# Patient Record
Sex: Male | Born: 1935 | Race: White | Hispanic: No | Smoking: Former smoker
Health system: Southern US, Community
[De-identification: ages and names within clinical notes are randomized; demographics above are authoritative.]

## PROBLEM LIST (undated history)

## (undated) DIAGNOSIS — I998 Other disorder of circulatory system: Secondary | ICD-10-CM

## (undated) DIAGNOSIS — I255 Ischemic cardiomyopathy: Secondary | ICD-10-CM

## (undated) DIAGNOSIS — C3490 Malignant neoplasm of unspecified part of unspecified bronchus or lung: Secondary | ICD-10-CM

## (undated) DIAGNOSIS — C61 Malignant neoplasm of prostate: Secondary | ICD-10-CM

## (undated) DIAGNOSIS — E7800 Pure hypercholesterolemia, unspecified: Secondary | ICD-10-CM

## (undated) DIAGNOSIS — J60 Coalworker's pneumoconiosis: Secondary | ICD-10-CM

## (undated) DIAGNOSIS — I482 Chronic atrial fibrillation, unspecified: Secondary | ICD-10-CM

## (undated) DIAGNOSIS — M1990 Unspecified osteoarthritis, unspecified site: Secondary | ICD-10-CM

## (undated) DIAGNOSIS — I350 Nonrheumatic aortic (valve) stenosis: Secondary | ICD-10-CM

## (undated) DIAGNOSIS — I639 Cerebral infarction, unspecified: Secondary | ICD-10-CM

## (undated) DIAGNOSIS — K922 Gastrointestinal hemorrhage, unspecified: Secondary | ICD-10-CM

## (undated) DIAGNOSIS — I10 Essential (primary) hypertension: Secondary | ICD-10-CM

## (undated) DIAGNOSIS — I6529 Occlusion and stenosis of unspecified carotid artery: Secondary | ICD-10-CM

## (undated) DIAGNOSIS — Z9981 Dependence on supplemental oxygen: Secondary | ICD-10-CM

## (undated) DIAGNOSIS — I2510 Atherosclerotic heart disease of native coronary artery without angina pectoris: Secondary | ICD-10-CM

## (undated) DIAGNOSIS — M109 Gout, unspecified: Secondary | ICD-10-CM

## (undated) DIAGNOSIS — K219 Gastro-esophageal reflux disease without esophagitis: Secondary | ICD-10-CM

## (undated) DIAGNOSIS — R001 Bradycardia, unspecified: Secondary | ICD-10-CM

## (undated) DIAGNOSIS — I5022 Chronic systolic (congestive) heart failure: Secondary | ICD-10-CM

## (undated) DIAGNOSIS — N200 Calculus of kidney: Secondary | ICD-10-CM

## (undated) DIAGNOSIS — J9610 Chronic respiratory failure, unspecified whether with hypoxia or hypercapnia: Secondary | ICD-10-CM

## (undated) DIAGNOSIS — J449 Chronic obstructive pulmonary disease, unspecified: Secondary | ICD-10-CM

## (undated) HISTORY — PX: COLONOSCOPY: SHX174

## (undated) HISTORY — DX: Malignant neoplasm of prostate: C61

## (undated) HISTORY — DX: Occlusion and stenosis of unspecified carotid artery: I65.29

## (undated) HISTORY — PX: INSERT / REPLACE / REMOVE PACEMAKER: SUR710

## (undated) HISTORY — DX: Coalworker's pneumoconiosis: J60

## (undated) HISTORY — DX: Calculus of kidney: N20.0

## (undated) HISTORY — PX: CORONARY ANGIOPLASTY WITH STENT PLACEMENT: SHX49

## (undated) HISTORY — DX: Pure hypercholesterolemia, unspecified: E78.00

## (undated) HISTORY — PX: ESOPHAGOGASTRODUODENOSCOPY: SHX1529

## (undated) HISTORY — PX: CATARACT EXTRACTION W/ INTRAOCULAR LENS  IMPLANT, BILATERAL: SHX1307

---

## 1997-07-19 ENCOUNTER — Observation Stay (HOSPITAL_COMMUNITY): Admission: AD | Admit: 1997-07-19 | Discharge: 1997-07-20 | Payer: Self-pay | Admitting: Cardiology

## 1997-08-26 ENCOUNTER — Other Ambulatory Visit: Admission: RE | Admit: 1997-08-26 | Discharge: 1997-08-26 | Payer: Self-pay | Admitting: Family Medicine

## 1997-09-06 ENCOUNTER — Inpatient Hospital Stay (HOSPITAL_COMMUNITY): Admission: EM | Admit: 1997-09-06 | Discharge: 1997-09-08 | Payer: Self-pay | Admitting: Emergency Medicine

## 1997-09-27 ENCOUNTER — Other Ambulatory Visit: Admission: RE | Admit: 1997-09-27 | Discharge: 1997-09-27 | Payer: Self-pay | Admitting: Family Medicine

## 1998-04-08 ENCOUNTER — Inpatient Hospital Stay (HOSPITAL_COMMUNITY): Admission: EM | Admit: 1998-04-08 | Discharge: 1998-04-12 | Payer: Self-pay | Admitting: *Deleted

## 1998-04-08 ENCOUNTER — Encounter: Payer: Self-pay | Admitting: *Deleted

## 1998-05-02 ENCOUNTER — Ambulatory Visit (HOSPITAL_COMMUNITY): Admission: RE | Admit: 1998-05-02 | Discharge: 1998-05-02 | Payer: Self-pay | Admitting: Cardiology

## 1998-08-16 ENCOUNTER — Ambulatory Visit (HOSPITAL_COMMUNITY): Admission: RE | Admit: 1998-08-16 | Discharge: 1998-08-16 | Payer: Self-pay | Admitting: Internal Medicine

## 1998-08-16 ENCOUNTER — Encounter: Payer: Self-pay | Admitting: Internal Medicine

## 1998-09-05 ENCOUNTER — Ambulatory Visit (HOSPITAL_COMMUNITY): Admission: RE | Admit: 1998-09-05 | Discharge: 1998-09-05 | Payer: Self-pay | Admitting: Family Medicine

## 1998-09-05 ENCOUNTER — Encounter: Payer: Self-pay | Admitting: Family Medicine

## 1998-09-15 ENCOUNTER — Ambulatory Visit (HOSPITAL_COMMUNITY): Admission: RE | Admit: 1998-09-15 | Discharge: 1998-09-15 | Payer: Self-pay | Admitting: Family Medicine

## 1998-10-03 ENCOUNTER — Ambulatory Visit (HOSPITAL_COMMUNITY): Admission: RE | Admit: 1998-10-03 | Discharge: 1998-10-03 | Payer: Self-pay | Admitting: Family Medicine

## 1998-10-03 ENCOUNTER — Encounter: Payer: Self-pay | Admitting: Family Medicine

## 1998-12-04 ENCOUNTER — Emergency Department (HOSPITAL_COMMUNITY): Admission: EM | Admit: 1998-12-04 | Discharge: 1998-12-04 | Payer: Self-pay | Admitting: *Deleted

## 1998-12-04 ENCOUNTER — Encounter: Payer: Self-pay | Admitting: *Deleted

## 1998-12-05 ENCOUNTER — Encounter: Payer: Self-pay | Admitting: *Deleted

## 1999-02-05 ENCOUNTER — Inpatient Hospital Stay (HOSPITAL_COMMUNITY): Admission: EM | Admit: 1999-02-05 | Discharge: 1999-02-07 | Payer: Self-pay | Admitting: Emergency Medicine

## 1999-02-05 ENCOUNTER — Encounter: Payer: Self-pay | Admitting: *Deleted

## 1999-04-12 ENCOUNTER — Encounter: Payer: Self-pay | Admitting: Emergency Medicine

## 1999-04-13 ENCOUNTER — Observation Stay (HOSPITAL_COMMUNITY): Admission: EM | Admit: 1999-04-13 | Discharge: 1999-04-13 | Payer: Self-pay | Admitting: Emergency Medicine

## 1999-04-18 ENCOUNTER — Encounter: Admission: RE | Admit: 1999-04-18 | Discharge: 1999-04-18 | Payer: Self-pay | Admitting: Family Medicine

## 1999-05-16 ENCOUNTER — Encounter: Payer: Self-pay | Admitting: Internal Medicine

## 1999-05-16 ENCOUNTER — Ambulatory Visit (HOSPITAL_COMMUNITY): Admission: RE | Admit: 1999-05-16 | Discharge: 1999-05-16 | Payer: Self-pay | Admitting: Internal Medicine

## 1999-09-29 ENCOUNTER — Encounter: Payer: Self-pay | Admitting: Emergency Medicine

## 1999-09-29 ENCOUNTER — Emergency Department (HOSPITAL_COMMUNITY): Admission: EM | Admit: 1999-09-29 | Discharge: 1999-09-29 | Payer: Self-pay | Admitting: Emergency Medicine

## 1999-10-24 ENCOUNTER — Ambulatory Visit (HOSPITAL_COMMUNITY): Admission: RE | Admit: 1999-10-24 | Discharge: 1999-10-25 | Payer: Self-pay | Admitting: Cardiology

## 1999-12-15 ENCOUNTER — Ambulatory Visit (HOSPITAL_COMMUNITY): Admission: RE | Admit: 1999-12-15 | Discharge: 1999-12-15 | Payer: Self-pay | Admitting: *Deleted

## 2000-01-08 ENCOUNTER — Ambulatory Visit (HOSPITAL_COMMUNITY): Admission: RE | Admit: 2000-01-08 | Discharge: 2000-01-08 | Payer: Self-pay | Admitting: Gastroenterology

## 2000-01-08 ENCOUNTER — Encounter (INDEPENDENT_AMBULATORY_CARE_PROVIDER_SITE_OTHER): Payer: Self-pay

## 2000-01-20 ENCOUNTER — Encounter: Payer: Self-pay | Admitting: Emergency Medicine

## 2000-01-20 ENCOUNTER — Inpatient Hospital Stay (HOSPITAL_COMMUNITY): Admission: EM | Admit: 2000-01-20 | Discharge: 2000-01-24 | Payer: Self-pay | Admitting: Emergency Medicine

## 2000-05-29 ENCOUNTER — Encounter: Payer: Self-pay | Admitting: Cardiology

## 2000-05-29 ENCOUNTER — Inpatient Hospital Stay (HOSPITAL_COMMUNITY): Admission: EM | Admit: 2000-05-29 | Discharge: 2000-05-31 | Payer: Self-pay | Admitting: Emergency Medicine

## 2000-09-09 DIAGNOSIS — N200 Calculus of kidney: Secondary | ICD-10-CM

## 2000-09-09 HISTORY — DX: Calculus of kidney: N20.0

## 2000-09-17 ENCOUNTER — Encounter: Payer: Self-pay | Admitting: Emergency Medicine

## 2000-09-17 ENCOUNTER — Emergency Department (HOSPITAL_COMMUNITY): Admission: EM | Admit: 2000-09-17 | Discharge: 2000-09-17 | Payer: Self-pay | Admitting: Emergency Medicine

## 2001-02-08 ENCOUNTER — Inpatient Hospital Stay (HOSPITAL_COMMUNITY): Admission: EM | Admit: 2001-02-08 | Discharge: 2001-02-09 | Payer: Self-pay

## 2001-03-03 ENCOUNTER — Encounter: Payer: Self-pay | Admitting: Emergency Medicine

## 2001-03-03 ENCOUNTER — Emergency Department (HOSPITAL_COMMUNITY): Admission: EM | Admit: 2001-03-03 | Discharge: 2001-03-03 | Payer: Self-pay | Admitting: Emergency Medicine

## 2001-03-15 ENCOUNTER — Emergency Department (HOSPITAL_COMMUNITY): Admission: EM | Admit: 2001-03-15 | Discharge: 2001-03-15 | Payer: Self-pay | Admitting: Emergency Medicine

## 2001-03-15 ENCOUNTER — Encounter: Payer: Self-pay | Admitting: *Deleted

## 2001-04-29 ENCOUNTER — Emergency Department (HOSPITAL_COMMUNITY): Admission: EM | Admit: 2001-04-29 | Discharge: 2001-04-29 | Payer: Self-pay

## 2001-09-30 ENCOUNTER — Ambulatory Visit (HOSPITAL_COMMUNITY): Admission: RE | Admit: 2001-09-30 | Discharge: 2001-09-30 | Payer: Self-pay

## 2002-04-07 ENCOUNTER — Encounter: Payer: Self-pay | Admitting: Emergency Medicine

## 2002-04-07 ENCOUNTER — Inpatient Hospital Stay (HOSPITAL_COMMUNITY): Admission: EM | Admit: 2002-04-07 | Discharge: 2002-04-09 | Payer: Self-pay | Admitting: Emergency Medicine

## 2002-04-08 ENCOUNTER — Encounter (INDEPENDENT_AMBULATORY_CARE_PROVIDER_SITE_OTHER): Payer: Self-pay | Admitting: Cardiology

## 2002-07-11 HISTORY — PX: CORONARY ARTERY BYPASS GRAFT: SHX141

## 2002-08-02 ENCOUNTER — Inpatient Hospital Stay (HOSPITAL_COMMUNITY): Admission: EM | Admit: 2002-08-02 | Discharge: 2002-08-11 | Payer: Self-pay

## 2002-08-04 ENCOUNTER — Encounter: Payer: Self-pay | Admitting: Cardiology

## 2002-08-05 ENCOUNTER — Encounter: Payer: Self-pay | Admitting: Cardiothoracic Surgery

## 2002-08-06 ENCOUNTER — Encounter: Payer: Self-pay | Admitting: Cardiothoracic Surgery

## 2002-08-07 ENCOUNTER — Encounter: Payer: Self-pay | Admitting: Cardiothoracic Surgery

## 2002-08-08 ENCOUNTER — Encounter: Payer: Self-pay | Admitting: Cardiothoracic Surgery

## 2002-08-11 ENCOUNTER — Encounter: Payer: Self-pay | Admitting: Cardiothoracic Surgery

## 2002-08-11 HISTORY — PX: INCISION AND DRAINAGE OF WOUND: SHX1803

## 2002-08-13 ENCOUNTER — Emergency Department (HOSPITAL_COMMUNITY): Admission: EM | Admit: 2002-08-13 | Discharge: 2002-08-13 | Payer: Self-pay | Admitting: Emergency Medicine

## 2002-08-27 ENCOUNTER — Encounter: Payer: Self-pay | Admitting: Emergency Medicine

## 2002-08-27 ENCOUNTER — Inpatient Hospital Stay (HOSPITAL_COMMUNITY): Admission: EM | Admit: 2002-08-27 | Discharge: 2002-09-08 | Payer: Self-pay | Admitting: Emergency Medicine

## 2002-08-27 ENCOUNTER — Encounter: Payer: Self-pay | Admitting: Cardiothoracic Surgery

## 2002-08-28 ENCOUNTER — Encounter: Payer: Self-pay | Admitting: Cardiothoracic Surgery

## 2002-08-29 ENCOUNTER — Encounter: Payer: Self-pay | Admitting: Cardiothoracic Surgery

## 2002-08-30 ENCOUNTER — Encounter: Payer: Self-pay | Admitting: Cardiothoracic Surgery

## 2002-10-12 ENCOUNTER — Encounter (HOSPITAL_COMMUNITY): Admission: RE | Admit: 2002-10-12 | Discharge: 2002-12-10 | Payer: Self-pay | Admitting: Emergency Medicine

## 2002-10-22 ENCOUNTER — Emergency Department (HOSPITAL_COMMUNITY): Admission: EM | Admit: 2002-10-22 | Discharge: 2002-10-23 | Payer: Self-pay

## 2002-12-24 ENCOUNTER — Observation Stay (HOSPITAL_COMMUNITY): Admission: EM | Admit: 2002-12-24 | Discharge: 2002-12-25 | Payer: Self-pay | Admitting: Emergency Medicine

## 2002-12-24 ENCOUNTER — Encounter: Payer: Self-pay | Admitting: Emergency Medicine

## 2003-02-02 ENCOUNTER — Emergency Department (HOSPITAL_COMMUNITY): Admission: EM | Admit: 2003-02-02 | Discharge: 2003-02-03 | Payer: Self-pay | Admitting: Emergency Medicine

## 2003-02-13 ENCOUNTER — Emergency Department (HOSPITAL_COMMUNITY): Admission: EM | Admit: 2003-02-13 | Discharge: 2003-02-14 | Payer: Self-pay | Admitting: Emergency Medicine

## 2003-03-26 ENCOUNTER — Encounter (INDEPENDENT_AMBULATORY_CARE_PROVIDER_SITE_OTHER): Payer: Self-pay | Admitting: *Deleted

## 2003-03-26 ENCOUNTER — Ambulatory Visit (HOSPITAL_COMMUNITY): Admission: RE | Admit: 2003-03-26 | Discharge: 2003-03-26 | Payer: Self-pay | Admitting: *Deleted

## 2003-03-29 ENCOUNTER — Emergency Department (HOSPITAL_COMMUNITY): Admission: EM | Admit: 2003-03-29 | Discharge: 2003-03-29 | Payer: Self-pay | Admitting: Emergency Medicine

## 2003-05-21 ENCOUNTER — Emergency Department (HOSPITAL_COMMUNITY): Admission: EM | Admit: 2003-05-21 | Discharge: 2003-05-22 | Payer: Self-pay | Admitting: Emergency Medicine

## 2003-05-25 ENCOUNTER — Emergency Department (HOSPITAL_COMMUNITY): Admission: EM | Admit: 2003-05-25 | Discharge: 2003-05-26 | Payer: Self-pay | Admitting: Emergency Medicine

## 2003-08-18 ENCOUNTER — Inpatient Hospital Stay (HOSPITAL_COMMUNITY): Admission: EM | Admit: 2003-08-18 | Discharge: 2003-08-19 | Payer: Self-pay

## 2003-09-13 ENCOUNTER — Inpatient Hospital Stay (HOSPITAL_COMMUNITY): Admission: EM | Admit: 2003-09-13 | Discharge: 2003-09-16 | Payer: Self-pay | Admitting: Emergency Medicine

## 2003-10-25 ENCOUNTER — Emergency Department (HOSPITAL_COMMUNITY): Admission: EM | Admit: 2003-10-25 | Discharge: 2003-10-26 | Payer: Self-pay | Admitting: *Deleted

## 2003-12-04 ENCOUNTER — Emergency Department (HOSPITAL_COMMUNITY): Admission: EM | Admit: 2003-12-04 | Discharge: 2003-12-05 | Payer: Self-pay | Admitting: Emergency Medicine

## 2004-02-05 ENCOUNTER — Emergency Department (HOSPITAL_COMMUNITY): Admission: EM | Admit: 2004-02-05 | Discharge: 2004-02-06 | Payer: Self-pay | Admitting: Emergency Medicine

## 2004-04-14 ENCOUNTER — Ambulatory Visit: Payer: Self-pay | Admitting: Family Medicine

## 2004-05-04 ENCOUNTER — Ambulatory Visit: Payer: Self-pay

## 2004-05-14 ENCOUNTER — Inpatient Hospital Stay (HOSPITAL_COMMUNITY): Admission: EM | Admit: 2004-05-14 | Discharge: 2004-05-17 | Payer: Self-pay | Admitting: Emergency Medicine

## 2004-07-26 ENCOUNTER — Emergency Department (HOSPITAL_COMMUNITY): Admission: EM | Admit: 2004-07-26 | Discharge: 2004-07-26 | Payer: Self-pay | Admitting: Emergency Medicine

## 2004-08-22 ENCOUNTER — Ambulatory Visit: Payer: Self-pay | Admitting: Internal Medicine

## 2004-09-02 IMAGING — CT CT PELVIS W/O CM
1 series · 15 of 32 positions shown, 19 images · non-contrast
Comparison: none

CLINICAL DATA: Abdominal pain.
CT ABDOMEN WITHOUT CONTRAST
No prior studies.
TECHNIQUE: Contiguous axial CT images were obtained from the adrenal glands through the iliac crests.

[Series 2: renal stone · axial · 0.85mm/px · z∈[-455,-90]mm · 15 of 82 slices shown, 19 images]
[im 6/82  soft-tissue]
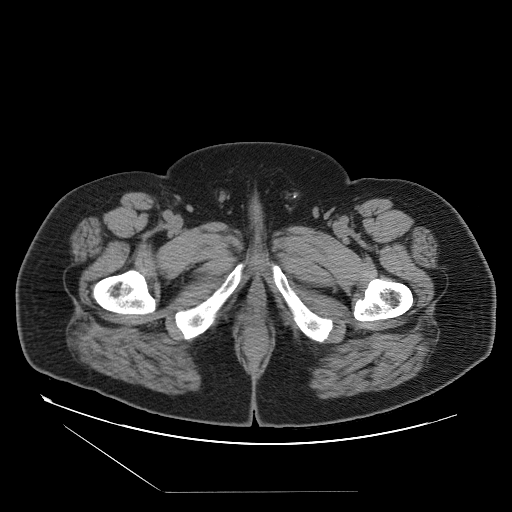
[im 6/82  bone]
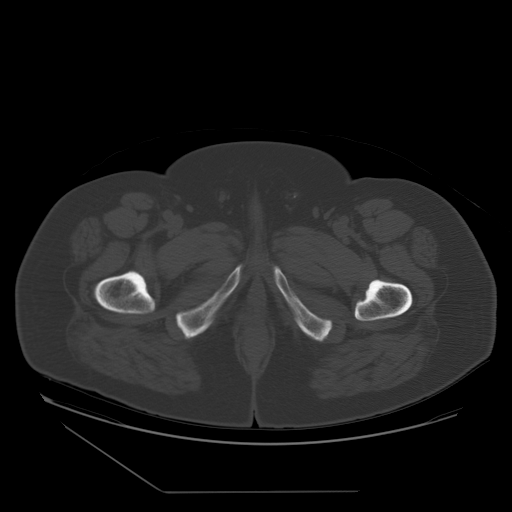
[im 11/82  soft-tissue]
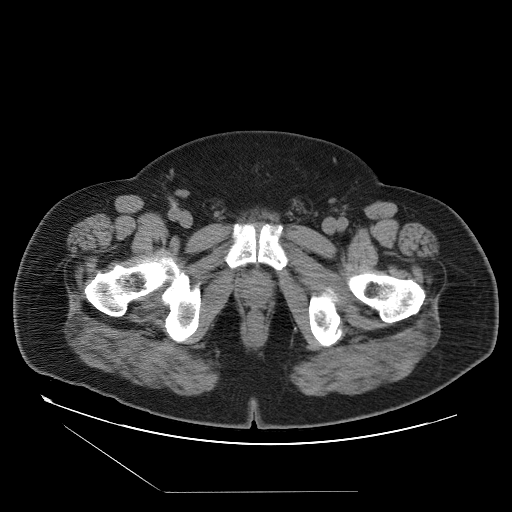
[im 16/82  soft-tissue]
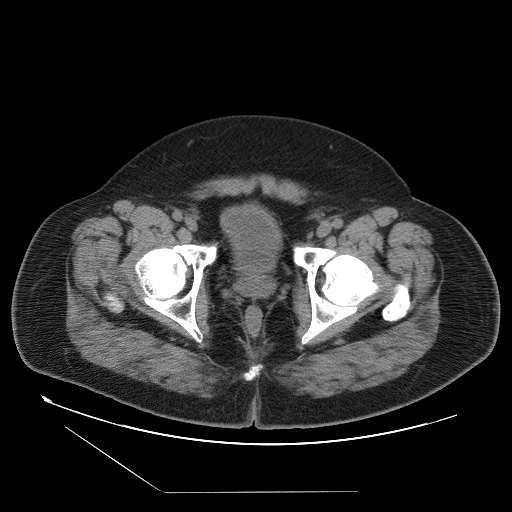
[im 24/82  soft-tissue]
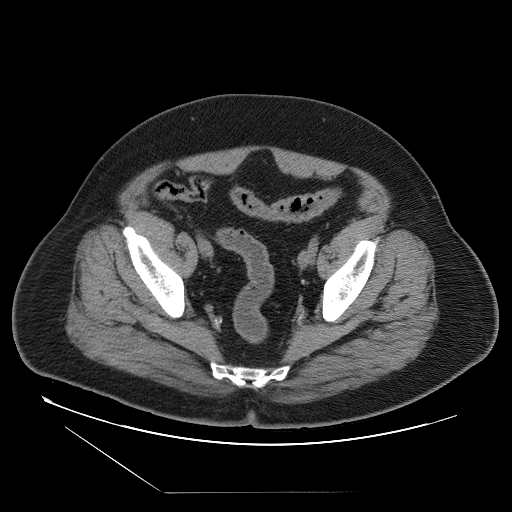
[im 29/82  soft-tissue]
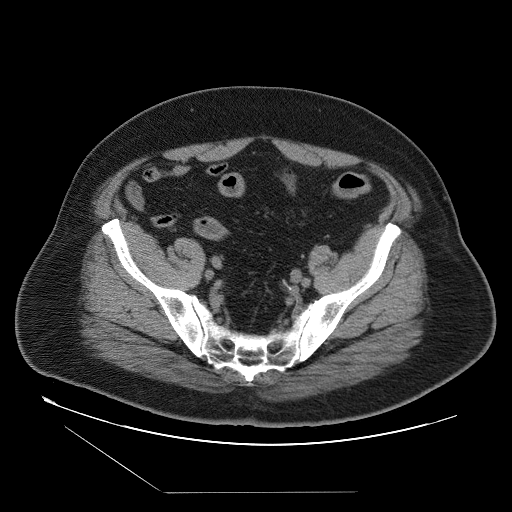
[im 34/82  soft-tissue]
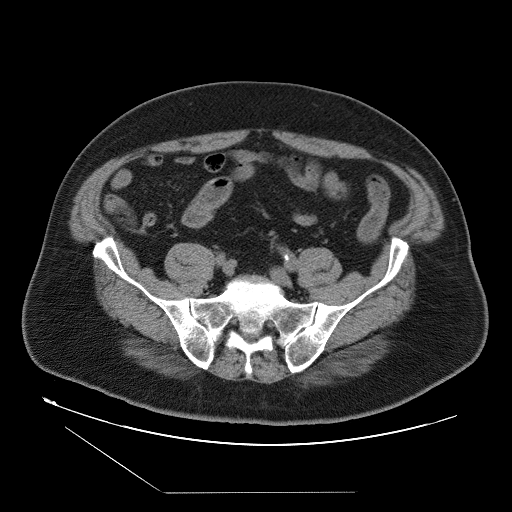
[im 42/82  soft-tissue]
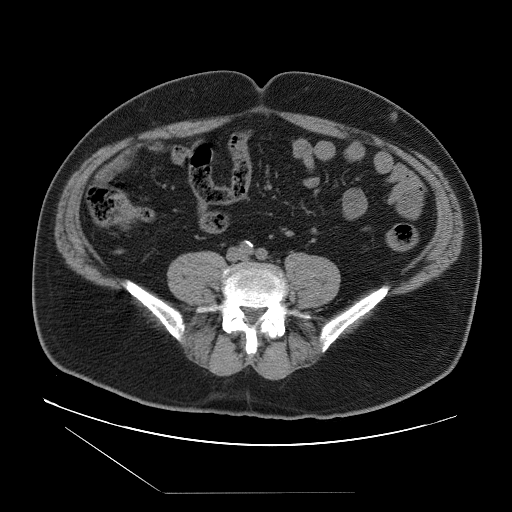
[im 48/82  soft-tissue]
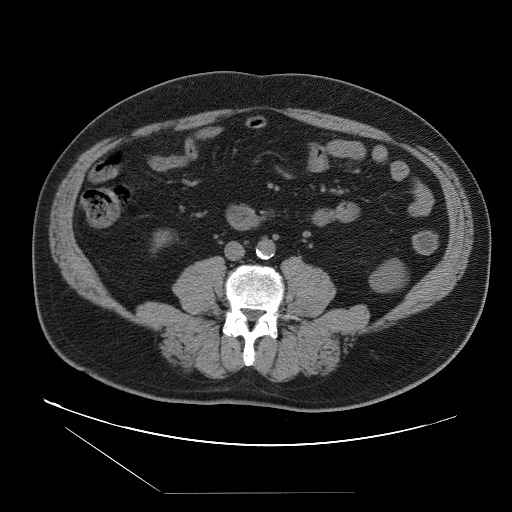
[im 53/82  soft-tissue]
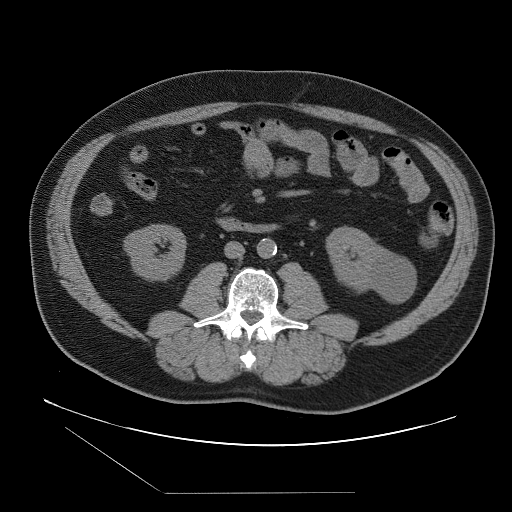
[im 53/82  bone]
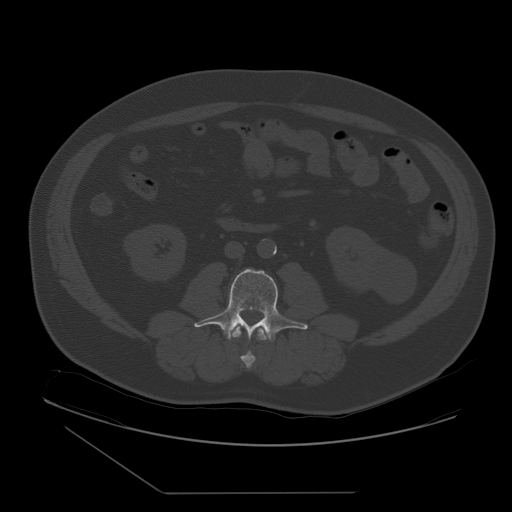
[im 58/82  soft-tissue]
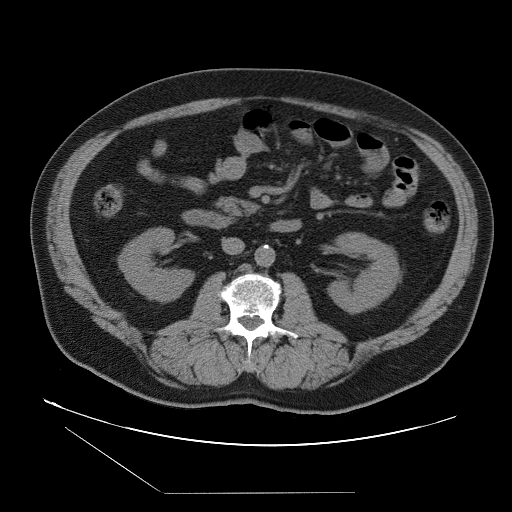
[im 66/82  soft-tissue]
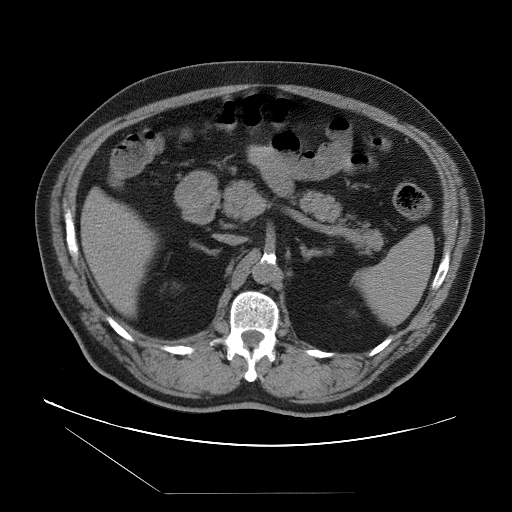
[im 71/82  soft-tissue]
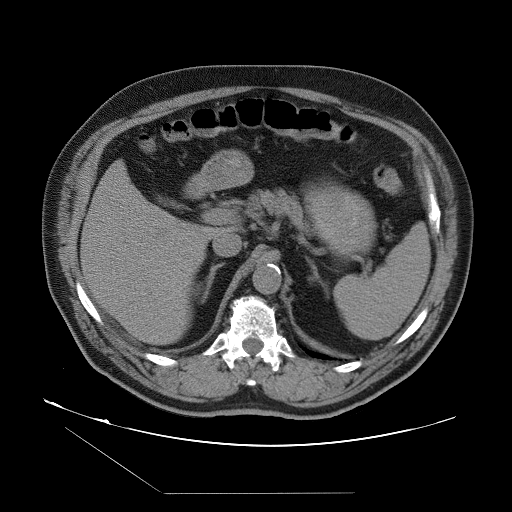
[im 71/82  lung]
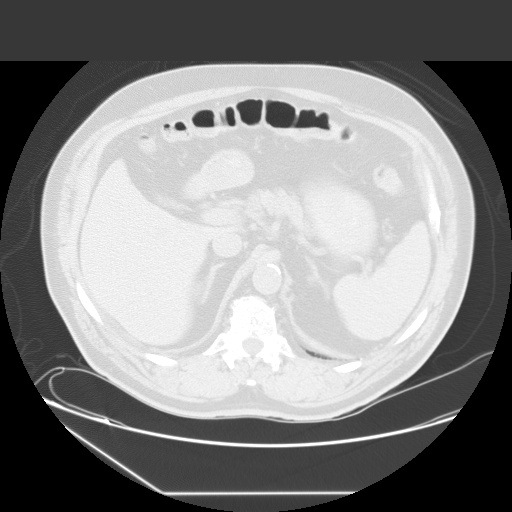
[im 74/82  lung]
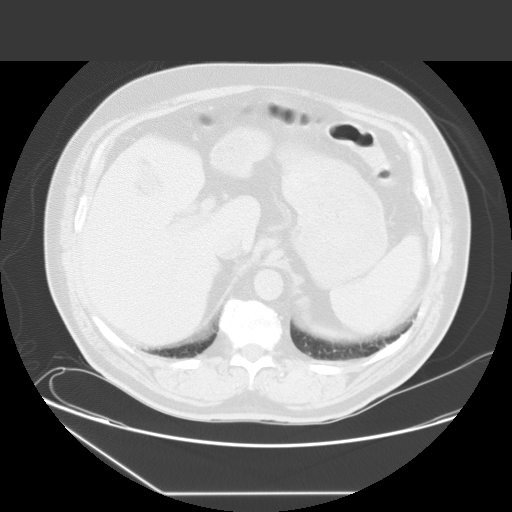
[im 76/82  soft-tissue]
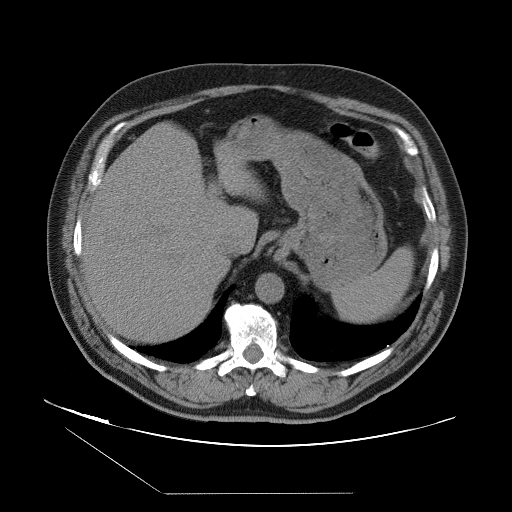
[im 76/82  lung]
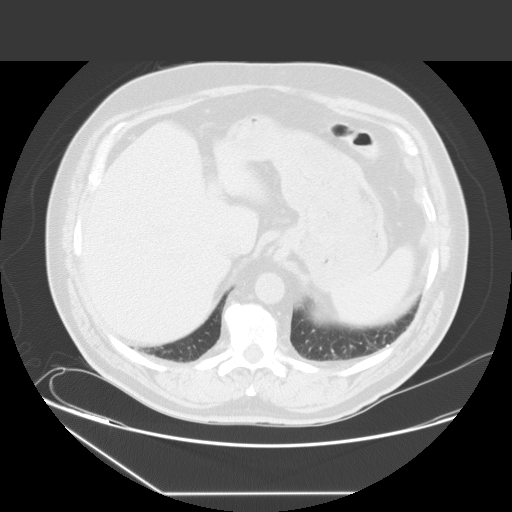
[im 79/82  lung]
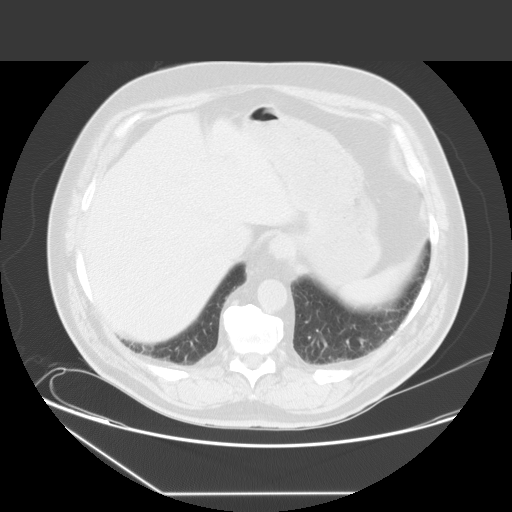

[15 of 32 positions shown; findings below may reference images not displayed]

FINDINGS: There is evidence of old granulomatous disease in the lungs.  The gallbladder appears contracted, likely from recent meal.  Adrenal glands appear unremarkable.  Noncontrast CT appearance of the pancreas is normal. 
There is a 4 mm right midkidney nonobstructive renal calculus.  Right ureter appears normal.  No hydronephrosis.
An exophytic lesion from the left kidney lower pole measures 23 Hounsfield units in density and 4.1 cm in diameter.  Thus, the lesion extending from the left kidney lower pole is considered likely to represent a complex cyst.  We have not characterized its enhancement pattern.  This may merit followup uncontrasted CT or ultrasound. 
IMPRESSION
1.  Exophytic lesion from the left kidney lower pole is likely a cyst but has not been fully characterized. 
2.  Old granulomatous disease.
3.  Right nephrolithiasis nonobstructive.  No evidence of hydronephrosis or hydroureter.
CT PELVIS WITHOUT CONTRAST
Contiguous axial CT images were obtained from the iliac crests to the proximal femora.
FINDINGS: The appendix and terminal ileum appear normal as does the cecum.  No ureteral calculus or hydroureter.  The visualized bowel appears unremarkable.
IMPRESSION
1.  Unremarkable CT appearance of the pelvis.

## 2004-10-02 ENCOUNTER — Emergency Department (HOSPITAL_COMMUNITY): Admission: EM | Admit: 2004-10-02 | Discharge: 2004-10-03 | Payer: Self-pay | Admitting: Emergency Medicine

## 2004-10-18 ENCOUNTER — Emergency Department (HOSPITAL_COMMUNITY): Admission: EM | Admit: 2004-10-18 | Discharge: 2004-10-18 | Payer: Self-pay | Admitting: Emergency Medicine

## 2004-12-05 ENCOUNTER — Ambulatory Visit (HOSPITAL_COMMUNITY): Admission: RE | Admit: 2004-12-05 | Discharge: 2004-12-05 | Payer: Self-pay | Admitting: *Deleted

## 2004-12-05 ENCOUNTER — Encounter (INDEPENDENT_AMBULATORY_CARE_PROVIDER_SITE_OTHER): Payer: Self-pay | Admitting: *Deleted

## 2004-12-10 HISTORY — PX: INSERT / REPLACE / REMOVE PACEMAKER: SUR710

## 2004-12-18 ENCOUNTER — Ambulatory Visit: Payer: Self-pay | Admitting: Internal Medicine

## 2004-12-24 IMAGING — US US ABDOMEN COMPLETE
1 series · 14 of 25 positions shown · non-contrast
Comparison: none

CLINICAL DATA: 70-year-old with abdominal pain.
 ULTRASOUND ABDOMEN COMPLETE 
 The liver demonstrates normal echogenicity without focal lesions or intrahepatic ductal dilatation.  The common bile duct measures 4.2 mm which is within normal limits.  The gallbladder is contracted.  No definite gallstones.  The pancreas is not well-visualized due to overlying bowel gas.  The spleen is normal in size measuring 11 cm.  The right kidney measures 11.3 cm.  The left kidney measures 11.2 cm.  There is a 4.6 x 3.9 x 4.2 cm simple-appearing cyst associated with the lower pole region of the left kidney.  The abdominal aorta was not completely visualized, but no definite aneurysm is seen.  
 IMPRESSION
 1.  Contracted gallbladder, but no definite gallstones.
 2.  Simple-appearing cyst associated with the left kidney. 
 3.  Limited visualization of the pancreas. 
 4.  Normal caliber common bile duct.

[Series 1: unknown · 0.26mm/px · 14 of 50 slices shown]
[im 1/50]
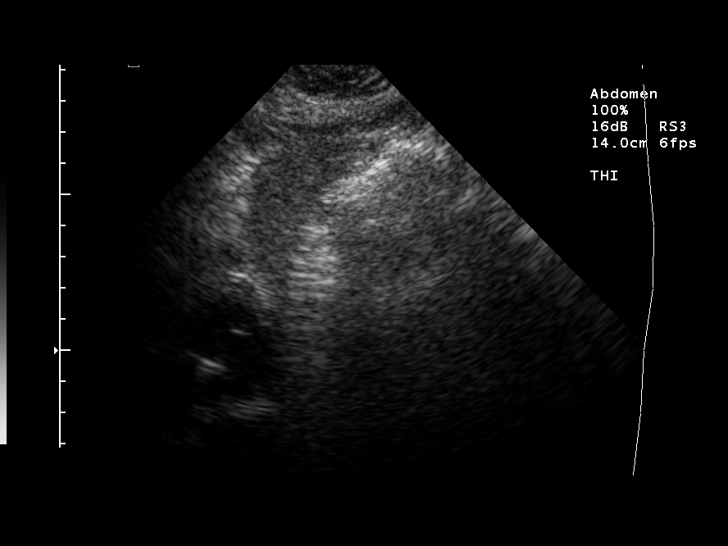
[im 5/50]
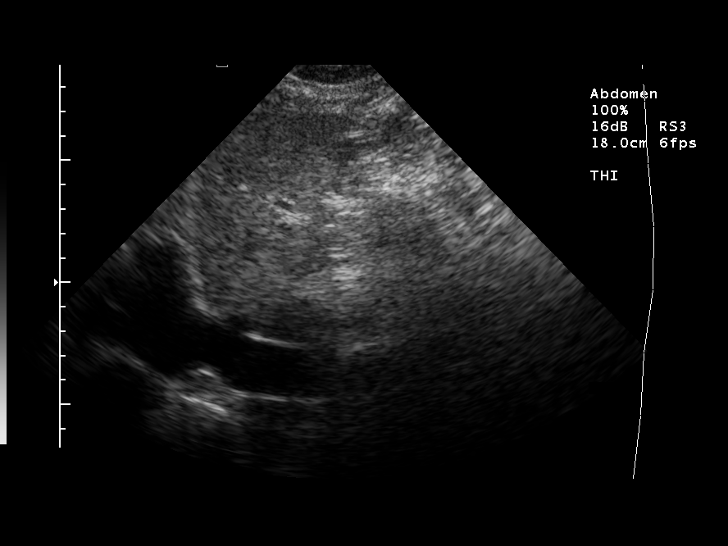
[im 9/50]
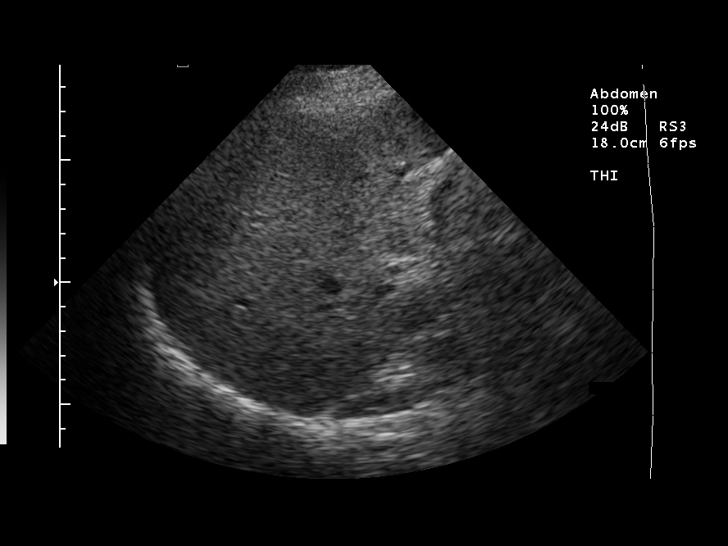
[im 13/50]
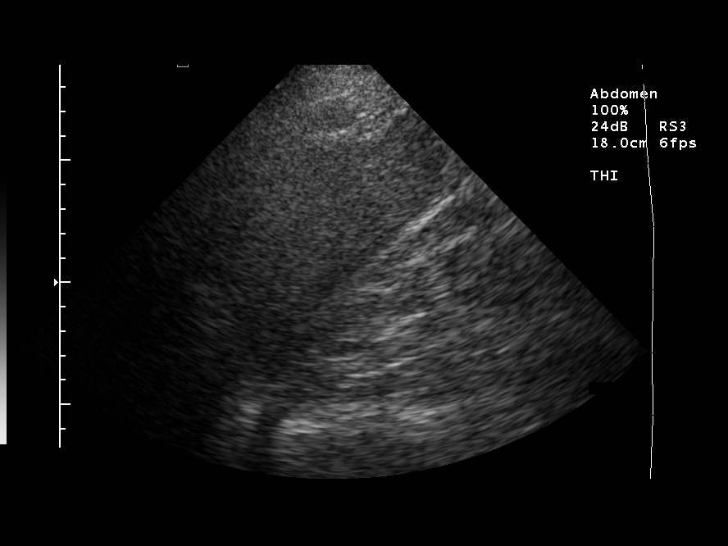
[im 17/50]
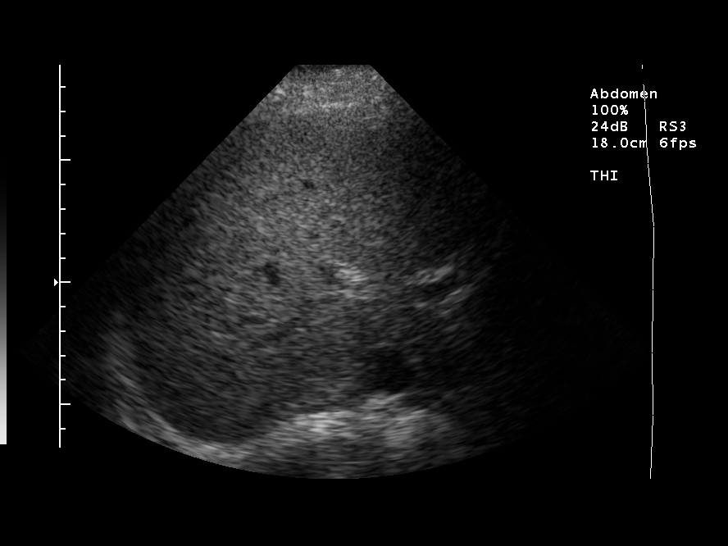
[im 19/50]
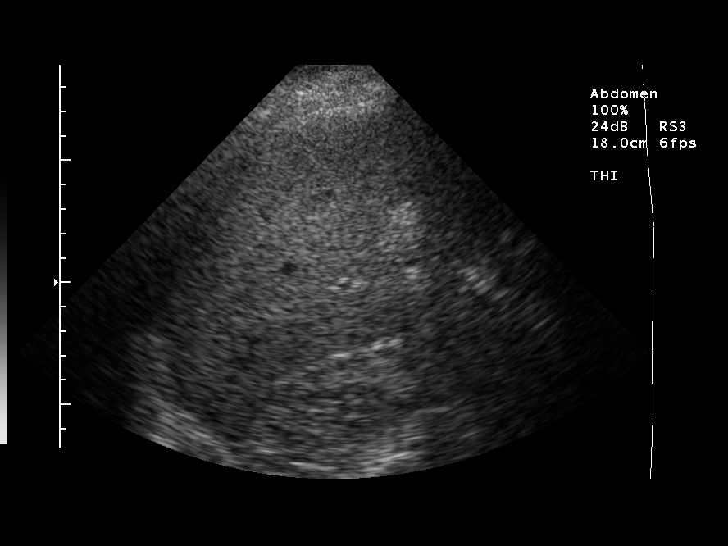
[im 23/50]
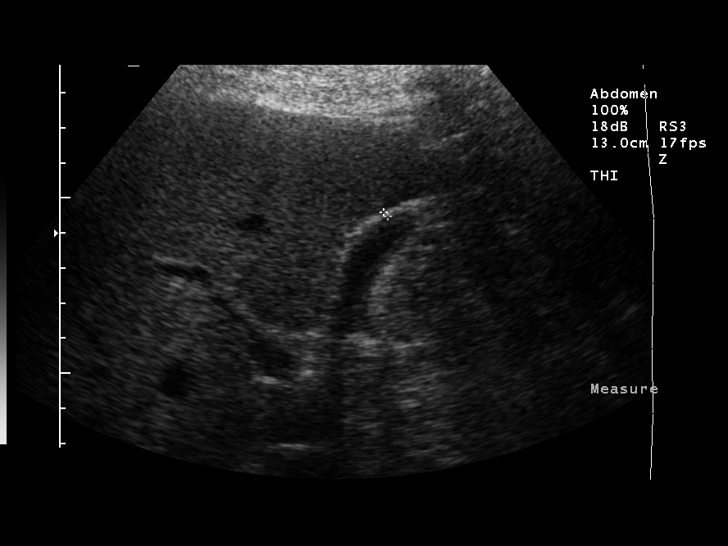
[im 27/50]
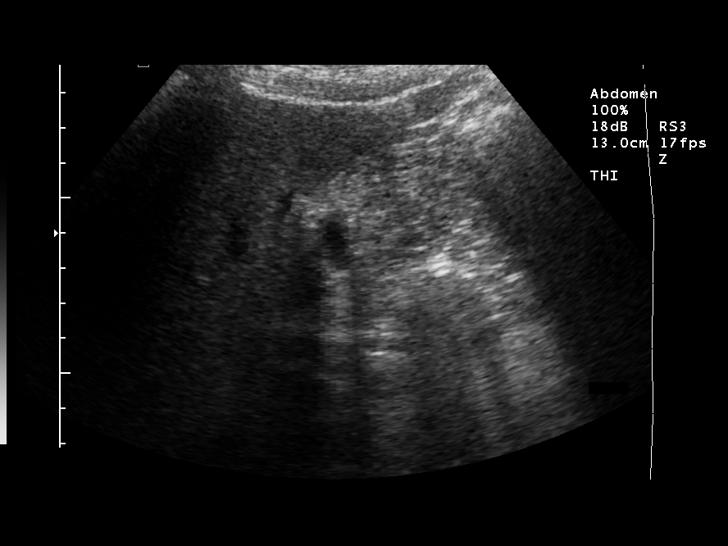
[im 31/50]
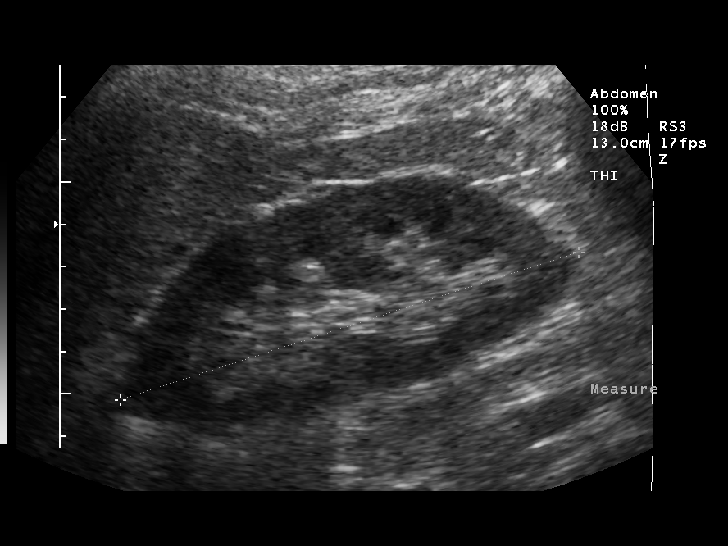
[im 33/50]
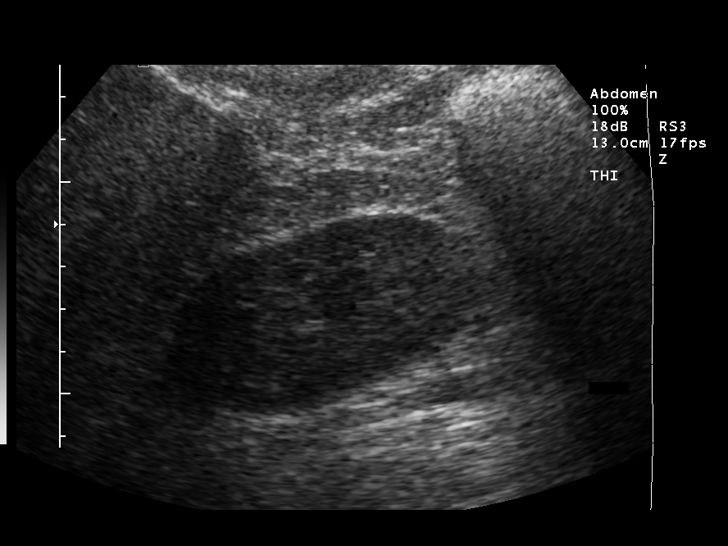
[im 37/50]
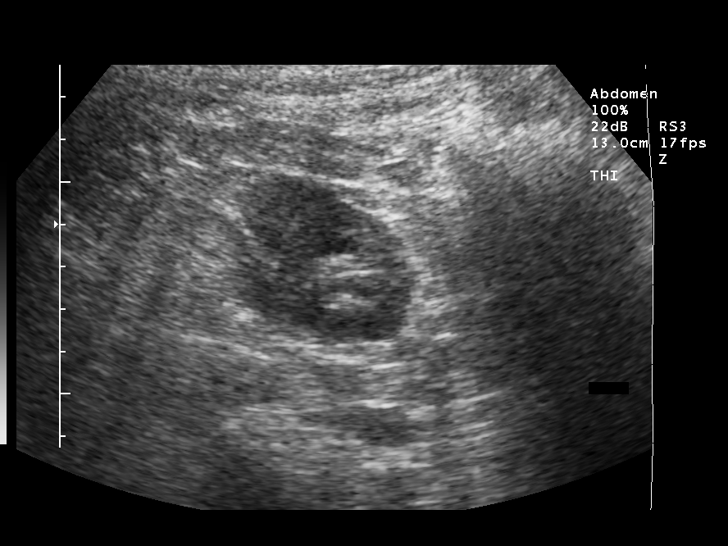
[im 41/50]
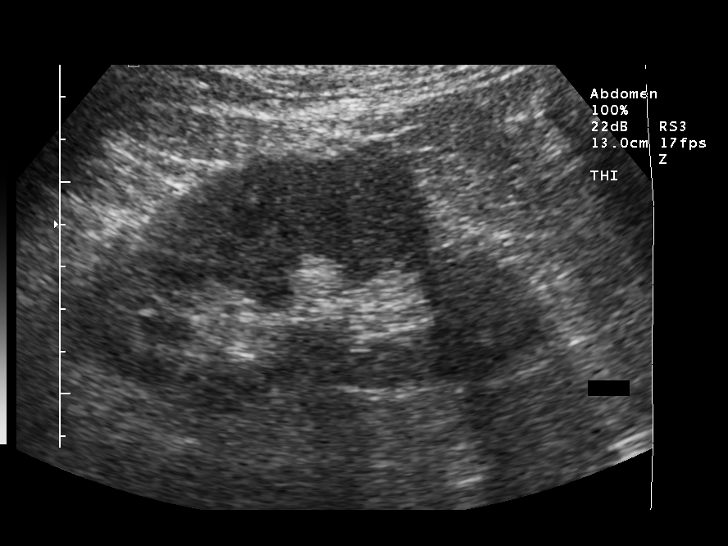
[im 45/50]
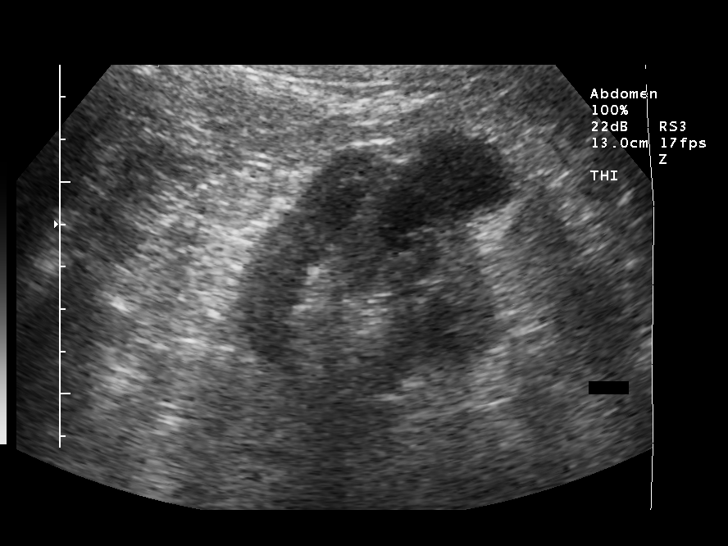
[im 50/50]
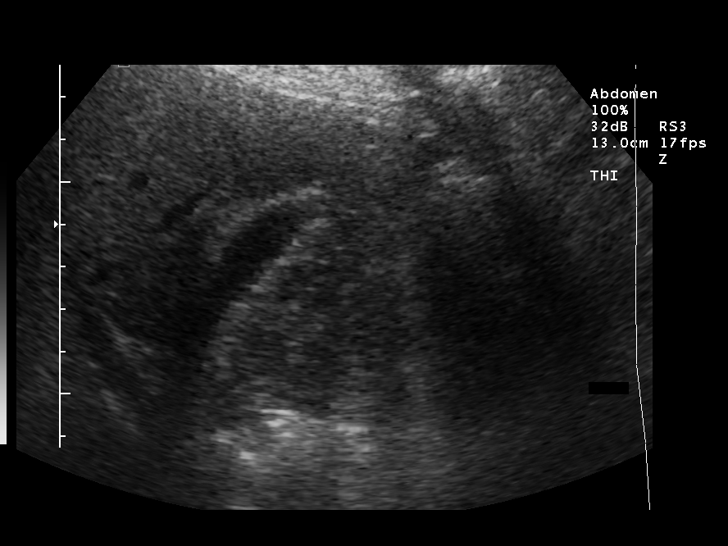

[14 of 25 positions shown; findings below may reference images not displayed]

## 2004-12-28 ENCOUNTER — Ambulatory Visit (HOSPITAL_COMMUNITY): Admission: RE | Admit: 2004-12-28 | Discharge: 2004-12-28 | Payer: Self-pay | Admitting: *Deleted

## 2005-01-22 ENCOUNTER — Ambulatory Visit: Payer: Self-pay | Admitting: Internal Medicine

## 2005-02-17 ENCOUNTER — Inpatient Hospital Stay (HOSPITAL_COMMUNITY): Admission: EM | Admit: 2005-02-17 | Discharge: 2005-02-21 | Payer: Self-pay | Admitting: Emergency Medicine

## 2005-03-18 IMAGING — CR DG CHEST 1V PORT
1 series · 1 of 1 positions shown · non-contrast
Comparison: none

CLINICAL DATA: Chest pain, dyspnea, and dizziness.
 PORTABLE CHEST - 08/18/03 
 Exam at 7179 hours.
 No comparison studies are available.
 There is a right subclavian transvenous pacemaker with its lead overlying the right ventricular apex.  The heart is mildly enlarged status post median sternotomy and CABG.  No edema, confluent air space opacity, or pleural effusion is present.  There is mild vascular congestion.  Osseous structures appear unremarkable for age.
 IMPRESSION
 Cardiomegaly and vascular congestion.  Pacemaker lead appears satisfactorily positioned.

[view not recorded]
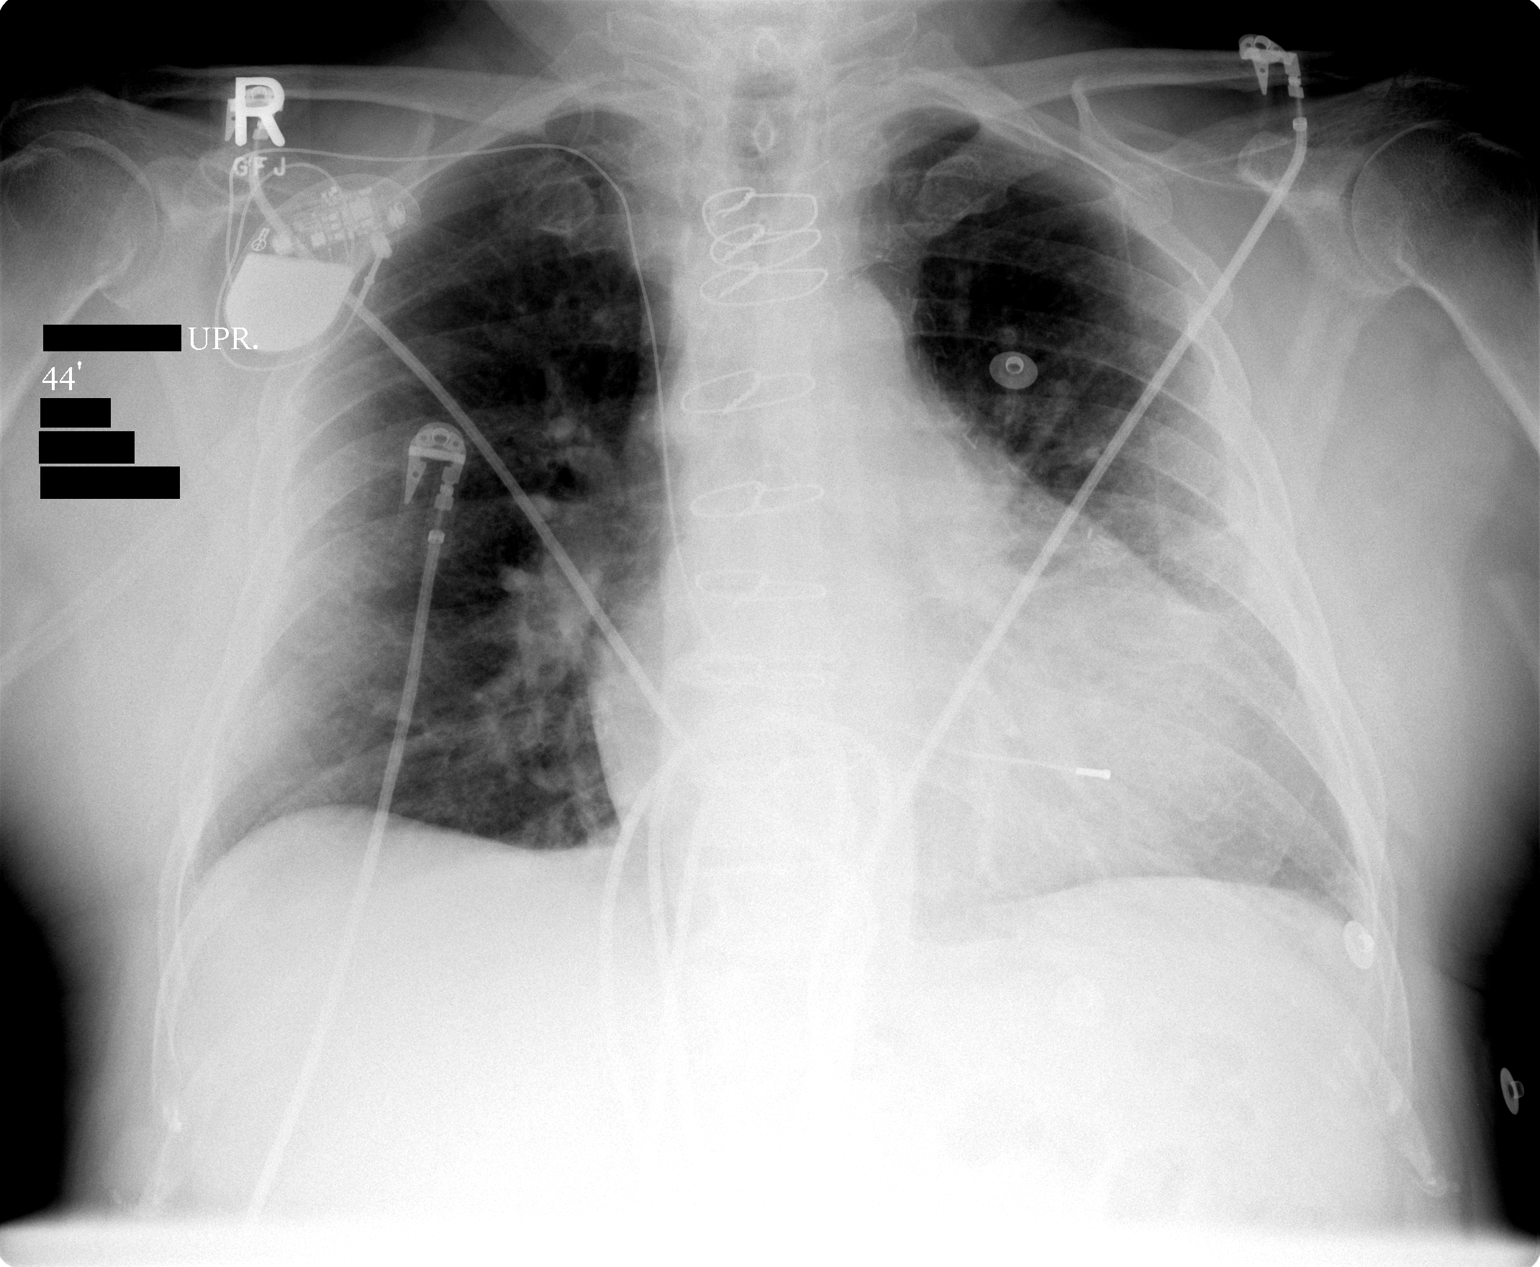

[1 of 1 positions shown; findings below may reference images not displayed]

## 2005-04-13 IMAGING — CR DG CHEST 1V PORT
1 series · 1 of 1 positions shown · non-contrast
Comparison: 08/18/03.

CLINICAL DATA: Syncope.  Dyspnea.  
 PORTABLE CHEST ([DATE] HOURS)

[view not recorded]
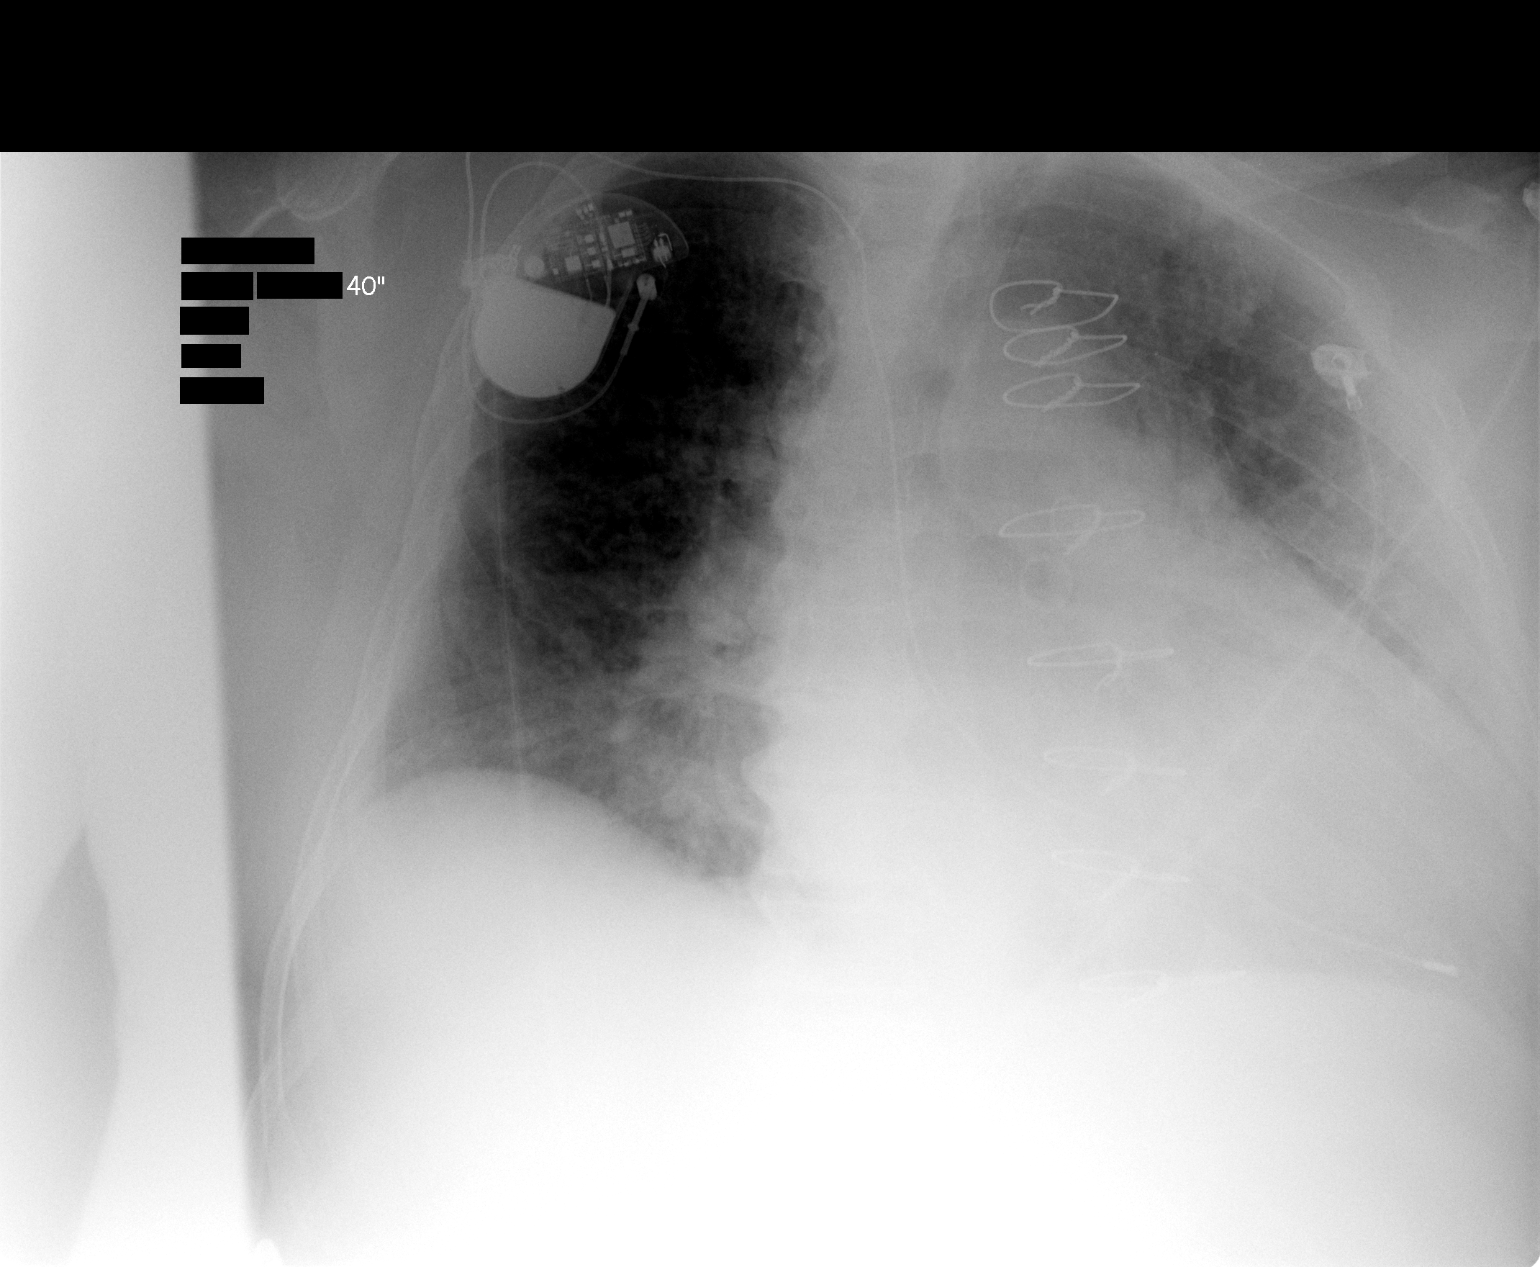

[1 of 1 positions shown; findings below may reference images not displayed]

There is patient rotation to the left.  Allowing for this, the cardiomediastinal contours appear stable with mild cardiac enlargement status-post CABG.  Right subclavian transvenous pacemaker lead is in stable position.  There is new mild pulmonary edema, but no confluent air space opacity or significant pleural effusion.
 IMPRESSION
 Mild congestive heart failure.

## 2005-04-16 IMAGING — CR DG CHEST 2V
2 series · 2 of 2 positions shown · non-contrast
Comparison: none

CLINICAL DATA: Syncope; AICD placement
 CHEST
 Two views of the chest are compared to a portable film from 09/13/03 and prior film of 08/18/03.  A permanent pacemaker overlying the right upper chest is unchanged with a single lead.  A new pacer now overlies the left upper chest with AICD lead present.  No pneumothorax is seen.  There is cardiomegaly and mild pulmonary vascular congestion present.  
 IMPRESSION
 1.  New pacer with AICD lead.  Prior pacer over right chest remains.
 2.  Stable cardiomegaly with mild congestion.

[view not recorded (1 of 2)]
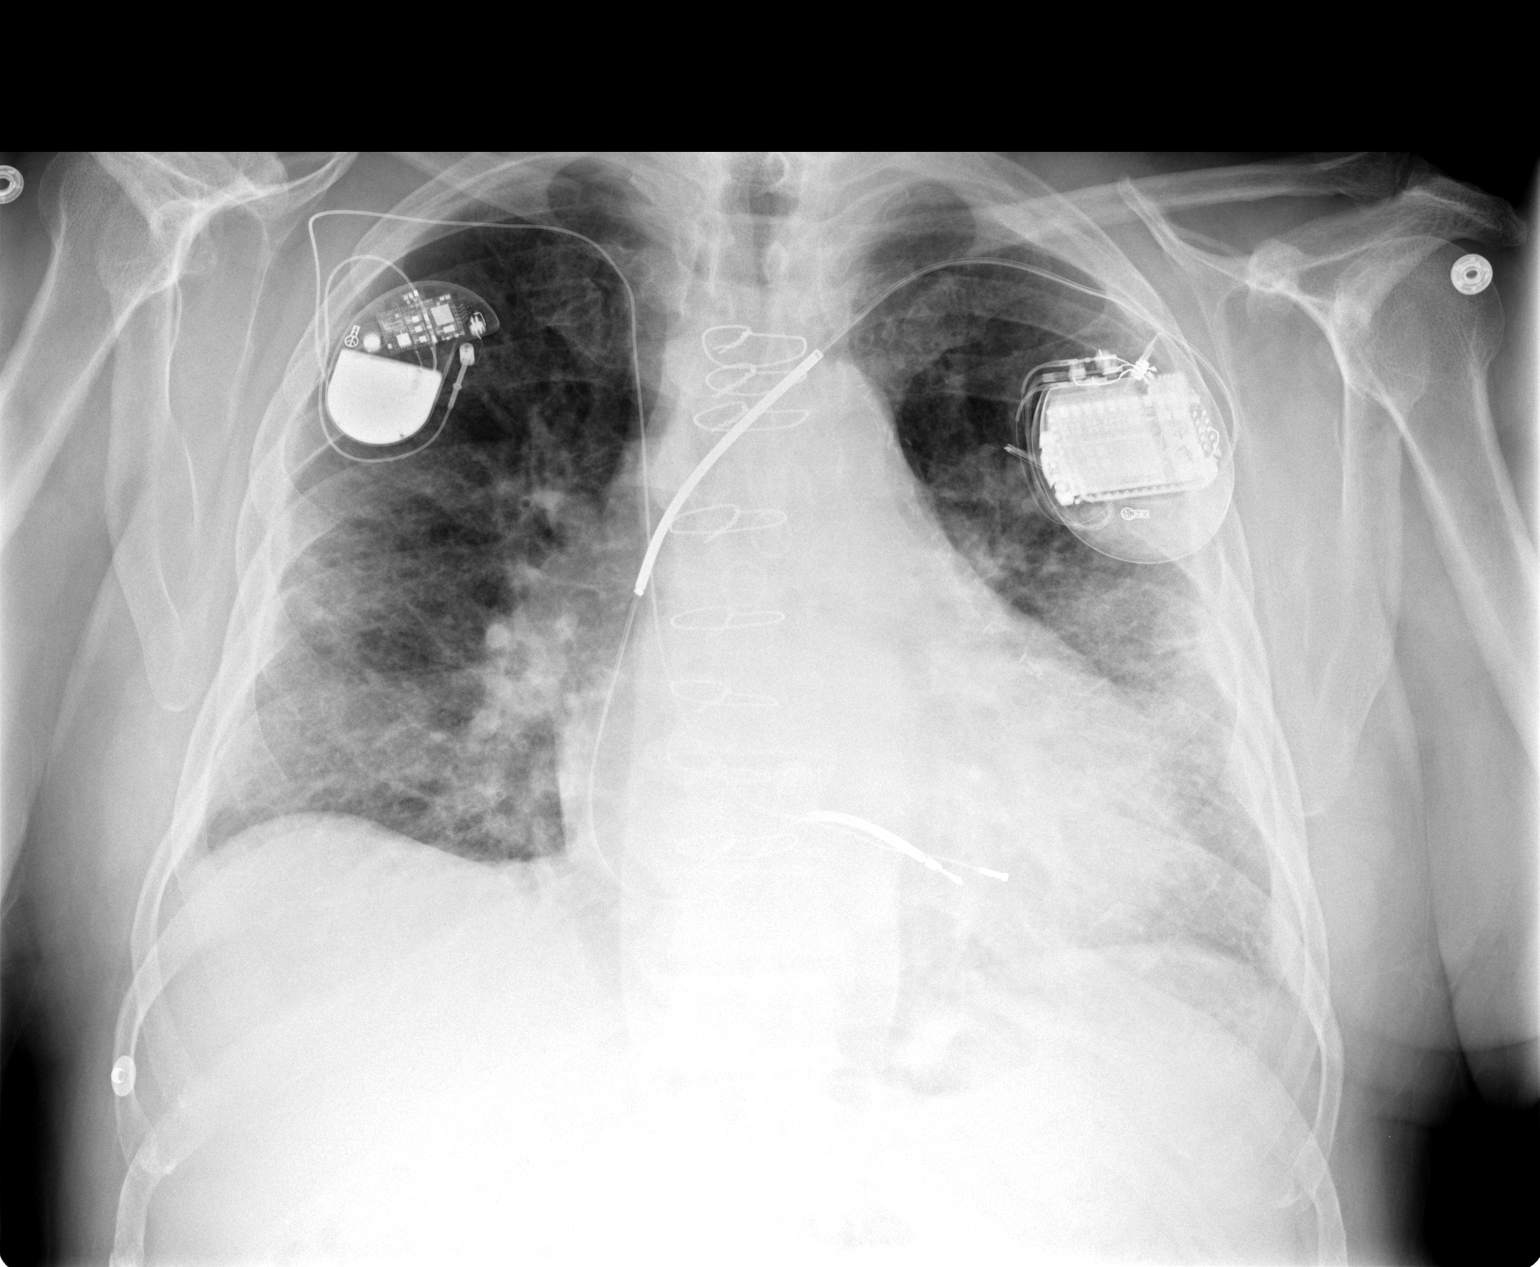

[view not recorded (2 of 2)]
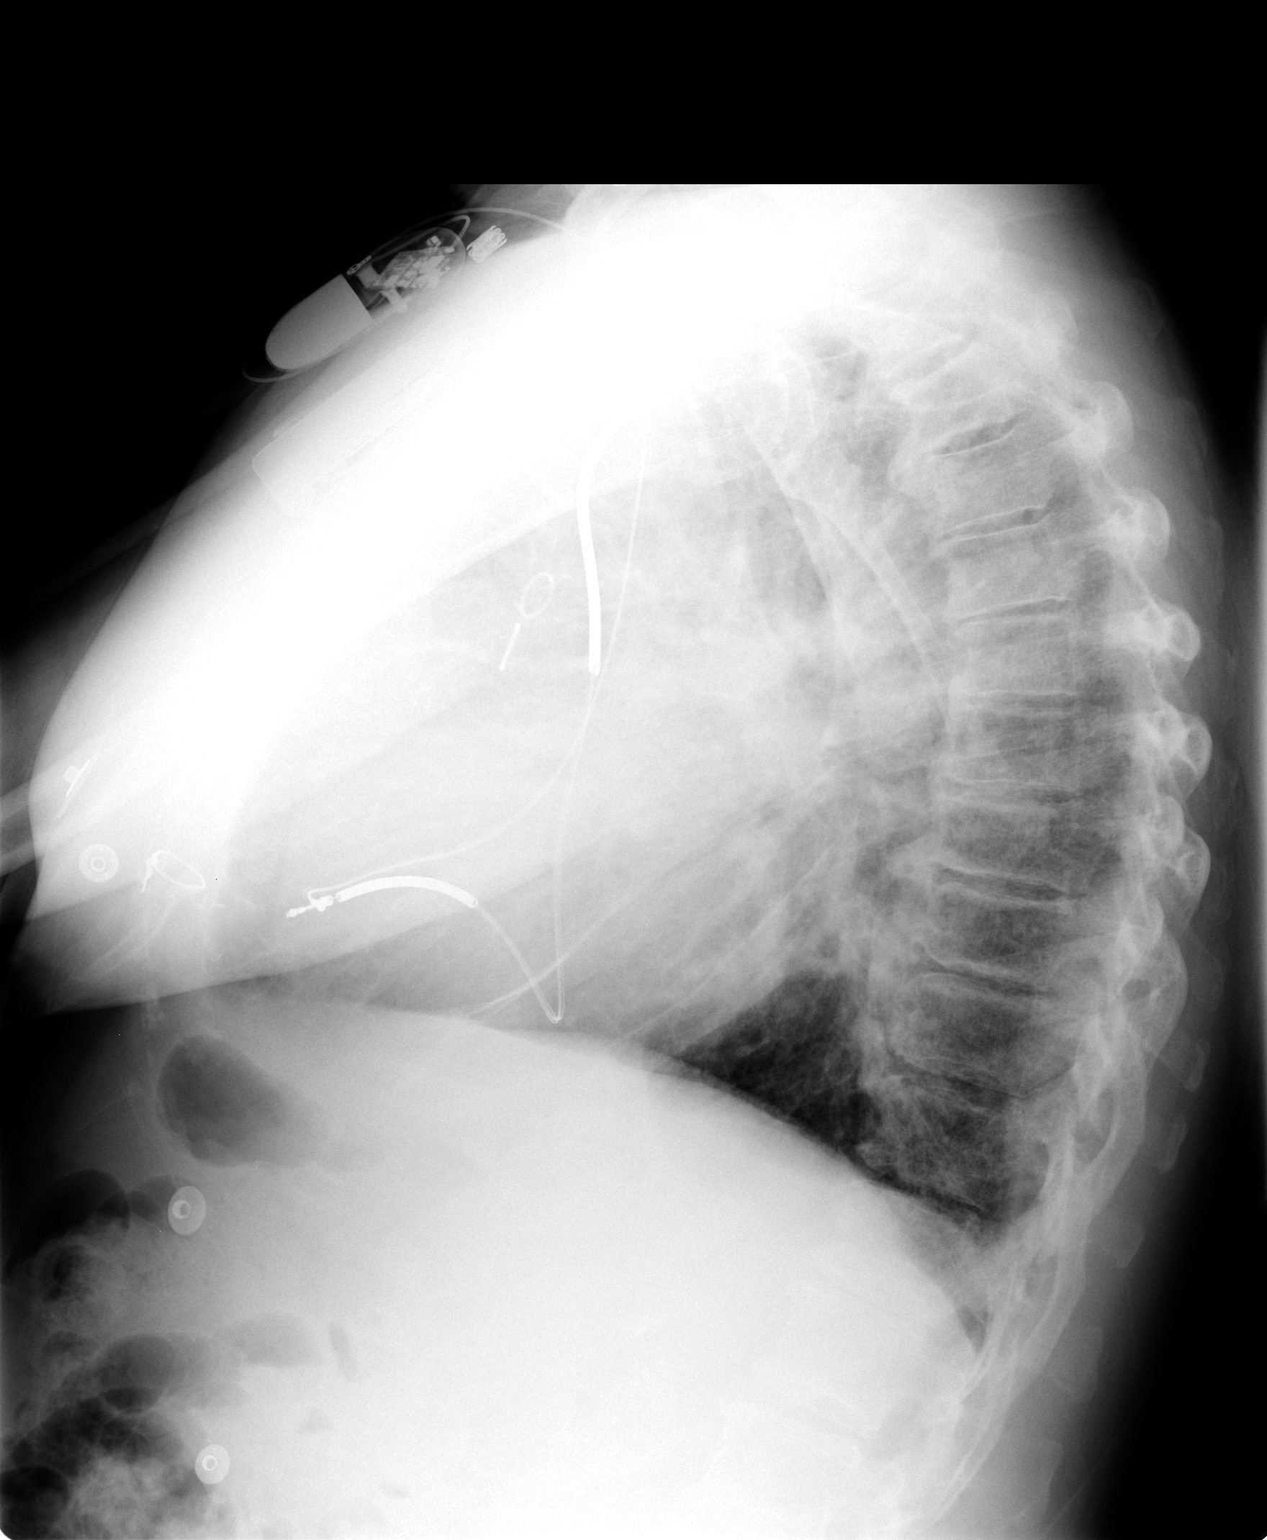

[2 of 2 positions shown; findings below may reference images not displayed]

## 2005-05-17 ENCOUNTER — Ambulatory Visit: Payer: Self-pay

## 2005-05-19 ENCOUNTER — Inpatient Hospital Stay (HOSPITAL_COMMUNITY): Admission: EM | Admit: 2005-05-19 | Discharge: 2005-05-23 | Payer: Self-pay | Admitting: Emergency Medicine

## 2005-05-22 ENCOUNTER — Encounter (INDEPENDENT_AMBULATORY_CARE_PROVIDER_SITE_OTHER): Payer: Self-pay | Admitting: Cardiology

## 2005-05-25 IMAGING — CR DG CHEST 1V PORT
1 series · 1 of 1 positions shown · non-contrast
Comparison: none

CLINICAL DATA: 71 year old with chest pain and shortness of breath. 
 PORTABLE CHEST
 A single portable view of the chest is compared to a previous study from 09/16/03. The pacer wires and AICD are stable.  The heart is enlarged.   There is stable interstitial changes which is just probably mild edema and atelectasis.  
 IMPRESSION 
 No effusions or focal infiltrates.

[view not recorded]
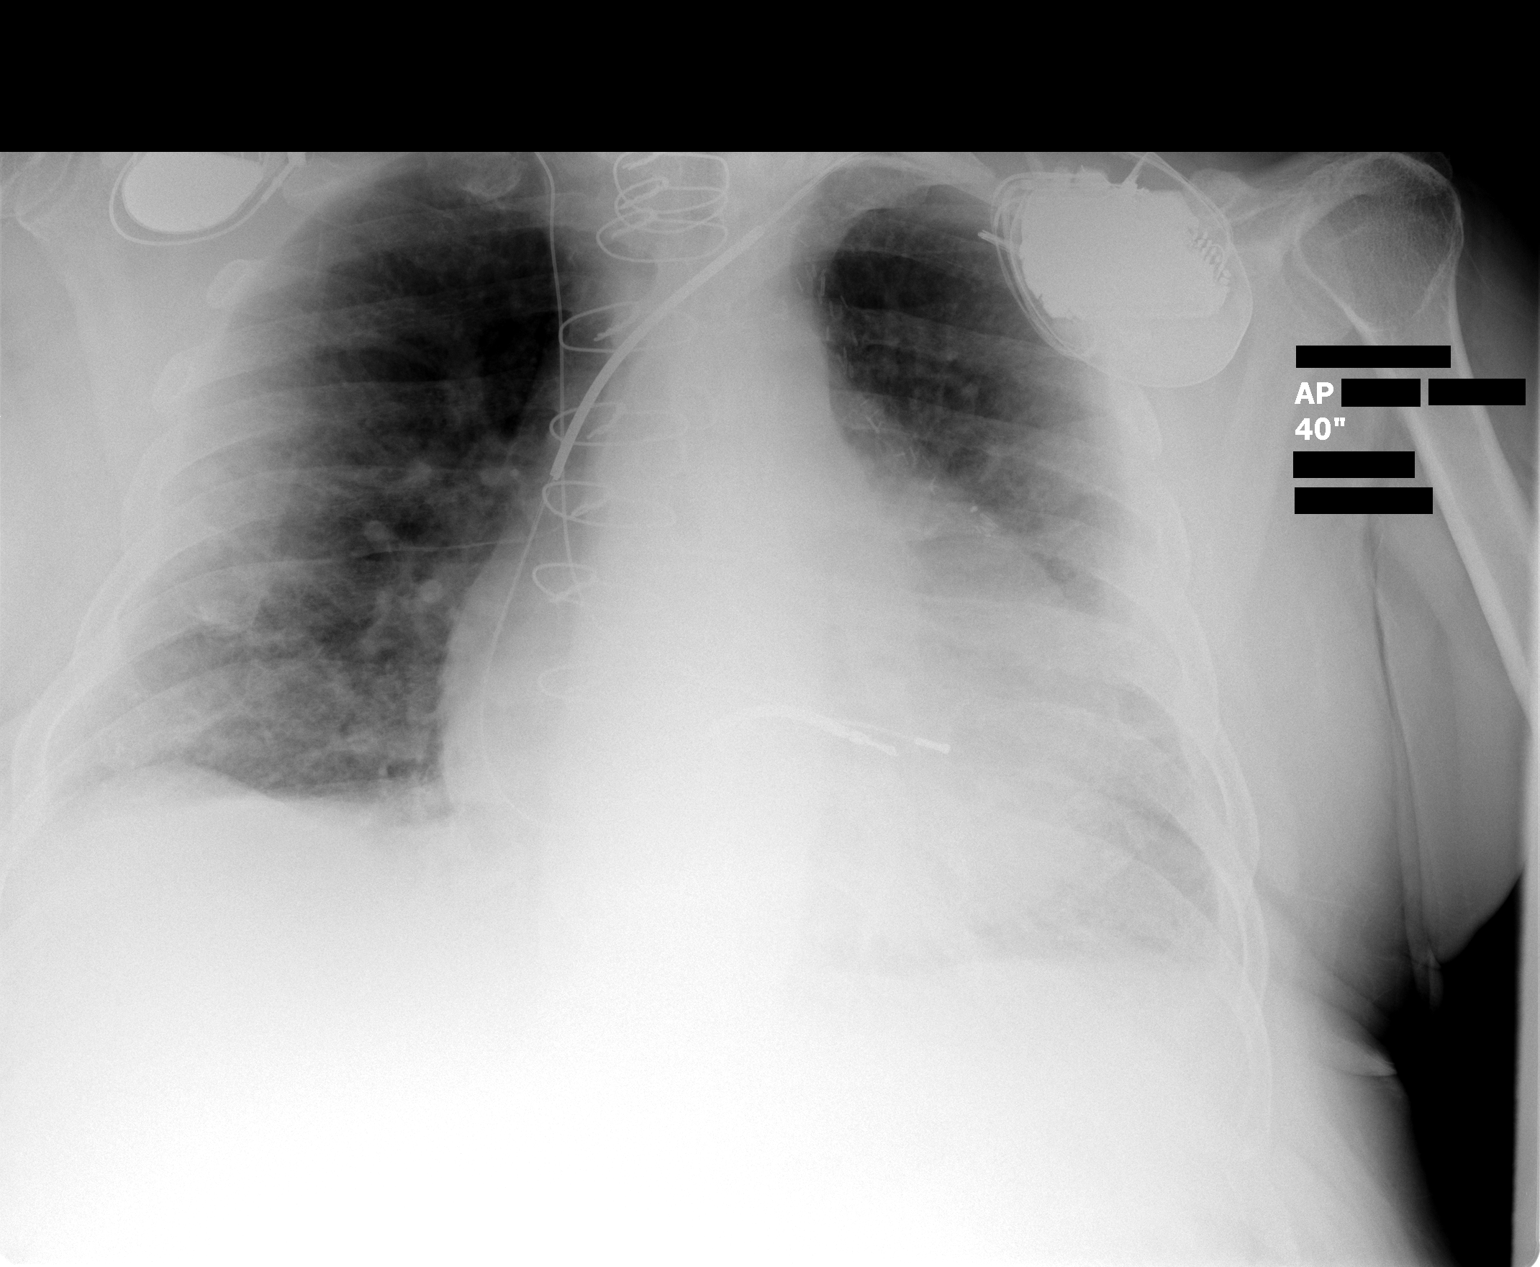

[1 of 1 positions shown; findings below may reference images not displayed]

## 2005-05-26 IMAGING — CT CT ANGIO CHEST
1 of 4 series · 17 of 29 positions shown · IV contrast (120 ML OMNI 300)
Comparison: none

CLINICAL DATA: Chest pain.  Shortness of breath. 
CHEST CT ANGIO WITH CONTRAST 10/26/03 AT 6666 HOURS

Multidetector CT imaging of the chest was performed according to the protocol for detection of pulmonary embolism during IV bolus injection of 120 cc Omnipaque 300.  Coronal and sagittal plane reformatted images were also generated.

[Series 3: pe · axial · 0.70mm/px · z∈[-329,-49]mm · 17 of 254 slices shown]
[im 15/254  lung]
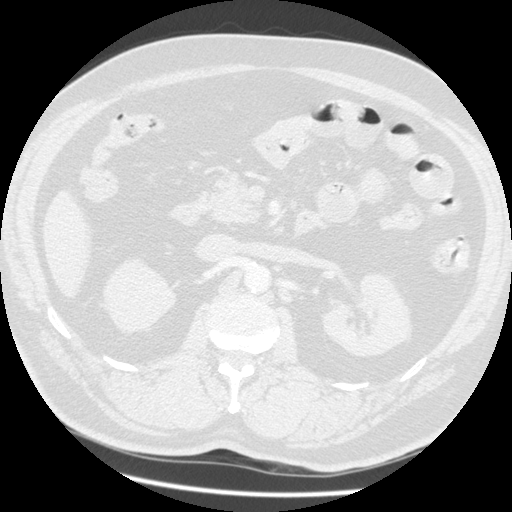
[im 30/254  mediastinal]
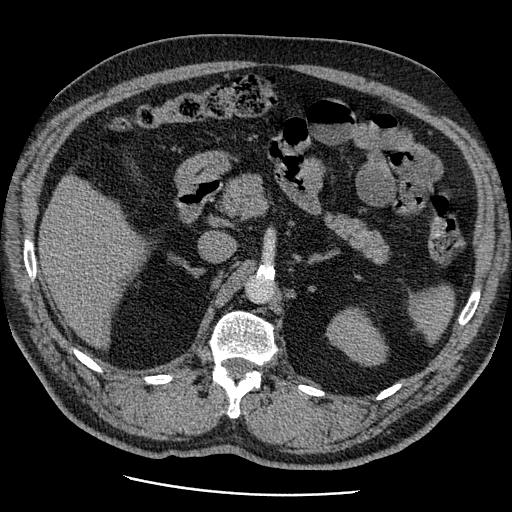
[im 45/254  lung]
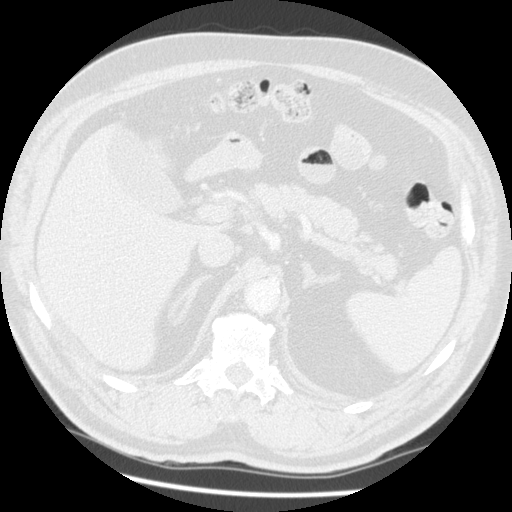
[im 60/254  mediastinal]
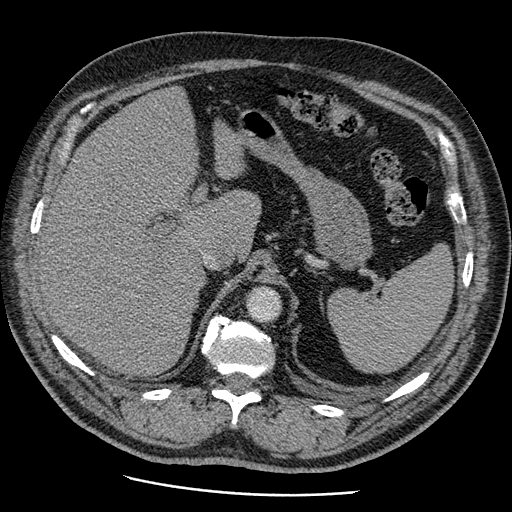
[im 75/254  lung]
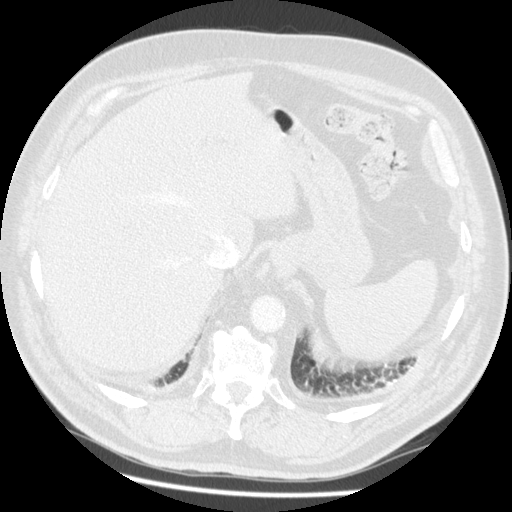
[im 90/254  mediastinal]
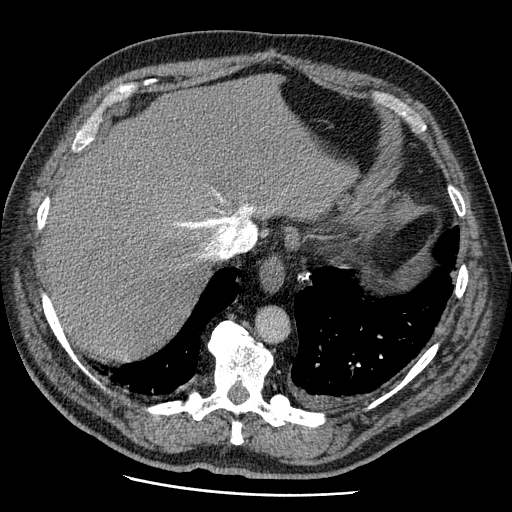
[im 105/254  lung]
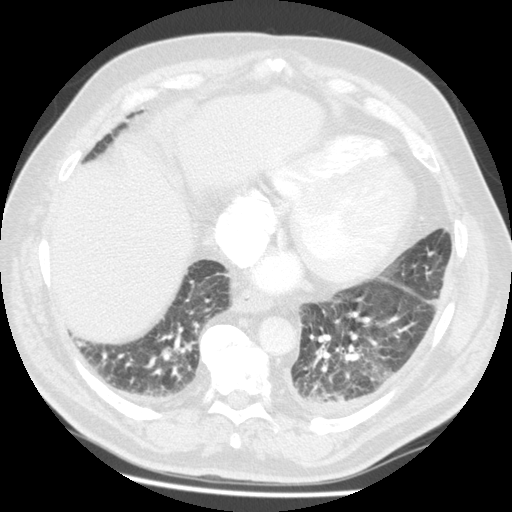
[im 120/254  mediastinal]
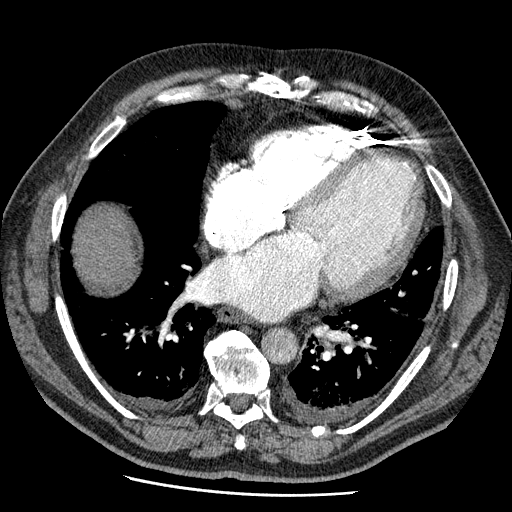
[im 127/254  lung]
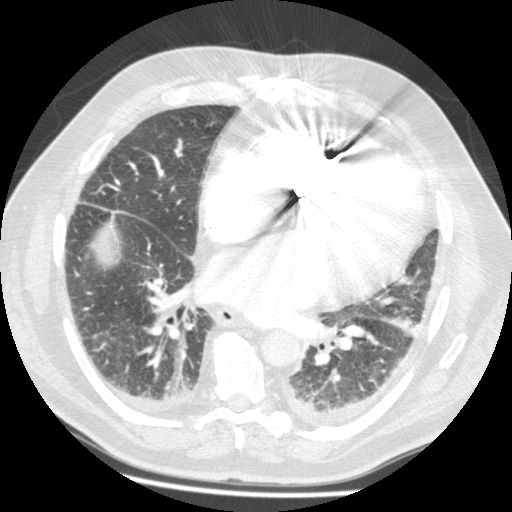
[im 134/254  mediastinal]
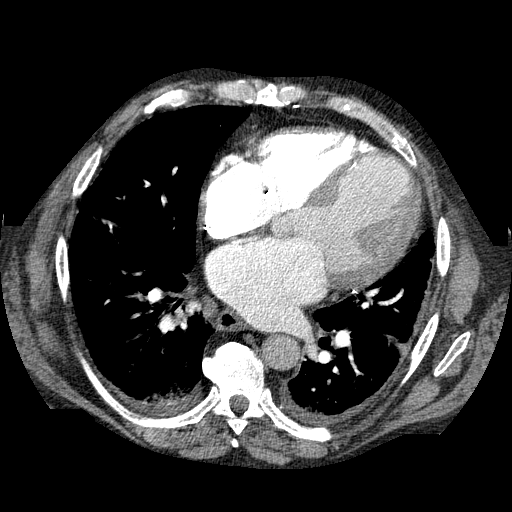
[im 149/254  lung]
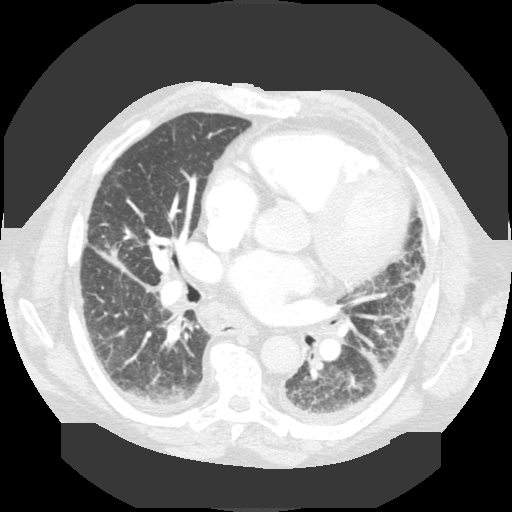
[im 164/254  mediastinal]
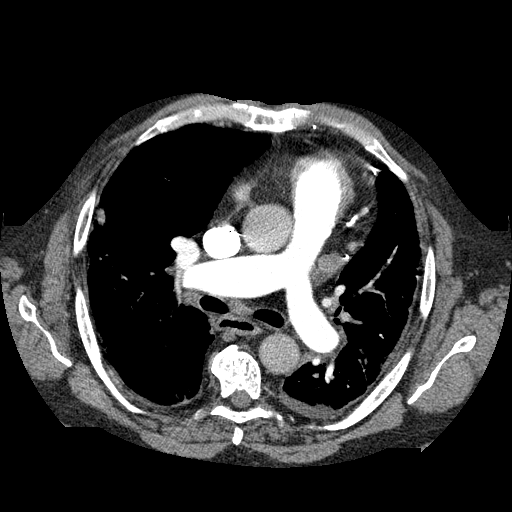
[im 179/254  lung]
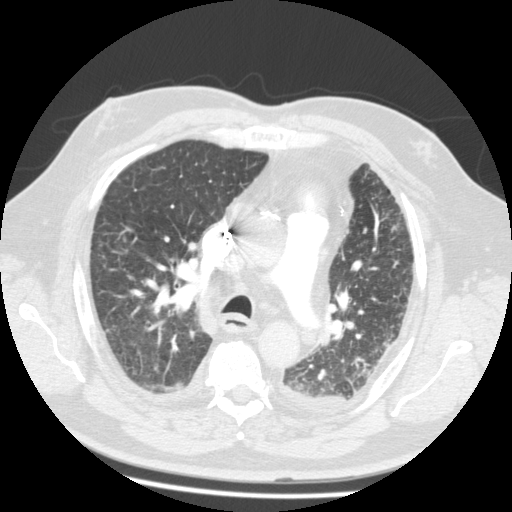
[im 194/254  mediastinal]
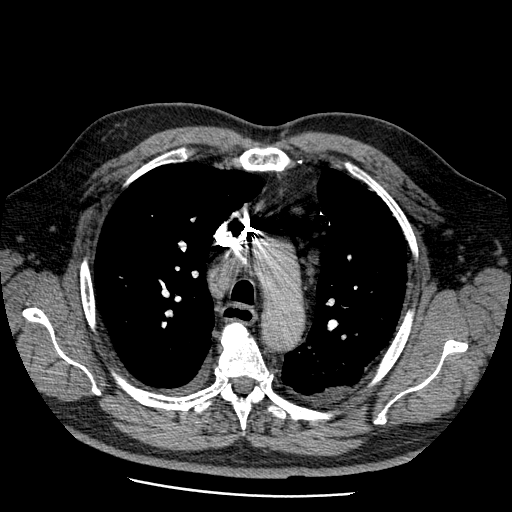
[im 209/254  lung]
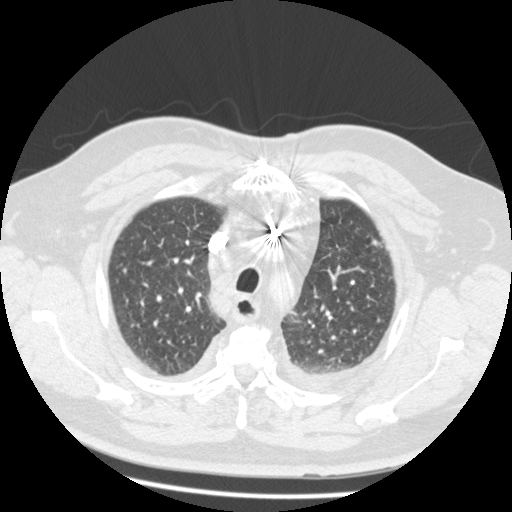
[im 224/254  mediastinal]
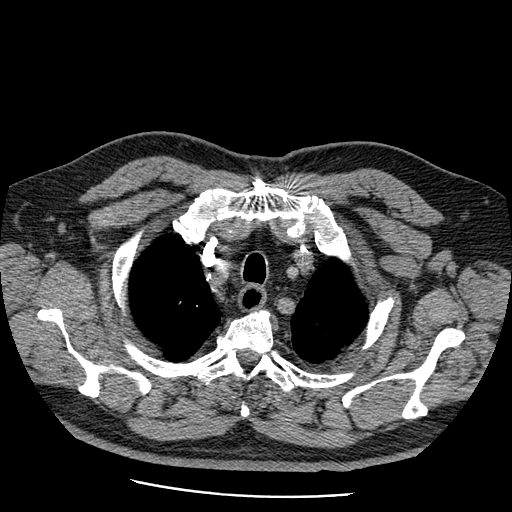
[im 239/254  lung]
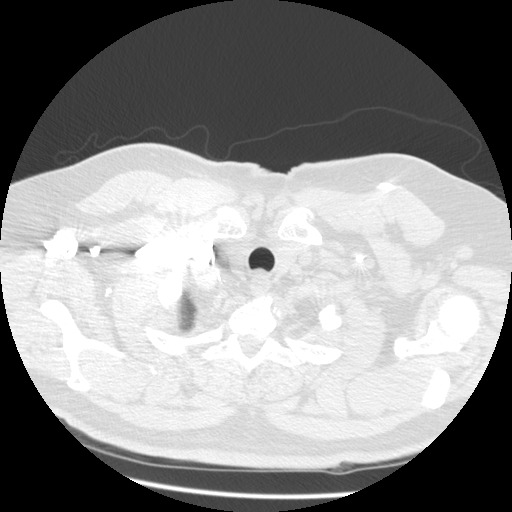

[17 of 29 positions shown; findings below may reference images not displayed]

FINDINGS: No filling defects are seen in the pulmonary arterial tree to suggest pulmonary thromboembolism.  A right subclavian pacemaker and left subclavian AICD device are present.  Paratracheal adenopathy is present.  Borderline enlarged pre-vascular lymph nodes are seen.  There is a 1.1 cm short axis diameter pre-vascular lymph node.  There is a 3.7 X 2.2 cm right paratracheal lymph node.  Other smaller mediastinal nodes are present.  
Small bilateral effusions are seen.  No pneumothoraces are seen. 
Patchy densities are seen in the right middle lobe, the largest is 2.0 X 1.3 cm.  Mild interstitial prominence is present in the lingula and both lung bases likely mild interstitial edema.  Degenerative changes are noted in the spine.  Postsurgical changes are present.  The heart is moderately enlarged.  There is a 3 mm nodule in the right middle lobe.  

IMPRESSION
1.  No evidence of pulmonary thromboembolism. 
2.  Mediastinal adenopathy is present, most prominent in the right paratracheal region.  Follow-up is recommended. 
3.  Patchy densities in the right middle lobe.  There is a 2.0 X 1.3 cm patchy density in the posterior right middle lobe which is favored to represent focal atelectatic change however, spiculated lung mass is not entirely excluded.  Follow-up is warranted in three to six months.  
4.  3 mm nodule in the right middle lobe.  
5.  Cardiomegaly and mild interstitial edema.

## 2005-06-21 ENCOUNTER — Emergency Department (HOSPITAL_COMMUNITY): Admission: EM | Admit: 2005-06-21 | Discharge: 2005-06-21 | Payer: Self-pay | Admitting: Emergency Medicine

## 2005-07-04 IMAGING — CR DG CHEST 2V
2 series · 2 of 2 positions shown · non-contrast
Comparison: none

CLINICAL DATA: Difficulty breathing; shortness of breath 
 CHEST TWO VIEWS 
 Comparison 10/25/03.
 Left sided defibrillator and right sided single lead pacer remain in place, unchanged.  There is cardiomegaly.  Stable vascular congestion is noted.  I doubt there is any pulmonary edema.  There is mild pleural thickening noted on the left.  Areas of atelectasis in both bases. 
 IMPRESSION
 1.  Cardiomegaly with vascular congestion.  
 2.  Mild left pleural thickening. 
 3.  Bibasilar atelectasis.

[view not recorded (1 of 2)]
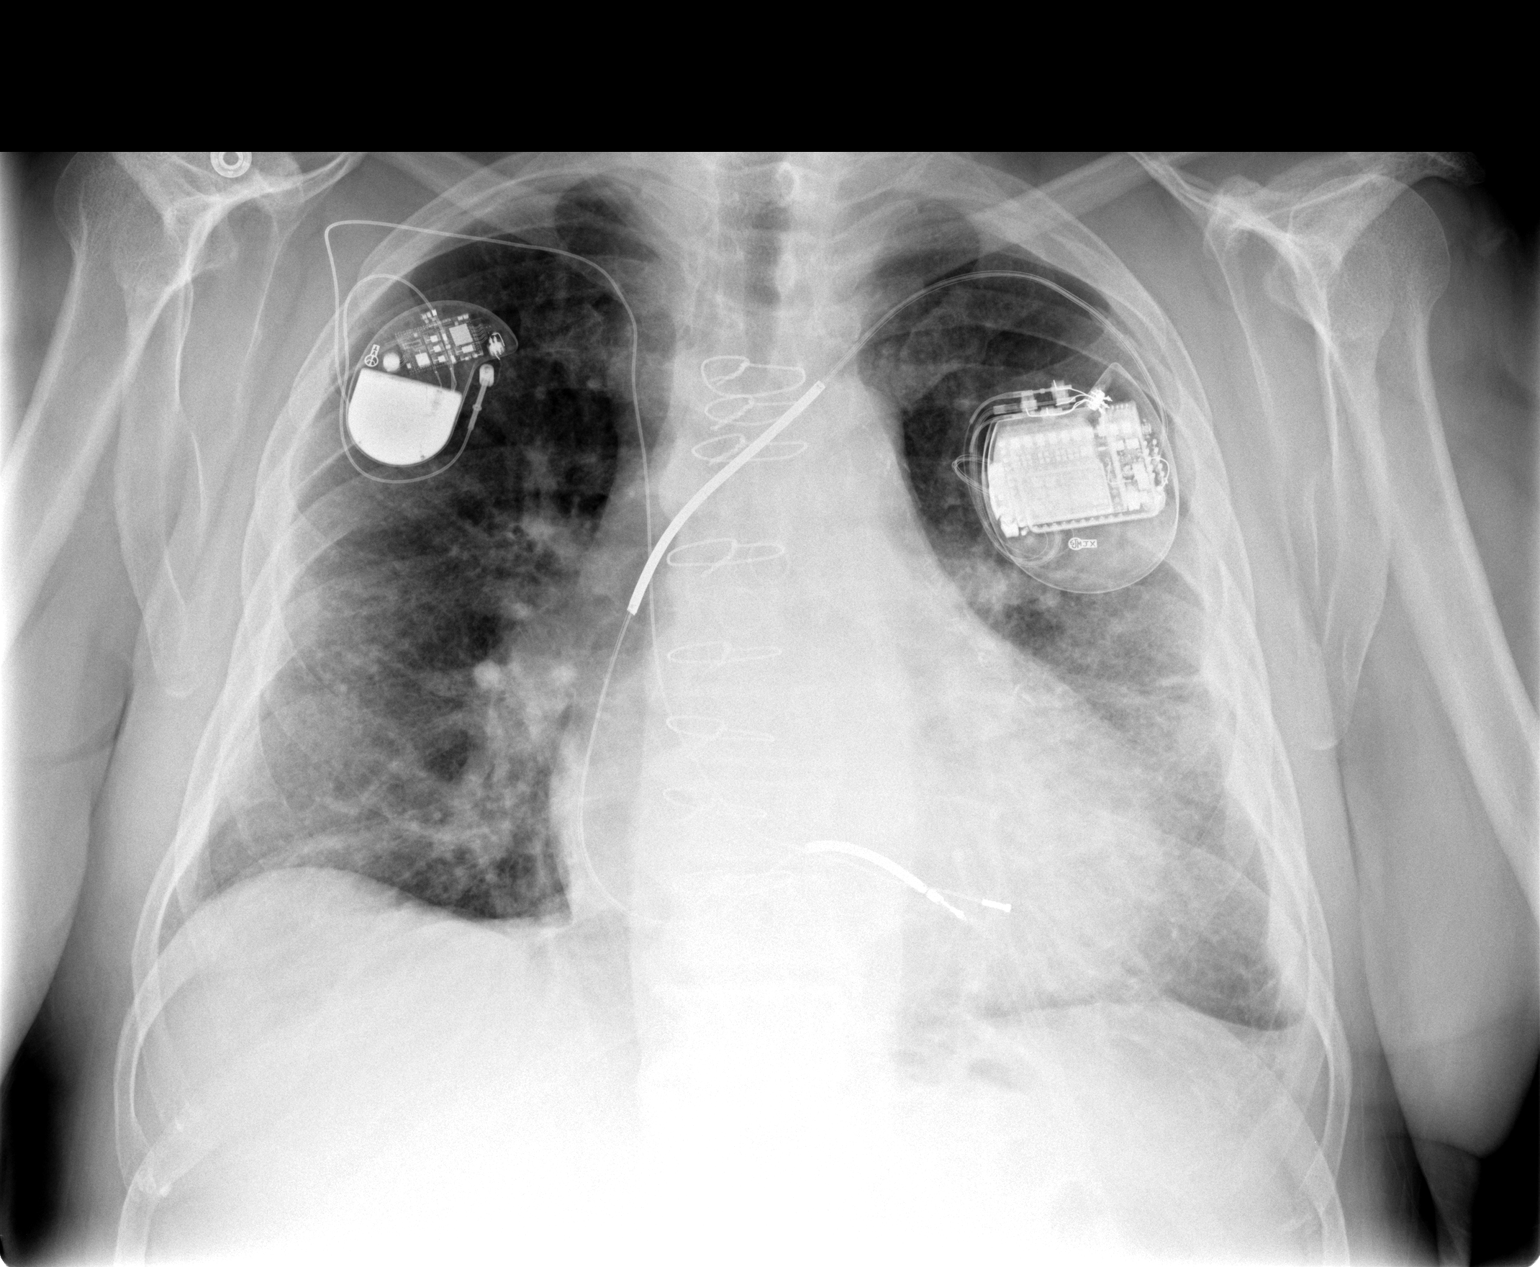

[view not recorded (2 of 2)]
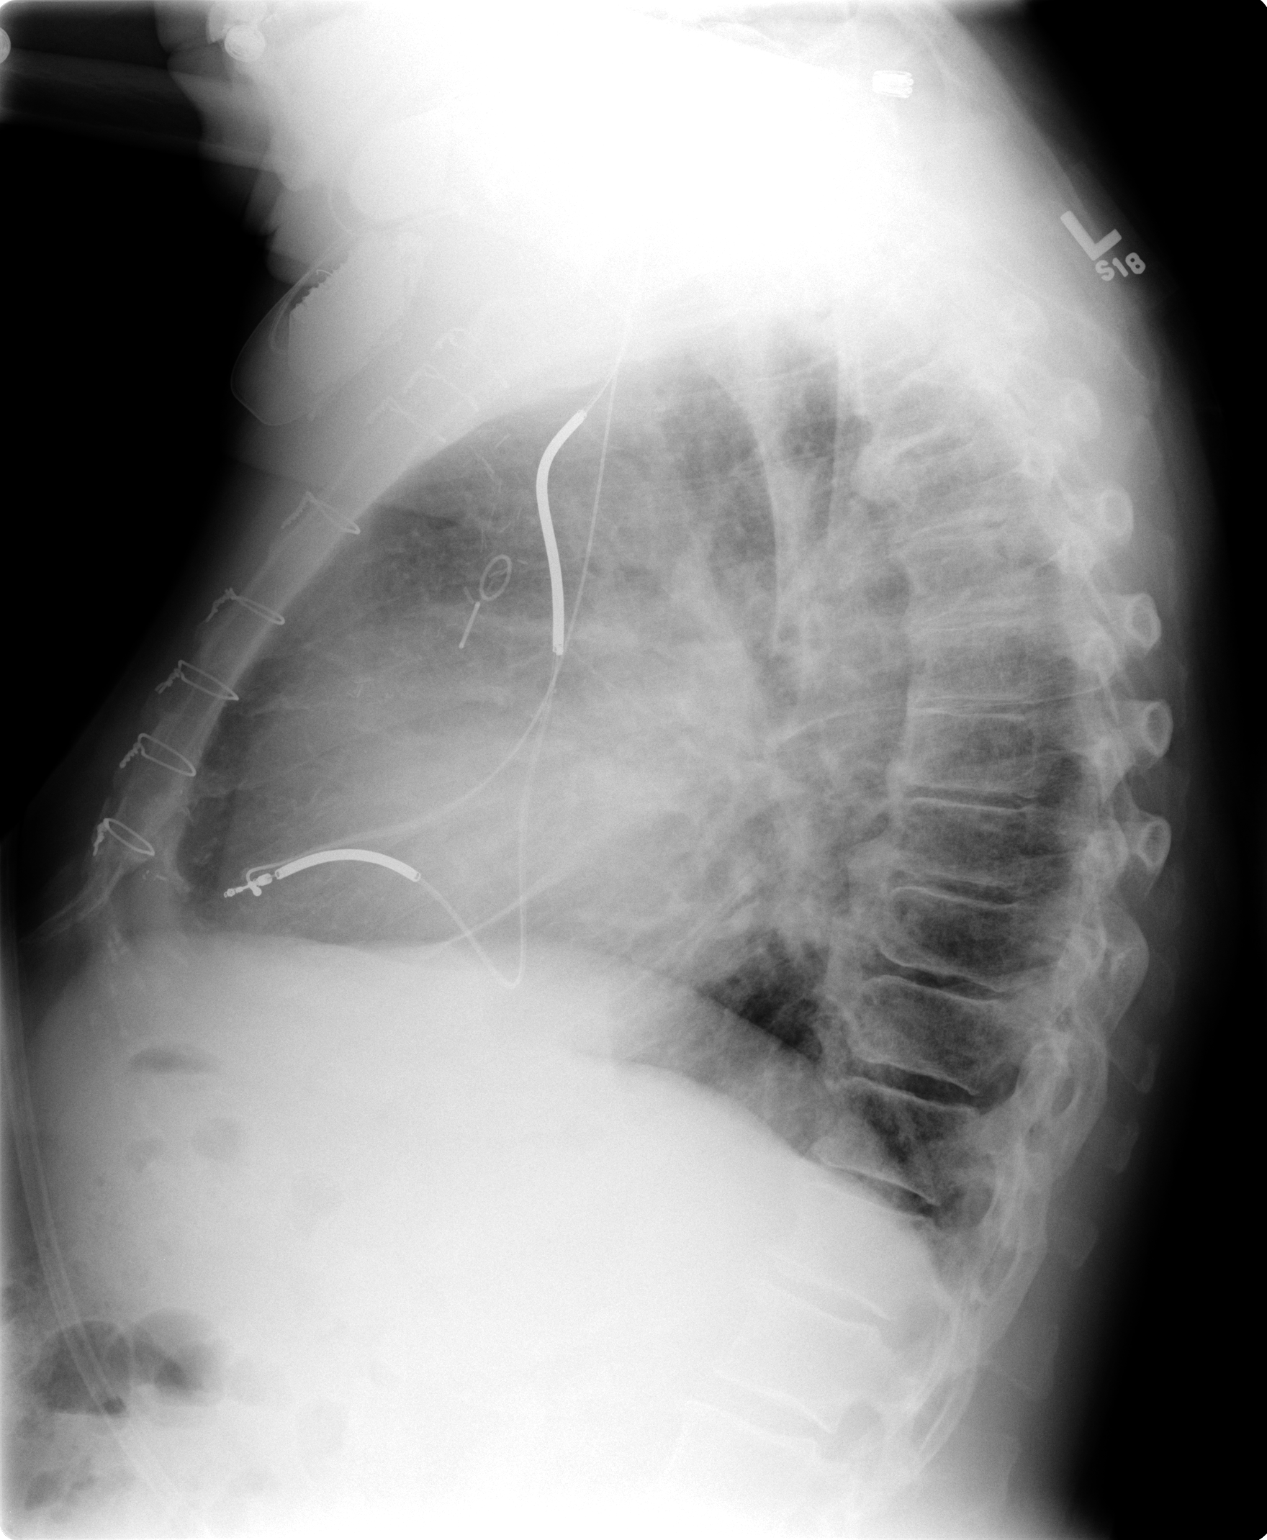

[2 of 2 positions shown; findings below may reference images not displayed]

## 2005-07-24 ENCOUNTER — Emergency Department (HOSPITAL_COMMUNITY): Admission: EM | Admit: 2005-07-24 | Discharge: 2005-07-25 | Payer: Self-pay | Admitting: Emergency Medicine

## 2005-07-27 ENCOUNTER — Emergency Department (HOSPITAL_COMMUNITY): Admission: EM | Admit: 2005-07-27 | Discharge: 2005-07-27 | Payer: Self-pay | Admitting: Emergency Medicine

## 2005-07-30 ENCOUNTER — Ambulatory Visit (HOSPITAL_COMMUNITY): Admission: RE | Admit: 2005-07-30 | Discharge: 2005-07-30 | Payer: Self-pay | Admitting: Emergency Medicine

## 2005-08-28 ENCOUNTER — Ambulatory Visit: Payer: Self-pay | Admitting: Internal Medicine

## 2005-09-05 IMAGING — CR DG CHEST 1V PORT
1 series · 1 of 1 positions shown · non-contrast
Comparison: 12/04/03.

CLINICAL DATA: Chest pain, cough.
 PORTABLE CHEST:

[view not recorded]
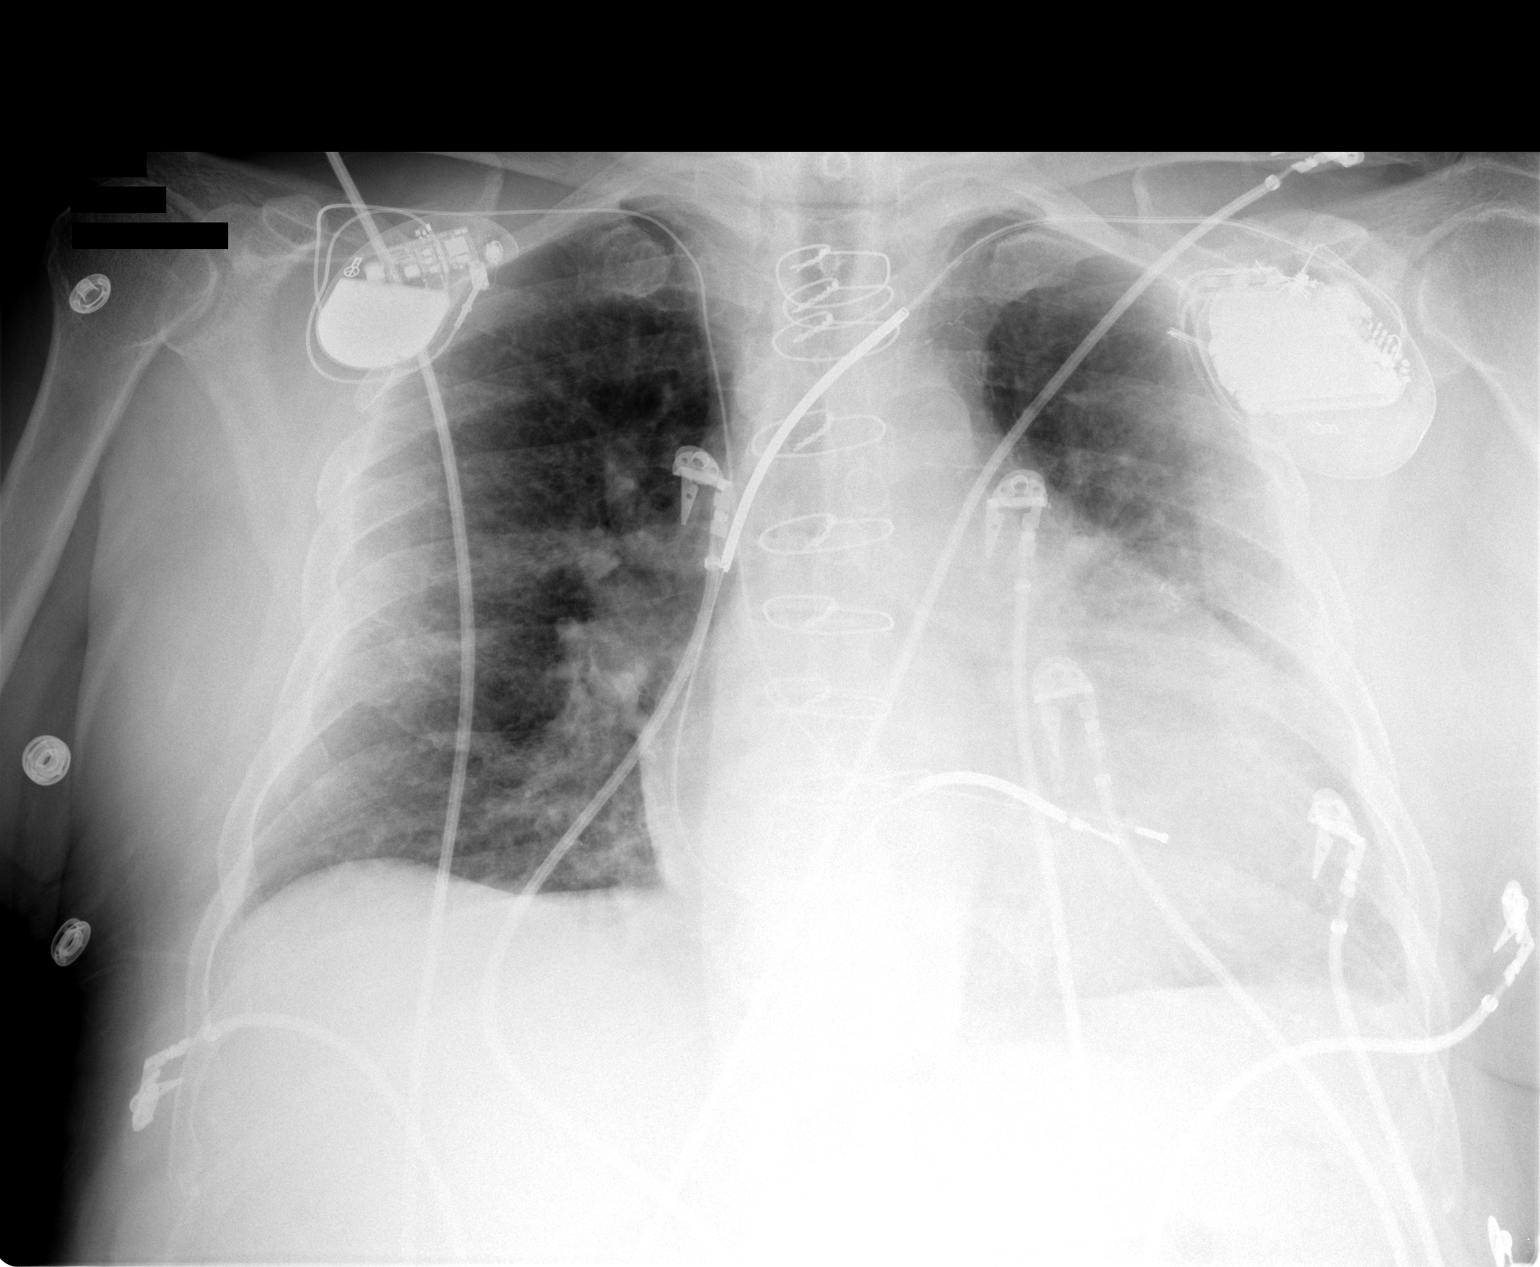

[1 of 1 positions shown; findings below may reference images not displayed]

Cardiomegaly, AICD pacemakers, cardiac surgery, and pulmonary vascular congestion noted.  No definite pulmonary edema is identified.  Left pleural thickening is unchanged.
IMPRESSION: Cardiomegaly and pulmonary vascular congestion.

## 2005-09-29 ENCOUNTER — Emergency Department (HOSPITAL_COMMUNITY): Admission: EM | Admit: 2005-09-29 | Discharge: 2005-09-29 | Payer: Self-pay | Admitting: Emergency Medicine

## 2005-11-20 ENCOUNTER — Ambulatory Visit: Payer: Self-pay | Admitting: Internal Medicine

## 2005-12-13 IMAGING — CR DG CHEST 2V
2 series · 2 of 2 positions shown · non-contrast
Comparison: 02/05/04 and 12/04/03.

CLINICAL DATA: 71-year-old male with shortness of breath, difficulty breathing.  
 CHEST - 2 VIEWS:

[view not recorded (1 of 2)]
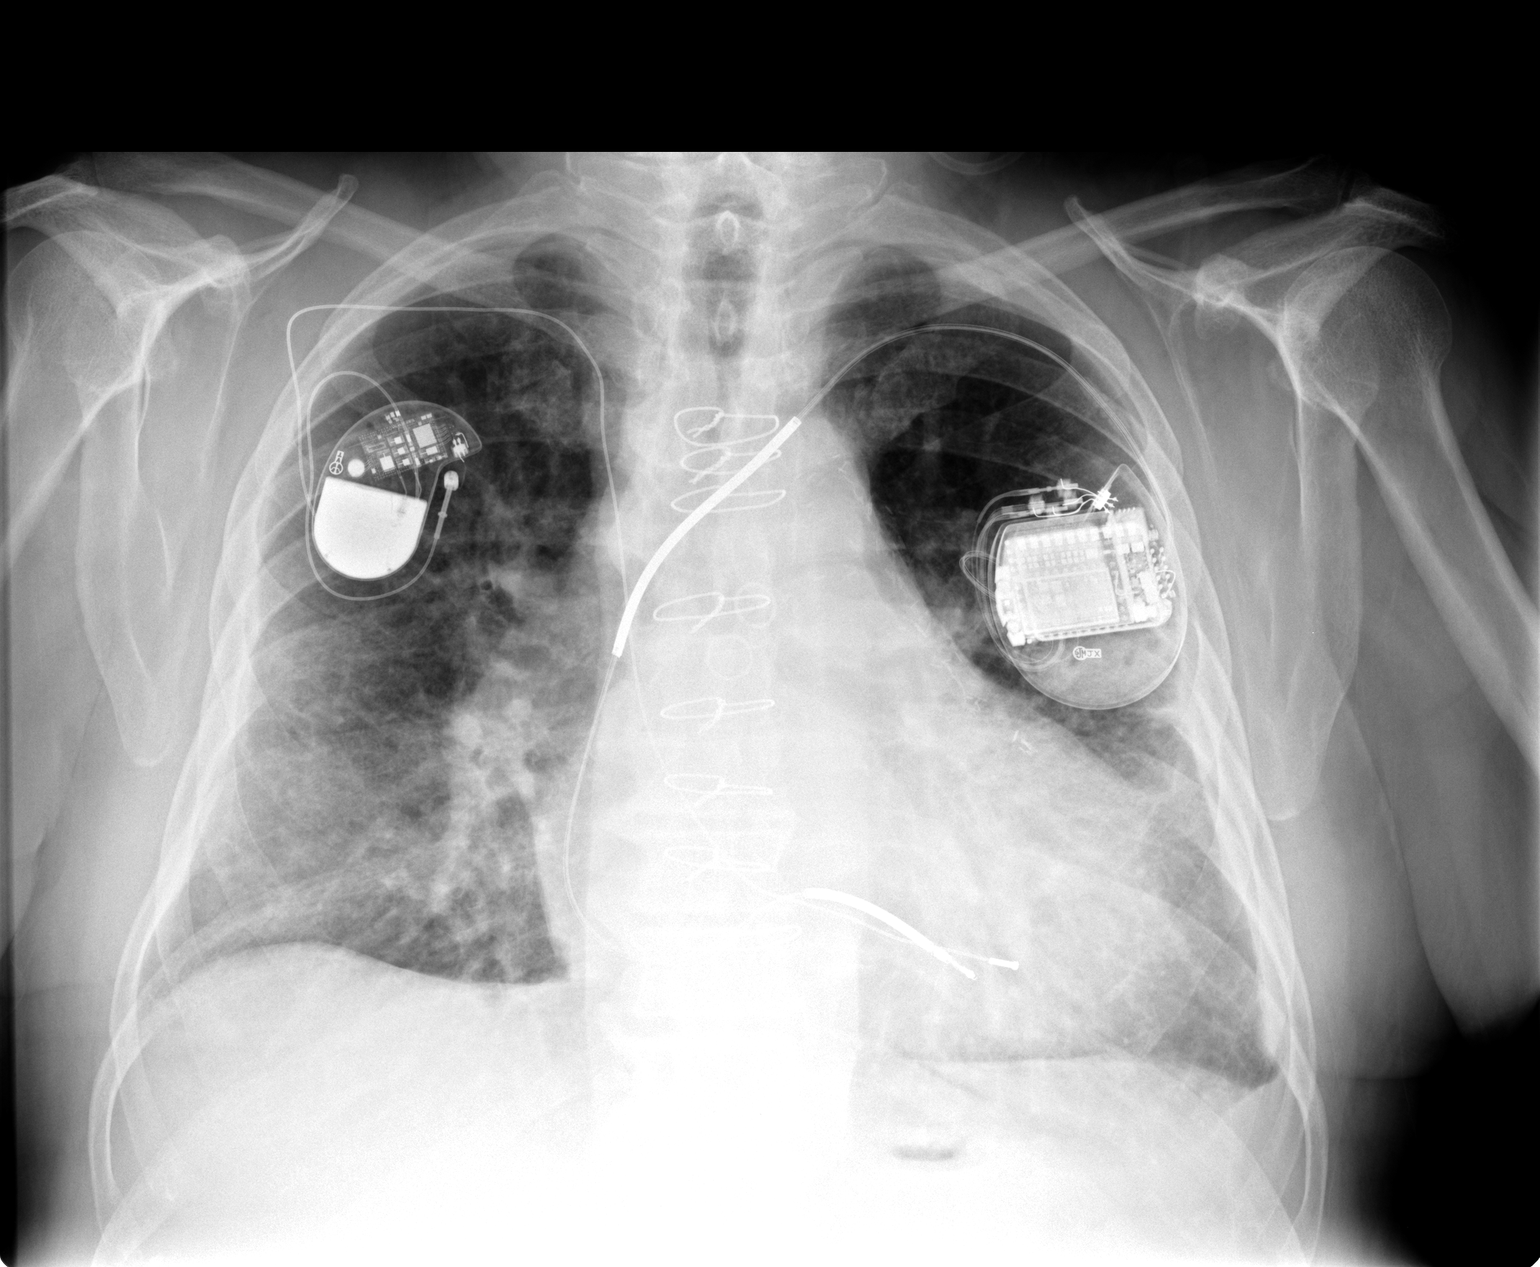

[view not recorded (2 of 2)]
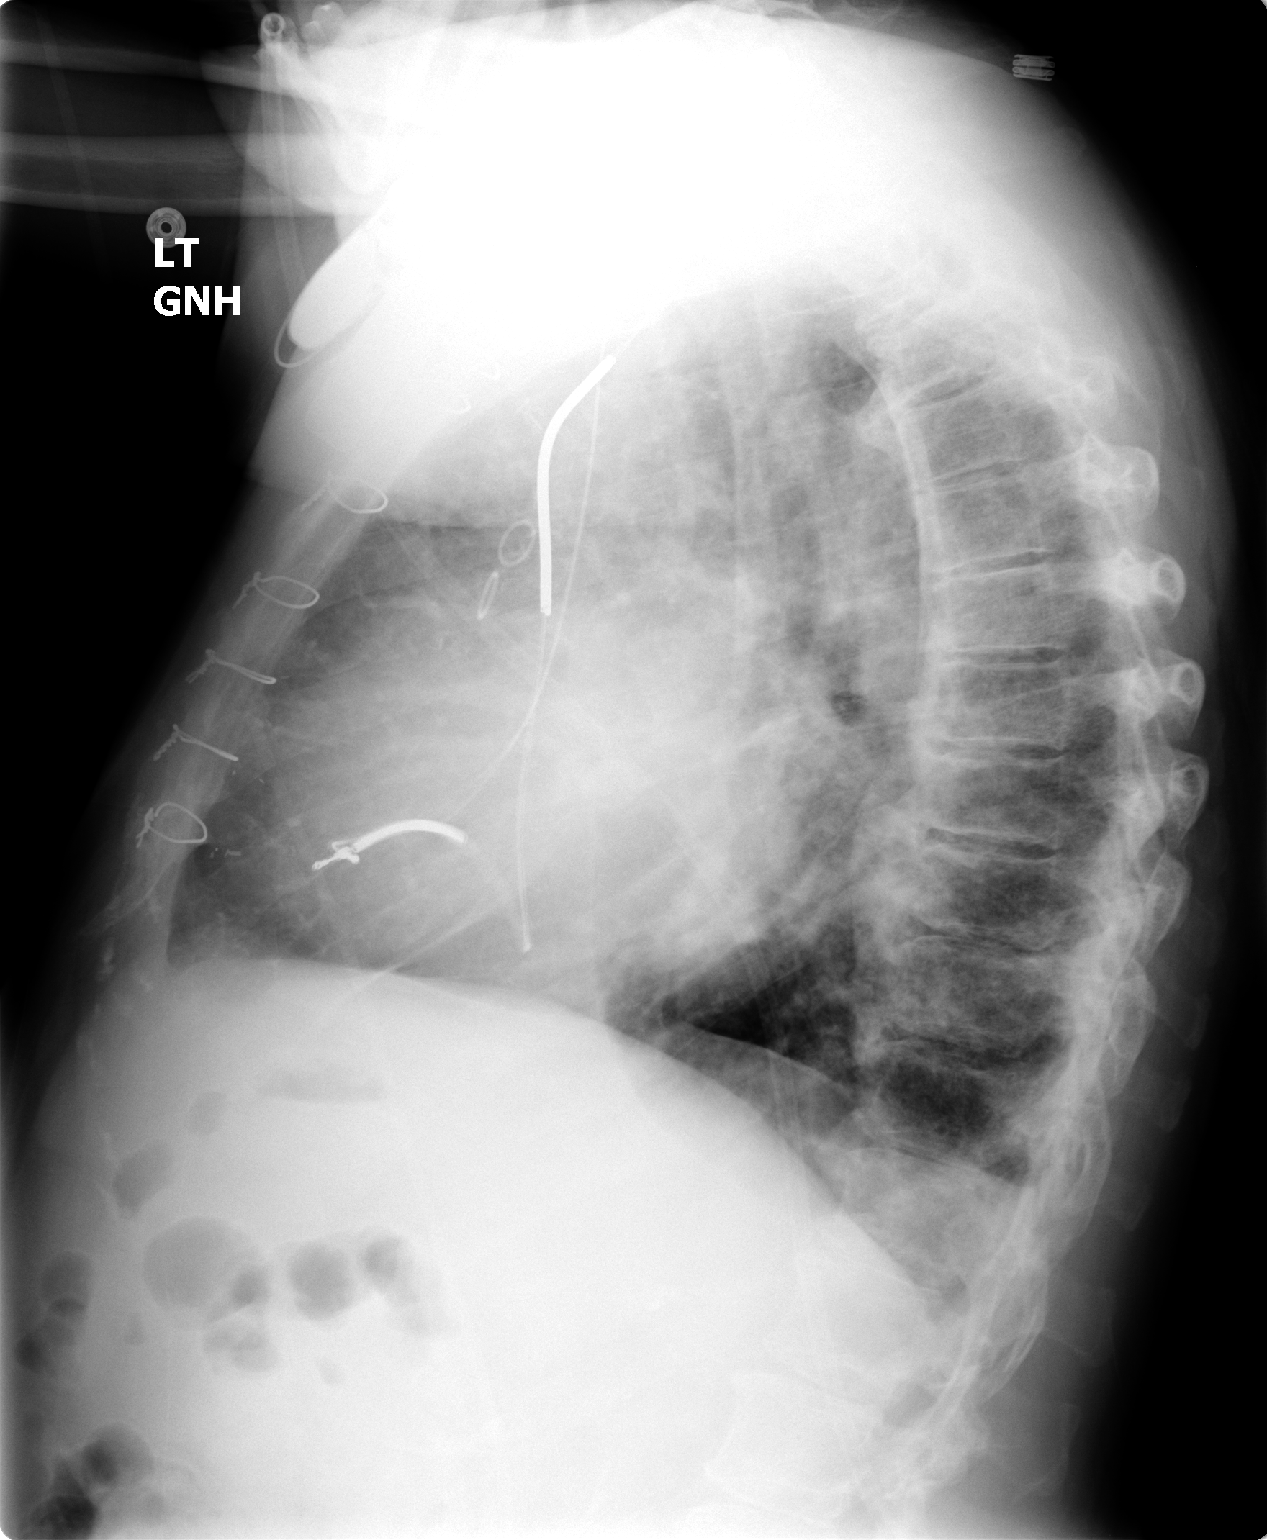

[2 of 2 positions shown; findings below may reference images not displayed]

FINDINGS: There is a right subclavian single lead pacemaker and a left subclavian single lead defibrillator.  Mild cardiomegaly with vascular congestion and diffuse interstitial prominence concerning for early interstitial edema.  Small left effusion.  Patient has a median sternotomy for CABG.
IMPRESSION: 1.  Mild interstitial edema.
 2.  Small left effusion.

## 2005-12-14 IMAGING — CR DG CHEST 2V
2 series · 2 of 2 positions shown · non-contrast
Comparison: none

CLINICAL DATA: Chest pain, shortness of breath.
 2-VIEW CHEST:
 PA and lateral views of the chest dated 05/15/04 are compared to a study of the previous day.  Postoperative changes are again noted with a pacemaker and AICD device in place.  No significant interval change is noted since the prior study.

[view not recorded (1 of 2)]
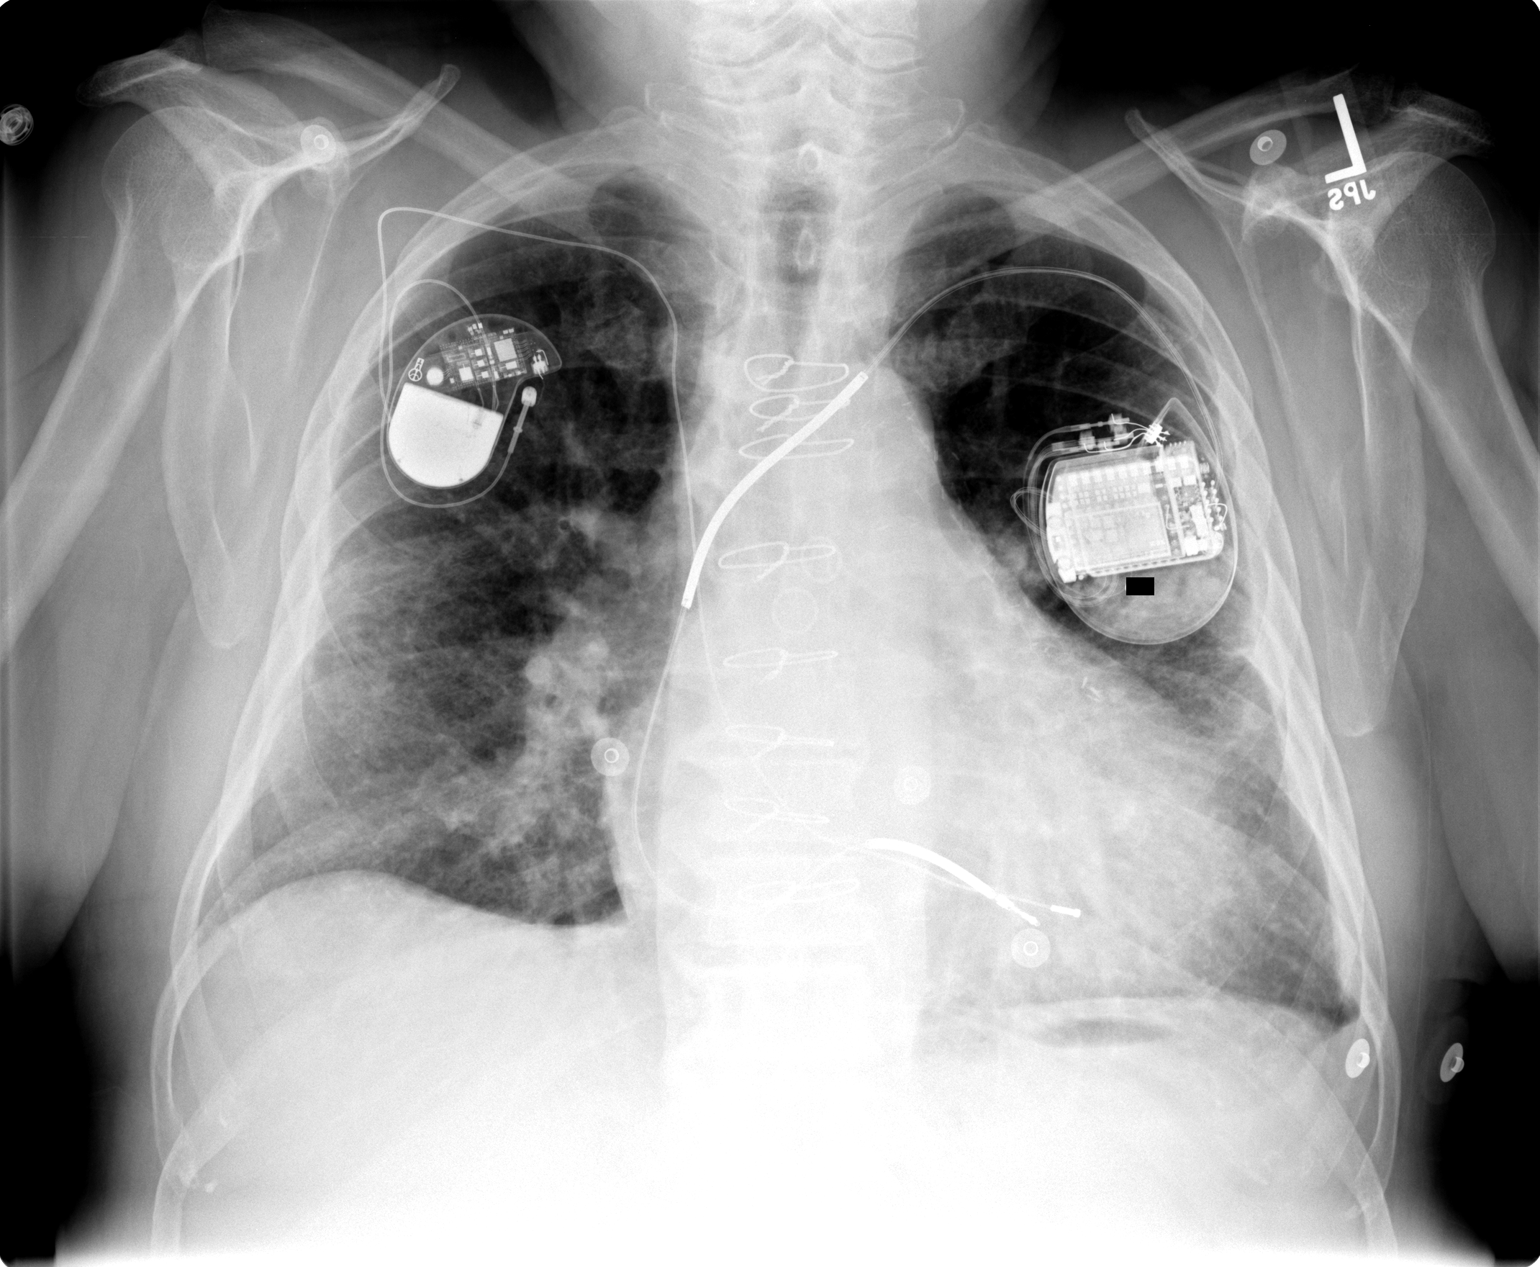

[view not recorded (2 of 2)]
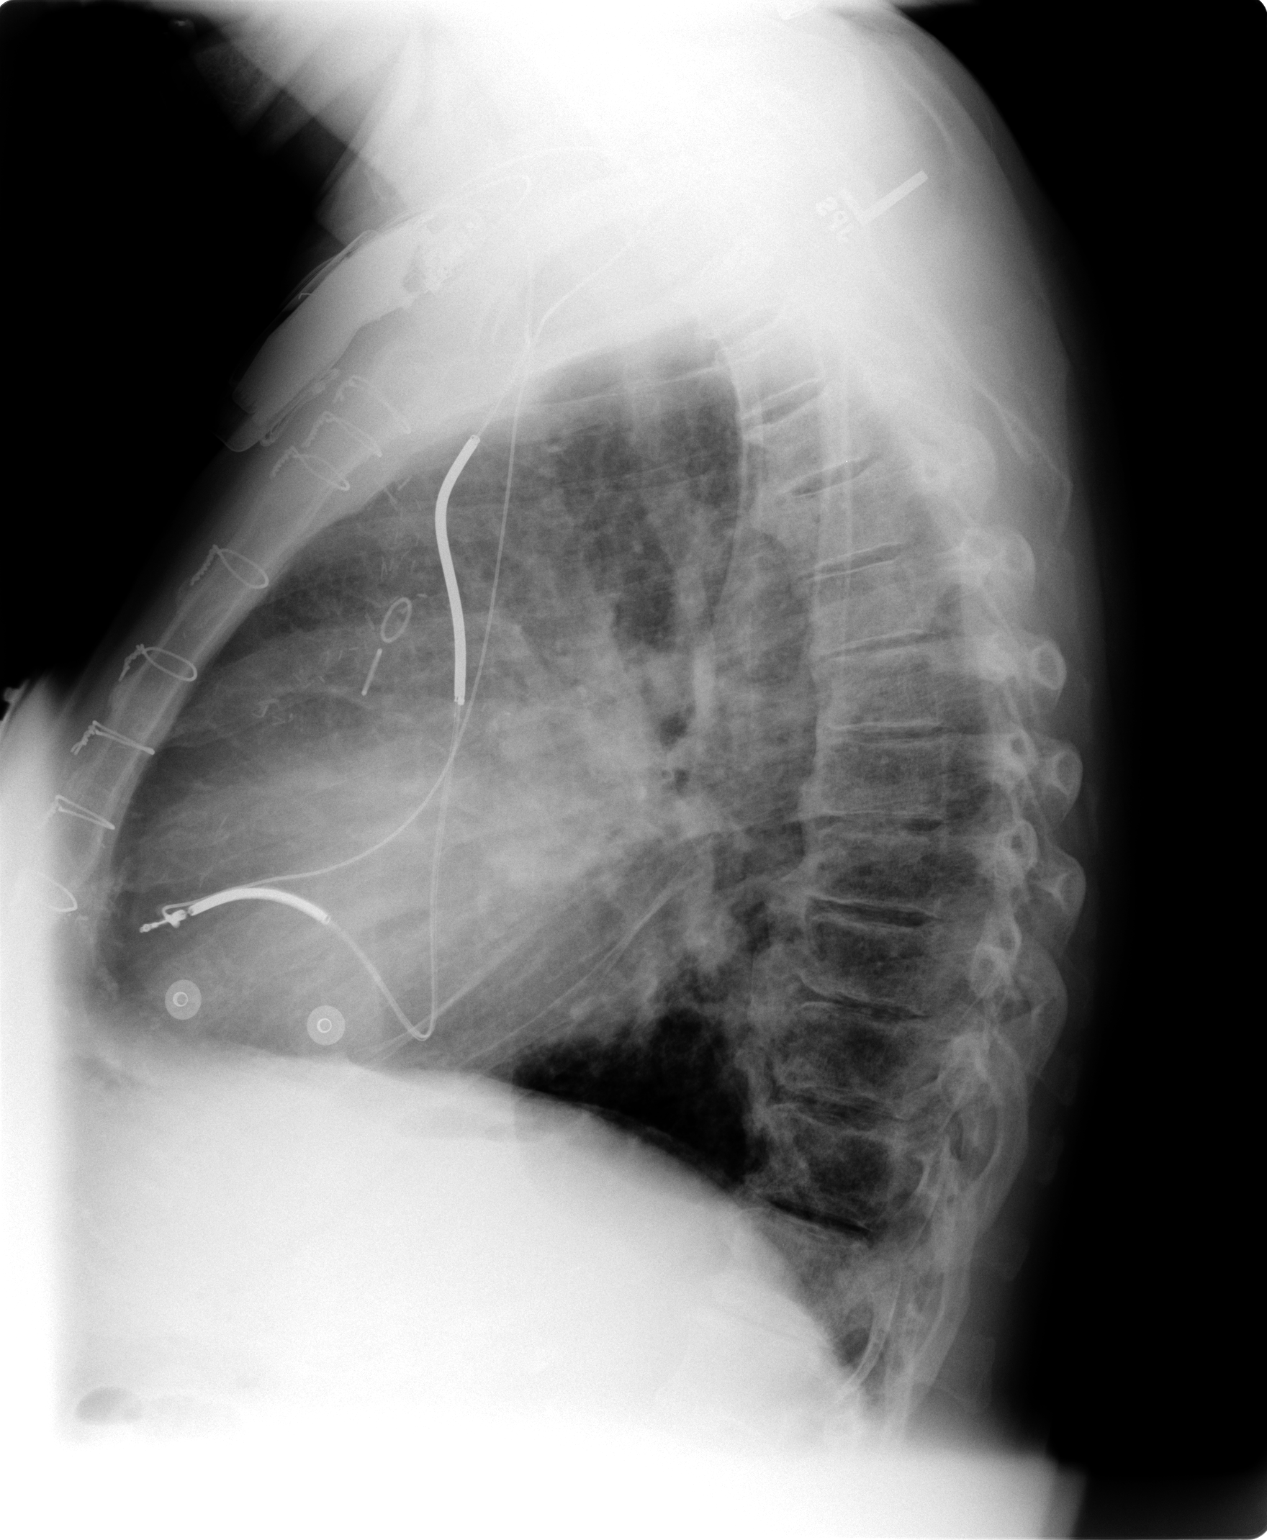

[2 of 2 positions shown; findings below may reference images not displayed]

IMPRESSION: Negative chest for interval change with chronic changes noted as before.

## 2006-02-21 ENCOUNTER — Ambulatory Visit: Payer: Self-pay | Admitting: Internal Medicine

## 2006-02-24 IMAGING — CT CT ANGIO CHEST
1 of 6 series · 12 of 31 positions shown · IV contrast (120 ML OMNI 300)
Comparison: 10/26/2003.

CLINICAL DATA: Chest pain and dyspnea.    
 CT ANGIOGRAPHY OF CHEST:
TECHNIQUE: Multidetector CT imaging of the chest was performed during bolus injection of intravenous contrast.  Multiplanar CT angiographic image reconstructions were generated to evaluate the vascular anatomy.
 Contrast:  100 cc Omnipaque 300

[Series 3: pe · axial · 0.71mm/px · z∈[-297,-27]mm · 12 of 521 slices shown]
[im 44/521  lung]
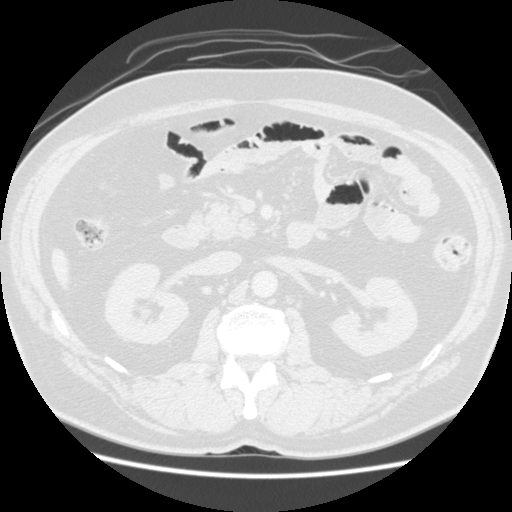
[im 87/521  mediastinal]
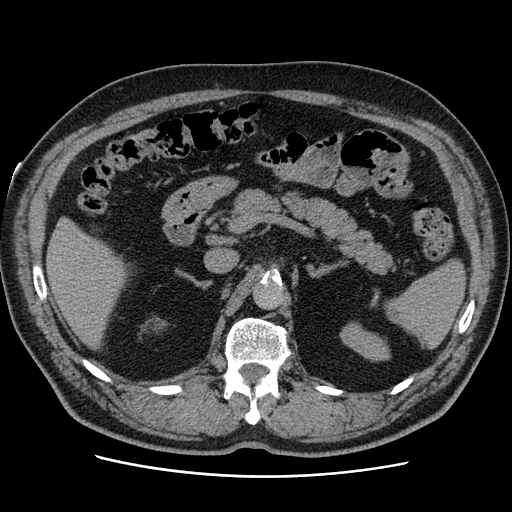
[im 131/521  lung]
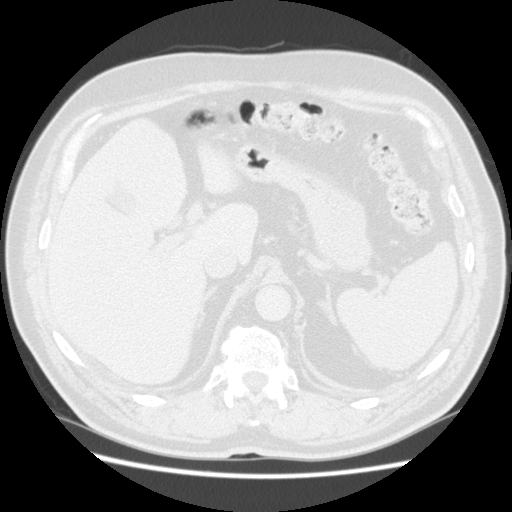
[im 174/521  mediastinal]
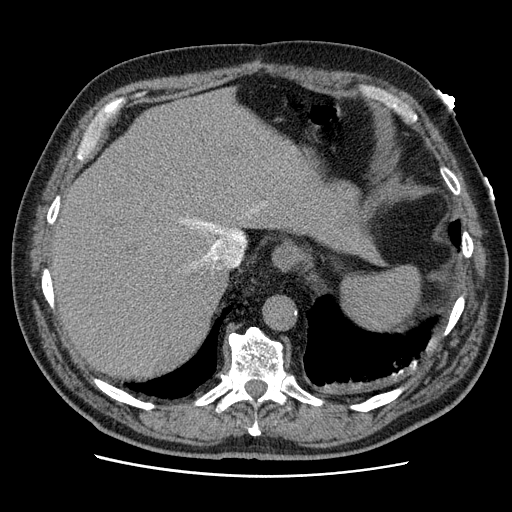
[im 217/521  lung]
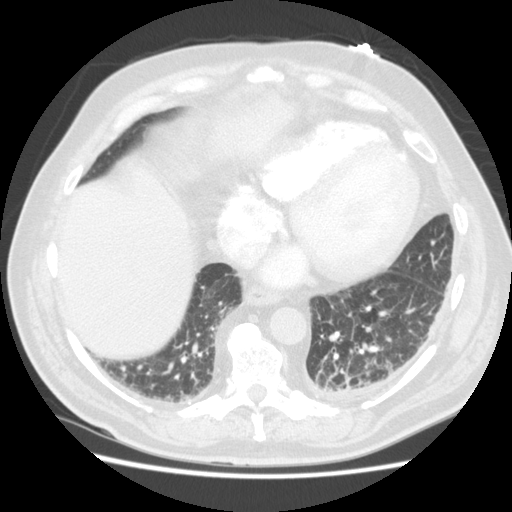
[im 245/521  mediastinal]
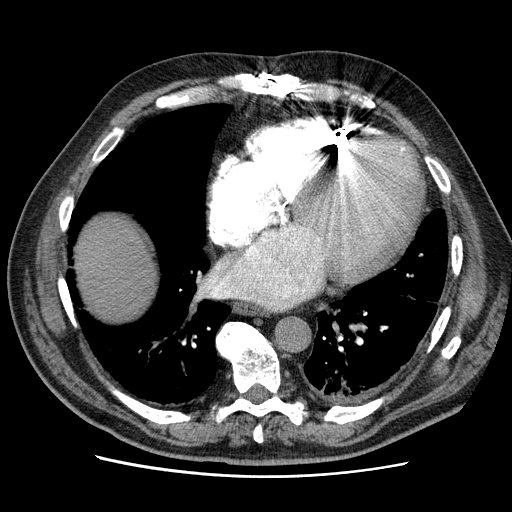
[im 261/521  lung]
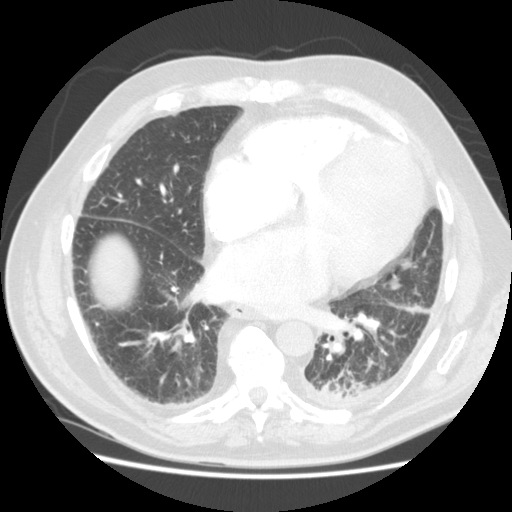
[im 304/521  mediastinal]
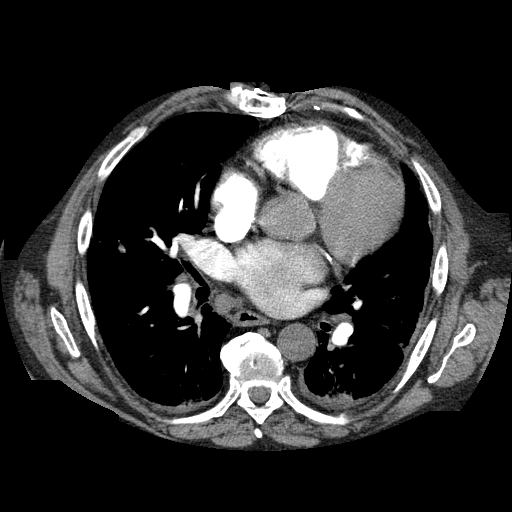
[im 347/521  lung]
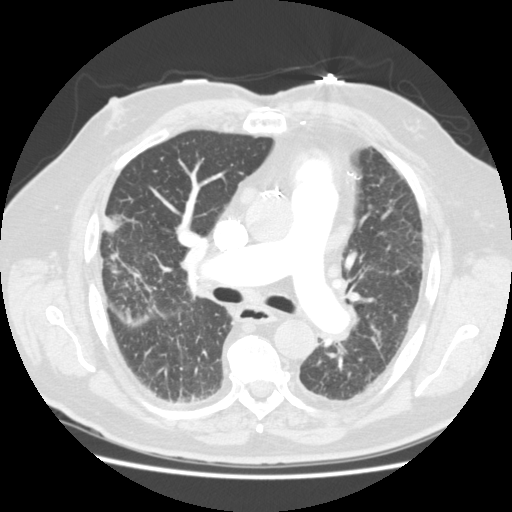
[im 391/521  mediastinal]
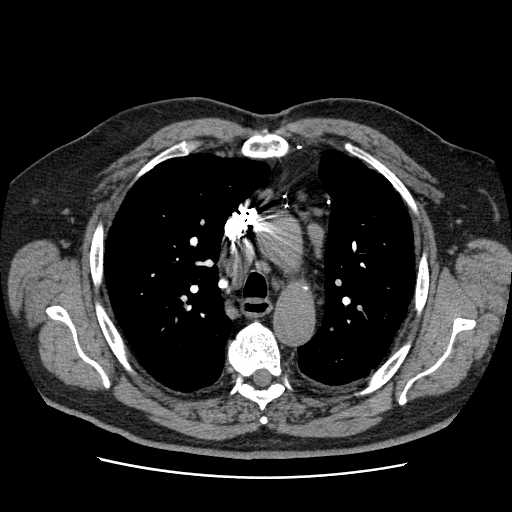
[im 434/521  lung]
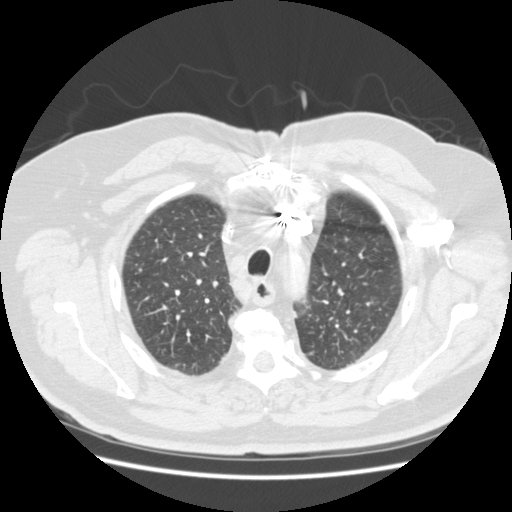
[im 477/521  mediastinal]
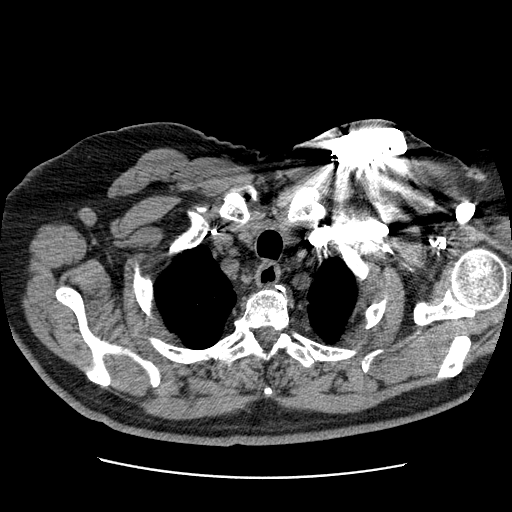

[12 of 31 positions shown; findings below may reference images not displayed]

There are no filling defects in the pulmonary arterial system to suggest pulmonary emboli.  Cardiomegaly, CABG, median sternotomy are stable.  There is no evidence of aortic aneurysm but aortic dissection is not excluded as this study was performed for evaluation of pulmonary emboli and the aorta is not opacified with contrast.  Enlarged mediastinal and hilar lymph nodes are stable.  Tiny bilateral pleural effusions are noted.  Chronic interstitial opacities and peribronchial thickening and scattered areas of scarring are stable from the prior study.  Tiny left pleural effusion is noted.  Left renal cyst, atherosclerotic calcification at the origin of the celiac artery, renal arteries and SMA are noted.  Remainder of the visualized upper abdomen is unremarkable.  Pacemaker/AICD again noted.
IMPRESSION: 1.  No acute abnormality.  No evidence of pulmonary emboli. 
 2.  Stable cardiomegaly, enlarged mediastinal lymph nodes, and chronic interstitial opacity/peribronchial thickening.

## 2006-02-24 IMAGING — CR DG CHEST 1V PORT
1 series · 1 of 1 positions shown · non-contrast
Comparison: 05/15/04.

CLINICAL DATA: Midchest pain with shortness of breath. 
 PORTABLE CHEST:

[view not recorded]
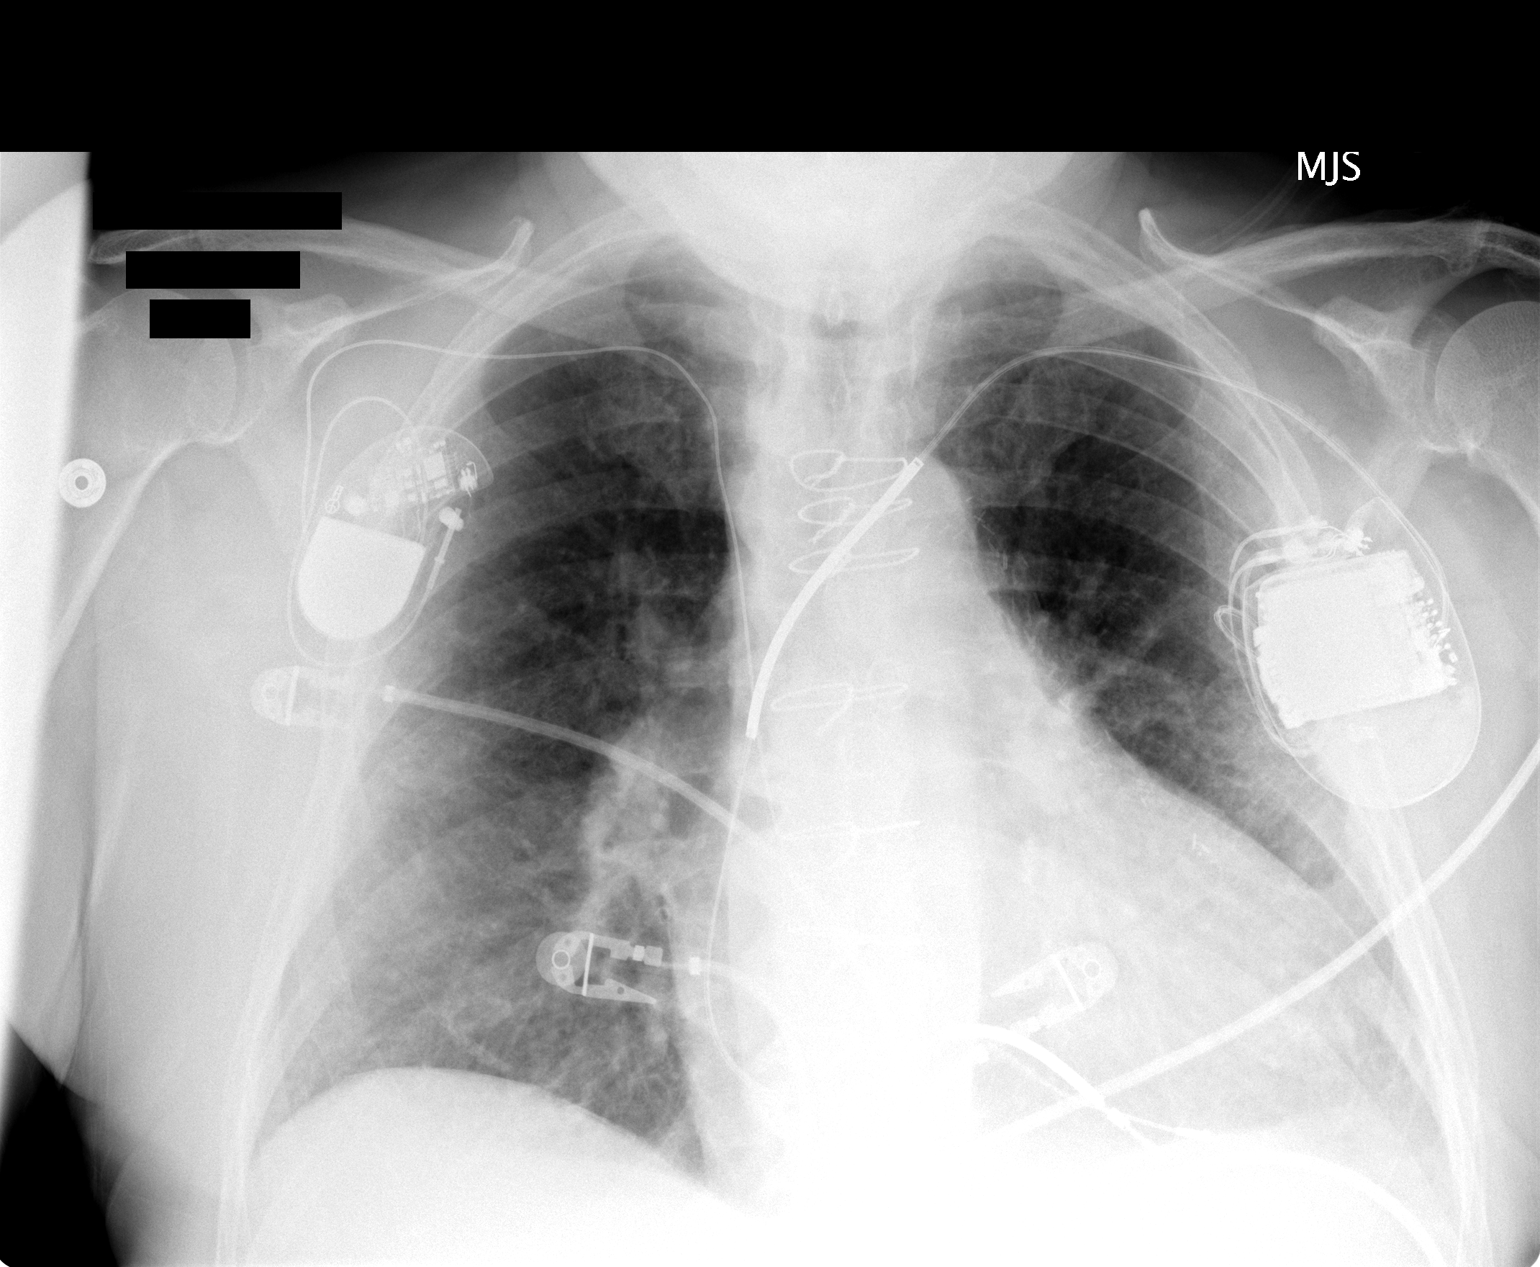

[1 of 1 positions shown; findings below may reference images not displayed]

Cardiac enlargement with left ventricular hypertrophic change without change.  Postsurgical changes secondary to median sternotomy and CABG procedure noted.  Cardiac pacers are stable in position.  Diffuse interstitial changes appear chronic.  There is no definite edema.
IMPRESSION: Cardiac enlargement without change.  Chronic interstitial changes.

## 2006-04-29 ENCOUNTER — Emergency Department (HOSPITAL_COMMUNITY): Admission: EM | Admit: 2006-04-29 | Discharge: 2006-04-30 | Payer: Self-pay | Admitting: Emergency Medicine

## 2006-05-04 IMAGING — CT CT PELVIS W/O CM
1 series · 15 of 32 positions shown, 19 images · non-contrast
Comparison: 02/02/2003

CLINICAL DATA: Abdomen and flank pain.
TECHNIQUE: Multidetector CT imaging of the abdomen and pelvis was performed
following the standard protocol without IV contrast.

[Series 2: abdomen 5.0 b30f · axial · 0.69mm/px · z∈[-330,+20]mm · 15 of 79 slices shown, 19 images]
[im 6/79  soft-tissue]
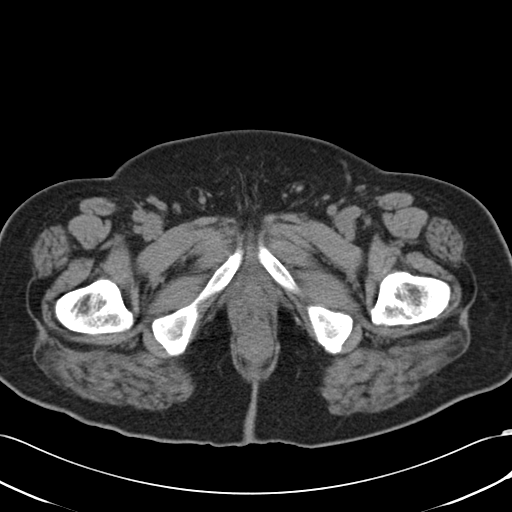
[im 6/79  bone]
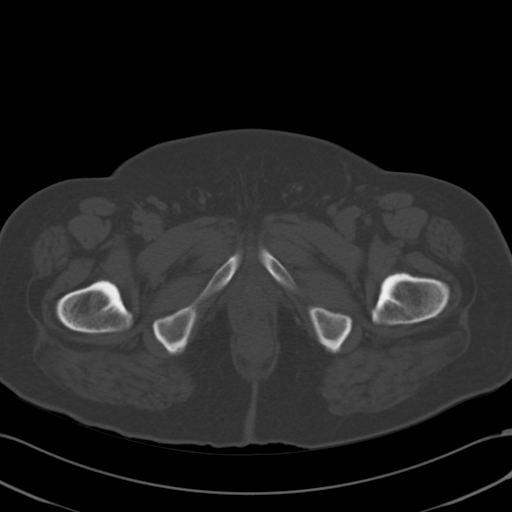
[im 11/79  soft-tissue]
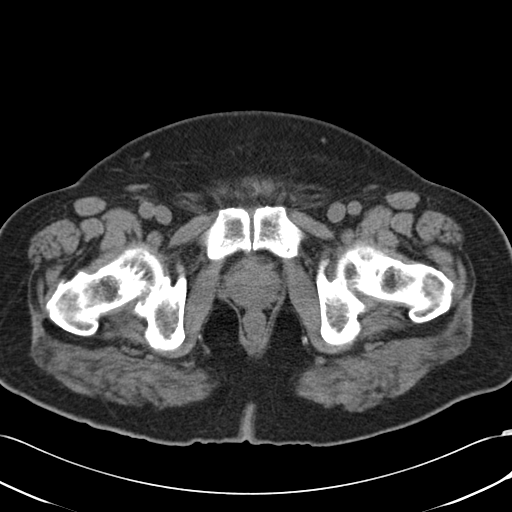
[im 16/79  soft-tissue]
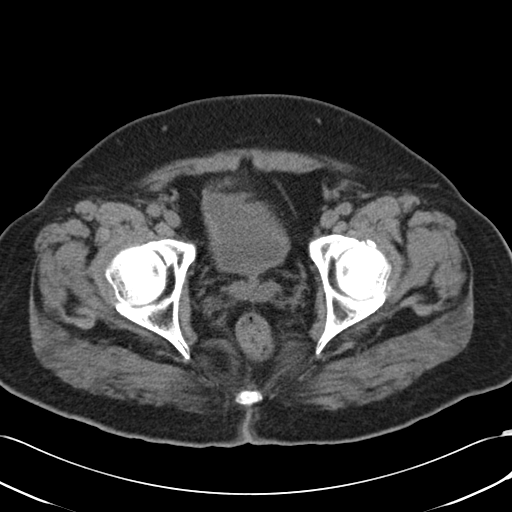
[im 23/79  soft-tissue]
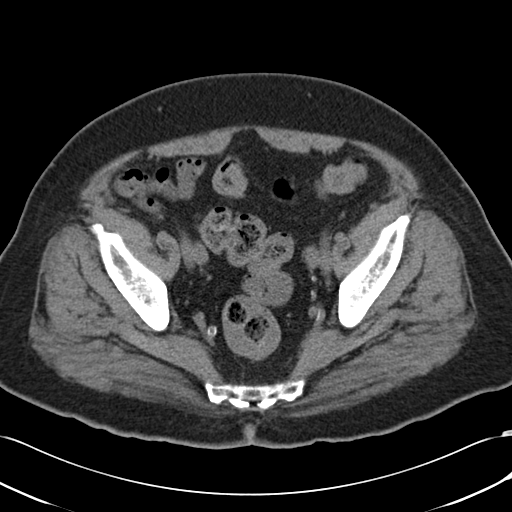
[im 28/79  soft-tissue]
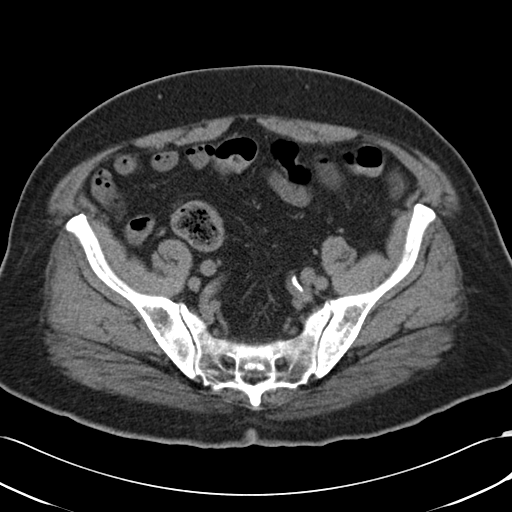
[im 33/79  soft-tissue]
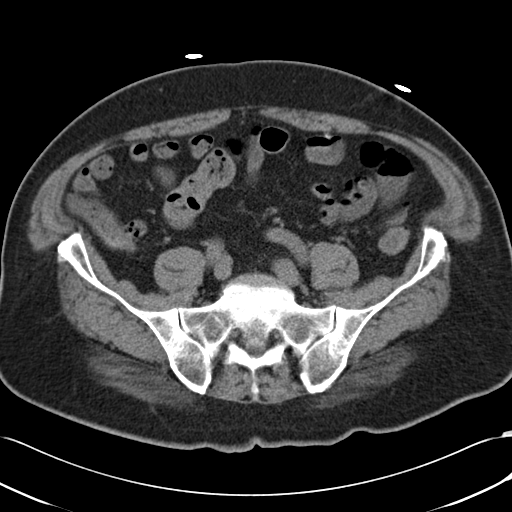
[im 41/79  soft-tissue]
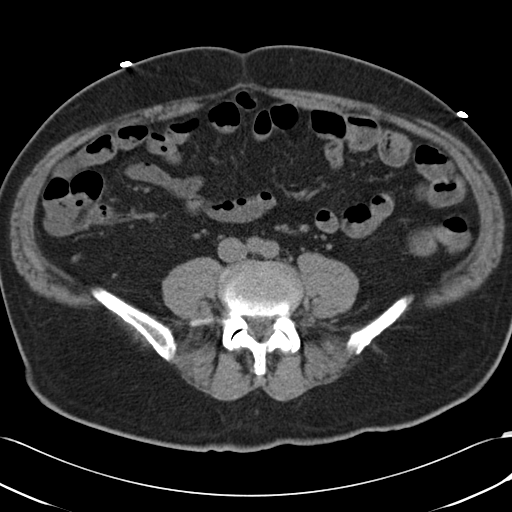
[im 46/79  soft-tissue]
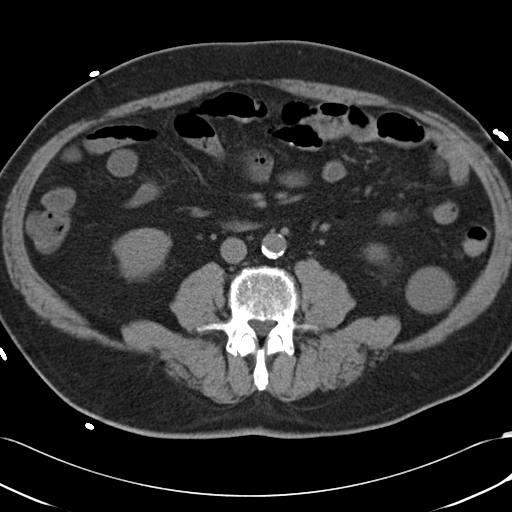
[im 51/79  soft-tissue]
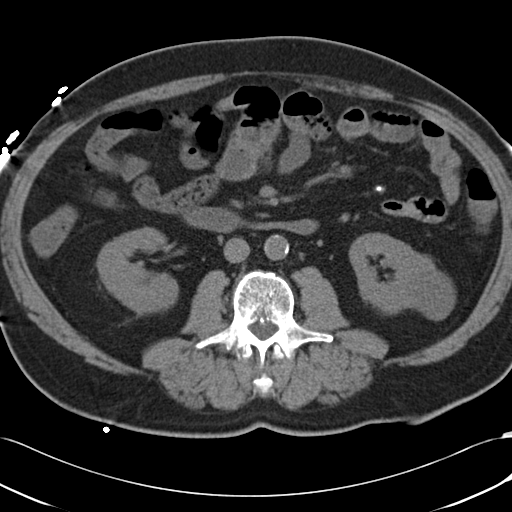
[im 51/79  bone]
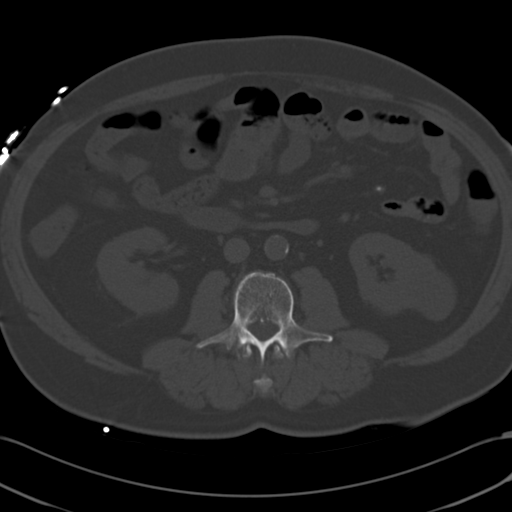
[im 56/79  soft-tissue]
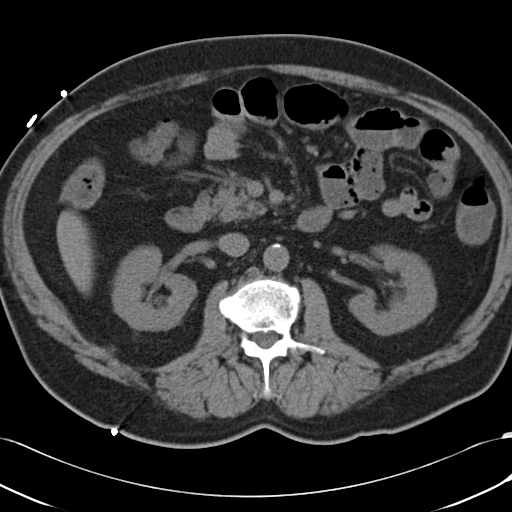
[im 63/79  soft-tissue]
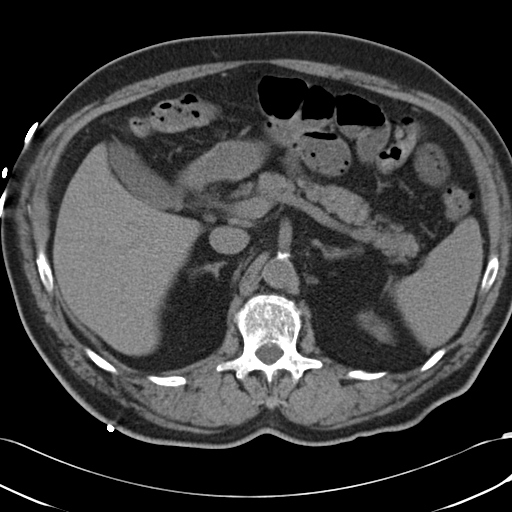
[im 68/79  soft-tissue]
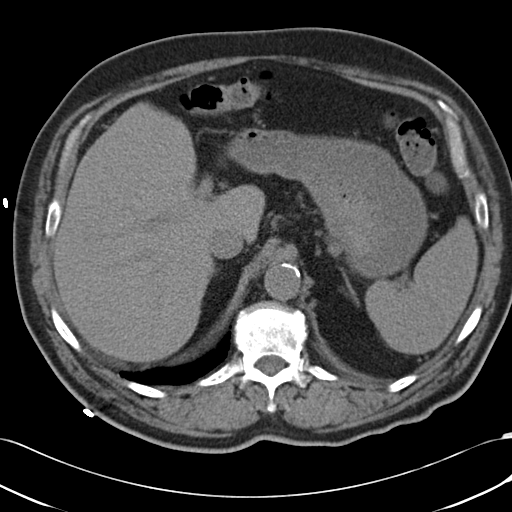
[im 68/79  lung]
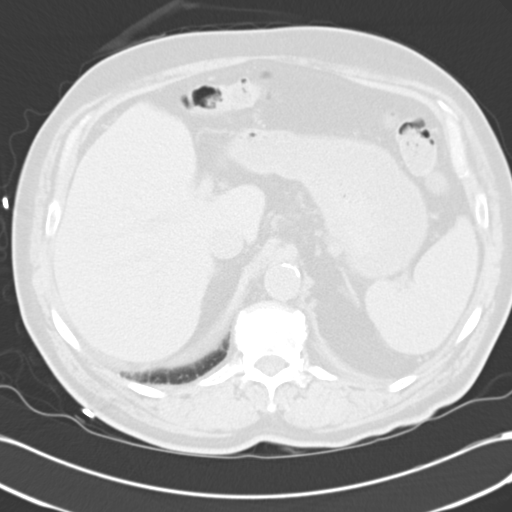
[im 71/79  lung]
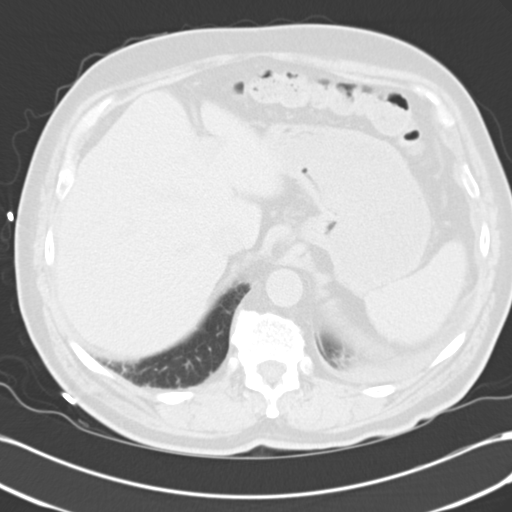
[im 73/79  soft-tissue]
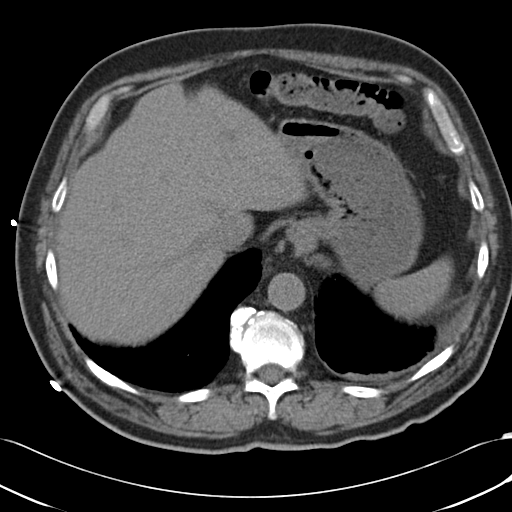
[im 73/79  lung]
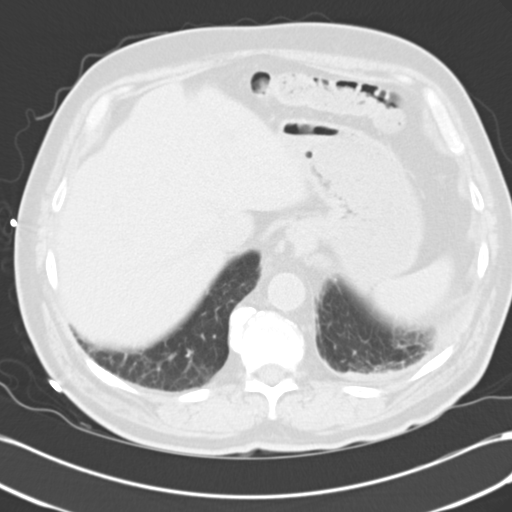
[im 76/79  lung]
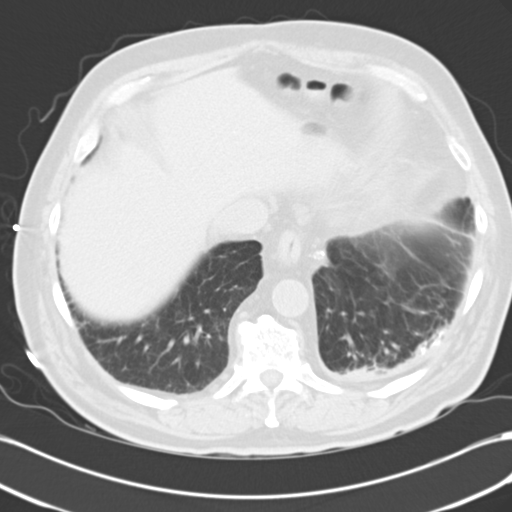

[15 of 32 positions shown; findings below may reference images not displayed]

ABDOMEN CT WITHOUT CONTRAST:

Calcified pleural plaque in the posterior left hemithorax is unchanged.
Calcified granuloma seen in the right lower lobe.

Imaged portions of the liver and spleen have normal uninfused features. Stomach,
duodenum, pancreas, gallbladder, and adrenal glands are unremarkable. 4.0 cm
exophytic lesion from the lower pole of the left kidney is unchanged in the
nearly 2 year interval between these exams. No left renal stones or left
hydronephrosis. 4 mm nonobstructing stone in the lower pole the right kidney is
new in the interval. No perinephric edema on the right.

No free fluid or lymphadenopathy. No abdominal aortic aneurysm.
IMPRESSION: 4 mm right renal stone is nonobstructing. There are no secondary changes in
either kidney to suggest recent stone passage.

4 cm exophytic cystic lesion from the left kidney is unchanged in a nearly 2
year interval between the 2 studies. This is probably a cyst complicated by
proteinaceous debris or hemorrhage.

PELVIS CT WITHOUT CONTRAST:

No free fluid or lymphadenopathy. No evidence for diverticulitis. Terminal ileum
and appendix abnormal features.
IMPRESSION: No acute findings in the anatomic pelvis.

## 2006-05-05 ENCOUNTER — Emergency Department (HOSPITAL_COMMUNITY): Admission: EM | Admit: 2006-05-05 | Discharge: 2006-05-05 | Payer: Self-pay | Admitting: Emergency Medicine

## 2006-05-19 IMAGING — CR DG CHEST 2V
2 series · 2 of 2 positions shown · non-contrast
Comparison: Portable chest 07/26/04 and 2-view chest 05/15/04.

CLINICAL DATA: Short of breath/blood in stool/lung cancer.
 [id number] VIEW:

[view not recorded (1 of 2)]
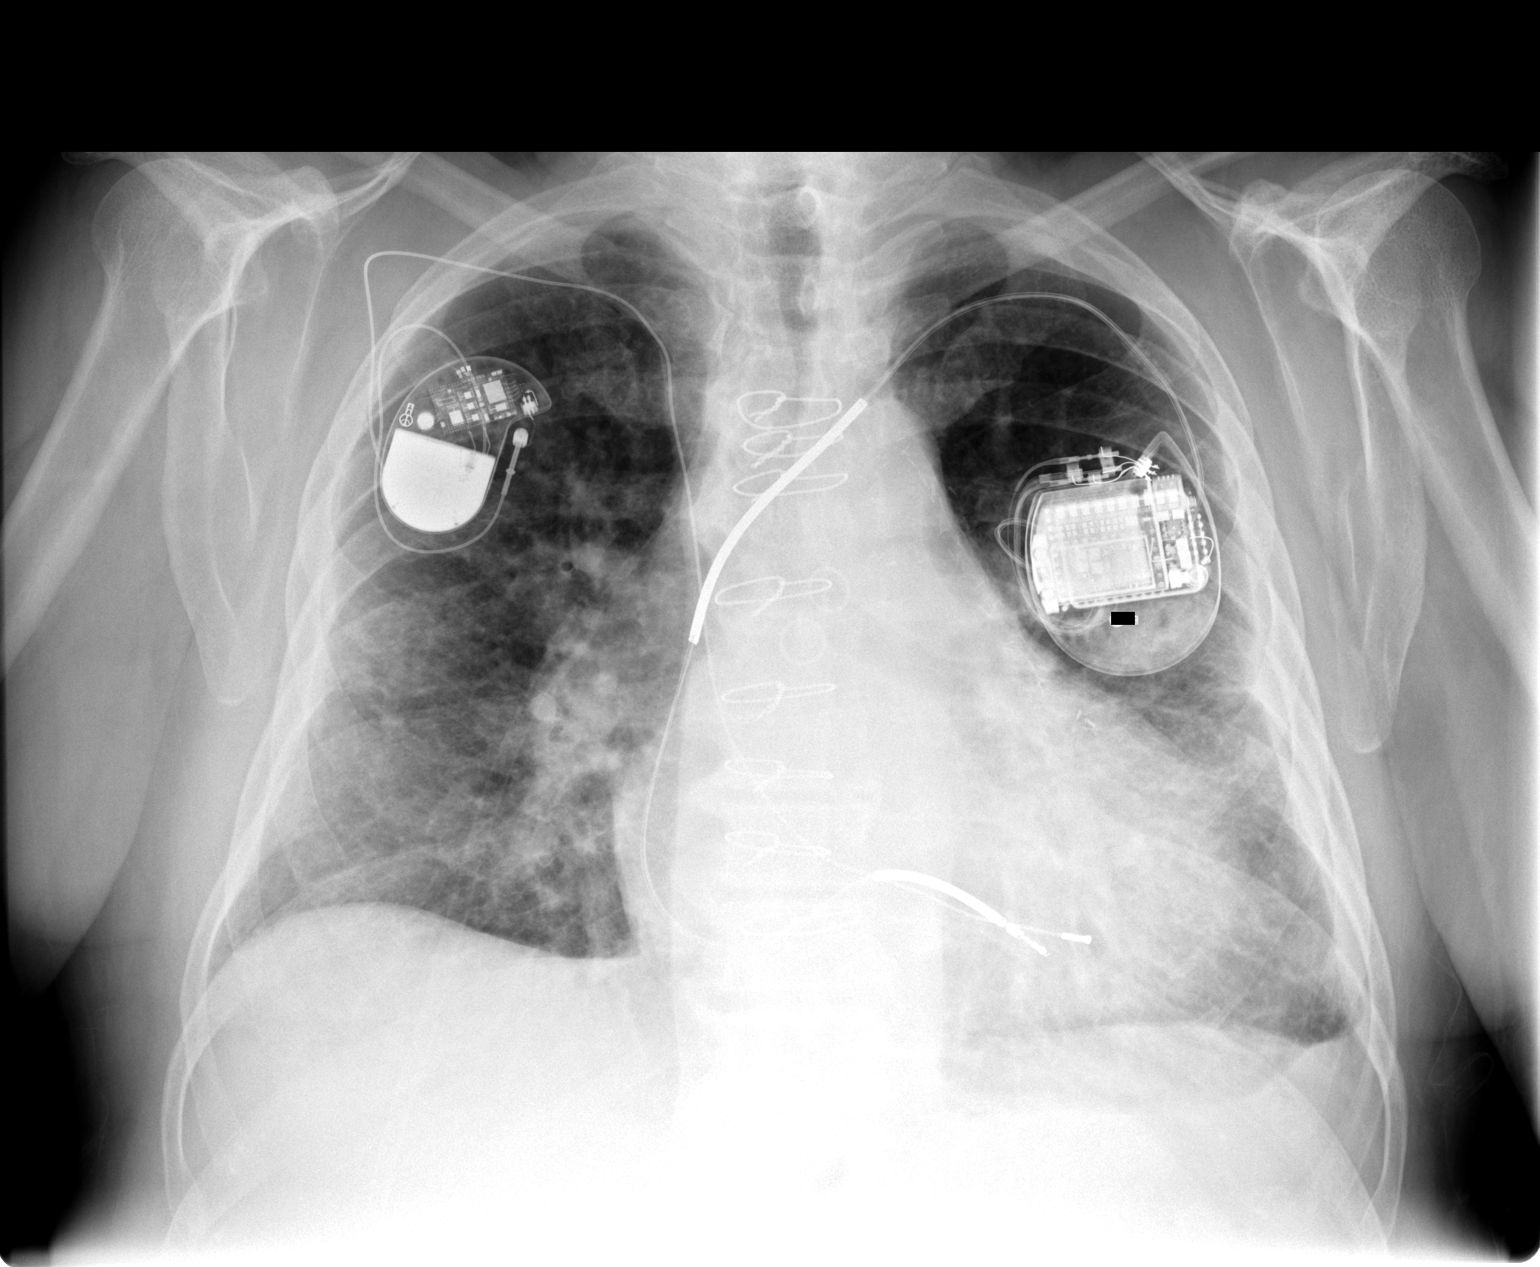

[view not recorded (2 of 2)]
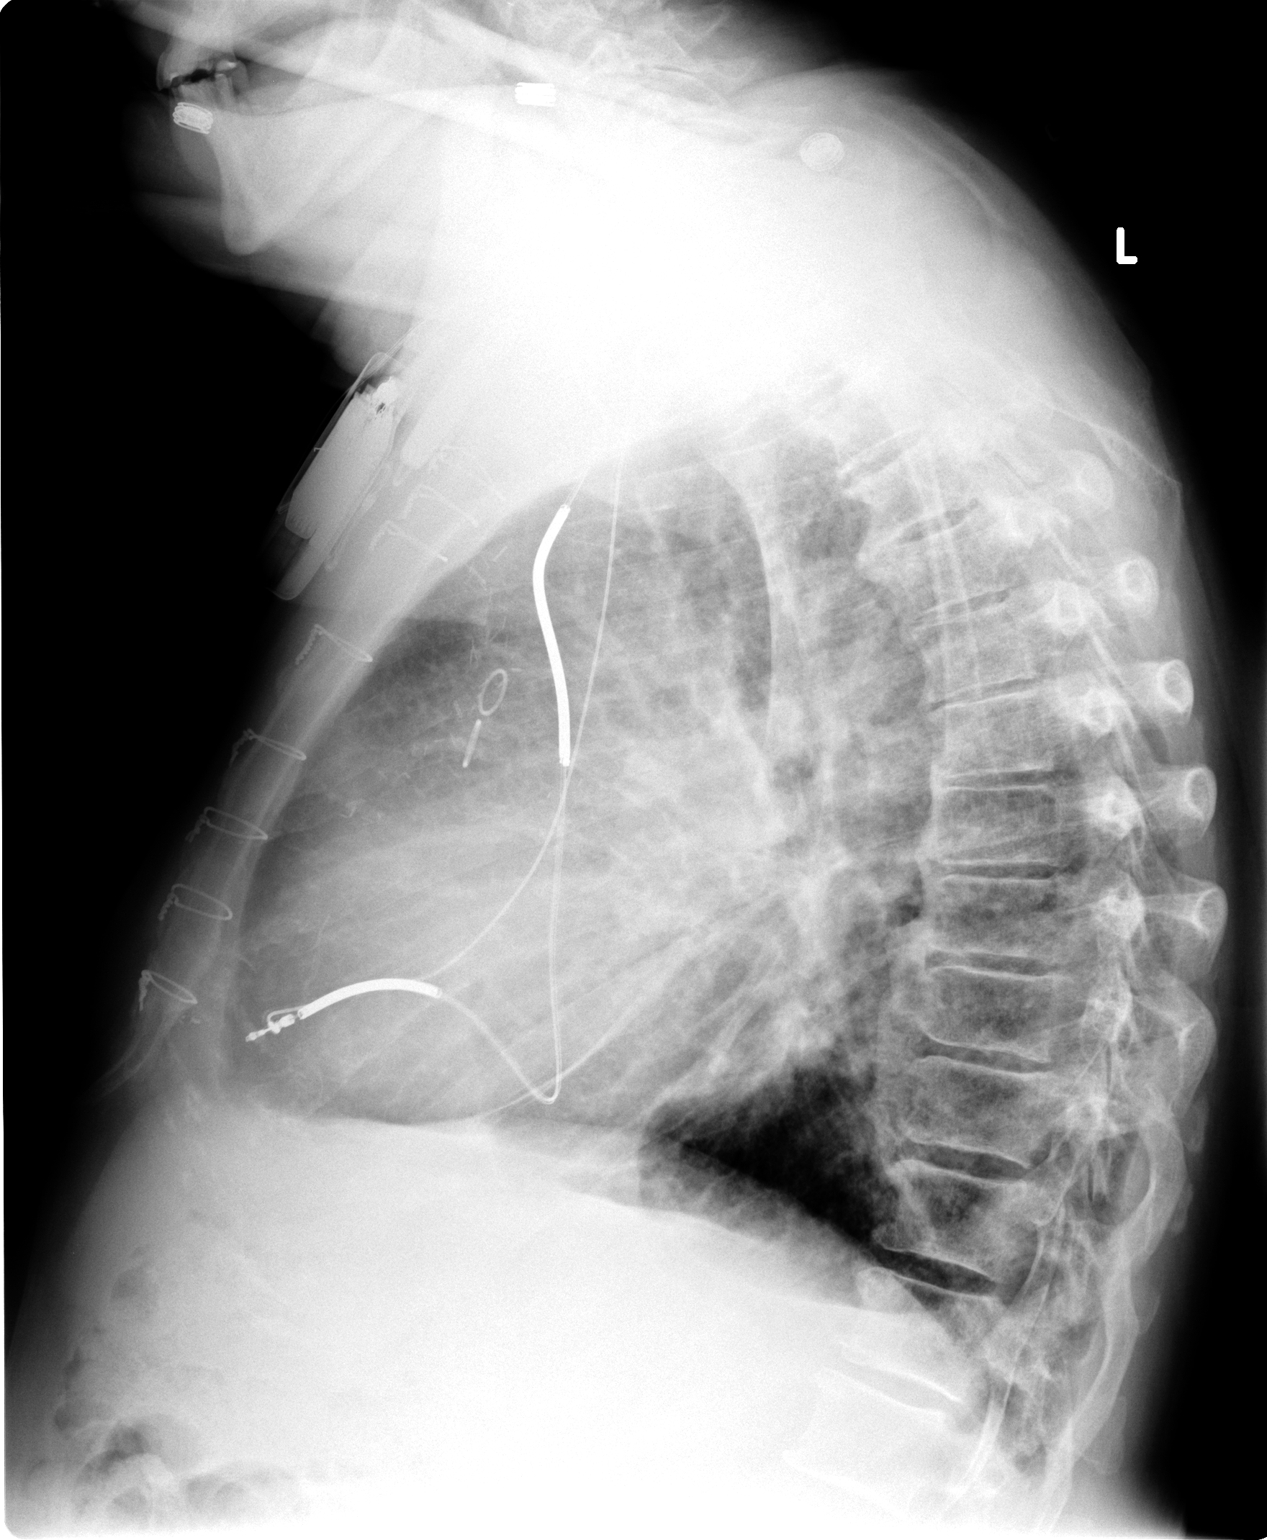

[2 of 2 positions shown; findings below may reference images not displayed]

Heart moderately enlarged.  Abnormal interstitial markings which are chronic and unchanged.  There is a right subclavian permanent cardiac pacer and a left subclavian AICD.  No change in the position of the leads.  
 Prior CABG.
 Lungs hyperaerated but unchanged.
IMPRESSION: 1.  Cardiomegaly with prior CABG and both permanent cardiac pacer and AICD.
 2.  Chronic interstitial lung disease--no definite acute process or interval change.

## 2006-05-31 ENCOUNTER — Inpatient Hospital Stay (HOSPITAL_COMMUNITY): Admission: EM | Admit: 2006-05-31 | Discharge: 2006-06-04 | Payer: Self-pay | Admitting: Emergency Medicine

## 2006-06-26 ENCOUNTER — Encounter (INDEPENDENT_AMBULATORY_CARE_PROVIDER_SITE_OTHER): Payer: Self-pay | Admitting: Cardiology

## 2006-06-26 ENCOUNTER — Inpatient Hospital Stay (HOSPITAL_COMMUNITY): Admission: EM | Admit: 2006-06-26 | Discharge: 2006-07-04 | Payer: Self-pay | Admitting: Emergency Medicine

## 2006-06-26 ENCOUNTER — Ambulatory Visit: Payer: Self-pay | Admitting: Cardiology

## 2006-07-17 ENCOUNTER — Ambulatory Visit: Payer: Self-pay

## 2006-09-18 IMAGING — CR DG CHEST 1V PORT
1 series · 1 of 1 positions shown · non-contrast
Comparison: 10/18/04.

CLINICAL DATA: Chest pain/short of breath.
 CHEST PORTABLE - 1 VIEW:

[view not recorded]
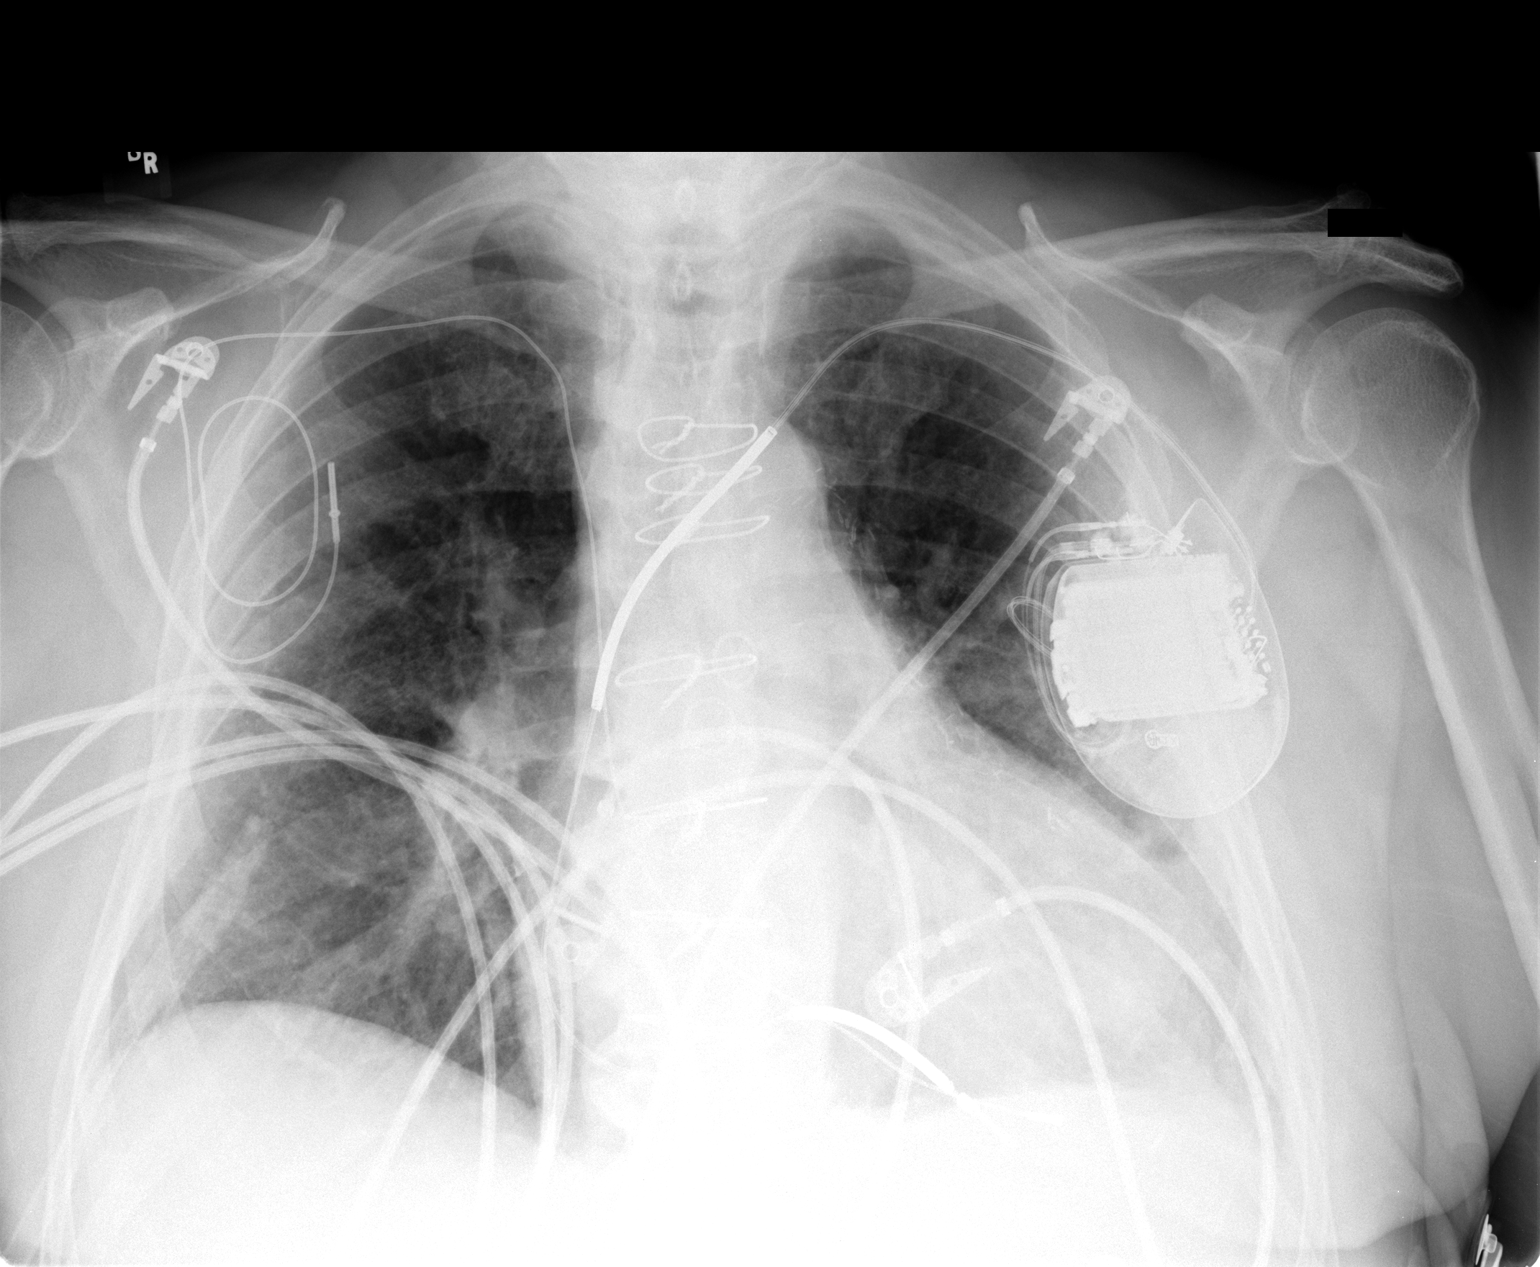

[1 of 1 positions shown; findings below may reference images not displayed]

FINDINGS: AICD and right subclavian lead without generator.  The generator has been removed since the prior study.  
 The heart is enlarged.  There is probable venous hypertension but no frank congestive heart failure or active disease.
IMPRESSION: 1.  AICD.
 2.  Inactive right-sided lead.
 3.  Cardiomegaly with probable venous hypertension--no frank congestive heart failure or active disease.

## 2006-10-15 ENCOUNTER — Ambulatory Visit: Payer: Self-pay | Admitting: Internal Medicine

## 2006-12-04 ENCOUNTER — Inpatient Hospital Stay (HOSPITAL_COMMUNITY): Admission: EM | Admit: 2006-12-04 | Discharge: 2006-12-06 | Payer: Self-pay | Admitting: Emergency Medicine

## 2006-12-18 IMAGING — CR DG CHEST 2V
2 series · 2 of 2 positions shown · non-contrast
Comparison: 10/18/04 and 07/26/04.

CLINICAL DATA: 72 year old with shortness of breath.
 CHEST - 2 VIEW:

[w chest pa]
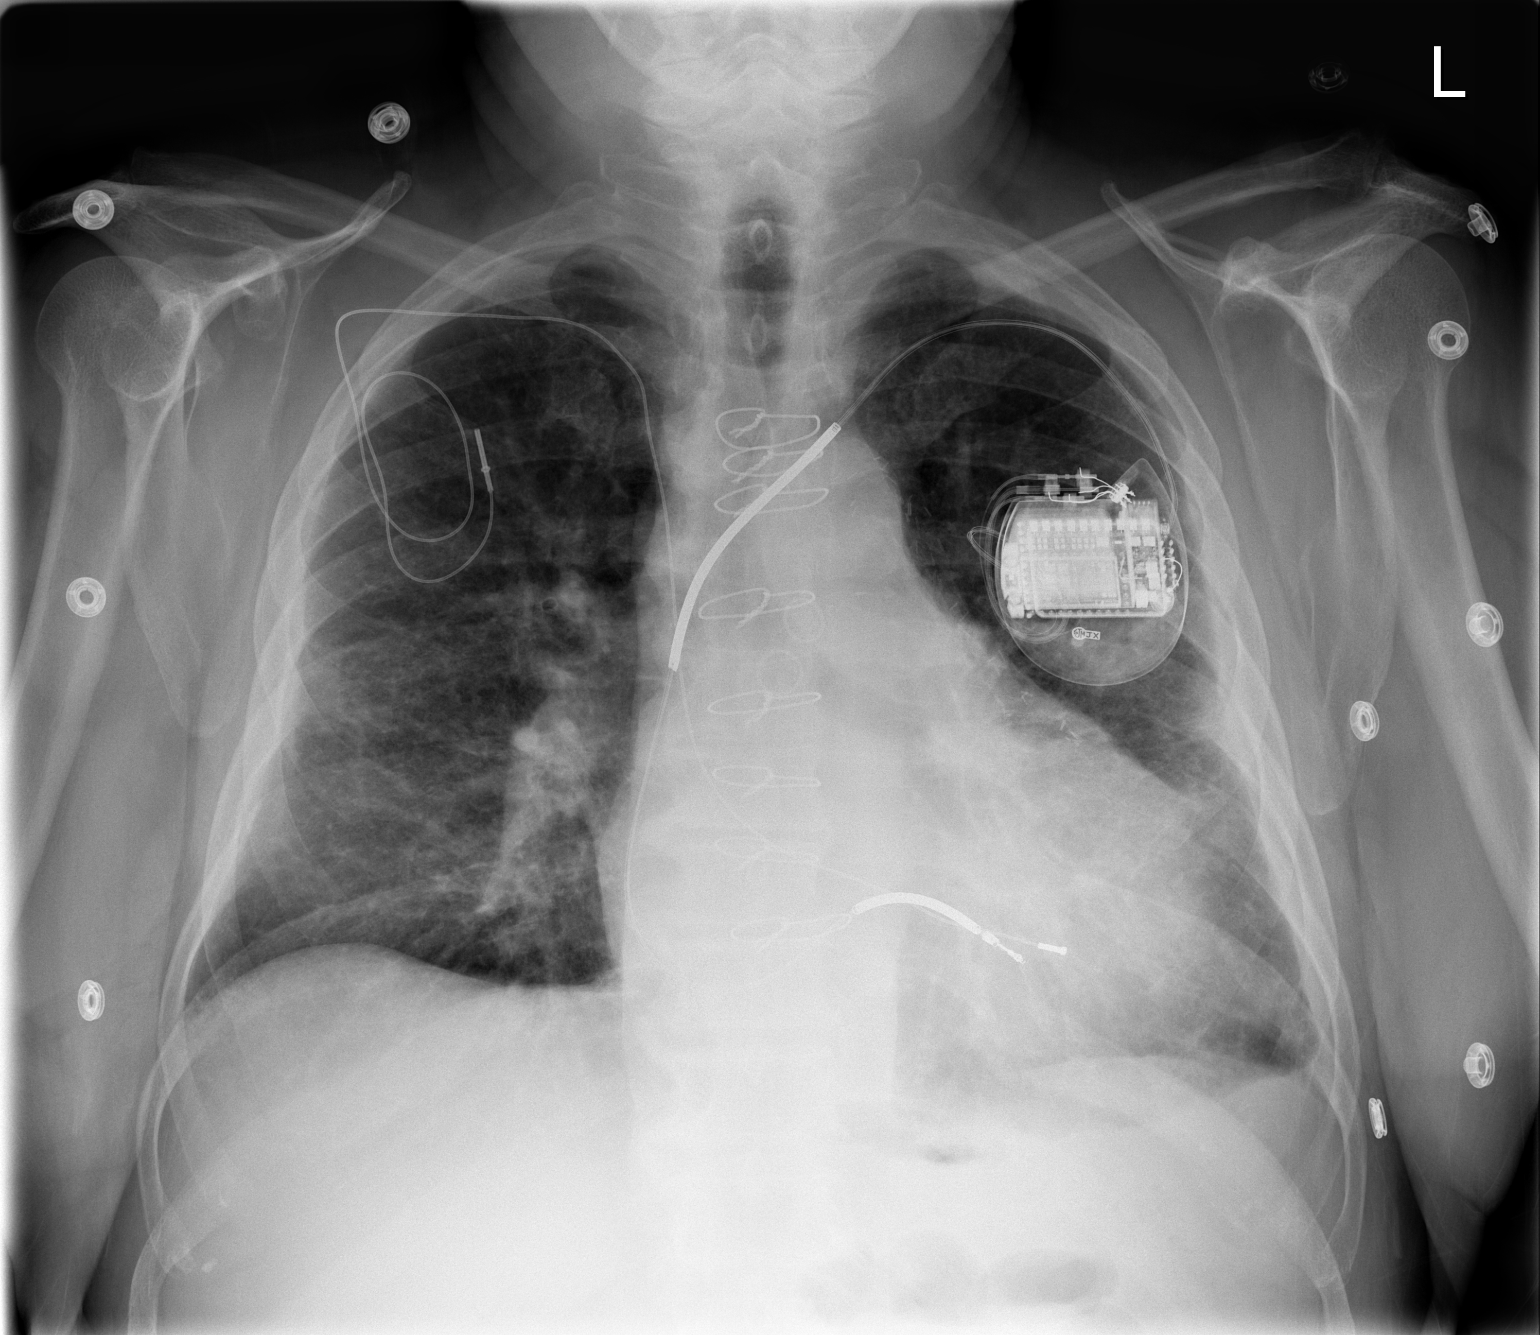

[w chest lat]
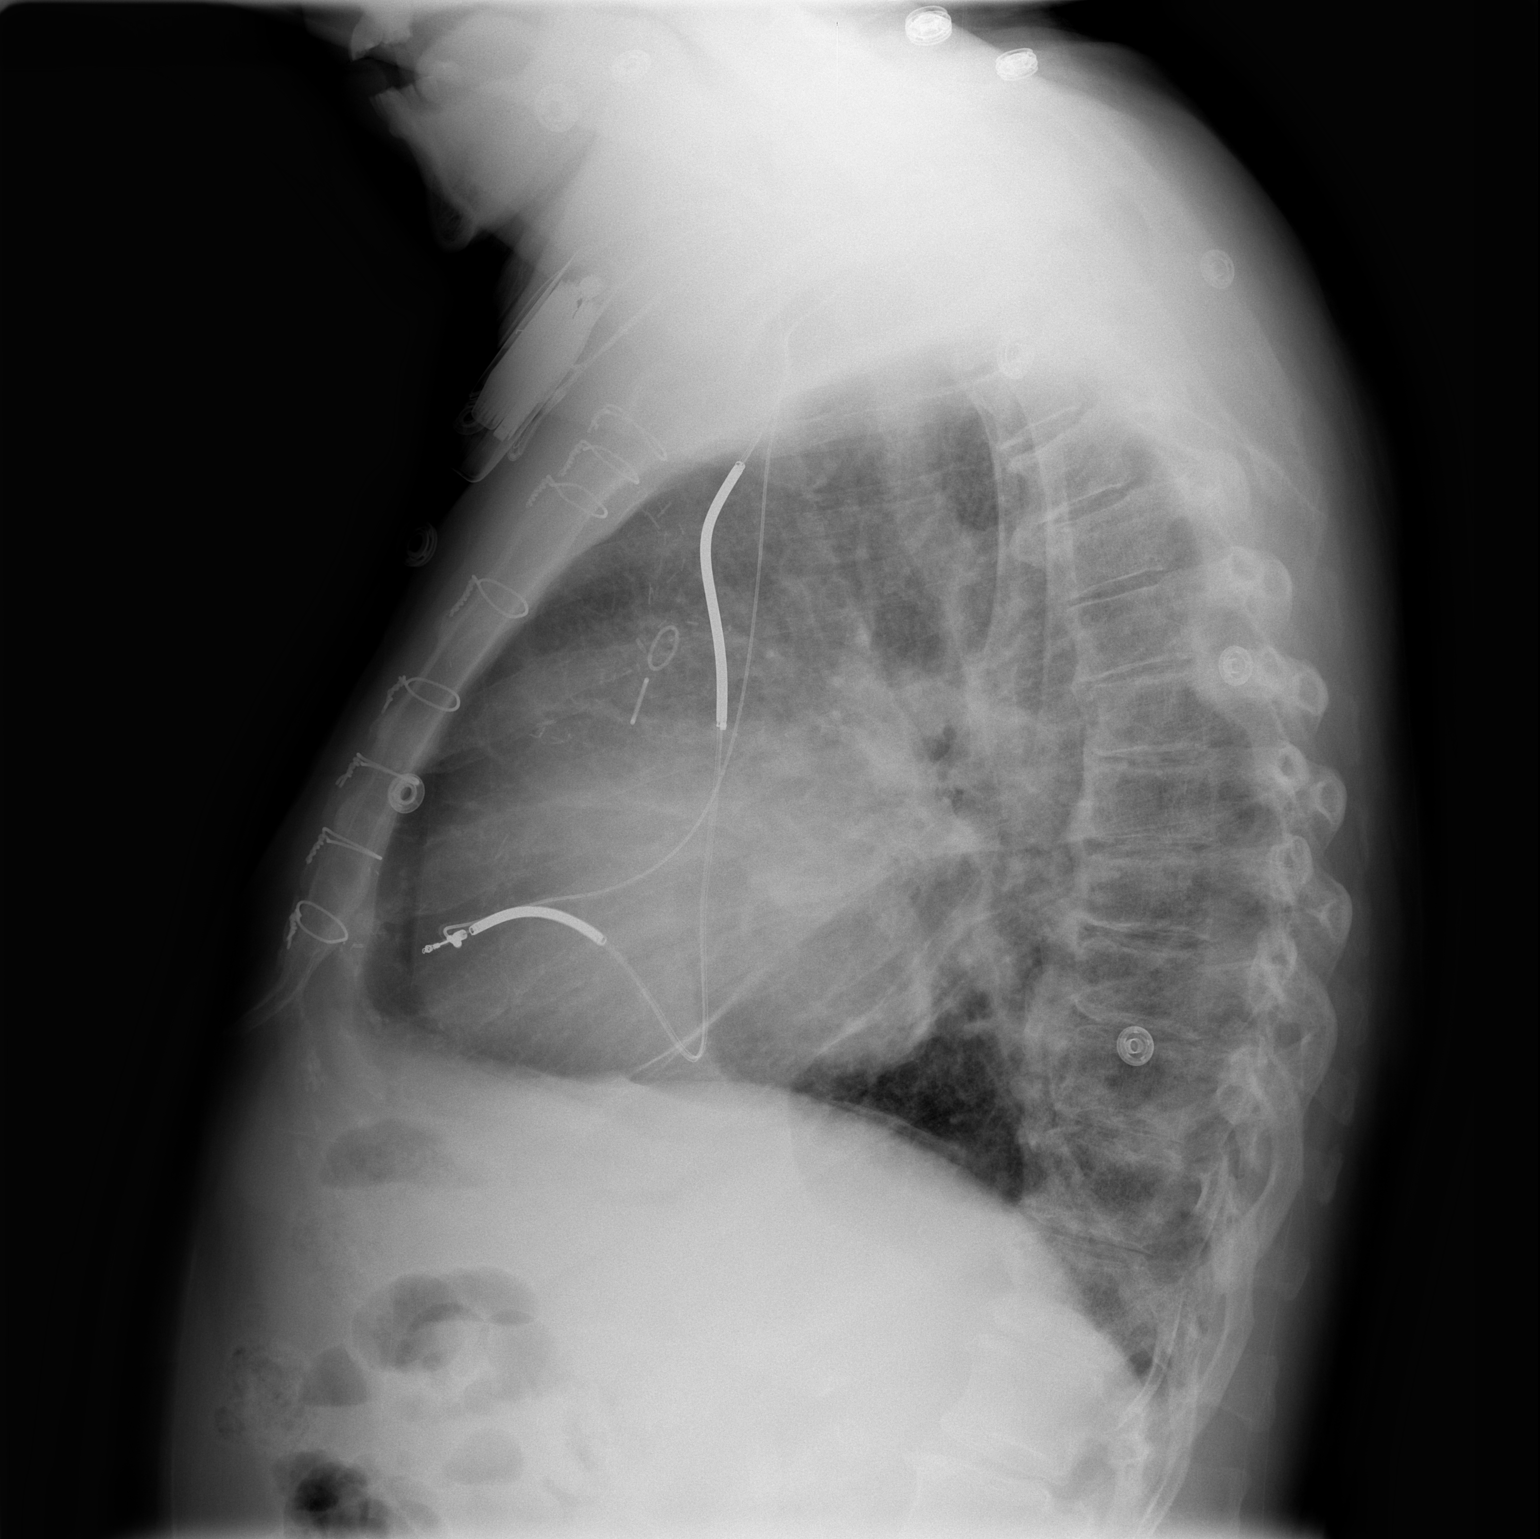

[2 of 2 positions shown; findings below may reference images not displayed]

FINDINGS: The heart is mildly enlarged but stable.  There are stable pacer wires and AICD.  The permanent right-sided pacemaker has been removed.  There is chronic pleural thickening on the left side.  No pleural effusion is seen.  
 Chronic interstitial disease without definite acute overlying pulmonary process.  Stable scarring changes in both lower lung zones.  Stable surgical changes related to double bypass surgery.
IMPRESSION: 1.  Chronic lung changes. No definite acute overlying pulmonary process.
 2.  Cardiac enlargement, stable.

## 2006-12-21 IMAGING — CT CT ANGIO CHEST
3 of 4 series · 19 of 36 positions shown · IV contrast (omnipaque)
Comparison: none

CLINICAL DATA: Chest pain.  Heart failure.
CT ANGIOGRAPHY OF CHEST:
TECHNIQUE: Multidetector CT imaging of the chest was performed during bolus injection of intravenous contrast.  Multiplanar CT angiographic image reconstructions were generated to evaluate the vascular anatomy.
Contrast:  80cc Omnipaque 300.

[Series 5: pulm embolism 2.0 b31f st · axial · 0.71mm/px · z∈[-304,-32]mm · 13 of 160 slices shown]
[im 12/160  lung]
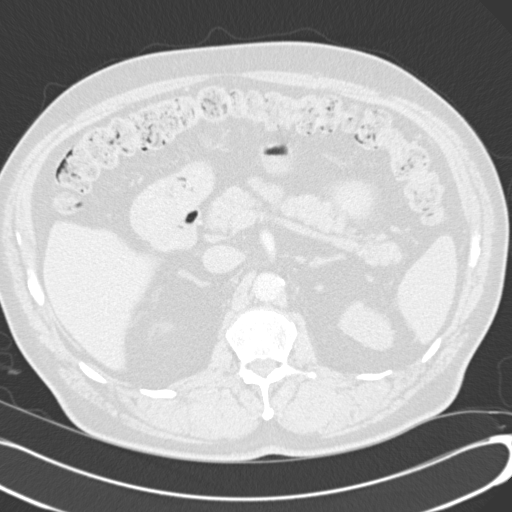
[im 23/160  mediastinal]
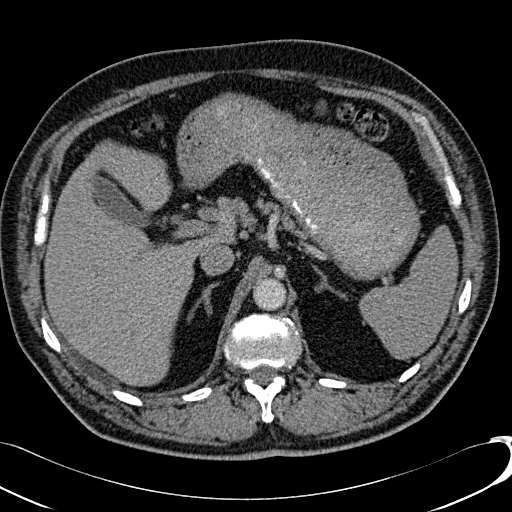
[im 35/160  lung]
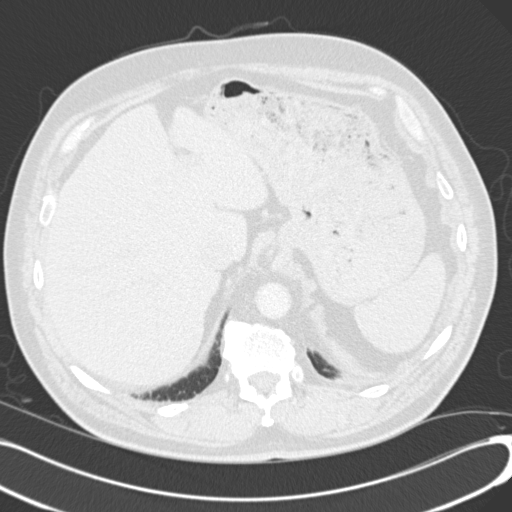
[im 46/160  mediastinal]
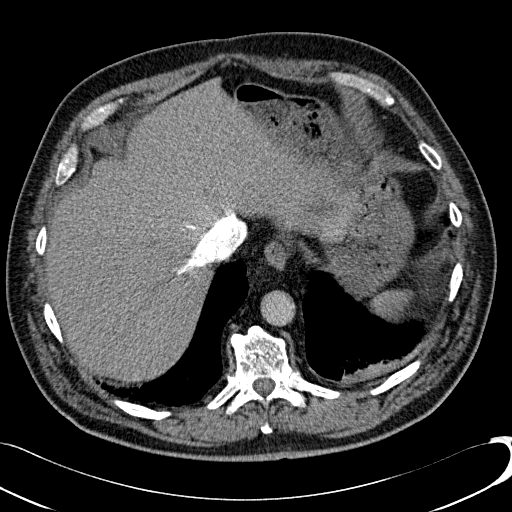
[im 57/160  lung]
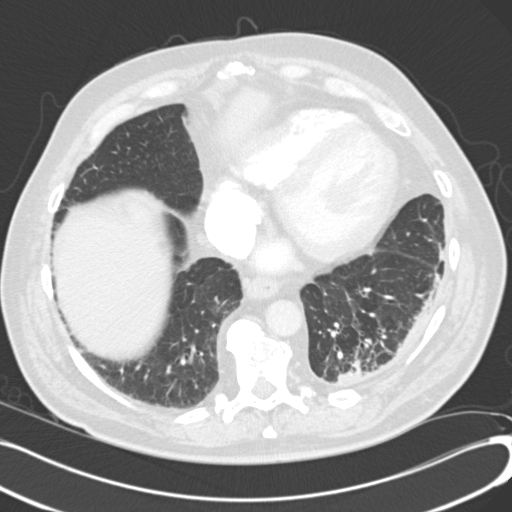
[im 69/160  mediastinal]
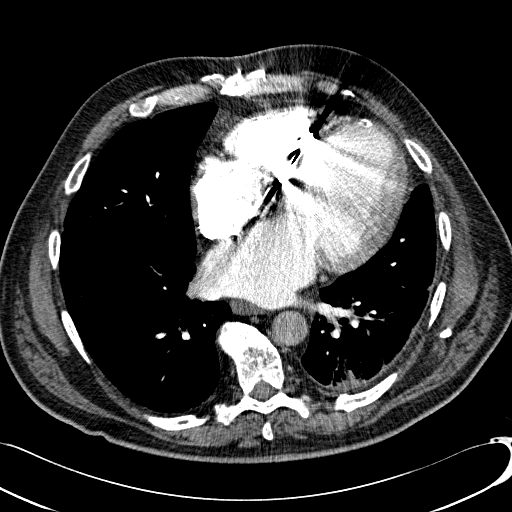
[im 80/160  lung]
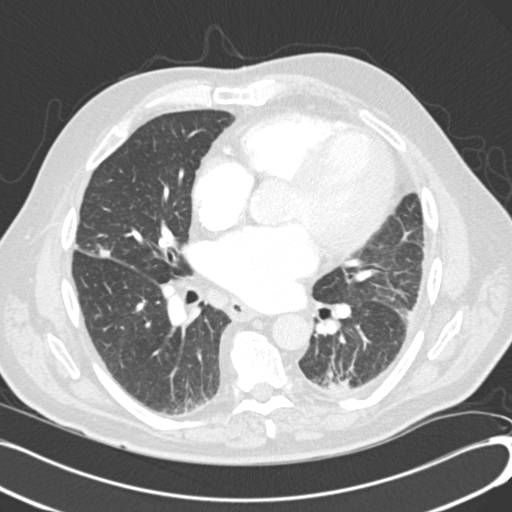
[im 91/160  mediastinal]
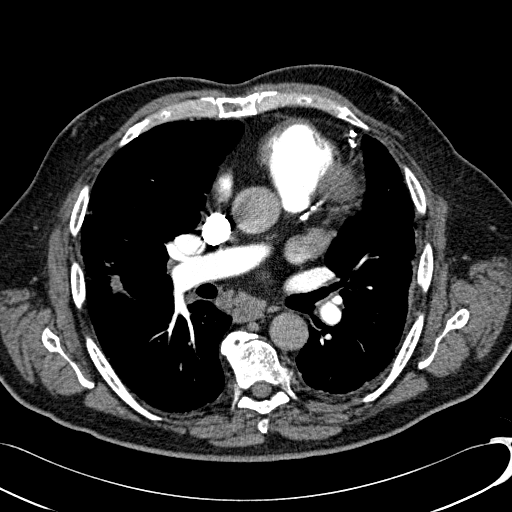
[im 103/160  lung]
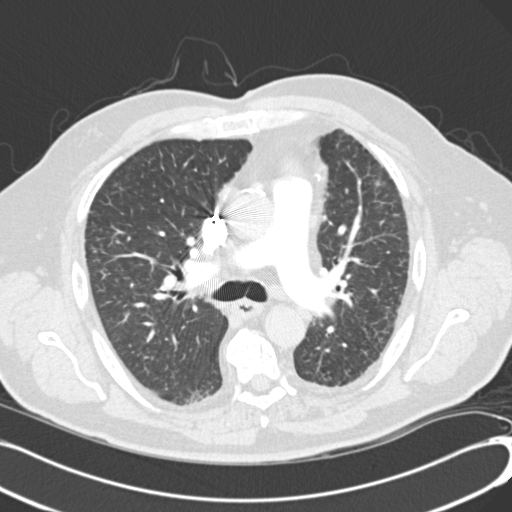
[im 114/160  mediastinal]
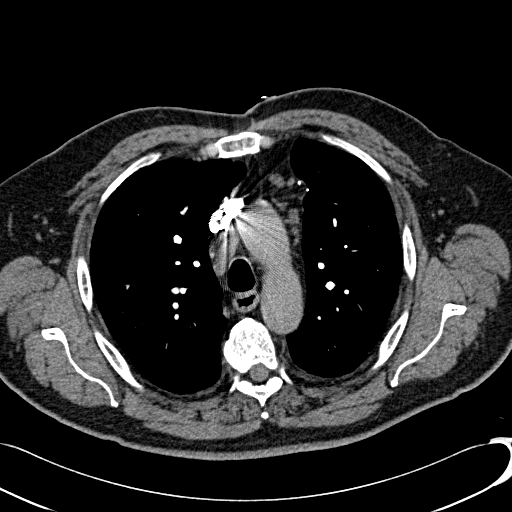
[im 125/160  lung]
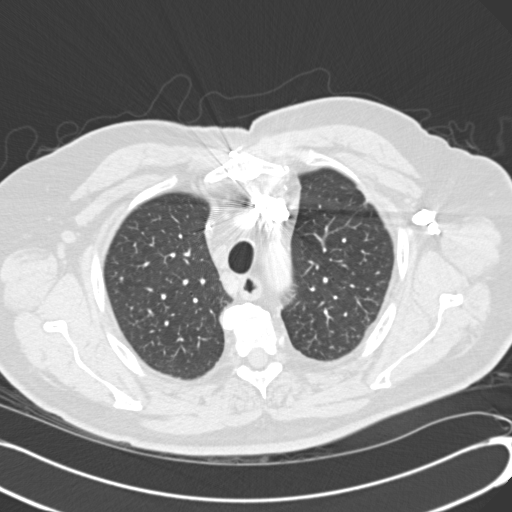
[im 137/160  mediastinal]
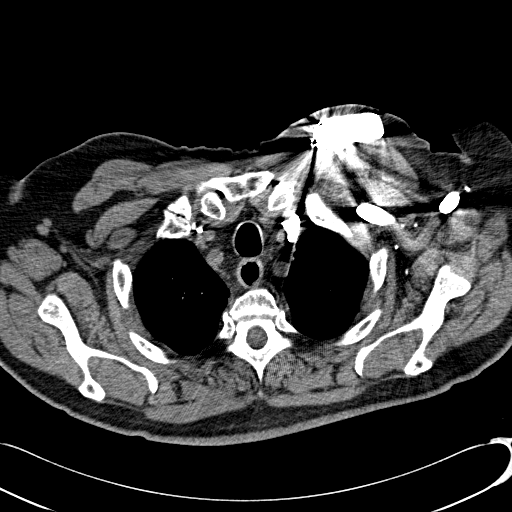
[im 148/160  lung]
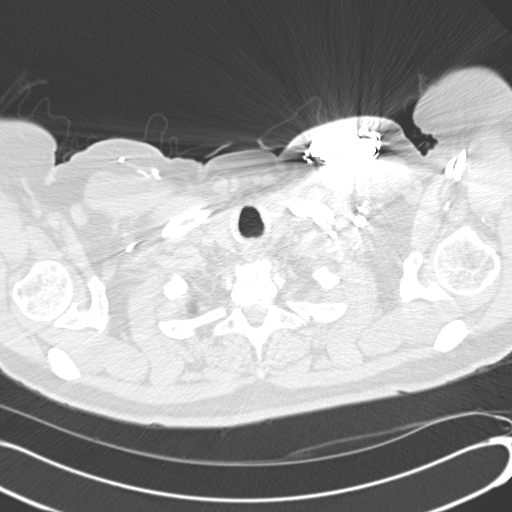

[Series 6: pulm embolism 2.0 b60f lung · axial · 0.71mm/px · z∈[-268,-196]mm · 3 of 142 slices shown]
[im 12/142  mediastinal]
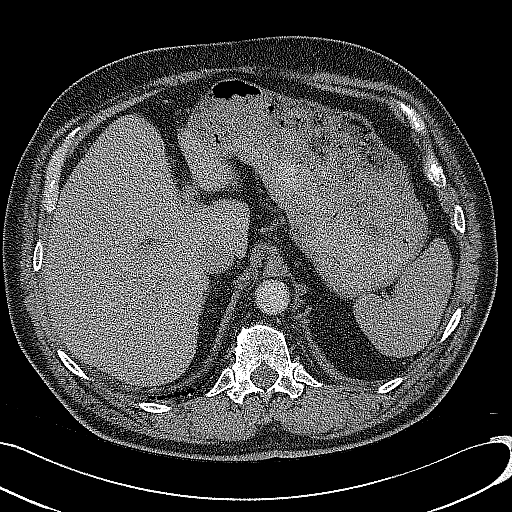
[im 36/142  mediastinal]
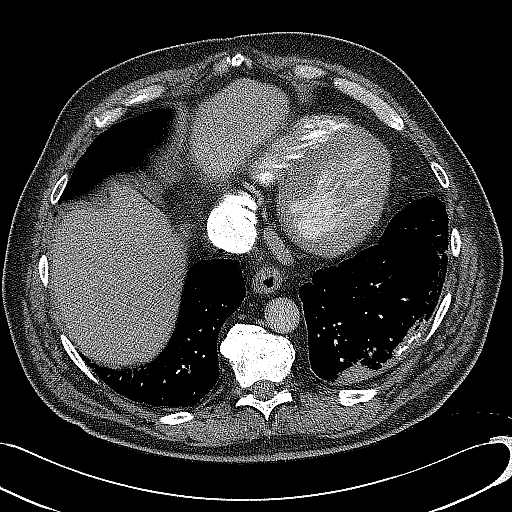
[im 48/142  mediastinal]
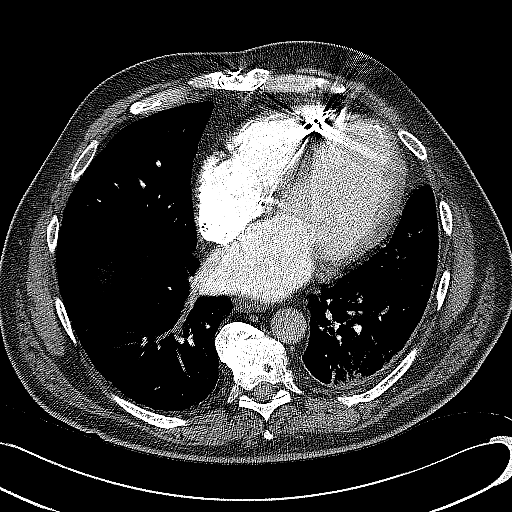

[Series 602: coronal · coronal · 0.71mm/px · 3 of 126 slices shown]
[im 26/126  mediastinal]
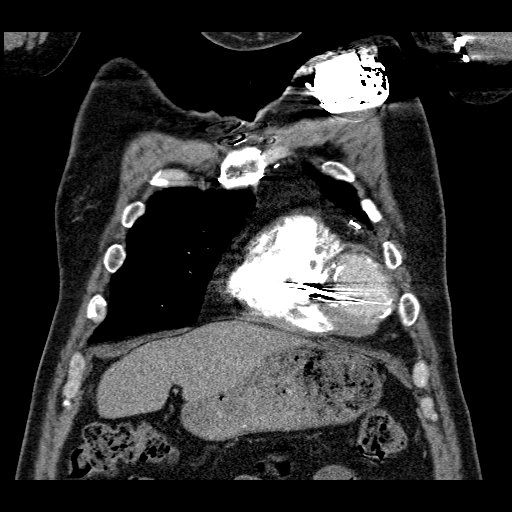
[im 51/126  mediastinal]
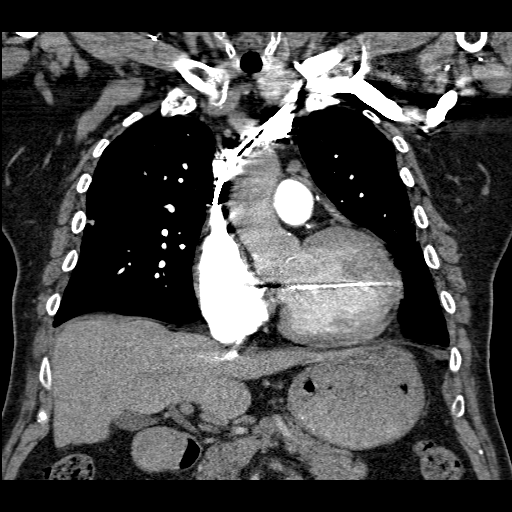
[im 76/126  mediastinal]
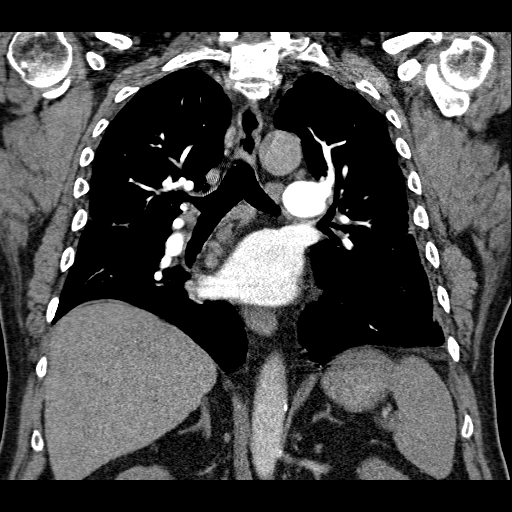

[19 of 36 positions shown; findings below may reference images not displayed]

FINDINGS: There is no evidence of pulmonary embolus.  The patient does have mediastinal adenopathy which is essentially unchanged since 07/26/2004.  The patient has chronic interstitial disease in the periphery of both lungs particularly at the bases with some scarring along the major fissure in the right mid-zone, which is stable.  Extensive degenerative spurs are present throughout the thoracic spine and at the costovertebral junctions, also stable.  There is chronic cardiomegaly with a transvenous defibrillator in place.  Visualized portion of the upper abdomen demonstrates no significant abnormality.
IMPRESSION: No acute abnormality.  Stable chronic changes.

## 2007-01-16 ENCOUNTER — Ambulatory Visit: Payer: Self-pay

## 2007-01-18 ENCOUNTER — Emergency Department (HOSPITAL_COMMUNITY): Admission: EM | Admit: 2007-01-18 | Discharge: 2007-01-18 | Payer: Self-pay | Admitting: Emergency Medicine

## 2007-01-20 IMAGING — CT CT ANGIO CHEST
2 of 5 series · 19 of 36 positions shown · IV contrast (omnipaque)
Comparison: none

CLINICAL DATA: Chest pain, dizziness, and shortness of breath since yesterday.  Reason for Exam:  Rule out PE.
CT ANGIOGRAPHY OF CHEST:
TECHNIQUE: Multidetector CT imaging of the chest was performed during bolus injection of intravenous contrast.  Multiplanar CT angiographic image reconstructions were generated to evaluate the vascular anatomy. 
Contrast:  120 cc Omnipaque 300 IV.

[Series 3: pe · axial · 0.74mm/px · z∈[-291,-28]mm · 16 of 238 slices shown]
[im 14/238  lung]
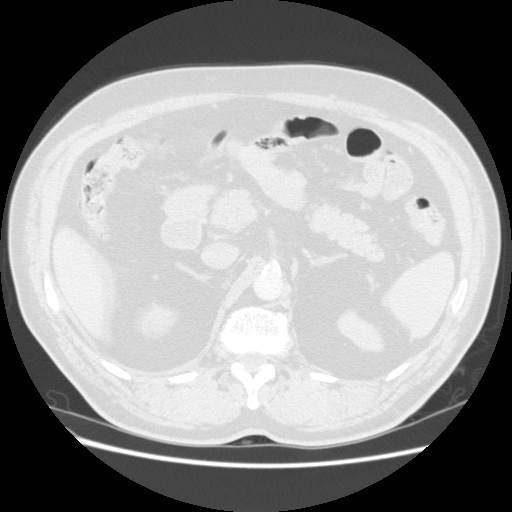
[im 28/238  mediastinal]
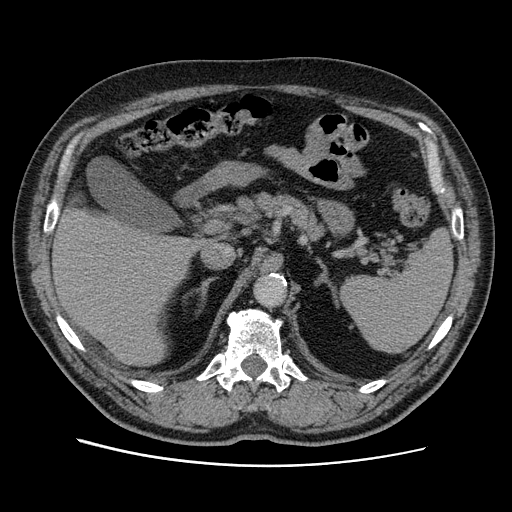
[im 42/238  lung]
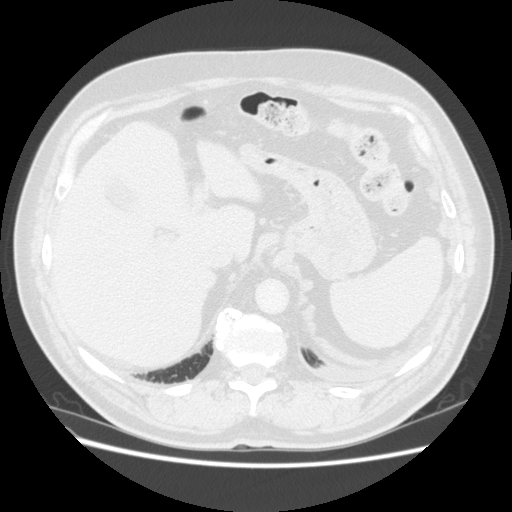
[im 56/238  mediastinal]
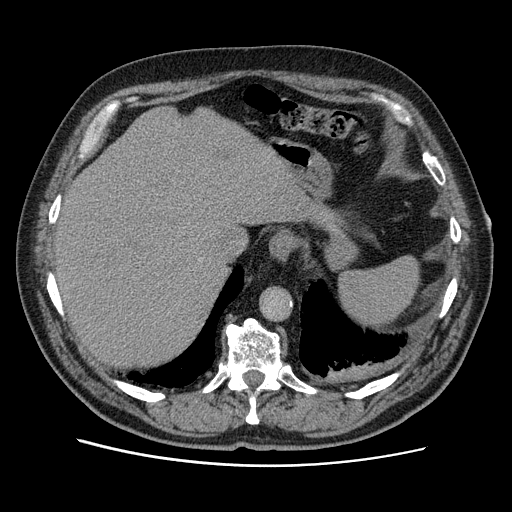
[im 70/238  lung]
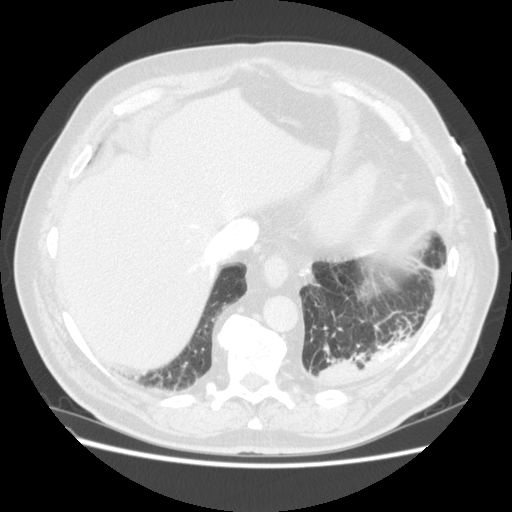
[im 84/238  mediastinal]
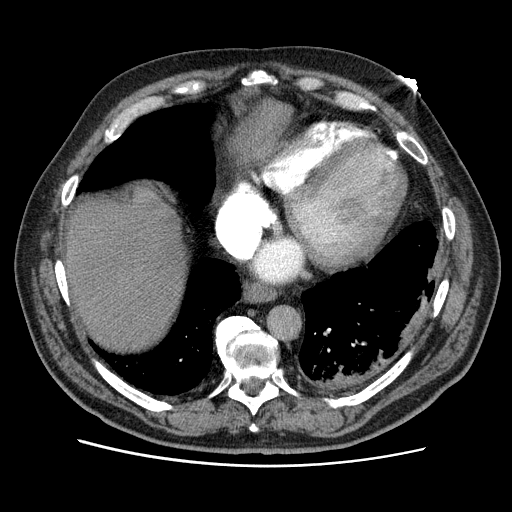
[im 98/238  lung]
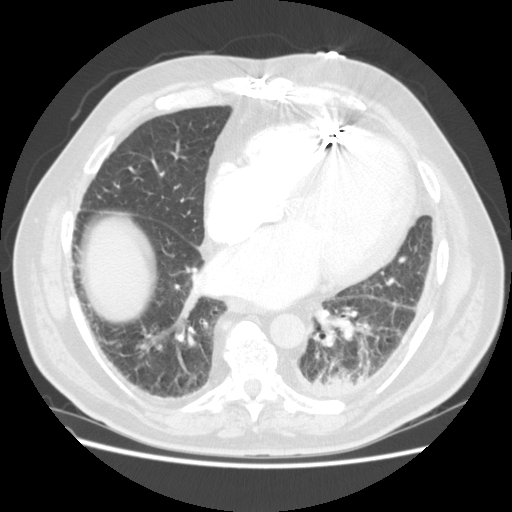
[im 112/238  mediastinal]
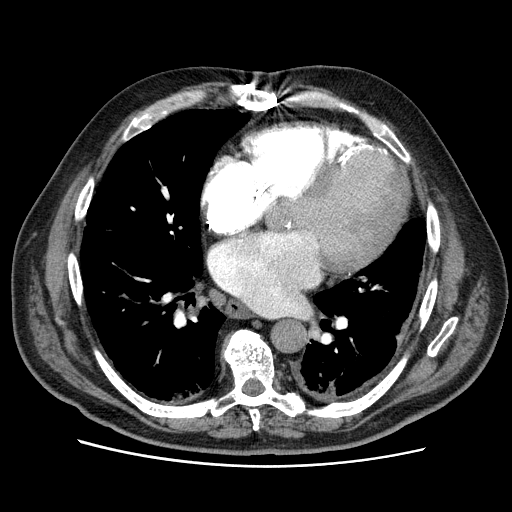
[im 126/238  lung]
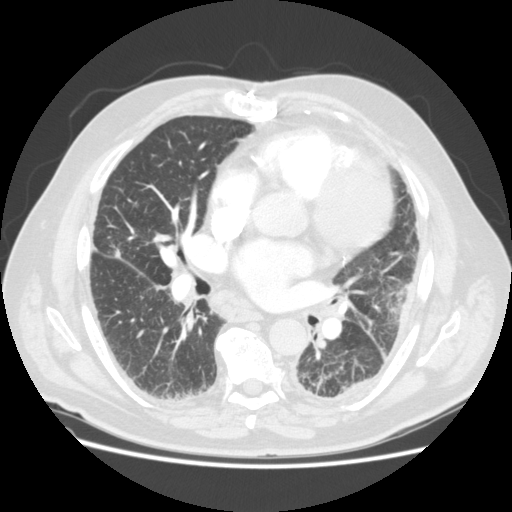
[im 140/238  mediastinal]
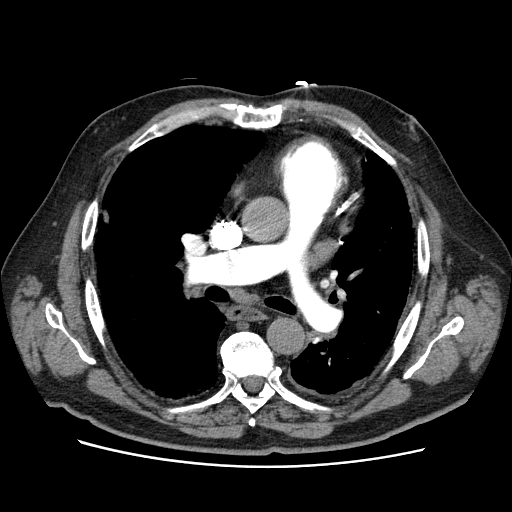
[im 154/238  lung]
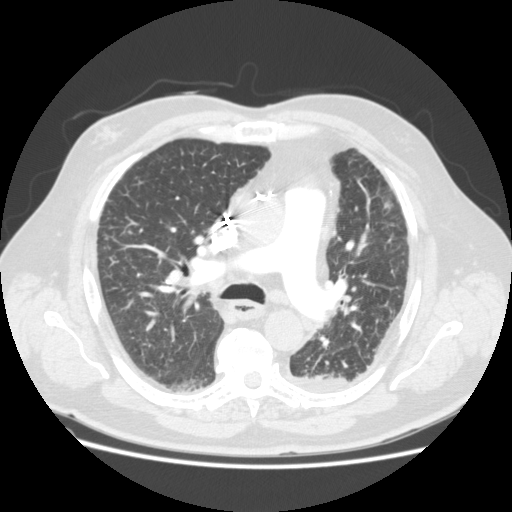
[im 168/238  mediastinal]
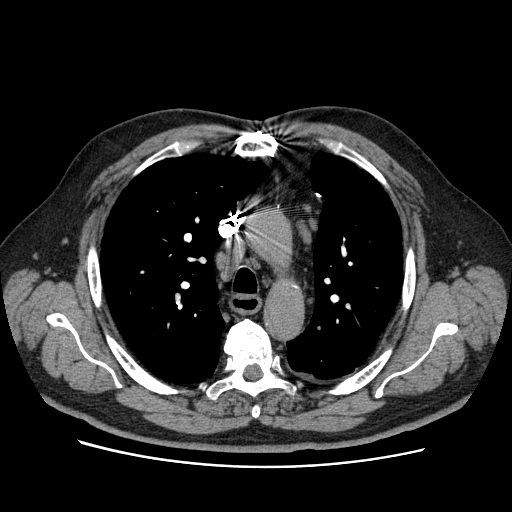
[im 182/238  lung]
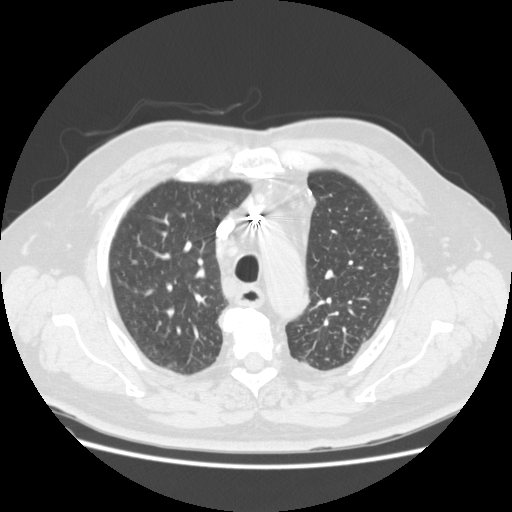
[im 196/238  mediastinal]
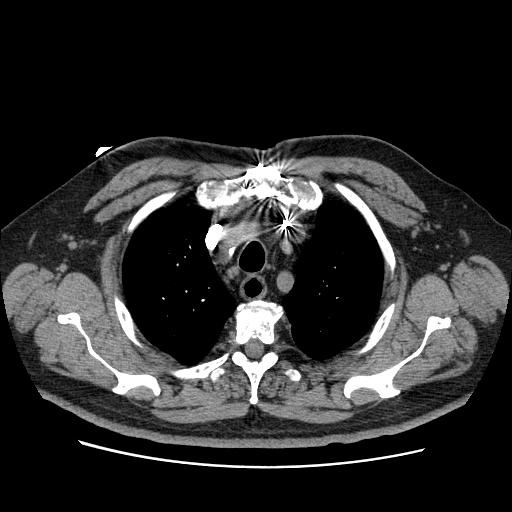
[im 210/238  lung]
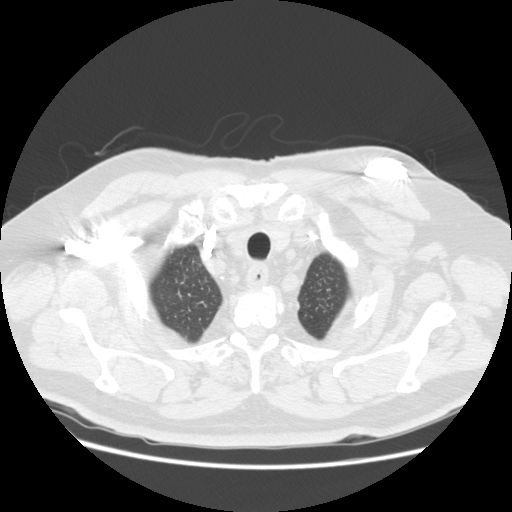
[im 224/238  mediastinal]
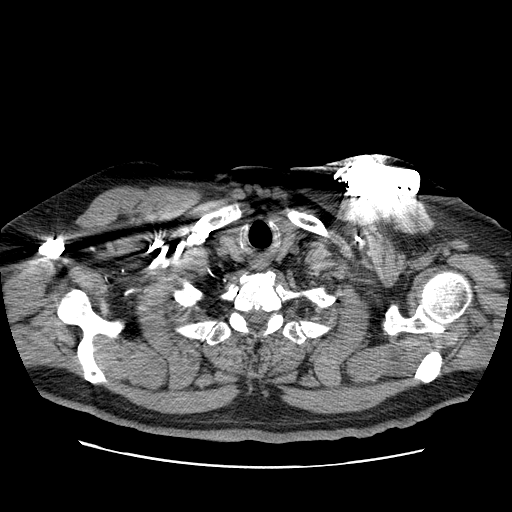

[Series 301: reformatted · coronal · 0.74mm/px · 3 of 143 slices shown]
[im 29/143  mediastinal]
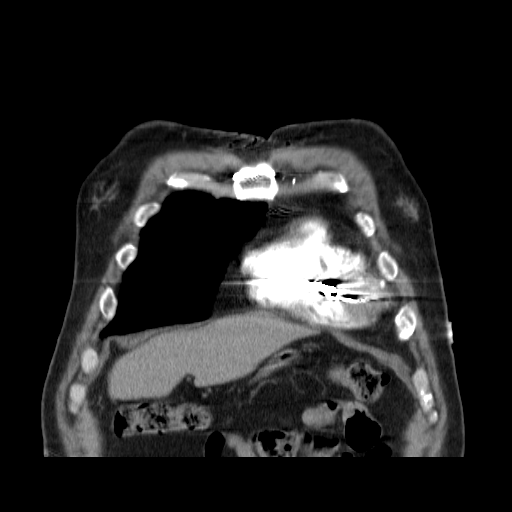
[im 57/143  mediastinal]
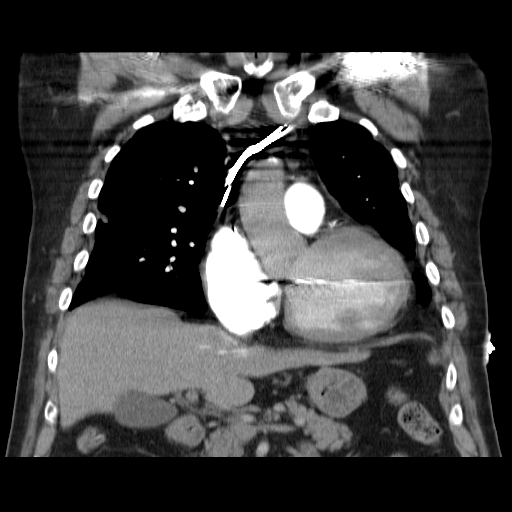
[im 86/143  mediastinal]
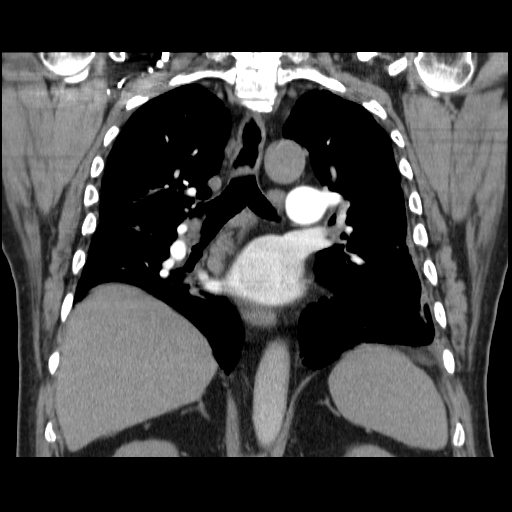

[19 of 36 positions shown; findings below may reference images not displayed]

FINDINGS: Sternal wire sutures and calcified native coronary arteries.  urvilinear calcification in the myocardium of the left cardiac apex and anteroseptal segment compatible with dystrophic calcification within a remote infarction.  No acute aortic abnormality.  CABG.  Mediastinal adenopathy as was also appreciated on 05/22/05.  Prominent sized lymph nodes are noted in the prevascular, right paratracheal, pretracheal, and subcarinal mediastinal compartments.  Minimal prominent bihilar nodes.  There is irregular pleural thickening particularly in the left mid to lower chest with pleural calcifications.  The major differential diagnostic considerations are dystrophic calcifications secondary to prior hemothorax, empyema, or previous asbestos exposure.  Chronic interstitial/peribronchial thickening changes.  indings compatible with parenchymal scarring in the lateral aspect of the right midchest, appearing unchanged since 05/22/05.
IMPRESSION: Negative for PE.
Remote LV apical MI.  
Coronary artery disease.
Chronic pleuroparenchymal changes.
Mediastinal and mild bihilar adenopathy, etiology indeterminate.

## 2007-01-20 IMAGING — CR DG CHEST 1V PORT
1 series · 1 of 1 positions shown · non-contrast
Comparison: Chest of 05/19/05.

CLINICAL DATA: Chest pain.  Dizziness.  
 PORTABLE CHEST - 1 VIEW 06/21/05 AT 0026 HOURS:

[view not recorded]
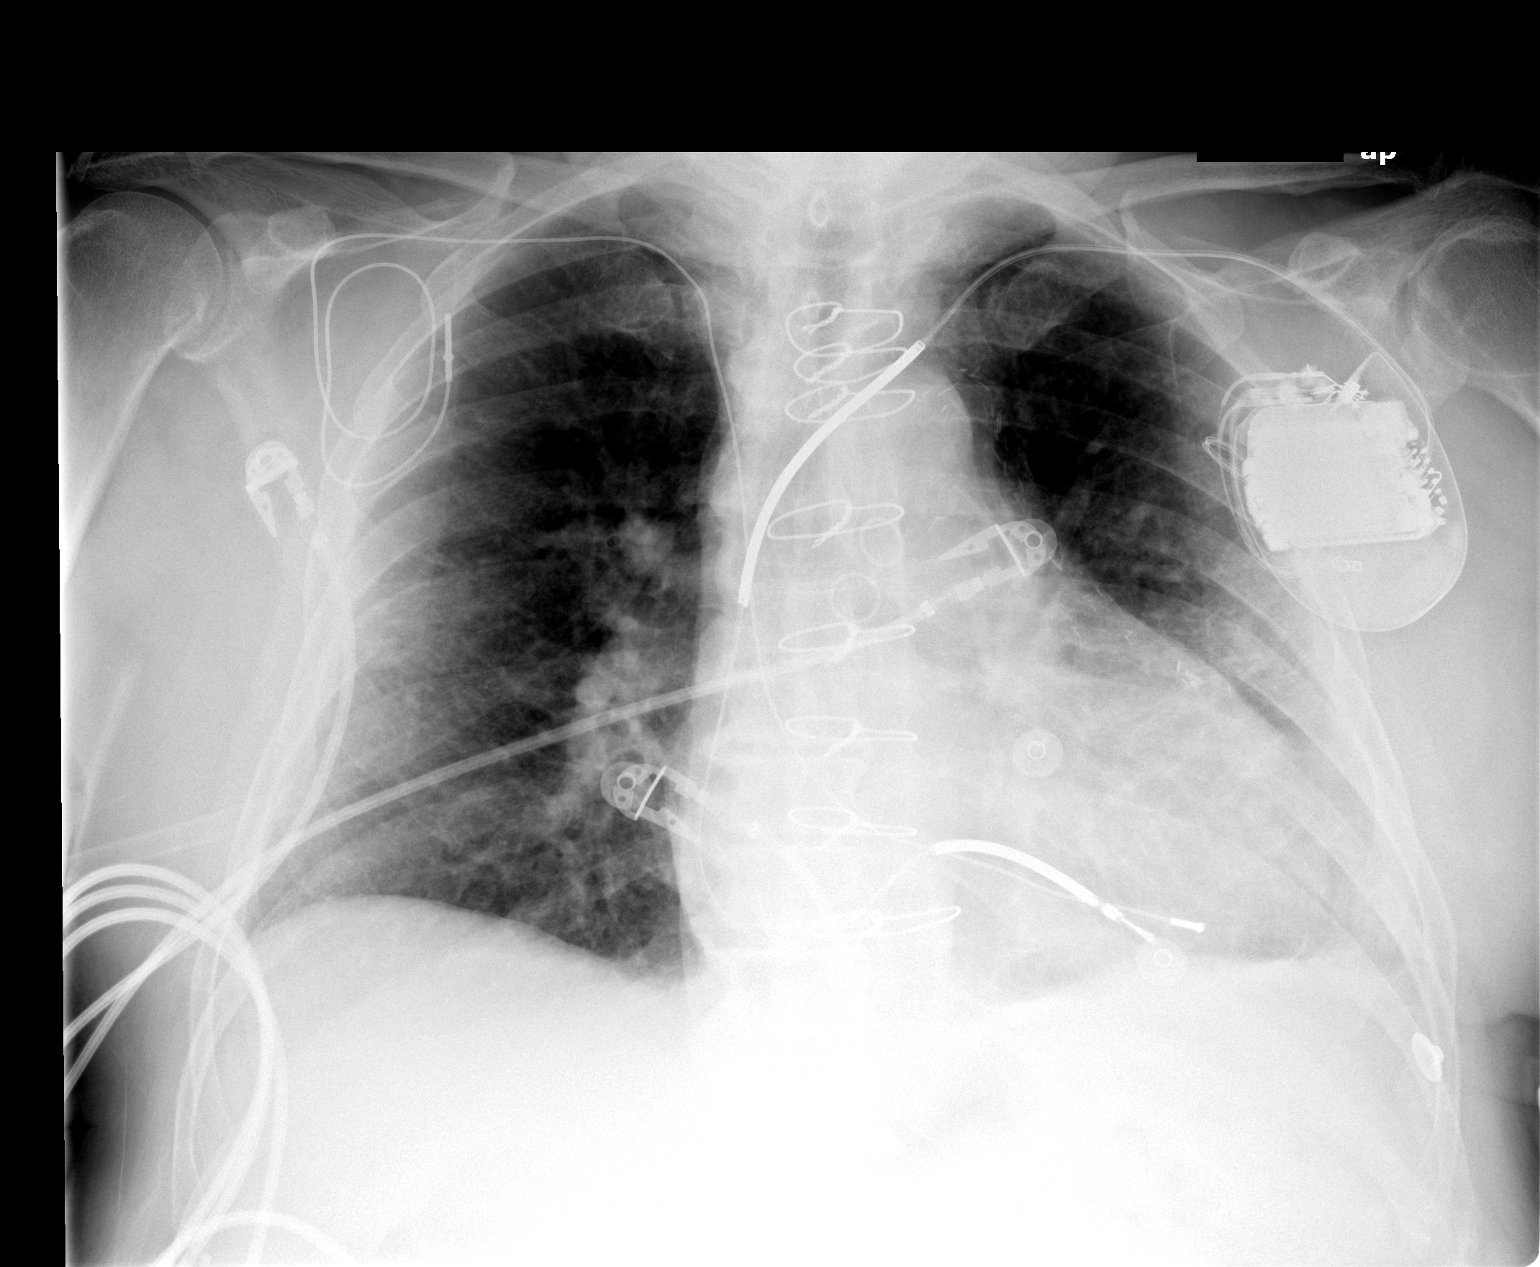

[1 of 1 positions shown; findings below may reference images not displayed]

FINDINGS: Cardiomegaly is stable.  Pacer with AICD lead remains.  Disconnected lead from the right is unchanged.  No active infiltrate or effusion is seen.
IMPRESSION: Stable chest x-ray with cardiomegaly.  Pacer and chronic change.  No active lung disease.

## 2007-01-27 ENCOUNTER — Inpatient Hospital Stay (HOSPITAL_COMMUNITY): Admission: EM | Admit: 2007-01-27 | Discharge: 2007-02-01 | Payer: Self-pay | Admitting: Emergency Medicine

## 2007-02-22 IMAGING — CR DG CHEST 2V
2 series · 2 of 2 positions shown · non-contrast
Comparison: Chest x-ray 06/21/05.

CLINICAL DATA: Chest pain, shortness of breath. 
 CHEST ? 2 VIEW:

[w chest pa]
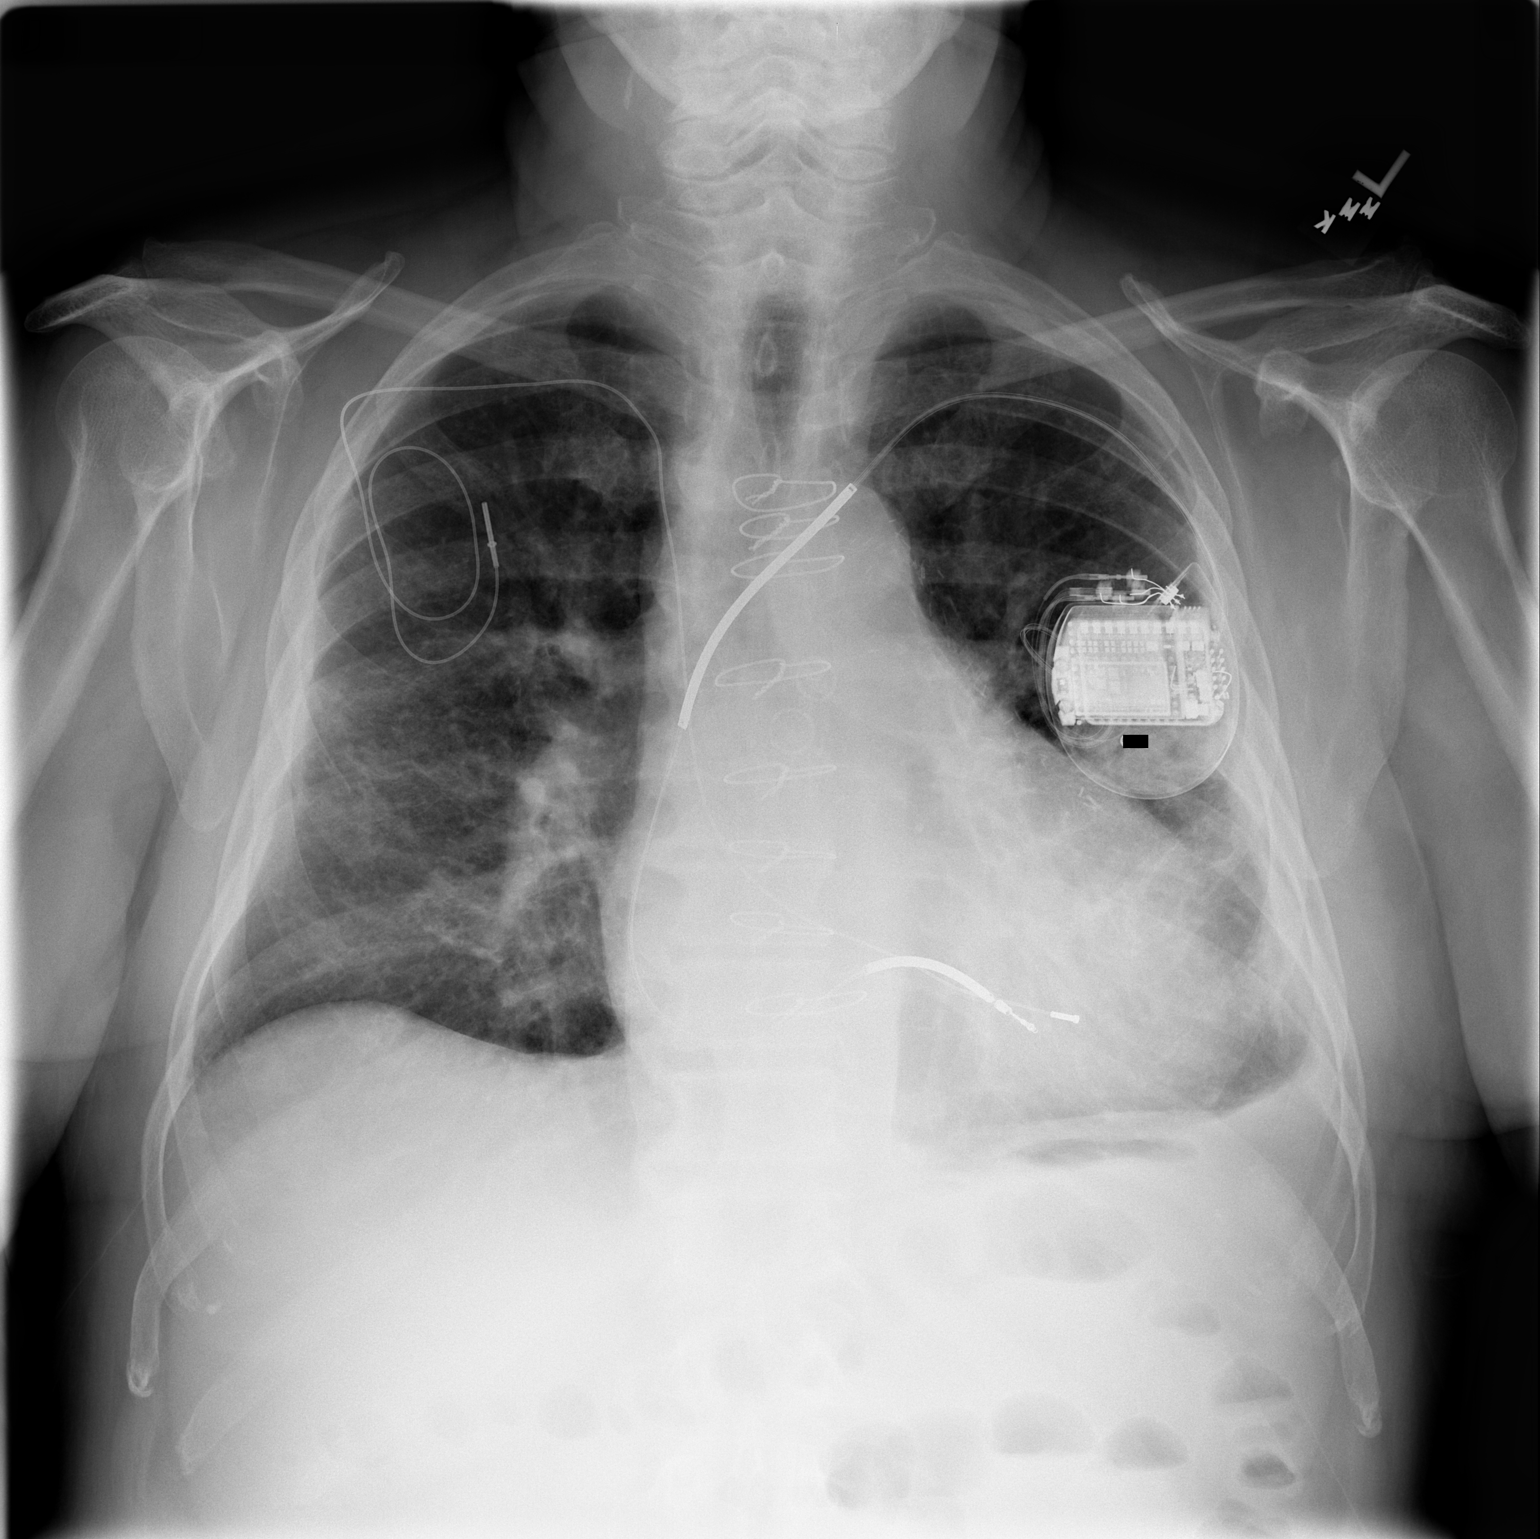

[w chest lat]
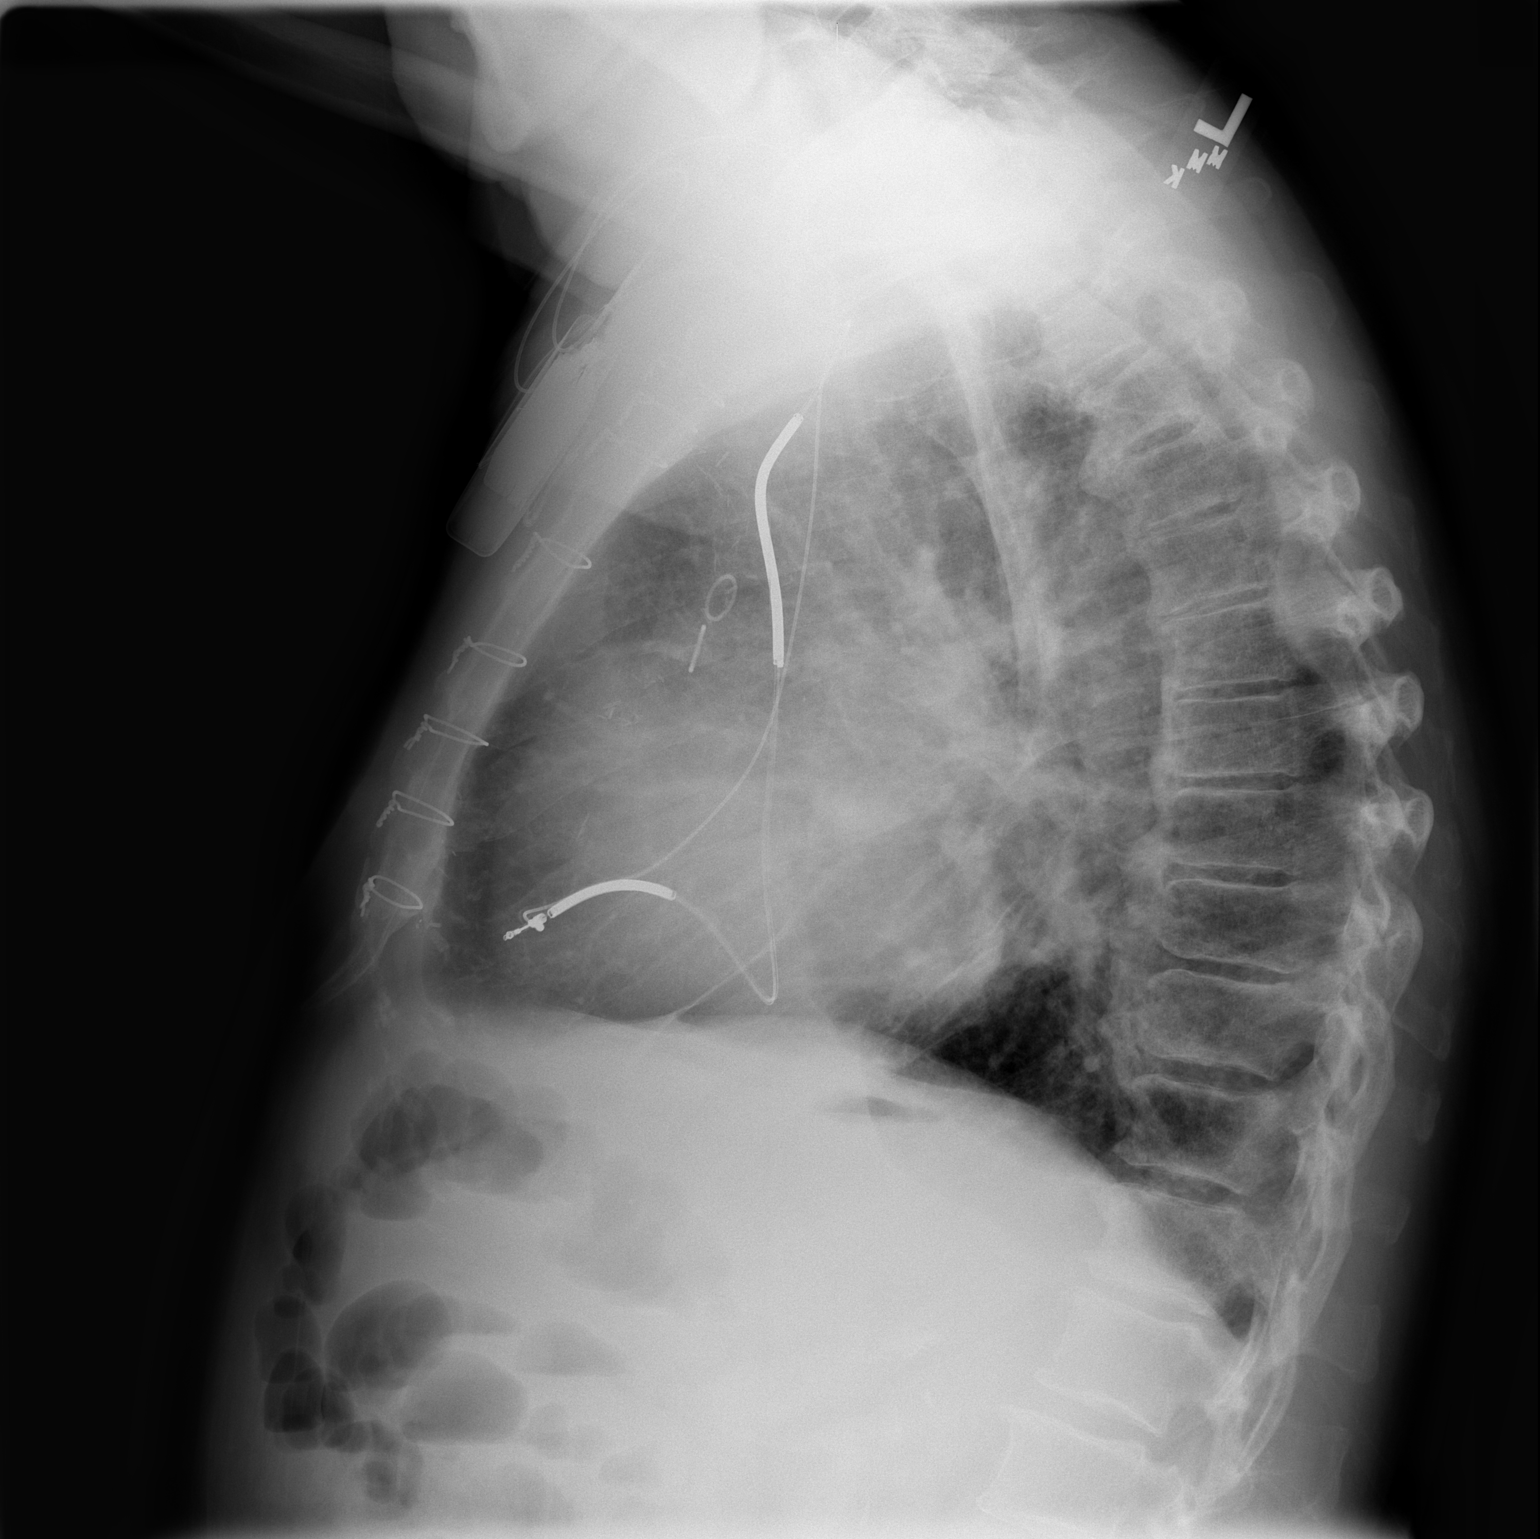

[2 of 2 positions shown; findings below may reference images not displayed]

FINDINGS: Old pacemaker lead enters from the right with AICD entering from the left with leads having a similar position to that of the prior exam.  Cardiomegaly and pulmonary vascular prominence superimposed upon chronic lung changes.  Left base pleural thickening is stable.  Please see chest CT report of 06/21/05.
IMPRESSION: Cardiomegaly, slight pulmonary vascular prominence superimposed upon chronic lung changes similar to that of prior exam.

## 2007-02-22 IMAGING — CT CT ANGIO CHEST
2 of 3 series · 19 of 46 positions shown · IV contrast (APPLIED)
Comparison: 06/21/05.

CLINICAL DATA: 72 year-old-male with shortness of breath. Chest pain, history of lung cancer.  Heart disease. 
CT ANGIOGRAPHY OF CHEST ? 07/24/05:
TECHNIQUE: Multidetector CT imaging of the chest was performed during bolus injection of intravenous contrast.  Multiplanar CT angiographic image reconstructions were generated to evaluate the vascular anatomy.
Contrast:  80 ml Omnipaque 300.

[Series 4: pulm embolism 2.0 st · axial · 0.66mm/px · z∈[-361,-51]mm · 16 of 167 slices shown]
[im 6/167  lung]
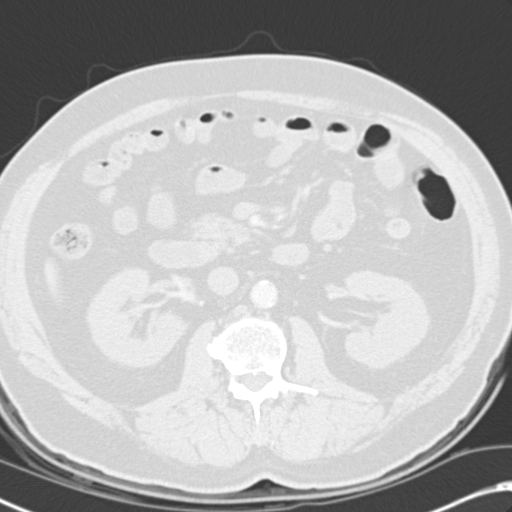
[im 17/167  soft-tissue]
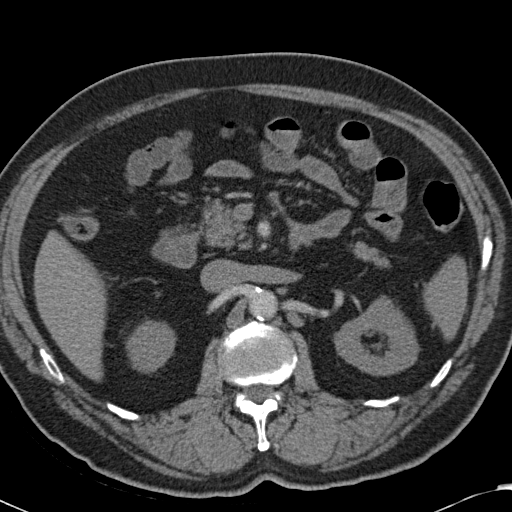
[im 27/167  lung]
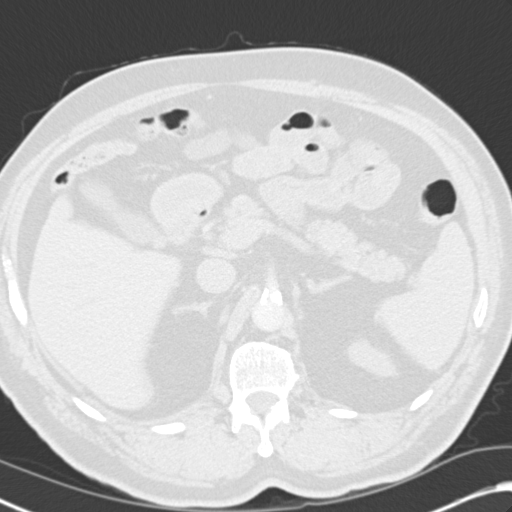
[im 38/167  soft-tissue]
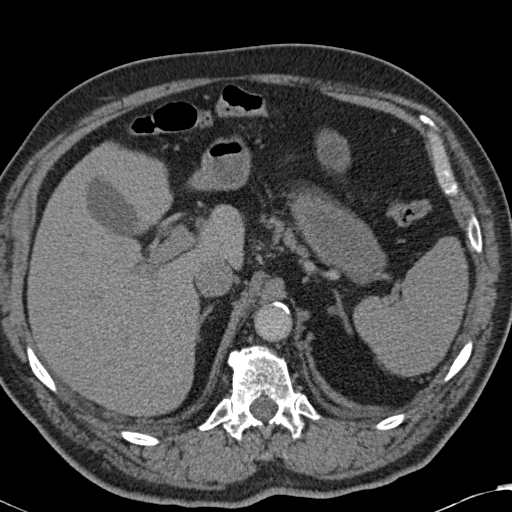
[im 49/167  lung]
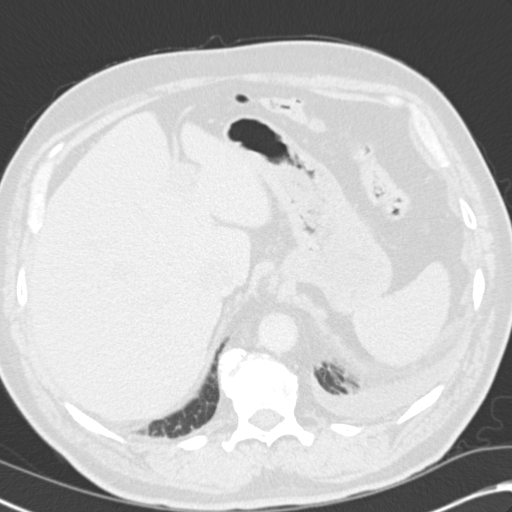
[im 59/167  soft-tissue]
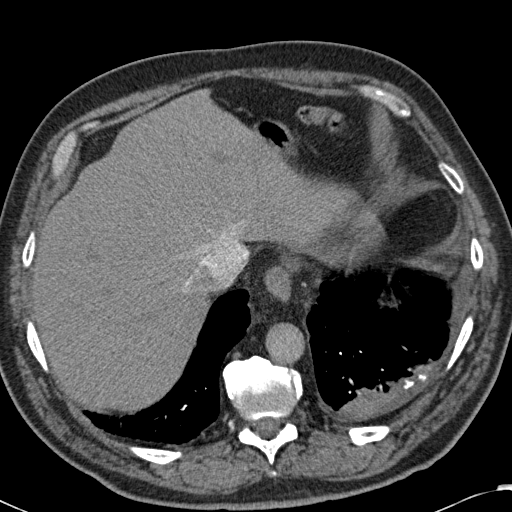
[im 70/167  lung]
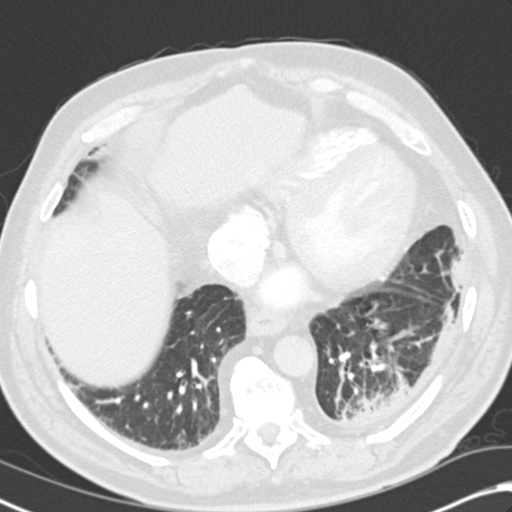
[im 81/167  soft-tissue]
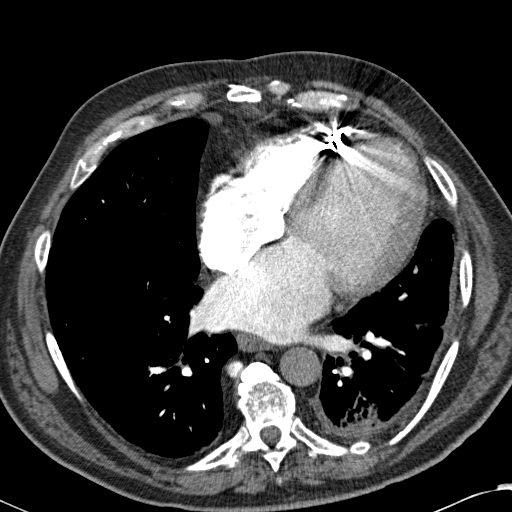
[im 86/167  lung]
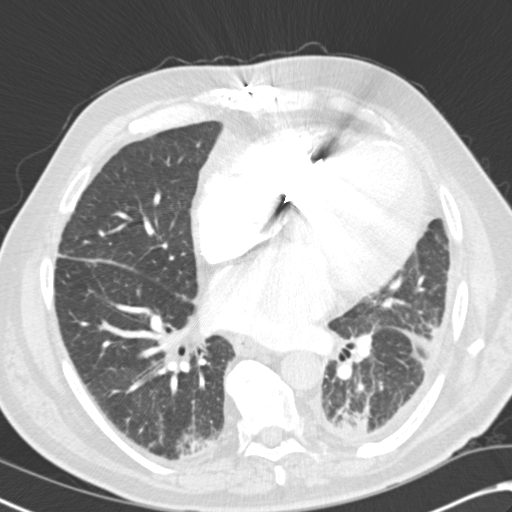
[im 97/167  soft-tissue]
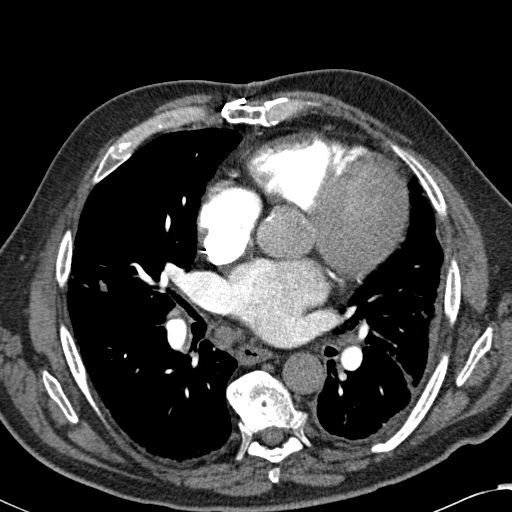
[im 108/167  lung]
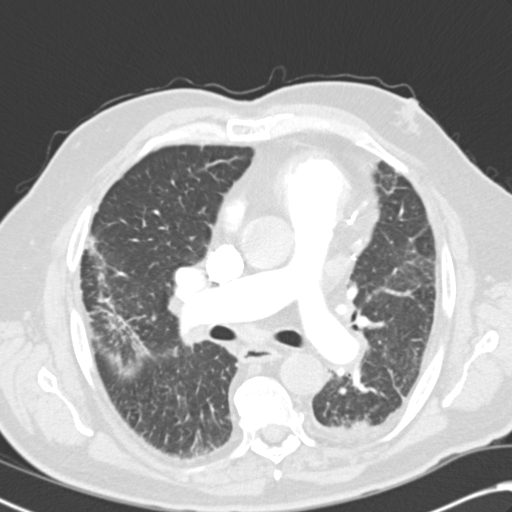
[im 118/167  soft-tissue]
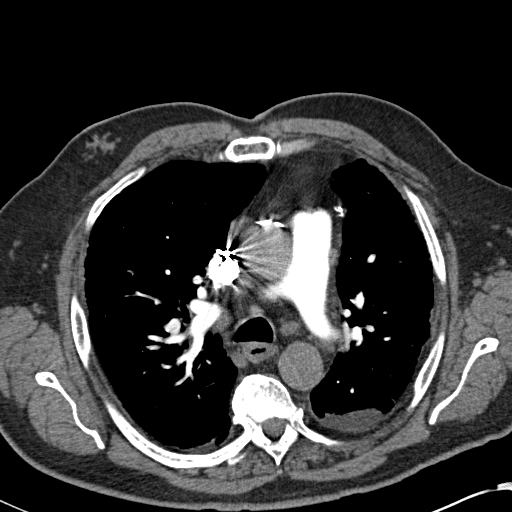
[im 129/167  lung]
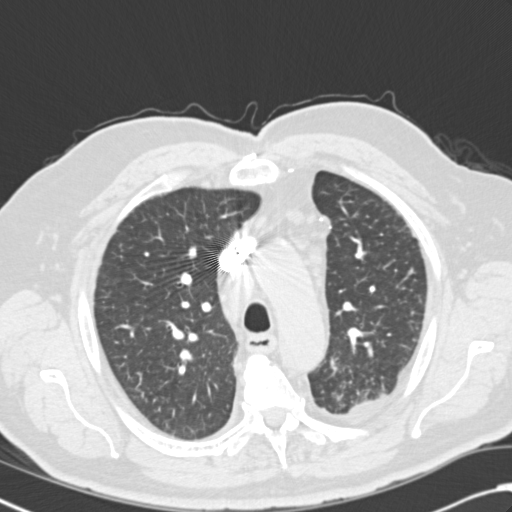
[im 140/167  soft-tissue]
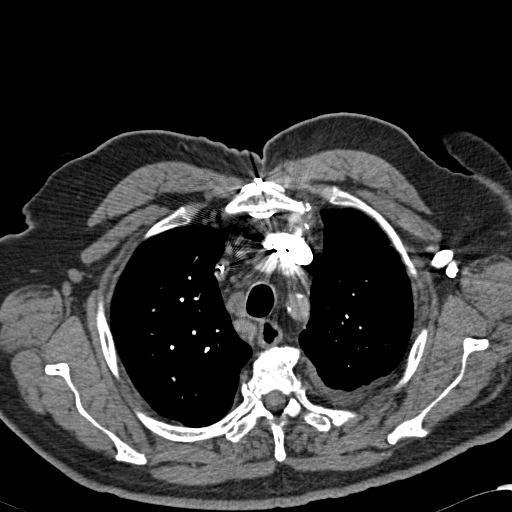
[im 150/167  lung]
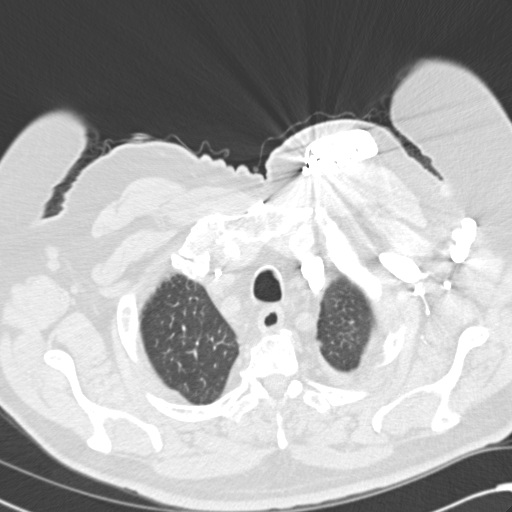
[im 161/167  soft-tissue]
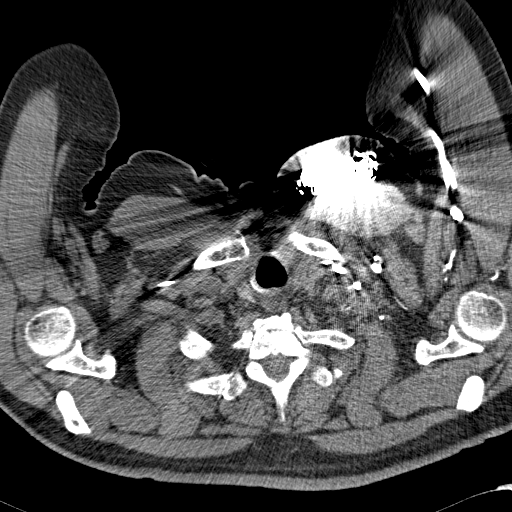

[Series 7: coronals · coronal · 0.65mm/px · 3 of 154 slices shown]
[im 52/154  soft-tissue]
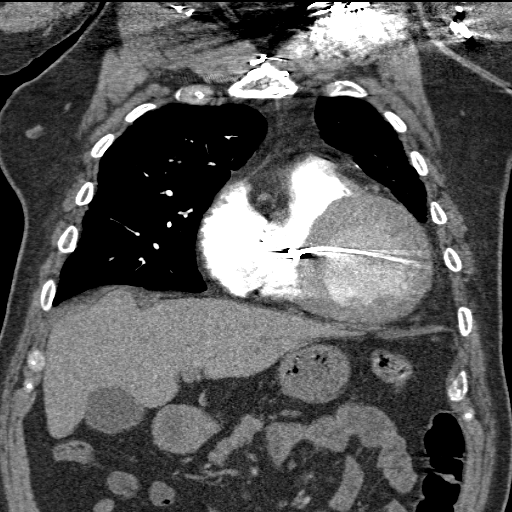
[im 69/154  soft-tissue]
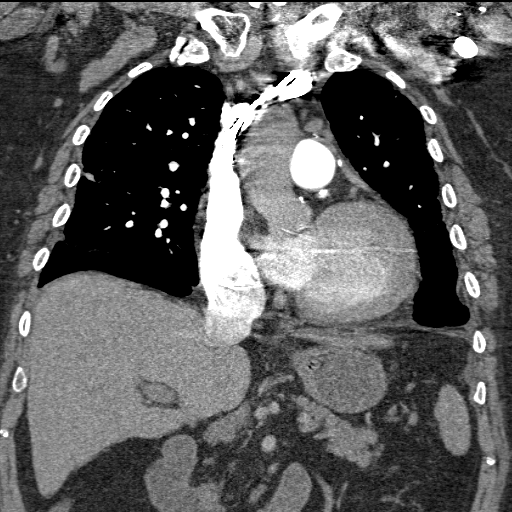
[im 86/154  soft-tissue]
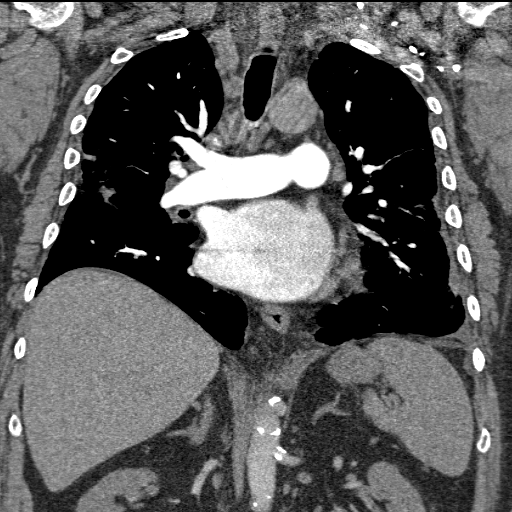

[19 of 46 positions shown; findings below may reference images not displayed]

FINDINGS: There are no focal filling defects to suggest pulmonary embolus.  Cardiomegaly is re-demonstrated with calcification of prior apical infarct.      Atherosclerotic calcifications are noted within the coronary arteries.  There is some interval growth of right paratracheal lymph nodes.  A tracheal esophageal node measures 12 mm.  Right peritracheal nodes are up to 12 mm as well.  The largest prevascular node is 11 mm.  Additional azygoesophageal nodes are present.   Incidental imaging in the upper abdomen is unremarkable.  Specifically, there is no evidence for mass lesion within the adrenal glands.  Atherosclerotic changes of the aorta.   Note is again made of bilateral gynecomastia.  A pacer defibrillator device is in place. 
Lung windows show no significant change in irregular pleural thickening on the left with associated calcifications.  Scarring in the right middle lobe is re-demonstrated.    There is associated air space disease, left greater than right lower lobe. 
Bone windows:  The patient is status-post median sternotomy for CABG.  Degenerative changes are noted without focal, lytic or blastic lesion.  Remote posterior left rib fracture is redemonstrated.
IMPRESSION: 1.  No PE. 
2.  Stable cardiomegaly without failure.
3.  Remote infarct left ventricular apex.  
4.  Stable diffuse pleural thickening on the left with calcifications.  As previously stated, this may be sequelae of prior infection or hemothorax.  Alternatively, asbestos exposure. 
5.   Slight increase in multiple mediastinal lymph nodes.  This raises concern for metastatic disease.

## 2007-02-25 IMAGING — CT CT ABDOMEN W/ CM
2 of 5 series · 16 of 46 positions shown, 18 images · IV contrast (APPLIED)
Comparison: CT chest 07/24/05 and 06/21/05, CT of the abdomen and pelvis 10/03/04 also reviewed. 
Contrast:  100 cc Omnipaque 300.

CLINICAL DATA: Left upper quadrant pain.  History of lung cancer.  Recent CABG.  History of diabetes. 
ABDOMEN CT WITH CONTRAST:
TECHNIQUE: Multidetector CT imaging of the abdomen was performed following the standard protocol during bolus administration of intravenous contrast.
TECHNIQUE: Multidetector CT imaging of the pelvis was performed following the standard protocol during bolus administration of intravenous contrast.

[Series 2: abd/pelv with 5.0 b31f st · axial · 0.83mm/px · z∈[-464,-79]mm · 13 of 87 slices shown, 15 images]
[im 5/87  soft-tissue]
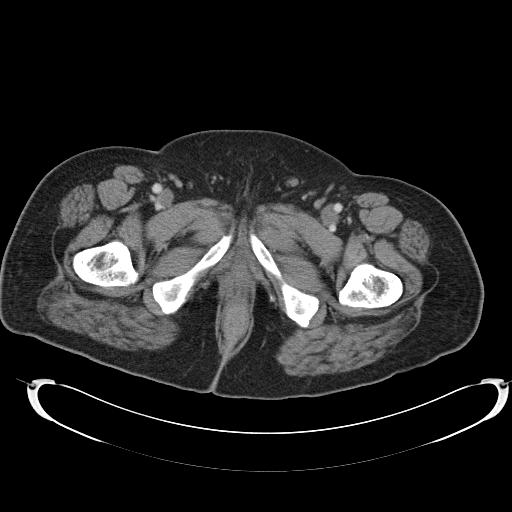
[im 5/87  bone]
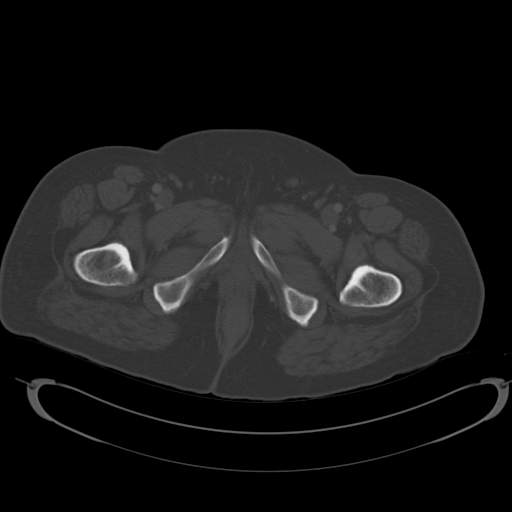
[im 13/87  soft-tissue]
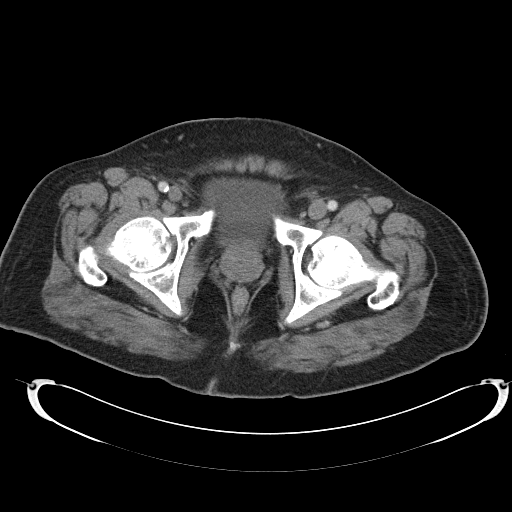
[im 18/87  soft-tissue]
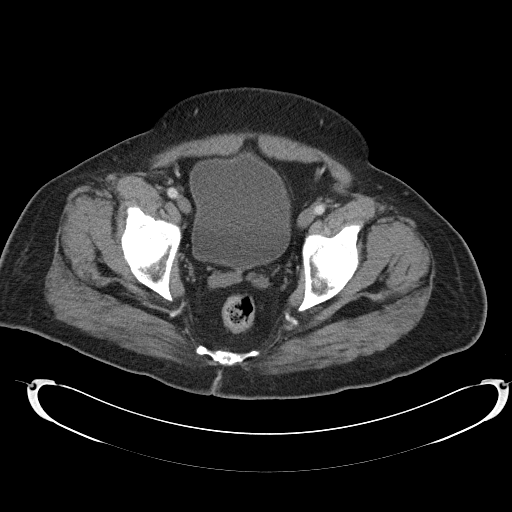
[im 26/87  soft-tissue]
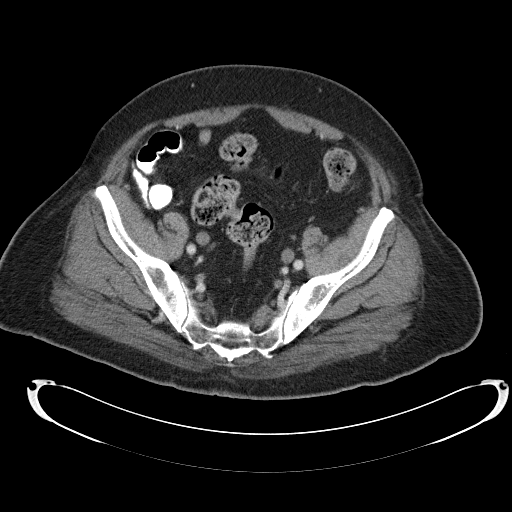
[im 31/87  soft-tissue]
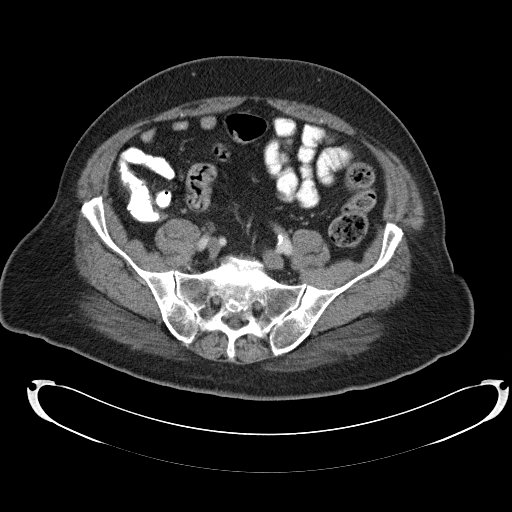
[im 39/87  soft-tissue]
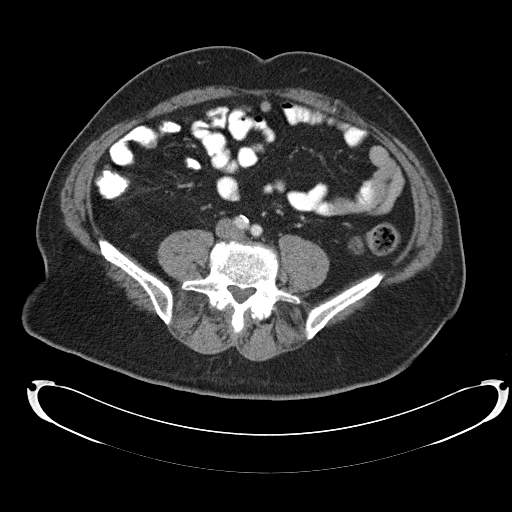
[im 44/87  soft-tissue]
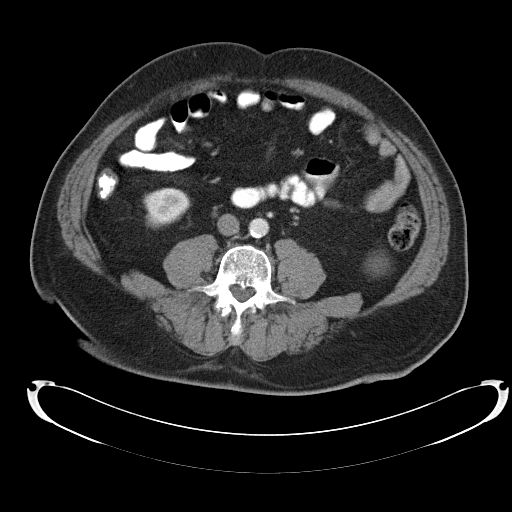
[im 48/87  soft-tissue]
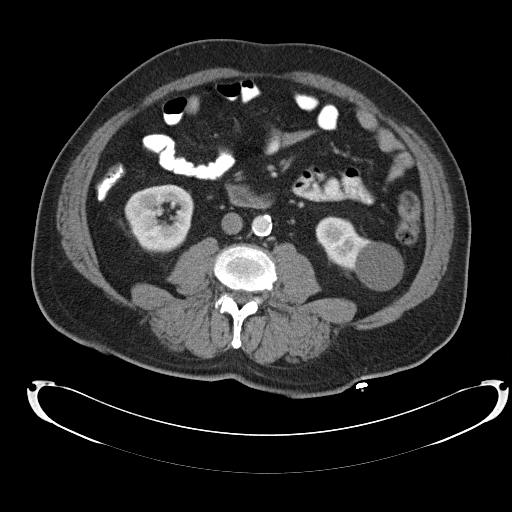
[im 56/87  soft-tissue]
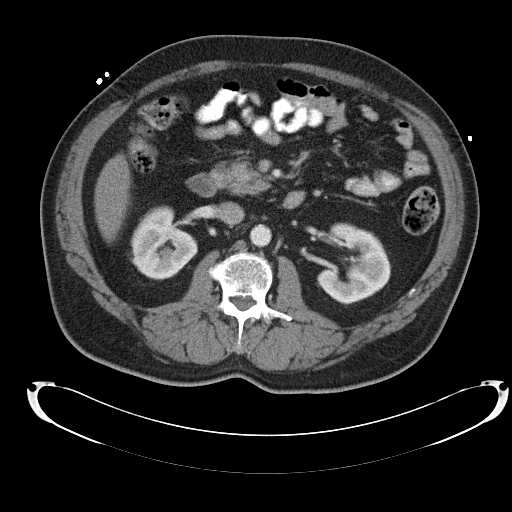
[im 56/87  bone]
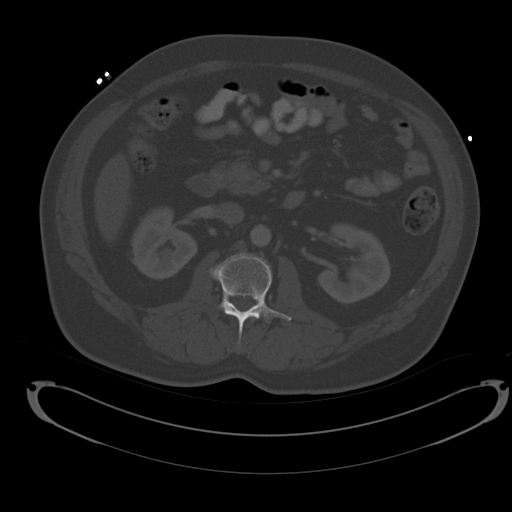
[im 61/87  soft-tissue]
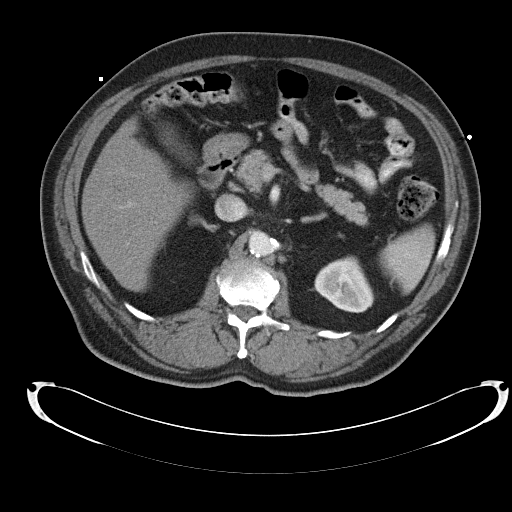
[im 69/87  soft-tissue]
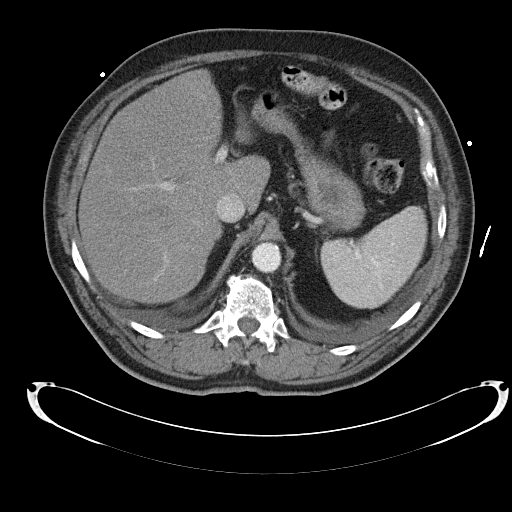
[im 74/87  soft-tissue]
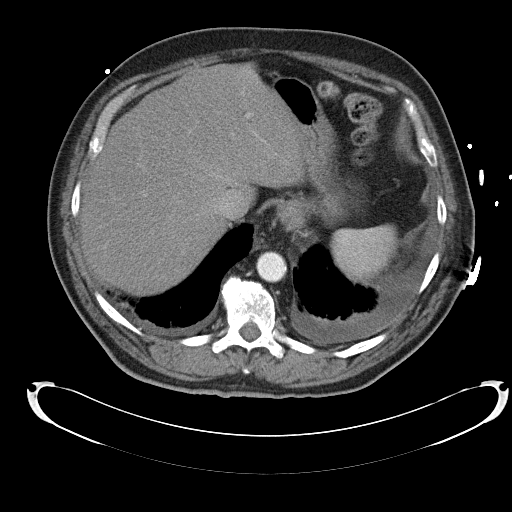
[im 82/87  soft-tissue]
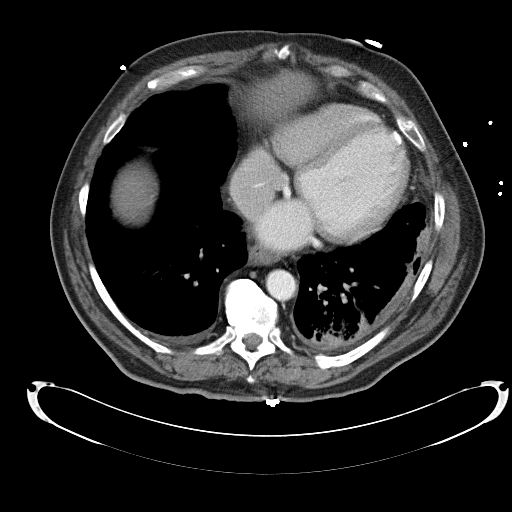

[Series 604: coronal abd · coronal · 0.85mm/px · 3 of 136 slices shown]
[im 46/136  soft-tissue]
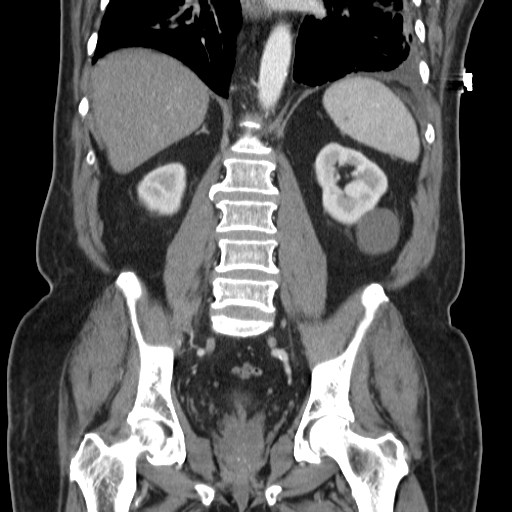
[im 61/136  soft-tissue]
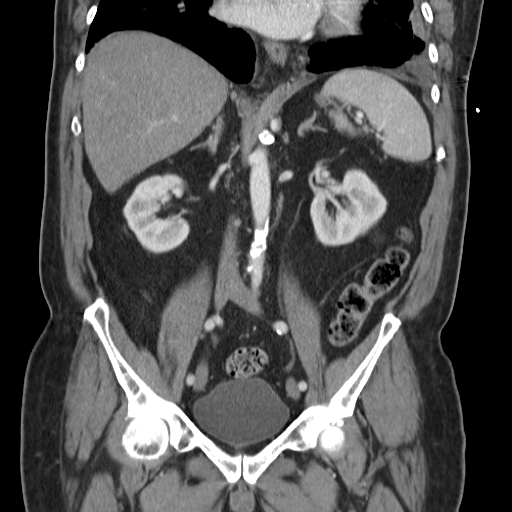
[im 76/136  soft-tissue]
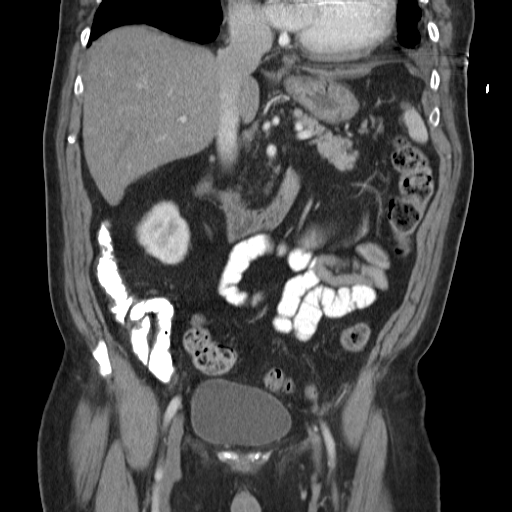

[16 of 46 positions shown; findings below may reference images not displayed]

FINDINGS: Mild respiratory motion involves the lung bases.  Mild left base airspace disease slightly improved since the prior exam and likely relates to atelectasis.  There is a pacer with AICD device in the right ventricle. The heart is moderately enlarged with remote ventricular apex infarct.  There is no pericardial effusion.  Small left-sided pleural effusion is similar to 07/24/05.   There is trace right-sided pleural fluid, slightly increased. There is increased density in the left pleural space which could relate to prior talc pleurodesis or prior pleural based infection. 
The liver is normal. There are small low density lesions in the spleen which were likely represent previously and are too small to characterize.  There is low paraesophageal adenopathy with the node measuring 1.1 cm on image 11.  The stomach and pancreas are normal.   There is mild respiratory motion in the region of the pancreatic head. Possible gallstone or sludge but no acute cholecystitis.  Bilateral adrenal gland normal.  Right lower pole renal calculus is not obstructive. There is a left lower pole renal cyst. 
There are prominent retroperitoneal lymph nodes including an 8 mm stable left paraaortic node on image 28.  No lymphadenopathy.  
There is apparent narrowing of the transverse colon on image 23 which is likely due to peristalsis or underdistention.  The appendix is normal.  The small bowel is normal and there is no ascites.
IMPRESSION: 1.  No explanation for left upper quadrant pain. 
2.  Low paraesophageal adenopathy suspicious for metastatic disease.  
3.  Similar left-sided and slight increase in trace right-sided pleural fluid with adjacent left basilar atelectasis. 
4.  Cardiomegaly and remote left apical infarct.  
5.  Right lower pole nonobstructive renal calculus. 
6.  Possible cholelithiasis without cholecystitis. 
PELVIS CT WITH CONTRAST:
FINDINGS: An area of apparent narrowing of the sigmoid colon is also likely due to peristalsis or under distention on image 63.  Small bowel normal.    No pelvic adenopathy or ascites. The urinary bladder normal.    The prostate normal.  
Bone windows demonstrate left femoral head lucent lesion, likely degenerative change.  Postsurgical defects about inferior posterior left-sided ribs.
IMPRESSION: 1.  No acute findings in the pelvis. 
2.  Areas of apparent colonic thickening/narrowing felt to be due to peristalsis or underdistention.  Correlate with any colonic symptoms and consider a more complete evaluation with colonoscopy as indicated.

## 2007-02-28 IMAGING — US US ABDOMEN COMPLETE
1 series · 13 of 25 positions shown · non-contrast
Comparison: CT abdomen 07/27/05 and ultrasound abdomen 05/26/03.

CLINICAL DATA: Left upper quadrant pain.
ABDOMEN ULTRASOUND ? 07/30/05:
TECHNIQUE: Complete abdominal ultrasound examination was performed including evaluation of the liver, gallbladder, bile ducts, pancreas, kidneys, spleen, IVC, and abdominal aorta.

[Series 1: unknown · 0.34mm/px · 13 of 89 slices shown]
[im 1/89]
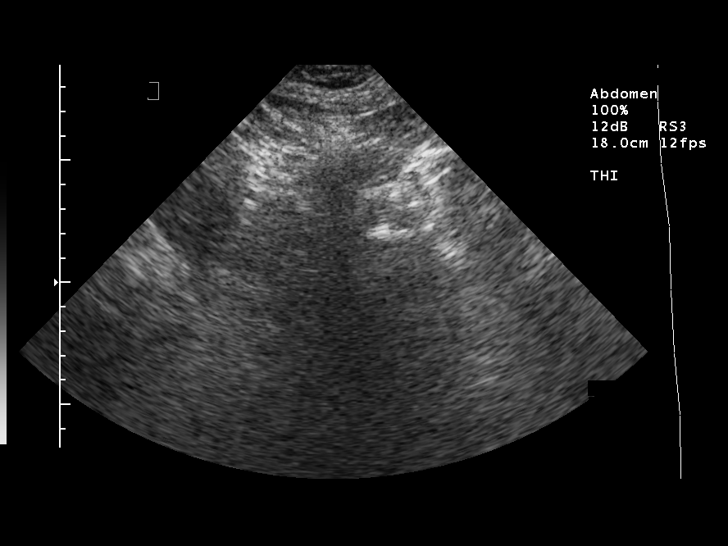
[im 8/89]
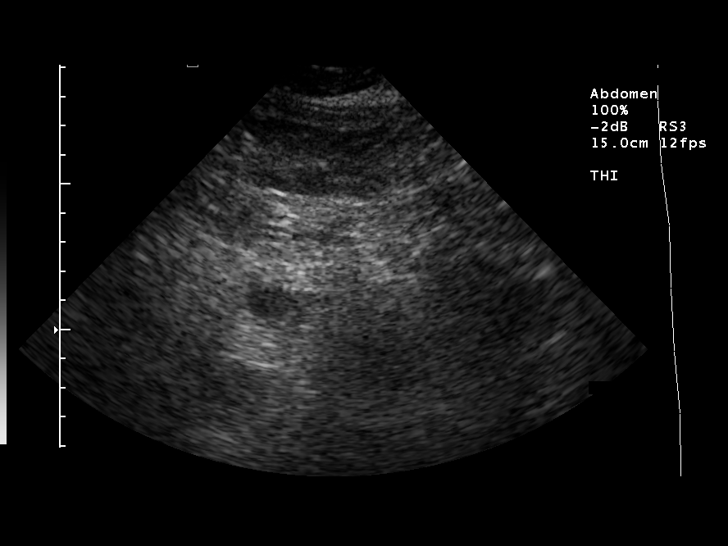
[im 15/89]
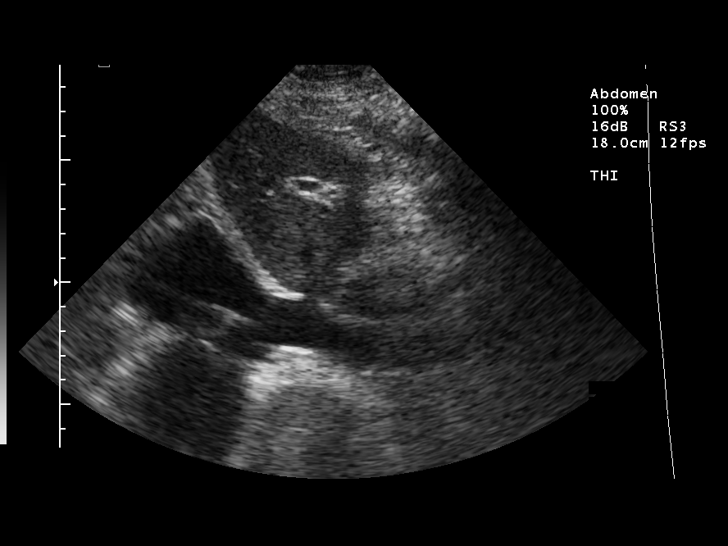
[im 23/89]
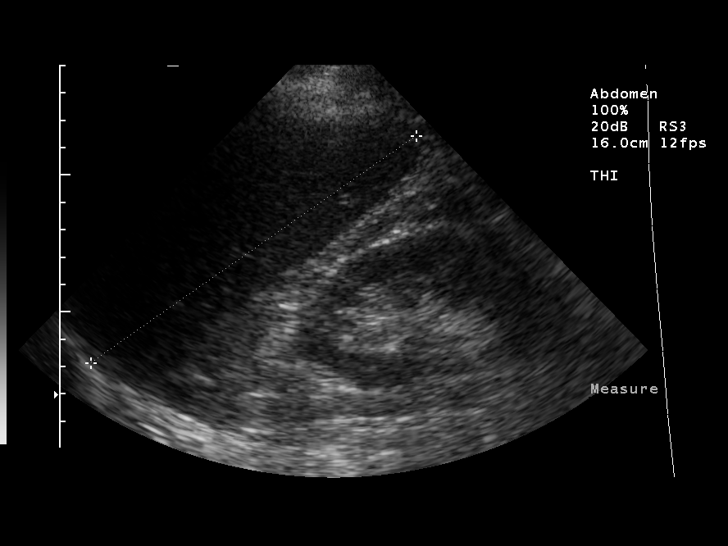
[im 30/89]
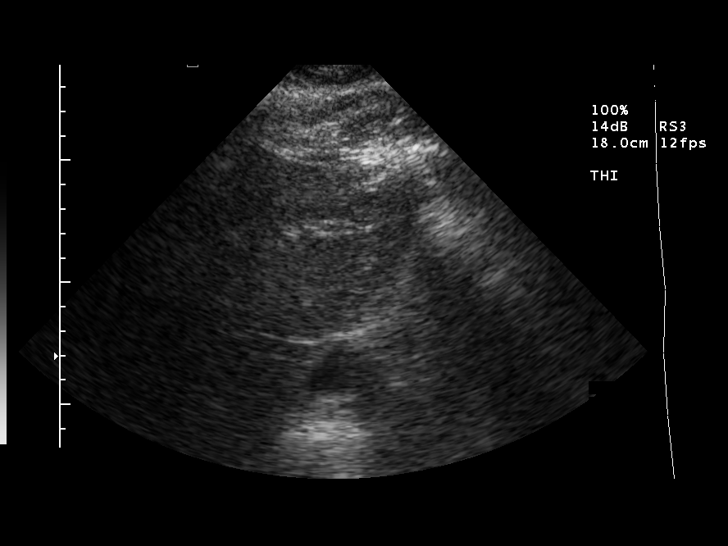
[im 37/89]
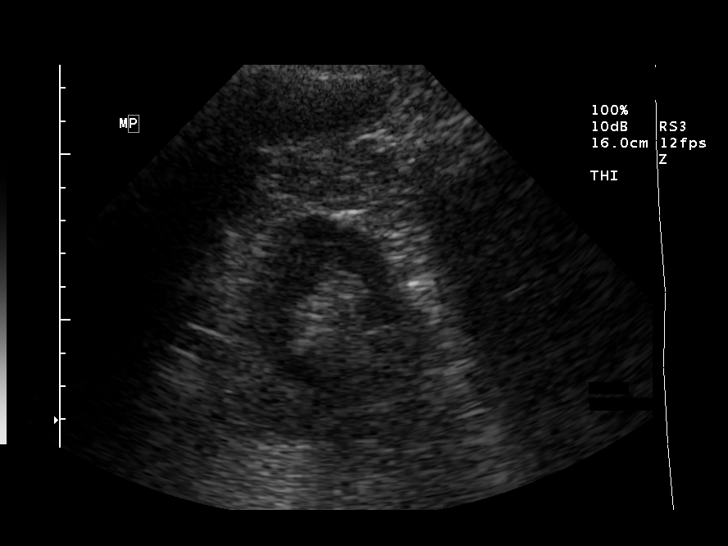
[im 45/89]
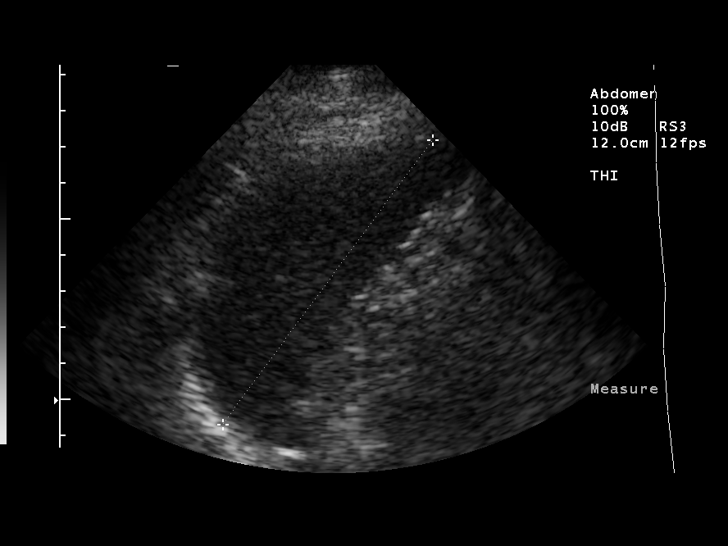
[im 52/89]
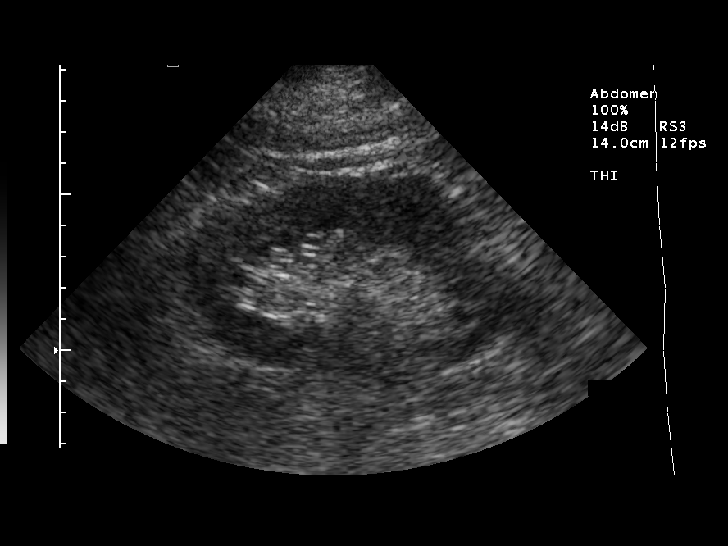
[im 59/89]
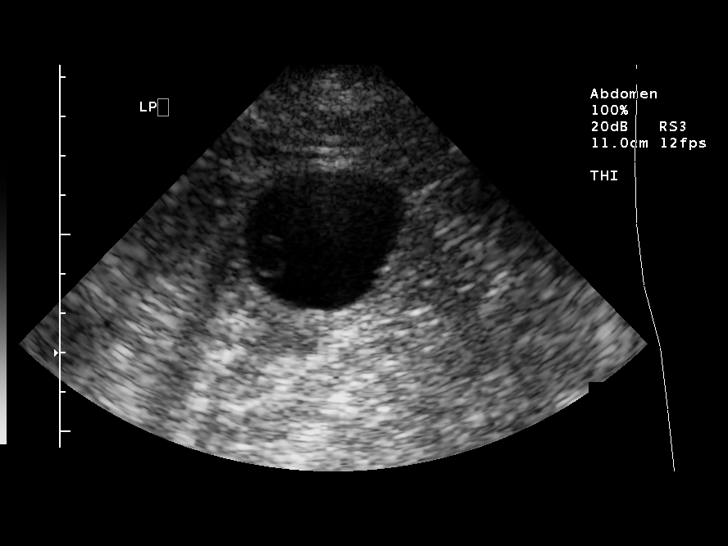
[im 67/89]
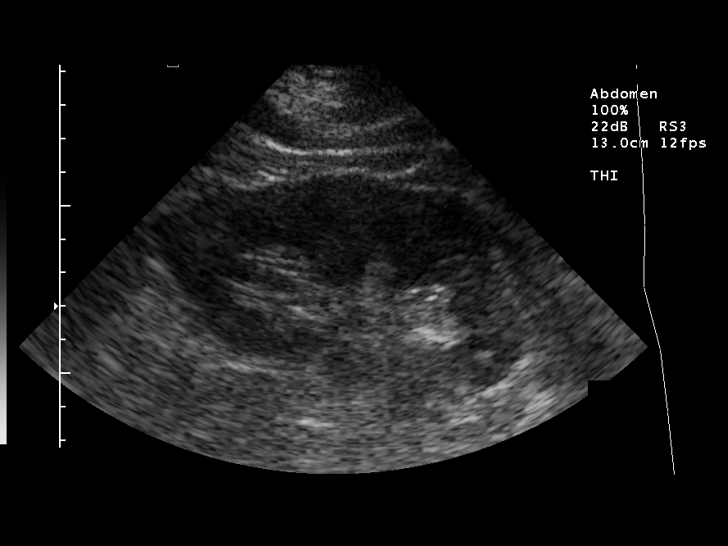
[im 74/89]
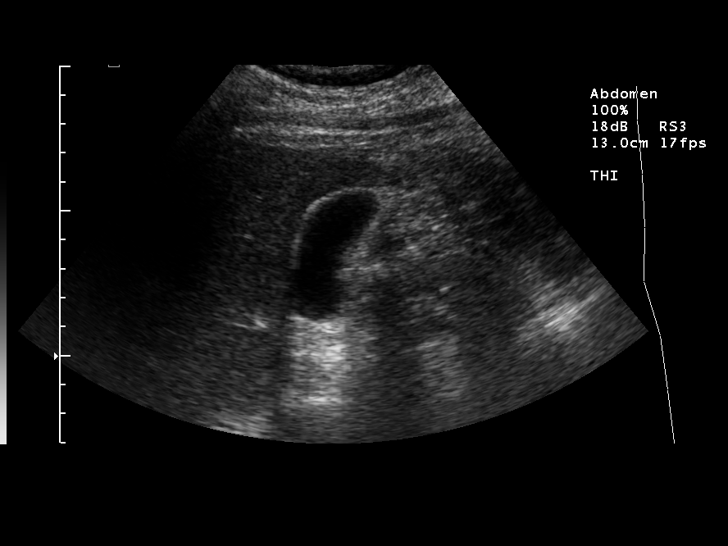
[im 81/89]
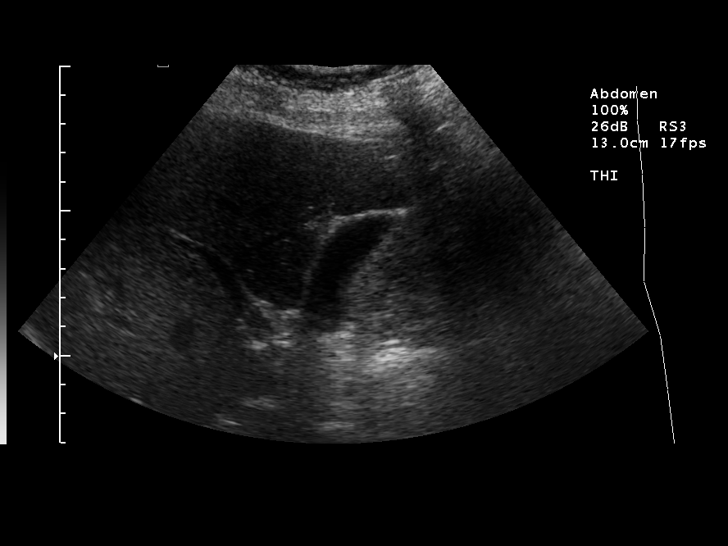
[im 89/89]
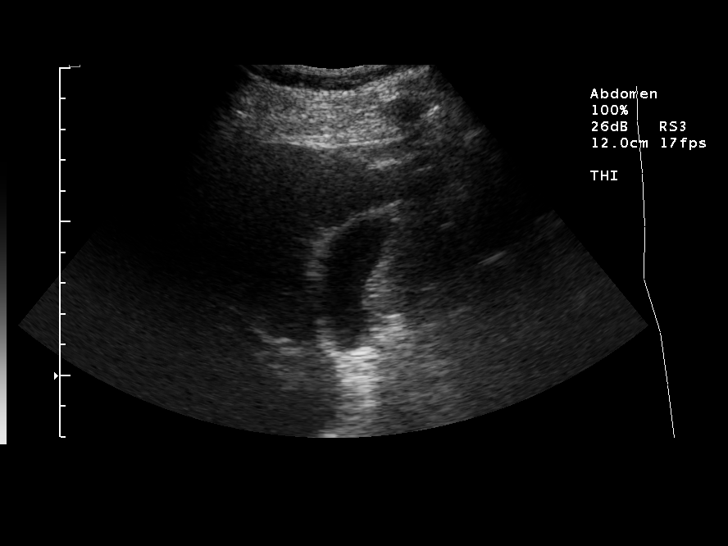

[13 of 25 positions shown; findings below may reference images not displayed]

FINDINGS: The proximal aorta is difficult to see.  The mid and distal aorta are normal in caliber.  Visualization of the pancreas is limited.  Nonshadowing echogenic material is seen within the gallbladder.  No gallbladder wall thickening, pericholecystic fluid. or sonographic Murphy's sign.  The extrahepatic bile duct measures 3 mm.  Flow within the portal vein is hepatopetal.  The IVC is visualized.  The liver is increased in echogenicity.  The length is 14.5 cm.  The spleen is unremarkable.
The kidneys are normal in length.  There is a 6 cm shadowing echogenic lesion in the lower pole of the right kidney.  On the left, there is a 4.4 x 3.7 x 4.2 cm anechoic mass with increased through transmission.  A thin-walled lesion is seen within, giving a "cyst within a cyst" appearance.   There is a left pleural effusion.
IMPRESSION: 1.  Gallbladder sludge without evidence of acute cholecystitis.
2.  Fatty liver.
3.  Nonobstructing right renal calculus.
4.  Cyst within a cyst" appearance of a left renal cyst.  Given the stability and size from CT abdomen dated 02/02/03, this is likely a minimally complex cyst.

## 2007-04-12 ENCOUNTER — Emergency Department (HOSPITAL_COMMUNITY): Admission: EM | Admit: 2007-04-12 | Discharge: 2007-04-12 | Payer: Self-pay | Admitting: Emergency Medicine

## 2007-06-03 ENCOUNTER — Ambulatory Visit: Payer: Self-pay | Admitting: Internal Medicine

## 2007-07-26 ENCOUNTER — Emergency Department (HOSPITAL_COMMUNITY): Admission: EM | Admit: 2007-07-26 | Discharge: 2007-07-27 | Payer: Self-pay | Admitting: Emergency Medicine

## 2007-11-08 ENCOUNTER — Emergency Department (HOSPITAL_COMMUNITY): Admission: EM | Admit: 2007-11-08 | Discharge: 2007-11-08 | Payer: Self-pay | Admitting: Emergency Medicine

## 2007-11-28 IMAGING — CR DG CHEST 2V
2 series · 2 of 2 positions shown · non-contrast
Comparison: 07/24/05 and CT chest on the same day.

CLINICAL DATA: 73-year-old with chest pain, shortness of breath.  Abdominal pain.  Prior bypass pacemaker.  MI.  CVA.  COPD.  Patient reports that he has lung cancer and takes medicine for hypertension.  
CHEST - 2 VIEW:

[w chest pa]
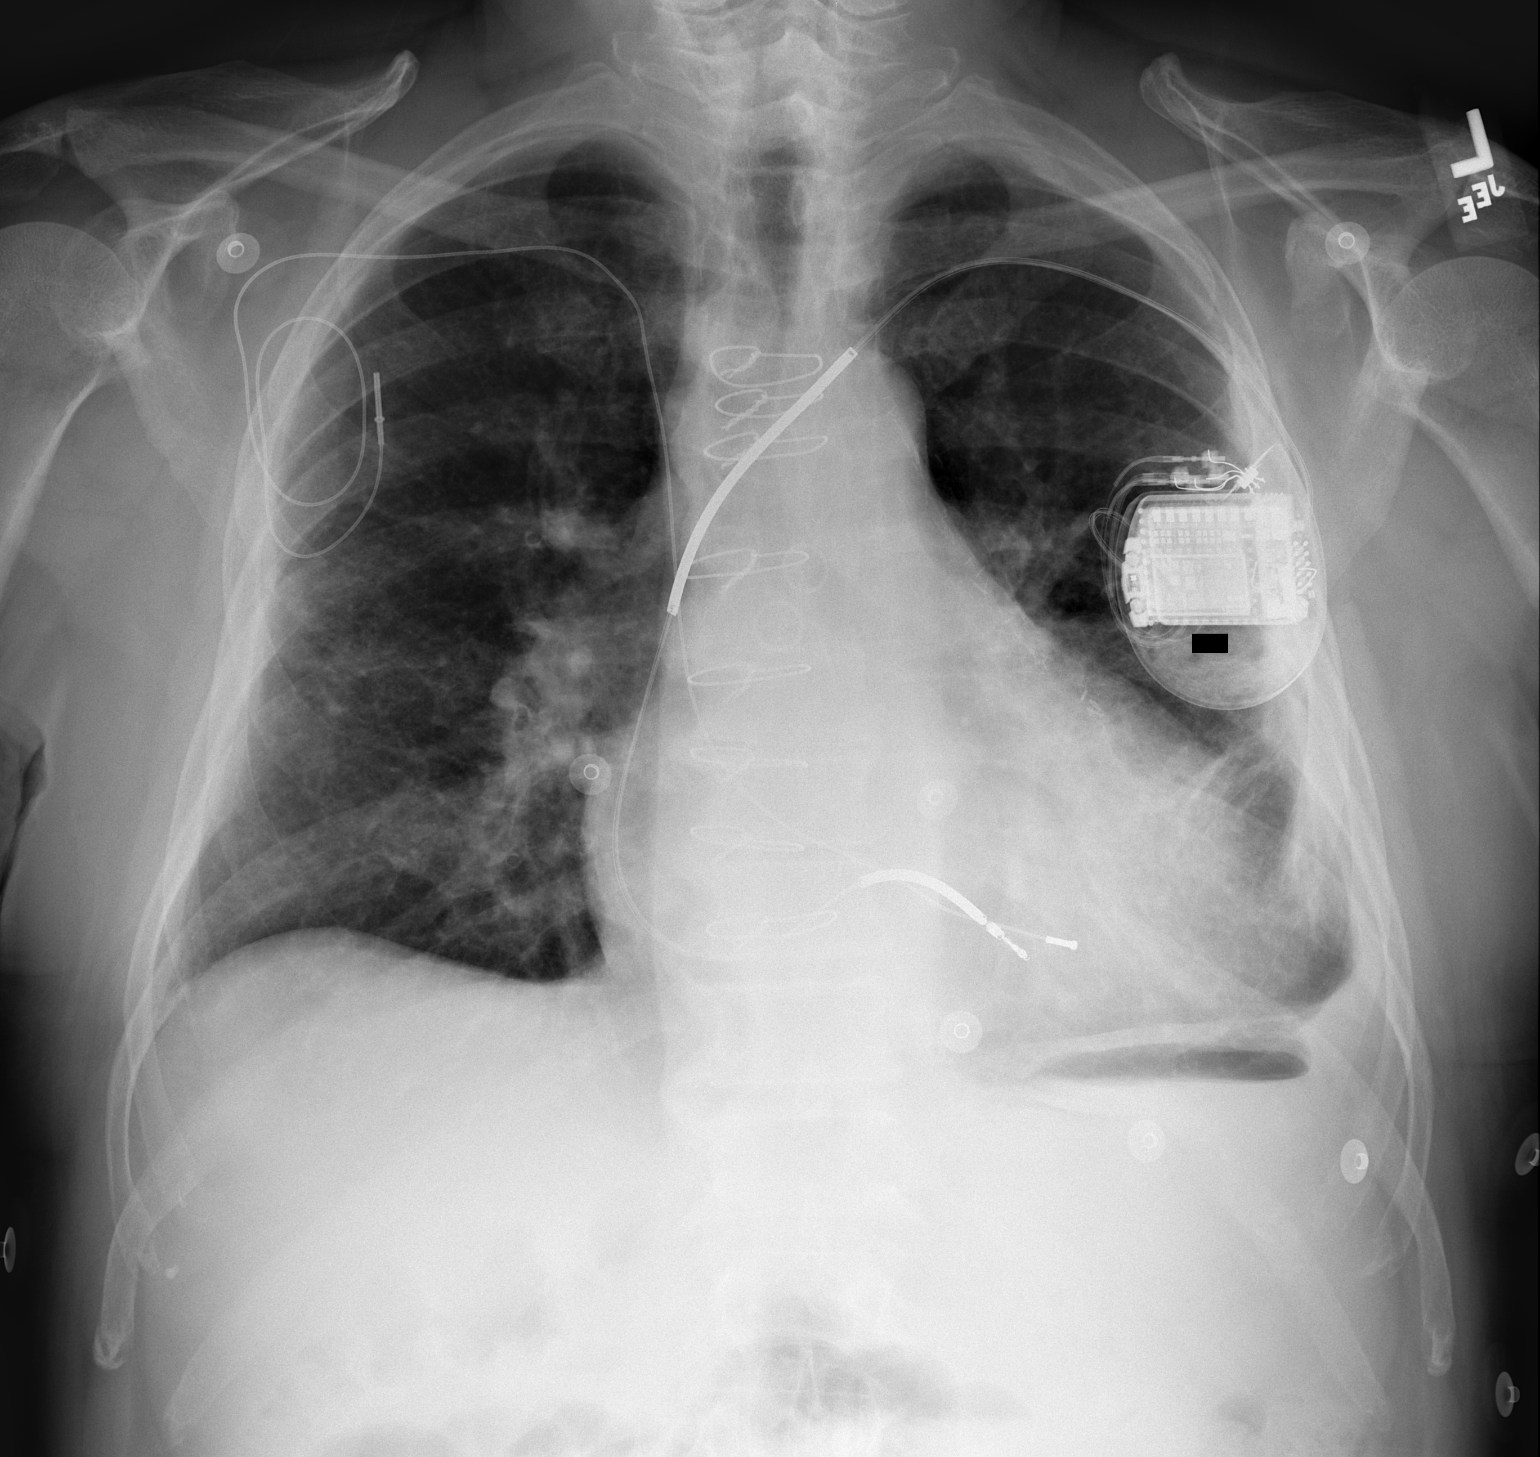

[w chest lat]
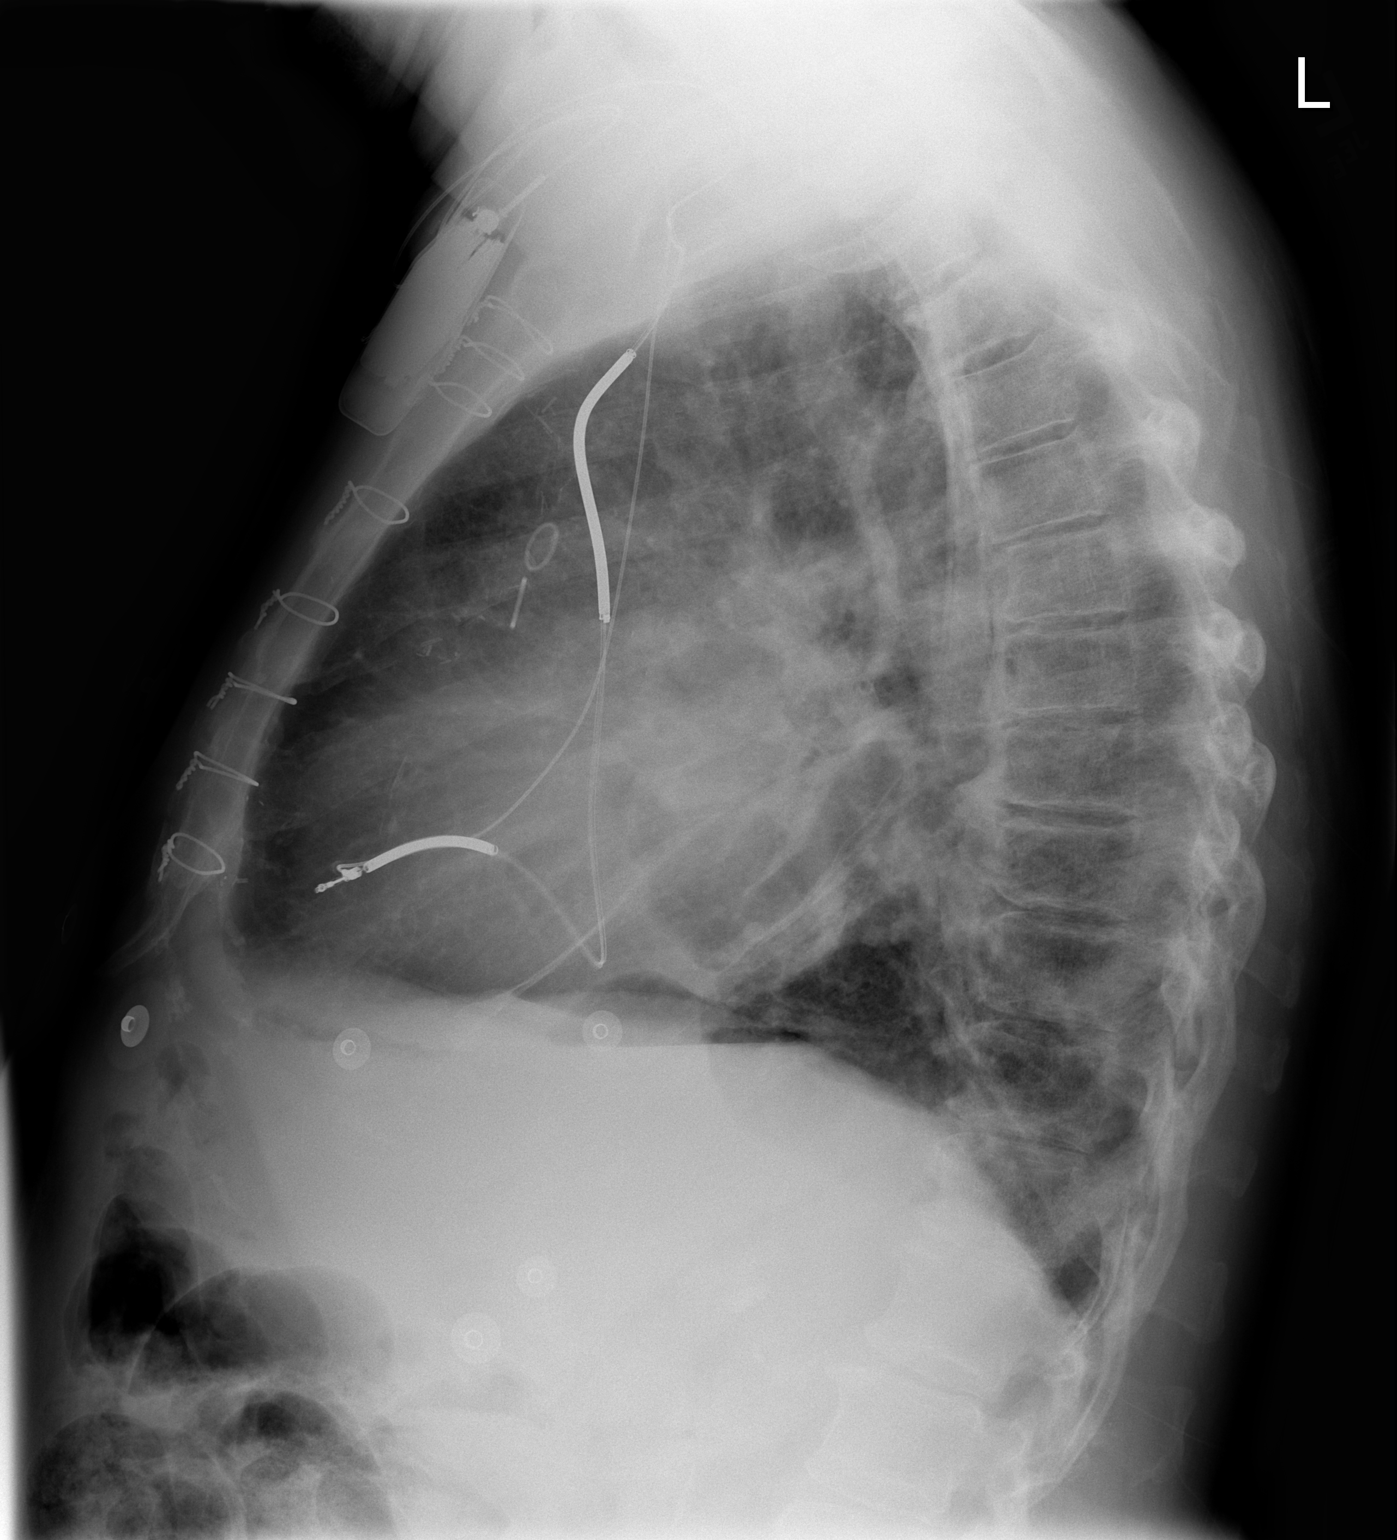

[2 of 2 positions shown; findings below may reference images not displayed]

FINDINGS: Patient has left sided transvenous pacemaker with leads overlying the right ventricle.  An old right pacer lead has its tip overlying the right ventricle.  The heart is enlarged.   There are perihilar bronchitic changes.  Small left pleural effusion is identified.  Patchy density seen in the lateral portion of the left lung is felt to be stable compared to multiple prior studies including [DATE].  No focal consolidations are identified.
IMPRESSION: 1.  Cardiomegaly and bronchitic changes. 
2.  Left pleural effusion and left base change appear stable.

## 2007-11-29 IMAGING — CT CT PELVIS W/ CM
3 of 5 series · 14 of 32 positions shown, 19 images · IV contrast ([ID]/WATER & 50 ML OMNI 300)
Comparison: 07/27/2005

ABDOMEN CT WITH CONTRAST

CLINICAL DATA: Upper abdominal pain, nausea, vomiting
TECHNIQUE: Multidetector CT imaging of the abdomen and pelvis was performed
following the standard protocol during bolus administration of intravenous
contrast.

Contrast:  50 cc Omnipaque 300

[Series 2: routine abdomen · axial · 0.76mm/px · z∈[-429,-159]mm · 4 of 90 slices shown, 9 images]
[im 18/90  soft-tissue]
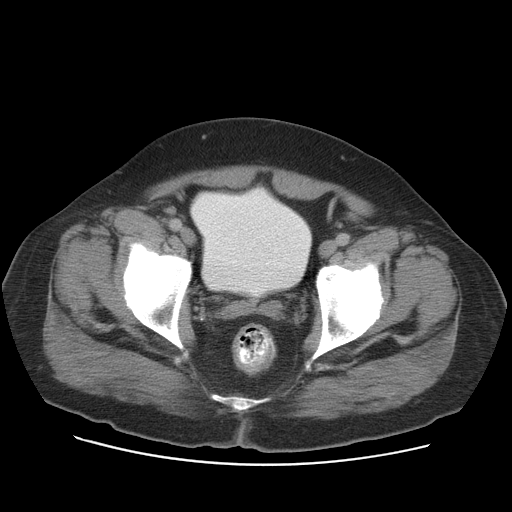
[im 18/90  lung]
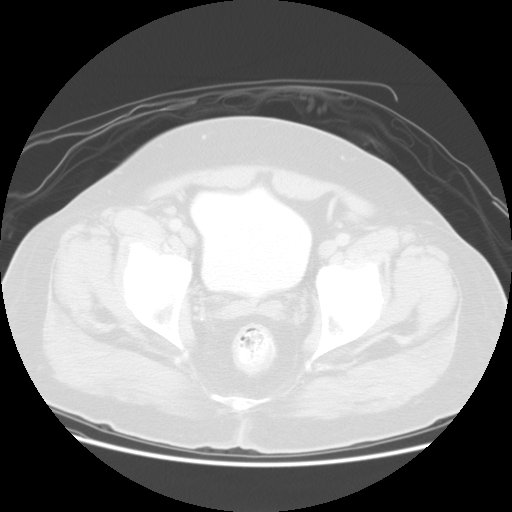
[im 18/90  bone]
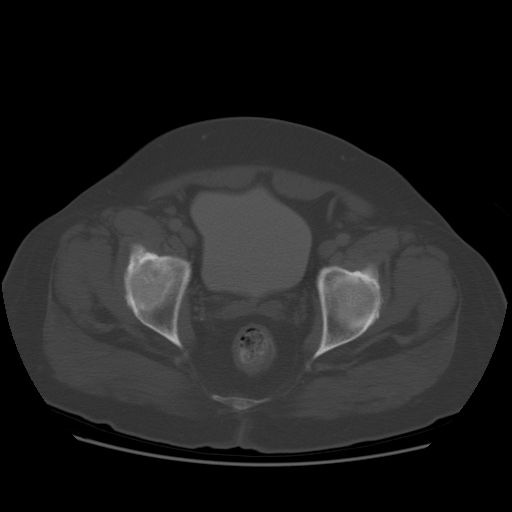
[im 36/90  soft-tissue]
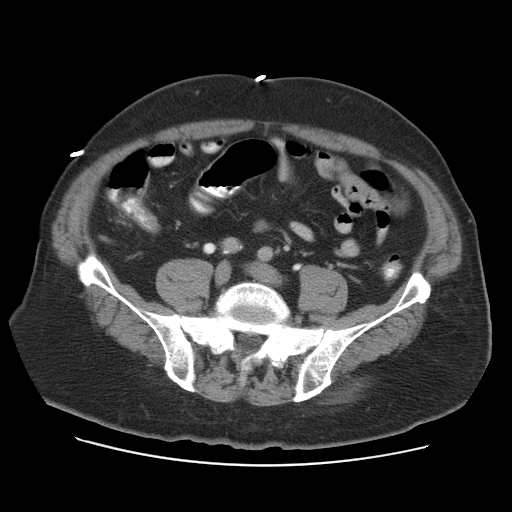
[im 36/90  lung]
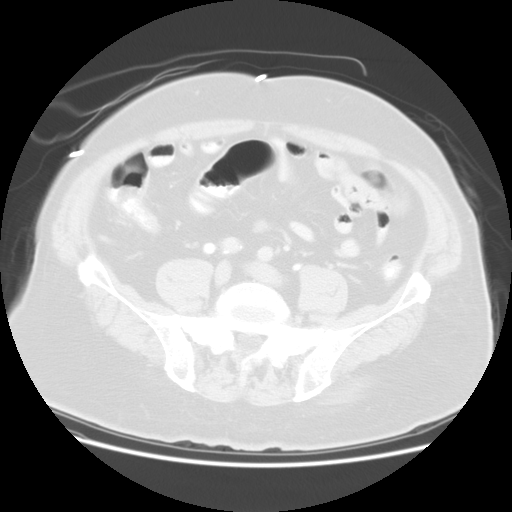
[im 54/90  soft-tissue]
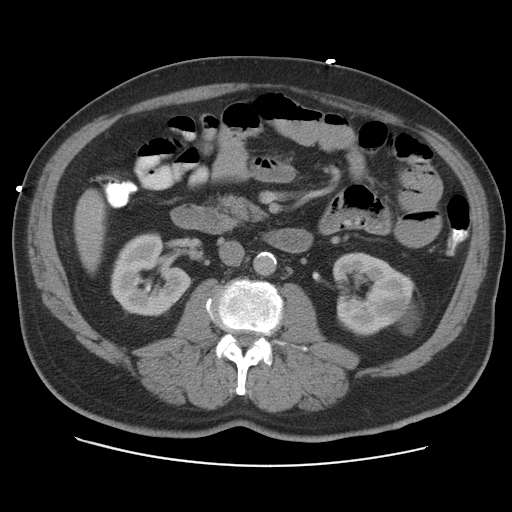
[im 54/90  lung]
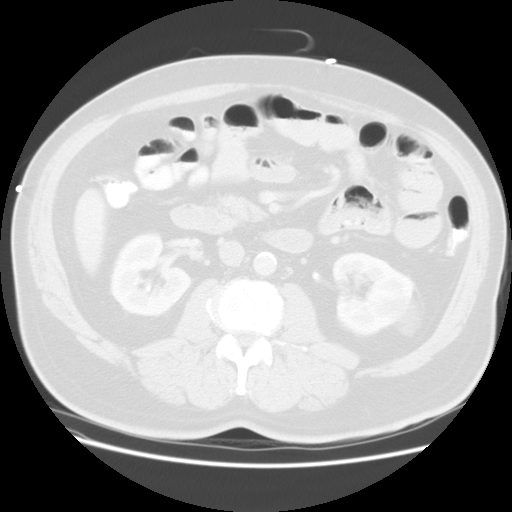
[im 72/90  soft-tissue]
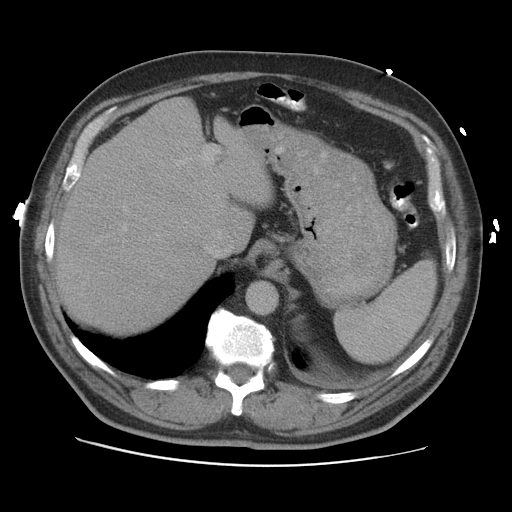
[im 72/90  lung]
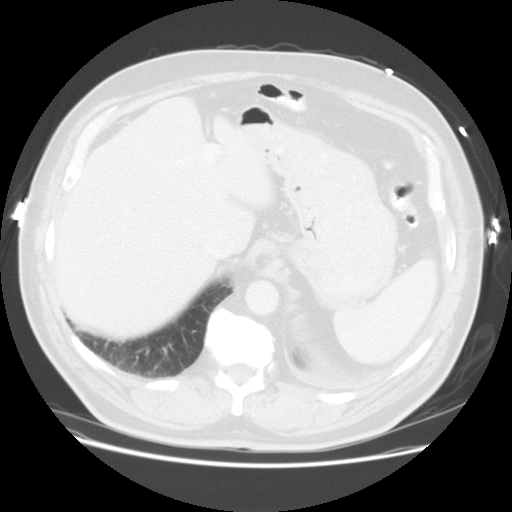

[Series 400: reformatted · sagittal · 0.89mm/px · 8 of 180 slices shown (1 of 2)]
[im 17/180  soft-tissue]
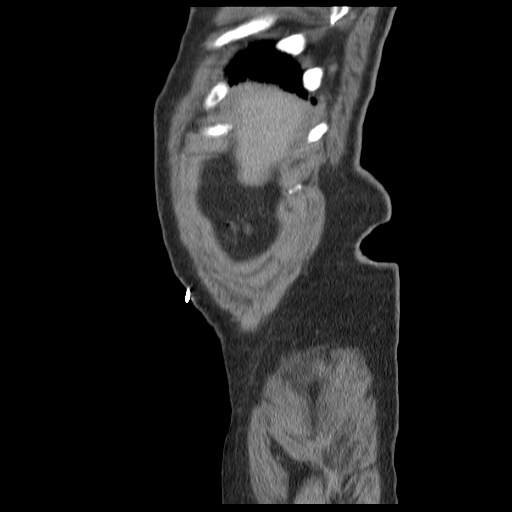
[im 33/180  soft-tissue]
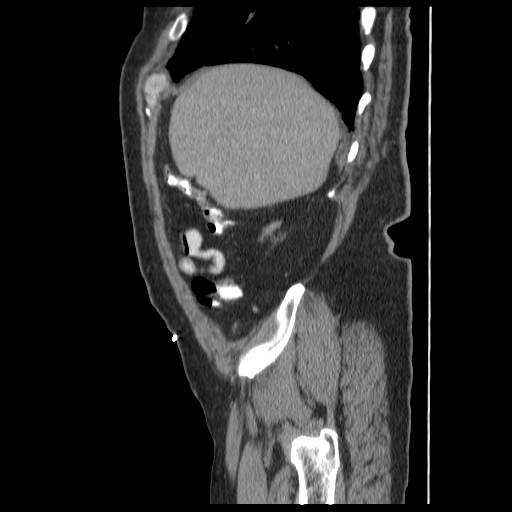
[im 66/180  soft-tissue]
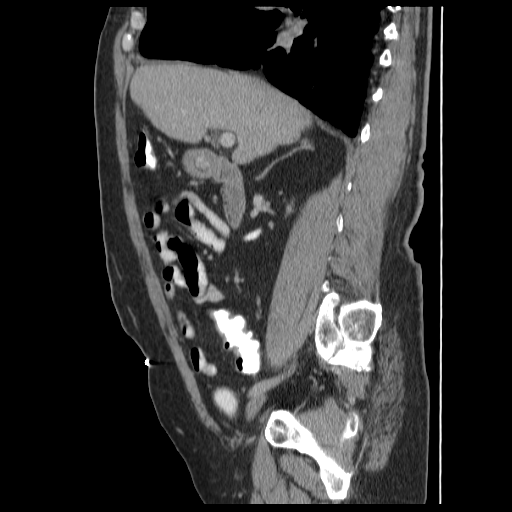
[im 82/180  soft-tissue]
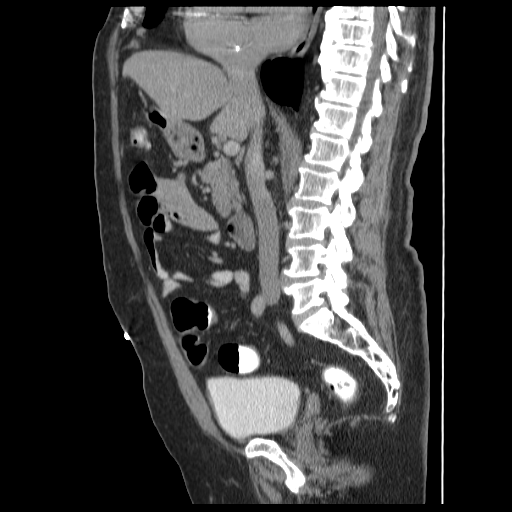
[im 98/180  soft-tissue]
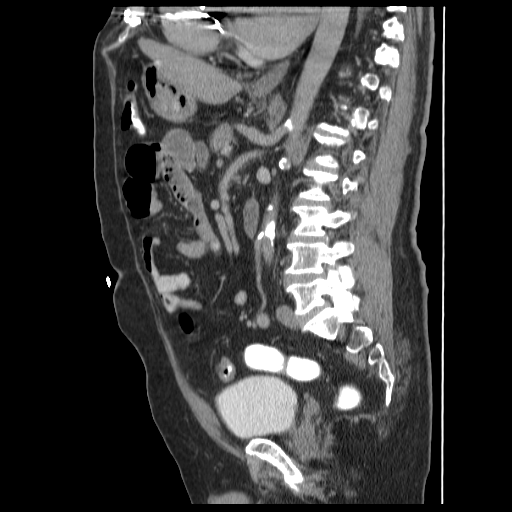
[im 114/180  soft-tissue]
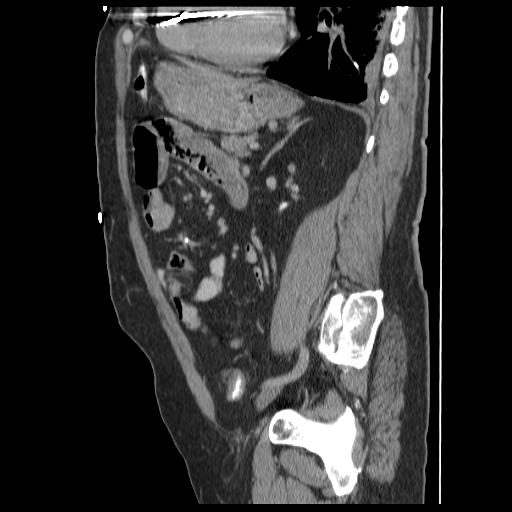
[im 147/180  soft-tissue]
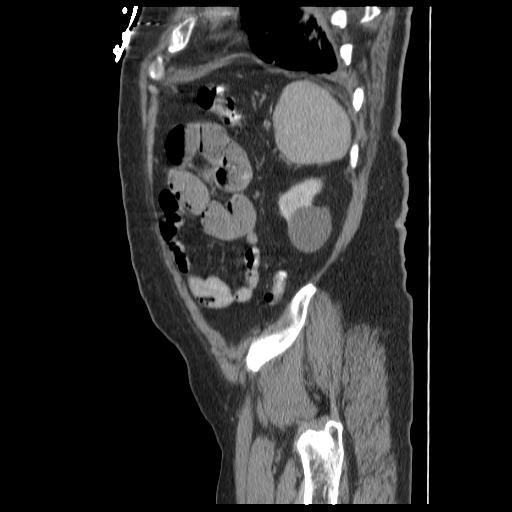
[im 163/180  soft-tissue]
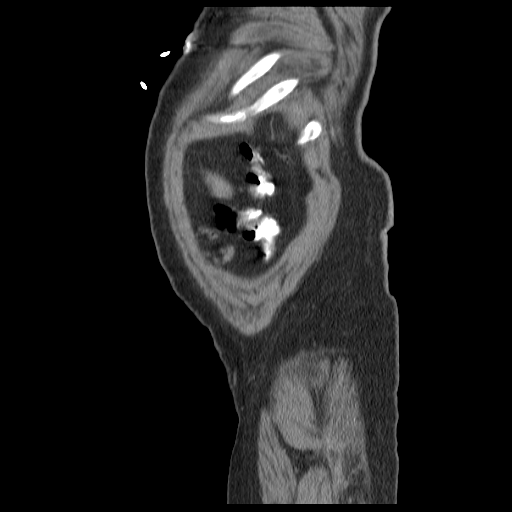

[Series 401: reformatted · coronal · 0.89mm/px · 2 of 142 slices shown (2 of 2)]
[im 16/142  soft-tissue]
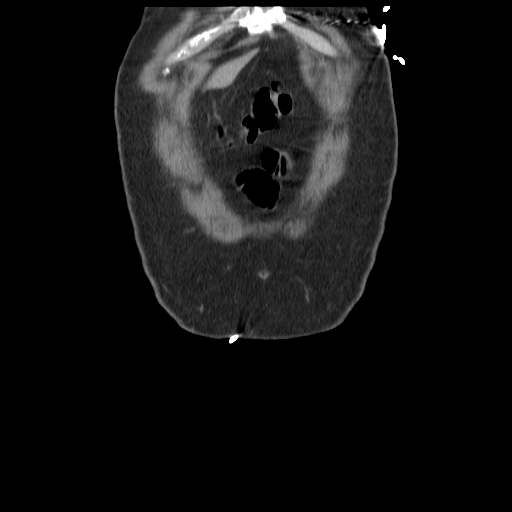
[im 32/142  soft-tissue]
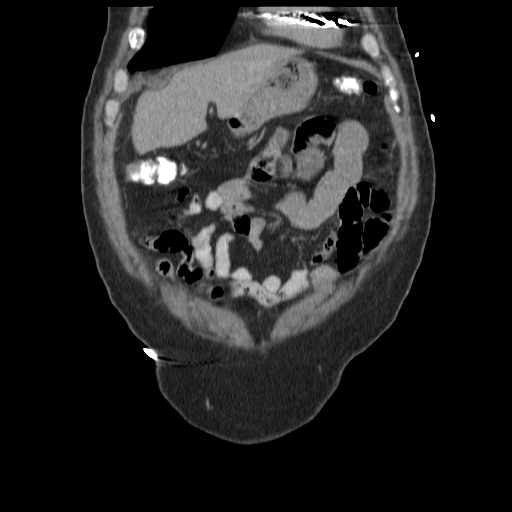

[14 of 32 positions shown; findings below may reference images not displayed]

FINDINGS: Liver, spleen, pancreas, adrenals, gallbladder are unremarkable.
There are bilateral nonobstructing small renal calculi, and a benign appearing
left renal cyst. All grossly unremarkable.

Chronic changes again noted at the left lung base. Calcification noted at the
cardiac apex compatible with old infarct.

IMPRESSION

No acute findings in the abdomen.

Small bilateral nonobstructing renal calculi.

Old cardiac apical infarct which contains calcifications, similar to prior
study.

PELVIS CT WITH CONTRAST
FINDINGS: Appendix is visualized and is normal. Bowel grossly unremarkable.
Pelvic structures unremarkable. No free fluid, free air, or adenopathy.

Degenerative changes noted in the lumbar spine.

IMPRESSION

No acute findings in the pelvis.

## 2007-12-04 IMAGING — CR DG CHEST 2V
2 series · 2 of 2 positions shown · non-contrast
Comparison: 04/29/06.

CLINICAL DATA: Shortness of breath.  Difficulty sleeping.  
 CHEST - 2 VIEW:

[w chest pa]
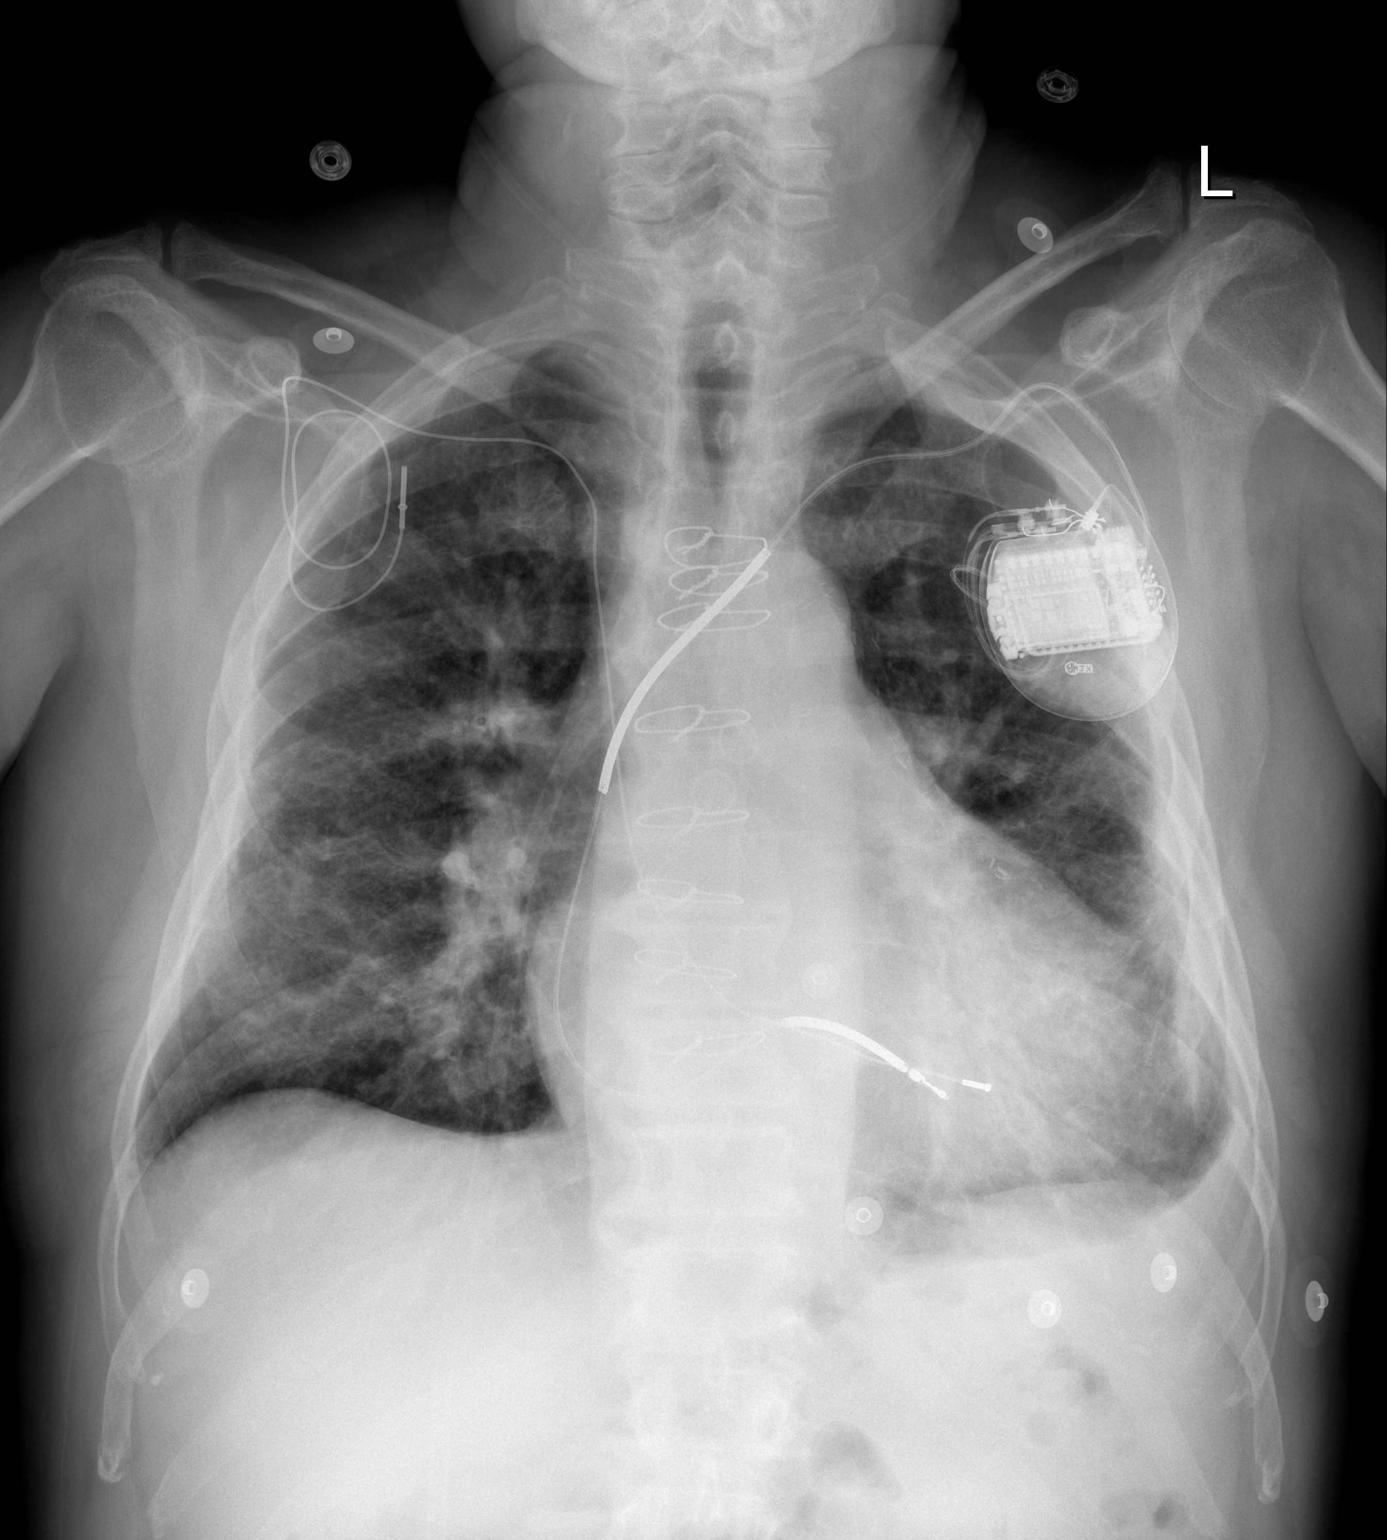

[w chest lat]
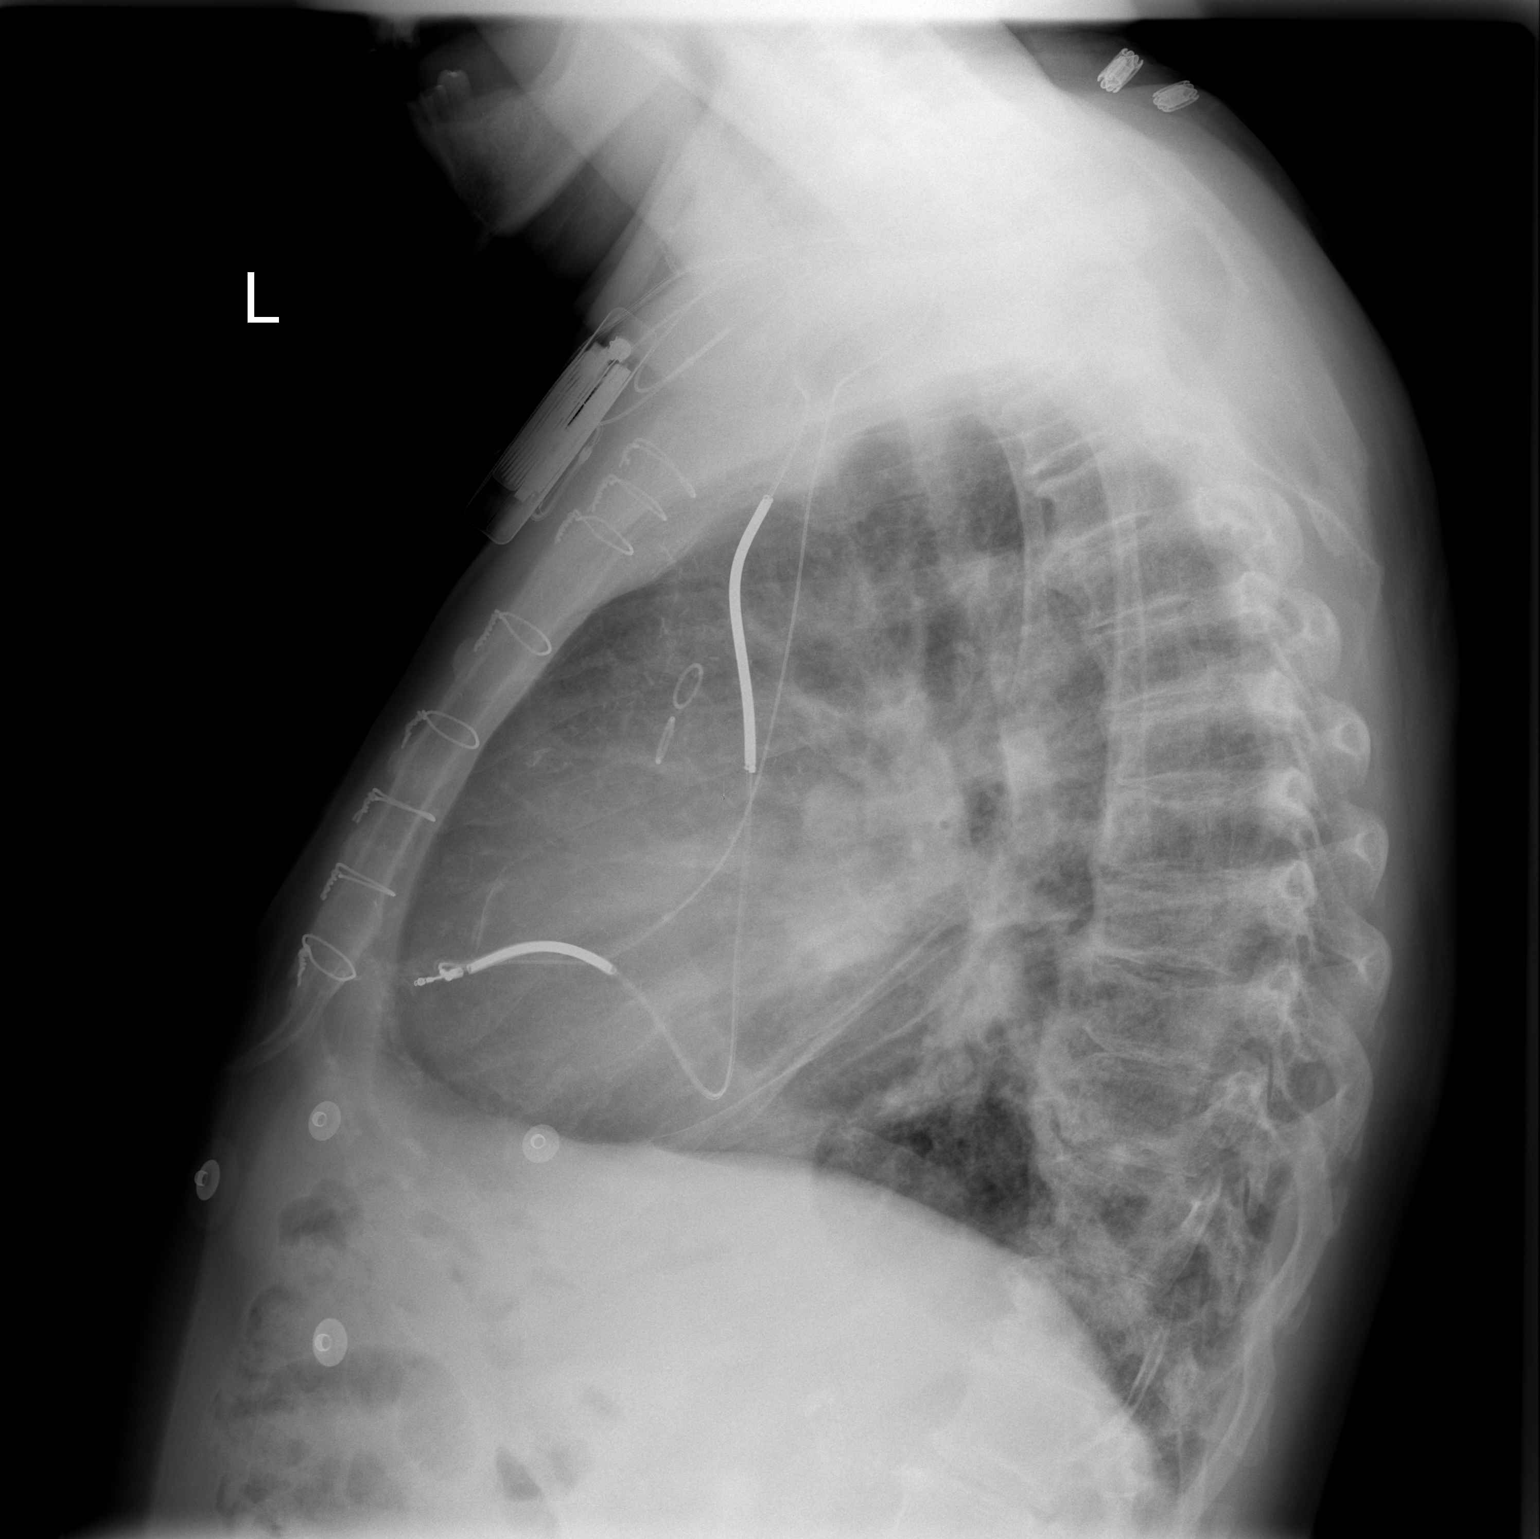

[2 of 2 positions shown; findings below may reference images not displayed]

FINDINGS: There is a left chest wall pacer with lead in the right ventricle.  The heart size is enlarged.  Interval increase in left effusion.  There is increased interstitial markings likely representing interstitial edema.
IMPRESSION: Cardiac enlargement with mild CHF.

## 2007-12-30 IMAGING — CR DG CHEST 2V
2 series · 2 of 2 positions shown · non-contrast
Comparison: 05/05/06.

CLINICAL DATA: Short of breath. Cough.  Congestion.
 CHEST - 2 VIEWS:

[view not recorded (1 of 2)]
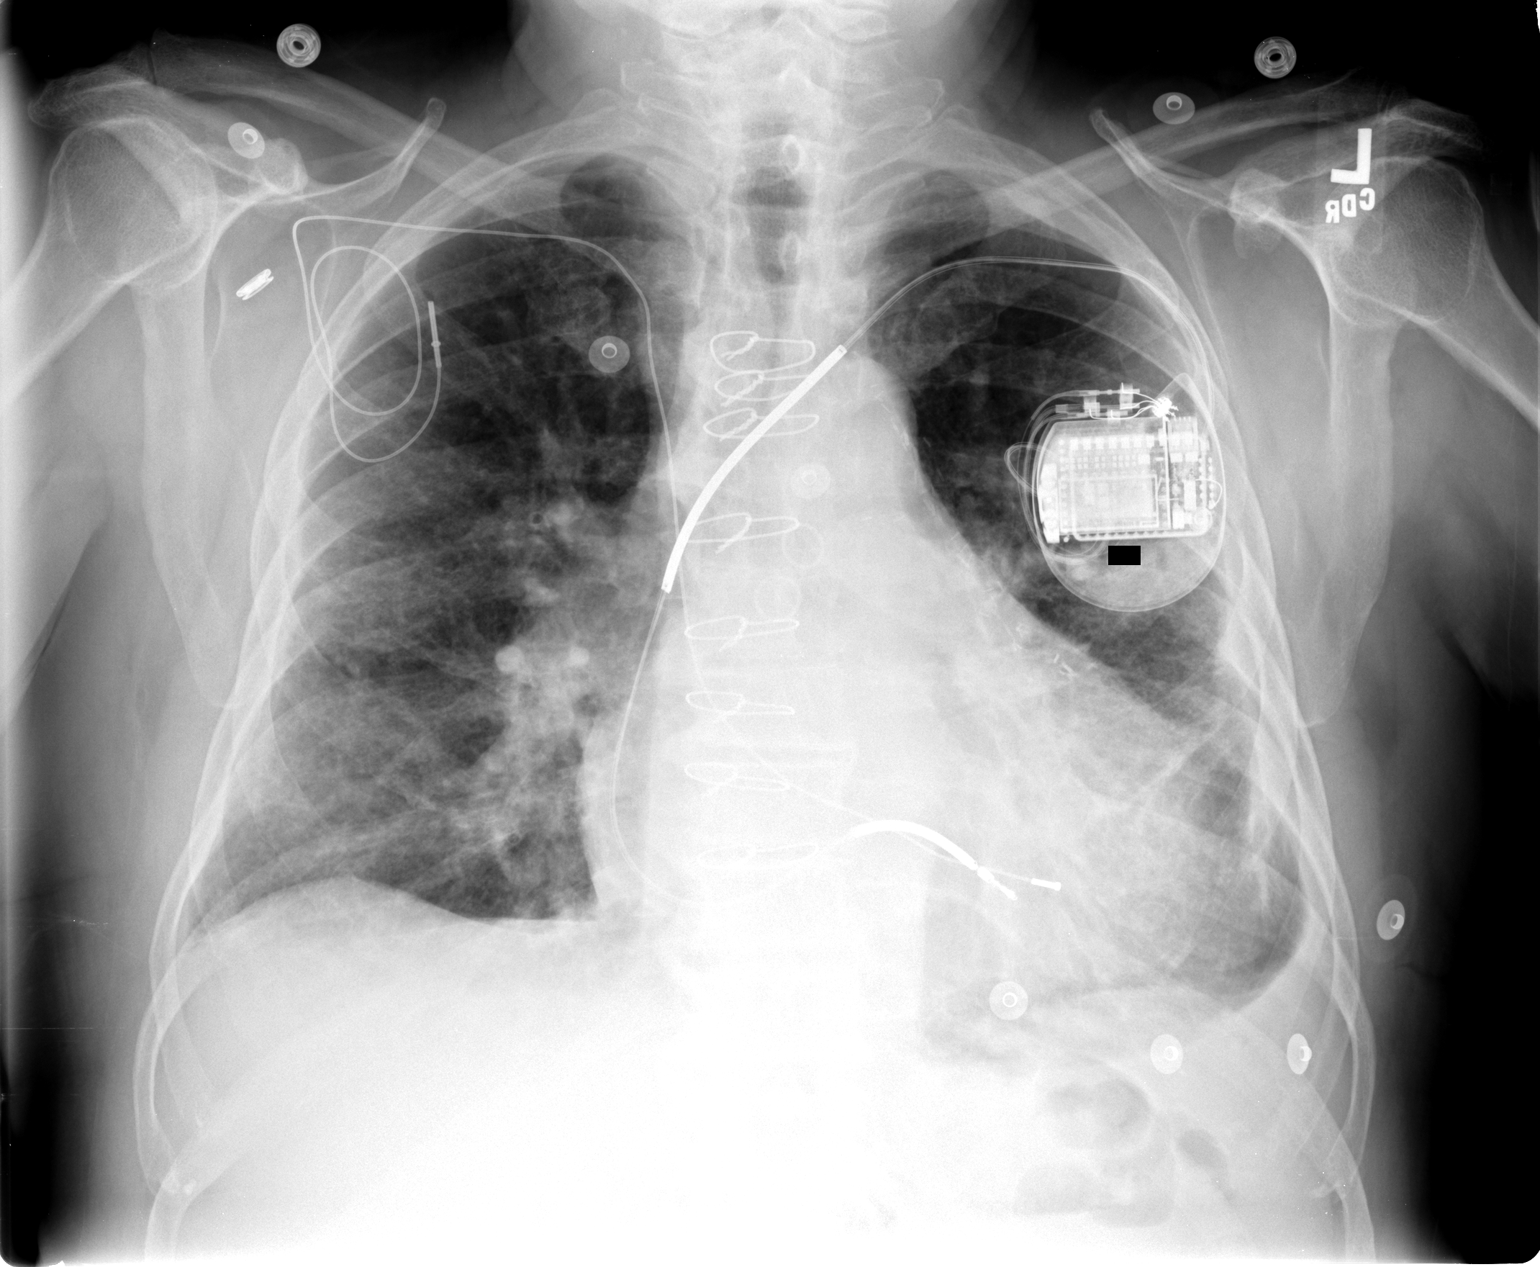

[view not recorded (2 of 2)]
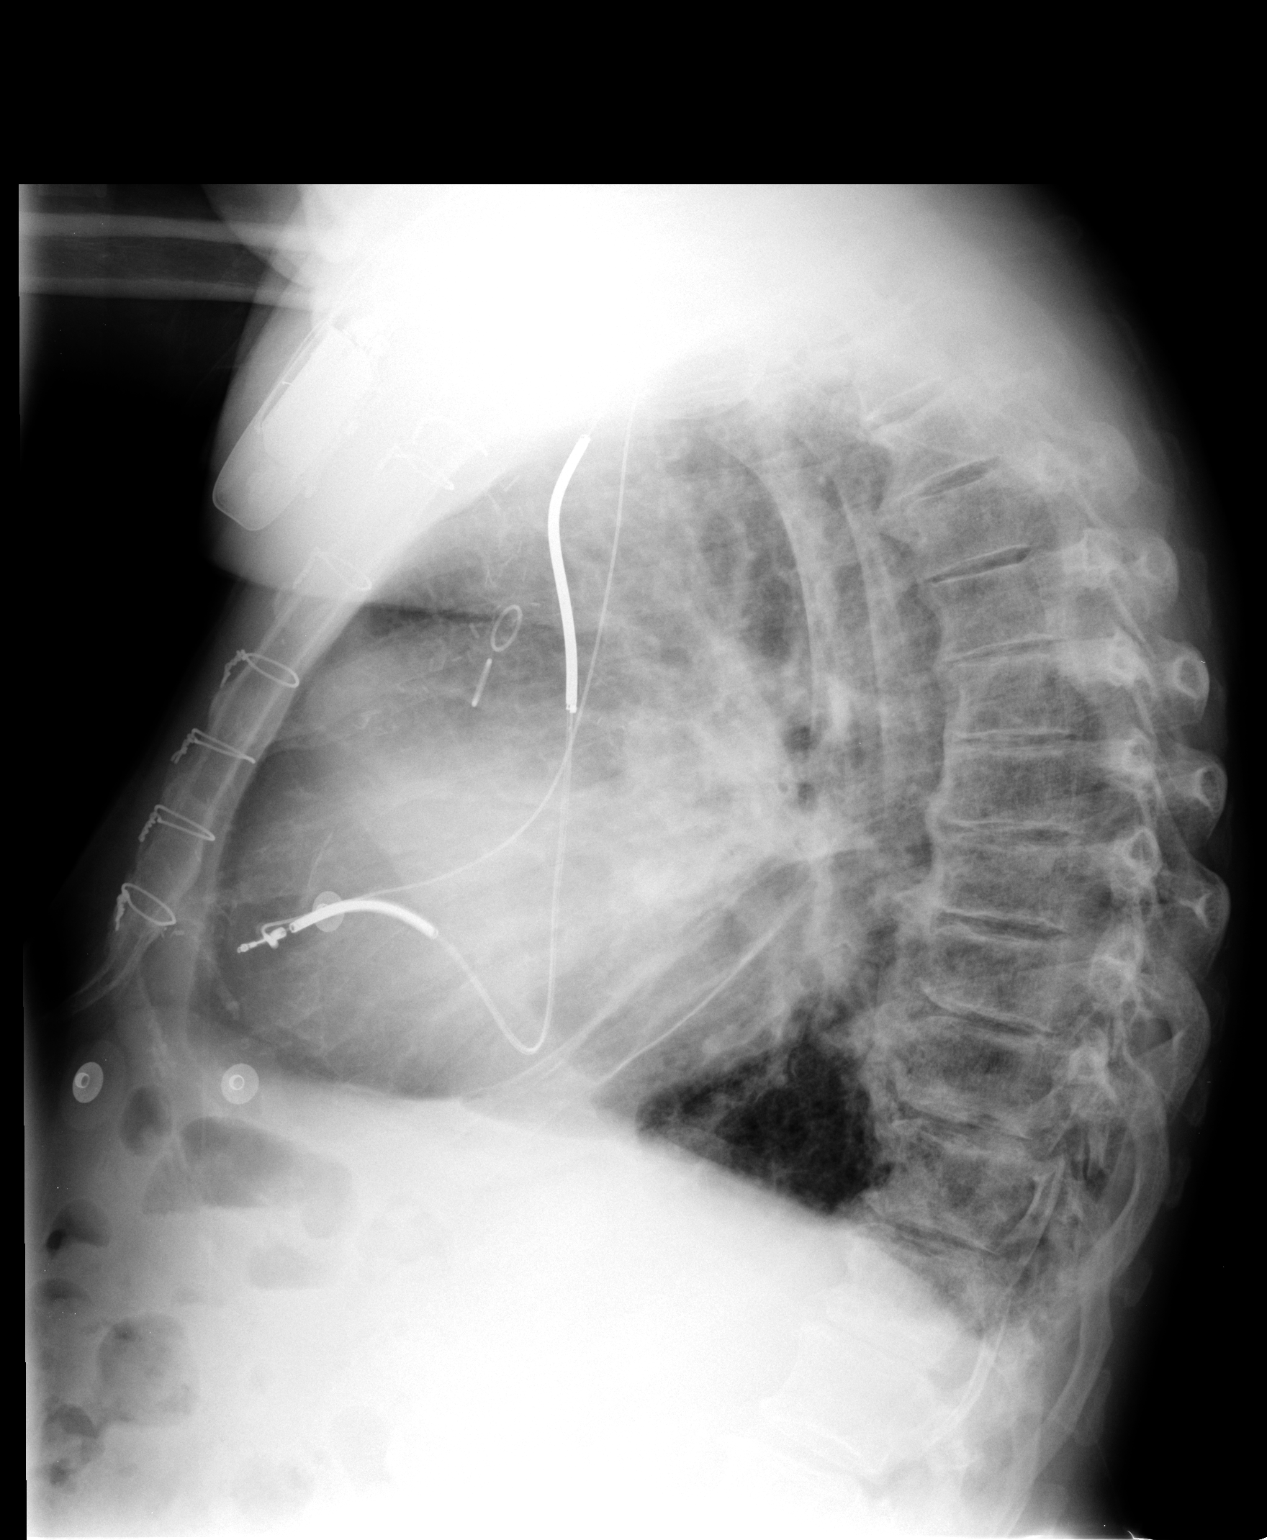

[2 of 2 positions shown; findings below may reference images not displayed]

FINDINGS: Two views of the chest show no significant change in moderate cardiomegaly and pulmonary vascular congestion.   Chronic changes are stable. Pacer with AICD lead remains.
IMPRESSION: No change in moderate cardiomegaly with pulmonary vascular congestion.

## 2008-01-26 IMAGING — CR DG CHEST 2V
2 series · 2 of 2 positions shown · non-contrast
Comparison: 06/26/06.

CLINICAL DATA: 73 year old male; congestive heart failure, left chest pain.
2-VIEW CHEST RADIOGRAPH ? 06/27/06:

[w chest pa]
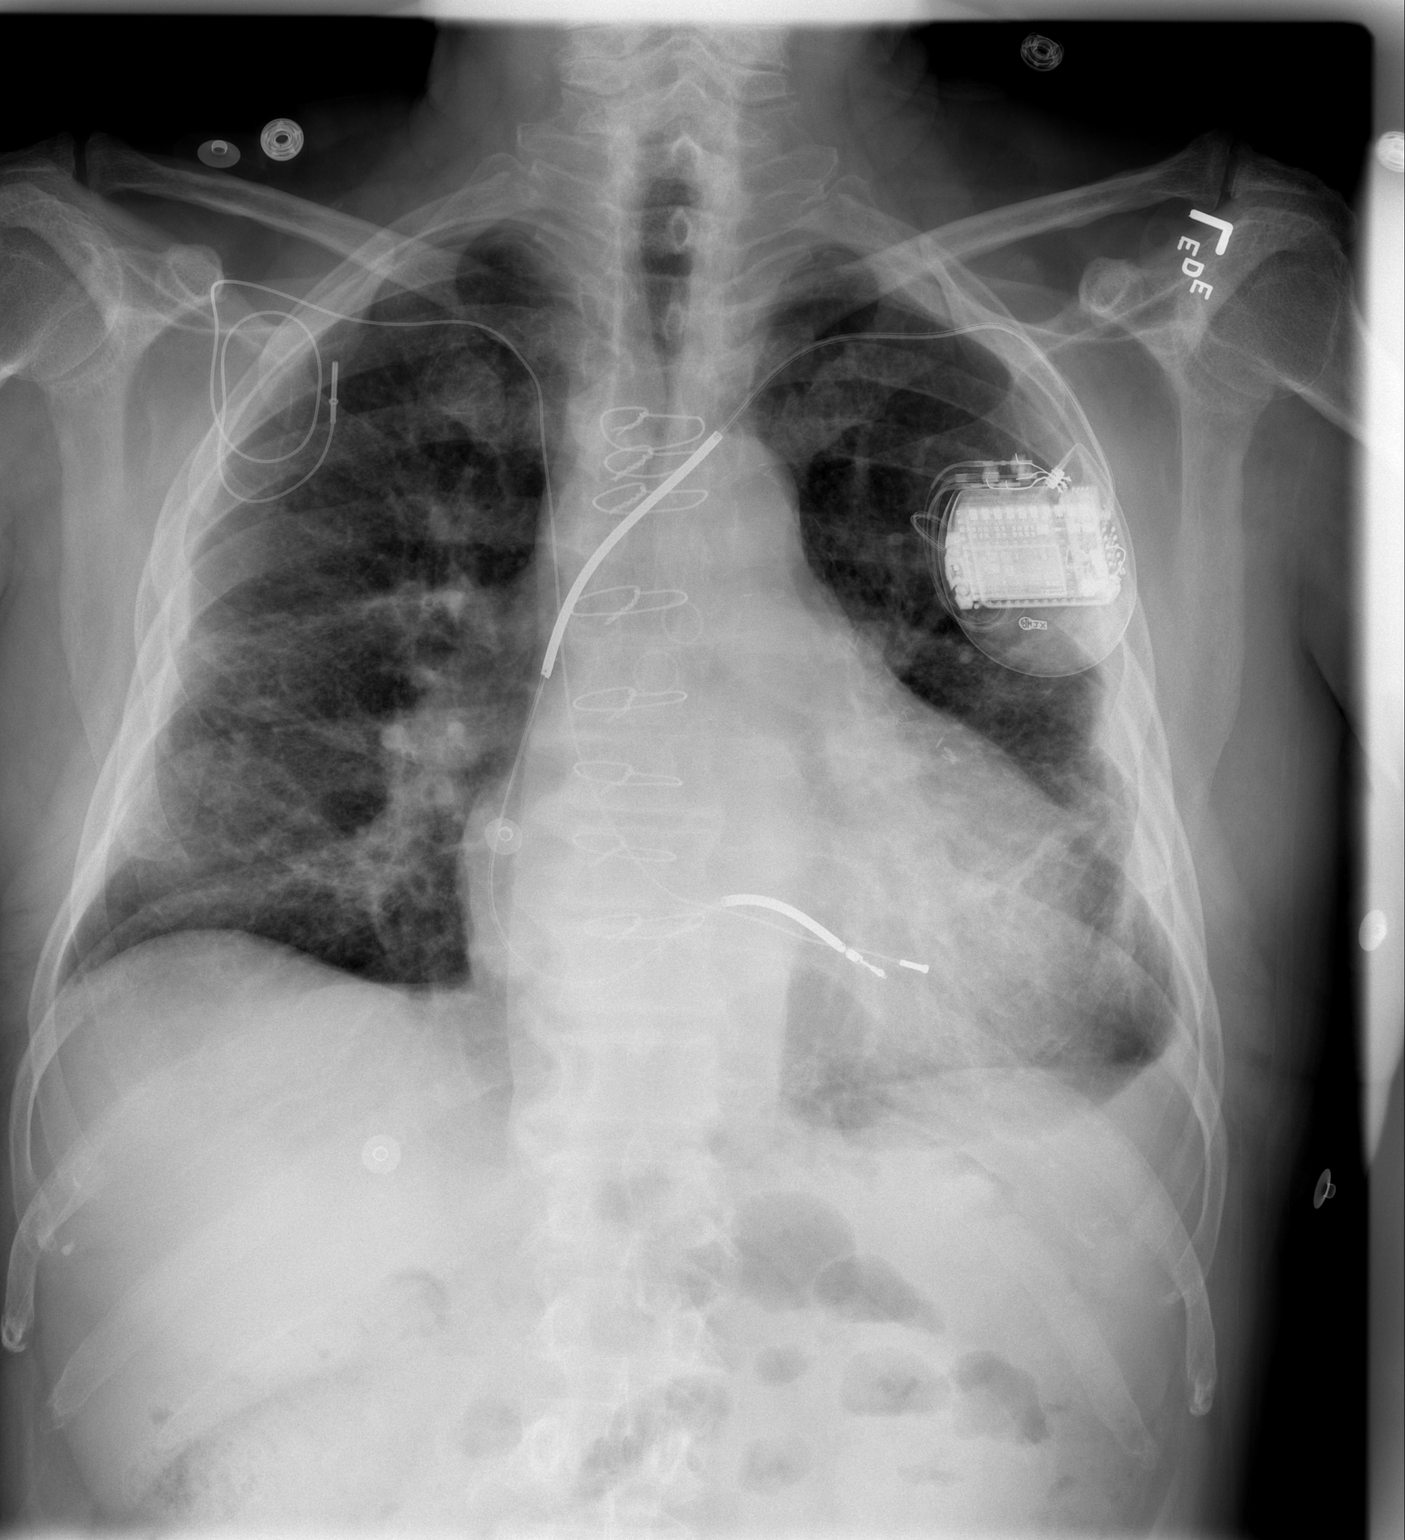

[w chest lat]
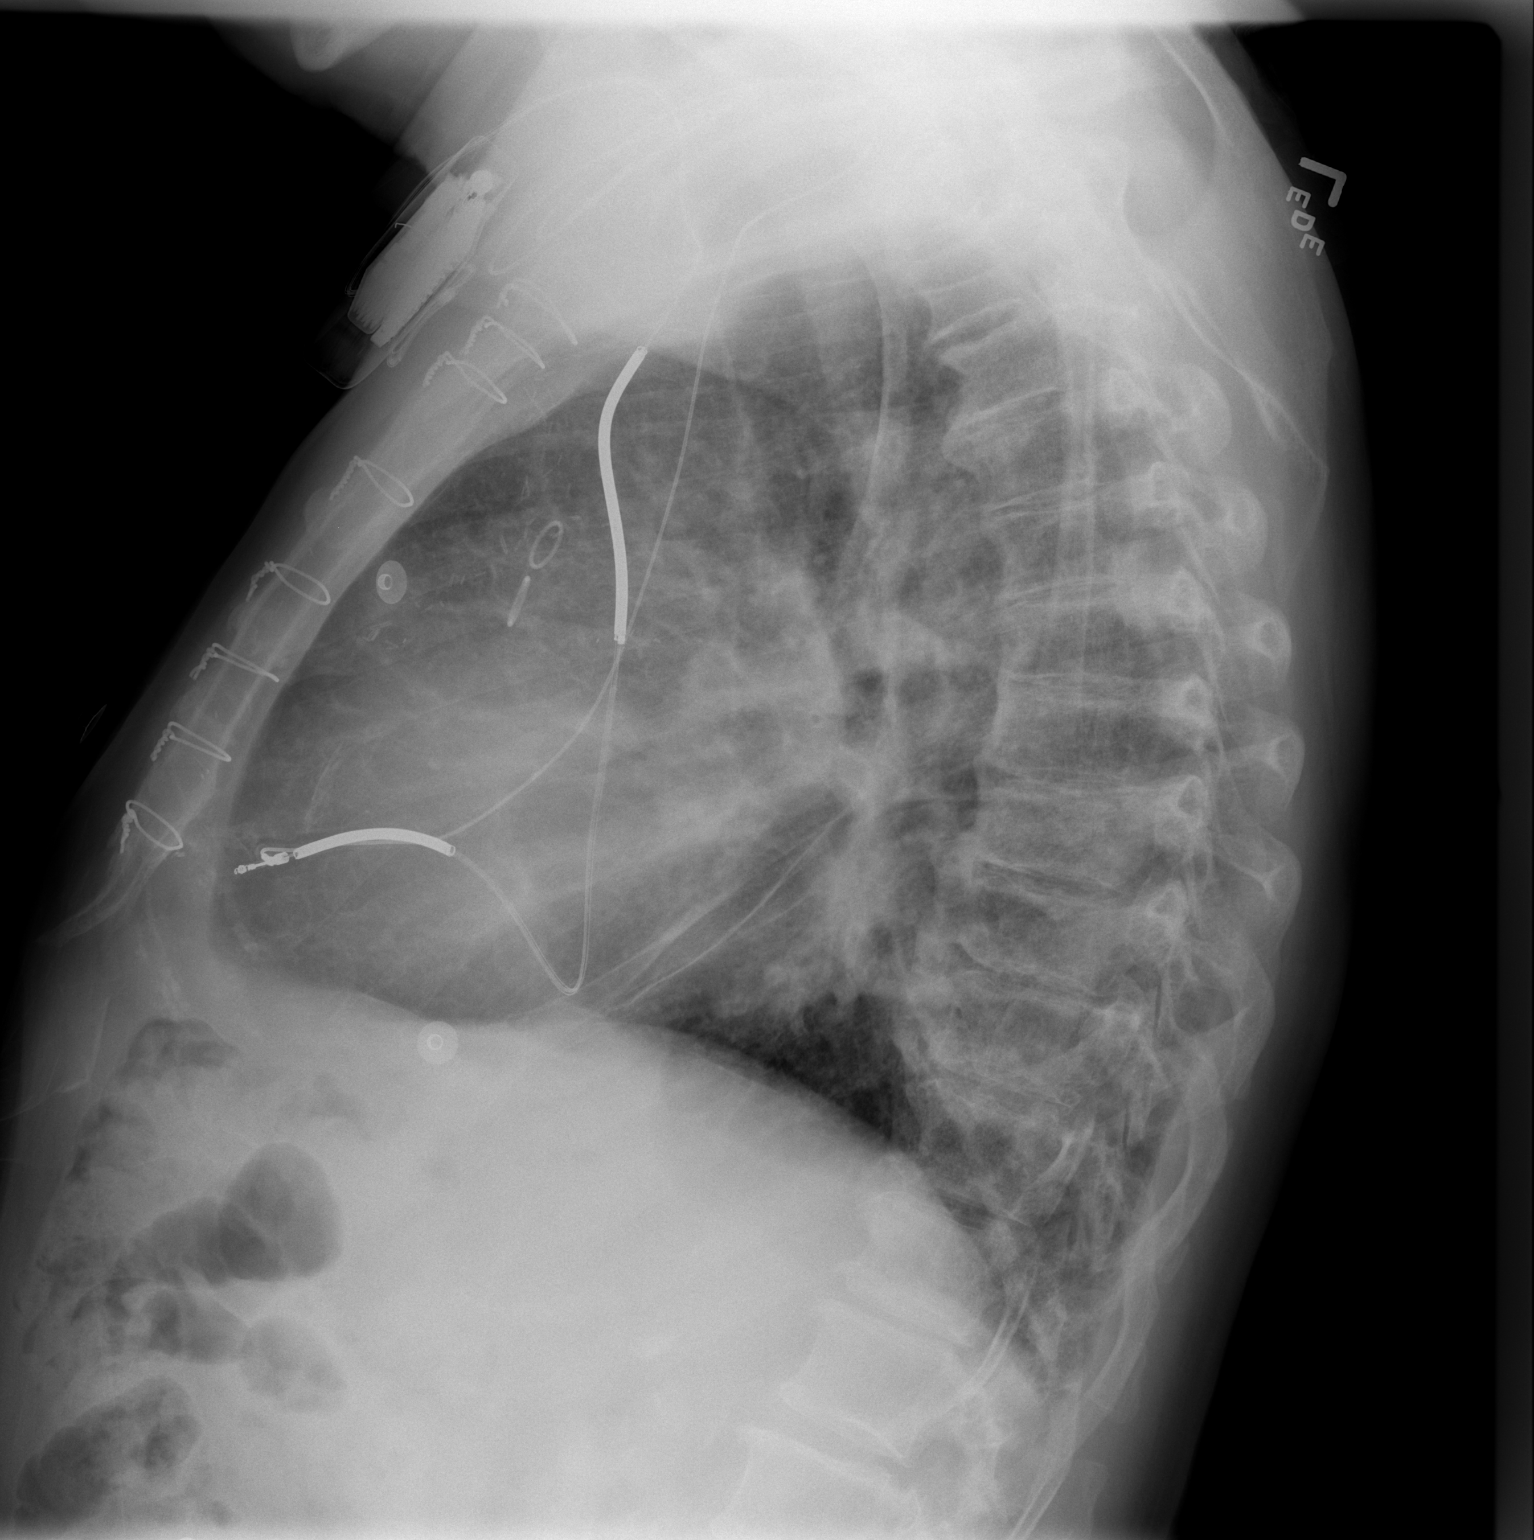

[2 of 2 positions shown; findings below may reference images not displayed]

FINDINGS: A left subclavian AICD is noted.  Remote right subclavian pacer wires remain. The heart is enlarged with mild interstitial edema, bronchial thickening, and lower lobe atelectasis versus scarring.  A small effusion is noted on the left with pleural thickening.
IMPRESSION: 1.  Stable mild edema pattern, bronchial thickening, and bibasilar scarring.
2.  Small left effusion versus pleural thickening.  
3.  No new finding.

## 2008-03-07 ENCOUNTER — Inpatient Hospital Stay (HOSPITAL_COMMUNITY): Admission: EM | Admit: 2008-03-07 | Discharge: 2008-03-09 | Payer: Self-pay | Admitting: Emergency Medicine

## 2008-05-05 ENCOUNTER — Encounter (INDEPENDENT_AMBULATORY_CARE_PROVIDER_SITE_OTHER): Payer: Self-pay | Admitting: *Deleted

## 2008-05-05 ENCOUNTER — Ambulatory Visit (HOSPITAL_COMMUNITY): Admission: RE | Admit: 2008-05-05 | Discharge: 2008-05-05 | Payer: Self-pay | Admitting: *Deleted

## 2008-05-10 HISTORY — PX: SHOULDER ARTHROSCOPY W/ ROTATOR CUFF REPAIR: SHX2400

## 2008-05-21 ENCOUNTER — Ambulatory Visit (HOSPITAL_COMMUNITY): Admission: RE | Admit: 2008-05-21 | Discharge: 2008-05-22 | Payer: Self-pay | Admitting: Orthopedic Surgery

## 2008-05-26 ENCOUNTER — Encounter: Payer: Self-pay | Admitting: Internal Medicine

## 2008-06-01 DIAGNOSIS — I2589 Other forms of chronic ischemic heart disease: Secondary | ICD-10-CM

## 2008-06-01 DIAGNOSIS — I482 Chronic atrial fibrillation, unspecified: Secondary | ICD-10-CM | POA: Insufficient documentation

## 2008-06-01 DIAGNOSIS — I459 Conduction disorder, unspecified: Secondary | ICD-10-CM

## 2008-06-02 ENCOUNTER — Encounter: Payer: Self-pay | Admitting: Internal Medicine

## 2008-06-02 ENCOUNTER — Ambulatory Visit: Payer: Self-pay | Admitting: Internal Medicine

## 2008-06-14 ENCOUNTER — Ambulatory Visit (HOSPITAL_COMMUNITY): Admission: RE | Admit: 2008-06-14 | Discharge: 2008-06-14 | Payer: Self-pay | Admitting: Urology

## 2008-06-21 ENCOUNTER — Encounter: Admission: RE | Admit: 2008-06-21 | Discharge: 2008-07-06 | Payer: Self-pay | Admitting: Orthopedic Surgery

## 2008-07-03 IMAGING — CR DG CHEST 1V PORT
1 series · 1 of 1 positions shown · non-contrast
Comparison: 07/04/06 and chest CT 04/29/06.

CLINICAL DATA: Shortness of breath, lung cancer.  
 PORTABLE CHEST - 1 VIEW ? 12/03/06:

[view not recorded]
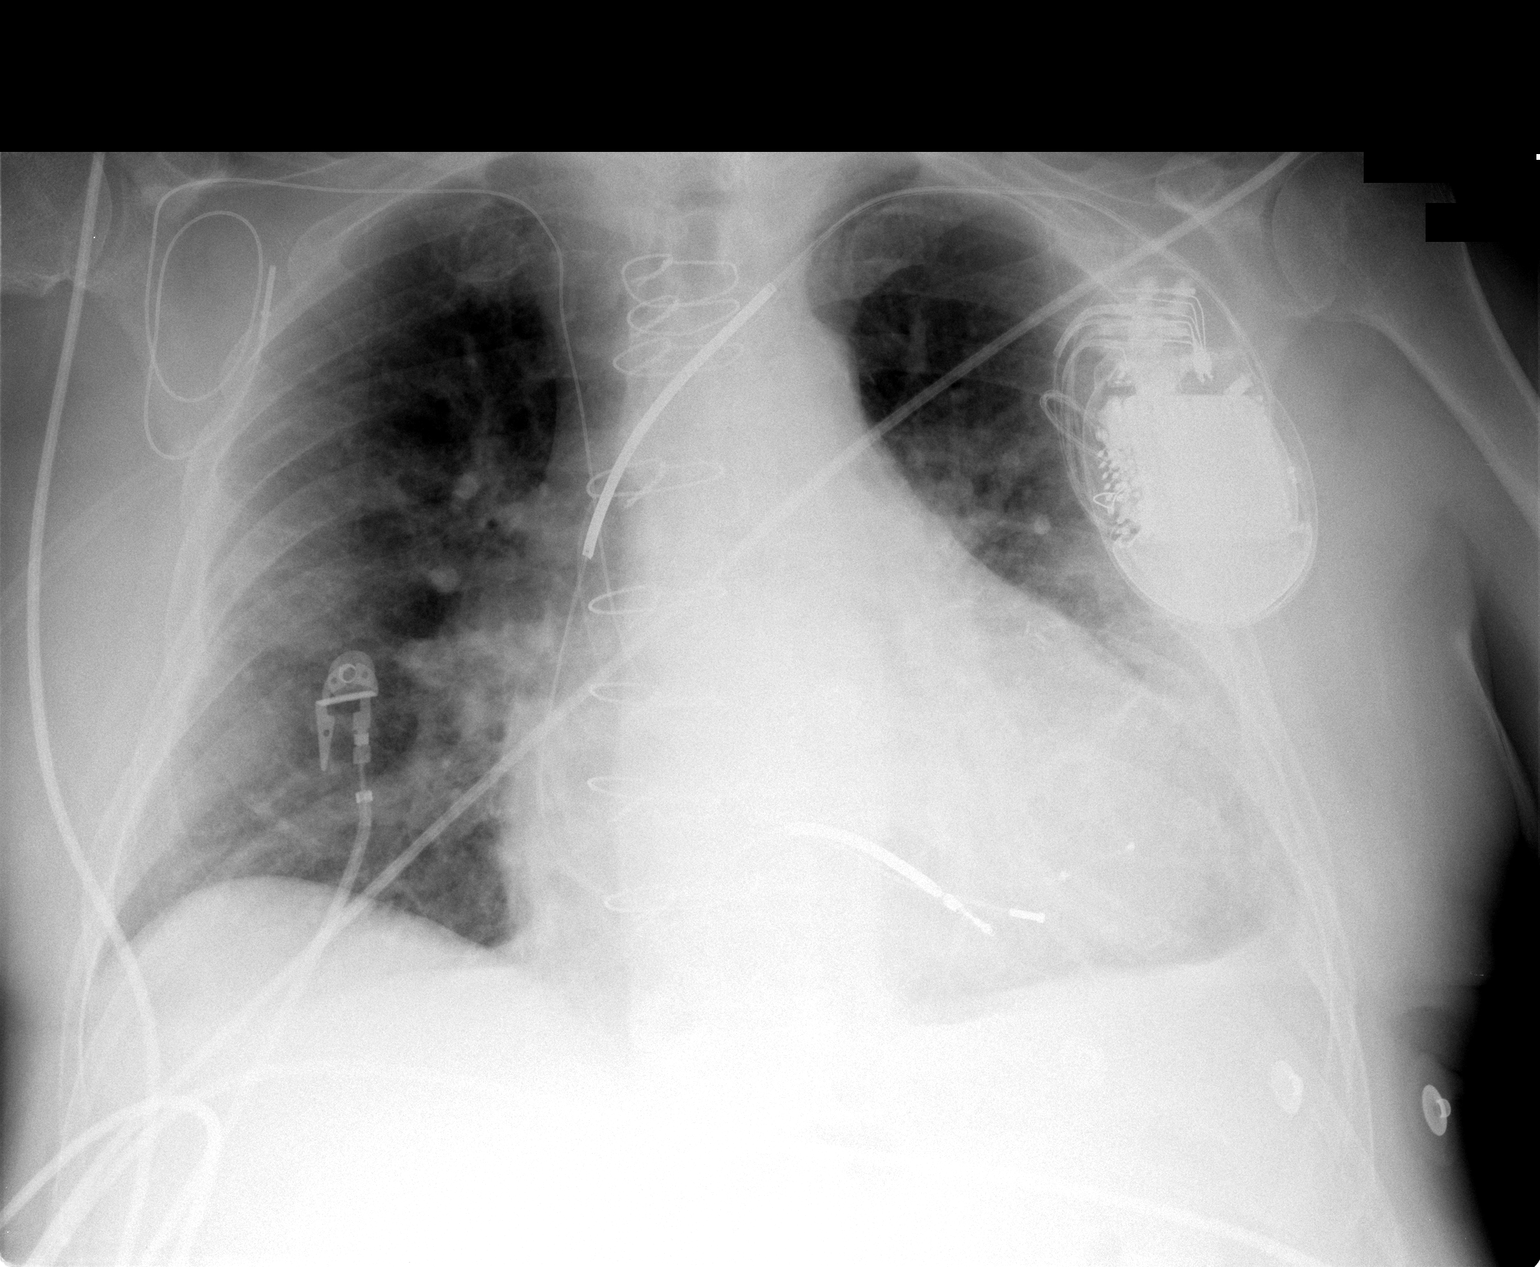

[1 of 1 positions shown; findings below may reference images not displayed]

FINDINGS: There is cardiomegaly and interstitial pulmonary edema.  Chronic blunting of the left costophrenic angle with pleural thickening is again seen.  AICD remains in place.  The patient is status-post CABG.
IMPRESSION: 1.  Cardiomegaly and interstitial pulmonary edema.  
 2.  Chronic scarring, left lung base.

## 2008-07-04 IMAGING — CT CT ANGIO CHEST
2 of 5 series · 18 of 36 positions shown · IV contrast (APPLIED)
Comparison: 04/29/2006

CLINICAL DATA: Shortness of breath.

CT ANGIOGRAPHY OF CHEST
TECHNIQUE: Multidetector CT imaging of the chest was performed during bolus
injection of intravenous contrast.  Multiplanar CT angiographic image
reconstructions were generated to evaluate the vascular anatomy.
Contrast:  80 cc Omnipaque 300

[Series 7: pulm embolism 1.0 b25f thins · axial · 0.67mm/px · z∈[-268,-40]mm · 15 of 260 slices shown]
[im 16/260  lung]
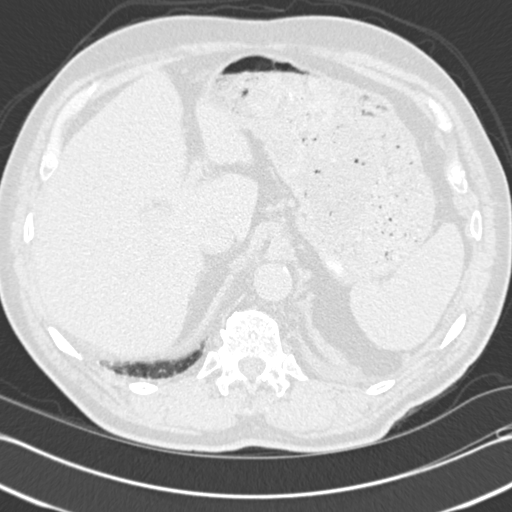
[im 31/260  mediastinal]
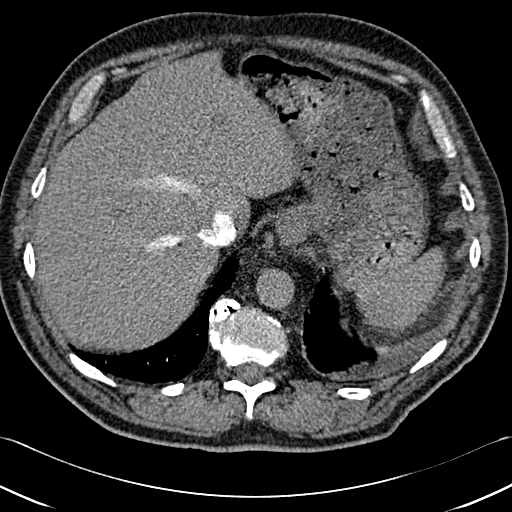
[im 46/260  lung]
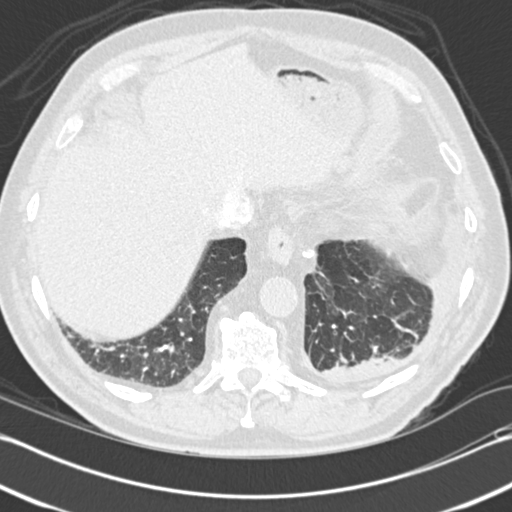
[im 61/260  mediastinal]
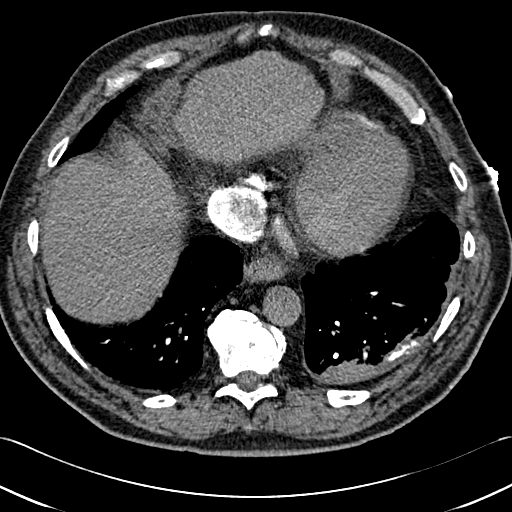
[im 77/260  lung]
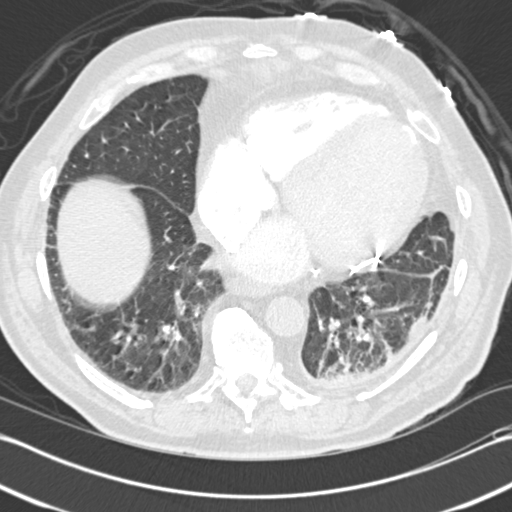
[im 92/260  mediastinal]
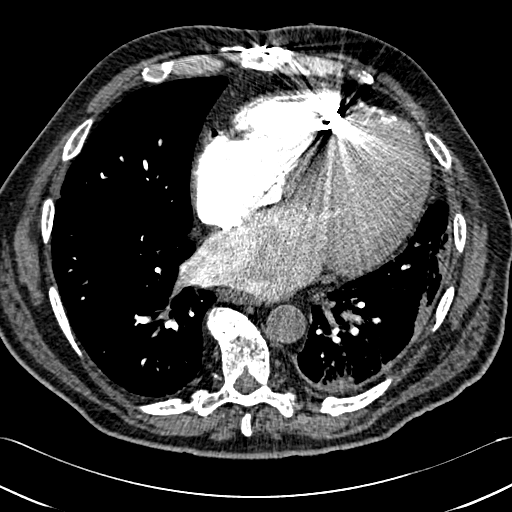
[im 107/260  lung]
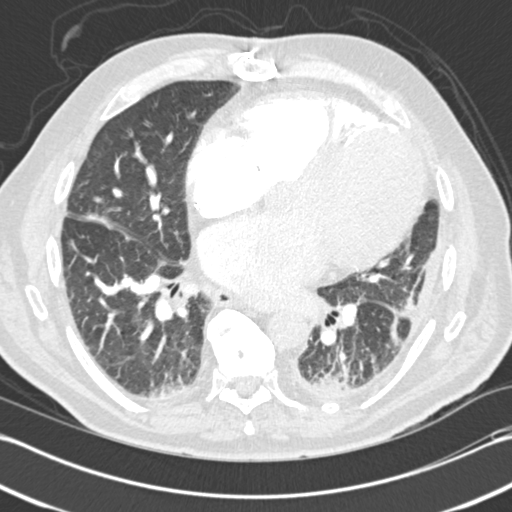
[im 138/260  mediastinal]
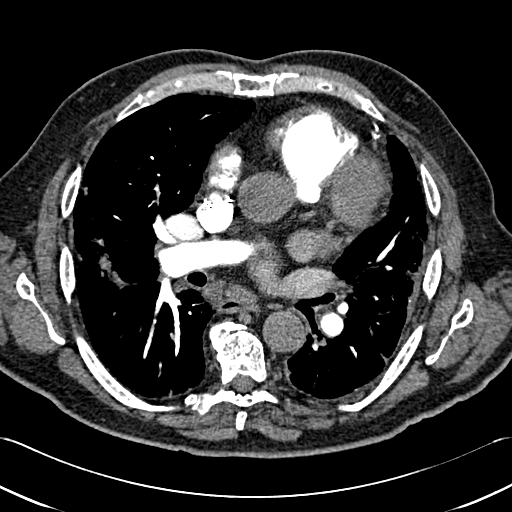
[im 153/260  lung]
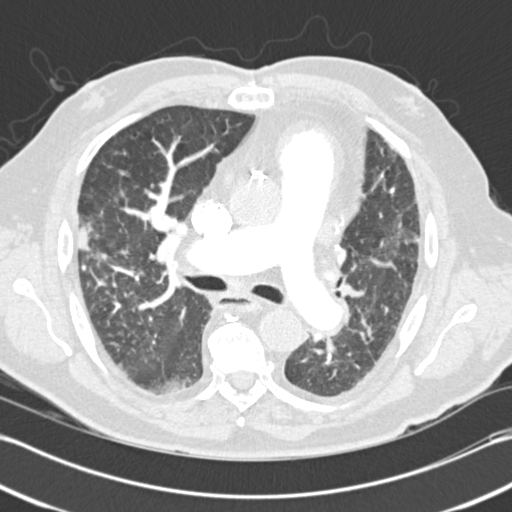
[im 168/260  mediastinal]
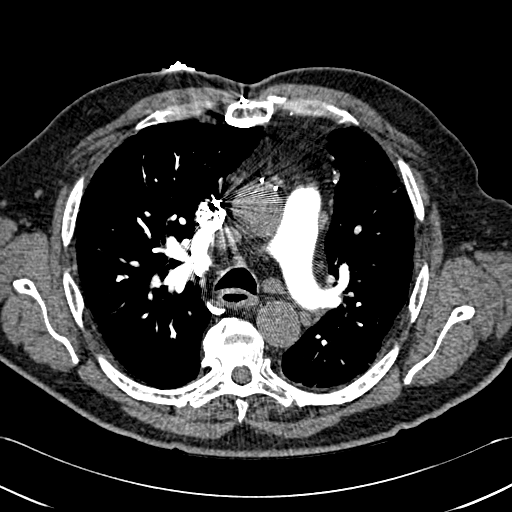
[im 183/260  lung]
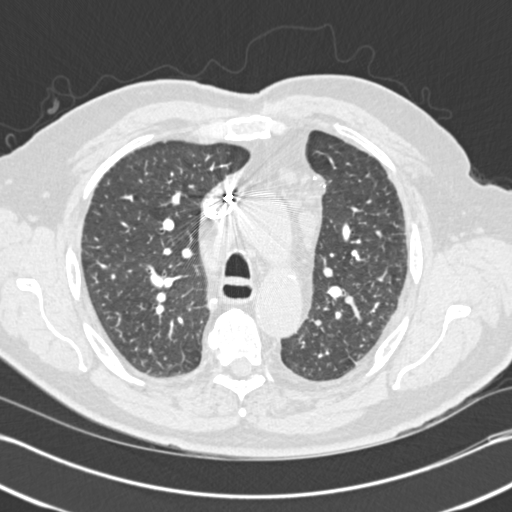
[im 199/260  mediastinal]
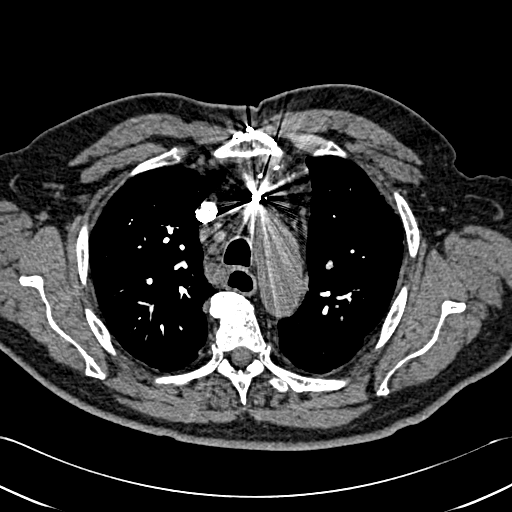
[im 214/260  lung]
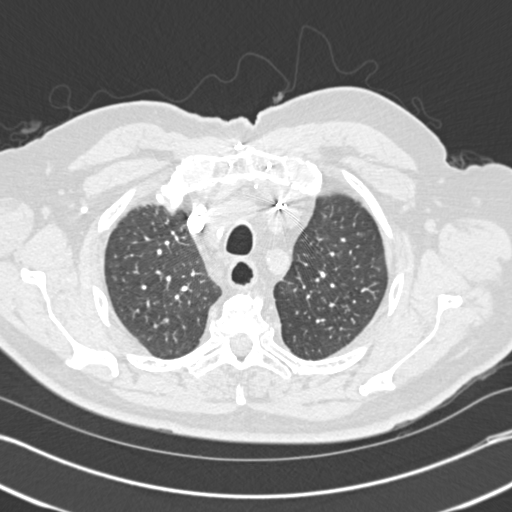
[im 229/260  mediastinal]
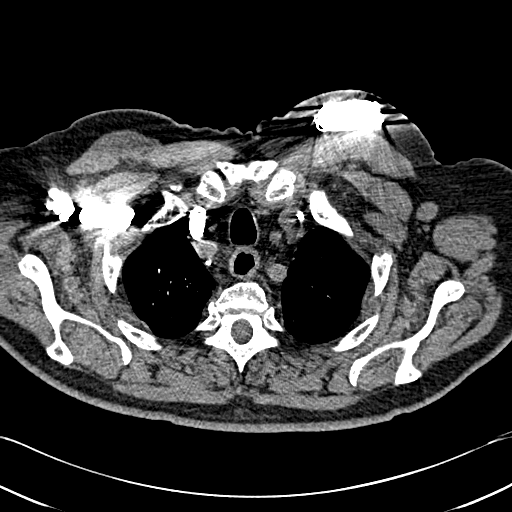
[im 244/260  lung]
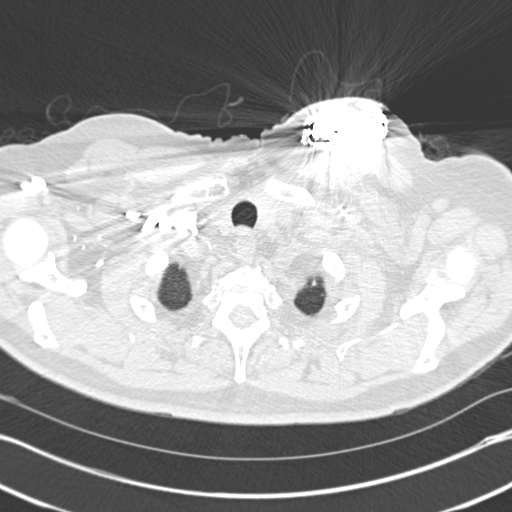

[Series 8: pulm embolism 2.0 spo cor thins · coronal · 0.67mm/px · 3 of 124 slices shown]
[im 25/124  mediastinal]
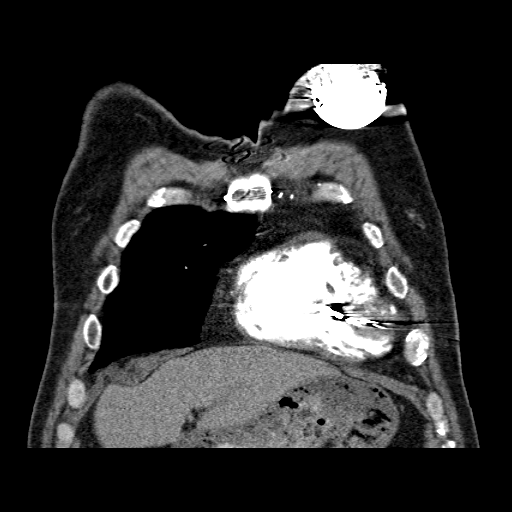
[im 50/124  mediastinal]
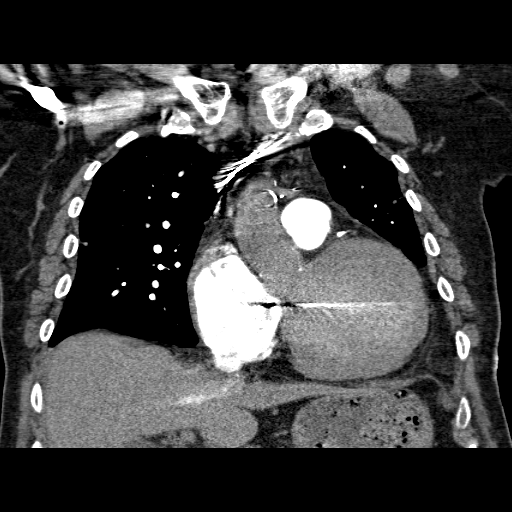
[im 74/124  mediastinal]
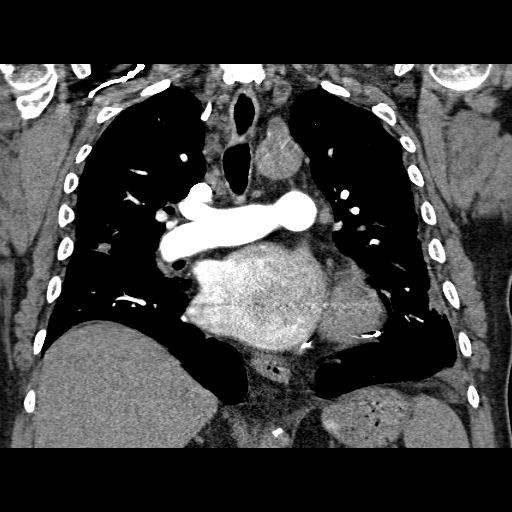

[18 of 36 positions shown; findings below may reference images not displayed]

FINDINGS: There is no abnormal filling defects within the main pulmonary artery
or its branches to suggest acute pulmonary embolus.

Negative for pericardial or pleural fluid.

There is marked multichamber cardiac enlargement.

No enlarged axillary, or supraclavicular adenopathy.

Prominent hilar and mediastinal lymph nodes are again noted. 
 For example, precarinal lymph node measures 11.3 x 16.5 mm this is similar to
previous exam.
 AP window lymph node measures 16.3 x 7.4 mm, image 43 this is also unchanged
compared with prior exam. 
 Larger right paratracheal lymph node measures 14.9 x 11.4 mm also similar to
prior exam.

There is interlobular septal thickening at the lung bases consistent with
pulmonary edema.

Diffuse reticular and nodular densities throughout both lungs consistent with
chronic interstitial lung disease. 

At the left lung base there is a partially calcified pleural placed plaque with
adjacent scarring. This is unchanged from prior exam.

Subpleural density with any right middle lobe, images 52 through 73 is unchanged
from prior exam.

Review of the bone window shows multilevel thoracic spondylosis. 

Postoperative changes from a median sternotomy are noted.

Chronic left posterior rib fractures are again noted.

IMPRESSION

1. No evidence for acute pulmonary embolus.
2. Stable mediastinal and hilar lymphadenopathy. The differential diagnosis
includes granulomatous inflammation or infection, reactive adenopathy or
malignancy.
3. Marked multichamber cardiac enlargement and mild pulmonary edema. 
4. Stable chronic interstitial lung disease and pleural thickening

## 2008-07-22 ENCOUNTER — Observation Stay (HOSPITAL_COMMUNITY): Admission: EM | Admit: 2008-07-22 | Discharge: 2008-07-24 | Payer: Self-pay | Admitting: Emergency Medicine

## 2008-08-18 IMAGING — CR DG CHEST 2V
2 series · 2 of 2 positions shown · non-contrast
Comparison: none

CLINICAL DATA: Shortness of breath, cough

[w chest pa]
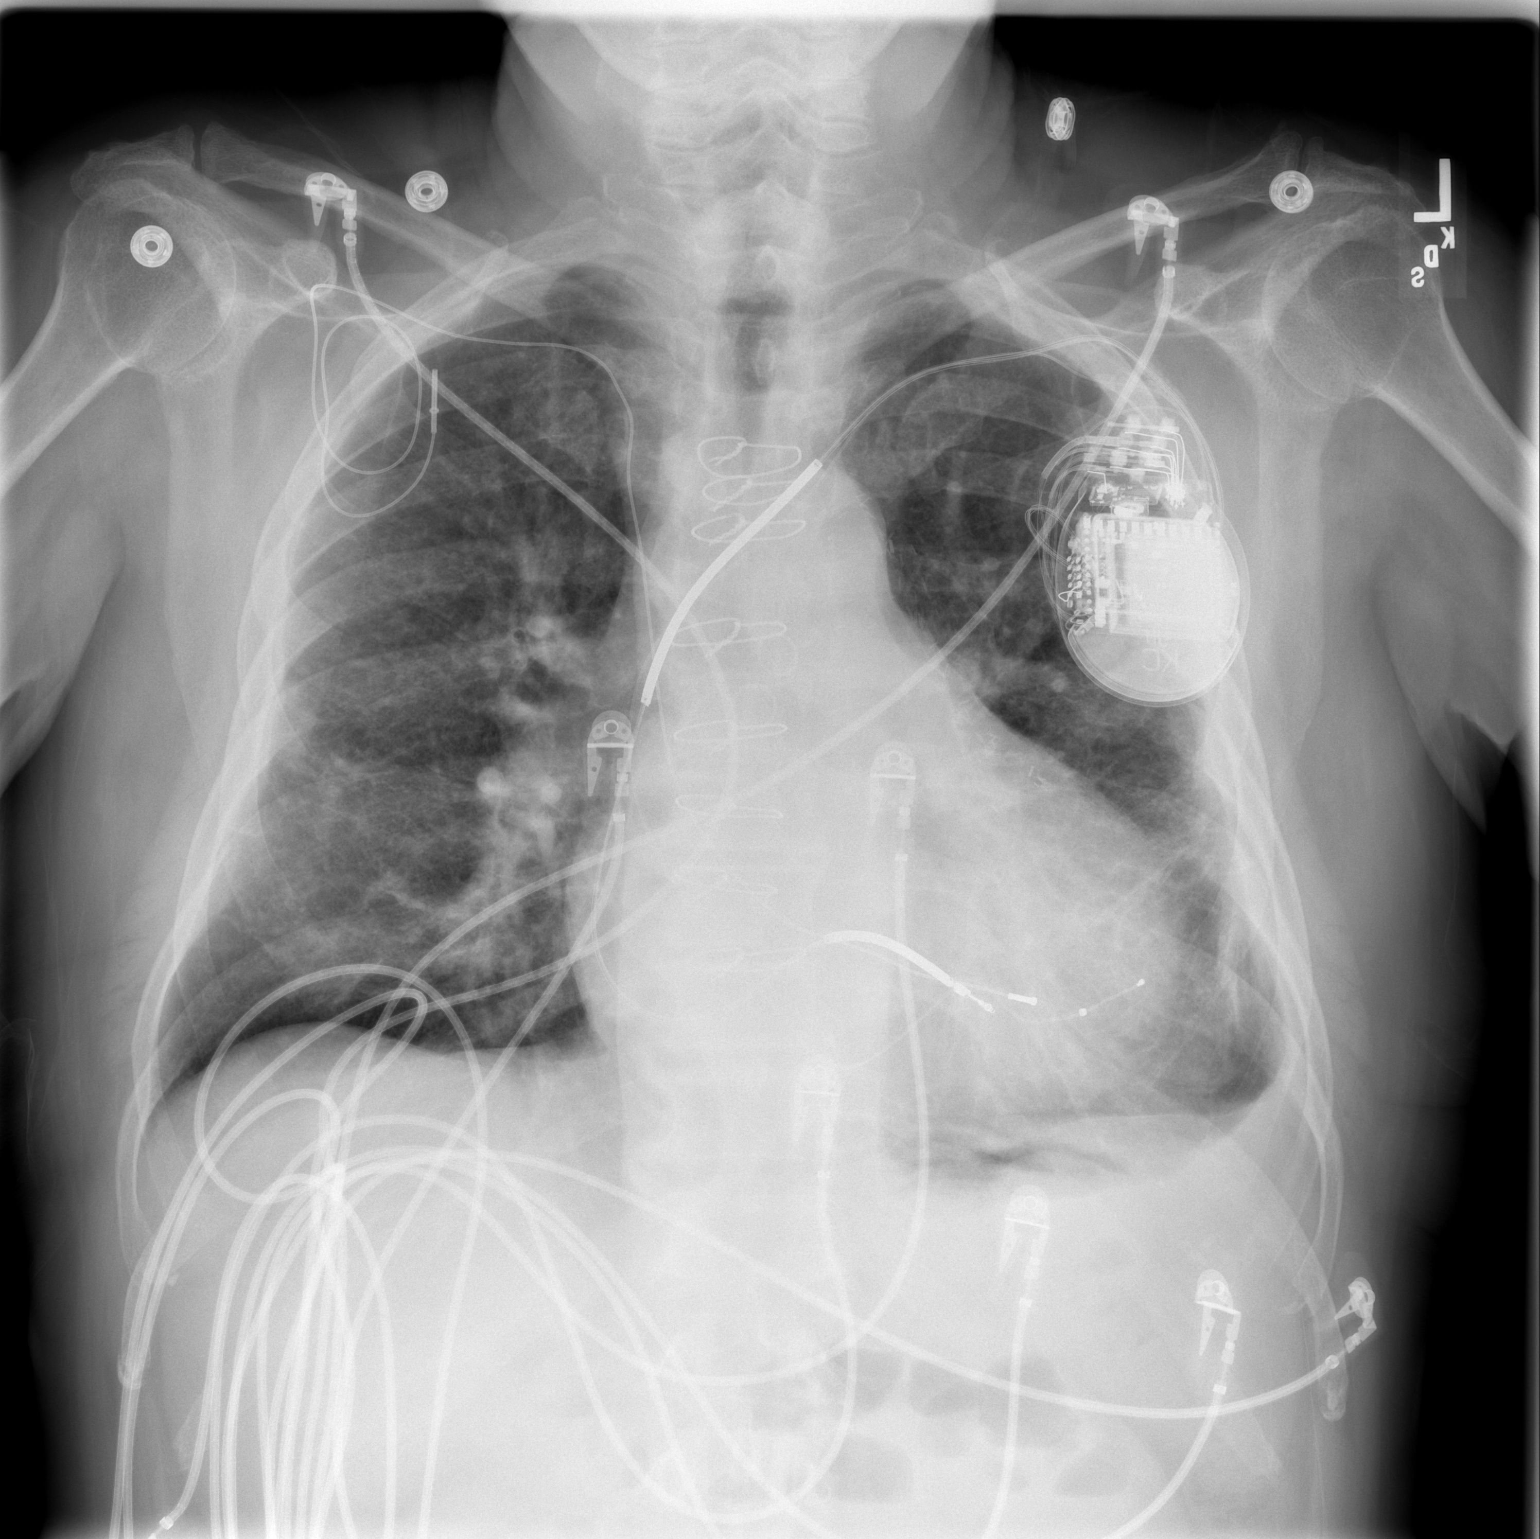

[w chest lat]
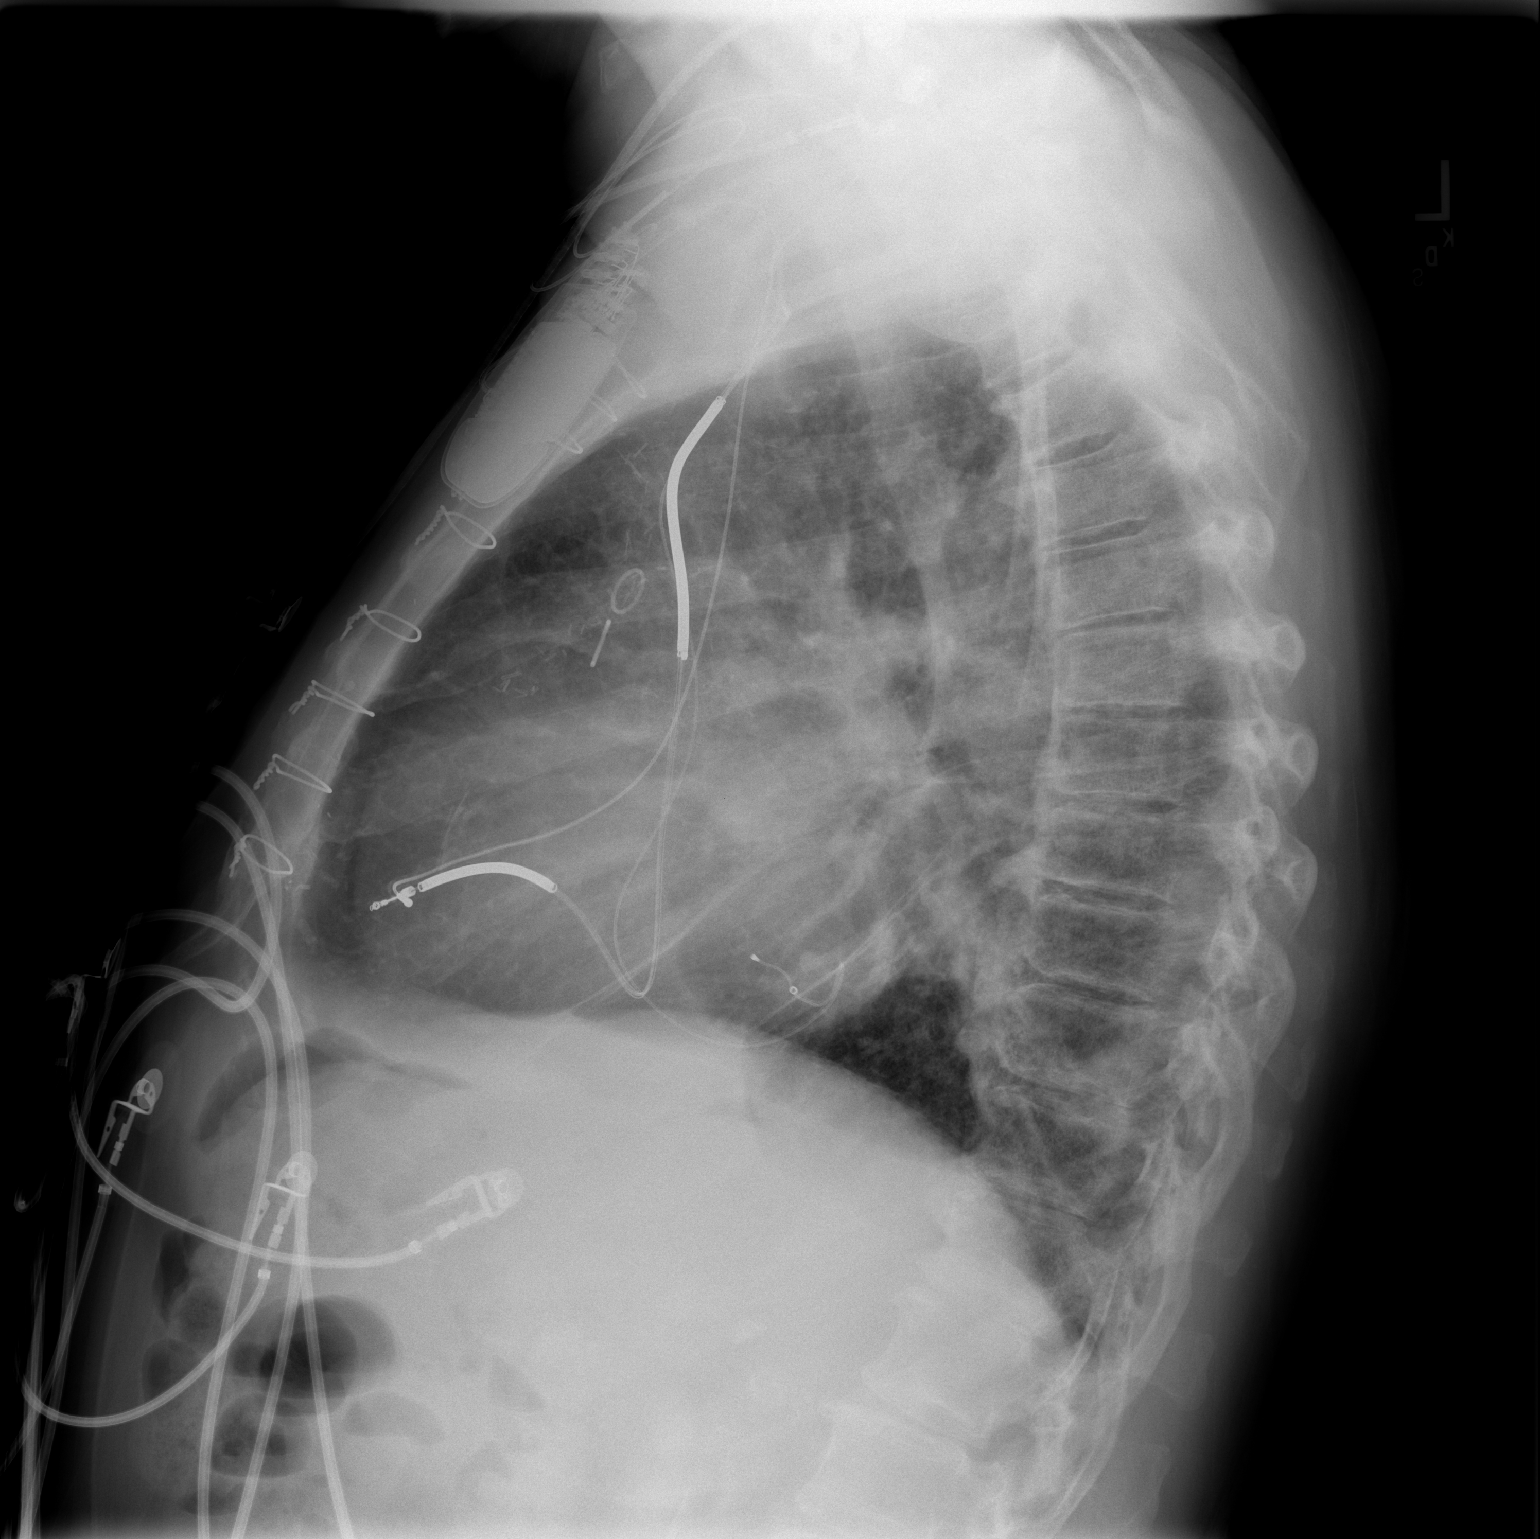

[2 of 2 positions shown; findings below may reference images not displayed]

Chest 2 view:

Comparison 12/03/2006. Left subclavian AICD and disconnected right subclavian
pacing wire stable. Prior CABG. Mild cardiomegaly. Left lateral costophrenic
angle blunting and pleural thickening. Coarse interstitial opacities
peripherally in both lungs largely stable compared to films dating back to
04/29/2006. No overt interstitial edema.
IMPRESSION: 1. Stable chronic and postoperative changes.
2. Stable mild cardiomegaly

## 2008-08-26 IMAGING — CR DG CHEST 1V PORT
1 series · 1 of 1 positions shown · non-contrast
Comparison: none

HISTORY: Chest pain

PORTABLE CHEST ONE VIEW:
Portable exam 5559 hours compared to eliminate [id number]
Cardiac enlargement status post CABG.
Pacemaker and AICD leads stable.
Mild pulmonary vascular congestion.
Mild chronic accentuation of markings without infiltrate or failure.
No pneumothorax.

[AP]
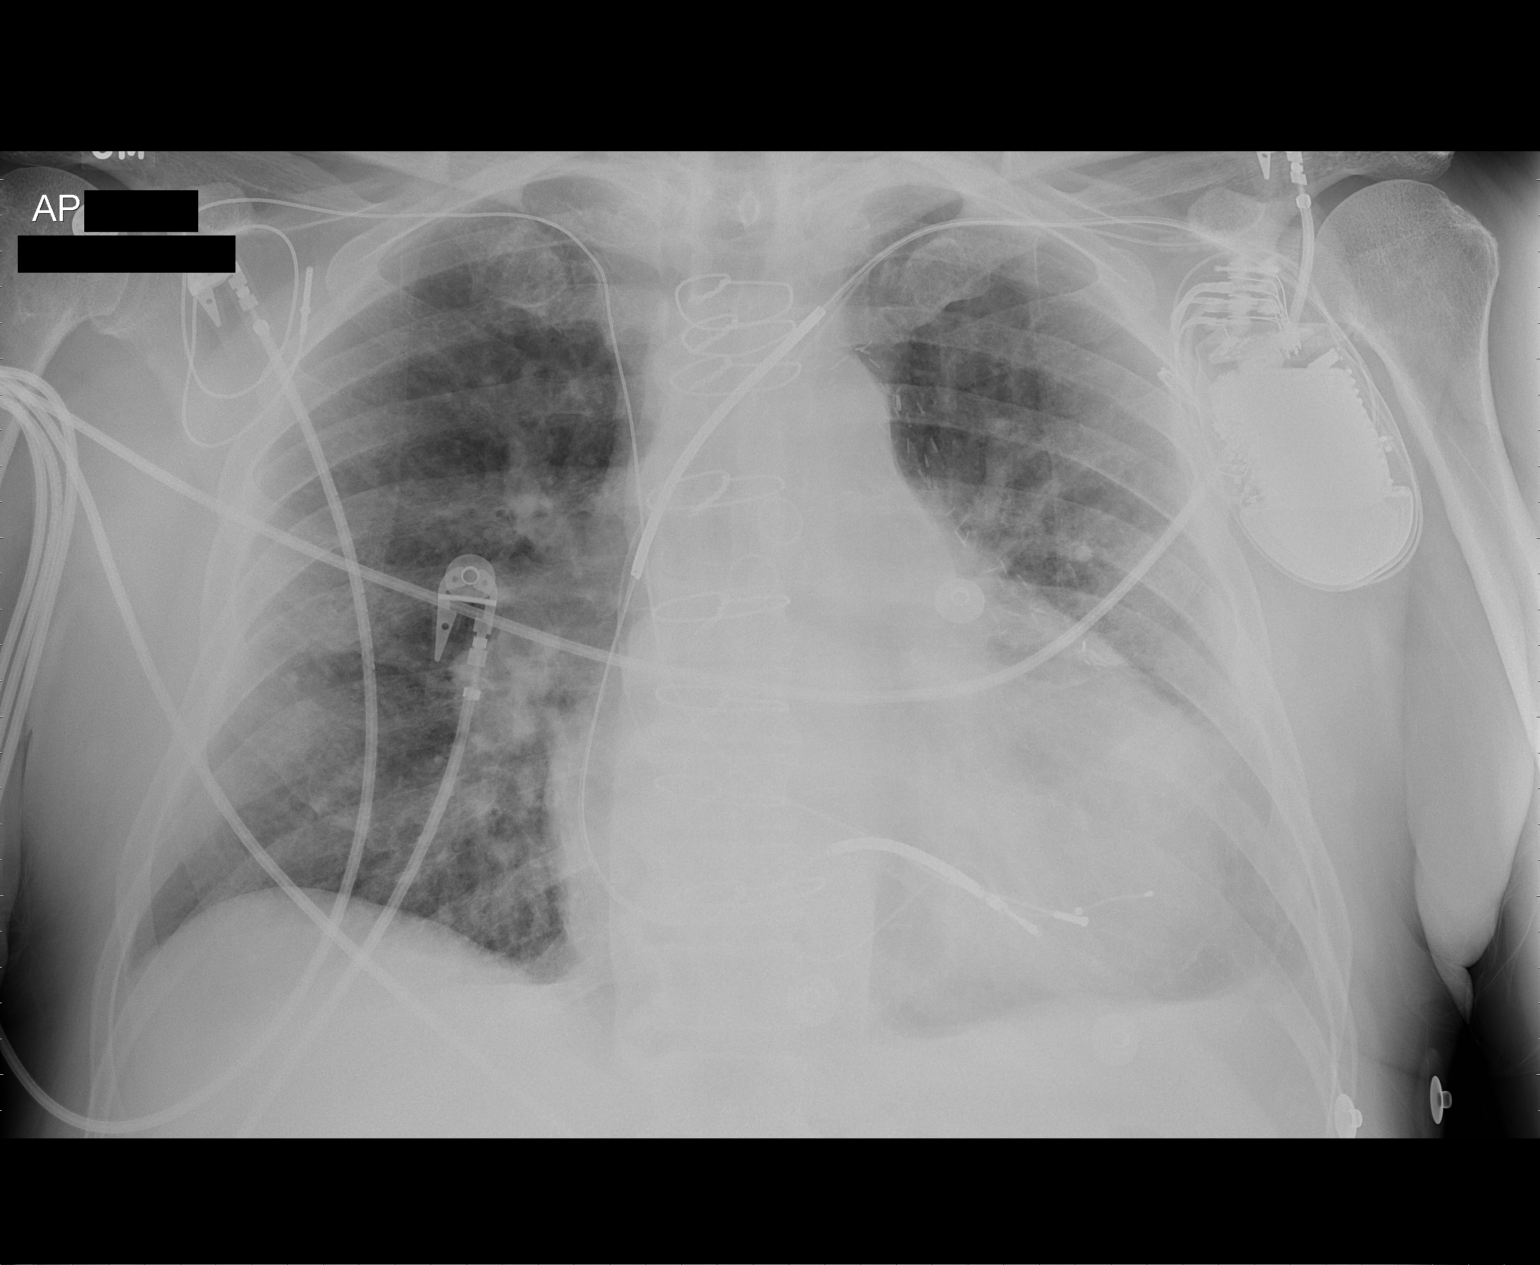

[1 of 1 positions shown; findings below may reference images not displayed]

IMPRESSION: Cardiomegaly with pulmonary vascular congestion status post CABG, pacemaker and
AICD.
No acute abnormalities.

## 2008-09-13 ENCOUNTER — Encounter: Payer: Self-pay | Admitting: Internal Medicine

## 2008-10-04 ENCOUNTER — Encounter: Payer: Self-pay | Admitting: Internal Medicine

## 2008-10-08 ENCOUNTER — Telehealth (INDEPENDENT_AMBULATORY_CARE_PROVIDER_SITE_OTHER): Payer: Self-pay | Admitting: *Deleted

## 2008-10-09 ENCOUNTER — Encounter: Payer: Self-pay | Admitting: Internal Medicine

## 2008-10-11 ENCOUNTER — Ambulatory Visit: Payer: Self-pay | Admitting: Internal Medicine

## 2008-10-15 ENCOUNTER — Encounter: Payer: Self-pay | Admitting: Internal Medicine

## 2008-10-29 ENCOUNTER — Observation Stay (HOSPITAL_COMMUNITY): Admission: EM | Admit: 2008-10-29 | Discharge: 2008-10-30 | Payer: Self-pay | Admitting: Emergency Medicine

## 2009-01-10 ENCOUNTER — Ambulatory Visit: Payer: Self-pay | Admitting: Internal Medicine

## 2009-01-19 ENCOUNTER — Encounter: Payer: Self-pay | Admitting: Internal Medicine

## 2009-02-23 IMAGING — CR DG SHOULDER 2+V*R*
3 series · 3 of 3 positions shown · non-contrast
Comparison: None

CLINICAL DATA: Right shoulder pain

RIGHT SHOULDER - 2+ VIEW

[w shoulder ap internal righ]
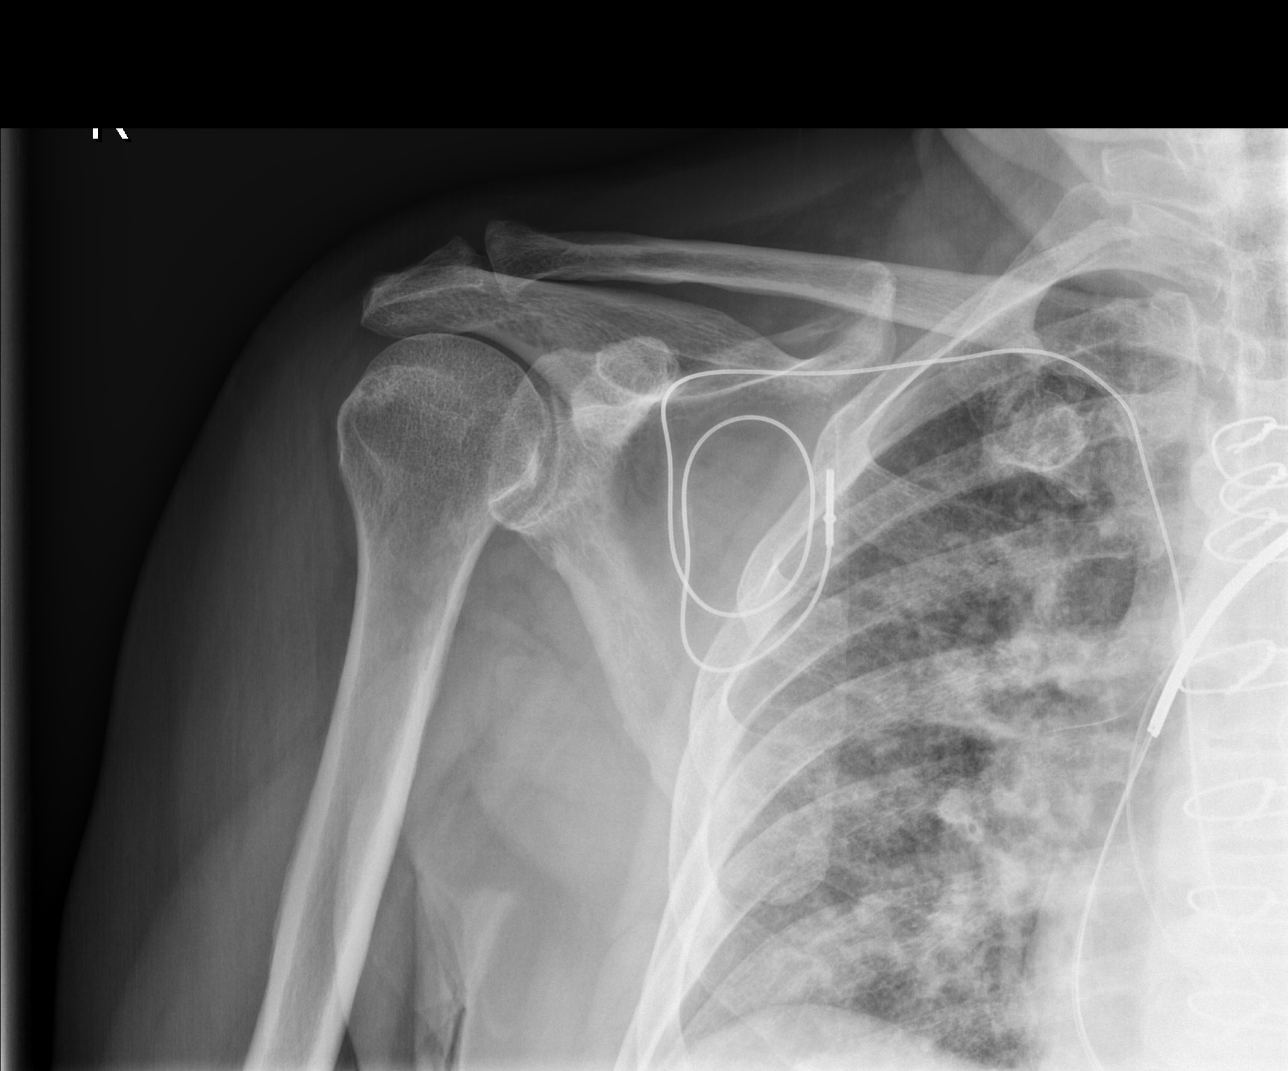

[w shoulder ap external righ]
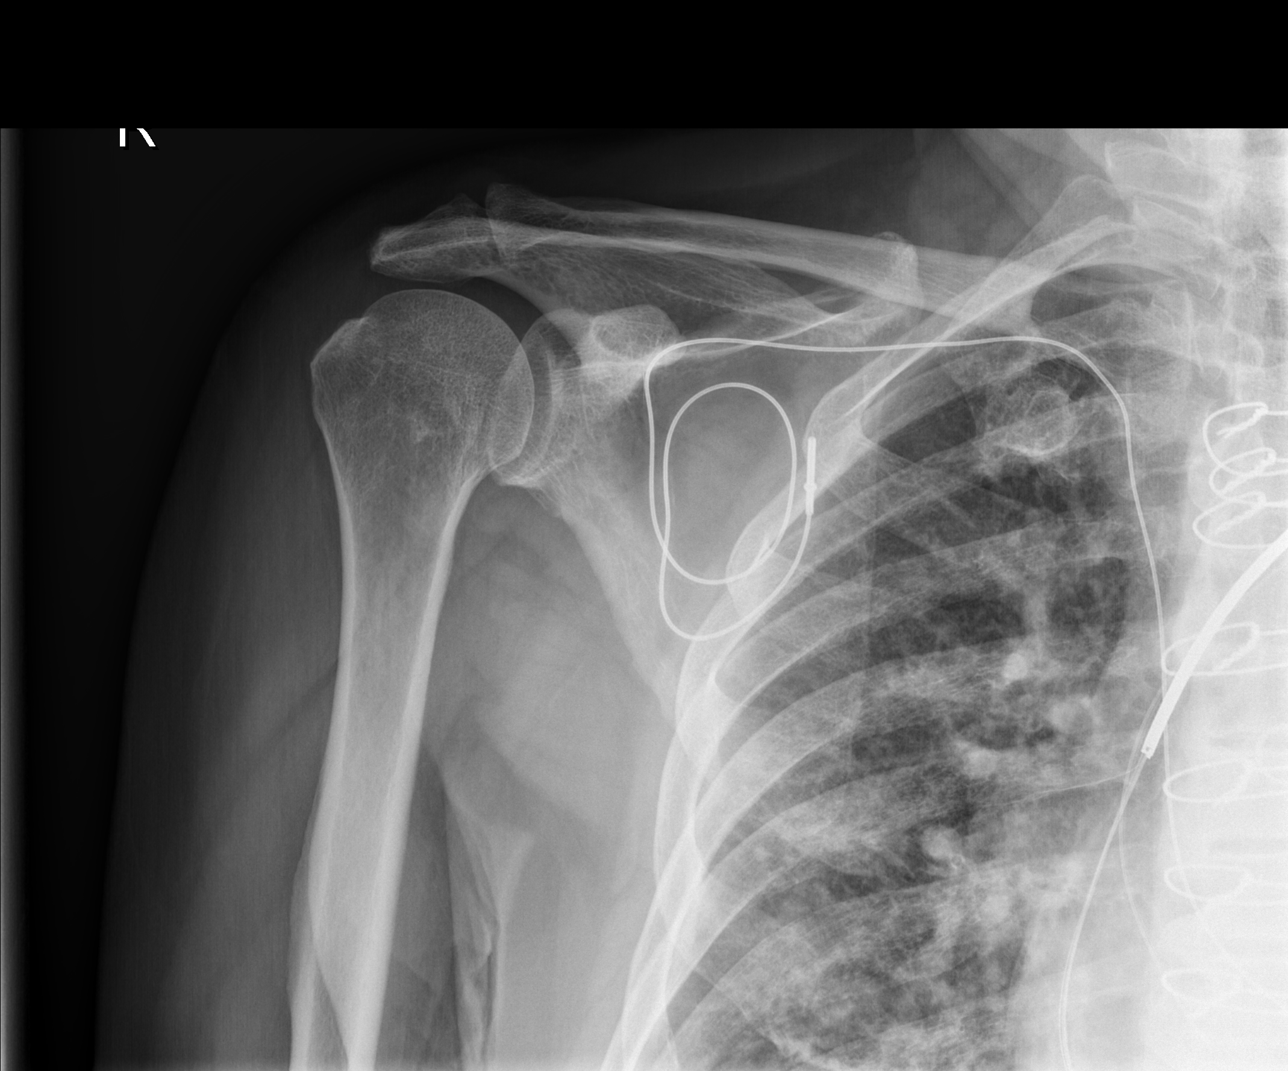

[x shoulder axillary right]
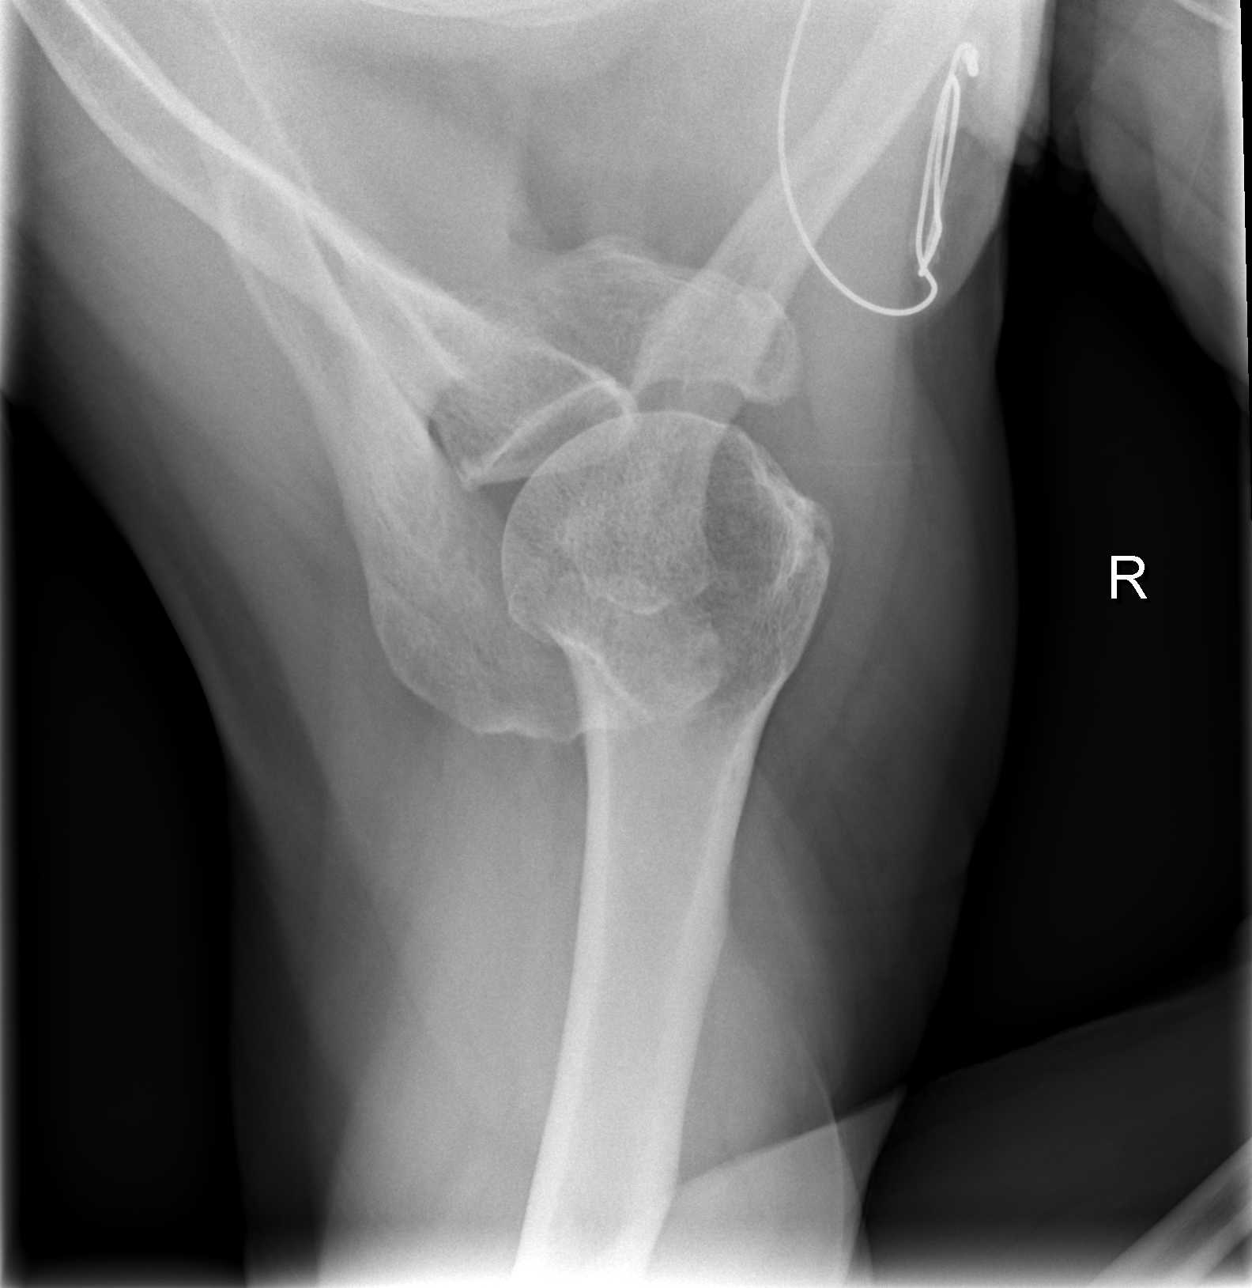

[3 of 3 positions shown; findings below may reference images not displayed]

FINDINGS: Mild degenerative changes present at the glenohumeral
joint and acromioclavicular joint.  No acute fracture or
dislocation.  No bony lesions are identified.
IMPRESSION: No acute findings.  Degenerative changes as above.

## 2009-02-27 ENCOUNTER — Emergency Department (HOSPITAL_COMMUNITY): Admission: EM | Admit: 2009-02-27 | Discharge: 2009-02-27 | Payer: Self-pay | Admitting: Emergency Medicine

## 2009-04-27 ENCOUNTER — Emergency Department (HOSPITAL_COMMUNITY): Admission: EM | Admit: 2009-04-27 | Discharge: 2009-04-28 | Payer: Self-pay | Admitting: Emergency Medicine

## 2009-05-24 ENCOUNTER — Ambulatory Visit: Payer: Self-pay | Admitting: Internal Medicine

## 2009-05-24 DIAGNOSIS — Z95810 Presence of automatic (implantable) cardiac defibrillator: Secondary | ICD-10-CM

## 2009-08-26 ENCOUNTER — Encounter: Payer: Self-pay | Admitting: Internal Medicine

## 2009-09-02 ENCOUNTER — Ambulatory Visit: Payer: Self-pay | Admitting: Internal Medicine

## 2009-09-21 ENCOUNTER — Encounter: Payer: Self-pay | Admitting: Internal Medicine

## 2009-10-05 IMAGING — CR DG ABDOMEN ACUTE W/ 1V CHEST
3 series · 3 of 3 positions shown · non-contrast
Comparison: Chest x-ray 04/12/2007.

CLINICAL DATA: Abdominal pain and vomiting.  Lung cancer.  Short of
breath.

ACUTE ABDOMEN SERIES (ABDOMEN 2 VIEW & CHEST 1 VIEW)

[w chest pa]
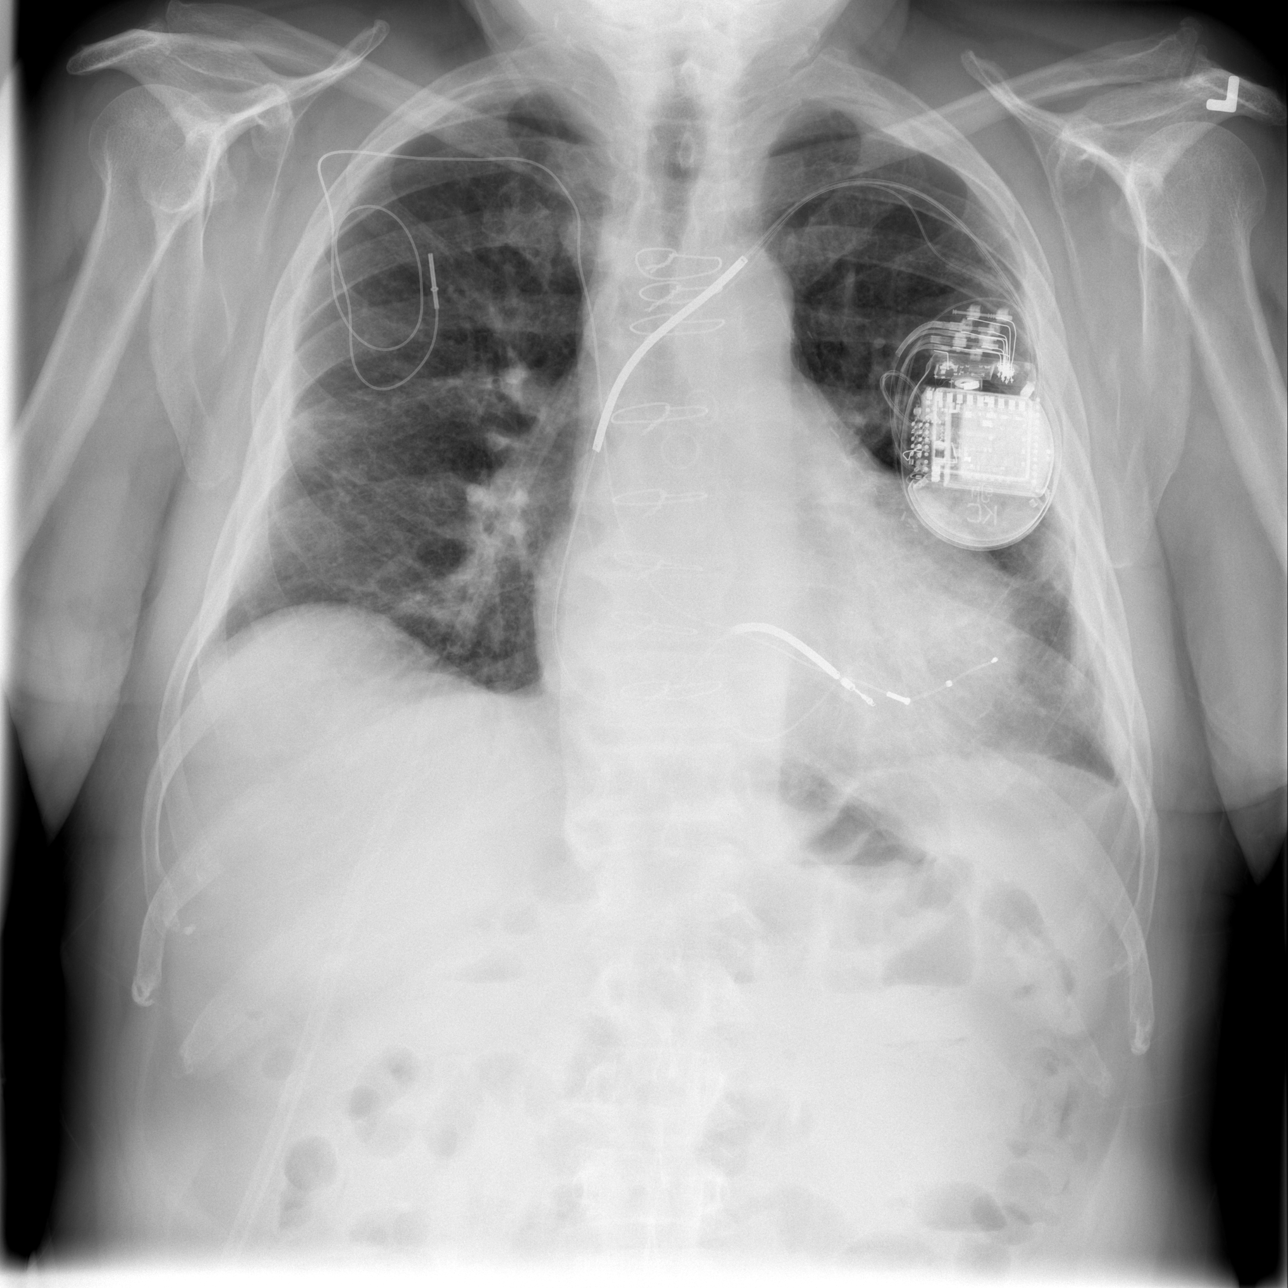

[w abdomen upright]
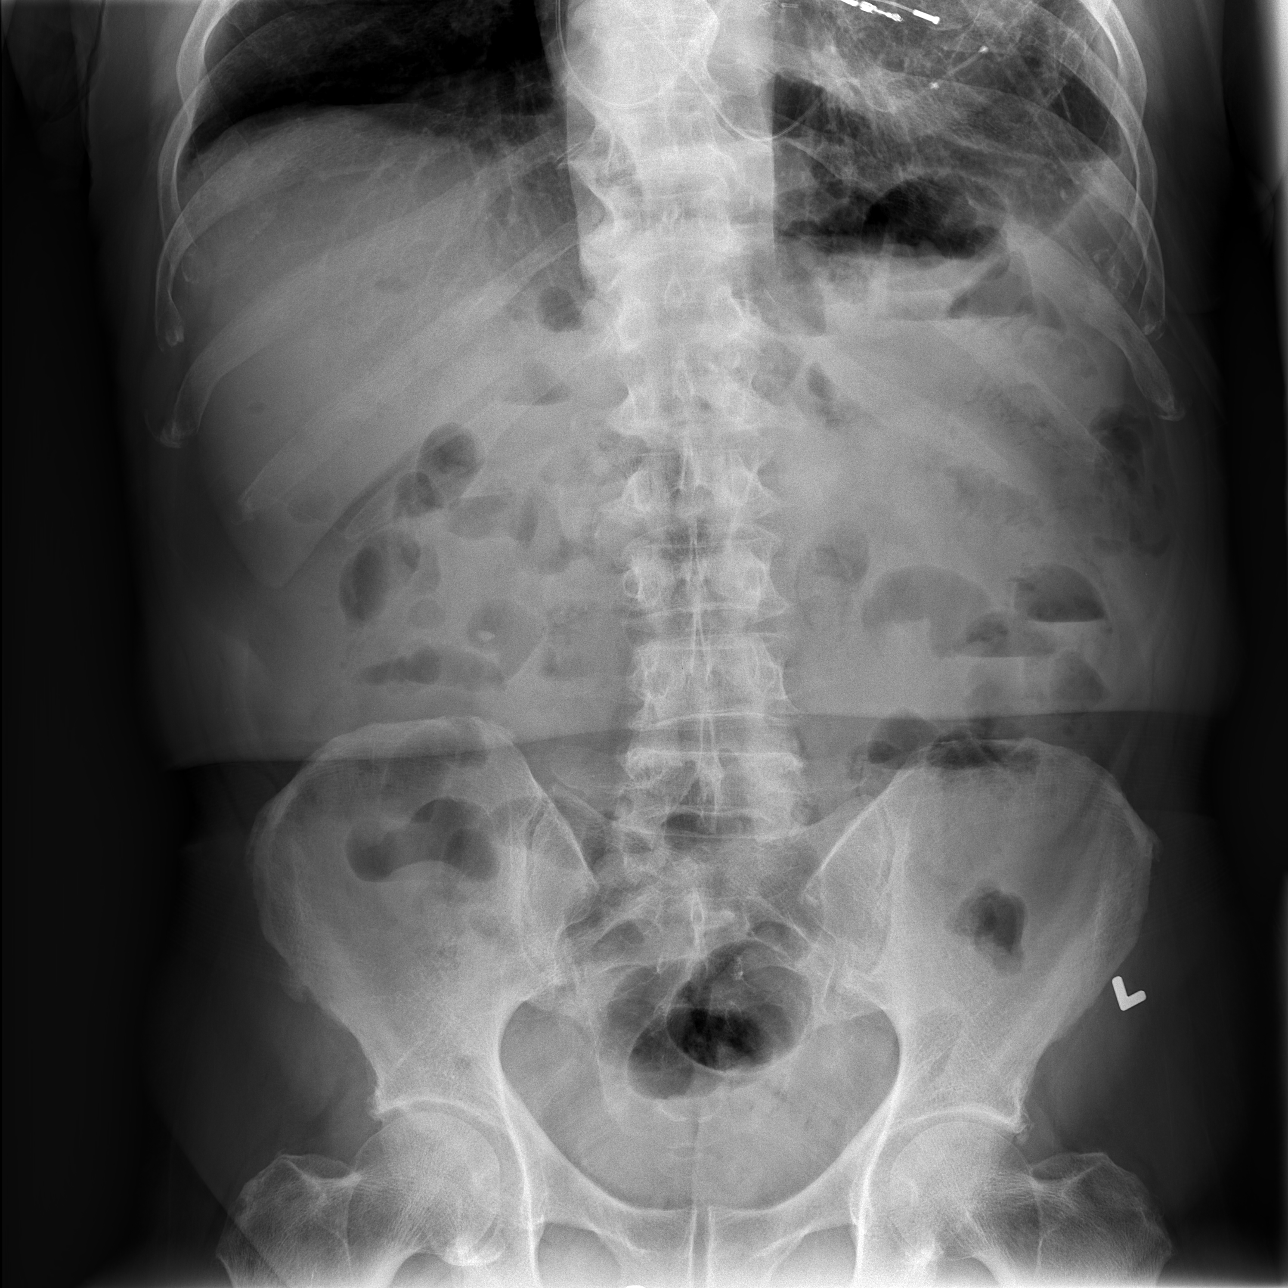

[t abdomen supine]
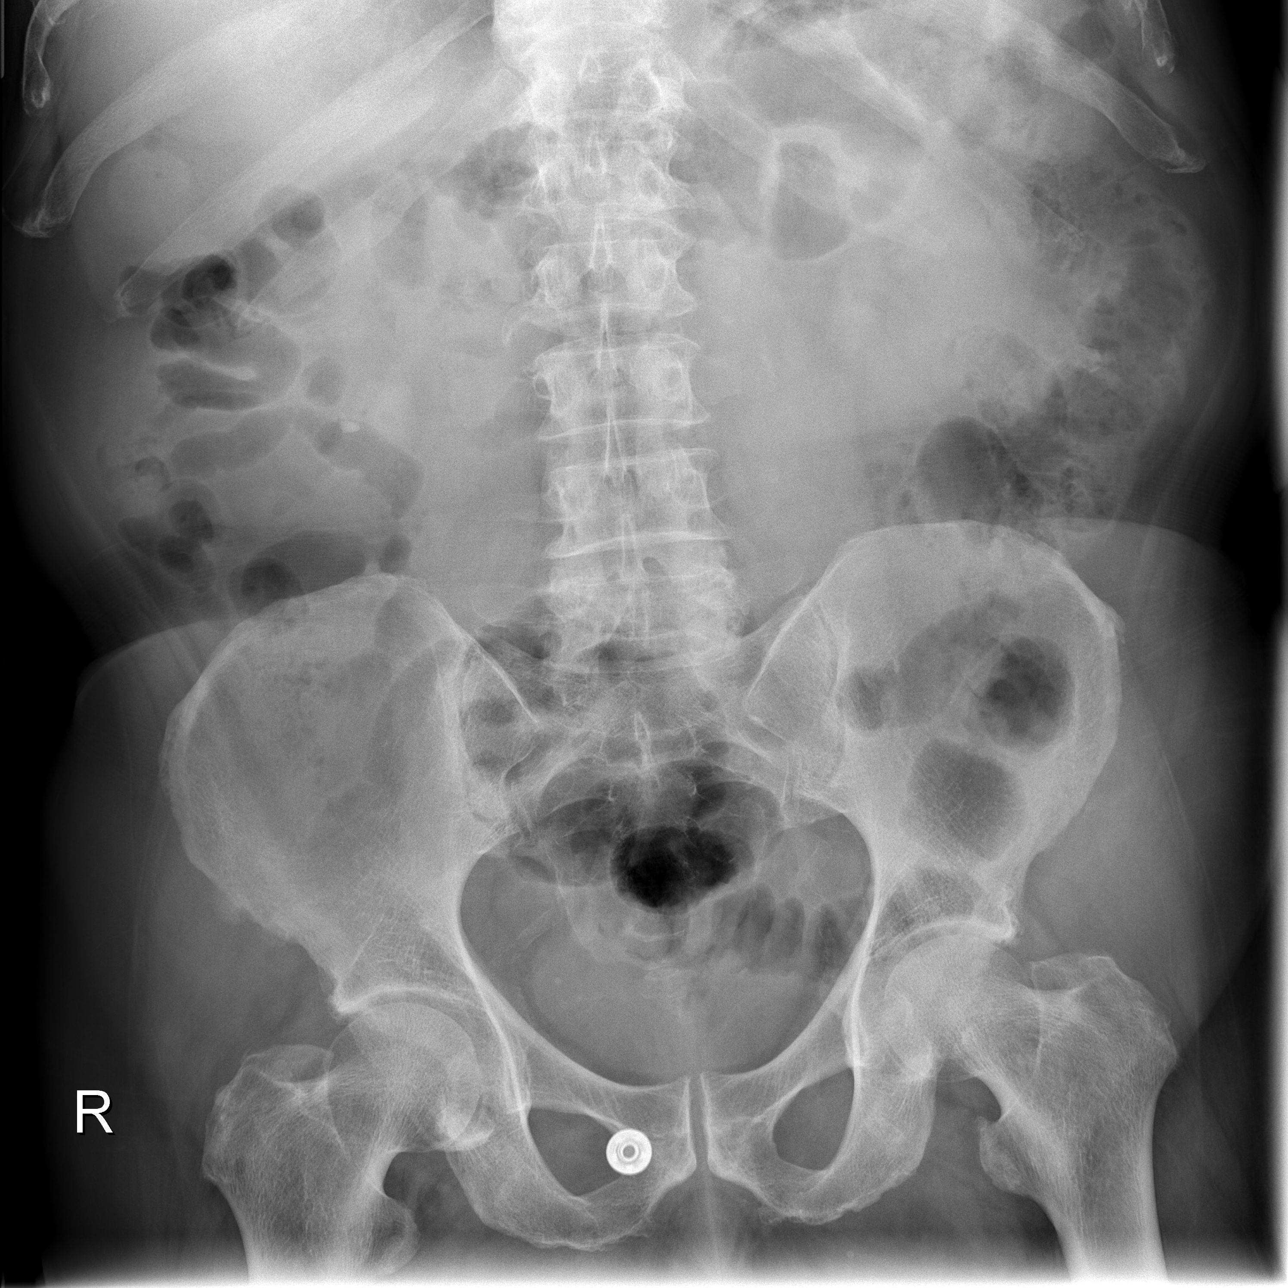

[3 of 3 positions shown; findings below may reference images not displayed]

FINDINGS: AICD is unchanged.  The heart is enlarged and there is no
heart failure.  There is COPD.  There is scarring in the pleural
space on the left which is unchanged.  There is no mass or
infiltrate.

Nondilated small bowel loops with air-fluid levels are noted.
There is gas in the rectum and colon which is nondilated.  There is
no free air.  5 mm right renal calculus is noted.
IMPRESSION: COPD and no active disease in the chest

Probable adynamic ileus in the abdomen.

Right renal calculus, unchanged from prior CT scan.

## 2009-10-05 IMAGING — US US ABDOMEN COMPLETE
1 series · 14 of 25 positions shown · non-contrast
Comparison: 07/30/2005

CLINICAL DATA: Abdominal pain.  Vomiting.  Constipation.

ABDOMEN ULTRASOUND
TECHNIQUE: Complete abdominal ultrasound examination was performed
including evaluation of the liver, gallbladder, bile ducts,
pancreas, kidneys, spleen, IVC, and abdominal aorta.

[Series 1: unknown · 0.28mm/px · 14 of 57 slices shown]
[im 1/57]
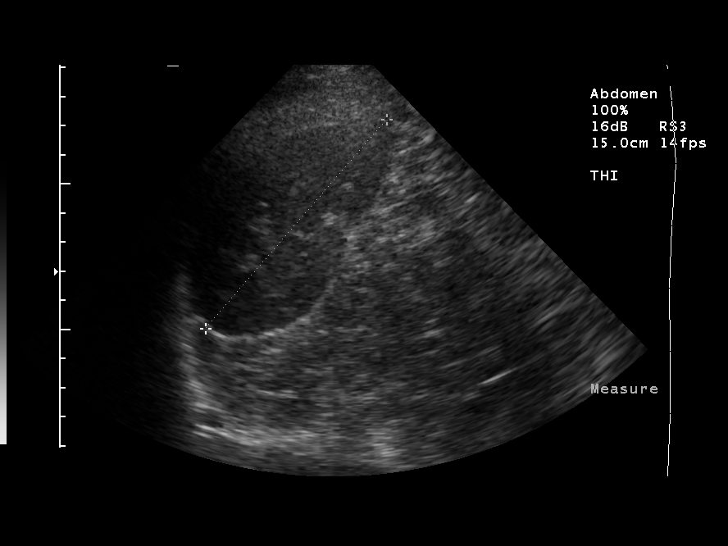
[im 5/57]
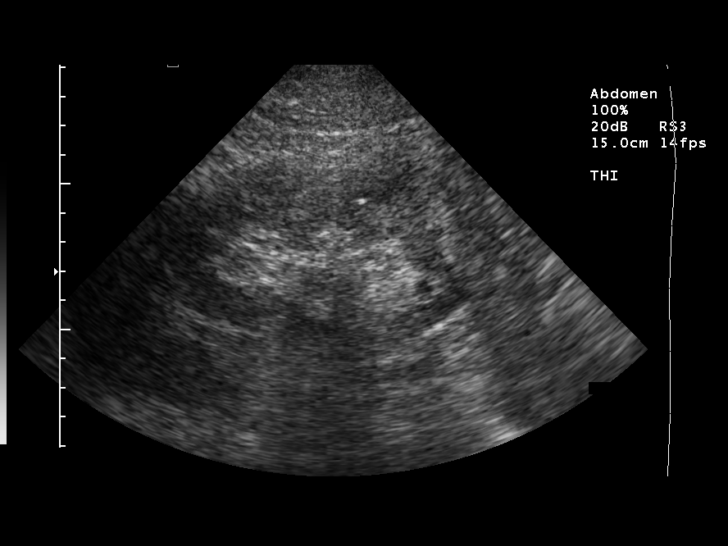
[im 10/57]
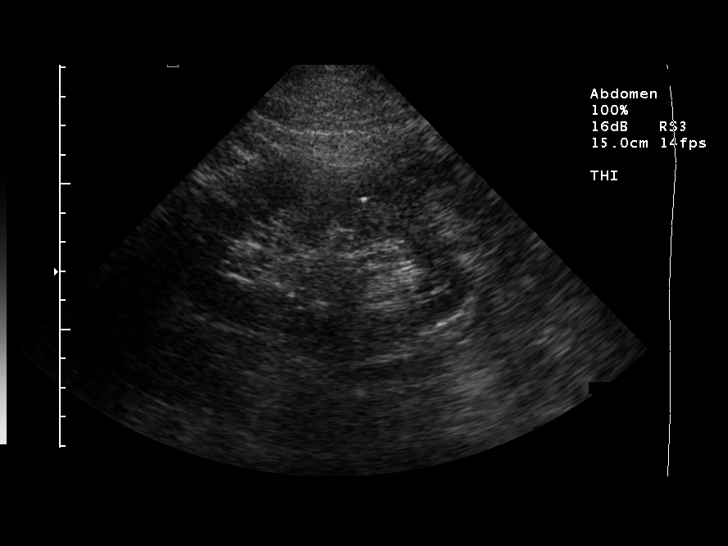
[im 15/57]
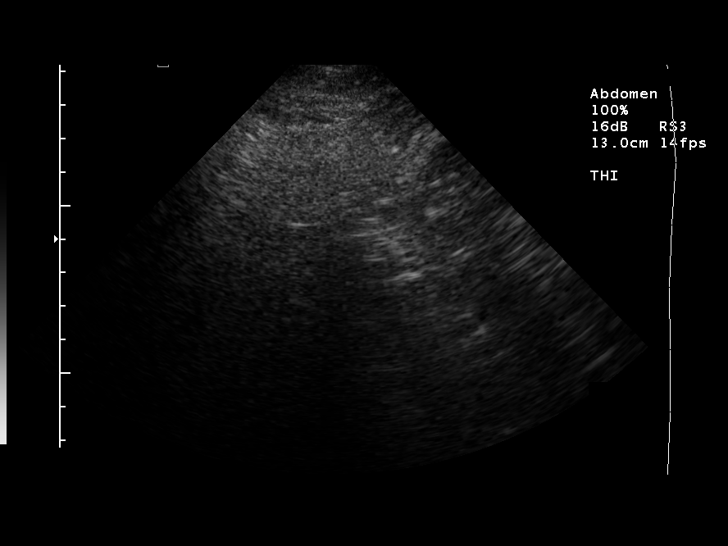
[im 19/57]
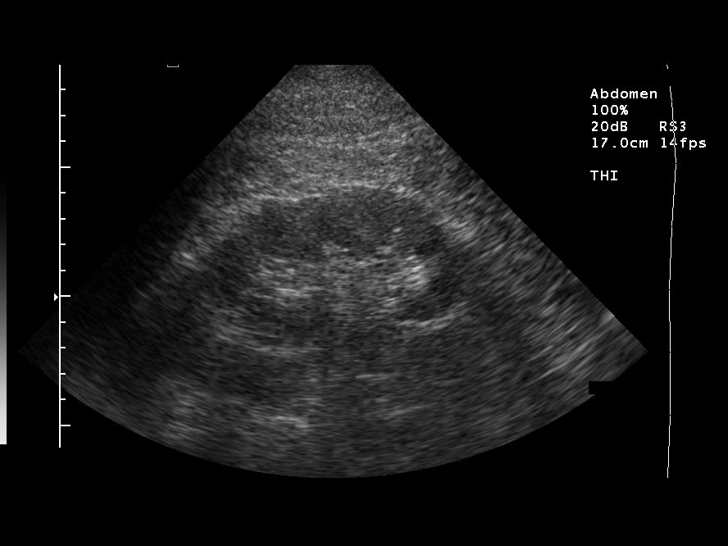
[im 22/57]
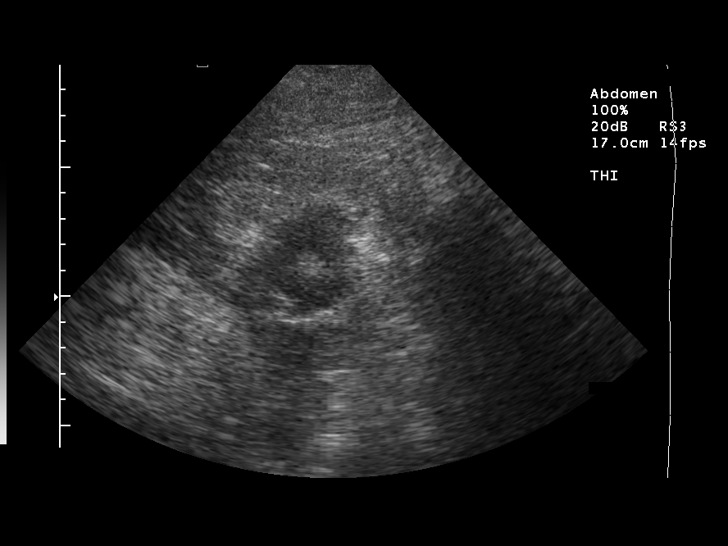
[im 26/57]
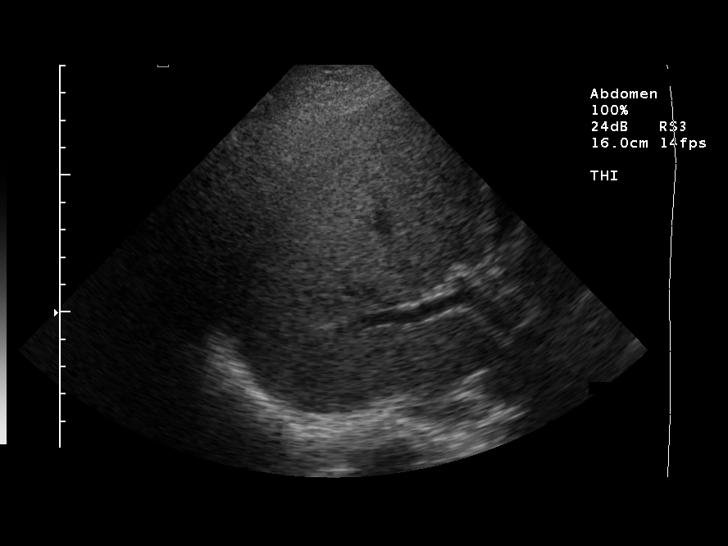
[im 31/57]
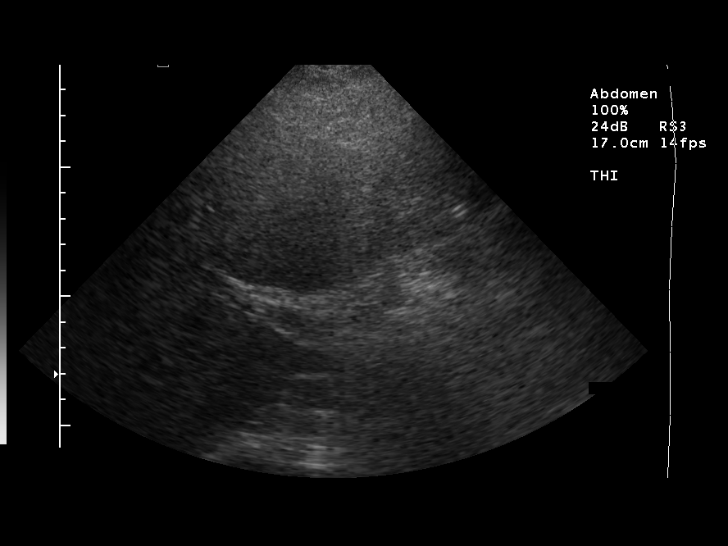
[im 36/57]
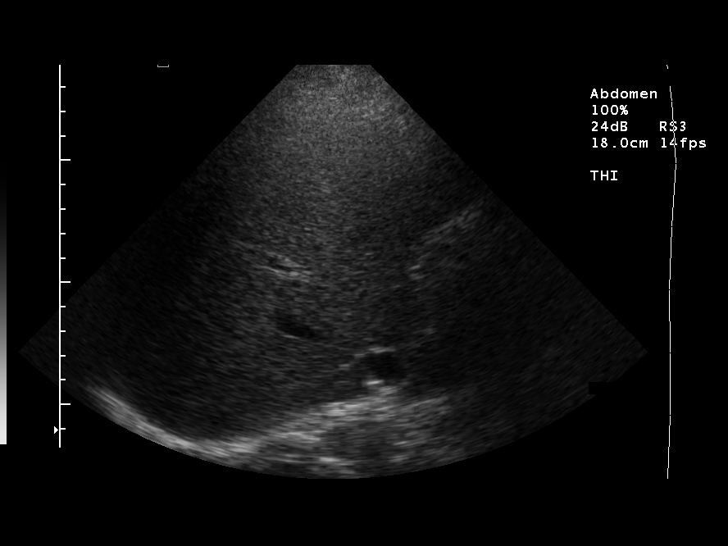
[im 38/57]
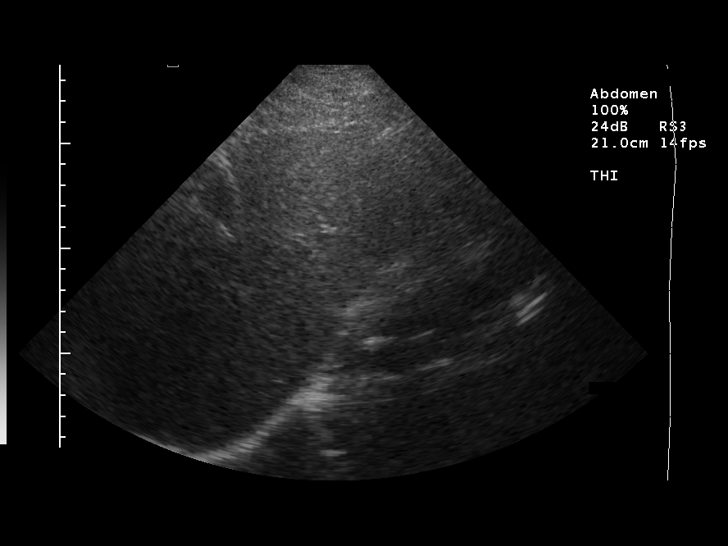
[im 43/57]
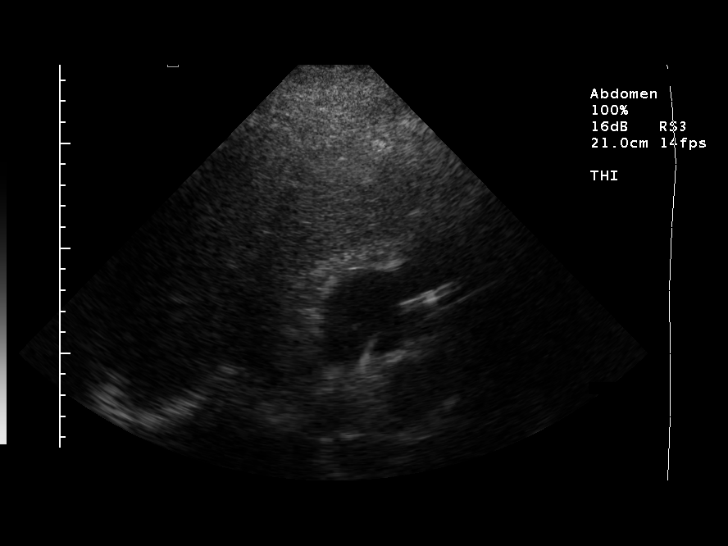
[im 47/57]
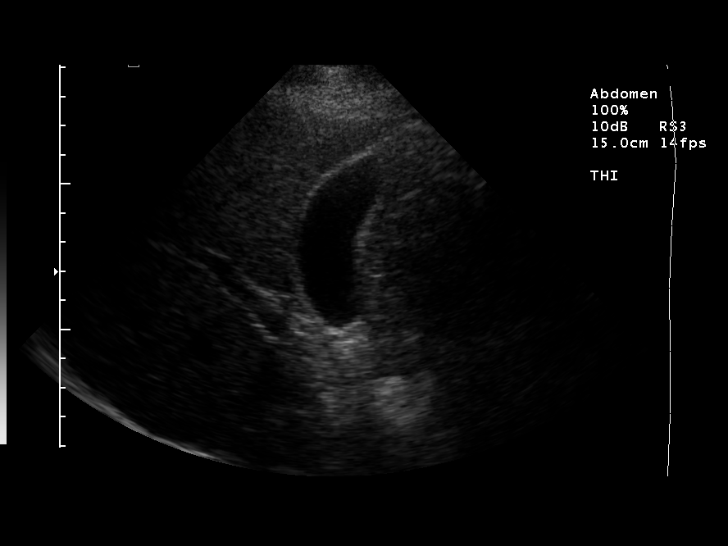
[im 52/57]
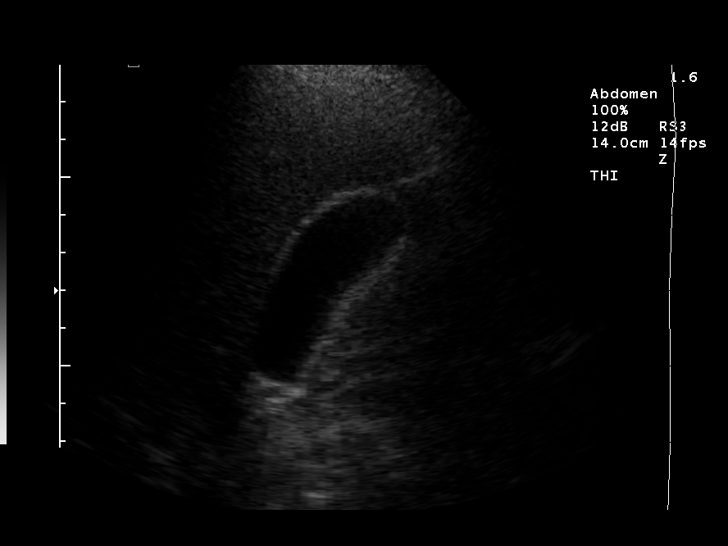
[im 57/57]
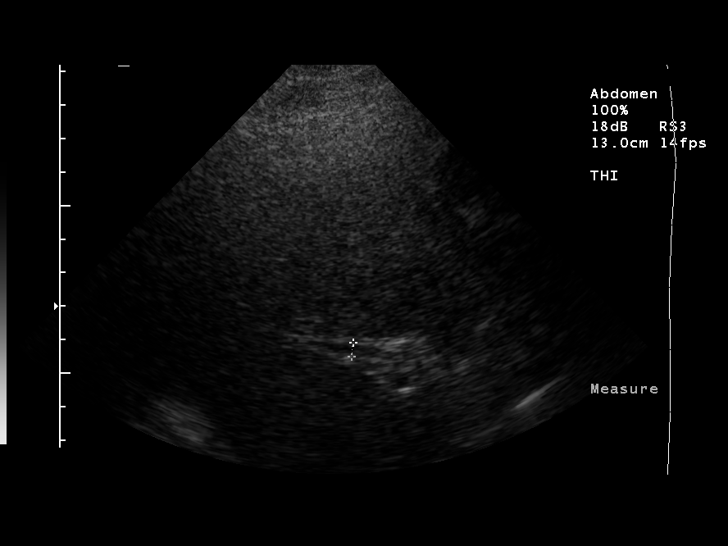

[14 of 25 positions shown; findings below may reference images not displayed]

FINDINGS: The gallbladder is well visualized and appears normal
except for some slight sludge, unchanged.  There is a negative
sonographic Murphy's sign.

There are no dilated bile ducts.  Common bile duct has a maximal
diameter of 3 mm.  The liver parenchyma, inferior vena cava,  and
spleen are within normal limits. The pancreas is not well seen.

The right kidney is 10.9 cm in length and appears normal except for
a 6 mm stone in the lower pole, unchanged since prior CT scan.

The left kidney is 11.7 cm in length and has a complex stable
cm cyst on the lower pole.

There is a left effusion.  The proximal abdominal aorta is not
visible with the distal aorta is 1.7 cm maximal diameter.
IMPRESSION: No acute abnormalities. The pancreas is not well seen.

## 2009-10-05 IMAGING — CT CT ABDOMEN W/ CM
2 of 5 series · 17 of 46 positions shown, 19 images · IV contrast (agent unspecified)
Comparison: 11/08/2007

CT ABDOMEN

CLINICAL DATA: Abdominal pain.  Hypertension.  Diabetes.
Vomiting.

CT ABDOMEN AND PELVIS WITH CONTRAST
TECHNIQUE: Multidetector CT imaging of the abdomen and pelvis was
performed using the standard protocol following bolus
administration of intravenous contrast.
Contrast: 80 ml 0mnipaque-CVV

[Series 2: routine abdomen · axial · 0.77mm/px · z∈[-464,-29]mm · 14 of 97 slices shown, 16 images]
[im 5/97  soft-tissue]
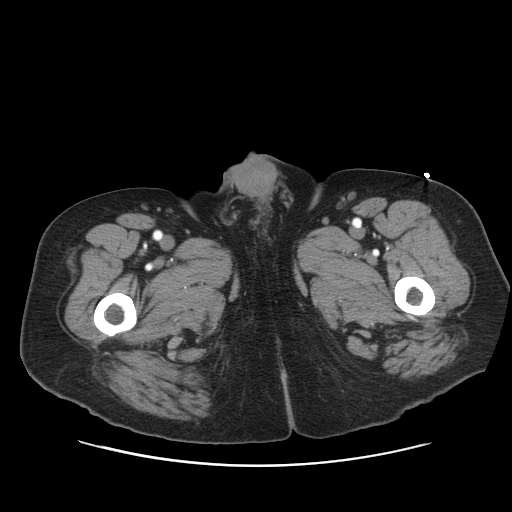
[im 5/97  bone]
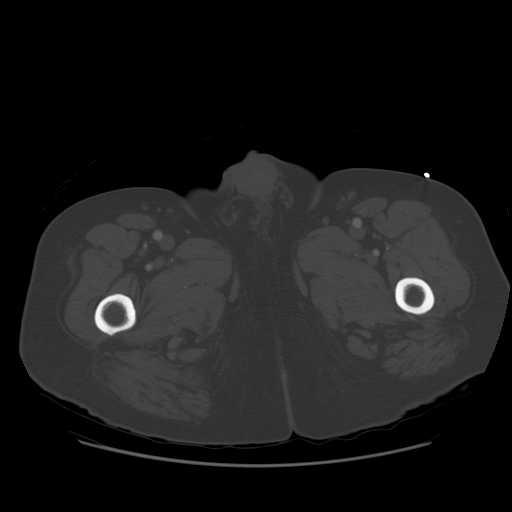
[im 14/97  soft-tissue]
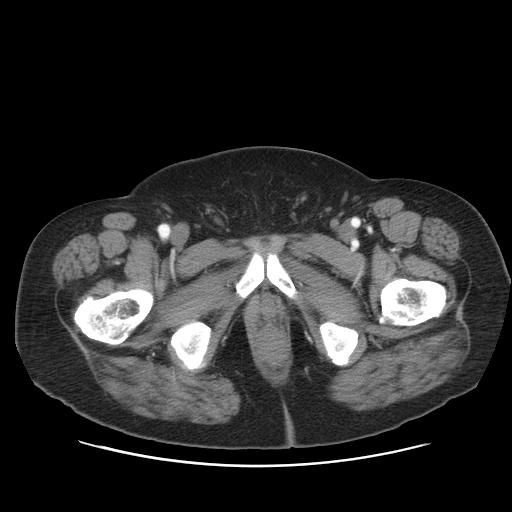
[im 19/97  soft-tissue]
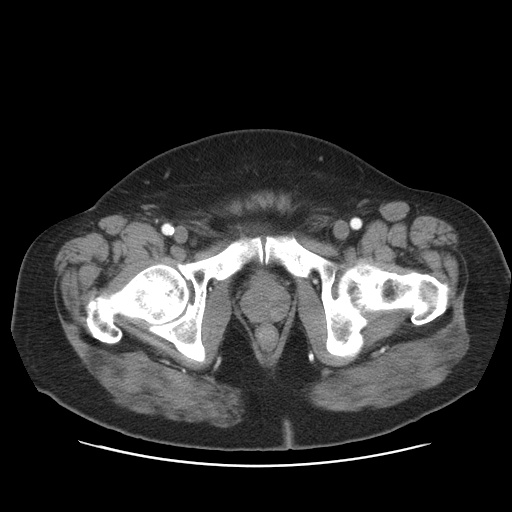
[im 28/97  soft-tissue]
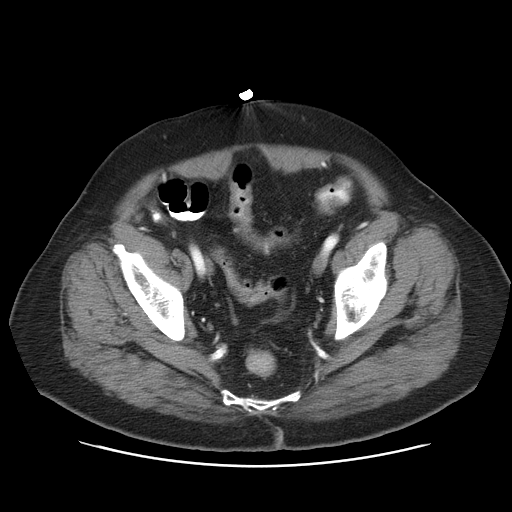
[im 33/97  soft-tissue]
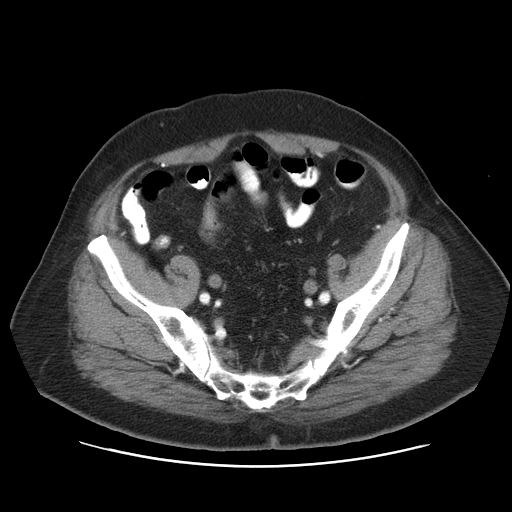
[im 37/97  soft-tissue]
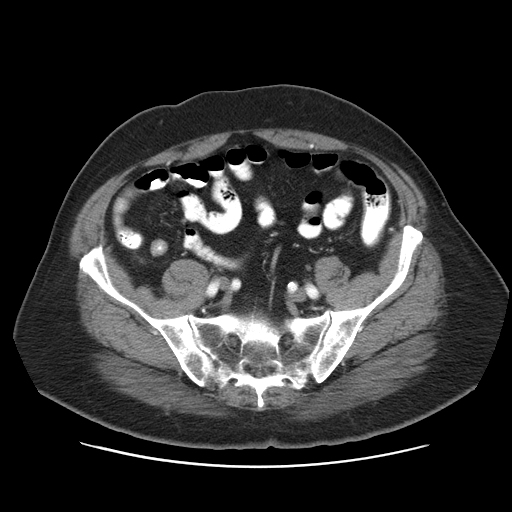
[im 46/97  soft-tissue]
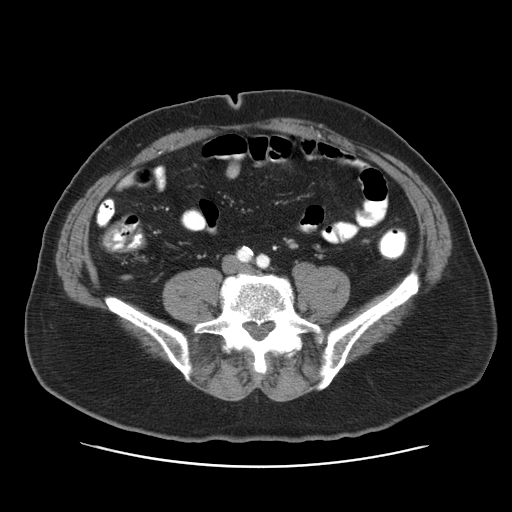
[im 51/97  soft-tissue]
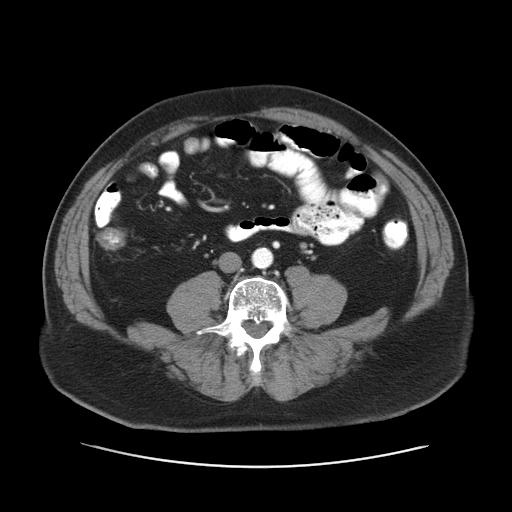
[im 60/97  soft-tissue]
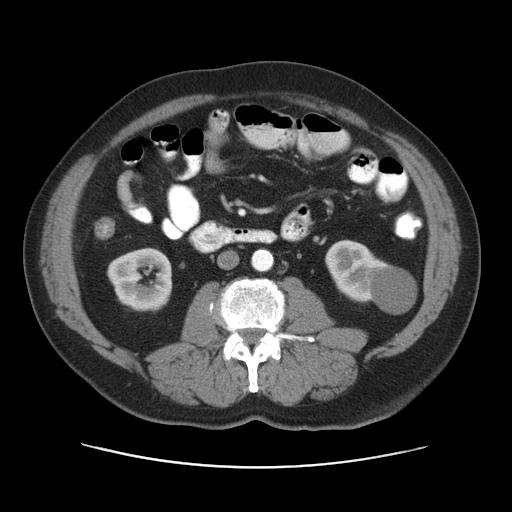
[im 60/97  bone]
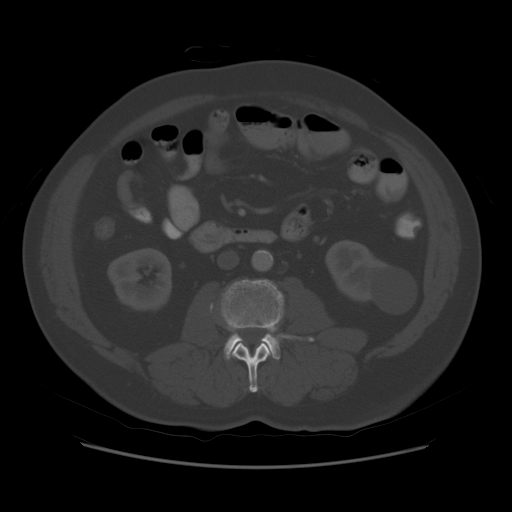
[im 65/97  soft-tissue]
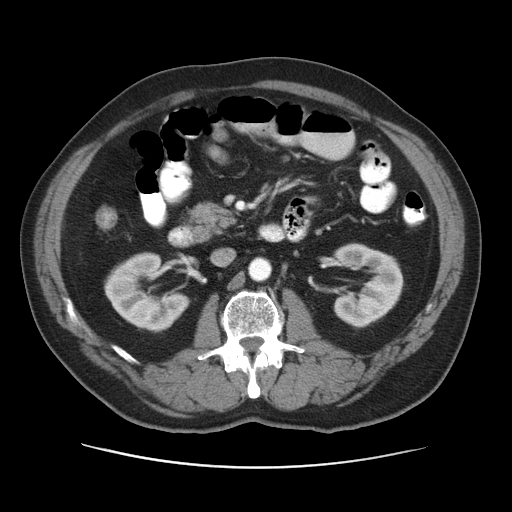
[im 74/97  soft-tissue]
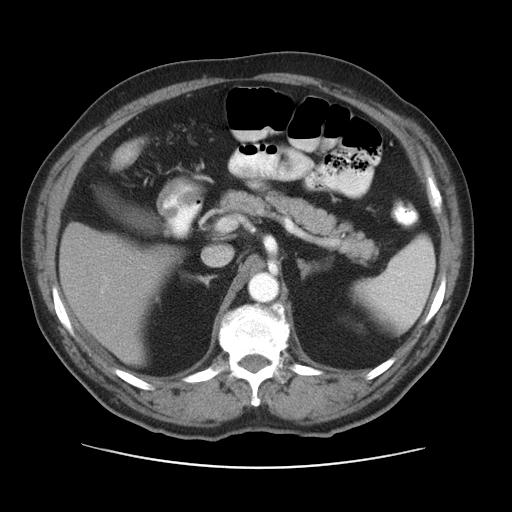
[im 78/97  soft-tissue]
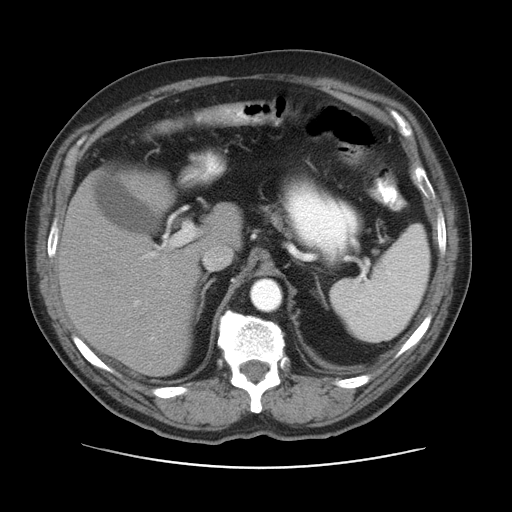
[im 83/97  soft-tissue]
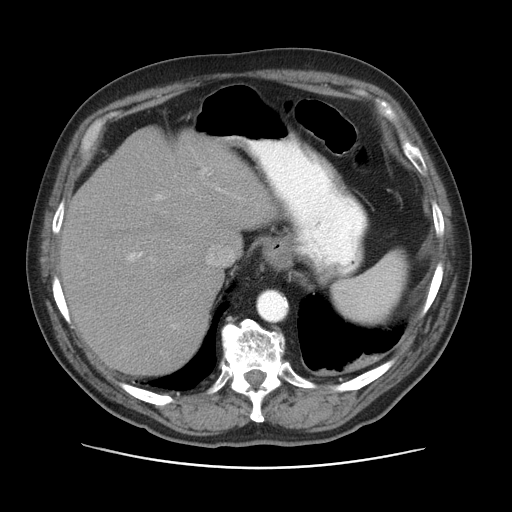
[im 92/97  soft-tissue]
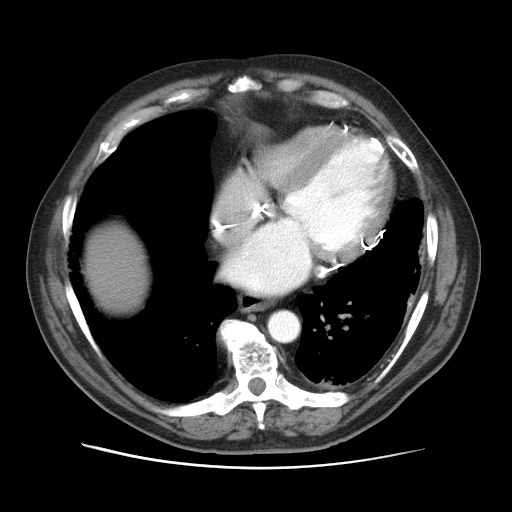

[Series 401: cor · coronal · 0.90mm/px · 3 of 163 slices shown]
[im 55/163  soft-tissue]
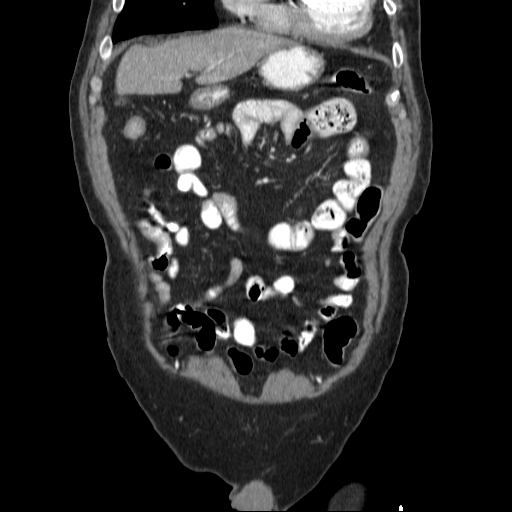
[im 73/163  soft-tissue]
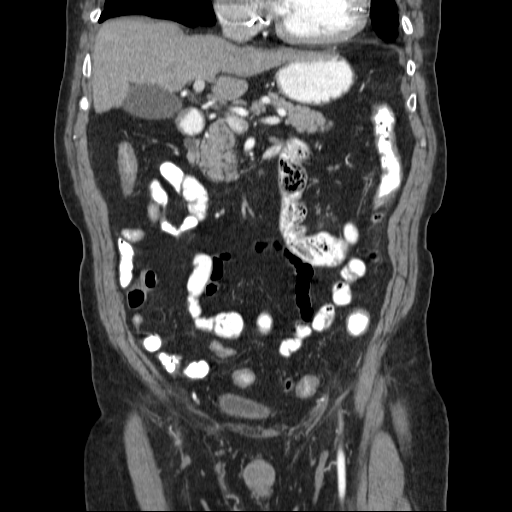
[im 91/163  soft-tissue]
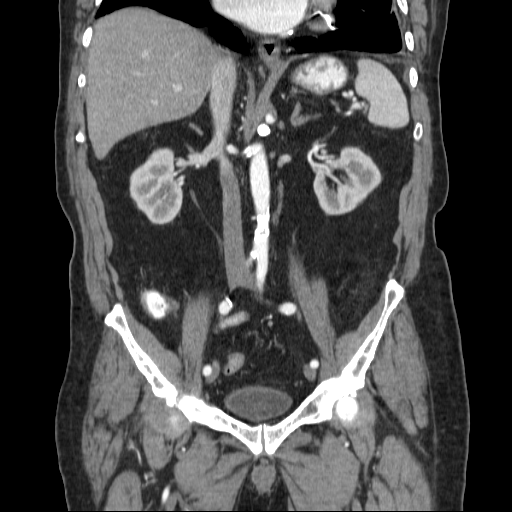

[17 of 46 positions shown; findings below may reference images not displayed]

FINDINGS: There is chronic pleural and parenchymal scarring at the
lung bases left worse than right.  This appears the same as it did
in [REDACTED].  No pleural or pericardial fluid.

The liver parenchyma is normal.  No calcified gallstones are seen.
No biliary ductal dilatation.  Spleen is normal.  Pancreas is
normal.  The adrenal glands are normal.  The right kidney is normal
except for a 5 mm stone in the lower pole.  The left kidney is
normal except for a benign appearing 3.8 cm cyst at the lower pole.
There is atherosclerosis of the aorta but no aneurysm.  The IVC is
unremarkable.  No retroperitoneal mass or adenopathy.  No free
intraperitoneal fluid or air.

There is mild dilatation of the proximal small bowel with some
stool like material.  This suggest partial small bowel obstruction.
Clearly, this is not a high-grade obstruction as contrast has
passed all the way into the rectosigmoid region.  I do not define a
distinct obstructing lesion.
IMPRESSION: Suspicion of partial small bowel obstruction in the jejunal region.
There are dilated loops that contain stool like material.  Contrast
does pass through this region however.

Chronic pleural parenchymal scarring at the lung bases left worse
than right.

Nonobstructing calculus in the lower pole of the right kidney
appears the same.

Benign-appearing renal cyst on the left appears the same.

CT PELVIS
FINDINGS: No free fluid in the pelvis.  No bowel pathology seen.
No mass or adenopathy.  No significant bony finding.
IMPRESSION: Negative CT scan of the pelvis

## 2009-10-07 IMAGING — CR DG ABDOMEN 1V
1 series · 1 of 1 positions shown · non-contrast
Comparison: 03/06/2008

CLINICAL DATA: Abdominal pain

ABDOMEN - 1 VIEW

[t abdomen supine]
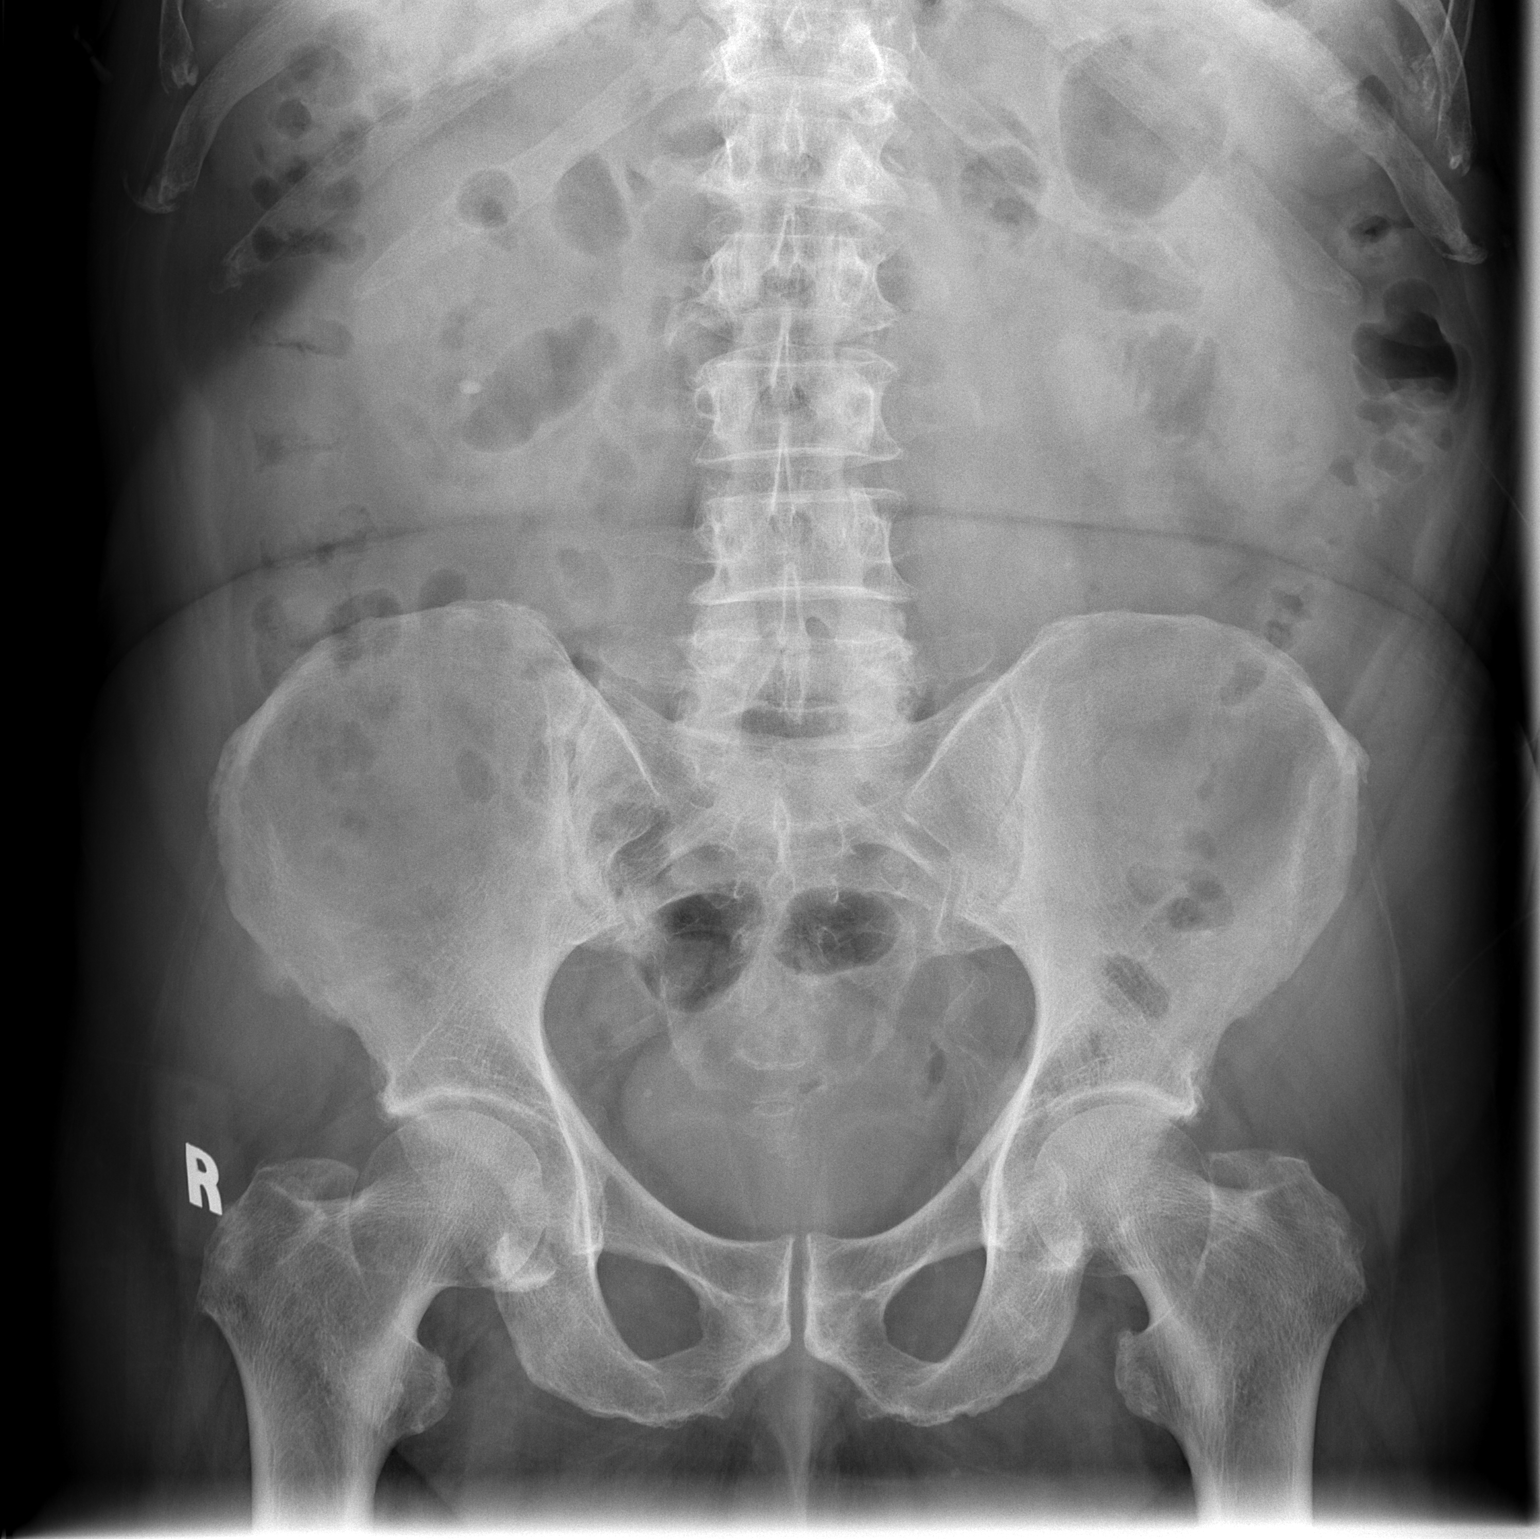

[1 of 1 positions shown; findings below may reference images not displayed]

FINDINGS: A small calculus in the lower pole of the right kidney is
stable.  Vascular calcifications are present in the pelvis.  No
disproportionate dilatation of bowel.  Moderate degenerative change
of the right hip joint and spine.  No obvious free intraperitoneal
gas.
IMPRESSION: Nonobstructive bowel gas pattern.

Right nephrolithiasis.

## 2009-10-20 ENCOUNTER — Ambulatory Visit (HOSPITAL_COMMUNITY): Admission: RE | Admit: 2009-10-20 | Discharge: 2009-10-20 | Payer: Self-pay | Admitting: Urology

## 2009-12-13 ENCOUNTER — Ambulatory Visit: Payer: Self-pay | Admitting: Internal Medicine

## 2009-12-20 IMAGING — CR DG CHEST 2V
2 series · 2 of 2 positions shown · non-contrast
Comparison: 04/12/2007 study

CLINICAL DATA: History given of asthma and hypertension.
Preoperative cardiopulmonary evaluation.

CHEST - 2 VIEW

[view not recorded (1 of 2)]
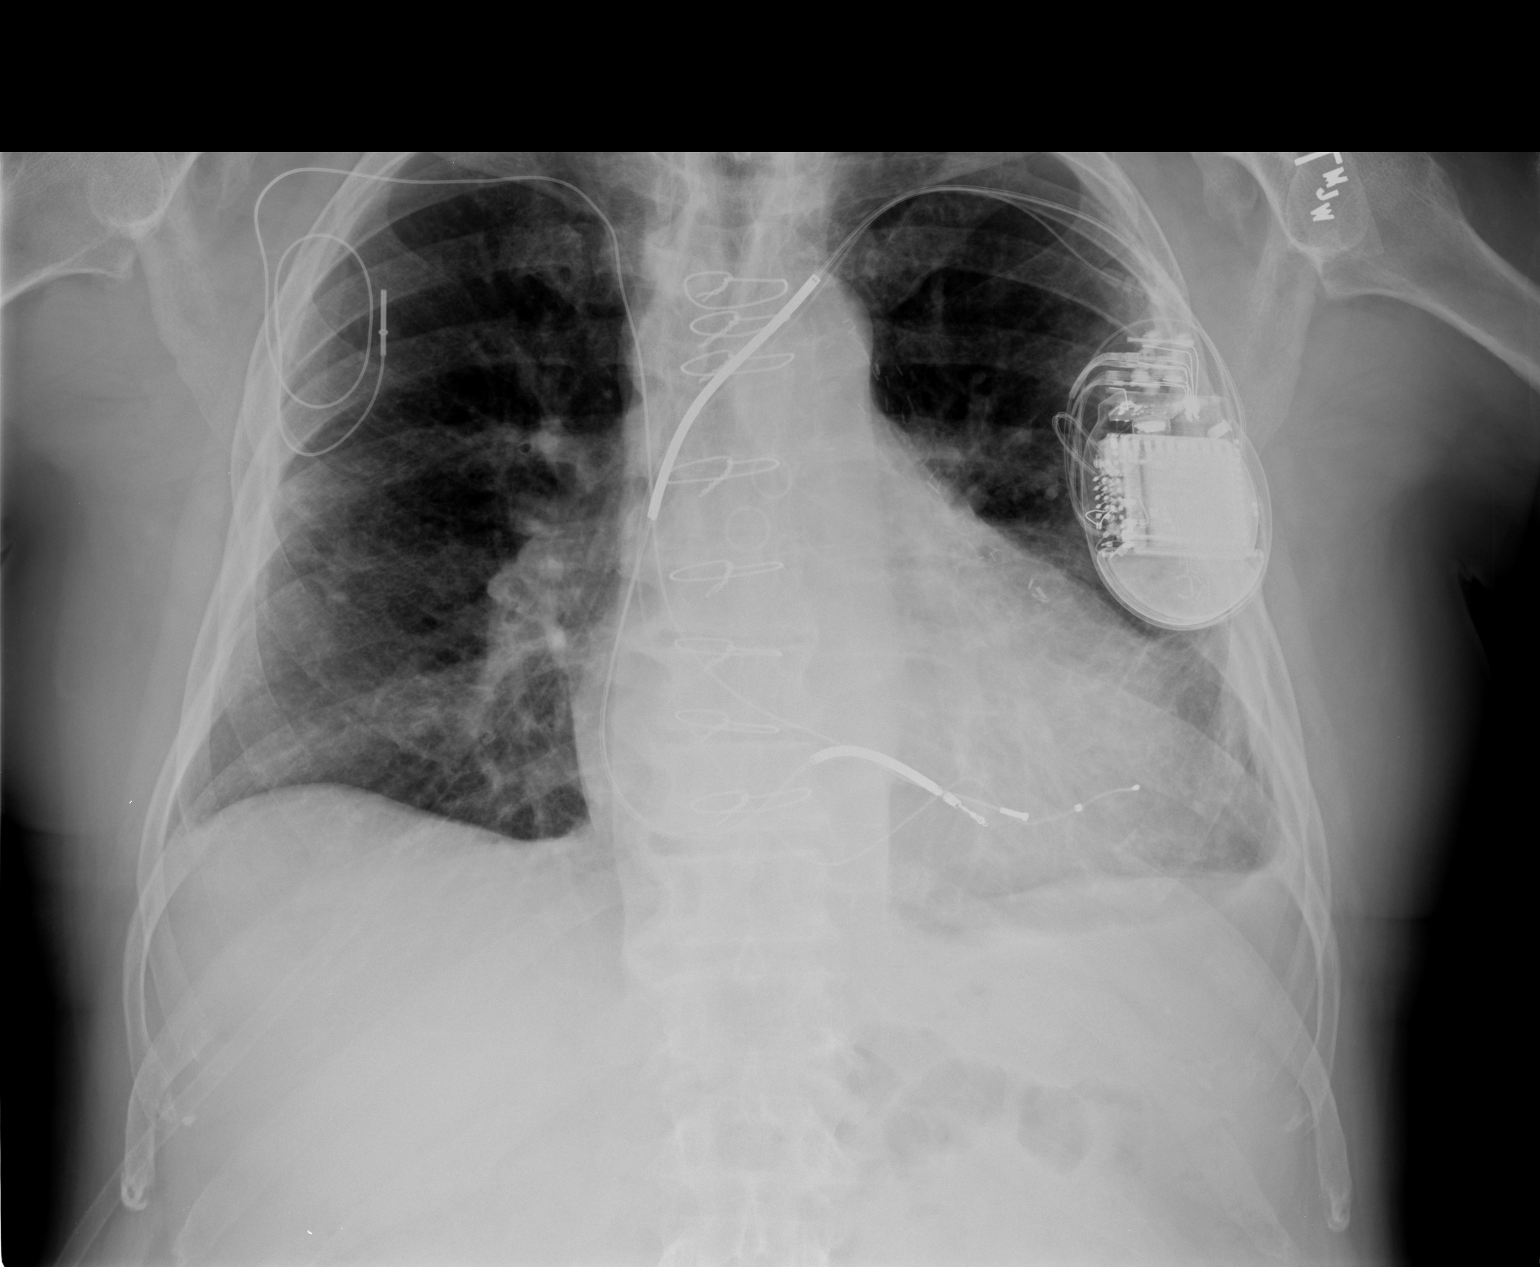

[view not recorded (2 of 2)]
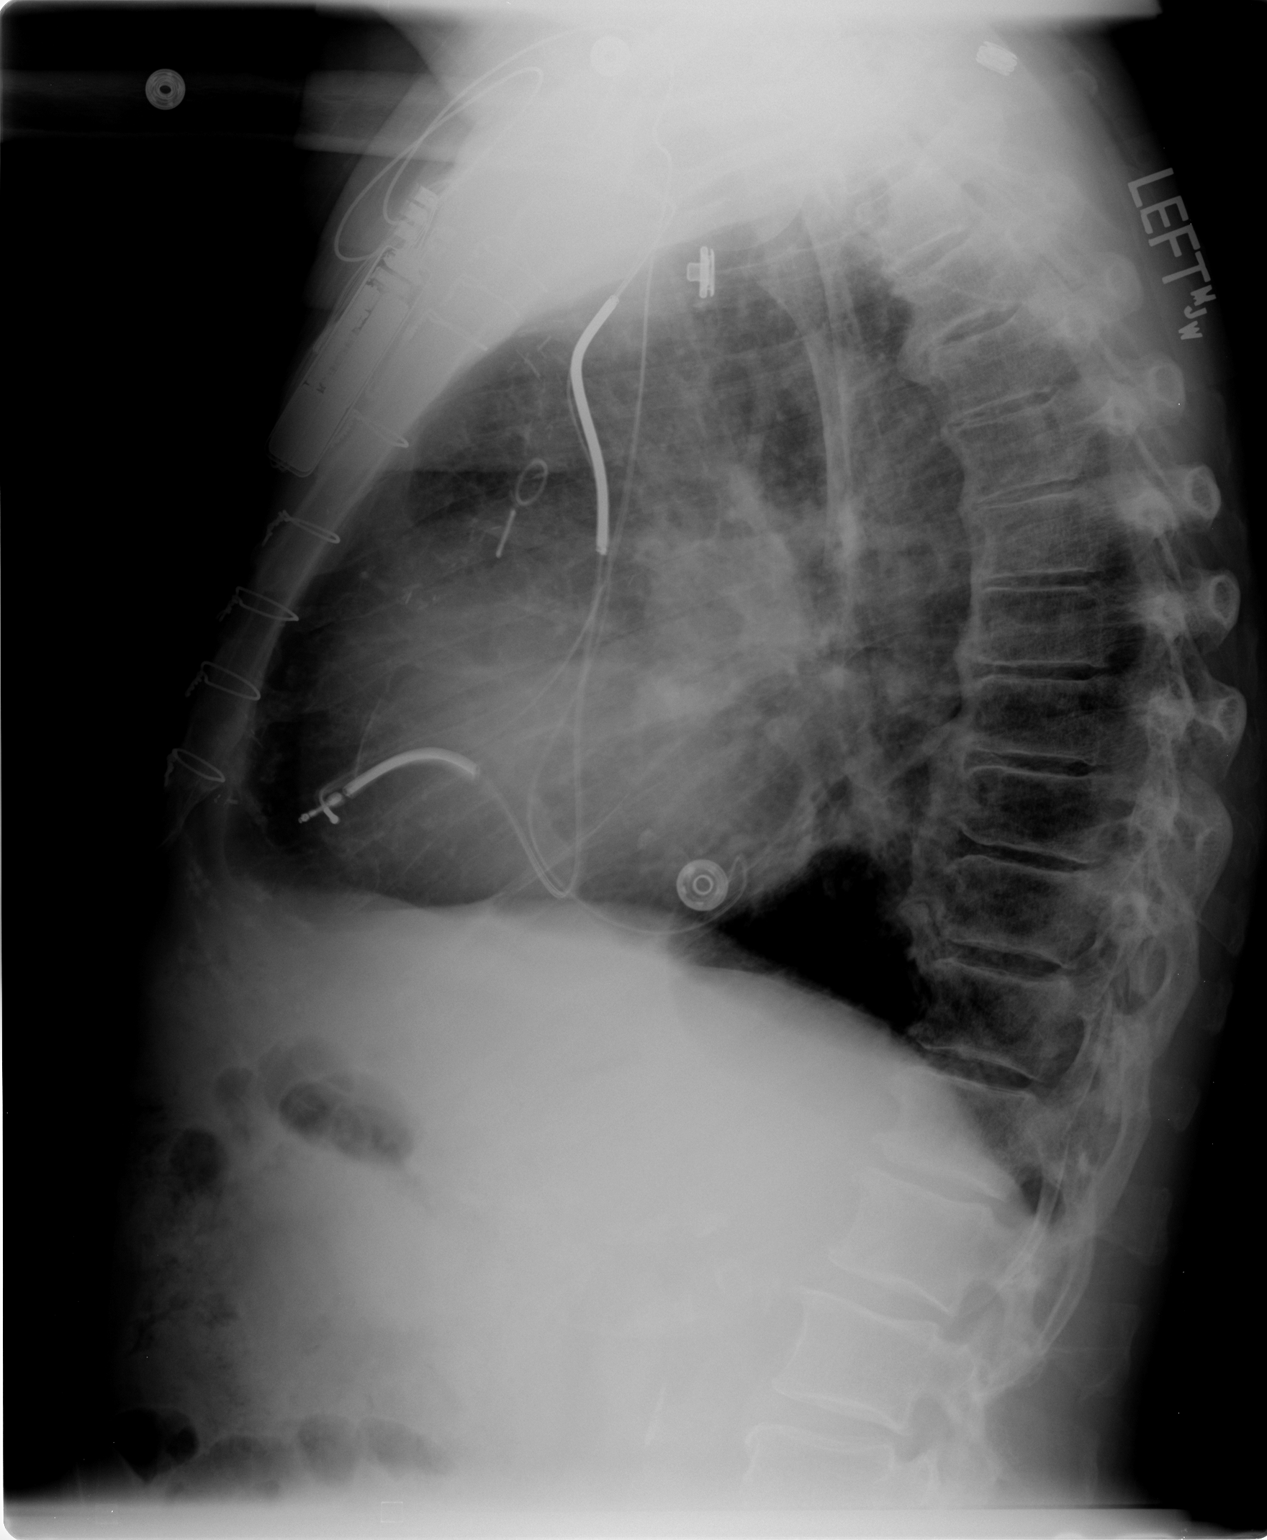

[2 of 2 positions shown; findings below may reference images not displayed]

FINDINGS: AICD pacemaker device is in place with controller device
on the left.  Previous median sternotomy and coronary bypass
grafting have been performed.  There is stable moderate enlargement
of the cardiac silhouette.  There is a transvenous lead on the
right which is not connected to the control device.  Chronic
pleural thickening and blunting of the left costophrenic angle and
lateral left hemithorax is seen without pleural calcification.
Reticular markings are seen is seen consistent with minimal
fibrosis.  No pulmonary edema or pleural effusion is evident.
Osteophytes are seen in the spine.  There is also some
syndesmophyte formation.
IMPRESSION: There is stable moderate of the enlargement cardiac silhouette with
no evidence of pulmonary edema.  The pleural thickening and
fibrosis appear stable.

## 2009-12-30 ENCOUNTER — Emergency Department (HOSPITAL_COMMUNITY): Admission: EM | Admit: 2009-12-30 | Discharge: 2009-12-30 | Payer: Self-pay | Admitting: Emergency Medicine

## 2010-01-19 ENCOUNTER — Observation Stay (HOSPITAL_COMMUNITY)
Admission: EM | Admit: 2010-01-19 | Discharge: 2010-01-20 | Payer: Self-pay | Source: Home / Self Care | Admitting: Emergency Medicine

## 2010-02-19 IMAGING — CR DG CHEST 1V PORT
1 series · 1 of 1 positions shown · non-contrast
Comparison: 05/21/2008

CLINICAL DATA: Short of breath.  Cough.  Lung and prostate cancer.

PORTABLE CHEST - 1 VIEW

[view not recorded]
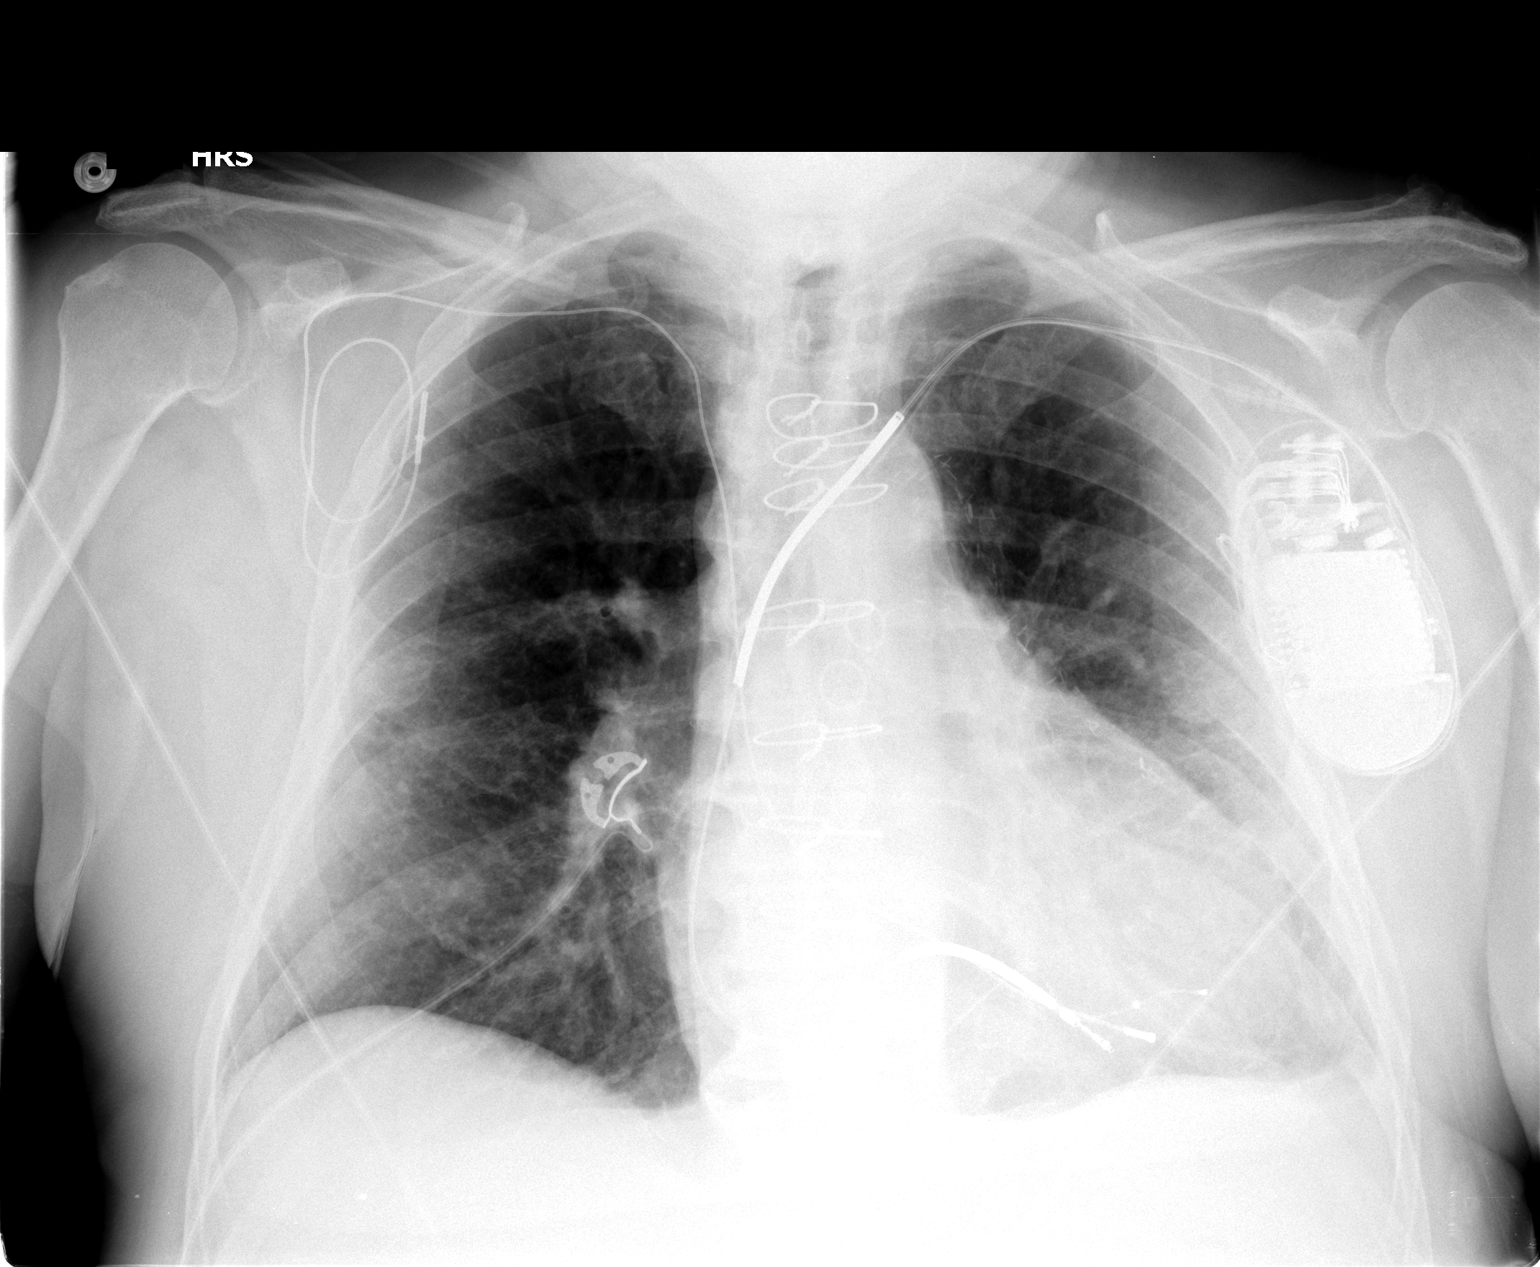

[1 of 1 positions shown; findings below may reference images not displayed]

FINDINGS: Pacemaker/AICD remains in place.  There has been previous
median sternotomy and CABG.  The heart is mildly enlarged.  There
is chronic pulmonary fibrosis and chronic pleural scarring on the
left.  No sign of acute infiltrate, mass or collapse.  No
significant bony finding.
IMPRESSION: Prior CABG.  Chronic pleural and parenchymal scarring.  No active
process evident.

## 2010-02-21 IMAGING — US US ABDOMEN COMPLETE
1 series · 14 of 25 positions shown · non-contrast
Comparison: CT abdomen pelvis of 03/05/2008

CLINICAL DATA: Abdominal pain

COMPLETE ABDOMINAL ULTRASOUND

[Series 1: us abdomen complete · 0.31mm/px · 14 of 66 slices shown]
[im 1/66]
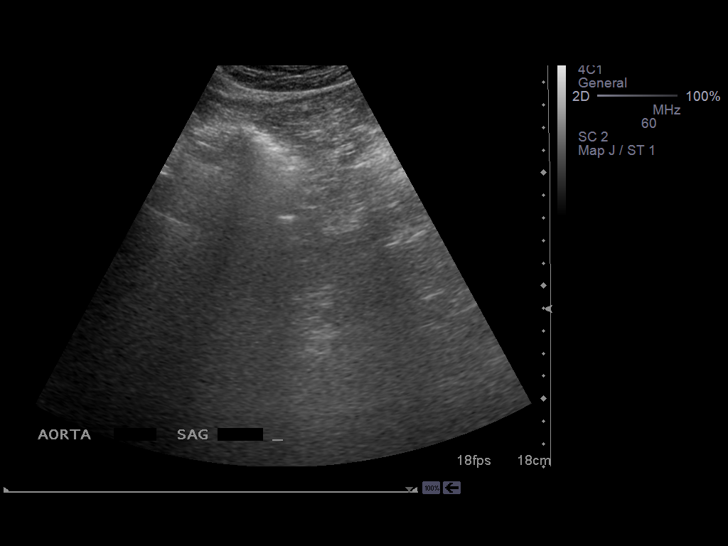
[im 6/66]
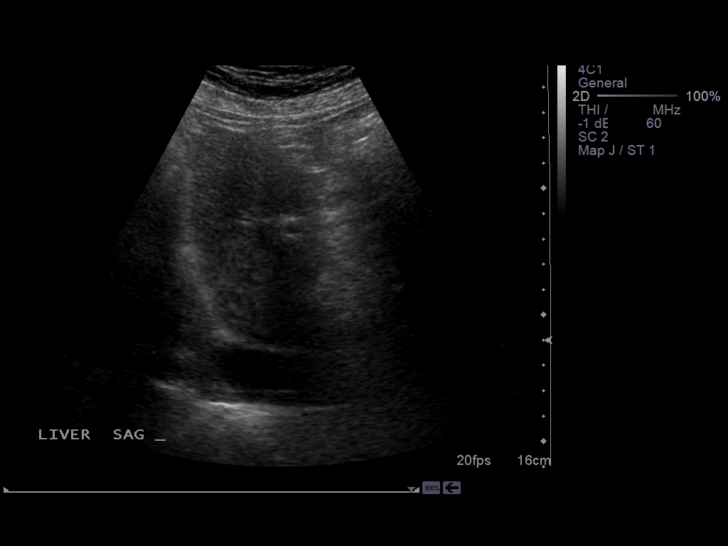
[im 11/66]
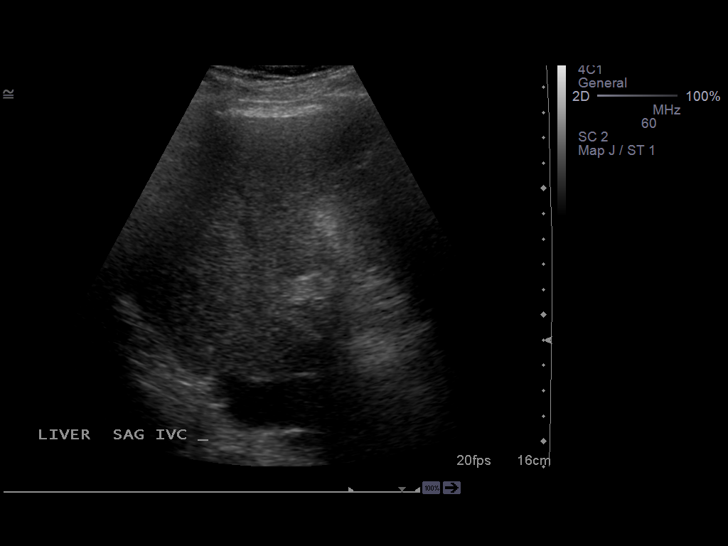
[im 17/66]
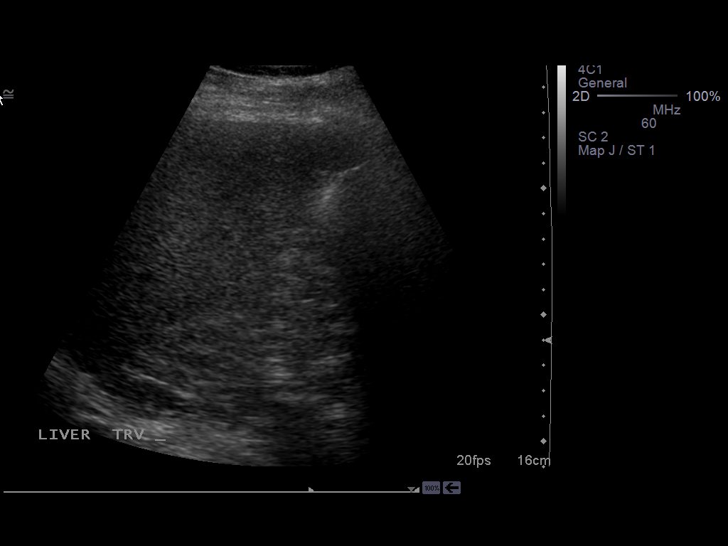
[im 22/66]
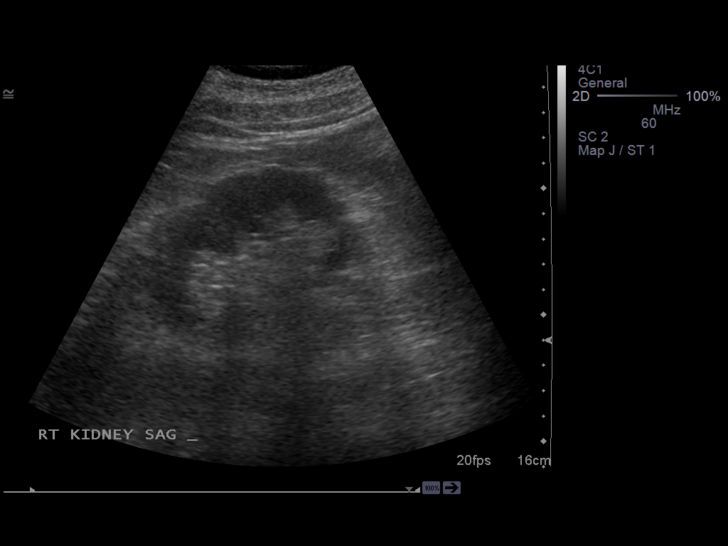
[im 25/66]
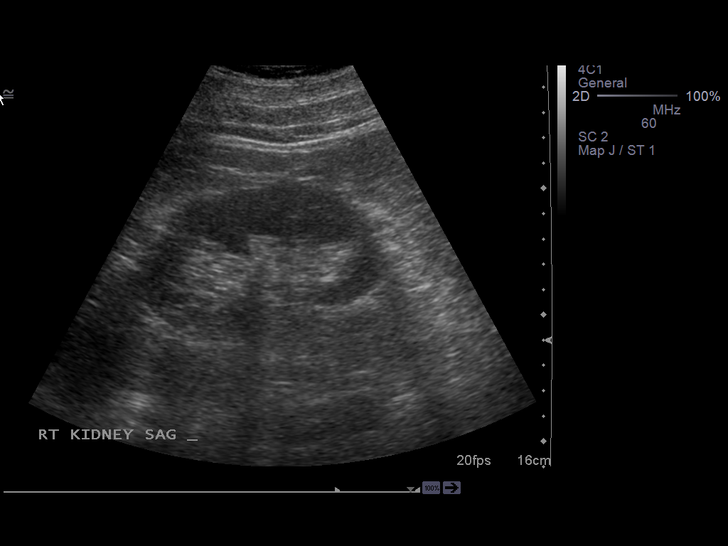
[im 30/66]
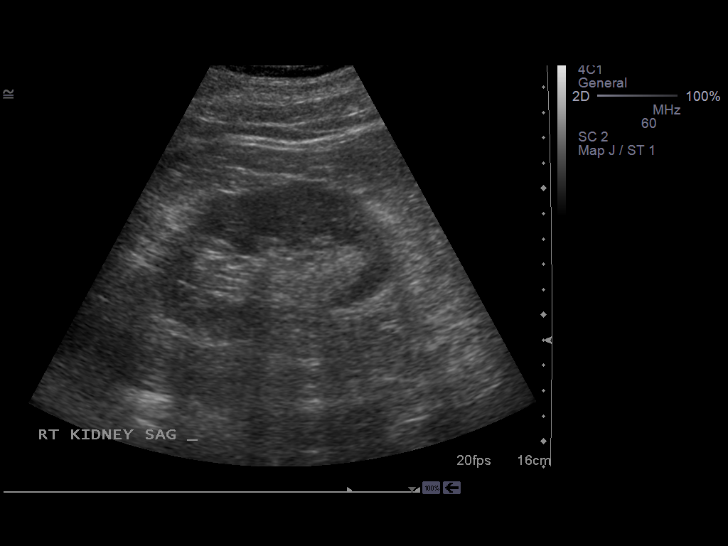
[im 36/66]
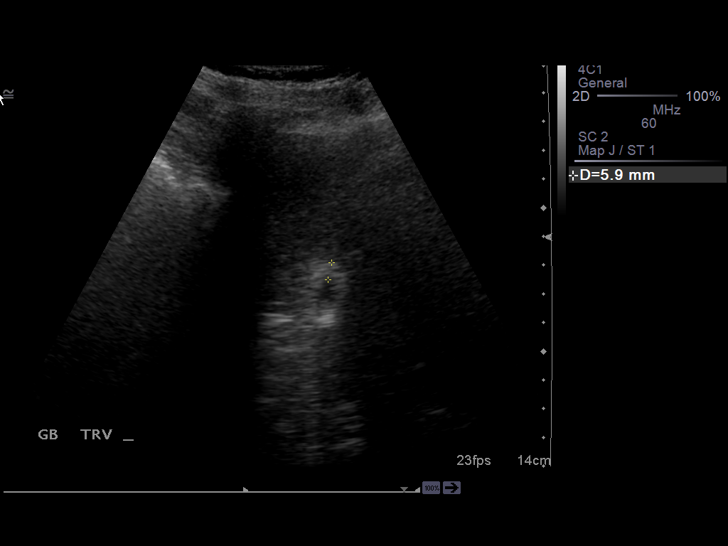
[im 41/66]
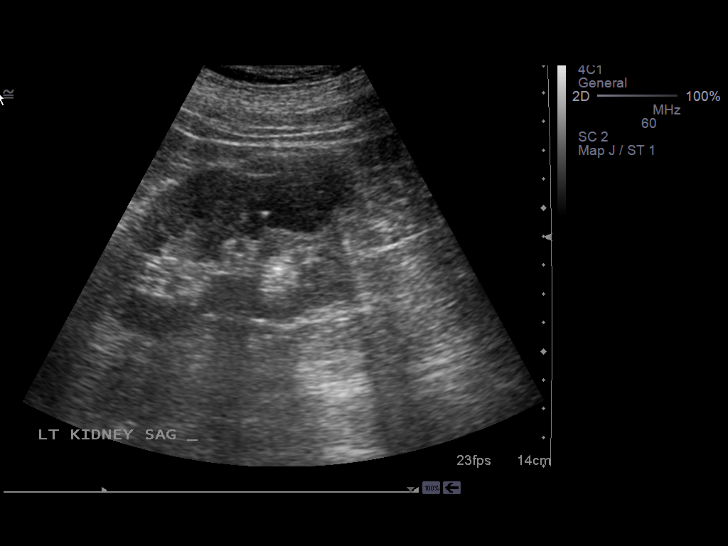
[im 44/66]
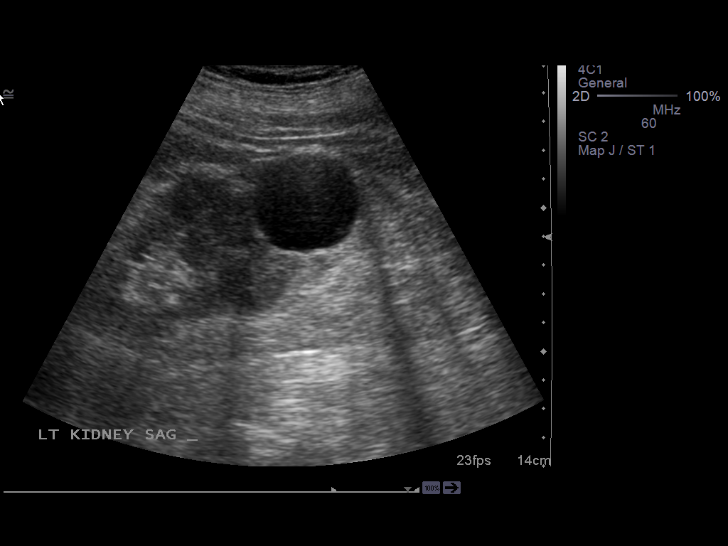
[im 49/66]
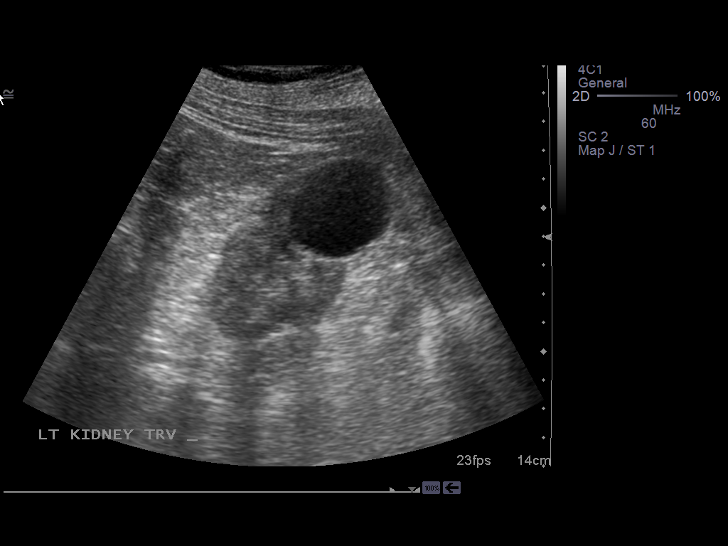
[im 55/66]
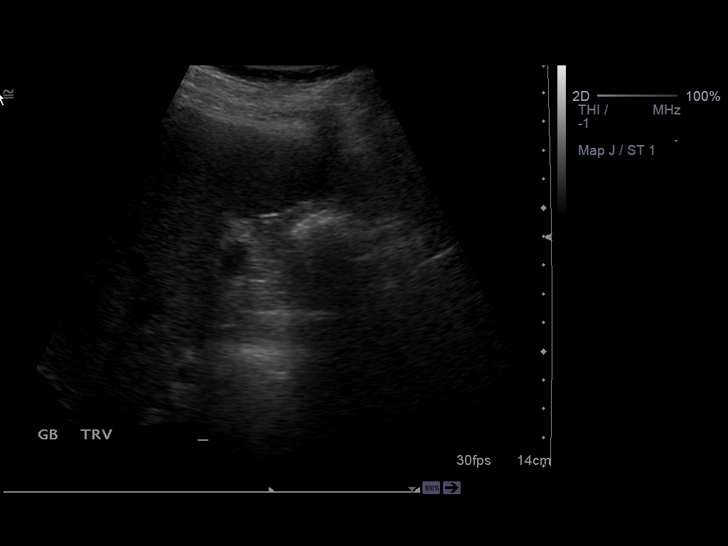
[im 60/66]
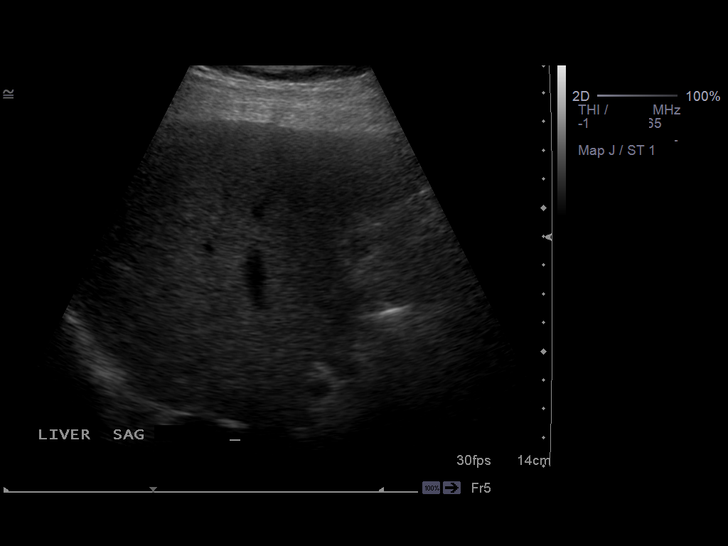
[im 66/66]
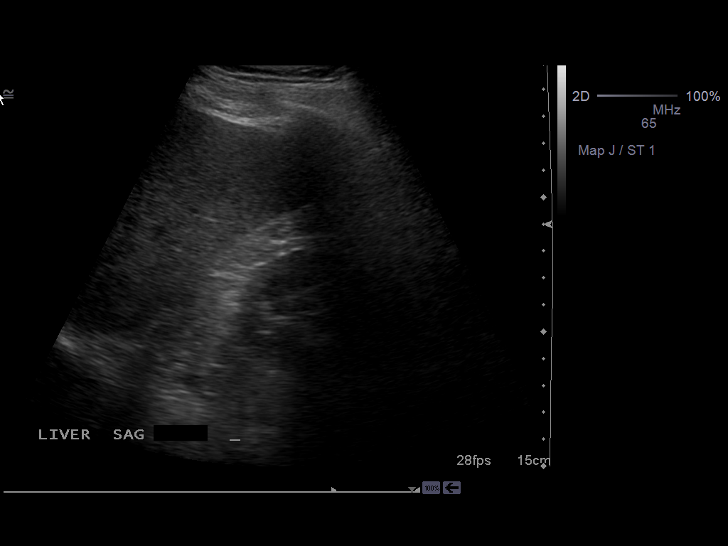

[14 of 25 positions shown; findings below may reference images not displayed]

FINDINGS: Gallbladder:  The gallbladder is contracted.  No definite
gallstones are seen and there is no pain over the gallbladder upon
compression.

Common bile duct:  The common bile duct is normal measuring 3.0 mm
in diameter.

Liver:  The liver has a normal echogenic pattern.  No ductal
dilatation is seen.

IVC:  Visualized.

Pancreas:  The pancreas is largely obscured by bowel gas.

Spleen:  The spleen is normal in size.

Right Kidney:  No hydronephrosis is seen.  The right kidney
measures 10.9 cm sagittally.

Left Kidney:  No hydronephrosis.  The left kidney measures 10.6 cm.
There is a cyst in the left kidney of 3.8 x 3.5 x 3.7 cm in the
lower pole laterally.

Abdominal aorta:  The abdominal aorta is obscured by bowel gas.

Other Findings:  None.
IMPRESSION: 1.  Contracted gallbladder.  No definite gallstones.  No ductal
dilatation.
2.  No hydronephrosis.
3.  The pancreas is obscured by bowel gas.

## 2010-03-07 ENCOUNTER — Emergency Department (HOSPITAL_COMMUNITY)
Admission: EM | Admit: 2010-03-07 | Discharge: 2010-03-07 | Payer: Self-pay | Source: Home / Self Care | Admitting: Emergency Medicine

## 2010-03-16 ENCOUNTER — Encounter: Payer: Self-pay | Admitting: Internal Medicine

## 2010-03-16 ENCOUNTER — Ambulatory Visit
Admission: RE | Admit: 2010-03-16 | Discharge: 2010-03-16 | Payer: Self-pay | Source: Home / Self Care | Attending: Internal Medicine | Admitting: Internal Medicine

## 2010-03-22 ENCOUNTER — Encounter (INDEPENDENT_AMBULATORY_CARE_PROVIDER_SITE_OTHER): Payer: Self-pay | Admitting: *Deleted

## 2010-04-11 NOTE — Letter (Signed)
Summary: Device-Delinquent Phone Journalist, newspaper, Main Office  [address]   [place], [place] [address]   Phone: [phone]  Fax: [phone]     August 26, 2009 MRN: [id number]   [facility] [place] [address] [place], [place]  [address]   Dear [name],  According to our records, you were scheduled for a device phone transmission on 08-22-2009.     We did not receive any results from this check.  If you transmitted on your scheduled day, please call us to help troubleshoot your system.  If you forgot to send your transmission, please send one upon receipt of this letter.  Thank you,   Architectural technologist [facility]

## 2010-04-11 NOTE — Assessment & Plan Note (Signed)
Summary: [facility]   Visit Type:  Follow-up Primary Provider:  Pearson Grippe, MD   History of Present Illness: [name] returns today for followup.  He is a pleasant 77 yo man with a h/o Atrial fibrillation, CHF, ICM, and EF 25%.  He is s/p ICD.  He denies any intercurrent ICD therapies.  No c/p or sob.  His daughter states that he remains active.  Current Medications (verified): 1)  Allopurinol 300 Mg Tabs (Allopurinol) .[name].. 1 By Mouth Qd 2)  Lipitor 20 Mg Tabs (Atorvastatin Calcium) .... Take One Tablet By Mouth Daily. 3)  Toprol Xl 25 Mg Xr24h-Tab (Metoprolol Succinate) .[name].. 1 By Mouth Once Daily 4)  Glipizide 5 Mg Tabs (Glipizide) .[name].. 1 By Mouth Once Daily 5)  Lanoxin 0.125 Mg Tabs (Digoxin) .[name].. 1 By Mouth Once Daily 6)  Lisinopril 20 Mg Tabs (Lisinopril) .... Take One Tablet By Mouth Daily 7)  Coumadin 2.5 Mg Tabs (Warfarin Sodium) .... Uad 8)  Isosorbide Mononitrate Cr 30 Mg Xr24h-Tab (Isosorbide Mononitrate) .[name].. 1 By Mouth Once Daily 9)  Furosemide 40 Mg Tabs (Furosemide) .... Take One Tablet By Mouth Two Times A Day 10)  Omeprazole 20 Mg Cpdr (Omeprazole) .... Take One Tablet By Mouth Once Daily.  Allergies: 1)  ! Jonne Ply  Past History:  Past Medical History: Last updated: 06/01/2008 Current Problems:  ATRIAL FIBRILLATION (ICD-427.31) HEART BLOCK (ICD-426.9) CHF (ICD-428.0) CARDIOMYOPATHY, ISCHEMIC (ICD-414.8) Previous cerebrovascular accident.  Hypertension Partial small bowel obstruction Diabetes mellitus  Nephrolithiasis.  gout.  Past Surgical History: Last updated: 06/01/2008 status post BiV ICD insertion  Colonoscopy Upper endoscopy  Review of Systems  The patient denies chest pain, syncope, dyspnea on exertion, and peripheral edema.    Vital Signs:  Patient profile:   77 year old male Height:      66 inches Weight:      170 pounds BMI:     27.54 Pulse rate:   60 / minute BP sitting:   154 / 78  (left arm)  Vitals Entered By: Laurance Flatten CMA  (December 13, 2009 9:33 AM)  Physical Exam  General:  elderly, well developed, well nourished, in no acute distress.  HEENT: normal Neck: supple. 7 cm  JVD. Carotids 2+ bilaterally no bruits Cor:I RRR no rubs, gallops or murmur Lungs: CTA. Well healed ICD incision. Ab: soft, nontender. nondistended. No HSM. Good bowel sounds Ext: warm. no cyanosis, clubbing. Trace edema. Neuro: alert and oriented. Grossly nonfocal. affect pleasant     ICD Specifications Following MD:  Lewayne Bunting, MD     Referring MD:  [facility] ICD Vendor:  St Jude     ICD Model Number:  [phone]     ICD Serial Number:  [id number] ICD DOI:  07/03/2006     ICD Implanting MD:  Lewayne Bunting, MD  Lead 1:    Location: RV     DOI: 09/17/2003     Model #: 1584     Serial #: [id number]     Status: active Lead 2:    Location: LV     DOI: 07/03/2006     Model #: 1158T     Serial #: [id number]     Status: active  Indications::  CHF; CHB  Explantation Comments: Merlin Pacemaker dependent  ICD Follow Up Remote Check?  No Battery Voltage:  2.90 V     Charge Time:  11.1 seconds     Battery Est. Longevity:  3.6 years Underlying rhythm:  dependent ICD Dependent:  Yes       ICD Device Measurements Right Ventricle:  Amplitude: 8.4 mV, Impedance: 510 ohms, Threshold: 0.5 V at 0.5 msec Left Ventricle:  Impedance: 390 ohms, Threshold: 0.75 V at 0.5 msec Configuration: LV TIP-RV RING Shock Impedance: 46 ohms   Episodes Coumadin:  Yes Shock:  0     ATP:  0     Nonsustained:  0     Ventricular Pacing:  98%  Brady Parameters Mode VVIR     Lower Rate Limit:  60     Upper Rate Limit 130 Rate Response Parameters:  IV pace delay RV->LV  Tachy Zones VF:  160     VT:  200     VT1:  240     Next Remote Date:  03/16/2010     Next Cardiology Appt Due:  12/11/2010 Tech Comments:  Quick opt done and reprogrammed as above.  Rate response blunted but adequate for the patient's level of activity.  Merlin transmissions every 3 months.  ROV 1  year with Dr. [name]. Altha Harm, LPN  December 13, 2009 9:38 AM  MD Comments:  Agree with above.  Impression & Recommendations:  Problem # 1:  AUTOMATIC IMPLANTABLE CARDIAC DEFIBRILLATOR SITU (ICD-V45.02) His device is working normally.  Will recheck in several months.  Problem # 2:  ATRIAL FIBRILLATION (ICD-427.31) His ventricular rates appear to be well controlled.  Will follow. His updated medication list for this problem includes:    Toprol Xl 25 Mg Xr24h-tab (Metoprolol succinate) .[name]... 1 by mouth once daily    Lanoxin 0.125 Mg Tabs (Digoxin) .[name]... 1 by mouth once daily    Coumadin 2.5 Mg Tabs (Warfarin sodium) ..... Uad  Problem # 3:  CHF (ICD-428.0) His symptoms appear to be class 2.  Will continue meds as below.  I have recommended a low sodium diet. His updated medication list for this problem includes:    Toprol Xl 25 Mg Xr24h-tab (Metoprolol succinate) .[name]... 1 by mouth once daily    Lanoxin 0.125 Mg Tabs (Digoxin) .[name]... 1 by mouth once daily    Lisinopril 20 Mg Tabs (Lisinopril) .[name]... Take one tablet by mouth daily    Coumadin 2.5 Mg Tabs (Warfarin sodium) ..... Uad    Isosorbide Mononitrate Cr 30 Mg Xr24h-tab (Isosorbide mononitrate) .[name]... 1 by mouth once daily    Furosemide 40 Mg Tabs (Furosemide) .[name]... Take one tablet by mouth two times a day  Patient Instructions: 1)  Your physician wants you to follow-up in: 12 months with Dr Court Joy will receive a reminder letter in the mail two months in advance. If you don't receive a letter, please call our office to schedule the follow-up appointment. 2)  Merlin transmission on 03/16/2010

## 2010-04-11 NOTE — Letter (Signed)
Summary: Remote Device Check  [employer], Main Office  [address]   [place], [place] [address]   Phone: [phone]  Fax: [phone]     September 21, 2009 MRN: [id number]   [facility] [place] [address] [place], [place]  [address]   Dear Mr. Pankow,   Your remote transmission was recieved and reviewed by your physician.  All diagnostics were within normal limits for you.   __X____Your next office visit is scheduled for: September w/Dr [name]. Please call our office to schedule an appointment.    Sincerely,  Vella Kohler

## 2010-04-11 NOTE — Cardiovascular Report (Signed)
Summary: Office Visit   Office Visit   Imported By: Roderic Ovens 05/30/2009 15:14:38  _____________________________________________________________________  External Attachment:    Type:   Image     Comment:   External Document

## 2010-04-11 NOTE — Cardiovascular Report (Signed)
Summary: Office Visit   Office Visit   Imported By: Roderic Ovens 12/19/2009 11:36:34  _____________________________________________________________________  External Attachment:    Type:   Image     Comment:   External Document

## 2010-04-11 NOTE — Cardiovascular Report (Signed)
Summary: Office Visit Remote   Office Visit Remote   Imported By: Roderic Ovens 09/22/2009 16:39:32  _____________________________________________________________________  External Attachment:    Type:   Image     Comment:   External Document

## 2010-04-11 NOTE — Assessment & Plan Note (Signed)
Summary: icd [email or web address] [email or web address]   Visit Type:  Follow-up   History of Present Illness: [name] returns today for followup.  He is a pleasant 77 yo man with a h/o Atrial fibrillation, CHF, ICM, and EF 25%.  He is s/p ICD.  He denies any intercurrent ICD therapies.  No c/p or sob.  His daughter states that he remains active.  Current Medications (verified): 1)  Allopurinol 300 Mg Tabs (Allopurinol) .[name].. 1 By Mouth Qd 2)  Lipitor 20 Mg Tabs (Atorvastatin Calcium) .... Take One Tablet By Mouth Daily. 3)  Toprol Xl 25 Mg Xr24h-Tab (Metoprolol Succinate) .[name].. 1 By Mouth Once Daily 4)  Glipizide 5 Mg Tabs (Glipizide) .[name].. 1 By Mouth Once Daily 5)  Lanoxin 0.125 Mg Tabs (Digoxin) .[name].. 1 By Mouth Once Daily 6)  Lisinopril 20 Mg Tabs (Lisinopril) .... Take One Tablet By Mouth Daily 7)  Coumadin 2.5 Mg Tabs (Warfarin Sodium) .... Uad 8)  Isosorbide Mononitrate Cr 30 Mg Xr24h-Tab (Isosorbide Mononitrate) .[name].. 1 By Mouth Once Daily 9)  Furosemide 40 Mg Tabs (Furosemide) .... Take One Tablet By Mouth Two Times A Day 10)  Omeprazole 20 Mg Cpdr (Omeprazole) .... Take One Tablet By Mouth Once Daily.  Allergies (verified): 1)  ! Jonne Ply  Past History:  Past Medical History: Last updated: 06/01/2008 Current Problems:  ATRIAL FIBRILLATION (ICD-427.31) HEART BLOCK (ICD-426.9) CHF (ICD-428.0) CARDIOMYOPATHY, ISCHEMIC (ICD-414.8) Previous cerebrovascular accident.  Hypertension Partial small bowel obstruction Diabetes mellitus  Nephrolithiasis.  gout.  Past Surgical History: Last updated: 06/01/2008 status post BiV ICD insertion  Colonoscopy Upper endoscopy  Review of Systems  The patient denies chest pain, syncope, dyspnea on exertion, and peripheral edema.    Vital Signs:  Patient profile:   77 year old male Height:      66 inches Weight:      170 pounds BMI:     27.54 O2 Sat:      99 % Pulse rate:   77 / minute BP sitting:   122 / 70  (left arm)  Vitals Entered By: Laurance Flatten  CMA (May 24, 2009 9:33 AM)  Physical Exam  General:  elderly, well developed, well nourished, in no acute distress.  HEENT: normal Neck: supple. 7 cm  JVD. Carotids 2+ bilaterally no bruits Cor:I RRR no rubs, gallops or murmur Lungs: CTA. Well healed ICD incision. Ab: soft, nontender. nondistended. No HSM. Good bowel sounds Ext: warm. no cyanosis, clubbing. Trace edema. Neuro: alert and oriented. Grossly nonfocal. affect pleasant     ICD Specifications Following MD:  Lewayne Bunting, MD     Referring MD:  [facility] ICD Vendor:  St Jude     ICD Model Number:  [id number]     ICD Serial Number:  [id number] ICD DOI:  07/03/2006     ICD Implanting MD:  Lewayne Bunting, MD  Lead 1:    Location: RV     DOI: 09/17/2003     Model #: 1584     Serial #: [id number]     Status: active Lead 2:    Location: LV     DOI: 07/03/2006     Model #: 1158T     Serial #: [id number]     Status: active  Indications::  CHF; CHB  Explantation Comments: Merlin Pacemaker dependent  ICD Follow Up Remote Check?  No Battery Voltage:  3.01 V     Charge Time:  10.5 seconds     Battery Est. Longevity:  4 years Underlying rhythm:  dependent ICD Dependent:  Yes       ICD Device Measurements Right Ventricle:  Amplitude: 10.3 mV, Impedance: 440 ohms, Threshold: 0.5 V at 0.5 msec Left Ventricle:  Impedance: 360 ohms, Threshold: 0.75 V at 0.5 msec Configuration: LV TIP-RV RING  Episodes Coumadin:  Yes Shock:  0     ATP:  0     Nonsustained:  0     Ventricular Pacing:  100%  Brady Parameters Mode VVIR     Lower Rate Limit:  60     Upper Rate Limit 130  Tachy Zones VF:  160     VT:  200     VT1:  240     Next Remote Date:  08/22/2009     Next Cardiology Appt Due:  05/11/2010 Tech Comments:  No parameter changes.  Device function normal.  Rate response blunted but adequate for the patient's level of activity.  Merlin transmissions every 3 months. ROV 1 year with Dr. [name]. Altha Harm, LPN  May 24, 2009 9:46 AM  MD  Comments:  Agree with above.  Impression & Recommendations:  Problem # 1:  AUTOMATIC IMPLANTABLE CARDIAC DEFIBRILLATOR SITU (ICD-V45.02) His device is working normally.  Will recheck in several months.  Problem # 2:  CARDIOMYOPATHY, ISCHEMIC (ICD-414.8) He denies anginal symptoms.  Continue current meds. His updated medication list for this problem includes:    Toprol Xl 25 Mg Xr24h-tab (Metoprolol succinate) .[name]... 1 by mouth once daily    Lanoxin 0.125 Mg Tabs (Digoxin) .[name]... 1 by mouth once daily    Lisinopril 20 Mg Tabs (Lisinopril) .[name]... Take one tablet by mouth daily    Coumadin 2.5 Mg Tabs (Warfarin sodium) ..... Uad    Isosorbide Mononitrate Cr 30 Mg Xr24h-tab (Isosorbide mononitrate) .[name]... 1 by mouth once daily    Furosemide 40 Mg Tabs (Furosemide) .[name]... Take one tablet by mouth two times a day  Problem # 3:  ATRIAL FIBRILLATION (ICD-427.31) His ventricular rate appears to be well controlled.  Continue meds as below. He will continue coumadin checks with Dr. Selena Batten. His updated medication list for this problem includes:    Toprol Xl 25 Mg Xr24h-tab (Metoprolol succinate) .[name]... 1 by mouth once daily    Lanoxin 0.125 Mg Tabs (Digoxin) .[name]... 1 by mouth once daily    Coumadin 2.5 Mg Tabs (Warfarin sodium) ..... Uad

## 2010-04-13 NOTE — Cardiovascular Report (Signed)
Summary: Office Visit Remote   Office Visit Remote   Imported By: Roderic Ovens 03/31/2010 11:02:02  _____________________________________________________________________  External Attachment:    Type:   Image     Comment:   External Document

## 2010-04-13 NOTE — Letter (Signed)
Summary: Remote Device Check  [employer], Main Office  [address]   [place], [place] [address]   Phone: [phone]  Fax: [phone]     March 22, 2010 MRN: [id number]   [facility] [place] [address] [place], [place]  [address]   Dear Mr. Bechtel,   Your remote transmission was recieved and reviewed by your physician.  All diagnostics were within normal limits for you.  __X___Your next transmission is scheduled for:   06-15-2010.  Please transmit at any time this day.  If you have a wireless device your transmission will be sent automatically.  Sincerely,  Vella Kohler

## 2010-05-22 LAB — URINE CULTURE
Colony Count: NO GROWTH
Culture  Setup Time: 201112270407
Culture: NO GROWTH

## 2010-05-22 LAB — COMPREHENSIVE METABOLIC PANEL
AST: 36 U/L (ref 0–37)
Albumin: 4 g/dL (ref 3.5–5.2)
Alkaline Phosphatase: 93 U/L (ref 39–117)
BUN: 12 mg/dL (ref 6–23)
Creatinine, Ser: 1.49 mg/dL (ref 0.4–1.5)
GFR calc Af Amer: 56 mL/min — ABNORMAL LOW (ref >60)
Potassium: 3.2 mEq/L — ABNORMAL LOW (ref 3.5–5.1)
Total Protein: 7.7 g/dL (ref 6.0–8.3)

## 2010-05-22 LAB — DIFFERENTIAL
Eosinophils Relative: 1 % (ref 0–5)
Lymphocytes Relative: 11 % — ABNORMAL LOW (ref 12–46)
Monocytes Absolute: 0.5 10*3/uL (ref 0.1–1.0)
Monocytes Relative: 6 % (ref 3–12)
Neutro Abs: 7.3 10*3/uL (ref 1.7–7.7)

## 2010-05-22 LAB — URINALYSIS, ROUTINE W REFLEX MICROSCOPIC
Hgb urine dipstick: NEGATIVE
Nitrite: NEGATIVE
Protein, ur: NEGATIVE mg/dL
Specific Gravity, Urine: 1.018 (ref 1.005–1.030)
Urobilinogen, UA: 0.2 mg/dL (ref 0.0–1.0)

## 2010-05-22 LAB — URINE MICROSCOPIC-ADD ON

## 2010-05-22 LAB — CBC
MCV: 93.6 fL (ref 78.0–100.0)
Platelets: 151 10*3/uL (ref 150–400)
RBC: 3.89 MIL/uL — ABNORMAL LOW (ref 4.22–5.81)
RDW: 15.4 % (ref 11.5–15.5)
WBC: 8.9 10*3/uL (ref 4.0–10.5)

## 2010-05-24 LAB — COMPREHENSIVE METABOLIC PANEL
AST: 39 U/L — ABNORMAL HIGH (ref 0–37)
BUN: 17 mg/dL (ref 6–23)
BUN: 29 mg/dL — ABNORMAL HIGH (ref 6–23)
CO2: 28 mEq/L (ref 19–32)
CO2: 28 mEq/L (ref 19–32)
Calcium: 9 mg/dL (ref 8.4–10.5)
Chloride: 101 mEq/L (ref 96–112)
Chloride: 98 mEq/L (ref 96–112)
Creatinine, Ser: 1.41 mg/dL (ref 0.4–1.5)
Creatinine, Ser: 1.65 mg/dL — ABNORMAL HIGH (ref 0.4–1.5)
GFR calc non Af Amer: 41 mL/min — ABNORMAL LOW (ref >60)
GFR calc non Af Amer: 49 mL/min — ABNORMAL LOW (ref >60)
Glucose, Bld: 104 mg/dL — ABNORMAL HIGH (ref 70–99)
Glucose, Bld: 113 mg/dL — ABNORMAL HIGH (ref 70–99)
Total Bilirubin: 1 mg/dL (ref 0.3–1.2)
Total Bilirubin: 1.3 mg/dL — ABNORMAL HIGH (ref 0.3–1.2)

## 2010-05-24 LAB — PROTIME-INR
Prothrombin Time: 21.2 seconds — ABNORMAL HIGH (ref 11.6–15.2)
Prothrombin Time: 21.8 seconds — ABNORMAL HIGH (ref 11.6–15.2)

## 2010-05-24 LAB — DIFFERENTIAL
Basophils Absolute: 0 10*3/uL (ref 0.0–0.1)
Basophils Absolute: 0 10*3/uL (ref 0.0–0.1)
Eosinophils Relative: 1 % (ref 0–5)
Eosinophils Relative: 1 % (ref 0–5)
Lymphocytes Relative: 12 % (ref 12–46)
Lymphocytes Relative: 18 % (ref 12–46)
Neutro Abs: 4 10*3/uL (ref 1.7–7.7)
Neutro Abs: 5.6 10*3/uL (ref 1.7–7.7)
Neutrophils Relative %: 73 % (ref 43–77)
Neutrophils Relative %: 79 % — ABNORMAL HIGH (ref 43–77)

## 2010-05-24 LAB — CBC
Hemoglobin: 11.4 g/dL — ABNORMAL LOW (ref 13.0–17.0)
Hemoglobin: 11.9 g/dL — ABNORMAL LOW (ref 13.0–17.0)
Hemoglobin: 12.3 g/dL — ABNORMAL LOW (ref 13.0–17.0)
MCH: 31.7 pg (ref 26.0–34.0)
MCH: 31.8 pg (ref 26.0–34.0)
MCH: 32.2 pg (ref 26.0–34.0)
MCHC: 33.3 g/dL (ref 30.0–36.0)
MCHC: 33.9 g/dL (ref 30.0–36.0)
MCV: 94.8 fL (ref 78.0–100.0)
MCV: 95 fL (ref 78.0–100.0)
MCV: 95.1 fL (ref 78.0–100.0)
RBC: 3.6 MIL/uL — ABNORMAL LOW (ref 4.22–5.81)
RBC: 3.69 MIL/uL — ABNORMAL LOW (ref 4.22–5.81)
RBC: 3.87 MIL/uL — ABNORMAL LOW (ref 4.22–5.81)

## 2010-05-24 LAB — URINALYSIS, ROUTINE W REFLEX MICROSCOPIC
Glucose, UA: NEGATIVE mg/dL
Ketones, ur: NEGATIVE mg/dL
Nitrite: NEGATIVE
Protein, ur: NEGATIVE mg/dL
Specific Gravity, Urine: 1.014 (ref 1.005–1.030)
Urobilinogen, UA: 0.2 mg/dL (ref 0.0–1.0)
pH: 5 (ref 5.0–8.0)

## 2010-05-24 LAB — BASIC METABOLIC PANEL
CO2: 31 mEq/L (ref 19–32)
Calcium: 9.3 mg/dL (ref 8.4–10.5)
Creatinine, Ser: 1.46 mg/dL (ref 0.4–1.5)
GFR calc Af Amer: 57 mL/min — ABNORMAL LOW (ref >60)
GFR calc non Af Amer: 47 mL/min — ABNORMAL LOW (ref >60)
Glucose, Bld: 95 mg/dL (ref 70–99)
Sodium: 139 mEq/L (ref 135–145)

## 2010-05-24 LAB — CARDIAC PANEL(CRET KIN+CKTOT+MB+TROPI)
CK, MB: 1.9 ng/mL (ref 0.3–4.0)
Relative Index: INVALID (ref 0.0–2.5)
Total CK: 70 U/L (ref 7–232)
Total CK: 79 U/L (ref 7–232)

## 2010-05-24 LAB — GLUCOSE, CAPILLARY: Glucose-Capillary: 75 mg/dL (ref 70–99)

## 2010-05-24 LAB — CK TOTAL AND CKMB (NOT AT ARMC)
CK, MB: 1.8 ng/mL (ref 0.3–4.0)
Relative Index: INVALID (ref 0.0–2.5)
Total CK: 71 U/L (ref 7–232)

## 2010-05-24 LAB — URINE MICROSCOPIC-ADD ON

## 2010-05-24 LAB — POCT CARDIAC MARKERS
Myoglobin, poc: 112 ng/mL (ref 12–200)
Troponin i, poc: <0.05 ng/mL (ref 0.00–0.09)

## 2010-05-24 LAB — DIGOXIN LEVEL: Digoxin Level: 0.5 ng/mL — ABNORMAL LOW (ref 0.8–2.0)

## 2010-05-24 LAB — TROPONIN I: Troponin I: 0.02 ng/mL (ref 0.00–0.06)
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
## 2010-06-01 ENCOUNTER — Other Ambulatory Visit (HOSPITAL_COMMUNITY): Payer: Self-pay

## 2010-06-02 LAB — COMPREHENSIVE METABOLIC PANEL
AST: 32 U/L (ref 0–37)
Albumin: 4.3 g/dL (ref 3.5–5.2)
Alkaline Phosphatase: 65 U/L (ref 39–117)
Chloride: 95 mEq/L — ABNORMAL LOW (ref 96–112)
GFR calc Af Amer: 46 mL/min — ABNORMAL LOW (ref >60)
Potassium: 4.1 mEq/L (ref 3.5–5.1)
Total Bilirubin: 1.5 mg/dL — ABNORMAL HIGH (ref 0.3–1.2)

## 2010-06-02 LAB — URINALYSIS, ROUTINE W REFLEX MICROSCOPIC
Bilirubin Urine: NEGATIVE
Specific Gravity, Urine: 1.016 (ref 1.005–1.030)
Urobilinogen, UA: 0.2 mg/dL (ref 0.0–1.0)

## 2010-06-02 LAB — URINE MICROSCOPIC-ADD ON

## 2010-06-02 LAB — CBC
Platelets: 165 10*3/uL (ref 150–400)
WBC: 8.6 10*3/uL (ref 4.0–10.5)

## 2010-06-02 LAB — DIFFERENTIAL
Basophils Absolute: 0 10*3/uL (ref 0.0–0.1)
Basophils Relative: 0 % (ref 0–1)
Eosinophils Relative: 1 % (ref 0–5)
Monocytes Absolute: 0.4 10*3/uL (ref 0.1–1.0)

## 2010-06-06 ENCOUNTER — Other Ambulatory Visit (HOSPITAL_COMMUNITY): Payer: Self-pay | Admitting: Radiology

## 2010-06-06 ENCOUNTER — Ambulatory Visit (HOSPITAL_COMMUNITY): Payer: PRIVATE HEALTH INSURANCE | Attending: Internal Medicine | Admitting: Radiology

## 2010-06-06 DIAGNOSIS — I059 Rheumatic mitral valve disease, unspecified: Secondary | ICD-10-CM

## 2010-06-06 DIAGNOSIS — I340 Nonrheumatic mitral (valve) insufficiency: Secondary | ICD-10-CM

## 2010-06-06 DIAGNOSIS — I509 Heart failure, unspecified: Secondary | ICD-10-CM | POA: Insufficient documentation

## 2010-06-06 DIAGNOSIS — I4891 Unspecified atrial fibrillation: Secondary | ICD-10-CM | POA: Insufficient documentation

## 2010-06-06 DIAGNOSIS — I2589 Other forms of chronic ischemic heart disease: Secondary | ICD-10-CM | POA: Insufficient documentation

## 2010-06-06 DIAGNOSIS — I519 Heart disease, unspecified: Secondary | ICD-10-CM | POA: Insufficient documentation

## 2010-06-06 MED ORDER — PERFLUTREN PROTEIN A MICROSPH IV SUSP
1.3000 mL | Freq: Once | INTRAVENOUS | Status: AC
  Administered 2010-06-06: 1.3 mL via INTRAVENOUS

## 2010-06-12 LAB — POCT CARDIAC MARKERS
CKMB, poc: 1.1 ng/mL (ref 1.0–8.0)
CKMB, poc: 1.3 ng/mL (ref 1.0–8.0)
Myoglobin, poc: 127 ng/mL (ref 12–200)
Troponin i, poc: <0.05 ng/mL (ref 0.00–0.09)

## 2010-06-12 LAB — URINE CULTURE: Colony Count: 9000

## 2010-06-12 LAB — CBC
MCHC: 34.2 g/dL (ref 30.0–36.0)
MCV: 96.8 fL (ref 78.0–100.0)
Platelets: 167 10*3/uL (ref 150–400)
RDW: 15.7 % — ABNORMAL HIGH (ref 11.5–15.5)

## 2010-06-12 LAB — COMPREHENSIVE METABOLIC PANEL
ALT: 48 U/L (ref 0–53)
Albumin: 4.4 g/dL (ref 3.5–5.2)
Alkaline Phosphatase: 68 U/L (ref 39–117)
BUN: 14 mg/dL (ref 6–23)
Calcium: 9.1 mg/dL (ref 8.4–10.5)
Potassium: 4.3 mEq/L (ref 3.5–5.1)
Sodium: 134 mEq/L — ABNORMAL LOW (ref 135–145)
Total Protein: 7.6 g/dL (ref 6.0–8.3)

## 2010-06-12 LAB — DIFFERENTIAL
Basophils Relative: 0 % (ref 0–1)
Lymphs Abs: 0.8 10*3/uL (ref 0.7–4.0)
Monocytes Absolute: 0.4 10*3/uL (ref 0.1–1.0)
Monocytes Relative: 5 % (ref 3–12)
Neutro Abs: 8.3 10*3/uL — ABNORMAL HIGH (ref 1.7–7.7)
Neutrophils Relative %: 87 % — ABNORMAL HIGH (ref 43–77)

## 2010-06-12 LAB — URINALYSIS, ROUTINE W REFLEX MICROSCOPIC
Glucose, UA: NEGATIVE mg/dL
Ketones, ur: NEGATIVE mg/dL
Protein, ur: NEGATIVE mg/dL
Urobilinogen, UA: 0.2 mg/dL (ref 0.0–1.0)

## 2010-06-12 LAB — PROTIME-INR
INR: 1.42 (ref 0.00–1.49)
Prothrombin Time: 17.2 seconds — ABNORMAL HIGH (ref 11.6–15.2)

## 2010-06-12 LAB — URINE MICROSCOPIC-ADD ON

## 2010-06-15 ENCOUNTER — Ambulatory Visit (INDEPENDENT_AMBULATORY_CARE_PROVIDER_SITE_OTHER): Payer: PRIVATE HEALTH INSURANCE | Admitting: *Deleted

## 2010-06-15 DIAGNOSIS — I509 Heart failure, unspecified: Secondary | ICD-10-CM

## 2010-06-15 DIAGNOSIS — R0989 Other specified symptoms and signs involving the circulatory and respiratory systems: Secondary | ICD-10-CM

## 2010-06-15 DIAGNOSIS — Z95810 Presence of automatic (implantable) cardiac defibrillator: Secondary | ICD-10-CM

## 2010-06-15 DIAGNOSIS — I428 Other cardiomyopathies: Secondary | ICD-10-CM

## 2010-06-16 ENCOUNTER — Other Ambulatory Visit: Payer: Self-pay

## 2010-06-17 LAB — CBC
HCT: 30.4 % — ABNORMAL LOW (ref 39.0–52.0)
HCT: 32.1 % — ABNORMAL LOW (ref 39.0–52.0)
Hemoglobin: 11.1 g/dL — ABNORMAL LOW (ref 13.0–17.0)
MCHC: 34.5 g/dL (ref 30.0–36.0)
MCHC: 34.6 g/dL (ref 30.0–36.0)
MCV: 97.2 fL (ref 78.0–100.0)
MCV: 98.2 fL (ref 78.0–100.0)
Platelets: 117 10*3/uL — ABNORMAL LOW (ref 150–400)
RBC: 3.3 MIL/uL — ABNORMAL LOW (ref 4.22–5.81)
RDW: 18 % — ABNORMAL HIGH (ref 11.5–15.5)
WBC: 8.5 10*3/uL (ref 4.0–10.5)

## 2010-06-17 LAB — BASIC METABOLIC PANEL
BUN: 15 mg/dL (ref 6–23)
CO2: 29 mEq/L (ref 19–32)
Chloride: 105 mEq/L (ref 96–112)
Creatinine, Ser: 1.22 mg/dL (ref 0.4–1.5)
Potassium: 4 mEq/L (ref 3.5–5.1)

## 2010-06-17 LAB — GLUCOSE, CAPILLARY: Glucose-Capillary: 148 mg/dL — ABNORMAL HIGH (ref 70–99)

## 2010-06-17 LAB — POCT I-STAT, CHEM 8
BUN: 17 mg/dL (ref 6–23)
Calcium, Ion: 1.12 mmol/L (ref 1.12–1.32)
Chloride: 103 mEq/L (ref 96–112)
Creatinine, Ser: 1.1 mg/dL (ref 0.4–1.5)
Glucose, Bld: 150 mg/dL — ABNORMAL HIGH (ref 70–99)
TCO2: 26 mmol/L (ref 0–100)

## 2010-06-17 LAB — PROTIME-INR
INR: 2.6 — ABNORMAL HIGH (ref 0.00–1.49)
Prothrombin Time: 25.8 seconds — ABNORMAL HIGH (ref 11.6–15.2)

## 2010-06-20 LAB — COMPREHENSIVE METABOLIC PANEL
ALT: 31 U/L (ref 0–53)
Alkaline Phosphatase: 60 U/L (ref 39–117)
BUN: 10 mg/dL (ref 6–23)
BUN: 19 mg/dL (ref 6–23)
CO2: 30 mEq/L (ref 19–32)
Calcium: 9.3 mg/dL (ref 8.4–10.5)
Chloride: 100 mEq/L (ref 96–112)
Creatinine, Ser: 1.04 mg/dL (ref 0.4–1.5)
Creatinine, Ser: 1.19 mg/dL (ref 0.4–1.5)
GFR calc non Af Amer: >60 mL/min (ref >60)
Glucose, Bld: 128 mg/dL — ABNORMAL HIGH (ref 70–99)
Glucose, Bld: 97 mg/dL (ref 70–99)
Potassium: 3.4 mEq/L — ABNORMAL LOW (ref 3.5–5.1)
Sodium: 140 mEq/L (ref 135–145)
Total Bilirubin: 1.3 mg/dL — ABNORMAL HIGH (ref 0.3–1.2)
Total Protein: 7.3 g/dL (ref 6.0–8.3)

## 2010-06-20 LAB — CBC
HCT: 33 % — ABNORMAL LOW (ref 39.0–52.0)
HCT: 36 % — ABNORMAL LOW (ref 39.0–52.0)
Hemoglobin: 12 g/dL — ABNORMAL LOW (ref 13.0–17.0)
Hemoglobin: 12.2 g/dL — ABNORMAL LOW (ref 13.0–17.0)
MCHC: 34.2 g/dL (ref 30.0–36.0)
MCHC: 35 g/dL (ref 30.0–36.0)
MCV: 94.7 fL (ref 78.0–100.0)
MCV: 96.2 fL (ref 78.0–100.0)
MCV: 96.4 fL (ref 78.0–100.0)
Platelets: 140 10*3/uL — ABNORMAL LOW (ref 150–400)
RBC: 3.48 MIL/uL — ABNORMAL LOW (ref 4.22–5.81)
RBC: 3.63 MIL/uL — ABNORMAL LOW (ref 4.22–5.81)
RDW: 16.4 % — ABNORMAL HIGH (ref 11.5–15.5)
RDW: 16.8 % — ABNORMAL HIGH (ref 11.5–15.5)
WBC: 6.5 10*3/uL (ref 4.0–10.5)

## 2010-06-20 LAB — POCT CARDIAC MARKERS
CKMB, poc: <1 ng/mL — ABNORMAL LOW (ref 1.0–8.0)
Troponin i, poc: <0.05 ng/mL (ref 0.00–0.09)

## 2010-06-20 LAB — APTT: aPTT: 36 seconds (ref 24–37)

## 2010-06-20 LAB — GLUCOSE, CAPILLARY
Glucose-Capillary: 102 mg/dL — ABNORMAL HIGH (ref 70–99)
Glucose-Capillary: 139 mg/dL — ABNORMAL HIGH (ref 70–99)
Glucose-Capillary: 147 mg/dL — ABNORMAL HIGH (ref 70–99)
Glucose-Capillary: 163 mg/dL — ABNORMAL HIGH (ref 70–99)
Glucose-Capillary: 232 mg/dL — ABNORMAL HIGH (ref 70–99)
Glucose-Capillary: 253 mg/dL — ABNORMAL HIGH (ref 70–99)
Glucose-Capillary: 96 mg/dL (ref 70–99)

## 2010-06-20 LAB — CK TOTAL AND CKMB (NOT AT ARMC)
CK, MB: 1.7 ng/mL (ref 0.3–4.0)
Relative Index: INVALID (ref 0.0–2.5)
Total CK: 66 U/L (ref 7–232)

## 2010-06-20 LAB — CULTURE, BLOOD (ROUTINE X 2)
Culture: NO GROWTH
Culture: NO GROWTH

## 2010-06-20 LAB — POCT I-STAT, CHEM 8
Calcium, Ion: 1.03 mmol/L — ABNORMAL LOW (ref 1.12–1.32)
Chloride: 101 mEq/L (ref 96–112)
Creatinine, Ser: 1.2 mg/dL (ref 0.4–1.5)
Glucose, Bld: 144 mg/dL — ABNORMAL HIGH (ref 70–99)
Potassium: 3.2 mEq/L — ABNORMAL LOW (ref 3.5–5.1)

## 2010-06-20 LAB — PROTIME-INR
INR: 2 — ABNORMAL HIGH (ref 0.00–1.49)
INR: 2 — ABNORMAL HIGH (ref 0.00–1.49)
Prothrombin Time: 23.4 seconds — ABNORMAL HIGH (ref 11.6–15.2)
Prothrombin Time: 23.4 seconds — ABNORMAL HIGH (ref 11.6–15.2)

## 2010-06-20 LAB — CARDIAC PANEL(CRET KIN+CKTOT+MB+TROPI)
CK, MB: 2.2 ng/mL (ref 0.3–4.0)
CK, MB: 3 ng/mL (ref 0.3–4.0)
Troponin I: 0.03 ng/mL (ref 0.00–0.06)
Troponin I: 0.03 ng/mL (ref 0.00–0.06)

## 2010-06-20 LAB — BASIC METABOLIC PANEL
BUN: 9 mg/dL (ref 6–23)
CO2: 28 mEq/L (ref 19–32)
Calcium: 9.1 mg/dL (ref 8.4–10.5)
Creatinine, Ser: 0.96 mg/dL (ref 0.4–1.5)
Glucose, Bld: 108 mg/dL — ABNORMAL HIGH (ref 70–99)

## 2010-06-20 LAB — DIFFERENTIAL
Basophils Relative: 0 % (ref 0–1)
Eosinophils Absolute: 0.1 10*3/uL (ref 0.0–0.7)
Eosinophils Relative: 2 % (ref 0–5)
Lymphs Abs: 1.3 10*3/uL (ref 0.7–4.0)
Monocytes Relative: 9 % (ref 3–12)

## 2010-06-20 LAB — HEMOGLOBIN A1C
Hgb A1c MFr Bld: 5.4 % (ref 4.6–6.1)
Mean Plasma Glucose: 108 mg/dL

## 2010-06-20 NOTE — Progress Notes (Signed)
icd remote check  

## 2010-06-22 LAB — APTT: aPTT: 34 seconds (ref 24–37)

## 2010-06-22 LAB — GLUCOSE, CAPILLARY
Glucose-Capillary: 110 mg/dL — ABNORMAL HIGH (ref 70–99)
Glucose-Capillary: 93 mg/dL (ref 70–99)

## 2010-06-22 LAB — BASIC METABOLIC PANEL
CO2: 29 mEq/L (ref 19–32)
Chloride: 99 mEq/L (ref 96–112)
GFR calc Af Amer: >60 mL/min (ref >60)
Potassium: 3.5 mEq/L (ref 3.5–5.1)
Sodium: 140 mEq/L (ref 135–145)

## 2010-06-22 LAB — CBC
HCT: 34.8 % — ABNORMAL LOW (ref 39.0–52.0)
Hemoglobin: 12 g/dL — ABNORMAL LOW (ref 13.0–17.0)
MCHC: 34.6 g/dL (ref 30.0–36.0)
MCV: 96.8 fL (ref 78.0–100.0)
RBC: 3.59 MIL/uL — ABNORMAL LOW (ref 4.22–5.81)

## 2010-06-22 LAB — DIFFERENTIAL
Basophils Relative: 1 % (ref 0–1)
Eosinophils Absolute: 0.1 10*3/uL (ref 0.0–0.7)
Eosinophils Relative: 2 % (ref 0–5)
Monocytes Absolute: 0.4 10*3/uL (ref 0.1–1.0)
Monocytes Relative: 7 % (ref 3–12)
Neutro Abs: 3.6 10*3/uL (ref 1.7–7.7)

## 2010-06-27 ENCOUNTER — Encounter (HOSPITAL_COMMUNITY): Payer: Self-pay | Admitting: Internal Medicine

## 2010-07-02 ENCOUNTER — Encounter: Payer: Self-pay | Admitting: *Deleted

## 2010-07-25 NOTE — Assessment & Plan Note (Signed)
[facility]                         ELECTROPHYSIOLOGY OFFICE NOTE   NAME:CAPPSAcen, Craun                         MRN:          [id number]  DATE:01/16/2007                            DOB:          05-Feb-1933    SUBJECTIVE:  Mr. Pitner was seen today in the Device Clinic for followup  of his St. Jude Promote implantable cardioverter defibrillator model  [phone].  His device was implanted on July 03, 2006, for congestive heart  failure.   Interrogation of his device demonstrates R-waves of 11.6 mV with an RV  impedance of 400 ohms and a threshold of 0.5 volts at 0.5 msec.  His  shock impedance was 42 ohms, battery voltage was greater than 3.2 volts  with a charge time of 10.5 seconds.  He was in underlying bradycardia  today in the 30's.  He is not pacemaker-dependent.  His LV impedance was  390 ohms with a threshold of 0.75 volts at 0.5 msec.  He had no episodes  of any arrhythmias since the last interrogation.  He is V-pacing greater  than 99% of the time.   Today an appointment was made for him to return to the clinic in March  2009, with Dr. [name]. [name] for his one-year anniversary, at which  point he will be set up for a remote transmission.      Gypsy Balsam, RN,BSN  Electronically Signed      [name]. [name], MD  Electronically Signed   AS/MedQ  DD: 01/20/2007  DT: 01/20/2007  Job #: [id number]

## 2010-07-25 NOTE — Discharge Summary (Signed)
[name], [name]                ACCOUNT NO.:  [id number]   MEDICAL RECORD NO.:  [id number]          PATIENT TYPE:  INP   LOCATION:  4728                         FACILITY:  [facility]   PHYSICIAN:  Elliot Cousin, M.D.    DATE OF BIRTH:  07/16/1932   DATE OF ADMISSION:  12/03/2006  DATE OF DISCHARGE:  12/06/2006                               DISCHARGE SUMMARY   DISCHARGE DIAGNOSES:  1. Acute on chronic dyspnea, etiology unclear.  However, the      differential diagnoses included congestive heart failure      exacerbation and exacerbation of chronic obstructive pulmonary      disease/interstitial lung disease.  2. History of ischemic cardiomyopathy with an ejection fraction      ranging between 10-15% by 2-D echocardiogram on June 26, 2006.  3. History of coronary artery disease, status post three-vessel      coronary artery bypass graft in May 2004.  4. History of automatic implantable cardioverter/defibrillator and      pacemaker placement secondary to syncope associated with ischemic      heart disease.  The patient also has paroxysmal atrial fibrillation      on chronic Coumadin therapy.  5. Oxygen-dependent chronic obstructive pulmonary disease and chronic      interstitial lung disease.  6. Type 2 diabetes mellitus.   DISCHARGE MEDICATIONS:  1. Prednisone 10 mg b.i.d. x1 day, then 10 mg 1 tablet once daily x1      day, then stop.  2. Coumadin 2.5 mg take 2 pills tonight and then back to your normal      schedule which is 2.5 mg daily, except 3.75 mg every Wednesday.  3. Digitek 0.125 mg daily.  4. Glipizide 5 mg daily.  5. Lasix 40 mg daily.  6. Lipitor 20 mg daily.  7. Lisinopril 10 mg daily.  8. Pepcid 20 mg daily.  9. Toprol XL 25 mg daily.  10.Allopurinol 300 mg daily.  11.Oxygen 2 liters per minute.  12.Albuterol/Atrovent nebulzers p.r.n.   DISPOSITION:  The patient was discharged to home in improved and stable  condition.  He was advised to follow up with his  primary care physician,  Dr. Aleen Campi, in 5 days.  The patient will need to have his INR and  basic metabolic panel rechecked.   PROCEDURE PERFORMED:  1. CT scan of the chest with contrast on December 04, 2006.  The      results revealed no evidence for acute pulmonary embolism, stable      mediastinal and hilar lymphadenopathy, marked multi-chamber cardiac      enlargement and mild pulmonary edema, stable chronic interstitial      lung disease and pleural thickening.  2. Chest x-ray on December 03, 2006.  The results revealed      cardiomegaly, interstitial pulmonary edema, chronic scarring of the      left base.   HISTORY OF PRESENT ILLNESS:  The patient is a 77 year old man with a  past medical history significant for coronary artery disease, ischemic  cardiomyopathy, status post AICD and pacemaker placement, paroxysmal  atrial fibrillation  and interstitial lung disease.  He presented to the  emergency department on December 04, 2006, with a chief complaint of  progressive shortness of breath.  When the patient was evaluated in the  emergency department, his chest x-ray revealed mild interstitial  pulmonary edema.  The patient was subsequently treated with 80 mg of  Lasix by the emergency department physician.  However, his BNP was  actually within normal limits at 86.  The patient was admitted for  further evaluation and management.   For additional details, please see the dictated history and physical.   HOSPITAL COURSE:  As stated above, the patient's BNP was 86, however,  the radiologist read the chest x-ray as mild interstitial edema.  The  patient was treated with 80 mg of Lasix by the emergency department  physician.  He was maintained on his usual dose of 40 mg of Lasix daily.  The patient was started on albuterol and Atrovent nebulizers for his  history of COPD and interstitial lung disease.  He was also started on a  short tapering dose of prednisone.  Over the  course of the  hospitalization, the patient's shortness of breath subsided, but it did  not completely resolve.  A D-dimer was ordered for further evaluation  and it was elevated at 0.68.  Given this finding, a CT scan of the chest  with IV contrast was ordered to rule out PE.  The CT scan of the chest  was negative for PE.  The patient was given prophylactic IV fluids with  half-normal saline with bicarbonate added at 50 mL an hour for a little  over 600 mL over a 24-hour period.  His followup BNP increased to 463.  Prior to hospital discharge, he was given one intravenous dose of 40 mg  of Lasix.  The patient's lungs were clear at the time of hospital  discharge.  He was ambulating in the hallway multiple times this  afternoon without any acute episodes of shortness of breath.  He was  advised to maintain his cardiac medications and oxygen therapy.  The  patient was also advised to use his albuterol and Atrovent nebulizers at  home as previously prescribed.   Cardiac enzymes were ordered during the hospital course and were all  within normal limits.  His TSH was also within normal limits at 3.12.  His digoxin level was slightly low at 0.5.  The patient was maintained  on all of his cardiac medications during the hospitalization.  Prior to  hospital discharge, his INR was 1.7.  The pharmacist recommended that  the patient take 5 mg of Coumadin tonight and then resume his usual  Coumadin dosing.  The patient was advised to follow up with Dr. Aleen Campi  and/or the pharmacist at [facility] in approximately  4-5 days.  The patient will need to have his PT/INR and basic metabolic  panel reassessed.  The patient's renal function is currently within  normal limits, however, given that he received intravenous contrast  during the hospital course, his renal function should be monitored  closely.      Elliot Cousin, M.D.  Electronically Signed     DF/MEDQ  D:   12/06/2006  T:  12/07/2006  Job:  [id number]   cc:   [name], MD  [name]. [name], MD

## 2010-07-25 NOTE — Discharge Summary (Signed)
[name], [name]                ACCOUNT NO.:  [id number]   MEDICAL RECORD NO.:  [id number]          PATIENT TYPE:  INP   LOCATION:  4709                         FACILITY:  [facility]   PHYSICIAN:  Pedro Earls, MD     DATE OF BIRTH:  [birth year]   DATE OF ADMISSION:  07/21/2008  DATE OF DISCHARGE:  07/24/2008                               DISCHARGE SUMMARY   DISCHARGE DIAGNOSES:  1. Chronic obstructive pulmonary disease exacerbation.  2. Diabetes.  3. Hypertension.  4. Obesity.  5. Abdominal pain.   PRIMARY CARE PHYSICIAN:  Massie Maroon, MD   CARDIOLOGY DOCTOR:  Aram Candela. Tysinger, MD   HOSPITAL COURSE:  This is a 77 year old male patient with a history of  ischemic cardiomyopathy with an EF of 10-15%, was admitted with a chief  complaint of shortness of breath.  The patient was found to be in COPD  exacerbation, was admitted to MedSurg where he was started on Avelox,  DuoNeb, and albuterol nebs.  The patient was also continued on his  Coumadin.  The patient on May 14 was complaining of some abdominal pain  for which the ultrasound was obtained, which was unremarkable.   On the day of discharge, the patient's vitals were stable and lab work  was stable as well.  The patient has mild hypokalemia for which he will  be given potassium.   DISCHARGE MEDICATIONS:  1. Lipitor 20 mg at bedtime.  2. Toprol-XL 25 mg daily.  3. Allopurinol 300 mg daily.  4. Glipizide 5 mg daily.  5. Lanoxin 0.125 mg daily.  6. Famotidine 20 mg daily.  7. Lisinopril 10 mg daily.  8. Isosorbide mononitrate 30 mg daily.  9. Coumadin 2.5 mg daily.  10.Avelox 400 mg p.o. daily for 7 days.  11.Combivent MDI 1 puff q.i.d.  12.Albuterol MDI 2 puffs q.2 h. p.r.n.  13.Lasix 40 mg once daily.  14.Potassium 20 mEq p.o. daily.   The patient will need basic metabolic panel in 1 week to assess the  patient's creatinine and the electrolytes.      Pedro Earls, MD  Electronically Signed     NS/MEDQ  D:   07/24/2008  T:  07/25/2008  Job:  [id number]   cc:   Massie Maroon, MD
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
## 2010-07-25 NOTE — H&P (Signed)
[name], [name]                ACCOUNT NO.:  [id number]   MEDICAL RECORD NO.:  [id number]          PATIENT TYPE:  INP   LOCATION:  4709                         FACILITY:  [facility]   PHYSICIAN:  Darryl D. Prime, MD    DATE OF BIRTH:  1933-01-24   DATE OF ADMISSION:  07/21/2008  DATE OF DISCHARGE:                              HISTORY & PHYSICAL   The patient is full code.   CARDIOLOGIST:  Dr. Aleen Campi.   PRIMARY CARE PHYSICIAN:  Dr. Selena Batten.   CHIEF COMPLAINT:  Shortness of breath.   HISTORY OF PRESENT ILLNESS:  [name] is a 77 year old male with a  history of ischemic cardiomyopathy.  The patient had on last  echocardiogram dated April 2008, moderately dilated left ventricle with  an ejection fraction in the range of 10-15% with diffuse left  ventricular hypokinesis.  The patient had moderate mitral regurgitation  and tricuspid regurgitation.  The patient has a history of complete  heart block, status post pacemaker.  He now has a biventricular device,  Bi-V ICD, history of diabetes, history of coronary artery disease,  status post CABG, history of COPD, and he now presents with shortness of  breath.  The patient notes at approximately 1:00 p.m. the sudden onset  of profound shortness of breath and orthopnea with associated mild  wheezing.  He denies any sick contacts or fever.  He notes a dry cough,  however, no sweats.  He notes no lower extremity edema.  No weight gain.  The patient denies any recent alcohol use.  In the emergency room, he  was given oxygen.  The patient apparently is on 2 liters of oxygen at  home, and he notes he has been taking this.  The patient at home also  took nebulizers and notes he felt some better.   PAST MEDICAL/PAST SURGICAL HISTORY:  1. As above.  2. He has a history also of previous stroke.  3. History of nephrolithiasis.  4. History of gout.  5. History of pulmonary asbestosis.   ALLERGIES:  HE IS ALLERGIC TO ASPIRIN WHICH CAUSES HIVES.   MEDICATIONS:  He is unsure of the exact doses but per last list;  1. Allopurinol 300 mg daily.  2. Digitek 0.125 mg daily.  3. Glipizide 5 mg daily.  4. Lasix 40 mg daily.  5. Lipitor 20 mg daily.  6. Lisinopril 10 mg daily.  7. Pepcid 20 mg daily.  8. Toprol XL 25 mg daily.  9. Coumadin as prescribed by his primary care physician.   SOCIAL HISTORY:  He lives by his daughter.  History of alcohol abuse  that was significant and now his daughters only let him drink one time a  month.  The patient has a history of tobacco abuse in the past.  He is a  nonsmoker currently.   FAMILY HISTORY:  His father died at the age of 32 with an MI, and he  also had colon cancer.  Three brothers also have diabetes and one sister  died with complications of diabetes.   REVIEW OF SYSTEMS:  A 14-point review  of systems negative unless stated  above.   PHYSICAL EXAMINATION:  VITAL SIGNS:  The patient's temperature is 98.5  with a blood pressure of 133/70, pulse of 61, respiratory rate 22.  Saturations are 99% on 2 liters nasal cannula.  HEENT:  Normocephalic, atraumatic.  Pupils equal, round and reactive to  light with extraocular movements being intact.  The oropharynx is dry.  NECK:  Supple with no lymphadenopathy or thyromegaly.  Jugulovenous  distention is to 8 cm of water.  LUNGS:  Clear to auscultation bilaterally, except for occasional  inspiratory wheezing.  ABDOMEN:  Obese, soft, nontender, nondistended with no  hepatosplenomegaly.  EXTREMITIES:  Showed no clubbing, cyanosis or edema.  NEUROLOGIC:  He is alert and oriented x3 with cranial nerves II-XII  grossly intact.  Strength and sensation are grossly intact.   LABORATORY DATA:  He had BNP that was mildly elevated at 176.  White  count was 6.5 with a hemoglobin of 11.5, hematocrit 33, platelets 140,  segs of 70, INR was 2, PT 24.3.  Sodium was 140 with a potassium of 3.2,  chloride 101 on an I-stat.  His BUN is 12 with creatinine of  1.2,  glucose 144.  He had a subsequent comprehensive metabolic panel showing  a sodium of 140, potassium 3.6, chloride 101, CO2 of 30, glucose 128,  BUN 10, creatinine 1.04.  The liver function tests were unremarkable  except for an AST of 40.  Chest x-ray showed chronic pleural and  parenchymal scarring, no major change compared to prior chest x-ray.  EKG showed AV pacing at 61 beats per minute.   ASSESSMENT AND PLAN:  This is a patient with a history of significant  ischemic cardiomyopathy, history of chronic obstructive pulmonary  disease, also has history of pulmonary fibrosis, who now presents with  shortness of breath.  His B-type natriuretic peptide is usually in the  1000s when he has congestive heart failure, so it seems that unlikely  that he has congestive heart failure now.  The patient will be treated  primarily for an acute pulmonary process, possibly chronic obstructive  pulmonary disease.  He will be given steroid therapy and we will place  him on nebulizers, antibiotics.  We will get blood cultures, sputum  cultures, oxygen will be continued.  We will continue his Coumadin for  now.  We will follow his potassium and see how his breathing does.  Deep  venous thrombosis and gastrointestinal prophylaxis will be ordered.      Darryl D. Prime, MD  Electronically Signed     DDP/MEDQ  D:  07/22/2008  T:  07/22/2008  Job:  [id number]

## 2010-07-25 NOTE — H&P (Signed)
[name], [name]                ACCOUNT NO.:  [id number]   MEDICAL RECORD NO.:  [id number]          PATIENT TYPE:  INP   LOCATION:  4728                         FACILITY:  [facility]   PHYSICIAN:  Hillery Aldo, M.D.   DATE OF BIRTH:  03-16-1932   DATE OF ADMISSION:  12/04/2006  DATE OF DISCHARGE:                              HISTORY & PHYSICAL   PRIMARY CARE PHYSICIAN:  Dr. Aleen Campi.   CHIEF COMPLAINT:  Dyspnea.   HISTORY OF PRESENT ILLNESS:  The patient is a 77 year old male who  presented to the hospital for evaluation of progressive shortness of  breath over the past 24 hours.  The patient states that his shortness of  breath increases with activity.  He does have a history of congestive  heart failure and ischemic cardiomyopathy and experienced some relief  with using albuterol prior to presenting to the emergency department.  Nevertheless, he continues to be short of breath.  He denies any  associated cough but does have some chest pain. He denies any fever or  chills.  The patient is extremely difficult to understand at this time  and it is unclear if there are any other associated symptoms.   PAST MEDICAL HISTORY:  1. Ischemic cardiomyopathy, multiple episodes of decompensated      congestive heart failure.  The last ejection fraction was 10-15% by      2-D echocardiogram on June 26, 2006.  2. Coronary artery disease status post three-vessel CABG in May 2004.  3. Status post AICD and pacemaker placement secondary to syncope      associated with ischemic heart disease.  4. Paroxysmal atrial fibrillation on chronic Coumadin therapy.  5. Oxygen-dependent COPD/chronic interstitial lung disease.  6. Dyslipidemia.  7. Type 2 diabetes mellitus.  8. Hypertension.  9. Gout.  10.Benign prostatic hypertrophy.  11.Gastroesophageal reflux disease per EGD in September 2006.  12.Internal hemorrhoids by colonoscopy September 2006.  13.History of nephrolithiasis.  14.Status post  right AKA.   FAMILY HISTORY:  The patient's father died in his 57s from complications  of Alzheimer's disease.  He also had peripheral vascular disease and  either prostate or colon cancer.  The patient's mother died at 19 from  metastatic cancer.  The patient has four siblings, one sister who is  deceased at age 48 from complication of diabetes.  Three brothers also  with diabetes.   SOCIAL HISTORY:  The patient is widowed and lives with one of his  daughters.  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  He has a  history of chewing tobacco and heavy alcohol use in the past.  He has  three daughters.   ALLERGIES:  ASPIRIN.   CURRENT MEDICATIONS:  1. Allopurinol 300 mg daily.  2. Digitek 0.125 mg daily.  3. Glipizide 5 mg daily.  4. Lasix 40 mg daily.  5. Lipitor 20 mg daily.  6. Lisinopril 10 mg daily.  7. Pepcid 20 mg daily.  8. Toprol XL 25 mg daily.  9. Coumadin 2.5 mg daily.   REVIEW OF SYSTEMS:  As noted in the elements of  the HPI above.  Otherwise negative.   PHYSICAL EXAM:  Temperature 97.7, pulse 65, respirations 20, blood  pressure 137/80, O2 saturation 97% on 2 liters.  GENERAL:  This is a frail elderly man who is difficult to understand.  HEENT:  Normocephalic, atraumatic.  PERRL.  EOMI.  Oropharynx reveals  dry mucous membranes.  NECK:  Supple, no thyromegaly, no lymphadenopathy, no jugular venous  distention.  CHEST:  Diminished breath sounds at the bases.  HEART:  Regular rate and rhythm.  No murmurs, rubs or gallops.  ABDOMEN:  Soft, nontender, nondistended with normoactive bowel sounds.  EXTREMITIES:  The patient has a right AKA.  There is 2+ edema to the  left lower extremity.  SKIN:  The patient has decubitus ulcers about the left foot and ankle.  These appeared appear to be full-thickness wounds.  NEUROLOGIC:  The patient has slurred speech and is difficult to  understand.  He is somewhat lethargic.  Unable to assess his level of  orientation.   Appears to move all extremities with equal strength.   DATA REVIEW:  Chest x-ray shows cardiomegaly and pulmonary interstitial  edema.  There is chronic scarring to the left lung base.   LABORATORY DATA:  White blood cell count is 5.4, hemoglobin 12,  hematocrit 36.1, platelets 140. Sodium is 141, potassium 3.5, chloride  105, bicarb 25, BUN 17, creatinine 0.9, glucose 116.  BNP is 86.  Point  of care cardiac markers are negative x1.   ASSESSMENT/PLAN:  1. Dyspnea:  The patient has multiple potential etiologies for his      dyspnea.  Although his chest radiography does show some evidence of      congestive heart failure, his normal BNP argues against this.  He      does not have any JVD or crackles on lung exam. We will therefore      continue his usual medications for his chronic heart failure and      look for an alternative source of his dyspnea.  I will check a D-      dimer and if this is elevated follow-up with a CT angiogram to rule      out pulmonary embolism.  The patient could have an aspiration      pneumonitis given his relative lethargy with the possibility of      aspiration being the triggering event.  Given his normal white      blood cell count, I would not treat this with antibiotic therapy      until this is confirmed or he develops signs of pneumonia. Will      cycle cardiac enzymes q.8 h x3 as his dyspnea may be an anginal      equivalent.  Will also check a thyroid stimulating hormone level.      Will add a swallowing evaluation.  2. Hypertension:  The patient's blood pressure is currently controlled      on his usual home medications.  3. Diabetes:  Will check the patient's hemoglobin A1c. Continue his      glipizide.  We will use sliding scale insulin on an as-needed      basis.  4. Dyslipidemia:  Will check the patient's fasting lipid panel in      morning and continue his statin therapy.  5. Gastroesophageal reflux disease:  Continue proton pump inhibitor       therapy.  6. History of COPD/chronic interstitial lung disease:  Will continue  chronic oxygen therapy.  7. Prophylaxis:  Will continue Coumadin and check the patient's PT to      ensure that he is therapeutic. This should prevent DVT.  Will also      continue proton pump inhibitor therapy for GI prophylaxis.      Hillery Aldo, M.D.  Electronically Signed     CR/MEDQ  D:  12/04/2006  T:  12/04/2006  Job:  [id number]   cc:   [name], MD

## 2010-07-25 NOTE — H&P (Signed)
NAMEANTION, [name]                ACCOUNT NO.:  [id number]   MEDICAL RECORD NO.:  [id number]          PATIENT TYPE:  INP   LOCATION:  3027                         FACILITY:  [facility]   PHYSICIAN:  Della Goo, M.D. DATE OF BIRTH:  23-Nov-1932   DATE OF ADMISSION:  03/06/2008  DATE OF DISCHARGE:                              HISTORY & PHYSICAL   PRIMARY CARE PHYSICIAN:  John R. Tysinger, M.D.   CHIEF COMPLAINT:  Abdominal pain.   HISTORY OF PRESENT ILLNESS:  This is a 77 year old male who presents to  the emergency department with complaints of diffuse abdominal pain which  he reports started 1 day ago.  The patient describes the pain as being a  burning discomfort and reports having constipation.  He rates the pain  intensity as being a 6/10 and states the pain has been coming and going.  He denies having any nausea or vomiting.  He reports taking 2 Dulcolax  tablets at home which worsened the symptoms initially, but the patient  reports while being in the emergency department the pain is beginning to  improve.  However, this is also with medication.  The patient denies  having any fevers, chills, chest pain, shortness of breath.  He does  report having some abdominal distention.   The patient was evaluated in the emergency department and did undergo a  CT scan of the abdomen and pelvis, results of which returned revealing  suspicion of a partial small bowel obstruction.  However, contrast  material was passing through the region but dilated loops of small bowel  were seen.  The patient was referred for admission secondary to his  abdominal pain and possible partial small bowel obstruction versus  ileus.   PAST MEDICAL HISTORY:  1. Significant for hypertension.  2. Coronary artery disease.  3. History of congestive heart failure syndrome.  4. Type 2 diabetes mellitus.  5. Previous cerebrovascular accident.  6. Nephrolithiasis.  7. History of a pacemaker.  8. Coronary artery  bypass grafting.  9. The patient also has a history of gout.   MEDICATIONS INCLUDE:  Allopurinol, Digitek, glipizide, Lasix, Lipitor,  lisinopril, Pepcid, Coumadin.   ALLERGIES:  ASPIRIN which cause hives.   SOCIAL HISTORY:  The patient is a nonsmoker and he reports rarely  drinking alcohol.   FAMILY HISTORY:  Noncontributory.   REVIEW OF SYSTEMS:  Pertinents are mentioned above.  All other organ  systems negative.   PHYSICAL EXAMINATION FINDINGS:  This is a 77 year old obese male in no  visible discomfort or acute distress.  VITAL SIGNS:  Are temperature 96.8, blood pressure 143/80, heart rate  63, respirations 18, O2 saturation is 97% on 2 liters nasal cannula  oxygen.  HEENT EXAMINATION:  Normocephalic, atraumatic.  Pupils equally round and  reactive to light.  Extraocular movements are intact, funduscopic  benign.  Oropharynx is clear.  NECK:  Supple, full range of motion.  No thyromegaly, adenopathy,  jugular venous distention.  CARDIOVASCULAR:  Regular rate and rhythm.  No murmurs, gallops or rubs.  LUNGS:  Clear to auscultation bilaterally.  ABDOMEN:  Obese, positive  bowel sounds, soft, nontender, nondistended.  No hepatosplenomegaly.  EXTREMITIES:  Without cyanosis, clubbing or edema.  NEUROLOGIC EXAMINATION:  Nonfocal.   LABORATORY STUDIES:  CBC with a white blood cell count 11.3, hemoglobin  13.4, hematocrit 40.9, platelets 212, neutrophils 81%, lymphocytes 11%.  Sodium 137, potassium 3.2, chloride 97, CO2 27, BUN 135, creatinine  1.45, glucose 104, total bilirubin 2.4, alkaline phosphatase 53, AST 63,  ALT 63, albumin 3.9, calcium 9.7, lipase 64.  An ultrasound on the  abdomen was performed which revealed a negative ultrasonic Murphy sign,  gallbladder reveals slight sludge, no dilated bile ducts.  Liver,  inferior vena cava and spleen are within normal limits.  The pancreas  was not visualized well.  A 6-mm stone in the lower pole of the right  kidney which  appears unchanged from previous studies.  Left kidney has a  4.4-cm cyst on the lower pole.  Left lower lobe lung effusion present.  CT scan of the abdomen and pelvis also reveal the findings mentioned  above in the history of present illness and confirmed the findings also  in the ultrasound study.  The pancreas was visualized on the CT scan and  was found to be normal.   ASSESSMENT:  A 77 year old male being admitted with:  1. Abdominal pain.  2. Ileus versus partial small bowel obstruction.  3. Constipation.  4. Type 2 diabetes mellitus.  5. Hypertension.   PLAN:  The patient will be admitted and placed on clear liquids and IV  fluids have been ordered for fluid resuscitation and maintenance.  The  patient will be placed on pain control therapy and antiemetics as  needed.  Laxative therapy and Reglan therapy will also be ordered.  The  patient will be monitored for further changes and further workup and  evaluation will ensue pending the patient's clinical course.  The  patient will be placed on IV Protonix and DVT prophylaxis as well.  His  regular medications will be further verified.      Della Goo, M.D.  Electronically Signed     HJ/MEDQ  D:  03/08/2008  T:  03/08/2008  Job:  [id number]   cc:   [name], MD

## 2010-07-25 NOTE — Discharge Summary (Signed)
[name], [name]                ACCOUNT NO.:  [id number]   MEDICAL RECORD NO.:  [id number]          PATIENT TYPE:  INP   LOCATION:  2019                         FACILITY:  [facility]   PHYSICIAN:  Darlin Priestly, MD  DATE OF BIRTH:  Jan 11, 1933   DATE OF ADMISSION:  01/26/2007  DATE OF DISCHARGE:  02/01/2007                               DISCHARGE SUMMARY   DISCHARGE DIAGNOSIS:  1. Congestive heart failure, improved at discharge.  2. Ischemic cardiomyopathy with an ejection fraction of 10-15% by      echocardiogram April 2008.  3. Chronic obstructive pulmonary disease..  4. Non-insulin-dependent diabetes.  5. Chronic atrial fibrillation, status post BiV implantable      cardioverter-defibrillator April 2008.  6. Coronary disease, coronary bypass grafting in 2004 with      catheterization April 2008. Plan is for medical therapy.  7. Lovenox-to-Coumadin crossover at discharge.  8. Treated dyslipidemia.  9. Treated hypertension.   HOSPITAL COURSE:  [name] is a 77 year old male with a history of  coronary artery bypass grafting in 2004 x3.  He was catheterized in  April 2008.  Plan was for medical therapy.  His EF at that time was 30%.  He was admitted January 26, 2007, with heart failure symptoms.  He was  initially admitted by our service and then taken over by Dr. Aleen Campi,  who is his primary cardiologist, on January 27, 2007.  The patient's  BNP on admission was 1856 with a baseline BNP of about 600-800.  The  patient's Coumadin was held for possible catheterization.  He was put on  IV heparin.  The patient denied any chest pain.  His troponins were  negative.  We considered aqua pheresis, but the patient responded to IV  diuretics.  The patient was transferred to Dr. Truett Perna service on  January 29, 2007, as Dr. Aleen Campi had to go out of town.  We continued  his diuretics.  His Coumadin was resumed.  His heparin was stopped, and  he was put on Lovenox.   We did  interrogate his device.  It appears that he is in chronic atrial  fibrillation.  His atrial lead is capped.  Plan is to send the patient  home on February 01, 2007, on Lovenox-to-Coumadin crossover.  We have  talked to Dr. Adelene Idler office regarding followup pro time.   DISCHARGE MEDICATIONS:  1. Lovenox 80 mg subcutaneously q.12 h.  2. Coumadin 2.5 mg a day.  3. Imdur 30 mg a day.  4. Allopurinol 300 mg a day.  5. Digitek 0.125 mg a day.  6. Glipizide 5 mg a day.  7. Lisinopril 10 mg a day.  8. Metoprolol 25 mg b.i.d.  9. Lasix 40 mg p.o. daily.  10.Pepcid 20 mg a day.   LABORATORY DATA:  White count 8.1, hemoglobin 15.3, hematocrit 45.9,  platelets 210.  Sodium 137, potassium 4.4, BUN 28, creatinine 1.3.  LFTs  were slightly elevated with an AST of 50 and an ALT of 105.  CK and  troponins were negative.  Cholesterol was 115 with an LDL of 72,  HDL of  22.  BNP on November 21 is 249. TSH is 2.42. Hemoglobin A1c is 6.1.  digoxin level was 0.5 on admission.   Chest x-ray shows cardiomegaly.   EKG is paced.   DISPOSITION:  The patient is discharged in stable condition.  He will  follow up with Dr. Aleen Campi. He will have a pro time next week.  Because  his LFTs were elevated, we will hold his Lipitor for now until he sees  Dr. Aleen Campi in followup.      Abelino Derrick, P.A.      Darlin Priestly, MD  Electronically Signed    LKK/MEDQ  D:  01/31/2007  T:  01/31/2007  Job:  [id number]   cc:   [name], MD

## 2010-07-25 NOTE — Op Note (Signed)
[name], [name]                ACCOUNT NO.:  [id number]   MEDICAL RECORD NO.:  [id number]          PATIENT TYPE:  AMB   LOCATION:  ENDO                         [facility]ACILITY:  [facility]   PHYSICIAN:  Georgiana Spinner, M.D.    DATE OF BIRTH:  [birth year]   DATE OF PROCEDURE:  05/05/2008  DATE OF DISCHARGE:                               OPERATIVE REPORT   PROCEDURE:  Upper endoscopy.   INDICATIONS:  Hemoccult positivity.  Gastroesophageal reflux disease.   ANESTHESIA:  Fentanyl 50 mcg, Versed 5 mg.   PROCEDURE:  With the patient mildly sedated in the left lateral  decubitus position, the Pentax videoscopic endoscope was inserted in the  mouth, passed under direct vision through the esophagus, which appeared  normal, into the stomach, fundus, body, antrum.  Duodenal bulb, second  portion of duodenum were visualized.  From this point the endoscope was  slowly withdrawn, taking circumferential views of duodenal mucosa until  the endoscope had been pulled back into the stomach, placed in  retroflexion to view the stomach from below.  The endoscope was  straightened and withdrawn, taking circumferential views of the  remaining gastric and esophageal mucosa, stopping to biopsy the distal  esophagus at the stomach because it would not open fully and I wanted to  rule out the possibility of Barrett's esophagus.  The endoscope was  withdrawn.  The patient's vital signs, pulse oximeter remained stable.  The patient tolerated procedure well, without apparent complication.   FINDINGS:  Question of Barrett's esophagus, probably normal variant,  otherwise unremarkable exam.   PLAN:  Await biopsy report.  The patient will call me for results and  follow up with me as an outpatient.  Proceed to colonoscopy as planned.           ______________________________  Georgiana Spinner, M.D.     GMO/MEDQ  D:  05/05/2008  T:  05/05/2008  Job:  [id number]

## 2010-07-25 NOTE — Discharge Summary (Signed)
NAMEAPRIL, [name]                ACCOUNT NO.:  [id number]   MEDICAL RECORD NO.:  [id number]          PATIENT TYPE:  INP   LOCATION:  3027                         FACILITY:  [facility]   PHYSICIAN:  Michelene Gardener, MD    DATE OF BIRTH:  08-Aug-1932   DATE OF ADMISSION:  03/06/2008  DATE OF DISCHARGE:  03/09/2008                               DISCHARGE SUMMARY   DISCHARGE DIAGNOSES:  1. Partial small bowel obstruction.  2. Constipation - resolved.  3. Diabetes mellitus.  4. Hypertension.  5. Coronary artery disease.  6. History of congestive heart failure.  7. Previous cerebrovascular accident.  8. History of pacemaker.  9. Status post coronary artery bypass grafting.   DISCHARGE MEDICATIONS:  1. Digoxin 0.125 mg p.o. once daily.  2. Glipizide 5 mg p.o. once daily.  3. Lasix 4 mg twice daily.  4. Lipitor 20 mg once a day.  5. Lisinopril 10 mg once a day.  6. Pepcid 20 mg once a day.  7. Toprol-XL 25 mg once a day.  8. Coumadin 2.5 mg once a day alternating with 1.5 mg.  9. Allopurinol 300 mg once a day.  10.Imdur 30 mg once a day.   CONSULTATION:  Surgical consult.   PROCEDURES:  None.   RADIOLOGY STUDIES:  1. Abdominal x-ray on March 06, 2008, showed adynamic ileus in the      abdomen with right renal calculus that had changed from prior CT.  2. Abdominal ultrasound on March 06, 2008, showed no acute      abnormalities.  3. CT scan of the abdomen on March 06, 2008, showed suspicious      findings of partial small bowel obstruction in the jejunal region      with dilated loop that contained stool-like material.  4. CT scan of the pelvis without contrast on March 06, 2008, showed      no acute findings.  5. Repeat abdominal x-ray on March 08, 2008, showed nonobstructive      gas bowel.   COURSE OF HOSPITALIZATION:  This is a 77 year old Caucasian male with  significant past medical history of multiple problems presented to the  hospital with abdominal  pain, nausea, and vomiting.  His initial  abdominal x-ray showed a partial small bowel obstruction.  The patient  was admitted to the hospital for further evaluation.  He was kept  n.p.o., was given IV fluids.  CT scan of the abdomen and pelvis was done  and showed findings consistent with partial small bowel obstruction.  Next day, the patient was feeling better.  He was started on clear  liquid diet, which was then advanced to soft diet.  The patient  tolerated the soft diet very well.  His repeat abdominal x-ray was  negative for obstruction.  This most likely secondary to constipation,  which was resolved with laxative and the patient had 2 large bowel  movements.   Otherwise, his other medical conditions remained stable during the  hospital.   ASSESSMENT TIME:  40 minutes.      Michelene Gardener, MD  Electronically Signed  NAE/MEDQ  D:  03/09/2008  T:  03/10/2008  Job:  [id number]

## 2010-07-25 NOTE — Op Note (Signed)
NAMEXADRIAN, CRAIGHEAD                ACCOUNT NO.:  [id number]   MEDICAL RECORD NO.:  [id number]          PATIENT TYPE:  OIB   LOCATION:  5018                         FACILITY:  [facility]   PHYSICIAN:  Feliberto Gottron. Turner Daniels, M.D.   DATE OF BIRTH:  Apr 12, 1932   DATE OF PROCEDURE:  05/21/2008  DATE OF DISCHARGE:                               OPERATIVE REPORT   PREOPERATIVE DIAGNOSES:  Left shoulder rotator cuff tear with  impingement syndrome and acromioclavicular joint arthritis.   POSTOPERATIVE DIAGNOSES:  Left shoulder rotator cuff tear with  impingement syndrome and acromioclavicular joint arthritis.   PROCEDURE:  Left shoulder arthroscopic anterior-inferior acromioplasty,  distal clavicle close lining and debridement of massive rotator cuff  tear, greater tuboplasty.   SURGEON:  Feliberto Gottron. Turner Daniels, MD   FIRST ASSISTANT:  Shirl Harris, PA-C   ANESTHETIC:  Left interscalene block plus endotracheal.   ESTIMATED BLOOD LOSS:  Minimal.   FLUID REPLACEMENT:  100 mL of crystalloid.   DRAINS PLACED:  None.   TOURNIQUET TIME:  None.   INDICATIONS FOR PROCEDURE:  A 76 year old man with multiple medical  problems including severe COPD, coronary artery disease, ejection  fraction of 15%, implanted defibrillator/pacemaker and diabetes.  He has  impingement syndrome, left shoulder; failed conservative treatment with  cortisone injection he cannot take an inflammatory medicine because of  his multiple medical issues.  He has a type 3 subacromial spur, probably  has a rotator cuff tear, but at age 74 with multiple medical problems.  We will not entertain repairing that.  In any event, he has failed  conservative treatment desires elective arthroscopic decompression of  his left shoulder except the fact, he always had some weakness with the  cardiac condition that is to be expected as well.   DESCRIPTION OF PROCEDURE:  The patient was identified by armband and  underwent left interscalene block  anesthetic at [facility] where  the surgery was done because of the history of multiple medical  problems.  He also received preoperative IV antibiotics and was taken to  operating room 10 where the appropriate anesthetic monitors were  reattached, general endotracheal anesthesia induced.  He was then placed  in the beach chair position with the left upper extremity prepped and  draped in usual sterile fashion from the wrist to the hemithorax.  Using  a #11 blade, we made standard portals of 1.5 cm anterior to the [facility]  joint, lateral to the junction, middle and posterior towards the  acromion and posterior to the posterolateral corner of the acromion  process.  The inflow was placed anteriorly.  The arthroscope laterally  and a 4.2 great white sucker shaver posteriorly allowing subacromial  bursectomy and debridement of what was a massive rotator cuff tear of  the supraspinatus with large amounts of tendon hanging off the greater  tuberosity as well.  After we had debrided the bursa and the massive  cuff tear, we outlined the subacromial and subclavicular spurs and set  about removing them with a 4.5 hooded vortex bur.  Hemostasis was  maintained with the ArthroCare wand,  which was a bipolar device and did  not interfere with the cardiac pacemaker.  We also performed a greater  tuboplasty with a 4.5 hooded vortex bur and then looked inside of the  shoulder where we documented a complete tear of the biceps tendon, it  was no longer in the shoulder and some degenerative labral tearing that  was removed.  Interestingly, the articular cartilage only had grade 1 to  grade 2 chondromalacia.  This was likely debrided with a 3.5 gator  sucker shaver.  Shoulder was then irrigated out with normal saline  solution.  The arthroscopic instrument was removed and a dressing of  Xeroform 4 x 4 dressing sponges.  Paper tape and a sling applied, the  patient is laid supine, awakened and taken to the  recovery room without  difficulty and he will be kept for overnight observation.      Feliberto Gottron. Turner Daniels, M.D.  Electronically Signed     FJR/MEDQ  D:  05/21/2008  T:  05/22/2008  Job:  [id number]

## 2010-07-25 NOTE — Assessment & Plan Note (Signed)
 HEALTHCARE                         ELECTROPHYSIOLOGY OFFICE NOTE   NAME:[name], [name]                         MRN:          [id number]  DATE:06/03/2007                            DOB:          Jan 02, 1933     [name] returned today for follow-up.  He is a very pleasant 77-year-  old male with an ischemic cardiomyopathy, congestive heart failure,  complete heart block who is status post BiV ICD insertion.  The patient  returns today for follow-up.  He complains of some discomfort at his ICD  insertion site but otherwise has no specific complaints.  He presently  denies chest pain otherwise.  He denies shortness of breath, though he  does note that he gets fatigued when he does feel strenuous activity.  He has received no ICD therapies.  The patient has chronic oxygen  therapy.  He has very severe LV dysfunction with an EF of 10-15%.  He  has chronic Coumadin therapy as well.   On physical exam, he is a pleasant, elderly-appearing man, somewhat  unkempt appearing, but in no acute distress.  His blood pressure was  143/84.  The pulse was 70 and regular.  The respirations were 18.  Weight was 184 pounds.  The neck revealed no jugular venous distention.  LUNGS:  Clear bilaterally to auscultation.  No wheezes, rales, or  rhonchi were present.  There was no increased work of breathing.  HEART:  Sounds were decreased somewhat throughout, however.  His cardiovascular exam revealed a regular rate and rhythm with a normal  S1-S2.  There were no obvious murmurs, rubs, or gallops.  PMI was  enlarged and laterally displaced.  ABDOMINAL EXAM:  Soft, nontender, nondistended.  No organomegaly.  EXTREMITIES:  Demonstrated trace peripheral edema bilaterally.   MEDICATIONS:  Include:  1. Allopurinol 300 a day.  2. Lipitor 20 a day.  3. Toprol 25 a day.  4. Glipizide 5 a day.  5. Digoxin 0.125 daily.  6. Lisinopril 10 a day.  7. Coumadin as directed.  8.  Isosorbide 30 mg daily.   Interrogation of his defibrillator demonstrates a [employer] Promote model  [phone] device with R-waves of 11.  The impedance was 430 in the RV, 410  in the LV, with a threshold of 0.5 at 0.5 in the RV and 0.5 at 0.5 in  the LV.  The battery voltage was 3.2 volts.  There were no intercurrent  IC therapies.  He is 98% V paced.   IMPRESSION:  1. Ischemic cardiomyopathy.  2. Congestive heart failure.  3. Complete heart block.  4. Chronic atrial fibrillation.  5. Status post BiV ICD insertion.   DISCUSSION:  Overall, [name] is stable, and his defibrillator is  working normally.  We will plan to see him back in the office in one  year for ICD follow-up.     [name]. [name], MD  Electronically Signed    GWT/MedQ  DD: 06/03/2007  DT: 06/03/2007  Job #: [id number]   cc:   [name], MD

## 2010-07-25 NOTE — Assessment & Plan Note (Signed)
[facility]                         ELECTROPHYSIOLOGY OFFICE NOTE   NAME:[name], [name]                         MRN:          [id number]  DATE:10/15/2006                            DOB:          January 04, 1933    [name] returns today for followup.  He is a very pleasant 77 year old  man with an ischemic cardiomyopathy, congestive heart failure and  complete heart block who underwent BiV upgrade back in April.  He  returns today for followup.  He denies chest pain.  He states that since  his device was placed his dyspnea has improved and overall he feels  better.  He has very minimal peripheral edema.   PHYSICAL EXAM:  He is a pleasant elderly man in no distress.  Blood  pressure 146/88, the pulse 62 and regular, respirations were 18, the  weight was 184 pounds.  NECK:  No jugular venous distention.  LUNGS:  Clear bilaterally to auscultation.  No wheezes, rales or  rhonchi.  CARDIOVASCULAR EXAM:  A regular rate and rhythm with a normal S1 S2.  EXTREMITIES:  No peripheral edema.   Interrogation of his device demonstrates a St. Jude Promote, [employer],  defibrillator with R waves of 11, the impedance 400 in the right  ventricle, 360 in the left ventricle, a threshold of 0.5 at 0.5 in the  right ventricle, 1 at 0.5 in the left ventricle.  Battery voltage was  3.2 volts.  There are no intercurrent ICD therapies.  She was 99% BiV  paced.  Today we turned on his outputs to 2.5 at 0.5 in the left  ventricle.   IMPRESSION:  1. Ischemic cardiomyopathy.  2. Congestive heart failure.  3. Atrial fibrillation.  4. Complete heart block.  5. Status post biventricular implantable cardioverter-defibrillator      insertion.   DISCUSSION:  Overall, [name] is stable and his defibrillator is  working normally.  He has had clinical improvement with upgrade to a BiV  device.  Will see him back in April of 2009 and then yearly thereafter.     [name]. [name],  MD  Electronically Signed    GWT/MedQ  DD: 10/15/2006  DT: 10/15/2006  Job #: [id number]   cc:   [name], MD

## 2010-07-25 NOTE — Op Note (Signed)
NAMEJAYCEE, [name]                ACCOUNT NO.:  [id number]   MEDICAL RECORD NO.:  [id number]          PATIENT TYPE:  AMB   LOCATION:  ENDO                         FACILITY:  [facility]   PHYSICIAN:  Georgiana Spinner, M.D.    DATE OF BIRTH:  [birth year]   DATE OF PROCEDURE:  DATE OF DISCHARGE:                               OPERATIVE REPORT   PROCEDURE:  Colonoscopy.   INDICATIONS:  Colon polyps.   ANESTHESIA:  Fentanyl 50 mcg, Versed 5 mg.   DESCRIPTION OF PROCEDURE:  With the patient mildly sedated in the left  lateral decubitus position, the Pentax videoscopic pediatric colonoscope  was inserted in the rectum and passed under direct vision with pressure  applied to reach the cecum, identified by the base of cecum and  ileocecal valve; both of which were photographed.  From this point, the  colonoscope was slowly withdrawn, taking circumferential views of the  colonic mucosa, stopping in the rectum, which appeared normal on direct  and showed hemorrhoids on retroflexed view.  The endoscope was  straightened and withdrawn.  The patient's vital signs, pulse oximeter  remained stable.  The patient tolerated the procedure well without  apparent complications.   FINDINGS:  Internal hemorrhoids, otherwise unremarkable exam.   PLAN:  See endoscopy note for further followup, and the patient will  repeat examination in 5 years.           ______________________________  Georgiana Spinner, M.D.     GMO/MEDQ  D:  05/05/2008  T:  05/05/2008  Job:  [id number]

## 2010-07-25 NOTE — Assessment & Plan Note (Signed)
[facility]                         ELECTROPHYSIOLOGY OFFICE NOTE   NAME:CAPPSArnav, Cregg                         MRN:          [id number]  DATE:06/02/2008                            DOB:          06/17/1932    Mr. Vallie returns today for followup of his biventricular pacemaker and  congestive heart failure.  The patient is a very pleasant elderly male  with a history of longstanding lung disease and COPD, on home oxygen  therapy.  He has a history of congestive heart failure and complete  heart block.  He is status post BiV pacemaker implanted back in April  2008 with a St. Jude Promote device placed at that time.  He returns  today for followup.  He denies any recent hospitalizations for  congestive heart failure.  He does have chronic dyspnea, he is on home  oxygen therapy, but otherwise has been quite stable.   CURRENT MEDICATIONS:  1. Allopurinol 300 a day.  2. Lipitor 20 a day.  3. Toprol-XL 25 daily.  4. Glipizide 5 a day.  5. Lanoxin 0.125 a day.  6. Famotidine 20 mg daily.  7. Lisinopril 10 mg daily.  8. Isosorbide mononitrate CR 30 mg daily.  9. Coumadin as directed.   PHYSICAL EXAMINATION:  GENERAL:  He is a pleasant, but chronically  diskempt-appearing elderly man in no distress.  VITAL SIGNS:  Blood pressure was 122/70, the pulse was 80 and regular,  respirations were 18, the weight was 180 pounds.  NECK:  No jugular venous distention.  LUNGS:  Clear bilaterally to auscultation.  No wheezes, rales, or  rhonchi are present.  There is no increased work of breathing.  CARDIOVASCULAR:  Regular rate and rhythm.  Normal S1 and S2.  ABDOMINAL:  Soft and nontender.  EXTREMITIES:  No edema.   Interrogation of his pacemaker demonstrates a St. Jude Promote model  3207 BiV pacemaker.  The R-waves were 11.  The impedance 490 in the RV  and 390 in the LV.  The threshold 0.5 at 0.4 in the right ventricle and  0.75 at 0.5 in the left ventricle.   Battery voltage was 3.15 volts.  Estimated longevity was approximately 6 years.  He was 99% V paced.   IMPRESSION:  1. Complete heart block.  2. Congestive heart failure.  3. Status post biventricular pacemaker insertion.  4. Chronic chronic obstructive pulmonary disease, on home oxygen.   DISCUSSION:  Overall, Mr. Saez is stable.  His pacemaker is working  normally.  His heart failure is well compensated.  His COPD is well  compensated on home oxygen.  Today, I have made no changes in his  medical therapy and not change any of his programming parameters from  his pacemaker.  I will plan to see the patient back for pacemaker  followup in 1 year.     [name]. [name], MD  Electronically Signed    GWT/MedQ  DD: 06/02/2008  DT: 06/03/2008  Job #: [id number]   cc:   [name], MD

## 2010-07-28 NOTE — Discharge Summary (Signed)
[name], [name]                      ACCOUNT NO.:  [id number]   MEDICAL RECORD NO.:  [id number]                   PATIENT TYPE:  EMS   LOCATION:  [facility]                                 FACILITY:  [facility]   PHYSICIAN:  Ara D. Tammi Klippel, M.D.                DATE OF BIRTH:  Feb 21, 1933   DATE OF ADMISSION:  03/29/2003  DATE OF DISCHARGE:  03/29/2003                                 DISCHARGE SUMMARY   PRIMARY CARE PHYSICIAN:  Dr. Aleen [name].   FINAL DIAGNOSES:  1. Atypical chest pain.  2. Coronary artery disease status post myocardial infarction.  3. Congestive heart failure secondary to ischemic cardiomyopathy.  4. Status post 3-vessel coronary artery bypass grafting, Aug 04, 2002.     a. LIMA to LAD, saphenous vein graft to diagonal 1, saphenous vein graft        to OM 1.  5. Incision and drainage of right saphenous vein graft harvest site, June,     2004.  6  Hypertension.  1. Chronic pulmonary obstructive disease on 2 liters nasal cannula.  2. Gout.  3. Obesity.  4. Gastroesophageal reflux disease.  5. Benign prostatic hypertrophy.  6. Continued alcohol abuse.  7. Diabetes mellitus type 2.   FINAL PROCEDURES:  Chest x-ray performed March 29, 2003 showing  cardiomegaly with postoperative changes noted but without acute abnormality  of the chest.   PERTINENT LABS AND OTHER TEST RESULTS:  Serial cardiac enzymes negative x5.  White blood cell count 6400, hemoglobin 12.0, hematocrit 36 with a platelet  count of 180,000.  PT 13.8, INR 1.1, PTT 33.  Sodium 137, potassium 3.7,  chloride 101, CO2 29, BUN 14, glucose 106, calcium 8.8, magnesium 1.9.  Hemoglobin A1C 5.6.  Serial cardiac enzymes negative x5.  TSH 2.441.  PSA  mildly elevated at 4.08.  Digoxin level 0.2.  Fecal occult blood was  negative.   SUMMARY OF HOSPITAL COURSE:  The patient is a very pleasant 77 year old  white male with past medical history as above who had several hours worth of  atypical chest pain when  he presented to his primary care physician's  office.  Given his cardiac history, the patient was sent to the emergency  room for further evaluation.  The patient was observed in the emergency room  on telemetry, there were no arrhythmias, he had no further episodes of chest  pain.  Serial cardiac enzymes were negative as noted above, and the patient  was discharged home in good condition in the care of his daughter.   DISCHARGE MEDICATIONS:  1. Glipizide 5 mg p.o. daily.  2. Metoprolol XL 25 mg p.o. daily.  3. Kay-Ciel 20 mEq p.o. daily.  4. Lisinopril 10 mg p.o. daily.  5. Atorvastatin 10 mg p.o. daily.  6. Furosemide 40 mg p.o. daily.  7. Allopurinol 300 mg p.o. daily.  8. Famotidine 20 mg p.o. b.i.d.  9. Digoxin 0.125 mg p.o. daily.  10.      Doxazosin 2 mg p.o. q.h.s.  11.      Nitroglycerin 0.4 mg p.r.n. chest pain.   DISCHARGE INSTRUCTIONS:  1. The patient is to take his medications as prescribed.  2. He is to have a low salt, low fat diet.  3. He is engage in low impact aerobic exercise on a daily basis.  4. He is to follow up with Dr. Aleen [name] at his next visit.  5. He is to return to the emergency room if he feels worse in any way.                                                Ara D. Tammi Klippel, M.D.    ADM/MEDQ  D:  03/30/2003  T:  03/31/2003  Job:  [id number]   cc:   [name], M.D.  [address]  [place]  [place] [address]  Fax: [phone]

## 2010-07-28 NOTE — Cardiovascular Report (Signed)
NAMEETHERIDGE, GEIL                ACCOUNT NO.:  [id number]   MEDICAL RECORD NO.:  [id number]          PATIENT TYPE:  INP   LOCATION:  4714                         FACILITY:  [facility]   PHYSICIAN:  [name], MD    DATE OF BIRTH:  11-03-1932   DATE OF PROCEDURE:  07/01/2006  DATE OF DISCHARGE:                            CARDIAC CATHETERIZATION   PROCEDURES:  1. Left heart catheterization.  2. Coronary cineangiography.  3. Vein graft cineangiographies.  4. Left internal mammary artery graft cineangiographies.  5. Left ventricular angiography.  6. Angio-Seal of the right femoral artery.   INDICATIONS FOR PROCEDURE:  This 77 year old male was admitted with  shortness of breath and chest pain and was felt to have an exacerbation  of his chronic recurrent congestive heart failure.  He stabilized  readily after admission and the question was raised to the possible need  of upgrading his ICD pacemaker to a biventricular ICD pacemaker to  improve his LV function.  Electrophysiology service saw the patient in  consult and recommended cardiac cath prior to upgrading his ICD  pacemaker to a biventricular system.   PROCEDURE IN DETAIL:  After signing an informed consent, the patient was  premedicated with 5 mg of Valium by mouth and brought to the cardiac  catheterization lab at [facility].  His right groin is prepped  and draped in a sterile fashion and anesthetized locally with 1%  lidocaine.  A 6-French introducer sheath was inserted percutaneously  into the right femoral artery.  The 6-French #4 Judkins coronary  catheters were used to make injections into the native coronary  arteries.  The right coronary catheter was used to make injections into  both vein grafts and also into the left subclavian artery visualizing  the left internal mammary arterial graft.  A 6-French pigtail catheter  was used to measure pressures in the left ventricle and aorta and to  make a mid  stream injection into the left ventricle.  The patient  tolerated the procedure well and no complications were noted.  At the  end of the procedure, the catheter and sheath were removed from the  right femoral artery and hemostasis was easily obtained with an Angio-  Seal closure system.   MEDICATIONS GIVEN:  None.   HEMODYNAMIC DATA:  There was no gradient across the aortic valve.  The  left ventricular ejection fraction was estimated at 30%.   CINEANGIOGRAPHY FINDINGS:  1. Coronary cineangiography left main coronary artery:  The ostium has      a 50-60% stenosis and the proximal segment of the left main      coronary artery has a 50-60% stenosis.  The distal segment is      normal.  2. Left anterior descending:  The proximal LAD appears normal.  The      middle segment has a moderate stenosis, which is just distal to the      takeoff of the second large diagonal branch.  This branch has a      stent, which begins in the mid LAD and extends into the  proximal      diagonal.  The distal LAD is essentially jailed by the diagonal      stent but does have antegrade flow, which is fairly normal, and      does have flow through the left internal mammary arterial graft.      The end result is fairly normal flow in the mid and distal LAD.      The first large diagonal branch has a severe proximal stenosis and      fills primarily through the vein graft.  The second large diagonal      branch has the stent in the proximal segment and appears normal and      has normal antegrade flow.  3. Circumflex coronary artery:  The circumflex in the AV groove      appears normal.  The second obtuse marginal branch has severe      proximal stenosis that has antegrade flow and distally it fills      both antegrade and through the vein graft.  4. Right coronary anatomy:  Right coronary anatomy appears normal.      The right coronary anatomy is a large, dominant vessel supplying      the posterior  descending and posterolateral circulation to the left      ventricle.   VEIN GRAFT CINEANGIOGRAPHY:  1. Diagonal vein graft:  This vein graft is normal in appearance and      has normal flow into the diagonal branch.  2. Obtuse marginal vein graft:  This vein graft also appears normal      and has normal flow through the vein graft into the obtuse marginal      branch.  3. Left internal mammary graft to the LAD:  Left internal mammary      artery is small and atretic, but does have antegrade flow into the      mid LAD.   LEFT VENTRICULAR ANGIOGRAPHY:  A mid stream injection into the left  ventricle shows an enlarged, dilated left ventricle with normal basilar  contraction.  There is severe anteroapical and inferoapical akinesia.  Ejection fraction was estimated at 30%.  There was very mild mitral  insufficiency.   FINAL DIAGNOSES:  1. Two vessel coronary artery disease with left main disease, proximal      stenosis of the obtuse marginal branch, proximal stenosis of the      diagonal branch and mid left anterior descending stenosis.  2. Normal right coronary artery.  3. Normal obtuse marginal vein graft.  4. Normal diagonal vein graft.  5. Small atretic left internal mammary arterial graft to the left      anterior descending with small antegrade flow.  6. Dilated cardiomyopathy with severe anteroapical and inferoapical      akinesia with basilar contraction intact.  7. Successful Angio-Seal to the right femoral artery.   DISPOSITION:  When compared to the prior study on Aug 03, 2002, there  has been no significant change in appearance of the coronary arteries  and grafts.  He continues to have good antegrade flow in all coronary  distributions.  There may be a slight decrease in left ventricular  function.  With these unchanged findings, his clinical condition  cardiovascularwise is very stable and he may very well be a candidate for upgrading his ICD pacer to a biventricular  ICD pacemaker device.  We  will refer back to the EP service.      [name], MD  Electronically Signed     JRT/MEDQ  D:  07/01/2006  T:  07/01/2006  Job:  [id number]   cc:   Duke Salvia, MD, [facility]  Cath Lab at [facility]

## 2010-07-28 NOTE — Discharge Summary (Signed)
NAMEBRENSON, [name] NO.:  [id number]   MEDICAL RECORD NO.:  [id number]          PATIENT TYPE:  INP   LOCATION:  2009                         FACILITY:  [facility]   PHYSICIAN:  [name], MD    DATE OF BIRTH:  July 10, 1932   DATE OF ADMISSION:  02/17/2005  DATE OF DISCHARGE:  02/21/2005                                 DISCHARGE SUMMARY   FINAL DIAGNOSES:  1.  Congestive heart failure.  2.  Chest pain, noncardiac.  3.  Coronary artery disease status post coronary artery bypass graft      surgery.  4.  Implantable cardioverter-defibrillator with VVI pacing.   REASON FOR ADMISSION:  This 77 year old male has a long history of coronary  artery disease and cardiac conduction disorder and left ventricular  dysfunction. He was admitted on February 17, 2005 from the emergency room  where he presented with chest pain and increased shortness of breath. He was  found to have signs and symptoms of congestive heart failure and his chest  pain was atypical for angina and was consistent with chest wall discomfort.  He was admitted for stabilization and evaluation. He responded rapidly in  the emergency room to diuresis.   HOSPITAL COURSE:  The patient's cardiac enzymes were negative and his  pulmonary exam improved rapidly and he became very comfortable early on in  the hospitalization. He continued to have chest wall tenderness and his  Coumadin was held because of the possibility of having a repeat cardiac cath  and also consideration was made for possible pacemaker procedure with  insertion of a bi-V pacing lead to change to bi-V pacing with the addition  of a coronary sinus lead. With atypical of his chest pain and negative  enzymes, we elected to do a radionuclear stress test which was done on  February 20, 2005 and this showed left ventricular dysfunction and dilation,  however, it did not show reversible ischemia. It did show old infarct in the  anterior and  anteroapical area. We discussed his situation with Dr. Berton Mount who recommended that we do consider him for bi-V pacing if he  continues to be difficult to manage and his congestive heart failure is  recurrent. He also recommended that we can discharge him home today and give  him another medical trial before attempting biventricular pacing. The  patient has remained stable with his telemetry being negative with a paced  rhythm and he is now stable and in improved condition. We discussed his  continued monitoring of his shortness of breath with taking extra Lasix and  also instructed him to take a double dose of Coumadin for the next 2 days  and have a follow-up on Friday in our office with Dr. Judie Grieve for his Coumadin  control.   DISPOSITION ON DISCHARGE:  Medications:  1.  Coumadin 2.5 mg q.d. with 5 mg today and tomorrow.  2.  Allopurinol 300 mg q.d.  3.  Digitek 0.125 mg q.d.  4.  Pepcid 20 mg q.d.  5.  Furosemide 40 mg q.d.  6.  Glipizide 5 mg q.d.  7.  Lisinopril 10 mg q.d.  8.  Toprol 25 mg q.d.  9.  Simvastatin 40 mg q.d.   Diet: Heart healthy diabetic diet. Activity level: As tolerated. Follow-up  appointment with Dr. Judie Grieve in our office for Coumadin control on Friday,  with Dr. Aleen Campi in the office next week for possibly starting Coreg at  that time. Further disposition: The patient has marked financial difficulty  and will not take medications unless generic or supplied by office. We will  arrange for financial backing for taking Coreg next week in the office.   CONDITION ON DISCHARGE:  Improved and stable.      [name], MD  Electronically Signed     JRT/MEDQ  D:  02/21/2005  T:  02/22/2005  Job:  [id number]

## 2010-07-28 NOTE — Op Note (Signed)
[name], CINA NO.:  [id number]   MEDICAL RECORD NO.:  [id number]          PATIENT TYPE:  OIB   LOCATION:  2899                         FACILITY:  [facility]   PHYSICIAN:  [name], MD    DATE OF BIRTH:  09/01/1932   DATE OF PROCEDURE:  12/28/2004  DATE OF DISCHARGE:                                 OPERATIVE REPORT   PACEMAKER PROCEDURE NOTE   PROCEDURE PERFORMED BY:  Dr. Aleen Campi.   DATE OF PROCEDURE:  December 28, 2004.   PROCEDURE:  Removal of pacemaker pulse generator.   INDICATIONS FOR PROCEDURE:  This 77 year old male, with a permanent  transvenous pacemaker for approximately 20 years, had a recent implantation  of a defibrillator by Dr. Sherryl Manges.  Dr. Graciela Husbands determined that there was  significant potential for interaction between the implanted devices, and,  therefore, the pacemaker needed to be explanted.  He was then scheduled for  explantation of the VVI pacemaker today.  The pacemaker had been in place  for eight years.   PROCEDURE:  After signing an informed consent, the patient was premedicated  with 5 mg of Valium by mouth and brought to the cardiac catheterization lab.  His right anterior chest and base of the neck were prepped and draped in a  sterile fashion and anesthetized locally overlying the pulse generator.  An  incision was made in this anesthetized plane with the incision being  deepened into the fibrous layer overlying the pulse generator.  The fibrous  layer was then incised, exposing the pulse generator, which was removed from  the pocket.  The pulse generator was then removed from the pacing electrode  in the usual fashion.  The wound was lavaged profusely with a kanamycin  solution.  The ventricular lead was then capped and placed within the  existing pocket.  The pocket was then closed in layers using 2-0 Vicryl.  Final skin closure was obtained with a cutaneous layer of Steri-Strips.  The  patient tolerated the  procedure well, and no complications were noted.  At  the end of the procedure, a sterile bulky dressing was applied to the wound,  and he was returned to the short-stay unit for further monitoring and 1  further dose of Ancef prior to discharge.  He was given wound care  instructions and also an appointment for followup in the office in one week  to check his wound.  He was also instructed to restart his Coumadin.      [name], MD  Electronically Signed     JRT/MEDQ  D:  12/28/2004  T:  12/28/2004  Job:  [id number]

## 2010-07-28 NOTE — Discharge Summary (Signed)
[name], [name]                      ACCOUNT NO.:  [id number]   MEDICAL RECORD NO.:  [id number]                   PATIENT TYPE:  INP   LOCATION:  2024                                 FACILITY:  [facility]   PHYSICIAN:  Kerin Perna, M.D.               DATE OF BIRTH:  April 14, 1932   DATE OF ADMISSION:  08/27/2002  DATE OF DISCHARGE:  09/08/2002                                 DISCHARGE SUMMARY   HISTORY OF PRESENT ILLNESS:  This is a 77 year old white male new onset  diabetic who underwent three vessel coronary artery disease bypass grafting  on Aug 05, 2002 for severe two vessel disease with unstable angina and  reduced left ventricular ejection fraction of 30%.  He had a saphenous vein  graft harvested from the right leg using the endoscopic technique as well as  a left mammary artery harvest for grafting to the LAD.  Vein grafts were  placed at diagonal and obtuse marginal.  The patient had some transient  atrial fibrillation postoperatively which converted to normal sinus rhythm  on digoxin.  He also had some fluid retention which responded to diuresis.  He was subsequently discharged to home.  The patient was seen in the office  twice since discharge and he developed some mild redness and cellulitis  around the incision and was placed on oral Keflex.  In the office visit  prior to this admission his sinus tract was opened up and packed and home  health nurses were performing daily wet to dry dressing changes with Nugauze  packing to the right leg.  He presented to the emergency room on the date of  admission having seen by the rehabilitation service due to a fever of 100.7,  redness, and tenderness in the right thigh more proximally.  He denied any  chest pain.  He stated his blood sugars have been adequately controlled at  home.  Denied any difficulty with his sternal examination or shortness of  breath.  He had not resumed smoking since discharge from the hospital.  While  in the emergency department blood cultures were taken and he was  placed on IV antibiotics.  A chest x-ray showed changes of COPD.  White  blood cell count was 14,000.  His creatinine was 2.6 with a BUN of 54 and  his hematocrit was 26%.  He was felt to require admission for further  evaluation and treatment including surgical incision drainage and  debridement.   PAST MEDICAL HISTORY:  1. Severe coronary artery disease as described above.  2. COPD on home oxygen.  3. Allergy to aspirin with severe rash.  4. Gout.  5. Obesity.  6. Hypertension.   MEDICATIONS:  1. Digoxin 0.125 mg daily.  2. Lasix 40 mg b.i.d.  3. Lipitor 20 mg daily.  4. Plavix 75 mg daily.  5. Aldactone 25 mg daily.  6. Allopurinol 300 mg daily.  7. Lisinopril 10  mg daily.  8. Pepcid 20 mg daily.  9. Lopressor 50 mg daily.  10.      Cardura 10 mg daily.   ALLERGIES:  ASPIRIN.   SOCIAL HISTORY:  Please see history and physical done at time of admission.   FAMILY HISTORY:  Please see history and physical done at time of admission.   REVIEW OF SYSTEMS:  Please see history and physical done at time of  admission.   PHYSICAL EXAMINATION:  Please see history and physical done at time of  admission.   HOSPITAL COURSE:  The patient was admitted to the emergency room as  described above.  He was taken to the operating room on date of admission,  underwent the following procedure:  Incision, drainage, and debridement of  the right thigh wound; application of a wound VAC system; placement of  subclavian triple lumen catheter.  This procedure was performed by Kerin Perna, M.D.  The patient tolerated procedure well.  Was taken to the  postanesthesia care unit in stable condition.  Postoperative hospital  course:  The patient has done well.  Blood cultures were negative.  He did  have an improvement in his BUN and creatinine, most recent dated September 01, 2002 of 16 and 1.1.  His wound grew out a moderate  methicillin resistant  Staph aureus and he had 10 days of intravenous vancomycin.  The patient has  been monitored aggressively by the wound management team and the [facility] has  proven to provide significant improvement in the overall appearance with a  95% level of wound granulation appreciated and minimal sloughing.  Physical  therapy has also done some debridement during this hospitalization.  The  patient has been arranged to have the VAC continued at home, but is not felt  to require the intravenous antibiotics any further.  Tentatively, it is felt  that he is stable for discharge on the morning of September 08, 2002 pending  morning round reevaluation.   DISCHARGE MEDICATIONS:  1. Digoxin 0.125 mg daily.  2. Pepcid 20 mg daily.  3. Multivitamin one daily.  4. Toprol XL 25 mg daily.  5. Allopurinol 300 mg daily.  6. Glipizide 5 mg daily.  7. Lasix 40 mg daily.  8. Combivent two puffs q.6h.  9. Lipitor 20 mg daily.  10.      Lisinopril 10 mg daily.  11.      Tylox one or two q.4-6h. as needed for pain.  12.      Lantus insulin 20 units q.h.s.  13.      Plavix 75 mg daily.   DISCHARGE INSTRUCTIONS:  The patient will receive written instructions  regarding medications, activity, diet, wound care, and follow-up.  As noted,  a home health nurse has been arranged to monitor and do the vac system  evaluation and treatment.  Follow-up will be arranged for the patient see  Evelene Croon, M.D. on July 6 at 2 p.m. at the CVTS office for Kerin Perna, M.D.   FINAL DIAGNOSES:  1. Right thigh wound infection status post endoscopic saphenous vein harvest     and incision and debridement.  2. Severe coronary artery disease.  3. Chronic obstructive pulmonary disease on home oxygen.  4. Allergies to aspirin.  5. History of gout.  6.     History of obesity.  7. History of hypertension.   CONDITION ON DISCHARGE:  Stable and improving.    [name], [name].  Kerin Perna, M.D.    Sherryll Burger  D:  09/07/2002  T:  09/07/2002  Job:  [id number]   cc:   CVTS office    cc:   CVTS office

## 2010-07-28 NOTE — Cardiovascular Report (Signed)
[place]. [facility]  Patient:    [name], [name]                         MRN: [id number] Proc. Date: 10/24/99 Adm. Date:  [id number] Attending:  Silvestre Mesi CC:         [facility] Cardiac Cath Lab   Cardiac Catheterization  PROCEDURES 1. Left heart catheterization. 2. Coronary cineangiography. 3. Left internal mammary artery cineangiography. 4. Left ventricular cineangiogram. 5. Percutaneous transluminal coronary angioplasty of the mid left anterior    descending within the prior angioplasty stented site. 6. Perclose of the right femoral artery.  INDICATION FOR PROCEDURES:  This 77 year old male has a history of two-vessel coronary artery disease and is status post angioplasty of his large dominant circumflex in June of 1999 and stenting of his mid LAD secondary to acute total closure in January of 2000.  Since then, he has done well until recently when he had an increase in angina, progressing to resting angina.  He was then rescheduled for cardiac catheterization and possible angioplasty.  DESCRIPTION OF PROCEDURE:  After signing an informed consent, the patient was premedicated with 50 mg of Benadryl intravenously and brought to the cardiac catheterization lab.  His right groin was prepped and draped in a sterile fashion and anesthetized locally with 1% lidocaine.  A 6-French introducer sheath was inserted percutaneously into the right femoral artery.  The 6-French #4 Judkins coronary catheters were used to make injections into the coronary arteries.  The right coronary catheter was used to make an injection into the left subclavian artery, visualizing the left internal mammary artery. A 6-French pigtail catheter was used to measure pressures in the left ventricle and aorta and to make a midstream injection into the left ventricle. After noting almost total occlusion of the mid LAD within the stent, with very slow antegrade flow and  99% narrowing throughout the stented area, we discussed this finding with the patient and elected to proceed with an angioplasty procedure.  The angioplasty site in the circumflex appears normal. We then exchanged the 6-French right femoral artery sheath for a 7-French sheath and after selecting a 7-French JL4 guide catheter, it was advanced over a Seldinger wire to the root of the aorta.  We then inserted a long Hi-Torque floppy guidewire and after engaging the tip of the guide catheter in the left coronary artery, the guidewire was advanced into the LAD.  After mild-to-moderate difficulty, it was advanced through the 99% stenosis within the stent and advanced into the distal LAD.  We then selected a 3.0 x 15.0-mm cutting balloon catheter which was advanced over the guidewire and after mild difficulty, it was advanced within the stent and two inflations were made, the first at 8 atmospheres for 60 seconds and the second at 10 atmospheres for 90 seconds.  After this second inflation, the cutting balloon was removed and injections again into the left circumflex artery showed an excellent angiographic result with 0% residual lesion and reestablishment of normal TIMI-3 antegrade flow.  The patient tolerated the procedure well and no complications were noted.  At the end of the procedure, the catheter and sheath were removed from the right femoral artery and hemostasis was easily obtained with a Perclose closure system.  MEDICATIONS GIVEN:  Heparin 6000 units IV, Integrilin IV by pharmacy protocol.  HEMODYNAMIC DATA:  Left ventricular pressure 101/0-15, aortic pressure 103/74 with a mean of 86.  Left ventricular  ejection fraction was estimated at 30-40%.  CINE FINDINGS Coronary cineangiography: 1. Left coronary artery:  The ostium has a 20-30% focal plaque which appears    to be unchanged from his prior study.  Remainder of the left main appears    normal. 2. Left anterior descending:   The proximal segment appears normal.  The middle    segment is the site of the prior stent in January of 2000 and now had a    critical 99% stenosis throughout the stent. 3. The distal right coronary artery had very poor TIMI-1 antegrade flow.    There was fairly good retrograde flow during injections in the right    coronary artery.  The anterolateral branches appeared normal. 4. Circumflex coronary artery:  The circumflex coronary artery now appears    normal and the prior angioplasty site appears normal. 5. Right coronary artery appears normal.  Left ventricular cineangiogram:  The left ventricular chamber size is mildly enlarged.  The overall left ventricular contractility is moderately decreased, with an ejection fraction estimated at 30-40%.  There is marked anterior and apical hypokinesia.  The mitral valve shows mild mitral insufficiency and the aortic valve appears normal.  Left internal mammary artery cines:  The left internal mammary artery appears normal.  Angioplasty cines:  Cines taken during the angioplasty procedure show proper positioning of the guidewire across the LAD lesion and a good balloon form obtained with the cutting balloon.  Final injections into the LAD post angioplasty show an excellent angiographic result, with 0% residual lesion and normal TIMI-3 antegrade flow.  FINAL DIAGNOSES 1. Critical 99% restenosis within the mid left anterior descending stent from    January 2000. 2. Normal appearance of the circumflex angioplasty site from June 1999. 3. Normal left internal mammary artery. 4. Moderate left ventricular dysfunction with anteroapical hypokinesia. 5. Successful angioplasty with cutting balloon within the mid left anterior    descending stent. 6. Successful Perclose of the right femoral artery.  DISPOSITION:  The patient was admitted to the [facility] for monitoring overnight and will anticipate discharge in the a.m. DD:  10/24/99 TD:  10/24/99 Job:  [id number]

## 2010-07-28 NOTE — Op Note (Signed)
NAMEOMARION, MINNEHAN                      ACCOUNT NO.:  [id number]   MEDICAL RECORD NO.:  [id number]                   PATIENT TYPE:  AMB   LOCATION:  ENDO                                 FACILITY:  [facility]   PHYSICIAN:  Georgiana Spinner, M.D.                 DATE OF BIRTH:  1932/10/27   DATE OF PROCEDURE:  03/26/2003  DATE OF DISCHARGE:                                 OPERATIVE REPORT   PROCEDURE:  Upper endoscopy.   INDICATIONS:  Hemoccult positivity.   ANESTHESIA:  1. Demerol 50 mg.  2. Versed 4 mg.   DESCRIPTION OF PROCEDURE:  With patient mildly sedated in the left lateral  decubitus position, the Olympus videoscopic endoscope was inserted in the  mouth, passed under direct vision through the esophagus, which appeared  normal, until the distal esophagus was approached, and there was a question  of Barrett's.  In the gastroesophageal mucosal junction appeared somewhat  irregular and was photographed and biopsied.  We entered into the stomach,  and there was a small fleck of blood seen in the fundus but no other lesions  noted.  Fundus, body, antrum, duodenal bulb, and second portion of duodenum  appeared normal.  From this point, the endoscope was slowly withdrawn,  taking circumferential views of the duodenal mucosa until the endoscope then  pulled back in the stomach, placed in retroflexion to view the stomach from  below.  The endoscope was straightened and withdrawn, taking circumferential  views of the remaining gastric and esophageal mucosa.  The patient's vital  signs and pulse oximeter remained stable.  The patient tolerated the  procedure well without apparent complications.   FINDINGS:  Raelyn Number of blood seen in the stomach.  There was slight  irregularity of the Z-line at the gastroesophageal mucosa border which was  biopsied.   PLAN:  1. Await biopsy report.  2. The patient will call me for results and follow up with me as an     outpatient.  3. Proceed to  colonoscopy as planned.                                               Georgiana Spinner, M.D.    GMO/MEDQ  D:  03/26/2003  T:  03/26/2003  Job:  [id number]

## 2010-07-28 NOTE — Assessment & Plan Note (Signed)
[facility]                         ELECTROPHYSIOLOGY OFFICE NOTE   NAME:[name], [name]                         MRN:          [id number]  DATE:07/17/2006                            DOB:          Feb 19, 1933    DEVICE CLINIC FOLLOWUP:  [name] was seen today in the device clinic  for a wound check of his newly-implanted [facility] Promote RF  defibrillator, implanted on July 03, 2006, for congestive heart  failure.   Interrogation of his device demonstrates a capped atrial port.  R-waves  of 11.6 millivolts.  His RV impedance was 410 ohms.  His RV threshold  was 0.5 volts at 0.5 milliseconds with a shock impedance of 46 ohms.  His LV impedance was 360 ohms with a threshold of 1.75 volts at 0.5  milliseconds.  His battery voltage was greater than 3.20 volts with a  charge time of 9.4 seconds.  He was in complete heart block today and is  pacemaker-dependent.  His device shows no episodes of any arrhythmia.  He is programmed with tachy zones of a VT1 zone of 160 beats per minute,  a VT zone of 200 beats per minute and a VF zone of 240 beats per minute.  He is programmed VVIR with a low rate of 60 and an upper sensor rate of  120.  He is V pacing greater than 99% of the time.  His Steri-Strips  were removed today and his wound was without redness or swelling.   The patient will return in August of 2008 to see Dr. [name] in the  device clinic.      Gypsy Balsam, RN,BSN  Electronically Signed      [name]. [name], MD  Electronically Signed   AS/MedQ  DD: 07/17/2006  DT: 07/17/2006  Job #: [id number]

## 2010-07-28 NOTE — Discharge Summary (Signed)
[name], [name]                      ACCOUNT NO.:  [id number]   MEDICAL RECORD NO.:  [id number]                   PATIENT TYPE:  INP   LOCATION:  2035                                 FACILITY:   PHYSICIAN:  Kerin Perna, M.D.               DATE OF BIRTH:  [birth year]   DATE OF ADMISSION:  08/02/2002  DATE OF DISCHARGE:  08/11/2002                                 DISCHARGE SUMMARY   CARDIOLOGIST:  Jonny Ruiz R. Aleen Campi, M.D.   FINAL DIAGNOSES:  1. Severe two-vessel coronary artery disease.  2. Class IV unstable angina.  3. History of congestive heart failure with ejection fraction of 30%.  4. Dysrhythmia with history of permanent pacemaker.  5. Hypertension.  6. Chronic obstructive pulmonary disease.  7. Gout.  8. Obesity.  9. Dyslipidemia.  10.      Volume excess postoperatively.  11.      Gastroesophageal reflux disease.  12.      Insulin-dependent diabetes.  13.      Benign prostatic hypertrophy.  14.      Chronic mild renal insufficiency.  15.      Mild postoperative anemia.   PROCEDURES:  1. Cardiac catheterization on Aug 03, 2002.  2. Preoperatively ABIs and carotid Dopplers on Aug 03, 2002.  3. CABG x3 on Aug 05, 2002, with the following grafts:  LIMA to LAD,     saphenous vein graft to obtuse marginal, saphenous vein graft to     diagonal.   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  He was readmitted with unstable angina on Aug 02, 2002.  Cardiac catheterization was done which showed severe disease, EF  30%.  CVTS was consulted. Dr. Donata Clay recommended CABG.  The patient  underwent the procedure on Aug 05, 2002 which was after routine preoperative  studies including ABIs and carotid Dopplers. ABIs showed 0.93 on the right,  0.96 on the left.   There was no significant ICA stenosis.  The patient  tolerated the CABG well.  There were no complications.  Postoperative day #1, he was weaned off dopamine.  He had some elevated  CBGs.  Insulin was started.  He also was noted to have chronic renal  insufficiency.  His creatinine was 1.8 on admission.  He received a  transfusion for anemia postoperatively.  He was weaned off the ventilator on  postoperative day #1.  He was suitable for transfer to unit 2000 on  postoperative day #3.  He was fairly volume overloaded and Lasix was  continued.  He otherwise has made good progress on unit 2000.  He is  ambulating with cardiac rehab with no difficulty on room air with good O2  saturations.  On postoperative day #5, he feels well.  He is hemodynamically  stable.  BP is 138/94.  His heart rate is 80 pacing, afebrile.  He is 11  pounds over his preoperative weight. CBG has been well controlled from 99 to  138 on Glucotrol 5 mg a day and Lantus insulin 140 units every evening.  His  creatinine has come down to 1.2.  Hemoglobin is 9.3, hematocrit 26.3.  Physical examination was satisfactory.  Wounds were healing well.  He does  have pitting edema and basilar crackles.  He has started back on his ACE  inhibitor, lisinopril, and Lipitor 20 mg a day.  He should be suitable for  discharge pending satisfactory morning rounds on August 11, 2002.   DISCHARGE MEDICATIONS:  1. Lasix 40 mg daily.  2. KCl 20 mEq daily.  3. Toprol-XL 25 mg daily.  4. Lipitor 20 mg daily.  5. Allopurinol 300 mg daily.  6. Pepcid 20 mg daily.  7. Glucotrol-XL 5 mg daily.  8. Lantus insulin 40 units subcu q.h.s.  9. Digoxin 0.125 mg daily.  10.      Doxazosin 2 mg p.o. daily.  11.      Lisinopril 10 mg p.o. daily.  12.      Percocet 5/325 once q.4-6h  p.r.n. for pain.   ALLERGIES:  ASPIRIN causes rash.   CONDITION:  Stable.   DISPOSITION:  Home.   SPECIAL INSTRUCTIONS:  He is told to avoid driving, working, heavy  lifting,  or vigorous activity.  He is told that he can shower and to use his  incentive spirometer daily.  He is to walk daily.  He is to clean his wound  gently daily with soap and water.  He is to get a chest x-ray at [facility] 1 hour  before seeing Dr. Donata Clay and to bring it with him to see Dr. Donata Clay.   FOLLOWUP:  1. Dr. Aleen Campi 2 weeks after discharge.  2. Dr. Donata Clay 3 weeks after discharge.  Office will call.     [name], P.A.                          Kerin Perna, M.D.    [name]  D:  08/10/2002  T:  08/10/2002  Job:  [id number]   cc:   Aram Candela. Aleen Campi, M.D.  [address]  [place]  [place] [address]  Fax: [phone]

## 2010-07-28 NOTE — Cardiovascular Report (Signed)
[name], [name]                      ACCOUNT NO.:  [id number]   MEDICAL RECORD NO.:  [id number]                   PATIENT TYPE:  INP   LOCATION:  3702                                 FACILITY:  [facility]   PHYSICIAN:  Aram Candela. Tysinger, M.D.              DATE OF BIRTH:  1933-01-10   DATE OF PROCEDURE:  08/03/2002  DATE OF DISCHARGE:                              CARDIAC CATHETERIZATION   PROCEDURES:  1. Left heart catheterization.  2. Coronary cineangiography.  3. Left internal mammary artery cineangiography.  4. Left ventricular cineangiography.  5. Abdominal aortogram.  6. Perclose of the right femoral artery.   INDICATION FOR PROCEDURES:  This 77 year old male has a long history of  cardiac conduction disorder with a pacemaker over 20 years and coronary  artery disease status post angioplasty in 1999 of his circumflex and  angiography with stent placement in his LAD diagonal in 2001.  His distal  LAD had a severe stenosis which was jailed by the LAD diagonal stent and we  were unable to cannulate this lesion and this has been a stable condition  since 2001.  He now returns with increasing angina at rest requiring  increased amounts of nitroglycerin and hospitalization. With this past  history, he was scheduled for repeat cardiac catheterization.  He was noted  to have a mildly increased creatinine and BUN which is new from his prior  hospitalizations and was given increased fluids and Mucomyst prior to the  cath.   PROCEDURE:  After signing an informed consent, the patient was premedicated  with 5 mg of Valium by mouth and brought to the cardiac catheterization lab.  His right groin was prepped and draped in sterile fashion and anesthetized  locally with 1% lidocaine.  A 6 French introducer sheath is inserted  percutaneously into the right femoral artery.  6 [place] #4 Judkins coronary  catheters were used to make injections into the native coronary arteries.  The right  coronary catheter was used to make a midstream injection into the  left subclavian artery visualizing the left internal mammary.  A 6 French  pigtail catheter was used to measures pressure in the left ventricle and  aorta and to make midstream injections into the left ventricle and abdominal  aorta.  The patient tolerated the procedure well and no complications were  noted.  At the end of the procedure the catheter and sheath were removed  from the right femoral artery and hemostasis was easily obtained with a  Perclose closure system.   MEDICATIONS GIVEN:  None.   HEMODYNAMIC DATA:  1. Left ventricular pressure 106/5-11.  2. Aortic pressure 106/67 with a mean of 84.  3. Left ventricular ejection fraction was estimated at 30-40%.   CINE FINDINGS:  1. Coronary cineangiography left coronary artery:  The ostium of the left     main coronary artery is abnormal with a 50-60% stenosis prior to  nitroglycerin and an 80%  stenosis of the ostium following intracoronary     nitroglycerin.  The remainder of the left main and bifurcation appears     normal.  2. Left anterior descending:  The proximal and middle segment appears     normal.  The skated area in the middle segment which extends into the     third diagonal branch is normal without evidence of re-stenosis.  There     is normal flow into this third diagonal branch.  The first and second     diagonal branches are normal.  The mid LAD extending from the stent is     jailed by the stent and has a 50-60% stenosis and has slow TIMI-2     antegrade flow.  This is unchanged from his prior study in 2002.  3. Circumflex coronary artery:  The circumflex is normal appearing and the     angioplasty site in the middle segment also is normal without evidence of     restenosis.  There is normal antegrade flow.  4. Right coronary artery.  The right coronary is a large vessel and appears     normal.  This is unchanged from his prior studies.  5.  Left internal mammary artery appears normal.   LEFT VENTRICULAR CINEANGIOGRAM:  The left ventricular chamber size is  moderately enlarged.  There is severe anteroapical hypokinesia.  The  anterobasilar segment has normal contractility.  The inferior basilar  segment also is normal.  The ejection fraction was estimated at 30-40%.   ABDOMINAL AORTOGRAM:  The abdominal aorta and renal arteries appear normal.   FINAL DIAGNOSES:  1. Left main coronary artery disease with ostial stenosis of 60-80%.  2. Unchanged appearance of the left anterior descending and circumflex.  3. Normal right coronary artery.  4. Norma left internal mammary artery.  5. Left ventricular dysfunction.  Ejection fraction 30-40% with severe     anteroapical hypokinesia.  6. Normal abdominal aorta and renal arteries.  7. Successful Perclose of the right femoral artery.   DISPOSITION:  Will continue hydration with IV fluids and give two further  doses of Mucomyst.  We will recheck his renal function in the a.m. We will  also ask CVTS to see for possible coronary artery bypass graft surgery.                                                 John R. Aleen Campi, M.D.    JRT/MEDQ  D:  08/03/2002  T:  08/03/2002  Job:  [id number]

## 2010-07-28 NOTE — H&P (Signed)
[name], [name]                      ACCOUNT NO.:  [id number]   MEDICAL RECORD NO.:  [id number]                   PATIENT TYPE:  INP   LOCATION:  1825                                 FACILITY:  [facility]   PHYSICIAN:  Soyla Murphy. Renne Crigler, M.D.               DATE OF BIRTH:  11-Apr-1932   DATE OF ADMISSION:  04/07/2002  DATE OF DISCHARGE:                                HISTORY & PHYSICAL   CHIEF COMPLAINT:  [name] is a 77 year old married white resident of  [place] who comes in with a chief complaint of chest pain.   HISTORY OF PRESENT ILLNESS:  He states he has had this about 12 hours off  and on. Actually, he says the last pain was about 2 a.m. Some occurs with  exercise, some occurs at rest. It is in the left lower chest area,  occasionally it is in the back. It tends to be more sharp than dull, lasts  five minutes or less. He has also been more short of breath than usual. He  denies any swelling in his legs. Shortness of breath has been going for the  last couple of days. He does have a history of MI and CHF. He is not  familiar with his medications whatsoever.   ALLERGIES:  ASPIRIN does ?   MEDICATIONS:  Unknown.   PERSONAL HISTORY:  Native of [place]. He is retired. He is not very  active. His wife, he states, is terminally ill, expected to live two to six  months. He drinks one pint of bourbon every two to three days. There is rare  cigar use.   FAMILY HISTORY:  Mother is alive. Father died age 55 of prostate cancer. One  sister has had diabetes.   REVIEW OF SYMPTOMS:  There is no numbness, tingling, weakness, seizures,  syncope, or headache. He is dizzy if he stands up suddenly. There is no  change in hearing or vision. No runny nose, sore throat, sinus pain, or ear  pain. He is a little hard of hearing, actually this is variable. He has no  trouble swallowing, nausea, vomiting, diarrhea, constipation, melena, or  hematochezia. He also has a bit of a cough,  and he has had some recent  wheezing as well. Chest pain has been described above. No palpitations. He  does have a pacemaker. No abdominal pain. No low back pain. No dysuria,  hematuria, urgency, frequency, or dysuria. He has occasional left shoulder  ache. Feels better if he lies by his left side rather than on his right side  for some reason. No other muscle or joint aches. No rashes, fevers, or  chills are noted. He is in good spirits.   PHYSICAL EXAMINATION:  GENERAL:  He is overweight in no acute distress. His  temperature is 97.0, pulse 80, respirations 24, BP 149/82 and 109/72. He is  on 1.5 liters nasal cannula as he at home chronically.  SKIN:  Few nevi. There is no cervical, supraclavicular, axillary, or  inguinal adenopathy.  HEENT:  Normocephalic, atraumatic. PERRL. Normal lids and conjunctivae. TMs  are ear canals appear normal. His tongue and posterior pharynx appear  normal.  NECK:  Supple. There is no thyromegaly or any neck masses.  CARDIOVASCULAR:  No carotid bruits, 2+ equal carotids and radial pulses.  Trace dorsalis pedis pulses. Regular rate and rhythm. No murmurs, rubs, or  gallops.  LUNGS:  Clear to auscultation and percussion.  ABDOMEN:  Protuberant. Nontender bowel sounds present. No organomegaly. No  masses.  GENITALIA:  Normal uncircumcised male genitalia with no hernias.  RECTAL:  Tone is normal. Stool is brown and heme negative. No rectal masses.  No tenderness.  EXTREMITIES:  There is no edema on examination. No cyanosis. No clubbing. He  is no particular distress at this time.   ASSESSMENT:  1. Congestive heart failure exacerbation. Unclear what is going on, if he     has unstable angina as cause, medication noncompliance is possible, or     simply worsening of his cardiac function. Will check echocardiogram.     __________ his situation in the morning.  2. Atypical chest pain, rule out myocardial infarction.  3. Coronary artery disease with  multiple interventions in the past and     ischemic cardiomyopathy, last ejection fraction 32%.  4. Pacemaker for bradycardia for gallop.  5. History of gastroesophageal reflux disease.  6. Chronic obstructive pulmonary disease, question industrial induced.  7. Nephrolithiasis.  8. History of elevated liver enzymes.  9. Aspirin allergy.  10.      Lightheadedness with standing. Will check this also.  11.      Minimal anemia, hemoglobin 12.7, with a normal MCV. Will check iron     and TIBC levels.  12.      Hypokalemia, potassium 3.2. Will treat this.                                               Soyla Murphy. Renne Crigler, M.D.    WDP/MEDQ  D:  04/07/2002  T:  04/07/2002  Job:  [id number]

## 2010-07-28 NOTE — H&P (Signed)
[name], [name]                      ACCOUNT NO.:  [id number]   MEDICAL RECORD NO.:  [id number]                   PATIENT TYPE:  INP   LOCATION:  2024                                 FACILITY:  [facility]   PHYSICIAN:  Mikey Bussing, M.D.           DATE OF BIRTH:  [birth year]   DATE OF ADMISSION:  08/27/2002  DATE OF DISCHARGE:                                HISTORY & PHYSICAL   REASON FOR ADMISSION:  Right thigh cellulitis and abscess with fever.   CHIEF COMPLAINT:  Right thigh pain.   HISTORY OF PRESENT ILLNESS:  [name] is a 77 year old white male, new  onset diabetic, who underwent three vessel coronary artery bypass grafting  on Aug 05, 2002, for severe two vessel disease with unstable angina and  reduced LVEF of 30%.  He had saphenous vein harvested from the right leg  using the endoscopic technique as well as left mammary artery harvest for  grafting to the LAD.  Vein grafts were placed to the diagonal and obtuse  marginal.  The patient had some transient atrial fibrillation  postoperatively which converted to sinus rhythm on digoxin.  He had some  fluid retention which responded to diuresis.  He was subsequently discharged  to home.  I have seen the patient in the office twice since his discharge  and he developed some mild redness and cellulitis around the knee incision  and was placed on oral Keflex.  In the last office visit, his sinus tract  was opened up and packed and home health nurses were performing daily wet-to-  dry dressing changes with NuGauze packing to the right leg.  He presents to  the emergency department today after having been sent by the rehab service  due to a fever of 100.7, redness and tenderness in the right thigh more  proximally.  He denies any chest pain.  He states his blood sugars have been  adequately controlled at home.  He denies any difficulty with his sternal  examination or shortness of breath.  He has not resumed smoking  since  discharge from the hospital.   While in the emergency department blood cultures were taken and he was  placed on IV antibiotics.  A chest x-ray showed changes of COPD.  His white  count was 14,000.  His creatinine was 2.6 with a BUN of 54 and his  hematocrit was 26%.   PAST MEDICAL HISTORY:  1. Severe coronary artery disease status post CABG x3 May 2004.  2. COPD on home oxygen.  3. Allergy to aspirin with severe rash.  4. Gout.  5. Obesity.  6. Hypertension.   HOME MEDICATIONS:  1. Digoxin 0.125 mg a day.  2. Lasix 40 mg b.i.d.  3. Lipitor 20 mg daily.  4. Plavix 75 mg a day.  5. Aldactone 25 mg a day.  6. Allopurinol 300 mg a day.  7. Lisinopril 10 mg daily.  8.  Pepcid 20 mg a day.  9. Lopressor 50 mg daily.  10.      Cardura 10 mg daily.   ALLERGIES:  ASPIRIN.   SOCIAL HISTORY:  The patient lives alone and is being cared for by his  daughters, [name] and Lajas Lions.  He is recently widowed.   HABITS:  He is not currently smoking or using alcohol.  He use to drink  heavily.   FAMILY HISTORY:  Negative.   REVIEW OF SYSTEMS:  Since his release from the hospital, his appetite has  been poor.  He does not know if he has lost weight.  He states he had a  fever of over 101 last night.  He remains on his home oxygen and has mild  dyspnea on exertion.  He denies any problems with constipation or change in  bowel habits.  He denies any difficulty passing urine or burning with  urination.  He has had some mild swelling of his right lower extremity  following saphenous vein harvest.  He denies any neurologic symptoms.  Review of systems is otherwise negative.   PHYSICAL EXAMINATION:  GENERAL APPEARANCE:  An elderly poorly kempt white  male who is obviously feeling poorly and is slightly tremulous.  He states  his blood sugar earlier this morning was in the 40 to 50 range and he was  given some orange juice.  VITAL SIGNS:  Temperature 100.7, blood pressure 125/60, pulse 84  and  regular, respiratory rate 18, room air saturation 95% on two liters.  HEENT:  Normocephalic. EOM full.  NECK:  No masses or JVD.  CHEST:  Sternal incision is stable and appears to be healing well.  LUNGS:  Scattered rhonchi.  CARDIOVASCULAR:  Regular rhythm without S3 gallop or murmur.  ABDOMEN:  Soft and nontender without mass.  EXTREMITIES:  There is 1+ edema.  No clubbing or cyanosis.  The right thigh  is acutely inflamed, red, warm, tender and with fluctuance in the mid thigh.  The sinus tract that was opened and packed in the office is draining some  clear watery fluid.  Peripheral pulses are not palpable.  RECTAL:  Examination is deferred.  NEUROLOGIC:  The patient is alert and oriented x3 and he moves all  extremities to command.   LABORATORY DATA:  His chest x-ray shows no active infiltrate or edema.   His white count is 14,000, hematocrit 26%.  Creatinine is now elevated at  2.6.  His potassium was 5.8.   IMPRESSION:  Cellulitis with probable endoscopic tunnel infection and  abscess.   PLAN:  The patient will be covered with antibiotics, holstered and taken to  the operating room for exploration of his right thigh incision, incision and  drainage and possible application of the wound VAC system.  I have discussed  this procedure with the patient as well as his two daughters who were  present during the examination in the emergency  department.  They understand the indication for this procedure due to his  significant and poorly controlled infection as well as the risks of  bleeding, blood transfusion and further problems with infection.  They  understand and he agrees to proceed with the operation under informed  consent.  Mikey Bussing, M.D.    PV/MEDQ  D:  08/27/2002  T:  08/28/2002  Job:  [id number]

## 2010-07-28 NOTE — Op Note (Signed)
NAMEJOBIN, MONTELONGO                      ACCOUNT NO.:  [id number]   MEDICAL RECORD NO.:  [id number]                   PATIENT TYPE:  INP   LOCATION:  3742                                 FACILITY:  [facility]   PHYSICIAN:  Duke Salvia, M.D.               DATE OF BIRTH:  [birth year]   DATE OF PROCEDURE:  09/15/2003  DATE OF DISCHARGE:                                 OPERATIVE REPORT   PREOPERATIVE DIAGNOSIS:  Recurrent syncope in the setting of ischemic heart  disease, prior bypass surgery, depressed left ventricular function,  previously-implanted pacemaker, and permanent atrial fibrillation.   POSTOPERATIVE DIAGNOSIS:  Recurrent syncope in the setting of ischemic heart  disease, prior bypass surgery, depressed left ventricular function,  previously-implanted pacemaker, and permanent atrial fibrillation.   PROCEDURE:  Single-chamber defibrillator implantation with intraoperative  defibrillation threshold testing and pacemaker reprogramming.   Following the obtaining of informed consent, the patient was brought to the  electrophysiology laboratory and placed on the fluoroscopic table in the  supine position.  After routine prep and drape of the left upper chest,  lidocaine was infiltrated in the prepectoral subclavicular region.  An  incision was made and carried down to the layer of the prepectoral fascia  using electrocautery and sharp dissection.  A pocket was formed similarly.  Hemostasis was obtained.   Thereafter attention was turned to gaining access to the extrathoracic left  subclavian vein, which was accomplished without difficulty without the  aspiration of air or puncture of the artery.  A guidewire was placed and  retained and a 0 silk suture was placed in a figure-of-eight fashion and  allowed to hang loosely.  Subsequently a tear-away introducer sheath was  placed, through which was then placed a [employer] Riata 1581 65-cm active-  fixation dual-coil defibrillator  lead, serial number [id number].  Under  fluoroscopic guidance it was manipulated to the right ventricular apex,  where the bipolar R-wave was 18.7 mV with a pacing impedance of 639 Ohms, a  pacing threshold of 0.6 V at 0.5 msec with a current at threshold of 0.7 mA.  There was no diaphragmatic pacing at 10 volts.   In the LAO care was made to make sure that the bipole was separated from the  previously-implanted unipolar lead.  With these acceptable parameters  recorded, the lead was then attached to a [employer] [phone] ICD, serial  number [id number].  Through the device the bipolar R-wave was 12 mV with a  pacing impedance of 520 Ohms, a pacing threshold of 0.5 V at 0.5 msec.   With these acceptable parameters recorded, defibrillation threshold testing  was undertaken.   Ventricular fibrillation was induced via the T-wave shock.  After a duration  of seven seconds, a 20-joule shock was delivered through a measured  resistance of 40 Ohms, terminating ventricular fibrillation and restoring a  paced rhythm.   After a wait of five to  six minutes,ventricular fibrillation was reinduced  via the T-wave shock.  After a total duration of 5.5 seconds, a 15-joule  shock was delivered through a measured resistance of 40 Ohms, terminating  ventricular fibrillation and restoring a ventricularly-paced rhythm.  There  was no evidence of conversion to sinus rhythm.   With these acceptable parameters recorded, the system was implanted.  The  pocket was copiously irrigated with antibiotic-containing saline solution,  hemostasis was assured, and the lead and the pulse generator were placed in  the pocket and secured to the prepectoral fascia.  The wound was washed,  dried, and a Benzoin and Steri-Strip dressing was applied.  Needle counts,  sponge counts, and instrument counts were correct at the end of the  procedure according to the staff.   The patient tolerated the procedure without apparent  complication.   The patient's previously-implanted pacemaker was programmed in the OVO mode  with outputs minimized.   The device was not explanted.   The patient's device is programmed at 160/200/240.                                               Duke Salvia, M.D.    SCK/MEDQ  D:  09/15/2003  T:  09/16/2003  Job:  [id number]   cc:   Aram Candela. Aleen Campi, M.D.  [address]  [place]  [place] [address]  Fax: [phone]   [facility]   Electrophysiology Laboratory

## 2010-07-28 NOTE — H&P (Signed)
NAMEEILEEN, [name]                ACCOUNT NO.:  [id number]   MEDICAL RECORD NO.:  [id number]          PATIENT TYPE:  INP   LOCATION:  4737                         FACILITY:  [facility]   PHYSICIAN:  [name], [name]    DATE OF BIRTH:  10-Apr-1932   DATE OF ADMISSION:  05/31/2006  DATE OF DISCHARGE:                              HISTORY & PHYSICAL   PRIMARY CARE PHYSICIAN:  Dr. Charolette Child.   CHIEF COMPLAINT:  Shortness of breath.   HISTORY OF PRESENTING ILLNESS:  [name] is a very pleasant 77 year old  male well-known to our service with known history of ischemic  cardiomyopathy, status post three-vessel CABG on Aug 05, 2002 for severe  two-vessel disease and unstable angina with poor LV function, who  presented within the past 24 hours complaining of orthopnea and  shortness of breath.  He is oxygen dependent for underlying COPD felt to  be related to occupational exposure.  He stated that he could not sleep  all night and was up, could not lie flat and was discovered in the  emergency department have an elevated BNP of 1226 and radiographic  evidence suggestive of pulmonary vascular congestion.  His last  discharge weight was 182 pounds back in March 2007; today, his weight is  185.1.  He does have a known poor ejection fraction of 20%.  He is  status post AICD placement and pacer.  He is being admitted for further  treatment of decompensated congestive heart failure.   PAST MEDICAL HISTORY:  As noted above, significant for coronary artery  disease, status post CABG by Dr. Donata Clay in May 2004, history of  chronic lung disease, on 24-hour oxygen per nasal cannula, felt to be  secondary cotton dust exposure, history of gout, obesity, aspirin  allergy, status post pacemaker placement secondary to bradycardia and  also history of AICD placed in the setting of syncope and coronary  disease.   MEDICATIONS AT HOME:  Unfortunately, [name], though very compliant  with his  medications, is unclear on the dosages and specifics regarding  his medications.  He continues to have his medications filled at  [facility] and they fill his pill boxes for him.  He follows  with Dr. Virgina Evener as well at [facility] and Dr.  Aleen Campi.  [name] is reachable at [phone] in the morning  during hours of operation.   MEDICATIONS OBTAINED FROM THE EMERGENCY ROOM ASSESSMENT:  1. Allopurinol 300 mg nightly.  2. Digitek 0.125 mg daily.  3. Glipizide 5 mg daily.  4. Lasix 40 mg daily.  In addition, [name] adjusts this medication      on his own.  He does state that he takes this about 3 days of the      week, does not typically take this every day.  5. Lipitor 20 mg daily.  6. Lisinopril 10 mg daily.  7. Pepcid 20 mg daily.  8. Toprol-XL 25 mg daily.  9. Coumadin as adjusted by Dr. Virgina Evener at [facility].  ALLERGIES:  ASPIRIN.   SOCIAL HISTORY:  [name] at the prompting of his primary care  cardiologist has now moved in with his daughter; his daughter can be  reached at [phone] or [phone].  He denies current tobacco abuse or  alcohol abuse.   FAMILY HISTORY:  Noncontributory.   REVIEW OF SYSTEMS:  He has been doing well in his usual state of health  with the exception of the past 24 hours, where he has had shortness of  breath.  He has had no complaint of chest pain otherwise, simply  shortness of breath and inability to sleep due to shortness of breath.  Further review is unremarkable.   PHYSICAL EXAMINATION:  VITAL SIGNS:  In the emergency department, his  blood pressure is 137/88, temperature 97.0, heart rate 72, respirations  22, O2 saturation 97%.  GENERAL:  [name] is very pleasant, awake and alert, in no acute  distress.  HEENT:  Head is normocephalic, atraumatic.  Extraocular muscles are  intact.  NECK:  Supple and nontender.  No palpable thyromegaly or mass.  CARDIOVASCULAR:  Exam  reveals normal S1 and S2.  LUNGS:  Clear breath sounds, just did diminished at bases.  There is no  dullness to percussion.  ABDOMEN:  Soft and nontender without rebound or guarding.  LOWER EXTREMITIES:  Reveal very minimal ankle edema and no calf  tenderness.  NEUROLOGIC:  Exam revealed him to be alert and oriented, in no acute  distress.   LABORATORY AND ACCESSORY CLINICAL DATA:  His BNP as noted above was  1226.  WBC 7, hemoglobin 11.2, platelet count 160,000, MCV of 92.  Sodium 139, potassium 3.6, BUN 50, creatinine 1.1, glucose 81.  Point-of-  care marker at 2120 was negative.   His chest x-ray revealed cardiac enlargement and pulmonary vascular  congestion and a pacer/AICD in place.   ASSESSMENT:  1. Congestive heart failure decompensation (current weight 151.1,      previous weight 182 pounds 1 year ago).  2. History of ischemic cardiomyopathy with poor ejection fraction,      status post automatic implantable cardioverter-defibrillator, with      no reports of deployment.  3. Diabetes mellitus.  4. Hypertension.  5. Gout.  6. Hypokalemia.   PLAN:  At this time, [name] will be admitted for management of his  decompensation of heart failure.  I suspect that he should respond  appropriately to a couple of doses of IV diuresis.  I will follow his  clinical course and continue his medications as he was on at home.      [name], [name]  Electronically Signed     ESS/MEDQ  D:  05/31/2006  T:  06/01/2006  Job:  [id number]   cc:   [name], MD

## 2010-07-28 NOTE — H&P (Signed)
[name], [name]                      ACCOUNT NO.:  [id number]   MEDICAL RECORD NO.:  [id number]                   PATIENT TYPE:  OBV   LOCATION:  1824                                 FACILITY:  [facility]   PHYSICIAN:  [name], [name]                 DATE OF BIRTH:  23-Sep-1932   DATE OF ADMISSION:  12/24/2002  DATE OF DISCHARGE:                                HISTORY & PHYSICAL   PRIMARY CARE PHYSICIAN:  Dr. Charolette Child   Admission is for [facility].   CHIEF COMPLAINT:  Acute shortness of breath.   HISTORY OF PRESENT ILLNESS:  This is a 77 year old Caucasian male who  recently underwent three-vessel CABG on Aug 05, 2002 for severe two-vessel  disease and unstable angina with poor LV function, who presents to the  emergency department approximately 10 a.m. with a complaint of acute onset  shortness of breath and orthopnea.  This episode woke him up from sleep and  continues to be present in the emergency department.  He is oxygen dependent  at home with two liters via nasal cannula 24 hours a day for underlying COPD  secondary to what is suspected to be occupational exposure at [facility].  He does not smoke.  Drinks only occasionally.  He denied any productive  cough, however, he does have a chronic persistent dry cough.  He denied any  chest pain.  He tried some nebulizer treatments at home that helped but the  duration was very short.  He states that with frequent nebulizer treatments  at home he develops severe shakes.  For this reason he refrains from using  these frequently.  In any event in the emergency department he was  evaluated.  His oxygenation was found to be adequate with an O2 saturation  of 98% on his baseline two liters. There is no clinical evidence of  congestive heart failure based on chest x-ray and oxygenation.  On physical  exam however his BNP is elevated at 191.   PAST MEDICAL HISTORY:  1. Significant for coronary artery disease  status post coronary artery     bypass by Dr. Kathlee Nations Trigt, III for left main stenosis.  Complications     of the above including vein graft site infection.  2. Hypertension.  3. Chronic lung disease on 24 hours a day home oxygen at two liters/minute,     felt to be secondary to cotton dust exposure.  4. Gout.  5. Obesity.  6. ASPIRIN ALLERGY.  7. Status post pacemaker placement secondary to bradycardia.  8. Gastroesophageal reflux disease.  9. Prostatomegaly.   PAST SURGICAL HISTORY:  Include those as noted above as well as colonoscopy  January 08, 2000 which revealed sigmoid diverticulosis.   MEDICATIONS:  1. Furosemide 40 mg p.o. b.i.d.  2. Allopurinol 300 mg p.o. daily.  3. Doxazocin 2 mg p.o. daily.  4. Lisinopril 10 mg p.o. daily.  5. Pepcid 20 mg p.o. daily.  6. Plavix 75 mg p.o. daily.  7. Spironolactone 25 mg p.o. daily.  8. Metoprolol XL 50 mg daily.  9. Digitek 0.125 mg p.o. daily.  (These medications were obtained from [facility], phone [phone].  Patient states that they fill his pill boxes for him, he does not  know his medications, he just takes these as he is directed by them.   ALLERGIES:  ASPIRIN.   SOCIAL HISTORY:  The patient currently lives alone, he is widowed.  Was  formerly a heavy drinker, however, he only drinks occasionally.  Denies  tobacco use.  Former Education officer, environmental here.   FAMILY HISTORY:  Significant for father who died at age 52 with prostate  cancer, and as well, diabetes noted in the family.   REVIEW OF SYSTEMS:  The patient states his general health has been about  baseline up until around 1 a.m. this morning.  Denies fever, chills or  sweats.  Denies chest pain.  Denies nausea, vomiting or diarrhea.  Denies  abdominal pain, denies hematochezia or melena.  Denies dysuria.  He does  report worsening lower extremity edema.   PHYSICAL EXAMINATION:  VITAL SIGNS:  Blood pressure is 120/60, pulse rate is  80, respirations 20  to 24, oxygenation 98% on two liters.  GENERAL:  He was awake, alert, in no acute distress.  Seated forward  complaining of shortness of breath upon supine positioning.  Otherwise he  was enjoying his meal without discomfort.  CARDIAC:  Revealed normal S1, S2.  LUNGS:  Revealed some diminished breath sounds at the bases.  ABDOMEN:  Obese, nontender.  EXTREMITIES:  Did reveal bipedal edema and were cool.  Pulses were palpable  and symmetrical bilaterally.   LABORATORY DATA:  WBC is 6600, hemoglobin 11.8, hematocrit 24, MCV of 90,  platelet count 174,000. Point of cares - myoglobin of 227, CK-MB 3.9,  troponin I of 0.05.  Digoxin level was 0.6.  Sodium of 140, potassium 3.6,  chloride 102, CO2 of 26, BUN 9, creatinine 1.0, glucose 163, calcium 9.3,  albumin 3.6, AST 24, ALT 23, alkaline phosphatase 69, total bilirubin 0.6.  Chest x-ray unable to be located at this time however was reported as no  acute disease.  EKG revealed a paced rhythm.   IMPRESSION AND PLAN:  1. Acute dyspnea with underlying coronary artery disease.  2. Some evidence of congestive heart failure based on the BNP, clinically     not profound.  3. Coronary artery disease.  4. Hypertension.  5. Anemia.  6. Chronic pulmonary obstructive disease.   PLAN:  We are going to admit [name] to a telemetry floor secondary to  acute dyspnea.  We will rule out ischemia as an etiology, follow his cardiac  markers, administer gentle diuresis for him with IV Lasix and follow him  clinically.  Replete his electrolytes as needed and continue his home  medications as before.  Employ deep venous thrombosis prophylaxis with low  molecular weight heparin.  Will follow his I&O's, obtain his admission and  discharge weights.  Administer bronchodilators as needed and discharge him  if he is stable over the next 24 hours.                                                [name],  [name]   ESS/MEDQ  D:  12/24/2002  T:  12/24/2002   Job:  [id number]   cc:   Aram Candela. Aleen Campi, M.D.  [address]  [place]  [place] [address]  Fax: [phone]

## 2010-07-28 NOTE — Discharge Summary (Signed)
NAMECHANTRY, HEADEN                ACCOUNT NO.:  [id number]   MEDICAL RECORD NO.:  [id number]          PATIENT TYPE:  INP   LOCATION:  4714                         FACILITY:  [facility]   PHYSICIAN:  Michaelyn Barter, M.D. DATE OF BIRTH:  [birth year]   DATE OF ADMISSION:  06/26/2006  DATE OF DISCHARGE:  07/04/2006                               DISCHARGE SUMMARY   This is a final discharge summary that will only outline those events  that occurred during the time frame of April23,2008 up until  April24,2008. For events that occurred prior to April23, please see the  discharge summary that was dictated by Dr. Hillery Aldo on  [phone].   Problem 1.  Ischemic cardiomyopathy with recurrent congestive heart  failure on April23,2008.  [facility] performed the removal of a  previously implanted ICD and inserted a new biventricular ICD.  The  procedure was performed by Dr. [name].  [name].  During the last 2 days  of the patient's hospitalization he denied having any shortness of  breath or chest pain. He appeared to diurese well with Lasix.   Problem 2.  Chronic atrial fibrillation.  The patient's heart rate  remained well-controlled during the last 2 days of his hospitalization.   Problem 3.  Diabetes mellitus.  The patient's sugars were slightly  elevated.  It was decided however that the patient can follow up with  his primary care physician regarding this.   Problem 4.  Dyslipidemia. Zocor was provided to the patient during this  hospitalization. By the day of discharge the patient indicated that he  had no new complaints and he requested to be discharged from the  hospital. On the date of discharge his temperature was 96.9, heart rate  65, respirations 18, blood pressure 125/74 and O2 sat was 98% on room  air.  His white blood cell count was 6.5, hemoglobin 12.0, hematocrit  36.8, platelets 142.  Sodium 133, potassium 4.5, chloride 100, CO2 25,  BUN 31, creatinine 1.19,  glucose 113, calcium 8.8. The decision was made  to discharge the patient from the hospital.   DISCHARGE MEDICATIONS:  1. The patient was discharged home on Keflex 500 mg. The patient was      told to take 1 tablet 1/2 hour before breakfast, before lunch, and      before dinner.  2. Allopurinol 300 mg p.o. daily.  3. Digoxin 0.125 mg p.o. daily.  4. Lasix 80 mg p.o. daily.  5. Lisinopril 10 mg p.o. daily.  6. Toprol XL 25 mg p.o. daily.  7. K-Dur 20 mEq b.i.d.  8. Lipitor 20 mg once a day.   He was told to follow-up with [facility] on Wednesday May7 at  10:40 a.m.      Michaelyn Barter, M.D.  Electronically Signed     OR/MEDQ  D:  07/31/2006  T:  07/31/2006  Job:  [id number]

## 2010-07-28 NOTE — Discharge Summary (Signed)
[facility]  Patient:    [name], [name]                   MRN: [id number] Adm. Date:  [id number] Disc. Date: [id number] Attending:  Silvestre Mesi                           Discharge Summary  ADMISSION DIAGNOSES: 1. Unstable angina. 2. Coronary artery disease.  FINAL DIAGNOSES: 1. Unstable angina 2. Coronary artery disease. 3. Severe stenosis left anterior descending.  INDICATIONS FOR ADMISSION:  This 77 year old male with a history of coronary artery disease was admitted on January 20, 2000, after having severe midanterior and left chest pain.  He had history of restenosis of a mid-LAD stent and total occlusion of his distal LAD on prior admissions, and he was felt to have an unstable state because of his propensity to have restenosis within his prior angioplasty sites.  His physical exam on admission was abnormal with moderate obesity and pacemaker placed in his right upper chest. His electrocardiogram showed a paced rhythm, and his initial enzymes were negative.  HOSPITAL COURSE:  After admission the patient continued to have several short episodes of chest pain, and his telemetry showed a stable rhythm that was ventricularly paced.  Chest x-ray showed mild cardiomegaly and a ventricular pacemaker which was unchanged from prior x-ray.  With continued chest pain at bed rest in the hospital, it was felt that he needed a repeat cardiac cath to assess his coronary status.  He then underwent cardiac cath on January 22, 2000, finding his distal left anterior descending was now open; however, it was essentially cut off by a stent that was placed in the mid-LAD and extending into a large diagonal branch.  The continuation of the LAD and interventricular groove was essentially trapped by the stent.  This extension was totally closed on his prior cardiac cath and was now the probable source of his unstable angina.  His left ventricle showed  moderate dysfunction with an ejection fraction of 40%.  After discussing our finding with the patient, we elected to proceed with an attempt to open up the distal portion of his LAD by attempting to advance a wire through the side of the stent and into the distal LAD.  Multiple attempts with multiple wires were unsuccessful at being able to pass a wire safely into the distal LAD through the side of the LAD diagonal stent.  At the end of the procedure the LAD was still patent, but with a critical 95% stenosis and slow antegrade flow.  With the inability to instrument this stenosis by passing a wire through the side of this stent into the vessel, we will be treating this medically with appropriate changes in his antianginal agents.  He continued to have short episodes of chest pain after the cath and was maintained in the hospital for an additional two days until he then was pain free for 24 hours and discharged on January 24, 2000, in stable improved condition.  The finding and plans for medical treatment were fully explained, and we made arrangements to follow up in the office in two weeks.  DISCHARGE MEDICATIONS: 1. Lasix 20 mg q.d. 2. Spironolactone 25 mg q.d. 3. Combivent inhaler q.i.d. 4. Allopurinol 300 mg q.d. 5. Lipitor 20 mg q.d. 6. Zestril 10 mg q.d. 7. Pepcid AC b.i.d. 8. Plavix 75 mg q.d.  DISCHARGE INSTRUCTIONS:  Activity:  As tolerated.  Diet:  Low cholesterol low fat.  DISCHARGE FOLLOWUP:  Follow-up appointment in one to two weeks with Dr. Aleen Campi in the office.  CONDITION ON DISCHARGE:  Improved and stable. DD:  02/01/00 TD:  02/03/00 Job: [id number]

## 2010-07-28 NOTE — H&P (Signed)
[name], [name]                ACCOUNT NO.:  [id number]   MEDICAL RECORD NO.:  [id number]          PATIENT TYPE:  INP   LOCATION:                               FACILITY:  [facility]   PHYSICIAN:  Elliot Cousin, M.D.    DATE OF BIRTH:  05-07-1932   DATE OF ADMISSION:  06/26/2006  DATE OF DISCHARGE:                              HISTORY & PHYSICAL   PRIMARY CARE PHYSICIAN:  Dr. Charolette Child.   CHIEF COMPLAINT:  Shortness of breath.   HISTORY OF PRESENT ILLNESS:  The patient is a 77 year old man with a  past medical history significant for ischemic cardiomyopathy with an  ejection fraction of 20%, coronary artery disease, status post three-  vessel CABG, paroxysmal atrial fibrillation, and type 2 diabetes  mellitus, who presents to the emergency department with a chief  complaint of shortness of breath.  The patient says that he is  chronically short of breath; however, his shortness of breath became  significantly worse at approximately 5 p.m. yesterday.  He treated  himself with an extra Lasix and a nebulizer treatment.  However, the  shortness of breath did not resolve.  He also has associated chest  stinging.  The stinging radiates from right to left over his chest.  He has not had any increase in swelling of his legs or his abdomen.  He  has not had worsening shortness of breath when he lies flat.  He has not  had any recent fever, chills, upper respiratory infection symptoms,  cough, nausea, vomiting or diarrhea.  He says that he has not missed any  doses of Lasix.  His daughter states that she does not cook with salt.   During the evaluation in the emergency department, the patient is noted  to be mildly hypertensive with a blood pressure of 151/90.  His  respiratory rate on arrival was 32 beats per minute.  His oxygen  saturation was 98% on 2 L of nasal cannula oxygen.  His chest x-ray  reveals cardiomegaly with pulmonary edema.  His BNP is 1113.  Prior to  hospital discharge  on June 04, 2006, his BNP was 142.  The patient will  be admitted for further evaluation and management.   PAST MEDICAL HISTORY:  1. Ischemic cardiomyopathy with multiple episodes of decompensated      congestive heart failure (systolic dysfunction).  His ejection      fraction is 20% per 2-D echocardiogram in March 2007.  He was      recently hospitalized March 21 through June 03, 2006 for      decompensated congestive heart failure.  2. Coronary artery disease, status post three-vessel CABG in May 2004.  3. Status post AICD and pacemaker placement secondary to syncope      associated with ischemic heart disease.  4. Paroxysmal atrial fibrillation, on chronic Coumadin therapy.  5. Oxygen-dependent COPD/chronic interstitial lung disease.  6. Dyslipidemia.  7. Type 2 diabetes mellitus.  8. Hypertension.  9. Gout.  10.Obesity.  11.BPH.  12.Gastroesophageal reflux disease per EGD in September 2006 (by Dr.  Virginia Rochester).  13.Internal hemorrhoids per colonoscopy in September 2006 (by Dr.      Virginia Rochester).  14.Aspirin allergy.   MEDICATIONS:  1. Allopurinol 300 mg daily.  2. Digoxin 125 mcg daily.  3. Glipizide 5 mg daily (the patient does not take daily).  4. Lasix 40 mg b.i.d.  5. Lipitor 20 mg daily.  6. Lisinopril 10 mg daily.  7. Pepcid 20 mg daily.  8. Toprol-XL 25 mg daily.  9. K-Dur 20 mEq daily.  10.Imdur 30 mg daily.  11.Coumadin 2.5 mg daily, except 3 mg every Wednesday.  12.Oxygen  2 liters per minute.   ALLERGIES:  The patient has an allergy to ASPIRIN.   SOCIAL HISTORY:  The patient is widowed.  He lives with his daughter  Lajoyce Corners in [place], [place].  Ms. Debroah Loop is the patient's  healthcare power of attorney. He has 2 other children. The patient  denies tobacco use.  He drinks alcohol occasionally.  Approximately 3-4  days ago, he had several drinks over the course of the weekend.  He is  retired from [employer] work.   FAMILY HISTORY:  His father died  in his 56s of complications of  Alzheimer's dementia and colon cancer.  His mother died at 67 years of  age secondary to metastatic cancer.   REVIEW OF SYSTEMS:  As above in the history of present illness.   PHYSICAL EXAMINATION:  VITAL SIGNS:  Temperature 98.3, blood pressure  151/90, pulse 67, respiratory rate 32, repeated at 20, oxygen saturation  98% on 2 L of nasal cannula oxygen.  GENERAL:  The patient is a pleasant 77 year old overweight Caucasian  male who is currently sitting up in bed in improved condition.  HEENT:  Head is normocephalic and nontraumatic.  Pupils are equal, round  and reactive to light.  Extraocular movements are intact.  Conjunctivae  are clear.  Sclerae are white.  Nasal mucosa is mildly dry.  No sinus  tenderness.  Oropharynx reveals fair dentition.  Mucous membranes are  moist.  No posterior exudates or erythema.  NECK:  Supple.  No adenopathy, no thyromegaly and no bruit.  Mild JVD  bilaterally.  HEART:  S1 and S2 with a soft systolic murmur.  LUNGS:  Decreased breath sounds in the bases and a few bilateral upper  lobe fine crackles.  Breathing is currently nonlabored.  CHEST WALL:  Well-healed sternotomy scar, nontender. No rash.  ABDOMEN:  Obese.  Positive bowel sounds.  Soft, nontender and non-  distended.  No hepatosplenomegaly.  No masses palpated.  EXTREMITIES:  Pedal pulses barely palpable.  No pretibial edema and no  pedal edema.  NEUROLOGIC:  The patient is alert and oriented x3.  Cranial nerves II-  XII are intact.  Strength is 5/5 throughout.  Sensation is intact.   ADMISSION LABORATORY AND ACCESSORY CLINICAL DATA:  Chest x-ray results  are above.   EKG reveals electronic ventricular pacemaker with a heart rate of 67  beats per minute.   BNP 1113.  PT 39.5, INR 3.5.  Digoxin level 0.4.  Creatinine 1.2, BUN  19.  Sodium 141, potassium 3.6, chloride 107, glucose 118, bicarbonate 23.  CK-MB 1.3, troponin I less than 0.05, myoglobin  68.8.   ASSESSMENT:  1. Decompensated congestive heart failure (exacerbation of systolic      dysfunction).  The patient was just recently discharged on June 04, 2006 for the same.  He has not had another 2-D echocardiogram  in greater than 1 year.  He has known severe systolic dysfunction      with an ejection fraction of 20% per the 2-D echocardiogram in      March 2007.  The patient may simply require a higher dose of      outpatient Lasix.  His BNP is currently 1113; prior to hospital      discharge in March, his BNP was 142.  2. Ischemic cardiomyopathy/coronary artery disease.  The patient does      have some chest discomfort without the typical associated cardiac      symptoms except dyspnea.  He describes a stinging type of      discomfort that radiates across his chest.  Myocardial infarction      will need to be ruled out.  3. History of paroxysmal atrial fibrillation, on chronic Coumadin      therapy.  The patient's INR is slightly supratherapeutic.  4. Type 2 diabetes mellitus.  The patient's venous glucose is well      within normal limits.  5. Oxygen-dependent chronic obstructive pulmonary disease/chronic      interstitial lung disease.  6. Hypertension.   PLAN:  1. The patient was given 80 mg of Lasix IV by the emergency department      physician.  He has diuresed approximately 1500 mL of urine.  2. We will continue diuretic therapy with Lasix 80 mg q.12 h.  3. We will add nitroglycerin drip and hold the Imdur for now.  4. We will continue cardiac medications including Coumadin.  5. For further evaluation, we will check cardiac enzymes q.8 h. x3 and      we will order another 2-D echocardiogram to assess for interval      changes compared with the 2-D echocardiogram in March 2007.      Elliot Cousin, M.D.  Electronically Signed     DF/MEDQ  D:  06/26/2006  T:  06/26/2006  Job:  [id number]   cc:   [name], MD

## 2010-07-28 NOTE — Discharge Summary (Signed)
[facility]  Patient:    [name]                          MRN: [id number] Adm. Date:  [id number] Disc. Date: [id number] Attending:  Doneta Public Dictator:   Kinnie Scales Reed Breech, M.D.                           Discharge Summary  PRIMARY CARE PHYSICIAN:  Dr. Marny Lowenstein at [facility].  DISCHARGE DIAGNOSES:  1. Congestive heart failure exacerbation.  2. Dyspnea.  PAST MEDICAL HISTORY:  1. CHF, LVEF 25-30%.  2. Hypokalemia.  3. Status post pacer with intermittent left-sided chest pain since insertion.  4. Hypercholesterolemia.  5. COPD, on home O2.  6. Alcohol abuse, no history of withdrawal symptoms.  7. CAD, status post PTCA May 1999, circumflex mid, PTCA one stent LAD mid June     1999, repeat PTCA LAD January 2000.  8. BPH.  9. History of GI bleed secondary to aspirin intake. 10. History of ARF secondary to dehydration. 11. Obesity. 12. History of GER gastritis.  DISCHARGE MEDICATIONS:  1. Allopurinol 300 mg 1 p.o. q.d.  2. Pepcid 30 mg 1 p.o. b.i.d.  3. Lasix 40 mg 1 p.o. q.a.m.  4. Lipitor 20 mg 1 p.o. q.a.m.  5. Plavix 75 mg 1 p.o. q.a.m.  6. Albuterol MDI inhaler p.r.n.  7. Flovent 44 mcg inhaler take 2 puffs b.i.d.  8. Combivent inhaler 2 puffs q.i.d.  9. Zestril 10 mg 1 p.o. q.a.m. 10. Spironolactone 25 mg 1 p.o. q.d.  FOLLOW-UP:  The patient will see his primary care physician at [facility] on April 17, 1999.  HISTORY OF PRESENT ILLNESS:  This 77 year old white male with significant history of CAD, CHF, COPD, ventricular pacer, and alcohol abuse complains of shortness f breath for one week.  It was progressive over the past several days prior to admission, and has primary complaint of orthopnea.  He reports he feels fluid building up, has been feeling sweaty, and having edema in the lower extremities. He also complains of chest pain, sharp and intermittent with no radiation, no nausea, diaphoresis, in a band-like  distribution across the anterior chest. The patient will be admitted and evaluated.  HOSPITAL COURSE: #1 - The patient was given IV Lasix x 1, and continued on home medicine. Overnight, the patient reports easier breathing, and he was ambulating this afternoon on room air, and his oxygen saturations were 98-99%.  He reports symptoms are better.  A call to his physicians office revealed he has not taken his inhalers properly, and should be on albuterol p.r.n., Combivent q.i.d., and Flovent b.i.d.  #2 - CARDIOVASCULAR:  The patient has had this history of intermittent chest pain. A troponin I was performed in the emergency room, and the results were less than 0.03.  No further evaluation was done at admission.  Prior to discharge, nurse reports an episode where the patient had an irregular heartbeat and the pacer did not kick in.  The patient was asymptomatic with this, not short of breath, no chest pain, and laughed when this happened.  Rhythm strip at that time demonstrated a  period of nonpacing.  #3 - HYPOKALEMIA:  The patient had a potassium level of 3.1 on admission.  He was given K-Dur p.o. to replace this.  #4 - CHRONIC OBSTRUCTIVE PULMONARY DISEASE:  The patient is on an unusual outpatient  regimen, with clarification from primary M.D.  Appropriate changes were made with albuterol, Flovent, Combivent.  He currently does not take p.o. steroids.  #5 - INCREASED FLUID INTAKE:  The patient reportedly drinks one-half pint to one pint of whiskey a week.  He also reportedly drinks unquantifiable amount of water during the day.  He was not observed doing this during hospitalization.  #6 - OBESITY:  The patient is grossly overweight, and would benefit from diet therapy and counseling.  ADMISSION LABORATORY DATA:  WBC 5.6, hemoglobin 13.2, hematocrit 36.8, platelets 154.  BMP normal except for potassium 3.1.  ABG showed pH of 7.48, pCO2 of 3.84, and pO2 of 59.  This was  done on room air.  ABG was not repeated on 2 L of oxygen.  EKG showed ventricular pacing which was regular.  No acute changes.  Chest x-ray showed increased fluffiness on the left.  Bilateral blunting of diaphragms.  PLAN:  We are planning discharge today, with follow-up with primary care physician. Prior to leaving, I will check an EKG and BMP to ensure he is doing well cardiac-wise.  I restarted his spironolactone, and it is unclear why this was discontinued as this has been shown to be beneficial in people with as significant CHF as he apparently has.  Beta blocker with his lung disease and requiring oxygen not likely to be of benefit and, likely, contraindicated.  The patient could stand to undergo to a cardiac stress test. DD:  04/13/99 TD:  04/14/99 Job: [id number]

## 2010-07-28 NOTE — Op Note (Signed)
[name], [name]                      ACCOUNT NO.:  [id number]   MEDICAL RECORD NO.:  [id number]                   PATIENT TYPE:  INP   LOCATION:  2306                                 FACILITY:  [facility]   PHYSICIAN:  Kerin Perna III, M.D.           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   POSTOPERATIVE DIAGNOSIS:  Class IV unstable angina with significant left  main stenosis, ejection fraction 30%.   OPERATION:  Coronary artery bypass grafting x 3 (left internal mammary  artery to left anterior descending, saphenous vein graft to diagonal,  saphenous vein graft to obtuse marginal).   SURGEON:  Kerin Perna, M.D.   ASSISTANT:  Loura Pardon, P.A.-C.   ANESTHESIA:  General.   ANESTHESIOLOGIST:  Belva Agee, M.D.   INDICATIONS:  The patient is a 77 year old hypertensive male with a history  of chronic lung disease on home oxygen therapy with previous coronary stents  placed at the LAD and diagonal and circumflex.  He was readmitted with  symptoms of unstable angina and cardiac catheterization demonstrated a 75-  80% left main stenosis with distal LAD disease.  He had anterior hypokinesia  with ejection fraction of 30%.  He was felt to be a candidate for surgical  coronary revascularization based on his symptoms and his bad coronary  anatomy including left main stenosis.  Prior to surgery, I reviewed the  results of the cardiac catheterization with the patient as well as his two  daughters.  I discussed the alternatives to surgical therapy as well as the  benefits and indications for surgical revascularization.  I reviewed with  the patient the major aspects of the proposed procedure including the  location of the surgical incisions, the choice of conduit for  grafting, the  use of general anesthesia, the cardiopulmonary bypass and the expected  postoperative hospital recovery.  I discussed with the patient the risks to  him of coronary bypass surgery for treatment of his severe coronary disease  including risks of MI, CVA, bleeding, blood transfusion requirement, wound  infection, and death.  He understood his long use of Plavix preoperatively  would increase his risks of blood transfusion requirements, platelet  transfusion and bleeding.  He understood the indications for the surgery and  agreed to proceed with the operation as planned under what I felt was an  informed consent.   OPERATIVE FINDINGS:  The patient's short stature, obesity and cardiomegaly  from hypertension made exposure of the coronary vessels extremely difficult.  The saphenous vein was harvested from the right thigh using endoscopic vein  harvest and was of average quality.  The mammary artery was small but with  good flow.  There was anterior scarring of the left ventricle.  The patient  has significant chronic lung disease.  The patient would not be a candidate  for redo surgical revascularization.   DESCRIPTION OF PROCEDURE:  The patient was brought to the operating room and  placed supine on the operating table where general anesthesia was induced  under invasive hemodynamic monitoring.  The chest, abdomen and legs were  prepped with Betadine and draped as a sterile field.  A sternal incision was  made, and the saphenous vein was harvested endoscopically from the right  thigh.  The internal mammary artery was harvested as a pedicle graft from  its origin at the subclavian vessels.  Heparin was administered, and the ACT  was documented as being therapeutic.  The sternal retractor was placed, and  the pericardium was opened.  Pursestrings were placed in the ascending aorta  and right atrium.  The patient was cannulated and placed on bypass, and  cooled to 32 degrees.   The coronaries were identified for grafting.  The  first diagonal was an adequate target.  The LAD was small distally but an  adequate target and the OM circumflex was small but an adequate target.  The  distal diagonal previously treated with a stent was too small to graft.   The patient was cooled to 30 degrees and cardioplegia catheters were placed  with an antegrade and retrograde delivery of cold blood cardioplegia into  the aortic root, and the coronary sinus.  As the aortic crossclamp was  applied, a total of 750 mL of cold blood cardioplegia was delivered to the  aortic root and coronary sinus with good cardioplegic arrest and septal  temperature dropping less than 14 degrees.  Topical iced saline slush was  used to augment myocardial preservation and a pericardial insulating pad was  used to protect the left phrenic nerve.   The distal coronary anastomoses were then performed.   The first distal anastomosis was to the posterior descending.  This was a  1.5 mm vessel with proximal 80% stenosis.  A reverse saphenous vein was sewn  end-to-side with a running 7-0 Prolene with good flow through the graft.  The second distal anastomosis was the obtuse marginal.  This was a smaller  1.4 mm vessel with proximal 80% stenosis.  A reverse saphenous vein was sewn  end-to-side with a running 7-0 Prolene with good flow through the graft.  The third distal anastomosis was to the distal third of the LAD.  This was a  1.5 mm vessel with proximal 90% stenosis.  The left internal mammary artery  pedicle was brought through an opening created in the left lateral  pericardium.  It was brought down onto the LAD and sewn end-to-side with a  running 8-0 Prolene.  There was good flow through the anastomosis with  immediate rise in septal temperature after release of the pedicle clamp on the mammary artery.  The mammary pedicle was secured to the epicardium and  aortic cross clamp was removed.   The  heart was cardioverted back to a regular rhythm.  Using a partial  occluding clamp placed carefully on the ascending aorta, two proximal vein  anastomoses were performed using a running 6-0 Prolene.  The partial clamp  was removed,and the vein grafts were perfused.  Each had excellent flow, and  hemostasis was documented in the proximal and distal anastomoses.  The  patient was rewarmed to 37 degrees and temporary pacing wires were applied.   When the patient rewarmed and the ventilator was  resumed, the patient was  weaned from bypass without difficulty.  Cardiac output and blood pressure  were stable and renal dose dopamine which was started for his preoperative  creatinine of 1.8.  Protamine was administered top reverse the heparin.  There was no adverse reaction to the Protamine.  The cannulas were removed.  The mediastinum was irrigated with warm antibiotic irrigation.  The leg  incision was irrigated and closed in a standard fashion.  The superior  mediastinal fat was closed over the aorta.  Two mediastinal and a left  pleural chest tube were placed and brought out through separate incisions.  The sternum was  reapproximated with interrupted steel wire.  The pectoralis fascia was  closed with interrupted #1 Vicryl.  The subcutaneous and skin were closed  with a running Vicryl.  Sterile dressings were applied.  Total bypass time  was 110 minutes with aortic cross lamp time of 55 minutes.                                               Mikey Bussing, M.D.    PV/MEDQ  D:  08/06/2002  T:  08/06/2002  Job:  [id number]   cc:   [facility] office   Aram Candela. Aleen Campi, M.D.  [address]  [place]  [place] [address]  Fax: [phone]

## 2010-07-28 NOTE — H&P (Signed)
[place]. [facility]  Patient:    [name], [name]                   MRN: [id number] Adm. Date:  [id number] Attending:  Silvestre Mesi Dictator:   Lillia Carmel Barefoot, P.A.                         History and Physical  DATE OF BIRTH: [birth year]  CHIEF COMPLAINT: Chest pain.  HISTORY OF PRESENT ILLNESS: This patient is a 77 year old 77 year old white male, a patient of Dr. Aleen Campi with a history of coronary artery disease, unstable angina, and with history of restenosis of the left anterior descending coronary artery, having undergone recent catheterization in November 2001, pacemaker placement secondary to bradycardia, congestive heart failure with left ventricular hypertrophy and ejection fraction of 32%, obesity, gout, acid reflux, ischemic cardiomyopathy, chronic alcohol use, and palpitations, admitted for chest pain, unstable angina, rule out myocardial infarction.  He describes onset of chest pain approximately two hours prior to presentation to our office.  This occurred while he was pumping gas at the gas station.  The pain is located in the lower left chest and he describes this as feeling similar to the pain he experienced with previous chest pain and subsequent admission.  The pain does not radiate to the left neck or left arm. He describes the pain as a pressure.  He has had no nausea or vomiting and no diaphoresis.  He does have chronic shortness of breath and is on continuous oxygen at home.  Although he states he has not had any change in his breathing his daughter states she has noted a dramatic change and worsened shortness of breath.  He is on Plavix but has an allergy to ASPIRIN.  He took one nitroglycerin prior to arrival at our office and states the pain was slightly diminished.  He has had two additional nitroglycerin in our office and while the pain is somewhat improved he still reports substantial left-sided chest pain.   On two liters of oxygen his oxygen saturation is 93%.  The pain has been worsened with exertion and improved somewhat with rest.  He denies any fever, chills, cough, purulent sputum, abdominal pain, nausea, vomiting, or peripheral edema.  His nitroglycerin is new and he just picked that up today. He has been admitted from the office for chest pain, unstable angina, rule out myocardial infarction.  Dr. Aleen Campi was in the office and was consulted and plans on following [name] in the hospital.  CURRENT MEDICATIONS:  1. Lasix 20 mg q.d.  2. Spironolactone 25 mg q.d.  3. Combivent inhaler q.i.d.  4. Allopurinol 300 mg q.d.  5. Lipitor 20 mg q.d.  6. Zestril 10 mg q.d.  7. Pepcid AC b.i.d.  8. Plavix 75 mg q.d.  ALLERGIES: ASPIRIN.  SOCIAL HISTORY: He is disabled and lives with his wife.  He has a daughter who is very supportive of him.  He is a nonsmoker but does report frequent alcohol use.  FAMILY HISTORY: His father had vascular disease and prostate cancer.  His sister had diabetes mellitus.  PAST MEDICAL HISTORY:  1. Congestive heart failure, with ejection fraction of 32%.  2. Left ventricular hypertrophy.  3. Pacemaker placement secondary to bradycardia.  4. Obesity.  5. Gout.  6. Benign prostatic hypertrophy.  7. Gastroesophageal reflux.  8. Ischemic cardiomyopathy.  9. Chronic alcohol use. 10. Hypertension. 11. Palpitations. 12.  Coronary artery disease, status post heart catheterization in November     2001 by Dr. Aleen Campi.  At that time the patient had two-vessel coronary     artery disease with severe stenosis in the distal LAD, which was opened.     There was an unsuccessful attempt to cross the distal LAD lesion by     approaching it through the LAD and diagonal stent. 13. Bronchial asthma, on chronic oxygen.  REVIEW OF SYSTEMS: The patient denies headache, earache, sore throat, cold symptoms, cough, chest congestion, fever, chills, abdominal pain,  nausea, vomiting, diarrhea, constipation, or peripheral edema.  He does have chronic shortness of breath and is on chronic oxygen, and has current chest pain.  He does have occasional heartburn but has had no symptoms recently.  PHYSICAL EXAMINATION:  VITAL SIGNS: Temperature 97.7 degrees, pulse 80 and paced, respirations 20, blood pressure 146/80.  Oxygen saturation on two liters oxygen 93%.  GENERAL: The patient is a pleasant, obese white male.  He is on oxygen via nasal cannula currently.  He appears somewhat fatigued, with reddish skin, and is resting on the examination table.  He is currently not dyspneic on oxygen.  HEENT: Head normocephalic, atraumatic.  TMs and oropharynx clear.  Mucosa appears moist without lesions.  NECK: Supple, without lymphadenopathy, bruits, or JVD.  LUNGS: Clear to auscultation bilaterally, with decreased breath sounds throughout, though there is no audible wheezing.  BREAST: Normal.  CARDIOVASCULAR: Normal sinus rhythm, pacemaker in place.  No murmurs, rubs, or gallops.  ABDOMEN: Obese, bowel sounds present throughout.  Nontender, nondistended.  No masses, rebound, or organomegaly.  GU: Examination deferred, noncontributory.  EXTREMITIES: Without clubbing, cyanosis, or edema.  Again, skin is warm and dry and there is no evidence of rash.  LABORATORY DATA: X-rays will be completed at the hospital.  EKG does reveal sinus rhythm with rate of 83, first degree AV block, and diffuse T wave abnormalities with possible ischemia.  ASSESSMENT:  1. Chest pain, probable unstable angina, rule out myocardial infarction.  2. Severe bronchial asthma, on chronic oxygen.  3. Coronary artery disease with previous percutaneous transluminal coronary     angioplasty and history of unstable angina.  4. Congestive heart failure with ejection fraction of 32%.  5. Pacemaker placement secondary to bradycardia.  6. Obesity.  7. Gout.  8. Benign prostatic  hypertrophy.  9. Gastroesophageal reflux disease. 10. Ischemic cardiomyopathy. 11. Chronic alcohol use. 12. Hypertension. 13. Palpitations.  14. ASPIRIN allergy, causing rash.  PLAN: The patient will be admitted to the service of Dr. Aleen Campi.  EMTs have been notified and are transporting the patient to [facility].  He will be admitted to rule out myocardial infarction.  We will place the patient on cardiac monitoring and nasal cannula oxygen at two liters per minute.  We will obtain routine vital signs and continue a 4 g no-fat modified diet, bed rest, and ask for the old chart to be brought to the Floor. We will check a CMP, CBC, CK-MB now and q.8h x 2, as well as a troponin I now and in eight hours.  We will also check a pro time and EKG now and daily x 2. The pharmacy has been asked to manage heparin.  Plavix will be continued but we will hold off on aspirin secondary to his allergy/rash.  We have asked that nitroglycerin drip be used at a rate to relieve pain.  Again, Dr. Aleen Campi will follow Mr. Nill in the hospital.  DD:  05/29/00 TD:  05/30/00 Job: [id number]

## 2010-07-28 NOTE — Op Note (Signed)
[name], [name]                ACCOUNT NO.:  [id number]   MEDICAL RECORD NO.:  [id number]          PATIENT TYPE:  INP   LOCATION:  4714                         FACILITY:  [facility]   PHYSICIAN:  [name]. [name] Ridgel, MD    DATE OF BIRTH:  May 01, 1932   DATE OF PROCEDURE:  07/03/2006  DATE OF DISCHARGE:                               OPERATIVE REPORT   PROCEDURE PERFORMED:  Removal of a previous implanted ICD, insertion of  a new BiV ICD, insertion of a new left ventricular pacing lead with  defibrillation threshold testing and ICD pocket revision.   INTRODUCTION:  The patient is a 77 year old man with recurrent episodes  of congestive heart failure secondary to severe LV dysfunction with a  QRS duration of 210 milliseconds.  He is underlying atrial fibrillation,  and is ventricular paced with his backup rate in the 30 beat per minute  range.  He is now referred for BiV ICD upgrade.   PROCEDURE:  After informed consent was obtained, the patient was taken  to the diagnostic EP lab in the fasting state.  After the usual  preparation and draping, intravenous fentanyl and midazolam was given  for sedation.  Lidocaine 30 mL was infiltrated over the left  infraclavicular region.  Prior to this, 10 mL of contrast was injected  into the left upper extremity venous system demonstrating the left  subclavian vein was patent.  A 7 cm incision was carried out over the  old ICD insertion site and electrocautery utilized to dissect down to  the fascial plane.  Care was taken not to enter the prior ICD pocket.  The left subclavian vein was subsequently punctured and a Medtronic  guiding catheter along with a 6-French Hexapolar EP catheter was  utilized to cannulate the coronary sinus.  It should be noted that  multiple attempts had been made previously utilizing the Guidant guiding  catheters which were unsuccessful secondary to very difficult coronary  sinus os position.  With the guiding catheter in the  coronary sinus,  venography of the coronary sinus was carried out.  This demonstrated an  acceptable posterolateral vein which was utilized for LV lead placement.  The St. Jude QuickFlex XL model 1158T passive fixation LV pacing lead,  serial number [id number], was advanced into the coronary sinus through the  guiding catheter and out onto the posterolateral wall of the left  ventricle.  In this location, approximately halfway from base to apex.  The diaphragm was not stimulated. The LV was 29 mV and the pacing  impedance 600 ohms.  Threshold was approximately 1 volt at  0.5  milliseconds.  With these satisfactory parameters, the lead was secured  to subpectoralis fascia with a figure-of-eight silk suture after the  guiding catheter was removed utilizing the slitter in the usual manner.  The lead was secured to the subpectoralis fascia with a figure-of-eight  silk suture and the sewing sleeve was also secured with silk suture.  Electrocautery was then utilized to enter the previous ICD pocket and  the old St. Jude ICD was removed without difficulty.  The  new St. Jude  Promote RF, model [id number] BiV ICD, serial number [id number], was connected  to the RV and the LV leads. The RA port was capped.  The pocket was  irrigated with kanamycin and the device was placed back in the pocket  after electrocautery was utilized to revise the pocket to accept the  larger ICD can.  At this point, additional kanamycin was utilized to  irrigate the pocket and defibrillation threshold testing carried out.   After the patient was more deeply sedated with fentanyl and Versed, VF  was induced with a T-wave shock and a 15 joule shock was delivered which  terminated VF and restored sinus rhythm.  No additional defibrillation  threshold testing was carried out and the incision closed with a layer  of 2-0 Vicryl, followed by a layer of 3-0 Vicryl, followed by a layer of  4-0 Vicryl.  Benzoin was pained on the skin.   Steri-Strips were applied  and pressure dressing was placed.  The patient was returned to his room  in satisfactory condition.   COMPLICATIONS:  There were no immediate procedure complications.   RESULTS:  This demonstrates successful upgrade to a BiV ICD in a patient  with symptomatic bradycardia, complete heart block, chronic atrial  fibrillation, congestive heart failure with QRS duration of 210  milliseconds.      [name]. [name] Ridgel, MD  Electronically Signed     GWT/MEDQ  D:  07/03/2006  T:  07/03/2006  Job:  [id number]   cc:   [name], MD  Jaclyn Prime. Lucas Mallow, M.D.

## 2010-07-28 NOTE — Discharge Summary (Signed)
[place]. [facility]  Patient:    [name], [name]                   MRN: [id number] Adm. Date:  [id number] Disc. Date: [id number] Attending:  Silvestre Mesi                           Discharge Summary  DISCHARGE DIAGNOSES: 1. Chest pain, myocardial infarction ruled out. 2. History of coronary artery disease. 3. History of permanent transvenous pacemaker. 4. History of congestive heart failure, compensated.  HISTORY OF PRESENT ILLNESS:  This 77 year old male was admitted on May 29, 2000, complaining of left chest pressure which was slightly improved with three nitroglycerin and was felt to be similar to this prior episodes of unstable angina.  His electrocardiogram showed 100% paced rhythm and his initial enzymes were normal.  HOSPITAL COURSE:  The patient was admitted for observation and evaluation of possible myocardial injury.  His telemetry remained stable with a paced rhythm and his serial enzymes were totally normal, ruling out any myocardial injury. After two days, he then described his pain as being a sharp, stabbing pain under his left breast and shooting toward his epigastrium.  He then related that it was not relieved with the nitroglycerin and had gradually improved over his first two days of hospitalization.  We felt that this was noncardiac at that time and with normal enzymes, he was discharged home in stable and improved condition without known etiology of his chest pain, but with cardiac injury ruled out.  DISCHARGE MEDICATIONS: 1. Lasix 20 mg q.d. 2. Spironolactone 25 mg q.d. 3. Combivent inhaler two puffs q.6h. p.r.n. 4. Allopurinol 300 mg q.d. 5. Lipitor 20 mg q.d. 6. Zestril 10 mg q.d. 7. Pepcid AC b.i.d. 8. Plavix 75 mg q.d.  ACTIVITY:  No restrictions.  DIET:  Low fat, low cholesterol diet.  FOLLOWUP:  Follow-up appointment in two to three weeks for office visit with Dr. Aleen Campi.  CONDITION ON DISCHARGE:  Stable  and improved. DD:  06/21/00 TD:  06/21/00 Job: [id number]

## 2010-07-28 NOTE — H&P (Signed)
[place]. [facility]  Patient:    [name], [name]                         MRN: [id number] Adm. Date:  10/24/99 Attending:  Aram Candela. Aleen Campi, M.D. Dictator:   Donzetta Matters, P.A.                         History and Physical  DATE OF BIRTH:  Dec 20, 1932.  CHIEF COMPLAINT:  Chest pain.  HISTORY OF PRESENT ILLNESS:  This is a 76 year old male that was recently in the hospital for dehydration, who left the hospital with O2 at 2 L nasal cannula.  He states he has had some stinging pain that was in his left chest, that radiated down to the left leg and then back up to his left arm.  He felt like this before he had his angioplasty in the past.  He still hurts some in his chest.  Ribs are also sore, though it does not hurt to move or take a deep breath or cough.  He does get some dizziness with standing, shortness of breath with any exertion.  States he cannot describe the severity.  Nothing makes the chest pains better or worse.  He also has stopped one of his fluid pills but has restarted it when he became fluid-overloaded.  FAMILY HISTORY:  A sister with diabetes.  A family member has vascular disease as well as prostate cancer.  SOCIAL HISTORY:  He is disabled.  The patient is with [employer].  PAST MEDICAL HISTORY:  Does have a history of LVH, ejection fraction of 32%, congestive heart failure, pacemaker due to bradycardia, obesity, gout, BPH, gastroesophageal reflux disease, chronic alcohol use, ischemic cardiomyopathy, hypertension, palpitations, coronary artery disease, and status post PTCA.  MEDICATIONS:  Albuterol two puffs p.r.n., Pepcid AC b.i.d., furosemide 20 mg daily, Plavix 75 mg daily, allopurinol 300 mg q.d., Lipitor 20 mg daily, spironolactone 25 mg daily, Combivent inhaler q.i.d., Flovent b.i.d., and Zestril 10 mg daily.  ALLERGIES:  Allergies by hospital record show ASPIRIN.  REVIEW OF SYSTEMS:  No fevers, chills.  He does have some  sweating.  States his weight is varied.  No edema is described at this time.  Does sleep well at night.  Has chest pain as noted in the HPI.  Does have some cough, has some wheezing.  Continues to be on oxygen.  Is a nonsmoker.  Denies any difficulty with nausea or vomiting.  No heartburn.  States he does have a long history of reflux controlled with the Pepcid AC.  Nocturia is four to five times per night.  Gait is steady.  Denies any myalgias.  Does have some dizziness but denies any fainting.  PHYSICAL EXAMINATION:  VITAL SIGNS:  Weight is 218 pounds.  Blood pressure is 117/78, pulse 72, respirations 18.  Height 5 feet 6 inches.  Temperature is afebrile.  GENERAL:  He is a well-developed, well-nourished male, obese.  Appears to be in good _____.  HEENT:  Pupils are equal, extraocular movements intact.  Mouth and pharynx were benign.  NECK:  Supple with no JVD, no bruits, no adenopathy.  CHEST:  With distant breath sounds, no wheezing noted.  HEART:  Paced, regular rate and rhythm.  ABDOMEN:  Soft, flat, active bowel sounds.  No palpable hepatosplenomegaly.  EXTREMITIES:  No edema.  NEUROLOGIC:  Oriented to person, place, time, and situation.  LABORATORY DATA:  Chest x-ray from hospitalization showed cardiomegaly with pulmonary vascular congestion, probably early interstitial edema.  This was dated on September 30, 1999.  Labs show CBC with white count of 7300, hemoglobin 13.9, hematocrit 39.9, platelet count 171.  PTT is 36.1, protime is 13.0 INR of 1.13.  Chemistries show sodium at 141, potassium 4.5, chloride 103, CO2 26, BUN 15, creatinine 0.9, glucose 111.  Normal liver function tests.  IMPRESSION: 1. Chest pain. 2. Pacemaker. 3. Coronary artery disease. 4. Left ventricular dysfunction.  PLAN:  Schedule heart catheterization on his date for evaluation. DD:  10/24/99 TD:  10/24/99 Job: [id number]

## 2010-07-28 NOTE — H&P (Signed)
[name], [name]                      ACCOUNT NO.:  [id number]   MEDICAL RECORD NO.:  [id number]                   PATIENT TYPE:  OBV   LOCATION:  1826                                 FACILITY:  [facility]   PHYSICIAN:  Vania Rea, M.D.              DATE OF BIRTH:  [birth year]   DATE OF ADMISSION:  09/10/2003  DATE OF DISCHARGE:                                HISTORY & PHYSICAL   PRIMARY CARE PHYSICIAN:  [name], M.D.   CHIEF COMPLAINT:  two episodes of syncope this evening.   HISTORY OF PRESENT ILLNESS:  This is a 77 year old Caucasian man with a  history of coronary artery, hypertension, COPD and diabetes who is status  post pacemaker about 25 years ago.  He was in his baseline  state of health,  was usually able to walk bout a block before becoming short of breath.  He  had episode of syncope after getting up and walking about 12 feet this  afternoon.  The patient states he had a prodromal syndrome of his vision  turning blue,I because dizzy and then he fell.  Relatives, because of the  panic, are not sure about how long he was unconscious; they say maybe one or  two minutes.  The patient got up and seemed okay, and was drove home, but  when he got out the car had a second episode of syncope; and, was eventually  brought to the emergency room.   They attempted to treat him with meclizine, but that did not seem to help.   The patient was recently discharged after a overnight admission on June 09th  at this hospital where he was seen for vertigo and chest pain; and,  myocardial infarction was ruled out.  At the time of discharge his weight  was 204 pounds.  He has been taking Lasix consistently and he has been  active in the Summer heat; and, his weight today, three weeks later, is 204  pounds.  His daughter states he drinks a lot of liquids.  He states he  passes a lot of urine both day and night.   The patient denies fever, cough or cold.  He denies nausea,  vomiting and  diarrhea.  He denies hematuria or any blood loss.   PAST MEDICAL HISTORY:  1. Chronic episodic dizziness.  2. Normocytic anemia.  3. Cardiomyopathy with an ejection fraction of 20-30% in January 2002.  4. CAD status post MI.  5. _______ CABG in May 2004.  6. Hypertension  7. COPD on home O2.  8. Gout.  9. Obesity.  10.      GERD.  11.      BPH.  12.      Continued intermittent alcohol abuse.  13.      Diabetes.  14.      Status post pacemaker placement many years ago.   MEDICATIONS:  1. Lasix 40 mg daily.  2.  Potassium 20 mEq daily.  3. Lipitor 20 mg daily.  4. Digoxin 0.125 mg daily.  5. Toprol XL 25 mg daily.  6. Pepcid 20 mg daily.  7. Glipizide 5 mg daily,  8. Allopurinol 300 mg daily.  9. Lisinopril 20 mg daily,  10.      Cardura 2 mg daily.  11.      Meclizine 12.5 mg three times daily when necessary.   ALLERGIES:  ASPIRIN causes hives.   SOCIAL HISTORY:  Lives in [place] with his daughter, [name].  His wife died  a year ago after 36 years of marriage.  His daughter ________ is the health  care Power of Attorney and she is present with him at this hospital visit.  He drinks a fifth of whiskey about every three months according to himself  and his daughter; and, his alcohol use has cut down significantly.  He chews  tobacco throughout the day, but does not smoke.  He has no illicit drug use.  He is a retired [employer].   FAMILY HISTORY:  Family history is significant for myocardial infarction.  A  strong family history of diabetes, but all three daughters are healthy.   REVIEW OF SYSTEMS:  Denies headache.  Does seem to have decreased hearing in  the right ear when is head is turned to the left, but apparently his ears  have been investigated and no abnormalities have been found.  He has a  chronic rash around his head and neck, but it does not itch.  Has chronic  shortness of breath, but is usually able to walk about one block before  he  as to rest.  He is on chronic home O2 since the syncopal episodes today.  He  has apparently been dyspneic at rest.  He is having no chest pains or  palpitations.  Denies any symptoms related to the GI tract or the  genitourinary tract.  He has polyuria and nocturia, which he attributes to  Lasix,but he also drinks a lot.  He has lost 20 pounds in the past three  months and 4 pounds in the past three weeks.  He has a history of gout, but  is not currently having any joint pains or swelling.  No history of strokes  or focal weakness.   PHYSICAL EXAMINATION:  GENERAL APPEARANCE:  On examination this is an  elderly Caucasian man lying in bed.  He looks fairly healthy in no distress.  Somewhat obese.  VITAL SIGNS:  Temperature is 98.1, blood pressure is 88/46, pulse is 78,  respirations 18, and he is saturating at 96% on 2 liters.  HEENT:  Pupil are round, equal and react to light.  Mucous membranes are  dry,  CHEST:  Chest is clear to auscultation bilaterally.  CARDIOVASCULAR SYSTEM:  Patient has a regular rhythm.  ABDOMEN:  Abdomen is obese, soft and nontender.  EXTREMITIES:  Patient has trace edema bilaterally.  CENTRAL NERVOUS SYSTEM:  Cranial nerves are grossly intact and he has no  focal neurologic deficit.  He does not appear depressed or anxious.   LABORATORY DATA:  Hematocrit is 34.  His sodium is 139, potassium 3.7,  chloride 105, BUN 19, and creatinine 2.7.  this is elevated from his  creatinine of 1.6 three week ago.  Platelets are 173,000 and he has a normal  differential on his white count.  His first set of cardiac enzymes were  negative.  Occult blood is negative.  Digoxin  level is 0.7.  His liver  function tests are normal and his serum glucose is 138.   ASSESSMENT:  1. Acute renal failure.  2. Syncope most likely caused by dehydration related to Lasix use and     exposure to the Summer heat.  3. Diabetes mellitus, fair control. 4. History of hypertension and now  hypotensive.  5. Coronary artery disease.  6. History of chronic obstructive pulmonary disease  and chronic dyspnea     with chronic oxygen use.   PLAN:  The history and biochemical findings are fairly typical of an elderly  person on diuretics in the Summertime.  He can probably discontinue  diuretics on discharge until the weather cools down as long as he is  checking is weight regular   For now:  1. We will admit him.  2. We will hydrate him cautiously.  3. We will monitor his creatinine.  4. We will do serial enzymes to be sure he has not had a myocardial     infarction.  5. We will hold his antihypertensives, particularly his lisinopril.  6. We will hold his Lasix and we will also hold his Cardura.  Cardura itself     is liable to cause orthostasis, but if it is necessary for his BPH his     primary care physician my consider whether he wants to continue it or     not.  7. His digoxin level is normal and we will continue his digoxin.  8. With hydration his potassium will probably drop and we will continue him     on his potassium.   We expect this to be an observation admit and he will be reassessed in the  morning prior to discharge.                                                Vania Rea, M.D.    LC/MEDQ  D:  09/11/2003  T:  09/11/2003  Job:  [id number]

## 2010-07-28 NOTE — Discharge Summary (Signed)
[name], HANDLEY                ACCOUNT NO.:  [id number]   MEDICAL RECORD NO.:  [id number]          PATIENT TYPE:  INP   LOCATION:  3710                         FACILITY:  [facility]   PHYSICIAN:  [name], MD    DATE OF BIRTH:  1932-12-22   DATE OF ADMISSION:  05/14/2004  DATE OF DISCHARGE:  05/17/2004                                 DISCHARGE SUMMARY   FINAL DIAGNOSES:  1.  Congestive heart failure, acute on chronic.  2.  History of coronary artery disease.  3.  History of hypertension.  4.  History of diabetes mellitus.   REASON FOR ADMISSION:  This 77 year old male was admitted after increasing  symptoms of congestive failure with increased shortness of breath and  paroxysmal nocturnal dyspnea.  His activity level was markedly decreased and  he was brought to the emergency room for further evaluation and treatment.  In the ER he was noted to be dyspneic at rest and his BNP was elevated to  634.  He was then admitted for stabilization and evaluation.   HOSPITAL COURSE:  The patient responded readily to intravenous diuretic with  a very good urine output and negative fluid balance.  His symptoms improved  gradually and he was able to sleep overnight by the third hospital day  without difficulty and without PND.  His cardiac enzymes were normal and his  BNP decreased significantly on the second hospital day to 471.  He was  started on an ARB which decreased his blood pressure mildly, however he  remained asymptomatic and his symptoms improved dramatically.  By the third  hospital day he was walking in the hall without difficulty, without  shortness of breath, and had slept well overnight.  His fluid balance had  stabilized at that time.  He has now reached maximum hospital benefits and  will be discharged to home in stable improved condition.   DISPOSITION ON DISCHARGE:   MEDICATIONS:  1.  Lipitor 20 mg daily.  2.  Toprol 25 mg tablet, 1/2 tablet b.i.d.  3.  Lanoxin  0.125 mg daily.  4.  IMDUR 30 mg daily.  5.  Glipizide 5 mg daily.  6.  Coumadin as directed.  7.  Allopurinol 300 mg daily.  8.  We will change his furosemide to 40 mg b.i.d.  9.  New will be K-Dur 10 mEq daily.  10. Avapro 150 mg daily.  11. Pain medication not applicable.   ACTIVITY:  No restrictions.   DIET:  No added salt diet.  Diabetic diet.   WOUND CARE:  Not applicable.   FOLLOWUP INSTRUCTIONS:  He has an office visit scheduled for Friday at our  office.  We will arrange further followup following Fridays visit.   CONDITION ON DISCHARGE:  Improved and stable.      JRT/MEDQ  D:  05/17/2004  T:  05/17/2004  Job:  [id number]

## 2010-07-28 NOTE — Discharge Summary (Signed)
NAMECORLEY, MAFFEO NO.:  [id number]   MEDICAL RECORD NO.:  [id number]          PATIENT TYPE:  INP   LOCATION:  3701                         FACILITY:  [facility]   PHYSICIAN:  Elliot Cousin, M.D.    DATE OF BIRTH:  1932-06-02   DATE OF ADMISSION:  05/19/2005  DATE OF DISCHARGE:  05/23/2005                                 DISCHARGE SUMMARY   DISCHARGE DIAGNOSES:  1.  Dyspnea secondary to acute congestive heart failure and chronic      obstructive pulmonary disease.  2.  Ischemic cardiomyopathy/history of coronary artery disease and status      post coronary artery bypass graft in the past; status post implantable      cardioverter defibrillator and VVI pacer in the past. Echocardiogram      during this hospitalization with stable ejection fraction of 20%.  3.  Oxygen dependent chronic obstructive pulmonary disease with radiographic      evidence of chronic interstitial lung disease.  4.  Chronic Coumadin therapy.  5.  Type 2 diabetes mellitus.  6.  Hypertension.     For secondary discharge diagnoses and past medical history, please see the  dictated history and physical.   DISCHARGE MEDICATIONS:  1.  Coumadin changed to 2.5 milligrams 1 pill alternating with 2 pills daily      (2.5 milligrams 1 a day followed by 5 milligrams the next day,      alternating each day).  2.  Lasix 40 milligrams b.i.d. x5 more days and then back to 40 milligrams 1      pill daily.  3.  Potassium chloride 20 mEq daily.  4.  Lipitor 20 milligrams q.h.s.  5.  Toprol XL 25 milligrams daily.  6.  Lisinopril 10 milligrams daily.  7.  Digoxin 0.125 milligrams daily.  8.  Pepcid 20 milligrams daily.  9.  Glipizide 5 milligrams daily.  10. Allopurinol 300 milligrams daily.  11. Albuterol MDI 2 puffs every 4-6 hours as needed.  12. Oxygen therapy 2 liters per minute.   DISCHARGE DISPOSITION:  The patient was discharged to home in improved and  stable condition on March14,2007. The  patient has a hospital follow-up  appointment with the pharmacist at [facility] on  [phone] and with Dr. Marica Otter. [place] on [place].   PROCEDURE PERFORMED:  1.  2-D echocardiogram performed on March13,2007: The results revealed that      the left ventricle was mildly to moderately dilated, overall left      ventricular systolic function was severely reduced with an ejection      fraction estimated to be 20%. Diffuse left ventricular hypokinesis with      regional variations. Aortic valve thickness was mildly to moderately      increased. Findings consistent with mild aortic valve stenosis with a      mean transaortic valve gradient of 9 mmHg. Left atrium was moderately      dilated. Mild mitral valve regurgitation.  2.  CT scan of the chest with contrast. The results revealed no acute  abnormality. Stable mediastinal adenopathy and stable chronic      interstitial lung disease. No evidence of pulmonary embolism.   HISTORY OF PRESENT ILLNESS:  The patient is a 77 year old man with a past  medical history significant for severe ischemic cardiomyopathy, diabetes  mellitus, and oxygen dependent COPD, who presented to the emergency  department on March10,2007 with a chief complaint of shortness of breath and  a recent weight gain. When the patient was evaluated in the emergency  department, his chest x-ray revealed chronic lung changes but no evidence of  acute findings. His BNP was elevated at 582. The patient was admitted for  further evaluation and management.   HOSPITAL COURSE:  Problem 1: CONGESTIVE HEART FAILURE: The patient was  started on intravenous Lasix therapy 60 mg IV q.12h. The patient's usual  home dose is 40 milligrams p.o. daily. Strict I's and O's as well as daily  weights were ordered. Cardiac enzymes were ordered and revealed negative  results. Symptomatic treatment was started with albuterol and Atrovent  nebulizers. Over the course  of the hospitalization, the patient improved  clinically. However, he did have one episode of acute dyspnea which prompted  further evaluation with a CT scan of the chest to rule out PE. The CT scan  of the chest was negative for PE; however, it did reveal chronic  interstitial lung changes. Following diuresis and symptomatic treatment with  nebulizers, the patient's dyspnea resolved. At the time of hospital  discharge, he was negative a total of 3 liters per the results of the I's  and O's. His weight at the time of hospital discharge was 182. The patient  was advised to increase his usual dose of Lasix to 40 milligrams b.i.d. for  5 additional days and then decrease it back to 40 milligrams daily or per  the recommendations and discretion of his primary cardiologist, Dr.  Aleen Campi. Of note, at the time of hospital discharge, the patients BNP  improved to 113.   Problem 2: ISCHEMIC CARDIOMYOPATHY/STATUS POST CABG IN THE PAST/STATUS POST  ICD PLACEMENT IN THE PAST/STATUS POST PACEMAKER PLACEMENT IN THE PAST: The  patient was treated medically as above. A 2-D echocardiogram was ordered and  revealed a stable ejection fraction of 20% as read by Dr. Lucas Mallow. The patient  was continued on Coumadin therapy during hospital course. His INR was  monitored daily. The pharmacist adjusted the dosing of Coumadin accordingly.  At the time of hospital admission, his INR was 2.2. However, throughout the  hospital course, the INR did decrease. At the time of hospital discharge,  his INR was 1.6. The patient was given an increased dose of Coumadin at 7.5  milligrams on the night prior to hospital discharge. He was advised to  increase his Coumadin therapy to 2.5 milligrams daily alternating with 5  milligrams daily. The patient will follow up with the pharmacist at  [facility] in 5 days as scheduled. The patient was also maintained on digoxin therapy, ACE inhibitor therapy, and beta  blocker  therapy during the hospital course. At the time of hospital admission, the  patient's digoxin level was 0.3. No adjustments were made. Further  management per Dr. Aleen Campi.   Problem 3: OXYGEN-DEPENDENT COPD/CHRONIC INTERSTITIAL LUNG DISEASE: The  patient was maintained on oxygen therapy. Albuterol and Atrovent nebulizers  were administered as well. At the time of hospital discharge, the patient's  lungs were clear on exam.  He was advised to continue his chronic inhaler therapy  and to use albuterol  MDI 2 puffs every 4 hours as needed.   Problem 4: TYPE 2 DIABETES MELLITUS: The patient's capillary blood sugars  were well controlled during hospital course on glipizide.   Problem 5: HYPERTENSION: The patient's blood pressures were well controlled  during the hospital course on Toprol XL and Lisinopril.   DISCHARGE LABORATORY DATA:  BNP 113, sodium 137, potassium 4.5, chloride 97,  CO2 30, glucose 115, BUN 14, creatinine 1.2, calcium 9.7. WBC 6.9,  hemoglobin 13.2, hematocrit 38.5, platelets 160,000.   At the time of the patient's follow-up appointment with the pharmacist  and/or Dr. Aleen Campi, a PT/INR and BMET should be reassessed.      Elliot Cousin, M.D.  Electronically Signed     DF/MEDQ  D:  05/23/2005  T:  05/24/2005  Job:  [id number]   cc:   [name], MD  Fax: [phone]

## 2010-07-28 NOTE — Discharge Summary (Signed)
[name], [name]                      ACCOUNT NO.:  [id number]   MEDICAL RECORD NO.:  [id number]                   PATIENT TYPE:  INP   LOCATION:  2031                                 FACILITY:  [facility]   PHYSICIAN:  Elliot Cousin, M.D.                 DATE OF BIRTH:  11/08/1932   DATE OF ADMISSION:  08/18/2003  DATE OF DISCHARGE:  08/19/2003                                 DISCHARGE SUMMARY   DISCHARGE DIAGNOSES:  1. Chest pain, myocardial infarction ruled out.  2. Vertigo.  3. Hypokalemia.  4. Normocytic anemia.   SECONDARY DISCHARGE DIAGNOSES:  1. Admission, in January 2005, for chest pain, myocardial infarction ruled     out by negative enzymes.  2. Cardiomyopathy.  A 2D echocardiogram, in January 2004, revealed an     ejection fraction of 20-30%, diffuse left ventricular hypokinesis.  The     left ventricle was mildly to moderately dilated.  Dyskinesis of the mid     distal periapical wall.  Mild to moderate valvular regurgitation.  The     left atrium was moderately dilated.  3. Coronary artery disease, status post myocardial infarction.     a. Status post three-vessel coronary artery bypass graft surgery, on Aug 04, 2002.  LIMA to LAD, saphenous vein graft to diagonal I, saphenous        vein graft to OM-1.  4. Status post excision and drainage of right saphenous vein graft harvest     site in June 2004.  MRSA positive culture, treated.  5. Hypertension.  6. Chronic obstructive pulmonary disease on 2 liters of nasal cannula oxygen     chronically.  7. Gout.  8. Obesity.  9. Gastroesophageal reflux disease.  10.      Benign prostatic hypertrophy.  11.      Continued alcohol abuse.  12.      History of glucose intolerance.  13.      Status post pacemaker approximately 12 years ago.   DISCHARGE MEDICATIONS:  1. Potassium chloride 20 mEq b.i.d. x 1 week, then back to every day.  2. Antivert 12.5 mg t.i.d. for three days and then t.i.d. p.r.n. for  dizziness.  3. Centrum Silver multivitamin with iron one daily.  4. Lipitor 20 mg q.h.s.  5. Lisinopril 10 mg every day.  6. Digitek 0.125 mg daily.  7. Allopurinol 300 mg daily.  8. Potassium chloride 20 mEq daily.  9. Pepcid 20 mg daily.  10.      Furosemide 40 mg daily.  11.      Cardura 2 mg q.h.s.  12.      Toprol XL 25 mg daily.   DISCHARGE DISPOSITION:  1. The patient was discharged to home in improved and stable condition on     August 19, 2003.  2. He was advised to follow up with Dr. Aleen Campi in one week  for hospital     followup.   CONSULTATIONS:  John R. Aleen Campi, M.D.   PROCEDURES PERFORMED:  Carotid Dopplers on August 19, 2003.  The results  revealed no ICA stenosis.  Vertebral artery flow antegrade.   HISTORY OF PRESENT ILLNESS:  The patient is a 77 year old man with a past  medical history significant for coronary artery disease, status post three-  vessel CABG in May 2004, status post pacemaker approximately 12 years ago,  and cardiomyopathy with an ejection fraction of 20-30%, who presented to the  emergency department with an episode of chest pain and dizziness on the  morning of August 18, 2003.  The patient stated that he rolled over in bed and  became dizzy.  He subsequently developed chest pain in the central chest  area.  After a few minutes, the chest pain and the dizziness subsided.  The  dizziness and chest pain recurred, after the patient got up to use the  bathroom.  It again subsided; however, the patient's decided to call his  daughter, who subsequently brought him to the emergency department.  The  patient had no associated headache, visual changes, blindness, double  vision, numbness, dysphagia, facial droop, or difficulty speaking.  He has  chronic shortness of breath.  He had no radiation of the pain.  He did have  some mild diaphoresis but no pleurisy, no nausea, and no syncope.   HOSPITAL COURSE:  1. CHEST PAIN.  The initial evaluation started in  the emergency department.     The patient's initial EKG revealed a paced ventricular rhythm with a rate     of 81.  The initial emergency department cardiac markers were negative x     3.  The patient's chest x-ray revealed cardiomegaly and vascular     congestion.  The pacemaker lead was in a satisfactory position.  The     patient's initial blood work was unremarkable with the exception of the     hemoglobin being low at 11.6 and the hematocrit being low at 33.8.  The     MCV was 94.  The patient was given a sublingual nitroglycerin in the     emergency department which helped his pain just only slightly.  He was,     therefore, started on a nitroglycerin drip and Lovenox 1 mg subcu q.12h.     The patient is allergic to aspirin and was, therefore, anticoagulated     with Lovenox until cardiac enzymes ruled out for an acute myocardial     infarction.  Cardiac enzymes every eight hours were ordered.  They were     completely within normal limits x 3 sets during the 24 hour hospital     stay.  The patient was treated with morphine as well.  He actually did     not need morphine for pain.  His digoxin level was assessed and found to     be actually low at 0.3.  His fasting lipid panel was also ordered, and     the patient was found to have a total cholesterol of 124, triglycerides     of 231, HDL cholesterol of 20, LDL cholesterol of 58.  He was maintained     on Lipitor 20 mg q.h.s. Dr. Aleen Campi was consulted for further evaluation     and management.  Dr. Aleen Campi saw the patient the following day.  Per Dr.     Adelene Idler assessment, the patient had noncardiac chest pain.  It  was     most likely secondary to chest wall pain.  On Dr. Adelene Idler exam, the     patient appeared to be somewhat tender over the left chest wall.  The     patient, as stated above, was treated with as needed morphine and Tylenol    for pain.  He was continued on his antihypertensive medications, Toprol     and  Lisinopril.  He was maintained on Lanoxin and Lasix as well.  The     nitroglycerin drip and the Lovenox were discontinued by Dr. Aleen Campi,     after the patient ruled out for a myocardial infarction.  The patient was     advised to use sublingual nitroglycerin as needed if chest pain were to     occur.  He was also advised to use Tylenol as needed as well.  Further     adjustments in the patient's Lipitor dosing will be deferred to the     patient's cardiologist and primary care physician, Dr. Aleen Campi.  The     patient was chest pain free and in no acute distress at the time of     hospital discharge.  2. VERTIGO.  The patient's symptoms actually started with vertigo at home as     the patient turned over in bed on the morning that he was eventually     admitted to the hospital.  His symptoms were consistent with benign     positional vertigo.  When he arrived to the hospital, he had no     complaints of dizziness on exam.  Evaluating the patient with a MRI of     the brain was entertained; however, the patient has a pacemaker;     therefore, the MRI was contraindicated.  The patient was evaluated with a     carotid Doppler study.  The carotid Doppler study was negative for     evidence of ICA stenosis bilaterally.  The patient had no focal     neurological deficits on exam.  He was treated with Antivert 12.5 mg     t.i.d.  At the time of hospital discharge, the patient had no complaints     of vertigo.  3. HYPOKALEMIA.  The patient's potassium, on admission, was 3.8.  The     following day, the potassium fell to 3.3.  The patient is on chronic     Lasix therapy; therefore, it was felt that the hypokalemia was secondary     to Lasix.  He is chronically treated with potassium chloride in the     outpatient setting at 20 mEq daily.  However, the patient will be     discharged to home on potassium chloride 20 mEq b.i.d. x 1 week and then     back to 20 mEq one tablet every day.  A followup  assessment, per Dr.     Aleen Campi.  4. NORMOCYTIC ANEMIA.  The patient's hemoglobin, on admission, was 11.6 with     a hematocrit of 33.8 and MCV of 94.  For further evaluation, iron studies     and a TSH were ordered.  The ferritin was 650, total iron was low at 41,     TIBC within normal limits at 266, percent saturation low at 15, vitamin B-     12 305, folate within normal limits at greater than 20.  The patient was     started on a multivitamin with iron one daily.  If  the patient's has not     had a colonoscopy, it is certainly recommended electively.  This decision     will be deferred to the patient's primary care physician, Dr. Aleen Campi.                                                Elliot Cousin, M.D.    DF/MEDQ  D:  08/19/2003  T:  08/21/2003  Job:  [id number]   cc:   Aram Candela. Aleen Campi, M.D.  [address]  [place]  [place] [address]  Fax: [phone]

## 2010-07-28 NOTE — H&P (Signed)
[name], WINGARD NO.:  [id number]   MEDICAL RECORD NO.:  [id number]          PATIENT TYPE:  INP   LOCATION:  1829                         FACILITY:  [facility]   PHYSICIAN:  Lonia Blood, M.D.DATE OF BIRTH:  23-Sep-1932   DATE OF ADMISSION:  05/19/2005  DATE OF DISCHARGE:                                HISTORY & PHYSICAL   PRIMARY CARE PHYSICIAN:  Dr. Aleen Campi.   CHIEF COMPLAINT:  Shortness of breath x3 days.   HISTORY OF PRESENT ILLNESS:  [name] is a 77 year old gentleman with  a known history of severe ischemic cardiomyopathy and an EF measured at 60-  30% in January of 2004 via an echocardiogram. He is prone to intermittent  acute exacerbation of his known CHF. He reports that his baseline weight is  175 pounds and that he weighed in at 188 pounds at the time of his  presentation to the emergency room. He states that approximately three days  ago he began to develop shortness of breath at lot worse than usual. He  reports that he is short of breath on exertion at his baseline. He wears  oxygen at two liters per nasal cannula at all times for unclear reasons but  possibly simply secondary to his CHF. He states that he tinkering with his  Lasix at home. He does report that he is taking at least 40 milligrams a  day, however, every day and sometimes more. His shortness of breath did not  improve. He normally sleeps at an approximate 40 degree angle in a hospital  bed at home because of his decompensated heart failure. He reports that he  has had to sit at almost 90 degree angle for the last two nights because of  severe shortness of breath. He has not noted lower extremity edema but  states that he does not always get this when he is having trouble with his  heart failure. He denies chest pain. He does report generalized body aches.  There has been no nausea or vomiting. There has been no diarrhea. There has  been no wheezing but the patient does  hear some rattling around in there.   REVIEW OF SYSTEMS:  Comprehensive review of systems is unremarkable with the  exception of positive elements noted in the history of present illness noted  above.   PAST MEDICAL HISTORY:  1.  Congestive heart failure/ischemic cardiomyopathy with EF 20-30% in      January 2004 on echocardiogram.  2.  Coronary artery disease, status post coronary artery bypass graft-year      unknown.  3.  History of syncope with implantable cardiac defibrillator and VVI pacer      placed multiple years ago per Dr. Duke Salvia.  4.  Hypertension.  5.  Diabetes mellitus.  6.  COPD on home O2 at two liters per minute nasal cannula-no recent tobacco      abuse.  7.  Longstanding history of gout.  8.  Gastroesophageal reflux disease.  9.  Benign prostatic hypertrophy.  10. Prior history of alcohol abuse with  recent occasional alcohol use..   OUTPATIENT MEDICATIONS:  1.  Allopurinol 300 milligrams p.o. daily.  2.  Lipitor 20 milligrams daily.  3.  Toprol XL 25 milligrams daily.  4.  Glipizide 5 milligrams daily.  5.  Lasix 40 milligrams daily.  6.  Lisinopril 10 milligrams daily.  7.  Digoxin 0.125 milligrams p.o. daily.  8.  Pepcid 20 milligrams p.o. daily.  9.  Coumadin dose unclear, daily.   ALLERGIES:  ASPIRIN leads to hives.   FAMILY HISTORY:  Noncontributory secondary to age.   SOCIAL HISTORY:  The patient lives alone. He has family in town to include a  daughter who helps look out for him. He denies current tobacco abuse.   LABORATORY DATA:  Hemoglobin is mildly decreased at 12.4 with a MCV of 93.  White count is normal at 7.5. Platelet count is 157,000. MCV of 93.  Potassium is low at 3.2. Sodium, chloride, bicarbonate, BUN and creatinine  are all normal. Serum glucose is 91. INR is 2.2. A 12-lead EKG reveals  evidence of pacer spikes. Point of care markers are negative x1. D-dimer is  elevated at 0.77. Chest x-ray reveals chronic lung changes  but no evidence  of acute findings. BNP is elevated at 582.   PHYSICAL EXAMINATION:  VITAL SIGNS: Temperature 97.0, blood pressure 133/84,  heart rate 67, respiratory rate 20. O2 saturation 98% on two liters per  minute nasal cannula.  GENERAL: Obese male in mild respiratory distress using accessory muscles who  is alert.  HEENT: Normocephalic and atraumatic. Pupils are equal, round, and reactive  to light and accommodation. Extraocular movements intact bilaterally. OC/OP  clear.  NECK: JVD approximately half way up the neck at 30 degrees. No  lymphadenopathy.  LUNGS:  Bibasilar crackles throughout. No active wheeze. Good air movement  throughout all other fields bilaterally.  CARDIOVASCULAR: Distant regular rate and rhythm. No appreciable gallop or  rub.  ABDOMEN: Obese, soft. Bowel sounds present. No hepatosplenomegaly, no  rebound and no ascites.  EXTREMITIES: Trace bilateral lower extremity edema to the knees without  cyanosis or clubbing.  NEUROLOGICAL: Alert and oriented. Cranial nerves II through XII intact.  Strength 5/5 bilateral upper and lower extremities. No Babinski.   IMPRESSION/PLAN:  1.  Acute on chronic congestive heart failure secondary to ischemic      cardiomyopathy: The patient will be admitted to the acute unit. Digoxin      level will be obtained. Digoxin dosing will be adjusted as appropriate.      I will increase the patient's Lasix dose to 60 milligrams and give it at      the increased frequency of q.12h. via the IV route. We will follow ins      and outs very closely. We will hold any beta blocker due to the      patient's acute decompensation. We will continue his ACE inhibitor and      his digoxin and we will add aldactone to attempt to bring about a more      rapid diuresis. We will restrict his diet to 2 gm sodium total and 1500      cc of fluid restriction. 2.  Known coronary artery disease: The patient is not having symptoms      consistent with  acute coronary syndrome at the time. Pacemaker would      make evidence of acute ischemia on EKG extremely hard to appreciate. I      will empirically rule the patient  out with serial cardiac enzymes. We      will recheck a 12-lead in the a.m.  3.  Diabetes mellitus: We will follow the complete blood glucoses on the      q.a.c. and q.h.s. basis. We will continue glipizide. We will provide      extra coverage using sliding scale insulin and adjust baseline therapy      as appropriate.  4.  Hypertension: The patient's blood pressure is reasonably well controlled      at the present time. It is my hope that additional aldactone will      compensate for discontinuation of beta blocker for now. We will follow      his blood pressure very closely and further titrate medications as      necessary. We will considering adding hydralazine if the patient's blood      pressure becomes difficult to control.  5.  Gout: The patient's gout appears to be well controlled. We will continue      allopurinol for now.  6.  Hypokalemia: The patient does have a hypokalemia. This is felt to be      secondary to Lasix and probable increased Lasix recently as reported by      the patient. We will check a magnesium level. We will supplement      potassium at 40 mEq p.o. q.8h.      Lonia Blood, M.D.  Electronically Signed     JTM/MEDQ  D:  05/19/2005  T:  05/19/2005  Job:  [id number]   cc:   [name], MD  Fax: [phone]

## 2010-07-28 NOTE — H&P (Signed)
[name], [name]                      ACCOUNT NO.:  [id number]   MEDICAL RECORD NO.:  [id number]                   PATIENT TYPE:  INP   LOCATION:  1827                                 FACILITY:  [facility]   PHYSICIAN:  Elliot Cousin, M.D.                 DATE OF BIRTH:  Sep 27, 1932   DATE OF ADMISSION:  08/18/2003  DATE OF DISCHARGE:                                HISTORY & PHYSICAL   PRIMARY CARE PHYSICIAN:  John R. Tysinger, M.D.   CHIEF COMPLAINT:  Chest pain and dizziness.   HISTORY OF PRESENT ILLNESS:  The patient is a 77 year old man with a past  medical history significant for coronary artery disease, status post three-  vessel coronary artery bypass graft surgery in May 2004, status post a  pacemaker approximately 12 years ago, cardiomyopathy with an ejection  fraction estimated at 20%-30%, who presented to the emergency department  with an episode of chest pain and dizziness this morning.  The patient  states that when he rolled over in bed this morning, he became dizzy.  He  also subsequently developed some chest pain in the central chest that  radiated to the right and to the left.  The dizziness was described as the  room spinning around.  It stopped after a minute or two.  He then proceeded  to get up to use the bathroom.  The dizziness restarted, along with the  chest pain.  This occurred yet another time.  The dizziness and the chest  pain subsided after approximately five to six minutes.  The patient did have  some diaphoresis, but no associated radiation of the pain.  No associated  pleurisy.  No associated nausea.  No associated syncope.  When the patient arrived to the emergency department, he was given  sublingual nitroglycerin which took his pain from a 6/10 to a 4/10.  He was  subsequently placed on a nitroglycerin drip.  His pain was now resolved.  The patient had no prior history of vertigo.  He denied any associated  headache, visual changes, blindness,  double vision, numbness, dysphagia,  facial droop, or difficulty speaking.  He does have chronic shortness of  breath, but it is no worse than usual.  When the patient arrived to the emergency department his temperature was  97.8 degrees, blood pressure 140/79, pulse 81, respirations 26, oxygen  saturation 98% on 2 L nasal cannula oxygen.  His electrocardiogram revealed  a paced rhythm with left axis deviation and a right bundle branch block.  The initial cardiac markers were negative.  Given the patient's chronic  medical history, he will be admitted for further evaluation and management.   PAST MEDICAL HISTORY:  1. Admission in January  2005, for chest pain.  A myocardial infarction     ruled out by negative enzymes.  2. Cardiomyopathy.  A 2-D echocardiogram in January  2004, revealed an     ejection  fraction of 20%-30%.  Diffuse left ventricular hypokinesis.  The     left ventricle was mildly to moderately dilated.  Dyskinesis of the mid-     distal periapical wall.  Mild to moderate valvular regurgitation.  Left     atrium was moderately dilated.  3. Coronary artery disease, status post myocardial infarction     a. Status post three-vessel coronary artery bypass graft surgery on Aug 04, 2002, a LIMA to LAD, saphenous vein graft to the diagonal-I,        saphenous vein graft to OM-1.  4. Status post incision and drainage of right saphenous vein graft harvest     site in June 2004.  5. Hypertension.  6. Chronic obstructive pulmonary disease, on 2 L of nasal cannula oxygen.  7. Gout.  8. Obesity.  9. Gastroesophageal reflux disease.  10.      Benign prostatic hypertrophy.  11.      Continued alcohol abuse.  12.      History of glucose intolerance.  The patient says he does not have     diabetes mellitus, although it is listed in the history.  13.      Status post pacemaker approximately 12 years ago.   MEDICATIONS:  1. Lipitor 20 mg q.h.s.  2. Lisinopril 10 mg daily.  3.  Digitek 0.125 mg daily.  4. Allopurinol 300 mg daily.  5. Potassium chloride 20 mEq daily.  6. Pepcid 20 mg daily.  7. Furosemide 40 mg daily.  8. __________  2 mg q.h.s.  9. Toprol XL 25 mg daily.   ALLERGIES:  ASPIRIN.   FAMILY HISTORY:  The patient's mother is alive at age 49, health unknown.  Father deceased at age 13, of a myocardial infarction and colon cancer.  The  patient has three brothers, all have diabetes, and one sister who is  deceased from complications of diabetes at age 83.  The patient has three  daughters, all of whom he states are healthy.   SOCIAL HISTORY:  [place]  He has  one daughter who lives with him.  Her name is Daun.  He has three daughters  in all.  [name] is the patient's health care power of attorney.  He drinks 1/5  of whiskey every four to five days.  He denies tobacco and elicit drug use.  He is a retired [employer] manager.   REVIEW OF SYSTEMS:  The patient 's review of systems is positive for chronic  shortness of breath.  Otherwise his review of systems is negative.   PHYSICAL EXAMINATION:  VITAL SIGNS:  Temperature 98.7 degrees, blood  pressure 140/79, pulse 81, respirations 26, oxygen saturation 98% on 2 L.  GENERAL:  The patient is a pleasant obese 77 year old Caucasian man who is  currently lying in bed, in no acute distress.  HEENT:  Head is normocephalic, atraumatic.  Pupils equal, round, reactive to  light.  Extraocular movements are intact.  Conjunctivae clear.  Sclerae  white.  No proptosis or ptosis is noted.  Tympanic membranes are clear  bilaterally.  Nasal mucosa is moist, no drainage.  Oropharynx reveals a fair  dentition.  Multiple missing teeth.  No posterior exudates or erythema.  Mucous membranes are moist.  NECK:  Supple, no thyromegaly, no jugular venous distention, no bruits. LUNGS:  Clear to auscultation bilaterally, except he does have decreased  breath sounds in the bases.  HEART:  Distant S1, S2.  CHEST WALL:  The patient has a well-healed sternotomy scar.  He also has a  right upper chest wall pacemaker palpable.  No pain, no inflammation.  ABDOMEN:  Positive bowel sounds, obese, nontender, nondistended.  No  hepatosplenomegaly.  RECTAL:  Deferred.  GENITOURINARY:  Deferred.  EXTREMITIES:  The patient has a well-healed right inner thigh scar.  Pedal  pulses are barely palpable bilaterally.  No pretibial edema, no pedal edema.  NEUROLOGIC:  The patient is alert and oriented x3.  Cranial nerves II-XII  are intact.  Sensation is intact.  Strength is intact 5/5 throughout.  Cerebellar is intact.  Finger-to-nose bilaterally.  No pronator drift.  Plantar reflexes are downgoing.   ADMISSION LABORATORY DATA:  Electrocardiogram shows a paced rhythm, left  axis deviation, right bundle branch block.  Chest x-ray:  No acute disease, cardiomegaly.  The emergency department cardiac markers x3 were negative.  WBC 6.3,  hemoglobin 11.6, hematocrit 33.8, MCV 94, platelets 156.  PT 13.6, INR 1.1,  PTT 27.  Sodium 143, potassium 3.8, chloride 107, CO2 of 24, glucose 104,  BUN 14, creatinine 1.6.  Calcium 9.3, total protein 6.9, albumin 3.3, AST  27, ALT 30, alkaline phosphatase 64, total bilirubin 1.0, magnesium 1.8.   ASSESSMENT/PLAN:  1. Chest pain:  The patient does have coronary artery disease and     cardiomyopathy.  The patient will need to be admitted to rule out a     myocardial infarction.  He is currently comfortable on a nitroglycerin     drip.  2. Dizziness, most consistent with vertigo:  Will need to rule out intra-     cranial and extra-cranial abnormalities.  3. Anemia:  The patient's anemia is probably chronic.   PLAN:  1. The patient will be admitted to a telemetry bed for monitoring.  2. Dr. Aram Candela. Tysinger, the patient's cardiologist, has been consulted.  3. Collect cardiac enzymes q.8h. x3.  4. Continue nitroglycerin drip.  Will start the patient  on Lovenox at 90 mg     subcutaneously q.12h.  If he rules out for a myocardial infarction, will     discontinue the Lovenox.  5. Will continue the patient's chronic medications.  6. Will add morphine p.r.n. for extra pain relief.  7. Will check a digoxin level.  8. Will check vitamin B12, folate, ferritin, total iron and TIBC.  Will also     guaiac stools x3.  9. Will check orthostatic heart rate and blood pressures.  10.      Will check an MRI/MRA of the brain, as well as carotid Dopplers, to     evaluate the vertigo.  11.      Will add Antivert p.r.n. dizziness.  12.      Will check a hemoglobin A1c to evaluate for glucose intolerance.                                                Elliot Cousin, M.D.    DF/MEDQ  D:  08/18/2003  T:  08/18/2003  Job:  [id number]   cc:   Aram Candela. Aleen Campi, M.D.  [address]  [place]  [place] [address]  Fax: [phone]

## 2010-07-28 NOTE — Cardiovascular Report (Signed)
[facility]  Patient:    [name], [name]                   MRN: [id number] Proc. Date: 01/22/00 Adm. Date:  [id number] Attending:  Silvestre Mesi CC:         Cath Lab   Cardiac Catheterization  PROCEDURE: 1. Left heart catheterization. 2. Coronary cineangiography. 3. Left ventricular cineangiography. 4. Attempted angioplasty of the distal LAD through the LAD diagonal stent. 5. Perclose of the right femoral artery.  INDICATIONS:  This 77 year old male with a history of two vessel coronary artery disease was readmitted with severe anterior chest pain typical of his prior chest pain when he was admitted for total occlusion of his mid-LAD.  He states that the chest pain is similar to all his prior coronary events.  DESCRIPTION OF PROCEDURE:  After signing an informed consent, the patient was premedicated with 50 mg of Benadryl intravenously and brought to the cardiac catheterization lab.  His groin was prepped and draped in the usual sterile fashion and anesthetized locally with 1% lidocaine.  A #6 French introducer sheath was inserted percutaneously into the right femoral artery.  A 6 French #4 Judkins coronary catheters were used to make injections into the coronary arteries.  A 6 French pigtail catheter was used to measure pressures in the left ventricle and aorta and to make a midstream injection into the left ventricle.  After noting a severe stenosis in his distal LAD which was now open and was previously closed on his prior study, we elected to attempt to open the distal LAD approaching it through the LAD diagonal stent.  On his prior study in August of 2001, he had totally occluded his distal LAD and we were unable to reopen the distal LAD at that time.  A stent was placed in the mid-LAD and extending into his diagonal branch.  The continuation of the LAD was in the distal portion of this stent.  After discussing alternatives  with the patient, we elected to proceed with an attempt to pass a wire through the side of the stent and into the distal LAD.  We exchanged the 6 French sheath for a 7 French sheath in the right femoral artery.  Next, we selected a 7 [place] guide catheter which was advanced to the root of the aorta and engaged into the left coronary artery.  We then selected a Hi-Torque floppy guide wire which was advanced into the LAD and multiple attempts at passing this guide wire into the distal LAD were unsuccessful.  We then exchanged this guide wire for a traverse guide wire and again multiple attempts at passing the tip through the site of the stent and into the distal LAD were unsuccessful.  A final guide wire was used which was a 0.010 Approach guide wire and multiple attempts at crossing this smaller guide wire through the LAD diagonal stent and into the distal LAD were again unsuccessful.  Follow-up injections into the left coronary artery showed no evidence of dissection and retention of the same antegrade flow in the distal LAD.  No complications were noted, but no improvement was noted either.  At the end of the procedure, the catheter and sheath were removed from the right femoral artery and hemostasis was obtained with a Perclose closure system.  MEDICATIONS GIVEN:  Heparin 4000 units IV.  HEMODYNAMIC DATA:  Left ventricular pressure 101/0-13, aortic pressure 98/64 with mean 79, left ventricular ejection  fraction was measured at 40%.  CINE FINDINGS:  Coronary cineangiography.  Left coronary artery.  The ostium has a mild 20% stenosis.  The left main appears smooth and without significant stenosis.  Left anterior descending.  The LAD appears normal in its proximal segment. The middle segment where the stent was placed previously in the mid-LAD and extending into the diagonal branch now appears normal with very good flow and good distal runoff.  The mid and distal LAD now is open  and has Timi 2 flow. This distal LAD was totally occluded on his prior study and now appears with its takeoff in the side of the LAD diagonal stent.  There appears to be antegrade flow with a 95% stenosis.  The remainder of the LAD appears normal.   Circumflex coronary artery.  The circumflex now appears normal has a very good longterm appearance in the prior angioplasty site.  It now appears normal with normal flow.  Right coronary artery appears normal.  The right coronary artery is a large, dominant vessel supplying the posterior descending and posterolateral circulation.  There is a very minor plaque in its proximal segment and otherwise appears normal.  Left ventricular cineangiogram.  The left ventricular chamber size is mild to moderately enlarged.  The overall left ventricular contractility is mild to moderately decreased and the ejection fraction was measured at 40%.  There is normal contractility at the anterior base and inferior base.  The inferior segment appears normal.  The anteroapical segment has severe hypokinesia.  ANGIOPLASTY CINES:  Cines taken during the angioplasty procedure shows failure to advance the guide wire into the LAD through the LAD/diagonal stent.  The guide wire does go through the side of the stent in this area, but it appears to be in the vascular intema.  It does not appear to be within the true lumen. Final injections into the left coronary artery shows it to be unchanged with no improvement and with the same flow pattern as prior to the attempt.  FINAL DIAGNOSES: 1. Two vessel coronary artery disease. 2. Severe stenosis in the distal LAD which was previously closed and now open. 3. Very good appearance of the LAD/diagonal stent, status post cutting balloon    angioplasty on October 24, 1999. 4. Very good appearance of the circumflex angioplasty site. 5. Normal right coronary artery. 6. Moderate left ventricular dysfunction with ejection  fraction 40% and severe    anteroapical hypokinesia. 7. Successful Perclose of the right femoral artery.  8. Unsuccessful attempt to cross the distal LAD lesion by approaching it    through the LAD/diagonal stent. DD:  01/22/00 TD:  01/22/00 Job: [id number]

## 2010-07-28 NOTE — H&P (Signed)
NAMENACHMAN, SUNDT                      ACCOUNT NO.:  [id number]   MEDICAL RECORD NO.:  [id number]                   PATIENT TYPE:  INP   LOCATION:  1830                                 FACILITY:  [facility]   PHYSICIAN:  Ara D. Tammi Klippel, M.D.                DATE OF BIRTH:  1932/03/30   DATE OF ADMISSION:  03/29/2003  DATE OF DISCHARGE:                                HISTORY & PHYSICAL   PRIMARY CARE PHYSICIAN:  Dr. Aleen Campi   CHIEF COMPLAINT:  Chest pain.   HISTORY OF PRESENT ILLNESS:  The patient is a 77 year old white male who was  in his usual state of health when he was feeding his dogs this morning.  When he bent over, he felt a sudden episode of sharp left chest pain which  is somewhat reminiscent of his prior anginal equivalence.  The pain had no  provocative factors, was made better only when he came to the emergency room  a few hours later and received nitroglycerin and morphine for it.  He  describes it as a sharp, grabbing pain that was centered in his left chest  and radiated down his left upper extremity.  He states he has had severe  6/10 in pain, now currently a 0/10.  It was not associated with any  diaphoresis, shortness of breath or nausea.   ALLERGIES:  ASPIRIN.   MEDICATIONS:  1. Glipizide 5 mg p.o. daily.  2. Metoprolol XL 25 mg p.o. daily.  3. Potassium 20 mEq p.o. daily.  4. Lisinopril 10 mg p.o. daily.  5. Atorvastatin 10 mg p.o. daily.  6. Furosemide 40 mg p.o. daily.  7. Allopurinol 300 mg p.o. daily.  8. Famotidine 20 mg p.o. b.i.d.  9. Digoxin 0.125 mg p.o. daily.  10.      Doxazosin 2 mg p.o. q.h.s.  11.      Nitroglycerin 0.4 mg p.r.n.   PAST MEDICAL HISTORY:  1. Coronary artery disease status post myocardial infarction.  2. Congestive heart failure secondary to ischemic cardiomyopathy.  3. Hyperlipidemia.  4. Chronic obstructive pulmonary disease felt to be secondary to     occupational exposures.  5. Alcohol abuse.  6. Benign prostatic  hypertrophy.  7. History of upper gastrointestinal bleeding.  8. Gastroesophageal reflux disease.  9. Hypertension.  10.      Gout.  11.      Diabetes mellitus type 2.  12.      Chronic renal insufficiency.  13.      Sigmoid diverticula.   PAST SURGICAL HISTORY:  1. Status post pacer implantation.  2. Status post three vessel CABG in May 2004.  3. Status post right saphenous vein harvest site __________ June 2004.   SOCIAL HISTORY:  The patient denies tobacco.  He does admit to alcohol use,  drinking as much as a fifth of whiskey every 2-3 days.  He denies any  illicit or IV drug use.  He lives in [place] by himself.  He has one dog,  and he has city water.  The patient is a retired [employer] manager.   FAMILY HISTORY:  Mother alive at age 51 of unknown health.  Father deceased  at age 80 for an MI and colon cancer.  The patient has three brothers, all  whom have diabetes, and one sister who is deceased from complications of  diabetes at age 75.  The patient has three daughters, all of whom he states  are healthy.   REVIEW OF SYSTEMS:  The patient admits to orthopnea where he has to be  reclined at 45 degrees; however, this has been unchanged.  Chest pain as  dictated above.  Dyspnea on exertion which he says is unchanged as well as  shortness of breath, just at rest, which he states has forearm and  intermittent lower extremity swelling.   PHYSICAL EXAMINATION:  VITAL SIGNS:  Temperature 97.8, pulse 80,  respirations 20, blood pressure 124/74, pulse oximetry 99% on 2 liters nasal  cannula.  GENERAL APPEARANCE:  Well-developed, well-nourished 77 year old white male  speaking in full sentences in no apparent distress.  HEENT:  Head normocephalic, atraumatic without alopecia.  Eyes:  Pupils  equal, round and reactive to light.  Extraocular muscles intact.  Anicteric.  No injection.  No discharge.  Normal appearing conjunctivae.  Ears:  TM's  clear bilaterally.  Nose:  No  dry blood in the nares.  Mouth:  Mucous  membranes moist.  Uvula midline.  Oropharynx without erythema or exudate.  There are no oral lesions or ulcers.  The patient has upper and lower  dentures.  NECK:  Supple without lymphadenopathy, thyromegaly, bruit or JVD.  There are  no meningeal signs appreciated.  LUNGS:  Clear to percussion and auscultation bilaterally without rhonchi,  rales or wheezes.  HEART:  Very distant heart sounds but regular rate and rhythm, S1, S2.  The  patient has a well-healed pacer implantation site and median sternotomy site  is well healed.  ABDOMEN:  Obese, nondistended, bowel sounds present, nontender, no guarding,  no rebound, no hepatosplenomegaly.  EXTREMITIES:  No cyanosis, clubbing or edema.  Well healed saphenous vein  graft sites appreciated.  RECTAL:  Normal tone enlarged and quite hard feeling prostate, stool is heme  negative.  NEUROLOGICAL:  Cranial nerves II-XII grossly intact.  There is no focal or  gross motor or sensory deficits appreciated.   LABORATORY DATA:  He had a pH of 7.469, PCO2 35.8, CO2 26, hemoglobin 12.9,  hematocrit 38, PT 13.8, INR 1.1, PTT 33, sodium 142, potassium 3.8, chloride  105, BUN 15, creatinine 0.7, glucose 94.  Serial MB, troponins and  myoglobins are all negative.  Hemoccult blood is negative.  EKG showing a  rate of 80, QRS 213, QTC prolonged at 528 with extreme right axis deviation  at 270.  It is a ventricular paced rhythm without any discrete atrial  activity appreciated.   ASSESSMENT/PLAN:  A 77 year old white male with chest pain:  1. NEUROLOGICAL/PSYCHOLOGICAL:  No signs and symptoms of meningitis,     encephalitis or cerebrovascular accident.  The patient appears euthymic     currently.  The patient has a long-standing alcohol history.  Will     monitor for signs and symptoms of withdrawal.   1. PULMONARY:  Continue the patient on 2 liters of oxygen as his baseline.    If need be, will increase his  oxygen for comfort.  Currently  no active     issues.   1. CARDIOVASCULAR:  Will observe the patient overnight, place him on     telemetry, cycle his enzymes q.6h., check his fasting lipids and continue     on his current blood pressure medication.  At this time, with the     patient's listed allergy, we will refrain from starting him on aspirin.     If there appears to be no immediate ischemic event, we will refrain from     restarting the patient Clopidogrel.  Will check an a.m. EKG.   1. RENAL:  The patient has mild chronic renal insufficiency with a     calculated creatine clearance of approximately 63 ml per minute.  Will     renally dose medications when appropriate and avoid nephrotoxins.   1. GASTROINTESTINAL:  No active issues.   1. FLUIDS, ELECTROLYTES, NUTRITION.  Med-lock his IV fluids.  Keep his     potassium and magnesium about 4 and 2, respectively, and start the     patient on AHA-1 diet.   1. ID:  No active issues.   1. HEMATOLOGY/ONCOLOGY:  Will check a PSA.   1. ENDOCRINE:  Will Accucheck the patient q.a.c. and q.h.s.  Continue him on     his outpatient sulfonylurea and write him for a sliding scale insulin.     If necessary, will increase his glipizide.   1. PROPHYLAXIS:  The patient will be full p.o. for GI prophylaxis and he     will be ambulatory for DVT prophylaxis.   DISPOSITION:  The patient is full code.                                                Ara D. Tammi Klippel, M.D.    ADM/MEDQ  D:  03/29/2003  T:  03/30/2003  Job:  [id number]   cc:   Aram Candela. Aleen Campi, M.D.  [address]  [place]  [place] [address]  Fax: [phone]

## 2010-07-28 NOTE — Op Note (Signed)
NAMEMILBURN, FREENEY                      ACCOUNT NO.:  [id number]   MEDICAL RECORD NO.:  [id number]                   PATIENT TYPE:  AMB   LOCATION:  ENDO                                 FACILITY[facility]  [facility]   PHYSICIAN:  Georgiana Spinner, M.D.                 DATE OF BIRTH:  06-06-1932   DATE OF PROCEDURE:  03/26/2003  DATE OF DISCHARGE:                                 OPERATIVE REPORT   PROCEDURE:  Colonoscopy.   INDICATIONS:  Rectal bleeding.   ANESTHESIA:  Versed 5 mg.   DESCRIPTION OF PROCEDURE:  With the patient mildly sedated in the left  lateral decubitus position, the Olympus videoscopic colonoscope was inserted  into the rectum  and passed under direct vision to the cecum -- identified  by the ileocecal valve and appendiceal orifice, both of which are  photographed.  From this point the colonoscope was slowly withdrawn, taking  circumferential views of the colonic mucosa and stopping only as we pulled  back to the rectum at 60 cm from the anal verge.  At that point a polyp was  seen, photographed and removed using snare cautery technique (setting of  20/20 blended current).  The tissue was retrieved for pathology.  The scope  was then, as noted, withdrawn to the rectum -- which appeared normal in  direct and showed hemorrhoids on retroflexed view.  The scope was  straightened and withdrawn.  The patient's vital signs and pulse oximetry  remained stable.  The patient tolerated the procedure well and all without  apparent complications.   FINDINGS:  1. Internal hemorrhoids.  2. Diverticulosis of the sigmoid colon.  3. Polyp at 60 cm removed.   PLAN:  Await biopsy report.  The patient will call me for results and follow  up with me as an outpatient.                                               Georgiana Spinner, M.D.    GMO/MEDQ  D:  03/26/2003  T:  03/26/2003  Job:  [id number]

## 2010-07-28 NOTE — Discharge Summary (Signed)
[name], [name]                      ACCOUNT NO.:  [id number]   MEDICAL RECORD NO.:  [id number]                   PATIENT TYPE:  INP   LOCATION:  3742                                 FACILITY:  [facility]   PHYSICIAN:  [name], [name]                 DATE OF BIRTH:  Aug 05, 1932   DATE OF ADMISSION:  09/10/2003  DATE OF DISCHARGE:  09/16/2003                                 DISCHARGE SUMMARY   ADMISSION DIAGNOSES:  1. Syncopal episode x2.  2. Ischemic cardiomyopathy.  3. Diabetes mellitus.   DISCHARGE DIAGNOSES:  1. Status post syncopal episodes with depressed ejection fraction.  2. Ischemic cardiomyopathy, status post evaluation by Dr. Graciela Husbands of EP and     subsequent AICD placement.  No recurrent syncopal episodes while in the     hospital.  Noted to have significant orthostasis on admission, and was     hydrated with IV normal saline.  3. Ischemic cardiomyopathy, status post revascularization in May 2004.  4. History of post vein graft harvest, complicated with MRSA-positive     infection in June 2004.  5. History of hypertension.  6. History of chronic obstructive pulmonary disease, on 2 L nasal cannula     chronically.  7. History of gout.  8. OBC.  9. Gastroesophageal reflux disease.  10.      Benign prostatic hypertrophy.  11.      Alcohol abuse.  12.      Implantable pacer placed 12 years ago.   DISCHARGE MEDICATIONS:  The patient was instructed to stop Cardura.  He is  instructed to continue:  1. Toprol XL 25 mg q.d.  2. Lasix 40 mg q.d.  3. Potassium 20 mEq q.d.  4. Digitek 0.125 mg q.d.  5. Lipitor 20 mg q.d.  6. Antivert as before.  7. Pepcid as before.  8. Allopurinol as before.   DISCHARGE INSTRUCTIONS:  He was instructed on orthostatic precautions and  instructed to maintain a __________ carbohydrate diet.  In regards to his  status post AICD wound, he is instructed to follow up with his primary  physician (Dr. Charolette [name]) on  September 20, 2003 at  4:30 p.m.  The patient is instructed to follow up with Dr.  Sharrell [name] in three months.  He is instructed to call [phone] for follow-  up appointment.   LABORATORY DATA:  At the time of this dictation:  Hemoglobin 11.0, platelet  count 165, WBC 9.  Sodium 135, BUN 13, creatinine 1.1, glucose 86.   HISTORY OF PRESENT ILLNESS:  The patient is a 77 year old Caucasian male,  with history of ischemic heart disease with depressed EF and O2-dependent  COPD.  He had developed episode of syncope after walking about 12 feet.  He  had some prodromal vision disturbance with dizziness and fell.  His  relatives stated that because of the panic they were not sure he was  unconscious,  approximate maybe at two minutes.  He got up and seemed okay,  and was brought home.  However, when he got out of the car a second episode  occurred and he was eventually brought to the emergency room.  He had  recently been evaluated in the hospital and discharged for vertigo and chest  pain; an acute MI was ruled out at the time.  Discharge on August 19, 2003.   HOSPITAL COURSE:  [name] was evaluated, with again no evidence of acute  ischemic event.  He underwent monitoring on telemetry and without  arrhythmia.  Cardiology followed throughout the course, and no arrhythmias  were noted.  He was hydrated for orthostasis and had episode of mild  congestive heart failure.  He was diuresed back to __________ status.   He was evaluated by EP for syncopal episodes in the setting of ischemic  heart disease and depressed EF.  Subsequently he had AICD placed.   DISPOSITION:  His hospitalization was without complication.  He is being  discharged in stable condition.  He has been ambulating in the halls  comfortably without evidence of orthostasis or syncopal events.                                                [name], [name]    ESS/MEDQ  D:  09/16/2003  T:  09/16/2003  Job:  [id number]   cc:   [name],  M.D.  [address]  [place]  [place] [address]  Fax: [phone]

## 2010-07-28 NOTE — Discharge Summary (Signed)
[name], [name]                ACCOUNT NO.:  [id number]   MEDICAL RECORD NO.:  [id number]          PATIENT TYPE:  INP   LOCATION:  4737                         FACILITY:  [facility]   PHYSICIAN:  Isidor Holts, M.D.  DATE OF BIRTH:  [birth year]   DATE OF ADMISSION:  05/31/2006  DATE OF DISCHARGE:  06/04/2006                               DISCHARGE SUMMARY   PAST MEDICAL HISTORY:  [name] Char, MD   DISCHARGE DIAGNOSES:  1. Congestive heart failure decompensation.  2. Coronary artery disease/ischemic cardiomyopathy, ejection fraction      20%.  3. Oxygen dependent chronic obstructive pulmonary disease.  4. Dyslipidemia.  5. Type 2 diabetes mellitus.  6. Chronic anticoagulation.  7. Hypertension.   DISCHARGE MEDICATIONS:  1. Allopurinol 300 mg p.o. daily.  2. Digoxin 125 mcg p.o. daily.  3. Glipizide 5 mg p.o. daily.  4. Lasix 40 mg p.o. b.i.d. (was on 40 mg p.o. daily).  5. Lipitor 20 mg p.o. daily.  6. Lisinopril 10 mg p.o. daily.  7. Pepcid 20 mg p.o. daily.  8. Toprol XL 25 mg p.o. daily.  9. K-Dur 20 mEq p.o. daily.  10.Imdur 30 mg p.o. daily.  11.Coumadin per INR. Currently on 4 mg p.o. q.6:00pm. daily.   PROCEDURE:  Chest x-ray dated May 31, 2006.  This showed no change in  moderate cardiomegaly with pulmonary vascular congestion.   CONSULTATIONS:  None.   ADMISSION HISTORY:  See H&P notes of May 31, 2006 by [name],  [name] However, in brief, this is a 77 year-old male, with known history  of coronary artery disease status post 3-vessel CABG in May 2004,  ischemic cardiomyopathy with EF of 20% status post AICD/Pacer,  dyslipidemia, gout, O2 dependent chronic lung disease, who presents with  increasing shortness of breath/orthopnea of approximately 24 hours  duration.  On initial evaluation, the BNP was noted to be 1226 and chest  x-ray showed pulmonary vascular congestion.  The patient appeared to  have gained approximately 3 pounds since his last  hospitalization in  March 2007.  He was admitted for further evaluation, investigation, and  management.   CLINICAL COURSE BY PROBLEMS:  Problem #1:  DECOMPENSATED CONGESTIVE  HEART FAILURE.  For the details of presentation refer to admission  history above.  The patient has a known history of ischemic  cardiomyopathy, against a background of CAD, with an EF of 20%.  He was  managed with intravenous Lasix initially, as well beta blockers and ACE  inhibitor, with satisfactory clinical response.  His dyspnea  ameliorated, orthopnea resolved; and as of June 03, 2006 his BNP had  dropped to 146.   Problem #2:  CHRONIC LUNG DISEASE, O2 DEPENDENT.  The patient continued  on oxygen supplementation during this hospitalization and was stable  with adequate saturations, on supplemental oxygen concentration of 2 L  per minute.   Problem #3:  CORONARY ARTERY DISEASE.  This is likely contributory to #1  above, although the patient had no evidence of chest pain during the  course of his hospitalizatio, and cardiac enzymes remained unelevated.  Nitrates were added to his treatment.  Of note, the patient is allergic  to ASPIRIN.   Problem #4:  HYPERTENSION.  This was adequately controlled with a  combination of beta-blockers, diuretics, and ACE  inhibitor treatment.   Problem #5:  GOUT.  This remained asymptomatic throughout the course of  the patient's hospitalization.  He continued on prophylaxis with  Allopurinol.   Problem #6:  ANTICOAGULATION.  This was supervised by clinical  pharmacologist during the course of the patient's hospitalization.  INR  was initially supratherapeutic at 3.8 on May 30, 2006; and Coumadin  was therefore, held.  However, by June 03, 2006 INR had dropped to 2.2,  i.e. therapeutic; and the patient was restarted on Coumadin, at an  initial dose of 4 mg p.o. q.6:oo pm.  We anticipate the patient will  have INR we checked on June 06, 2006, and have his Coumadin  dosage  adjusted as appropriate.  We understand that he usually follows up with  Dr. [name] at [facility], for adjustment of his  Coumadin dosage.  We anticipate that this will continue.   Problem #7:  Type 2 diabetes mellitus.  Patient has remained euglycemic  on pre-admission dose of Glipizide, as well as carbohydrate modified  diet, throughout the course of hospitalization.   Problem #8:  DYSLIPIDEMIA.  The patient continues on Statin treatment.   DISPOSITION:  The patient has done very well in terms of response to  heart failure treatment, during the course of this hospitalization, and  he has been reinstated on oral diuretic medication.  Providing no acute  problems arise overnight, it is anticipated that he will be discharged  on June 04, 2006, as he is now significantly recovered and stable.   DIET:  Heart healthy/carbohydrate modified diet.   ACTIVITY:  As tolerated Recommended to increase activity slowly.   FOLLOWUP INSTRUCTIONS:  the patient is recommended to followup with his  primary MD, Dr. Aleen Campi, within one week at discharge. He is to call  for an appointment.  He is also to follow-up with Dr. [name] for INR  check and adjustment of Coumadin dosage.  It is recommended that his INR  be checked on June 06 2006.   SPECIAL INSTRUCTIONS:  Patient is to continue on his home oxygen  supplementation  at 2 L per minute, titrated upwards if needed.  He  assures [name] that he has bronchodilator nebulizers for p.r.n. use.      Isidor Holts, M.D.  Electronically Signed     CO/MEDQ  D:  06/03/2006  T:  06/03/2006  Job:  [id number]   cc:   [name] Char, MD  [name], MD

## 2010-07-28 NOTE — Discharge Summary (Signed)
[name], [name]                ACCOUNT NO.:  [id number]   MEDICAL RECORD NO.:  [id number]          PATIENT TYPE:  INP   LOCATION:  4714                         FACILITY:  [facility]   PHYSICIAN:  Hillery Aldo, M.D.   DATE OF BIRTH:  Aug 21, 1932   DATE OF ADMISSION:  06/26/2006  DATE OF DISCHARGE:                               DISCHARGE SUMMARY   PRIMARY CARE PHYSICIAN:  Dr. Charolette Child.   DISCHARGE DIAGNOSES:  1. Ischemic cardiomyopathy with recurrent congestive heart failure,      plans to implant a biventricular pacemaker.  2. Two-vessel coronary artery disease.  3. Chronic atrial fibrillation.  4. Diabetes.  5. Gastroesophageal reflux disease.  6. Dyslipidemia.  7. Hypertension.  8. Mild obstructive airway disease status post pulmonary function      testing.  9. History of gout.  10.Obesity.  11  Benign prostatic hypertrophy.  1. ASPIRIN ALLERGY.   DISCHARGE MEDICATIONS:  To be dictated at the time of actual discharge.   CONSULTATIONS:  1. Dr. [name] of cardiology  2. Dr. Chales Abrahams there also of cardiology.   PROCEDURES AND DIAGNOSTIC STUDIES:  1. Chest x-ray on June 26, 2006 showed cardiomegaly and interstitial      edema.  2. Chest x-ray on June 27, 2006 showed stable mild edema pattern,      bronchial thickening and bibasilar scarring.  There was a small      left effusion versus pleural thickening and no new findings noted.  3. Pulmonary function testing done on June 27, 2006 showed minimal      obstructive airways disease of the peripheral airways.  There was      no significant response to bronchodilator therapy.  FVC was 1.97,      FEV-1 1.53, FEV-1 to FVC ratio 77.  4. Cardiac catheterization on July 01, 2006 showed two-vessel      coronary artery disease with the left main being 50-60% blocked,      the obtuse marginal branch unchanged, proximal stenosis of diagonal      branch - unchanged, and mid LAD stenosis - unchanged.  The RCA was      normal.   There was normal obtuse marginal vein graft and normal      diagonal vein graft.  There is dilated cardiomyopathy with severe      apical akinesia.   DISCHARGE LABORATORY VALUES:  To be dictated at the time of actual  discharge.   HOSPITAL COURSE BY PROBLEM:  1. Congestive heart failure secondary to ischemic cardiomyopathy:  The      patient was admitted and diuresed.  Cardiac enzymes were cycled.      There was no elevation.  Given that there was no clear cause for      the patient's CHF exacerbation, Dr. Aleen Campi was consulted for      consideration of biventricular pacemaker.  The patient underwent      further diagnostic testing with the findings as noted above and      plans to place a biventricular pacemaker later on today.  2. Chronic atrial fibrillation:  The  patient has a history of chronic      atrial fibrillation for which an original pacemaker was placed many      years ago.  This controlled his rate well.  He is anticoagulated.  3. Diabetes:  The patient's diabetes has been well controlled through      the course of his hospitalization.  4. Gastroesophageal reflux disease:  Patient has been asymptomatic on      proton pump inhibitor therapy.  .  5. Dyslipidemia:  The patient has been maintained on statin therapy.  6. Hypertension:  The patient's hypertension has been well controlled.  7. Two-vessel coronary artery disease:  The patient is currently      medically managed.  Repeat catheterization was done with findings      as noted above.  He has been followed closely by the cardiologist      and has not had any evidence of an acute MI.  8. Obstructive airway disease:  The patient did have pulmonary      function testing done with findings as noted above.  He does not      respond to bronchodilator therapy.  His obstructive airways disease      is minimal.   DISPOSITION:  The patient is planning to have a biventricular pacemaker  placed later today.  His disposition  will be set once this is  accomplished, and he has adequately been observed afterward.      Hillery Aldo, M.D.  Electronically Signed     CR/MEDQ  D:  07/02/2006  T:  07/02/2006  Job:  [id number]   cc:   [name], MD

## 2010-07-28 NOTE — Consult Note (Signed)
NAMEALDRIC, [name]                      ACCOUNT NO.:  [id number]   MEDICAL RECORD NO.:  [id number]                   PATIENT TYPE:  INP   LOCATION:  3702                                 FACILITY:  [facility]   PHYSICIAN:  Mikey Bussing, M.D.           DATE OF BIRTH:  1932/04/05   DATE OF CONSULTATION:  08/03/2002  DATE OF DISCHARGE:                                   CONSULTATION   REQUESTING PHYSICIAN:  John R. Aleen Campi, M.D.   PRIMARY CARE PHYSICIAN:  [facility].   REASON FOR CONSULTATION:  Unstable angina, left main and severe two-vessel  disease.   CHIEF COMPLAINT:  Chest pain.   HISTORY OF PRESENT ILLNESS:  I was asked to evaluate this 76 year old white  male for potential surgical coronary revascularization for recently  diagnosed significant left main and two-vessel coronary artery disease.  The  patient has a long history of coronary disease and status post MI in 1999  when he had a stent placed in the circumflex by Dr. Aleen Campi.  He  subsequently had a percutaneous stent placed in the LAD diagonal in 2001.  He had been doing well until recently when he developed progressive  substernal chest pain on exertion, progressing to resting nocturnal angina  associated with shortness of breath and shoulder pain.  The patient was  admitted to the hospital 48 hours ago for chest pain to rule out for MI.  He  underwent cardiac catheterization by Dr. Aleen Campi, which demonstrated a  normal right coronary.  The left main ostium had a 60-80% stenosis.  The mid  LAD had a 90% stenosis at the diagonal.  The circumflex was patent.  Ejection fraction was 35% with an LVEDP of 12 mmHg. Based on his bad  coronary anatomy including left main stenosis and the symptoms of unstable  angina he was felt to be a candidate for surgical coronary  revascularization.   PAST MEDICAL HISTORY:  1. Cardiac rhythm disturbance status post transvenous ventricular pacemaker,     revised times two.  2. Chronic lung disease on home oxygen from cotton dust exposure, smoking     and chronic bronchitis.  3. Allergy to ASPIRIN with severe rash.  4. Gout.  5. CHF, treated medically.  6. Obesity.  7. Hypertension.   ALLERGIES:  ASPIRIN.   CURRENT HOME MEDICATIONS:  1. Digoxin 0.25 mg daily.  2. Lasix 40 mg p.o. b.i.d.  3. Lipitor 20 mg p.o. daily.  4. Plavix 75 mg daily.  5. Aldactone 25 mg daily.  6. Allopurinol 300 mg daily.  7. Lisinopril 10 mg p.o. daily.  8. Pepcid 20 mg daily.  9. Lopressor 50 mg p.o. daily.  10.      Cardura 10 mg p.o. daily.   SOCIAL HISTORY:  The patient is widowed times four to five months. He lives  alone.  His daughter, [name], lives close by as well as his older daughter,  Houston Lions.  He does not drive.  He is on home oxygen.  He has not had alcohol in  four years, but used to drink heavily.  He states he never smoked  significantly.   FAMILY HISTORY:  Negative for coronary disease, diabetes, clots or  hypertension.   REVIEW OF SYSTEMS:  CONSTITUTIONAL:  The patient denies any constitutional  symptoms of recent weight loss or fever.  HEENT:  Review is negative for  recent change in vision and headache.  He does have frequent dizzy spells,  especially on changing positions.  He denies any active acute dental  problems.  He denies dysphagia.  PULMONARY:  Review is positive for his home  oxygen therapy for chronic lung disease.  His chest x-ray on admission was  consistent with bronchitis.  CARDIAC:  Review is positive for transvenous  ventricular pacemaker and his previous coronary interventions with reduced  LV function and left main stenosis.  GASTROINTESTINAL:  Review is positive  for some GERD, negative for recent change in bowel habits and blood per  rectum.  UROLOGIC:  Review is negative for BPH, hematuria or kidney stones.  HEMATOLOGIC:  Review is negative for bleeding disorder or blood transfusion.  ENDOCRINE:  Review is negative for diabetes or  thyroid disease.  VASCULAR:  Review is negative for DVT, TIA or claudication.  The patient's pre-CABG  Doppler exam demonstrates no significant carotid disease.  ABIs are 0.9  bilaterally and his brachial artery pressures are equal bilaterally.  PSYCHIATRIC:  Review is negative for depression, insomnia or diminished  appetite.   PHYSICAL EXAMINATION:  VITAL SIGNS:  The patient is 5 feet 4 inches and  weighs 215 pounds.  He is afebrile.  Blood pressure is 123/62 and heart rate  is 80 and regular, ventricularly paced.  GENERAL APPEARANCE:  Is that of a middle-aged white male in his hospital  room in no distress.  HEENT:  Exam is normocephalic.  He has some mild skin changes of psoriasis  over his face and neck.  He has several missing teeth, but the remaining  teeth do not appear to be loose.  NECK:  Without JVD, mass or carotid bruit.  LUNGS:  With scattered rhonchi.  No thoracic deformity noted.  CARDIAC:  Exam is regular rhythm with S3, gallop or murmur.  ABDOMEN EXAMINATION:  Obese without palpable mass or tenderness.  EXTREMITIES:  Reveal no clubbing, cyanosis or edema.  Peripheral pulses are  2+ in the radial, 1-2+ femoral and trace in the pedal exam.  There is no  venous insufficiency noted in his lower extremities.  RECTAL:  Exam is deferred.  SKIN:  Without rash other than the psoriasis-like rash on his face and neck.  NEUROLOGIC EXAMINATION:  The patient is alert and oriented times three and  has full motor function.   LABORATORY DATA:  The patient's pre-CABG Doppler showed no significant  carotid disease with ABI of 0.9 in both lower extremities.  Palmar arch  study indicates some abnormalities.  He is right hand dominant.   RECOMMENDATIONS:  The patient would benefit from surgical revascularization  due to his severe left main disease.  We will plan on placing left IMA graft to his LAD and vein grafts to the diagonal-1, possibly the diagonal-3 and to  the circumflex  marginal.   I have discussed the rationale and benefits of coronary bypass surgery for  treatment of his coronary disease as well as the alternatives therapies.  I  discussed with the patient the  major aspects of the proposed procedure  including the choice of conduit for grafting, the location of the surgical  incisions, the use of general anesthesia and cardiopulmonary bypass, and the  expected postoperative hospital recovery period.  I have reviewed with the  patient the risks to him of this operation including the risks of MI, CVA,  bleeding, wound infection, pneumonia, and death.  He understands with his  longstanding use of Plavix he would be at increased risk for perioperative  bleeding and blood transfusion requirement.  He understands the implications  for the surgery and agrees to proceed with the operation as planned under  what I feel is an informed consent.  The surgery will be scheduled for  Wednesday, May 26th.   Thank you very much for this consultation.                                                 Mikey Bussing, M.D.    PV/MEDQ  D:  08/04/2002  T:  08/04/2002  Job:  [id number]   cc:   CVTS Office   Charolette Child, M.D.  [facility]   [facility]

## 2010-07-28 NOTE — Assessment & Plan Note (Signed)
[facility]                           ELECTROPHYSIOLOGY OFFICE NOTE   NAME:[name]                         MRN:          [id number]  DATE:11/20/2005                            DOB:          1933-02-21    HISTORY OF PRESENT ILLNESS:  [name] is seen.  He had syncope in the  setting of ischemic heart disease and depressed LV function.  He has had no  recurrent syncope and no intercurrent discharges.  Medications are reviewed  and are unchanged.   PHYSICAL EXAMINATION:  VITAL SIGNS:  Blood pressure 119/81, pulse 67.  He  was wearing his oxygen.  LUNGS:  Clear.  HEART:  Sounds were regular.  EXTREMITIES:  Without edema.   STUDIES:  Interrogation of his [employer] ICD demonstrates an R wave of 12  and a threshold of 0.5 and 0.5.  The lead impedence was about 450, high  voltage impedence at implant was 40.   IMPRESSION:  1. Syncope.  2. Ischemic heart disease with depressed LV function.  3. Status post ICD for the above.  4. Permanent atrial fibrillation.   PLAN:  [name] is stable currently, we will plan on seeing him again in  one years time, and he is being monitored by house call remotely in the  interim.                                   Duke Salvia, MD, [facility]   SCK/MedQ  DD:  11/20/2005  DT:  11/21/2005  Job #:  [id number]   cc:   [name], MD

## 2010-07-28 NOTE — Op Note (Signed)
[name], [name]                      ACCOUNT NO.:  [id number]   MEDICAL RECORD NO.:  [id number]                   PATIENT TYPE:  INP   LOCATION:  2024                                 FACILITY:  [facility]   PHYSICIAN:  Kerin Perna III, M.D.           DATE OF BIRTH:  1932-04-03   DATE OF PROCEDURE:  08/27/2002  DATE OF DISCHARGE:                                 OPERATIVE REPORT   PREOPERATIVE DIAGNOSIS:  Infected right thigh wound from previous endoscopic  vein harvest.   POSTOPERATIVE DIAGNOSIS:  Infected right thigh wound from previous  endoscopic vein harvest.   OPERATIONS:  1. Incision, drainage, and debridement of right thigh wound.  2. Application of Wound-V.A.C. system.  3. Placement of subclavian triple-lumen catheter.   SURGEON:  Kerin Perna, M.D.   ANESTHESIA:  General by Sheldon Silvan, M.D.   INDICATIONS:  The patient is a 77 year old type 2 diabetic who underwent  coronary artery bypass grafting x3 on May 24 with endoscopic vein harvest  from the right thigh.  He returned to the emergency room this afternoon with  a temperature of 100.7 and a red, tender, warm, fluctuant right thigh.  He  was brought to the operating room for incision, drainage, debridement, and  placement of a Wound-V.A.C. system.  Prior to surgery I discussed the  procedure with the patient as well as his two daughters in the emergency  department, and they understood the rationale for the surgery, the major  aspects of what we planned to do, and the expected postoperative recovery as  well as the associated complications of bleeding, continued infection, and  possible loss of limb.   PROCEDURE:  The patient was brought to the operating room and placed supine  on the operating table, where general anesthesia was induced using special  precautions to avoid aspiration.  The right leg was prepped and draped as a  sterile field.  An incision was mad directly over the point of maximum  fluctuance and redness, and immediately pus drained out under pressure.  This was cultured both aerobic and anaerobic cultures.  The thigh was then  opened to expose the entire length of the endoscopic tunnel.  There was a  deep area of purulent material with some associated indurated tissue.  The  necrotic tissue was sharply debrided.  The entire wound was irrigated with  copious amounts of warm saline and vancomycin irrigation.  Hemostasis was  obtained with electrocautery.  A Wound-V.A.C. was then applied and suction  was adjusted to -125 mmHg.   Next the left shoulder was prepped and draped as a sterile field.  Using a  percutaneous-placed guidewire, the left subclavian vein was cannulated and  the triple-lumen catheter placed via guidewire into the superior vena cava.  The  chest x-ray is pending in the recovery room.  The catheter was secured to  the skin with two silk sutures.  A sterile dressing was  applied.   The patient then returned to the recovery room in stable condition after  being extubated.                                               Mikey Bussing, M.D.    PV/MEDQ  D:  08/27/2002  T:  08/31/2002  Job:  [id number]

## 2010-07-28 NOTE — Discharge Summary (Signed)
[name], SCHROEPFER NO.:  [id number]   MEDICAL RECORD NO.:  [id number]          PATIENT TYPE:  INP   LOCATION:  2009                         FACILITY:  [facility]   PHYSICIAN:  [name], MD    DATE OF BIRTH:  December 06, 1932   DATE OF ADMISSION:  02/17/2005  DATE OF DISCHARGE:  02/21/2005                                 DISCHARGE SUMMARY   FINAL DIAGNOSIS:  1.  Congestive heart failure, now compensated.  2.  Chest pain, non-cardiac.  3.  History of coronary artery disease status post coronary artery bypass      graft surgery.  4.  ICD with VVI pacing.   REASON FOR ADMISSION:  This 77 year old male was admitted on February 17, 2005, complaining of chest pain and increasing shortness of breath of sudden  onset.  He went to the emergency room  where he was evaluated and found to  be in congestive heart failure.  He has a past history of coronary artery  disease, dilated cardiomyopathy with an ICD, he has had atrial fibrillation  and brady arrhythmia for many years with a VVI pacer prior to his dilated  cardiomyopathy and need for the ICD.   HOSPITAL COURSE:  On admission, the patient was found to have an elevated  BNP and his cardiac enzymes were negative.  He was given diuresis and  continued on his digitalis and beta blocker.  He responded quickly with  improved breathing and no further chest pain.  His chest pain changed to a  soreness and tenderness in his chest wall and he tolerated increased  activity without further shortness of breath.  With the chest pain and  change in his congestive failure status, we obtained a Persantine Myoview  study on February 20, 2005.  The Persantine stimulation part was negative  and Myoview study was negative for reversible ischemia.  The radionuclear  study did show an old anterior septal infarction and an ejection fraction of  28%, this was essentially unchanged from his prior study.  He had no further  chest pain but still  complained of the soreness in his upper left chest with  local tenderness.  We discussed the possible need of BiV pacer with Dr.  Graciela Husbands and he recommended that this be done if his congestive heart failure  continued to be a clinical problem.  He also recommended that the patient  could be discharged home at that time and given another medical trial before  changing his ICD to a BiV ICD pacing unit.  With no further shortness of  breath and good compensation of his congestive heart failure with a negative  Persantine Myoview test for reversible ischemia, we felt that he was stable  for discharge for another medical trial of his congestive failure.  He was  then discharged in improved, stable condition.   DISPOSITION ON DISCHARGE:  The plan is for a medical trial management of his  congestive failure.  If further decompensation occurs, then he should be  strongly considered a candidate for a BiV pacing unit in  addition to his  ICD.   FOLLOW UP:  In our office in one week.   DISCHARGE INSTRUCTIONS:  Activity as tolerated.  Diet heart healthy diet.   DISCHARGE MEDICATIONS:  Allopurinol 300 mg daily, digoxin 0.125 mg daily,  Pepcid 20 mg daily, Furosemide 40 mg daily, Glipizide 5 mg daily, Lisinopril  10 mg daily, Toprol 25 mg daily, Simvastatin 40 mg daily, and Coumadin as  directed.   CONDITION ON DISCHARGE:  Improved and stable.      [name], MD  Electronically Signed     JRT/MEDQ  D:  04/04/2005  T:  04/04/2005  Job:  [id number]

## 2010-07-28 NOTE — Op Note (Signed)
[name], [name] NO.:  [id number]   MEDICAL RECORD NO.:  [id number]          PATIENT TYPE:  AMB   LOCATION:  ENDO                         FACILITY:  [facility]   PHYSICIAN:  Georgiana Spinner, M.D.    DATE OF BIRTH:  August 22, 1932   DATE OF PROCEDURE:  12/05/2004  DATE OF DISCHARGE:                                 OPERATIVE REPORT   PROCEDURE:  Upper endoscopy.   INDICATIONS:  Hemoccult positivity.   ANESTHESIA:  Demerol 20, Versed 4 milligrams.   PROCEDURE:  With the patient mildly sedated in the left lateral decubitus  position, the Olympus videoscopic endoscope was inserted and passed under  direct vision through the esophagus which appeared normal until we reached  the distal esophagus and there was a question of one area of Barrett's  photographed and biopsied.  We entered into the stomach. The fundus, body,  antrum, and duodenal bulb, second portion duodenum were visualized. From  this point, the endoscope was slowly withdrawn taking circumferential views  of duodenal mucosa until the endoscope had been pulled back into the stomach  and placed in retroflexion in the stomach from below and a hiatal hernia was  noted. The endoscope was straightened and withdrawn taking circumferential  views of the remaining gastric and esophageal mucosa. The patient's vital  signs and pulse oximetry remained stable. The patient tolerated the  procedure well without apparent complications.   FINDINGS:  Question of Barrett's esophagus above a hiatal hernia. Await  biopsy report. The patient will call me for results and follow up with me as  an outpatient. Proceed to colonoscopy as planned.           ______________________________  Georgiana Spinner, M.D.     GMO/MEDQ  D:  12/05/2004  T:  12/05/2004  Job:  [id number]

## 2010-07-28 NOTE — Procedures (Signed)
[facility]  Patient:    [name], [name]                         MRN: [id number] Proc. Date: 01/08/00 Adm. Date:  [id number] Disc. Date: [id number] Attending:  Silvestre Mesi CC:         Dr. Barton Fanny, [facility]   Procedure Report  PROCEDURE:  Colonoscopy with polypectomy and biopsy.  INDICATION FOR PROCEDURE:  Heme-positive stools and occasional rectal bleeding.  DESCRIPTION OF PROCEDURE:  The patient was placed in the left lateral decubitus position and placed on the pulse monitor with continuous low-flow oxygen delivered by nasal cannula.  He was sedated with 40 mg IV Demerol and 2 mg IV Versed.  The Olympus video colonoscope was inserted into the rectum and advanced to the cecum, confirmed by transillumination at McBurneys point and visualization of the ileocecal valve and appendiceal orifice.  The prep was fairly good with some areas requiring significant lavage to allow adequate visualization.  I could not rule out small lesions less than 0.5 cm in all areas despite this.  The cecum itself appeared normal.  The ileocecal valve had a somewhat granular appearance to it.  There were some nodular areas that I could not tell whether represented adherent stool that could not be washed off or some sort of exudate associated with a possible superficial neoplastic lesion such as a diffuse spreading adenoma.  Biopsies were taken, and the texture of the valve appeared normal.  The ascending colon appeared normal with no masses, polyps, diverticula, or other mucosal abnormalities otherwise. In the transverse, descending, sigmoid colon, there were numerous polyps, approximately six, all less than or equal to 1 cm in diameter, and all sessile, and these were fulgurated by hot biopsy.  There were a few scattered sigmoid diverticula.  There were two or three minute polyps in the rectum that I did not fulgurate.  These were all less than 4 mm  in diameter.  The remainder of the rectum appeared normal.  Retroflexed view of the anus revealed no obvious internal hemorrhoids.  The colonoscope was then withdrawn, and the patient returned to the recovery room in stable condition.  He tolerated the procedure well, and there were no immediate complications.  IMPRESSION: 1. Transverse, descending, and sigmoid colon polyps. 2. Sigmoid diverticulosis.  PLAN:  Await histology for determination of method and interval for future colon screening. DD:  01/08/00 TD:  01/08/00 Job: [id number]

## 2010-07-28 NOTE — Op Note (Signed)
NAMEMANN, SKAGGS NO.:  [id number]   MEDICAL RECORD NO.:  [id number]          PATIENT TYPE:  AMB   LOCATION:  ENDO                         FACILITY:  [facility]   PHYSICIAN:  Georgiana Spinner, M.D.    DATE OF BIRTH:  14-Nov-1932   DATE OF PROCEDURE:  12/05/2004  DATE OF DISCHARGE:                                 OPERATIVE REPORT   PROCEDURE:  Colonoscopy.   INDICATIONS:  Hemoccult positivity.   ANESTHESIA:  Demerol 40 milligrams, Versed 2 milligrams.   PROCEDURE IN DETAIL:  With the patient mildly sedated in the left lateral  decubitus position, a rectal examination was performed which was  unremarkable other than external hemorrhoids. The prostate felt normal.  Subsequently, the Olympus videoscopic colonoscope was inserted in the rectum  and passed under direct vision through a rather poor prep. We reached the  cecum identified by ileocecal valve and base of cecum. The appendiceal  orifice could never be well seen because of solid stool in the cecum that  would not wash and suction. So, from this point, the colonoscope was then  slowly withdrawn taking circumferential views of the colonic mucosa stopping  to photograph examples of the poor prep along the way until we reached the  rectum which appeared normal and directly showed hemorrhoids on retroflexed  view. The endoscope was straightened and withdrawn. The patient's vital  signs and pulse oximetry remained stable. The patient tolerated the  procedure well without apparent complications.   FINDINGS:  Internal hemorrhoids, otherwise, an unremarkable examination but  limited by the prep with solid stool in numerous places that limited our  view.   PLAN:  See endoscopy note.  Will have the patient follow up with me as an  outpatient and discuss this further at that time.           ______________________________  Georgiana Spinner, M.D.     GMO/MEDQ  D:  12/05/2004  T:  12/05/2004  Job:  [id number]

## 2010-07-28 NOTE — H&P (Signed)
[name]. [facility]  Patient:    [name], [name] [facility] Visit Number: [id number] MRN: [id number]          Service Type: MED Location: [phone] Attending Physician:  Pamella Pert Dictated by:   Abelino Derrick, P.A.C. [facility] Admit Date:  02/08/2001                           History and Physical  CHIEF COMPLAINT:  Lower extremity edema and mild shortness of breath.  HISTORY OF PRESENT ILLNESS:  The patient is a 77 year old male followed by Dr. Aleen Campi with a history of coronary disease.  He had previous intervention in May of 1999, LAD stenting in June of 1999, LAD intervention again in January of 2000, and his last cath was in November of 2001 with an intervention to the LAD.  He has ischemic cardiomyopathy with 32% ejection fraction.  He has a history of a pacemaker.  He has had intermittent admissions for volume overload.  He was last admitted for this in March of 2002.  In July of 2002, he had a kidney stone and was seen in the emergency room.  He usually is sedentary and does not leave his house.  Yesterday he decided to go for a walk.  He developed some weakness and fatigue, and has noted that his legs were swelling lately, and thought he should come to the emergency room to be evaluated.  He was seen by Dr. Jola Babinski in the emergency room and it was felt to best to admit him for diuresis.  His chest x-ray did show mild congestive failure.  The patient was admitted to 4700 telemetry.  He did get Lasix 80 mg IV.  Seen the morning of December 1, he is doing well.  He denies chest pain or shortness of breath.  PAST MEDICAL HISTORY: 1. Coronary disease. 2. Pacemaker. 3. Ischemic cardiomyopathy as noted above. 4. History of gout. 5. History of reflux. 6. He has chronic obstructive pulmonary disease and is on home oxygen.  He is    not a smoker but apparently worked in a [employer]. 7. He did have a kidney stone in July of 2002.  CURRENT  MEDICATIONS: 1. Lipitor 20 mg a day. 2. Plavix 75 mg a day. 3. Pepcid 20 mg a day. 4. Allopurinol 300 mg a day. 5. Aldactone 25 mg every other day. 6. Digitek 0.25 q.d. 7. Lasix 20 mg a day. 8. Lisinopril 10 mg twice a day. 9. Doxazosin 2 mg a day.  ALLERGIES:  ASPIRIN.  SOCIAL HISTORY:  He is married and lives with his wife.  He is essentially disabled and not very active.  He used to drink fairly heavily but says he has not had alcohol in two months.  FAMILY HISTORY:  Father died at age 22, he had a history of peripheral vascular disease and prostate cancer.  A sister has a history of diabetes. His mother is alive at age 52.  REVIEW OF SYSTEMS:  Essentially unremarkable except for noted above.  He denies any fever or chills or significant sounding chest pain, or orthopnea. He does have chronic shortness of breath at home.  Also of note, he did have elevated LFTs when he was in the emergency room in July of 2002 of 77 and 97 respectively.  PHYSICAL EXAMINATION:  VITAL SIGNS:  Blood pressure 136/74, temperature 97.4, pulse 68, weight is 208 pounds after 80 mg of  Lasix given last night in the emergency room.  GENERAL:  He is a disheveled male in no acute distress.  HEENT:  Normocephalic.  Extraocular movements are intact.  Sclerae are nonicteric.  Lids and conjunctivae are within normal limits.  NECK:  Without JVD and without bruit.  CHEST:  Clear to auscultation and percussion.  CARDIAC:  Regular rate and rhythm without murmur, rub or gallop.  Normal S1 and S2.  ABDOMEN:  Obese and nontender.  No hepatosplenomegaly.  EXTREMITIES:  Trace edema bilaterally.  Distal pulses are faint.  There is no femoral artery bruits noted.  NEUROLOGIC:  Grossly intact.  He is awake, alert, oriented and cooperative. He moves all extremities without obvious deficit.  His chest x-ray from admission showed mild congestive failure.  IMPRESSION: 1. Volume overload and mild congestive  failure. 2. Ischemic cardiomyopathy with an ejection fraction of 32% in the past. 3. Coronary disease with multiple interventions in the past, last intervention    was in November of 2001. 4. PT/VDP in the past for bradycardia. 5. Gout. 6. Reflux. 7. Chronic obstructive pulmonary disease, apparently industrial induced, on    home oxygen. 8. Nephrolithiasis. 9. Elevated liver function tests in July of 2002 with a history of alcohol. [name]  PLAN: 1. Will check follow-up labs this morning including LFTs. 2. It is possible he could be discharged later today and follow-up with Dr.    Aleen Campi. Dictated by:   Abelino Derrick, P.A.C. [facility] Attending Physician:  Pamella Pert DD:  02/09/01 TD:  02/09/01 Job: [id number]

## 2010-08-29 ENCOUNTER — Emergency Department (HOSPITAL_COMMUNITY): Payer: PRIVATE HEALTH INSURANCE

## 2010-08-29 ENCOUNTER — Emergency Department (HOSPITAL_COMMUNITY)
Admission: EM | Admit: 2010-08-29 | Discharge: 2010-08-29 | Disposition: A | Discharge status: Home / Self Care | Payer: PRIVATE HEALTH INSURANCE | Attending: Emergency Medicine | Admitting: Emergency Medicine

## 2010-08-29 DIAGNOSIS — E119 Type 2 diabetes mellitus without complications: Secondary | ICD-10-CM | POA: Insufficient documentation

## 2010-08-29 DIAGNOSIS — I509 Heart failure, unspecified: Secondary | ICD-10-CM | POA: Insufficient documentation

## 2010-08-29 DIAGNOSIS — I2510 Atherosclerotic heart disease of native coronary artery without angina pectoris: Secondary | ICD-10-CM | POA: Insufficient documentation

## 2010-08-29 DIAGNOSIS — Z95810 Presence of automatic (implantable) cardiac defibrillator: Secondary | ICD-10-CM | POA: Insufficient documentation

## 2010-08-29 DIAGNOSIS — Z8679 Personal history of other diseases of the circulatory system: Secondary | ICD-10-CM | POA: Insufficient documentation

## 2010-08-29 DIAGNOSIS — R0789 Other chest pain: Secondary | ICD-10-CM | POA: Insufficient documentation

## 2010-08-29 DIAGNOSIS — E876 Hypokalemia: Secondary | ICD-10-CM | POA: Insufficient documentation

## 2010-08-29 DIAGNOSIS — I10 Essential (primary) hypertension: Secondary | ICD-10-CM | POA: Insufficient documentation

## 2010-08-29 DIAGNOSIS — R079 Chest pain, unspecified: Secondary | ICD-10-CM

## 2010-08-29 DIAGNOSIS — I517 Cardiomegaly: Secondary | ICD-10-CM | POA: Insufficient documentation

## 2010-08-29 DIAGNOSIS — D649 Anemia, unspecified: Secondary | ICD-10-CM | POA: Insufficient documentation

## 2010-08-29 DIAGNOSIS — I252 Old myocardial infarction: Secondary | ICD-10-CM | POA: Insufficient documentation

## 2010-08-29 LAB — CK TOTAL AND CKMB (NOT AT ARMC)
CK, MB: 1.6 ng/mL (ref 0.3–4.0)
CK, MB: 1.5 ng/mL (ref 0.3–4.0)
Relative Index: INVALID (ref 0.0–2.5)
Total CK: 46 U/L (ref 7–232)

## 2010-08-29 LAB — COMPREHENSIVE METABOLIC PANEL
Albumin: 3.3 g/dL — ABNORMAL LOW (ref 3.5–5.2)
BUN: 11 mg/dL (ref 6–23)
Creatinine, Ser: 1 mg/dL (ref 0.50–1.35)
Potassium: 2.9 mEq/L — ABNORMAL LOW (ref 3.5–5.1)
Total Protein: 6.6 g/dL (ref 6.0–8.3)

## 2010-08-29 LAB — PROTIME-INR
INR: 1.77 — ABNORMAL HIGH (ref 0.00–1.49)
Prothrombin Time: 20.9 seconds — ABNORMAL HIGH (ref 11.6–15.2)

## 2010-08-29 LAB — CBC
Hemoglobin: 10.4 g/dL — ABNORMAL LOW (ref 13.0–17.0)
MCH: 31.6 pg (ref 26.0–34.0)
RBC: 3.29 MIL/uL — ABNORMAL LOW (ref 4.22–5.81)

## 2010-08-29 MED ORDER — IOHEXOL 300 MG/ML  SOLN
75.0000 mL | Freq: Once | INTRAMUSCULAR | Status: AC | PRN
  Administered 2010-08-29: 75 mL via INTRAVENOUS

## 2010-09-14 ENCOUNTER — Encounter: Payer: PRIVATE HEALTH INSURANCE | Admitting: *Deleted

## 2010-09-18 ENCOUNTER — Encounter: Payer: Self-pay | Admitting: *Deleted

## 2010-09-20 ENCOUNTER — Other Ambulatory Visit: Payer: Self-pay | Admitting: Internal Medicine

## 2010-09-20 ENCOUNTER — Ambulatory Visit (INDEPENDENT_AMBULATORY_CARE_PROVIDER_SITE_OTHER): Payer: PRIVATE HEALTH INSURANCE | Admitting: *Deleted

## 2010-09-20 DIAGNOSIS — I4891 Unspecified atrial fibrillation: Secondary | ICD-10-CM

## 2010-09-20 DIAGNOSIS — Z95810 Presence of automatic (implantable) cardiac defibrillator: Secondary | ICD-10-CM

## 2010-09-20 DIAGNOSIS — I428 Other cardiomyopathies: Secondary | ICD-10-CM

## 2010-09-28 IMAGING — CT CT ABDOMEN W/O CM
2 of 4 series · 17 of 46 positions shown, 19 images · non-contrast
Comparison: 03/06/2008

CT ABDOMEN

CLINICAL DATA: Right flank and anterior abdominal pain today.
History of kidney stones.

CT OF THE ABDOMEN AND PELVIS WITHOUT CONTRAST (CT UROGRAM)
TECHNIQUE: Multidetector CT imaging was performed through the
abdomen and pelvis to include the urinary tract.

[Series 2: stone stud 5.0 b31f st · axial · 0.73mm/px · z∈[-436,-12]mm · 14 of 93 slices shown, 16 images]
[im 4/93  soft-tissue]
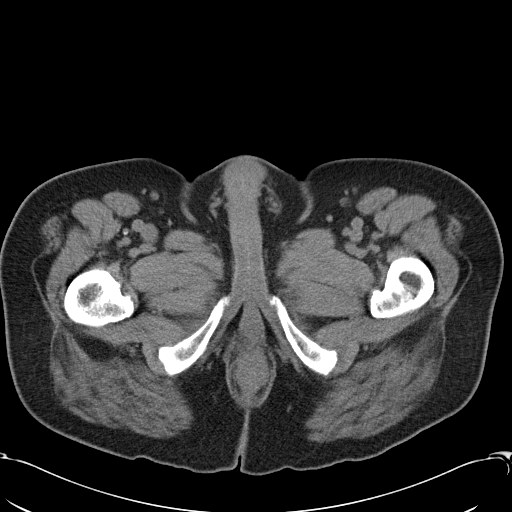
[im 4/93  bone]
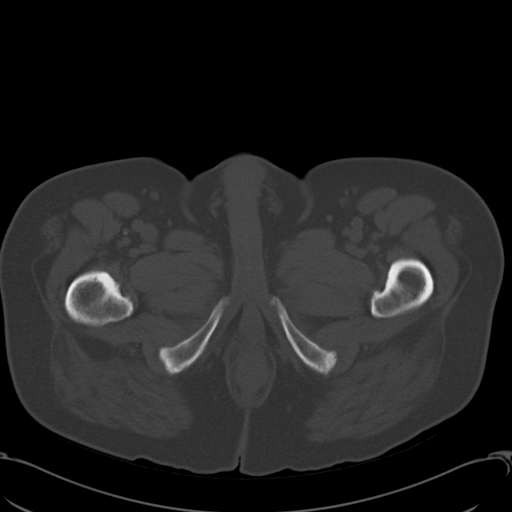
[im 12/93  soft-tissue]
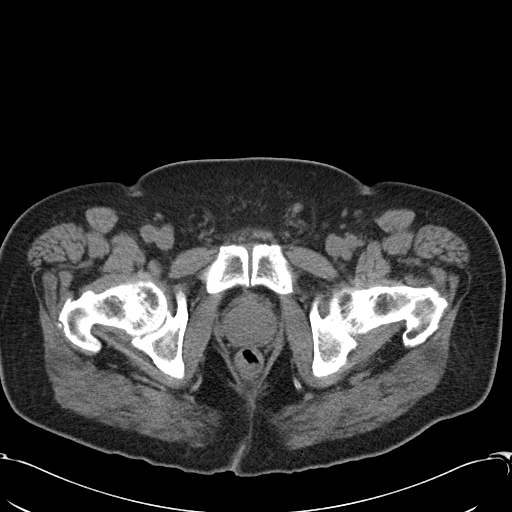
[im 20/93  soft-tissue]
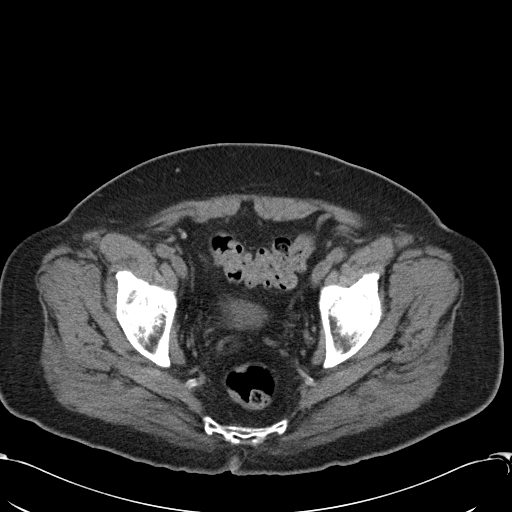
[im 24/93  soft-tissue]
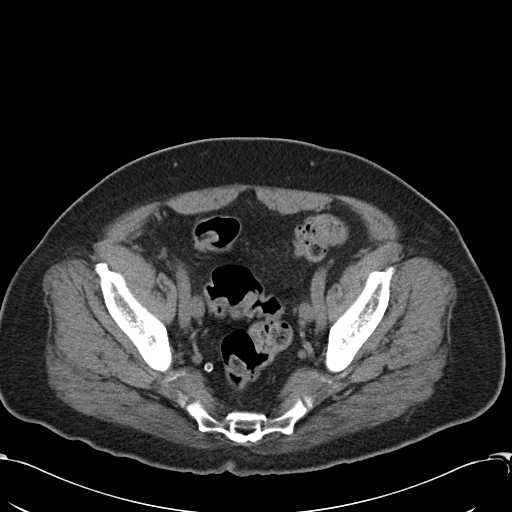
[im 31/93  soft-tissue]
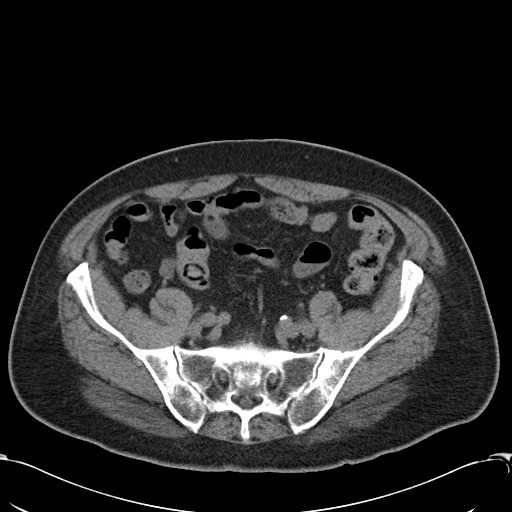
[im 39/93  soft-tissue]
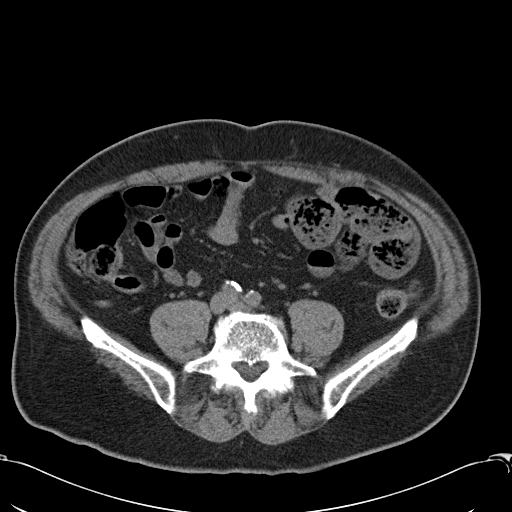
[im 43/93  soft-tissue]
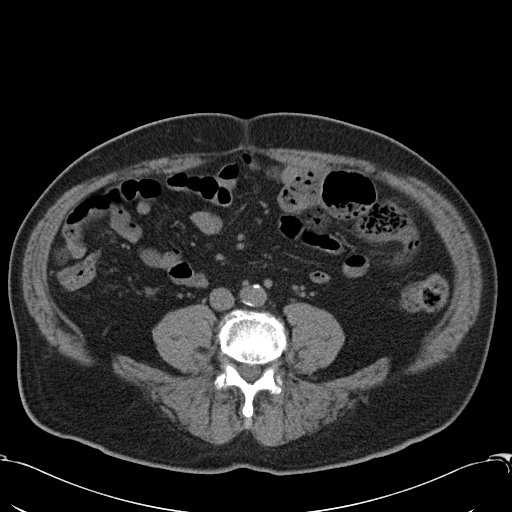
[im 50/93  soft-tissue]
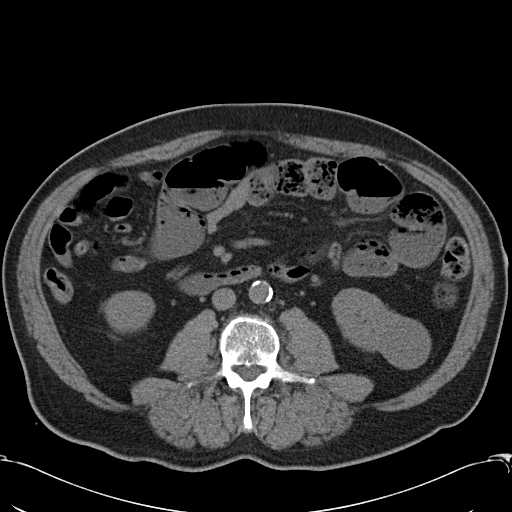
[im 54/93  soft-tissue]
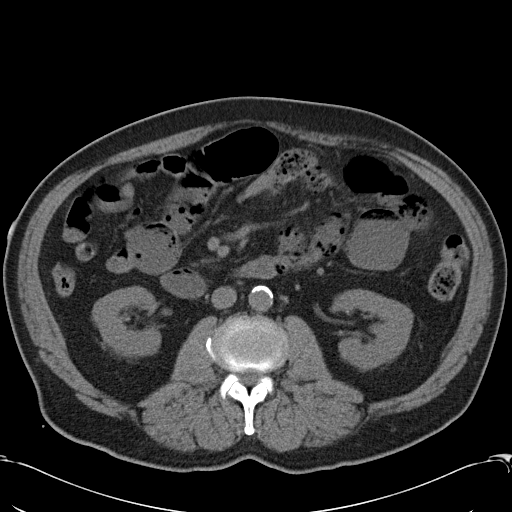
[im 54/93  bone]
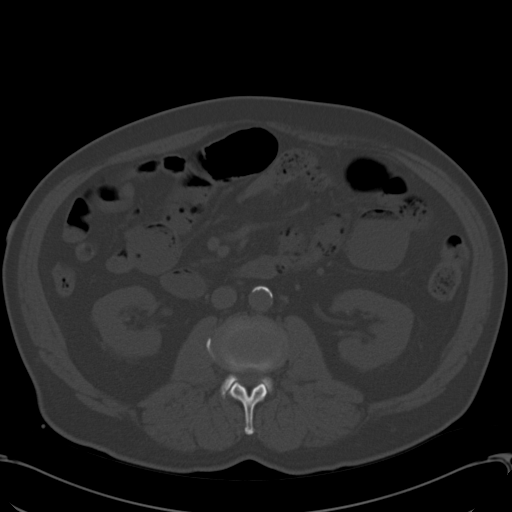
[im 62/93  soft-tissue]
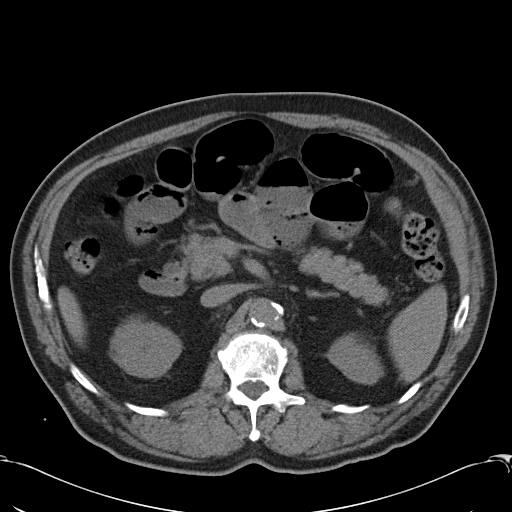
[im 70/93  soft-tissue]
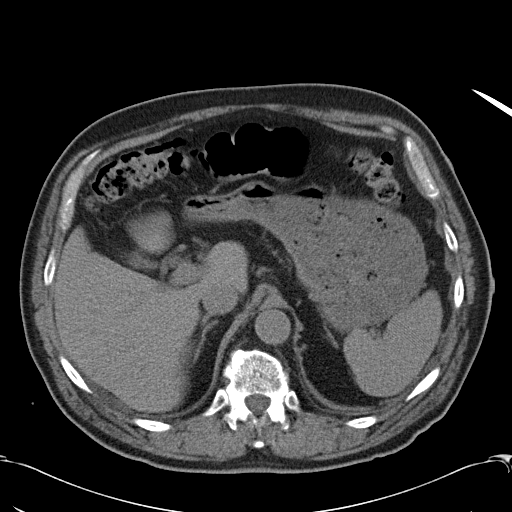
[im 73/93  soft-tissue]
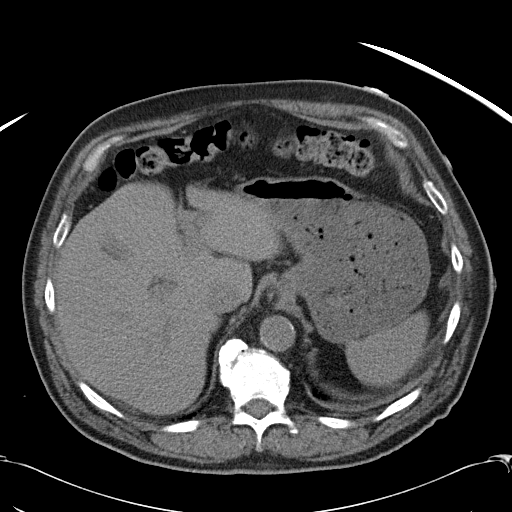
[im 81/93  soft-tissue]
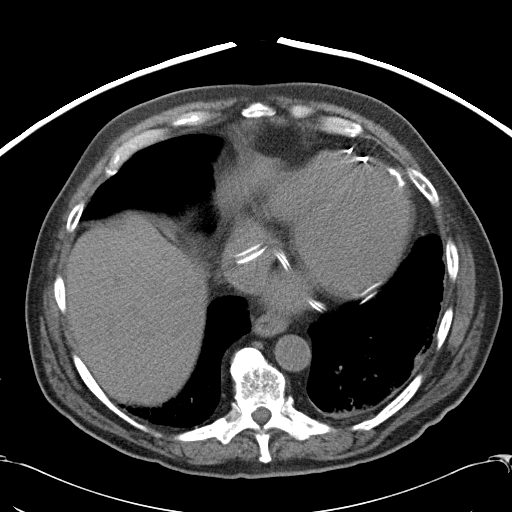
[im 89/93  soft-tissue]
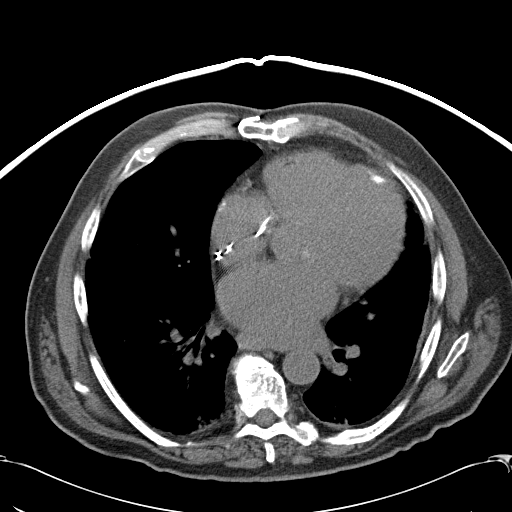

[Series 602: cor a/p · coronal · 0.91mm/px · 3 of 89 slices shown]
[im 30/89  soft-tissue]
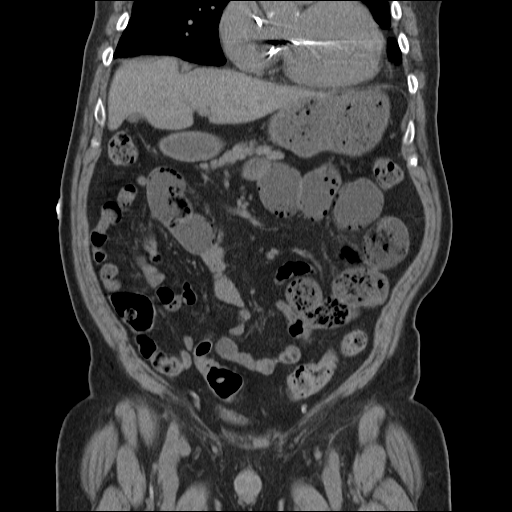
[im 40/89  soft-tissue]
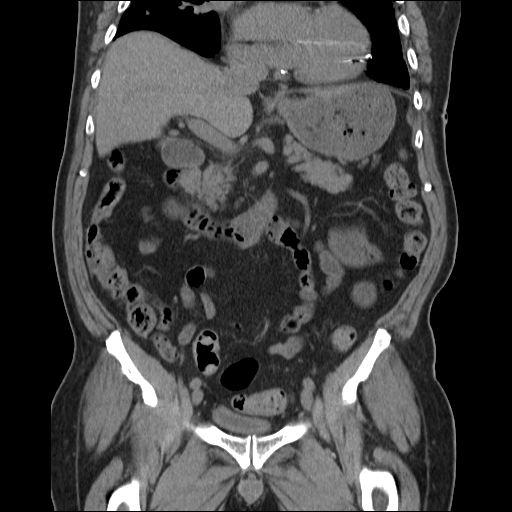
[im 49/89  soft-tissue]
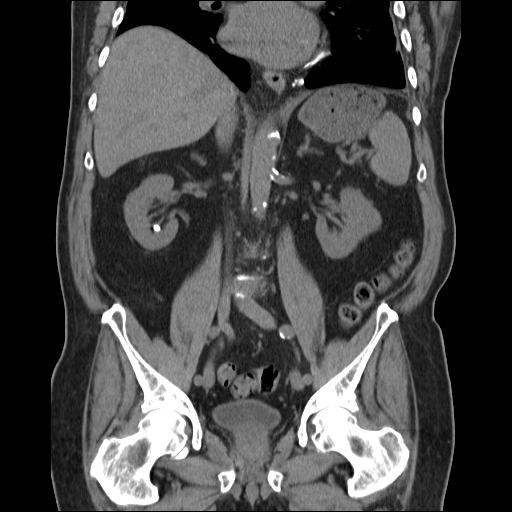

[17 of 46 positions shown; findings below may reference images not displayed]

FINDINGS: The patient has developed a prominent Schmorl's node in
the superior plate of L4 consistent with a superior endplate
fracture.  This does not compress the anterior margin of the
vertebra.

The unenhanced liver, spleen, pancreas, and adrenal glands are
normal.  There is a stable 7 mm stone in the lower pole of the
right kidney and there is a stable 3.8 cm cyst in the lower pole of
the left kidney.  There is no hydronephrosis.

There is no free air or free fluid.  There are some minimally
distended loops of small bowel in the mid abdomen, essentially
unchanged since the prior CT scan.
IMPRESSION: No acute abnormalities.  Schmorl's node in the superior endplate of
L4, new since the prior exam.

CT PELVIS
FINDINGS: The distal ureters are normal.  The prostate gland is
not enlarged.  Terminal ileum and appendix are normal.  No
significant bony abnormality.  No free fluid or other significant
abnormalities.
IMPRESSION: Benign-appearing pelvis.

## 2010-09-28 NOTE — Consult Note (Signed)
[name], [name]  MEDICAL RECORD NO.:  [id number]  LOCATION:  [facility]                         FACILITY:  [facility]  PHYSICIAN:  Marca Ancona, MD      DATE OF BIRTH:  10/28/1932  DATE OF CONSULTATION:  08/29/2010 DATE OF DISCHARGE:  08/29/2010                                CONSULTATION   PRIMARY CARDIOLOGIST:  [name]. [name], MD  PRIMARY CARE PROVIDER:  Massie Maroon, MD  REASON FOR CONSULTATION:  Chest pain.  HISTORY OF PRESENT ILLNESS:  This is a 77 year old Caucasian gentleman with history of coronary artery disease status post coronary artery bypass grafting in 2004 with most recent catheterization in 2008 showing anatomy and graft unchanged from 2004 as well as status post AICD placement for complete heart block and ischemic cardiomyopathy with an ejection fraction of 25%, hypertension, and diabetes mellitus who states he was in his usual state of health until waking this morning to left- sided chest/shoulder pain.  Pain is not pressure or stabbing but more just painful per the patient.  The pain is above his pacer site around his left shoulder and to the base of the left side of his neck.  The pain is worse when he turns his head to the right as well as worse with extending his left arm across the front of his chest.  The patient denies having this pain previously.  He typically walks approximately 30 feet or so up the hill to feed dogs every day and he felt he was more short of breath this morning than normal.  He denies any fevers, chills, nausea, or vomiting or diaphoresis.  He denies weakness or syncope as well as no palpitations.  When he was walking to feed his dogs, the pain did not increase.  His daughter spoke with Dr. Selena Batten, his primary care provider, who thought he should come to the emergency department for further evaluation.  In the emergency department, the patient remains with pain.  The patient has been given morphine  and nitroglycerin.  His cardiac enzymes are negative x2.  The patient underwent a CT angio of the chest that showed no pulmonary embolus.  Of note, the patient's potassium is low at 2.9 and he has been given 40 mEq to replete.  Cardiology was asked to evaluate the patient further.  PAST MEDICAL HISTORY: 1. Ischemic cardiomyopathy, ejection fraction 25% per echo. 2. Systolic congestive heart failure, ejection fraction 25%. 3. Complete heart block status post [employer] biventricular Promote     device replaced in 2008.  Initial pacemaker placed in 1976 per the     patient. 4. Coronary artery disease status post coronary artery bypass grafting     in 2004.     a.     LIMA to LAD.  SVG to diagonal and SVG to obtuse marginal.     b.     Status post cardiac catheterization in 2008.  Unchanged from      prior study in 2004.  Dilated cardiomyopathy with severe      anteroapical and inferoapical akinesis with basilar contraction      intact. 5. COPD. 6. Chronic  atrial fibrillation, on chronic Coumadin therapy. 7. History of CVA. 8. Hypertension. 9. Calcified thrombus in the left ventricle per echo in March 1012. 10.Non-insulin-dependent diabetes mellitus. 11.History of partial small bowel obstruction. 12.History of pulmonary asbestos. 13.Gout. 14.Nephrolithiasis. 15.Status post left rotator cuff surgery.  SOCIAL HISTORY:  The patient lives at home with his daughter.  He denies tobacco abuse.  He occasionally drinks alcohol that includes liquor and beer.  FAMILY HISTORY:  Noncontributory for premature coronary artery disease. His father passed away at the age of 75 from a myocardial infarction and had colon cancer.  He has siblings with diabetes mellitus.  ALLERGIES:  ASPIRIN causing hives.  HOME MEDICATIONS: 1. Coumadin 2.5 mg, use as directed. 2. Omeprazole 20 mg daily. 3. Nitroglycerin sublingual 0.4 mg one tablet under the tongue at the     onset of chest pain, may repeat every  5 minutes up to three doses     as needed. 4. Toprol-XL 25 mg daily. 5. Lisinopril 20 mg daily. 6. Lipitor 20 mg daily. 7. Digoxin 0.125 mg daily. 8. Isosorbide mononitrate XR 30 mg daily. 9. Furosemide 40 mg twice daily. 10.Allopurinol 300 mg daily.  REVIEW OF SYSTEMS:  Pertinent positives and negatives as stated in the HPI.  Other systems have been reviewed and are negative.  PHYSICAL EXAMINATION:  VITAL SIGNS:  Temperature 97.7, pulse 63, respirations 16, blood pressure 122/65, O2 saturation 100% on 2 liters. GENERAL:  This is a well-developed, well-nourished elderly gentleman. He is in no acute distress. HEENT:  Normal. NECK:  Supple without bruit or JVD. HEART:  Regular rate and rhythm with S1 and S2.  No murmur or gallop noted.  Pulses 2+ and equal bilaterally.  Left shoulder area and distal portion of left side of the neck tender to palpation. LUNGS:  Clear to auscultation bilaterally without wheezes, rales, or rhonchi. ABDOMEN:  Soft, nontender, positive bowel sounds x4. EXTREMITIES:  No clubbing, cyanosis, or edema. MUSCULOSKELETAL:  No joint deformities or effusions. NEUROLOGIC:  Alert and oriented x3, cranial nerves II through XII grossly intact.1. CT angiogram showing no evidence of acute pulmonary embolus.  There     is cardiomegaly with calcified left ventricular apex consistent     with prior LV infarct.     a.     Pleural and parenchymal opacities in the right mid lung      peripherally, may represent scarring and changes of asbestos with      some pleural plaques which are calcified.  Recommend followup CT      in 6 months.     b.     Slightly mediastinal prominence, mainly prevascular lymph      nodes, recommend six-month followup. 2. Chest x-ray showing stable moderate cardiac silhouette enlargement.     Post CABG and AICD.  Elevation of the right hemidiaphragm with     minimal right basilar atelectasis.  Slight thickening of lateral     aspect of the minor  fissure.  Minimal atelectasis in the left base.     Slight blunting of left costophrenic angle. 3. EKG showing ventricularly paced rhythm at 62 beats per minute.  LABORATORY DATA:  WBC 5.7, hemoglobin 10.4, hematocrit 30.7, platelets 171.  Sodium 139, potassium 2.9, chloride 98, bicarb 30, BUN 11, creatinine 1, glucose 125.  INR 1.77.  Cardiac enzymes negative x2.  ASSESSMENT AND PLAN:  This is a 77 year old Caucasian gentleman with an extensive cardiac history who presents with left-sided chest and shoulder pain.  The  patient's pain is atypical for cardiac etiology and I suspect it is musculoskeletal in a patient with history of left rotator cuff surgery.  The pain in the left shoulder, is tender to palpation at the joint as well as turning the neck.  At this time, I doubt this is cardiac related.  The patient's cardiac enzymes have been negative as well as a CT angio of the chest without pulmonary embolus. At this time, the patient is stable to be discharged from a cardiac standpoint.  He is going to follow up with his orthopedic surgeon.  With the patient's hypokalemia, he has been given a total of 80 mEq of potassium in the emergency department.  He has been given a prescription for potassium chloride 20 mEq twice daily.  He will have a lab draw next week at Dr. Elmyra Ricks office.     Leonette Monarch, PA-C   ______________________________ Marca Ancona, MD    NB/MEDQ  D:  08/29/2010  T:  08/30/2010  Job:  [id number]  cc:   Massie Maroon, MD [name]. [name], MD  Electronically Signed by Alen Blew P.A. on 09/03/2010 05:03:07 PM Electronically Signed by Marca Ancona MD on 09/28/2010 01:29:07 PM

## 2010-09-29 LAB — REMOTE ICD DEVICE
ATRIAL PACING ICD: 1 pct
BATTERY VOLTAGE: 2.617 V
DEVICE MODEL ICD: 442538
TOT-0008: 0
TOT-0009: 1
TZAT-0001SLOWVT: 1
TZAT-0004FASTVT: 8
TZAT-0004SLOWVT: 8
TZAT-0012FASTVT: 200 ms
TZAT-0012SLOWVT: 200 ms
TZAT-0013FASTVT: 1
TZAT-0019FASTVT: 7.5 V
TZAT-0020FASTVT: 1 ms
TZON-0004FASTVT: 12
TZON-0004SLOWVT: 12
TZON-0005FASTVT: 6
TZON-0010SLOWVT: 80 ms
TZST-0001FASTVT: 3
TZST-0001FASTVT: 5
TZST-0001SLOWVT: 3
TZST-0003FASTVT: 25 J
TZST-0003FASTVT: 36 J
TZST-0003FASTVT: 36 J
TZST-0003SLOWVT: 15 J
TZST-0003SLOWVT: 36 J
VENTRICULAR PACING ICD: 99 pct

## 2010-10-04 NOTE — Progress Notes (Signed)
icd remote check  

## 2010-10-17 ENCOUNTER — Encounter: Payer: Self-pay | Admitting: *Deleted

## 2010-11-01 DIAGNOSIS — C61 Malignant neoplasm of prostate: Secondary | ICD-10-CM

## 2010-11-01 HISTORY — DX: Malignant neoplasm of prostate: C61

## 2010-11-06 ENCOUNTER — Other Ambulatory Visit (HOSPITAL_COMMUNITY): Payer: Self-pay | Admitting: Urology

## 2010-11-06 DIAGNOSIS — C61 Malignant neoplasm of prostate: Secondary | ICD-10-CM

## 2010-11-16 ENCOUNTER — Ambulatory Visit (HOSPITAL_COMMUNITY)
Admission: RE | Admit: 2010-11-16 | Discharge: 2010-11-16 | Disposition: A | Discharge status: Home / Self Care | Payer: PRIVATE HEALTH INSURANCE | Source: Ambulatory Visit | Attending: Urology | Admitting: Urology

## 2010-11-16 ENCOUNTER — Encounter (HOSPITAL_COMMUNITY)
Admission: RE | Admit: 2010-11-16 | Discharge: 2010-11-16 | Disposition: A | Discharge status: Home / Self Care | Payer: PRIVATE HEALTH INSURANCE | Source: Ambulatory Visit | Attending: Urology | Admitting: Urology

## 2010-11-16 DIAGNOSIS — M79609 Pain in unspecified limb: Secondary | ICD-10-CM | POA: Insufficient documentation

## 2010-11-16 DIAGNOSIS — C61 Malignant neoplasm of prostate: Secondary | ICD-10-CM | POA: Insufficient documentation

## 2010-11-16 MED ORDER — TECHNETIUM TC 99M MEDRONATE IV KIT
25.0000 | PACK | Freq: Once | INTRAVENOUS | Status: AC | PRN
  Administered 2010-11-16: 25 via INTRAVENOUS

## 2010-11-24 ENCOUNTER — Telehealth: Payer: Self-pay | Admitting: *Deleted

## 2010-11-24 DIAGNOSIS — T82198A Other mechanical complication of other cardiac electronic device, initial encounter: Secondary | ICD-10-CM

## 2010-11-24 NOTE — Telephone Encounter (Signed)
Checking lead 

## 2010-11-27 IMAGING — CR DG ABDOMEN ACUTE W/ 1V CHEST
3 series · 3 of 3 positions shown · non-contrast
Comparison: CT abdomen pelvis without contrast 02/27/2009.  One-
view abdomen x-ray 03/08/2008. Portable chest x-ray 07/21/2008.

CLINICAL DATA: Epigastric abdominal pain.  Nausea and vomiting.

ACUTE ABDOMEN SERIES (ABDOMEN 2 VIEW & CHEST 1 VIEW) 04/28/2009:

[w chest pa]
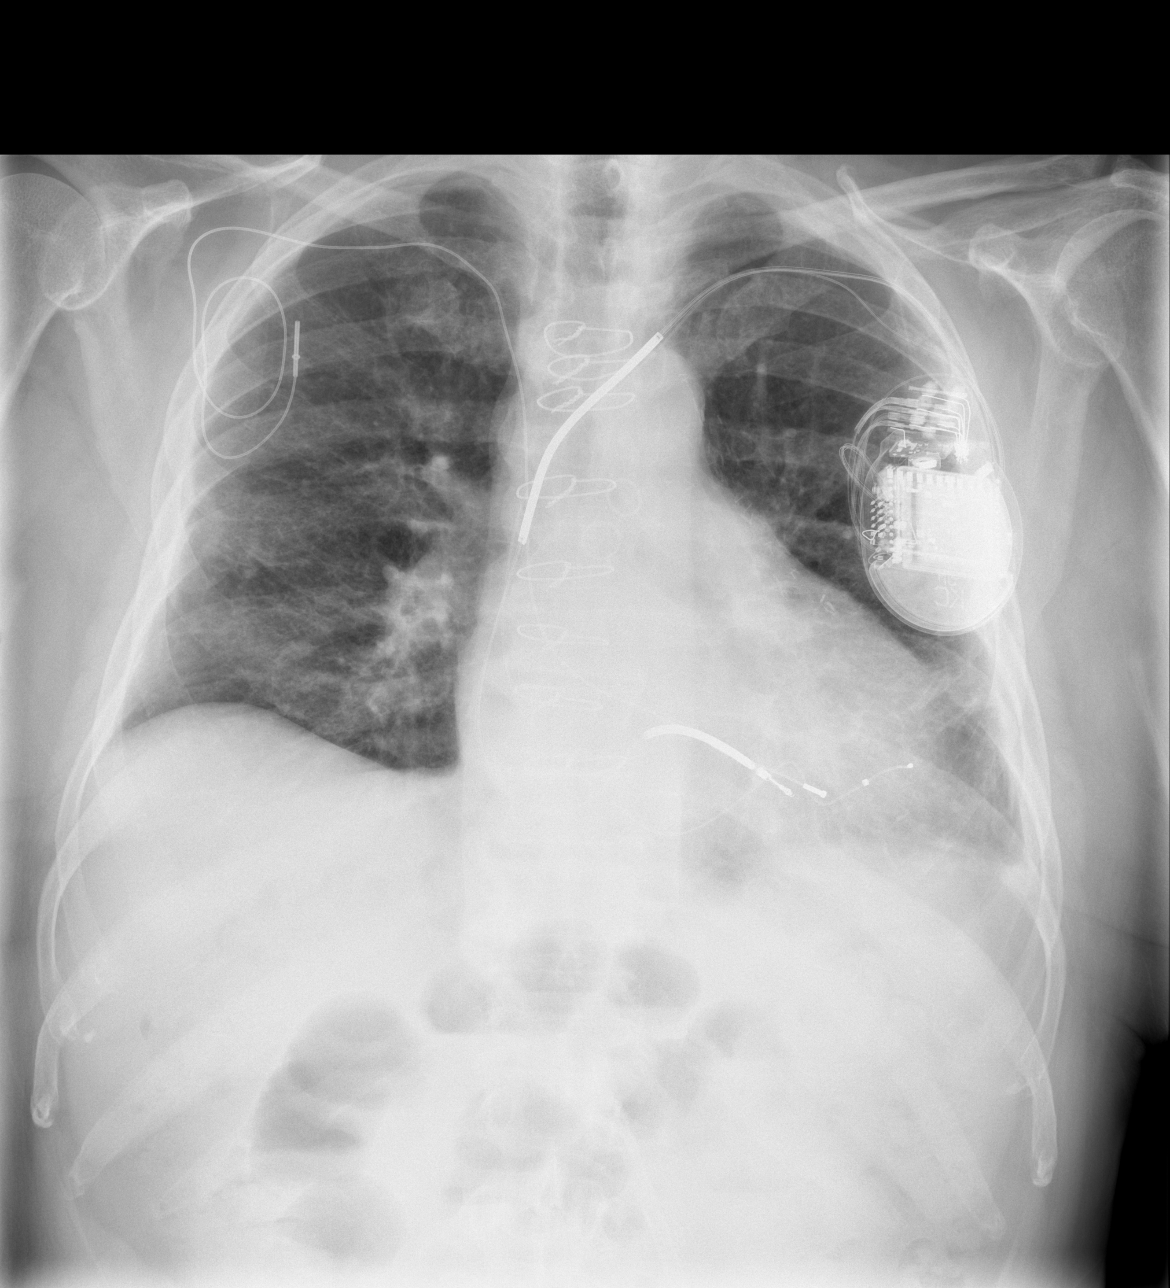

[w abdomen upright]
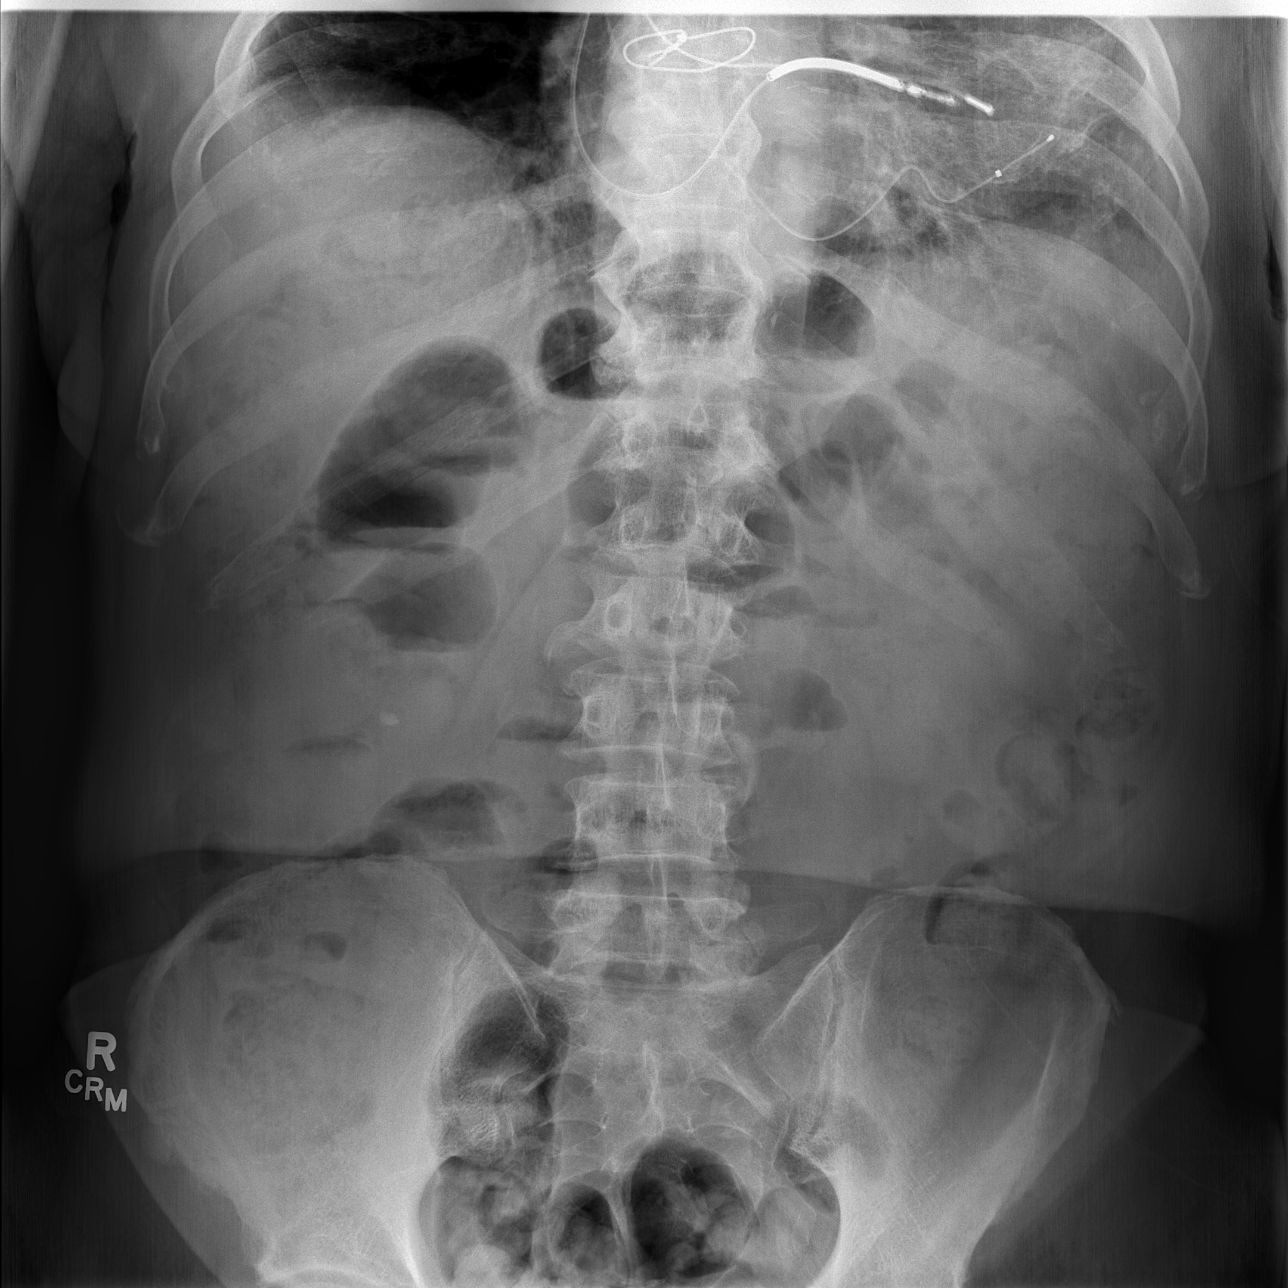

[t abdomen supine]
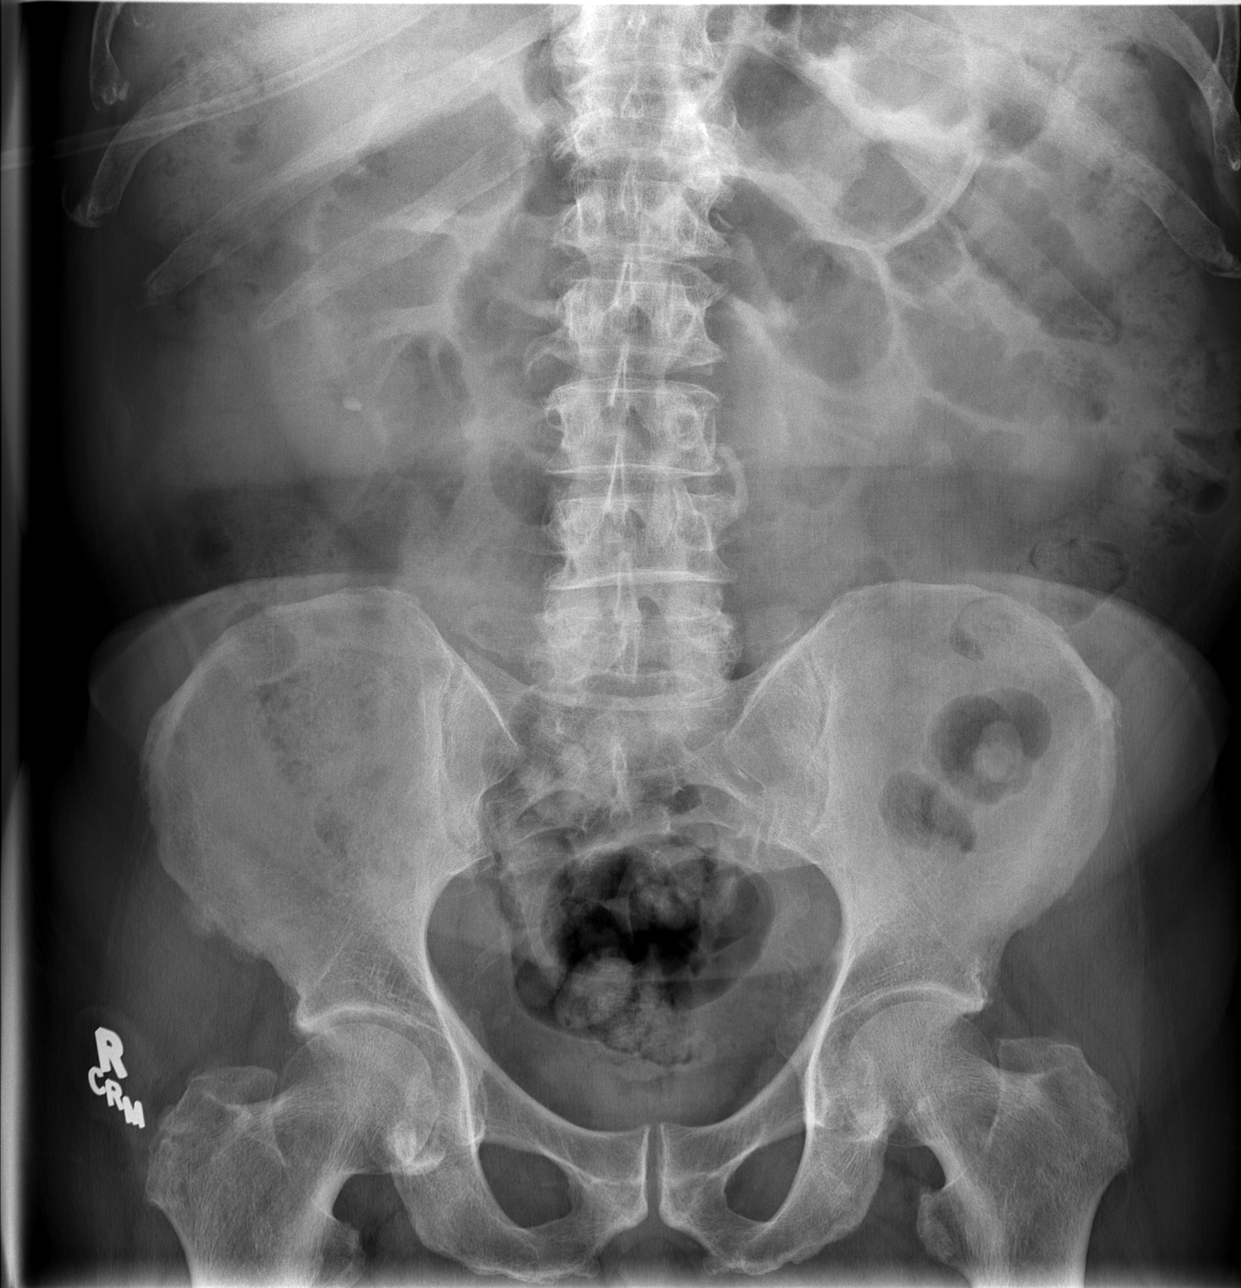

[3 of 3 positions shown; findings below may reference images not displayed]

FINDINGS: Gaseous distention of several loops of small bowel in the
upper abdomen, demonstrating air fluid levels on the erect image.
Distal small bowel and colon decompressed.  Moderate stool
throughout the colon from cecum to rectum.  Scattered air-fluid
levels in the colon consistent with liquid stool.  Calcification
projected over the lower pole of the right kidney consistent with
the calculus identified on the prior CT.  No opaque urinary tract
calculi elsewhere.  Degenerative changes throughout the lumbar
spine.

Prior sternotomy for CABG.  Heart moderately enlarged.  Left
subclavian AICD / biventricular pacemaker.  Old right subclavian
pacemaker lead wire.  Scarring in the mid and lower lungs
bilaterally, unchanged.  No new pulmonary parenchymal
abnormalities.
IMPRESSION: 1.  Partial small bowel obstruction.  No free intraperitoneal air.
2.  Stable right lower pole renal calculus since the prior CT from
February 2009.  Stable cardiomegaly and scarring throughout both lungs.  No
acute cardiopulmonary disease.

## 2010-11-27 IMAGING — CT CT ABD-PELV W/O CM
2 of 4 series · 17 of 46 positions shown, 19 images · non-contrast
Comparison: Acute abdominal series 04/28/2009, CT abdomen
02/27/2009.

CLINICAL DATA: Stomach pain.  Back pain.

CT ABDOMEN AND PELVIS WITHOUT CONTRAST
TECHNIQUE: Multidetector CT imaging of the abdomen and pelvis was
performed following the standard protocol without intravenous
contrast.

[Series 2: routine abdomen · axial · 0.92mm/px · z∈[-470,-56]mm · 14 of 91 slices shown, 16 images]
[im 4/91  soft-tissue]
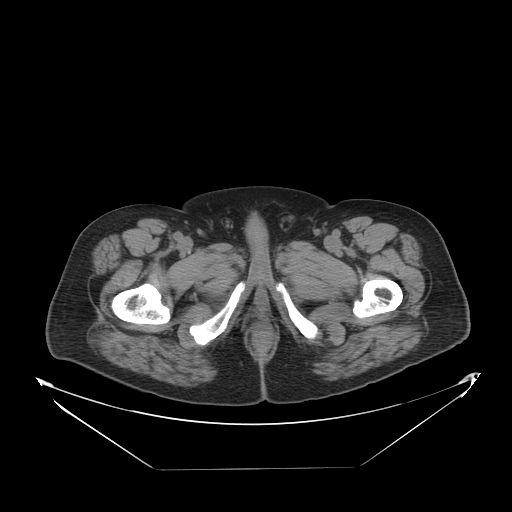
[im 4/91  bone]
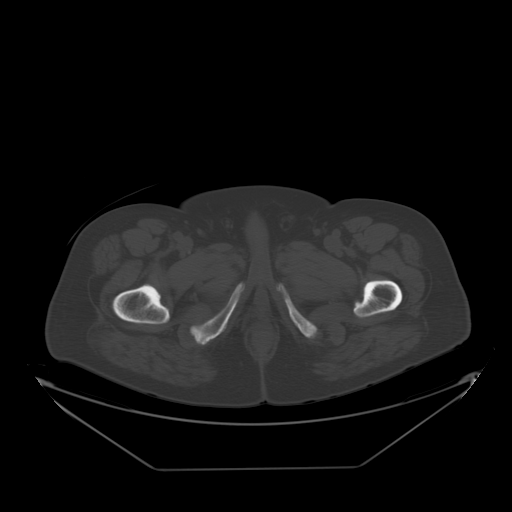
[im 12/91  soft-tissue]
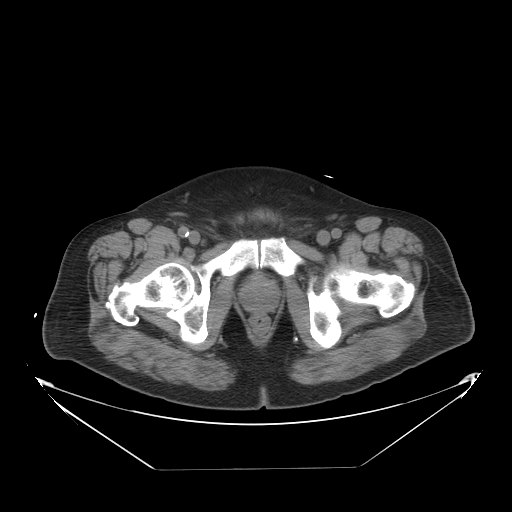
[im 19/91  soft-tissue]
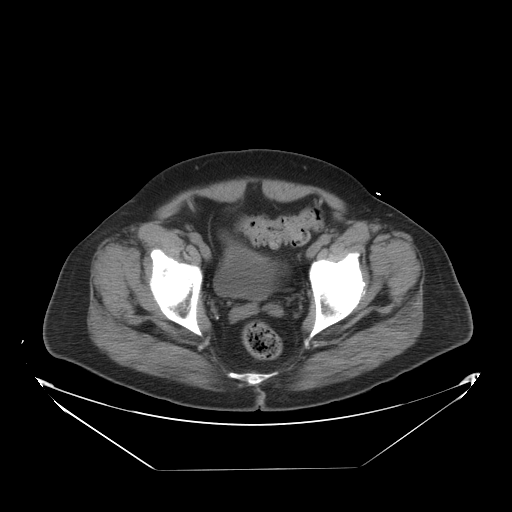
[im 23/91  soft-tissue]
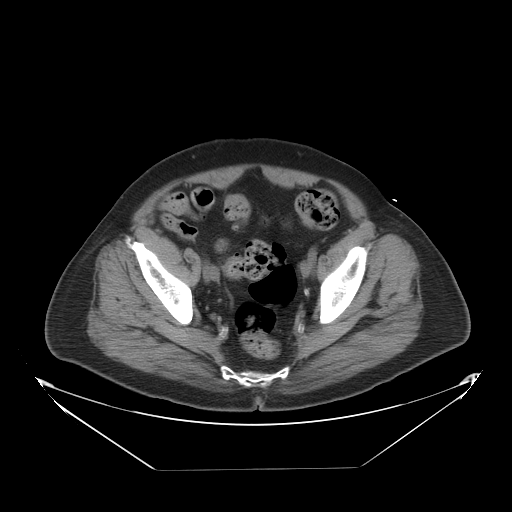
[im 31/91  soft-tissue]
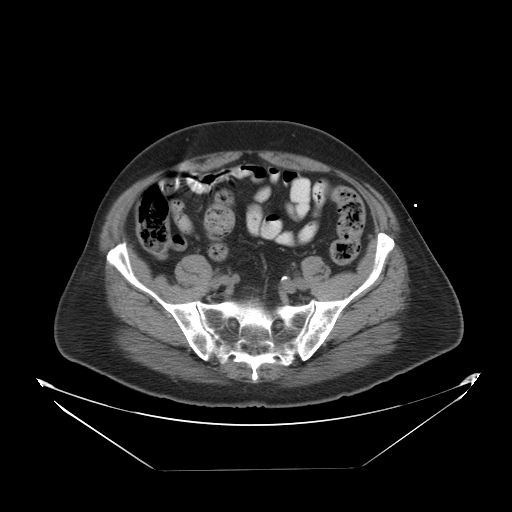
[im 38/91  soft-tissue]
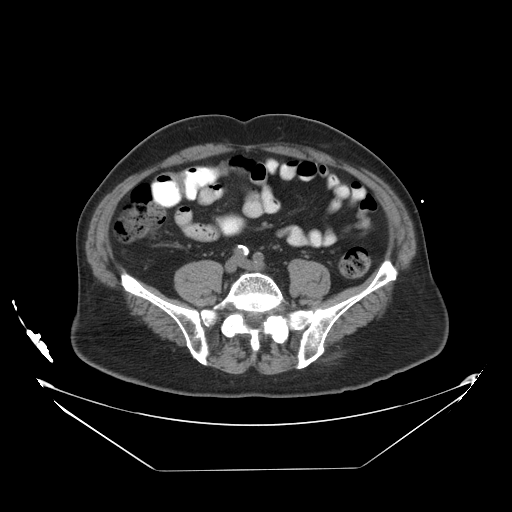
[im 42/91  soft-tissue]
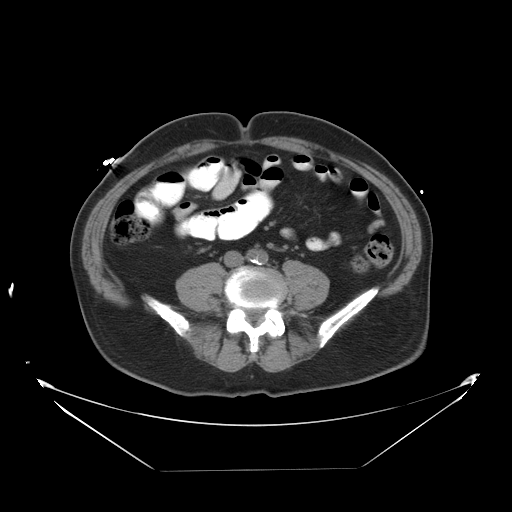
[im 49/91  soft-tissue]
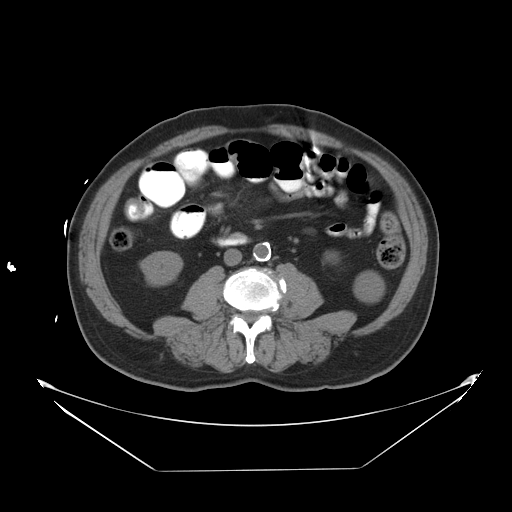
[im 53/91  soft-tissue]
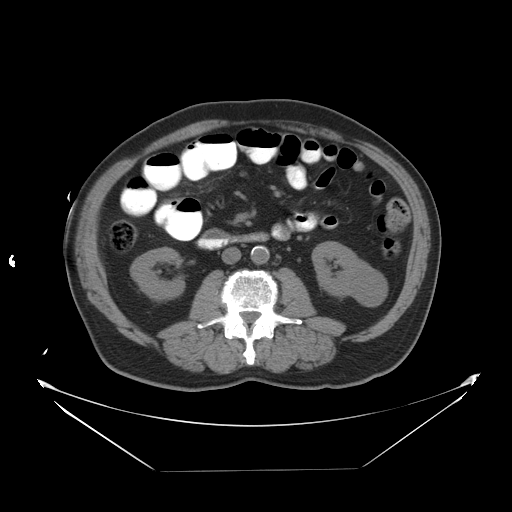
[im 53/91  bone]
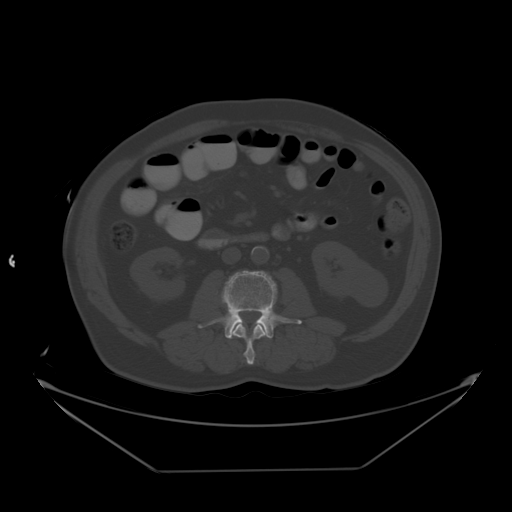
[im 61/91  soft-tissue]
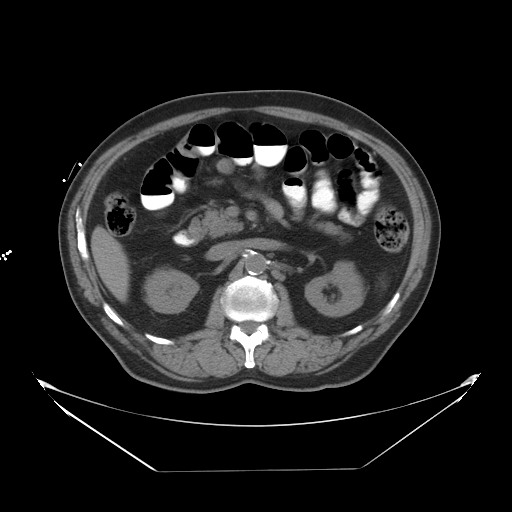
[im 68/91  soft-tissue]
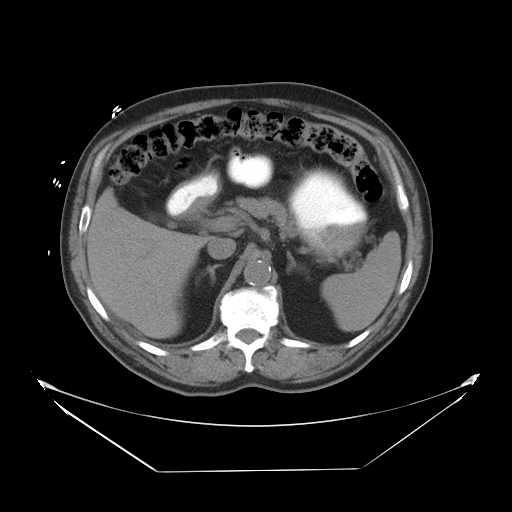
[im 72/91  soft-tissue]
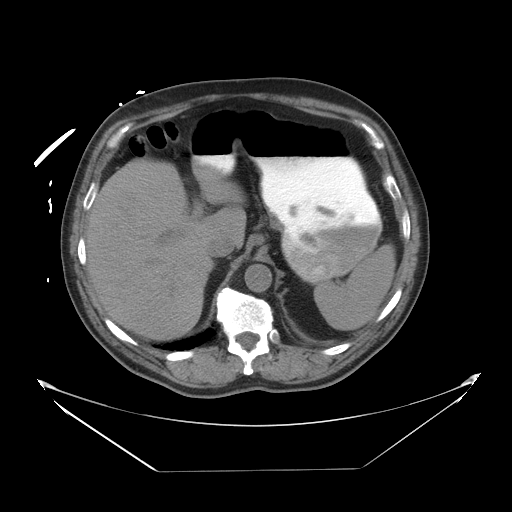
[im 79/91  soft-tissue]
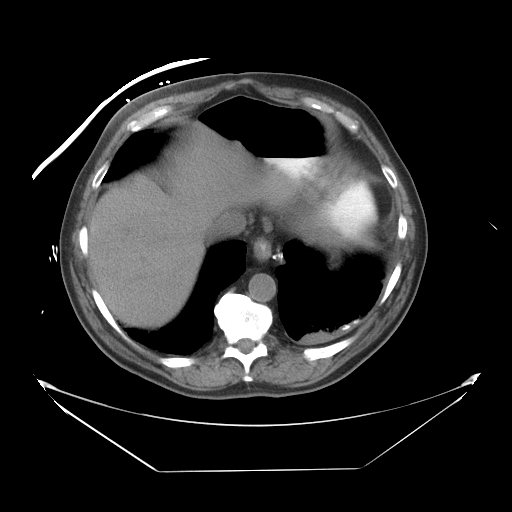
[im 87/91  soft-tissue]
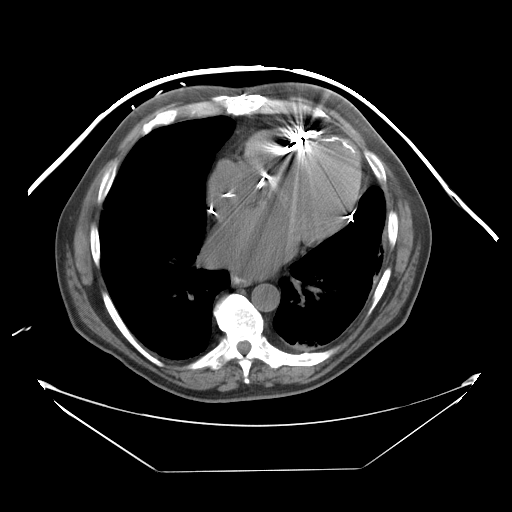

[Series 401: cor · coronal · 0.90mm/px · 3 of 102 slices shown]
[im 34/102  soft-tissue]
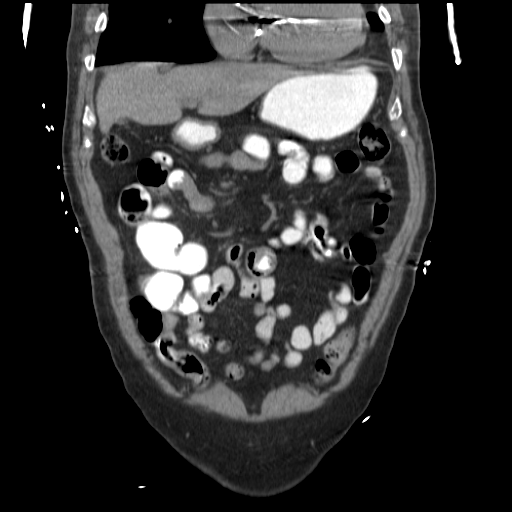
[im 45/102  soft-tissue]
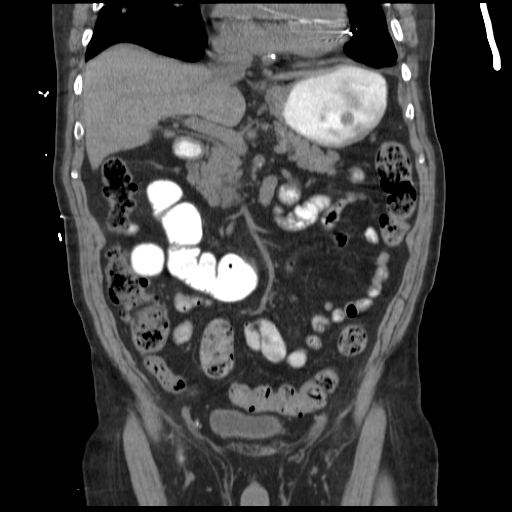
[im 57/102  soft-tissue]
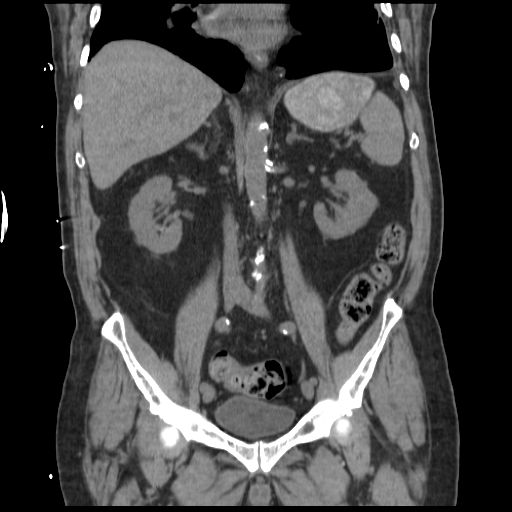

[17 of 46 positions shown; findings below may reference images not displayed]

FINDINGS: Dependent atelectasis at the lung bases.  Cardiomegaly
with pacemaker apparatus.  The left atrial enlargement.  Calcified
pleural plaque along the left lower lobe.  Mild distal esophageal
thickening, commonly associated with gastroesophageal reflux
disease. Unenhanced CT was performed per clinician order.  Lack of
IV contrast limits sensitivity and specificity, especially for
evaluation of abdominal/pelvic solid viscera. .  Liver and
gallbladder appear normal.  Gallbladder collapse.  Pancreas and
duodenum appear normal.

6 mm nonobstructing right inferior pole renal calculus.  Left
kidney demonstrates 3.9 cm inferior pole cyst, previously
characterized by ultrasound as simple.  Abdominal aortic and
visceral artery origin atherosclerosis.  No lymphadenopathy.  Small
bowel appears normal.  Normal appendix.  Colon unremarkable.
Urinary bladder normal. Unchanged L4 large Schmorl's node.
IMPRESSION: 1.  No acute abnormality.
2.  Cardiomegaly.
3.  Thickening of the distal esophagus; question gastroesophageal
reflux disease.
4.  Unchanged 6 mm right inferior pole renal calculus.  No ureteral
calculi.
5.  Atherosclerosis.
6.  Left inferior pole renal cystic lesion, previously
characterized by ultrasound as simple cyst.

## 2010-12-01 LAB — COMPREHENSIVE METABOLIC PANEL
Albumin: 3.8
BUN: 16
CO2: 28
Calcium: 9.1
Chloride: 104
Creatinine, Ser: 0.96
GFR calc non Af Amer: >60
Total Bilirubin: 1.6 — ABNORMAL HIGH

## 2010-12-01 LAB — CBC
HCT: 40.2
MCHC: 33.1
MCV: 95.9
Platelets: 146 — ABNORMAL LOW
WBC: 7.3

## 2010-12-01 LAB — PROTIME-INR: Prothrombin Time: 29.1 — ABNORMAL HIGH

## 2010-12-01 LAB — DIFFERENTIAL
Basophils Absolute: 0
Lymphocytes Relative: 18
Lymphs Abs: 1.3
Neutro Abs: 5.3

## 2010-12-01 LAB — POCT CARDIAC MARKERS
CKMB, poc: 2.9
Myoglobin, poc: 101
Operator id: 151321

## 2010-12-05 ENCOUNTER — Telehealth: Payer: Self-pay | Admitting: Internal Medicine

## 2010-12-05 NOTE — Telephone Encounter (Signed)
Krystal nurse with [facility] reporting pt 5.2lb wt loss over night. From 9/24-9/25. Please return call to discuss further.

## 2010-12-05 NOTE — Telephone Encounter (Signed)
Called and left message for Grover Canavan that he has an up coming appointment

## 2010-12-05 NOTE — Telephone Encounter (Signed)
Called patient at home and spoke with family member  He was walking out the door to gi to the Neurologist  She says there is no way he has gained 5 lbs and he is feeling fine  He will keep his follow up appointment on 12/21/2010

## 2010-12-05 NOTE — Telephone Encounter (Signed)
If he needs to be seen prior to 12/21/10  She or the patient may call and schedule an appointment with The  [facility] for end of this week or next

## 2010-12-06 LAB — POCT CARDIAC MARKERS
Operator id: 151321
Troponin i, poc: <0.05

## 2010-12-06 LAB — COMPREHENSIVE METABOLIC PANEL
AST: 37
Albumin: 3.9
Calcium: 9.3
Creatinine, Ser: 1.17
GFR calc Af Amer: >60
GFR calc non Af Amer: >60
Total Protein: 7.2

## 2010-12-06 LAB — URINALYSIS, ROUTINE W REFLEX MICROSCOPIC
Ketones, ur: 15 — AB
Nitrite: NEGATIVE
Protein, ur: NEGATIVE
pH: 5

## 2010-12-06 LAB — DIFFERENTIAL
Eosinophils Relative: 2
Lymphocytes Relative: 29
Lymphs Abs: 1.6
Monocytes Absolute: 0.7
Monocytes Relative: 13 — ABNORMAL HIGH

## 2010-12-06 LAB — URINE MICROSCOPIC-ADD ON

## 2010-12-06 LAB — CBC
MCHC: 34
MCV: 96.2
Platelets: 163
RDW: 15.3

## 2010-12-13 ENCOUNTER — Encounter (HOSPITAL_COMMUNITY): Payer: Self-pay | Admitting: Radiology

## 2010-12-13 ENCOUNTER — Inpatient Hospital Stay (HOSPITAL_COMMUNITY)
Admission: EM | Admit: 2010-12-13 | Discharge: 2010-12-15 | DRG: 312 | Disposition: A | Discharge status: Home / Self Care | Payer: PRIVATE HEALTH INSURANCE | Attending: Internal Medicine | Admitting: Internal Medicine

## 2010-12-13 ENCOUNTER — Emergency Department (HOSPITAL_COMMUNITY): Payer: PRIVATE HEALTH INSURANCE

## 2010-12-13 DIAGNOSIS — Z95810 Presence of automatic (implantable) cardiac defibrillator: Secondary | ICD-10-CM

## 2010-12-13 DIAGNOSIS — I6529 Occlusion and stenosis of unspecified carotid artery: Secondary | ICD-10-CM | POA: Diagnosis present

## 2010-12-13 DIAGNOSIS — Z9981 Dependence on supplemental oxygen: Secondary | ICD-10-CM

## 2010-12-13 DIAGNOSIS — R55 Syncope and collapse: Principal | ICD-10-CM | POA: Diagnosis present

## 2010-12-13 DIAGNOSIS — N189 Chronic kidney disease, unspecified: Secondary | ICD-10-CM | POA: Diagnosis present

## 2010-12-13 DIAGNOSIS — Z7901 Long term (current) use of anticoagulants: Secondary | ICD-10-CM

## 2010-12-13 DIAGNOSIS — J449 Chronic obstructive pulmonary disease, unspecified: Secondary | ICD-10-CM | POA: Diagnosis present

## 2010-12-13 DIAGNOSIS — J8410 Pulmonary fibrosis, unspecified: Secondary | ICD-10-CM | POA: Diagnosis present

## 2010-12-13 DIAGNOSIS — N200 Calculus of kidney: Secondary | ICD-10-CM | POA: Diagnosis present

## 2010-12-13 DIAGNOSIS — J4489 Other specified chronic obstructive pulmonary disease: Secondary | ICD-10-CM | POA: Diagnosis present

## 2010-12-13 DIAGNOSIS — E119 Type 2 diabetes mellitus without complications: Secondary | ICD-10-CM | POA: Diagnosis present

## 2010-12-13 DIAGNOSIS — I129 Hypertensive chronic kidney disease with stage 1 through stage 4 chronic kidney disease, or unspecified chronic kidney disease: Secondary | ICD-10-CM | POA: Diagnosis present

## 2010-12-13 DIAGNOSIS — I2589 Other forms of chronic ischemic heart disease: Secondary | ICD-10-CM | POA: Diagnosis present

## 2010-12-13 DIAGNOSIS — N179 Acute kidney failure, unspecified: Secondary | ICD-10-CM | POA: Diagnosis present

## 2010-12-13 DIAGNOSIS — Z8546 Personal history of malignant neoplasm of prostate: Secondary | ICD-10-CM

## 2010-12-13 HISTORY — DX: Cerebral infarction, unspecified: I63.9

## 2010-12-13 HISTORY — DX: Malignant neoplasm of unspecified part of unspecified bronchus or lung: C34.90

## 2010-12-13 HISTORY — DX: Essential (primary) hypertension: I10

## 2010-12-13 LAB — CBC
HCT: 30.2 % — ABNORMAL LOW (ref 39.0–52.0)
Hemoglobin: 10.5 g/dL — ABNORMAL LOW (ref 13.0–17.0)
MCV: 91.8 fL (ref 78.0–100.0)
RBC: 3.29 MIL/uL — ABNORMAL LOW (ref 4.22–5.81)
WBC: 5.8 10*3/uL (ref 4.0–10.5)

## 2010-12-13 LAB — COMPREHENSIVE METABOLIC PANEL
AST: 26 U/L (ref 0–37)
Albumin: 3.9 g/dL (ref 3.5–5.2)
Alkaline Phosphatase: 88 U/L (ref 39–117)
Chloride: 94 mEq/L — ABNORMAL LOW (ref 96–112)
Potassium: 4.3 mEq/L (ref 3.5–5.1)
Total Bilirubin: 0.6 mg/dL (ref 0.3–1.2)

## 2010-12-13 LAB — DIFFERENTIAL
Lymphocytes Relative: 30 % (ref 12–46)
Lymphs Abs: 1.7 10*3/uL (ref 0.7–4.0)
Neutrophils Relative %: 59 % (ref 43–77)

## 2010-12-13 LAB — PROTIME-INR
INR: 2.09 — ABNORMAL HIGH (ref 0.00–1.49)
Prothrombin Time: 23.8 seconds — ABNORMAL HIGH (ref 11.6–15.2)

## 2010-12-14 ENCOUNTER — Ambulatory Visit: Payer: PRIVATE HEALTH INSURANCE | Admitting: Radiation Oncology

## 2010-12-14 ENCOUNTER — Observation Stay (HOSPITAL_COMMUNITY): Payer: PRIVATE HEALTH INSURANCE

## 2010-12-14 DIAGNOSIS — I6529 Occlusion and stenosis of unspecified carotid artery: Secondary | ICD-10-CM

## 2010-12-14 DIAGNOSIS — R55 Syncope and collapse: Secondary | ICD-10-CM

## 2010-12-14 LAB — CBC
Hemoglobin: 10.5 g/dL — ABNORMAL LOW (ref 13.0–17.0)
MCH: 31.9 pg (ref 26.0–34.0)
MCHC: 35.2 g/dL (ref 30.0–36.0)
Platelets: 122 10*3/uL — ABNORMAL LOW (ref 150–400)

## 2010-12-14 LAB — CARDIAC PANEL(CRET KIN+CKTOT+MB+TROPI)
CK, MB: 2.5 ng/mL (ref 0.3–4.0)
CK, MB: 2.5 ng/mL (ref 0.3–4.0)
Relative Index: INVALID (ref 0.0–2.5)
Total CK: 82 U/L (ref 7–232)
Total CK: 85 U/L (ref 7–232)
Troponin I: <0.3 ng/mL (ref <0.30)

## 2010-12-14 LAB — BASIC METABOLIC PANEL
BUN: 31 mg/dL — ABNORMAL HIGH (ref 6–23)
CO2: 29 mEq/L (ref 19–32)
Calcium: 9.9 mg/dL (ref 8.4–10.5)
Creatinine, Ser: 1.86 mg/dL — ABNORMAL HIGH (ref 0.50–1.35)
Glucose, Bld: 90 mg/dL (ref 70–99)

## 2010-12-14 LAB — LIPID PANEL: LDL Cholesterol: 41 mg/dL (ref 0–99)

## 2010-12-14 LAB — URINE MICROSCOPIC-ADD ON

## 2010-12-14 LAB — MAGNESIUM: Magnesium: 1.7 mg/dL (ref 1.5–2.5)

## 2010-12-14 LAB — COMPREHENSIVE METABOLIC PANEL
Albumin: 3.8 g/dL (ref 3.5–5.2)
BUN: 32 mg/dL — ABNORMAL HIGH (ref 6–23)
Chloride: 99 mEq/L (ref 96–112)
Creatinine, Ser: 1.89 mg/dL — ABNORMAL HIGH (ref 0.50–1.35)
GFR calc Af Amer: 38 mL/min — ABNORMAL LOW (ref >90)
Total Bilirubin: 0.8 mg/dL (ref 0.3–1.2)

## 2010-12-14 LAB — TSH: TSH: 2.707 u[IU]/mL (ref 0.350–4.500)

## 2010-12-14 LAB — URINALYSIS, ROUTINE W REFLEX MICROSCOPIC
Nitrite: NEGATIVE
Specific Gravity, Urine: 1.007 (ref 1.005–1.030)
pH: 5.5 (ref 5.0–8.0)

## 2010-12-14 LAB — DIGOXIN LEVEL: Digoxin Level: 0.8 ng/mL (ref 0.8–2.0)

## 2010-12-14 LAB — PROTIME-INR: Prothrombin Time: 23.7 seconds — ABNORMAL HIGH (ref 11.6–15.2)

## 2010-12-14 LAB — CK TOTAL AND CKMB (NOT AT ARMC)
CK, MB: 2.4 ng/mL (ref 0.3–4.0)
Relative Index: INVALID (ref 0.0–2.5)

## 2010-12-14 LAB — PHOSPHORUS: Phosphorus: 3.4 mg/dL (ref 2.3–4.6)

## 2010-12-15 LAB — COMPREHENSIVE METABOLIC PANEL
ALT: 63 U/L — ABNORMAL HIGH (ref 0–53)
AST: 63 U/L — ABNORMAL HIGH (ref 0–37)
Albumin: 3.3 g/dL — ABNORMAL LOW (ref 3.5–5.2)
BUN: 24 mg/dL — ABNORMAL HIGH (ref 6–23)
CO2: 27 mEq/L (ref 19–32)
Calcium: 9.7 mg/dL (ref 8.4–10.5)
Chloride: 103 mEq/L (ref 96–112)
Creatinine, Ser: 1.1 mg/dL (ref 0.4–1.5)
GFR calc Af Amer: 58 mL/min — ABNORMAL LOW (ref >60)
GFR calc non Af Amer: 48 mL/min — ABNORMAL LOW (ref >60)
Glucose, Bld: 79 mg/dL (ref 70–99)
Sodium: 137 mEq/L (ref 135–145)
Total Bilirubin: 2.9 mg/dL — ABNORMAL HIGH (ref 0.3–1.2)
Total Protein: 6.1 g/dL (ref 6.0–8.3)

## 2010-12-15 LAB — BASIC METABOLIC PANEL
BUN: 22 mg/dL (ref 6–23)
BUN: 14 mg/dL (ref 6–23)
CO2: 27 mEq/L (ref 19–32)
Calcium: 9.5 mg/dL (ref 8.4–10.5)
Calcium: 8.9 mg/dL (ref 8.4–10.5)
Chloride: 109 mEq/L (ref 96–112)
Creatinine, Ser: 1.29 mg/dL (ref 0.50–1.35)
Creatinine, Ser: 0.87 mg/dL (ref 0.4–1.5)
GFR calc Af Amer: 60 mL/min — ABNORMAL LOW (ref >90)
GFR calc Af Amer: >60 mL/min (ref >60)
GFR calc non Af Amer: 52 mL/min — ABNORMAL LOW (ref >90)

## 2010-12-15 LAB — CBC
HCT: 29.2 % — ABNORMAL LOW (ref 39.0–52.0)
HCT: 37.8 % — ABNORMAL LOW (ref 39.0–52.0)
Hemoglobin: 10.1 g/dL — ABNORMAL LOW (ref 13.0–17.0)
Hemoglobin: 12.6 g/dL — ABNORMAL LOW (ref 13.0–17.0)
MCHC: 34.6 g/dL (ref 30.0–36.0)
MCHC: 32.8 g/dL (ref 30.0–36.0)
MCHC: 34.3 g/dL (ref 30.0–36.0)
MCV: 91.5 fL (ref 78.0–100.0)
MCV: 96.7 fL (ref 78.0–100.0)
MCV: 97.4 fL (ref 78.0–100.0)
Platelets: 168 10*3/uL (ref 150–400)
Platelets: ADEQUATE 10*3/uL (ref 150–400)
RBC: 3.72 MIL/uL — ABNORMAL LOW (ref 4.22–5.81)
RBC: 4.18 MIL/uL — ABNORMAL LOW (ref 4.22–5.81)
RDW: 15.2 % (ref 11.5–15.5)
RDW: 15.6 % — ABNORMAL HIGH (ref 11.5–15.5)
RDW: 15.6 % — ABNORMAL HIGH (ref 11.5–15.5)
WBC: 11.3 10*3/uL — ABNORMAL HIGH (ref 4.0–10.5)

## 2010-12-15 LAB — URINE MICROSCOPIC-ADD ON

## 2010-12-15 LAB — URINALYSIS, ROUTINE W REFLEX MICROSCOPIC
Glucose, UA: NEGATIVE mg/dL
Hgb urine dipstick: NEGATIVE
Specific Gravity, Urine: 1.018 (ref 1.005–1.030)
pH: 5.5 (ref 5.0–8.0)

## 2010-12-15 LAB — URINE CULTURE: Colony Count: 4000

## 2010-12-15 LAB — DIFFERENTIAL
Eosinophils Absolute: 0 10*3/uL (ref 0.0–0.7)
Eosinophils Relative: 0 % (ref 0–5)
Lymphs Abs: 1.2 10*3/uL (ref 0.7–4.0)
Monocytes Absolute: 0.9 10*3/uL (ref 0.1–1.0)
Monocytes Relative: 8 % (ref 3–12)

## 2010-12-15 LAB — GLUCOSE, CAPILLARY
Glucose-Capillary: 91 mg/dL (ref 70–99)
Glucose-Capillary: 80 mg/dL (ref 70–99)
Glucose-Capillary: 82 mg/dL (ref 70–99)

## 2010-12-15 LAB — TSH: TSH: 1.212 u[IU]/mL (ref 0.350–4.500)

## 2010-12-15 LAB — PROTIME-INR
INR: 2 — ABNORMAL HIGH (ref 0.00–1.49)
Prothrombin Time: 23.4 seconds — ABNORMAL HIGH (ref 11.6–15.2)

## 2010-12-15 LAB — LIPASE, BLOOD: Lipase: 64 U/L — ABNORMAL HIGH (ref 11–59)

## 2010-12-17 ENCOUNTER — Telehealth: Payer: Self-pay | Admitting: Nurse Practitioner

## 2010-12-17 NOTE — Telephone Encounter (Signed)
Received page from answering service related to call from Donna Bernard from [place] who called answering service to report that Mr. Basara wt is up 5.1 lbs over past few days.  The page requested that I call pt at home.  I called Mr Colvin and spoke to him.  He initially said he was only up 1 lb...since yesterday but upon further questioning he did note that his wt was  roughly 5 lbs less the other day.  While hosp recently, his lasix dose was decreased to 40mg  daily.  He currently denies any sob, c/p, orthopnea, pnd, or edema.  I rec that he take an additional 40mg  of lasix today and to monitor his wt. In the am.  He is to call back for wt gain of 2 lbs/day or 5lbs over the course of a week.  He verbalized understanding.

## 2010-12-18 NOTE — Consult Note (Signed)
NAMERILYN, UPSHAW NO.:  [id number]  MEDICAL RECORD NO.:  [id number]  LOCATION:                                 FACILITY:  PHYSICIAN:  Fransisco Hertz, MD       DATE OF BIRTH:  1932-04-14  DATE OF CONSULTATION: DATE OF DISCHARGE:                                CONSULTATION   REQUESTING PHYSICIAN:  Hospitalist Service, Lonia Blood, MD.  REASON FOR CONSULTATION:  Syncope and left internal carotid artery occlusion.  HISTORY OF PRESENT ILLNESS:  This is a 77 year old patient with multiple cardiac comorbidities that presents with a syncopal event.  He was found by the family to be slumped over and got admitted to the hospital subsequently for syncope.  Apparently, he previously has had episodes of such and required a AICD placement.  He denies any recent history of stroke or TIA symptoms.  He claims that previously he has had a stroke twice, but he had no residual deficits.  He is not exactly aware of the symptoms he had at that point.  He denies any episodes of amaurosis fugax or monocular blindness.  He denies any facial drooping or hemiplegia.  Also, he denies any expressive or receptive aphasia.  His atherosclerotic risk factors include hypertension and diabetes.  PAST MEDICAL HISTORY: 1. Coronary artery disease. 2. Previous syncopal event. 3. Ischemic cardiomyopathy. 4. Diabetes. 5. Hypertension. 6. Nephrolithiasis. 7. Pulmonary asbestosis. 8. Prostate cancer. 9. COPD. 10.Lumbar history of alcohol abuse. 11.Chronic kidney disease. 12.Gastroesophageal reflux disease. 13.History of chronic atrial fibrillation. 14.Previous stroke. 15.History of partial small bowel obstruction.  PAST SURGICAL HISTORY: 1. CABG. 2. AICD placement. 3. This gentleman has also had a left rotator cuff procedure in the     past.  SOCIAL HISTORY:  Denies any tobacco or illicit drug use.  Continues to occasionally drink alcohol.  Denies any recent heavy alcohol  use.  FAMILY HISTORY:  Father died at 64 from MI.  Father also had colon cancer.  He is not aware of his mother's medical history.  He also had three brothers and a sister with diabetes.  MEDICATIONS:  Allopurinol, atorvastatin, Coumadin, Lasix, isosorbide mononitrate, Lanoxin, lisinopril, metoprolol, Prilosec, and potassium chloride.  ALLERGIES:  ASPIRIN, which gave the patient hives.  REVIEW OF SYSTEMS:  As listed above, otherwise noted to be negative.  PHYSICAL EXAMINATION:  VITAL SIGNS:  Temperature 97.5, blood pressure 115/72, heart rate of 62, respirations 18, and saturating 100% on room air. GENERAL:  He appears his stated age, well developed, well nourished, no apparent distress. HEAD:  Normocephalic and atraumatic. ENT:  Oropharynx without any teeth, without any obvious erythema or exudate.  Nares without drainage.  Hearing is grossly intact. NECK:  Trachea is midline.  There is no nuchal rigidity.  There is no obvious JVD on examination. EYE:  Pupils were equal, round, and reactive to light.  Extraocular movements were intact. PULMONARY:  Symmetric expansion.  Good air movement.  No rales, rhonchi, or wheezing. CARDIAC:  Irregularly irregular rate and rhythm.  No murmurs, rubs, thrills, or gallops.  However, I could not auscultate lower in the chest, abdominal sounds. VASCULAR:  He had easily  palpable radial and brachial pulses on the right side.  I could feel a carotid pulse without any bruit.  On the left side, there is no obvious pulse.  I was not able to feel a large midline abdominal pulse or is consistent with the aorta.  Bilateral femorals are easily palpable.  He has no palpable popliteal, but easily palpable pedals bilaterally. ABDOMEN:  Soft abdomen, nontender, nondistended.  No guarding, no rebound, no splenomegaly, no obvious masses. MUSCULOSKELETAL:  Motor was 5/5 throughout.  He has no signs of ischemic changes in any extremity.  Some ecchymoses in  extremities consistent with his Coumadin use. NEURO:  Cranial nerves II-XII were intact.  The motor exam as listed above.  Sensation is grossly intact in all extremities. PSYCH"  judgment is intact.  Mood and affect were appropriate for his clinical situation. SKIN:  There are ecchymoses in extremities as noted, otherwise I did not note any rashes elsewhere. LYMPHATIC:  No cervical, axillary, or inguinal lymphadenopathy was noted.  LABORATORY STUDIES:  He had a cardiac panel with a CK of 85, CK-MB 2.5, troponins were less than 0.3.  Digoxin level was 0.8.  Chemistry: Sodium 134, potassium 4.0, chloride 98, bicarb 28, glucose 90, BUN is 31, creatinine is 1.86, calcium is 9.9.  TSH was 2.707.  CBC:  This gentleman with a white count of 5.3, H and H of 10.5 and 29.8, and platelet count 122.  PT was 23.7, INR 2.07.  He had a urinalysis which demonstrated only a small amount of leukocyte esterase.  On the microscopy, there was rare squamous, white cells were 3-6, and bacteria was rare.  NONINVASIVE STUDIES:  The patient had a bilateral carotid duplex completed and we will be finalizing the official reads on this, the left internal carotid artery is occluded.  The right has no hemodynamically significant stenosis.  Bilateral vertebral arteries are antegrade and patent.  He also had a transcranial Doppler that was pending.  Those results are not available yet.  MEDICAL DECISION MAKING:  This is a 77 year old gentleman with multiple coronary comorbidities that presents with syncope.  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  The  syncope related to carotid disease requires global hypoperfusion which the carotid duplexes are not consistent with such.  It is possible to have some degree of vertebrobasilar symptomatology, but based on the waveforms that are evident, there is nothing to suggest a distal vertebral artery occlusion as would be seen with a  basilar artery occlusion.  Subsequently, I suspect that his syncope has nothing to do with this left internal carotid artery occlusion.  The patient should undergo a routine surveillance either with cardiologist or with [name] as an outpatient, but immediately I do not think anything needs to be done as from a vascular surgery viewpoint and the literature does not support revascularization of internal carotid artery occlusions as there is a high percentage of conversion to hemorrhagic stroke.  Subsequently, once again, from a carotid viewpoint my only recommendation is to continue with maximal medical management including statin use, antiplatelet, and management of multiple comorbidities.     Fransisco Hertz, MD     BLC/MEDQ  D:  12/14/2010  T:  12/15/2010  Job:  [id number]  Electronically Signed by Leonides Sake MD on 12/18/2010 09:19:26 AM

## 2010-12-19 LAB — COMPREHENSIVE METABOLIC PANEL
ALT: 105 — ABNORMAL HIGH
AST: 50 — ABNORMAL HIGH
AST: 51 — ABNORMAL HIGH
Albumin: 4.1
Alkaline Phosphatase: 107
BUN: 13
CO2: 31
Chloride: 102
Chloride: 99
Creatinine, Ser: 1.1
GFR calc Af Amer: >60
GFR calc non Af Amer: >60
Glucose, Bld: 96
Potassium: 4.4
Sodium: 137
Total Bilirubin: 1.9 — ABNORMAL HIGH
Total Bilirubin: 2.2 — ABNORMAL HIGH
Total Protein: 7.7

## 2010-12-19 LAB — CBC
HCT: 39.1
HCT: 45.9
Hemoglobin: 14.3
MCHC: 32.6
MCHC: 33.1
MCV: 95.3
Platelets: 184
Platelets: 193
Platelets: 210
RBC: 4.28
RBC: 4.58
RDW: 17.8 — ABNORMAL HIGH
WBC: 7.2
WBC: 7.7
WBC: 8.1

## 2010-12-19 LAB — DIFFERENTIAL
Basophils Absolute: 0
Eosinophils Relative: 1
Lymphocytes Relative: 19
Neutrophils Relative %: 71

## 2010-12-19 LAB — MAGNESIUM: Magnesium: 2.2

## 2010-12-19 LAB — CK TOTAL AND CKMB (NOT AT ARMC)
Relative Index: INVALID
Total CK: 42
Total CK: 47

## 2010-12-19 LAB — BASIC METABOLIC PANEL
BUN: 28 — ABNORMAL HIGH
CO2: 30
Calcium: 8.7
Calcium: 9.3
Chloride: 100
Chloride: 102
GFR calc Af Amer: >60
GFR calc Af Amer: >60
GFR calc non Af Amer: 60 — ABNORMAL LOW
Glucose, Bld: 111 — ABNORMAL HIGH
Potassium: 4
Sodium: 137
Sodium: 137
Sodium: 138

## 2010-12-19 LAB — HEPARIN LEVEL (UNFRACTIONATED)
Heparin Unfractionated: 0.27 — ABNORMAL LOW
Heparin Unfractionated: 0.53
Heparin Unfractionated: 0.75 — ABNORMAL HIGH
Heparin Unfractionated: 0.79 — ABNORMAL HIGH
Heparin Unfractionated: 0.82 — ABNORMAL HIGH
Heparin Unfractionated: 1.65 — ABNORMAL HIGH

## 2010-12-19 LAB — DIGOXIN LEVEL: Digoxin Level: 0.5 — ABNORMAL LOW

## 2010-12-19 LAB — LIPID PANEL
Cholesterol: 115
HDL: 22 — ABNORMAL LOW
LDL Cholesterol: 72
Total CHOL/HDL Ratio: 5.2

## 2010-12-19 LAB — POCT I-STAT CREATININE
Creatinine, Ser: 1
Operator id: 272551

## 2010-12-19 LAB — B-NATRIURETIC PEPTIDE (CONVERTED LAB)
Pro B Natriuretic peptide (BNP): 249 — ABNORMAL HIGH
Pro B Natriuretic peptide (BNP): 564 — ABNORMAL HIGH
Pro B Natriuretic peptide (BNP): 621 — ABNORMAL HIGH

## 2010-12-19 LAB — I-STAT 8, (EC8 V) (CONVERTED LAB)
Acid-Base Excess: 3 — ABNORMAL HIGH
Chloride: 105
HCT: 41
Hemoglobin: 13.9
Operator id: 272551
Potassium: 3.8
Sodium: 140
TCO2: 27
pH, Ven: 7.494 — ABNORMAL HIGH

## 2010-12-19 LAB — APTT
aPTT: 101 — ABNORMAL HIGH
aPTT: 32

## 2010-12-19 LAB — HEMOGLOBIN A1C
Hgb A1c MFr Bld: 6.1
Mean Plasma Glucose: 140

## 2010-12-19 LAB — PROTIME-INR: Prothrombin Time: 19.2 — ABNORMAL HIGH

## 2010-12-21 ENCOUNTER — Ambulatory Visit (INDEPENDENT_AMBULATORY_CARE_PROVIDER_SITE_OTHER): Payer: PRIVATE HEALTH INSURANCE | Admitting: Internal Medicine

## 2010-12-21 ENCOUNTER — Encounter: Payer: Self-pay | Admitting: Internal Medicine

## 2010-12-21 DIAGNOSIS — Z95810 Presence of automatic (implantable) cardiac defibrillator: Secondary | ICD-10-CM

## 2010-12-21 DIAGNOSIS — I2589 Other forms of chronic ischemic heart disease: Secondary | ICD-10-CM

## 2010-12-21 DIAGNOSIS — I509 Heart failure, unspecified: Secondary | ICD-10-CM

## 2010-12-21 DIAGNOSIS — I4891 Unspecified atrial fibrillation: Secondary | ICD-10-CM

## 2010-12-21 LAB — URINALYSIS, ROUTINE W REFLEX MICROSCOPIC
Bilirubin Urine: NEGATIVE
Nitrite: NEGATIVE
Specific Gravity, Urine: 1.013
Urobilinogen, UA: 1
pH: 5.5

## 2010-12-21 LAB — ICD DEVICE OBSERVATION
ATRIAL PACING ICD: 0 pct
BATTERY VOLTAGE: 2.6019 V
DEVICE MODEL ICD: 442538
FVT: 0
HV IMPEDENCE: 40 Ohm
LV LEAD THRESHOLD: 0.75 V
MODE SWITCH EPISODES: 0
PACEART VT: 0
RV LEAD AMPLITUDE: 11.6 mv
TOT-0007: 2
TOT-0010: 27
TZAT-0001SLOWVT: 1
TZAT-0012FASTVT: 200 ms
TZAT-0012SLOWVT: 200 ms
TZAT-0013FASTVT: 1
TZAT-0013SLOWVT: 3
TZAT-0018FASTVT: NEGATIVE
TZAT-0020SLOWVT: 1 ms
TZON-0003FASTVT: 300 ms
TZON-0005SLOWVT: 6
TZST-0001FASTVT: 3
TZST-0001FASTVT: 5
TZST-0001SLOWVT: 4
TZST-0001SLOWVT: 5
TZST-0003FASTVT: 36 J
TZST-0003FASTVT: 36 J
TZST-0003FASTVT: 36 J
TZST-0003SLOWVT: 15 J

## 2010-12-21 LAB — PROTIME-INR
INR: 1.8 — ABNORMAL HIGH
INR: 2.1 — ABNORMAL HIGH
Prothrombin Time: 20.7 — ABNORMAL HIGH
Prothrombin Time: 20.7 — ABNORMAL HIGH
Prothrombin Time: 21.6 — ABNORMAL HIGH
Prothrombin Time: 23.9 — ABNORMAL HIGH

## 2010-12-21 LAB — COMPREHENSIVE METABOLIC PANEL
ALT: 34
AST: 30
Albumin: 4.5
Calcium: 9.5
Chloride: 100
Creatinine, Ser: 0.97
GFR calc Af Amer: >60
GFR calc non Af Amer: >60
Sodium: 138

## 2010-12-21 LAB — TSH: TSH: 3.12

## 2010-12-21 LAB — DIFFERENTIAL
Basophils Relative: 1
Lymphocytes Relative: 27
Lymphs Abs: 1.4
Monocytes Absolute: 0.6
Monocytes Relative: 11
Neutro Abs: 3.3
Neutrophils Relative %: 61

## 2010-12-21 LAB — BASIC METABOLIC PANEL
BUN: 16
BUN: 18
Calcium: 9.5
Creatinine, Ser: 1.05
Creatinine, Ser: 1.23
GFR calc Af Amer: >60
GFR calc non Af Amer: 58 — ABNORMAL LOW
Glucose, Bld: 149 — ABNORMAL HIGH

## 2010-12-21 LAB — I-STAT 8, (EC8 V) (CONVERTED LAB)
Acid-Base Excess: 1
Glucose, Bld: 116 — ABNORMAL HIGH
Potassium: 3.5
TCO2: 26
pCO2, Ven: 36.2 — ABNORMAL LOW
pH, Ven: 7.452 — ABNORMAL HIGH

## 2010-12-21 LAB — POCT I-STAT CREATININE
Creatinine, Ser: 0.9
Operator id: 270111

## 2010-12-21 LAB — CBC
Hemoglobin: 12 — ABNORMAL LOW
RBC: 3.9 — ABNORMAL LOW
WBC: 5.4

## 2010-12-21 LAB — POCT CARDIAC MARKERS
CKMB, poc: 1.9
Myoglobin, poc: 128
Operator id: 270111
Troponin i, poc: <0.05

## 2010-12-21 LAB — DIGOXIN LEVEL: Digoxin Level: 0.5 — ABNORMAL LOW

## 2010-12-21 LAB — D-DIMER, QUANTITATIVE: D-Dimer, Quant: 0.68 — ABNORMAL HIGH

## 2010-12-21 LAB — CK TOTAL AND CKMB (NOT AT ARMC): Relative Index: 2.4

## 2010-12-21 NOTE — Patient Instructions (Signed)
Your physician wants you to follow-up in: 12 months with Dr Taylor You will receive a reminder letter in the mail two months in advance. If you don't receive a letter, please call our office to schedule the follow-up appointment.  Remote monitoring is used to monitor your Pacemaker of ICD from home. This monitoring reduces the number of office visits required to check your device to one time per year. It allows us to keep an eye on the functioning of your device to ensure it is working properly. You are scheduled for a device check from home on 03/22/2011 You may send your transmission at any time that day. If you have a wireless device, the transmission will be sent automatically. After your physician reviews your transmission, you will receive a postcard with your next transmission date.    

## 2010-12-22 ENCOUNTER — Ambulatory Visit: Payer: PRIVATE HEALTH INSURANCE | Attending: Internal Medicine | Admitting: Physical Therapy

## 2010-12-22 DIAGNOSIS — IMO0001 Reserved for inherently not codable concepts without codable children: Secondary | ICD-10-CM | POA: Insufficient documentation

## 2010-12-22 DIAGNOSIS — R55 Syncope and collapse: Secondary | ICD-10-CM | POA: Insufficient documentation

## 2010-12-23 ENCOUNTER — Encounter: Payer: Self-pay | Admitting: Internal Medicine

## 2010-12-23 NOTE — Assessment & Plan Note (Signed)
His device is working normally. Will recheck in several months. 

## 2010-12-23 NOTE — Assessment & Plan Note (Signed)
His symptoms remain class 2. He will continue his current meds. He has previously been intolerant of beta blockers.

## 2010-12-23 NOTE — Assessment & Plan Note (Signed)
He denies anginal symptoms and his CHF remains class 2. He will continue his current meds.

## 2010-12-23 NOTE — Progress Notes (Signed)
HPI [name] returns today for followup. He is a pleasant 77 yo man with a h/o CAD, s/p CABG, ICM, chronic systolic CHF, HTN, CHB, s/p BiV ICD. He denies c/p, sob or syncope. He does have peripheral edema. He admits to sodium indiscretion. No other complaints today. He remains active.  Allergies  Allergen Reactions  . Aspirin      Current Outpatient Prescriptions  Medication Sig Dispense Refill  . allopurinol (ZYLOPRIM) 300 MG tablet Take 300 mg by mouth daily.        [name] atorvastatin (LIPITOR) 20 MG tablet Take 20 mg by mouth daily.        [name] CALCIUM-VITAMIN D PO Take 1 tablet by mouth daily.        . Cyanocobalamin (VITAMIN B-12 PO) Take 1 tablet by mouth daily.        . fish oil-omega-3 fatty acids 1000 MG capsule Take 2 g by mouth daily.        . furosemide (LASIX) 40 MG tablet Take 40 mg by mouth daily.        [name] glipiZIDE (GLUCOTROL) 5 MG tablet Take 5 mg by mouth. Take 1/2 tablet daily       . lisinopril (PRINIVIL,ZESTRIL) 20 MG tablet Take 20 mg by mouth daily.        [name] omeprazole (PRILOSEC) 20 MG capsule Take 20 mg by mouth daily.        [name] warfarin (COUMADIN) 2.5 MG tablet Take 2.5 mg by mouth. As directed          Past Medical History  Diagnosis Date  . Diabetes mellitus   . Hypertension   . Lung cancer   . CHF (congestive heart failure)   . CVA (cerebral vascular accident)   . MI (myocardial infarction)   . Pacemaker   . S/P CABG (coronary artery bypass graft)   . Gout   . Nephrolithiasis     ROS:   All systems reviewed and negative except as noted in the HPI.   Past Surgical History  Procedure Date  . Colonoscopy   . Upper endoscpopy   . Icd insertion     status post BIV     Family History  Problem Relation Age of Onset  . Alzheimer's disease Father 38  . Cancer Mother 54    metastatic cancer  . Diabetes Sister 14  . Diabetes Brother   . Diabetes Brother   . Diabetes Brother      History   Social History  . Marital Status: Widowed    Spouse  Name: N/A    Number of Children: N/A  . Years of Education: N/A   Occupational History  . retired     Medical laboratory scientific officer   Social History Main Topics  . Smoking status: Never Smoker   . Smokeless tobacco: Not on file  . Alcohol Use: Yes  . Drug Use: Not on file  . Sexually Active: Not on file   Other Topics Concern  . Not on file   Social History Narrative  . No narrative on file     BP 133/68  Pulse 59  Wt 150 lb 12.8 oz (68.402 kg)  Physical Exam:  Well appearing elderly man, NAD HEENT: Unremarkable Neck:  No JVD, no thyromegally Lymphatics:  No adenopathy Back:  No CVA tenderness Lungs:  Clear except for basilar rales. No wheezes. Well healed PPM incision. HEART:  Regular rate rhythm, no murmurs, no rubs, no clicks Abd:  soft, positive  bowel sounds, no organomegally, no rebound, no guarding Ext:  2 plus pulses, no edema, no cyanosis, no clubbing Skin:  No rashes no nodules Neuro:  CN II through XII intact, motor grossly intact  DEVICE  Normal device function.  See PaceArt for details.   Assess/Plan:

## 2010-12-23 NOTE — H&P (Signed)
NAMETOBBY, FAWCETT                ACCOUNT NO.:  [id number]  MEDICAL RECORD NO.:  [id number]  LOCATION:                                 FACILITY:  PHYSICIAN:  Lonia Blood, M.D.      DATE OF BIRTH:  November 08, 1932  DATE OF ADMISSION:  12/13/2010 DATE OF DISCHARGE:                             HISTORY & PHYSICAL   PRIMARY CARE PHYSICIAN:  Massie Maroon, MD  PRESENTING COMPLAINT:  Presyncope.  HISTORY OF PRESENT ILLNESS:  The patient is a 77 year old gentleman with known history of CHF and coronary artery disease and multiple episodes of syncopes in the past.  He has an AICD in place due to low EF of 25% and has had multiple hospitalizations again with syncope, chest pain, etc.  He came in today secondary to family reporting a presyncopal episode at home.  He was apparently sitting with the family and slumped over.  There is complete to report as to whether he passed out or almost passed out, but he appeared pale and was having episode of lightheadedness.  The patient himself said he was "just not feeling good."  He denied any chest pain.  No shortness of breath.  He denied any focal weakness, nausea, vomiting or diarrhea.  He has had some palpitations in the past, but not today.  No speech changes.  No facial droop.  PAST MEDICAL HISTORY:  Significant for: 1. Coronary artery disease.  He is status post CABG. 2. History of previous syncopal episodes, status post implantable     cardioverter defibrillator secondary to ischemic cardiomyopathy     with EF of 25%. 3. History of diabetes. 4. Hypertension. 5  Nephrolithiasis. 1. History of pulmonary asbestosis. 2. History of prostate cancer. 3. History of COPD, on oxygen at home at 2 liters. 4. Prior use of alcohol abuse. 5. Chronic kidney disease. 6. GERD.  ALLERGIES:  ASPIRIN that causes hives.  MEDICATIONS:  Include allopurinol, atorvastatin, Coumadin, furosemide, isosorbide mononitrate, Lanoxin and lisinopril, metoprolol,  omeprazole and potassium chloride.  SOCIAL HISTORY:  The patient lives in [place] with family.  Denied tobacco use or IV drug use.  Occasional alcohol use.  He drinks beer about fifth of every month.  Daughter lives with him.  FAMILY HISTORY:  His father died at the age of 47 from MI.  Father also had colon cancer.  The patient has 3 brothers who have diabetes.  He has 1 sister that died also from diabetes.  REVIEW OF SYSTEMS:  All systems reviewed are negative, except per the HPI.  PHYSICAL EXAMINATION:  VITAL SIGNS:  Temperature 98.0, blood pressure 105/59, pulse 61, respiratory rate of 19.  His sats 100% on room air. GENERAL:  He is awake, alert, and oriented; looks tired, weak but in no acute distress. HEENT:  PERRL.  EOMI.  No significant pallor.  No jaundice.  No rhinorrhea. NECK:  Supple.  No visible JVD.  No lymphadenopathy. RESPIRATORY:  He has good air entry bilaterally.  No wheezes.  No rales. No crackles. CARDIOVASCULAR SYSTEM:  He has paced rhythm.  No murmurs. EXTREMITIES:  Trace edema. SKIN:  No rashes.  No ulcers.  LABORATORY DATA:  White count is 5.8, hemoglobin 10.5 with an MCV of 92, platelet count of 129.  Initial cardiac enzymes are negative.  PT 23.8, INR 2.09.  Sodium 132, potassium 4.3, chloride 94, CO2 of 29, his glucose is 103, BUN 31, creatinine 2.39, calcium 9.8.  Total protein 7.2, albumin 3.9, AST 26.  BNP is 1412.  Chest x-ray showed emphysematous changes with probable chronic fibrosis and left pleural thickening.  There is cardiac enlargement, but no focal airspace disease.  His head CT without contrast showed advanced atrophy and microvascular ischemic disease without definite superimposed acute intracranial process.  His EKG showed paced rhythm.  No significant ST changes.  ASSESSMENT:  The patient is a 77 year old gentleman presenting with presyncopal episode and possibly syncopal episode.  The patient has automatic implantable  cardioverter defibrillator in place.  Seems to have to some elements of dehydration, hyponatremia, bicytopenia among other things.  Also, the patient has acute renal insufficiency.  His previous creatinine in June of this year was normal at 1.0.  PLAN: 1. Presyncope, more than likely secondary to dehydration or cardiac     causes.  The patient has an AICD in place.  We will recommend to be     interrogated to see if this is arrhythmia.  Otherwise, based on his     acute renal failure and hyponatremia, I will suspect dehydration.     He is not orthostatic at this point.  We will admit him to the     hospital.  Due to his very low EF, we will avoid aggressive fluids,     but we will hold diuretics and watch him closely.  We will put him     on observation and consider reconsulting Cardiology.  His     electrophysiologist is Dr. Lewayne Bunting. 2. Coronary artery disease.  I will cycle his enzymes and put him on     tele. 3. Ischemic cardiomyopathy.  Again, he seems compensated at this point     and he is now here for his CHF. 4. History of prostate cancer.  He seems stable at this point. 5. Diabetes.  I will put him on sliding scale insulin. 6. Hypertension.  Blood pressure is borderline.  I will continue home     medicine except for diuretics. 7. GERD.  Continuous PPIs.     Lonia Blood, M.D.     Verlin Grills  D:  12/14/2010  T:  12/14/2010  Job:  [id number]  Electronically Signed by Lonia Blood M.D. on 12/23/2010 03:22:58 PM

## 2010-12-25 NOTE — Discharge Summary (Signed)
NAMEBELMONT, VALLI NO.:  [id number]  MEDICAL RECORD NO.:  [id number]  LOCATION:  2014                         FACILITY:  [facility]  PHYSICIAN:  Thad Ranger, MD       DATE OF BIRTH:  10/01/1932  DATE OF ADMISSION:  12/13/2010 DATE OF DISCHARGE:                        DISCHARGE SUMMARY - REFERRING   PRIMARY CARE PHYSICIAN:  Massie Maroon, MD.  CARDIOLOGIST:  [name], MD.  FINAL DISCHARGE DIAGNOSES: 1. Near syncope likely secondary to dehydration or orthostasis or     vasovagal. 2. Left internal carotid artery occlusion. 3. Status post implantable cardioverter-defibrillator secondary to     ischemic cardiomyopathy. 4. Hypertension. 5. History of diabetes. 6. Nephrolithiasis. 7. History of prostate carcinoma. 8. History of pulmonary acidosis. 9. History of chronic obstructive pulmonary disease, on home oxygen 2     L. 10.Acute kidney injury likely secondary to medications and prerenal,     improved.  DISCHARGE MEDICATIONS: 1. Albuterol nebs one neb inhaled b.i.d. 2. Lasix 40 mg p.o. daily. 3. Lisinopril 10 mg p.o. daily. 4. Albuterol inhaler 2 puffs inhaled every 4 hours as needed for     shortness of breath. 5. Allopurinol 300 mg p.o. q.a.m. 6. Coumadin 2.5 mg half tablet on Friday, one tablet on Tuesday. 7. Fish oil 3 capsules p.o. q.a.m. 8. Isosorbide mononitrate XR 30 mg p.o. q.a.m. 9. Digoxin 0.125 mg 1 tablet p.o. q.a.m. 10.Lipitor 20 mg p.o. q.a.m. 11.Metoprolol XL 25 mg p.o. q.a.m. 12.Nitroglycerin sublingual 0.4 mg 1 tablet every 5 minutes as needed     for chest pain. 13.Omeprazole 20 mg p.o. q.a.m. 14.Vitamin B12 one tablet p.o. q.a.m.  BRIEF HISTORY OF PRESENT ILLNESS:  At the time of admission, Mr. Soy is a 77 year old male with known history of CHF, coronary artery disease, and multiple episodes of syncopes in the past.  He has an ICD in place due to low EF of 25% on multiple admissions with chest pain and syncope, etc.   The patient presented to the ER secondary to family reporting a presyncopal episode at home.  He was apparently sitting with the family and slumped over.  The patient appeared to be pale, was having episode of lightheadedness, otherwise, no chest pain, shortness of breath, any focal weakness, nausea, vomiting, or diarrhea.  RADIOLOGICAL DATA:  December 13, 2010, chest x-ray 2-view, emphysematous changes with probable chronic fibrosis and left pleural thickening and cardiac enlargement.  CT of the head without contrast advanced atrophy and microvascular ischemic disease without superimposed acute intracranial process.  No evidence for obstructive uropathy, echogenic kidneys suggesting medical renal disease.  Carotid Dopplers showed mild calcified plaque origin ICA in the right, no ICA stenosis.  Left ICA appears occluded bilateral particular tree flow in antegrade. Transcranial duplex study on December 14, 2010, showed low right middle cerebral mean flow velocity suggest a system of globally elevated pulsatility base that has diffuse intracranial atherosclerosis.  BRIEF HOSPITALIZATION COURSE:  Mr. Cubit is a 77 year old male who was admitted with presyncopal episode.  Near syncope, probably due to medication effect and orthostatic by history.  The patient was admitted to the tele monitor floor and was ruled out for acute  ACS.  No arrhythmias were noted on the ICD recheck report.  On the carotid Doppler, the patient was noted to have left ICA occluded. Vascular Surgery was consulted along with Cardiology consult. The patient underwent transcranial Dopplers which showed low right middle cerebral mean flow velocity suggested to stenosis.  Per the Vascular Surgery, no data to support new revascularization of occlusion and likely not the cause of the syncope.  I recommended to follow up with the Cardiology or Vascular Surgery for LICA occlusion.  The patient will be discharged to home today.   He was followed by physical therapy evaluation and recommended outpatient rehab/PT or the neuro rehab.  He probably was sent with the case manager assistance.  PHYSICAL EXAMINATION:  VITAL SIGNS:  At the time of the discharge, temperature 98.1, pulse 74, respirations 18, blood pressure 116/72, O2 sat 99% on room air. GENERAL:  The patient is alert, awake, and oriented x3, not in any acute distress. HEENT:  Anicteric sclerae.  Pink conjunctivae.  Pupils are reactive to light and accommodation.  EOMI. NECK:  Supple.  No lymphadenopathy.  No JVD. CVS:  S1 and S2 clear. CHEST:  Clear to auscultation bilaterally. ABDOMEN:  Soft, nontender, nondistended.  Normal bowel sounds. EXTREMITIES:  No cyanosis, clubbing, or edema in upper or lower extremities bilaterally.  DISCHARGE FOLLOWUP:  Follow up with Dr. Aleen Campi in 2 weeks for hospital followup, Dr. Leonides Sake in vascular surgery in about 2 weeks.  DISCHARGE TIME:  35 minutes.     Thad Ranger, MD     RR/MEDQ  D:  12/15/2010  T:  12/15/2010  Job:  [id number]  cc:   [name], MD Massie Maroon, MD Fransisco Hertz, MD  Electronically Signed by Andres Labrum RAI  on 12/25/2010 02:17:54 PM

## 2010-12-31 NOTE — Consult Note (Signed)
[name], [name]                ACCOUNT NO.:  [id number]  MEDICAL RECORD NO.:  [id number]  LOCATION:  2014                         FACILITY:  [facility]  PHYSICIAN:  Jesse Sans. Aalijah Lanphere, MD, FACCDATE OF BIRTH:  Feb 23, 1933  DATE OF CONSULTATION:  12/14/2010 DATE OF DISCHARGE:                                CONSULTATION   PRIMARY CARDIOLOGIST:  [name]. [name], MD  PRIMARY CARE PHYSICIAN:  Massie Maroon, MD  REASON FOR CONSULTATION:  Syncope.  HISTORY OF PRESENT ILLNESS:  [name] is a delightful 77 year old white male who has a history of an ischemic cardiomyopathy and a biventricular pacer and defibrillator.  He is followed by Dr. Lewayne Bunting at [facility].  His most recent catheterization in 2008, showed stable anatomy.  He is being treated with medical therapy.  He has severe COPD and wears home O2.  On the day of admission, he had gone to pick up some dog food at the feed meal.  He got out of his jeep and walked around the front up the steps, told the man what he wanted and then does not remember anything. He had no antecedent symptoms of angina or chest pain.  He denied any palpitations.  He did not recall his defibrillator going off.  He had no neurological symptoms as well including headache, visual changes, diplopia, difficulty swallowing, slurred speech.  There was no pleuritic chest pain.  He woke up and he was clear.  There was no seizure activity noted.  Defibrillator was interrogated since admission and shows no ventricular arrhythmias and no discharges.  He had carotid Dopplers done which showed a total left internal carotid artery stenosis.  He has no stenosis in the right internal carotid artery.  There is bilateral vertebral antegrade flow.  Cranial Dopplers are pending.  His last ejection fraction was in the range of 20-25%.  Last echocardiogram was June 06, 2010, of this year which indeed showed EF of 25% with multiple Delvis Kau motion abnormalities.   There was a flap calcified thrombus at the apex.  There was sclerotic aortic valve, but no stenosis.  There was no significant mitral regurgitation.  Pulmonary pressures appeared to be difficult to assess.  PAST MEDICAL HISTORY: 1. Remarkable for the above, but also includes a history of chronic     systolic heart failure, complete heart block, status post     biventricular pacer and defibrillator.  His initial pacemaker was     placed in 1976 per the patient. 2. Previous coronary artery bypass grafting with a LIMA to the LAD,     vein graft to a diagonal, vein graft to obtuse marginal. 3. History of ischemic cardiomyopathy with multiple Yuvan Medinger motion     abnormalities. 4. Chronic AFib on chronic Coumadin. 5. History of stroke. 6. Hypertension. 7. 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 8. Non-insulin dependent diabetes. 9. History of hypertension. 10.History of partial small-bowel obstruction. 11.History of pulmonary asbestosis. 12.Gout. 13.Nephrolithiasis. 14.Status post left rotator cuff surgery.  SOCIAL HISTORY:  He lives at home with his daughter.  He does not use tobacco.  He occasionally drinks alcohol that includes liquor and beer.  FAMILY HISTORY:  Noncontributory for  premature coronary artery disease.  ALLERGIES:  ASPIRIN causes hives.  MEDICATIONS AT HOME:  Were reviewed and are in medicine reconciliation. I will not repeat them at this time.  CARDIAC MEDICATIONS: 1. Sublingual nitroglycerin p.r.n. 2. Metoprolol XL succinate 25 mg p.o. q.a.m. 3. Lisinopril 20 mg p.o. every morning. 4. Isosorbide mononitrate 30 mg p.o. q.a.m. 5. Lanoxin 0.125 mg p.o. daily. 6. Lipitor 20 mg p.o. q.a.m. 7. Furosemide 40 mg twice a day. 8. Coumadin.  REVIEW OF SYSTEMS:  He denies any nausea or vomiting, decreased p.o. intake, fever, chills, sweats, diarrhea, melena, hematochezia, blood loss of any kind.  Other review of systems are negative.  PHYSICAL EXAMINATION:  GENERAL:  He is an  elderly gentleman in no acute distress.  He is very energetic and quite humorous. VITAL SIGNS:  His blood pressure is 115/72, pulse 62 and he is paced, temperature is 97.5, respirations 18 and 100% O2 on nasal cannula. HEENT:  Somewhat disheveled.  Dentition in poor shape.  PERRLA and extraocular movements intact.  Facial symmetry is normal. NECK:  Supple.  There is no carotid bruit heard.  Thyroid is not enlarged.  Trachea is midline.  No obvious adenopathy. LUNGS:  Were clear with decreased breath sounds throughout with no wheezes. HEART:  Regular rate and rhythm with no gallop.  There is soft systolic murmur along the left sternal border.  S2 splits. ABDOMEN:  Soft, good bowel sounds.  No obvious organomegaly. EXTREMITIES:  No cyanosis, clubbing or edema.  Pulses were reduced, but present. NEURO:  Grossly intact.  He is alert and oriented x3. SKIN:  Thin with some ecchymoses.  LABORATORY DATA:  Chest x-ray and EKG all reviewed.  Pertinent information is cardiac enzymes are negative x3.  Digoxin level was therapeutic at 0.8.  TSH is normal.  He was prerenal on admission with a BUN of 31, creatinine of 2.39.  His last BUN and creatinine prior to that showed a creatinine of 1.0.  His Lasix as well as his lisinopril has been held and his last BNP was today which was a creatinine of 1.86, BUN of 31.  ASSESSMENT AND PLAN:  Syncope which was most likely orthostatic by history in the fact that he was prerenal and dehydrated on admission.  I do not feel this is related to an arrhythmia with the CT report being benign nor coronary ischemia.  The left total internal carotid artery is probably old and is not symptomatic at this time.  I would not recommend any surgery for this.  RECOMMENDATIONS:  I would check orthostatic blood pressures in the morning prior to discharge.  I would review orthostatic precautions with him.  He needs congestive heart failure.  The patient information  book from our Heart Failure Service.  I would decrease his lisinopril to 10 mg q.a.m. and his Lasix down to 40 mg q.a.m. with daily weights.  The patient also tells me that [facility]  I certainly would not do any cardiac evaluation at this time until this is further defined.  He is clearly a medical therapy candidate only in my opinion.  Thank you for the consultation.     Kaytelynn Scripter C. Daleen Squibb, MD, [facility]     TCW/MEDQ  D:  12/14/2010  T:  12/15/2010  Job:  [id number]  Electronically Signed by Valera Castle MD [facility] on 12/31/2010 01:50:28 PM

## 2011-01-09 ENCOUNTER — Ambulatory Visit
Admission: RE | Admit: 2011-01-09 | Discharge: 2011-01-09 | Disposition: A | Discharge status: Home / Self Care | Payer: PRIVATE HEALTH INSURANCE | Source: Ambulatory Visit | Attending: Radiation Oncology | Admitting: Radiation Oncology

## 2011-01-09 DIAGNOSIS — I10 Essential (primary) hypertension: Secondary | ICD-10-CM | POA: Insufficient documentation

## 2011-01-09 DIAGNOSIS — I252 Old myocardial infarction: Secondary | ICD-10-CM | POA: Insufficient documentation

## 2011-01-09 DIAGNOSIS — Z79899 Other long term (current) drug therapy: Secondary | ICD-10-CM | POA: Insufficient documentation

## 2011-01-09 DIAGNOSIS — Z8042 Family history of malignant neoplasm of prostate: Secondary | ICD-10-CM | POA: Insufficient documentation

## 2011-01-09 DIAGNOSIS — M109 Gout, unspecified: Secondary | ICD-10-CM | POA: Insufficient documentation

## 2011-01-09 DIAGNOSIS — J60 Coalworker's pneumoconiosis: Secondary | ICD-10-CM | POA: Insufficient documentation

## 2011-01-09 DIAGNOSIS — Z950 Presence of cardiac pacemaker: Secondary | ICD-10-CM | POA: Insufficient documentation

## 2011-01-09 DIAGNOSIS — Z8673 Personal history of transient ischemic attack (TIA), and cerebral infarction without residual deficits: Secondary | ICD-10-CM | POA: Insufficient documentation

## 2011-01-09 DIAGNOSIS — Z7901 Long term (current) use of anticoagulants: Secondary | ICD-10-CM | POA: Insufficient documentation

## 2011-01-09 DIAGNOSIS — E119 Type 2 diabetes mellitus without complications: Secondary | ICD-10-CM | POA: Insufficient documentation

## 2011-01-09 DIAGNOSIS — C61 Malignant neoplasm of prostate: Secondary | ICD-10-CM | POA: Insufficient documentation

## 2011-01-09 DIAGNOSIS — I519 Heart disease, unspecified: Secondary | ICD-10-CM | POA: Insufficient documentation

## 2011-01-09 DIAGNOSIS — E7800 Pure hypercholesterolemia, unspecified: Secondary | ICD-10-CM | POA: Insufficient documentation

## 2011-02-09 ENCOUNTER — Inpatient Hospital Stay (HOSPITAL_COMMUNITY)
Admission: EM | Admit: 2011-02-09 | Discharge: 2011-02-11 | DRG: 312 | Disposition: A | Discharge status: Home / Self Care | Payer: PRIVATE HEALTH INSURANCE | Attending: Family Medicine | Admitting: Family Medicine

## 2011-02-09 ENCOUNTER — Encounter (HOSPITAL_COMMUNITY): Payer: Self-pay | Admitting: Emergency Medicine

## 2011-02-09 ENCOUNTER — Emergency Department (HOSPITAL_COMMUNITY): Payer: PRIVATE HEALTH INSURANCE

## 2011-02-09 DIAGNOSIS — I951 Orthostatic hypotension: Principal | ICD-10-CM | POA: Diagnosis present

## 2011-02-09 DIAGNOSIS — I252 Old myocardial infarction: Secondary | ICD-10-CM

## 2011-02-09 DIAGNOSIS — Z79899 Other long term (current) drug therapy: Secondary | ICD-10-CM

## 2011-02-09 DIAGNOSIS — E860 Dehydration: Secondary | ICD-10-CM | POA: Diagnosis present

## 2011-02-09 DIAGNOSIS — R4182 Altered mental status, unspecified: Secondary | ICD-10-CM

## 2011-02-09 DIAGNOSIS — J4489 Other specified chronic obstructive pulmonary disease: Secondary | ICD-10-CM | POA: Diagnosis present

## 2011-02-09 DIAGNOSIS — I10 Essential (primary) hypertension: Secondary | ICD-10-CM | POA: Diagnosis present

## 2011-02-09 DIAGNOSIS — E119 Type 2 diabetes mellitus without complications: Secondary | ICD-10-CM | POA: Diagnosis present

## 2011-02-09 DIAGNOSIS — Z833 Family history of diabetes mellitus: Secondary | ICD-10-CM

## 2011-02-09 DIAGNOSIS — G459 Transient cerebral ischemic attack, unspecified: Secondary | ICD-10-CM

## 2011-02-09 DIAGNOSIS — Z8673 Personal history of transient ischemic attack (TIA), and cerebral infarction without residual deficits: Secondary | ICD-10-CM

## 2011-02-09 DIAGNOSIS — Z951 Presence of aortocoronary bypass graft: Secondary | ICD-10-CM

## 2011-02-09 DIAGNOSIS — Z8546 Personal history of malignant neoplasm of prostate: Secondary | ICD-10-CM

## 2011-02-09 DIAGNOSIS — I2510 Atherosclerotic heart disease of native coronary artery without angina pectoris: Secondary | ICD-10-CM | POA: Diagnosis present

## 2011-02-09 DIAGNOSIS — J449 Chronic obstructive pulmonary disease, unspecified: Secondary | ICD-10-CM | POA: Diagnosis present

## 2011-02-09 DIAGNOSIS — I2589 Other forms of chronic ischemic heart disease: Secondary | ICD-10-CM | POA: Diagnosis present

## 2011-02-09 DIAGNOSIS — D649 Anemia, unspecified: Secondary | ICD-10-CM | POA: Diagnosis present

## 2011-02-09 DIAGNOSIS — N179 Acute kidney failure, unspecified: Secondary | ICD-10-CM

## 2011-02-09 DIAGNOSIS — Z7901 Long term (current) use of anticoagulants: Secondary | ICD-10-CM

## 2011-02-09 DIAGNOSIS — R55 Syncope and collapse: Secondary | ICD-10-CM | POA: Diagnosis present

## 2011-02-09 DIAGNOSIS — I6529 Occlusion and stenosis of unspecified carotid artery: Secondary | ICD-10-CM | POA: Diagnosis present

## 2011-02-09 DIAGNOSIS — Z9981 Dependence on supplemental oxygen: Secondary | ICD-10-CM

## 2011-02-09 DIAGNOSIS — I4891 Unspecified atrial fibrillation: Secondary | ICD-10-CM | POA: Diagnosis present

## 2011-02-09 DIAGNOSIS — T502X5A Adverse effect of carbonic-anhydrase inhibitors, benzothiadiazides and other diuretics, initial encounter: Secondary | ICD-10-CM | POA: Diagnosis present

## 2011-02-09 DIAGNOSIS — Z95810 Presence of automatic (implantable) cardiac defibrillator: Secondary | ICD-10-CM

## 2011-02-09 DIAGNOSIS — I509 Heart failure, unspecified: Secondary | ICD-10-CM | POA: Diagnosis present

## 2011-02-09 DIAGNOSIS — I5042 Chronic combined systolic (congestive) and diastolic (congestive) heart failure: Secondary | ICD-10-CM | POA: Diagnosis present

## 2011-02-09 LAB — URINALYSIS, ROUTINE W REFLEX MICROSCOPIC
Glucose, UA: NEGATIVE mg/dL
Hgb urine dipstick: NEGATIVE
Ketones, ur: NEGATIVE mg/dL
Protein, ur: NEGATIVE mg/dL

## 2011-02-09 LAB — COMPREHENSIVE METABOLIC PANEL
BUN: 28 mg/dL — ABNORMAL HIGH (ref 6–23)
CO2: 28 mEq/L (ref 19–32)
Calcium: 9.8 mg/dL (ref 8.4–10.5)
Creatinine, Ser: 1.62 mg/dL — ABNORMAL HIGH (ref 0.50–1.35)
GFR calc Af Amer: 46 mL/min — ABNORMAL LOW (ref >90)
GFR calc non Af Amer: 40 mL/min — ABNORMAL LOW (ref >90)
Glucose, Bld: 133 mg/dL — ABNORMAL HIGH (ref 70–99)

## 2011-02-09 LAB — CARDIAC PANEL(CRET KIN+CKTOT+MB+TROPI)
CK, MB: 2.1 ng/mL (ref 0.3–4.0)
Troponin I: <0.3 ng/mL (ref <0.30)

## 2011-02-09 LAB — DIFFERENTIAL
Eosinophils Absolute: 0.1 10*3/uL (ref 0.0–0.7)
Eosinophils Relative: 1 % (ref 0–5)
Lymphs Abs: 0.8 10*3/uL (ref 0.7–4.0)
Monocytes Relative: 9 % (ref 3–12)

## 2011-02-09 LAB — CBC
MCH: 32.2 pg (ref 26.0–34.0)
MCV: 94.6 fL (ref 78.0–100.0)
Platelets: 168 10*3/uL (ref 150–400)
RBC: 3.32 MIL/uL — ABNORMAL LOW (ref 4.22–5.81)

## 2011-02-09 MED ORDER — IOHEXOL 300 MG/ML  SOLN
75.0000 mL | Freq: Once | INTRAMUSCULAR | Status: AC | PRN
  Administered 2011-02-09: 75 mL via INTRAVENOUS

## 2011-02-09 NOTE — ED Notes (Signed)
Daughter of pt at bedside, states that pt at around 1500 told her that he didn't feel good and she noticed that his gait was not very steady. She then said she walked him to the house and he started having a slight cough and wasn't able to get himself into his bed, she then states that he laid on the bed and became unresponsive for appox , he was breathing on his own but would not respond to family member. Pt at this time does remember walking into the house but after that has no memory of the event. Pt at this time denies any pain. Does states that his lt sided chin in numb.

## 2011-02-09 NOTE — ED Notes (Signed)
Patient denies pain and is resting comfortably.  

## 2011-02-09 NOTE — ED Provider Notes (Addendum)
History     CSN: [id number] Arrival date & time: 02/09/2011  4:03 PM   First MD Initiated Contact with Patient 02/09/11 1709      Chief Complaint  Patient presents with  . Chest Pain  . Altered Mental Status    (Consider location/radiation/quality/duration/timing/severity/associated sxs/prior treatment) Patient is a 77 y.o. male presenting with chest pain and altered mental status. The history is provided by the patient and a relative.  Chest Pain Primary symptoms include nausea, vomiting, dizziness and altered mental status. Pertinent negatives for primary symptoms include no fever, no shortness of breath, no cough, no palpitations and no abdominal pain.  Dizziness also occurs with nausea and vomiting. Dizziness does not occur with diaphoresis.  Pertinent negatives for associated symptoms include no diaphoresis.    Altered Mental Status Pertinent negatives include no chest pain, no abdominal pain, no headaches and no shortness of breath.   the patient is a 77 year old male, with history of myocardial infarction, and stroke as well as prostate cancer, who presents to the emergency department after he had an episode of weakness and confusion.  He was with his daughter returning to the house from the car.  When she noted that his gait was shuffling, which is unusual.  He walked to the house with her assistance and then she and her sister assisted.  The patient into bed.  He laid down and his eyes back into the back of his head and he was briefly confused.  He denies pain at the time.  He did have one episode of nausea and vomiting.  He denies nausea, now.  He denies a headache, vision changes, or weakness in his arms or legs.  He does not recall any of these events.  Past Medical History  Diagnosis Date  . Diabetes mellitus   . Hypertension   . Lung cancer   . CHF (congestive heart failure)   . CVA (cerebral vascular accident)   . MI (myocardial infarction)   . Pacemaker   . S/P  CABG (coronary artery bypass graft)   . Gout   . Nephrolithiasis     Past Surgical History  Procedure Date  . Colonoscopy   . Upper endoscpopy   . Icd insertion     status post BIV    Family History  Problem Relation Age of Onset  . Alzheimer's disease Father 16  . Cancer Mother 7    metastatic cancer  . Diabetes Sister 39  . Diabetes Brother   . Diabetes Brother   . Diabetes Brother     History  Substance Use Topics  . Smoking status: Never Smoker   . Smokeless tobacco: Not on file  . Alcohol Use: Yes      Review of Systems  Constitutional: Negative for fever and diaphoresis.  HENT: Negative for neck pain.   Eyes: Negative for redness and visual disturbance.  Respiratory: Negative for cough, chest tightness and shortness of breath.   Cardiovascular: Negative for chest pain and palpitations.  Gastrointestinal: Positive for nausea and vomiting. Negative for abdominal pain and diarrhea.  Genitourinary: Negative for dysuria.  Musculoskeletal: Negative for back pain.  Neurological: Positive for dizziness and light-headedness. Negative for headaches.  Psychiatric/Behavioral: Positive for confusion and altered mental status.       Confusion has resolved    Allergies  Aspirin  Home Medications   Current Outpatient Rx  Name Route Sig Dispense Refill  . ALLOPURINOL 300 MG PO TABS Oral Take 300 mg by mouth  daily.      . ATORVASTATIN CALCIUM 20 MG PO TABS Oral Take 20 mg by mouth daily.      [name] CALCIUM-VITAMIN D PO Oral Take 1 tablet by mouth daily.      [name] VITAMIN B-12 PO Oral Take 1 tablet by mouth daily.      [name] DIGOXIN 0.125 MG PO TABS Oral Take 125 mcg by mouth daily.      [name] ENOXAPARIN SODIUM 40 MG/0.4ML [place] SOLN Subcutaneous Inject 40 mg into the skin daily.      . OMEGA-3 FATTY ACIDS 1000 MG PO CAPS Oral Take 2 g by mouth daily.      . FUROSEMIDE 40 MG PO TABS Oral Take 40 mg by mouth daily.      . ISOSORBIDE MONONITRATE ER 30 MG PO TB24 Oral Take 30 mg by mouth  daily.      [name] LISINOPRIL 10 MG PO TABS Oral Take 10 mg by mouth daily.      [name] METOPROLOL SUCCINATE ER 50 MG PO TB24 Oral Take 50 mg by mouth daily.      [name] OMEPRAZOLE 20 MG PO CPDR Oral Take 20 mg by mouth daily.      [name] POTASSIUM CHLORIDE CRYS CR 20 MEQ PO TBCR Oral Take 20 mEq by mouth 2 (two) times daily.      . WARFARIN SODIUM 2.5 MG PO TABS Oral Take 2.5 mg by mouth daily. Take 2.5 mg everyday except on Wednesday. Take 3.75 mg on Wednesday.      BP 116/60  Pulse 59  Temp(Src) 97.5 F (36.4 C) (Oral)  Resp 17  SpO2 100%  Physical Exam  Vitals reviewed. Constitutional: He is oriented to person, place, and time. He appears well-developed and well-nourished. No distress.  HENT:  Head: Normocephalic and atraumatic.  Eyes: EOM are normal. Pupils are equal, round, and reactive to light.  Neck: Normal range of motion. Neck supple.       No carotid bruit  Cardiovascular: Normal rate and regular rhythm.   Murmur heard. Pulmonary/Chest: Effort normal and breath sounds normal. No respiratory distress. He has no wheezes. He has no rales.  Abdominal: Soft. Bowel sounds are normal. He exhibits no distension and no mass. There is no tenderness. There is no rebound and no guarding.  Musculoskeletal: Normal range of motion. He exhibits no edema and no tenderness.  Neurological: He is alert and oriented to person, place, and time. No cranial nerve deficit.       Motor strength 5 over 5 bilaterally in both his upper extremities and lower extremities  Skin: Skin is warm and dry. He is not diaphoretic.  Psychiatric: He has a normal mood and affect. His behavior is normal. Thought content normal.    ED Course  Procedures (including critical care time)   77 year old male, with history of myocardial infarction, stroke, as well as prostate cancer, presents with a brief episode of weakness shuffling gait and confusion.  We will perform laboratory testing, and a CAT scan of his head both with and  without contrast to look for signs of a stroke or metastases to his brain.   Labs Reviewed  CBC - Abnormal; Notable for the following:    RBC 3.32 (*)    Hemoglobin 10.7 (*)    HCT 31.4 (*)    All other components within normal limits  DIFFERENTIAL - Abnormal; Notable for the following:    Neutrophils Relative 78 (*)    All other components  within normal limits  COMPREHENSIVE METABOLIC PANEL - Abnormal; Notable for the following:    Glucose, Bld 133 (*)    BUN 28 (*)    Creatinine, Ser 1.62 (*)    GFR calc non Af Amer 40 (*)    GFR calc Af Amer 46 (*)    All other components within normal limits  URINALYSIS, ROUTINE W REFLEX MICROSCOPIC   No results found.   asx now. PCP is dr. Selena Batten.  I spoke with the [facility] hospitalist.  She will admit the patient to the hospital.   MDM  TIA AMS Currently, the patient has no signs of altered mental status or stroke.  He is returned to normal.  However, given his history of myocardial infarction, and strokes in the past, as well as his symptoms and lack of memory of the events.  Today.  I feel that he should be admitted to the hospital.        Nicholes Stairs, MD 02/09/11 2246  Dr. Adela Glimpse asked me to have the pacemaker interrogated.  Therefore, I requested the secretary to page the [place] rep to come interrogate the pacer.. Review of records from June of 2010 demonstrates that he does have a [employer] pacemaker  Nicholes Stairs, MD 02/09/11 2130  Nicholes Stairs, MD 02/09/11 2343  I spoke with the [facility]. [employer] rep. She is coming.  Nicholes Stairs, MD 02/09/11 [phone]

## 2011-02-09 NOTE — ED Notes (Signed)
Pt from home with family c/o episode of lightheadedness and left sided facial numbness with nausea that has now resolved; pt c/o left sided CP at present; pt on home O2 but denies SOB

## 2011-02-09 NOTE — ED Notes (Signed)
Family at bedside. 

## 2011-02-10 DIAGNOSIS — R55 Syncope and collapse: Secondary | ICD-10-CM | POA: Diagnosis present

## 2011-02-10 DIAGNOSIS — E860 Dehydration: Secondary | ICD-10-CM | POA: Diagnosis present

## 2011-02-10 LAB — MAGNESIUM: Magnesium: 1.7 mg/dL (ref 1.5–2.5)

## 2011-02-10 LAB — DIGOXIN LEVEL: Digoxin Level: 0.6 ng/mL — ABNORMAL LOW (ref 0.8–2.0)

## 2011-02-10 LAB — CARDIAC PANEL(CRET KIN+CKTOT+MB+TROPI)
Relative Index: INVALID (ref 0.0–2.5)
Troponin I: <0.3 ng/mL (ref <0.30)

## 2011-02-10 LAB — PROTIME-INR: INR: 1.21 (ref 0.00–1.49)

## 2011-02-10 LAB — GLUCOSE, CAPILLARY
Glucose-Capillary: 127 mg/dL — ABNORMAL HIGH (ref 70–99)
Glucose-Capillary: 135 mg/dL — ABNORMAL HIGH (ref 70–99)

## 2011-02-10 LAB — APTT: aPTT: 37 seconds (ref 24–37)

## 2011-02-10 MED ORDER — SODIUM CHLORIDE 0.9 % IV SOLN: INTRAVENOUS | Status: AC

## 2011-02-10 MED ORDER — SODIUM CHLORIDE 0.9 % IJ SOLN
3.0000 mL | Freq: Two times a day (BID) | INTRAMUSCULAR | Status: DC
  Administered 2011-02-10 – 2011-02-11 (×3): 3 mL via INTRAVENOUS

## 2011-02-10 MED ORDER — POTASSIUM CHLORIDE CRYS ER 20 MEQ PO TBCR
20.0000 meq | EXTENDED_RELEASE_TABLET | Freq: Two times a day (BID) | ORAL | Status: DC
  Administered 2011-02-10 – 2011-02-11 (×3): 20 meq via ORAL
  Filled 2011-02-10 (×4): qty 1

## 2011-02-10 MED ORDER — METOPROLOL SUCCINATE ER 50 MG PO TB24
50.0000 mg | ORAL_TABLET | Freq: Every day | ORAL | Status: DC
  Administered 2011-02-10 – 2011-02-11 (×2): 50 mg via ORAL
  Filled 2011-02-10 (×2): qty 1

## 2011-02-10 MED ORDER — ALLOPURINOL 300 MG PO TABS
300.0000 mg | ORAL_TABLET | Freq: Every day | ORAL | Status: DC
  Administered 2011-02-10 – 2011-02-11 (×2): 300 mg via ORAL
  Filled 2011-02-10 (×2): qty 1

## 2011-02-10 MED ORDER — SODIUM CHLORIDE 0.9 % IJ SOLN
3.0000 mL | INTRAMUSCULAR | Status: DC | PRN
  Administered 2011-02-10: 3 mL via INTRAVENOUS

## 2011-02-10 MED ORDER — ONDANSETRON HCL 4 MG/2ML IJ SOLN: 4.0000 mg | Freq: Four times a day (QID) | INTRAMUSCULAR | Status: DC | PRN

## 2011-02-10 MED ORDER — INSULIN ASPART 100 UNIT/ML ~~LOC~~ SOLN
0.0000 [IU] | Freq: Every day | SUBCUTANEOUS | Status: DC
  Filled 2011-02-10: qty 3

## 2011-02-10 MED ORDER — SIMVASTATIN 40 MG PO TABS
40.0000 mg | ORAL_TABLET | Freq: Every day | ORAL | Status: DC
  Administered 2011-02-10: 40 mg via ORAL
  Filled 2011-02-10 (×2): qty 1

## 2011-02-10 MED ORDER — WARFARIN SODIUM 2.5 MG PO TABS: 2.5000 mg | ORAL_TABLET | Freq: Every day | ORAL | Status: DC

## 2011-02-10 MED ORDER — PANTOPRAZOLE SODIUM 40 MG PO TBEC
40.0000 mg | DELAYED_RELEASE_TABLET | Freq: Every day | ORAL | Status: DC
  Administered 2011-02-10 – 2011-02-11 (×2): 40 mg via ORAL
  Filled 2011-02-10 (×2): qty 1

## 2011-02-10 MED ORDER — WARFARIN SODIUM 4 MG PO TABS
4.0000 mg | ORAL_TABLET | Freq: Once | ORAL | Status: AC
  Administered 2011-02-10: 4 mg via ORAL
  Filled 2011-02-10: qty 1

## 2011-02-10 MED ORDER — DIGOXIN 125 MCG PO TABS
125.0000 ug | ORAL_TABLET | Freq: Every day | ORAL | Status: DC
  Administered 2011-02-10 – 2011-02-11 (×2): 125 ug via ORAL
  Filled 2011-02-10 (×2): qty 1

## 2011-02-10 MED ORDER — ISOSORBIDE MONONITRATE ER 30 MG PO TB24
30.0000 mg | ORAL_TABLET | Freq: Every day | ORAL | Status: DC
  Administered 2011-02-10 – 2011-02-11 (×2): 30 mg via ORAL
  Filled 2011-02-10 (×2): qty 1

## 2011-02-10 MED ORDER — ACETAMINOPHEN 325 MG PO TABS: 650.0000 mg | ORAL_TABLET | ORAL | Status: DC | PRN

## 2011-02-10 MED ORDER — SODIUM CHLORIDE 0.9 % IV SOLN: 250.0000 mL | INTRAVENOUS | Status: DC | PRN

## 2011-02-10 MED ORDER — INSULIN ASPART 100 UNIT/ML ~~LOC~~ SOLN
0.0000 [IU] | Freq: Three times a day (TID) | SUBCUTANEOUS | Status: DC
  Administered 2011-02-10 (×2): 2 [IU] via SUBCUTANEOUS
  Filled 2011-02-10 (×2): qty 3

## 2011-02-10 MED ORDER — NITROGLYCERIN 0.4 MG SL SUBL: 0.4000 mg | SUBLINGUAL_TABLET | SUBLINGUAL | Status: DC | PRN

## 2011-02-10 NOTE — Progress Notes (Signed)
ANTICOAGULATION CONSULT NOTE - Initial Consult  Pharmacy Consult for Coumadin Indication: afib/stroke  Allergies  Allergen Reactions  . Aspirin     Patient Measurements:    Vital Signs: Temp: 98 F (36.7 C) (12/01 0934) Temp src: Oral (12/01 0934) BP: 106/68 mmHg (12/01 0934) Pulse Rate: 63  (12/01 0934)  Labs:  Basename 02/10/11 1100 02/09/11 2255 02/09/11 1813  HGB -- -- 10.7*  HCT -- -- 31.4*  PLT -- -- 168  APTT 37 -- --  LABPROT 15.6* -- --  INR 1.21 -- --  HEPARINUNFRC -- -- --  CREATININE -- -- 1.62*  CKTOTAL 54 69 --  CKMB 2.3 2.1 --  TROPONINI <0.30 <0.30 --   The CrCl is unknown because both a height and weight (above a minimum accepted value) are required for this calculation.  Medical History: Past Medical History  Diagnosis Date  . Diabetes mellitus   . Hypertension   . CHF (congestive heart failure)   . CVA (cerebral vascular accident)   . MI (myocardial infarction)   . Pacemaker   . S/P CABG (coronary artery bypass graft)   . Gout   . Nephrolithiasis   . Lung cancer   . Colon cancer     Medications:  Prescriptions prior to admission  Medication Sig Dispense Refill  . allopurinol (ZYLOPRIM) 300 MG tablet Take 300 mg by mouth daily.        [name] atorvastatin (LIPITOR) 20 MG tablet Take 20 mg by mouth daily.        [name] CALCIUM-VITAMIN D PO Take 1 tablet by mouth daily.        . Cyanocobalamin (VITAMIN B-12 PO) Take 1 tablet by mouth daily.        . digoxin (LANOXIN) 0.125 MG tablet Take 125 mcg by mouth daily.        [name] enoxaparin (LOVENOX) 40 MG/0.4ML SOLN Inject 40 mg into the skin daily.        . fish oil-omega-3 fatty acids 1000 MG capsule Take 2 g by mouth daily.        . furosemide (LASIX) 40 MG tablet Take 40 mg by mouth daily.        . isosorbide mononitrate (IMDUR) 30 MG 24 hr tablet Take 30 mg by mouth daily.        [name] lisinopril (PRINIVIL,ZESTRIL) 10 MG tablet Take 10 mg by mouth daily.        . metoprolol (TOPROL-XL) 50 MG 24 hr tablet  Take 50 mg by mouth daily.        [name] omeprazole (PRILOSEC) 20 MG capsule Take 20 mg by mouth daily.        . potassium chloride SA (K-DUR,KLOR-CON) 20 MEQ tablet Take 20 mEq by mouth 2 (two) times daily.        [name] warfarin (COUMADIN) 2.5 MG tablet Take 2.5 mg by mouth daily. Take 2.5 mg everyday except on Wednesday. Take 3.75 mg on Wednesday.        Assessment: 77 y/o male patient admitted with syncope, on chronic coumadin for h/o afib and stroke. INR subtherapeutic, will give 1.5x home dose to increase INR.  Goal of Therapy:  INR 2-3   Plan:  Coumadin 4mg  po today, daily protime.  Verlene Mayer, PharmD, BCPS Pager [phone]  02/10/2011,1:51 PM

## 2011-02-10 NOTE — Progress Notes (Signed)
PROGRESS NOTE  [name] ZDG:[id number] DOB: May 29, 1932 DOA: 02/09/2011 PCP: Pearson Grippe, MD, MD Cardiologist: Lewayne Bunting, M.D.  Brief narrative: 76 year old man presented to the emergency department with a history of nausea, vomiting, altered mental status, lightheadedness and left-sided facial numbness. Also complained of chest pain. The daughter had noted that the patient had had an unsteady gait when the patient was walking from the car to his house. She assisted him into the house and into bed. He became confused and then unresponsive for approximately 10 minutes. Focal neurologic symptoms were denied. Emergency department physician's exam was notable for the patient being alert and oriented to person place and time. Motor strength was normal in the upper lower extremities bilaterally. Mood and affect and behavior appeared normal.  According to the admitting physician's documentation the patient reported chest pain on walking downhill. However in my questioning the patient denies this. He could not recollect the events clearly of yesterday. He denied chest pain and emergency department. No shortness of breath. He was admitted for syncope, chest pain rule out. Pacemaker/defibrillator was interrogated in the emergency department. Of note the patient just completed a colonoscopy the day prior to admission.  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   Admitted 12/13/2010. Final discharge diagnoses: Near syncope likely secondary to dehydration, orthostasis or vasovagal; left internal carotid artery occlusion. At that time and he was seen by cardiology and vascular surgery. Vascular surgery felt that there was no indication for revascularization in the occlusion was not likely the cause for syncope. Review of cardiology note is remarkable for a very similar presentation in comparison with this admission. Impression was syncope most likely orthostatic in nature. In regard to his cardiac issues, it was the opinion of Dr. Daleen Squibb in October of this year that the patient was a candidate for medical therapy only.  06/06/2010: 2-D echocardiogram: Impression: Mildly dilated left ventricle with severe systolic dysfunction, EF 25%. Wall motion abnormalities as described above.Images are suspicious fora small, likely chronic, calcified thrombus at the apex. Moderate diastolic dysfunction. The RV is normal in size with moderate systolic dysfunction.  Consultants:  Cardiology:  Procedures:  Pacemaker/ICD interrogation November 30: No ventricular tachycardia or ventricular fibrillation. No atrial lead. No underlying rhythm at VVI 30 beats per minute. Infrequent PVCs.  Interim History: Interim documentation reviewed. Noted be orthostatic. Subjective: Patient feels great. Ambulating without difficulty. No facial numbness or neurologic symptoms. No dizziness. Absolutely denies any recent chest pain.  Objective: Filed Vitals:   02/10/11 0534  BP: 129/57  Pulse: 60  Temp: 97.9 F (36.6 C)  Resp: 21   Blood pressure 129/57, pulse 60, temperature 97.9 F (36.6 C), temperature source Oral, resp. rate 21, SpO2 100.00%.  No intake or output data in the 24 hours ending 02/10/11 0806  Exam: General: Well-appearing. Cardiovascular: Regular rate and rhythm. No rub or gallop. No lower extremity  edema. Respiratory: Clear to auscultation bilaterally. No wheezes rales or rhonchi. Normal respiratory effort. Neurologic: Cranial nerves 2-12 are intact. Speech fluent and clear. Musculoskeletal: Tone and strength in  the upper lower extremity his is 5/5 and symmetric.  Data Reviewed: Basic Metabolic Panel:  Lab 02/09/11 4098  NA 140  K 4.1  CL 99  CO2 28  GLUCOSE 133*  BUN 28*  CREATININE 1.62*  CALCIUM 9.8  MG --  PHOS --   Liver Function Tests:  Lab 02/09/11 1813  AST 20  ALT 22  ALKPHOS 100  BILITOT 1.1  PROT 8.0  ALBUMIN 3.9   CBC:  Lab 02/09/11 1813  WBC 6.9  NEUTROABS 5.4  HGB 10.7*  HCT 31.4*  MCV 94.6  PLT 168   Cardiac Enzymes:  Lab 02/09/11 2255  CKTOTAL 69  CKMB 2.1  CKMBINDEX --  TROPONINI <0.30   Studies: Ct Head W Wo Contrast  02/09/2011  *RADIOLOGY REPORT*  Clinical Data: Altered mental status and history prostate cancer. Left side of face when none today.  CT HEAD WITHOUT AND WITH CONTRAST  Technique:  Contiguous axial images were obtained from the base of the skull through the vertex without and with intravenous contrast.  Contrast: 75mL OMNIPAQUE IOHEXOL 300 MG/ML IV SOLN  Comparison: Head CT 12/13/2010 and nuclear medicine bone scan 11/16/2010  Findings: There is diffuse cortical atrophy, stable.  Ventricles are stable and normal in size.  No evidence of acute intracranial abnormality.  Specifically, there is no hemorrhage, mass lesion, or evidence of acute infarction.  There is no midline shift or mass effect.  On postcontrast images, there is no abnormal area of enhancement.  The visualized paranasal sinuses, mastoid air cells, and middle ears are clear.  The skull is intact.  IMPRESSION:   Stable head CT.  Atrophy.  No acute intracranial abnormality, mass lesion, or abnormal enhancement.  Original Report Authenticated By: Britta Mccreedy, M.D.   Dg Chest Port 1 View  02/10/2011  *RADIOLOGY REPORT*  Clinical Data: Pacer lead check.  Irregular  EKG.  PORTABLE CHEST - 1 VIEW  Comparison: Chest radiograph performed 12/13/2010  Findings: A pacemaker/AICD is noted overlying the left chest wall, with leads ending overlying the right ventricle and coronary sinus. The position is unchanged from the prior study.  There is also an orphaned right-sided pacemaker lead, which also ends at the right ventricle.  Vascular congestion is noted; mildly increased interstitial markings are slightly more prominent than on the prior study but are likely chronic in nature.  Blunting of the left costophrenic angle appears stable.  No pneumothorax is seen.  The cardiomediastinal silhouette is borderline normal in size; the patient status post median sternotomy, with evidence of prior CABG. No acute osseous abnormalities are identified.  IMPRESSION:  1.  Pacemaker/AICD noted with leads ending overlying the right ventricle and coronary sinus.  The position of the leads is unchanged from the prior study.  Orphaned right-sided pacemaker lead also ends at the right ventricle. 2.  Vascular congestion noted; slightly more prominent interstitial markings are likely relatively chronic.  No significant edema seen.  Original Report Authenticated By: Tonia Ghent, M.D.    Scheduled Meds:   . allopurinol  300 mg Oral Daily  . digoxin  125 mcg Oral Daily  . insulin aspart  0-15 Units Subcutaneous TID WC  . insulin aspart  0-5 Units Subcutaneous QHS  . isosorbide mononitrate  30 mg Oral Daily  . metoprolol  50 mg Oral Daily  . pantoprazole  40 mg Oral Q1200  . potassium chloride SA  20 mEq Oral BID  . simvastatin  40 mg Oral Daily  . sodium chloride  3 mL Intravenous Q12H  . warfarin  2.5 mg Oral Daily   Continuous Infusions:    Assessment/Plan: 1. Syncope: Most likely vasovagal/orthostatic in nature. Patient is orthostatic this morning and his creatinine is elevated above recent discharge value. One day prior to admission he underwent colonoscopy. Taken together this  appears to be a benign etiology. There is somewhat conflicting history in the admission history and physical in the emergency room documentation. The patient denies chest pain now. Will rule out with cardiac enzymes. He has recently had a carotid ultrasound and has had a 2-D echocardiogram earlier this year. I do not see any reason to repeat these. His neurologic exam is nonfocal. The significance of the transient facial numbness he had is unclear but I doubt neurologic in nature. Digoxin level pending. 2. Altered mental status: Secondary to #1. Resolved. 3. Chest pain: Is not clear whether the patient actually had chest pain. He denies this. Complete rule out serial cardiac enzymes. Consider antiplatelet medication list the patient is on Coumadin would defer this to his cardiologist. 4. Left internal carotid artery occlusion (chronic): Per consultation with vascular surgery October of this year the recommendations were for medical management including statin use, antiplatelet therapy. 5. History of atrial fibrillation on warfarin: No PT/INR checked. Will obtain today. Continue warfarin per pharmacy. 6. History of systolic/diastolic congestive heart failure: This appears to be well compensated at this point. Continue digoxin, metoprolol. Lasix on hold secondary to above. 7. Diabetes mellitus: Appears to be stable at this point. 8. Oxygen-dependent COPD/2 L per minute nasal cannula: Appears stable. 9. Chronic normocytic anemia: Appears stable.  Code Status: Full.  Disposition Plan: Continue IV fluids. Anticipate discharge home tomorrow December 2.   Brendia Sacks, MD  [facility] Pager [phone] 02/10/2011, 8:06 AM    LOS: 1 day

## 2011-02-10 NOTE — ED Notes (Signed)
Pt on admission. No c/o's. Got up and transferred to bed from stretcher w/o sob.

## 2011-02-10 NOTE — H&P (Signed)
PCP:  Pearson Grippe  Chief Complaint:  Syncope  HPI: [name] is a 77 y.o. male   has a past medical history of Diabetes mellitus; Hypertension; CHF (congestive heart failure); CVA (cerebral vascular accident); MI (myocardial infarction); Pacemaker; S/P CABG (coronary artery bypass graft); Gout; Nephrolithiasis; Lung cancer; and Colon cancer.   Presented with  Patient this morning had some chest pain while walking down the hill. He had not been feeling well all afternoon. He went out with his daughter, when they were returning he felt bad and she walked him to the house he cannot recollect what happened next. Emergency physician obtained a history ;  patient's daughter told him that he was staggering and he finally laid down on his bed his eyes rolled back in his head and he became unconscious. the daughter is currently not in the room. The patient himself does not recollect any of this. He is currently chest pain-free. Currently no shortness of breath and feeling back to baseline. He did state she was the thirsty prior to arrival. Otherwise no other complaints.  Of note patient has [employer] AICD in place, this was interrogated and per report no abnormalities were found. Review of Systems:    Pertinent Positives: Chest pain exertional, syncope, fatigue  Pertinent Negatives: No shortness of breath, no nausea no vomiting, no diaphoresis, Otherwise ROS are negative except for above, 10 systems were reviewed  Past Medical History: Past Medical History  Diagnosis Date  . Diabetes mellitus   . Hypertension   . CHF (congestive heart failure)   . CVA (cerebral vascular accident)   . MI (myocardial infarction)   . Pacemaker   . S/P CABG (coronary artery bypass graft)   . Gout   . Nephrolithiasis   . Lung cancer   . Colon cancer    Past Surgical History  Procedure Date  . Colonoscopy   . Upper endoscpopy   . Icd insertion     status post BIV  . Coronary artery bypass graft       Medications: Prior to Admission medications   Medication Sig Start Date End Date Taking? Authorizing Provider  allopurinol (ZYLOPRIM) 300 MG tablet Take 300 mg by mouth daily.     Yes Historical Provider, MD  atorvastatin (LIPITOR) 20 MG tablet Take 20 mg by mouth daily.     Yes Historical Provider, MD  CALCIUM-VITAMIN D PO Take 1 tablet by mouth daily.     Yes Historical Provider, MD  Cyanocobalamin (VITAMIN B-12 PO) Take 1 tablet by mouth daily.     Yes Historical Provider, MD  digoxin (LANOXIN) 0.125 MG tablet Take 125 mcg by mouth daily.     Yes Historical Provider, MD  enoxaparin (LOVENOX) 40 MG/0.4ML SOLN Inject 40 mg into the skin daily.     Yes Historical Provider, MD  fish oil-omega-3 fatty acids 1000 MG capsule Take 2 g by mouth daily.     Yes Historical Provider, MD  furosemide (LASIX) 40 MG tablet Take 40 mg by mouth daily.     Yes Historical Provider, MD  isosorbide mononitrate (IMDUR) 30 MG 24 hr tablet Take 30 mg by mouth daily.     Yes Historical Provider, MD  lisinopril (PRINIVIL,ZESTRIL) 10 MG tablet Take 10 mg by mouth daily.     Yes Historical Provider, MD  metoprolol (TOPROL-XL) 50 MG 24 hr tablet Take 50 mg by mouth daily.     Yes Historical Provider, MD  omeprazole (PRILOSEC) 20 MG capsule  Take 20 mg by mouth daily.     Yes Historical Provider, MD  potassium chloride SA (K-DUR,KLOR-CON) 20 MEQ tablet Take 20 mEq by mouth 2 (two) times daily.     Yes Historical Provider, MD  warfarin (COUMADIN) 2.5 MG tablet Take 2.5 mg by mouth daily. Take 2.5 mg everyday except on Wednesday. Take 3.75 mg on Wednesday.   Yes Historical Provider, MD    Allergies:   Allergies  Allergen Reactions  . Aspirin     Social History:  reports that he has never smoked. He does not have any smokeless tobacco history on file. He reports that he does not drink alcohol.   Family History: family history includes Alzheimer's disease (age of onset:80) in his father; Cancer (age of onset:87)  in his mother; Diabetes in his brothers; and Diabetes (age of onset:66) in his sister.    Physical Exam: Patient Vitals for the past 24 hrs:  BP Temp Temp src Pulse Resp SpO2  02/10/11 0000 108/56 mmHg - - 61  17  100 %  02/09/11 2345 - - - 59  17  100 %  02/09/11 2330 - - - 60  13  100 %  02/09/11 2315 - - - 62  22  100 %  02/09/11 2300 - - - - 17  -  02/09/11 2245 - - - 99  21  84 %  02/09/11 2230 - - - 61  15  99 %  02/09/11 2215 - - - 63  17  100 %  02/09/11 2200 - - - 59  20  100 %  02/09/11 2145 127/61 mmHg 98.5 F (36.9 C) Oral 62  - 100 %  02/09/11 2145 - - - 63  20  100 %  02/09/11 2030 122/59 mmHg - - 62  18  100 %  02/09/11 2015 119/58 mmHg - - 60  20  100 %  02/09/11 2000 114/61 mmHg - - 60  13  100 %  02/09/11 1945 118/52 mmHg - - 61  21  100 %  02/09/11 1727 116/60 mmHg 97.5 F (36.4 C) Oral 59  17  100 %  02/09/11 1601 111/43 mmHg 98 F (36.7 C) Oral 61  18  100 %     Alert and Oriented in No Acute distress Mucous Membranes and Skin: Dry mucous membranes decreased skin to Head Non traumatic neck supple Heart: Slow but regular, possible systolic murmur appreciated Lungs: Distant bilaterally but no crackles no wheezes Abdomen: Soft nontender nondistended Lower extremities: Without clubbing cyanosis or edema Neurologically Grossly intact Skin clean Dry and intact no rash body mass index is unknown because there is no height or weight on file.   Labs on Admission:   [facility] 02/09/11 1813  NA 140  K 4.1  CL 99  CO2 28  GLUCOSE 133*  BUN 28*  CREATININE 1.62*  CALCIUM 9.8  MG --  PHOS --    Basename 02/09/11 1813  AST 20  ALT 22  ALKPHOS 100  BILITOT 1.1  PROT 8.0  ALBUMIN 3.9   No results found for this basename: LIPASE:2,AMYLASE:2 in the last 72 hours  Basename 02/09/11 1813  WBC 6.9  NEUTROABS 5.4  HGB 10.7*  HCT 31.4*  MCV 94.6  PLT 168    Basename 02/09/11 2255  CKTOTAL 69  CKMB 2.1  CKMBINDEX --  TROPONINI <0.30   No  results found for this basename: TSH,T4TOTAL,FREET3,T3FREE,THYROIDAB in the last 72 hours No results found for  this basename: VITAMINB12:2,FOLATE:2,FERRITIN:2,TIBC:2,IRON:2,RETICCTPCT:2 in the last 72 hours Lab Results  Component Value Date   HGBA1C  Value: 5.4 (NOTE) The ADA recommends the following therapeutic goal for glycemic control related to Hgb A1c measurement: Goal of therapy: <6.5 Hgb A1c  Reference: American Diabetes Association: Clinical Practice Recommendations 2010, Diabetes Care, 2010, 33: (Suppl  1). 07/22/2008    The CrCl is unknown because both a height and weight (above a minimum accepted value) are required for this calculation. ABG    Component Value Date/Time   HCO3 25.7* 01/26/2007 2137   TCO2 26 10/29/2008 1437     Lab Results  Component Value Date   DDIMER  Value: 0.45        AT THE INHOUSE ESTABLISHED CUTOFF VALUE OF 0.48 ug/mL FEU, THIS ASSAY HAS BEEN DOCUMENTED IN THE LITERATURE TO HAVE A SENSITIVITY AND NEGATIVE PREDICTIVE VALUE OF AT LEAST 98 TO 99%.  THE TEST RESULT SHOULD BE CORRELATED WITH AN ASSESSMENT OF THE CLINICAL PROBABILITY OF DVT / VTE. 01/19/2010     Other results: ED ECG REPORT  Paced    Blood Culture    Component Value Date/Time   SDES URINE, RANDOM 12/14/2010 0108   SPECREQUEST NONE 12/14/2010 0108   CULT INSIGNIFICANT GROWTH 12/14/2010 0108   REPTSTATUS 12/15/2010 FINAL 12/14/2010 0108       Radiological Exams on Admission: Ct Head W Wo Contrast  02/09/2011  *RADIOLOGY REPORT*  Clinical Data: Altered mental status and history prostate cancer. Left side of face when none today.  CT HEAD WITHOUT AND WITH CONTRAST  Technique:  Contiguous axial images were obtained from the base of the skull through the vertex without and with intravenous contrast.  Contrast: 75mL OMNIPAQUE IOHEXOL 300 MG/ML IV SOLN  Comparison: Head CT 12/13/2010 and nuclear medicine bone scan 11/16/2010  Findings: There is diffuse cortical atrophy, stable.  Ventricles are  stable and normal in size.  No evidence of acute intracranial abnormality.  Specifically, there is no hemorrhage, mass lesion, or evidence of acute infarction.  There is no midline shift or mass effect.  On postcontrast images, there is no abnormal area of enhancement.  The visualized paranasal sinuses, mastoid air cells, and middle ears are clear.  The skull is intact.  IMPRESSION:   Stable head CT.  Atrophy.  No acute intracranial abnormality, mass lesion, or abnormal enhancement.  Original Report Authenticated By: Britta Mccreedy, M.D.   Dg Chest Port 1 View  02/10/2011  *RADIOLOGY REPORT*  Clinical Data: Pacer lead check.  Irregular EKG.  PORTABLE CHEST - 1 VIEW  Comparison: Chest radiograph performed 12/13/2010  Findings: A pacemaker/AICD is noted overlying the left chest wall, with leads ending overlying the right ventricle and coronary sinus. The position is unchanged from the prior study.  There is also an orphaned right-sided pacemaker lead, which also ends at the right ventricle.  Vascular congestion is noted; mildly increased interstitial markings are slightly more prominent than on the prior study but are likely chronic in nature.  Blunting of the left costophrenic angle appears stable.  No pneumothorax is seen.  The cardiomediastinal silhouette is borderline normal in size; the patient status post median sternotomy, with evidence of prior CABG. No acute osseous abnormalities are identified.  IMPRESSION:  1.  Pacemaker/AICD noted with leads ending overlying the right ventricle and coronary sinus.  The position of the leads is unchanged from the prior study.  Orphaned right-sided pacemaker lead also ends at the right ventricle. 2.  Vascular congestion noted; slightly  more prominent interstitial markings are likely relatively chronic.  No significant edema seen.  Original Report Authenticated By: Tonia Ghent, M.D.    Assessment/Plan  Present on Admission:  .Syncope and collapse - etiology not quite  clear at this point, from a cardiac standpoint Will admit on telemetry cycle cardiac enzymes S. EKGs recheck echocardiogram and recommend cardiology consult in the a.m. patient been seen by the bowel in the past. We'll also check carotid Dopplers. Patient is not in a candidate for MRI given pacemaker. He has not had any localized neurological complaints. .Chest pain on exertion - see above we'll cycle cardiac markers consider cardiology consult  .Dehydration - he appears to slightly dehydrated we'll hold off on Lasix for now, this will probably be restarted as soon as possible  .CARDIOMYOPATHY, ISCHEMIC - check BNP repeat echogram consider cardiology consult  .CHF - currently appears to be fluid down  .Atrial fibrillation - currently paced Will continue digoxin check digoxin level continue Coumadin and continue metoprolol currently rate controlled    Prophylaxis: Protonix and  Coumadin  CODE STATUS: Full code as per his wishes   Tamar Miano 02/10/2011, 1:32 AM

## 2011-02-10 NOTE — ED Notes (Signed)
[facility]  HERE TO CHECK PT'S PACEMAKER.

## 2011-02-11 DIAGNOSIS — N179 Acute kidney failure, unspecified: Secondary | ICD-10-CM

## 2011-02-11 LAB — CBC
HCT: 29 % — ABNORMAL LOW (ref 39.0–52.0)
Hemoglobin: 9.8 g/dL — ABNORMAL LOW (ref 13.0–17.0)
MCH: 32 pg (ref 26.0–34.0)
MCHC: 33.8 g/dL (ref 30.0–36.0)
RDW: 15.2 % (ref 11.5–15.5)

## 2011-02-11 LAB — BASIC METABOLIC PANEL
BUN: 19 mg/dL (ref 6–23)
Calcium: 9.5 mg/dL (ref 8.4–10.5)
Creatinine, Ser: 1.21 mg/dL (ref 0.50–1.35)
GFR calc non Af Amer: 56 mL/min — ABNORMAL LOW (ref >90)
Glucose, Bld: 104 mg/dL — ABNORMAL HIGH (ref 70–99)

## 2011-02-11 LAB — GLUCOSE, CAPILLARY: Glucose-Capillary: 111 mg/dL — ABNORMAL HIGH (ref 70–99)

## 2011-02-11 MED ORDER — FUROSEMIDE 20 MG PO TABS: 20.0000 mg | ORAL_TABLET | Freq: Every day | ORAL | Status: DC

## 2011-02-11 MED ORDER — WARFARIN SODIUM 4 MG PO TABS
4.0000 mg | ORAL_TABLET | Freq: Once | ORAL | Status: DC
  Filled 2011-02-11: qty 1

## 2011-02-11 NOTE — Progress Notes (Signed)
ANTICOAGULATION CONSULT NOTE - Follow Up Consult  Pharmacy Consult for Coumadin Indication: atrial fibrillation  Allergies  Allergen Reactions  . Aspirin     Patient Measurements:     Vital Signs: Temp: 97.7 F (36.5 C) (12/02 0500) Temp src: Oral (12/02 0500) BP: 124/57 mmHg (12/02 0500) Pulse Rate: 86  (12/02 0500)  Labs:  Basename 02/11/11 0500 02/10/11 1750 02/10/11 1100 02/09/11 2255 02/09/11 1813  HGB 9.8* -- -- -- 10.7*  HCT 29.0* -- -- -- 31.4*  PLT 120* -- -- -- 168  APTT -- -- 37 -- --  LABPROT 14.5 -- 15.6* -- --  INR 1.11 -- 1.21 -- --  HEPARINUNFRC -- -- -- -- --  CREATININE 1.21 -- -- -- 1.62*  CKTOTAL -- 60 54 69 --  CKMB -- 2.1 2.3 2.1 --  TROPONINI -- <0.30 <0.30 <0.30 --   The CrCl is unknown because both a height and weight (above a minimum accepted value) are required for this calculation.   Medications:  Scheduled:    . allopurinol  300 mg Oral Daily  . digoxin  125 mcg Oral Daily  . insulin aspart  0-15 Units Subcutaneous TID WC  . isosorbide mononitrate  30 mg Oral Daily  . metoprolol  50 mg Oral Daily  . pantoprazole  40 mg Oral Q1200  . potassium chloride SA  20 mEq Oral BID  . simvastatin  40 mg Oral q1800  . sodium chloride  3 mL Intravenous Q12H  . warfarin  4 mg Oral ONCE-1800    Assessment: 77 y/o male patient on chronic coumadin for afib, INR subtherapeutic. No bleeding reported.  Goal of Therapy:  INR 2-3   Plan:  Coumadin 4mg  today and f/u in am.  Verlene Mayer, PharmD, BCPS Pager [phone]  02/11/2011,2:57 PM

## 2011-02-11 NOTE — Discharge Summary (Addendum)
Physician Discharge Summary  [name] JXB:[id number] DOB: 1932-07-05 DOA: 02/09/2011  PCP: [name] Grippe, MD, MD  Admit date: 02/09/2011 Discharge date: 02/11/2011  Discharge Diagnoses:  1. Syncope/orthostatic hypotension 2. Acute renal failure secondary to diuretic therapy 3. History of atrial fibrillation 4. History of systolic/diastolic congestive heart failure, compensated  Discharge Condition: Improved  Disposition: Home or Self Care  History of present illness:  77 year old man presented to the emergency department with a history of nausea, vomiting, altered mental status, lightheadedness and left-sided facial numbness. Also complained of chest pain. The patient in standing talking with a friend when he began to feel nauseous and unwell. His daughter assisted him into the house and into bed. He became confused and then unresponsive for approximately 10 minutes. There is some question perhaps shin numbness. Emergency department physician's exam was notable for the patient being alert and oriented to person place and time. Motor strength was normal in the upper lower extremities bilaterally. Mood and affect and behavior appeared normal.  He was admitted for syncope, chest pain rule out. Pacemaker/defibrillator was interrogated in the emergency department. Of note the patient just completed a colonoscopy the day prior to admission.  Hospital Course:  The patient was admitted for monitoring. Telemetry was unremarkable showing a paced rhythm. Cardiac enzymes were negative. The patient had no further symptoms neurologic or otherwise. He is stable for discharge.  1. Syncope: Most likely vasovagal/orthostatic in nature. Patient was noted to have mild acute renal failure and orthostasis on admission. Of note he had a similar presentation in October for syncope as well and at that time was also in acute renal failure. This appears to be benign in etiology.  Additionally the patient just  completed a colonoscopy one day prior to admission further supports orthostasis and acute renal failure as the etiology of the syncope. He has recently had a carotid ultrasound and has had a 2-D echocardiogram earlier this year. I do not see any reason to repeat these. His neurologic exam is nonfocal. The significance of the transient facial numbness he had is unclear but I doubt neurologic in nature. He is not candidate for an MRI secondary to his history of pacemaker.  2. Acute renal failure: Resolved with withholding of Lasix and lisinopril. 3. Chest pain: Is not clear whether the patient actually had chest pain. He denies this. He ruled out with serial cardiac enzymes. No further evaluation at this time.  4. Left internal carotid artery occlusion (chronic): Per consultation with vascular surgery October of this year the recommendations were for medical management including statin use.  5. History of atrial fibrillation on warfarin: Continue to follow Dr. Elmyra Ricks office for Coumadin dosing.    6. History of systolic/diastolic congestive heart failure: This appears to be well compensated at this point. Continue digoxin, metoprolol. Decrease Lasix dosing on discharge.  7. Diabetes mellitus: Appears to be stable at this point.  8. Oxygen-dependent COPD/2 L per minute nasal cannula: Appears stable.  9. Chronic normocytic anemia: Appears stable.  I had along discussion with his daughter today by phone discussing my clinical impression and recommendations.  Consultants:  None.  Procedures:  Pacemaker/ICD interrogation November 30: No ventricular tachycardia or ventricular fibrillation. No atrial lead. No underlying rhythm at VVI 30 beats per minute. Infrequent PVCs.  Discharge Instructions  Discharge Orders    Future Appointments: Provider: Department: Dept Phone: Center:   03/20/2011 7:30 AM [facility] [phone] None   03/20/2011 8:00 AM Maryln Gottron, MD  [facility] [phone] None   03/22/2011 10:05 AM Amber Caryl Bis, RN [facility] [phone] [facility]     Future Orders Please Complete By Expires   Diet - low sodium heart healthy      Discharge instructions      Comments:   Make positional changes slowly.   Increase activity slowly        Current Discharge Medication List    CONTINUE these medications which have CHANGED   Details  furosemide (LASIX) 20 MG tablet Take 1 tablet (20 mg total) by mouth daily. Qty: 30 tablet, Refills: 0   Associated Diagnoses: Congestive heart failure, unspecified      CONTINUE these medications which have NOT CHANGED   Details  allopurinol (ZYLOPRIM) 300 MG tablet Take 300 mg by mouth daily.      atorvastatin (LIPITOR) 20 MG tablet Take 20 mg by mouth daily.      CALCIUM-VITAMIN D PO Take 1 tablet by mouth daily.      Cyanocobalamin (VITAMIN B-12 PO) Take 1 tablet by mouth daily.      digoxin (LANOXIN) 0.125 MG tablet Take 125 mcg by mouth daily.      fish oil-omega-3 fatty acids 1000 MG capsule Take 2 g by mouth daily.      isosorbide mononitrate (IMDUR) 30 MG 24 hr tablet Take 30 mg by mouth daily.      lisinopril (PRINIVIL,ZESTRIL) 10 MG tablet Take 10 mg by mouth daily.      metoprolol (TOPROL-XL) 50 MG 24 hr tablet Take 50 mg by mouth daily.      omeprazole (PRILOSEC) 20 MG capsule Take 20 mg by mouth daily.      potassium chloride SA (K-DUR,KLOR-CON) 20 MEQ tablet Take 20 mEq by mouth 2 (two) times daily.      warfarin (COUMADIN) 2.5 MG tablet Take 2.5 mg by mouth daily. Take 2.5 mg everyday except on Wednesday. Take 3.75 mg on Wednesday.      STOP taking these medications     enoxaparin (LOVENOX) 40 MG/0.4ML SOLN        Follow-up Information    Follow up with [name] Grippe, MD. (For PT/INR check.)    Contact information:   [address], [address] [facility] [place] [address] [phone]       Follow up with Lewayne Bunting, MD. (As needed)    Contact information:   [address] [place] [address] [phone]           The results of significant diagnostics from this hospitalization (including imaging, microbiology, ancillary and laboratory) are listed below for reference.    Significant Diagnostic Studies: Ct Head W Wo Contrast  02/09/2011  *RADIOLOGY REPORT*  Clinical Data: Altered mental status and history prostate cancer. Left side of face when none today.  CT HEAD WITHOUT AND WITH CONTRAST  Technique:  Contiguous axial images were obtained from the base of the skull through the vertex without and with intravenous contrast.  Contrast: 75mL OMNIPAQUE IOHEXOL 300 MG/ML IV SOLN  Comparison: Head CT 12/13/2010 and nuclear medicine bone scan 11/16/2010  Findings: There is diffuse cortical atrophy, stable.  Ventricles are stable and normal in size.  No evidence of acute intracranial abnormality.  Specifically, there is no hemorrhage, mass lesion, or evidence of acute infarction.  There is no midline shift or mass effect.  On postcontrast images, there is no abnormal area of enhancement.  The visualized  paranasal sinuses, mastoid air cells, and middle ears are clear.  The skull is intact.  IMPRESSION:   Stable head CT.  Atrophy.  No acute intracranial abnormality, mass lesion, or abnormal enhancement.  Original Report Authenticated By: Britta Mccreedy, M.D.   Dg Chest Port 1 View  02/10/2011  *RADIOLOGY REPORT*  Clinical Data: Pacer lead check.  Irregular EKG.  PORTABLE CHEST - 1 VIEW  Comparison: Chest radiograph performed 12/13/2010  Findings: A pacemaker/AICD is noted overlying the left chest wall, with leads ending overlying the right ventricle and coronary sinus. The position is unchanged from the prior study.  There is also an orphaned right-sided pacemaker lead, which also ends at the right ventricle.  Vascular congestion is noted; mildly increased  interstitial markings are slightly more prominent than on the prior study but are likely chronic in nature.  Blunting of the left costophrenic angle appears stable.  No pneumothorax is seen.  The cardiomediastinal silhouette is borderline normal in size; the patient status post median sternotomy, with evidence of prior CABG. No acute osseous abnormalities are identified.  IMPRESSION:  1.  Pacemaker/AICD noted with leads ending overlying the right ventricle and coronary sinus.  The position of the leads is unchanged from the prior study.  Orphaned right-sided pacemaker lead also ends at the right ventricle. 2.  Vascular congestion noted; slightly more prominent interstitial markings are likely relatively chronic.  No significant edema seen.  Original Report Authenticated By: Tonia Ghent, M.D.   Labs: Basic Metabolic Panel:  Lab 02/11/11 9147 02/10/11 1100 02/09/11 1813  NA 134* -- 140  K 3.7 -- 4.1  CL 97 -- 99  CO2 27 -- 28  GLUCOSE 104* -- 133*  BUN 19 -- 28*  CREATININE 1.21 -- 1.62*  CALCIUM 9.5 -- 9.8  MG -- 1.7 --  PHOS -- -- --   Liver Function Tests:  Lab 02/09/11 1813  AST 20  ALT 22  ALKPHOS 100  BILITOT 1.1  PROT 8.0  ALBUMIN 3.9   CBC:  Lab 02/11/11 0500 02/09/11 1813  WBC 4.9 6.9  NEUTROABS -- 5.4  HGB 9.8* 10.7*  HCT 29.0* 31.4*  MCV 94.8 94.6  PLT 120* 168   Cardiac Enzymes:  Lab 02/10/11 1750 02/10/11 1100 02/09/11 2255  CKTOTAL 60 54 69  CKMB 2.1 2.3 2.1  CKMBINDEX -- -- --  TROPONINI <0.30 <0.30 <0.30   CBG:  Lab 02/11/11 1144 02/11/11 0736 02/10/11 2044 02/10/11 1633 02/10/11 1152  GLUCAP 110* 96 111* 135* 127*    Time coordinating discharge: 35 minutes.  Signed:  Brendia Sacks, MD  [facility] 02/11/2011, 4:29 PM

## 2011-02-11 NOTE — Progress Notes (Signed)
PROGRESS NOTE  [name] EAV:[id number] DOB: 1932-11-29 DOA: 02/09/2011 PCP: Pearson Grippe, MD, MD Cardiologist: Lewayne Bunting, M.D.  Brief narrative: 77 year old man presented to the emergency department with a history of nausea, vomiting, altered mental status, lightheadedness and left-sided facial numbness. Also complained of chest pain. The daughter had noted that the patient had had an unsteady gait when the patient was walking from the car to his house. She assisted him into the house and into bed. He became confused and then unresponsive for approximately 10 minutes. Focal neurologic symptoms were denied. Emergency department physician's exam was notable for the patient being alert and oriented to person place and time. Motor strength was normal in the upper lower extremities bilaterally. Mood and affect and behavior appeared normal.  According to the admitting physician's documentation the patient reported chest pain on walking downhill. However in my questioning the patient denies this. He could not recollect the events clearly of yesterday. He denied chest pain in the emergency department. No shortness of breath. He was admitted for syncope, chest pain rule out. Pacemaker/defibrillator was interrogated in the emergency department. Of note the patient just completed a colonoscopy the day prior to admission.  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   Admitted 12/13/2010. Final discharge diagnoses: Near syncope likely secondary to dehydration, orthostasis or vasovagal; left internal carotid artery occlusion. At that time and he was seen by cardiology and vascular surgery. Vascular surgery felt that there was no indication for revascularization in the occlusion was not likely the cause for syncope. Review of cardiology note is remarkable for a very similar presentation in comparison with this admission. Impression was syncope most likely orthostatic in nature. In regard to his cardiac issues, it was the opinion of Dr. Daleen Squibb in October of this year that the patient was a candidate for medical therapy only.  06/06/2010: 2-D echocardiogram: Impression: Mildly dilated left ventricle with severe systolic dysfunction, EF 25%. Wall motion abnormalities as described above.Images are suspicious fora small, likely chronic, calcified thrombus at the apex. Moderate diastolic dysfunction. The RV is normal in size with moderate systolic dysfunction.  Consultants:  Cardiology:  Procedures:  Pacemaker/ICD interrogation November 30: No ventricular tachycardia or ventricular fibrillation. No atrial lead. No underlying rhythm at VVI 30 beats per minute. Infrequent PVCs.  Interim History: Interim documentation reviewed.  Subjective: Patient feels great. He is ambulating in the hall without difficulty. No facial numbness or focal neurologic deficits. Ready to go home.  Objective: Filed Vitals:   02/10/11 0934 02/10/11 1400 02/10/11 2100 02/11/11 0500  BP: 106/68 116/61 126/66 124/57  Pulse: 63 61 62 86  Temp: 98 F (36.7 C) 97.9 F (36.6 C) 97.8 F (36.6 C) 97.7 F (36.5 C)  TempSrc: Oral Oral Oral Oral  Resp: 20 18 18 18   SpO2: 98% 100% 100% 98%   Exam: General: Well-appearing. Cardiovascular: Regular rate and rhythm. No rub or gallop. No lower extremity edema. Respiratory: Clear  to auscultation bilaterally. No wheezes rales or rhonchi. Normal respiratory effort. Neurologic: Cranial nerves 2-12 are intact. Speech  fluent and clear. Musculoskeletal: Tone and strength in the upper lower extremity his is 5/5 and symmetric.  Data Reviewed: Basic Metabolic Panel:  Lab 02/11/11 1610 02/10/11 1100 02/09/11 1813  NA 134* -- 140  K 3.7 -- 4.1  CL 97 -- 99  CO2 27 -- 28  GLUCOSE 104* -- 133*  BUN 19 -- 28*  CREATININE 1.21 -- 1.62*  CALCIUM 9.5 -- 9.8  MG -- 1.7 --  PHOS -- -- --   Liver Function Tests:  Lab 02/09/11 1813  AST 20  ALT 22  ALKPHOS 100  BILITOT 1.1  PROT 8.0  ALBUMIN 3.9   CBC:  Lab 02/11/11 0500 02/09/11 1813  WBC 4.9 6.9  NEUTROABS -- 5.4  HGB 9.8* 10.7*  HCT 29.0* 31.4*  MCV 94.8 94.6  PLT 120* 168   Cardiac Enzymes:  Lab 02/10/11 1750 02/10/11 1100 02/09/11 2255  CKTOTAL 60 54 69  CKMB 2.1 2.3 2.1  CKMBINDEX -- -- --  TROPONINI <0.30 <0.30 <0.30   Studies: Ct Head W Wo Contrast  02/09/2011  *RADIOLOGY REPORT*  Clinical Data: Altered mental status and history prostate cancer. Left side of face when none today.  CT HEAD WITHOUT AND WITH CONTRAST  Technique:  Contiguous axial images were obtained from the base of the skull through the vertex without and with intravenous contrast.  Contrast: 75mL OMNIPAQUE IOHEXOL 300 MG/ML IV SOLN  Comparison: Head CT 12/13/2010 and nuclear medicine bone scan 11/16/2010  Findings: There is diffuse cortical atrophy, stable.  Ventricles are stable and normal in size.  No evidence of acute intracranial abnormality.  Specifically, there is no hemorrhage, mass lesion, or evidence of acute infarction.  There is no midline shift or mass effect.  On postcontrast images, there is no abnormal area of enhancement.  The visualized paranasal sinuses, mastoid air cells, and middle ears are clear.  The skull is intact.  IMPRESSION:   Stable head CT.  Atrophy.  No acute intracranial abnormality, mass lesion, or abnormal  enhancement.  Original Report Authenticated By: Britta Mccreedy, M.D.   Dg Chest Port 1 View  02/10/2011  *RADIOLOGY REPORT*  Clinical Data: Pacer lead check.  Irregular EKG.  PORTABLE CHEST - 1 VIEW  Comparison: Chest radiograph performed 12/13/2010  Findings: A pacemaker/AICD is noted overlying the left chest wall, with leads ending overlying the right ventricle and coronary sinus. The position is unchanged from the prior study.  There is also an orphaned right-sided pacemaker lead, which also ends at the right ventricle.  Vascular congestion is noted; mildly increased interstitial markings are slightly more prominent than on the prior study but are likely chronic in nature.  Blunting of the left costophrenic angle appears stable.  No pneumothorax is seen.  The cardiomediastinal silhouette is borderline normal in size; the patient status post median sternotomy, with evidence of prior CABG. No acute osseous abnormalities are identified.  IMPRESSION:  1.  Pacemaker/AICD noted with leads ending overlying the right ventricle and coronary sinus.  The position of the leads is unchanged from the prior study.  Orphaned right-sided pacemaker lead also ends at the right ventricle. 2.  Vascular congestion noted; slightly more prominent interstitial markings are likely relatively chronic.  No significant edema seen.  Original Report Authenticated By: Tonia Ghent, M.D.   Scheduled Meds:    . allopurinol  300 mg Oral Daily  . digoxin  125 mcg Oral Daily  . insulin aspart  0-15 Units Subcutaneous TID WC  . isosorbide mononitrate  30 mg Oral Daily  . metoprolol  50 mg Oral Daily  . pantoprazole  40 mg Oral Q1200  . potassium chloride SA  20 mEq Oral BID  . simvastatin  40 mg Oral q1800  . sodium chloride  3 mL Intravenous Q12H  . warfarin  4 mg Oral ONCE-1800  . warfarin  4 mg Oral ONCE-1800   Continuous Infusions:    . sodium chloride Stopped (02/10/11 1030)     Assessment/Plan: 1. Syncope: Most likely  vasovagal/orthostatic in nature. Patient was noted to have mild acute renal failure and orthostasis on admission. Of note he had a similar presentation in October for syncope as well and at that time was also in acute renal failure. This appears to be a benign etiology.  He has recently had a carotid ultrasound and has had a 2-D echocardiogram earlier this year. I do not see any reason to repeat these. His neurologic exam is nonfocal. The significance of the transient facial numbness he had is unclear but I doubt neurologic in nature. He is not candidate for an MRI secondary to his history of pacemaker. 2. Altered mental status: Secondary to #1. Resolved. 3. Chest pain: Is not clear whether the patient actually had chest pain. He denies this. He ruled out with serial cardiac enzymes. Consider antiplatelet medication but as the patient is on Coumadin would defer this to his cardiologist. 4. Left internal carotid artery occlusion (chronic): Per consultation with vascular surgery October of this year the recommendations were for medical management including statin use. 5. History of atrial fibrillation on warfarin: Continue to follow Dr. Elmyra Ricks office for Coumadin dosing.  6. History of systolic/diastolic congestive heart failure: This appears to be well compensated at this point. Continue digoxin, metoprolol. Decrease Lasix dosing on discharge. 7. Diabetes mellitus: Appears to be stable at this point. 8. Oxygen-dependent COPD/2 L per minute nasal cannula: Appears stable. 9. Chronic normocytic anemia: Appears stable.  Discussed the above with his daughter by telephone today. All questions answered to her apparent satisfaction.  Code Status: Full.  Disposition Plan: Home today.   Brendia Sacks, MD  [facility] Pager [phone] 02/11/2011, 3:04 PM    LOS: 2 days

## 2011-02-12 NOTE — Progress Notes (Signed)
02/12/2011 Hayslee Casebolt [facility] Case Management Note [phone]       Utilization review completed.  

## 2011-02-13 ENCOUNTER — Inpatient Hospital Stay (HOSPITAL_COMMUNITY)
Admission: EM | Admit: 2011-02-13 | Discharge: 2011-02-22 | DRG: 389 | Disposition: A | Discharge status: Home / Self Care | Payer: PRIVATE HEALTH INSURANCE | Attending: Family Medicine | Admitting: Family Medicine

## 2011-02-13 ENCOUNTER — Encounter (HOSPITAL_COMMUNITY): Payer: Self-pay | Admitting: Emergency Medicine

## 2011-02-13 DIAGNOSIS — D62 Acute posthemorrhagic anemia: Secondary | ICD-10-CM | POA: Diagnosis present

## 2011-02-13 DIAGNOSIS — K859 Acute pancreatitis without necrosis or infection, unspecified: Secondary | ICD-10-CM

## 2011-02-13 DIAGNOSIS — I252 Old myocardial infarction: Secondary | ICD-10-CM

## 2011-02-13 DIAGNOSIS — Z7901 Long term (current) use of anticoagulants: Secondary | ICD-10-CM

## 2011-02-13 DIAGNOSIS — Z85038 Personal history of other malignant neoplasm of large intestine: Secondary | ICD-10-CM

## 2011-02-13 DIAGNOSIS — I2510 Atherosclerotic heart disease of native coronary artery without angina pectoris: Secondary | ICD-10-CM | POA: Diagnosis present

## 2011-02-13 DIAGNOSIS — K921 Melena: Secondary | ICD-10-CM | POA: Diagnosis not present

## 2011-02-13 DIAGNOSIS — D696 Thrombocytopenia, unspecified: Secondary | ICD-10-CM | POA: Diagnosis not present

## 2011-02-13 DIAGNOSIS — N390 Urinary tract infection, site not specified: Secondary | ICD-10-CM | POA: Diagnosis present

## 2011-02-13 DIAGNOSIS — Z8673 Personal history of transient ischemic attack (TIA), and cerebral infarction without residual deficits: Secondary | ICD-10-CM

## 2011-02-13 DIAGNOSIS — N189 Chronic kidney disease, unspecified: Secondary | ICD-10-CM | POA: Diagnosis present

## 2011-02-13 DIAGNOSIS — N179 Acute kidney failure, unspecified: Secondary | ICD-10-CM | POA: Diagnosis present

## 2011-02-13 DIAGNOSIS — Z951 Presence of aortocoronary bypass graft: Secondary | ICD-10-CM

## 2011-02-13 DIAGNOSIS — I4891 Unspecified atrial fibrillation: Secondary | ICD-10-CM

## 2011-02-13 DIAGNOSIS — Z95810 Presence of automatic (implantable) cardiac defibrillator: Secondary | ICD-10-CM | POA: Diagnosis present

## 2011-02-13 DIAGNOSIS — E860 Dehydration: Secondary | ICD-10-CM

## 2011-02-13 DIAGNOSIS — K56609 Unspecified intestinal obstruction, unspecified as to partial versus complete obstruction: Principal | ICD-10-CM | POA: Diagnosis present

## 2011-02-13 DIAGNOSIS — I5022 Chronic systolic (congestive) heart failure: Secondary | ICD-10-CM | POA: Diagnosis present

## 2011-02-13 DIAGNOSIS — I482 Chronic atrial fibrillation, unspecified: Secondary | ICD-10-CM | POA: Diagnosis present

## 2011-02-13 DIAGNOSIS — Z85118 Personal history of other malignant neoplasm of bronchus and lung: Secondary | ICD-10-CM

## 2011-02-13 DIAGNOSIS — Z950 Presence of cardiac pacemaker: Secondary | ICD-10-CM

## 2011-02-13 DIAGNOSIS — K449 Diaphragmatic hernia without obstruction or gangrene: Secondary | ICD-10-CM | POA: Diagnosis present

## 2011-02-13 DIAGNOSIS — K2970 Gastritis, unspecified, without bleeding: Secondary | ICD-10-CM | POA: Diagnosis present

## 2011-02-13 DIAGNOSIS — Z8546 Personal history of malignant neoplasm of prostate: Secondary | ICD-10-CM

## 2011-02-13 DIAGNOSIS — I509 Heart failure, unspecified: Secondary | ICD-10-CM | POA: Diagnosis present

## 2011-02-13 LAB — CBC
HCT: 36.1 % — ABNORMAL LOW (ref 39.0–52.0)
MCHC: 35.2 g/dL (ref 30.0–36.0)
Platelets: 183 10*3/uL (ref 150–400)
RDW: 14.8 % (ref 11.5–15.5)
WBC: 10.2 10*3/uL (ref 4.0–10.5)

## 2011-02-13 LAB — COMPREHENSIVE METABOLIC PANEL
ALT: 19 U/L (ref 0–53)
AST: 20 U/L (ref 0–37)
Albumin: 4.9 g/dL (ref 3.5–5.2)
CO2: 24 mEq/L (ref 19–32)
Chloride: 94 mEq/L — ABNORMAL LOW (ref 96–112)
Creatinine, Ser: 1.45 mg/dL — ABNORMAL HIGH (ref 0.50–1.35)
Sodium: 137 mEq/L (ref 135–145)
Total Bilirubin: 1 mg/dL (ref 0.3–1.2)

## 2011-02-13 LAB — DIFFERENTIAL
Basophils Absolute: 0 10*3/uL (ref 0.0–0.1)
Basophils Relative: 0 % (ref 0–1)
Lymphocytes Relative: 9 % — ABNORMAL LOW (ref 12–46)
Monocytes Absolute: 0.7 10*3/uL (ref 0.1–1.0)
Neutro Abs: 8.6 10*3/uL — ABNORMAL HIGH (ref 1.7–7.7)
Neutrophils Relative %: 84 % — ABNORMAL HIGH (ref 43–77)

## 2011-02-13 NOTE — ED Notes (Signed)
PT. REPORTS PERSISTENT VOMITTING WITH RIGHT ABDOMINAL PIAN ONSET TODAY , DENIES DIARRHEA ,  NO FEVER OR CHILLS.

## 2011-02-14 ENCOUNTER — Emergency Department (HOSPITAL_COMMUNITY): Payer: PRIVATE HEALTH INSURANCE

## 2011-02-14 ENCOUNTER — Encounter (HOSPITAL_COMMUNITY): Payer: Self-pay | Admitting: Internal Medicine

## 2011-02-14 ENCOUNTER — Other Ambulatory Visit: Payer: Self-pay

## 2011-02-14 DIAGNOSIS — R109 Unspecified abdominal pain: Secondary | ICD-10-CM

## 2011-02-14 DIAGNOSIS — K56609 Unspecified intestinal obstruction, unspecified as to partial versus complete obstruction: Secondary | ICD-10-CM

## 2011-02-14 LAB — LIPASE, BLOOD: Lipase: 94 U/L — ABNORMAL HIGH (ref 11–59)

## 2011-02-14 LAB — URINALYSIS, ROUTINE W REFLEX MICROSCOPIC
Glucose, UA: NEGATIVE mg/dL
Hgb urine dipstick: NEGATIVE
Protein, ur: 100 mg/dL — AB
Specific Gravity, Urine: 1.026 (ref 1.005–1.030)

## 2011-02-14 LAB — URINE MICROSCOPIC-ADD ON

## 2011-02-14 LAB — CBC
MCHC: 35 g/dL (ref 30.0–36.0)
RDW: 14.9 % (ref 11.5–15.5)
WBC: 10.7 10*3/uL — ABNORMAL HIGH (ref 4.0–10.5)

## 2011-02-14 LAB — GLUCOSE, CAPILLARY: Glucose-Capillary: 183 mg/dL — ABNORMAL HIGH (ref 70–99)

## 2011-02-14 LAB — LACTIC ACID, PLASMA: Lactic Acid, Venous: 4.4 mmol/L — ABNORMAL HIGH (ref 0.5–2.2)

## 2011-02-14 LAB — MRSA PCR SCREENING: MRSA by PCR: NEGATIVE

## 2011-02-14 LAB — CARDIAC PANEL(CRET KIN+CKTOT+MB+TROPI)
Relative Index: INVALID (ref 0.0–2.5)
Relative Index: INVALID (ref 0.0–2.5)
Relative Index: INVALID (ref 0.0–2.5)
Total CK: 44 U/L (ref 7–232)
Total CK: 43 U/L (ref 7–232)

## 2011-02-14 LAB — APTT: aPTT: 31 seconds (ref 24–37)

## 2011-02-14 MED ORDER — ROSUVASTATIN CALCIUM 40 MG PO TABS
40.0000 mg | ORAL_TABLET | Freq: Every day | ORAL | Status: DC
  Administered 2011-02-14: 40 mg via ORAL
  Filled 2011-02-14: qty 1

## 2011-02-14 MED ORDER — SODIUM CHLORIDE 0.9 % IV BOLUS (SEPSIS)
1000.0000 mL | INTRAVENOUS | Status: AC
  Administered 2011-02-14: 1000 mL via INTRAVENOUS

## 2011-02-14 MED ORDER — WARFARIN SODIUM 5 MG PO TABS
5.0000 mg | ORAL_TABLET | Freq: Once | ORAL | Status: DC
  Filled 2011-02-14: qty 1

## 2011-02-14 MED ORDER — METOPROLOL SUCCINATE ER 50 MG PO TB24
50.0000 mg | ORAL_TABLET | Freq: Every day | ORAL | Status: DC
  Administered 2011-02-14: 50 mg via ORAL
  Filled 2011-02-14: qty 1

## 2011-02-14 MED ORDER — ONDANSETRON HCL 4 MG/2ML IJ SOLN
4.0000 mg | Freq: Once | INTRAMUSCULAR | Status: AC
  Administered 2011-02-14: 4 mg via INTRAVENOUS
  Filled 2011-02-14: qty 2

## 2011-02-14 MED ORDER — DIGOXIN 125 MCG PO TABS
125.0000 ug | ORAL_TABLET | Freq: Every day | ORAL | Status: DC
  Administered 2011-02-14: 125 ug via ORAL
  Filled 2011-02-14: qty 1

## 2011-02-14 MED ORDER — ISOSORBIDE MONONITRATE ER 30 MG PO TB24
30.0000 mg | ORAL_TABLET | Freq: Every day | ORAL | Status: DC
  Administered 2011-02-14: 30 mg via ORAL
  Filled 2011-02-14: qty 1

## 2011-02-14 MED ORDER — CIPROFLOXACIN IN D5W 400 MG/200ML IV SOLN
400.0000 mg | Freq: Two times a day (BID) | INTRAVENOUS | Status: DC
  Administered 2011-02-14 (×2): 400 mg via INTRAVENOUS
  Filled 2011-02-14 (×5): qty 200

## 2011-02-14 MED ORDER — MORPHINE SULFATE 2 MG/ML IJ SOLN
1.0000 mg | INTRAMUSCULAR | Status: DC | PRN
  Administered 2011-02-15: 1 mg via INTRAVENOUS
  Filled 2011-02-14: qty 1

## 2011-02-14 MED ORDER — ONDANSETRON HCL 4 MG PO TABS: 4.0000 mg | ORAL_TABLET | Freq: Four times a day (QID) | ORAL | Status: DC | PRN

## 2011-02-14 MED ORDER — ALLOPURINOL 300 MG PO TABS
300.0000 mg | ORAL_TABLET | Freq: Every day | ORAL | Status: DC
  Administered 2011-02-14: 300 mg via ORAL
  Filled 2011-02-14: qty 1

## 2011-02-14 MED ORDER — SODIUM CHLORIDE 0.9 % IV SOLN
INTRAVENOUS | Status: DC
  Administered 2011-02-14: 10:00:00 via INTRAVENOUS

## 2011-02-14 MED ORDER — ACETAMINOPHEN 325 MG PO TABS
650.0000 mg | ORAL_TABLET | Freq: Four times a day (QID) | ORAL | Status: DC | PRN
  Administered 2011-02-21 (×2): 650 mg via ORAL
  Filled 2011-02-14 (×2): qty 2

## 2011-02-14 MED ORDER — SODIUM CHLORIDE 0.9 % IV SOLN: INTRAVENOUS | Status: AC

## 2011-02-14 MED ORDER — METOPROLOL TARTRATE 1 MG/ML IV SOLN
5.0000 mg | Freq: Four times a day (QID) | INTRAVENOUS | Status: DC
  Administered 2011-02-14 – 2011-02-20 (×19): 5 mg via INTRAVENOUS
  Filled 2011-02-14 (×27): qty 5

## 2011-02-14 MED ORDER — PANTOPRAZOLE SODIUM 40 MG IV SOLR
40.0000 mg | INTRAVENOUS | Status: DC
  Administered 2011-02-14 – 2011-02-19 (×6): 40 mg via INTRAVENOUS
  Filled 2011-02-14 (×7): qty 40

## 2011-02-14 MED ORDER — HEPARIN SODIUM (PORCINE) 5000 UNIT/ML IJ SOLN
5000.0000 [IU] | Freq: Three times a day (TID) | INTRAMUSCULAR | Status: DC
  Administered 2011-02-14 – 2011-02-18 (×10): 5000 [IU] via SUBCUTANEOUS
  Filled 2011-02-14 (×15): qty 1

## 2011-02-14 MED ORDER — METRONIDAZOLE IN NACL 5-0.79 MG/ML-% IV SOLN
500.0000 mg | Freq: Three times a day (TID) | INTRAVENOUS | Status: DC
  Administered 2011-02-14 – 2011-02-15 (×3): 500 mg via INTRAVENOUS
  Filled 2011-02-14 (×5): qty 100

## 2011-02-14 MED ORDER — ONDANSETRON HCL 4 MG/2ML IJ SOLN: 4.0000 mg | Freq: Four times a day (QID) | INTRAMUSCULAR | Status: DC | PRN

## 2011-02-14 MED ORDER — ACETAMINOPHEN 650 MG RE SUPP: 650.0000 mg | Freq: Four times a day (QID) | RECTAL | Status: DC | PRN

## 2011-02-14 NOTE — Consult Note (Signed)
Reason for Consult:SBO Referring Physician: Dr. Barnie Del   HPI: [name] is an 77 y.o. male with history of a.fib on Coumadin, CAD, CHF, DM who was recently admitted for syncope/hypotension. He was sent home in a stable condition, though his INR was noted to be low. He bagan habing rather acute onset of abd distention, pain, and N/V since yesterday. He reports pain  Mostly in the lower right abdomen. He hasn't been to hold down even water. He has been admitted by the hospitalist service and after a CT scan was reported, there is concern for high grade bowel obstruction. He denies a BM in the past few days, thinks he may have passed flatus yesterday. Surgery consult requested for SBO. Pt denies any prior hx of abd surgery. He has recently been diagnosed with prostate cancer and is s/p recent seeding.  Past Medical History:  Past Medical History  Diagnosis Date  . Diabetes mellitus   . Hypertension   . CHF (congestive heart failure)   . CVA (cerebral vascular accident)   . MI (myocardial infarction)   . Pacemaker   . S/P CABG (coronary artery bypass graft)   . Gout   . Nephrolithiasis   . Lung cancer?   . Coronary artery disease     Surgical History:  Past Surgical History  Procedure Date  . Colonoscopy   . Upper endoscpopy   . Icd insertion     status post BIV  . Coronary artery bypass graft     Family History:  Family History  Problem Relation Age of Onset  . Alzheimer's disease Father 63  . Cancer Mother 108    metastatic cancer  . Diabetes Sister 25  . Diabetes Brother   . Diabetes Brother   . Diabetes Brother     Social History:  reports that he has never smoked. He does not have any smokeless tobacco history on file. He reports that he does not drink alcohol. His drug history not on file.  Allergies:  Allergies  Allergen Reactions  . Aspirin     Medications: I have reviewed the patient's current medications.  ROS: See HPI for pertinent findings,  otherwise complete 10 system review negative.  Physical Exam: Blood pressure 134/84, pulse 64, temperature 97 F (36.1 C), temperature source Oral, resp. rate 18, height 5\' 6"  (1.676 m), weight 142 lb 10.2 oz (64.7 kg), SpO2 96.00%.  General Appearance:  Alert, cooperative, no distress, appears stated age. Holding emesis basin containing bilious emesis  Head:  Normocephalic, without obvious abnormality, atraumatic  ENT: Unremarkable  Neck: Supple, symmetrical, trachea midline, no adenopathy, thyroid: not enlarged, symmetric, no tenderness/mass/nodules  Lungs:   Clear to auscultation bilaterally, no w/r/r, respirations unlabored without use of accessory muscles.  Chest Wall:  No tenderness or deformity  Heart:  Regular rate and rhythm, S1, S2 normal, no murmur, rub or gallop. Carotids 2+ without bruit.  Abdomen:   Distended but soft. Mild tender in (R)mid abdomen. No masses or peritoneal signs. No hernias  Genitalia:  Normal. No hernias  Rectal:  Deferred.  Extremities: Extremities normal, atraumatic, no cyanosis or edema  Pulses: 2+ and symmetric  Skin: Skin color, texture, turgor normal, no rashes or lesions  Neurologic: Normal affect, no gross deficits.     Labs: CBC  Basename 02/14/11 0105 02/13/11 2236  WBC 10.7* 10.2  HGB 13.2 12.7*  HCT 37.7* 36.1*  PLT 184 183   MET  Basename 02/13/11 2236  NA 137  K  4.6  CL 94*  CO2 24  GLUCOSE 197*  BUN 25*  CREATININE 1.45*  CALCIUM 12.8*    Basename 02/14/11 0105 02/13/11 2236  PROT -- 9.5*  ALBUMIN -- 4.9  AST -- 20  ALT -- 19  ALKPHOS -- 107  BILITOT -- 1.0  BILIDIR -- --  IBILI -- --  LIPASE 94* --   PT/INR  Basename 02/14/11 0105  LABPROT 14.8  INR 1.14   ABG No results found for this basename: PHART:2,PCO2:2,PO2:2,HCO3:2 in the last 72 hours    Ct Abdomen Pelvis Wo Contrast  02/14/2011  *RADIOLOGY REPORT*  Clinical Data: Vomiting, right lower quadrant abdominal pain, and epigastric pain.  Elevated  lactic acid.  CT ABDOMEN AND PELVIS WITHOUT CONTRAST  Technique:  Multidetector CT imaging of the abdomen and pelvis was performed following the standard protocol without intravenous contrast.  Comparison: CT of the abdomen and pelvis performed 11/28/2010, and renal ultrasound performed 12/14/2010  Findings: Mild scarring is noted at the lung bases, with chronic associated lung changes seen.  Unusual focal scarring is noted along the fissures, without definite evidence of a mass.  Diffuse coronary artery calcifications are seen.  The patient is status post median sternotomy; pacemaker/AICD leads are partially imaged. The distal esophagus is filled with fluid.  The liver and spleen are unremarkable in appearance.  The gallbladder is within normal limits.  The pancreas and adrenal glands are unremarkable.  A 3.8 cm cyst is again noted arising from the lower pole of the left kidney; this is slightly higher than simple fluid attenuation, but was noted to reflect a cyst on the prior ultrasound.  It is also relatively stable from the prior CT.  There is a 0.7 cm stone noted at the lower pole of the right kidney, also stable in appearance.  Mild nonspecific perinephric stranding is noted bilaterally.  There is no evidence of hydronephrosis.  No obstructing ureteral stones are identified.  There is significant distension of the stomach with fluid.  The proximal small bowel is diffusely distended, measuring up to 5.3 cm in diameter, to the level of the proximal ileum at the left mid abdomen.  There is gradual fecalization within the proximal ileum, with distended fecalized loops ending at a transition point at the left mid abdomen.  If the patient has a history of surgery, this most likely reflects an adhesion.  The degree of fecalization suggests underlying small bowel dysmotility and incomplete obstruction, though the more distal small bowel loops are diffusely decompressed, and this may have gradually evolved from a  partial to a high-grade small bowel obstruction.  No free fluid is identified.  No acute vascular abnormalities are seen.  Diffuse calcification is noted along the abdominal aorta and its branches, particularly prominent at the origins of the superior mesenteric artery and both renal arteries.  The appendix is normal in caliber, without evidence for appendicitis.  The colon is largely decompressed, also concerning for high-grade small bowel obstruction.  Diverticulosis is noted along the proximal sigmoid colon, without evidence of diverticulitis.  The bladder is mildly distended and grossly unremarkable in appearance.  Postoperative change is noted adjacent to the prostate.  The prostate remains normal in size.  No inguinal lymphadenopathy is seen.  No acute osseous abnormalities are identified.  A stable apparent compression deformity at L4 may reflect a very large Schmorl's node.  IMPRESSION:  1.  Apparent relatively high-grade small bowel obstruction noted at the proximal ileum, with diffuse distension of proximal small  bowel loops to 5.3 cm in diameter, and a transition point at the left mid abdomen.  If the patient has a history of surgery, this most likely reflects an adhesion.  Fecalization of the proximal small bowel just before the transition point suggests underlying small bowel dysmotility and incomplete obstruction, though the more distal small bowel loops are diffusely decompressed; this may have gradually evolved from a partial to a high-grade small bowel obstruction. 2.  Significant distension of the stomach with fluid; the distal esophagus is also filled with fluid.  The patient's symptoms may be partially relieved with a nasogastric tube, as deemed clinically appropriate. 3.  Left renal cyst and right renal stone, stable in appearance. 4.  No evidence for pancreatitis. 5.  Diverticulosis along the proximal sigmoid colon, without evidence of diverticulitis. 6.  Diffuse calcification along the  abdominal aorta and its branches, particularly prominent at the origins of the superior mesenteric artery and both renal arteries. 7.  Unusual pattern of scarring at the lung bases, without definite evidence of a mass. 8.  Diffuse coronary artery calcifications seen. 9.  Stable apparent compression deformity at L4 may reflect a very large Schmorl's node.  Findings were discussed with Dr. Eber Hong at 05:14 a.m. on 02/14/2011.  Original Report Authenticated By: Tonia Ghent, M.D.    Assessment SBO of uncertain etiology, no prior surgical history. Possible embolic event while pt was hypotensive and INR sub therapeutic, leading to ischemic enteritis. Lactate elevated at 4.4. No pneumatosis or wall thickening seen on CT Multiple co morbidities as listed.  Plan: Agree with NG placement and decompression, bowel rest IVF support Sub Q heparin ok. Will recheck lactic acid level and xrays in am. Discussed with pt possible need for urgent operative intervention if no considerable improvement or clinical worsening in next 24-36hrs.

## 2011-02-14 NOTE — Progress Notes (Signed)
ANTICOAGULATION CONSULT NOTE - Initial Consult  Pharmacy Consult for coumadin and cipro Indication: atrial fibrillation and stomach pain    Allergies  Allergen Reactions  . Aspirin     Patient Measurements: Height: 5\' 6"  (167.6 cm) Weight: 142 lb 10.2 oz (64.7 kg) IBW/kg (Calculated) : 63.8  Adjusted Body Weight:    Vital Signs: Temp: 97 F (36.1 C) (12/05 0554) Temp src: Oral (12/05 0554) BP: 134/84 mmHg (12/05 0554) Pulse Rate: 64  (12/05 0554)  Labs:  Basename 02/14/11 0105 02/13/11 2236  HGB 13.2 12.7*  HCT 37.7* 36.1*  PLT 184 183  APTT 31 --  LABPROT 14.8 --  INR 1.14 --  HEPARINUNFRC -- --  CREATININE -- 1.45*  CKTOTAL -- --  CKMB -- --  TROPONINI -- --   Estimated Creatinine Clearance: 39.1 ml/min (by C-G formula based on Cr of 1.45).  Medical History: Past Medical History  Diagnosis Date  . Diabetes mellitus   . Hypertension   . CHF (congestive heart failure)   . CVA (cerebral vascular accident)   . MI (myocardial infarction)   . Pacemaker   . S/P CABG (coronary artery bypass graft)   . Gout   . Nephrolithiasis   . Lung cancer   . Colon cancer   . Coronary artery disease     Medications:  Prescriptions prior to admission  Medication Sig Dispense Refill  . allopurinol (ZYLOPRIM) 300 MG tablet Take 300 mg by mouth daily.        [name] atorvastatin (LIPITOR) 20 MG tablet Take 20 mg by mouth daily.        [name] CALCIUM-VITAMIN D PO Take 1 tablet by mouth daily.        . Cyanocobalamin (VITAMIN B-12 PO) Take 1 tablet by mouth daily.        . digoxin (LANOXIN) 0.125 MG tablet Take 125 mcg by mouth daily.        . fish oil-omega-3 fatty acids 1000 MG capsule Take 2 g by mouth daily.        . furosemide (LASIX) 20 MG tablet Take 20 mg by mouth daily.        . isosorbide mononitrate (IMDUR) 30 MG 24 hr tablet Take 30 mg by mouth daily.        [name] lisinopril (PRINIVIL,ZESTRIL) 10 MG tablet Take 10 mg by mouth daily.        . metoprolol (TOPROL-XL) 50 MG 24 hr  tablet Take 50 mg by mouth daily.        [name] omeprazole (PRILOSEC) 20 MG capsule Take 20 mg by mouth daily.        . potassium chloride SA (K-DUR,KLOR-CON) 20 MEQ tablet Take 20 mEq by mouth 2 (two) times daily.        [name] warfarin (COUMADIN) 2.5 MG tablet Take 2.5 mg by mouth daily. Take 2.5 mg everyday except on Wednesday. Take 3.75 mg on Wednesday.        Assessment: 77 yo with subtherapeutic inr for afib. Also with epigastric pain, n,v, elevated lactic acid and high lipase. Hx of alcoholism recently quit. Here to r/o pancreastitis.  Goal of Therapy:  INR 2-3   Plan:  Coumadin 5 mg at 1800 and f/u inr for further dosing. cipro 400 bid   Janice Coffin 02/14/2011,7:19 AM

## 2011-02-14 NOTE — Consult Note (Signed)
NGT suction container already full. I changed it. Patient feeling better. No tenderness on my exam. Will follow closely. Patient examined and I agree with the assessment and plan  Edythe Riches E

## 2011-02-14 NOTE — ED Notes (Signed)
EKG PREFORMED BY EMT R Janey Petron. NEW AND OLD EKG GIVEN TO DR Hyacinth Meeker

## 2011-02-14 NOTE — ED Notes (Signed)
Pt denies any pain n/v/d upon transport to CT, will continue to monitor pt.

## 2011-02-14 NOTE — ED Notes (Signed)
Admitting MD at bedside, pt awaiting inpt beds assignment.  

## 2011-02-14 NOTE — Progress Notes (Signed)
Patient was admitted earlier this morning.   Patient was seen and examined by myself. Report of the CT scan was reviewed. Patient has evidence for small bowel obstruction. Patient denies any history of abdominal surgeries. Other, nonspecific findings are noted on the CT scan.  On examination, he does have some bowel movements, but is tender to palpation, especially in the right side of his abdomen. There is no rebound or rigidity. Abdomen is mildly distended. No masses appreciated.  We'll proceed with NG tube placement to intermittent wall suction. We'll consult general surgery to evaluate this patient. He'll be kept strict n.p.o.  Metoprolol intravenously will be given to control his heart rate. At this point because he has SBO we will discontinue his warfarin. Subcutaneous heparin will be utilized for DVT, prophylaxis.

## 2011-02-14 NOTE — H&P (Signed)
[name] is an 77 y.o. male.   PCP is Dr. Pearson Grippe Chief Complaint: Abdominal pain. HPI: 77 year-old male with history of CAD status post CABG, hysterectomy fibrillation on Coumadin, history of heart block and cardiomyopathy on pacemaker and defibrillator, who was recently in the hospital for syncope felt to be secondary to vasovagal episode presented the ER because of persistent abdominal pain since yesterday morning. Along with it he had multiple episodes of nausea and vomiting denies any diarrhea. The pain is located mostly in the epigastric and runs across his upper part of the abdomen. Patient states he was not able to eat anything. In the ER labs reveal a high lactic acid level, high calcium level and high lipase level. A CT abdomen and pelvis has been ordered and is pending. Patient has history of alcoholism which he quit completely 6 months ago. Patient also had recent seeding of his prostate for prostate cancer. At this time patient has been admitted for abdominal pain. Past Medical History  Diagnosis Date  . Diabetes mellitus   . Hypertension   . CHF (congestive heart failure)   . CVA (cerebral vascular accident)   . MI (myocardial infarction)   . Pacemaker   . S/P CABG (coronary artery bypass graft)   . Gout   . Nephrolithiasis   . Lung cancer   . Colon cancer   . Coronary artery disease     Past Surgical History  Procedure Date  . Colonoscopy   . Upper endoscpopy   . Icd insertion     status post BIV  . Coronary artery bypass graft     Family History  Problem Relation Age of Onset  . Alzheimer's disease Father 19  . Cancer Mother 91    metastatic cancer  . Diabetes Sister 62  . Diabetes Brother   . Diabetes Brother   . Diabetes Brother    Social History:  reports that he has never smoked. He does not have any smokeless tobacco history on file. He reports that he does not drink alcohol. His drug history not on file.  Allergies:  Allergies  Allergen  Reactions  . Aspirin     Medications Prior to Admission  Medication Dose Route Frequency Provider Last Rate Last Dose  . ondansetron (ZOFRAN) injection 4 mg  4 mg Intravenous Once Vida Roller, MD   4 mg at 02/14/11 0101  . sodium chloride 0.9 % bolus 1,000 mL  1,000 mL Intravenous STAT Vida Roller, MD   1,000 mL at 02/14/11 0101   Medications Prior to Admission  Medication Sig Dispense Refill  . allopurinol (ZYLOPRIM) 300 MG tablet Take 300 mg by mouth daily.        [name] atorvastatin (LIPITOR) 20 MG tablet Take 20 mg by mouth daily.        [name] CALCIUM-VITAMIN D PO Take 1 tablet by mouth daily.        . Cyanocobalamin (VITAMIN B-12 PO) Take 1 tablet by mouth daily.        . digoxin (LANOXIN) 0.125 MG tablet Take 125 mcg by mouth daily.        . fish oil-omega-3 fatty acids 1000 MG capsule Take 2 g by mouth daily.        . furosemide (LASIX) 20 MG tablet Take 20 mg by mouth daily.        . isosorbide mononitrate (IMDUR) 30 MG 24 hr tablet Take 30 mg by mouth daily.        [name]  lisinopril (PRINIVIL,ZESTRIL) 10 MG tablet Take 10 mg by mouth daily.        . metoprolol (TOPROL-XL) 50 MG 24 hr tablet Take 50 mg by mouth daily.        [name] omeprazole (PRILOSEC) 20 MG capsule Take 20 mg by mouth daily.        . potassium chloride SA (K-DUR,KLOR-CON) 20 MEQ tablet Take 20 mEq by mouth 2 (two) times daily.        [name] warfarin (COUMADIN) 2.5 MG tablet Take 2.5 mg by mouth daily. Take 2.5 mg everyday except on Wednesday. Take 3.75 mg on Wednesday.        Results for orders placed during the hospital encounter of 02/13/11 (from the past 48 hour(s))  CBC     Status: Abnormal   Collection Time   02/13/11 10:36 PM      Component Value Range Comment   WBC 10.2  4.0 - 10.5 (K/uL)    RBC 3.82 (*) 4.22 - 5.81 (MIL/uL)    Hemoglobin 12.7 (*) 13.0 - 17.0 (g/dL)    HCT 40.9 (*) 81.1 - 52.0 (%)    MCV 94.5  78.0 - 100.0 (fL)    MCH 33.2  26.0 - 34.0 (pg)    MCHC 35.2  30.0 - 36.0 (g/dL)    RDW 91.4  78.2 - 95.6  (%)    Platelets 183  150 - 400 (K/uL)   DIFFERENTIAL     Status: Abnormal   Collection Time   02/13/11 10:36 PM      Component Value Range Comment   Neutrophils Relative 84 (*) 43 - 77 (%)    Neutro Abs 8.6 (*) 1.7 - 7.7 (K/uL)    Lymphocytes Relative 9 (*) 12 - 46 (%)    Lymphs Abs 0.9  0.7 - 4.0 (K/uL)    Monocytes Relative 7  3 - 12 (%)    Monocytes Absolute 0.7  0.1 - 1.0 (K/uL)    Eosinophils Relative 0  0 - 5 (%)    Eosinophils Absolute 0.0  0.0 - 0.7 (K/uL)    Basophils Relative 0  0 - 1 (%)    Basophils Absolute 0.0  0.0 - 0.1 (K/uL)   COMPREHENSIVE METABOLIC PANEL     Status: Abnormal   Collection Time   02/13/11 10:36 PM      Component Value Range Comment   Sodium 137  135 - 145 (mEq/L)    Potassium 4.6  3.5 - 5.1 (mEq/L)    Chloride 94 (*) 96 - 112 (mEq/L)    CO2 24  19 - 32 (mEq/L)    Glucose, Bld 197 (*) 70 - 99 (mg/dL)    BUN 25 (*) 6 - 23 (mg/dL)    Creatinine, Ser 2.13 (*) 0.50 - 1.35 (mg/dL)    Calcium 08.6 (*) 8.4 - 10.5 (mg/dL)    Total Protein 9.5 (*) 6.0 - 8.3 (g/dL)    Albumin 4.9  3.5 - 5.2 (g/dL)    AST 20  0 - 37 (U/L)    ALT 19  0 - 53 (U/L)    Alkaline Phosphatase 107  39 - 117 (U/L)    Total Bilirubin 1.0  0.3 - 1.2 (mg/dL)    GFR calc non Af Amer 45 (*) >90 (mL/min)    GFR calc Af Amer 52 (*) >90 (mL/min)   URINALYSIS, ROUTINE W REFLEX MICROSCOPIC     Status: Abnormal   Collection Time   02/14/11 12:00 AM  Component Value Range Comment   Color, Urine AMBER (*) YELLOW  BIOCHEMICALS MAY BE AFFECTED BY COLOR   APPearance CLOUDY (*) CLEAR     Specific Gravity, Urine 1.026  1.005 - 1.030     pH 5.0  5.0 - 8.0     Glucose, UA NEGATIVE  NEGATIVE (mg/dL)    Hgb urine dipstick NEGATIVE  NEGATIVE     Bilirubin Urine SMALL (*) NEGATIVE     Ketones, ur 15 (*) NEGATIVE (mg/dL)    Protein, ur 409 (*) NEGATIVE (mg/dL)    Urobilinogen, UA 0.2  0.0 - 1.0 (mg/dL)    Nitrite NEGATIVE  NEGATIVE     Leukocytes, UA SMALL (*) NEGATIVE    URINE  MICROSCOPIC-ADD ON     Status: Abnormal   Collection Time   02/14/11 12:00 AM      Component Value Range Comment   Squamous Epithelial / LPF FEW (*) RARE     WBC, UA 3-6  <3 (WBC/hpf)    Bacteria, UA FEW (*) RARE     Casts HYALINE CASTS (*) NEGATIVE    OCCULT BLOOD, POC DEVICE     Status: Normal   Collection Time   02/14/11 12:36 AM      Component Value Range Comment   Fecal Occult Bld NEGATIVE     LIPASE, BLOOD     Status: Abnormal   Collection Time   02/14/11  1:05 AM      Component Value Range Comment   Lipase 94 (*) 11 - 59 (U/L)   CBC     Status: Abnormal   Collection Time   02/14/11  1:05 AM      Component Value Range Comment   WBC 10.7 (*) 4.0 - 10.5 (K/uL)    RBC 3.97 (*) 4.22 - 5.81 (MIL/uL)    Hemoglobin 13.2  13.0 - 17.0 (g/dL)    HCT 81.1 (*) 91.4 - 52.0 (%)    MCV 95.0  78.0 - 100.0 (fL)    MCH 33.2  26.0 - 34.0 (pg)    MCHC 35.0  30.0 - 36.0 (g/dL)    RDW 78.2  95.6 - 21.3 (%)    Platelets 184  150 - 400 (K/uL)   APTT     Status: Normal   Collection Time   02/14/11  1:05 AM      Component Value Range Comment   aPTT 31  24 - 37 (seconds)   PROTIME-INR     Status: Normal   Collection Time   02/14/11  1:05 AM      Component Value Range Comment   Prothrombin Time 14.8  11.6 - 15.2 (seconds)    INR 1.14  0.00 - 1.49    DIGOXIN LEVEL     Status: Abnormal   Collection Time   02/14/11  1:05 AM      Component Value Range Comment   Digoxin Level 0.6 (*) 0.8 - 2.0 (ng/mL)   LACTIC ACID, PLASMA     Status: Abnormal   Collection Time   02/14/11  1:06 AM      Component Value Range Comment   Lactic Acid, Venous 4.4 (*) 0.5 - 2.2 (mmol/L)   POCT I-STAT TROPONIN I     Status: Normal   Collection Time   02/14/11  1:17 AM      Component Value Range Comment   Troponin i, poc 0.02  0.00 - 0.08 (ng/mL)    Comment 3  No results found.  Review of Systems  Constitutional: Negative.   HENT: Negative.   Eyes: Negative.   Respiratory: Negative.   Cardiovascular:  Negative.   Gastrointestinal: Positive for nausea, vomiting and abdominal pain.  Genitourinary: Negative.   Musculoskeletal: Negative.   Skin: Negative.   Neurological: Negative.   Endo/Heme/Allergies: Negative.   Psychiatric/Behavioral: Negative.     Blood pressure 156/76, pulse 62, temperature 97.4 F (36.3 C), temperature source Oral, resp. rate 22, SpO2 98.00%. Physical Exam  Constitutional: He is oriented to person, place, and time. He appears well-developed and well-nourished. No distress.  HENT:  Head: Normocephalic and atraumatic.  Right Ear: External ear normal.  Left Ear: External ear normal.  Nose: Nose normal.  Mouth/Throat: Oropharynx is clear and moist. No oropharyngeal exudate.  Eyes: Conjunctivae and EOM are normal. Pupils are equal, round, and reactive to light. Right eye exhibits no discharge. Left eye exhibits no discharge. No scleral icterus.  Neck: Normal range of motion. Neck supple.  Cardiovascular: Normal rate, regular rhythm, normal heart sounds and intact distal pulses.   Respiratory: Effort normal and breath sounds normal. No respiratory distress. He has no wheezes. He has no rales.  GI: Soft. There is tenderness. There is no rebound and no guarding.       Tenderness in the epigastric area and right upper quadrant.  Musculoskeletal: Normal range of motion. He exhibits no edema and no tenderness.  Neurological: He is alert and oriented to person, place, and time. He has normal reflexes. No cranial nerve deficit. Coordination normal.  Skin: Skin is warm and dry. He is not diaphoretic.  Psychiatric: His behavior is normal.     Assessment/Plan #1. Abdominal pain unclear etiology could be pancreatitis versus colitis given his high lactic acid level. #2. Hypercalcemia. #3. Renal insufficiency. #4. History of alcoholism quit completely 6 months ago. #5. History of prostate cancer status post recent seeding. #6. CAD status post CABG. #7. History of atrial  fibrillation on Coumadin. #8. History of cardiomyopathy status post defibrillator and pacemaker placement. #9. Possible UTI.  Plan Admit to telemetry as patient is hemodynamically stable. Patient was given 1 L fluid normal saline. I am going to continue hydration normal saline at 100 cc per hour. Patient has history of cardiomyopathy so we need to be careful not to fluid overload. At this time not sure what could be the cause for his abdominal pain. Given his mildly elevated lipase it could be Pancreatitis. But it could also be colitis given his high lactic acid level. We are awaiting a CT abdomen and pelvis. For now we will keep the patient n.p.o. except medications. Patient does have hypercalcemia. I am going to check serum protein electrophoresis and urine electrophoresis. We'll also get parathormone levels and vitamin D levels. Recheck calcium again in a.m. after hydration. His recent chest x-ray did not show any mass. I will empirically keep patient on Cipro and Flagyl. If there is no evidence of colitis there may discontinue Flagyl. Until urine cultures available continue Cipro. Closely follow his metabolic panel for renal insufficiency. Further recommendations as condition evolves and based on the tests ordered particularly CAT scan.   Zenith Lamphier N. 02/14/2011, 4:01 AM

## 2011-02-14 NOTE — ED Provider Notes (Addendum)
History     CSN: [id number] Arrival date & time: 02/13/2011 10:33 PM   First MD Initiated Contact with Patient 02/14/11 0022      Chief Complaint  Patient presents with  . Emesis    (Consider location/radiation/quality/duration/timing/severity/associated sxs/prior treatment) HPI Comments: Patient reports nausea and vomiting persistent throughout the day, notes too numerous to count episodes, nonbloody but has some dark green color to the last few episodes. Admits to right upper quadrant pain in addition to this nausea and vomiting but denies rectal bleeding, diarrhea, swelling. He denies headache, chest pain, cough or shortness of breath. According to medical records he was recently admitted for altered mental status thought to be related to orthostatic dehydration. Was treated inpatient with IV fluids and discharged in the last several days. He saw his family Dr. Dr. Pearson Grippe earlier today and had what he describes as a chest x-ray and lab work done. He was sent home with some medication which he started  Symptoms are constant Nothing makes better or worse No associated fevers chills coughing or shortness of breath Symptoms are moderate to severe  Patient is a 77 y.o. male presenting with vomiting. The history is provided by the patient and medical records.  Emesis  This is a new problem. Episode onset: last 12 hours. The problem occurs 5 to 10 times per day. The problem has been gradually worsening. The emesis has an appearance of stomach contents and bilious material. There has been no fever. Associated symptoms include abdominal pain ( RUQ pain). Pertinent negatives include no chills, no cough, no diarrhea, no fever, no headaches, no myalgias, no sweats and no URI. Risk factors: recent admission to hospital for AMS.    Past Medical History  Diagnosis Date  . Diabetes mellitus   . Hypertension   . CHF (congestive heart failure)   . CVA (cerebral vascular accident)   . MI  (myocardial infarction)   . Pacemaker   . S/P CABG (coronary artery bypass graft)   . Gout   . Nephrolithiasis   . Lung cancer   . Colon cancer     Past Surgical History  Procedure Date  . Colonoscopy   . Upper endoscpopy   . Icd insertion     status post BIV  . Coronary artery bypass graft     Family History  Problem Relation Age of Onset  . Alzheimer's disease Father 52  . Cancer Mother 39    metastatic cancer  . Diabetes Sister 49  . Diabetes Brother   . Diabetes Brother   . Diabetes Brother     History  Substance Use Topics  . Smoking status: Never Smoker   . Smokeless tobacco: Not on file  . Alcohol Use: No      Review of Systems  Constitutional: Negative for fever and chills.  Respiratory: Negative for cough.   Gastrointestinal: Positive for vomiting and abdominal pain ( RUQ pain). Negative for diarrhea.  Musculoskeletal: Negative for myalgias.  Neurological: Negative for headaches.  All other systems reviewed and are negative.    Allergies  Aspirin  Home Medications   Current Outpatient Rx  Name Route Sig Dispense Refill  . ALLOPURINOL 300 MG PO TABS Oral Take 300 mg by mouth daily.      . ATORVASTATIN CALCIUM 20 MG PO TABS Oral Take 20 mg by mouth daily.      [name] CALCIUM-VITAMIN D PO Oral Take 1 tablet by mouth daily.      [name] VITAMIN  B-12 PO Oral Take 1 tablet by mouth daily.      [name] DIGOXIN 0.125 MG PO TABS Oral Take 125 mcg by mouth daily.      . OMEGA-3 FATTY ACIDS 1000 MG PO CAPS Oral Take 2 g by mouth daily.      . FUROSEMIDE 20 MG PO TABS Oral Take 20 mg by mouth daily.      . ISOSORBIDE MONONITRATE ER 30 MG PO TB24 Oral Take 30 mg by mouth daily.      [name] LISINOPRIL 10 MG PO TABS Oral Take 10 mg by mouth daily.      [name] METOPROLOL SUCCINATE ER 50 MG PO TB24 Oral Take 50 mg by mouth daily.      [name] OMEPRAZOLE 20 MG PO CPDR Oral Take 20 mg by mouth daily.      [name] POTASSIUM CHLORIDE CRYS CR 20 MEQ PO TBCR Oral Take 20 mEq by mouth 2 (two) times  daily.      . WARFARIN SODIUM 2.5 MG PO TABS Oral Take 2.5 mg by mouth daily. Take 2.5 mg everyday except on Wednesday. Take 3.75 mg on Wednesday.      BP 156/76  Pulse 62  Temp(Src) 97.4 F (36.3 C) (Oral)  Resp 22  SpO2 98%  Physical Exam  Nursing note and vitals reviewed. Constitutional: He appears well-developed and well-nourished. No distress.  HENT:  Head: Normocephalic and atraumatic.  Mouth/Throat: No oropharyngeal exudate.       Mucous membranes mildly dehydrated, green emesis stained tongue  Eyes: Conjunctivae and EOM are normal. Pupils are equal, round, and reactive to light. Right eye exhibits no discharge. Left eye exhibits no discharge. No scleral icterus.  Neck: Normal range of motion. Neck supple. No JVD present. No thyromegaly present.  Cardiovascular: Normal rate, regular rhythm and intact distal pulses.  Exam reveals no gallop and no friction rub.   Murmur ( Soft systolic murmur) heard. Pulmonary/Chest: Effort normal and breath sounds normal. No respiratory distress. He has no wheezes. He has no rales.  Abdominal: Soft. Bowel sounds are normal. He exhibits no distension and no mass. There is tenderness ( Right upper and lower quadrant tenderness with mild distention, no guarding, non-peritoneal, no other).  Genitourinary:       Rectal exam with normal tone, greenish yellow mucus stool, some formed stool in the rectal vault, no hemorrhoids masses or fissures  Musculoskeletal: Normal range of motion. He exhibits no edema and no tenderness.  Lymphadenopathy:    He has no cervical adenopathy.  Neurological: He is alert. Coordination normal.  Skin: Skin is warm and dry. No rash noted. No erythema.  Psychiatric: He has a normal mood and affect. His behavior is normal.    ED Course  Procedures (including critical care time)  Labs Reviewed  CBC - Abnormal; Notable for the following:    RBC 3.82 (*)    Hemoglobin 12.7 (*)    HCT 36.1 (*)    All other components  within normal limits  DIFFERENTIAL - Abnormal; Notable for the following:    Neutrophils Relative 84 (*)    Neutro Abs 8.6 (*)    Lymphocytes Relative 9 (*)    All other components within normal limits  COMPREHENSIVE METABOLIC PANEL - Abnormal; Notable for the following:    Chloride 94 (*)    Glucose, Bld 197 (*)    BUN 25 (*)    Creatinine, Ser 1.45 (*)    Calcium 12.8 (*)    Total  Protein 9.5 (*)    GFR calc non Af Amer 45 (*)    GFR calc Af Amer 52 (*)    All other components within normal limits  URINALYSIS, ROUTINE W REFLEX MICROSCOPIC - Abnormal; Notable for the following:    Color, Urine AMBER (*) BIOCHEMICALS MAY BE AFFECTED BY COLOR   APPearance CLOUDY (*)    Bilirubin Urine SMALL (*)    Ketones, ur 15 (*)    Protein, ur 100 (*)    Leukocytes, UA SMALL (*)    All other components within normal limits  URINE MICROSCOPIC-ADD ON - Abnormal; Notable for the following:    Squamous Epithelial / LPF FEW (*)    Bacteria, UA FEW (*)    Casts HYALINE CASTS (*)    All other components within normal limits  LIPASE, BLOOD - Abnormal; Notable for the following:    Lipase 94 (*)    All other components within normal limits  CBC - Abnormal; Notable for the following:    WBC 10.7 (*)    RBC 3.97 (*)    HCT 37.7 (*)    All other components within normal limits  LACTIC ACID, PLASMA - Abnormal; Notable for the following:    Lactic Acid, Venous 4.4 (*)    All other components within normal limits  DIGOXIN LEVEL - Abnormal; Notable for the following:    Digoxin Level 0.6 (*)    All other components within normal limits  APTT  PROTIME-INR  OCCULT BLOOD, POC DEVICE  POCT I-STAT TROPONIN I  OCCULT BLOOD X 1 CARD TO LAB, STOOL  I-STAT TROPONIN I   No results found.   1. Dehydration   2. Pancreatitis   3. Nausea and vomiting   4. Hypercalcemia       MDM  Ongoing nausea vomiting with some abdominal pain, further evaluation including, possible imaging, lab work, fluids.  Check digoxin level, INR   ED ECG REPORT   Date: 02/14/2011   Rate: 60  Rhythm: Electronically paced  QRS Axis: left  Intervals: Widened QRS, consistent with paced rhythm  ST/T Wave abnormalities: nonspecific ST/T changes  Conduction Disutrbances:Paced rhythm  Narrative Interpretation:   Old EKG Reviewed: Since 02/09/2011, no significant changes noted  Laboratory data reviewed and shows old black normal findings including lactic acid of 4.4, lipase of 94, urinalysis with ketones, CBC with no significant elevation, cooperative metabolic panel showing hypercalcemia. IV fluids given, CT scan ordered to further evaluate pancreas. Patient states he has history of heavy alcohol use but no alcohol within 5 months and denies history of pancreatitis. EKG shows no acute ischemia, troponin is negative, admit for rehydration and nausea vomiting.   I have discussed care with the hospitalist who will admit. Temporary orders given     Vida Roller, MD 02/14/11 1610  Vida Roller, MD 02/14/11 [phone]

## 2011-02-15 ENCOUNTER — Encounter (HOSPITAL_COMMUNITY): Payer: Self-pay | Admitting: Nephrology

## 2011-02-15 ENCOUNTER — Inpatient Hospital Stay (HOSPITAL_COMMUNITY): Payer: PRIVATE HEALTH INSURANCE

## 2011-02-15 DIAGNOSIS — N179 Acute kidney failure, unspecified: Secondary | ICD-10-CM | POA: Diagnosis present

## 2011-02-15 DIAGNOSIS — K56609 Unspecified intestinal obstruction, unspecified as to partial versus complete obstruction: Secondary | ICD-10-CM | POA: Diagnosis present

## 2011-02-15 DIAGNOSIS — N390 Urinary tract infection, site not specified: Secondary | ICD-10-CM | POA: Diagnosis present

## 2011-02-15 LAB — BASIC METABOLIC PANEL
Chloride: 100 mEq/L (ref 96–112)
GFR calc Af Amer: 21 mL/min — ABNORMAL LOW (ref >90)
Potassium: 4.6 mEq/L (ref 3.5–5.1)

## 2011-02-15 LAB — CBC
MCH: 32.5 pg (ref 26.0–34.0)
MCHC: 34.4 g/dL (ref 30.0–36.0)
MCV: 94.4 fL (ref 78.0–100.0)
Platelets: 180 10*3/uL (ref 150–400)
RBC: 3.6 MIL/uL — ABNORMAL LOW (ref 4.22–5.81)
RDW: 14.8 % (ref 11.5–15.5)

## 2011-02-15 LAB — COMPREHENSIVE METABOLIC PANEL
ALT: 13 U/L (ref 0–53)
AST: 17 U/L (ref 0–37)
Albumin: 4.1 g/dL (ref 3.5–5.2)
Alkaline Phosphatase: 86 U/L (ref 39–117)
Chloride: 92 mEq/L — ABNORMAL LOW (ref 96–112)
Potassium: 4.4 mEq/L (ref 3.5–5.1)
Total Bilirubin: 0.7 mg/dL (ref 0.3–1.2)

## 2011-02-15 MED ORDER — CIPROFLOXACIN IN D5W 400 MG/200ML IV SOLN
400.0000 mg | INTRAVENOUS | Status: DC
  Administered 2011-02-16 – 2011-02-17 (×2): 400 mg via INTRAVENOUS
  Filled 2011-02-15 (×2): qty 200

## 2011-02-15 MED ORDER — SODIUM CHLORIDE 0.9 % IV BOLUS (SEPSIS)
250.0000 mL | Freq: Once | INTRAVENOUS | Status: AC
  Administered 2011-02-15: 250 mL via INTRAVENOUS

## 2011-02-15 MED ORDER — SODIUM CHLORIDE 0.9 % IV SOLN
INTRAVENOUS | Status: DC
  Administered 2011-02-15 – 2011-02-20 (×10): via INTRAVENOUS

## 2011-02-15 NOTE — Progress Notes (Addendum)
PCP: Pearson Grippe, MD, MD  Brief narrative: 77 year-old male with history of CAD status post CABG, hysterectomy fibrillation on Coumadin, history of heart block and cardiomyopathy on pacemaker and defibrillator, who was recently in the hospital for syncope felt to be secondary to vasovagal episode presented the ER because of persistent abdominal pain since the day before admission. Along with it he had multiple episodes of nausea and vomiting but denied any diarrhea. The pain is located mostly in the epigastric and runs across his upper part of the abdomen. Patient states he was not able to eat anything.  He has since been found to have a small bowel obstruction on CT. Surgery is following. NG tube in place.   Past medical history: Diabetes mellitus, Hypertension, CHF (congestive heart failure), CVA (cerebral vascular accident), MI (myocardial infarction), Pacemaker, S/P CABG (coronary artery bypass graft), Gout, Nephrolithiasis,  Lung cancer, Colon cancer, Coronary artery disease, Prostate cancer.   Consultants: [facility]  Procedures: NG tube placed 12/5   Subjective: Patient feels some better compared to yesterday. But still with abdominal pain. No nausea/vomiting. Passing urine. Passing flatus.  Objective: Vital signs in last 24 hours: Temp:  [97.7 F (36.5 C)-98.3 F (36.8 C)] 97.7 F (36.5 C) (12/06 0545) Pulse Rate:  [57-64] 64  (12/06 0545) Resp:  [18-20] 18  (12/06 0545) BP: (145-158)/(74-89) 154/83 mmHg (12/06 0545) SpO2:  [97 %-99 %] 98 % (12/06 0545) Weight:  [65 kg (143 lb 4.8 oz)] 143 lb 4.8 oz (65 kg) (12/06 0545) Weight change: 0.3 kg (10.6 oz) Last BM Date: 02/13/11  Intake/Output from previous day: 12/05 0701 - 12/06 0700 In: 700 [IV Piggyback:700] Out: 1350 [Urine:200; Emesis/NG output:1150] Intake/Output this shift:    General appearance: alert, cooperative, appears stated age and no distress Head: Normocephalic, without obvious abnormality, atraumatic Eyes:  conjunctivae/corneas clear. PERRL, EOM's intact. Fundi benign. Throat: lips, mucosa, and tongue normal; teeth and gums normal Resp: clear to auscultation bilaterally Cardio: regular rate and rhythm, S1, S2 normal, no murmur, click, rub or gallop GI: abnormal findings:  distended, hypoactive bowel sounds and moderate tenderness in the entire abdomen and no rebound, rigidity. No masses or organomegaly. Extremities: extremities normal, atraumatic, no cyanosis or edema Pulses: 2+ and symmetric Skin: Skin color, texture, turgor normal. No rashes or lesions Lymph nodes: Cervical, supraclavicular, and axillary nodes normal. Neurologic: Alert and oriented X 3, normal strength and tone. Normal symmetric reflexes. Normal coordination and gait  Lab Results:  [facility] 02/15/11 0610 02/14/11 0105  WBC 12.0* 10.7*  HGB 11.7* 13.2  HCT 34.0* 37.7*  PLT 180 184   BMET  Basename 02/13/11 2236  NA 137  K 4.6  CL 94*  CO2 24  GLUCOSE 197*  BUN 25*  CREATININE 1.45*  CALCIUM 12.8*    Studies/Results: Ct Abdomen Pelvis Wo Contrast  02/14/2011  *RADIOLOGY REPORT*  Clinical Data: Vomiting, right lower quadrant abdominal pain, and epigastric pain.  Elevated lactic acid.  CT ABDOMEN AND PELVIS WITHOUT CONTRAST  Technique:  Multidetector CT imaging of the abdomen and pelvis was performed following the standard protocol without intravenous contrast.  Comparison: CT of the abdomen and pelvis performed 11/28/2010, and renal ultrasound performed 12/14/2010  Findings: Mild scarring is noted at the lung bases, with chronic associated lung changes seen.  Unusual focal scarring is noted along the fissures, without definite evidence of a mass.  Diffuse coronary artery calcifications are seen.  The patient is status post median sternotomy; pacemaker/AICD leads are partially imaged. The  distal esophagus is filled with fluid.  The liver and spleen are unremarkable in appearance.  The gallbladder is within normal limits.   The pancreas and adrenal glands are unremarkable.  A 3.8 cm cyst is again noted arising from the lower pole of the left kidney; this is slightly higher than simple fluid attenuation, but was noted to reflect a cyst on the prior ultrasound.  It is also relatively stable from the prior CT.  There is a 0.7 cm stone noted at the lower pole of the right kidney, also stable in appearance.  Mild nonspecific perinephric stranding is noted bilaterally.  There is no evidence of hydronephrosis.  No obstructing ureteral stones are identified.  There is significant distension of the stomach with fluid.  The proximal small bowel is diffusely distended, measuring up to 5.3 cm in diameter, to the level of the proximal ileum at the left mid abdomen.  There is gradual fecalization within the proximal ileum, with distended fecalized loops ending at a transition point at the left mid abdomen.  If the patient has a history of surgery, this most likely reflects an adhesion.  The degree of fecalization suggests underlying small bowel dysmotility and incomplete obstruction, though the more distal small bowel loops are diffusely decompressed, and this may have gradually evolved from a partial to a high-grade small bowel obstruction.  No free fluid is identified.  No acute vascular abnormalities are seen.  Diffuse calcification is noted along the abdominal aorta and its branches, particularly prominent at the origins of the superior mesenteric artery and both renal arteries.  The appendix is normal in caliber, without evidence for appendicitis.  The colon is largely decompressed, also concerning for high-grade small bowel obstruction.  Diverticulosis is noted along the proximal sigmoid colon, without evidence of diverticulitis.  The bladder is mildly distended and grossly unremarkable in appearance.  Postoperative change is noted adjacent to the prostate.  The prostate remains normal in size.  No inguinal lymphadenopathy is seen.  No acute  osseous abnormalities are identified.  A stable apparent compression deformity at L4 may reflect a very large Schmorl's node.  IMPRESSION:  1.  Apparent relatively high-grade small bowel obstruction noted at the proximal ileum, with diffuse distension of proximal small bowel loops to 5.3 cm in diameter, and a transition point at the left mid abdomen.  If the patient has a history of surgery, this most likely reflects an adhesion.  Fecalization of the proximal small bowel just before the transition point suggests underlying small bowel dysmotility and incomplete obstruction, though the more distal small bowel loops are diffusely decompressed; this may have gradually evolved from a partial to a high-grade small bowel obstruction. 2.  Significant distension of the stomach with fluid; the distal esophagus is also filled with fluid.  The patient's symptoms may be partially relieved with a nasogastric tube, as deemed clinically appropriate. 3.  Left renal cyst and right renal stone, stable in appearance. 4.  No evidence for pancreatitis. 5.  Diverticulosis along the proximal sigmoid colon, without evidence of diverticulitis. 6.  Diffuse calcification along the abdominal aorta and its branches, particularly prominent at the origins of the superior mesenteric artery and both renal arteries. 7.  Unusual pattern of scarring at the lung bases, without definite evidence of a mass. 8.  Diffuse coronary artery calcifications seen. 9.  Stable apparent compression deformity at L4 may reflect a very large Schmorl's node.  Findings were discussed with Dr. Eber Hong at 05:14 a.m. on 02/14/2011.  Original Report Authenticated By: Tonia Ghent, M.D.    Medications:  Scheduled:    . sodium chloride   Intravenous STAT  . ciprofloxacin  400 mg Intravenous Q12H  . heparin subcutaneous  5,000 Units Subcutaneous Q8H  . metoprolol  5 mg Intravenous Q6H  . pantoprazole (PROTONIX) IV  40 mg Intravenous Q24H  . DISCONTD:  allopurinol  300 mg Oral Daily  . DISCONTD: digoxin  125 mcg Oral Daily  . DISCONTD: isosorbide mononitrate  30 mg Oral Daily  . DISCONTD: metoprolol  50 mg Oral Daily  . DISCONTD: metronidazole  500 mg Intravenous Q8H  . DISCONTD: rosuvastatin  40 mg Oral Daily  . DISCONTD: warfarin  5 mg Oral ONCE-1800    Principal Problem:  *SBO (small bowel obstruction) Active Problems:  Atrial fibrillation  CHF  AUTOMATIC IMPLANTABLE CARDIAC DEFIBRILLATOR SITU  ARF (acute renal failure)  UTI (lower urinary tract infection)  Hypercalcemia   Assessment/Plan: #1. Small Bowel Obstruction: NG tube draining enteric contents. Surgery following. Repeat films today. Etiology unclear. Can discontinue flagyl.  #2. Hypercalcemia: On IV fluids. Repeat labs pending today.  #3. Mild Acute Renal insufficiency: On IVF. Labs pending.  #4. History of alcoholism: quit completely 6 months ago.   #5. History of prostate cancer status post recent seeding.   #6. CAD status post CABG: Stable.  #7. History of atrial fibrillation on Coumadin at home: Currently off anticoagulation. On IV beta blockers. Rate well controlled.  #8. History of cardiomyopathy (Compensated Chronic Systolic) status post defibrillator and pacemaker placement: EF known to be about 25% based on Dr. Vern Claude note from 12/13/10. Be cautious with IVF.  #9. Possible UTI: Urine cultures pending. Continue Cipro. Discontinue flagyl.  #10. Mild Anemia: Monitor for now. No overt bleeding.  DVt prophylaxis with Heparin.  Full Code.   LOS: 2 days   [facility] Pager [phone] 02/15/2011, 7:56 AM   BMET Reviewed: Significant rise in BUN and Creatinine. Probably prerenal. Patient however is making urine. No hydronephrosis seen on CT. Will increase IVF fluids. Recheck BMET this afternoon. Will involve nephrology due to known history of CHF. Will monitor closely.

## 2011-02-15 NOTE — Progress Notes (Signed)
  Subjective: Pt feeling better. Decreased pain. States he's passed some flatus. No BM.   Objective: Vital signs in last 24 hours: Temp:  [97.7 F (36.5 C)-98.3 F (36.8 C)] 97.7 F (36.5 C) (12/06 0545) Pulse Rate:  [57-64] 64  (12/06 0545) Resp:  [18-20] 18  (12/06 0545) BP: (145-158)/(74-89) 154/83 mmHg (12/06 0545) SpO2:  [97 %-99 %] 98 % (12/06 0545) Weight:  [143 lb 4.8 oz (65 kg)] 143 lb 4.8 oz (65 kg) (12/06 0545) Last BM Date: 02/13/11  Intake/Output this shift:    Physical Exam: BP 154/83  Pulse 64  Temp(Src) 97.7 F (36.5 C) (Oral)  Resp 18  Ht 5\' 6"  (1.676 m)  Wt 143 lb 4.8 oz (65 kg)  BMI 23.13 kg/m2  SpO2 98% Abdomen:softer and less distended. Few BS. Mildly tender (R)mid abd, less than yesterday. NG output still somewhat bilious.  Labs: CBC  Basename 02/15/11 0610 02/14/11 0105  WBC 12.0* 10.7*  HGB 11.7* 13.2  HCT 34.0* 37.7*  PLT 180 184   BMET  Basename 02/15/11 0610 02/13/11 2236  NA 138 137  K 4.4 4.6  CL 92* 94*  CO2 26 24  GLUCOSE 130* 197*  BUN 71* 25*  CREATININE 3.42* 1.45*  CALCIUM 10.4 12.8*   LFT  Basename 02/15/11 0610  PROT 8.5*  ALBUMIN 4.1  AST 17  ALT 13  ALKPHOS 86  BILITOT 0.7  BILIDIR --  IBILI --  LIPASE 137*   PT/INR  Basename 02/15/11 0610 02/14/11 0105  LABPROT 17.6* 14.8  INR 1.42 1.14    Lactate Down to 2.1   Abd 2 Views  02/15/2011  *RADIOLOGY REPORT*  Clinical Data: Lower abdominal pain  ABDOMEN - 2 VIEW  Comparison: CT scan 02/14/2011  Findings: Degenerative changes of the lumbar spine are noted.  Mild distended small bowel loops mid abdomen with multiple air-fluid levels suspicious for bowel obstruction. Again noted calcification lower pole region of the right kidney measures about 7 mm.  IMPRESSION: Distended small bowel loops mid abdomen with multiple air-fluid levels suspicious for bowel obstruction.  Again noted right renal calcification measures 7 mm.  Original Report Authenticated By:  Natasha Mead, M.D.    Assessment: Principal Problem:  *SBO (small bowel obstruction) Active Problems:  Atrial fibrillation  CHF  AUTOMATIC IMPLANTABLE CARDIAC DEFIBRILLATOR SITU  ARF (acute renal failure)  UTI (lower urinary tract infection)  Hypercalcemia  Plan: Lactate down, clinicaslly better. X-rays about same, some gas noted in colon. Encourage OOB/Activity.  LOS: 2 days    Marianna Fuss 02/15/2011

## 2011-02-15 NOTE — Progress Notes (Signed)
Improving Patient examined and I agree with the assessment and plan  Airik Goodlin E

## 2011-02-15 NOTE — Consult Note (Signed)
Reason for Consult:ARF Referring Physician: [name] is an 77 y.o. male.  HPI: 77 year old male with admit for SBO, N & V x 24 h PTA.  Hx Stage 3 CKD with Cr 1.4 - 1.6.  Now 3.42.  No urine recorded most or yest,  And 400cc now in past 24 hours.  Large vol NG.  On Lisinopril and had high Ca, lactic acidosis on admit.  Past U/S with small shrunken kidneys.  ASA allergy , no NSAIDs.  Hx of one stone over 30 yr ago, no UTI's.  No FH of renal disease. No other inherited family defects.   Hx DM> 30 yrs, does not know of HTN but on meds. Hx Lung CA over 30 yrs ago,no tx. 24 h PTA had seed implants for Prostate Ca.  Hx CAD, CHF, Gout, Pacer, CVA (he denies).    Past Medical History  Diagnosis Date  . Diabetes mellitus   . Hypertension   . CHF (congestive heart failure)   . CVA (cerebral vascular accident)   . MI (myocardial infarction)   . Pacemaker   . S/P CABG (coronary artery bypass graft)   . Gout   . Nephrolithiasis   . Lung cancer   . Colon cancer   . Coronary artery disease     Past Surgical History  Procedure Date  . Colonoscopy   . Upper endoscpopy   . Icd insertion     status post BIV  . Coronary artery bypass graft     Family History  Problem Relation Age of Onset  . Alzheimer's disease Father 6  . Cancer Mother 23    metastatic cancer  . Diabetes Sister 64  . Diabetes Brother   . Diabetes Brother   . Diabetes Brother     Social History:  reports that he has never smoked. He does not have any smokeless tobacco history on file. He reports that he does not drink alcohol. His drug history not on file.  Allergies:  Allergies  Allergen Reactions  . Aspirin     Medications: I have reviewed the patient's current medications.   Results for orders placed during the hospital encounter of 02/13/11 (from the past 48 hour(s))  CBC     Status: Abnormal   Collection Time   02/13/11 10:36 PM      Component Value Range Comment   WBC 10.2  4.0 - 10.5  (K/uL)    RBC 3.82 (*) 4.22 - 5.81 (MIL/uL)    Hemoglobin 12.7 (*) 13.0 - 17.0 (g/dL)    HCT 84.1 (*) 32.4 - 52.0 (%)    MCV 94.5  78.0 - 100.0 (fL)    MCH 33.2  26.0 - 34.0 (pg)    MCHC 35.2  30.0 - 36.0 (g/dL)    RDW 40.1  02.7 - 25.3 (%)    Platelets 183  150 - 400 (K/uL)   DIFFERENTIAL     Status: Abnormal   Collection Time   02/13/11 10:36 PM      Component Value Range Comment   Neutrophils Relative 84 (*) 43 - 77 (%)    Neutro Abs 8.6 (*) 1.7 - 7.7 (K/uL)    Lymphocytes Relative 9 (*) 12 - 46 (%)    Lymphs Abs 0.9  0.7 - 4.0 (K/uL)    Monocytes Relative 7  3 - 12 (%)    Monocytes Absolute 0.7  0.1 - 1.0 (K/uL)    Eosinophils Relative 0  0 -  5 (%)    Eosinophils Absolute 0.0  0.0 - 0.7 (K/uL)    Basophils Relative 0  0 - 1 (%)    Basophils Absolute 0.0  0.0 - 0.1 (K/uL)   COMPREHENSIVE METABOLIC PANEL     Status: Abnormal   Collection Time   02/13/11 10:36 PM      Component Value Range Comment   Sodium 137  135 - 145 (mEq/L)    Potassium 4.6  3.5 - 5.1 (mEq/L)    Chloride 94 (*) 96 - 112 (mEq/L)    CO2 24  19 - 32 (mEq/L)    Glucose, Bld 197 (*) 70 - 99 (mg/dL)    BUN 25 (*) 6 - 23 (mg/dL)    Creatinine, Ser 1.61 (*) 0.50 - 1.35 (mg/dL)    Calcium 09.6 (*) 8.4 - 10.5 (mg/dL)    Total Protein 9.5 (*) 6.0 - 8.3 (g/dL)    Albumin 4.9  3.5 - 5.2 (g/dL)    AST 20  0 - 37 (U/L)    ALT 19  0 - 53 (U/L)    Alkaline Phosphatase 107  39 - 117 (U/L)    Total Bilirubin 1.0  0.3 - 1.2 (mg/dL)    GFR calc non Af Amer 45 (*) >90 (mL/min)    GFR calc Af Amer 52 (*) >90 (mL/min)   URINALYSIS, ROUTINE W REFLEX MICROSCOPIC     Status: Abnormal   Collection Time   02/14/11 12:00 AM      Component Value Range Comment   Color, Urine AMBER (*) YELLOW  BIOCHEMICALS MAY BE AFFECTED BY COLOR   APPearance CLOUDY (*) CLEAR     Specific Gravity, Urine 1.026  1.005 - 1.030     pH 5.0  5.0 - 8.0     Glucose, UA NEGATIVE  NEGATIVE (mg/dL)    Hgb urine dipstick NEGATIVE  NEGATIVE     Bilirubin  Urine SMALL (*) NEGATIVE     Ketones, ur 15 (*) NEGATIVE (mg/dL)    Protein, ur 045 (*) NEGATIVE (mg/dL)    Urobilinogen, UA 0.2  0.0 - 1.0 (mg/dL)    Nitrite NEGATIVE  NEGATIVE     Leukocytes, UA SMALL (*) NEGATIVE    URINE MICROSCOPIC-ADD ON     Status: Abnormal   Collection Time   02/14/11 12:00 AM      Component Value Range Comment   Squamous Epithelial / LPF FEW (*) RARE     WBC, UA 3-6  <3 (WBC/hpf)    Bacteria, UA FEW (*) RARE     Casts HYALINE CASTS (*) NEGATIVE    OCCULT BLOOD, POC DEVICE     Status: Normal   Collection Time   02/14/11 12:36 AM      Component Value Range Comment   Fecal Occult Bld NEGATIVE     LIPASE, BLOOD     Status: Abnormal   Collection Time   02/14/11  1:05 AM      Component Value Range Comment   Lipase 94 (*) 11 - 59 (U/L)   CBC     Status: Abnormal   Collection Time   02/14/11  1:05 AM      Component Value Range Comment   WBC 10.7 (*) 4.0 - 10.5 (K/uL)    RBC 3.97 (*) 4.22 - 5.81 (MIL/uL)    Hemoglobin 13.2  13.0 - 17.0 (g/dL)    HCT 40.9 (*) 81.1 - 52.0 (%)    MCV 95.0  78.0 - 100.0 (fL)  MCH 33.2  26.0 - 34.0 (pg)    MCHC 35.0  30.0 - 36.0 (g/dL)    RDW 04.5  40.9 - 81.1 (%)    Platelets 184  150 - 400 (K/uL)   APTT     Status: Normal   Collection Time   02/14/11  1:05 AM      Component Value Range Comment   aPTT 31  24 - 37 (seconds)   PROTIME-INR     Status: Normal   Collection Time   02/14/11  1:05 AM      Component Value Range Comment   Prothrombin Time 14.8  11.6 - 15.2 (seconds)    INR 1.14  0.00 - 1.49    DIGOXIN LEVEL     Status: Abnormal   Collection Time   02/14/11  1:05 AM      Component Value Range Comment   Digoxin Level 0.6 (*) 0.8 - 2.0 (ng/mL)   LACTIC ACID, PLASMA     Status: Abnormal   Collection Time   02/14/11  1:06 AM      Component Value Range Comment   Lactic Acid, Venous 4.4 (*) 0.5 - 2.2 (mmol/L)   POCT I-STAT TROPONIN I     Status: Normal   Collection Time   02/14/11  1:17 AM      Component Value Range  Comment   Troponin i, poc 0.02  0.00 - 0.08 (ng/mL)    Comment 3            CARDIAC PANEL(CRET KIN+CKTOT+MB+TROPI)     Status: Normal   Collection Time   02/14/11  6:28 AM      Component Value Range Comment   Total CK 44  7 - 232 (U/L)    CK, MB 1.6  0.3 - 4.0 (ng/mL)    Troponin I <0.30  <0.30 (ng/mL)    Relative Index RELATIVE INDEX IS INVALID  0.0 - 2.5    PARATHYROID HORMONE, INTACT (NO CA)     Status: Abnormal   Collection Time   02/14/11  6:28 AM      Component Value Range Comment   PTH 9.4 (*) 14.0 - 72.0 (pg/mL) Result repeated and verified.  VITAMIN D 25 HYDROXY     Status: Normal   Collection Time   02/14/11  6:28 AM      Component Value Range Comment   Vit D, 25-Hydroxy 57  30 - 89 (ng/mL)   MRSA PCR SCREENING     Status: Normal   Collection Time   02/14/11  6:36 AM      Component Value Range Comment   MRSA by PCR NEGATIVE  NEGATIVE    GLUCOSE, CAPILLARY     Status: Abnormal   Collection Time   02/14/11  6:38 AM      Component Value Range Comment   Glucose-Capillary 183 (*) 70 - 99 (mg/dL)   CARDIAC PANEL(CRET KIN+CKTOT+MB+TROPI)     Status: Normal   Collection Time   02/14/11  1:02 PM      Component Value Range Comment   Total CK 43  7 - 232 (U/L)    CK, MB 1.8  0.3 - 4.0 (ng/mL)    Troponin I <0.30  <0.30 (ng/mL)    Relative Index RELATIVE INDEX IS INVALID  0.0 - 2.5    CARDIAC PANEL(CRET KIN+CKTOT+MB+TROPI)     Status: Normal   Collection Time   02/14/11  9:22 PM      Component Value Range Comment  Total CK 53  7 - 232 (U/L)    CK, MB 1.8  0.3 - 4.0 (ng/mL)    Troponin I <0.30  <0.30 (ng/mL)    Relative Index RELATIVE INDEX IS INVALID  0.0 - 2.5    COMPREHENSIVE METABOLIC PANEL     Status: Abnormal   Collection Time   02/15/11  6:10 AM      Component Value Range Comment   Sodium 138  135 - 145 (mEq/L)    Potassium 4.4  3.5 - 5.1 (mEq/L)    Chloride 92 (*) 96 - 112 (mEq/L)    CO2 26  19 - 32 (mEq/L)    Glucose, Bld 130 (*) 70 - 99 (mg/dL)    BUN 71 (*) 6  - 23 (mg/dL) DELTA CHECK NOTED   Creatinine, Ser 3.42 (*) 0.50 - 1.35 (mg/dL) DELTA CHECK NOTED   Calcium 10.4  8.4 - 10.5 (mg/dL)    Total Protein 8.5 (*) 6.0 - 8.3 (g/dL)    Albumin 4.1  3.5 - 5.2 (g/dL)    AST 17  0 - 37 (U/L)    ALT 13  0 - 53 (U/L)    Alkaline Phosphatase 86  39 - 117 (U/L)    Total Bilirubin 0.7  0.3 - 1.2 (mg/dL)    GFR calc non Af Amer 16 (*) >90 (mL/min)    GFR calc Af Amer 19 (*) >90 (mL/min)   CBC     Status: Abnormal   Collection Time   02/15/11  6:10 AM      Component Value Range Comment   WBC 12.0 (*) 4.0 - 10.5 (K/uL)    RBC 3.60 (*) 4.22 - 5.81 (MIL/uL)    Hemoglobin 11.7 (*) 13.0 - 17.0 (g/dL)    HCT 40.9 (*) 81.1 - 52.0 (%)    MCV 94.4  78.0 - 100.0 (fL)    MCH 32.5  26.0 - 34.0 (pg)    MCHC 34.4  30.0 - 36.0 (g/dL)    RDW 91.4  78.2 - 95.6 (%)    Platelets 180  150 - 400 (K/uL)   MAGNESIUM     Status: Normal   Collection Time   02/15/11  6:10 AM      Component Value Range Comment   Magnesium 2.0  1.5 - 2.5 (mg/dL)   LIPASE, BLOOD     Status: Abnormal   Collection Time   02/15/11  6:10 AM      Component Value Range Comment   Lipase 137 (*) 11 - 59 (U/L)   PROTIME-INR     Status: Abnormal   Collection Time   02/15/11  6:10 AM      Component Value Range Comment   Prothrombin Time 17.6 (*) 11.6 - 15.2 (seconds)    INR 1.42  0.00 - 1.49    LACTIC ACID, PLASMA     Status: Normal   Collection Time   02/15/11  6:30 AM      Component Value Range Comment   Lactic Acid, Venous 2.1  0.5 - 2.2 (mmol/L)     Ct Abdomen Pelvis Wo Contrast  02/14/2011  *RADIOLOGY REPORT*  Clinical Data: Vomiting, right lower quadrant abdominal pain, and epigastric pain.  Elevated lactic acid.  CT ABDOMEN AND PELVIS WITHOUT CONTRAST  Technique:  Multidetector CT imaging of the abdomen and pelvis was performed following the standard protocol without intravenous contrast.  Comparison: CT of the abdomen and pelvis performed 11/28/2010, and renal ultrasound performed  12/14/2010  Findings:  Mild scarring is noted at the lung bases, with chronic associated lung changes seen.  Unusual focal scarring is noted along the fissures, without definite evidence of a mass.  Diffuse coronary artery calcifications are seen.  The patient is status post median sternotomy; pacemaker/AICD leads are partially imaged. The distal esophagus is filled with fluid.  The liver and spleen are unremarkable in appearance.  The gallbladder is within normal limits.  The pancreas and adrenal glands are unremarkable.  A 3.8 cm cyst is again noted arising from the lower pole of the left kidney; this is slightly higher than simple fluid attenuation, but was noted to reflect a cyst on the prior ultrasound.  It is also relatively stable from the prior CT.  There is a 0.7 cm stone noted at the lower pole of the right kidney, also stable in appearance.  Mild nonspecific perinephric stranding is noted bilaterally.  There is no evidence of hydronephrosis.  No obstructing ureteral stones are identified.  There is significant distension of the stomach with fluid.  The proximal small bowel is diffusely distended, measuring up to 5.3 cm in diameter, to the level of the proximal ileum at the left mid abdomen.  There is gradual fecalization within the proximal ileum, with distended fecalized loops ending at a transition point at the left mid abdomen.  If the patient has a history of surgery, this most likely reflects an adhesion.  The degree of fecalization suggests underlying small bowel dysmotility and incomplete obstruction, though the more distal small bowel loops are diffusely decompressed, and this may have gradually evolved from a partial to a high-grade small bowel obstruction.  No free fluid is identified.  No acute vascular abnormalities are seen.  Diffuse calcification is noted along the abdominal aorta and its branches, particularly prominent at the origins of the superior mesenteric artery and both renal  arteries.  The appendix is normal in caliber, without evidence for appendicitis.  The colon is largely decompressed, also concerning for high-grade small bowel obstruction.  Diverticulosis is noted along the proximal sigmoid colon, without evidence of diverticulitis.  The bladder is mildly distended and grossly unremarkable in appearance.  Postoperative change is noted adjacent to the prostate.  The prostate remains normal in size.  No inguinal lymphadenopathy is seen.  No acute osseous abnormalities are identified.  A stable apparent compression deformity at L4 may reflect a very large Schmorl's node.  IMPRESSION:  1.  Apparent relatively high-grade small bowel obstruction noted at the proximal ileum, with diffuse distension of proximal small bowel loops to 5.3 cm in diameter, and a transition point at the left mid abdomen.  If the patient has a history of surgery, this most likely reflects an adhesion.  Fecalization of the proximal small bowel just before the transition point suggests underlying small bowel dysmotility and incomplete obstruction, though the more distal small bowel loops are diffusely decompressed; this may have gradually evolved from a partial to a high-grade small bowel obstruction. 2.  Significant distension of the stomach with fluid; the distal esophagus is also filled with fluid.  The patient's symptoms may be partially relieved with a nasogastric tube, as deemed clinically appropriate. 3.  Left renal cyst and right renal stone, stable in appearance. 4.  No evidence for pancreatitis. 5.  Diverticulosis along the proximal sigmoid colon, without evidence of diverticulitis. 6.  Diffuse calcification along the abdominal aorta and its branches, particularly prominent at the origins of the superior mesenteric artery and both renal arteries. 7.  Unusual pattern of scarring at the lung bases, without definite evidence of a mass. 8.  Diffuse coronary artery calcifications seen. 9.  Stable apparent  compression deformity at L4 may reflect a very large Schmorl's node.  Findings were discussed with Dr. Eber Hong at 05:14 a.m. on 02/14/2011.  Original Report Authenticated By: Tonia Ghent, M.D.   Dg Abd 2 Views  02/15/2011  *RADIOLOGY REPORT*  Clinical Data: Lower abdominal pain  ABDOMEN - 2 VIEW  Comparison: CT scan 02/14/2011  Findings: Degenerative changes of the lumbar spine are noted.  Mild distended small bowel loops mid abdomen with multiple air-fluid levels suspicious for bowel obstruction. Again noted calcification lower pole region of the right kidney measures about 7 mm.  IMPRESSION: Distended small bowel loops mid abdomen with multiple air-fluid levels suspicious for bowel obstruction.  Again noted right renal calcification measures 7 mm.  Original Report Authenticated By: Natasha Mead, M.D.    @ROS @ Blood pressure 156/82, pulse 65, temperature 97.7 F (36.5 C), temperature source Oral, resp. rate 19, height 5\' 6"  (1.676 m), weight 65 kg (143 lb 4.8 oz), SpO2 95.00%. @PHYSEXAMBYAGE2 @ Physical Examination: General appearance - alert, well appearing, and in no distress, oriented to person, place, and time, chronically ill appearing and pale Mental status - alert, oriented to person, place, and time, poor memory Eyes - pupils equal and reactive, extraocular eye movements intact, funduscopic exam normal, discs flat and sharp Lymphatics - posterior cervical nodes Chest - clear to auscultation, no wheezes, rales or rhonchi, symmetric air entry, decreased air entry noted bilat Heart - normal rate and regular rhythm, systolic murmur 3/6 holosys, diastolic murmur 2/6 at 2nd left intercostal space Abdomen - tenderness noted diffusely hepatomegaly 2 cm down no abdominal bruits Mod distension Extremities - no pedal edema noted, peripheral pulses abnormal decreased DP, no edema, redness or tenderness in the calves or thighs Skin - very pale  Assessment/Plan: 1 AKI with CKD.  Chronic  probalbly nephrosclerosis.  AKI secondary to hemodyanmic injury with vol decrease, evidenced by lactic acidosis, and his ^ Ca.  Acid/base and K ok.  Vol ok.  Would cont with hydration cautiously with his cardiac hx.  Need to eval for AIN from AB and look at Uric acid 2 SBO needs fluids 3 Hypertension: not an issue, avoid ACEI in future 4. Prostate Ca ? contibuted to problem with therapy 5. Arrhythmias ^ risk for complications 6  Dm needs control 7 Hx Gout check UA 8 Hx Lung Ca ?  P as above, U/S, UA, Uric Acid, PTH, IVF, I & O  Adric Wrede L 02/15/2011, 3:44 PM

## 2011-02-15 NOTE — Progress Notes (Signed)
ANTIBIOTIC CONSULT NOTE - FOLLOW UP  Pharmacy Consult for Cipro Indication: SBO  Allergies  Allergen Reactions  . Aspirin     Patient Measurements: Height: 5\' 6"  (167.6 cm) Weight: 143 lb 4.8 oz (65 kg) (bedscale [place]) IBW/kg (Calculated) : 63.8   Vital Signs: Temp: 97.7 F (36.5 C) (12/06 0545) BP: 154/83 mmHg (12/06 0545) Pulse Rate: 64  (12/06 0545) Intake/Output from previous day: 12/05 0701 - 12/06 0700 In: 700 [IV Piggyback:700] Out: 1350 [Urine:200; Emesis/NG output:1150] Intake/Output from this shift:    Labs:  [facility] 02/15/11 0610 02/14/11 0105 02/13/11 2236  WBC 12.0* 10.7* 10.2  HGB 11.7* 13.2 12.7*  PLT 180 184 183  LABCREA -- -- --  CREATININE 3.42* -- 1.45*   Estimated Creatinine Clearance: 16.6 ml/min (by C-G formula based on Cr of 3.42).  Microbiology: Recent Results (from the past 720 hour(s))  MRSA PCR SCREENING     Status: Normal   Collection Time   02/14/11  6:36 AM      Component Value Range Status Comment   MRSA by PCR NEGATIVE  NEGATIVE  Final     Anti-infectives     Start     Dose/Rate Route Frequency Ordered Stop   02/14/11 0800   metroNIDAZOLE (FLAGYL) IVPB 500 mg  Status:  Discontinued        500 mg 100 mL/hr over 60 Minutes Intravenous Every 8 hours 02/14/11 0551 02/15/11 0755   02/14/11 0800   ciprofloxacin (CIPRO) IVPB 400 mg        400 mg 200 mL/hr over 60 Minutes Intravenous Every 12 hours 02/14/11 0724            Assessment: SBO: On Day #2 of Cipro empirically.  Will adjust for developing ARF.  Goal of Therapy:  Appropriate antibiotic regimen for renal function and indication.  Plan:  Decrease Cipro to 400mg  IV q24h. Monitor renal function and urine output.  Estella Husk, Pharm.D., BCPS Clinical Pharmacist  Pager [phone] 02/15/2011, 9:04 AM

## 2011-02-15 NOTE — Clinical Documentation Improvement (Signed)
CHF DOCUMENTATION CLARIFICATION QUERY  THIS DOCUMENT IS NOT A PERMANENT PART OF THE MEDICAL RECORD   Please update your documentation within the medical record to reflect your response to this query.                                                                                    02/15/11  Dr Rito Ehrlich and/or Associates,  In a better effort to capture your patient's severity of illness, reflect appropriate length of stay and utilization of resources, a review of the patient medical record has revealed the following regarding the diagnosis of Heart Failure.    Patient has documented Heart Failure, known EF of 25%, s/p AICD placement.  Based on your clinical judgment, in order to provide greater specificty regarding the patient's known heart failure, please document the ACUITY and TYPE of Heart Failure monitored and treated this admission:   ACUITY: - Chronic  AND  TYPE: - Systolic   In responding to this query please exercise your independent judgment.  The fact that a query is asked, does not imply that any particular answer is desired or expected.   Reviewed: additional documentation in the medical record  Thank You,  Jerral Ralph RN BSN Certified Clinical Documentation Specialist: Cell   [phone]  Health Information Management [place]   TO RESPOND TO THE THIS QUERY, FOLLOW THE INSTRUCTIONS BELOW:  1. If needed, update documentation for the patient's encounter via the notes activity.  2. Access this query again and click edit on the [employer].  3. After updating, or not, click F2 to complete all highlighted (required) fields concerning your review. Select "additional documentation in the medical record" OR "no additional documentation provided".  4. Click Sign note button.  5. The deficiency will fall out of your InBasket *Please let us know if you are not able to compete this workflow by phone or e-mail (listed below).

## 2011-02-16 ENCOUNTER — Inpatient Hospital Stay (HOSPITAL_COMMUNITY): Payer: PRIVATE HEALTH INSURANCE

## 2011-02-16 DIAGNOSIS — I5022 Chronic systolic (congestive) heart failure: Secondary | ICD-10-CM | POA: Diagnosis present

## 2011-02-16 LAB — CBC
MCV: 96.5 fL (ref 78.0–100.0)
Platelets: 138 10*3/uL — ABNORMAL LOW (ref 150–400)
RDW: 15 % (ref 11.5–15.5)
WBC: 8 10*3/uL (ref 4.0–10.5)

## 2011-02-16 LAB — COMPREHENSIVE METABOLIC PANEL
BUN: 66 mg/dL — ABNORMAL HIGH (ref 6–23)
CO2: 22 mEq/L (ref 19–32)
Chloride: 114 mEq/L — ABNORMAL HIGH (ref 96–112)
Creatinine, Ser: 2.14 mg/dL — ABNORMAL HIGH (ref 0.50–1.35)
GFR calc non Af Amer: 28 mL/min — ABNORMAL LOW (ref >90)
Total Bilirubin: 0.6 mg/dL (ref 0.3–1.2)

## 2011-02-16 LAB — PROTEIN ELECTROPHORESIS, SERUM
Albumin ELP: 52 % — ABNORMAL LOW (ref 55.8–66.1)
Beta Globulin: 5.9 % (ref 4.7–7.2)
Total Protein ELP: 8.9 g/dL — ABNORMAL HIGH (ref 6.0–8.3)

## 2011-02-16 LAB — URINE CULTURE: Culture: NO GROWTH

## 2011-02-16 LAB — DIFFERENTIAL
Basophils Absolute: 0 10*3/uL (ref 0.0–0.1)
Eosinophils Absolute: 0 10*3/uL (ref 0.0–0.7)
Eosinophils Relative: 0 % (ref 0–5)
Lymphocytes Relative: 11 % — ABNORMAL LOW (ref 12–46)
Lymphs Abs: 0.9 10*3/uL (ref 0.7–4.0)
Monocytes Absolute: 1 10*3/uL (ref 0.1–1.0)

## 2011-02-16 LAB — IRON AND TIBC
Iron: 55 ug/dL (ref 42–135)
Saturation Ratios: 24 % (ref 20–55)

## 2011-02-16 LAB — PARATHYROID HORMONE, INTACT (NO CA): PTH: 96.3 pg/mL — ABNORMAL HIGH (ref 14.0–72.0)

## 2011-02-16 NOTE — Progress Notes (Signed)
PCP: Pearson Grippe, MD, MD  Brief narrative: 77 year-old male with history of CAD status post CABG, hysterectomy fibrillation on Coumadin, history of heart block and cardiomyopathy on pacemaker and defibrillator, who was recently in the hospital for syncope felt to be secondary to vasovagal episode presented the ER because of persistent abdominal pain since the day before admission. Along with it he had multiple episodes of nausea and vomiting but denied any diarrhea. The pain is located mostly in the epigastric and runs across his upper part of the abdomen. Patient states he was not able to eat anything.  He has since been found to have a small bowel obstruction on CT. Surgery is following. NG tube in place. Patient also had worsening renal failure for which nephrology was called.   Past medical history: Diabetes mellitus, Hypertension, CHF (congestive heart failure), CVA (cerebral vascular accident), MI (myocardial infarction), Pacemaker, S/P CABG (coronary artery bypass graft), Gout, Nephrolithiasis,  Lung cancer, Colon cancer, Coronary artery disease, Prostate cancer.   Consultants: CCS, Nephrology  Procedures: NG tube placed 12/5   Subjective: Patient feels better today. Less abdominal pain. Had a BM apparently. No N/V. Requesting water by mouth.  Objective: Vital signs in last 24 hours: Temp:  [97 F (36.1 C)-98 F (36.7 C)] 97 F (36.1 C) (12/07 0557) Pulse Rate:  [63-65] 63  (12/07 0557) Resp:  [18-19] 18  (12/07 0557) BP: (131-156)/(66-82) 131/66 mmHg (12/07 0557) SpO2:  [95 %-98 %] 97 % (12/07 0557) Weight:  [64.638 kg (142 lb 8 oz)] 142 lb 8 oz (64.638 kg) (12/07 0557) Weight change: -0.362 kg (-12.8 oz) Last BM Date: March 09, 2011  Intake/Output from previous day: 2023/03/09 0701 - 12/07 0700 In: 1383.3 [I.V.:1133.3; IV Piggyback:250] Out: 725 [Urine:725] Intake/Output this shift: Total I/O In: 1533.3 [I.V.:1533.3] Out: -   Not much NG output over last 24 hrs.  General  appearance: alert, cooperative, appears stated age and no distress Head: Normocephalic, without obvious abnormality, atraumatic Eyes: conjunctivae/corneas clear. PERRL, EOM's intact. Fundi benign. Resp: clear to auscultation bilaterally Cardio: regular rate and rhythm, S1, S2 normal, no murmur, click, rub or gallop GI: abnormal findings:  Soft. Not distended today. Non tender. Hypoactive bowel sounds. No masses or organomegaly. Extremities: extremities normal, atraumatic, no cyanosis or edema Pulses: 2+ and symmetric Skin: Skin color, texture, turgor normal. No rashes or lesions Neurologic: Alert and oriented X 3, normal strength and tone. Normal symmetric reflexes. Normal coordination and gait  Lab Results:  [facility] 02/16/11 0620 2011/03/09 0610  WBC 8.0 12.0*  HGB 9.3* 11.7*  HCT 27.5* 34.0*  PLT 138* 180   BMET  Basename 02/16/11 0620 2011-03-09 1615  NA 144 140  K 4.2 4.6  CL 114* 100  CO2 22 27  GLUCOSE 86 122*  BUN 66* 79*  CREATININE 2.14* 3.15*  CALCIUM 8.5 9.7    Studies/Results: Dg Abd 2 Views  2011-03-09  *RADIOLOGY REPORT*  Clinical Data: Lower abdominal pain  ABDOMEN - 2 VIEW  Comparison: CT scan 02/14/2011  Findings: Degenerative changes of the lumbar spine are noted.  Mild distended small bowel loops mid abdomen with multiple air-fluid levels suspicious for bowel obstruction. Again noted calcification lower pole region of the right kidney measures about 7 mm.  IMPRESSION: Distended small bowel loops mid abdomen with multiple air-fluid levels suspicious for bowel obstruction.  Again noted right renal calcification measures 7 mm.  Original Report Authenticated By: Natasha Mead, M.D.    Medications:  Scheduled:    . ciprofloxacin  400 mg Intravenous Q24H  . heparin subcutaneous  5,000 Units Subcutaneous Q8H  . metoprolol  5 mg Intravenous Q6H  . pantoprazole (PROTONIX) IV  40 mg Intravenous Q24H  . sodium chloride  250 mL Intravenous Once  . DISCONTD: ciprofloxacin   400 mg Intravenous Q12H    Principal Problem:  *SBO (small bowel obstruction) Active Problems:  Atrial fibrillation  AUTOMATIC IMPLANTABLE CARDIAC DEFIBRILLATOR SITU  ARF (acute renal failure)  UTI (lower urinary tract infection)  Hypercalcemia  Systolic CHF, chronic   Assessment/Plan: #1. Small Bowel Obstruction: Minimal NG tube draining over last 24 hrs. Patient is doing better. Abdomen not distended today. Await surgery input regarding further course of action. Etiology remains unclear.  #2. Hypercalcemia: Resolved. Was high due to dehydration.  #3. Acute Renal insufficiency: Improving with IVF. Appreciate nephrology input.  #4. History of alcoholism: quit completely 6 months ago.   #5. History of prostate cancer status post recent seeding.   #6. CAD status post CABG: Stable.  #7. History of atrial fibrillation on Coumadin at home: Currently off anticoagulation. On IV beta blockers. Rate well controlled. Will resume Warfarin once able take orally.  #8. History of cardiomyopathy (Compensated Chronic Systolic) status post defibrillator and pacemaker placement: EF known to be about 25% based on Dr. Vern Claude note from 12/13/10. Be cautious with IVF. Stable.  #9. Possible UTI: Urine cultures pending. Continue Cipro for now.  #10. Mild Anemia: Drop in hemoglobin is dilutional. Baseline around 9-10. No overt bleeding.  DVT prophylaxis with Heparin.  Full Code.   LOS: 3 days   [facility] Pager [phone] 02/16/2011, 7:57 AM

## 2011-02-16 NOTE — Progress Notes (Signed)
Subjective: Interval History: none.  Objective: Vital signs in last 24 hours:  Temp:  [97 F (36.1 C)-98 F (36.7 C)] 97 F (36.1 C) (12/07 0557) Pulse Rate:  [63-65] 63  (12/07 0557) Resp:  [18-19] 18  (12/07 0557) BP: (131-156)/(66-82) 131/66 mmHg (12/07 0557) SpO2:  [95 %-98 %] 97 % (12/07 0557) Weight:  [64.638 kg (142 lb 8 oz)] 142 lb 8 oz (64.638 kg) (12/07 0557)  Weight change: -0.362 kg (-12.8 oz)  Intake/Output: I/O last 3 completed shifts: In: 2083.3 [I.V.:1133.3; IV Piggyback:950] Out: 925 [Urine:925]   Intake/Output this shift:  Total I/O In: 1533.3 [I.V.:1533.3] Out: -   Mental status - alert, oriented to person, place, and time, poor memory  Chest - clear to auscultation, no wheezes, rales or rhonchi,  Heart - normal rate and regular rhythm, systolic murmur 3/6  Abdomen - tenderness noted diffusely  Extremities - no pedal edema noted Skin -  pale   Lab Results:  [facility] 02/16/11 0620 14-Mar-2011 0610 02/14/11 0105  WBC 8.0 12.0* 10.7*  HGB 9.3* 11.7* 13.2  HCT 27.5* 34.0* 37.7*  PLT 138* 180 184   BMET  Basename 02/16/11 0620 03-14-11 1615 2011/03/14 0610  NA 144 140 138  K 4.2 4.6 4.4  CL 114* 100 92*  CO2 22 27 26   GLUCOSE 86 122* 130*  BUN 66* 79* 71*  CREATININE 2.14* 3.15* 3.42*  CALCIUM 8.5 9.7 10.4  PHOS 3.8 -- --   LFT  Basename 02/16/11 0620  PROT 6.6  ALBUMIN 3.2*  AST 21  ALT 14  ALKPHOS 62  BILITOT 0.6  BILIDIR --  IBILI --   PT/INR  Basename Mar 14, 2011 0610 02/14/11 0105  LABPROT 17.6* 14.8  INR 1.42 1.14   Hepatitis Panel No results found for this basename: HEPBSAG,HCVAB,HEPAIGM,HEPBIGM in the last 72 hours  Studies/Results: Dg Abd 2 Views  March 14, 2011  *RADIOLOGY REPORT*  Clinical Data: Lower abdominal pain  ABDOMEN - 2 VIEW  Comparison: CT scan 02/14/2011  Findings: Degenerative changes of the lumbar spine are noted.  Mild distended small bowel loops mid abdomen with multiple air-fluid levels suspicious for bowel  obstruction. Again noted calcification lower pole region of the right kidney measures about 7 mm.  IMPRESSION: Distended small bowel loops mid abdomen with multiple air-fluid levels suspicious for bowel obstruction.  Again noted right renal calcification measures 7 mm.  Original Report Authenticated By: Natasha Mead, M.D.    I have reviewed the patient's current medications.  Assessment/Plan: 1 AKI with CKD. Chronic probalbly nephrosclerosis. AKI secondary to hemodyanmic injury with vol decrease and hypercalcemia 2 SBO needs fluids  3 Renal function is improving will sign off   LOS: 3 Jeremaih Klima W @TODAY @8 :56 AM

## 2011-02-16 NOTE — Progress Notes (Signed)
Utilization review completed. [name] 02/16/2011 

## 2011-02-16 NOTE — Progress Notes (Signed)
  Subjective: Multiple BM's, no abd pain  Objective: Vital signs in last 24 hours: Temp:  [97 F (36.1 C)-98 F (36.7 C)] 97 F (36.1 C) (12/07 0557) Pulse Rate:  [63-65] 63  (12/07 0557) Resp:  [18-19] 18  (12/07 0557) BP: (131-156)/(66-82) 131/66 mmHg (12/07 0557) SpO2:  [95 %-98 %] 97 % (12/07 0557) Weight:  [64.638 kg (142 lb 8 oz)] 142 lb 8 oz (64.638 kg) (12/07 0557) Last BM Date: 02/16/11  Intake/Output from previous day: 02-26-23 0701 - 12/07 0700 In: 1383.3 [I.V.:1133.3; IV Piggyback:250] Out: 725 [Urine:725] Intake/Output this shift: Total I/O In: 1533.3 [I.V.:1533.3] Out: 201 [Urine:200; Stool:1]  General appearance: alert and cooperative Resp: clear to auscultation bilaterally GI: Soft, NT, +BS, no masses  Lab Results:   [facility] 02/16/11 0620 2011/02/26 0610  WBC 8.0 12.0*  HGB 9.3* 11.7*  HCT 27.5* 34.0*  PLT 138* 180   BMET  Basename 02/16/11 0620 2011-02-26 1615  NA 144 140  K 4.2 4.6  CL 114* 100  CO2 22 27  GLUCOSE 86 122*  BUN 66* 79*  CREATININE 2.14* 3.15*  CALCIUM 8.5 9.7   PT/INR  Basename 02-26-2011 0610 02/14/11 0105  LABPROT 17.6* 14.8  INR 1.42 1.14   ABG No results found for this basename: PHART:2,PCO2:2,PO2:2,HCO3:2 in the last 72 hours  Studies/Results: Dg Abd 2 Views  February 26, 2011  *RADIOLOGY REPORT*  Clinical Data: Lower abdominal pain  ABDOMEN - 2 VIEW  Comparison: CT scan 02/14/2011  Findings: Degenerative changes of the lumbar spine are noted.  Mild distended small bowel loops mid abdomen with multiple air-fluid levels suspicious for bowel obstruction. Again noted calcification lower pole region of the right kidney measures about 7 mm.  IMPRESSION: Distended small bowel loops mid abdomen with multiple air-fluid levels suspicious for bowel obstruction.  Again noted right renal calcification measures 7 mm.  Original Report Authenticated By: Natasha Mead, M.D.    Anti-infectives: Anti-infectives     Start     Dose/Rate Route  Frequency Ordered Stop   02/16/11 0800   ciprofloxacin (CIPRO) IVPB 400 mg        400 mg 200 mL/hr over 60 Minutes Intravenous Every 24 hours February 26, 2011 0906     02/14/11 0800   metroNIDAZOLE (FLAGYL) IVPB 500 mg  Status:  Discontinued        500 mg 100 mL/hr over 60 Minutes Intravenous Every 8 hours 02/14/11 0551 02/26/11 0755   02/14/11 0800   ciprofloxacin (CIPRO) IVPB 400 mg  Status:  Discontinued        400 mg 200 mL/hr over 60 Minutes Intravenous Every 12 hours 02/14/11 0724 02-26-2011 0906          Assessment/Plan: SBO - resolving - D/C NGT and start clears WBC WNL   LOS: 3 days    Jules Baty E 02/16/2011

## 2011-02-17 LAB — BASIC METABOLIC PANEL
GFR calc Af Amer: 66 mL/min — ABNORMAL LOW (ref >90)
GFR calc non Af Amer: 57 mL/min — ABNORMAL LOW (ref >90)
Potassium: 3.5 mEq/L (ref 3.5–5.1)
Sodium: 138 mEq/L (ref 135–145)

## 2011-02-17 LAB — CBC
Hemoglobin: 9 g/dL — ABNORMAL LOW (ref 13.0–17.0)
MCHC: 33.6 g/dL (ref 30.0–36.0)
RBC: 2.81 MIL/uL — ABNORMAL LOW (ref 4.22–5.81)
WBC: 5.3 10*3/uL (ref 4.0–10.5)

## 2011-02-17 NOTE — Progress Notes (Signed)
PCP: Pearson Grippe, MD, MD  Brief narrative: 77 year-old male with history of CAD status post CABG, hysterectomy fibrillation on Coumadin, history of heart block and cardiomyopathy on pacemaker and defibrillator, who was recently in the hospital for syncope felt to be secondary to vasovagal episode presented the ER because of persistent abdominal pain since the day before admission. Along with it he had multiple episodes of nausea and vomiting but denied any diarrhea. The pain is located mostly in the epigastric and runs across his upper part of the abdomen. Patient states he was not able to eat anything.  He has since been found to have a small bowel obstruction on CT. Surgery is following. NG tube removed yesterday. Patient also had worsening renal failure for which nephrology was called. Renal function is improving as well.   Past medical history: Diabetes mellitus, Hypertension, CHF (congestive heart failure), CVA (cerebral vascular accident), MI (myocardial infarction), Pacemaker, S/P CABG (coronary artery bypass graft), Gout, Nephrolithiasis,  Lung cancer, Colon cancer, Coronary artery disease, Prostate cancer.   Consultants: CCS, Nephrology signed off  Procedures: NG tube placed 12/5. Removed 12/7   Subjective: Patient feels much better today. No abdominal pain. Has had multiple BM's which are loose. 2 this morning. No N/V. Tolerating PO.  Objective: Vital signs in last 24 hours: Temp:  [98 F (36.7 C)-98.7 F (37.1 C)] 98.1 F (36.7 C) (12/08 0407) Pulse Rate:  [64-66] 64  (12/08 0407) Resp:  [18-20] 20  (12/08 0407) BP: (125-141)/(70-77) 126/74 mmHg (12/08 0407) SpO2:  [97 %-100 %] 100 % (12/08 0407) Weight:  [68.2 kg (150 lb 5.7 oz)] 150 lb 5.7 oz (68.2 kg) (12/08 0407) Weight change: 3.562 kg (7 lb 13.7 oz) Last BM Date: 02/16/11  Intake/Output from previous day: 12/07 0701 - 12/08 0700 In: 5211.7 [P.O.:720; I.V.:4491.7] Out: 2401 [Urine:2400; Stool:1] Intake/Output  this shift: Total I/O In: 240 [P.O.:240] Out: 300 [Urine:300]  General appearance: alert, cooperative, appears stated age and no distress Resp: clear to auscultation bilaterally Cardio: regular rate and rhythm, S1, S2 normal, no murmur, click, rub or gallop GI: abnormal findings:  Soft. Non distended. Non tender. Normal bowel sounds. No masses or organomegaly. Extremities: extremities normal, atraumatic, no cyanosis or edema Pulses: 2+ and symmetric Neurologic: Alert and oriented X 3, normal strength and tone. Normal symmetric reflexes. Normal coordination and gait  Lab Results:  [facility] 02/17/11 0645 02/16/11 0620  WBC 5.3 8.0  HGB 9.0* 9.3*  HCT 26.8* 27.5*  PLT 126* 138*   BMET  Basename 02/17/11 0645 02/16/11 0620  NA 138 144  K 3.5 4.2  CL 108 114*  CO2 23 22  GLUCOSE 83 86  BUN 26* 66*  CREATININE 1.20 2.14*  CALCIUM 7.9* 8.5    Studies/Results: US Renal  02/16/2011  *RADIOLOGY REPORT*  Clinical Data: Cystic kidney disease.  Acute renal failure.  High blood pressure.  Diabetic.  RENAL/URINARY TRACT ULTRASOUND COMPLETE  Comparison:  02/14/2011 CT.  12/14/2010 ultrasound.  Findings:  Right Kidney:  10.2 cm.  4.3 mm lower pole nonobstructing stone. No hydronephrosis.  Left Kidney:  10.3 cm. Slightly complex left lower pole 3.9 x 3.2 x 3 cm cyst.  No hydronephrosis.  Bladder:  Decompressed without obvious mass.  IMPRESSION: 4.3 mm nonobstructing right lower pole renal calculus.  Left lower pole slightly complex 3.9 cm cystic structure.  Original Report Authenticated By: Fuller Canada, M.D.    Medications:  Scheduled:    . ciprofloxacin  400 mg Intravenous Q24H  .  heparin subcutaneous  5,000 Units Subcutaneous Q8H  . metoprolol  5 mg Intravenous Q6H  . pantoprazole (PROTONIX) IV  40 mg Intravenous Q24H    Principal Problem:  *SBO (small bowel obstruction) Active Problems:  Atrial fibrillation  AUTOMATIC IMPLANTABLE CARDIAC DEFIBRILLATOR SITU  ARF (acute renal  failure)  UTI (lower urinary tract infection)  Systolic CHF, chronic   Assessment/Plan: #1. Small Bowel Obstruction: Patient is doing better. Etiology remains unclear. Diet being advanced. NG taken out yesterday. Needs outpatient colonoscopy.  #2. Hypercalcemia: Resolved. Was high due to dehydration.  #3. Acute Renal insufficiency: Improved with IVF. Appreciate nephrology input. Decrease IVF rate.  #4. History of alcoholism: quit completely 6 months ago.   #5. History of prostate cancer status post recent seeding.   #6. CAD status post CABG: Stable.  #7. History of atrial fibrillation on Coumadin at home: Currently off anticoagulation. On IV beta blockers. Rate well controlled. Will resume Warfarin in AM if tolerating PO well. Start digoxin in Am as well.  #8. History of cardiomyopathy (Compensated Chronic Systolic) status post defibrillator and pacemaker placement: EF known to be about 25% based on Dr. Vern Claude note from 12/13/10. Be cautious with IVF. Stable. Decreasing IVF.  #9. Possible UTI: Urine cultures showed no growth. Stop Cipro.  #10. Mild Anemia: Drop in hemoglobin is dilutional. Baseline around 9-10. No overt bleeding.  DVT prophylaxis with Heparin.  Full Code.   LOS: 4 days   [facility] Pager [phone] 02/17/2011, 10:29 AM

## 2011-02-17 NOTE — Progress Notes (Signed)
  Subjective: Having bm, passing flatus, tol clears no ab pain  Objective: Vital signs in last 24 hours: Temp:  [98 F (36.7 C)-98.7 F (37.1 C)] 98.1 F (36.7 C) (12/08 0407) Pulse Rate:  [64-66] 64  (12/08 0407) Resp:  [18-20] 20  (12/08 0407) BP: (125-141)/(70-77) 126/74 mmHg (12/08 0407) SpO2:  [97 %-100 %] 100 % (12/08 0407) Weight:  [150 lb 5.7 oz (68.2 kg)] 150 lb 5.7 oz (68.2 kg) (12/08 0407) Last BM Date: 02/16/11  Intake/Output from previous day: 12/07 0701 - 12/08 0700 In: 5211.7 [P.O.:720; I.V.:4491.7] Out: 2401 [Urine:2400; Stool:1] Intake/Output this shift: Total I/O In: 240 [P.O.:240] Out: 300 [Urine:300]  GI: soft, nontender  Lab Results:   [facility] 02/17/11 0645 02/16/11 0620  WBC 5.3 8.0  HGB 9.0* 9.3*  HCT 26.8* 27.5*  PLT 126* 138*   BMET  Basename 02/17/11 0645 02/16/11 0620  NA 138 144  K 3.5 4.2  CL 108 114*  CO2 23 22  GLUCOSE 83 86  BUN 26* 66*  CREATININE 1.20 2.14*  CALCIUM 7.9* 8.5   PT/INR  Basename 02/15/11 0610  LABPROT 17.6*  INR 1.42   ABG No results found for this basename: PHART:2,PCO2:2,PO2:2,HCO3:2 in the last 72 hours  Studies/Results: US Renal  02/16/2011  *RADIOLOGY REPORT*  Clinical Data: Cystic kidney disease.  Acute renal failure.  High blood pressure.  Diabetic.  RENAL/URINARY TRACT ULTRASOUND COMPLETE  Comparison:  02/14/2011 CT.  12/14/2010 ultrasound.  Findings:  Right Kidney:  10.2 cm.  4.3 mm lower pole nonobstructing stone. No hydronephrosis.  Left Kidney:  10.3 cm. Slightly complex left lower pole 3.9 x 3.2 x 3 cm cyst.  No hydronephrosis.  Bladder:  Decompressed without obvious mass.  IMPRESSION: 4.3 mm nonobstructing right lower pole renal calculus.  Left lower pole slightly complex 3.9 cm cystic structure.  Original Report Authenticated By: Fuller Canada, M.D.    Anti-infectives: Anti-infectives     Start     Dose/Rate Route Frequency Ordered Stop   02/16/11 0800   ciprofloxacin (CIPRO) IVPB 400  mg        400 mg 200 mL/hr over 60 Minutes Intravenous Every 24 hours 02/15/11 0906     02/14/11 0800   metroNIDAZOLE (FLAGYL) IVPB 500 mg  Status:  Discontinued        500 mg 100 mL/hr over 60 Minutes Intravenous Every 8 hours 02/14/11 0551 02/15/11 0755   02/14/11 0800   ciprofloxacin (CIPRO) IVPB 400 mg  Status:  Discontinued        400 mg 200 mL/hr over 60 Minutes Intravenous Every 12 hours 02/14/11 0724 02/15/11 0906          Assessment/Plan: SBO, questionable etiology, ? Ischemia  He is nontender, vitals nl, nl wbc Advance diet to fulls, will advance more tomorrow if cont to do well He needs consideration of outpt colo if he resolves    LOS: 4 days    [facility] 02/17/2011

## 2011-02-18 ENCOUNTER — Encounter (HOSPITAL_COMMUNITY): Payer: Self-pay | Admitting: Gastroenterology

## 2011-02-18 DIAGNOSIS — C61 Malignant neoplasm of prostate: Secondary | ICD-10-CM | POA: Insufficient documentation

## 2011-02-18 LAB — BASIC METABOLIC PANEL
CO2: 23 mEq/L (ref 19–32)
Chloride: 111 mEq/L (ref 96–112)
GFR calc non Af Amer: 69 mL/min — ABNORMAL LOW (ref >90)
Glucose, Bld: 90 mg/dL (ref 70–99)
Potassium: 4.1 mEq/L (ref 3.5–5.1)
Sodium: 141 mEq/L (ref 135–145)

## 2011-02-18 LAB — CBC
HCT: 25.1 % — ABNORMAL LOW (ref 39.0–52.0)
Hemoglobin: 8.1 g/dL — ABNORMAL LOW (ref 13.0–17.0)
MCH: 31.8 pg (ref 26.0–34.0)
MCV: 94 fL (ref 78.0–100.0)
RBC: 2.55 MIL/uL — ABNORMAL LOW (ref 4.22–5.81)
RDW: 15 % (ref 11.5–15.5)
WBC: 4.8 10*3/uL (ref 4.0–10.5)

## 2011-02-18 LAB — URINALYSIS, ROUTINE W REFLEX MICROSCOPIC
Glucose, UA: NEGATIVE mg/dL
Ketones, ur: NEGATIVE mg/dL
Leukocytes, UA: NEGATIVE
Nitrite: NEGATIVE
Specific Gravity, Urine: 1.011 (ref 1.005–1.030)
pH: 5 (ref 5.0–8.0)

## 2011-02-18 LAB — CREATININE, URINE, RANDOM: Creatinine, Urine: 55.9 mg/dL

## 2011-02-18 MED ORDER — DIGOXIN 125 MCG PO TABS
125.0000 ug | ORAL_TABLET | Freq: Every day | ORAL | Status: DC
  Administered 2011-02-18 – 2011-02-22 (×5): 125 ug via ORAL
  Filled 2011-02-18 (×5): qty 1

## 2011-02-18 MED ORDER — LISINOPRIL 10 MG PO TABS
10.0000 mg | ORAL_TABLET | Freq: Every day | ORAL | Status: DC
  Administered 2011-02-18 – 2011-02-22 (×5): 10 mg via ORAL
  Filled 2011-02-18 (×5): qty 1

## 2011-02-18 NOTE — Plan of Care (Signed)
Problem: Phase I Progression Outcomes Goal: OOB as tolerated unless otherwise ordered Outcome: Completed/Met Date Met:  02/18/11 Patient walked in the hallway.

## 2011-02-18 NOTE — Progress Notes (Signed)
  Subjective: No complaints. Still having dark bm's  Objective: Vital signs in last 24 hours: Temp:  [97.6 F (36.4 C)-98.4 F (36.9 C)] 98.4 F (36.9 C) (12/09 0342) Pulse Rate:  [63-70] 67  (12/09 0342) Resp:  [19-20] 20  (12/09 0342) BP: (136-140)/(72-78) 140/72 mmHg (12/09 0342) SpO2:  [99 %-100 %] 100 % (12/09 0342) Weight:  [154 lb 5.2 oz (70 kg)] 154 lb 5.2 oz (70 kg) (12/09 0342) Last BM Date: 02/17/11  Intake/Output from previous day: 12/08 0701 - 12/09 0700 In: 2372.5 [P.O.:960; I.V.:1012.5; IV Piggyback:400] Out: 2051 [Urine:2050; Stool:1] Intake/Output this shift: Total I/O In: 600 [P.O.:600] Out: 300 [Urine:300]  GI: soft, non-tender; bowel sounds normal; no masses,  no organomegaly  Lab Results:   Basename 02/18/11 0600 02/17/11 0645  WBC 4.7 5.3  HGB 8.1* 9.0*  HCT 24.1* 26.8*  PLT 115* 126*   BMET  Basename 02/18/11 0600 02/17/11 0645  NA 141 138  K 4.1 3.5  CL 111 108  CO2 23 23  GLUCOSE 90 83  BUN 11 26*  CREATININE 1.02 1.20  CALCIUM 8.2* 7.9*   PT/INR No results found for this basename: LABPROT:2,INR:2 in the last 72 hours ABG No results found for this basename: PHART:2,PCO2:2,PO2:2,HCO3:2 in the last 72 hours  Studies/Results: US Renal  02/16/2011  *RADIOLOGY REPORT*  Clinical Data: Cystic kidney disease.  Acute renal failure.  High blood pressure.  Diabetic.  RENAL/URINARY TRACT ULTRASOUND COMPLETE  Comparison:  02/14/2011 CT.  12/14/2010 ultrasound.  Findings:  Right Kidney:  10.2 cm.  4.3 mm lower pole nonobstructing stone. No hydronephrosis.  Left Kidney:  10.3 cm. Slightly complex left lower pole 3.9 x 3.2 x 3 cm cyst.  No hydronephrosis.  Bladder:  Decompressed without obvious mass.  IMPRESSION: 4.3 mm nonobstructing right lower pole renal calculus.  Left lower pole slightly complex 3.9 cm cystic structure.  Original Report Authenticated By: Fuller Canada, M.D.    Anti-infectives: Anti-infectives     Start     Dose/Rate Route  Frequency Ordered Stop   02/16/11 0800   ciprofloxacin (CIPRO) IVPB 400 mg  Status:  Discontinued        400 mg 200 mL/hr over 60 Minutes Intravenous Every 24 hours 02/15/11 0906 02/17/11 1035   02/14/11 0800   metroNIDAZOLE (FLAGYL) IVPB 500 mg  Status:  Discontinued        500 mg 100 mL/hr over 60 Minutes Intravenous Every 8 hours 02/14/11 0551 02/15/11 0755   02/14/11 0800   ciprofloxacin (CIPRO) IVPB 400 mg  Status:  Discontinued        400 mg 200 mL/hr over 60 Minutes Intravenous Every 12 hours 02/14/11 0724 02/15/11 0906          Assessment/Plan: s/p  hg down slightly today. possible GI consult. will follow  LOS: 5 days    TOTH III,Ronte Parker S 02/18/2011

## 2011-02-18 NOTE — Progress Notes (Addendum)
PCP: Pearson Grippe, MD, MD  Brief narrative: 77 year-old male with history of CAD status post CABG, atrial fibrillation on Coumadin, history of heart block and cardiomyopathy on pacemaker and defibrillator, who was recently in the hospital for syncope felt to be secondary to vasovagal episode presented the ER because of persistent abdominal pain since the day before admission. Along with it he had multiple episodes of nausea and vomiting but denied any diarrhea. The pain was located mostly in the epigastric and runs across his upper part of the abdomen. Patient states he was not able to eat anything.  He has since been found to have a small bowel obstruction on CT. Surgery is following. NG tube removed 12/7. Patient also had worsening renal failure for which nephrology was called. Renal function is improving as well.  Past medical history: Diabetes mellitus, Hypertension, CHF (congestive heart failure), CVA (cerebral vascular accident), MI (myocardial infarction), Pacemaker, S/P CABG (coronary artery bypass graft), Gout, Nephrolithiasis,  Lung cancer, Colon cancer, Coronary artery disease, Prostate cancer.  Consultants: [facility], Nephrology signed off, [facility] Randa Evens)  Procedures: NG tube placed 12/5. Removed 12/7  Subjective: Patient feels better. Complaining of loose stools which are black in color. No blood seen per patient. No abdominal pain. No N/V. Tolerating PO.  Objective: Vital signs in last 24 hours: Temp:  [97.6 F (36.4 C)-98.4 F (36.9 C)] 98.4 F (36.9 C) (12/09 0342) Pulse Rate:  [63-70] 67  (12/09 0342) Resp:  [19-20] 20  (12/09 0342) BP: (136-140)/(72-78) 140/72 mmHg (12/09 0342) SpO2:  [99 %-100 %] 100 % (12/09 0342) Weight:  [70 kg (154 lb 5.2 oz)] 154 lb 5.2 oz (70 kg) (12/09 0342) Weight change: 1.8 kg (3 lb 15.5 oz) Last BM Date: 02/17/11  Intake/Output from previous day: 12/08 0701 - 12/09 0700 In: 2372.5 [P.O.:960; I.V.:1012.5; IV Piggyback:400] Out: 2051  [Urine:2050; Stool:1] Intake/Output this shift: Total I/O In: 600 [P.O.:600] Out: 300 [Urine:300]  General appearance: alert, cooperative, appears stated age and no distress Resp: clear to auscultation bilaterally Cardio: regular rate and rhythm, S1, S2 normal, no murmur, click, rub or gallop GI: abnormal findings:  Soft. Non distended. Non tender. Normal bowel sounds. No masses or organomegaly. Extremities: extremities normal, atraumatic, no cyanosis or edema Pulses: 2+ and symmetric Neurologic: Alert and oriented X 3, normal strength and tone. Normal symmetric reflexes. Normal coordination and gait  Lab Results:  Basename 02/18/11 0600 02/17/11 0645  WBC 4.7 5.3  HGB 8.1* 9.0*  HCT 24.1* 26.8*  PLT 115* 126*   BMET  Basename 02/18/11 0600 02/17/11 0645  NA 141 138  K 4.1 3.5  CL 111 108  CO2 23 23  GLUCOSE 90 83  BUN 11 26*  CREATININE 1.02 1.20  CALCIUM 8.2* 7.9*    Studies/Results: US Renal  02/16/2011  *RADIOLOGY REPORT*  Clinical Data: Cystic kidney disease.  Acute renal failure.  High blood pressure.  Diabetic.  RENAL/URINARY TRACT ULTRASOUND COMPLETE  Comparison:  02/14/2011 CT.  12/14/2010 ultrasound.  Findings:  Right Kidney:  10.2 cm.  4.3 mm lower pole nonobstructing stone. No hydronephrosis.  Left Kidney:  10.3 cm. Slightly complex left lower pole 3.9 x 3.2 x 3 cm cyst.  No hydronephrosis.  Bladder:  Decompressed without obvious mass.  IMPRESSION: 4.3 mm nonobstructing right lower pole renal calculus.  Left lower pole slightly complex 3.9 cm cystic structure.  Original Report Authenticated By: Fuller Canada, M.D.    Medications:  Scheduled:    . metoprolol  5 mg Intravenous Q6H  . pantoprazole (PROTONIX) IV  40 mg Intravenous Q24H  . DISCONTD: ciprofloxacin  400 mg Intravenous Q24H  . DISCONTD: heparin subcutaneous  5,000 Units Subcutaneous Q8H    Principal Problem:  *SBO (small bowel obstruction) Active Problems:  Atrial fibrillation  AUTOMATIC  IMPLANTABLE CARDIAC DEFIBRILLATOR SITU  ARF (acute renal failure)  UTI (lower urinary tract infection)  Systolic CHF, chronic   Assessment/Plan: #1. Small Bowel Obstruction: Patient is doing better. Etiology remains unclear. Diet being advanced. NG taken out yesterday. Did have a colonoscopy in 2010 done by Dr. Virginia Rochester which showed internal hemorrhoids.  #2 Black stools: ?Melena. Check FOBT. Since his hemoglobin did drop and he is supposed to be on warfarin, he may need EGD. Discussed with Dr. Randa Evens with [facility]. He will evaluate. On PPI. Clear liquids for now. Check PT/PTT. Stop low dose heparin.  #3. Acute Renal insufficiency: Resolved. Decreased IVF rate.  #4. History of alcoholism: quit completely 6 months ago.   #5. History of prostate cancer status post recent seeding.   #6. CAD status post CABG: Stable.  #7. History of atrial fibrillation on Coumadin at home: Currently off anticoagulation. On IV beta blockers. Rate well controlled. Will resume Warfarin after EGD. Start digoxin.  #8. Compensated Chronic Systolic Heart Failure status post defibrillator and pacemaker placement: EF known to be about 25% based on Dr. Vern Claude note from 12/13/10. Be cautious with IVF. Stable. Decreasing IVF. Resume ACEI.  #9. Possible UTI: Urine cultures showed no growth. Stop Cipro.  #10. Mild Anemia: Drop in hemoglobin is now concerning along with report of black stool. See above. Recheck HGB later today.  DVT prophylaxis with SCD's.  Full Code.   LOS: 5 days   [facility] Pager [phone] 02/18/2011, 10:21 AM

## 2011-02-18 NOTE — Consult Note (Signed)
Reason for Consult:  GASTROENTEROLOGY CONSULT Referring Physician: [facility]  Primary Care: Dr Pearson Grippe  [name] is an 77 y.o. male.  HPI: 77 year old white male who has previously been followed by Dr. Sabino Gasser. According to the records, the patient has had a history of colon polyps and intermittently positive stools. Over the past 5 years the patient has had 3 EGDs and 3 colonoscopies. Last EGD and colonoscopy 2/10 revealed only hemorrhoids and a question of short segment Barrett's esophagus. The patient was on a 5 year followup [place] because of a previous history of colon polyps. He has had multiple other issues including a history of syncope and has resulted in placement of ICD/pacemaker. He has had a history of ischemic cardiomyopathy and is followed by [facility] cardiology. In addition to this, he was recently diagnosed with prostate cancer. (His medical history indicates colon cancer, but after discussion with the patient this is incorrect and that was really prostate cancer. ) He apparently underwent radiation seed implant recently in anticipation of radiation therapy to his prostate. The patient was apparently out walking and began to have chest and abdominal pain resulting in his admission. He is on chronic Coumadin therapy for atrial failed and has ICD for previous syncope. He was admitted to the hospital with a diagnosis of syncope, chest pain, abdominal pain. He was found to have what appeared to be small bowel obstruction a CT scan with marked dilation of the small bowel with a transition point in the left midabdomen. On the original CT, he had liquid material in the stomach up into the esophagus. He has been followed by surgery. The patient has never had abdominal surgery. This abdominal distention has cleared and he is passing air and having bowel movements. His bowel movements have been dark his hemoglobin has slowly decreased to approximately 8.1. It was felt by surgery that the  etiology of his small bowel obstruction with likely to be ischemic, based on the fact that he had never had previous abdominal surgery. The patient states that he has never had anything quite like this happen before. He adamantly denies the use of any nonsteroidal drugs prior to this admission. He is currently on Cipro and Flagyl as well as Protonix. He is tolerating clear liquids without pain. Were asked to see him regarding an etiology of his SBO as well as his GI bleeding.   Past Medical History  Diagnosis Date  . Diabetes mellitus   . Hypertension   . CHF (congestive heart failure)   . CVA (cerebral vascular accident)   . MI (myocardial infarction)   . Pacemaker   . S/P CABG (coronary artery bypass graft)   . Gout   . Nephrolithiasis   . Lung cancer   . Colon cancer   . Coronary artery disease     Past Surgical History  Procedure Date  . Colonoscopy   . Upper endoscpopy   . Icd insertion     status post BIV  . Coronary artery bypass graft     Family History  Problem Relation Age of Onset  . Alzheimer's disease Father 15  . Cancer Mother 23    metastatic cancer  . Diabetes Sister 67  . Diabetes Brother   . Diabetes Brother   . Diabetes Brother     Social History:  reports that he has never smoked. He does not have any smokeless tobacco history on file. He reports that he does not drink alcohol. His drug history  not on file.  Allergies:  Allergies  Allergen Reactions  . Aspirin     Medications: I have reviewed the patient's current medications.   Results for orders placed during the hospital encounter of 02/13/11 (from the past 48 hour(s))  BASIC METABOLIC PANEL     Status: Abnormal   Collection Time   02/17/11  6:45 AM      Component Value Range Comment   Sodium 138  135 - 145 (mEq/L)    Potassium 3.5  3.5 - 5.1 (mEq/L)    Chloride 108  96 - 112 (mEq/L)    CO2 23  19 - 32 (mEq/L)    Glucose, Bld 83  70 - 99 (mg/dL)    BUN 26 (*) 6 - 23 (mg/dL)     Creatinine, Ser 1.19  0.50 - 1.35 (mg/dL) DELTA CHECK NOTED   Calcium 7.9 (*) 8.4 - 10.5 (mg/dL)    GFR calc non Af Amer 57 (*) >90 (mL/min)    GFR calc Af Amer 66 (*) >90 (mL/min)   CBC     Status: Abnormal   Collection Time   02/17/11  6:45 AM      Component Value Range Comment   WBC 5.3  4.0 - 10.5 (K/uL)    RBC 2.81 (*) 4.22 - 5.81 (MIL/uL)    Hemoglobin 9.0 (*) 13.0 - 17.0 (g/dL)    HCT 14.7 (*) 82.9 - 52.0 (%)    MCV 95.4  78.0 - 100.0 (fL)    MCH 32.0  26.0 - 34.0 (pg)    MCHC 33.6  30.0 - 36.0 (g/dL)    RDW 56.2  13.0 - 86.5 (%)    Platelets 126 (*) 150 - 400 (K/uL)   SODIUM, URINE, RANDOM     Status: Normal   Collection Time   02/18/11  4:39 AM      Component Value Range Comment   Sodium, Ur 104     CREATININE, URINE, RANDOM     Status: Normal   Collection Time   02/18/11  4:39 AM      Component Value Range Comment   Creatinine, Urine 55.90     URINALYSIS, ROUTINE W REFLEX MICROSCOPIC     Status: Normal   Collection Time   02/18/11  4:39 AM      Component Value Range Comment   Color, Urine YELLOW  YELLOW     APPearance CLEAR  CLEAR     Specific Gravity, Urine 1.011  1.005 - 1.030     pH 5.0  5.0 - 8.0     Glucose, UA NEGATIVE  NEGATIVE (mg/dL)    Hgb urine dipstick NEGATIVE  NEGATIVE     Bilirubin Urine NEGATIVE  NEGATIVE     Ketones, ur NEGATIVE  NEGATIVE (mg/dL)    Protein, ur NEGATIVE  NEGATIVE (mg/dL)    Urobilinogen, UA 0.2  0.0 - 1.0 (mg/dL)    Nitrite NEGATIVE  NEGATIVE     Leukocytes, UA NEGATIVE  NEGATIVE  MICROSCOPIC NOT DONE ON URINES WITH NEGATIVE PROTEIN, BLOOD, LEUKOCYTES, NITRITE, OR GLUCOSE <1000 mg/dL.  BASIC METABOLIC PANEL     Status: Abnormal   Collection Time   02/18/11  6:00 AM      Component Value Range Comment   Sodium 141  135 - 145 (mEq/L)    Potassium 4.1  3.5 - 5.1 (mEq/L)    Chloride 111  96 - 112 (mEq/L)    CO2 23  19 - 32 (mEq/L)    Glucose,  Bld 90  70 - 99 (mg/dL)    BUN 11  6 - 23 (mg/dL) DELTA CHECK NOTED   Creatinine, Ser  1.02  0.50 - 1.35 (mg/dL)    Calcium 8.2 (*) 8.4 - 10.5 (mg/dL)    GFR calc non Af Amer 69 (*) >90 (mL/min)    GFR calc Af Amer 80 (*) >90 (mL/min)   CBC     Status: Abnormal   Collection Time   02/18/11  6:00 AM      Component Value Range Comment   WBC 4.7  4.0 - 10.5 (K/uL)    RBC 2.55 (*) 4.22 - 5.81 (MIL/uL)    Hemoglobin 8.1 (*) 13.0 - 17.0 (g/dL)    HCT 21.3 (*) 08.6 - 52.0 (%)    MCV 94.5  78.0 - 100.0 (fL)    MCH 31.8  26.0 - 34.0 (pg)    MCHC 33.6  30.0 - 36.0 (g/dL)    RDW 57.8  46.9 - 62.9 (%)    Platelets 115 (*) 150 - 400 (K/uL) PLATELET COUNT CONFIRMED BY SMEAR    US Renal  02/16/2011  *RADIOLOGY REPORT*  Clinical Data: Cystic kidney disease.  Acute renal failure.  High blood pressure.  Diabetic.  RENAL/URINARY TRACT ULTRASOUND COMPLETE  Comparison:  02/14/2011 CT.  12/14/2010 ultrasound.  Findings:  Right Kidney:  10.2 cm.  4.3 mm lower pole nonobstructing stone. No hydronephrosis.  Left Kidney:  10.3 cm. Slightly complex left lower pole 3.9 x 3.2 x 3 cm cyst.  No hydronephrosis.  Bladder:  Decompressed without obvious mass.  IMPRESSION: 4.3 mm nonobstructing right lower pole renal calculus.  Left lower pole slightly complex 3.9 cm cystic structure.  Original Report Authenticated By: Fuller Canada, M.D.    ROS: Patient notes history of previous EGD and colonoscopy as above. Prior to this admission, no chronic abdominal pain, dyspepsia, melena or hematochezia. His been able to tolerate regular diet without difficulty. Constitutional: Negative, respiratory: Negative, cardiovascular: Atrial fib anticoagulated. Denies the ICV discharging to the best of his knowledge. GU: Recently diagnosed prostate cancer with recent radiation seed implants due to began radiation therapy in the near future   Blood pressure 140/72, pulse 68, temperature 98.4 F (36.9 C), temperature source Oral, resp. rate 20, height 5\' 6"  (1.676 m), weight 70 kg (154 lb 5.2 oz), SpO2 100.00%.  Physical exam:   General: Alert white male in no distress. Quite talkative   eyes: Sclerae are nonicteric Neck: Supple no lymphadenopathy  Heart: Distant heart sounds, no gross murmur Lungs: Clear Abdomen: Completely nondistended today. Completely soft and nontender with normal bowel sounds.  Assessment/Plan:  1. SB O. Because of this is unclear and it appears that he has a fixed structural point small bowel. Clinically this has resolved he is passing air and stool.   2. Anemia and dark stools. He is having some GI bleeding and should probably have an upper endoscopy. 3. History of colon polyps with last colonoscopy in 2010 negative. 4. History of atrial fibrillation-anticoagulated, ischemic cardiomyopathy, cardiac arrhythmia requiring AICD. 5. Recently diagnosed prostate cancer status post implants in anticipation of external beam radiation therapy.  Plan: We'll begin with EGD to rule out upper GI source of bleeding. If negative, I think he should have small bowel enterograhy or capsule enterograhy to look for small bowel lesion. Adhesions are somewhat less likely since he has never had abdominal surgery and we will likely need to consider primary small bowel tumor or possible metastatic disease from  his recently diagnosed prostate cancer.  Debria Broecker JR,Jakorey Mcconathy L 02/18/2011, 11:00 AM

## 2011-02-19 ENCOUNTER — Encounter (HOSPITAL_COMMUNITY)
Admission: EM | Disposition: A | Discharge status: Home / Self Care | Payer: Self-pay | Source: Home / Self Care | Attending: Internal Medicine

## 2011-02-19 ENCOUNTER — Encounter (HOSPITAL_COMMUNITY): Payer: Self-pay | Admitting: *Deleted

## 2011-02-19 ENCOUNTER — Inpatient Hospital Stay (HOSPITAL_COMMUNITY): Payer: PRIVATE HEALTH INSURANCE

## 2011-02-19 DIAGNOSIS — K921 Melena: Secondary | ICD-10-CM | POA: Diagnosis not present

## 2011-02-19 HISTORY — PX: ESOPHAGOGASTRODUODENOSCOPY: SHX5428

## 2011-02-19 LAB — CBC
HCT: 24.1 % — ABNORMAL LOW (ref 39.0–52.0)
Hemoglobin: 8.1 g/dL — ABNORMAL LOW (ref 13.0–17.0)
MCH: 32.1 pg (ref 26.0–34.0)
MCHC: 34.3 g/dL (ref 30.0–36.0)
MCV: 93.8 fL (ref 78.0–100.0)
Platelets: 126 10*3/uL — ABNORMAL LOW (ref 150–400)
Platelets: 145 10*3/uL — ABNORMAL LOW (ref 150–400)
RBC: 2.57 MIL/uL — ABNORMAL LOW (ref 4.22–5.81)
RDW: 15 % (ref 11.5–15.5)
WBC: 5.6 10*3/uL (ref 4.0–10.5)

## 2011-02-19 LAB — UIFE/LIGHT CHAINS/TP QN, 24-HR UR
Alpha 1, Urine: DETECTED — AB
Free Kappa Lt Chains,Ur: 0.95 mg/dL (ref 0.14–2.42)
Free Kappa/Lambda Ratio: 2.64 ratio (ref 2.04–10.37)
Free Lambda Lt Chains,Ur: 0.36 mg/dL (ref 0.02–0.67)
Total Protein, Urine: 6.8 mg/dL

## 2011-02-19 LAB — BASIC METABOLIC PANEL
BUN: 7 mg/dL (ref 6–23)
Calcium: 8.1 mg/dL — ABNORMAL LOW (ref 8.4–10.5)
GFR calc non Af Amer: 78 mL/min — ABNORMAL LOW (ref >90)
Glucose, Bld: 93 mg/dL (ref 70–99)

## 2011-02-19 SURGERY — EGD (ESOPHAGOGASTRODUODENOSCOPY)
Anesthesia: Moderate Sedation

## 2011-02-19 MED ORDER — FENTANYL NICU IV SYRINGE 50 MCG/ML
INJECTION | INTRAMUSCULAR | Status: DC | PRN
  Administered 2011-02-19: 25 ug via INTRAVENOUS

## 2011-02-19 MED ORDER — IOHEXOL 300 MG/ML  SOLN
100.0000 mL | Freq: Once | INTRAMUSCULAR | Status: AC | PRN
  Administered 2011-02-19: 100 mL via INTRAVENOUS

## 2011-02-19 MED ORDER — FENTANYL CITRATE 0.05 MG/ML IJ SOLN
INTRAMUSCULAR | Status: AC
  Filled 2011-02-19: qty 2

## 2011-02-19 MED ORDER — SODIUM CHLORIDE 0.9 % IV SOLN: Freq: Once | INTRAVENOUS | Status: DC

## 2011-02-19 MED ORDER — MIDAZOLAM HCL 10 MG/2ML IJ SOLN
INTRAMUSCULAR | Status: AC
  Filled 2011-02-19: qty 2

## 2011-02-19 MED ORDER — BUTAMBEN-TETRACAINE-BENZOCAINE 2-2-14 % EX AERO
INHALATION_SPRAY | CUTANEOUS | Status: DC | PRN
  Administered 2011-02-19: 2 via TOPICAL

## 2011-02-19 MED ORDER — MIDAZOLAM HCL 10 MG/2ML IJ SOLN
INTRAMUSCULAR | Status: DC | PRN
  Administered 2011-02-19: 2.5 mg via INTRAVENOUS

## 2011-02-19 NOTE — Op Note (Signed)
[name] [address] [place], [place]  [address]  ENDOSCOPY PROCEDURE REPORT  PATIENT:  [name], [name]  MR#:  [id number] BIRTHDATE:  09-17-1932, 78 yrs. old  GENDER:  male  ENDOSCOPIST:  Vida Rigger, MD Referred by:  PROCEDURE DATE:  02/19/2011 PROCEDURE:  Upper endoscopic ultrasound with FNA ASA CLASS:  Class III INDICATIONS:  abnormal CT anemia guaiac positivity  MEDICATIONS:  25 mcg fentanyl 2.5 mg Versed TOPICAL ANESTHETIC: Used  DESCRIPTION OF PROCEDURE:   After the risks benefits and alternatives of the procedure were thoroughly explained, informed consent was obtained.  The Pentax Gastroscope [id number] endoscope was introduced through the mouth and advanced to the third portion of the duodenum.The instrument was slowly withdrawn as the mucosa was fully examined. <<PROCEDUREIMAGES>>  FINDINGS: 1. Small hiatal hernia 2. Minimal gastritis 3. Otherwise within normal limits EGD to the third portion of the duodenum  COMPLICATIONS:  None  ENDOSCOPIC IMPRESSION: Above  RECOMMENDATIONS: Per primary team slowly advance diet consider CT enterography next watch for signs of further bleeding although possibly from questionable recent prostate procedure REPEAT EXAM:  As needed  ______________________________ Vida Rigger, MD  CC:  n. eSIGNEDVida Rigger at 02/19/2011 10:52 AM  Rich Fuchs, [phone]

## 2011-02-19 NOTE — Progress Notes (Signed)
Need order to advance diet to heart healthy for supper.

## 2011-02-19 NOTE — Progress Notes (Signed)
Day of Surgery  Subjective: Still sleeping after EGD, small HH, and mild gastritis. Dr. Ewing Schlein is going to start liquids and advance diet.  Objective: Vital signs in last 24 hours: Temp:  [97.1 F (36.2 C)-98.9 F (37.2 C)] 98.9 F (37.2 C) (12/10 0908) Pulse Rate:  [60-63] 63  (12/10 0423) Resp:  [15-61] 22  (12/10 1101) BP: (120-164)/(62-82) 138/71 mmHg (12/10 1101) SpO2:  [94 %-100 %] 99 % (12/10 1101) Weight:  [70.081 kg (154 lb 8 oz)] 154 lb 8 oz (70.081 kg) (12/10 0423) Last BM Date: 02/18/11  Intake/Output from previous day: 12/09 0701 - 12/10 0700 In: 2698 [P.O.:1560; I.V.:1138] Out: 1900 [Urine:1900] Intake/Output this shift: Total I/O In: -  Out: 100 [Urine:100]  Sedated after EGD, +BS. Lab Results:   [facility] 02/19/11 0540 02/18/11 1535  WBC 4.0 4.8  HGB 8.1* 8.6*  HCT 23.6* 25.1*  PLT 126* 138*    BMET  Basename 02/19/11 0540 02/18/11 0600  NA 139 141  K 3.6 4.1  CL 109 111  CO2 24 23  GLUCOSE 93 90  BUN 7 11  CREATININE 0.97 1.02  CALCIUM 8.1* 8.2*   PT/INR  Basename 02/18/11 1024  LABPROT 15.6*  INR 1.21     Studies/Results: No results found.  Anti-infectives: Anti-infectives     Start     Dose/Rate Route Frequency Ordered Stop   02/16/11 0800   ciprofloxacin (CIPRO) IVPB 400 mg  Status:  Discontinued        400 mg 200 mL/hr over 60 Minutes Intravenous Every 24 hours 02/15/11 0906 02/17/11 1035   02/14/11 0800   metroNIDAZOLE (FLAGYL) IVPB 500 mg  Status:  Discontinued        500 mg 100 mL/hr over 60 Minutes Intravenous Every 8 hours 02/14/11 0551 02/15/11 0755   02/14/11 0800   ciprofloxacin (CIPRO) IVPB 400 mg  Status:  Discontinued        400 mg 200 mL/hr over 60 Minutes Intravenous Every 12 hours 02/14/11 0724 02/15/11 0906         Current Facility-Administered Medications  Medication Dose Route Frequency Provider Last Rate Last Dose  . 0.9 %  sodium chloride infusion   Intravenous Continuous Gokul Krishnan 40 mL/hr at  02/19/11 0112    . 0.9 %  sodium chloride infusion   Intravenous Once Petra Kuba, MD      . acetaminophen (TYLENOL) tablet 650 mg  650 mg Oral Q6H PRN Eduard Clos       Or  . acetaminophen (TYLENOL) suppository 650 mg  650 mg Rectal Q6H PRN Eduard Clos      . digoxin (LANOXIN) tablet 125 mcg  125 mcg Oral Daily Gokul Krishnan   125 mcg at 02/18/11 1055  . lisinopril (PRINIVIL,ZESTRIL) tablet 10 mg  10 mg Oral Daily Gokul Krishnan   10 mg at 02/18/11 1055  . metoprolol (LOPRESSOR) injection 5 mg  5 mg Intravenous Q6H Gokul Krishnan   5 mg at 02/19/11 1610  . morphine 2 MG/ML injection 1 mg  1 mg Intravenous Q4H PRN Eduard Clos   1 mg at 02/15/11 9604  . ondansetron (ZOFRAN) tablet 4 mg  4 mg Oral Q6H PRN Eduard Clos       Or  . ondansetron (ZOFRAN) injection 4 mg  4 mg Intravenous Q6H PRN Eduard Clos      . pantoprazole (PROTONIX) injection 40 mg  40 mg Intravenous Q24H Eduard Clos   40  mg at 02/18/11 2101  . DISCONTD: butamben-tetracaine-benzocaine (CETACAINE) spray    PRN Petra Kuba, MD   2 spray at 02/19/11 1029  . DISCONTD: fentaNYL NICU IV Syringe 50 mcg/mL    PRN Petra Kuba, MD   25 mcg at 02/19/11 1030  . DISCONTD: midazolam (VERSED) 10 MG/2ML injection    PRN Petra Kuba, MD   2.5 mg at 02/19/11 1031    Assessment/Plan RESOLVING SBO,? Etiology.  Guiac + stools with mild gastritis on EGD Patient Active Problem List  Diagnoses  . HEART BLOCK  . Atrial fibrillation  . AUTOMATIC IMPLANTABLE CARDIAC DEFIBRILLATOR SITU  . Syncope and collapse  . Chest pain on exertion  . SBO (small bowel obstruction)  . ARF (acute renal failure)  . UTI (lower urinary tract infection)  . Systolic CHF, chronic  . Prostate cancer, recur risk not determined whether low, med or high    Plan: Agree with restarting PO's. Follow with you.   LOS: 6 days    [name] 02/19/2011

## 2011-02-19 NOTE — Op Note (Signed)
PROCEDURE NOTE  02/13/2011 - 02/19/2011  11:03 AM  PATIENT:  [name]  77 y.o. male  PRE-OPERATIVE DIAGNOSIS:  Abnormal CT anemia guaiac positivity  POST-OPERATIVE DIAGNOSIS: Small hiatal hernia and mild gastritis only  PROCEDURE: EGD  SURGEON:  Surgeon(s): Petra Kuba, MD  ASSESSMENT/FINDINGS:  Above  PLAN OF CARE: Slowly advance diet watch for signs of further bleeding questionable bright red blood from questionable recent urologic procedure and consider CT enterography next if further small bowel workup is needed

## 2011-02-19 NOTE — Progress Notes (Signed)
Improving.  Advance diet.  Wilmon Arms. Corliss Skains, MD, [facility]  02/19/2011 2:09 PM

## 2011-02-19 NOTE — Progress Notes (Signed)
PCP: Pearson Grippe, MD, MD  Brief HPI: 77 year-old male with history of CAD status post CABG, atrial fibrillation on Coumadin, history of heart block and cardiomyopathy on pacemaker and defibrillator, who was recently in the hospital for syncope felt to be secondary to vasovagal episode presented the ER because of persistent abdominal pain since the day before admission. Along with it he had multiple episodes of nausea and vomiting but denied any diarrhea. The pain was located mostly in the epigastric and runs across his upper part of the abdomen. Patient states he was not able to eat anything.  He has since been found to have a small bowel obstruction on CT. Surgery is following. NG tube removed 12/7. Patient also had worsening renal failure for which nephrology was called. Renal function is better and at baseline.  Past medical history: Diabetes mellitus, Hypertension, CHF (congestive heart failure), CVA (cerebral vascular accident), MI (myocardial infarction), Pacemaker, S/P CABG (coronary artery bypass graft), Gout, Nephrolithiasis,  Lung cancer, Colon cancer, Coronary artery disease, Prostate cancer.  Consultants: [facility], Nephrology signed off, [facility]  Procedures: NG tube placed 12/5. Removed 12/7  Subjective: Patient feels same. Now complaining of occasional red blood in stool. Small quantity. No abdominal pain. No N/V. Tolerating PO.  Objective: Vital signs in last 24 hours: Temp:  [98.1 F (36.7 C)-98.9 F (37.2 C)] 98.9 F (37.2 C) (12/10 1453) Pulse Rate:  [62-63] 62  (12/10 1453) Resp:  [15-61] 20  (12/10 1453) BP: (120-158)/(62-82) 130/69 mmHg (12/10 1453) SpO2:  [94 %-100 %] 100 % (12/10 1453) Weight:  [70.081 kg (154 lb 8 oz)] 154 lb 8 oz (70.081 kg) (12/10 0423) Weight change: 0.081 kg (2.9 oz) Last BM Date: 02/18/11  Intake/Output from previous day: 12/09 0701 - 12/10 0700 In: 2698 [P.O.:1560; I.V.:1138] Out: 1900 [Urine:1900] Intake/Output this shift: Total  I/O In: -  Out: 375 [Urine:375]  General appearance: alert, cooperative, appears stated age and no distress Resp: clear to auscultation bilaterally Cardio: regular rate and rhythm, S1, S2 normal, no murmur, click, rub or gallop GI: abnormal findings:  Soft. Non distended. Non tender. Normal bowel sounds. No masses or organomegaly. Extremities: extremities normal, atraumatic, no cyanosis or edema Pulses: 2+ and symmetric Neurologic: Alert and oriented X 3, normal strength and tone. Normal symmetric reflexes. Normal coordination and gait  Lab Results:  Basename 02/19/11 0540 02/18/11 1535  WBC 4.0 4.8  HGB 8.1* 8.6*  HCT 23.6* 25.1*  PLT 126* 138*   BMET  Basename 02/19/11 0540 02/18/11 0600  NA 139 141  K 3.6 4.1  CL 109 111  CO2 24 23  GLUCOSE 93 90  BUN 7 11  CREATININE 0.97 1.02  CALCIUM 8.1* 8.2*    Studies/Results: No results found.  Medications:  Scheduled:    . sodium chloride   Intravenous Once  . digoxin  125 mcg Oral Daily  . lisinopril  10 mg Oral Daily  . metoprolol  5 mg Intravenous Q6H  . pantoprazole (PROTONIX) IV  40 mg Intravenous Q24H    Principal Problem:  *SBO (small bowel obstruction) Active Problems:  Atrial fibrillation  AUTOMATIC IMPLANTABLE CARDIAC DEFIBRILLATOR SITU  ARF (acute renal failure)  UTI (lower urinary tract infection)  Systolic CHF, chronic  Hematochezia  Melena   Assessment/Plan: #1. Small Bowel Obstruction: Patient is doing better. Etiology remains unclear. NG out. Did have a colonoscopy in 2010 done by Dr. Virginia Rochester which showed internal hemorrhoids.  #2 Hematochezia/Black stools: S/P EGD which revealed gastritis. GI (  Dr. Ewing Schlein) recommends CT Enterogram which we will order to rule out lesion in small bowel. If hematochezia persists may need colonoscopy/capsule study as well.   #3 Acute Blood Loss Anemia: Check CBC q6h. May need transfusion. Will like work up completed in hospital as patient is on warfarin at home.  #4.  Acute Renal insufficiency: Resolved. Decreased IVF rate.  #5. History of alcoholism: quit completely 6 months ago.   #6. History of prostate cancer status post recent seeding.   #7. CAD status post CABG: Stable.  #8. History of atrial fibrillation on Coumadin at home: Currently off anticoagulation. On IV beta blockers. Rate well controlled. Start digoxin.   #9. Compensated Chronic Systolic Heart Failure status post defibrillator and pacemaker placement: EF known to be about 25% based on Dr. Vern Claude note from 12/13/10. Be cautious with IVF. Stable. Resume ACEI.  #10. Possible UTI: Urine cultures showed no growth. Stopped Cipro.  DVT prophylaxis with SCD's.  Full Code.  Discussed with daughter [name] at [phone].   LOS: 6 days   [facility] Pager [phone] 02/19/2011, 3:49 PM

## 2011-02-20 ENCOUNTER — Encounter (HOSPITAL_COMMUNITY): Payer: Self-pay | Admitting: Gastroenterology

## 2011-02-20 LAB — CBC
HCT: 22.8 % — ABNORMAL LOW (ref 39.0–52.0)
HCT: 26.6 % — ABNORMAL LOW (ref 39.0–52.0)
HCT: 24.1 % — ABNORMAL LOW (ref 39.0–52.0)
Hemoglobin: 7.7 g/dL — ABNORMAL LOW (ref 13.0–17.0)
Hemoglobin: 9 g/dL — ABNORMAL LOW (ref 13.0–17.0)
MCH: 31.8 pg (ref 26.0–34.0)
MCH: 31.5 pg (ref 26.0–34.0)
MCHC: 33.8 g/dL (ref 30.0–36.0)
MCHC: 33.6 g/dL (ref 30.0–36.0)
MCV: 94 fL (ref 78.0–100.0)
MCV: 93.8 fL (ref 78.0–100.0)
RBC: 2.45 MIL/uL — ABNORMAL LOW (ref 4.22–5.81)
RBC: 2.83 MIL/uL — ABNORMAL LOW (ref 4.22–5.81)
RDW: 15.3 % (ref 11.5–15.5)

## 2011-02-20 LAB — BASIC METABOLIC PANEL
BUN: 5 mg/dL — ABNORMAL LOW (ref 6–23)
CO2: 24 mEq/L (ref 19–32)
Glucose, Bld: 100 mg/dL — ABNORMAL HIGH (ref 70–99)
Potassium: 3.6 mEq/L (ref 3.5–5.1)
Sodium: 137 mEq/L (ref 135–145)

## 2011-02-20 MED ORDER — METOPROLOL SUCCINATE ER 25 MG PO TB24
25.0000 mg | ORAL_TABLET | Freq: Every day | ORAL | Status: DC
  Administered 2011-02-20 – 2011-02-22 (×3): 25 mg via ORAL
  Filled 2011-02-20 (×3): qty 1

## 2011-02-20 MED ORDER — PANTOPRAZOLE SODIUM 40 MG PO TBEC
40.0000 mg | DELAYED_RELEASE_TABLET | Freq: Every day | ORAL | Status: DC
  Administered 2011-02-20 – 2011-02-22 (×3): 40 mg via ORAL
  Filled 2011-02-20 (×3): qty 1

## 2011-02-20 NOTE — Progress Notes (Signed)
[name] 2:05 PM  Subjective: The patient is doing better is not having any more pain wants to eat and has not seen any more blood and had no problems with his endoscopy yesterday  Objective: Vital signs stable afebrile abdominal exam is soft nontender hemoglobin slight drop CT okay  Assessment: Improved  Plan: Slowly advance diet watch for signs of further bleeding hold off on colonoscopy since he's had 3 or 4 in the not-too-distant past  Erdem Naas E

## 2011-02-20 NOTE — Progress Notes (Signed)
PROGRESS NOTE  [name]:[id number] DOB: 1933/02/23 DOA: 02/13/2011 PCP: Pearson Grippe, MD, MD  Brief narrative: 77 year old man recently discharged from the hospital her diagnoses including syncope secondary to orthostatic hypotension, acute renal failure secondary to diuretic therapy who presented to the hospital with abdominal pain and multiple episodes of nausea and vomiting. He was admitted for further evaluation for abdominal pain, hypercalcemia. CT scan on admission demonstrated a small bowel obstruction.  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 Minimal gastritis.  Interim History: Interim documentation reviewed. Subjective: Feels much better. Currently eating dinner. No complaints.  Objective:  Filed Vitals:   02/19/11 2100 02/20/11 0553 02/20/11 0954 02/20/11 1431  BP: 149/71 149/73 145/73 145/68  Pulse: 61 63  61  Temp: 98.8 F (37.1 C) 98.1 F (36.7 C)  98 F (36.7 C)  TempSrc: Oral Oral  Oral  Resp: 20 22  19   Height:      Weight:  70.6 kg (155 lb 10.3 oz)    SpO2: 100% 97%  100%    Intake/Output Summary (Last 24 hours) at 02/20/11 1710 Last data filed at 02/20/11 1300  Gross per 24 hour  Intake   2232 ml  Output      0  ml  Net   2232 ml    Exam: General: Appears calm and comfortable. Eating dinner. Cardiovascular: Regular rate and rhythm. No murmur, rub or gallop. No significant lower extremity edema. Respiratory: Clear to auscultation bilaterally. No wheezes, rales or rhonchi. Normal respiratory effort.  Data Reviewed: Basic Metabolic Panel:  Lab 02/20/11 8119 02/19/11 0540 02/18/11 0600 02/17/11 0645 02/16/11 0620 02/15/11 0610  NA 137 139 141 138 144 --  K 3.6 3.6 -- -- -- --  CL 105 109 111 108 114* --  CO2 24 24 23 23 22  --  GLUCOSE 100* 93 90 83 86 --  BUN 5* 7 11 26* 66* --  CREATININE 0.93 0.97 1.02 1.20 2.14* --  CALCIUM 8.3* 8.1* 8.2* 7.9* 8.5 --  MG -- -- -- -- -- 2.0  PHOS -- -- -- -- 3.8 --   Liver Function Tests:  Lab 02/16/11 0620 02/15/11 0610 02/13/11 2236  AST 21 17 20   ALT 14 13 19   ALKPHOS 62 86 107  BILITOT 0.6 0.7 1.0  PROT 6.6 8.5* 9.5*  ALBUMIN 3.2* 4.1 4.9    Lab 02/15/11 0610 02/14/11 0105  LIPASE 137* 94*  AMYLASE -- --   CBC:  Lab 02/20/11 1135 02/20/11 0816 02/20/11 0237 02/19/11 2036 02/19/11 0540 02/16/11 0620 02/13/11 2236  WBC 5.6 6.1 5.0 5.6 4.0 -- --  NEUTROABS -- -- -- -- -- 6.1 8.6*  HGB 8.1* 9.0* 7.7* 8.3* 8.1* -- --  HCT 24.1* 26.6* 22.8* 24.1* 23.6* -- --  MCV 93.8 94.0 93.1 93.8 93.7 -- --  PLT 133* 154 133* 145* 126* -- --   Cardiac Enzymes:  Lab 02/14/11 2122 02/14/11 1302 02/14/11 1478  CKTOTAL 53 43 44  CKMB 1.8 1.8 1.6  CKMBINDEX -- -- --  TROPONINI <0.30 <0.30 <0.30   CBG:  Lab 02/14/11 0638  GLUCAP 183*    Recent Results (from the past 240 hour(s))  MRSA PCR SCREENING     Status: Normal   Collection Time   02/14/11  6:36 AM      Component Value Range Status Comment   MRSA by PCR NEGATIVE  NEGATIVE  Final   URINE CULTURE     Status: Normal   Collection Time   02/15/11 11:15 AM      Component Value Range Status Comment   Specimen Description URINE, CLEAN CATCH   Final    Special Requests NONE   Final    Setup Time  [id number]   Final    Colony Count NO GROWTH   Final    Culture NO GROWTH   Final    Report Status 02/16/2011 FINAL   Final      Studies: Ct Abdomen Pelvis Wo Contrast  02/14/2011  *RADIOLOGY REPORT*  Clinical Data: Vomiting, right lower quadrant abdominal pain, and epigastric pain.  Elevated lactic acid.  CT ABDOMEN AND PELVIS WITHOUT CONTRAST  Technique:  Multidetector CT imaging of the abdomen and pelvis was performed following the standard protocol without intravenous contrast.  Comparison: CT of the abdomen and pelvis performed 11/28/2010, and renal ultrasound performed 12/14/2010  Findings: Mild scarring is noted at the lung bases, with chronic associated lung changes seen.  Unusual focal scarring is noted along the fissures, without definite evidence of a mass.  Diffuse coronary artery calcifications are seen.  The patient is status post median sternotomy; pacemaker/AICD leads are partially imaged. The distal esophagus is filled with fluid.  The liver and spleen are unremarkable in appearance.  The gallbladder is within normal limits.  The pancreas and adrenal glands are unremarkable.  A 3.8 cm cyst is again noted arising from the lower pole of the left kidney; this is slightly higher than simple fluid attenuation, but was noted to reflect a cyst on the prior ultrasound.  It is also relatively stable from the prior CT.  There is a 0.7 cm stone noted at the lower pole of the right kidney, also stable in appearance.  Mild nonspecific perinephric stranding is noted bilaterally.  There is no evidence of hydronephrosis.  No obstructing ureteral stones are identified.  There is significant distension of the stomach with fluid.  The proximal small bowel is diffusely distended, measuring up to 5.3 cm in diameter, to the level of the proximal ileum at the left mid abdomen.  There is gradual fecalization within the proximal ileum, with distended fecalized loops ending at a transition point at the left mid  abdomen.  If the patient has a history of surgery, this most likely reflects an adhesion.  The degree of fecalization suggests underlying small bowel dysmotility and incomplete obstruction, though the more distal small bowel loops are diffusely decompressed, and this may have gradually evolved from a partial to a high-grade small bowel obstruction.  No free fluid is identified.  No acute vascular abnormalities are seen.  Diffuse calcification is noted along the abdominal aorta and its branches, particularly prominent at the origins of the superior mesenteric artery and both renal arteries.  The appendix is normal in caliber, without evidence for appendicitis.  The colon is largely decompressed, also concerning for high-grade small bowel obstruction.  Diverticulosis is noted along the proximal sigmoid colon, without evidence of diverticulitis.  The bladder is mildly distended and grossly unremarkable in appearance.  Postoperative change is noted adjacent to the prostate.  The prostate remains normal in size.  No inguinal lymphadenopathy is seen.  No acute osseous abnormalities are identified.  A stable apparent compression deformity at L4 may reflect a very large Schmorl's node.  IMPRESSION:  1.  Apparent relatively high-grade small bowel obstruction noted at the proximal ileum, with diffuse distension of proximal small bowel loops to 5.3 cm in diameter, and a transition point at the left mid abdomen.  If the patient has a history of surgery, this most likely reflects an adhesion.  Fecalization of the proximal small bowel just before the transition point suggests underlying small bowel dysmotility and incomplete obstruction, though the more distal small bowel loops are diffusely decompressed; this may have gradually evolved from a partial to a high-grade small bowel obstruction. 2.  Significant distension of the stomach with fluid; the distal esophagus is also filled with fluid.  The patient's symptoms may be  partially relieved with a nasogastric tube, as deemed clinically appropriate. 3.  Left renal cyst and right renal stone, stable in appearance. 4.  No evidence for pancreatitis. 5.  Diverticulosis along the proximal sigmoid colon, without evidence of diverticulitis. 6.  Diffuse calcification along the abdominal aorta and its branches, particularly prominent at the origins of the superior mesenteric artery and both renal arteries. 7.  Unusual pattern of scarring at the lung bases, without definite evidence of a mass. 8.  Diffuse coronary artery calcifications seen. 9.  Stable apparent compression deformity at L4 may reflect a very large Schmorl's node.  Findings were discussed with Dr. Eber Hong at 05:14 a.m. on 02/14/2011.  Original Report Authenticated By: Tonia Ghent, M.D.   US Renal  02/16/2011  *RADIOLOGY REPORT*  Clinical Data: Cystic kidney disease.  Acute renal failure.  High blood pressure.  Diabetic.  RENAL/URINARY TRACT ULTRASOUND COMPLETE  Comparison:  02/14/2011 CT.  12/14/2010 ultrasound.  Findings:  Right Kidney:  10.2 cm.  4.3 mm lower pole nonobstructing stone. No hydronephrosis.  Left Kidney:  10.3 cm. Slightly complex left lower pole 3.9 x 3.2 x 3 cm cyst.  No hydronephrosis.  Bladder:  Decompressed without obvious mass.  IMPRESSION: 4.3 mm nonobstructing right lower pole renal calculus.  Left lower pole slightly complex 3.9 cm cystic structure.  Original Report Authenticated By: Fuller Canada, M.D.   Ct Entero Abd/pelvis W/cm  02/19/2011  *RADIOLOGY REPORT*  Clinical Data:  Small bowel obstruction.  Blood in stool.  Question small bowel source.  CT ABDOMEN AND PELVIS WITH CONTRAST (CT ENTEROGRAPHY)  Technique:  Multidetector CT of the abdomen and pelvis during bolus administration of intravenous contrast. Negative oral contrast VoLumen was given.  Contrast: OMNIPAQUE IOHEXOL 300 MG/ML IV SOLN  Comparison:  02/13/2009  Findings: The stomach is well distended.  No abnormal mucosal  or mural enhancement in the stomach.  The duodenum is normal.  There is no abnormal enhancement and small bowel.  No small bowel obstruction.  No evidence for small bowel stricture.  No small bowel mass lesion is evident.  Terminal ileum is normal. Diverticular changes seen in the left colon without diverticulitis. The colon is otherwise unremarkable.  The appendix is normal.  Liver and spleen have normal imaging features.  The pancreas gallbladder and adrenal glands are unremarkable.  7 ml nonobstructing stone is seen in the lower pole of the right kidney. 3.8 cm cystic lesion in the left kidney measures slightly  higher in attenuation would be expected for a simple cyst, but is unchanged in size comparing back to 02/27/2009.  No abdominal aortic aneurysm.  No evidence for lymphadenopathy in the abdomen or pelvis.  Bone windows reveal no worrisome lytic or sclerotic osseous lesions.  IMPRESSION: No small bowel abnormality to explain the patient's history of rectal bleeding.  The small bowel obstructive pattern seen on the previous CT scan from 5 days ago has resolved in the interval.  There is some mild distention of right-sided small bowel loops on today's study, but this is not outside of the spectrum for c t enterography.  Original Report Authenticated By: ERIC A. MANSELL, M.D.   Dg Abd 2 Views  02/15/2011  *RADIOLOGY REPORT*  Clinical Data: Lower abdominal pain  ABDOMEN - 2 VIEW  Comparison: CT scan 02/14/2011  Findings: Degenerative changes of the lumbar spine are noted.  Mild distended small bowel loops mid abdomen with multiple air-fluid levels suspicious for bowel obstruction. Again noted calcification lower pole region of the right kidney measures about 7 mm.  IMPRESSION: Distended small bowel loops mid abdomen with multiple air-fluid levels suspicious for bowel obstruction.  Again noted right renal calcification measures 7 mm.  Original Report Authenticated By: Natasha Mead, M.D.    Scheduled Meds:    . sodium chloride   Intravenous Once  . digoxin  125 mcg Oral Daily  . lisinopril  10 mg Oral Daily  . metoprolol  5 mg Intravenous Q6H  . pantoprazole (PROTONIX) IV  40 mg Intravenous Q24H   Continuous Infusions:   . sodium chloride 40 mL/hr at 02/20/11 1155     Assessment/Plan: 1. Small bowel obstruction: Resolved with supportive care. Gen. surgery continues to follow. 2. Questionable melena: GI prefers conservative approach at this point. Follow CBC. 3. Questionable Acute blood loss anemia: Appears to be stable at this point. Follow CBC. 4. Thrombocytopenia: Minimal. Will follow. 5. Hypercalcemia: Resolved with IV fluids. 6. Acute renal failure: Nephrosclerosis suspected. Acute injury likely secondary hypotension. Resolved with IV fluids. 7. History of atrial fibrillation on warfarin: Stable. Resume Coumadin soon. Change metoprolol to oral. and digoxin. 8. Chronic systolic heart failure: Ejection fraction 25%.  Code Status: Full Family Communication: Disposition Plan: Home when improved.   Brendia Sacks, MD  [facility] Pager [phone] 02/20/2011, 5:10 PM    LOS: 7 days

## 2011-02-20 NOTE — Progress Notes (Signed)
Patient ID: [name], male   DOB: September 04, 1932, 77 y.o.   MRN: [id number] 1 Day Post-Op  Subjective: Pt feels well without complaints.  Would like more to eat.  No further dark stools.  Objective: Vital signs in last 24 hours: Temp:  [98.1 F (36.7 C)-98.9 F (37.2 C)] 98.1 F (36.7 C) (12/11 0553) Pulse Rate:  [61-63] 63  (12/11 0553) Resp:  [15-61] 22  (12/11 0553) BP: (120-158)/(62-82) 149/73 mmHg (12/11 0553) SpO2:  [94 %-100 %] 97 % (12/11 0553) FiO2 (%):  [100 %] 100 % (12/10 2100) Weight:  [155 lb 10.3 oz (70.6 kg)] 155 lb 10.3 oz (70.6 kg) (12/11 0553) Last BM Date: 02/19/11  Intake/Output from previous day: 12/10 0701 - 12/11 0700 In: 1512 [P.O.:840; I.V.:672] Out: 375 [Urine:375] Intake/Output this shift:    PE: Abd: soft, NT, ND, +BS  Lab Results:   [facility] 02/20/11 0237 02/19/11 2036  WBC 5.0 5.6  HGB 7.7* 8.3*  HCT 22.8* 24.1*  PLT 133* 145*   BMET  Basename 02/19/11 0540 02/18/11 0600  NA 139 141  K 3.6 4.1  CL 109 111  CO2 24 23  GLUCOSE 93 90  BUN 7 11  CREATININE 0.97 1.02  CALCIUM 8.1* 8.2*   PT/INR  Basename 02/18/11 1024  LABPROT 15.6*  INR 1.21     Studies/Results: Ct Entero Abd/pelvis W/cm  02/19/2011  *RADIOLOGY REPORT*  Clinical Data:  Small bowel obstruction.  Blood in stool.  Question small bowel source.  CT ABDOMEN AND PELVIS WITH CONTRAST (CT ENTEROGRAPHY)  Technique:  Multidetector CT of the abdomen and pelvis during bolus administration of intravenous contrast. Negative oral contrast VoLumen was given.  Contrast: OMNIPAQUE IOHEXOL 300 MG/ML IV SOLN  Comparison:  02/13/2009  Findings: The stomach is well distended.  No abnormal mucosal or mural enhancement in the stomach.  The duodenum is normal.  There is no abnormal enhancement and small bowel.  No small bowel obstruction.  No evidence for small bowel stricture.  No small bowel mass lesion is evident.  Terminal ileum is normal. Diverticular changes seen in the left  colon without diverticulitis. The colon is otherwise unremarkable.  The appendix is normal.  Liver and spleen have normal imaging features.  The pancreas gallbladder and adrenal glands are unremarkable.  7 ml nonobstructing stone is seen in the lower pole of the right kidney. 3.8 cm cystic lesion in the left kidney measures slightly higher in attenuation would be expected for a simple cyst, but is unchanged in size comparing back to 02/27/2009.  No abdominal aortic aneurysm.  No evidence for lymphadenopathy in the abdomen or pelvis.  Bone windows reveal no worrisome lytic or sclerotic osseous lesions.  IMPRESSION: No small bowel abnormality to explain the patient's history of rectal bleeding.  The small bowel obstructive pattern seen on the previous CT scan from 5 days ago has resolved in the interval.  There is some mild distention of right-sided small bowel loops on today's study, but this is not outside of the spectrum for c t enterography.  Original Report Authenticated By: ERIC A. MANSELL, M.D.    Anti-infectives: Anti-infectives     Start     Dose/Rate Route Frequency Ordered Stop   02/16/11 0800   ciprofloxacin (CIPRO) IVPB 400 mg  Status:  Discontinued        400 mg 200 mL/hr over 60 Minutes Intravenous Every 24 hours 02/15/11 0906 02/17/11 1035   02/14/11 0800   metroNIDAZOLE (FLAGYL) IVPB  500 mg  Status:  Discontinued        500 mg 100 mL/hr over 60 Minutes Intravenous Every 8 hours 02/14/11 0551 02/15/11 0755   02/14/11 0800   ciprofloxacin (CIPRO) IVPB 400 mg  Status:  Discontinued        400 mg 200 mL/hr over 60 Minutes Intravenous Every 12 hours 02/14/11 0724 02/15/11 0906           Assessment/Plan  1. SBO, resolved 2. GI bleed  Plan: 1. CT entero was negative for SB source for SBO or cause for GI bleed. 2. Await GI's workup to see if anything further from our standpoint needs to be done. 3. Will follow   LOS: 7 days    Joseline Mccampbell E 02/20/2011

## 2011-02-20 NOTE — Progress Notes (Signed)
Will standby for GI's work-up.  Wilmon Arms. Corliss Skains, MD, [facility] Surgery  02/20/2011 10:08 AM

## 2011-02-21 LAB — CBC
Hemoglobin: 8.1 g/dL — ABNORMAL LOW (ref 13.0–17.0)
MCH: 31.9 pg (ref 26.0–34.0)
MCHC: 33.5 g/dL (ref 30.0–36.0)
RDW: 15.4 % (ref 11.5–15.5)

## 2011-02-21 NOTE — Progress Notes (Signed)
[name] 10:56 AM  Subjective: The patient had some left upper quadrant pain last night which resolved with Tylenol otherwise he is eating well and moving his bowels without signs of bleeding and no nausea or vomiting and no other complaints  Objective: Vital signs stable afebrile no acute distress abdomen is soft nontender cannot redo predict pain white count and hemoglobin stable  Assessment: Improve small bowel obstruction  Plan: Probably my partner Dr. Randa Evens or Dr. Madilyn Fireman should see him back as an outpatient please let us know if we can be of any further assistance during this hospital stay  [facility] E

## 2011-02-21 NOTE — Progress Notes (Addendum)
We will see on a PRN basis.  Call us if any surgical issues arise. Further work-up per GI  Wilmon Arms. Corliss Skains, MD, [facility] Surgery  02/21/2011 10:33 AM

## 2011-02-21 NOTE — Progress Notes (Signed)
Patient ID: [name], male   DOB: [birth year], 77 y.o.   MRN: [id number] Patient ID: [name], male   DOB: 11/10/1932, 77 y.o.   MRN: [id number] 2 Days Post-Op  Subjective: Pt feels well without complaints.  Up to a regular diet, no further blood in stool.  Bowels working normally.  Objective: Vital signs in last 24 hours: Temp:  [98 F (36.7 C)-98.9 F (37.2 C)] 98.5 F (36.9 C) (12/12 0514) Pulse Rate:  [60-61] 60  (12/12 0514) Resp:  [19-20] 20  (12/12 0514) BP: (135-147)/(64-78) 147/78 mmHg (12/12 0514) SpO2:  [99 %-100 %] 99 % (12/12 0514) Weight:  [71.1 kg (156 lb 12 oz)] 156 lb 12 oz (71.1 kg) (12/12 0514) Last BM Date: 02/19/11  Intake/Output from previous day: 12/11 0701 - 12/12 0700 In: 1080 [P.O.:1080] Out: 1200 [Urine:1200] Intake/Output this shift: Total I/O In: 540 [P.O.:540] Out: 225 [Urine:225]  PE: Abd: soft, NT, ND, +BS  Lab Results:   [facility] 02/21/11 0538 02/20/11 1135  WBC 6.1 5.6  HGB 8.1* 8.1*  HCT 24.2* 24.1*  PLT 141* 133*   BMET  Basename 02/20/11 0816 02/19/11 0540  NA 137 139  K 3.6 3.6  CL 105 109  CO2 24 24  GLUCOSE 100* 93  BUN 5* 7  CREATININE 0.93 0.97  CALCIUM 8.3* 8.1*   PT/INR  Basename 02/18/11 1024  LABPROT 15.6*  INR 1.21     Studies/Results: Ct Entero Abd/pelvis W/cm  02/19/2011  *RADIOLOGY REPORT*  Clinical Data:  Small bowel obstruction.  Blood in stool.  Question small bowel source.  CT ABDOMEN AND PELVIS WITH CONTRAST (CT ENTEROGRAPHY)  Technique:  Multidetector CT of the abdomen and pelvis during bolus administration of intravenous contrast. Negative oral contrast VoLumen was given.  Contrast: OMNIPAQUE IOHEXOL 300 MG/ML IV SOLN  Comparison:  02/13/2009  Findings: The stomach is well distended.  No abnormal mucosal or mural enhancement in the stomach.  The duodenum is normal.  There is no abnormal enhancement and small bowel.  No small bowel obstruction.  No evidence for small bowel stricture.  No  small bowel mass lesion is evident.  Terminal ileum is normal. Diverticular changes seen in the left colon without diverticulitis. The colon is otherwise unremarkable.  The appendix is normal.  Liver and spleen have normal imaging features.  The pancreas gallbladder and adrenal glands are unremarkable.  7 ml nonobstructing stone is seen in the lower pole of the right kidney. 3.8 cm cystic lesion in the left kidney measures slightly higher in attenuation would be expected for a simple cyst, but is unchanged in size comparing back to 02/27/2009.  No abdominal aortic aneurysm.  No evidence for lymphadenopathy in the abdomen or pelvis.  Bone windows reveal no worrisome lytic or sclerotic osseous lesions.  IMPRESSION: No small bowel abnormality to explain the patient's history of rectal bleeding.  The small bowel obstructive pattern seen on the previous CT scan from 5 days ago has resolved in the interval.  There is some mild distention of right-sided small bowel loops on today's study, but this is not outside of the spectrum for c t enterography.  Original Report Authenticated By: ERIC A. MANSELL, M.D.    Anti-infectives: Anti-infectives     Start     Dose/Rate Route Frequency Ordered Stop   02/16/11 0800   ciprofloxacin (CIPRO) IVPB 400 mg  Status:  Discontinued        400 mg 200 mL/hr over 60 Minutes Intravenous  Every 24 hours 02/15/11 0906 02/17/11 1035   02/14/11 0800   metroNIDAZOLE (FLAGYL) IVPB 500 mg  Status:  Discontinued        500 mg 100 mL/hr over 60 Minutes Intravenous Every 8 hours 02/14/11 0551 02/15/11 0755   02/14/11 0800   ciprofloxacin (CIPRO) IVPB 400 mg  Status:  Discontinued        400 mg 200 mL/hr over 60 Minutes Intravenous Every 12 hours 02/14/11 0724 02/15/11 0906           Assessment/Plan  1. SBO, resolved 2. GI bleed  Plan: 1.No real change since yesterday.  He continues to advace diet without any issues. We will continues to be follow as needed.  SBO resolved.                              LOS: 8 days    [name] 02/21/2011

## 2011-02-21 NOTE — Progress Notes (Signed)
PROGRESS NOTE  [name] ZOX:[id number] DOB: 06-12-1932 DOA: 02/13/2011 PCP: Pearson Grippe, MD, MD  Brief narrative: 77 year old man recently discharged from the hospital her diagnoses including syncope secondary to orthostatic hypotension, acute renal failure secondary to diuretic therapy who presented to the hospital with abdominal pain and multiple episodes of nausea and vomiting. He was admitted for further evaluation for abdominal pain, hypercalcemia. CT scan on admission demonstrated a small bowel obstruction.  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 Minimal gastritis.  Interim History: Interim documentation reviewed. General surgery has signed off. Gastroenterology has signed off. Subjective: Has no complaints today. Feels quite well.  Objective:  Filed Vitals:   02/20/11 2202 02/21/11 0514 02/21/11 1109 02/21/11 1351  BP: 135/64 147/78 127/74 135/78  Pulse: 60 60 63 62  Temp: 98.9 F (37.2 C) 98.5 F (36.9 C)  97.4 F (36.3 C)  TempSrc: Oral Oral  Oral  Resp: 20 20  18   Height:      Weight:  71.1 kg (156 lb 12 oz)    SpO2: 99% 99%  99%    Intake/Output Summary (Last 24 hours) at 02/21/11 1757 Last data filed at 02/21/11 1308  Gross per 24 hour  Intake    780 ml  Output   1425 ml  Net   -645 ml    Exam: General: Appears calm and comfortable.  Cardiovascular: Regular rate and rhythm. No murmur, rub or gallop. No significant lower extremity edema. Respiratory: Clear to auscultation bilaterally. No wheezes, rales or rhonchi. Normal respiratory effort.  Data Reviewed: Basic Metabolic Panel:  Lab 02/20/11 8119 02/19/11 0540 02/18/11 0600 02/17/11 0645 02/16/11 0620 02/15/11 0610  NA 137 139 141 138 144 --  K 3.6 3.6 -- -- -- --  CL 105 109 111 108 114* --  CO2 24 24 23 23 22  --  GLUCOSE 100* 93 90 83 86 --  BUN 5* 7 11 26* 66* --  CREATININE 0.93 0.97 1.02 1.20 2.14* --  CALCIUM 8.3* 8.1* 8.2* 7.9* 8.5 --  MG -- -- -- -- -- 2.0  PHOS -- -- -- -- 3.8 --   CBC:  Lab 02/21/11 0538 02/20/11 1135 02/20/11 0816 02/20/11 0237 02/19/11 2036 02/16/11 0620  WBC 6.1 5.6 6.1 5.0 5.6 --  NEUTROABS -- -- -- -- -- 6.1  HGB 8.1* 8.1* 9.0* 7.7* 8.3* --  HCT 24.2* 24.1* 26.6* 22.8* 24.1* --  MCV 95.3 93.8 94.0 93.1 93.8 --  PLT 141* 133* 154 133* 145* --   Scheduled Meds:    . digoxin  125 mcg Oral Daily  . lisinopril  10 mg Oral Daily  . metoprolol  25 mg Oral Daily  . pantoprazole  40 mg Oral Q1200  . DISCONTD: sodium chloride   Intravenous Once  . DISCONTD: metoprolol  5 mg Intravenous Q6H  . DISCONTD: pantoprazole (PROTONIX) IV  40 mg Intravenous Q24H   Continuous Infusions:    .  DISCONTD: sodium chloride 40 mL/hr at 02/20/11 1155     Assessment/Plan: 1. Small bowel obstruction: Resolved with supportive care.  2. Questionable melena: GI prefers conservative approach at this point. CBC stable. 3. Questionable Acute blood loss anemia: Appears to be stable at this point.  4. Thrombocytopenia: Minimal. Stable. Followup as an outpatient. 5. Hypercalcemia: Resolved with IV fluids. 6. Acute renal failure: Nephrosclerosis suspected. Acute injury likely secondary hypotension. Resolved with IV  fluids. 7. History of atrial fibrillation on warfarin: Stable. Resume Coumadin as an outpatient. Continue metoprolol and digoxin. 8. Chronic systolic heart failure: Ejection fraction 25%.  Code Status: Full  Disposition Plan: Home December 13.   Brendia Sacks, MD  [facility] Pager [phone] 02/21/2011, 5:57 PM    LOS: 8 days

## 2011-02-22 MED ORDER — SODIUM CHLORIDE 0.9 % IJ SOLN: 3.0000 mL | Freq: Two times a day (BID) | INTRAMUSCULAR | Status: DC

## 2011-02-22 NOTE — Progress Notes (Signed)
[name] 10:06 AM  Subjective: The patient has no abdominal pain no signs of bleeding and is eating fine and has no other complaints  Objective: Vital signs stable afebrile abdomen is soft nontender no new labs  Assessment: Improved  Plan: per hospital team let us know if we can help happy to see back when necessary  [facility]

## 2011-02-22 NOTE — Discharge Summary (Signed)
Physician Discharge Summary  [name] ZOX:[id number] DOB: 1933-02-19 DOA: 02/13/2011  PCP: Pearson Grippe, MD, MD  Admit date: 02/13/2011 Discharge date: 02/22/2011  Discharge Diagnoses:  1. Small bowel obstruction, resolved 2. Melena, resolved 3. Acute blood loss anemia, stable 4. Minimal thrombocytopenia, stable 5. Hypercalcemia, resolved 6. Acute renal failure, resolved 7. History of atrial fibrillation. Warfarin currently on secondary to above.  Discharge Condition: Improved  Disposition: Home  History of present illness:  77 year old man recently discharged from the hospital with diagnoses including syncope secondary to orthostatic hypotension, acute renal failure secondary to diuretic therapy who presented to the hospital with abdominal pain and multiple episodes of nausea and vomiting. He was admitted for further evaluation for abdominal pain, hypercalcemia. CT scan on admission demonstrated a small bowel obstruction.  Hospital Course:  Patient was admitted and seen in consultation with Gen. surgery and gastroenterology. Fortunately his small bowel obstruction resolved with supportive care only. Gen. surgery signed off. Etiology of the small bowel obstruction was unclear. The patient had episode of melena and acute blood loss however he did not require blood products. Gastroenterology saw the patient in consultation the favored a conservative approach. Patient's Coumadin is currently on hold because of his primary care physician but probably could be restarted next week. The etiology significance of those to setting is unclear but appears to be mild in nature. Consider repeat CBC next week. 1. Small bowel obstruction: Resolved with supportive care.    2. Melena: GI prefers conservative approach at this point. CBC stable.  3. Acute blood loss anemia: Appears to be stable at this point.    4. Thrombocytopenia: Minimal. Stable. Followup as an outpatient.  5. Hypercalcemia: Resolved  with IV fluids.  6. Acute renal failure: Acute injury likely secondary hypotension. Resolved with IV fluids.  7. History of atrial fibrillation on warfarin: Stable. Resume Coumadin as an outpatient. Continue metoprolol and digoxin.  8. Chronic systolic heart failure: Ejection fraction 25%.  Consultants:  General surgery   Nephrology  Gastroenterology  Procedures:  December 10: EGD: Small hiatal hernia. Minimal gastritis.   Discharge Instructions  Discharge Orders    Future Appointments: Provider: Department: Dept Phone: Center:   03/20/2011 7:30 AM [facility] [phone] None   03/20/2011 8:00 AM Maryln Gottron, MD [facility] [phone] None   03/22/2011 10:05 AM Amber Caryl Bis, RN [facility] [phone] [id number]     Future Orders Please Complete By Expires   Diet - low sodium heart healthy      Increase activity slowly        Current Discharge Medication List    CONTINUE these medications which have NOT CHANGED   Details  allopurinol (ZYLOPRIM) 300 MG tablet Take 300 mg by mouth daily.      atorvastatin (LIPITOR) 20 MG tablet Take 20 mg by mouth daily.      CALCIUM-VITAMIN D PO Take 1 tablet by mouth daily.      Cyanocobalamin (VITAMIN B-12 PO) Take 1 tablet by mouth daily.      digoxin (LANOXIN) 0.125 MG tablet Take 125 mcg by mouth daily.      fish oil-omega-3 fatty acids 1000 MG capsule Take 2 g by mouth daily.      furosemide (LASIX) 20 MG tablet Take 20 mg by mouth daily.      isosorbide mononitrate (IMDUR) 30 MG 24 hr tablet Take 30 mg by mouth daily.      lisinopril (PRINIVIL,ZESTRIL) 10 MG tablet Take 10  mg by mouth daily.      metoprolol (TOPROL-XL) 50 MG 24 hr tablet Take 50 mg by mouth daily.      omeprazole (PRILOSEC) 20 MG capsule Take 20 mg by mouth daily.      potassium chloride SA (K-DUR,KLOR-CON) 20 MEQ tablet Take 20 mEq by mouth 2 (two) times daily.        STOP taking these medications      warfarin (COUMADIN) 2.5 MG tablet        Follow-up Information    Follow up with Pearson Grippe, MD in 1 week.          The results of significant diagnostics from this hospitalization (including imaging, microbiology, ancillary and laboratory) are listed below for reference.    Significant Diagnostic Studies: Ct Abdomen Pelvis Wo Contrast  02/14/2011  *RADIOLOGY REPORT*  Clinical Data: Vomiting, right lower quadrant abdominal pain, and epigastric pain.  Elevated lactic acid.  CT ABDOMEN AND PELVIS WITHOUT CONTRAST  Technique:  Multidetector CT imaging of the abdomen and pelvis was performed following the standard protocol without intravenous contrast.  Comparison: CT of the abdomen and pelvis performed 11/28/2010, and renal ultrasound performed 12/14/2010  Findings: Mild scarring is noted at the lung bases, with chronic associated lung changes seen.  Unusual focal scarring is noted along the fissures, without definite evidence of a mass.  Diffuse coronary artery calcifications are seen.  The patient is status post median sternotomy; pacemaker/AICD leads are partially imaged. The distal esophagus is filled with fluid.  The liver and spleen are unremarkable in appearance.  The gallbladder is within normal limits.  The pancreas and adrenal glands are unremarkable.  A 3.8 cm cyst is again noted arising from the lower pole of the left kidney; this is slightly higher than simple fluid attenuation, but was noted to reflect a cyst on the prior ultrasound.  It is also relatively stable from the prior CT.  There is a 0.7 cm stone noted at the lower pole of the right kidney, also stable in appearance.  Mild nonspecific perinephric stranding is noted bilaterally.  There is no evidence of hydronephrosis.  No obstructing ureteral stones are identified.  There is significant distension of the stomach with fluid.  The proximal small bowel is diffusely distended, measuring up to 5.3 cm in diameter, to the level of the  proximal ileum at the left mid abdomen.  There is gradual fecalization within the proximal ileum, with distended fecalized loops ending at a transition point at the left mid abdomen.  If the patient has a history of surgery, this most likely reflects an adhesion.  The degree of fecalization suggests underlying small bowel dysmotility and incomplete obstruction, though the more distal small bowel loops are diffusely decompressed, and this may have gradually evolved from a partial to a high-grade small bowel obstruction.  No free fluid is identified.  No acute vascular abnormalities are seen.  Diffuse calcification is noted along the abdominal aorta and its branches, particularly prominent at the origins of the superior mesenteric artery and both renal arteries.  The appendix is normal in caliber, without evidence for appendicitis.  The colon is largely decompressed, also concerning for high-grade small bowel obstruction.  Diverticulosis is noted along the proximal sigmoid colon, without evidence of diverticulitis.  The bladder is mildly distended and grossly unremarkable in appearance.  Postoperative change is noted adjacent to the prostate.  The prostate remains normal in size.  No inguinal lymphadenopathy is seen.  No acute osseous  abnormalities are identified.  A stable apparent compression deformity at L4 may reflect a very large Schmorl's node.  IMPRESSION:  1.  Apparent relatively high-grade small bowel obstruction noted at the proximal ileum, with diffuse distension of proximal small bowel loops to 5.3 cm in diameter, and a transition point at the left mid abdomen.  If the patient has a history of surgery, this most likely reflects an adhesion.  Fecalization of the proximal small bowel just before the transition point suggests underlying small bowel dysmotility and incomplete obstruction, though the more distal small bowel loops are diffusely decompressed; this may have gradually evolved from a partial to a  high-grade small bowel obstruction. 2.  Significant distension of the stomach with fluid; the distal esophagus is also filled with fluid.  The patient's symptoms may be partially relieved with a nasogastric tube, as deemed clinically appropriate. 3.  Left renal cyst and right renal stone, stable in appearance. 4.  No evidence for pancreatitis. 5.  Diverticulosis along the proximal sigmoid colon, without evidence of diverticulitis. 6.  Diffuse calcification along the abdominal aorta and its branches, particularly prominent at the origins of the superior mesenteric artery and both renal arteries. 7.  Unusual pattern of scarring at the lung bases, without definite evidence of a mass. 8.  Diffuse coronary artery calcifications seen. 9.  Stable apparent compression deformity at L4 may reflect a very large Schmorl's node.  Findings were discussed with Dr. Eber Hong at 05:14 a.m. on 02/14/2011.  Original Report Authenticated By: Tonia Ghent, M.D.   Ct Head W Wo Contrast  02/09/2011  *RADIOLOGY REPORT*  Clinical Data: Altered mental status and history prostate cancer. Left side of face when none today.  CT HEAD WITHOUT AND WITH CONTRAST  Technique:  Contiguous axial images were obtained from the base of the skull through the vertex without and with intravenous contrast.  Contrast: 75mL OMNIPAQUE IOHEXOL 300 MG/ML IV SOLN  Comparison: Head CT 12/13/2010 and nuclear medicine bone scan 11/16/2010  Findings: There is diffuse cortical atrophy, stable.  Ventricles are stable and normal in size.  No evidence of acute intracranial abnormality.  Specifically, there is no hemorrhage, mass lesion, or evidence of acute infarction.  There is no midline shift or mass effect.  On postcontrast images, there is no abnormal area of enhancement.  The visualized paranasal sinuses, mastoid air cells, and middle ears are clear.  The skull is intact.  IMPRESSION:   Stable head CT.  Atrophy.  No acute intracranial abnormality, mass  lesion, or abnormal enhancement.  Original Report Authenticated By: Britta Mccreedy, M.D.   US Renal  02/16/2011  *RADIOLOGY REPORT*  Clinical Data: Cystic kidney disease.  Acute renal failure.  High blood pressure.  Diabetic.  RENAL/URINARY TRACT ULTRASOUND COMPLETE  Comparison:  02/14/2011 CT.  12/14/2010 ultrasound.  Findings:  Right Kidney:  10.2 cm.  4.3 mm lower pole nonobstructing stone. No hydronephrosis.  Left Kidney:  10.3 cm. Slightly complex left lower pole 3.9 x 3.2 x 3 cm cyst.  No hydronephrosis.  Bladder:  Decompressed without obvious mass.  IMPRESSION: 4.3 mm nonobstructing right lower pole renal calculus.  Left lower pole slightly complex 3.9 cm cystic structure.  Original Report Authenticated By: Fuller Canada, M.D.   Ct Entero Abd/pelvis W/cm  02/19/2011  *RADIOLOGY REPORT*  Clinical Data:  Small bowel obstruction.  Blood in stool.  Question small bowel source.  CT ABDOMEN AND PELVIS WITH CONTRAST (CT ENTEROGRAPHY)  Technique:  Multidetector CT of the abdomen and  pelvis during bolus administration of intravenous contrast. Negative oral contrast VoLumen was given.  Contrast: OMNIPAQUE IOHEXOL 300 MG/ML IV SOLN  Comparison:  02/13/2009  Findings: The stomach is well distended.  No abnormal mucosal or mural enhancement in the stomach.  The duodenum is normal.  There is no abnormal enhancement and small bowel.  No small bowel obstruction.  No evidence for small bowel stricture.  No small bowel mass lesion is evident.  Terminal ileum is normal. Diverticular changes seen in the left colon without diverticulitis. The colon is otherwise unremarkable.  The appendix is normal.  Liver and spleen have normal imaging features.  The pancreas gallbladder and adrenal glands are unremarkable.  7 ml nonobstructing stone is seen in the lower pole of the right kidney. 3.8 cm cystic lesion in the left kidney measures slightly higher in attenuation would be expected for a simple cyst, but is unchanged in  size comparing back to 02/27/2009.  No abdominal aortic aneurysm.  No evidence for lymphadenopathy in the abdomen or pelvis.  Bone windows reveal no worrisome lytic or sclerotic osseous lesions.  IMPRESSION: No small bowel abnormality to explain the patient's history of rectal bleeding.  The small bowel obstructive pattern seen on the previous CT scan from 5 days ago has resolved in the interval.  There is some mild distention of right-sided small bowel loops on today's study, but this is not outside of the spectrum for c t enterography.  Original Report Authenticated By: ERIC A. MANSELL, M.D.   Microbiology: Recent Results (from the past 240 hour(s))  MRSA PCR SCREENING     Status: Normal   Collection Time   02/14/11  6:36 AM      Component Value Range Status Comment   MRSA by PCR NEGATIVE  NEGATIVE  Final   URINE CULTURE     Status: Normal   Collection Time   02/15/11 11:15 AM      Component Value Range Status Comment   Specimen Description URINE, CLEAN CATCH   Final    Special Requests NONE   Final    Setup Time [id number]   Final    Colony Count NO GROWTH   Final    Culture NO GROWTH   Final    Report Status 02/16/2011 FINAL   Final      Labs: Basic Metabolic Panel:  Lab 02/20/11 8119 02/19/11 0540 02/18/11 0600 02/17/11 0645 02/16/11 0620  NA 137 139 141 138 144  K 3.6 3.6 -- -- --  CL 105 109 111 108 114*  CO2 24 24 23 23 22   GLUCOSE 100* 93 90 83 86  BUN 5* 7 11 26* 66*  CREATININE 0.93 0.97 1.02 1.20 2.14*  CALCIUM 8.3* 8.1* 8.2* 7.9* 8.5  MG -- -- -- -- --  PHOS -- -- -- -- 3.8   Liver Function Tests:  Lab 02/16/11 0620  AST 21  ALT 14  ALKPHOS 62  BILITOT 0.6  PROT 6.6  ALBUMIN 3.2*   CBC:  Lab 02/21/11 0538 02/20/11 1135 02/20/11 0816 02/20/11 0237 02/19/11 2036 02/16/11 0620  WBC 6.1 5.6 6.1 5.0 5.6 --  NEUTROABS -- -- -- -- -- 6.1  HGB 8.1* 8.1* 9.0* 7.7* 8.3* --  HCT 24.2* 24.1* 26.6* 22.8* 24.1* --  MCV 95.3 93.8 94.0 93.1 93.8 --  PLT 141* 133*  154 133* 145* --   Time coordinating discharge: 35 minutes.  Signed:  Brendia Sacks, MD  [facility] 02/22/2011, 5:03 PM

## 2011-02-22 NOTE — Progress Notes (Signed)
PROGRESS NOTE  BENEDICT KUE WUJ:[id number] DOB: [birth year] DOA: 02/13/2011 PCP: Pearson Grippe, MD, MD  Brief narrative: 77 year old man recently discharged from the hospital her diagnoses including syncope secondary to orthostatic hypotension, acute renal failure secondary to diuretic therapy who presented to the hospital with abdominal pain and multiple episodes of nausea and vomiting. He was admitted for further evaluation for abdominal pain, hypercalcemia. CT scan on admission demonstrated a small bowel obstruction.  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 Minimal gastritis.  Interim History: Interim documentation reviewed. General surgery has signed off. Gastroenterology has signed off. Subjective: Feels good. Home.  Objective:  Filed Vitals:   02/21/11 2047 02/22/11 0500 02/22/11 1113 02/22/11 1333  BP: 153/74 136/78 148/71 146/70  Pulse: 59 65 65 61  Temp: 97.6 F (36.4 C) 98.2 F (36.8 C)  98.7 F (37.1 C)  TempSrc: Oral Oral  Oral  Resp: 19 18  19   Height:      Weight:  71.532 kg (157 lb 11.2 oz)    SpO2: 100% 96%  96%    Intake/Output Summary (Last 24 hours) at 02/22/11 1603 Last data filed at 02/22/11 1548  Gross per 24 hour  Intake   1440 ml  Output     200 ml  Net   1240 ml    Exam: General: Appears calm and comfortable.  Cardiovascular: Regular rate and rhythm. No murmur, rub or gallop. No significant lower extremity edema. Respiratory: Clear to auscultation bilaterally. No wheezes, rales or rhonchi. Normal respiratory effort.  Exam remains current December 13.  Data Reviewed: Basic Metabolic Panel:  Lab 02/20/11 9562 02/19/11 0540 02/18/11 0600 02/17/11 0645 02/16/11 0620  NA 137 139 141 138 144  K 3.6 3.6 -- -- --  CL 105 109 111 108 114*  CO2 24 24 23 23 22   GLUCOSE 100* 93 90 83 86  BUN 5* 7 11 26* 66*  CREATININE 0.93 0.97 1.02 1.20 2.14*  CALCIUM 8.3* 8.1* 8.2* 7.9* 8.5  MG -- -- -- -- --  PHOS -- -- -- -- 3.8   CBC:  Lab 02/21/11 0538 02/20/11 1135 02/20/11 0816 02/20/11 0237 02/19/11 2036 02/16/11 0620  WBC 6.1 5.6 6.1 5.0 5.6 --  NEUTROABS -- -- -- -- -- 6.1  HGB 8.1* 8.1* 9.0* 7.7* 8.3* --  HCT 24.2* 24.1* 26.6* 22.8* 24.1* --  MCV 95.3 93.8 94.0 93.1 93.8 --  PLT 141* 133* 154 133* 145* --   Scheduled Meds:    . digoxin  125 mcg Oral Daily  . lisinopril  10 mg Oral Daily  . metoprolol  25 mg Oral Daily  . pantoprazole  40 mg Oral Q1200   Continuous Infusions:     Assessment/Plan: 1. Small bowel obstruction: Resolved with supportive care.  2. Questionable melena: GI prefers conservative approach at this point. CBC stable. 3. Questionable Acute blood loss anemia: Appears to be stable at this point.  4. Thrombocytopenia: Minimal. Stable. Followup  as an outpatient. 5. Hypercalcemia: Resolved with IV fluids. 6. Acute renal failure: Nephrosclerosis suspected. Acute injury likely secondary hypotension. Resolved with IV fluids. 7. History of atrial fibrillation on warfarin: Stable. Resume Coumadin as an outpatient. Continue metoprolol and digoxin. 8. Chronic systolic heart failure: Ejection fraction 25%.  Code Status: Full  Disposition Plan: Home December 13.   Brendia Sacks, MD  [facility] Pager [phone] 02/22/2011, 4:03 PM    LOS: 9 days

## 2011-03-19 ENCOUNTER — Encounter: Payer: Self-pay | Admitting: *Deleted

## 2011-03-19 ENCOUNTER — Encounter: Payer: Self-pay | Admitting: Radiation Oncology

## 2011-03-19 DIAGNOSIS — C61 Malignant neoplasm of prostate: Secondary | ICD-10-CM | POA: Insufficient documentation

## 2011-03-19 DIAGNOSIS — Z950 Presence of cardiac pacemaker: Secondary | ICD-10-CM | POA: Insufficient documentation

## 2011-03-19 NOTE — Progress Notes (Unsigned)
01/09/11 I-PSS Results  = [id number] score=7

## 2011-03-19 NOTE — Progress Notes (Signed)
78 year old male. Widower. 3 of 4 children living. 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  Diagnosed in February 2010 with a PSA of 11.6 and Gleason score of 7 but, elected for observation. September 2012 PSA went up to 16.94 ad he underwent a repeat biopsy on 11/01/10. Biopsy proved T2c versus T3 high risk adenocarcinoma of the prostate with Gleason scores ranging from 7-9. Bone scan on 11-16-10 negative and CT of abd./pelvis was without metastatic disease. Patient opted for definitive radiation therapy/IMRT along with androgen deprivation for 2 years. Gold seed markers placed 02/08/11.   Patient discharged from the hospital 02/22/11 reference small bowel obstruction which resolved with supportive care only.   Scheduled with Dr. Dayton Scrape 03/20/2011 for follow up new consultation. Simulation to be scheduled.   AX: Aspirin No hx of radiation therapy in the past.  PACEMAKER (need to obtain card)

## 2011-03-20 ENCOUNTER — Encounter: Payer: Self-pay | Admitting: Radiation Oncology

## 2011-03-20 ENCOUNTER — Ambulatory Visit
Admission: RE | Admit: 2011-03-20 | Discharge: 2011-03-20 | Disposition: A | Discharge status: Home / Self Care | Payer: PRIVATE HEALTH INSURANCE | Source: Ambulatory Visit | Attending: Radiation Oncology | Admitting: Radiation Oncology

## 2011-03-20 ENCOUNTER — Telehealth: Payer: Self-pay | Admitting: Internal Medicine

## 2011-03-20 ENCOUNTER — Ambulatory Visit: Payer: PRIVATE HEALTH INSURANCE | Admitting: Radiation Oncology

## 2011-03-20 VITALS — BP 133/86 | HR 61 | Temp 97.4°F | Resp 20 | Wt 147.1 lb

## 2011-03-20 DIAGNOSIS — C61 Malignant neoplasm of prostate: Secondary | ICD-10-CM

## 2011-03-20 DIAGNOSIS — Z9981 Dependence on supplemental oxygen: Secondary | ICD-10-CM | POA: Insufficient documentation

## 2011-03-20 HISTORY — DX: Dependence on supplemental oxygen: Z99.81

## 2011-03-20 NOTE — Telephone Encounter (Signed)
New problem [facility] heart failure program called He said pt has had 7.8 pound weight drop. please call back about treatment plan

## 2011-03-20 NOTE — Progress Notes (Signed)
Encounter addended by: Ardell Isaacs on: 03/20/2011 12:19 PM<BR>     Documentation filed: Charges VN

## 2011-03-20 NOTE — Telephone Encounter (Signed)
Returned call to [facility] after discussing with Dr [name].  No change  Continue to monitor

## 2011-03-20 NOTE — Progress Notes (Signed)
Please see the Nurse Progress Note in the MD Initial Consult Encounter for this patient. 

## 2011-03-20 NOTE — Progress Notes (Signed)
Followup note: Diagnosis: Clinical stage T2 C. versus T3 high risk adenocarcinoma prostate.  [name] visits today for review and scheduling of his radiation therapy in the management of his high risk adenocarcinoma prostate. I first saw [name] in consultation on 01/09/2011 a. He presented with an elevated PSA of 16.94 and Gleason 9 (4+5) disease. His staging workup was without evidence for metastatic disease. Dr. Brunilda Payor initiated androgen deprivation therapy in early November. He had placement of 3 gold seed markers on November 29. Since placement of his gold seed markers he does report right perineal discomfort which is relieved with Tylenol. He is doing well from a GU and GI standpoint. His I PSS score today is 3. He was hospitalized in early December 4 small bowel obstruction and a history of syncopal episodes. His small bowel obstruction resolved with conservative management. There was no obvious explanation for his syncopal episodes. Of note is that he does have atrial fibrillation and he is on Coumadin.  Physical examination: There is discomfort along the right perineum and adjacent to the scrotum. Rectal: The prostate gland remains nodular and a small. There is less nodularity along the right lobe although there is still some distortion of the right lateral sulcus.  Impression: High-risk adenocarcinoma prostate. I again discussed the potential acute and late toxicities of radiation therapy and he wishes to proceed as outlined. His daughter was in attendance. Consent was previously signed on 01/09/2011. I'll have him return next week for simulation/treatment planning. We will treat his prostate and regional lymph nodes based on his high-risk disease.  30 minutes was spent face-to-face with the patient, primarily counseling the patient and coordinating his care.

## 2011-03-20 NOTE — Progress Notes (Signed)
Gold seed markers placed 02/08/11  Nocturia x 2-3, occasional dysuria, denies weak stream and starting-stopping, feels as if he empties bladder.  Pt states he began having penile pain after gold seeds placed, comes and goes, Tylenol prn w/good relief.

## 2011-03-22 ENCOUNTER — Encounter: Payer: PRIVATE HEALTH INSURANCE | Admitting: *Deleted

## 2011-03-24 ENCOUNTER — Emergency Department (HOSPITAL_COMMUNITY): Payer: PRIVATE HEALTH INSURANCE

## 2011-03-24 ENCOUNTER — Encounter (HOSPITAL_COMMUNITY): Payer: Self-pay

## 2011-03-24 ENCOUNTER — Other Ambulatory Visit: Payer: Self-pay

## 2011-03-24 ENCOUNTER — Emergency Department (HOSPITAL_COMMUNITY)
Admission: EM | Admit: 2011-03-24 | Discharge: 2011-03-24 | Disposition: A | Discharge status: Home / Self Care | Payer: PRIVATE HEALTH INSURANCE | Attending: Emergency Medicine | Admitting: Emergency Medicine

## 2011-03-24 DIAGNOSIS — E7800 Pure hypercholesterolemia, unspecified: Secondary | ICD-10-CM | POA: Insufficient documentation

## 2011-03-24 DIAGNOSIS — Z950 Presence of cardiac pacemaker: Secondary | ICD-10-CM | POA: Insufficient documentation

## 2011-03-24 DIAGNOSIS — M79609 Pain in unspecified limb: Secondary | ICD-10-CM | POA: Insufficient documentation

## 2011-03-24 DIAGNOSIS — Z862 Personal history of diseases of the blood and blood-forming organs and certain disorders involving the immune mechanism: Secondary | ICD-10-CM | POA: Insufficient documentation

## 2011-03-24 DIAGNOSIS — Z8673 Personal history of transient ischemic attack (TIA), and cerebral infarction without residual deficits: Secondary | ICD-10-CM | POA: Insufficient documentation

## 2011-03-24 DIAGNOSIS — R791 Abnormal coagulation profile: Secondary | ICD-10-CM

## 2011-03-24 DIAGNOSIS — M7989 Other specified soft tissue disorders: Secondary | ICD-10-CM | POA: Insufficient documentation

## 2011-03-24 DIAGNOSIS — E119 Type 2 diabetes mellitus without complications: Secondary | ICD-10-CM | POA: Insufficient documentation

## 2011-03-24 DIAGNOSIS — R42 Dizziness and giddiness: Secondary | ICD-10-CM | POA: Insufficient documentation

## 2011-03-24 DIAGNOSIS — R55 Syncope and collapse: Secondary | ICD-10-CM

## 2011-03-24 DIAGNOSIS — M79606 Pain in leg, unspecified: Secondary | ICD-10-CM

## 2011-03-24 DIAGNOSIS — I10 Essential (primary) hypertension: Secondary | ICD-10-CM | POA: Insufficient documentation

## 2011-03-24 DIAGNOSIS — Z8639 Personal history of other endocrine, nutritional and metabolic disease: Secondary | ICD-10-CM | POA: Insufficient documentation

## 2011-03-24 DIAGNOSIS — Z79899 Other long term (current) drug therapy: Secondary | ICD-10-CM | POA: Insufficient documentation

## 2011-03-24 DIAGNOSIS — I252 Old myocardial infarction: Secondary | ICD-10-CM | POA: Insufficient documentation

## 2011-03-24 DIAGNOSIS — Z7901 Long term (current) use of anticoagulants: Secondary | ICD-10-CM | POA: Insufficient documentation

## 2011-03-24 DIAGNOSIS — Z8546 Personal history of malignant neoplasm of prostate: Secondary | ICD-10-CM | POA: Insufficient documentation

## 2011-03-24 DIAGNOSIS — I2510 Atherosclerotic heart disease of native coronary artery without angina pectoris: Secondary | ICD-10-CM | POA: Insufficient documentation

## 2011-03-24 DIAGNOSIS — I509 Heart failure, unspecified: Secondary | ICD-10-CM | POA: Insufficient documentation

## 2011-03-24 LAB — DIFFERENTIAL
Basophils Absolute: 0 10*3/uL (ref 0.0–0.1)
Eosinophils Absolute: 0.1 10*3/uL (ref 0.0–0.7)
Eosinophils Relative: 1 % (ref 0–5)
Lymphocytes Relative: 18 % (ref 12–46)
Neutrophils Relative %: 72 % (ref 43–77)

## 2011-03-24 LAB — PROTIME-INR
INR: 1.81 — ABNORMAL HIGH (ref 0.00–1.49)
Prothrombin Time: 21.3 seconds — ABNORMAL HIGH (ref 11.6–15.2)

## 2011-03-24 LAB — BASIC METABOLIC PANEL
CO2: 26 mEq/L (ref 19–32)
Calcium: 10 mg/dL (ref 8.4–10.5)
GFR calc non Af Amer: 52 mL/min — ABNORMAL LOW (ref >90)
Sodium: 134 mEq/L — ABNORMAL LOW (ref 135–145)

## 2011-03-24 LAB — TROPONIN I: Troponin I: <0.3 ng/mL (ref <0.30)

## 2011-03-24 LAB — CBC
MCV: 90.8 fL (ref 78.0–100.0)
Platelets: 139 10*3/uL — ABNORMAL LOW (ref 150–400)
RDW: 16.7 % — ABNORMAL HIGH (ref 11.5–15.5)
WBC: 5.9 10*3/uL (ref 4.0–10.5)

## 2011-03-24 MED ORDER — HYDROCODONE-ACETAMINOPHEN 5-500 MG PO TABS: 1.0000 | ORAL_TABLET | Freq: Four times a day (QID) | ORAL | Status: AC | PRN

## 2011-03-24 MED ORDER — IOHEXOL 350 MG/ML SOLN
50.0000 mL | Freq: Once | INTRAVENOUS | Status: AC | PRN
  Administered 2011-03-24: 50 mL via INTRAVENOUS

## 2011-03-24 MED ORDER — MORPHINE SULFATE 4 MG/ML IJ SOLN
4.0000 mg | Freq: Once | INTRAMUSCULAR | Status: AC
  Administered 2011-03-24: 4 mg via INTRAVENOUS
  Filled 2011-03-24: qty 1

## 2011-03-24 NOTE — ED Notes (Signed)
Patient transported to X-ray 

## 2011-03-24 NOTE — ED Notes (Signed)
Pt return from ct scan. NAD noted

## 2011-03-24 NOTE — ED Notes (Signed)
Pt placed on cardiac monitor, bp cuff, and pulse ox.

## 2011-03-24 NOTE — ED Notes (Signed)
Pt stated his pacemaker/defib "felt funny" prior to syncopal episodes. Pt has a St.Jude pacer/defib. RN aware

## 2011-03-24 NOTE — ED Notes (Signed)
Pt return from xray. Doppler at bedside. Monitors intact. Pt alertx3 resp easy non-labored. MAE randomly.

## 2011-03-24 NOTE — ED Provider Notes (Signed)
History     CSN: [id number]  Arrival date & time 03/24/11  0719   First MD Initiated Contact with Patient 03/24/11 [phone]      Chief Complaint  Patient presents with  . Leg Pain  . Dizziness    (Consider location/radiation/quality/duration/timing/severity/associated sxs/prior treatment) HPI  71yoM ho prostate cancer, lung cancer, COPD on 2 L baseline oxygen, CHF atrial fibrillation on Coumadin and digoxin presents with multiple complaints. The patient states that he's had multiple syncopal episodes over the past one week. He states that his pacemaker "felt funny" before the episodes. He had a similar episode yesterday where he felt lightheaded after standing up from bed and fell back onto the bed without syncope. He states that this past and has not felt lightheaded or dizzy since that time. He denies chest pain, shortness of breath. He denies palpitations. He denies fevers, chills, cough. Denies abdominal pain, nausea, vomiting. When asked why he came in today patient states that the main reason why he came in is because he's having burning, stinging, and swelling of his right lower extremity. It has been present for approximately one week and getting worse. He states he's been taking tramadol for the pain with minimal relief. He rates his pain as an 8/10 at this time.  ED Notes, ED Provider Notes from 03/24/11 0000 to 03/24/11 07:48:07       Aretha Parrot, EMT 03/24/2011 07:39      Pt stated his pacemaker/defib "felt funny" prior to syncopal episodes. Pt has a St.Jude pacer/defib. RN aware         Volney Presser, RN 03/24/2011 07:38      Pt states near syncope for one week. States had multiple episodes this week. Pt states pacemaker "felt funny" before episodes. Pt alert x3 MAE randomly.         Aretha Parrot, EMT 03/24/2011 07:33      Pt placed on cardiac monitor, bp cuff, and pulse ox     Past Medical History  Diagnosis Date  . Diabetes mellitus   . Hypertension   . CHF  (congestive heart failure)   . CVA (cerebral vascular accident)   . MI (myocardial infarction)   . Pacemaker   . S/P CABG (coronary artery bypass graft)   . Gout   . Nephrolithiasis   . Prostate cancer, recur risk not determined whether low, med or high   . Coronary artery disease   . Hypercholesterolemia   . Black lung disease   . Prostate cancer 11/01/10    gleason 7, 8, 9, gold seeds 02/08/11  . On home oxygen therapy     uses prn, most often in hot weather    Past Surgical History  Procedure Date  . Colonoscopy   . Icd insertion     status post BIV  . Coronary artery bypass graft   . Esophagogastroduodenoscopy 02/19/2011    Procedure: ESOPHAGOGASTRODUODENOSCOPY (EGD);  Surgeon: Petra Kuba, MD;  Location: [facility] ENDOSCOPY;  Service: Endoscopy;  Laterality: N/A;  . Upper endoscpopy     Family History  Problem Relation Age of Onset  . Alzheimer's disease Father 77  . Cancer Father 29    metastatic prostate cancer  . Diabetes Sister 85  . Diabetes Brother   . Diabetes Brother   . Diabetes Brother   . Hypotension Neg Hx   . Malignant hyperthermia Neg Hx   . Pseudochol deficiency Neg Hx     History  Substance Use Topics  .  Smoking status: Never Smoker   . Smokeless tobacco: Current User    Types: Chew  . Alcohol Use: No     drank until 7 mos ago, per pt 03/19/11    Review of Systems  All other systems reviewed and are negative.  except as noted HPI  Allergies  Aspirin  Home Medications   Current Outpatient Rx  Name Route Sig Dispense Refill  . ALLOPURINOL 300 MG PO TABS Oral Take 300 mg by mouth daily.      . ATORVASTATIN CALCIUM 20 MG PO TABS Oral Take 20 mg by mouth daily.      [name] CALCIUM-VITAMIN D PO Oral Take 1 tablet by mouth daily.      [name] VITAMIN B-12 PO Oral Take 1 tablet by mouth daily.      [name] DIGOXIN 0.125 MG PO TABS Oral Take 125 mcg by mouth daily.      . OMEGA-3 FATTY ACIDS 1000 MG PO CAPS Oral Take 2 g by mouth daily.      . FUROSEMIDE 20 MG  PO TABS Oral Take 20 mg by mouth daily.      . ISOSORBIDE MONONITRATE ER 30 MG PO TB24 Oral Take 30 mg by mouth daily.      [name] LEVOFLOXACIN 500 MG PO TABS Oral Take 500 mg by mouth daily. Started on 03/22/11 for 7 days    . LISINOPRIL 10 MG PO TABS Oral Take 10 mg by mouth daily.      [name] METOPROLOL SUCCINATE ER 50 MG PO TB24 Oral Take 50 mg by mouth daily.      [name] OMEPRAZOLE 20 MG PO CPDR Oral Take 20 mg by mouth daily.      [name] POTASSIUM CHLORIDE CRYS ER 20 MEQ PO TBCR Oral Take 20 mEq by mouth 2 (two) times daily.      . TRAMADOL HCL 50 MG PO TABS Oral Take 50 mg by mouth every 6 (six) hours as needed. For pain.    . WARFARIN SODIUM 2.5 MG PO TABS Oral Take 2.5 mg by mouth daily. Take one tablet everyday except Friday. Take one-half on Friday.    [name] HYDROCODONE-ACETAMINOPHEN 5-500 MG PO TABS Oral Take 1 tablet by mouth every 6 (six) hours as needed for pain. 20 tablet 0    BP 139/71  Pulse 63  Temp(Src) 97.6 F (36.4 C) (Oral)  Resp 20  SpO2 100%  Physical Exam  Nursing note and vitals reviewed. Constitutional: He is oriented to person, place, and time. He appears well-developed and well-nourished. No distress.  HENT:  Head: Atraumatic.  Mouth/Throat: Oropharynx is clear and moist.  Eyes: Conjunctivae are normal. Pupils are equal, round, and reactive to light.  Neck: Neck supple.  Cardiovascular: Normal rate, regular rhythm, normal heart sounds and intact distal pulses.  Exam reveals no gallop and no friction rub.   No murmur heard. Pulmonary/Chest: Effort normal. No respiratory distress. He has no wheezes. He has no rales.  Abdominal: Soft. Bowel sounds are normal. There is no tenderness. There is no rebound and no guarding.  Musculoskeletal: Normal range of motion. He exhibits tenderness. He exhibits no edema.       Diffuse ttp RLE No erythema  Neurological: He is alert and oriented to person, place, and time.  Skin: Skin is warm and dry.  Psychiatric: He has a normal mood and  affect.    Date: 03/24/2011  Rate: 62  Rhythm: ventricular pacemaker  Old EKG Reviewed: No significant change  ED Course  Procedures (including critical care time)  Labs Reviewed  CBC - Abnormal; Notable for the following:    RBC 3.46 (*)    Hemoglobin 10.5 (*)    HCT 31.4 (*)    RDW 16.7 (*)    Platelets 139 (*)    All other components within normal limits  BASIC METABOLIC PANEL - Abnormal; Notable for the following:    Sodium 134 (*)    Glucose, Bld 116 (*)    GFR calc non Af Amer 52 (*)    GFR calc Af Amer 60 (*)    All other components within normal limits  PROTIME-INR - Abnormal; Notable for the following:    Prothrombin Time 21.3 (*)    INR 1.81 (*)    All other components within normal limits  DIGOXIN LEVEL - Abnormal; Notable for the following:    Digoxin Level 0.7 (*)    All other components within normal limits  DIFFERENTIAL  TROPONIN I  TROPONIN I   Dg Chest 2 View  03/24/2011  *RADIOLOGY REPORT*  Clinical Data: Dizziness.  Right hip and leg pain.  CHEST - 2 VIEW  Comparison: Plain films of the chest 02/13/2011 and CT chest 08/29/2010.  Findings: AICD is again noted.  Lungs are clear.  Mild cardiomegaly is present.  IMPRESSION: No acute finding.  Original Report Authenticated By: Bernadene Bell. Maricela Curet, M.D.   Ct Angio Chest W/cm &/or Wo Cm  03/24/2011  *RADIOLOGY REPORT*  Clinical Data: Syncope.  History of prostate cancer, lung cancer, COPD.  The patient is on 2 liters of oxygen.  Congestive heart failure.  Atrial fibrillation.  The patient is on Coumadin, digoxin.  Multiple syncopal episodes.  Recent near syncope.  CT ANGIOGRAPHY CHEST  Technique:  Multidetector CT imaging of the chest using the standard protocol during bolus administration of intravenous contrast. Multiplanar reconstructed images including MIPs were obtained and reviewed to evaluate the vascular anatomy.  Contrast: 50mL OMNIPAQUE IOHEXOL 350 MG/ML IV SOLN  Comparison: CT angio chest 08/29/2010   Findings: The pulmonary arteries are well opacified.  There is no evidence for acute pulmonary embolus.  Heart is enlarged. Again noted is calcification along the left ventricular apex, stable in appearance. The patient has had median sternotomy with CABG.  Pacemaker is in place with leads to the coronary sinus and right ventricle.  There are mild patchy parenchymal changes most consistent with mild into interstitial edema.  Parenchymal changes in the right midlung zone appears stable and likely related to asbestosis.  Calcified pleural plaques are identified.  There are slightly prominent anterior mediastinal lymph nodes, stable in appearance.  Images of the upper abdomen show colonic diverticula.  There are calcified diaphragmatic nodes.  IMPRESSION:  1.  Technically adequate exam showing no evidence for acute pulmonary embolus. 2.  Cardiomegaly with postoperative changes and stable left ventricular calcification. 3.  Stable changes of asbestosis. 4.  Stable changes within the right midlung zone. 5.  Probable mild pulmonary edema.  Original Report Authenticated By: Patterson Hammersmith, M.D.    1. Leg pain   2. Syncope   3. Subtherapeutic international normalized ratio (INR)    MDM   Patient is a poor historian. He presents with multiple complaints including recent syncopal episodes and vague feeling of pacemaker, defibrillator dysfunction. Also with right lower extremity pain I doubt that his Coumadin is therapeutic that this will turn out to be a DVT but will obtain right lower extremity ultrasound. Pacemaker interrogation. Syncope work  up. Pain control. Reassess.  RLS duplex wnl. Pt states he feels better but pain not completely resolved. Denies CP/SOB in ED. Orthostatic and h/o orthostatic hypotension. Pacemaker interogated, unremarkable-- see St. Jude full note. CTA chest ordered. If negative for PE, home with pain control.  CT negative for PE. Mild pulm edema. The patient is not fluid overloaded  clinically and not c/o shortness of breath. Will have him f/u with cardiology. To take 5mg  of warfarin tonight instead of base does 2.5mg . To have INR rechecked  Next week as schedule. Patient and family comfortable with plan to discharge home.       Forbes Cellar, MD 03/24/11 1429

## 2011-03-24 NOTE — ED Notes (Signed)
Pt states near syncope for one week. States had multiple episodes this week. Pt states pacemaker "felt funny" before episodes. Pt alert x3 MAE randomly.

## 2011-03-24 NOTE — ED Notes (Signed)
Patient transported to MRI 

## 2011-03-24 NOTE — ED Notes (Signed)
St Judes at bedside for defibrillator interrogation.

## 2011-03-26 ENCOUNTER — Encounter: Payer: Self-pay | Admitting: *Deleted

## 2011-03-27 ENCOUNTER — Ambulatory Visit
Admission: RE | Admit: 2011-03-27 | Discharge: 2011-03-27 | Disposition: A | Discharge status: Home / Self Care | Payer: PRIVATE HEALTH INSURANCE | Source: Ambulatory Visit | Attending: Radiation Oncology | Admitting: Radiation Oncology

## 2011-03-27 DIAGNOSIS — C61 Malignant neoplasm of prostate: Secondary | ICD-10-CM

## 2011-03-27 NOTE — Progress Notes (Signed)
Simulation/treatment planning note:  The patient was taken to the CT simulator. He was placed supine. An alpha cradle was constructed for immobilization. A red rubber catheter was placed within the rectal vault. He was then catheterized and a urethrogram was performed. His pelvis was scanned. I contoured his prostate, seminal vesicles, and pelvic lymph nodes. The bladder, rectum, and femoral head were also contoured. I prescribing 7800 cGy in 40 sessions to his prostate PTV which represents a prostate +0.8 cm except for 0.5 cm along the rectum. I'm prescribing 5600 cGy in 40 sessions to his seminal vesicles and lymph node PTV. He is to be treated with a comfortably bladder. I requesting daily MV CT, setting up to his gold seeds.

## 2011-03-27 NOTE — Progress Notes (Signed)
Met with patient to discuss RO billing. Patient was given epp - will return at next visit.

## 2011-03-30 ENCOUNTER — Telehealth: Payer: Self-pay | Admitting: Internal Medicine

## 2011-03-30 ENCOUNTER — Encounter: Payer: Self-pay | Admitting: Internal Medicine

## 2011-03-30 ENCOUNTER — Ambulatory Visit (INDEPENDENT_AMBULATORY_CARE_PROVIDER_SITE_OTHER): Payer: PRIVATE HEALTH INSURANCE | Admitting: *Deleted

## 2011-03-30 DIAGNOSIS — I428 Other cardiomyopathies: Secondary | ICD-10-CM

## 2011-03-30 DIAGNOSIS — Z95810 Presence of automatic (implantable) cardiac defibrillator: Secondary | ICD-10-CM

## 2011-03-30 LAB — REMOTE ICD DEVICE
BRDY-0004RV: 120 {beats}/min
HV IMPEDENCE: 40 Ohm
LV LEAD IMPEDENCE ICD: 380 Ohm
RV LEAD AMPLITUDE: 11.6 mv
RV LEAD IMPEDENCE ICD: 440 Ohm
TZAT-0004FASTVT: 8
TZAT-0004SLOWVT: 8
TZAT-0012FASTVT: 200 ms
TZAT-0012SLOWVT: 200 ms
TZAT-0013SLOWVT: 3
TZAT-0018FASTVT: NEGATIVE
TZAT-0018SLOWVT: NEGATIVE
TZAT-0019FASTVT: 7.5 V
TZAT-0020FASTVT: 1 ms
TZON-0003FASTVT: 300 ms
TZON-0003SLOWVT: 375 ms
TZON-0004FASTVT: 12
TZON-0005FASTVT: 6
TZON-0010FASTVT: 80 ms
TZST-0001FASTVT: 3
TZST-0001FASTVT: 4
TZST-0001SLOWVT: 3
TZST-0001SLOWVT: 5
TZST-0003FASTVT: 36 J
TZST-0003SLOWVT: 25 J
TZST-0003SLOWVT: 36 J
VENTRICULAR PACING ICD: 100 pct

## 2011-03-30 NOTE — Telephone Encounter (Signed)
New problem:  Status of pacer maker form that was Fax  On 1/8. - radiation treatment

## 2011-04-02 ENCOUNTER — Encounter: Payer: Self-pay | Admitting: Radiation Oncology

## 2011-04-02 NOTE — Progress Notes (Signed)
IMRT simulation/treatment planning:  The patient completed IMRT simulation/treatment planning today in the management of his carcinoma of the prostate. IMRT was chosen to decrease the risk for bladder and rectal toxicity compared to conventional or 3-D conformal radiation therapy. Dose volume histograms were obtained for the target structures including the prostate, seminal vesicles, and lymph nodes. Dose volume histograms were also obtained for the avoidance structures including the bladder, and rectum. We met our target goals all he had a slightly higher dose to the rectum in the intermediate dose range. This was because of the rectal contour. I prescribing 7800 cGy 40 sessions to the prostate PTV which are persistent prostate +0.8 cm except for 0.5 cm along the rectum. I am prescribing 5600 cGy in 40 sessions to the seminal vesicles, and pelvic lymph nodes. He'll undergo daily MV CT for image guidance setting up to his 3 gold seeds. He is to be treated with a comfortably full bladder.

## 2011-04-03 NOTE — Telephone Encounter (Signed)
This form was faxed last week  They are going to re-fax and I will have them re-fax to me so he can sign again

## 2011-04-03 NOTE — Telephone Encounter (Signed)
Re-faxed.

## 2011-04-04 ENCOUNTER — Encounter: Payer: Self-pay | Admitting: *Deleted

## 2011-04-05 ENCOUNTER — Ambulatory Visit
Admission: RE | Admit: 2011-04-05 | Discharge: 2011-04-05 | Disposition: A | Discharge status: Home / Self Care | Payer: PRIVATE HEALTH INSURANCE | Source: Ambulatory Visit | Attending: Radiation Oncology | Admitting: Radiation Oncology

## 2011-04-05 ENCOUNTER — Encounter: Payer: Self-pay | Admitting: Radiation Oncology

## 2011-04-05 DIAGNOSIS — C61 Malignant neoplasm of prostate: Secondary | ICD-10-CM

## 2011-04-05 NOTE — Progress Notes (Signed)
Received patient and his daughter, [name], in the clinic today for post sim education. Patient is alert and oriented to person, place, and time. No distress noted. Steady gait noted. Pleasant affect noted. Patient denies pain at this time. Oriented patient to staff and routine of the clinic. Provided patient with RADIATION THERAPY AND YOU handbook. Reviewed pertinent information within the handbook with the patient and his daughter. Provided patient with this writer's business card and encouraged to call with needs. Both verbalized understanding of all things reviewed.

## 2011-04-05 NOTE — Progress Notes (Signed)
Chart note  The patient underwent Tomotherapy segmentation for his IMRT in the management of his carcinoma of the prostate. He is being treated to 9.3 delivered field widths corresponding to one set of IMRT treatment devices [phone]).

## 2011-04-06 ENCOUNTER — Ambulatory Visit
Admission: RE | Admit: 2011-04-06 | Discharge: 2011-04-06 | Disposition: A | Discharge status: Home / Self Care | Payer: PRIVATE HEALTH INSURANCE | Source: Ambulatory Visit | Attending: Radiation Oncology | Admitting: Radiation Oncology

## 2011-04-09 ENCOUNTER — Ambulatory Visit
Admission: RE | Admit: 2011-04-09 | Discharge: 2011-04-09 | Disposition: A | Discharge status: Home / Self Care | Payer: PRIVATE HEALTH INSURANCE | Source: Ambulatory Visit | Attending: Radiation Oncology | Admitting: Radiation Oncology

## 2011-04-09 ENCOUNTER — Encounter: Payer: Self-pay | Admitting: Radiation Oncology

## 2011-04-09 VITALS — BP 146/78 | HR 60 | Resp 18 | Wt 148.4 lb

## 2011-04-09 DIAGNOSIS — C61 Malignant neoplasm of prostate: Secondary | ICD-10-CM

## 2011-04-09 DIAGNOSIS — N390 Urinary tract infection, site not specified: Secondary | ICD-10-CM | POA: Insufficient documentation

## 2011-04-09 NOTE — Progress Notes (Signed)
Remote icd check  

## 2011-04-09 NOTE — Progress Notes (Signed)
Weekly Management Note:  Site:Prostate Current Dose:  585  cGy Projected Dose: 7800  cGy  Narrative: The patient is seen today for routine under treatment assessment. CBCT/MVCT images/port films were reviewed. The chart was reviewed.   Bladder filling is satisfactory. No GU or GI difficulties.  Physical Examination:  Filed Vitals:   04/09/11 0908  BP: 146/78  Pulse: 60  Resp: 18  .  Weight: 148 lb 6.4 oz (67.314 kg). No change.  Impression: Tolerating radiation therapy well.  Plan: Continue radiation therapy as planned.

## 2011-04-09 NOTE — Progress Notes (Signed)
Patient presented to the clinic today accompanied by his daughter, [name], for an under treat visit with Dr. Dayton Scrape. Patient is alert and oriented to person, place, and time. No distress noted. Steady gait noted. Pleasant affect noted. Patient denies pain. Patient denies nausea, vomiting, diarrhea or constipation. Patient reports only getting up once last night to void. Patient denies hematuria or burning upon urination. Patient has no complaints at this time. Reported all findings to Dr. Dayton Scrape.

## 2011-04-10 ENCOUNTER — Ambulatory Visit
Admission: RE | Admit: 2011-04-10 | Discharge: 2011-04-10 | Disposition: A | Discharge status: Home / Self Care | Payer: PRIVATE HEALTH INSURANCE | Source: Ambulatory Visit | Attending: Radiation Oncology | Admitting: Radiation Oncology

## 2011-04-11 ENCOUNTER — Ambulatory Visit
Admission: RE | Admit: 2011-04-11 | Discharge: 2011-04-11 | Disposition: A | Discharge status: Home / Self Care | Payer: PRIVATE HEALTH INSURANCE | Source: Ambulatory Visit | Attending: Radiation Oncology | Admitting: Radiation Oncology

## 2011-04-11 ENCOUNTER — Encounter: Payer: Self-pay | Admitting: Radiation Oncology

## 2011-04-11 VITALS — BP 118/73 | HR 73 | Resp 18 | Wt 144.9 lb

## 2011-04-11 DIAGNOSIS — C61 Malignant neoplasm of prostate: Secondary | ICD-10-CM

## 2011-04-11 NOTE — Progress Notes (Signed)
Patient presents to the clinic today accompanied by his daughter requesting to be seen by Dr. Dayton Scrape. Patient is alert and oriented to person, place and time. No distress noted. Steady gait noted. Pleasant affect noted. Patient denies pain at this time. Patient reports intense burning upon urination. Patient denies any other complaints. Reported this findings to Dr. Dayton Scrape.

## 2011-04-11 NOTE — Progress Notes (Signed)
On treatment note:  The patient is seen today with a complaint of mild dysuria on initiation of urination. This began yesterday evening. Thus far his only had 975  cGy in 5 fractions.  Impression: I doubt that he has radiation cystitis or urethra I this at this dose. We need to rule out urinary tract infection.  Plan: UA with possible culture and sensitivities.

## 2011-04-12 ENCOUNTER — Telehealth: Payer: Self-pay | Admitting: Radiation Oncology

## 2011-04-12 ENCOUNTER — Ambulatory Visit
Admission: RE | Admit: 2011-04-12 | Discharge: 2011-04-12 | Disposition: A | Discharge status: Home / Self Care | Payer: PRIVATE HEALTH INSURANCE | Source: Ambulatory Visit | Attending: Radiation Oncology | Admitting: Radiation Oncology

## 2011-04-12 LAB — URINE CULTURE

## 2011-04-12 NOTE — Telephone Encounter (Signed)
Phoned Dawn, patient's daughter/emergency contact,informing her that the urine culture revealed NO infection. Reinforced Dr. Rennie Plowman ordered to take Motrin prn for burning. Instructed Dawn that Dr. Dayton Scrape will re-evaluate the situation on Monday and if it was worse refer him back to his urologist. [name] verbalized understanding.

## 2011-04-13 ENCOUNTER — Ambulatory Visit
Admission: RE | Admit: 2011-04-13 | Discharge: 2011-04-13 | Disposition: A | Discharge status: Home / Self Care | Payer: PRIVATE HEALTH INSURANCE | Source: Ambulatory Visit | Attending: Radiation Oncology | Admitting: Radiation Oncology

## 2011-04-16 ENCOUNTER — Encounter: Payer: Self-pay | Admitting: Radiation Oncology

## 2011-04-16 ENCOUNTER — Ambulatory Visit
Admission: RE | Admit: 2011-04-16 | Discharge: 2011-04-16 | Disposition: A | Discharge status: Home / Self Care | Payer: PRIVATE HEALTH INSURANCE | Source: Ambulatory Visit | Attending: Radiation Oncology | Admitting: Radiation Oncology

## 2011-04-16 VITALS — BP 159/86 | HR 66 | Resp 18 | Wt 148.7 lb

## 2011-04-16 DIAGNOSIS — C61 Malignant neoplasm of prostate: Secondary | ICD-10-CM

## 2011-04-16 NOTE — Progress Notes (Signed)
Patient presents to the clinic today accompanied by his daughter, Almena Lions, for under treat visit with Dr.Murray. Patient is alert and oriented to person, place, and time. No distress noted. Steady gait noted. Pleasant affect noted. Patient denies pain at this time. Patient reports pain 2 on a scale of 0-10 when he voids. Patient denies this is a burning pain and reports it subsides when he takes Motrin. Patient has no other complaints. Reported all findings to Dr. Dayton Scrape.

## 2011-04-16 NOTE — Progress Notes (Signed)
Weekly Management Note:  Site:Prostate Current Dose:  1560  cGy Projected Dose: 7800  cGy  Narrative: The patient is seen today for routine under treatment assessment. CBCT/MVCT images/port films were reviewed. The chart was reviewed. Bladder filling today is less than ideal. He has approximately 50% bladder filling. His dysuria is much improved all taking ibuprofen. His urine culture was negative. No other GU or GI difficulties.  Physical Examination:  Filed Vitals:   04/16/11 0913  BP: 159/86  Pulse: 66  Resp: 18  .  Weight: 148 lb 11.2 oz (67.45 kg). No change.  Impression: Tolerating radiation therapy well. However, I encouraged the patient and his daughter to have him improve his bladder filling.  Plan: Continue radiation therapy as planned.

## 2011-04-17 ENCOUNTER — Ambulatory Visit
Admission: RE | Admit: 2011-04-17 | Discharge: 2011-04-17 | Disposition: A | Discharge status: Home / Self Care | Payer: PRIVATE HEALTH INSURANCE | Source: Ambulatory Visit | Attending: Radiation Oncology | Admitting: Radiation Oncology

## 2011-04-18 ENCOUNTER — Ambulatory Visit
Admission: RE | Admit: 2011-04-18 | Discharge: 2011-04-18 | Disposition: A | Discharge status: Home / Self Care | Payer: PRIVATE HEALTH INSURANCE | Source: Ambulatory Visit | Attending: Radiation Oncology | Admitting: Radiation Oncology

## 2011-04-19 ENCOUNTER — Ambulatory Visit
Admission: RE | Admit: 2011-04-19 | Discharge: 2011-04-19 | Disposition: A | Discharge status: Home / Self Care | Payer: PRIVATE HEALTH INSURANCE | Source: Ambulatory Visit | Attending: Radiation Oncology | Admitting: Radiation Oncology

## 2011-04-20 ENCOUNTER — Telehealth: Payer: Self-pay | Admitting: *Deleted

## 2011-04-20 ENCOUNTER — Ambulatory Visit
Admission: RE | Admit: 2011-04-20 | Discharge: 2011-04-20 | Disposition: A | Discharge status: Home / Self Care | Payer: PRIVATE HEALTH INSURANCE | Source: Ambulatory Visit | Attending: Radiation Oncology | Admitting: Radiation Oncology

## 2011-04-20 NOTE — Telephone Encounter (Signed)
Dawn called stating pt now having dysuria and diarrhea, no fever,  checked meds, pt is on pyridium, suggested to continue to take, and to buy some imodium ad over the counter and take as directed, she  knows Dr. Dayton Scrape is off on Fridays, I will e-mail Dr. Dayton Scrape and his nurse Reinaldo Berber RN,  and, will have Samantha check with atient on Monday,Dawn gave verbal understanding and will get some imodium  4:17 PM

## 2011-04-23 ENCOUNTER — Ambulatory Visit
Admission: RE | Admit: 2011-04-23 | Discharge: 2011-04-23 | Disposition: A | Discharge status: Home / Self Care | Payer: PRIVATE HEALTH INSURANCE | Source: Ambulatory Visit | Attending: Radiation Oncology | Admitting: Radiation Oncology

## 2011-04-23 ENCOUNTER — Encounter: Payer: Self-pay | Admitting: Radiation Oncology

## 2011-04-23 VITALS — BP 159/84 | HR 63 | Resp 18 | Wt 148.5 lb

## 2011-04-23 DIAGNOSIS — C61 Malignant neoplasm of prostate: Secondary | ICD-10-CM

## 2011-04-23 NOTE — Progress Notes (Signed)
Weekly Management Note:  Site:Prostate Current Dose:  2535  cGy Projected Dose: 7800  cGy  Narrative: The patient is seen today for routine under treatment assessment. CBCT/MVCT images/port films were reviewed. The chart was reviewed.   He does report continued dysuria for he takes Motrin when necessary. He does see blood on his toilet tissue after wiping. There is no blood mixed with the stool. He does have loosening of his bowels for which he takes Imodium when necessary.  Physical Examination:  Filed Vitals:   04/23/11 0910  BP: 159/84  Pulse: 63  Resp: 18  .  Weight: 148 lb 8 oz (67.359 kg). No change.  Impression: Tolerating radiation therapy well. I wonder if he has internal hemorrhoids. There is no issue of constipation.  Plan: Continue radiation therapy as planned.

## 2011-04-23 NOTE — Progress Notes (Signed)
Patient presents to the clinic today accompanied by his daughter for an under treat visit with Dr. Dayton Scrape. Patient is alert and oriented x3. No distress noted. Steady gait noted. Pleasant affect noted. Patient reports diarrhea relieved by Imodium. Patient reports bright red blood in his stool. Patient reports burning upon urination despite taking Motrin. Reported all findings to Dr. Dayton Scrape.

## 2011-04-24 ENCOUNTER — Ambulatory Visit
Admission: RE | Admit: 2011-04-24 | Discharge: 2011-04-24 | Disposition: A | Discharge status: Home / Self Care | Payer: PRIVATE HEALTH INSURANCE | Source: Ambulatory Visit | Attending: Radiation Oncology | Admitting: Radiation Oncology

## 2011-04-25 ENCOUNTER — Ambulatory Visit
Admission: RE | Admit: 2011-04-25 | Discharge: 2011-04-25 | Disposition: A | Discharge status: Home / Self Care | Payer: PRIVATE HEALTH INSURANCE | Source: Ambulatory Visit | Attending: Radiation Oncology | Admitting: Radiation Oncology

## 2011-04-26 ENCOUNTER — Ambulatory Visit
Admission: RE | Admit: 2011-04-26 | Discharge: 2011-04-26 | Disposition: A | Discharge status: Home / Self Care | Payer: PRIVATE HEALTH INSURANCE | Source: Ambulatory Visit | Attending: Radiation Oncology | Admitting: Radiation Oncology

## 2011-04-27 ENCOUNTER — Ambulatory Visit
Admission: RE | Admit: 2011-04-27 | Discharge: 2011-04-27 | Disposition: A | Discharge status: Home / Self Care | Payer: PRIVATE HEALTH INSURANCE | Source: Ambulatory Visit | Attending: Radiation Oncology | Admitting: Radiation Oncology

## 2011-04-27 ENCOUNTER — Encounter: Payer: Self-pay | Admitting: Radiation Oncology

## 2011-04-27 VITALS — BP 174/84 | HR 62 | Resp 18 | Wt 144.4 lb

## 2011-04-27 DIAGNOSIS — C61 Malignant neoplasm of prostate: Secondary | ICD-10-CM

## 2011-04-27 MED ORDER — PHENAZOPYRIDINE HCL 200 MG PO TABS: 200.0000 mg | ORAL_TABLET | Freq: Three times a day (TID) | ORAL | Status: DC | PRN

## 2011-04-27 MED ORDER — HYDROCORTISONE ACETATE 25 MG RE SUPP: 25.0000 mg | Freq: Two times a day (BID) | RECTAL | Status: AC

## 2011-04-27 NOTE — Progress Notes (Signed)
Patient presents to the clinic today accompanied by his daughter, [name], for an under treat visit with Dr. Mitzi Hansen. Patient requesting to be seen because of unrelieved pain. Patient is alert and oriented to person, place and time. No distress noted. Steady gait noted. Pleasant affect noted. Patient reports constant low rib and hemorrhoid pain 8 on a scale of 0-10. Also, patient reports burning upon urination. Patient reports taking Motrin to relieve pain but reports it no longer helps. Patient reports that he has diarrhea each time he passes his water despite taking Imodium OTC. 5.5 lb weight loss noted since 04/05/2011. Reported all findings to Dr. Mitzi Hansen.

## 2011-04-27 NOTE — Progress Notes (Signed)
[facility] Radiation Oncology Weekly Treatment Note    Name: [name] Date: 04/27/2011 MRN: [id number] DOB: [birth year]  Status: outpatient    Current dose: 3315  Current fraction:17  Planned dose:7800  Planned fraction:40   MEDICATIONS: Current Outpatient Prescriptions  Medication Sig Dispense Refill  . allopurinol (ZYLOPRIM) 300 MG tablet Take 300 mg by mouth daily.        [name] atorvastatin (LIPITOR) 20 MG tablet Take 20 mg by mouth daily.        [name] CALCIUM-VITAMIN D PO Take 1 tablet by mouth daily.        . Cyanocobalamin (VITAMIN B-12 PO) Take 1 tablet by mouth daily.        . digoxin (LANOXIN) 0.125 MG tablet Take 125 mcg by mouth daily.        . fish oil-omega-3 fatty acids 1000 MG capsule Take 2 g by mouth daily.        . furosemide (LASIX) 20 MG tablet Take 20 mg by mouth daily.        [name] ibuprofen (ADVIL,MOTRIN) 200 MG tablet Take 200 mg by mouth every 6 (six) hours as needed.      . isosorbide mononitrate (IMDUR) 30 MG 24 hr tablet Take 30 mg by mouth daily.        [name] levofloxacin (LEVAQUIN) 500 MG tablet Take 500 mg by mouth daily. Started on 03/22/11 for 7 days      . lisinopril (PRINIVIL,ZESTRIL) 10 MG tablet Take 10 mg by mouth daily.        [name] loperamide (IMODIUM) 2 MG capsule Take 2 mg by mouth 4 (four) times daily as needed.      . metoprolol (TOPROL-XL) 50 MG 24 hr tablet Take 50 mg by mouth daily.        [name] omeprazole (PRILOSEC) 20 MG capsule Take 20 mg by mouth daily.        . potassium chloride SA (K-DUR,KLOR-CON) 20 MEQ tablet Take 20 mEq by mouth 2 (two) times daily.        . traMADol (ULTRAM) 50 MG tablet Take 50 mg by mouth every 6 (six) hours as needed. For pain.      [name] warfarin (COUMADIN) 2.5 MG tablet Take 2.5 mg by mouth daily. Take one tablet everyday except Friday. Take one-half on Friday.         ALLERGIES: Aspirin   LABORATORY DATA:  Lab Results  Component Value Date   WBC 5.9 03/24/2011   HGB 10.5* 03/24/2011   HCT 31.4*  03/24/2011   MCV 90.8 03/24/2011   PLT 139* 03/24/2011   Lab Results  Component Value Date   NA 134* 03/24/2011   K 4.2 03/24/2011   CL 97 03/24/2011   CO2 26 03/24/2011   Lab Results  Component Value Date   ALT 14 02/16/2011   AST 21 02/16/2011   ALKPHOS 62 02/16/2011   BILITOT 0.6 02/16/2011      NARRATIVE: Jarad E Schneller was seen today for weekly treatment management. The chart was checked and MVCT images were reviewed. Pt c/o dysuria, wants something for this. Also c/o hemmorrhoid pain/ rectal irritation. Some diarrhea - taking immodium x4 per day.  PHYSICAL EXAMINATION: weight is 144 lb 6.4 oz (65.499 kg). His blood pressure is 174/84 and his pulse is 62. His respiration is 18.      ASSESSMENT: Patient tolerating treatments, having some irritative symptoms.   PLAN: Continue treatment as planned. Will call in anusol  suppository and pyridium. Pt to increase immodium, and let us know if this is not sufficient.

## 2011-04-30 ENCOUNTER — Telehealth: Payer: Self-pay

## 2011-04-30 ENCOUNTER — Other Ambulatory Visit: Payer: Self-pay | Admitting: *Deleted

## 2011-04-30 ENCOUNTER — Ambulatory Visit
Admission: RE | Admit: 2011-04-30 | Discharge: 2011-04-30 | Disposition: A | Discharge status: Home / Self Care | Payer: PRIVATE HEALTH INSURANCE | Source: Ambulatory Visit | Attending: Radiation Oncology | Admitting: Radiation Oncology

## 2011-04-30 ENCOUNTER — Encounter: Payer: Self-pay | Admitting: *Deleted

## 2011-04-30 NOTE — Telephone Encounter (Signed)
Patient states medications did not go thru to [facility] pharmacy Friday 04/27/2011.  Called in pyridium and anusol. Will call back to confirm receipt.Family aware and will  Pick up this afternoon.

## 2011-04-30 NOTE — Telephone Encounter (Signed)
Pt daughter and patient came by stating they had went to [facility] pharmacy on Friday and Saturday for rx's that were to be called in by Dr. Mitzi Hansen, ,and [facility] didn't have on file, will look in to this and call her when rx's are ready, apologized for the mix up, daughter going to school and will pick up rx's this afternoon when she gets out of school 9:39 AM

## 2011-04-30 NOTE — Telephone Encounter (Signed)
Confirmed receipt of prescriptions (anusol and pyridium) at [facility].

## 2011-05-01 ENCOUNTER — Ambulatory Visit
Admission: RE | Admit: 2011-05-01 | Discharge: 2011-05-01 | Disposition: A | Discharge status: Home / Self Care | Payer: PRIVATE HEALTH INSURANCE | Source: Ambulatory Visit | Attending: Radiation Oncology | Admitting: Radiation Oncology

## 2011-05-01 ENCOUNTER — Encounter: Payer: Self-pay | Admitting: Radiation Oncology

## 2011-05-01 VITALS — Wt 144.6 lb

## 2011-05-01 DIAGNOSIS — C61 Malignant neoplasm of prostate: Secondary | ICD-10-CM

## 2011-05-01 NOTE — Progress Notes (Signed)
Pt states pain w/urination much improved, started Pyridium yesterday. Nocturia x 2-3 which he states he has hx of. Pt drinks 4-5 bottles water daily. Denies problems w/bowels.

## 2011-05-01 NOTE — Progress Notes (Signed)
Weekly Management Note:  Site:Prostate/LNs Current Dose:  3705  cGy Projected Dose: 7800  cGy  Narrative: The patient is seen today for routine under treatment assessment. CBCT/MVCT images/port films were reviewed. The chart was reviewed.   He has had good bladder filling. His dysuria is improved after starting Pyridium over the weekend. No GI difficulties.  Physical Examination: There were no vitals filed for this visit..  Weight: 144 lb 9.6 oz (65.59 kg). No change.  Impression: Tolerating radiation therapy well.  Plan: Continue radiation therapy as planned.

## 2011-05-02 ENCOUNTER — Ambulatory Visit
Admission: RE | Admit: 2011-05-02 | Discharge: 2011-05-02 | Disposition: A | Discharge status: Home / Self Care | Payer: PRIVATE HEALTH INSURANCE | Source: Ambulatory Visit | Attending: Radiation Oncology | Admitting: Radiation Oncology

## 2011-05-02 ENCOUNTER — Telehealth: Payer: Self-pay | Admitting: Radiation Oncology

## 2011-05-02 NOTE — Telephone Encounter (Signed)
Received call from [facility], patient's son, concerned that her father continues to burn upon urination and defecation despite taking Pyridium and Motrin. Informed Dr. Dayton Scrape of this conversation. Dr. Dayton Scrape wants to obtain a urine sample for UA and C/S tomorrow following treatment tomorrow.

## 2011-05-03 ENCOUNTER — Ambulatory Visit
Admission: RE | Admit: 2011-05-03 | Discharge: 2011-05-03 | Disposition: A | Discharge status: Home / Self Care | Payer: PRIVATE HEALTH INSURANCE | Source: Ambulatory Visit | Attending: Radiation Oncology | Admitting: Radiation Oncology

## 2011-05-03 ENCOUNTER — Encounter: Payer: Self-pay | Admitting: Radiation Oncology

## 2011-05-03 DIAGNOSIS — N390 Urinary tract infection, site not specified: Secondary | ICD-10-CM | POA: Insufficient documentation

## 2011-05-03 LAB — URINALYSIS, MICROSCOPIC - CHCC: Specific Gravity, Urine: 1.015 (ref 1.003–1.035)

## 2011-05-03 NOTE — Progress Notes (Signed)
Weekly Management Note:  Site:Prostate Current Dose:  4095  cGy Projected Dose: 7800  cGy  Narrative: The patient is seen today for routine under treatment assessment. CBCT/MVCT images/port films were reviewed. The chart was reviewed.   He seen today with worsening dysuria. 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  Physical Examination:  Filed Vitals:   05/03/11 0905  BP: 141/84  Pulse: 65  Resp: 18  .  Weight: 143 lb 3.2 oz (64.955 kg). No change  Impression: Tolerating radiation therapy well, however, I am concerned about his worsening dysuria. I'll go ahead and get a urinalysis and culture today.  Plan: Continue radiation therapy as planned.

## 2011-05-03 NOTE — Progress Notes (Signed)
Patient presents to the clinic today unaccompanied requesting to be seen by Dr. Dayton Scrape. Patient is alert and oriented to person, place, and time. No distress noted. Steady gait noted. Pleasant affect noted. Patient reports burning upon urination and defecation despite pyridium and motrin. Patient reports his urine is orange related to the effects of pyridium. Patient reports that he got up four times during the night to void.

## 2011-05-03 NOTE — Progress Notes (Signed)
Encounter addended by: Maryln Gottron, MD on: 05/03/2011  9:20 AM<BR>     Documentation filed: Notes Section

## 2011-05-04 ENCOUNTER — Ambulatory Visit
Admission: RE | Admit: 2011-05-04 | Discharge: 2011-05-04 | Disposition: A | Discharge status: Home / Self Care | Payer: PRIVATE HEALTH INSURANCE | Source: Ambulatory Visit | Attending: Radiation Oncology | Admitting: Radiation Oncology

## 2011-05-07 ENCOUNTER — Ambulatory Visit
Admission: RE | Admit: 2011-05-07 | Discharge: 2011-05-07 | Disposition: A | Discharge status: Home / Self Care | Payer: PRIVATE HEALTH INSURANCE | Source: Ambulatory Visit | Attending: Radiation Oncology | Admitting: Radiation Oncology

## 2011-05-07 ENCOUNTER — Encounter: Payer: Self-pay | Admitting: Radiation Oncology

## 2011-05-07 ENCOUNTER — Telehealth: Payer: Self-pay | Admitting: Radiation Oncology

## 2011-05-07 VITALS — BP 121/73 | HR 61 | Resp 18 | Wt 143.7 lb

## 2011-05-07 DIAGNOSIS — C61 Malignant neoplasm of prostate: Secondary | ICD-10-CM

## 2011-05-07 NOTE — Progress Notes (Signed)
Weekly Management Note:  Site:Prostate  Current Dose:  4485  cGy Projected Dose: 7800  cGy  Narrative: The patient is seen today for routine under treatment assessment. CBCT/MVCT images/port films were reviewed. The chart was reviewed.   Bladder filling is satisfactory. He continues to take Pyridium when necessary. His urinalysis from last week was without evidence for infection. Culture showed no growth. I believe that he was regarding prescription by Dr. Mitzi Hansen for [facility] suppositories last week by Dr. Mitzi Hansen. The patient was unable to fill his Anusol-HC because of cost. He is doing well today from a GU and GI standpoint. He's not having any significant rectal discomfort.  Physical Examination:  Filed Vitals:   05/07/11 0941  BP: 121/73  Pulse: 61  Resp: 18  .  Weight: 143 lb 11.2 oz (65.182 kg). No change.  Impression: Tolerating radiation therapy well.  Plan: Continue radiation therapy as planned.

## 2011-05-07 NOTE — Progress Notes (Signed)
Patient presents to the clinic today accompanied by his daughter for an under treat visit with Dr. Dayton Scrape. Patient is alert and oriented to person, place, and time. No distress noted. Steady gait noted. Pleasant affect noted. Patient denies pain at this time. Patient reports the burning he feels when he urinate is getting better with Motrin and Pyridium. Patient reports getting up four times per night on average to void. Patient denies blood in his urine. Patient denies diarrhea.  Reported all findings to Dr. Dayton Scrape.

## 2011-05-07 NOTE — Telephone Encounter (Signed)
05/07/2011 1010 Colen Darling, RN phoned in Pyridium and Anusol to [facility] since neither went thru as an Psychologist, [facility] probation services. Informed patient and Dr. Dayton Scrape of this occurrence. Patient reports understanding and that he has yet to run out therefore "there has been no lapse in taking his pills."

## 2011-05-08 ENCOUNTER — Ambulatory Visit
Admission: RE | Admit: 2011-05-08 | Discharge: 2011-05-08 | Disposition: A | Discharge status: Home / Self Care | Payer: PRIVATE HEALTH INSURANCE | Source: Ambulatory Visit | Attending: Radiation Oncology | Admitting: Radiation Oncology

## 2011-05-09 ENCOUNTER — Ambulatory Visit
Admission: RE | Admit: 2011-05-09 | Discharge: 2011-05-09 | Disposition: A | Discharge status: Home / Self Care | Payer: PRIVATE HEALTH INSURANCE | Source: Ambulatory Visit | Attending: Radiation Oncology | Admitting: Radiation Oncology

## 2011-05-10 ENCOUNTER — Ambulatory Visit
Admission: RE | Admit: 2011-05-10 | Discharge: 2011-05-10 | Disposition: A | Discharge status: Home / Self Care | Payer: PRIVATE HEALTH INSURANCE | Source: Ambulatory Visit | Attending: Radiation Oncology | Admitting: Radiation Oncology

## 2011-05-11 ENCOUNTER — Ambulatory Visit
Admission: RE | Admit: 2011-05-11 | Discharge: 2011-05-11 | Disposition: A | Discharge status: Home / Self Care | Payer: PRIVATE HEALTH INSURANCE | Source: Ambulatory Visit | Attending: Radiation Oncology | Admitting: Radiation Oncology

## 2011-05-14 ENCOUNTER — Encounter (HOSPITAL_COMMUNITY): Payer: Self-pay

## 2011-05-14 ENCOUNTER — Emergency Department (HOSPITAL_COMMUNITY): Payer: PRIVATE HEALTH INSURANCE

## 2011-05-14 ENCOUNTER — Other Ambulatory Visit: Payer: Self-pay

## 2011-05-14 ENCOUNTER — Observation Stay (HOSPITAL_COMMUNITY)
Admission: EM | Admit: 2011-05-14 | Discharge: 2011-05-21 | Disposition: A | Discharge status: Home / Self Care | Payer: PRIVATE HEALTH INSURANCE | Attending: Internal Medicine | Admitting: Internal Medicine

## 2011-05-14 ENCOUNTER — Encounter: Payer: Self-pay | Admitting: Radiation Oncology

## 2011-05-14 ENCOUNTER — Ambulatory Visit
Admission: RE | Admit: 2011-05-14 | Discharge: 2011-05-14 | Disposition: A | Discharge status: Home / Self Care | Payer: PRIVATE HEALTH INSURANCE | Source: Ambulatory Visit | Attending: Radiation Oncology | Admitting: Radiation Oncology

## 2011-05-14 VITALS — BP 117/65 | HR 59 | Resp 18 | Wt 142.9 lb

## 2011-05-14 DIAGNOSIS — I459 Conduction disorder, unspecified: Secondary | ICD-10-CM

## 2011-05-14 DIAGNOSIS — M25519 Pain in unspecified shoulder: Secondary | ICD-10-CM | POA: Insufficient documentation

## 2011-05-14 DIAGNOSIS — Z9981 Dependence on supplemental oxygen: Secondary | ICD-10-CM | POA: Insufficient documentation

## 2011-05-14 DIAGNOSIS — E119 Type 2 diabetes mellitus without complications: Secondary | ICD-10-CM | POA: Insufficient documentation

## 2011-05-14 DIAGNOSIS — I2510 Atherosclerotic heart disease of native coronary artery without angina pectoris: Secondary | ICD-10-CM | POA: Insufficient documentation

## 2011-05-14 DIAGNOSIS — N390 Urinary tract infection, site not specified: Secondary | ICD-10-CM

## 2011-05-14 DIAGNOSIS — M6281 Muscle weakness (generalized): Principal | ICD-10-CM | POA: Insufficient documentation

## 2011-05-14 DIAGNOSIS — I509 Heart failure, unspecified: Secondary | ICD-10-CM | POA: Insufficient documentation

## 2011-05-14 DIAGNOSIS — I252 Old myocardial infarction: Secondary | ICD-10-CM | POA: Insufficient documentation

## 2011-05-14 DIAGNOSIS — Z7901 Long term (current) use of anticoagulants: Secondary | ICD-10-CM | POA: Insufficient documentation

## 2011-05-14 DIAGNOSIS — K921 Melena: Secondary | ICD-10-CM

## 2011-05-14 DIAGNOSIS — R531 Weakness: Secondary | ICD-10-CM | POA: Diagnosis present

## 2011-05-14 DIAGNOSIS — D649 Anemia, unspecified: Secondary | ICD-10-CM

## 2011-05-14 DIAGNOSIS — N179 Acute kidney failure, unspecified: Secondary | ICD-10-CM

## 2011-05-14 DIAGNOSIS — Z95810 Presence of automatic (implantable) cardiac defibrillator: Secondary | ICD-10-CM | POA: Insufficient documentation

## 2011-05-14 DIAGNOSIS — I5022 Chronic systolic (congestive) heart failure: Secondary | ICD-10-CM | POA: Insufficient documentation

## 2011-05-14 DIAGNOSIS — C61 Malignant neoplasm of prostate: Secondary | ICD-10-CM | POA: Insufficient documentation

## 2011-05-14 DIAGNOSIS — Z950 Presence of cardiac pacemaker: Secondary | ICD-10-CM | POA: Diagnosis present

## 2011-05-14 DIAGNOSIS — I482 Chronic atrial fibrillation, unspecified: Secondary | ICD-10-CM | POA: Diagnosis present

## 2011-05-14 DIAGNOSIS — K56609 Unspecified intestinal obstruction, unspecified as to partial versus complete obstruction: Secondary | ICD-10-CM

## 2011-05-14 DIAGNOSIS — R079 Chest pain, unspecified: Secondary | ICD-10-CM

## 2011-05-14 DIAGNOSIS — R55 Syncope and collapse: Secondary | ICD-10-CM

## 2011-05-14 DIAGNOSIS — I4891 Unspecified atrial fibrillation: Secondary | ICD-10-CM | POA: Insufficient documentation

## 2011-05-14 DIAGNOSIS — Z8673 Personal history of transient ischemic attack (TIA), and cerebral infarction without residual deficits: Secondary | ICD-10-CM | POA: Insufficient documentation

## 2011-05-14 DIAGNOSIS — Z951 Presence of aortocoronary bypass graft: Secondary | ICD-10-CM

## 2011-05-14 HISTORY — DX: Unspecified osteoarthritis, unspecified site: M19.90

## 2011-05-14 HISTORY — DX: Chronic obstructive pulmonary disease, unspecified: J44.9

## 2011-05-14 HISTORY — DX: Gastro-esophageal reflux disease without esophagitis: K21.9

## 2011-05-14 LAB — BASIC METABOLIC PANEL
BUN: 29 mg/dL — ABNORMAL HIGH (ref 6–23)
CO2: 21 mEq/L (ref 19–32)
Chloride: 104 mEq/L (ref 96–112)
Creatinine, Ser: 1.29 mg/dL (ref 0.50–1.35)

## 2011-05-14 LAB — CBC
HCT: 25.6 % — ABNORMAL LOW (ref 39.0–52.0)
MCV: 90.1 fL (ref 78.0–100.0)
RBC: 2.84 MIL/uL — ABNORMAL LOW (ref 4.22–5.81)
RDW: 18.8 % — ABNORMAL HIGH (ref 11.5–15.5)
WBC: 4.6 10*3/uL (ref 4.0–10.5)

## 2011-05-14 LAB — PROTIME-INR: Prothrombin Time: 24.7 seconds — ABNORMAL HIGH (ref 11.6–15.2)

## 2011-05-14 LAB — MRSA PCR SCREENING: MRSA by PCR: NEGATIVE

## 2011-05-14 LAB — APTT: aPTT: 36 seconds (ref 24–37)

## 2011-05-14 LAB — OCCULT BLOOD, POC DEVICE: Fecal Occult Bld: POSITIVE

## 2011-05-14 LAB — POCT I-STAT TROPONIN I

## 2011-05-14 MED ORDER — ALLOPURINOL 300 MG PO TABS
300.0000 mg | ORAL_TABLET | Freq: Every day | ORAL | Status: DC
  Administered 2011-05-15 – 2011-05-21 (×7): 300 mg via ORAL
  Filled 2011-05-14 (×7): qty 1

## 2011-05-14 MED ORDER — SIMVASTATIN 40 MG PO TABS
40.0000 mg | ORAL_TABLET | Freq: Every day | ORAL | Status: DC
  Administered 2011-05-15 – 2011-05-20 (×6): 40 mg via ORAL
  Filled 2011-05-14 (×7): qty 1

## 2011-05-14 MED ORDER — ACETAMINOPHEN 325 MG PO TABS
650.0000 mg | ORAL_TABLET | Freq: Four times a day (QID) | ORAL | Status: DC | PRN
  Administered 2011-05-19 – 2011-05-20 (×2): 650 mg via ORAL
  Filled 2011-05-14 (×2): qty 2

## 2011-05-14 MED ORDER — ALUM & MAG HYDROXIDE-SIMETH 200-200-20 MG/5ML PO SUSP: 30.0000 mL | Freq: Four times a day (QID) | ORAL | Status: DC | PRN

## 2011-05-14 MED ORDER — INSULIN ASPART 100 UNIT/ML ~~LOC~~ SOLN
0.0000 [IU] | Freq: Three times a day (TID) | SUBCUTANEOUS | Status: DC
  Administered 2011-05-20: 1 [IU] via SUBCUTANEOUS
  Filled 2011-05-14: qty 3

## 2011-05-14 MED ORDER — NITROGLYCERIN 2 % TD OINT: 0.5000 [in_us] | TOPICAL_OINTMENT | Freq: Once | TRANSDERMAL | Status: DC

## 2011-05-14 MED ORDER — SODIUM CHLORIDE 0.9 % IV SOLN
INTRAVENOUS | Status: AC
  Administered 2011-05-14: via INTRAVENOUS

## 2011-05-14 MED ORDER — LISINOPRIL 10 MG PO TABS
10.0000 mg | ORAL_TABLET | Freq: Every day | ORAL | Status: DC
  Administered 2011-05-15 – 2011-05-21 (×7): 10 mg via ORAL
  Filled 2011-05-14 (×7): qty 1

## 2011-05-14 MED ORDER — TRAMADOL HCL 50 MG PO TABS
50.0000 mg | ORAL_TABLET | Freq: Four times a day (QID) | ORAL | Status: DC | PRN
  Administered 2011-05-15 – 2011-05-16 (×2): 50 mg via ORAL
  Filled 2011-05-14 (×2): qty 1

## 2011-05-14 MED ORDER — DIPHENHYDRAMINE HCL 25 MG PO CAPS
25.0000 mg | ORAL_CAPSULE | Freq: Once | ORAL | Status: AC
  Administered 2011-05-14: 25 mg via ORAL
  Filled 2011-05-14: qty 1

## 2011-05-14 MED ORDER — VITAMIN B-12 100 MCG PO TABS
100.0000 ug | ORAL_TABLET | Freq: Two times a day (BID) | ORAL | Status: DC
  Administered 2011-05-14 – 2011-05-21 (×14): 100 ug via ORAL
  Filled 2011-05-14 (×15): qty 1

## 2011-05-14 MED ORDER — ONDANSETRON HCL 4 MG/2ML IJ SOLN
4.0000 mg | Freq: Four times a day (QID) | INTRAMUSCULAR | Status: DC | PRN
  Administered 2011-05-17: 4 mg via INTRAVENOUS
  Filled 2011-05-14: qty 2

## 2011-05-14 MED ORDER — SODIUM CHLORIDE 0.9 % IJ SOLN
3.0000 mL | Freq: Two times a day (BID) | INTRAMUSCULAR | Status: DC
Start: 2011-05-14 — End: 2011-05-21
  Administered 2011-05-14 – 2011-05-21 (×12): 3 mL via INTRAVENOUS

## 2011-05-14 MED ORDER — ASPIRIN 81 MG PO CHEW
324.0000 mg | CHEWABLE_TABLET | Freq: Once | ORAL | Status: AC
  Administered 2011-05-14: 324 mg via ORAL
  Filled 2011-05-14: qty 4

## 2011-05-14 MED ORDER — DIGOXIN 125 MCG PO TABS
125.0000 ug | ORAL_TABLET | Freq: Every day | ORAL | Status: DC
  Administered 2011-05-15 – 2011-05-21 (×7): 125 ug via ORAL
  Filled 2011-05-14 (×7): qty 1

## 2011-05-14 MED ORDER — POTASSIUM CHLORIDE CRYS ER 20 MEQ PO TBCR
20.0000 meq | EXTENDED_RELEASE_TABLET | Freq: Two times a day (BID) | ORAL | Status: DC
  Administered 2011-05-14 – 2011-05-21 (×14): 20 meq via ORAL
  Filled 2011-05-14 (×15): qty 1

## 2011-05-14 MED ORDER — LOPERAMIDE HCL 2 MG PO CAPS
2.0000 mg | ORAL_CAPSULE | Freq: Four times a day (QID) | ORAL | Status: DC | PRN
Start: 2011-05-14 — End: 2011-05-21
  Administered 2011-05-16 – 2011-05-17 (×3): 2 mg via ORAL
  Filled 2011-05-14 (×3): qty 1

## 2011-05-14 MED ORDER — WARFARIN 1.25 MG HALF TABLET
1.2500 mg | ORAL_TABLET | Freq: Once | ORAL | Status: AC
  Administered 2011-05-15: 1.25 mg via ORAL
  Filled 2011-05-14 (×2): qty 1

## 2011-05-14 MED ORDER — WARFARIN - PHARMACIST DOSING INPATIENT
Freq: Every day | Status: DC
  Filled 2011-05-14: qty 1

## 2011-05-14 MED ORDER — ACETAMINOPHEN 650 MG RE SUPP: 650.0000 mg | Freq: Four times a day (QID) | RECTAL | Status: DC | PRN

## 2011-05-14 MED ORDER — OMEGA-3 FATTY ACIDS 1000 MG PO CAPS: 1.0000 g | ORAL_CAPSULE | Freq: Two times a day (BID) | ORAL | Status: DC

## 2011-05-14 MED ORDER — SENNOSIDES-DOCUSATE SODIUM 8.6-50 MG PO TABS
1.0000 | ORAL_TABLET | Freq: Every evening | ORAL | Status: DC | PRN
  Filled 2011-05-14: qty 1

## 2011-05-14 MED ORDER — FUROSEMIDE 20 MG PO TABS
20.0000 mg | ORAL_TABLET | Freq: Every day | ORAL | Status: DC
  Administered 2011-05-15 – 2011-05-17 (×3): 20 mg via ORAL
  Filled 2011-05-14 (×3): qty 1

## 2011-05-14 MED ORDER — ISOSORBIDE MONONITRATE ER 30 MG PO TB24
30.0000 mg | ORAL_TABLET | Freq: Every day | ORAL | Status: DC
  Administered 2011-05-15 – 2011-05-21 (×7): 30 mg via ORAL
  Filled 2011-05-14 (×7): qty 1

## 2011-05-14 MED ORDER — METOPROLOL SUCCINATE ER 25 MG PO TB24
25.0000 mg | ORAL_TABLET | Freq: Every day | ORAL | Status: DC
  Administered 2011-05-15 – 2011-05-21 (×7): 25 mg via ORAL
  Filled 2011-05-14 (×7): qty 1

## 2011-05-14 MED ORDER — PHENAZOPYRIDINE HCL 200 MG PO TABS
200.0000 mg | ORAL_TABLET | Freq: Three times a day (TID) | ORAL | Status: DC
  Administered 2011-05-15 – 2011-05-21 (×20): 200 mg via ORAL
  Filled 2011-05-14 (×22): qty 1

## 2011-05-14 MED ORDER — OMEGA-3-ACID ETHYL ESTERS 1 G PO CAPS
1.0000 g | ORAL_CAPSULE | Freq: Two times a day (BID) | ORAL | Status: DC
  Administered 2011-05-15 – 2011-05-21 (×14): 1 g via ORAL
  Filled 2011-05-14 (×15): qty 1

## 2011-05-14 MED ORDER — MORPHINE SULFATE 2 MG/ML IJ SOLN: 2.0000 mg | INTRAMUSCULAR | Status: DC | PRN

## 2011-05-14 MED ORDER — ONDANSETRON HCL 4 MG PO TABS: 4.0000 mg | ORAL_TABLET | Freq: Four times a day (QID) | ORAL | Status: DC | PRN

## 2011-05-14 MED ORDER — WARFARIN 1.25 MG HALF TABLET: 1.2500 mg | ORAL_TABLET | Freq: Every evening | ORAL | Status: DC

## 2011-05-14 MED ORDER — OXYCODONE HCL 5 MG PO TABS: 5.0000 mg | ORAL_TABLET | ORAL | Status: DC | PRN

## 2011-05-14 MED ORDER — PANTOPRAZOLE SODIUM 40 MG PO TBEC
40.0000 mg | DELAYED_RELEASE_TABLET | Freq: Every day | ORAL | Status: DC
  Administered 2011-05-15 – 2011-05-21 (×8): 40 mg via ORAL
  Filled 2011-05-14 (×8): qty 1

## 2011-05-14 NOTE — ED Provider Notes (Signed)
78 year old male has a history coronary artery disease, congestive heart failure, COPD, home oxygen at 2 L, his primary care doctor is Dr. Pearson Grippe, his cardiologist is Dr. Sharrell Ku, patient has an implantable cardiac defibrillator and pacemaker as a paced rhythm the ED, he has had anemia in the past with a hemoglobin of 8 in December 2012, he now presents with a few days of gradually worsening general fatigue and generalized weakness according to his daughter, the patient states he feels more fatigued than usual the last couple of days as well as having several intermittent vague chest discomfort lasting anywhere from several minutes to half an hour time but not present now in the ED. He denies any more shortness breath than his usual baseline shortness of breath with minimal activity. He denies fever or cough. His chest discomfort is very difficult to discern and is vague it is not sharp stabbing sudden or pleuritic  I saw and evaluated the patient, reviewed the resident's note and I agree with the findings and plan including ECG.  Hurman Horn, MD 05/15/11 2157

## 2011-05-14 NOTE — ED Notes (Signed)
Patient presents with substernal chest tightness with shortness of breath intermittently since this AM. Patient reports he was seen this AM by his oncologist but did not mention his chest pain, patient reporting pain 6/10.

## 2011-05-14 NOTE — Progress Notes (Signed)
Weekly Management Note:  Site:Prostate Current Dose:  5460  cGy Projected Dose: 7800  cGy  Narrative: The patient is seen today for routine under treatment assessment. CBCT/MVCT images/port films were reviewed. The chart was reviewed.   Excellent bladder filling today. He does report nocturia x3-4, but is otherwise not having any significant GU or GI difficulties.  Physical Examination:  Filed Vitals:   05/14/11 0939  BP: 117/65  Pulse: 59  Resp: 18  .  Weight: 142 lb 14.4 oz (64.819 kg). No change.  Impression: Tolerating radiation therapy well.  Plan: Continue radiation therapy as planned.

## 2011-05-14 NOTE — Progress Notes (Signed)
ANTICOAGULATION CONSULT NOTE - Initial Consult  Pharmacy Consult for coumadin Indication: atrial fibrillation  Allergies  Allergen Reactions  . Aspirin Hives    Patient Measurements:   Vital Signs: Temp: 97.7 F (36.5 C) (03/04 2053) Temp src: Oral (03/04 2053) BP: 136/58 mmHg (03/04 2053) Pulse Rate: 44  (03/04 1901)  Labs:  [facility] 05/14/11 2008 05/14/11 1712  HGB -- 8.6*  HCT -- 25.6*  PLT -- 146*  APTT -- --  LABPROT 25.2* --  INR 2.24* --  HEPARINUNFRC -- --  CREATININE -- 1.29  CKTOTAL -- --  CKMB -- --  TROPONINI -- --   The CrCl is unknown because both a height and weight (above a minimum accepted value) are required for this calculation.  Medical History: Past Medical History  Diagnosis Date  . Diabetes mellitus   . Hypertension   . CHF (congestive heart failure)   . CVA (cerebral vascular accident)   . MI (myocardial infarction)   . Pacemaker   . S/P CABG (coronary artery bypass graft)   . Gout   . Nephrolithiasis   . Prostate cancer, recur risk not determined whether low, med or high   . Coronary artery disease   . Hypercholesterolemia   . Black lung disease   . Prostate cancer 11/01/10    gleason 7, 8, 9, gold seeds 02/08/11  . On home oxygen therapy     uses prn, most often in hot weather    Medications:  Scheduled:    . allopurinol  300 mg Oral Daily  . aspirin  324 mg Oral Once  . digoxin  125 mcg Oral Daily  . diphenhydrAMINE  25 mg Oral Once  . fish oil-omega-3 fatty acids  1 g Oral BID  . furosemide  20 mg Oral BH-q7a  . insulin aspart  0-9 Units Subcutaneous TID WC  . isosorbide mononitrate  30 mg Oral Daily  . lisinopril  10 mg Oral BH-q7a  . metoprolol succinate  25 mg Oral BH-q7a  . pantoprazole  40 mg Oral Q0600  . phenazopyridine  200 mg Oral TID WC  . potassium chloride SA  20 mEq Oral BID  . simvastatin  40 mg Oral Daily  . sodium chloride  3 mL Intravenous Q12H  . vitamin B-12  100 mcg Oral BID  . warfarin   1.25-2.5 mg Oral QPM  . DISCONTD: nitroGLYCERIN  0.5 inch Topical Once    Assessment: 78 yr old male on coumadin for afib.  INR is at goal, so will continue with home dose. (Home dose of coumadin is 2.5mg  daily except 1.25mg  on Mondays and Fridays)  Goal of Therapy:  INR 2-3   Plan:  Coumadin 1.25mg  x 1 dose tonight. Daily PT/INR.  Wendie Simmer, PharmD, BCPS Clinical Pharmacist  Pager: [phone] 05/14/2011,9:47 PM

## 2011-05-14 NOTE — H&P (Signed)
[name] MRN: [id number] DOB/AGE: [birth year] 76 y.o. Primary Care Physician:KIM, JAMES, MD, MD Admit date: 05/14/2011 Chief Complaint: Weakness, fatigue/chest pain HPI:  [name] is a 78 year old Caucasian gentleman with history of diabetes, hypertension, CHF, CVA, MI, status post permanent pacemaker, status post bypass surgery, history of prostate cancer status post gold seeds and receiving radiation therapy, hyperlipidemia nephrolithiasis, on chronic Coumadin therapy who presents to the ED with a one-day history of weakness and fatigue and chest pain. Patient states that he's been feeling weak and fatigued x1 day and his daughter felt he was pale. Patient also complained of left shoulder pain has ongoing for months. Patient states that on the day of admission had some left substernal chest pain which was nonradiating in nature occurred at rest and lasted approximately 6 minutes. Patient denies any shortness of breath, no paroxysmal nocturnal dyspnea, no orthopnea. Patient does endorse palpitations which he states is chronic. Patient denies any fevers, no chills, no cough, no nausea, no vomiting, no lower extremity edema, no abdominal pain, no diarrhea, no constipation. Patient does complain of burning on urination. Patient denies any acute blood noted in his stools. Patient was seen in the ED CBC which was obtained showed a hemoglobin of 8.6. Patient did have a hemoglobin of 8 in December 2012. Be met obtained did show BUN of 29 otherwise was within normal limits. Troponin I received was 0.02. Platelet count was 146. PT/INR was 25.2 and 2.24. Will call to admit the patient for further evaluation and management.  Past Medical History  Diagnosis Date  . Diabetes mellitus   . Hypertension   . CHF (congestive heart failure)   . CVA (cerebral vascular accident)   . MI (myocardial infarction)   . Pacemaker   . S/P CABG (coronary artery bypass graft)   . Gout   . Nephrolithiasis   . Prostate  cancer, recur risk not determined whether low, med or high   . Coronary artery disease   . Hypercholesterolemia   . Black lung disease   . Prostate cancer 11/01/10    gleason 7, 8, 9, gold seeds 02/08/11  . On home oxygen therapy     uses prn, most often in hot weather    Past Surgical History  Procedure Date  . Colonoscopy   . Icd insertion     status post BIV  . Coronary artery bypass graft   . Esophagogastroduodenoscopy 02/19/2011    Procedure: ESOPHAGOGASTRODUODENOSCOPY (EGD);  Surgeon: Petra Kuba, MD;  Location: [facility] ENDOSCOPY;  Service: Endoscopy;  Laterality: N/A;  . Upper endoscpopy     Prior to Admission medications   Medication Sig Start Date End Date Taking? Authorizing Provider  allopurinol (ZYLOPRIM) 300 MG tablet Take 300 mg by mouth daily.     Yes Historical Provider, MD  atorvastatin (LIPITOR) 20 MG tablet Take 20 mg by mouth daily.     Yes Historical Provider, MD  CALCIUM-VITAMIN D PO Take 1 tablet by mouth 2 (two) times daily.    Yes Historical Provider, MD  Cyanocobalamin (VITAMIN B-12 PO) Take 1 tablet by mouth 2 (two) times daily.    Yes Historical Provider, MD  digoxin (LANOXIN) 0.125 MG tablet Take 125 mcg by mouth daily.     Yes Historical Provider, MD  fish oil-omega-3 fatty acids 1000 MG capsule Take 1 g by mouth 2 (two) times daily.    Yes Historical Provider, MD  furosemide (LASIX) 20 MG tablet Take 20 mg by mouth every morning.  02/11/11  Yes Brendia Sacks, MD  ibuprofen (ADVIL,MOTRIN) 200 MG tablet Take 200 mg by mouth at bedtime.    Yes Historical Provider, MD  isosorbide mononitrate (IMDUR) 30 MG 24 hr tablet Take 30 mg by mouth daily.     Yes Historical Provider, MD  lisinopril (PRINIVIL,ZESTRIL) 10 MG tablet Take 10 mg by mouth every morning.    Yes Historical Provider, MD  loperamide (IMODIUM) 2 MG capsule Take 2 mg by mouth 4 (four) times daily as needed. For diarrhea   Yes Historical Provider, MD  metoprolol succinate (TOPROL-XL) 25 MG 24 hr  tablet Take 25 mg by mouth every morning.   Yes Historical Provider, MD  omeprazole (PRILOSEC) 20 MG capsule Take 20 mg by mouth every morning.    Yes Historical Provider, MD  phenazopyridine (PYRIDIUM) 200 MG tablet Take 200 mg by mouth 3 (three) times daily as needed. For burning associated with UTI 04/27/11  Yes Jonna Coup, MD  potassium chloride SA (K-DUR,KLOR-CON) 20 MEQ tablet Take 20 mEq by mouth 2 (two) times daily.     Yes Historical Provider, MD  traMADol (ULTRAM) 50 MG tablet Take 50 mg by mouth every 6 (six) hours as needed. For pain.   Yes Historical Provider, MD  warfarin (COUMADIN) 2.5 MG tablet Take 1.25-2.5 mg by mouth every evening. Take 1/2 tablet on Mon & Fri & 1 tablet on all other days   Yes Historical Provider, MD    Allergies:  Allergies  Allergen Reactions  . Aspirin Hives    Family History  Problem Relation Age of Onset  . Alzheimer's disease Father 35  . Cancer Father 55    metastatic prostate cancer  . Diabetes Sister 40  . Diabetes Brother   . Diabetes Brother   . Diabetes Brother   . Hypotension Neg Hx   . Malignant hyperthermia Neg Hx   . Pseudochol deficiency Neg Hx     Social History:  reports that he has never smoked. His smokeless tobacco use includes Chew. He reports that he does not drink alcohol or use illicit drugs.  ROS: All systems reviewed with the patient and was positive as per HPI otherwise all other systems are negative.  PHYSICAL EXAM: Blood pressure 136/58, pulse 44, temperature 97.7 F (36.5 C), temperature source Oral, resp. rate 18, SpO2 98.00%. General: Pale-looking. Well-developed well-nourished in no acute cardiopulmonary distress.  HEENT: Normocephalic atraumatic. Pupils equal round and reactive to light and accommodation. XL, was intact. Oropharynx is clear, no lesions, no exudates. Neck is supple no lymphadenopathy. No bruits, no goiter. Heart: Regular rate and rhythm, without murmurs, rubs, gallops. Lungs: Clear to  auscultation bilaterally. Abdomen: Soft, nontender, nondistended, positive bowel sounds. Extremities: No clubbing cyanosis or edema with positive pedal pulses. Neuro: Alert and on to x3. PMS 2 through 12 are grossly intact. No focal deficits. Gait not tested secondary to safety.     EKG: Paced rhythm.  No results found for this or any previous visit (from the past 240 hour(s)).   Lab results:  [facility] 05/14/11 1712  NA 135  K 4.4  CL 104  CO2 21  GLUCOSE 119*  BUN 29*  CREATININE 1.29  CALCIUM 9.8  MG --  PHOS --   No results found for this basename: AST:2,ALT:2,ALKPHOS:2,BILITOT:2,PROT:2,ALBUMIN:2 in the last 72 hours No results found for this basename: LIPASE:2,AMYLASE:2 in the last 72 hours  Basename 05/14/11 1712  WBC 4.6  NEUTROABS --  HGB 8.6*  HCT 25.6*  MCV  90.1  PLT 146*   No results found for this basename: CKTOTAL:3,CKMB:3,CKMBINDEX:3,TROPONINI:3 in the last 72 hours No components found with this basename: POCBNP:3 No results found for this basename: DDIMER in the last 72 hours No results found for this basename: HGBA1C:2 in the last 72 hours No results found for this basename: CHOL:2,HDL:2,LDLCALC:2,TRIG:2,CHOLHDL:2,LDLDIRECT:2 in the last 72 hours No results found for this basename: TSH,T4TOTAL,FREET3,T3FREE,THYROIDAB in the last 72 hours No results found for this basename: VITAMINB12:2,FOLATE:2,FERRITIN:2,TIBC:2,IRON:2,RETICCTPCT:2 in the last 72 hours Imaging results:  Dg Chest 2 View  05/14/2011  *RADIOLOGY REPORT*  Clinical Data: Chest pain  CHEST - 2 VIEW  Comparison: 03/24/2011  Findings: There is a left chest wall AICD with leads in the coronary sinus and right ventricle.  The heart size appears normal.  There is no pleural effusion or edema.  Blunting of the left costophrenic angle is identified, similar to previous exam.  Likely due to scarring.  Stable scar like opacity is noted within the right midlung.  No pulmonary edema or airspace  consolidation.  IMPRESSION:  1.  No active cardiopulmonary abnormalities.  Original Report Authenticated By: Rosealee Albee, M.D.   Impression/Plan:  Principal Problem:  *Weakness generalized Active Problems:  Atrial fibrillation  AUTOMATIC IMPLANTABLE CARDIAC DEFIBRILLATOR SITU  Systolic CHF, chronic  Prostate cancer, recur risk not determined whether low, med or high  Prostate cancer  Pacemaker  S/P CABG (coronary artery bypass graft)  On home oxygen therapy  Chest pain   #1 generalized weakness Questionable etiology. Patient does have a history of prostate cancer and is currently receiving radiation therapy. May be secondary to radiation therapy. Patient's hemoglobin is stable at 8.6. And it was approximately 8 in December of 2012. Patient denies any overt GI bleed. FOBT which was done was positive. Patient's vital signs stable. We'll cycle cardiac enzymes every 8 hours x3 in light of patient's chest pain. Will check a 2-D echo. We'll check a TSH. Will hydrate gently with IV fluids for approximately one day. We'll monitor follow. PT OT for further evaluation and management. If patient's hemoglobin decreases any further may consider transfusion.  #2 chest pain Patient does have a significant cardiac history. He is currently chest pain-free. We will cycle cardiac enzymes every 8 hours x3. Will check a 2-D echo. Will check a d-dimer. Will check a TSH. Check a fasting lipid panel. We'll place on oxygen, nitroglycerin, morphine sulfate, PPI, unable to use aspirin as patient has an allergy to aspirin. Will continue patient's home medications of digoxin, Lasix, Imdur, lisinopril, Toprol, Lipitor. May consider cardiology consultation in the morning.  #3 A. fib status post ICD Stable continue beta blocker for rate control. Coumadin 5 coagulation.  #4 anemia Patient with no overt GI bleed. H&H is stable from December of 2012. Will check an anemia panel. FOBT which was done was positive. Will  monitor and follow. If patient's hemoglobin drops may consider a GI consult for further evaluation and management. Patient was seen by GI during his last hospitalization and conservative treatment was felt to be in order at that time.  #5 coronary artery disease status post CABG  see problem #2. Continue home medications of digoxin, Lasix, Inderal, lisinopril, Toprol, Lipitor.  #6 history of prostate cancer status post seed implantation Patient has been receiving radiation treatments per radiation oncology. Followup as outpatient.  #7 diabetes mellitus Check a hemoglobin A1c. Sliding scale insulin.  #8 prophylaxis Protonix for GI prophylaxis. SCDs for DVT prophylaxis. Patient is currently also on Coumadin.  [name] 05/14/2011, 9:01 PM

## 2011-05-14 NOTE — ED Notes (Signed)
Dr. Lendell Caprice made aware facility is out of Ntg paste, no further orders but to hold at this time.

## 2011-05-14 NOTE — ED Provider Notes (Signed)
History     CSN: [id number]  Arrival date & time 05/14/11  1644   First MD Initiated Contact with Patient 05/14/11 1805      Chief Complaint  Patient presents with  . Chest Pain    (Consider location/radiation/quality/duration/timing/severity/associated sxs/prior treatment) HPI History provided by the patient. History limited secondary to poor historian.  78 year old male with history of CAD, diabetes, prostate cancer receiving radiation treatments presented with complaint of chest pain. Patient states that his chest pain began gradually last night and is left sided and described as prickling.  This pain has been intermittent with 2-3 episodes, last night and the last one today according around 11 AM. Patient currently denies chest pain. Pain does radiate to patient's left lateral chest and left neck and is described as moderate, now gone.  Patient reports occasional shortness of breath; however, he cannot state if this is only associated with his chest pain also occurring at other times.  No associated fever, sweats, chills, nausea, or palpitations. Patient has not appreciated any alleviating or exacerbating factors. Patient states that he cannot recall if this is similar to prior cardiac pain.  Patient was seen this morning for irradiation treatment but did not report his pain at that time.  Past Medical History  Diagnosis Date  . Diabetes mellitus   . Hypertension   . CHF (congestive heart failure)   . CVA (cerebral vascular accident)   . MI (myocardial infarction)   . Pacemaker   . S/P CABG (coronary artery bypass graft)   . Gout   . Nephrolithiasis   . Prostate cancer, recur risk not determined whether low, med or high   . Coronary artery disease   . Hypercholesterolemia   . Black lung disease   . Prostate cancer 11/01/10    gleason 7, 8, 9, gold seeds 02/08/11  . On home oxygen therapy     uses prn, most often in hot weather    Past Surgical History  Procedure Date  .  Colonoscopy   . Icd insertion     status post BIV  . Coronary artery bypass graft   . Esophagogastroduodenoscopy 02/19/2011    Procedure: ESOPHAGOGASTRODUODENOSCOPY (EGD);  Surgeon: Petra Kuba, MD;  Location: [facility] ENDOSCOPY;  Service: Endoscopy;  Laterality: N/A;  . Upper endoscpopy     Family History  Problem Relation Age of Onset  . Alzheimer's disease Father 57  . Cancer Father 6    metastatic prostate cancer  . Diabetes Sister 68  . Diabetes Brother   . Diabetes Brother   . Diabetes Brother   . Hypotension Neg Hx   . Malignant hyperthermia Neg Hx   . Pseudochol deficiency Neg Hx     History  Substance Use Topics  . Smoking status: Never Smoker   . Smokeless tobacco: Current User    Types: Chew  . Alcohol Use: No     drank until 7 mos ago, per pt 03/19/11      Review of Systems  Constitutional: Negative for fever and chills.  HENT: Negative for congestion, sore throat and rhinorrhea.   Eyes: Negative for pain and visual disturbance.  Respiratory: Positive for shortness of breath. Negative for cough.   Cardiovascular: Positive for chest pain. Negative for palpitations.  Gastrointestinal: Negative for nausea, vomiting, abdominal pain and diarrhea.  Genitourinary: Negative for dysuria and hematuria.  Musculoskeletal: Negative for back pain and gait problem.  Skin: Negative for rash and wound.  Neurological: Negative for dizziness and headaches.  Psychiatric/Behavioral: Negative for confusion and agitation.  All other systems reviewed and are negative.    Allergies  Aspirin  Home Medications   Current Outpatient Rx  Name Route Sig Dispense Refill  . ALLOPURINOL 300 MG PO TABS Oral Take 300 mg by mouth daily.      . ATORVASTATIN CALCIUM 20 MG PO TABS Oral Take 20 mg by mouth daily.      [name] CALCIUM-VITAMIN D PO Oral Take 1 tablet by mouth 2 (two) times daily.     [name] VITAMIN B-12 PO Oral Take 1 tablet by mouth 2 (two) times daily.     [name] DIGOXIN 0.125 MG PO TABS  Oral Take 125 mcg by mouth daily.      . OMEGA-3 FATTY ACIDS 1000 MG PO CAPS Oral Take 1 g by mouth 2 (two) times daily.     . FUROSEMIDE 20 MG PO TABS Oral Take 20 mg by mouth every morning.     . IBUPROFEN 200 MG PO TABS Oral Take 200 mg by mouth at bedtime.     . ISOSORBIDE MONONITRATE ER 30 MG PO TB24 Oral Take 30 mg by mouth daily.      [name] LISINOPRIL 10 MG PO TABS Oral Take 10 mg by mouth every morning.     [name] LOPERAMIDE HCL 2 MG PO CAPS Oral Take 2 mg by mouth 4 (four) times daily as needed. For diarrhea    . METOPROLOL SUCCINATE ER 25 MG PO TB24 Oral Take 25 mg by mouth every morning.    [name] OMEPRAZOLE 20 MG PO CPDR Oral Take 20 mg by mouth every morning.     [name] PHENAZOPYRIDINE HCL 200 MG PO TABS Oral Take 200 mg by mouth 3 (three) times daily as needed. For burning associated with UTI    . POTASSIUM CHLORIDE CRYS ER 20 MEQ PO TBCR Oral Take 20 mEq by mouth 2 (two) times daily.      . TRAMADOL HCL 50 MG PO TABS Oral Take 50 mg by mouth every 6 (six) hours as needed. For pain.    . WARFARIN SODIUM 2.5 MG PO TABS Oral Take 1.25-2.5 mg by mouth every evening. Take 1/2 tablet on Mon & Fri & 1 tablet on all other days      BP 115/54  Pulse 44  Temp(Src) 99.3 F (37.4 C) (Oral)  Resp 18  SpO2 100%  Physical Exam  Nursing note and vitals reviewed. Constitutional: He is oriented to person, place, and time. He appears well-developed and well-nourished. No distress.  HENT:  Head: Normocephalic and atraumatic.  Right Ear: External ear normal.  Left Ear: External ear normal.  Nose: Nose normal.  Mouth/Throat: Oropharynx is clear and moist.       Edentulous  Eyes: Conjunctivae and EOM are normal. Pupils are equal, round, and reactive to light.  Neck: Normal range of motion. Neck supple.  Cardiovascular: Normal rate, regular rhythm and intact distal pulses.   No murmur heard. Pulmonary/Chest: Effort normal and breath sounds normal. No respiratory distress.  Abdominal: Soft. Bowel sounds are  normal. He exhibits no distension. There is no tenderness.  Musculoskeletal: Normal range of motion. He exhibits no edema.  Neurological: He is alert and oriented to person, place, and time.  Skin: Skin is dry. No rash noted. He is not diaphoretic.  Psychiatric: He has a normal mood and affect. Judgment normal.    ED Course  Procedures (including critical care time)  Labs Reviewed  CBC - Abnormal;  Notable for the following:    RBC 2.84 (*)    Hemoglobin 8.6 (*)    HCT 25.6 (*)    RDW 18.8 (*)    Platelets 146 (*)    All other components within normal limits  BASIC METABOLIC PANEL - Abnormal; Notable for the following:    Glucose, Bld 119 (*)    BUN 29 (*)    GFR calc non Af Amer 52 (*)    GFR calc Af Amer 60 (*)    All other components within normal limits  POCT I-STAT TROPONIN I  PROTIME-INR   Dg Chest 2 View  05/14/2011  *RADIOLOGY REPORT*  Clinical Data: Chest pain  CHEST - 2 VIEW  Comparison: 03/24/2011  Findings: There is a left chest wall AICD with leads in the coronary sinus and right ventricle.  The heart size appears normal.  There is no pleural effusion or edema.  Blunting of the left costophrenic angle is identified, similar to previous exam.  Likely due to scarring.  Stable scar like opacity is noted within the right midlung.  No pulmonary edema or airspace consolidation.  IMPRESSION:  1.  No active cardiopulmonary abnormalities.  Original Report Authenticated By: Rosealee Albee, M.D.     1. Chest pain   2. Anemia   3.  Occult positive stool    MDM  78 year old male with a history of CAD presenting with complaints of chest pain and shortness of breath, the latter not always associated with this chest pain.  Patient chest pain-free at the time of ED arrival last episode early this morning.  Exam as above, AF, no acute distress, clear lungs. Concern for ACS atypical presentation. Doubt PE, pneumonia, dissection, or pericarditis. Initial EKG without acute ischemia.   Chest x-ray without consolidation. Initial troponin, WBC, and lytes within normal limits.  Hgb 8.6, decreased from most recent, a possible source of pt's pain.  Hemoccult then performed and with no gross blood but hemoccult positive.  [facility] consulted and will admit.      Particia Lather, MD 05/15/11 [phone]

## 2011-05-14 NOTE — Progress Notes (Signed)
Patient presents to the clinic today accompanied by his daughter for an under treat visit with Dr. Dayton Scrape. Patient is alert and oriented to person, place, and time. No distress noted. Steady gait noted. Pleasant affect noted. Patient denies pain at this time. Patient reports that occasionally he experiences burning with urination. Patient denies diarrhea. Patient denies hematuria. Patient reports that he gets up 3-4 times per night to void. Patient reports he isn't sleeping well. Patient reports his appetite is "as it always has been." Six pound weight loss noted since 04/09/2011. Reported all findings to Dr Dayton Scrape.

## 2011-05-15 ENCOUNTER — Ambulatory Visit: Payer: PRIVATE HEALTH INSURANCE

## 2011-05-15 DIAGNOSIS — I359 Nonrheumatic aortic valve disorder, unspecified: Secondary | ICD-10-CM

## 2011-05-15 LAB — BASIC METABOLIC PANEL
BUN: 23 mg/dL (ref 6–23)
Chloride: 105 mEq/L (ref 96–112)
GFR calc Af Amer: 67 mL/min — ABNORMAL LOW (ref >90)
Potassium: 4.2 mEq/L (ref 3.5–5.1)
Sodium: 136 mEq/L (ref 135–145)

## 2011-05-15 LAB — CARDIAC PANEL(CRET KIN+CKTOT+MB+TROPI)
CK, MB: 2.6 ng/mL (ref 0.3–4.0)
CK, MB: 2.7 ng/mL (ref 0.3–4.0)
Total CK: 49 U/L (ref 7–232)
Troponin I: <0.3 ng/mL (ref <0.30)
Troponin I: <0.3 ng/mL (ref <0.30)

## 2011-05-15 LAB — CBC
HCT: 23.9 % — ABNORMAL LOW (ref 39.0–52.0)
Hemoglobin: 7.9 g/dL — ABNORMAL LOW (ref 13.0–17.0)
MCHC: 33.1 g/dL (ref 30.0–36.0)
RBC: 2.67 MIL/uL — ABNORMAL LOW (ref 4.22–5.81)

## 2011-05-15 LAB — URINE MICROSCOPIC-ADD ON

## 2011-05-15 LAB — FERRITIN: Ferritin: 1026 ng/mL — ABNORMAL HIGH (ref 22–322)

## 2011-05-15 LAB — LIPID PANEL
Cholesterol: 106 mg/dL (ref 0–200)
HDL: 22 mg/dL — ABNORMAL LOW (ref >39)
Total CHOL/HDL Ratio: 4.8 RATIO
Triglycerides: 348 mg/dL — ABNORMAL HIGH (ref <150)
VLDL: 70 mg/dL — ABNORMAL HIGH (ref 0–40)

## 2011-05-15 LAB — VITAMIN B12: Vitamin B-12: 1788 pg/mL — ABNORMAL HIGH (ref 211–911)

## 2011-05-15 LAB — RETICULOCYTES
RBC.: 2.55 MIL/uL — ABNORMAL LOW (ref 4.22–5.81)
Retic Ct Pct: 2.1 % (ref 0.4–3.1)

## 2011-05-15 LAB — IRON AND TIBC
Iron: 84 ug/dL (ref 42–135)
Saturation Ratios: 42 % (ref 20–55)
TIBC: 261 ug/dL (ref 215–435)

## 2011-05-15 LAB — URINALYSIS, ROUTINE W REFLEX MICROSCOPIC
Leukocytes, UA: NEGATIVE
Protein, ur: NEGATIVE mg/dL
Urobilinogen, UA: 1 mg/dL (ref 0.0–1.0)

## 2011-05-15 LAB — APTT: aPTT: 37 seconds (ref 24–37)

## 2011-05-15 LAB — HEPATIC FUNCTION PANEL
ALT: 39 U/L (ref 0–53)
Alkaline Phosphatase: 83 U/L (ref 39–117)
Total Protein: 7.2 g/dL (ref 6.0–8.3)

## 2011-05-15 LAB — GLUCOSE, CAPILLARY: Glucose-Capillary: 111 mg/dL — ABNORMAL HIGH (ref 70–99)

## 2011-05-15 LAB — TSH: TSH: 1.936 u[IU]/mL (ref 0.350–4.500)

## 2011-05-15 LAB — PROTIME-INR: INR: 2.39 — ABNORMAL HIGH (ref 0.00–1.49)

## 2011-05-15 NOTE — Evaluation (Signed)
Physical Therapy One-Time Evaluation/Discharge Note Patient Details Name: [name] MRN: [id number] DOB: 07/07/1932 Today's Date: 05/15/2011  Problem List:  Patient Active Problem List  Diagnoses  . HEART BLOCK  . Atrial fibrillation  . AUTOMATIC IMPLANTABLE CARDIAC DEFIBRILLATOR SITU  . Syncope and collapse  . Chest pain on exertion  . SBO (small bowel obstruction)  . ARF (acute renal failure)  . UTI (lower urinary tract infection)  . Systolic CHF, chronic  . Prostate cancer, recur risk not determined whether low, med or high  . Hematochezia  . Melena  . Prostate cancer  . Pacemaker  . S/P CABG (coronary artery bypass graft)  . On home oxygen therapy  . Weakness generalized  . Chest pain    Past Medical History:  Past Medical History  Diagnosis Date  . Diabetes mellitus   . Hypertension   . CHF (congestive heart failure)   . S/P CABG (coronary artery bypass graft)   . Gout   . Nephrolithiasis   . Prostate cancer, recur risk not determined whether low, med or high   . Coronary artery disease   . Hypercholesterolemia   . Black lung disease   . Kidney stone 09/2000  . Angina   . MI (myocardial infarction)     "I've had 2; didn't know it"  . CVA (cerebral vascular accident)     "I've had 2; didn't know it"  . Bradycardia, sinus   . On home oxygen therapy   . Prostate cancer 11/01/10    gleason 7, 8, 9, gold seeds 02/08/11  . Lung cancer   . COPD (chronic obstructive pulmonary disease)   . Shortness of breath   . GERD (gastroesophageal reflux disease)   . AICD (automatic cardioverter/defibrillator) present   . Arthritis    Past Surgical History:  Past Surgical History  Procedure Date  . Colonoscopy   . Esophagogastroduodenoscopy 02/19/2011    Procedure: ESOPHAGOGASTRODUODENOSCOPY (EGD);  Surgeon: Petra Kuba, MD;  Location: [facility] ENDOSCOPY;  Service: Endoscopy;  Laterality: N/A;  . Upper endoscpopy   . Coronary angioplasty with stent placement 07/1997;  08/1997;03/1998  . Coronary artery bypass graft 01/2000    CABG  X2  . Coronary artery bypass graft 07/2002    CABG X3  . Incision and drainage of wound 08/2002    right thigh; S/P EVH  . Insert / replace / remove pacemaker 1979; 1992; 01/2000;  . Insert / replace / remove pacemaker 09/2003; 06/2006    w/AICD  . Insert / replace / remove pacemaker 12/2004    pacmaker explant  . Shoulder arthroscopy w/ rotator cuff repair 05/2008    left    PT Assessment/Plan/Recommendation PT Assessment Clinical Impression Statement: 78 y.o. male admitted to [facility] for chest pain with history of prostate CA currently recieving XRT treatments.  He presents at baseline level of mobility and has enough assist from daughters at home.  No acute or f/u PT needed at this time.   PT Recommendation/Assessment: Patent does not need any further PT services No Skilled PT: Patient at baseline level of functioning;Patient will have necessary level of assist by caregiver at discharge;Patient is independent with all acitivity/mobility PT Recommendation Follow Up Recommendations: No PT follow up Equipment Recommended: None recommended by PT  PT Evaluation Precautions/Restrictions  Precautions Precaution Comments: monitor O2 with gait Prior Functioning  Home Living Lives With: Daughter (2 daughters) Dolores Lory Help From: Family (PRN) Type of Home: House Home Layout: One level Home Access: Level entry Home  Adaptive Equipment:  (home O2) Additional Comments: patient uses O2 at home PRN Prior Function Level of Independence: Independent with basic ADLs;Independent with gait;Independent with transfers Able to Take Stairs?: Yes Driving: No (daughters drive him) Cognition Cognition Arousal/Alertness: Awake/alert Extremity Assessment RLE Assessment RLE Assessment: Within Functional Limits (per gross functional assessment) LLE Assessment LLE Assessment: Within Functional Limits (per gross functional assessment) Mobility  (including Balance) Bed Mobility Supine to Sit: 7: Independent;HOB flat Sitting - Scoot to Edge of Bed: 7: Independent Sit to Supine: 7: Independent;HOB flat Transfers Sit to Stand: 7: Independent Stand to Sit: 7: Independent Ambulation/Gait Ambulation/Gait: Yes Ambulation/Gait Assistance: 7: Independent Ambulation Distance (Feet): 300 Feet Assistive device: None Gait Pattern: Step-through pattern Gait velocity: normal gait speed  Dynamic Standing Balance Dynamic Standing - Balance Support: No upper extremity supported Dynamic Standing - Level of Assistance: 7: Independent Dynamic Standing - Balance Activities:  (360 degree turns, pick up object from floor) Dynamic Standing - Comments: performed well with basic balance screen.   End of Session PT - End of Session Activity Tolerance: Patient tolerated treatment well Patient left: in bed;with call bell in reach Nurse Communication: Mobility status for transfers;Mobility status for ambulation (no PT needed) General Behavior During Session: [facility] for tasks performed Cognition: [facility] for tasks performed  Malvin Morrish B. Kirstein Baxley, PT, DPT [phone] 05/15/2011, 3:17 PM

## 2011-05-15 NOTE — Progress Notes (Addendum)
Subjective: Doing better, denies CP/SoB, diarrhea, no melena or hemetemesis  Objective: Vital signs in last 24 hours: Temp:  [97.6 F (36.4 C)-99.3 F (37.4 C)] 97.9 F (36.6 C) (03/05 0613) Pulse Rate:  [44-60] 60  (03/05 0613) Resp:  [18-20] 18  (03/05 0613) BP: (115-136)/(54-65) 123/56 mmHg (03/05 0613) SpO2:  [98 %-100 %] 100 % (03/05 0613) Weight:  [59.24 kg (130 lb 9.6 oz)-64.819 kg (142 lb 14.4 oz)] 59.24 kg (130 lb 9.6 oz) (03/05 6962) Weight change:  Last BM Date: 05/14/11  Intake/Output from previous day: 03/04 0701 - 03/05 0700 In: 1036.8 [P.O.:480; I.V.:556.8] Out: 450 [Urine:450] Total I/O In: 480 [P.O.:480] Out: -  Physical Exam: General: Alert, awake, oriented x3, in no acute distress. HEENT: No bruits, no goiter. Heart: Regular rate and rhythm, systolic murmur Lungs: Clear to auscultation bilaterally. Abdomen: Soft, nontender, nondistended, positive bowel sounds. Extremities: No clubbing cyanosis or edema with positive pedal pulses. Neuro: Grossly intact, nonfocal.  Lab Results: Basic Metabolic Panel:  Basename 05/15/11 0550 05/14/11 2230 05/14/11 1712  NA 136 -- 135  K 4.2 -- 4.4  CL 105 -- 104  CO2 20 -- 21  GLUCOSE 90 -- 119*  BUN 23 -- 29*  CREATININE 1.18 -- 1.29  CALCIUM 9.5 -- 9.8  MG -- 1.3* --  PHOS -- -- --   Liver Function Tests:  [facility] 05/14/11 2116  AST 26  ALT 39  ALKPHOS 83  BILITOT 0.5  PROT 7.2  ALBUMIN 3.5   No results found for this basename: LIPASE:2,AMYLASE:2 in the last 72 hours No results found for this basename: AMMONIA:2 in the last 72 hours CBC:  Basename 05/15/11 0550 05/14/11 1712  WBC 3.3* 4.6  NEUTROABS -- --  HGB 7.9* 8.6*  HCT 23.9* 25.6*  MCV 89.5 90.1  PLT 123* 146*   Cardiac Enzymes:  Basename 05/15/11 0550 05/14/11 2135  CKTOTAL 47 57  CKMB 2.6 2.4  CKMBINDEX -- --  TROPONINI <0.30 <0.30   BNP: No results found for this basename: PROBNP:3 in the last 72 hours D-Dimer:  [facility]  05/14/11 2116  DDIMER 0.44   CBG:  Basename 05/15/11 0602 05/14/11 2325  GLUCAP 92 113*   Hemoglobin A1C: No results found for this basename: HGBA1C in the last 72 hours Fasting Lipid Panel:  Basename 05/15/11 0550  CHOL 106  HDL 22*  LDLCALC 14  TRIG 952*  CHOLHDL 4.8  LDLDIRECT --   Thyroid Function Tests: No results found for this basename: TSH,T4TOTAL,FREET4,T3FREE,THYROIDAB in the last 72 hours Anemia Panel: No results found for this basename: VITAMINB12,FOLATE,FERRITIN,TIBC,IRON,RETICCTPCT in the last 72 hours Coagulation:  Basename 05/15/11 0550 05/14/11 2116  LABPROT 26.5* 24.7*  INR 2.39* 2.19*   Urine Drug Screen: Drugs of Abuse  No results found for this basename: labopia, cocainscrnur, labbenz, amphetmu, thcu, labbarb    Alcohol Level: No results found for this basename: ETH:2 in the last 72 hours Urinalysis:  Basename 05/14/11 2340  COLORURINE ORANGE*  LABSPEC 1.011  PHURINE 5.0  GLUCOSEU NEGATIVE  HGBUR TRACE*  BILIRUBINUR NEGATIVE  KETONESUR NEGATIVE  PROTEINUR NEGATIVE  UROBILINOGEN 1.0  NITRITE POSITIVE*  LEUKOCYTESUR NEGATIVE    Recent Results (from the past 240 hour(s))  MRSA PCR SCREENING     Status: Normal   Collection Time   05/14/11  9:59 PM      Component Value Range Status Comment   MRSA by PCR NEGATIVE  NEGATIVE  Final     Studies/Results: Dg Chest 2 View  05/14/2011  *  RADIOLOGY REPORT*  Clinical Data: Chest pain  CHEST - 2 VIEW  Comparison: 03/24/2011  Findings: There is a left chest wall AICD with leads in the coronary sinus and right ventricle.  The heart size appears normal.  There is no pleural effusion or edema.  Blunting of the left costophrenic angle is identified, similar to previous exam.  Likely due to scarring.  Stable scar like opacity is noted within the right midlung.  No pulmonary edema or airspace consolidation.  IMPRESSION:  1.  No active cardiopulmonary abnormalities.  Original Report Authenticated By: Rosealee Albee, M.D.    Medications: Scheduled Meds:   . allopurinol  300 mg Oral Daily  . aspirin  324 mg Oral Once  . digoxin  125 mcg Oral Daily  . diphenhydrAMINE  25 mg Oral Once  . furosemide  20 mg Oral Daily  . insulin aspart  0-9 Units Subcutaneous TID WC  . isosorbide mononitrate  30 mg Oral Daily  . lisinopril  10 mg Oral Daily  . metoprolol succinate  25 mg Oral Daily  . omega-3 acid ethyl esters  1 g Oral BID  . pantoprazole  40 mg Oral Q1200  . phenazopyridine  200 mg Oral TID WC  . potassium chloride SA  20 mEq Oral BID  . simvastatin  40 mg Oral q1800  . sodium chloride  3 mL Intravenous Q12H  . vitamin B-12  100 mcg Oral BID  . warfarin  1.25 mg Oral Once  . Warfarin - Pharmacist Dosing Inpatient   Does not apply q1800  . DISCONTD: fish oil-omega-3 fatty acids  1 g Oral BID  . DISCONTD: nitroGLYCERIN  0.5 inch Topical Once  . DISCONTD: warfarin  1.25-2.5 mg Oral QPM   Continuous Infusions:   . sodium chloride 75 mL/hr at 05/14/11 2333   PRN Meds:.acetaminophen, acetaminophen, alum & mag hydroxide-simeth, loperamide, morphine, ondansetron (ZOFRAN) IV, ondansetron, oxyCODONE, senna-docusate, traMADol  Assessment/Plan: 1. Weakness generalized: suspect multifactorial in etiology, anemia, prostate cancer on XRT No evidence of acute infection Follow up urine cultures stop IVF Get PT eval 2. CP atypical transient resolved, d-dimer negative, cardiac enzymes x3 negative FU echocardiogram, h/o CABG 3. Anemia, heme positive stools- seen by [facility] last hospitalization in December, recommended conservative mgt only EGD-hiatal hernia, didn't have a colonoscopy, had CT enterography to evaluate colon- was unremarkable Will stop coumadin, risk/benefit explained to daughter Dawn  Check anemia panel, transfuse if Hb continue to trend down, Hb was in 8.1-8.4 range last admission 4. P.Atrial fibrillation: rate controlled, in NSR now, continue toprol/digoxin stop coumadin due to  anemia/ persistent heme positive stools. 5. Systolic CHF, chronic, with AICD, currently compensated, resume PO lasix, continue BB, ace, FU echo 6. Prostate cancer on XRT DVt prophylaxis: SCDs OOB, PT eval Called and updated daughter [name] today   LOS: 1 day   [facility] Pager: [phone] 05/15/2011, 9:23 AM

## 2011-05-15 NOTE — Progress Notes (Signed)
  Echocardiogram 2D Echocardiogram has been performed.  [name] [name] 05/15/2011, 12:58 PM

## 2011-05-15 NOTE — Progress Notes (Signed)
Utilization Review Completed.  [name], [place] T  05/15/2011

## 2011-05-15 NOTE — Progress Notes (Signed)
Occupational Therapy Evaluation Patient Details Name: DEUNDRE THONG MRN: [id number] DOB: 15-Sep-1932 Today's Date: 05/15/2011  Problem List:  Patient Active Problem List  Diagnoses  . HEART BLOCK  . Atrial fibrillation  . AUTOMATIC IMPLANTABLE CARDIAC DEFIBRILLATOR SITU  . Syncope and collapse  . Chest pain on exertion  . SBO (small bowel obstruction)  . ARF (acute renal failure)  . UTI (lower urinary tract infection)  . Systolic CHF, chronic  . Prostate cancer, recur risk not determined whether low, med or high  . Hematochezia  . Melena  . Prostate cancer  . Pacemaker  . S/P CABG (coronary artery bypass graft)  . On home oxygen therapy  . Weakness generalized  . Chest pain    Past Medical History:  Past Medical History  Diagnosis Date  . Diabetes mellitus   . Hypertension   . CHF (congestive heart failure)   . S/P CABG (coronary artery bypass graft)   . Gout   . Nephrolithiasis   . Prostate cancer, recur risk not determined whether low, med or high   . Coronary artery disease   . Hypercholesterolemia   . Black lung disease   . Kidney stone 09/2000  . Angina   . MI (myocardial infarction)     "I've had 2; didn't know it"  . CVA (cerebral vascular accident)     "I've had 2; didn't know it"  . Bradycardia, sinus   . On home oxygen therapy   . Prostate cancer 11/01/10    gleason 7, 8, 9, gold seeds 02/08/11  . Lung cancer   . COPD (chronic obstructive pulmonary disease)   . Shortness of breath   . GERD (gastroesophageal reflux disease)   . AICD (automatic cardioverter/defibrillator) present   . Arthritis    Past Surgical History:  Past Surgical History  Procedure Date  . Colonoscopy   . Esophagogastroduodenoscopy 02/19/2011    Procedure: ESOPHAGOGASTRODUODENOSCOPY (EGD);  Surgeon: Petra Kuba, MD;  Location: [facility] ENDOSCOPY;  Service: Endoscopy;  Laterality: N/A;  . Upper endoscpopy   . Coronary angioplasty with stent placement 07/1997; 08/1997;03/1998  .  Coronary artery bypass graft 01/2000    CABG  X2  . Coronary artery bypass graft 07/2002    CABG X3  . Incision and drainage of wound 08/2002    right thigh; S/P EVH  . Insert / replace / remove pacemaker 1979; 1992; 01/2000;  . Insert / replace / remove pacemaker 09/2003; 06/2006    w/AICD  . Insert / replace / remove pacemaker 12/2004    pacmaker explant  . Shoulder arthroscopy w/ rotator cuff repair 05/2008    left    OT Assessment/Plan/Recommendation OT Assessment Clinical Impression Statement: 78 yo admitted for chest pain. Pt lives with family and is overall mod i with ADL and mobility. No further OT needed, however, pt would benefit from an outpatient ortho visit. Pt appears to demonstrate symptoms of L shoulder pain consistent with impingement syndrome. OT Recommendation/Assessment: Patient does not need any further OT services OT Recommendation Recommendations for Other Services: Other (comment) (outpatient ortho consult) Follow Up Recommendations: No OT follow up Equipment Recommended: None recommended by OT OT Goals Acute Rehab OT Goals OT Goal Formulation:  (eval only)  OT Evaluation Precautions/Restrictions  Precautions Precaution Comments: monitor O2 with gait Prior Functioning Home Living Lives With: Daughter (2 daughters) Receives Help From: Family (PRN) Type of Home: House Home Layout: One level Home Access: Level entry Home Adaptive Equipment:  (home O2) Additional Comments:  patient uses O2 at home PRN Prior Function Level of Independence: Independent with basic ADLs;Independent with gait;Independent with transfers Able to Take Stairs?: Yes Driving: No (daughters drive him) ADL ADL Eating/Feeding: Independent Where Assessed - Eating/Feeding: Chair Grooming: Simulated;Modified independent Where Assessed - Grooming: Standing at sink Upper Body Bathing: Simulated;Supervision/safety Where Assessed - Upper Body Bathing: Standing at sink Lower Body Bathing:  Simulated;Supervision/safety Where Assessed - Lower Body Bathing: Sitting, chair;Sit to stand from chair Upper Body Dressing: Simulated;Supervision/safety Where Assessed - Upper Body Dressing: Sitting, chair Lower Body Dressing: Simulated;Supervision/safety Where Assessed - Lower Body Dressing: Sitting, chair;Sit to stand from chair Toilet Transfer: Buyer, retail Method: Proofreader: Regular height toilet Toileting - Clothing Manipulation: Simulated;Modified independent Where Assessed - Toileting Clothing Manipulation: Standing Toileting - Hygiene: Simulated;Independent Where Assessed - Toileting Hygiene: Standing Tub/Shower Transfer: Not assessed Ambulation Related to ADLs: supervision ADL Comments: supervision to Mod I Vision/Perception  Vision - History Baseline Vision: No visual deficits Cognition Cognition Arousal/Alertness: Awake/alert Overall Cognitive Status: Appears within functional limits for tasks assessed Sensation/Coordination   Extremity Assessment RUE Assessment RUE Assessment: Within Functional Limits LUE Assessment LUE Assessment: Exceptions to [facility] LUE AROM (degrees) Left Shoulder Flexion  0-170:  (0-90) Left Shoulder Internal Rotation  0-70:  (0-35) Left Shoulder External Rotation  0-90:  (0-35) Mobility  Bed Mobility Supine to Sit: 7: Independent;HOB flat Sitting - Scoot to Edge of Bed: 7: Independent Sit to Supine: 7: Independent;HOB flat Transfers Sit to Stand: 7: Independent Stand to Sit: 7: Independent Exercises   End of Session OT - End of Session Equipment Utilized During Treatment: Gait belt Activity Tolerance: Patient tolerated treatment well Patient left: in chair;with call bell in reach General Behavior During Session: [facility] for tasks performed Cognition: [facility] for tasks performed   [facility] 05/15/2011, 6:31 PM  [facility], OTR/L  [phone] 05/15/2011

## 2011-05-16 ENCOUNTER — Ambulatory Visit: Payer: PRIVATE HEALTH INSURANCE

## 2011-05-16 LAB — BASIC METABOLIC PANEL
BUN: 18 mg/dL (ref 6–23)
Calcium: 9.6 mg/dL (ref 8.4–10.5)
Creatinine, Ser: 1.03 mg/dL (ref 0.50–1.35)
GFR calc Af Amer: 79 mL/min — ABNORMAL LOW (ref >90)
GFR calc non Af Amer: 68 mL/min — ABNORMAL LOW (ref >90)
Potassium: 4.9 mEq/L (ref 3.5–5.1)

## 2011-05-16 LAB — GLUCOSE, CAPILLARY

## 2011-05-16 LAB — CBC
HCT: 24.8 % — ABNORMAL LOW (ref 39.0–52.0)
MCHC: 34.3 g/dL (ref 30.0–36.0)
MCV: 88.3 fL (ref 78.0–100.0)
RDW: 19 % — ABNORMAL HIGH (ref 11.5–15.5)

## 2011-05-16 LAB — URINE CULTURE: Culture: NO GROWTH

## 2011-05-16 MED FILL — Perflutren Lipid Microsphere IV Susp 6.52 MG/ML: INTRAVENOUS | Qty: 2 | Status: AC

## 2011-05-16 NOTE — Progress Notes (Signed)
Patient ID: [name], male   DOB: [birth year], 78 y.o.   MRN: [id number]  Subjective: No events overnight. Patient denies chest pain, shortness of breath, abdominal pain.   Objective:  Vital signs in last 24 hours:  Filed Vitals:   05/15/11 2136 05/16/11 0505 05/16/11 1058 05/16/11 1341  BP: 113/52 119/53 110/55 123/47  Pulse: 60 63 62 60  Temp: 97.9 F (36.6 C) 98 F (36.7 C)  98.3 F (36.8 C)  TempSrc:    Oral  Resp: 18 18  20   Weight:  62.5 kg (137 lb 12.6 oz)    SpO2: 99% 100%  100%    Intake/Output from previous day:   Intake/Output Summary (Last 24 hours) at 05/16/11 2134 Last data filed at 05/16/11 2026  Gross per 24 hour  Intake   1506 ml  Output   1650 ml  Net   -144 ml    Physical Exam: General: Alert, awake, oriented x3, in no acute distress. HEENT: No bruits, no goiter. Moist mucous membranes, no scleral icterus, no conjunctival pallor. Heart: Regular rate and rhythm, S1/S2 +, no murmurs, rubs, gallops. Lungs: Clear to auscultation bilaterally. No wheezing, no rhonchi, no rales.  Abdomen: Soft, nontender, nondistended, positive bowel sounds. Extremities: No clubbing or cyanosis, no pitting edema,  positive pedal pulses. Neuro: Grossly nonfocal.  Lab Results:  Basic Metabolic Panel:    Component Value Date/Time   NA 130* 05/16/2011 0546   K 4.9 05/16/2011 0546   CL 99 05/16/2011 0546   CO2 19 05/16/2011 0546   BUN 18 05/16/2011 0546   CREATININE 1.03 05/16/2011 0546   GLUCOSE 102* 05/16/2011 0546   CALCIUM 9.6 05/16/2011 0546   CBC:    Component Value Date/Time   WBC 5.6 05/16/2011 0546   HGB 8.5* 05/16/2011 0546   HCT 24.8* 05/16/2011 0546   PLT 141* 05/16/2011 0546   MCV 88.3 05/16/2011 0546   NEUTROABS 4.2 03/24/2011 0755   LYMPHSABS 1.1 03/24/2011 0755   MONOABS 0.6 03/24/2011 0755   EOSABS 0.1 03/24/2011 0755   BASOSABS 0.0 03/24/2011 0755      Lab 05/16/11 0546 05/15/11 0550 05/14/11 1712  WBC 5.6 3.3* 4.6  HGB 8.5* 7.9* 8.6*  HCT 24.8* 23.9* 25.6*    PLT 141* 123* 146*  MCV 88.3 89.5 90.1  MCH 30.2 29.6 30.3  MCHC 34.3 33.1 33.6  RDW 19.0* 18.9* 18.8*  LYMPHSABS -- -- --  MONOABS -- -- --  EOSABS -- -- --  BASOSABS -- -- --  BANDABS -- -- --    Lab 05/16/11 0546 05/15/11 0550 05/14/11 2230 05/14/11 1712  NA 130* 136 -- 135  K 4.9 4.2 -- 4.4  CL 99 105 -- 104  CO2 19 20 -- 21  GLUCOSE 102* 90 -- 119*  BUN 18 23 -- 29*  CREATININE 1.03 1.18 -- 1.29  CALCIUM 9.6 9.5 -- 9.8  MG -- -- 1.3* --    Lab 05/15/11 0550 05/14/11 2116 05/14/11 2008  INR 2.39* 2.19* 2.24*  PROTIME -- -- --   Cardiac markers:  Lab 05/15/11 1338 05/15/11 0550 05/14/11 2135  CKMB 2.7 2.6 2.4  TROPONINI <0.30 <0.30 <0.30  MYOGLOBIN -- -- --    Recent Results (from the past 240 hour(s))  MRSA PCR SCREENING     Status: Normal   Collection Time   05/14/11  9:59 PM      Component Value Range Status Comment   MRSA by PCR NEGATIVE  NEGATIVE  Final  URINE CULTURE     Status: Normal   Collection Time   05/14/11 11:42 PM      Component Value Range Status Comment   Specimen Description URINE, CLEAN CATCH   Final    Special Requests NONE   Final    Culture  Setup Time [id number]   Final    Colony Count NO GROWTH   Final    Culture NO GROWTH   Final    Report Status 05/16/2011 FINAL   Final     Studies/Results: No results found.  Medications: Scheduled Meds:   . allopurinol  300 mg Oral Daily  . digoxin  125 mcg Oral Daily  . furosemide  20 mg Oral Daily  . insulin aspart  0-9 Units Subcutaneous TID WC  . isosorbide mononitrate  30 mg Oral Daily  . lisinopril  10 mg Oral Daily  . metoprolol succinate  25 mg Oral Daily  . omega-3 acid ethyl esters  1 g Oral BID  . pantoprazole  40 mg Oral Q1200  . phenazopyridine  200 mg Oral TID WC  . potassium chloride SA  20 mEq Oral BID  . simvastatin  40 mg Oral q1800  . sodium chloride  3 mL Intravenous Q12H  . vitamin B-12  100 mcg Oral BID   Continuous Infusions:  PRN Meds:.acetaminophen,  acetaminophen, alum & mag hydroxide-simeth, loperamide, morphine, ondansetron (ZOFRAN) IV, ondansetron, oxyCODONE, senna-docusate, traMADol  Assessment/Plan:  1. Weakness generalized: suspect multifactorial in etiology, anemia, prostate cancer on XRT. No evidence of acute infection. Follow up urine cultures, stop IVF, Get PT eval  2. CP atypical transient resolved, d-dimer negative, cardiac enzymes x3 negative, FU echocardiogram, h/o CABG  3. Anemia, heme positive stools- seen by [facility] last hospitalization in December, recommended conservative mgt only. EGD-hiatal hernia, didn't have a colonoscopy, had CT enterography to evaluate colon- was unremarkable. Will stop coumadin. Check anemia panel, transfuse if Hb continue to trend down, Hb was in 8.1-8.4 range last admission  4. P.Atrial fibrillation: rate controlled, in NSR now, continue toprol/digoxin stop coumadin due to anemia/ persistent heme positive stools.  5. Systolic CHF, chronic, with AICD, currently compensated, resume PO lasix, continue BB, ace, FU echo  6. Prostate cancer on XRT    EDUCATION - test results and diagnostic studies were discussed with patient - patient verbalized the understanding - questions were answered at the bedside and contact information was provided for additional questions or concerns   LOS: 2 days   MAGICK-Bettyanne Dittman 05/16/2011, 9:34 PM  [facility] Pager: [phone]

## 2011-05-17 ENCOUNTER — Ambulatory Visit: Payer: PRIVATE HEALTH INSURANCE

## 2011-05-17 LAB — BASIC METABOLIC PANEL
BUN: 15 mg/dL (ref 6–23)
Chloride: 94 mEq/L — ABNORMAL LOW (ref 96–112)
GFR calc Af Amer: 79 mL/min — ABNORMAL LOW (ref >90)
Glucose, Bld: 102 mg/dL — ABNORMAL HIGH (ref 70–99)
Potassium: 4.7 mEq/L (ref 3.5–5.1)

## 2011-05-17 LAB — CBC
HCT: 24.2 % — ABNORMAL LOW (ref 39.0–52.0)
Hemoglobin: 8.1 g/dL — ABNORMAL LOW (ref 13.0–17.0)
RBC: 2.74 MIL/uL — ABNORMAL LOW (ref 4.22–5.81)
WBC: 4.8 10*3/uL (ref 4.0–10.5)

## 2011-05-17 LAB — GLUCOSE, CAPILLARY
Glucose-Capillary: 116 mg/dL — ABNORMAL HIGH (ref 70–99)
Glucose-Capillary: 118 mg/dL — ABNORMAL HIGH (ref 70–99)
Glucose-Capillary: 108 mg/dL — ABNORMAL HIGH (ref 70–99)

## 2011-05-17 NOTE — Progress Notes (Signed)
Patient ID: [name], male   DOB: [birth year], 78 y.o.   MRN: [id number]  Subjective: No events overnight. Patient denies chest pain, shortness of breath, abdominal pain. Reports 4 episodes of diarrhea.  Objective:  Vital signs in last 24 hours:  Filed Vitals:   05/16/11 2137 05/17/11 0621 05/17/11 1042 05/17/11 1429  BP: 111/57 124/53  137/53  Pulse: 61 69 62 61  Temp: 99.1 F (37.3 C) 98.5 F (36.9 C)  98.5 F (36.9 C)  TempSrc:    Oral  Resp: 20 16  18   Weight:  63.5 kg (139 lb 15.9 oz)    SpO2: 99% 99%  99%    Intake/Output from previous day:   Intake/Output Summary (Last 24 hours) at 05/17/11 2100 Last data filed at 05/17/11 1823  Gross per 24 hour  Intake    993 ml  Output   2050 ml  Net  -1057 ml    Physical Exam: General: Alert, awake, oriented x3, in no acute distress. HEENT: No bruits, no goiter. Moist mucous membranes, no scleral icterus, no conjunctival pallor. Heart: Regular rate and rhythm, S1/S2 +, no murmurs, rubs, gallops. Lungs: Clear to auscultation bilaterally. No wheezing, no rhonchi, no rales.  Abdomen: Soft, nontender, nondistended, positive bowel sounds. Extremities: No clubbing or cyanosis, no pitting edema,  positive pedal pulses. Neuro: Grossly nonfocal.  Lab Results:  Basic Metabolic Panel:    Component Value Date/Time   NA 127* 05/17/2011 0512   K 4.7 05/17/2011 0512   CL 94* 05/17/2011 0512   CO2 24 05/17/2011 0512   BUN 15 05/17/2011 0512   CREATININE 1.03 05/17/2011 0512   GLUCOSE 102* 05/17/2011 0512   CALCIUM 9.4 05/17/2011 0512   CBC:    Component Value Date/Time   WBC 4.8 05/17/2011 0512   HGB 8.1* 05/17/2011 0512   HCT 24.2* 05/17/2011 0512   PLT 120* 05/17/2011 0512   MCV 88.3 05/17/2011 0512   NEUTROABS 4.2 03/24/2011 0755   LYMPHSABS 1.1 03/24/2011 0755   MONOABS 0.6 03/24/2011 0755   EOSABS 0.1 03/24/2011 0755   BASOSABS 0.0 03/24/2011 0755      Lab 05/17/11 0512 05/16/11 0546 05/15/11 0550 05/14/11 1712  WBC 4.8 5.6 3.3* 4.6    HGB 8.1* 8.5* 7.9* 8.6*  HCT 24.2* 24.8* 23.9* 25.6*  PLT 120* 141* 123* 146*  MCV 88.3 88.3 89.5 90.1  MCH 29.6 30.2 29.6 30.3  MCHC 33.5 34.3 33.1 33.6  RDW 18.9* 19.0* 18.9* 18.8*  LYMPHSABS -- -- -- --  MONOABS -- -- -- --  EOSABS -- -- -- --  BASOSABS -- -- -- --  BANDABS -- -- -- --    Lab 05/17/11 0512 05/16/11 0546 05/15/11 0550 05/14/11 2230 05/14/11 1712  NA 127* 130* 136 -- 135  K 4.7 4.9 4.2 -- 4.4  CL 94* 99 105 -- 104  CO2 24 19 20  -- 21  GLUCOSE 102* 102* 90 -- 119*  BUN 15 18 23  -- 29*  CREATININE 1.03 1.03 1.18 -- 1.29  CALCIUM 9.4 9.6 9.5 -- 9.8  MG -- -- -- 1.3* --    Lab 05/15/11 0550 05/14/11 2116 05/14/11 2008  INR 2.39* 2.19* 2.24*  PROTIME -- -- --   Cardiac markers:  Lab 05/15/11 1338 05/15/11 0550 05/14/11 2135  CKMB 2.7 2.6 2.4  TROPONINI <0.30 <0.30 <0.30  MYOGLOBIN -- -- --   No components found with this basename: POCBNP:3 Recent Results (from the past 240 hour(s))  MRSA PCR SCREENING  Status: Normal   Collection Time   05/14/11  9:59 PM      Component Value Range Status Comment   MRSA by PCR NEGATIVE  NEGATIVE  Final   URINE CULTURE     Status: Normal   Collection Time   05/14/11 11:42 PM      Component Value Range Status Comment   Specimen Description URINE, CLEAN CATCH   Final    Special Requests NONE   Final    Culture  Setup Time [id number]   Final    Colony Count NO GROWTH   Final    Culture NO GROWTH   Final    Report Status 05/16/2011 FINAL   Final     Studies/Results: No results found.  Medications: Scheduled Meds:   . allopurinol  300 mg Oral Daily  . digoxin  125 mcg Oral Daily  . insulin aspart  0-9 Units Subcutaneous TID WC  . isosorbide mononitrate  30 mg Oral Daily  . lisinopril  10 mg Oral Daily  . metoprolol succinate  25 mg Oral Daily  . omega-3 acid ethyl esters  1 g Oral BID  . pantoprazole  40 mg Oral Q1200  . phenazopyridine  200 mg Oral TID WC  . potassium chloride SA  20 mEq Oral BID  .  simvastatin  40 mg Oral q1800  . sodium chloride  3 mL Intravenous Q12H  . vitamin B-12  100 mcg Oral BID  . DISCONTD: furosemide  20 mg Oral Daily   Continuous Infusions:  PRN Meds:.acetaminophen, acetaminophen, alum & mag hydroxide-simeth, loperamide, morphine, ondansetron (ZOFRAN) IV, ondansetron, oxyCODONE, senna-docusate, traMADol  Assessment/Plan:  1. Weakness generalized: suspect multifactorial in etiology, anemia, prostate cancer on XRT. No evidence of acute infection. Follow up urine cultures, no need for abx at this time  2. CP atypical transient resolved, d-dimer negative, cardiac enzymes x3 negative, FU echocardiogram, h/o CABG  3. Anemia, heme positive stools- seen by [facility] last hospitalization in December, recommended conservative mgt only. EGD-hiatal hernia, didn't have a colonoscopy, had CT enterography to evaluate colon- was unremarkable. Will stop coumadin. Check anemia panel, transfuse if Hb continue to trend down, Hb was in 8.1-8.4 range last admission  4. P.Atrial fibrillation: rate controlled, in NSR now, continue toprol/digoxin stop coumadin due to anemia/ persistent heme positive stools. CBC in AM 5. Systolic CHF, chronic, with AICD, currently compensated, resume PO lasix, continue BB, ace, FU echo  6. Prostate cancer on XRT   EDUCATION  - test results and diagnostic studies were discussed with patient  - patient verbalized the understanding  - questions were answered at the bedside and contact information was provided for additional questions or concerns   LOS: 3 days   MAGICK-[name] 05/17/2011, 9:00 PM  [facility] Pager: [phone]

## 2011-05-18 ENCOUNTER — Ambulatory Visit: Payer: PRIVATE HEALTH INSURANCE

## 2011-05-18 LAB — CBC
MCH: 30 pg (ref 26.0–34.0)
MCHC: 34.4 g/dL (ref 30.0–36.0)
Platelets: 113 10*3/uL — ABNORMAL LOW (ref 150–400)
RBC: 2.6 MIL/uL — ABNORMAL LOW (ref 4.22–5.81)

## 2011-05-18 LAB — BASIC METABOLIC PANEL
Calcium: 9 mg/dL (ref 8.4–10.5)
GFR calc Af Amer: >90 mL/min (ref >90)
GFR calc non Af Amer: 78 mL/min — ABNORMAL LOW (ref >90)
Sodium: 123 mEq/L — ABNORMAL LOW (ref 135–145)

## 2011-05-18 LAB — SODIUM, URINE, RANDOM: Sodium, Ur: 21 mEq/L

## 2011-05-18 LAB — PREPARE RBC (CROSSMATCH)

## 2011-05-18 LAB — GLUCOSE, CAPILLARY: Glucose-Capillary: 119 mg/dL — ABNORMAL HIGH (ref 70–99)

## 2011-05-18 LAB — ABO/RH: ABO/RH(D): B POS

## 2011-05-18 MED ORDER — SODIUM CHLORIDE 0.9 % IV BOLUS (SEPSIS)
500.0000 mL | Freq: Once | INTRAVENOUS | Status: AC
  Administered 2011-05-18: 500 mL via INTRAVENOUS

## 2011-05-18 NOTE — Progress Notes (Signed)
Nursing Note: Pt tolerated blood transfusion well. Pt has no complaints of signs of a reaction to blood transfusion. Will continue to monitor pt. Elizibeth Breau Scientist, clinical (histocompatibility and immunogenetics).

## 2011-05-18 NOTE — Progress Notes (Signed)
05/18/2011 [name] [facility] Case Management Note [phone]  Utilization review completed.  

## 2011-05-18 NOTE — Progress Notes (Signed)
Nursing Note: Second blood transfusion, pt is stable with no complaints at this time. No signs of a reaction to blood transfusion. Will continue to monitor pt. Marshal Eskew Scientist, clinical (histocompatibility and immunogenetics).

## 2011-05-18 NOTE — Progress Notes (Signed)
Nursing Note: Pt tolerated blood transfusion well. Pt lying in bed with no complaints at this time. No signs of a reaction to blood transfusion. Vitals stable see doc flow sheets. Will continue to monitor pt. Jaima Janney RN.

## 2011-05-18 NOTE — Progress Notes (Signed)
Patient ID: [name], male   DOB: 06/22/1932, 78 y.o.   MRN: [id number]  Subjective: No events overnight. Patient denies chest pain, shortness of breath, abdominal pain.   Objective:  Vital signs in last 24 hours:  Filed Vitals:   05/18/11 1705 05/18/11 1805 05/18/11 1900 05/18/11 2034  BP: 118/65 136/62 125/53 120/57  Pulse: 62 63 60 61  Temp: 98.1 F (36.7 C) 97.8 F (36.6 C) 98.3 F (36.8 C) 98.3 F (36.8 C)  TempSrc: Oral Oral Oral   Resp: 18 20 18 16   Weight:      SpO2:    97%    Intake/Output from previous day:   Intake/Output Summary (Last 24 hours) at 05/18/11 2146 Last data filed at 05/18/11 2039  Gross per 24 hour  Intake   1910 ml  Output   3200 ml  Net  -1290 ml    Physical Exam: General: Alert, awake, oriented x3, in no acute distress. HEENT: No bruits, no goiter. Moist mucous membranes, no scleral icterus, no conjunctival pallor. Heart: Regular rate and rhythm, S1/S2 +, no murmurs, rubs, gallops. Lungs: Clear to auscultation bilaterally. No wheezing, no rhonchi, no rales.  Abdomen: Soft, nontender, nondistended, positive bowel sounds. Extremities: No clubbing or cyanosis, no pitting edema,  positive pedal pulses. Neuro: Grossly nonfocal.  Lab Results:  Basic Metabolic Panel:    Component Value Date/Time   NA 123* 05/18/2011 0630   K 4.4 05/18/2011 0630   CL 89* 05/18/2011 0630   CO2 23 05/18/2011 0630   BUN 14 05/18/2011 0630   CREATININE 0.97 05/18/2011 0630   GLUCOSE 89 05/18/2011 0630   CALCIUM 9.0 05/18/2011 0630   CBC:    Component Value Date/Time   WBC 4.7 05/18/2011 0630   HGB 7.8* 05/18/2011 0630   HCT 22.7* 05/18/2011 0630   PLT 113* 05/18/2011 0630   MCV 87.3 05/18/2011 0630   NEUTROABS 4.2 03/24/2011 0755   LYMPHSABS 1.1 03/24/2011 0755   MONOABS 0.6 03/24/2011 0755   EOSABS 0.1 03/24/2011 0755   BASOSABS 0.0 03/24/2011 0755      Lab 05/18/11 0630 05/17/11 0512 05/16/11 0546 05/15/11 0550 05/14/11 1712  WBC 4.7 4.8 5.6 3.3* 4.6  HGB 7.8* 8.1*  8.5* 7.9* 8.6*  HCT 22.7* 24.2* 24.8* 23.9* 25.6*  PLT 113* 120* 141* 123* 146*  MCV 87.3 88.3 88.3 89.5 90.1  MCH 30.0 29.6 30.2 29.6 30.3  MCHC 34.4 33.5 34.3 33.1 33.6  RDW 18.9* 18.9* 19.0* 18.9* 18.8*  LYMPHSABS -- -- -- -- --  MONOABS -- -- -- -- --  EOSABS -- -- -- -- --  BASOSABS -- -- -- -- --  BANDABS -- -- -- -- --    Lab 05/18/11 0630 05/17/11 0512 05/16/11 0546 05/15/11 0550 05/14/11 2230 05/14/11 1712  NA 123* 127* 130* 136 -- 135  K 4.4 4.7 4.9 4.2 -- 4.4  CL 89* 94* 99 105 -- 104  CO2 23 24 19 20  -- 21  GLUCOSE 89 102* 102* 90 -- 119*  BUN 14 15 18 23  -- 29*  CREATININE 0.97 1.03 1.03 1.18 -- 1.29  CALCIUM 9.0 9.4 9.6 9.5 -- 9.8  MG -- -- -- -- 1.3* --    Lab 05/15/11 0550 05/14/11 2116 05/14/11 2008  INR 2.39* 2.19* 2.24*  PROTIME -- -- --   Cardiac markers:  Lab 05/15/11 1338 05/15/11 0550 05/14/11 2135  CKMB 2.7 2.6 2.4  TROPONINI <0.30 <0.30 <0.30  MYOGLOBIN -- -- --  Recent Results (from the past 240 hour(s))  MRSA PCR SCREENING     Status: Normal   Collection Time   05/14/11  9:59 PM      Component Value Range Status Comment   MRSA by PCR NEGATIVE  NEGATIVE  Final   URINE CULTURE     Status: Normal   Collection Time   05/14/11 11:42 PM      Component Value Range Status Comment   Specimen Description URINE, CLEAN CATCH   Final    Special Requests NONE   Final    Culture  Setup Time [id number]   Final    Colony Count NO GROWTH   Final    Culture NO GROWTH   Final    Report Status 05/16/2011 FINAL   Final   CLOSTRIDIUM DIFFICILE BY PCR     Status: Normal   Collection Time   05/18/11  3:42 AM      Component Value Range Status Comment   C difficile by pcr NEGATIVE  NEGATIVE  Final     Studies/Results: No results found.  Medications: Scheduled Meds:   . allopurinol  300 mg Oral Daily  . digoxin  125 mcg Oral Daily  . insulin aspart  0-9 Units Subcutaneous TID WC  . isosorbide mononitrate  30 mg Oral Daily  . lisinopril  10 mg Oral  Daily  . metoprolol succinate  25 mg Oral Daily  . omega-3 acid ethyl esters  1 g Oral BID  . pantoprazole  40 mg Oral Q1200  . phenazopyridine  200 mg Oral TID WC  . potassium chloride SA  20 mEq Oral BID  . simvastatin  40 mg Oral q1800  . sodium chloride  500 mL Intravenous Once  . sodium chloride  3 mL Intravenous Q12H  . vitamin B-12  100 mcg Oral BID   Continuous Infusions:  PRN Meds:.acetaminophen, acetaminophen, alum & mag hydroxide-simeth, loperamide, morphine, ondansetron (ZOFRAN) IV, ondansetron, oxyCODONE, traMADol  Assessment/Plan:  1. Weakness generalized: suspect multifactorial in etiology, anemia, prostate cancer on XRT. No evidence of acute infection. Follow up urine cultures, no need for abx at this time  2. CP atypical transient resolved, d-dimer negative, cardiac enzymes x3 negative, FU echocardiogram, h/o CABG  3. Anemia, heme positive stools- seen by [facility] last hospitalization in December, recommended conservative mgt only. EGD-hiatal hernia, didn't have a colonoscopy, had CT enterography to evaluate colon- was unremarkable. Will stop coumadin. Check anemia panel, transfuse if Hb continue to trend down, Hb was in 8.1-8.4 range last admission. 4. P.Atrial fibrillation: rate controlled, in NSR now, continue toprol/digoxin stop coumadin due to anemia/ persistent heme positive stools. CBC in AM  5. Systolic CHF, chronic, with AICD, currently compensated, resume PO lasix, continue BB, ace, FU echo  6. Prostate cancer on XRT    EDUCATION - test results and diagnostic studies were discussed with patient and pt's family (daughter) over the phone - patient and family have verbalized the understanding - questions were answered at the bedside and contact information was provided for additional questions or concerns   LOS: 4 days   MAGICK-Bitha Fauteux 05/18/2011, 9:46 PM  [facility] Pager: [phone]

## 2011-05-18 NOTE — Progress Notes (Signed)
Nursing Note: Order obtained, consent completed. Blood transfusion started per MD's order. Pt is stable with normal vitals. Pt has no complaints at this time.Will continue to monitor pt. Anavictoria Wilk Scientist, clinical (histocompatibility and immunogenetics).

## 2011-05-18 NOTE — Progress Notes (Signed)
Nursing Note: Pt has received blood for first 15 minutes. Pt has no complaints or has not shown signs of a reaction. Pt is lying in bed. Vital signs within normal limits. Will continue to monitor pt. Bethan Adamek Scientist, clinical (histocompatibility and immunogenetics).

## 2011-05-19 LAB — OSMOLALITY, URINE: Osmolality, Ur: 147 mosm/kg — ABNORMAL LOW (ref 390–1090)

## 2011-05-19 LAB — TYPE AND SCREEN: Unit division: 0

## 2011-05-19 LAB — GLUCOSE, CAPILLARY
Glucose-Capillary: 90 mg/dL (ref 70–99)
Glucose-Capillary: 120 mg/dL — ABNORMAL HIGH (ref 70–99)

## 2011-05-19 LAB — BASIC METABOLIC PANEL WITH GFR
BUN: 17 mg/dL (ref 6–23)
CO2: 22 meq/L (ref 19–32)
Calcium: 9 mg/dL (ref 8.4–10.5)
Chloride: 94 meq/L — ABNORMAL LOW (ref 96–112)
Creatinine, Ser: 1 mg/dL (ref 0.50–1.35)
GFR calc Af Amer: 82 mL/min — ABNORMAL LOW
GFR calc non Af Amer: 71 mL/min — ABNORMAL LOW
Glucose, Bld: 91 mg/dL (ref 70–99)
Potassium: 4 meq/L (ref 3.5–5.1)
Sodium: 129 meq/L — ABNORMAL LOW (ref 135–145)

## 2011-05-19 LAB — CBC
Hemoglobin: 9.8 g/dL — ABNORMAL LOW (ref 13.0–17.0)
MCHC: 35.1 g/dL (ref 30.0–36.0)
MCHC: 34.9 g/dL (ref 30.0–36.0)
MCV: 85 fL (ref 78.0–100.0)
Platelets: 91 10*3/uL — ABNORMAL LOW (ref 150–400)
Platelets: 104 10*3/uL — ABNORMAL LOW (ref 150–400)
RDW: 17.2 % — ABNORMAL HIGH (ref 11.5–15.5)
WBC: 4.4 10*3/uL (ref 4.0–10.5)

## 2011-05-19 MED ORDER — SODIUM CHLORIDE 0.9 % IV BOLUS (SEPSIS)
500.0000 mL | Freq: Once | INTRAVENOUS | Status: AC
  Administered 2011-05-19: 500 mL via INTRAVENOUS

## 2011-05-19 NOTE — Progress Notes (Signed)
Patient ID: [name], male   DOB: 1932/04/04, 78 y.o.   MRN: [id number]  Subjective: No events overnight. Patient denies chest pain, shortness of breath, abdominal pain. Had bowel movement and reports ambulating.  Objective:  Vital signs in last 24 hours:  Filed Vitals:   05/18/11 1805 05/18/11 1900 05/18/11 2034 05/19/11 0535  BP: 136/62 125/53 120/57 105/63  Pulse: 63 60 61 59  Temp: 97.8 F (36.6 C) 98.3 F (36.8 C) 98.3 F (36.8 C) 97.9 F (36.6 C)  TempSrc: Oral Oral    Resp: 20 18 16 16   Weight:    62.9 kg (138 lb 10.7 oz)  SpO2:   97% 97%    Intake/Output from previous day:   Intake/Output Summary (Last 24 hours) at 05/19/11 0843 Last data filed at 05/18/11 2304  Gross per 24 hour  Intake   1550 ml  Output   2800 ml  Net  -1250 ml    Physical Exam: General: Alert, awake, oriented x3, in no acute distress. HEENT: No bruits, no goiter. Moist mucous membranes, no scleral icterus, no conjunctival pallor. Heart: Regular rate and rhythm, S1/S2 +, no murmurs, rubs, gallops. Lungs: Clear to auscultation bilaterally. No wheezing, no rhonchi, no rales.  Abdomen: Soft, nontender, nondistended, positive bowel sounds. Extremities: No clubbing or cyanosis, no pitting edema,  positive pedal pulses. Neuro: Grossly nonfocal.  Lab Results:  Basic Metabolic Panel:    Component Value Date/Time   NA 129* 05/19/2011 0530   K 4.0 05/19/2011 0530   CL 94* 05/19/2011 0530   CO2 22 05/19/2011 0530   BUN 17 05/19/2011 0530   CREATININE 1.00 05/19/2011 0530   GLUCOSE 91 05/19/2011 0530   CALCIUM 9.0 05/19/2011 0530   CBC:    Component Value Date/Time   WBC 5.1 05/19/2011 0530   HGB 9.8* 05/19/2011 0530   HCT 28.1* 05/19/2011 0530   PLT 104* 05/19/2011 0530   MCV 84.6 05/19/2011 0530   NEUTROABS 4.2 03/24/2011 0755   LYMPHSABS 1.1 03/24/2011 0755   MONOABS 0.6 03/24/2011 0755   EOSABS 0.1 03/24/2011 0755   BASOSABS 0.0 03/24/2011 0755      Lab 05/19/11 0530 05/18/11 2134 05/18/11 0630  05/17/11 0512 05/16/11 0546  WBC 5.1 4.4 4.7 4.8 5.6  HGB 9.8* 9.5* 7.8* 8.1* 8.5*  HCT 28.1* 27.1* 22.7* 24.2* 24.8*  PLT 104* 91* 113* 120* 141*  MCV 84.6 85.0 87.3 88.3 88.3  MCH 29.5 29.8 30.0 29.6 30.2  MCHC 34.9 35.1 34.4 33.5 34.3  RDW 17.5* 17.2* 18.9* 18.9* 19.0*  LYMPHSABS -- -- -- -- --  MONOABS -- -- -- -- --  EOSABS -- -- -- -- --  BASOSABS -- -- -- -- --  BANDABS -- -- -- -- --    Lab 05/19/11 0530 05/18/11 0630 05/17/11 0512 05/16/11 0546 05/15/11 0550 05/14/11 2230  NA 129* 123* 127* 130* 136 --  K 4.0 4.4 4.7 4.9 4.2 --  CL 94* 89* 94* 99 105 --  CO2 22 23 24 19 20  --  GLUCOSE 91 89 102* 102* 90 --  BUN 17 14 15 18 23  --  CREATININE 1.00 0.97 1.03 1.03 1.18 --  CALCIUM 9.0 9.0 9.4 9.6 9.5 --  MG -- -- -- -- -- 1.3*    Lab 05/15/11 0550 05/14/11 2116 05/14/11 2008  INR 2.39* 2.19* 2.24*  PROTIME -- -- --   Cardiac markers:  Lab 05/15/11 1338 05/15/11 0550 05/14/11 2135  CKMB 2.7 2.6 2.4  TROPONINI <  0.30 <0.30 <0.30  MYOGLOBIN -- -- --   No components found with this basename: POCBNP:3 Recent Results (from the past 240 hour(s))  MRSA PCR SCREENING     Status: Normal   Collection Time   05/14/11  9:59 PM      Component Value Range Status Comment   MRSA by PCR NEGATIVE  NEGATIVE  Final   URINE CULTURE     Status: Normal   Collection Time   05/14/11 11:42 PM      Component Value Range Status Comment   Specimen Description URINE, CLEAN CATCH   Final    Special Requests NONE   Final    Culture  Setup Time [id number]   Final    Colony Count NO GROWTH   Final    Culture NO GROWTH   Final    Report Status 05/16/2011 FINAL   Final   CLOSTRIDIUM DIFFICILE BY PCR     Status: Normal   Collection Time   05/18/11  3:42 AM      Component Value Range Status Comment   C difficile by pcr NEGATIVE  NEGATIVE  Final     Studies/Results: No results found.  Medications: Scheduled Meds:   . allopurinol  300 mg Oral Daily  . digoxin  125 mcg Oral Daily  .  insulin aspart  0-9 Units Subcutaneous TID WC  . isosorbide mononitrate  30 mg Oral Daily  . lisinopril  10 mg Oral Daily  . metoprolol succinate  25 mg Oral Daily  . omega-3 acid ethyl esters  1 g Oral BID  . pantoprazole  40 mg Oral Q1200  . phenazopyridine  200 mg Oral TID WC  . potassium chloride SA  20 mEq Oral BID  . simvastatin  40 mg Oral q1800  . sodium chloride  500 mL Intravenous Once  . sodium chloride  3 mL Intravenous Q12H  . vitamin B-12  100 mcg Oral BID   Continuous Infusions:  PRN Meds:.acetaminophen, acetaminophen, alum & mag hydroxide-simeth, loperamide, morphine, ondansetron (ZOFRAN) IV, ondansetron, oxyCODONE, traMADol  Assessment/Plan:  Principal Problem:  *Weakness generalized - this was thought to be multifactorial in nature, secondary to progressive deconditioning, hyponatremia, dehydration - physical therapy consult obtained and no recommendations at this time for physical therapy indicated upon discharge - pt was ambulating in the hospital and had no difficulty with ambulating  Active Problems:  Atrial fibrillation - this is chronic problem for the pt but throughout the hospitalization the pt has been in normal sinus rhythm - rate remained within normal limits   Systolic CHF, chronic - last 2 D ECHO indicated systolic CHF with EF 25% - this has remained clinically stable but because pt was clinically dehydrated Lasix was held and small boluses of IVF were given - pt has responded well and has maintained oxygen saturations > 95% on 2 L nasal canula   Prostate cancer - this has been followed in an outpatient setting with pt's primary oncologist   Hyponatremia - likely secondary to dehydration - small boluses of IVF given and pt has responded well - Na level trending up - will obtain BMP in AM    Anemia of chronic disease - with baseline Hg 8-9 - pt denied blood in stool and it was determined by family and pt that he will not want to have  colonoscopy for further evaluation - pt has required 2 units of PRBC transfused for symptomatic anemia  - pt has responded well   EDUCATION -  test results and diagnostic studies were discussed with patient - patient verbalized the understanding - questions were answered at the bedside and contact information was provided for additional questions or concerns   LOS: 5 days   MAGICK-Ohana Birdwell 05/19/2011, 8:43 AM  [facility] Pager: [phone]

## 2011-05-20 LAB — CBC
HCT: 29.1 % — ABNORMAL LOW (ref 39.0–52.0)
MCH: 29.6 pg (ref 26.0–34.0)
MCHC: 34.4 g/dL (ref 30.0–36.0)
MCV: 86.1 fL (ref 78.0–100.0)
RDW: 17.6 % — ABNORMAL HIGH (ref 11.5–15.5)

## 2011-05-20 LAB — BASIC METABOLIC PANEL
BUN: 13 mg/dL (ref 6–23)
Calcium: 9.1 mg/dL (ref 8.4–10.5)
Creatinine, Ser: 1.07 mg/dL (ref 0.50–1.35)
GFR calc Af Amer: 76 mL/min — ABNORMAL LOW (ref >90)
GFR calc non Af Amer: 65 mL/min — ABNORMAL LOW (ref >90)
Glucose, Bld: 99 mg/dL (ref 70–99)

## 2011-05-20 LAB — GLUCOSE, CAPILLARY: Glucose-Capillary: 98 mg/dL (ref 70–99)

## 2011-05-20 MED ORDER — CIPROFLOXACIN HCL 500 MG PO TABS
500.0000 mg | ORAL_TABLET | Freq: Two times a day (BID) | ORAL | Status: DC
  Administered 2011-05-20 – 2011-05-21 (×3): 500 mg via ORAL
  Filled 2011-05-20 (×5): qty 1

## 2011-05-20 NOTE — Progress Notes (Signed)
Patient ID: [name], male   DOB: 01-10-1933, 78 y.o.   MRN: [id number]  Subjective: No events overnight. Patient denies chest pain, shortness of breath, abdominal pain. Had bowel movement and reports ambulating.  Objective:  Vital signs in last 24 hours:  Filed Vitals:   05/20/11 0348 05/20/11 0534 05/20/11 1058 05/20/11 1059  BP:  129/67 116/67   Pulse:  61 62 62  Temp:  98.1 F (36.7 C)    TempSrc:  Oral    Resp:  20    Height: 5\' 6"  (1.676 m)     Weight:  63 kg (138 lb 14.2 oz)    SpO2:  98%      Intake/Output from previous day:   Intake/Output Summary (Last 24 hours) at 05/20/11 1220 Last data filed at 05/20/11 1158  Gross per 24 hour  Intake   1200 ml  Output   3201 ml  Net  -2001 ml    Physical Exam: General: Alert, awake, oriented x3, in no acute distress. HEENT: No bruits, no goiter. Moist mucous membranes, no scleral icterus, no conjunctival pallor. Heart: Regular rate and rhythm, S1/S2 +, no murmurs, rubs, gallops. Lungs: Clear to auscultation bilaterally. No wheezing, no rhonchi, no rales.  Abdomen: Soft, nontender, nondistended, positive bowel sounds. Extremities: No clubbing or cyanosis, no pitting edema,  positive pedal pulses. Neuro: Grossly nonfocal.  Lab Results:  Basic Metabolic Panel:    Component Value Date/Time   NA 130* 05/20/2011 0455   K 4.3 05/20/2011 0455   CL 97 05/20/2011 0455   CO2 23 05/20/2011 0455   BUN 13 05/20/2011 0455   CREATININE 1.07 05/20/2011 0455   GLUCOSE 99 05/20/2011 0455   CALCIUM 9.1 05/20/2011 0455   CBC:    Component Value Date/Time   WBC 5.1 05/20/2011 0455   HGB 10.0* 05/20/2011 0455   HCT 29.1* 05/20/2011 0455   PLT 109* 05/20/2011 0455   MCV 86.1 05/20/2011 0455   NEUTROABS 4.2 03/24/2011 0755   LYMPHSABS 1.1 03/24/2011 0755   MONOABS 0.6 03/24/2011 0755   EOSABS 0.1 03/24/2011 0755   BASOSABS 0.0 03/24/2011 0755      Lab 05/20/11 0455 05/19/11 0530 05/18/11 2134 05/18/11 0630 05/17/11 0512  WBC 5.1 5.1  4.4 4.7 4.8  HGB 10.0* 9.8* 9.5* 7.8* 8.1*  HCT 29.1* 28.1* 27.1* 22.7* 24.2*  PLT 109* 104* 91* 113* 120*  MCV 86.1 84.6 85.0 87.3 88.3  MCH 29.6 29.5 29.8 30.0 29.6  MCHC 34.4 34.9 35.1 34.4 33.5  RDW 17.6* 17.5* 17.2* 18.9* 18.9*  LYMPHSABS -- -- -- -- --  MONOABS -- -- -- -- --  EOSABS -- -- -- -- --  BASOSABS -- -- -- -- --  BANDABS -- -- -- -- --    Lab 05/20/11 0455 05/19/11 0530 05/18/11 0630 05/17/11 0512 05/16/11 0546 05/14/11 2230  NA 130* 129* 123* 127* 130* --  K 4.3 4.0 4.4 4.7 4.9 --  CL 97 94* 89* 94* 99 --  CO2 23 22 23 24 19  --  GLUCOSE 99 91 89 102* 102* --  BUN 13 17 14 15 18  --  CREATININE 1.07 1.00 0.97 1.03 1.03 --  CALCIUM 9.1 9.0 9.0 9.4 9.6 --  MG -- -- -- -- -- 1.3*    Lab 05/15/11 0550 05/14/11 2116 05/14/11 2008  INR 2.39* 2.19* 2.24*  PROTIME -- -- --   Cardiac markers:  Lab 05/15/11 1338 05/15/11 0550 05/14/11 2135  CKMB 2.7 2.6 2.4  TROPONINI <  0.30 <0.30 <0.30  MYOGLOBIN -- -- --   No components found with this basename: POCBNP:3 Recent Results (from the past 240 hour(s))  MRSA PCR SCREENING     Status: Normal   Collection Time   05/14/11  9:59 PM      Component Value Range Status Comment   MRSA by PCR NEGATIVE  NEGATIVE  Final   URINE CULTURE     Status: Normal   Collection Time   05/14/11 11:42 PM      Component Value Range Status Comment   Specimen Description URINE, CLEAN CATCH   Final    Special Requests NONE   Final    Culture  Setup Time [id number]   Final    Colony Count NO GROWTH   Final    Culture NO GROWTH   Final    Report Status 05/16/2011 FINAL   Final   CLOSTRIDIUM DIFFICILE BY PCR     Status: Normal   Collection Time   05/18/11  3:42 AM      Component Value Range Status Comment   C difficile by pcr NEGATIVE  NEGATIVE  Final     Studies/Results: No results found.  Medications: Scheduled Meds:   . allopurinol  300 mg Oral Daily  . ciprofloxacin  500 mg Oral BID  . digoxin  125 mcg Oral Daily  . insulin  aspart  0-9 Units Subcutaneous TID WC  . isosorbide mononitrate  30 mg Oral Daily  . lisinopril  10 mg Oral Daily  . metoprolol succinate  25 mg Oral Daily  . omega-3 acid ethyl esters  1 g Oral BID  . pantoprazole  40 mg Oral Q1200  . phenazopyridine  200 mg Oral TID WC  . potassium chloride SA  20 mEq Oral BID  . simvastatin  40 mg Oral q1800  . sodium chloride  3 mL Intravenous Q12H  . vitamin B-12  100 mcg Oral BID   Continuous Infusions:  PRN Meds:.acetaminophen, acetaminophen, alum & mag hydroxide-simeth, loperamide, morphine, ondansetron (ZOFRAN) IV, ondansetron, oxyCODONE, traMADol  Assessment/Plan:   Principal Problem:  *Weakness generalized  - this was thought to be multifactorial in nature, secondary to progressive deconditioning, hyponatremia, dehydration  - physical therapy consult obtained and no recommendations at this time for physical therapy indicated upon discharge  - pt was ambulating in the hospital and had no difficulty with ambulating   Active Problems:  Atrial fibrillation  - this is chronic problem for the pt but throughout the hospitalization the pt has been in normal sinus rhythm  - rate remained within normal limits   Systolic CHF, chronic  - last 2 D ECHO indicated systolic CHF with EF 25%  - this has remained clinically stable but because pt was clinically dehydrated Lasix was held and small boluses of IVF were given  - pt has responded well and has maintained oxygen saturations > 95% on 2 L nasal canula   Prostate cancer  - this has been followed in an outpatient setting with pt's primary oncologist   Hyponatremia  - likely secondary to dehydration  - small boluses of IVF given and pt has responded well  - Na level trending up  - will obtain BMP in AM   Anemia of chronic disease  - with baseline Hg 8-9  - pt denied blood in stool and it was determined by family and pt that he will not want to have colonoscopy for further evaluation  - pt  has required 2  units of PRBC transfused for symptomatic anemia  - pt has responded well   EDUCATION  - test results and diagnostic studies were discussed with patient  - patient verbalized the understanding  - questions were answered at the bedside and contact information was provided for additional questions or concerns   LOS: 6 days   MAGICK-Karla Pavone 05/20/2011, 12:20 PM  [facility] Pager: [phone]

## 2011-05-21 ENCOUNTER — Ambulatory Visit: Payer: PRIVATE HEALTH INSURANCE

## 2011-05-21 LAB — BASIC METABOLIC PANEL
BUN: 13 mg/dL (ref 6–23)
Creatinine, Ser: 1.04 mg/dL (ref 0.50–1.35)
GFR calc Af Amer: 78 mL/min — ABNORMAL LOW (ref >90)
GFR calc non Af Amer: 68 mL/min — ABNORMAL LOW (ref >90)
Potassium: 4.4 mEq/L (ref 3.5–5.1)

## 2011-05-21 LAB — CBC
HCT: 30.8 % — ABNORMAL LOW (ref 39.0–52.0)
MCHC: 34.1 g/dL (ref 30.0–36.0)
Platelets: 114 10*3/uL — ABNORMAL LOW (ref 150–400)
RDW: 17.6 % — ABNORMAL HIGH (ref 11.5–15.5)

## 2011-05-21 LAB — GLUCOSE, CAPILLARY: Glucose-Capillary: 96 mg/dL (ref 70–99)

## 2011-05-21 MED ORDER — OXYCODONE HCL 5 MG PO TABS: 5.0000 mg | ORAL_TABLET | ORAL | Status: AC | PRN

## 2011-05-21 NOTE — Discharge Summary (Signed)
Patient ID: [name] MRN: [id number] DOB/AGE: 1932-05-20 78 y.o.  Admit date: 05/14/2011 Discharge date: 05/21/2011  Primary Care Physician:  Pearson Grippe, MD, MD  Discharge Diagnoses:    Present on Admission:  .Weakness generalized .Chest pain .Atrial fibrillation .AUTOMATIC IMPLANTABLE CARDIAC DEFIBRILLATOR SITU .Systolic CHF, chronic .Prostate cancer, recur risk not determined whether low, med or high .Prostate cancer .Pacemaker  Principal Problem:  *Weakness generalized Active Problems:  Atrial fibrillation  AUTOMATIC IMPLANTABLE CARDIAC DEFIBRILLATOR SITU  Systolic CHF, chronic  Prostate cancer, recur risk not determined whether low, med or high  Prostate cancer  Pacemaker  S/P CABG (coronary artery bypass graft)  On home oxygen therapy  Chest pain   Medication List  As of 05/21/2011 11:22 AM   TAKE these medications         allopurinol 300 MG tablet   Commonly known as: ZYLOPRIM   Take 300 mg by mouth daily.      atorvastatin 20 MG tablet   Commonly known as: LIPITOR   Take 20 mg by mouth daily.      CALCIUM-VITAMIN D PO   Take 1 tablet by mouth 2 (two) times daily.      digoxin 0.125 MG tablet   Commonly known as: LANOXIN   Take 125 mcg by mouth daily.      fish oil-omega-3 fatty acids 1000 MG capsule   Take 1 g by mouth 2 (two) times daily.      furosemide 20 MG tablet   Commonly known as: LASIX   Take 20 mg by mouth every morning.      ibuprofen 200 MG tablet   Commonly known as: ADVIL,MOTRIN   Take 200 mg by mouth at bedtime.      isosorbide mononitrate 30 MG 24 hr tablet   Commonly known as: IMDUR   Take 30 mg by mouth daily.      lisinopril 10 MG tablet   Commonly known as: PRINIVIL,ZESTRIL   Take 10 mg by mouth every morning.      loperamide 2 MG capsule   Commonly known as: IMODIUM   Take 2 mg by mouth 4 (four) times daily as needed. For diarrhea      metoprolol succinate 25 MG 24 hr tablet   Commonly known as: TOPROL-XL   Take 25 mg by mouth every morning.      omeprazole 20 MG capsule   Commonly known as: PRILOSEC   Take 20 mg by mouth every morning.      oxyCODONE 5 MG immediate release tablet   Commonly known as: Oxy IR/ROXICODONE   Take 1 tablet (5 mg total) by mouth every 4 (four) hours as needed.      phenazopyridine 200 MG tablet   Commonly known as: PYRIDIUM   Take 200 mg by mouth 3 (three) times daily as needed. For burning associated with UTI      potassium chloride SA 20 MEQ tablet   Commonly known as: K-DUR,KLOR-CON   Take 20 mEq by mouth 2 (two) times daily.      traMADol 50 MG tablet   Commonly known as: ULTRAM   Take 50 mg by mouth every 6 (six) hours as needed. For pain.      VITAMIN B-12 PO   Take 1 tablet by mouth 2 (two) times daily.      warfarin 2.5 MG tablet   Commonly known as: COUMADIN   Take 1.25-2.5 mg by mouth every evening. Take 1/2 tablet on Mon &  Fri & 1 tablet on all other days            Disposition and Follow-up: Pt will need to see PCP in 2 weeks after discharge. Please note that pt will need to have CBC checked to ensure that Hg/Hct remain stable and at pt's baseline 8-9. He will also need to have BMP obtained to ensure that sodium level is stable and at his baseline. Please also note that hyponatremia was thought to be secondary to dehydration rather than SIADH.   Consults: none  Significant Diagnostic Studies:  Dg Chest 2 View 05/14/2011  IMPRESSION:  1.  No active cardiopulmonary abnormalities.     Brief H and P: 78 year old Caucasian gentleman with history of diabetes, hypertension, CHF, CVA, MI, status post permanent pacemaker, status post bypass surgery, history of prostate cancer status post gold seeds and receiving radiation therapy, hyperlipidemia nephrolithiasis, on chronic Coumadin therapy, who presents to the ED with a one-day history of weakness and fatigue, and chest pain. Patient states that he's been feeling weak and fatigued x1 day and his  daughter felt he was pale. Patient also complained of left shoulder pain that has been ongoing for months. Patient states that on the day of admission he had some left substernal chest pain, nonradiating in nature, occurred at rest, and lasted approximately 6 minutes. Patient denied any shortness of breath, no paroxysmal nocturnal dyspnea, no orthopnea. Patient does endorse palpitations which he states is chronic. Patient denies any fevers, no chills, no cough, no nausea, no vomiting, no lower extremity edema, no abdominal pain, no diarrhea, no constipation.   Physical Exam on Discharge:  Filed Vitals:   05/20/11 1200 05/20/11 1346 05/20/11 2145 05/21/11 0433  BP:  109/52 129/72 131/78  Pulse:  61 60 59  Temp: 97.4 F (36.3 C) 97 F (36.1 C) 97.5 F (36.4 C) 97.7 F (36.5 C)  TempSrc:  Oral Oral Oral  Resp:  18 17 18   Height:      Weight:    62.6 kg (138 lb 0.1 oz)  SpO2: 98% 98% 97% 96%     Intake/Output Summary (Last 24 hours) at 05/21/11 1122 Last data filed at 05/21/11 1103  Gross per 24 hour  Intake   1440 ml  Output   3650 ml  Net  -2210 ml    General: Alert, awake, oriented x3, in no acute distress. HEENT: No bruits, no goiter. Heart: Regular rate and rhythm, without murmurs, rubs, gallops. Lungs: Clear to auscultation bilaterally. Abdomen: Soft, nontender, nondistended, positive bowel sounds. Extremities: No clubbing cyanosis or edema with positive pedal pulses. Neuro: Grossly intact, nonfocal.  CBC:    Component Value Date/Time   WBC 4.8 05/21/2011 0524   HGB 10.5* 05/21/2011 0524   HCT 30.8* 05/21/2011 0524   PLT 114* 05/21/2011 0524   MCV 86.8 05/21/2011 0524   NEUTROABS 4.2 03/24/2011 0755   LYMPHSABS 1.1 03/24/2011 0755   MONOABS 0.6 03/24/2011 0755   EOSABS 0.1 03/24/2011 0755   BASOSABS 0.0 03/24/2011 0755    Basic Metabolic Panel:    Component Value Date/Time   NA 131* 05/21/2011 0524   K 4.4 05/21/2011 0524   CL 95* 05/21/2011 0524   CO2 23 05/21/2011 0524    BUN 13 05/21/2011 0524   CREATININE 1.04 05/21/2011 0524   GLUCOSE 91 05/21/2011 0524   CALCIUM 9.6 05/21/2011 0524    Hospital Course:  Principal Problem:  *Weakness generalized - most likely multifactorial in nature and secondary to progressive  deconditioning, hyponatremia, dehydration from blood loss - we have provided IVF and held Lasix temporarily, and we have also allowed for PT evaluation - in addition we have transfused 2 units PRBCs and pt has symptomatically improved - pt was able to ambulate with no assistance and was able to carry out ADL with no difficulties - pt has been maintaining oxygen saturations > 95% on RA  Active Problems:  Atrial fibrillation - this has been stable during the hospitalization - rate has been controlled = pt will continue to take coumadin as indicated above   Systolic CHF, chronic - Lasix was held temporarily due to dehydration - he has remained clinically stable during the stay and will be able to resume Lasix upon discharge   Prostate cancer - managed by primary oncologist in an outpatient setting   Pacemaker   S/P CABG (coronary artery bypass graft)   On home oxygen therapy   DISPOSITION - plan of care and diagnosis, diagnostic studies and test results were discussed with pt and family at bedside - pt and  family verbalized understanding  Time spent on Discharge: Over 30 minutes  Signed: Debbora Presto 05/21/2011, 11:22 AM  [facility] [phone]

## 2011-05-21 NOTE — Progress Notes (Signed)
Pt's daughter signed copy of d/c instructions as requested by pt.  Declined w/c or staff service upon d/c with 02.  Pt and daughter stated "it's ok and I feel I need to walk anyway."  Pt in no acute distress/SOB upon d/c.  [name] Pea, Charity fundraiser.

## 2011-05-21 NOTE — Discharge Instructions (Signed)
Anemia, Nonspecific Your exam and blood tests show you are anemic. This means your blood (hemoglobin) level is low. Normal hemoglobin values are 12 to 15 g/dL for females and 14 to 17 g/dL for males. Make a note of your hemoglobin level today. The hematocrit percent is also used to measure anemia. A normal hematocrit is 38% to 46% in females and 42% to 49% in males. Make a note of your hematocrit level today. CAUSES  Anemia can be due to many different causes.  Excessive bleeding from periods (in women).   Intestinal bleeding.   Poor nutrition.   Kidney, thyroid, liver, and bone marrow diseases.  SYMPTOMS  Anemia can come on suddenly (acute). It can also come on slowly. Symptoms can include:  Minor weakness.   Dizziness.   Palpitations.   Shortness of breath.  Symptoms may be absent until half your hemoglobin is missing if it comes on slowly. Anemia due to acute blood loss from an injury or internal bleeding may require blood transfusion if the loss is severe. Hospital care is needed if you are anemic and there is significant continual blood loss. TREATMENT   Stool tests for blood (Hemoccult) and additional lab tests are often needed. This determines the best treatment.   Further checking on your condition and your response to treatment is very important. It often takes many weeks to correct anemia.  Depending on the cause, treatment can include:  Supplements of iron.   Vitamins B12 and folic acid.   Hormone medicines.If your anemia is due to bleeding, finding the cause of the blood loss is very important. This will help avoid further problems.  SEEK IMMEDIATE MEDICAL CARE IF:   You develop fainting, extreme weakness, shortness of breath, or chest pain.   You develop heavy vaginal bleeding.   You develop bloody or black, tarry stools or vomit up blood.   You develop a high fever, rash, repeated vomiting, or dehydration.  Document Released: 04/05/2004 Document Revised:  02/15/2011 Document Reviewed: 01/11/2009 [employer] Patient Information 2012 [employer]. 

## 2011-05-21 NOTE — Progress Notes (Signed)
All d/c instructions explained as given.  Pt verbalized understanding.  Waiting for ride.  [name] Pea, RN

## 2011-05-22 ENCOUNTER — Ambulatory Visit: Payer: PRIVATE HEALTH INSURANCE

## 2011-05-23 ENCOUNTER — Telehealth: Payer: Self-pay | Admitting: Radiation Oncology

## 2011-05-23 ENCOUNTER — Encounter (HOSPITAL_COMMUNITY): Payer: Self-pay

## 2011-05-23 ENCOUNTER — Other Ambulatory Visit: Payer: Self-pay

## 2011-05-23 ENCOUNTER — Ambulatory Visit: Payer: PRIVATE HEALTH INSURANCE

## 2011-05-23 ENCOUNTER — Emergency Department (HOSPITAL_COMMUNITY)
Admission: EM | Admit: 2011-05-23 | Discharge: 2011-05-23 | Disposition: A | Discharge status: Home / Self Care | Payer: PRIVATE HEALTH INSURANCE | Attending: Emergency Medicine | Admitting: Emergency Medicine

## 2011-05-23 DIAGNOSIS — R112 Nausea with vomiting, unspecified: Secondary | ICD-10-CM | POA: Insufficient documentation

## 2011-05-23 DIAGNOSIS — Z8546 Personal history of malignant neoplasm of prostate: Secondary | ICD-10-CM | POA: Insufficient documentation

## 2011-05-23 DIAGNOSIS — I252 Old myocardial infarction: Secondary | ICD-10-CM | POA: Insufficient documentation

## 2011-05-23 DIAGNOSIS — Z79899 Other long term (current) drug therapy: Secondary | ICD-10-CM | POA: Insufficient documentation

## 2011-05-23 DIAGNOSIS — J449 Chronic obstructive pulmonary disease, unspecified: Secondary | ICD-10-CM | POA: Insufficient documentation

## 2011-05-23 DIAGNOSIS — E119 Type 2 diabetes mellitus without complications: Secondary | ICD-10-CM | POA: Insufficient documentation

## 2011-05-23 DIAGNOSIS — Z8673 Personal history of transient ischemic attack (TIA), and cerebral infarction without residual deficits: Secondary | ICD-10-CM | POA: Insufficient documentation

## 2011-05-23 DIAGNOSIS — Z7901 Long term (current) use of anticoagulants: Secondary | ICD-10-CM | POA: Insufficient documentation

## 2011-05-23 DIAGNOSIS — I10 Essential (primary) hypertension: Secondary | ICD-10-CM | POA: Insufficient documentation

## 2011-05-23 DIAGNOSIS — Z8639 Personal history of other endocrine, nutritional and metabolic disease: Secondary | ICD-10-CM | POA: Insufficient documentation

## 2011-05-23 DIAGNOSIS — Z87442 Personal history of urinary calculi: Secondary | ICD-10-CM | POA: Insufficient documentation

## 2011-05-23 DIAGNOSIS — Z862 Personal history of diseases of the blood and blood-forming organs and certain disorders involving the immune mechanism: Secondary | ICD-10-CM | POA: Insufficient documentation

## 2011-05-23 DIAGNOSIS — I2510 Atherosclerotic heart disease of native coronary artery without angina pectoris: Secondary | ICD-10-CM | POA: Insufficient documentation

## 2011-05-23 DIAGNOSIS — K219 Gastro-esophageal reflux disease without esophagitis: Secondary | ICD-10-CM | POA: Insufficient documentation

## 2011-05-23 DIAGNOSIS — M129 Arthropathy, unspecified: Secondary | ICD-10-CM | POA: Insufficient documentation

## 2011-05-23 DIAGNOSIS — Z951 Presence of aortocoronary bypass graft: Secondary | ICD-10-CM | POA: Insufficient documentation

## 2011-05-23 DIAGNOSIS — E7800 Pure hypercholesterolemia, unspecified: Secondary | ICD-10-CM | POA: Insufficient documentation

## 2011-05-23 DIAGNOSIS — Z95810 Presence of automatic (implantable) cardiac defibrillator: Secondary | ICD-10-CM | POA: Insufficient documentation

## 2011-05-23 DIAGNOSIS — I509 Heart failure, unspecified: Secondary | ICD-10-CM | POA: Insufficient documentation

## 2011-05-23 DIAGNOSIS — J4489 Other specified chronic obstructive pulmonary disease: Secondary | ICD-10-CM | POA: Insufficient documentation

## 2011-05-23 LAB — DIFFERENTIAL
Basophils Absolute: 0 10*3/uL (ref 0.0–0.1)
Basophils Relative: 0 % (ref 0–1)
Eosinophils Absolute: 0.2 10*3/uL (ref 0.0–0.7)
Eosinophils Relative: 4 % (ref 0–5)
Lymphocytes Relative: 7 % — ABNORMAL LOW (ref 12–46)
Lymphs Abs: 0.3 10*3/uL — ABNORMAL LOW (ref 0.7–4.0)
Monocytes Absolute: 0.6 10*3/uL (ref 0.1–1.0)
Monocytes Relative: 12 % (ref 3–12)
Neutro Abs: 3.9 10*3/uL (ref 1.7–7.7)
Neutrophils Relative %: 78 % — ABNORMAL HIGH (ref 43–77)

## 2011-05-23 LAB — COMPREHENSIVE METABOLIC PANEL
ALT: 33 U/L (ref 0–53)
AST: 24 U/L (ref 0–37)
Albumin: 3.3 g/dL — ABNORMAL LOW (ref 3.5–5.2)
Alkaline Phosphatase: 85 U/L (ref 39–117)
BUN: 14 mg/dL (ref 6–23)
CO2: 24 mEq/L (ref 19–32)
Calcium: 9.3 mg/dL (ref 8.4–10.5)
Chloride: 98 mEq/L (ref 96–112)
Creatinine, Ser: 1.12 mg/dL (ref 0.50–1.35)
GFR calc Af Amer: 72 mL/min — ABNORMAL LOW (ref >90)
GFR calc non Af Amer: 62 mL/min — ABNORMAL LOW (ref >90)
Glucose, Bld: 104 mg/dL — ABNORMAL HIGH (ref 70–99)
Potassium: 4 mEq/L (ref 3.5–5.1)
Sodium: 132 mEq/L — ABNORMAL LOW (ref 135–145)
Total Bilirubin: 0.7 mg/dL (ref 0.3–1.2)
Total Protein: 7 g/dL (ref 6.0–8.3)

## 2011-05-23 LAB — URINALYSIS, ROUTINE W REFLEX MICROSCOPIC
Glucose, UA: NEGATIVE mg/dL
Hgb urine dipstick: NEGATIVE
Ketones, ur: 15 mg/dL — AB
Nitrite: POSITIVE — AB
Protein, ur: NEGATIVE mg/dL
Specific Gravity, Urine: 1.008 (ref 1.005–1.030)
Urobilinogen, UA: 1 mg/dL (ref 0.0–1.0)
pH: 5 (ref 5.0–8.0)

## 2011-05-23 LAB — URINE MICROSCOPIC-ADD ON

## 2011-05-23 LAB — CBC
HCT: 28.9 % — ABNORMAL LOW (ref 39.0–52.0)
Hemoglobin: 9.9 g/dL — ABNORMAL LOW (ref 13.0–17.0)
MCH: 29.9 pg (ref 26.0–34.0)
MCHC: 34.3 g/dL (ref 30.0–36.0)
MCV: 87.3 fL (ref 78.0–100.0)
Platelets: 121 10*3/uL — ABNORMAL LOW (ref 150–400)
RBC: 3.31 MIL/uL — ABNORMAL LOW (ref 4.22–5.81)
RDW: 17.9 % — ABNORMAL HIGH (ref 11.5–15.5)
WBC: 5 10*3/uL (ref 4.0–10.5)

## 2011-05-23 LAB — TROPONIN I: Troponin I: <0.3 ng/mL (ref <0.30)

## 2011-05-23 MED ORDER — ONDANSETRON 4 MG PO TBDP: 4.0000 mg | ORAL_TABLET | Freq: Three times a day (TID) | ORAL | Status: AC | PRN

## 2011-05-23 MED ORDER — ONDANSETRON HCL 4 MG/2ML IJ SOLN
4.0000 mg | Freq: Once | INTRAMUSCULAR | Status: AC
  Administered 2011-05-23: 4 mg via INTRAVENOUS
  Filled 2011-05-23: qty 2

## 2011-05-23 NOTE — ED Provider Notes (Signed)
History     CSN: [id number]  Arrival date & time 05/23/11  0809   First MD Initiated Contact with Patient 05/23/11 0840      Chief Complaint  Patient presents with  . Emesis    (Consider location/radiation/quality/duration/timing/severity/associated sxs/prior treatment) HPI Patient has emergency department with nausea vomiting since 2 AM.  Patient states that he has not had any abdominal pain, chest pain, shortness of breath, weakness, fever, back pain, or headache.  Patient states he has not tried anything for his symptoms.  Nothing seems to make symptoms better or worse.  Past Medical History  Diagnosis Date  . Diabetes mellitus   . Hypertension   . CHF (congestive heart failure)   . S/P CABG (coronary artery bypass graft)   . Gout   . Nephrolithiasis   . Prostate cancer, recur risk not determined whether low, med or high   . Coronary artery disease   . Hypercholesterolemia   . Black lung disease   . Kidney stone 09/2000  . Angina   . MI (myocardial infarction)     "I've had 2; didn't know it"  . CVA (cerebral vascular accident)     "I've had 2; didn't know it"  . Bradycardia, sinus   . On home oxygen therapy   . Prostate cancer 11/01/10    gleason 7, 8, 9, gold seeds 02/08/11  . Lung cancer   . COPD (chronic obstructive pulmonary disease)   . Shortness of breath   . GERD (gastroesophageal reflux disease)   . AICD (automatic cardioverter/defibrillator) present   . Arthritis     Past Surgical History  Procedure Date  . Colonoscopy   . Esophagogastroduodenoscopy 02/19/2011    Procedure: ESOPHAGOGASTRODUODENOSCOPY (EGD);  Surgeon: Petra Kuba, MD;  Location: [facility] ENDOSCOPY;  Service: Endoscopy;  Laterality: N/A;  . Upper endoscpopy   . Coronary angioplasty with stent placement 07/1997; 08/1997;03/1998  . Coronary artery bypass graft 01/2000    CABG  X2  . Coronary artery bypass graft 07/2002    CABG X3  . Incision and drainage of wound 08/2002    right thigh; S/P EVH   . Insert / replace / remove pacemaker 1979; 1992; 01/2000;  . Insert / replace / remove pacemaker 09/2003; 06/2006    w/AICD  . Insert / replace / remove pacemaker 12/2004    pacmaker explant  . Shoulder arthroscopy w/ rotator cuff repair 05/2008    left    Family History  Problem Relation Age of Onset  . Alzheimer's disease Father 96  . Cancer Father 43    metastatic prostate cancer  . Diabetes Sister 40  . Diabetes Brother   . Diabetes Brother   . Diabetes Brother   . Hypotension Neg Hx   . Malignant hyperthermia Neg Hx   . Pseudochol deficiency Neg Hx     History  Substance Use Topics  . Smoking status: Former Smoker    Types: Cigarettes    Quit date: 03/12/1949  . Smokeless tobacco: Current User    Types: Chew  . Alcohol Use: No     drank until 7 mos ago, per pt 03/19/11      Review of Systems All pertinent positives and negatives reviewed in the history of present illness  Allergies  Aspirin  Home Medications   Current Outpatient Rx  Name Route Sig Dispense Refill  . ALLOPURINOL 300 MG PO TABS Oral Take 300 mg by mouth daily.      . ATORVASTATIN CALCIUM  20 MG PO TABS Oral Take 20 mg by mouth daily.      [name] CALCIUM-VITAMIN D PO Oral Take 1 tablet by mouth 2 (two) times daily.     [name] VITAMIN B-12 PO Oral Take 1 tablet by mouth 2 (two) times daily.     [name] DIGOXIN 0.125 MG PO TABS Oral Take 125 mcg by mouth daily.      . OMEGA-3 FATTY ACIDS 1000 MG PO CAPS Oral Take 1 g by mouth 2 (two) times daily.     . FUROSEMIDE 20 MG PO TABS Oral Take 20 mg by mouth every morning.     . IBUPROFEN 200 MG PO TABS Oral Take 200 mg by mouth at bedtime.     . ISOSORBIDE MONONITRATE ER 30 MG PO TB24 Oral Take 30 mg by mouth daily.      [name] LISINOPRIL 10 MG PO TABS Oral Take 10 mg by mouth every morning.     [name] LOPERAMIDE HCL 2 MG PO CAPS Oral Take 2 mg by mouth 4 (four) times daily as needed. For diarrhea    . METOPROLOL SUCCINATE ER 25 MG PO TB24 Oral Take 25 mg by mouth every  morning.    [name] OMEPRAZOLE 20 MG PO CPDR Oral Take 20 mg by mouth every morning.     . OXYCODONE HCL 5 MG PO TABS Oral Take 1 tablet (5 mg total) by mouth every 4 (four) hours as needed. 65 tablet 0  . PHENAZOPYRIDINE HCL 200 MG PO TABS Oral Take 200 mg by mouth 3 (three) times daily as needed. For burning associated with UTI    . POTASSIUM CHLORIDE CRYS ER 20 MEQ PO TBCR Oral Take 20 mEq by mouth 2 (two) times daily.      . TRAMADOL HCL 50 MG PO TABS Oral Take 50 mg by mouth every 6 (six) hours as needed. For pain.    . WARFARIN SODIUM 2.5 MG PO TABS Oral Take 1.25-2.5 mg by mouth every evening. Take 1/2 tablet on Mon & Fri & 1 tablet on all other days      BP 136/61  Pulse 60  Temp(Src) 97.7 F (36.5 C) (Oral)  Resp 18  Ht 5\' 6"  (1.676 m)  Wt 138 lb (62.596 kg)  BMI 22.27 kg/m2  SpO2 99%  Physical Exam  Constitutional: He is oriented to person, place, and time. He appears well-developed and well-nourished. No distress.  HENT:  Head: Normocephalic and atraumatic.  Eyes: Pupils are equal, round, and reactive to light.  Neck: Normal range of motion.  Cardiovascular: Normal rate and regular rhythm.  Exam reveals no gallop and no friction rub.   No murmur heard. Pulmonary/Chest: Effort normal and breath sounds normal. No respiratory distress. He has no wheezes.  Abdominal: Soft. Bowel sounds are normal. He exhibits no distension. There is no tenderness. There is no rebound and no guarding.  Neurological: He is alert and oriented to person, place, and time.  Skin: Skin is warm and dry. No rash noted.    ED Course  Procedures (including critical care time)  Labs Reviewed  CBC - Abnormal; Notable for the following:    RBC 3.31 (*)    Hemoglobin 9.9 (*)    HCT 28.9 (*)    RDW 17.9 (*)    Platelets 121 (*)    All other components within normal limits  DIFFERENTIAL - Abnormal; Notable for the following:    Neutrophils Relative 78 (*)    Lymphocytes Relative  7 (*)    Lymphs Abs 0.3  (*)    All other components within normal limits  COMPREHENSIVE METABOLIC PANEL - Abnormal; Notable for the following:    Sodium 132 (*)    Glucose, Bld 104 (*)    Albumin 3.3 (*)    GFR calc non Af Amer 62 (*)    GFR calc Af Amer 72 (*)    All other components within normal limits  URINALYSIS, ROUTINE W REFLEX MICROSCOPIC - Abnormal; Notable for the following:    Color, Urine ORANGE (*) BIOCHEMICALS MAY BE AFFECTED BY COLOR   Bilirubin Urine SMALL (*)    Ketones, ur 15 (*)    Nitrite POSITIVE (*)    Leukocytes, UA TRACE (*)    All other components within normal limits  URINE MICROSCOPIC-ADD ON - Abnormal; Notable for the following:    Casts HYALINE CASTS (*) GRANULAR CAST   All other components within normal limits  TROPONIN I     Patient is feeling better following him small bolus of 250 mL of normal saline along with Zofran.  He is tolerating oral trial of fluid is.  His lab testing is stable as compared to his previous testing.  This appears to be consistent with the recent nausea vomiting diarrhea and seen in the last week or so here in the community.  Patient had a recent hospitalization as well within this time frame.  Patient has a well appearing on exam for his age and medical conditions.  His clinical picture does not show overwhelming signs of dehydration.  Patient will be asked return here for any worsening in his condition.  He is advised to slowly increase his fluid intake.     MDM  MDM Number of Diagnoses or Management Options   Amount and/or Complexity of Data Reviewed Clinical lab tests: ordered and reviewed Decide to obtain previous medical records or to obtain history from someone other than the patient: yes Review and summarize past medical records: yes  Patient Progress Patient progress: improved ,   MDM Reviewed: nursing note, vitals and previous chart Reviewed previous: labs Interpretation: labs and ECG     Medical screening  examination/treatment/procedure(s) were conducted as a shared visit with non-physician practitioner(s) and myself.  I personally evaluated the patient during the encounter Pt is a pleasant elderly man who had vomiting during the night.  He is undergoing radiation therapy for prostate cancer.  He also has heart disease and has an AICD.  Exam shows him to be a spry old man in no distress. Lungs clear, heart sounds norrmal, abdomen soft and nontender.  Urine dark from Pyridium.  He is symptomatically improved and can be released home. Osvaldo Human, M.D.    Carlyle Dolly, PA-C 05/23/11 1302  Carleene Cooper III, MD 05/23/11 [phone]

## 2011-05-23 NOTE — Telephone Encounter (Signed)
05/22/11 0930  Received call from [place], RT RT on [facility] that the patient was discharged from the hospital over the weekend. Victorino Dike reports that she spoke with the patient's daughter, [name], who verbalized they were not return for radiation treatment until the hospitalist called with a test result. This Clinical research associate left message for [employer], patient's daughter, to call her with clarification. Discharge summary placed on Dr. Rennie Plowman desk for review.

## 2011-05-23 NOTE — Discharge Instructions (Signed)
Return here as needed for any worsening in your condition.  Slowly increase her fluid intake.  Followup with your primary care Dr. for recheck.

## 2011-05-23 NOTE — ED Provider Notes (Signed)
9:39 AM  Date: 05/23/2011  Rate: 61  Rhythm: dual chamber pacing.  QRS Axis: left  Intervals: normal  ST/T Wave abnormalities: ST changes of LBBB  Conduction Disutrbances:none  Narrative Interpretation: Electronically paced rhythm  Old EKG Reviewed: unchanged    Carleene Cooper III, MD 05/23/11 [phone]

## 2011-05-23 NOTE — Telephone Encounter (Signed)
Left message for Dawn, patient's daughter, to phone this Clinical research associate. Want to inquire on patient status. Awaiting return call.

## 2011-05-23 NOTE — ED Notes (Signed)
Pt presents with onset of vomiting that began last night, difficulty voiding and when he does void, he reports dysuria.  Pt denies any abdominal pain.

## 2011-05-23 NOTE — ED Notes (Signed)
Pt states that released from hospital  Day before yesterday, had been here a week, states has  Gotten weaker, burns when he voids and has had vomiting. States did not have foley cath while in hospital. Has hx of CABG and [place]

## 2011-05-24 ENCOUNTER — Ambulatory Visit: Payer: PRIVATE HEALTH INSURANCE

## 2011-05-25 ENCOUNTER — Other Ambulatory Visit: Payer: Self-pay | Admitting: Cardiology

## 2011-05-25 ENCOUNTER — Ambulatory Visit: Payer: PRIVATE HEALTH INSURANCE

## 2011-05-28 ENCOUNTER — Ambulatory Visit: Payer: PRIVATE HEALTH INSURANCE

## 2011-05-29 ENCOUNTER — Ambulatory Visit: Payer: PRIVATE HEALTH INSURANCE

## 2011-05-30 ENCOUNTER — Ambulatory Visit: Payer: PRIVATE HEALTH INSURANCE

## 2011-05-31 ENCOUNTER — Ambulatory Visit: Payer: PRIVATE HEALTH INSURANCE

## 2011-06-01 ENCOUNTER — Ambulatory Visit: Payer: PRIVATE HEALTH INSURANCE

## 2011-06-04 ENCOUNTER — Ambulatory Visit: Payer: PRIVATE HEALTH INSURANCE

## 2011-06-05 ENCOUNTER — Ambulatory Visit: Payer: PRIVATE HEALTH INSURANCE

## 2011-06-06 ENCOUNTER — Telehealth: Payer: Self-pay

## 2011-06-06 ENCOUNTER — Ambulatory Visit: Payer: PRIVATE HEALTH INSURANCE

## 2011-06-06 NOTE — Telephone Encounter (Signed)
Called patient at home to inquire when he will return for radiation treatment and patient stated he isnot coming back and does not want any further treatment.Informed Miranda RT (tomo) of patient decision. Informed patient I will let Dr. Dayton Scrape and his nurse know of his decision.

## 2011-06-07 ENCOUNTER — Ambulatory Visit: Payer: PRIVATE HEALTH INSURANCE

## 2011-06-07 ENCOUNTER — Encounter: Payer: Self-pay | Admitting: Radiation Oncology

## 2011-06-07 NOTE — Progress Notes (Signed)
[facility] Radiation Oncology End of Treatment Note  Name:[name]  Date:06/07/2011           ZHY:[id number] DOB:[birth year]   Status:outpatient    CC: Pearson Grippe, MD, MD    REFERRING PHYSICIAN: Dr. 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 He was then be anemic and was given a transfusion, but still remained fatigue. Along with his daughter, he decided to stop all treatment.   He did have some loosening of his bowels and mild dysuria and urinary frequency before terminating his therapy.                       PLAN: Routine followup in one month. Patient instructed to call if questions or worsening complaints in interim. He may consider continuation of his androgen deprivation therapy through Dr. Brunilda Payor.

## 2011-06-08 ENCOUNTER — Ambulatory Visit: Payer: PRIVATE HEALTH INSURANCE

## 2011-06-11 ENCOUNTER — Ambulatory Visit: Payer: PRIVATE HEALTH INSURANCE

## 2011-06-12 ENCOUNTER — Ambulatory Visit: Payer: PRIVATE HEALTH INSURANCE

## 2011-06-13 ENCOUNTER — Ambulatory Visit: Payer: PRIVATE HEALTH INSURANCE

## 2011-06-14 ENCOUNTER — Ambulatory Visit: Payer: PRIVATE HEALTH INSURANCE

## 2011-06-15 ENCOUNTER — Ambulatory Visit: Payer: PRIVATE HEALTH INSURANCE

## 2011-06-18 ENCOUNTER — Ambulatory Visit: Payer: PRIVATE HEALTH INSURANCE

## 2011-06-19 ENCOUNTER — Ambulatory Visit: Payer: PRIVATE HEALTH INSURANCE

## 2011-06-20 ENCOUNTER — Ambulatory Visit: Payer: PRIVATE HEALTH INSURANCE

## 2011-06-21 ENCOUNTER — Ambulatory Visit: Payer: PRIVATE HEALTH INSURANCE

## 2011-06-28 ENCOUNTER — Encounter: Payer: PRIVATE HEALTH INSURANCE | Admitting: *Deleted

## 2011-07-04 ENCOUNTER — Encounter: Payer: Self-pay | Admitting: *Deleted

## 2011-07-11 ENCOUNTER — Ambulatory Visit: Payer: PRIVATE HEALTH INSURANCE | Attending: Internal Medicine

## 2011-07-11 DIAGNOSIS — IMO0001 Reserved for inherently not codable concepts without codable children: Secondary | ICD-10-CM | POA: Insufficient documentation

## 2011-07-11 DIAGNOSIS — M25519 Pain in unspecified shoulder: Secondary | ICD-10-CM | POA: Insufficient documentation

## 2011-07-11 DIAGNOSIS — R293 Abnormal posture: Secondary | ICD-10-CM | POA: Insufficient documentation

## 2011-07-18 ENCOUNTER — Ambulatory Visit: Payer: PRIVATE HEALTH INSURANCE | Admitting: Physical Therapy

## 2011-07-20 ENCOUNTER — Ambulatory Visit: Payer: PRIVATE HEALTH INSURANCE

## 2011-07-24 ENCOUNTER — Ambulatory Visit: Payer: PRIVATE HEALTH INSURANCE

## 2011-07-26 ENCOUNTER — Ambulatory Visit (INDEPENDENT_AMBULATORY_CARE_PROVIDER_SITE_OTHER): Payer: PRIVATE HEALTH INSURANCE | Admitting: *Deleted

## 2011-07-26 ENCOUNTER — Encounter: Payer: Self-pay | Admitting: Internal Medicine

## 2011-07-26 ENCOUNTER — Ambulatory Visit: Payer: PRIVATE HEALTH INSURANCE | Admitting: Physical Therapy

## 2011-07-26 DIAGNOSIS — I459 Conduction disorder, unspecified: Secondary | ICD-10-CM

## 2011-07-26 LAB — ICD DEVICE OBSERVATION
BATTERY VOLTAGE: 2.5718 V
HV IMPEDENCE: 42 Ohm
LV LEAD IMPEDENCE ICD: 387.5 Ohm
MODE SWITCH EPISODES: 0
RV LEAD AMPLITUDE: 11.6 mv
RV LEAD IMPEDENCE ICD: 512.5 Ohm
RV LEAD THRESHOLD: 0.5 V
TOT-0008: 0
TOT-0009: 1
TOT-0010: 32
TZAT-0001FASTVT: 1
TZAT-0004FASTVT: 8
TZAT-0013SLOWVT: 3
TZAT-0018SLOWVT: NEGATIVE
TZAT-0020SLOWVT: 1 ms
TZON-0003SLOWVT: 375 ms
TZON-0005SLOWVT: 6
TZON-0010FASTVT: 80 ms
TZON-0010SLOWVT: 80 ms
TZST-0001FASTVT: 2
TZST-0001FASTVT: 3
TZST-0001SLOWVT: 2
TZST-0001SLOWVT: 4
TZST-0001SLOWVT: 5
TZST-0003FASTVT: 25 J
TZST-0003FASTVT: 36 J
TZST-0003FASTVT: 36 J
TZST-0003SLOWVT: 25 J
TZST-0003SLOWVT: 36 J
VF: 0

## 2011-07-26 NOTE — Progress Notes (Signed)
defib check in clinic  

## 2011-08-01 ENCOUNTER — Ambulatory Visit: Payer: PRIVATE HEALTH INSURANCE | Admitting: Physical Therapy

## 2011-08-03 ENCOUNTER — Ambulatory Visit: Payer: PRIVATE HEALTH INSURANCE

## 2011-08-15 ENCOUNTER — Ambulatory Visit: Payer: PRIVATE HEALTH INSURANCE | Attending: Internal Medicine

## 2011-08-15 DIAGNOSIS — M25519 Pain in unspecified shoulder: Secondary | ICD-10-CM | POA: Insufficient documentation

## 2011-08-15 DIAGNOSIS — IMO0001 Reserved for inherently not codable concepts without codable children: Secondary | ICD-10-CM | POA: Insufficient documentation

## 2011-08-15 DIAGNOSIS — R293 Abnormal posture: Secondary | ICD-10-CM | POA: Insufficient documentation

## 2011-08-20 IMAGING — CT CT ABD-PELV W/ CM
2 of 5 series · 16 of 46 positions shown, 18 images · IV contrast (CONTRAST)
Comparison: 04/28/2009

CLINICAL DATA: Abdominal pain and chest pain.

CT ABDOMEN AND PELVIS WITH CONTRAST
TECHNIQUE: Multidetector CT imaging of the abdomen and pelvis was
performed following the standard protocol during bolus
administration of intravenous contrast.
Contrast: 80 ml of omni 300

[Series 2: routine · axial · 0.73mm/px · z∈[-450,-40]mm · 13 of 92 slices shown, 15 images]
[im 5/92  soft-tissue]
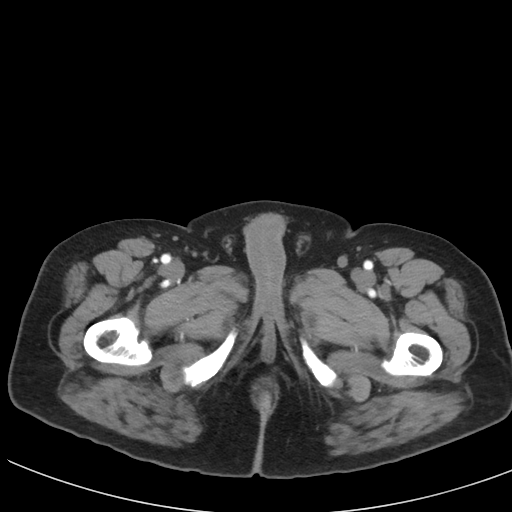
[im 5/92  bone]
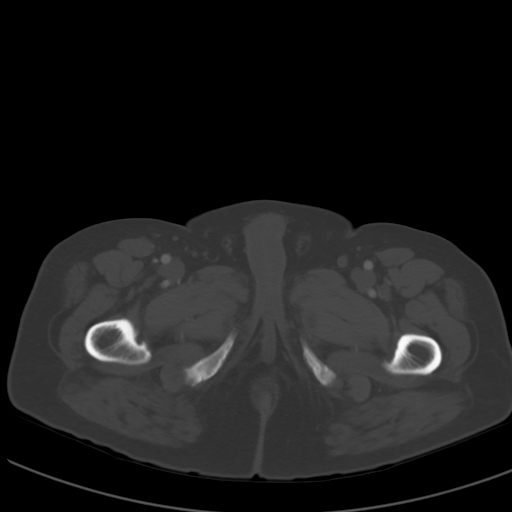
[im 15/92  soft-tissue]
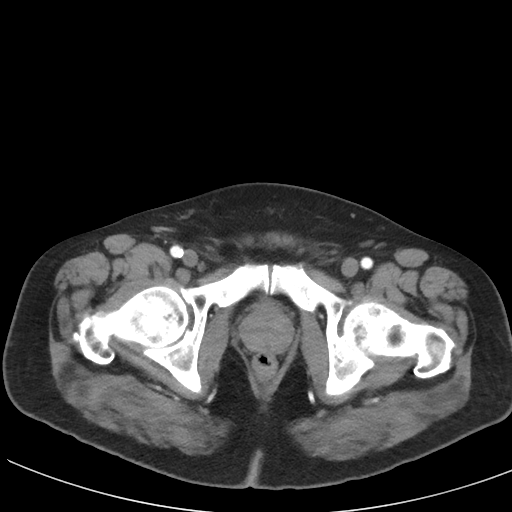
[im 20/92  soft-tissue]
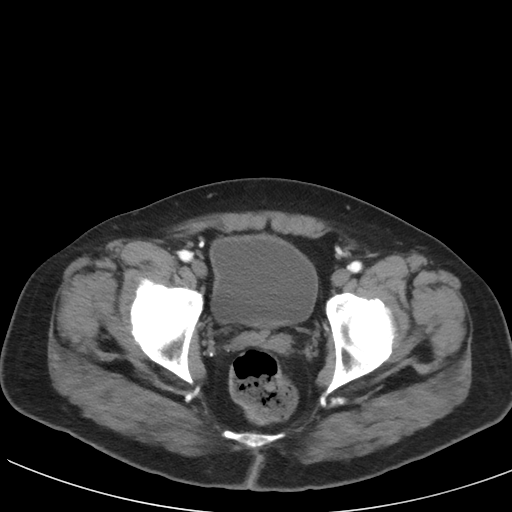
[im 24/92  soft-tissue]
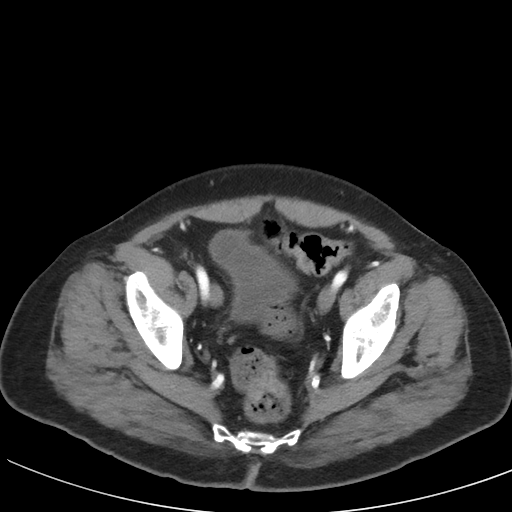
[im 34/92  soft-tissue]
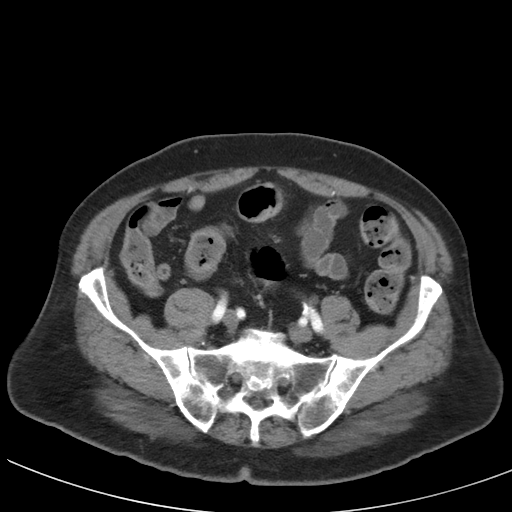
[im 39/92  soft-tissue]
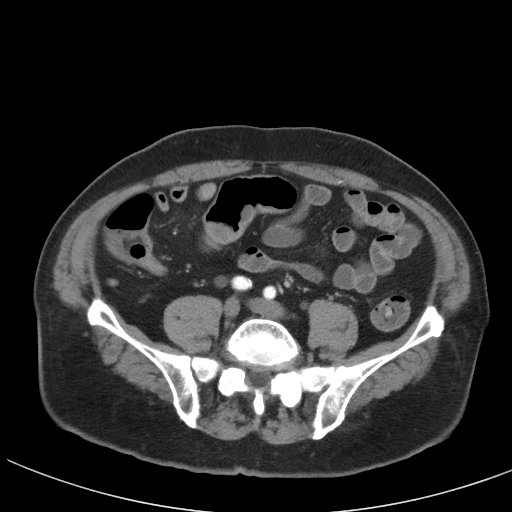
[im 48/92  soft-tissue]
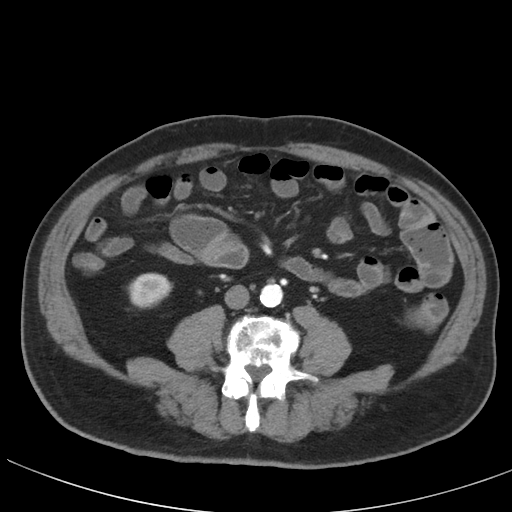
[im 53/92  soft-tissue]
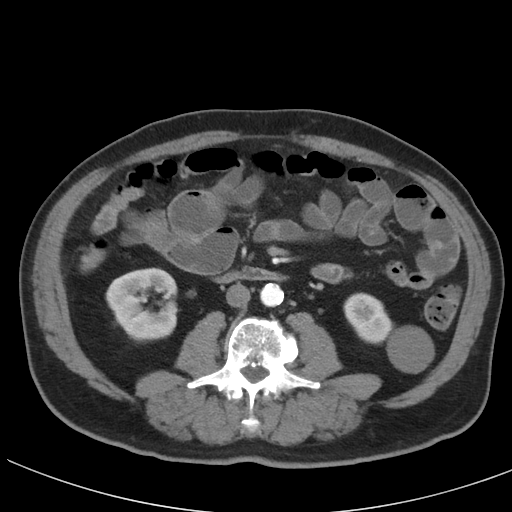
[im 58/92  soft-tissue]
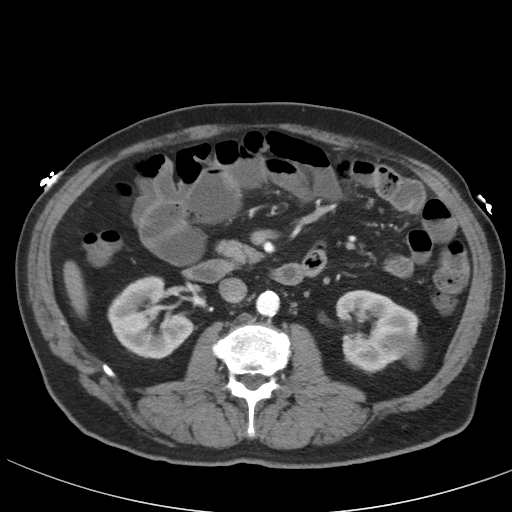
[im 58/92  bone]
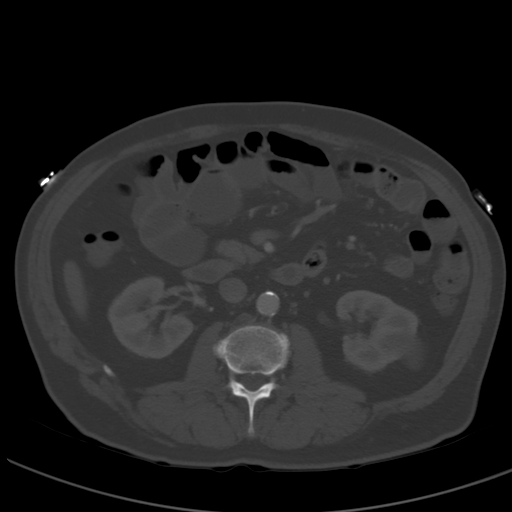
[im 68/92  soft-tissue]
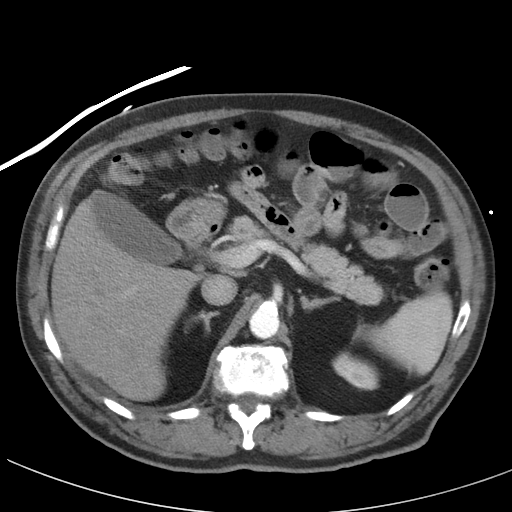
[im 72/92  soft-tissue]
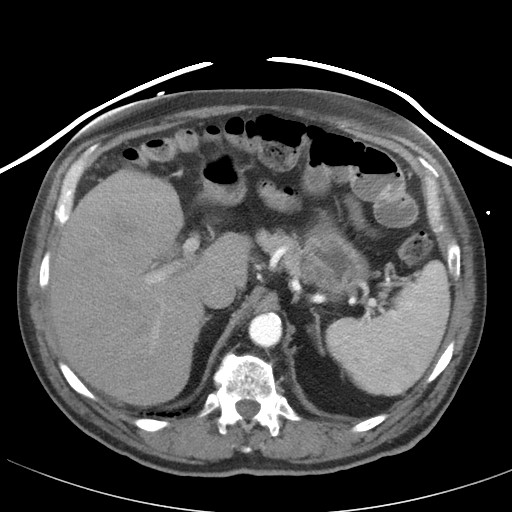
[im 77/92  soft-tissue]
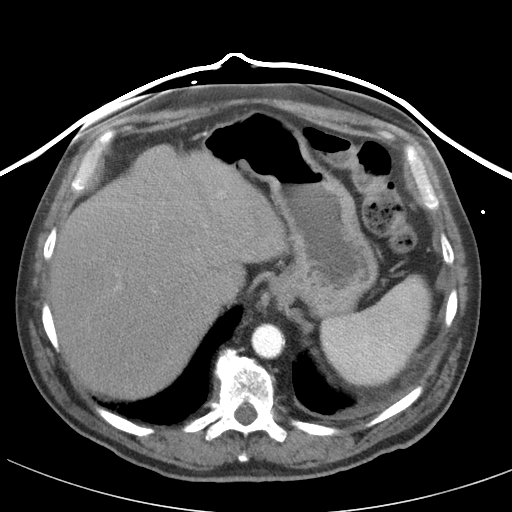
[im 87/92  soft-tissue]
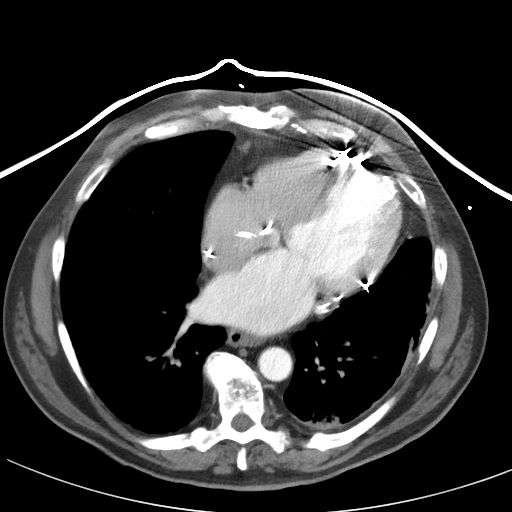

[mpr, coronals, coronal · coronal · 0.89mm/px · 3 of 101 slices shown]
[im 34/101  soft-tissue]
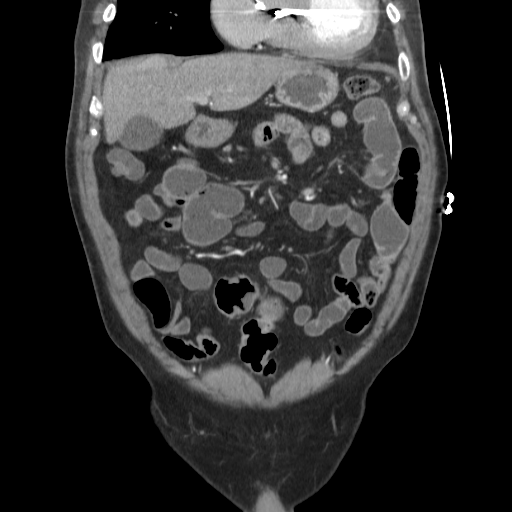
[im 45/101  soft-tissue]
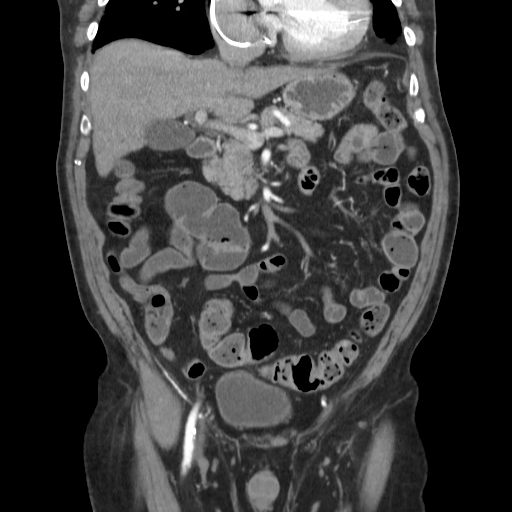
[im 56/101  soft-tissue]
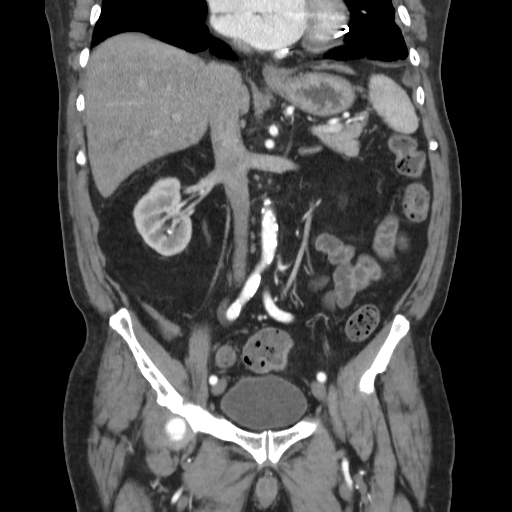

[16 of 46 positions shown; findings below may reference images not displayed]

FINDINGS: Thickening and calcification of the left lung pleura is
identified.  There is a subpleural nodule identified within the
right middle lobe measuring 0.7 cm.  This is unchanged from
previous exam.  Reticular interstitial markings are identified
within both lung bases.

The spleen, adrenal glands, and pancreas are all normal.  There are
no focal liver abnormalities.  The gallbladder appears normal.  No
biliary dilatation.

There is no focal pancreatic abnormality.  Nonobstructing stone is
noted within the inferior pole of the right kidney measuring 7 mm,
image 42.  There is a simple appearing cyst within the inferior
pole the left kidney.

No enlarged lymph nodes are identified within the upper abdomen.
There are no enlarged pelvic or inguinal lymph nodes.

There is no free fluid or fluid collections within the abdomen or
pelvis.

The urinary bladder appears normal.

The stomach appears normal.  There are several small bowel loops
which are increase in caliber measuring 3.9 cm in diameter.  Small
bowel  air-fluid levels are noted.  The  colon appears normal
containing gas and stool up to the rectum.

Review of the visualized osseous structures is significant for
multilevel spondylosis.
IMPRESSION: 1.  Small bowel loops are increased in caliber containing air-fluid
levels.   Findings may indicate small bowel enteritis, ileus, or
low grade partial obstruction.
2.  Mildly complex cyst involving the inferior pole of the left
kidney.
There are two tiny peripheral areas of mural nodularity.  Since
these were not identified on the noncontrast exam from [DATE].
This finding suggests the presence of enhancing nodularity.  I
would recommend the patient have a more definitive evaluation with
non-emergent, outpatient contrast enhanced MRI of the kidneys.

## 2011-08-20 IMAGING — CR DG ABDOMEN ACUTE W/ 1V CHEST
3 series · 3 of 3 positions shown · non-contrast
Comparison: Abdominal CT and acute abdominal series 04/28/2009.

CLINICAL DATA: Mid chest and abdominal pain with shortness of
breath.  History of asthma and diabetes.

ACUTE ABDOMEN SERIES (ABDOMEN 2 VIEW & CHEST 1 VIEW)

[w chest pa]
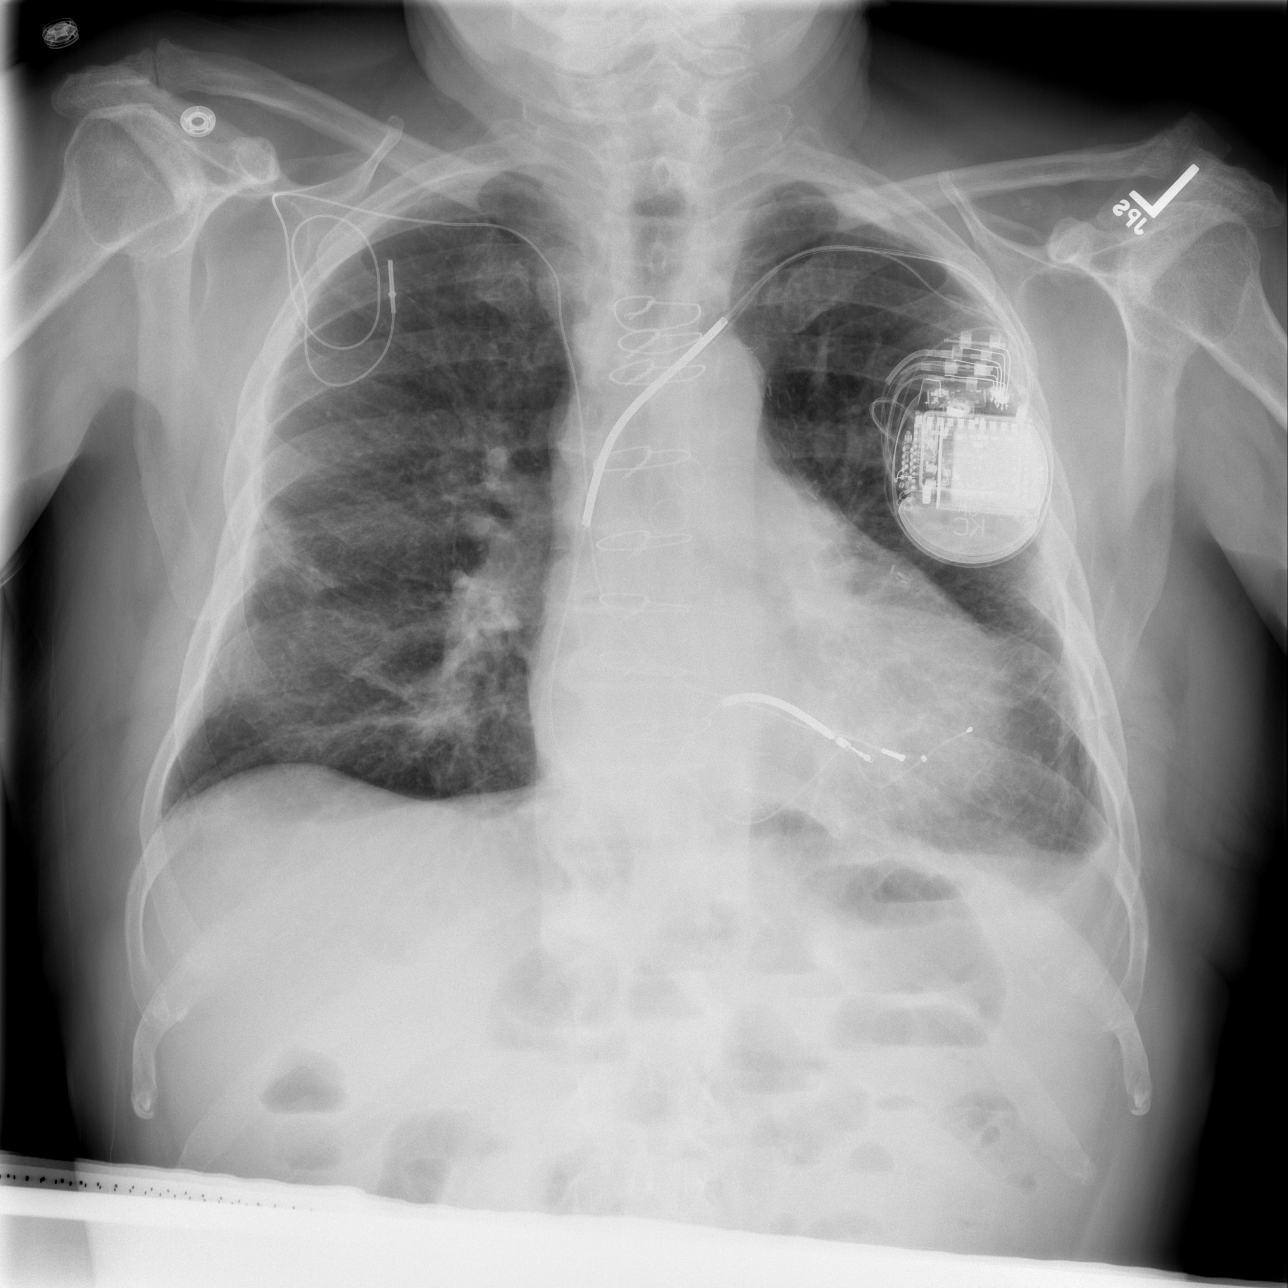

[w abdomen upright]
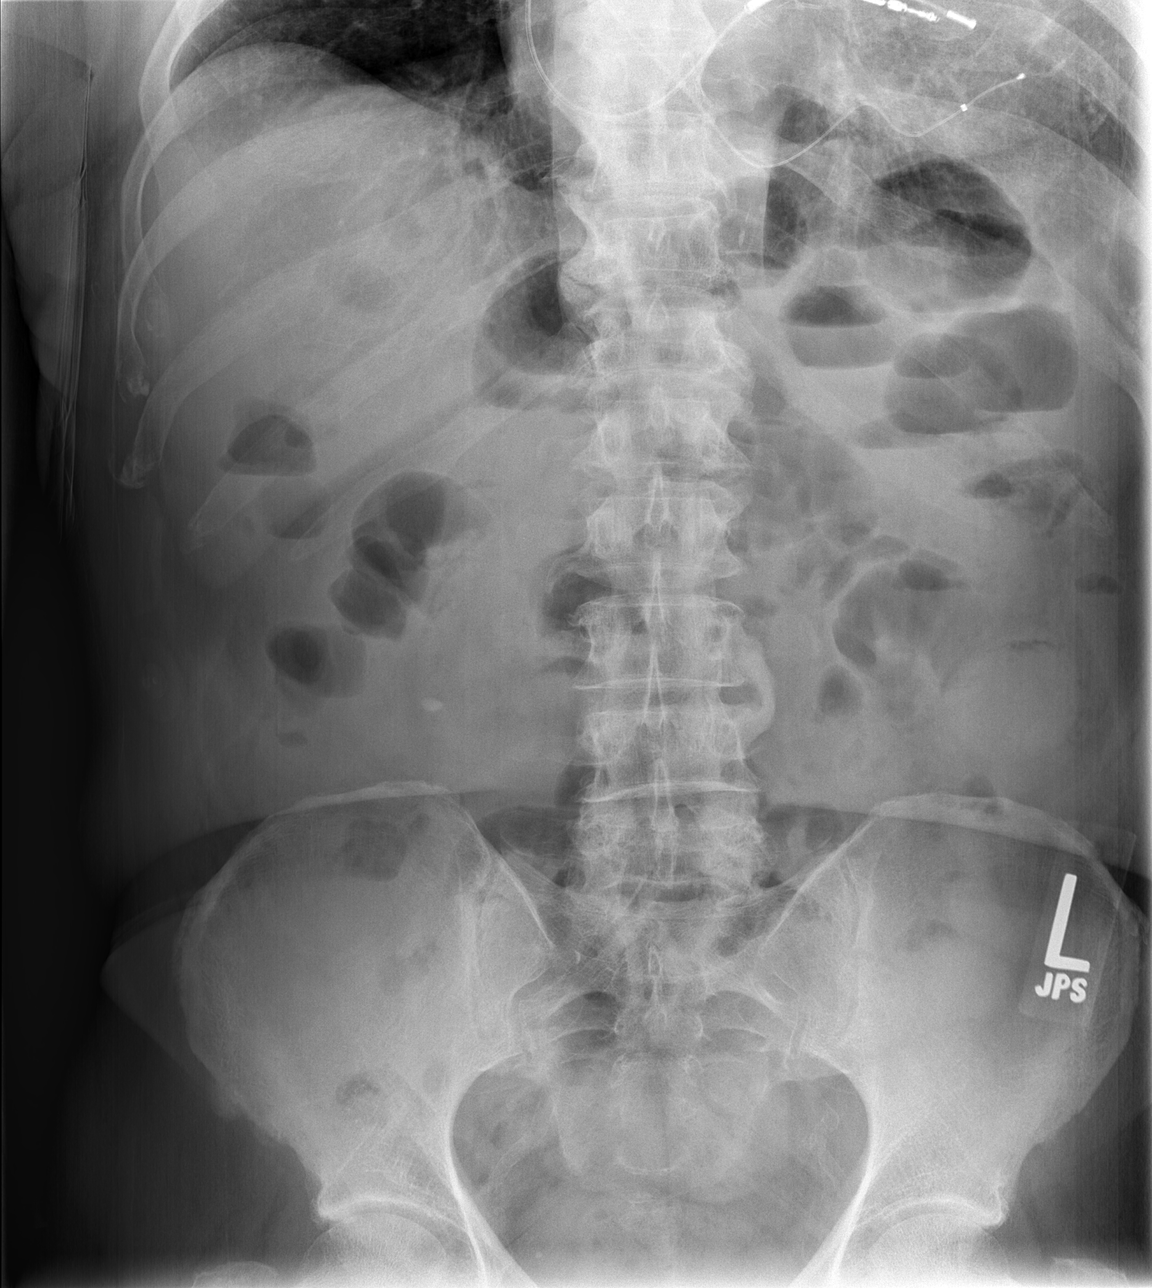

[t abdomen supine]
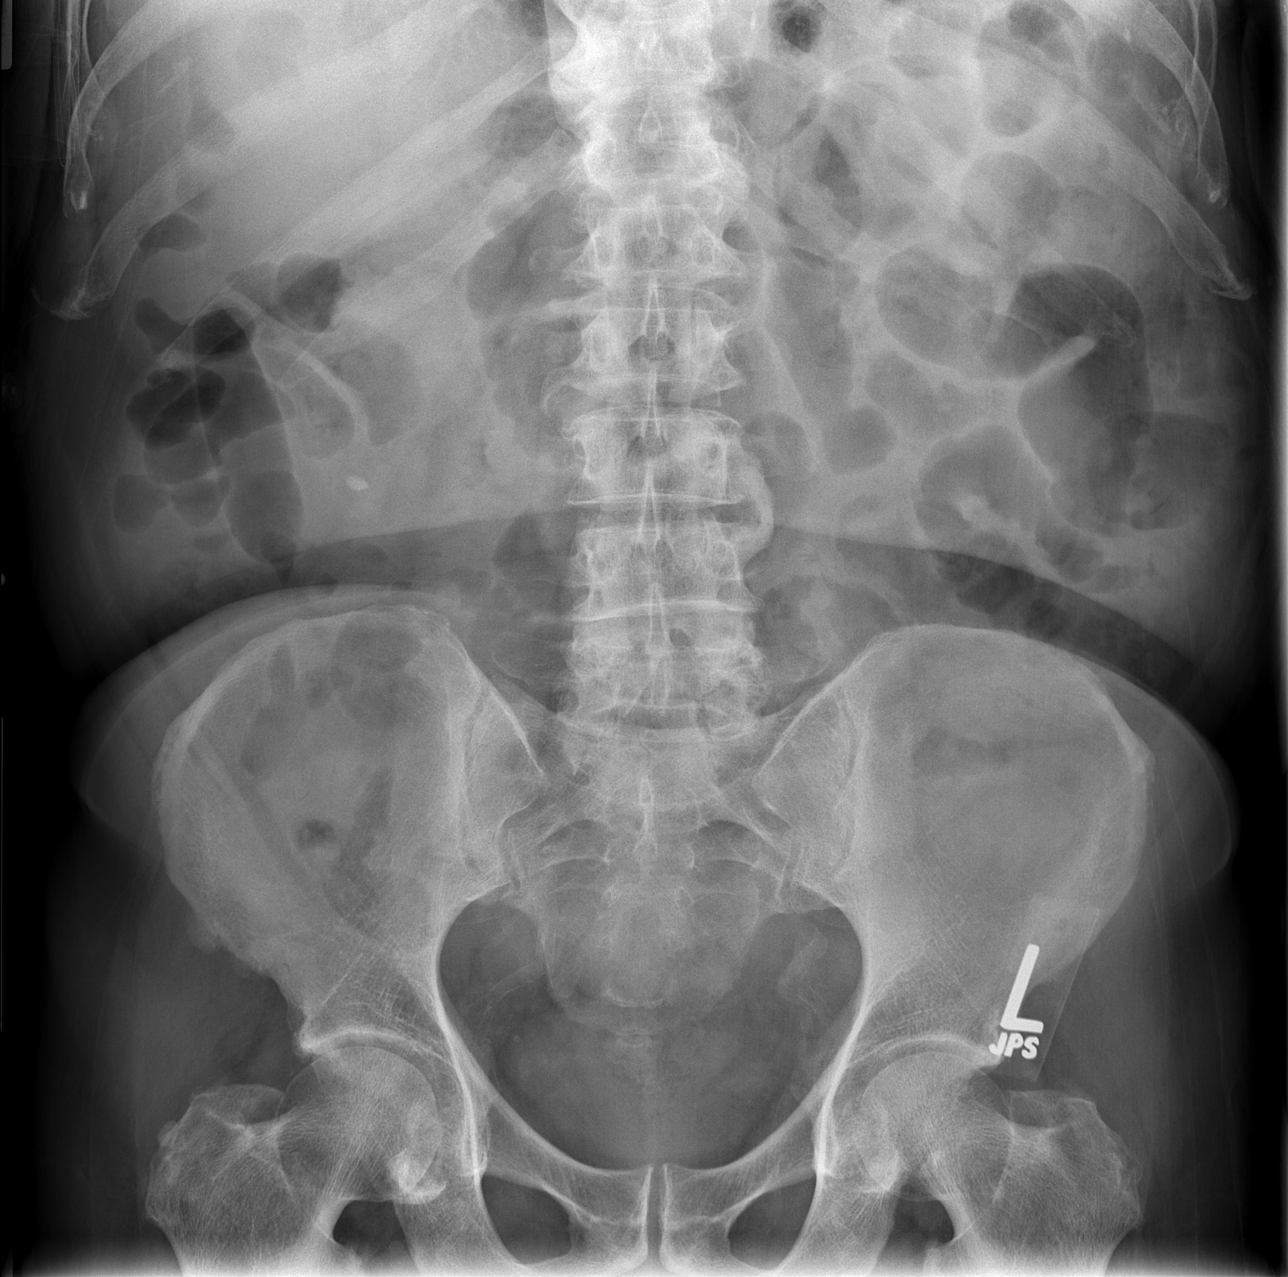

[3 of 3 positions shown; findings below may reference images not displayed]

FINDINGS: Left subclavian AICD leads appear stable.  The heart size
and mediastinal contours are stable status post CABG.  There is
stable peripheral scarring in the mid right lung and stable pleural
thickening on the left contributing to chronic blunting of the left
costophrenic angle.

The bowel gas pattern is similar to the prior examination with
scattered air fluid levels throughout nondistended bowel.  There is
no free intraperitoneal air.  A right renal calculus is unchanged.
Vascular calcifications and lumbar spondylosis are noted.
IMPRESSION: 1.  Overall stable examination compared with prior study of 9
months ago.
2.  The bowel gas pattern is nonspecific with scattered air fluid
levels within nondistended bowel.

## 2011-10-06 IMAGING — CT CT ABD-PELV W/ CM
2 of 4 series · 13 of 32 positions shown, 18 images · IV contrast (agent unspecified)
Comparison: CT abdomen pelvis 01/19/2010, 04/28/2009 and
02/27/2009.

CLINICAL DATA: Mid and lower abdominal pain.

CT ABDOMEN AND PELVIS WITH CONTRAST
TECHNIQUE: Multidetector CT imaging of the abdomen and pelvis was
performed following the standard protocol during bolus
administration of intravenous contrast.
Contrast: 100 ml Ymnipaque-RWW.

[Series 2: routine abdomen · axial · 0.82mm/px · z∈[-371,-51]mm · 5 of 97 slices shown, 10 images]
[im 17/97  soft-tissue]
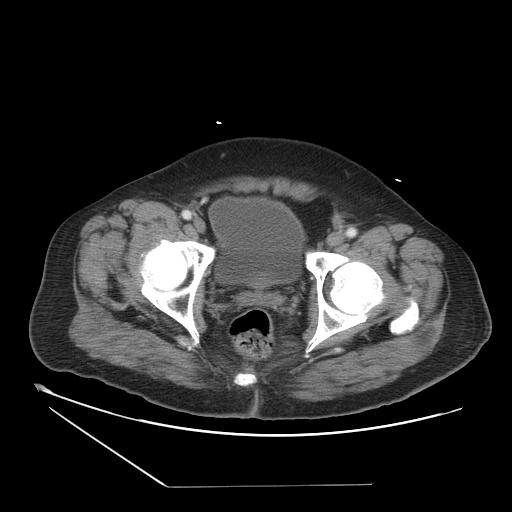
[im 17/97  bone]
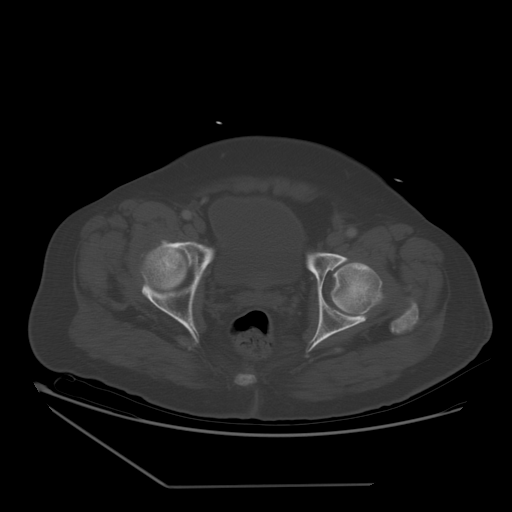
[im 33/97  soft-tissue]
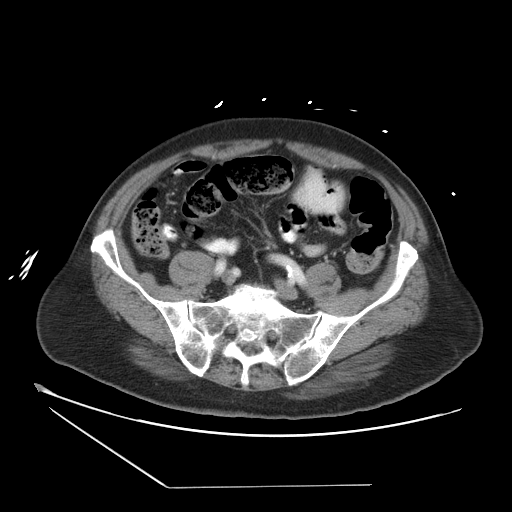
[im 33/97  lung]
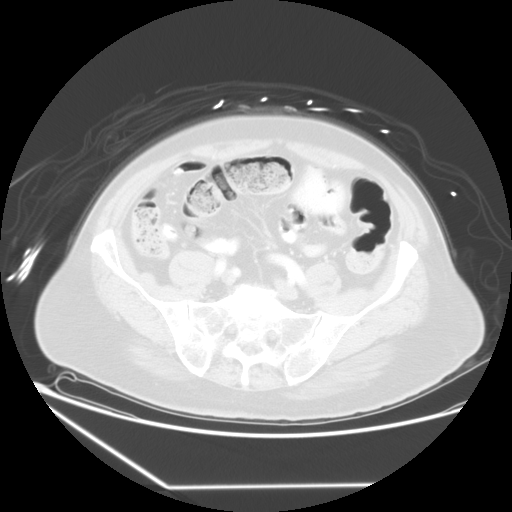
[im 49/97  soft-tissue]
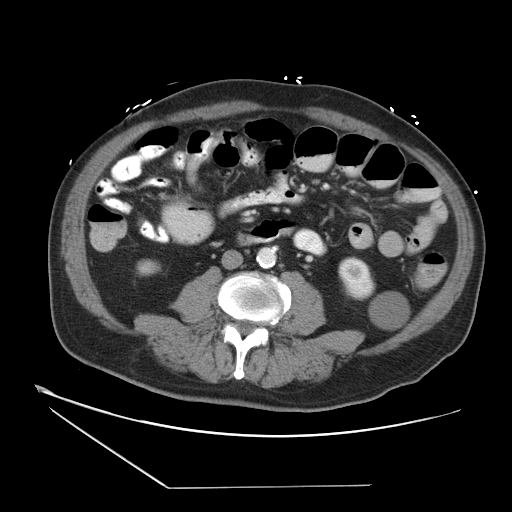
[im 49/97  lung]
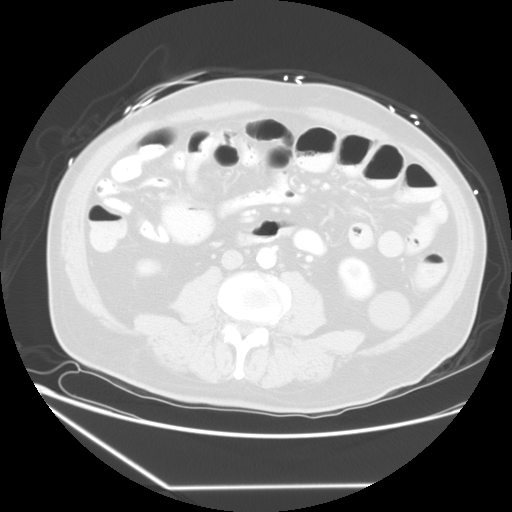
[im 65/97  soft-tissue]
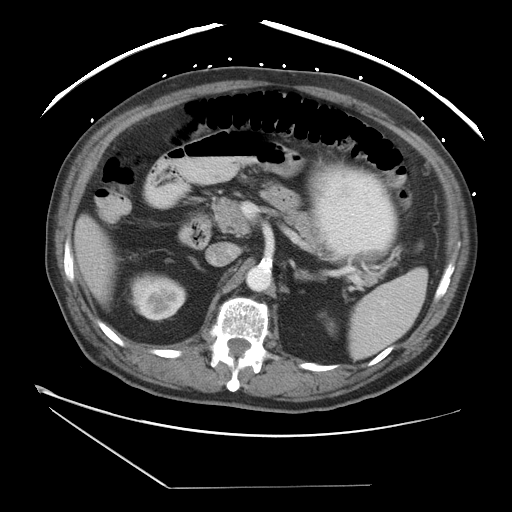
[im 65/97  lung]
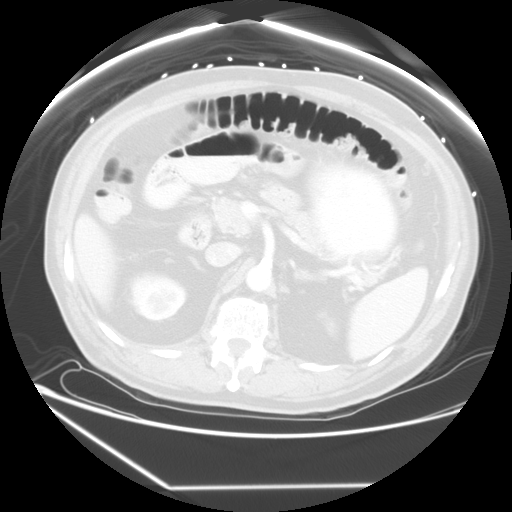
[im 81/97  soft-tissue]
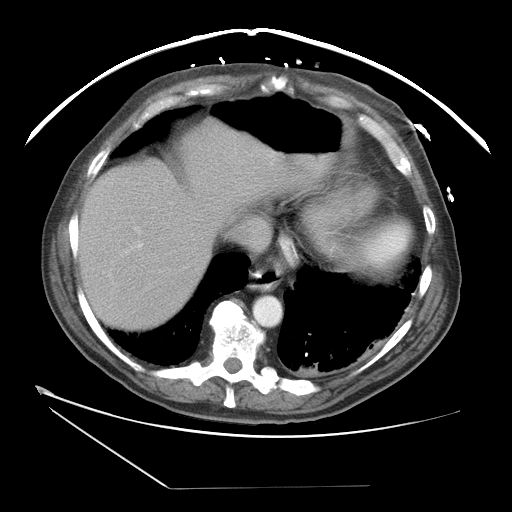
[im 81/97  lung]
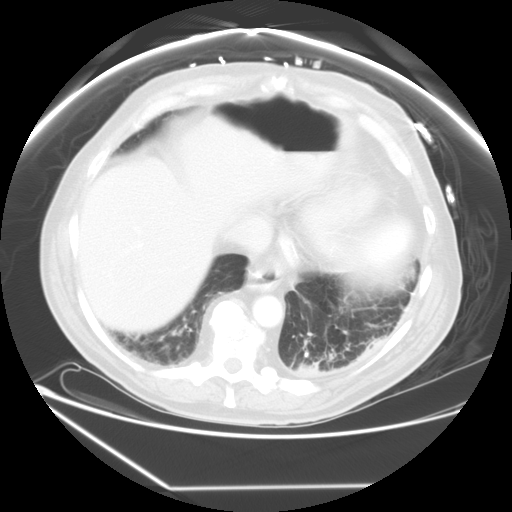

[Series 104: reformatted · sagittal · 0.96mm/px · 8 of 190 slices shown]
[im 16/190  soft-tissue]
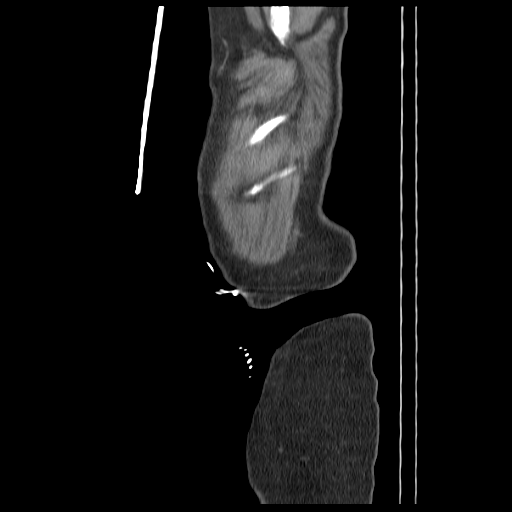
[im 48/190  soft-tissue]
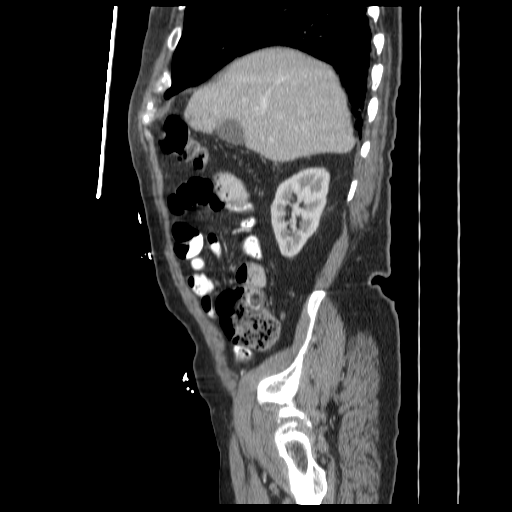
[im 64/190  soft-tissue]
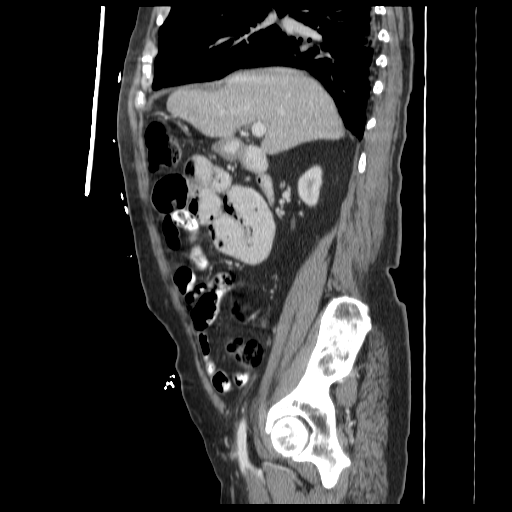
[im 79/190  soft-tissue]
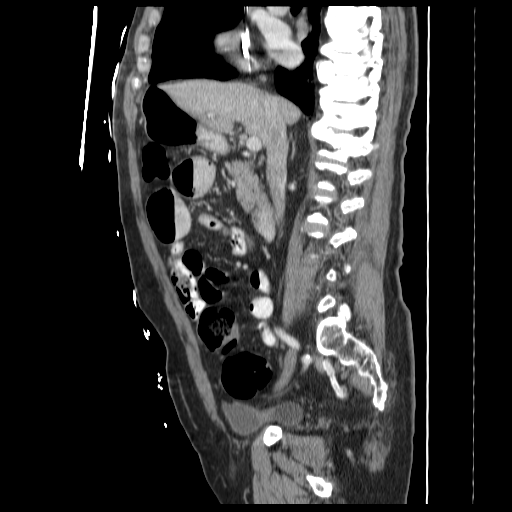
[im 111/190  soft-tissue]
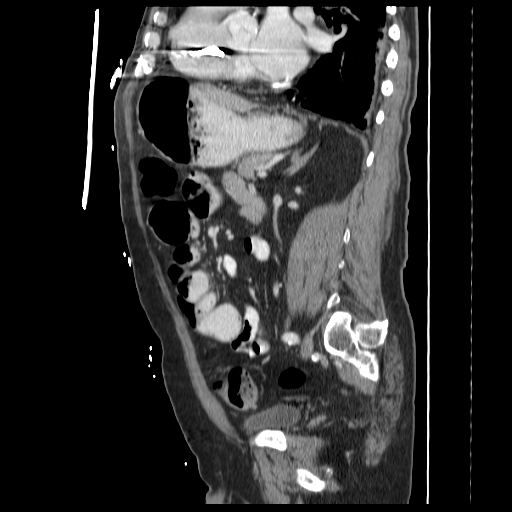
[im 127/190  soft-tissue]
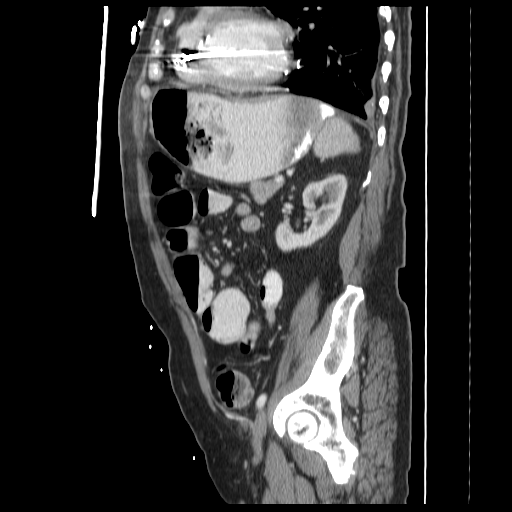
[im 142/190  soft-tissue]
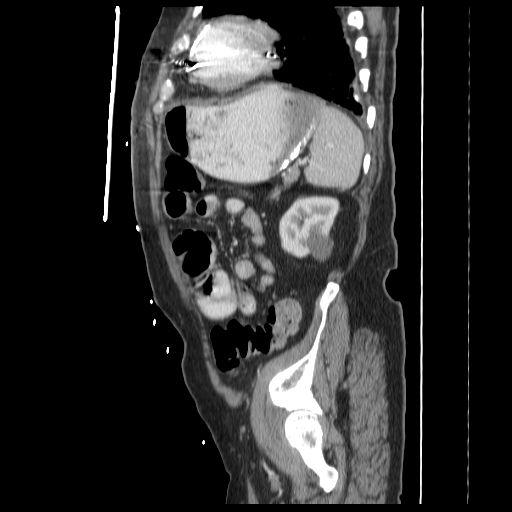
[im 174/190  soft-tissue]
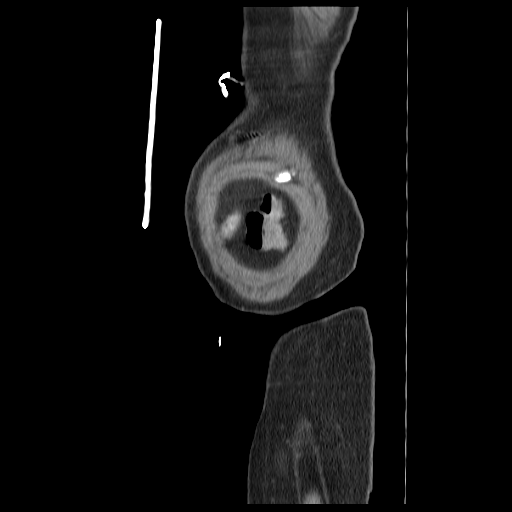

[13 of 32 positions shown; findings below may reference images not displayed]

FINDINGS: Lungs again demonstrate basilar atelectatic change.
Previously seen seen 0.7 cm nodule along the minor fissure is seen
only on the first image of this exam.  Atherosclerotic vascular
disease is noted.  Cardiomegaly.  Small hiatal hernia.  Calcified
pleural plaque on the left.

There are some scattered mildly prominent loops of small bowel
which do not appear markedly changed.  The appearance is not
typical of small bowel obstruction.  No pneumatosis, wall
thickening or free air is identified.  The colon and appendix
appear normal.  The gallbladder, liver, spleen, adrenal glands and
pancreas are unremarkable.  An unchanged nonobstructing 0.6 cm
stone is seen in the lower pole of the right kidney.  Low
attenuating lesion off the lower pole of the left kidney which has
been demonstrated to be a cyst on prior examinations is also
unchanged.  Atherosclerotic vascular disease is seen in a
nonaneurysmal aorta.  No focal bony abnormality with scattered
Schmorl's nodes in the lumbar spine and spondylosis noted.
IMPRESSION: 1.  No acute finding.
2.  Unchanged nonobstructing stone lower pole right kidney.
3.  Unchanged left renal cyst.
4.  Small hiatal hernia.

## 2011-12-06 ENCOUNTER — Emergency Department (HOSPITAL_COMMUNITY)
Admission: EM | Admit: 2011-12-06 | Discharge: 2011-12-06 | Disposition: A | Discharge status: Home / Self Care | Payer: PRIVATE HEALTH INSURANCE | Attending: Emergency Medicine | Admitting: Emergency Medicine

## 2011-12-06 ENCOUNTER — Encounter (HOSPITAL_COMMUNITY): Payer: Self-pay | Admitting: Neurology

## 2011-12-06 ENCOUNTER — Emergency Department (HOSPITAL_COMMUNITY): Payer: PRIVATE HEALTH INSURANCE

## 2011-12-06 DIAGNOSIS — J4489 Other specified chronic obstructive pulmonary disease: Secondary | ICD-10-CM | POA: Insufficient documentation

## 2011-12-06 DIAGNOSIS — F0781 Postconcussional syndrome: Secondary | ICD-10-CM | POA: Insufficient documentation

## 2011-12-06 DIAGNOSIS — I2510 Atherosclerotic heart disease of native coronary artery without angina pectoris: Secondary | ICD-10-CM | POA: Insufficient documentation

## 2011-12-06 DIAGNOSIS — I252 Old myocardial infarction: Secondary | ICD-10-CM | POA: Insufficient documentation

## 2011-12-06 DIAGNOSIS — S0083XA Contusion of other part of head, initial encounter: Secondary | ICD-10-CM | POA: Insufficient documentation

## 2011-12-06 DIAGNOSIS — Z951 Presence of aortocoronary bypass graft: Secondary | ICD-10-CM | POA: Insufficient documentation

## 2011-12-06 DIAGNOSIS — E119 Type 2 diabetes mellitus without complications: Secondary | ICD-10-CM | POA: Insufficient documentation

## 2011-12-06 DIAGNOSIS — Y92009 Unspecified place in unspecified non-institutional (private) residence as the place of occurrence of the external cause: Secondary | ICD-10-CM | POA: Insufficient documentation

## 2011-12-06 DIAGNOSIS — I10 Essential (primary) hypertension: Secondary | ICD-10-CM | POA: Insufficient documentation

## 2011-12-06 DIAGNOSIS — S0003XA Contusion of scalp, initial encounter: Secondary | ICD-10-CM | POA: Insufficient documentation

## 2011-12-06 DIAGNOSIS — W1800XA Striking against unspecified object with subsequent fall, initial encounter: Secondary | ICD-10-CM

## 2011-12-06 DIAGNOSIS — J449 Chronic obstructive pulmonary disease, unspecified: Secondary | ICD-10-CM | POA: Insufficient documentation

## 2011-12-06 DIAGNOSIS — Z7901 Long term (current) use of anticoagulants: Secondary | ICD-10-CM | POA: Insufficient documentation

## 2011-12-06 DIAGNOSIS — E7800 Pure hypercholesterolemia, unspecified: Secondary | ICD-10-CM | POA: Insufficient documentation

## 2011-12-06 DIAGNOSIS — Z79899 Other long term (current) drug therapy: Secondary | ICD-10-CM | POA: Insufficient documentation

## 2011-12-06 DIAGNOSIS — Z8673 Personal history of transient ischemic attack (TIA), and cerebral infarction without residual deficits: Secondary | ICD-10-CM | POA: Insufficient documentation

## 2011-12-06 DIAGNOSIS — Z95810 Presence of automatic (implantable) cardiac defibrillator: Secondary | ICD-10-CM | POA: Insufficient documentation

## 2011-12-06 DIAGNOSIS — W1809XA Striking against other object with subsequent fall, initial encounter: Secondary | ICD-10-CM | POA: Insufficient documentation

## 2011-12-06 LAB — PROTIME-INR: Prothrombin Time: 20.5 seconds — ABNORMAL HIGH (ref 11.6–15.2)

## 2011-12-06 NOTE — ED Provider Notes (Signed)
History     CSN: [id number]  Arrival date & time 12/06/11  7846   First MD Initiated Contact with Patient 12/06/11 [phone]      Chief Complaint  Patient presents with  . Headache    (Consider location/radiation/quality/duration/timing/severity/associated sxs/prior treatment) HPI Comments: Mr. Krenn presets for evaluation of a headache that has been ongoing since 9/20.  He tripped over a carpet in his bathroom floor and struck his head/face against the side of the bathtub.  He denies any LOC but reports that he has had a headache and some dizziness since then.  He denies eye pain, double vision, visual disturbances, jaw pain, NVD, midline neck pain, CP, SOB (above baseline), abdominal, or hip pain.  He reports that he has follow-up next week with his cardiologist.  Patient is a 78 y.o. male presenting with headaches. The history is provided by the patient. No language interpreter was used.  Headache  This is a new problem. Episode onset: 6 days. The problem occurs constantly. The problem has not changed since onset.The pain is located in the temporal and left unilateral region. The quality of the pain is described as dull and throbbing. The pain is at a severity of 3/10. The pain is mild. The pain does not radiate. Pertinent negatives include no anorexia, no fever, no malaise/fatigue, no chest pressure, no near-syncope, no orthopnea, no palpitations, no syncope, no shortness of breath, no nausea and no vomiting. Associated symptoms comments: dizziness. He has tried nothing for the symptoms.    Past Medical History  Diagnosis Date  . Diabetes mellitus   . Hypertension   . CHF (congestive heart failure)   . S/P CABG (coronary artery bypass graft)   . Gout   . Nephrolithiasis   . Prostate cancer, recur risk not determined whether low, med or high   . Coronary artery disease   . Hypercholesterolemia   . Black lung disease   . Kidney stone 09/2000  . Angina   . MI (myocardial infarction)      "I've had 2; didn't know it"  . CVA (cerebral vascular accident)     "I've had 2; didn't know it"  . Bradycardia, sinus   . On home oxygen therapy   . Prostate cancer 11/01/10    gleason 7, 8, 9, gold seeds 02/08/11  . Lung cancer   . COPD (chronic obstructive pulmonary disease)   . Shortness of breath   . GERD (gastroesophageal reflux disease)   . AICD (automatic cardioverter/defibrillator) present   . Arthritis     Past Surgical History  Procedure Date  . Colonoscopy   . Esophagogastroduodenoscopy 02/19/2011    Procedure: ESOPHAGOGASTRODUODENOSCOPY (EGD);  Surgeon: Petra Kuba, MD;  Location: [facility] ENDOSCOPY;  Service: Endoscopy;  Laterality: N/A;  . Upper endoscpopy   . Coronary angioplasty with stent placement 07/1997; 08/1997;03/1998  . Coronary artery bypass graft 01/2000    CABG  X2  . Coronary artery bypass graft 07/2002    CABG X3  . Incision and drainage of wound 08/2002    right thigh; S/P EVH  . Insert / replace / remove pacemaker 1979; 1992; 01/2000;  . Insert / replace / remove pacemaker 09/2003; 06/2006    w/AICD  . Insert / replace / remove pacemaker 12/2004    pacmaker explant  . Shoulder arthroscopy w/ rotator cuff repair 05/2008    left    Family History  Problem Relation Age of Onset  . Alzheimer's disease Father 26  . Cancer  Father 25    metastatic prostate cancer  . Diabetes Sister 74  . Diabetes Brother   . Diabetes Brother   . Diabetes Brother   . Hypotension Neg Hx   . Malignant hyperthermia Neg Hx   . Pseudochol deficiency Neg Hx     History  Substance Use Topics  . Smoking status: Former Smoker    Types: Cigarettes    Quit date: 03/12/1949  . Smokeless tobacco: Current User    Types: Chew  . Alcohol Use: No     drank until 7 mos ago, per pt 03/19/11      Review of Systems  Constitutional: Negative for fever, chills, malaise/fatigue, diaphoresis, activity change and appetite change.  HENT: Positive for facial swelling. Negative for  ear pain, nosebleeds, congestion, rhinorrhea, trouble swallowing, neck stiffness, dental problem, postnasal drip and tinnitus.   Eyes: Negative for pain, redness and visual disturbance.  Respiratory: Negative for shortness of breath.   Cardiovascular: Negative for palpitations, orthopnea, syncope and near-syncope.  Gastrointestinal: Negative for nausea, vomiting, abdominal pain, diarrhea and anorexia.  Genitourinary: Negative.   Musculoskeletal: Positive for back pain and arthralgias. Negative for myalgias, joint swelling and gait problem.  Skin: Negative for color change, pallor, rash and wound.  Neurological: Positive for dizziness and headaches. Negative for seizures, syncope, facial asymmetry, weakness and light-headedness.  Hematological: Negative for adenopathy. Does not bruise/bleed easily.  Psychiatric/Behavioral: Negative.     Allergies  Aspirin  Home Medications   Current Outpatient Rx  Name Route Sig Dispense Refill  . ALLOPURINOL 300 MG PO TABS Oral Take 300 mg by mouth daily.      . ATORVASTATIN CALCIUM 20 MG PO TABS Oral Take 20 mg by mouth daily.      [name] CALCIUM-VITAMIN D PO Oral Take 1 tablet by mouth daily.     [name] VITAMIN B-12 PO Oral Take 1 tablet by mouth 2 (two) times daily.     [name] DIGOXIN 0.125 MG PO TABS Oral Take 125 mcg by mouth daily.      . OMEGA-3 FATTY ACIDS 1000 MG PO CAPS Oral Take 1 g by mouth 2 (two) times daily.     . FUROSEMIDE 20 MG PO TABS Oral Take 20 mg by mouth every morning.     . ISOSORBIDE MONONITRATE ER 30 MG PO TB24 Oral Take 30 mg by mouth daily.      [name] LISINOPRIL 10 MG PO TABS Oral Take 10 mg by mouth every morning.     [name] METOPROLOL SUCCINATE ER 25 MG PO TB24 Oral Take 25 mg by mouth every morning.    [name] OMEPRAZOLE 20 MG PO CPDR Oral Take 20 mg by mouth every morning.     [name] POTASSIUM CHLORIDE CRYS ER 20 MEQ PO TBCR Oral Take 20 mEq by mouth 2 (two) times daily.    . WARFARIN SODIUM 2.5 MG PO TABS Oral Take 5-7.5 mg by mouth every evening.  Takes 2 tablets on Monday, Wednesday, Friday, Saturday, and Sunday and 3 tablets on Tuesday and Thursday. Patient states he takes the coumadin in the morning      BP 155/84  Pulse 64  Temp 97 F (36.1 C) (Oral)  Resp 24  SpO2 100%  Physical Exam  Constitutional: He is oriented to person, place, and time. He appears well-developed and well-nourished.  Non-toxic appearance. He does not have a sickly appearance. He does not appear ill. He is not intubated.  HENT:  Head: Normocephalic. Not macrocephalic and not  microcephalic. Head is with contusion. Head is without raccoon's eyes, without Battle's sign, without abrasion, without laceration, without right periorbital erythema and without left periorbital erythema. Hair is normal. No trismus in the jaw.    Right Ear: Hearing, tympanic membrane, external ear and ear canal normal. No lacerations. No drainage or tenderness. No foreign bodies. No mastoid tenderness. Tympanic membrane is not bulging. No middle ear effusion. No hemotympanum.  Left Ear: Hearing, tympanic membrane, external ear and ear canal normal. No lacerations. No drainage or tenderness. No foreign bodies. No mastoid tenderness. Tympanic membrane is not bulging.  No middle ear effusion. No hemotympanum.  Nose: Nose normal. No mucosal edema, rhinorrhea, nose lacerations, sinus tenderness or nasal septal hematoma. Right sinus exhibits no maxillary sinus tenderness and no frontal sinus tenderness. Left sinus exhibits no maxillary sinus tenderness and no frontal sinus tenderness.  Mouth/Throat: Uvula is midline, oropharynx is clear and moist and mucous membranes are normal. Mucous membranes are not pale, not dry and not cyanotic. No oropharyngeal exudate, posterior oropharyngeal edema or tonsillar abscesses.       edentulous  Eyes: Conjunctivae normal and EOM are normal. Right eye exhibits no discharge and no exudate. Left eye exhibits no discharge and no exudate. Right conjunctiva is not  injected. Right conjunctiva has no hemorrhage. Left conjunctiva is not injected. Left conjunctiva has no hemorrhage. Right eye exhibits normal extraocular motion and no nystagmus. Left eye exhibits normal extraocular motion and no nystagmus. Right pupil is round and reactive. Left pupil is round and reactive. Pupils are equal.  Neck: Trachea normal, normal range of motion, full passive range of motion without pain and phonation normal. Neck supple. No JVD present. No tracheal tenderness, no spinous process tenderness and no muscular tenderness present. Carotid bruit is not present. No rigidity. No tracheal deviation, no edema, no erythema and normal range of motion present.  Cardiovascular: Normal rate, regular rhythm, intact distal pulses and normal pulses.   No extrasystoles are present. PMI is not displaced.  Exam reveals no decreased pulses.   Murmur heard.  Systolic murmur is present with a grade of 2/6  Pulmonary/Chest: Breath sounds normal. No accessory muscle usage or stridor. No apnea, not tachypneic and not bradypneic. He is not intubated. No respiratory distress. He has no decreased breath sounds. He has no wheezes. He has no rhonchi. He has no rales. He exhibits no tenderness, no bony tenderness, no crepitus, no edema and no retraction.  Abdominal: Soft. Normal appearance and bowel sounds are normal. He exhibits no distension, no ascites, no pulsatile midline mass and no mass. There is no tenderness. There is no rigidity, no rebound, no guarding, no CVA tenderness, no tenderness at McBurney's point and negative Murphy's sign. No hernia.  Neurological: He is alert and oriented to person, place, and time. He displays no atrophy and no tremor. No cranial nerve deficit. He exhibits normal muscle tone. He displays no seizure activity. Coordination normal. GCS eye subscore is 4. GCS verbal subscore is 5. GCS motor subscore is 6.  Skin: Skin is warm. No rash noted. He is not diaphoretic. No erythema. No  pallor.  Psychiatric: He has a normal mood and affect. His behavior is normal.    ED Course  Procedures (including critical care time)  Labs Reviewed - No data to display No results found.   No diagnosis found.    MDM  Pt presents s/p a fall on 9/20.  He has bruising around his right eye.  He also takes  coumadin and reports a lingering headache and some dizziness.  Will obtain a CT scan of orbits and head, as well as a PT/INR.   1250.  Pt stble, NAD.  CT negative for ICH or acute bony traumatic injury.  He is A&O.  He describes a mechanical fall.  Although he does report carotid artery disease, has no clinical evidence of CVA.  Plan discharge home to f/u with his PM<D and cardiologist.  Discussed indications for immediate return to the emergency department.     Tobin Chad, MD 12/06/11 1300

## 2011-12-06 NOTE — ED Notes (Signed)
Pt reporting headache since Sunday, on left side of head. Pt fell last Friday, hit right eye on bathtub. Pain 6/10. Pt a x 4. Denying any vision. Pt ambulates independently.

## 2011-12-12 ENCOUNTER — Other Ambulatory Visit: Payer: Self-pay | Admitting: Cardiology

## 2011-12-12 MED ORDER — POTASSIUM CHLORIDE CRYS ER 20 MEQ PO TBCR: 20.0000 meq | EXTENDED_RELEASE_TABLET | Freq: Two times a day (BID) | ORAL | Status: DC

## 2011-12-14 ENCOUNTER — Encounter: Payer: Self-pay | Admitting: *Deleted

## 2011-12-18 ENCOUNTER — Other Ambulatory Visit (HOSPITAL_COMMUNITY): Payer: Self-pay | Admitting: Pain Medicine

## 2011-12-18 DIAGNOSIS — M542 Cervicalgia: Secondary | ICD-10-CM

## 2011-12-21 ENCOUNTER — Encounter: Payer: Self-pay | Admitting: Internal Medicine

## 2011-12-21 ENCOUNTER — Ambulatory Visit (HOSPITAL_COMMUNITY)
Admission: RE | Admit: 2011-12-21 | Discharge: 2011-12-21 | Disposition: A | Discharge status: Home / Self Care | Payer: PRIVATE HEALTH INSURANCE | Source: Ambulatory Visit | Attending: Pain Medicine | Admitting: Pain Medicine

## 2011-12-21 ENCOUNTER — Ambulatory Visit (INDEPENDENT_AMBULATORY_CARE_PROVIDER_SITE_OTHER): Payer: PRIVATE HEALTH INSURANCE | Admitting: Internal Medicine

## 2011-12-21 VITALS — BP 120/64 | HR 64 | Ht 66.0 in | Wt 153.0 lb

## 2011-12-21 DIAGNOSIS — R55 Syncope and collapse: Secondary | ICD-10-CM | POA: Insufficient documentation

## 2011-12-21 DIAGNOSIS — I5022 Chronic systolic (congestive) heart failure: Secondary | ICD-10-CM

## 2011-12-21 DIAGNOSIS — M479 Spondylosis, unspecified: Secondary | ICD-10-CM | POA: Insufficient documentation

## 2011-12-21 DIAGNOSIS — Z95810 Presence of automatic (implantable) cardiac defibrillator: Secondary | ICD-10-CM

## 2011-12-21 DIAGNOSIS — I509 Heart failure, unspecified: Secondary | ICD-10-CM

## 2011-12-21 DIAGNOSIS — M542 Cervicalgia: Secondary | ICD-10-CM | POA: Insufficient documentation

## 2011-12-21 DIAGNOSIS — I4891 Unspecified atrial fibrillation: Secondary | ICD-10-CM

## 2011-12-21 LAB — ICD DEVICE OBSERVATION
ATRIAL PACING ICD: 0 pct
HV IMPEDENCE: 38 Ohm
LV LEAD IMPEDENCE ICD: 350 Ohm
RV LEAD IMPEDENCE ICD: 487.5 Ohm
TOT-0008: 0
TOT-0009: 1
TOT-0010: 33
TZAT-0001FASTVT: 1
TZAT-0001SLOWVT: 1
TZAT-0012SLOWVT: 200 ms
TZAT-0013SLOWVT: 3
TZAT-0019FASTVT: 7.5 V
TZAT-0020FASTVT: 1 ms
TZAT-0020SLOWVT: 1 ms
TZON-0004FASTVT: 12
TZON-0005FASTVT: 6
TZON-0005SLOWVT: 6
TZON-0010FASTVT: 80 ms
TZST-0001FASTVT: 2
TZST-0001FASTVT: 5
TZST-0001SLOWVT: 2
TZST-0001SLOWVT: 4
TZST-0001SLOWVT: 5
TZST-0003FASTVT: 25 J
TZST-0003FASTVT: 36 J
TZST-0003FASTVT: 36 J
TZST-0003SLOWVT: 15 J
TZST-0003SLOWVT: 25 J
VENTRICULAR PACING ICD: 94 pct
VF: 0

## 2011-12-21 NOTE — Assessment & Plan Note (Signed)
His ventricular rate appears to be well-controlled. He will continue his current medical therapy. 

## 2011-12-21 NOTE — Patient Instructions (Signed)
Your physician recommends that you schedule a follow-up appointment in: 3 months in the device clinic and 12 months with Dr Taylor  

## 2011-12-21 NOTE — Assessment & Plan Note (Signed)
His symptoms are class II. He will continue his current medical therapy, maintain a low-sodium diet.

## 2011-12-21 NOTE — Assessment & Plan Note (Signed)
His [employer] biventricular ICD appears to be working normally. We'll plan to recheck in several months.

## 2011-12-21 NOTE — Progress Notes (Signed)
HPI [name] returns today for followup. He is a 78 year old man with an ischemic cardiomyopathy, chronic systolic heart failure, symptomatic bradycardia, status post biventricular ICD implantation. He also has chronic atrial fibrillation. In the interim, the patient has been stable. He denies any recent ICD shock, chest pain, syncope, or peripheral edema. Allergies  Allergen Reactions  . Aspirin Hives     Current Outpatient Prescriptions  Medication Sig Dispense Refill  . allopurinol (ZYLOPRIM) 300 MG tablet Take 300 mg by mouth daily.        [name] atorvastatin (LIPITOR) 20 MG tablet Take 20 mg by mouth daily.        [name] CALCIUM-VITAMIN D PO Take 1 tablet by mouth daily.       . Cyanocobalamin (VITAMIN B-12 PO) Take 1 tablet by mouth 2 (two) times daily.       . digoxin (LANOXIN) 0.125 MG tablet Take 125 mcg by mouth daily.        . fish oil-omega-3 fatty acids 1000 MG capsule Take 1 g by mouth 2 (two) times daily.       . furosemide (LASIX) 20 MG tablet Take 20 mg by mouth every morning.       . isosorbide mononitrate (IMDUR) 30 MG 24 hr tablet Take 30 mg by mouth daily.        [name] lisinopril (PRINIVIL,ZESTRIL) 10 MG tablet Take 10 mg by mouth every morning.       . metoprolol succinate (TOPROL-XL) 25 MG 24 hr tablet Take 25 mg by mouth every morning.      [name] NITROSTAT 0.4 MG SL tablet as needed.      [name] omeprazole (PRILOSEC) 20 MG capsule Take 20 mg by mouth every morning.       [name] oxycodone (OXY-IR) 5 MG capsule as needed.      . potassium chloride SA (K-DUR,KLOR-CON) 20 MEQ tablet Take 1 tablet (20 mEq total) by mouth 2 (two) times daily.  60 tablet  0  . warfarin (COUMADIN) 2.5 MG tablet Take 5-7.5 mg by mouth every evening. Takes 2 tablets on Monday, Wednesday, Friday, Saturday, and Sunday and 3 tablets on Tuesday and Thursday. Patient states he takes the coumadin in the morning         Past Medical History  Diagnosis Date  . Diabetes mellitus   . Hypertension   . CHF (congestive heart  failure)   . S/P CABG (coronary artery bypass graft)   . Gout   . Nephrolithiasis   . Prostate cancer, recur risk not determined whether low, med or high   . Coronary artery disease   . Hypercholesterolemia   . Black lung disease   . Kidney stone 09/2000  . Angina   . MI (myocardial infarction)     "I've had 2; didn't know it"  . CVA (cerebral vascular accident)     "I've had 2; didn't know it"  . Bradycardia, sinus   . On home oxygen therapy   . Prostate cancer 11/01/10    gleason 7, 8, 9, gold seeds 02/08/11  . Lung cancer   . COPD (chronic obstructive pulmonary disease)   . Shortness of breath   . GERD (gastroesophageal reflux disease)   . AICD (automatic cardioverter/defibrillator) present   . Arthritis     ROS:   All systems reviewed and negative except as noted in the HPI.   Past Surgical History  Procedure Date  . Colonoscopy   . Esophagogastroduodenoscopy 02/19/2011    Procedure:  ESOPHAGOGASTRODUODENOSCOPY (EGD);  Surgeon: Petra Kuba, MD;  Location: [facility] ENDOSCOPY;  Service: Endoscopy;  Laterality: N/A;  . Upper endoscpopy   . Coronary angioplasty with stent placement 07/1997; 08/1997;03/1998  . Coronary artery bypass graft 01/2000    CABG  X2  . Coronary artery bypass graft 07/2002    CABG X3  . Incision and drainage of wound 08/2002    right thigh; S/P EVH  . Insert / replace / remove pacemaker 1979; 1992; 01/2000;  . Insert / replace / remove pacemaker 09/2003; 06/2006    w/AICD  . Insert / replace / remove pacemaker 12/2004    pacmaker explant  . Shoulder arthroscopy w/ rotator cuff repair 05/2008    left     Family History  Problem Relation Age of Onset  . Alzheimer's disease Father 71  . Cancer Father 51    metastatic prostate cancer  . Diabetes Sister 65  . Diabetes Brother   . Diabetes Brother   . Diabetes Brother   . Hypotension Neg Hx   . Malignant hyperthermia Neg Hx   . Pseudochol deficiency Neg Hx      History   Social History  .  Marital Status: Widowed    Spouse Name: N/A    Number of Children: N/A  . Years of Education: N/A   Occupational History  . retired     Medical laboratory scientific officer   Social History Main Topics  . Smoking status: Former Smoker    Types: Cigarettes    Quit date: 03/12/1949  . Smokeless tobacco: Current User    Types: Chew  . Alcohol Use: No     drank until 7 mos ago, per pt 03/19/11  . Drug Use: No  . Sexually Active: Yes   Other Topics Concern  . Not on file   Social History Narrative  . No narrative on file     BP 120/64  Pulse 64  Ht 5\' 6"  (1.676 m)  Wt 153 lb (69.4 kg)  BMI 24.69 kg/m2  Physical Exam:  Well appearing elderly man, NAD HEENT: Unremarkable Neck:  7 cm JVD, no thyromegally Lungs:  Clear with no wheezes, rales, or rhonchi. HEART:  Regular rate rhythm, 2/6 systolic murmur, no rubs, no clicks Abd:  soft, positive bowel sounds, no organomegally, no rebound, no guarding Ext:  2 plus pulses, no edema, no cyanosis, no clubbing Skin:  No rashes no nodules Neuro:  CN II through XII intact, motor grossly intact  DEVICE  Normal device function.  See PaceArt for details.   Assess/Plan:

## 2012-01-09 ENCOUNTER — Encounter (HOSPITAL_COMMUNITY): Payer: Self-pay | Admitting: *Deleted

## 2012-01-09 ENCOUNTER — Emergency Department (HOSPITAL_COMMUNITY)
Admission: EM | Admit: 2012-01-09 | Discharge: 2012-01-10 | Disposition: A | Discharge status: Home / Self Care | Payer: PRIVATE HEALTH INSURANCE | Attending: Emergency Medicine | Admitting: Emergency Medicine

## 2012-01-09 DIAGNOSIS — Z7901 Long term (current) use of anticoagulants: Secondary | ICD-10-CM | POA: Insufficient documentation

## 2012-01-09 DIAGNOSIS — R109 Unspecified abdominal pain: Secondary | ICD-10-CM | POA: Insufficient documentation

## 2012-01-09 DIAGNOSIS — I10 Essential (primary) hypertension: Secondary | ICD-10-CM | POA: Insufficient documentation

## 2012-01-09 DIAGNOSIS — J449 Chronic obstructive pulmonary disease, unspecified: Secondary | ICD-10-CM | POA: Insufficient documentation

## 2012-01-09 DIAGNOSIS — E7800 Pure hypercholesterolemia, unspecified: Secondary | ICD-10-CM | POA: Insufficient documentation

## 2012-01-09 DIAGNOSIS — I2510 Atherosclerotic heart disease of native coronary artery without angina pectoris: Secondary | ICD-10-CM | POA: Insufficient documentation

## 2012-01-09 DIAGNOSIS — Z8673 Personal history of transient ischemic attack (TIA), and cerebral infarction without residual deficits: Secondary | ICD-10-CM | POA: Insufficient documentation

## 2012-01-09 DIAGNOSIS — M129 Arthropathy, unspecified: Secondary | ICD-10-CM | POA: Insufficient documentation

## 2012-01-09 DIAGNOSIS — K219 Gastro-esophageal reflux disease without esophagitis: Secondary | ICD-10-CM | POA: Insufficient documentation

## 2012-01-09 DIAGNOSIS — C61 Malignant neoplasm of prostate: Secondary | ICD-10-CM | POA: Insufficient documentation

## 2012-01-09 DIAGNOSIS — J4489 Other specified chronic obstructive pulmonary disease: Secondary | ICD-10-CM | POA: Insufficient documentation

## 2012-01-09 DIAGNOSIS — E119 Type 2 diabetes mellitus without complications: Secondary | ICD-10-CM | POA: Insufficient documentation

## 2012-01-09 DIAGNOSIS — C7800 Secondary malignant neoplasm of unspecified lung: Secondary | ICD-10-CM | POA: Insufficient documentation

## 2012-01-09 DIAGNOSIS — I252 Old myocardial infarction: Secondary | ICD-10-CM | POA: Insufficient documentation

## 2012-01-09 DIAGNOSIS — K5900 Constipation, unspecified: Secondary | ICD-10-CM | POA: Insufficient documentation

## 2012-01-09 DIAGNOSIS — I509 Heart failure, unspecified: Secondary | ICD-10-CM | POA: Insufficient documentation

## 2012-01-09 DIAGNOSIS — M109 Gout, unspecified: Secondary | ICD-10-CM | POA: Insufficient documentation

## 2012-01-09 DIAGNOSIS — Z951 Presence of aortocoronary bypass graft: Secondary | ICD-10-CM | POA: Insufficient documentation

## 2012-01-09 DIAGNOSIS — Z79899 Other long term (current) drug therapy: Secondary | ICD-10-CM | POA: Insufficient documentation

## 2012-01-09 DIAGNOSIS — Z95810 Presence of automatic (implantable) cardiac defibrillator: Secondary | ICD-10-CM | POA: Insufficient documentation

## 2012-01-09 DIAGNOSIS — Z87891 Personal history of nicotine dependence: Secondary | ICD-10-CM | POA: Insufficient documentation

## 2012-01-09 LAB — URINALYSIS, ROUTINE W REFLEX MICROSCOPIC
Bilirubin Urine: NEGATIVE
Glucose, UA: NEGATIVE mg/dL
Hgb urine dipstick: NEGATIVE
Ketones, ur: NEGATIVE mg/dL
Specific Gravity, Urine: 1.019 (ref 1.005–1.030)
pH: 5 (ref 5.0–8.0)

## 2012-01-09 LAB — COMPREHENSIVE METABOLIC PANEL
ALT: 30 U/L (ref 0–53)
BUN: 13 mg/dL (ref 6–23)
CO2: 30 mEq/L (ref 19–32)
Calcium: 10.5 mg/dL (ref 8.4–10.5)
Creatinine, Ser: 1.1 mg/dL (ref 0.50–1.35)
GFR calc Af Amer: 73 mL/min — ABNORMAL LOW (ref >90)
GFR calc non Af Amer: 63 mL/min — ABNORMAL LOW (ref >90)
Glucose, Bld: 117 mg/dL — ABNORMAL HIGH (ref 70–99)
Sodium: 140 mEq/L (ref 135–145)
Total Protein: 7.9 g/dL (ref 6.0–8.3)

## 2012-01-09 LAB — CBC
HCT: 34.9 % — ABNORMAL LOW (ref 39.0–52.0)
Hemoglobin: 11.8 g/dL — ABNORMAL LOW (ref 13.0–17.0)
MCH: 32.6 pg (ref 26.0–34.0)
MCHC: 33.8 g/dL (ref 30.0–36.0)
MCV: 96.4 fL (ref 78.0–100.0)
RBC: 3.62 MIL/uL — ABNORMAL LOW (ref 4.22–5.81)

## 2012-01-09 NOTE — ED Notes (Addendum)
Patient with bilateral flank pain that started this evening.  Pain is more on the right then the left.  Patient experienced N/V prior to arrival.  Patient denies any problem moving his bowels.  Negative rebound tenderness and patient states that pain seems to get worse when he lays down.

## 2012-01-10 ENCOUNTER — Emergency Department (HOSPITAL_COMMUNITY): Payer: PRIVATE HEALTH INSURANCE

## 2012-01-10 ENCOUNTER — Encounter (HOSPITAL_COMMUNITY): Payer: Self-pay | Admitting: Radiology

## 2012-01-10 LAB — PROTIME-INR: Prothrombin Time: 27.9 seconds — ABNORMAL HIGH (ref 11.6–15.2)

## 2012-01-10 MED ORDER — POLYETHYLENE GLYCOL 3350 17 G PO PACK: 17.0000 g | PACK | Freq: Every day | ORAL | Status: DC

## 2012-01-10 NOTE — ED Notes (Signed)
Pt reports rt side pain that began today - pt admits to normal BM, x2 today. Pt denies hematuria or fever. Pt pleasant on assessment, in no acute distress. A&Ox4

## 2012-01-10 NOTE — ED Notes (Signed)
Rx given x1 Pt ambulating independently w/ steady gait on d/c in no acute distress, A&Ox4. D/c instructions reviewed w/ pt and family - pt and family deny any further questions or concerns at present.  

## 2012-01-10 NOTE — ED Provider Notes (Signed)
History     CSN: [id number]  Arrival date & time 01/09/12  1925   First MD Initiated Contact with Patient 01/09/12 2356      Chief Complaint  Patient presents with  . Flank Pain    (Consider location/radiation/quality/duration/timing/severity/associated sxs/prior treatment) HPI 78 yo male presents to the ER with complaint of bilateral flank pain starting tonight while lying in bed watching TV.  Pain worse on right.  He denies any n/v/d, having normal bowel movements.  Pt with h/o kidney stone, reports pain feels like his stone pain.  Pain is a "stinging" feeling.  No chest pain, no sob.  No missed medications.  No fevers.     Past Medical History  Diagnosis Date  . Diabetes mellitus   . Hypertension   . CHF (congestive heart failure)   . S/P CABG (coronary artery bypass graft)   . Gout   . Nephrolithiasis   . Prostate cancer, recur risk not determined whether low, med or high   . Coronary artery disease   . Hypercholesterolemia   . Black lung disease   . Kidney stone 09/2000  . Angina   . MI (myocardial infarction)     "I've had 2; didn't know it"  . CVA (cerebral vascular accident)     "I've had 2; didn't know it"  . Bradycardia, sinus   . On home oxygen therapy   . Prostate cancer 11/01/10    gleason 7, 8, 9, gold seeds 02/08/11  . Lung cancer   . COPD (chronic obstructive pulmonary disease)   . Shortness of breath   . GERD (gastroesophageal reflux disease)   . AICD (automatic cardioverter/defibrillator) present   . Arthritis     Past Surgical History  Procedure Date  . Colonoscopy   . Esophagogastroduodenoscopy 02/19/2011    Procedure: ESOPHAGOGASTRODUODENOSCOPY (EGD);  Surgeon: Petra Kuba, MD;  Location: [facility] ENDOSCOPY;  Service: Endoscopy;  Laterality: N/A;  . Upper endoscpopy   . Coronary angioplasty with stent placement 07/1997; 08/1997;03/1998  . Coronary artery bypass graft 01/2000    CABG  X2  . Coronary artery bypass graft 07/2002    CABG X3  .  Incision and drainage of wound 08/2002    right thigh; S/P EVH  . Insert / replace / remove pacemaker 1979; 1992; 01/2000;  . Insert / replace / remove pacemaker 09/2003; 06/2006    w/AICD  . Insert / replace / remove pacemaker 12/2004    pacmaker explant  . Shoulder arthroscopy w/ rotator cuff repair 05/2008    left    Family History  Problem Relation Age of Onset  . Alzheimer's disease Father 45  . Cancer Father 72    metastatic prostate cancer  . Diabetes Sister 34  . Diabetes Brother   . Diabetes Brother   . Diabetes Brother   . Hypotension Neg Hx   . Malignant hyperthermia Neg Hx   . Pseudochol deficiency Neg Hx     History  Substance Use Topics  . Smoking status: Former Smoker    Types: Cigarettes    Quit date: 03/12/1949  . Smokeless tobacco: Current User    Types: Chew  . Alcohol Use: No     drank until 7 mos ago, per pt 03/19/11      Review of Systems  All other systems reviewed and are negative.    Allergies  Aspirin  Home Medications   Current Outpatient Rx  Name Route Sig Dispense Refill  . ALLOPURINOL 300 MG  PO TABS Oral Take 300 mg by mouth daily.      . ATORVASTATIN CALCIUM 20 MG PO TABS Oral Take 20 mg by mouth daily.      [name] CALCIUM-VITAMIN D PO Oral Take 1 tablet by mouth daily.     [name] VITAMIN B-12 PO Oral Take 1 tablet by mouth 2 (two) times daily.     [name] DIGOXIN 0.125 MG PO TABS Oral Take 125 mcg by mouth daily.      . OMEGA-3 FATTY ACIDS 1000 MG PO CAPS Oral Take 1 g by mouth 2 (two) times daily.     . FUROSEMIDE 20 MG PO TABS Oral Take 20 mg by mouth every morning.     . ISOSORBIDE MONONITRATE ER 30 MG PO TB24 Oral Take 30 mg by mouth daily.      [name] LISINOPRIL 10 MG PO TABS Oral Take 10 mg by mouth every morning.     [name] METOPROLOL SUCCINATE ER 25 MG PO TB24 Oral Take 25 mg by mouth every morning.    [name] OMEPRAZOLE 20 MG PO CPDR Oral Take 20 mg by mouth every morning.     . OXYCODONE HCL 5 MG PO CAPS Oral Take 5 mg by mouth every 4 (four) hours as  needed. For pain    . POTASSIUM CHLORIDE CRYS ER 20 MEQ PO TBCR Oral Take 1 tablet (20 mEq total) by mouth 2 (two) times daily. 60 tablet 0  . WARFARIN SODIUM 2.5 MG PO TABS Oral Take 5-7.5 mg by mouth every evening. Takes 2 tablets on Monday, Wednesday, Friday, Saturday, and Sunday and 3 tablets on Tuesday and Thursday. Patient states he takes the coumadin in the morning    . NITROSTAT 0.4 MG SL SUBL  as needed.    [name] POLYETHYLENE GLYCOL 3350 PO PACK Oral Take 17 g by mouth daily. 14 each 0    BP 100/58  Pulse 65  Temp 97.7 F (36.5 C) (Oral)  Resp 18  SpO2 100%  Physical Exam  Nursing note and vitals reviewed. Constitutional: He is oriented to person, place, and time. He appears well-developed and well-nourished.  HENT:  Head: Normocephalic and atraumatic.  Nose: Nose normal.  Mouth/Throat: Oropharynx is clear and moist.  Eyes: Conjunctivae normal and EOM are normal. Pupils are equal, round, and reactive to light.  Neck: Normal range of motion. Neck supple. No JVD present. No tracheal deviation present. No thyromegaly present.  Cardiovascular: Normal rate, regular rhythm, normal heart sounds and intact distal pulses.  Exam reveals no gallop and no friction rub.   No murmur heard. Pulmonary/Chest: Effort normal and breath sounds normal. No stridor. No respiratory distress. He has no wheezes. He has no rales. He exhibits no tenderness.  Abdominal: Soft. Bowel sounds are normal. He exhibits no distension and no mass. There is no tenderness. There is no rebound and no guarding.  Musculoskeletal: Normal range of motion. He exhibits no edema and no tenderness.  Lymphadenopathy:    He has no cervical adenopathy.  Neurological: He is alert and oriented to person, place, and time. He exhibits normal muscle tone. Coordination normal.  Skin: Skin is warm and dry. No rash noted. No erythema. No pallor.  Psychiatric: He has a normal mood and affect. His behavior is normal. Judgment and thought  content normal.    ED Course  Procedures (including critical care time)  Labs Reviewed  CBC - Abnormal; Notable for the following:    RBC 3.62 (*)  Hemoglobin 11.8 (*)     HCT 34.9 (*)     RDW 15.6 (*)     Platelets 147 (*)     All other components within normal limits  COMPREHENSIVE METABOLIC PANEL - Abnormal; Notable for the following:    Potassium 5.2 (*)     Glucose, Bld 117 (*)     GFR calc non Af Amer 63 (*)     GFR calc Af Amer 73 (*)     All other components within normal limits  PROTIME-INR - Abnormal; Notable for the following:    Prothrombin Time 27.9 (*)     INR 2.77 (*)     All other components within normal limits  URINALYSIS, ROUTINE W REFLEX MICROSCOPIC   Ct Abdomen Pelvis Wo Contrast  01/10/2012  *RADIOLOGY REPORT*  Clinical Data: Bilateral flank pain.  Nausea and vomiting.  CT ABDOMEN AND PELVIS WITHOUT CONTRAST  Technique:  Multidetector CT imaging of the abdomen and pelvis was performed following the standard protocol without intravenous contrast.  Comparison: CT abdomen and pelvis 02/19/2011.  Findings: Calcified pleural plaque and pleural thickening in the left lung base are unchanged.  There is no right pleural effusion or pericardial effusion.  Calcification of the left ventricular apex is identified likely due to old infarct.  Lung bases appear emphysematous.  There is cardiomegaly.  A 0.7 cm nonobstructing stone is identified in the lower pole of the right kidney.  Exophytic cyst off the lower pole of the left kidney is noted.  There is no hydronephrosis on the left or right and no ureteral stones are present.  The gallbladder, liver, spleen, adrenal glands and pancreas appear normal.  Fairly large stool burden throughout the colon is noted. The colon is otherwise normal in appearance.  Stomach, small bowel and appendix appear normal.  There is no lymphadenopathy or fluid. Large Schmorl's node versus remote superior endplate compression fracture of L4 is  unchanged.  IMPRESSION:  1.  No acute finding. 2.  0.7 cm nonobstructing stone lower pole right kidney. 3.  Marked cardiomegaly. 4.  Emphysema.  Scar in the left lung base also noted.   Original Report Authenticated By: Bernadene Bell. Maricela Curet, M.D.     Date: 01/10/2012  Rate: 60  Rhythm: paced  QRS Axis: left  Intervals: paced  ST/T Wave abnormalities: indeterminate  Conduction Disutrbances:paced  Narrative Interpretation:   Old EKG Reviewed: unchanged    1. Bilateral flank pain   2. Constipation       MDM  -year-old male 78 year old male with bilateral flank pain. Patient has nephrolithiasis without stone in the ureter. No signs of hyper. Patient with slight elevation in potassium, but no peaked T waves. Patient advised to cut back on his potassium pills to once a day. Do not feel patient has significant intra-abdominal pathology, pain may be due to constipation seen on CT scan        Olivia Mackie, MD 01/10/12 [phone]

## 2012-01-16 ENCOUNTER — Other Ambulatory Visit: Payer: Self-pay | Admitting: Internal Medicine

## 2012-01-16 ENCOUNTER — Other Ambulatory Visit: Payer: Self-pay | Admitting: *Deleted

## 2012-01-16 DIAGNOSIS — I6529 Occlusion and stenosis of unspecified carotid artery: Secondary | ICD-10-CM

## 2012-01-16 MED ORDER — POTASSIUM CHLORIDE CRYS ER 20 MEQ PO TBCR: 20.0000 meq | EXTENDED_RELEASE_TABLET | Freq: Two times a day (BID) | ORAL | Status: DC

## 2012-01-18 ENCOUNTER — Ambulatory Visit
Admission: RE | Admit: 2012-01-18 | Discharge: 2012-01-18 | Disposition: A | Discharge status: Home / Self Care | Payer: PRIVATE HEALTH INSURANCE | Source: Ambulatory Visit | Attending: Internal Medicine | Admitting: Internal Medicine

## 2012-01-18 DIAGNOSIS — I6529 Occlusion and stenosis of unspecified carotid artery: Secondary | ICD-10-CM

## 2012-02-21 ENCOUNTER — Telehealth: Payer: Self-pay | Admitting: Internal Medicine

## 2012-02-21 NOTE — Telephone Encounter (Signed)
lmom last echo was 06/06/10 EF 25%

## 2012-02-21 NOTE — Telephone Encounter (Signed)
New problem:   Need the date of last echo  & ejection fraction. Due to CHF program.

## 2012-02-27 ENCOUNTER — Telehealth: Payer: Self-pay | Admitting: Internal Medicine

## 2012-02-27 NOTE — Telephone Encounter (Signed)
plz return call to [facility] nurse [place] [phone] ext [phone] regarding Ejection Fraction #

## 2012-02-27 NOTE — Telephone Encounter (Signed)
lmtcb ./cy 

## 2012-03-06 NOTE — Telephone Encounter (Signed)
Returned call. They will refax form home monitoring prescription. Mylo Red RN

## 2012-03-06 NOTE — Telephone Encounter (Signed)
New problem:   Fax sent over on 12/22 . Wanted to know if fax was receive.

## 2012-03-14 NOTE — Telephone Encounter (Signed)
Order faxed.

## 2012-03-26 ENCOUNTER — Ambulatory Visit (INDEPENDENT_AMBULATORY_CARE_PROVIDER_SITE_OTHER): Payer: PRIVATE HEALTH INSURANCE | Admitting: *Deleted

## 2012-03-26 ENCOUNTER — Encounter: Payer: Self-pay | Admitting: Internal Medicine

## 2012-03-26 DIAGNOSIS — I5022 Chronic systolic (congestive) heart failure: Secondary | ICD-10-CM

## 2012-03-26 DIAGNOSIS — R55 Syncope and collapse: Secondary | ICD-10-CM

## 2012-03-26 DIAGNOSIS — I509 Heart failure, unspecified: Secondary | ICD-10-CM

## 2012-03-26 DIAGNOSIS — I4891 Unspecified atrial fibrillation: Secondary | ICD-10-CM

## 2012-03-26 LAB — ICD DEVICE OBSERVATION
ATRIAL PACING ICD: 0 pct
BATTERY VOLTAGE: 2.5718 V
FVT: 0
HV IMPEDENCE: 41 Ohm
LV LEAD THRESHOLD: 0.75 V
RV LEAD AMPLITUDE: 11.6 mv
RV LEAD IMPEDENCE ICD: 462.5 Ohm
TOT-0007: 2
TOT-0009: 1
TOT-0010: 34
TZAT-0001FASTVT: 1
TZAT-0001SLOWVT: 1
TZAT-0012SLOWVT: 200 ms
TZAT-0019SLOWVT: 7.5 V
TZAT-0020FASTVT: 1 ms
TZAT-0020SLOWVT: 1 ms
TZON-0004SLOWVT: 12
TZON-0005FASTVT: 6
TZON-0005SLOWVT: 6
TZON-0010SLOWVT: 80 ms
TZST-0001FASTVT: 2
TZST-0001FASTVT: 5
TZST-0001SLOWVT: 4
TZST-0003FASTVT: 25 J
TZST-0003FASTVT: 36 J
TZST-0003SLOWVT: 15 J

## 2012-03-26 NOTE — Progress Notes (Signed)
ICD check 

## 2012-03-26 NOTE — Patient Instructions (Addendum)
Return office visit 06/25/12 @9 :00am with the device clinic.

## 2012-03-29 IMAGING — CT CT ANGIO CHEST
2 of 7 series · 18 of 36 positions shown · IV contrast (agent unspecified)
Comparison: Portable chest x-ray of 08/29/2010

CLINICAL DATA: Left side chest pain, shortness of breath, history
of pulmonary asbestosis

CT ANGIOGRAPHY CHEST WITH CONTRAST
TECHNIQUE: Multidetector CT imaging of the chest was performed
using the standard protocol during bolus administration of
intravenous contrast.  Multiplanar CT image reconstructions
including MIPs were obtained to evaluate the vascular anatomy.
Contrast:  75 ml [id number]

[Series 5: pe thins · axial · 0.75mm/px · z∈[-291,-35]mm · 17 of 289 slices shown]
[im 17/289  lung]
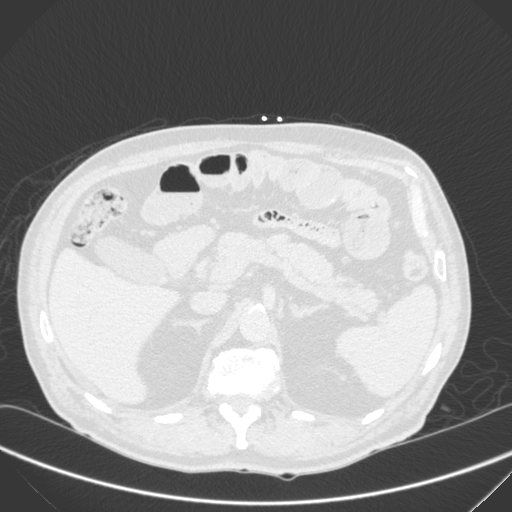
[im 33/289  mediastinal]
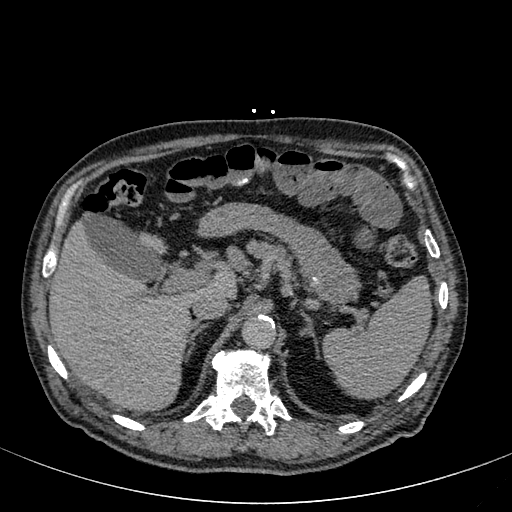
[im 49/289  lung]
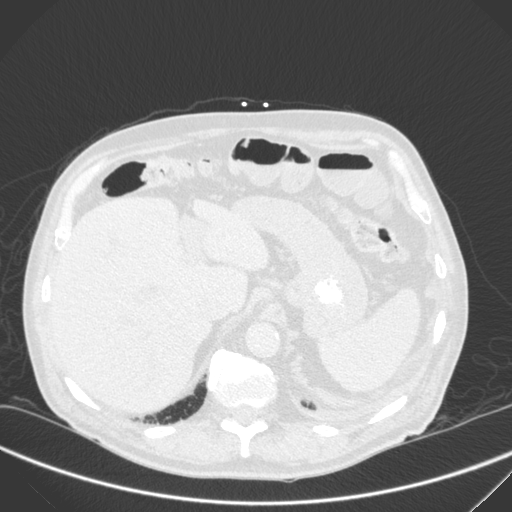
[im 65/289  mediastinal]
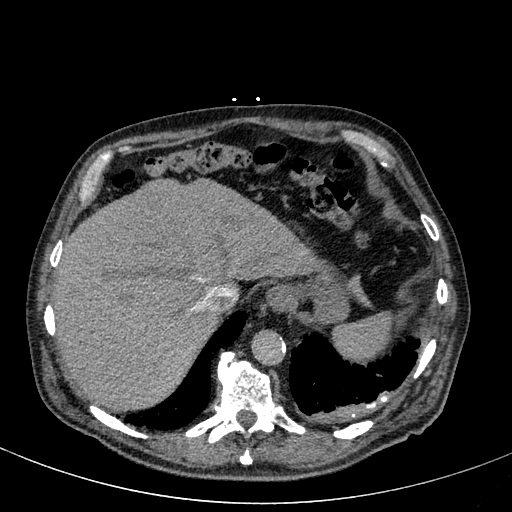
[im 81/289  lung]
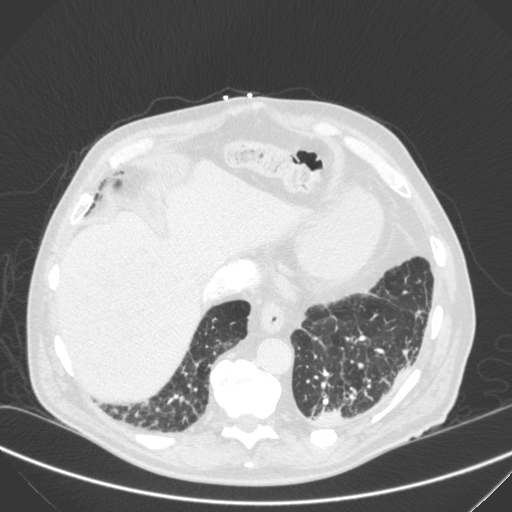
[im 97/289  mediastinal]
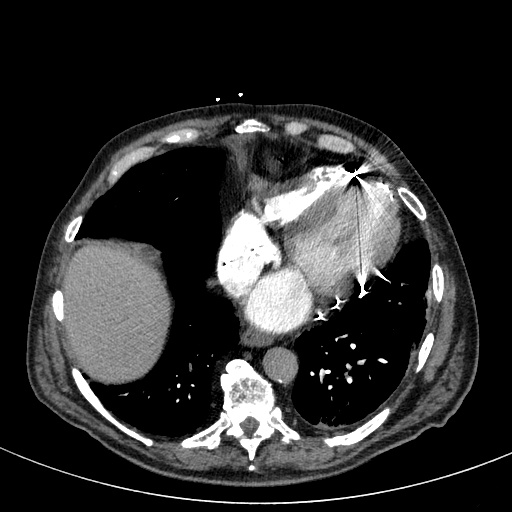
[im 113/289  lung]
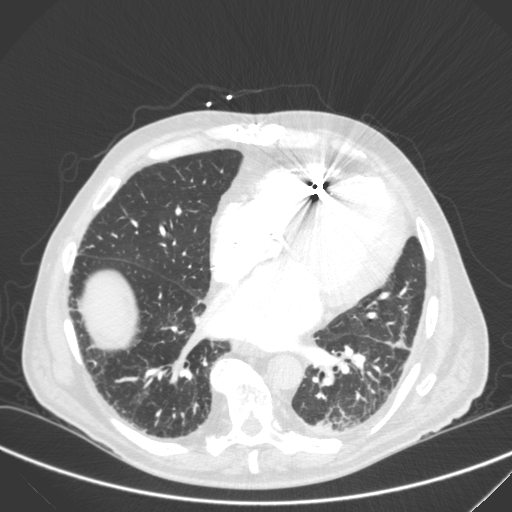
[im 129/289  mediastinal]
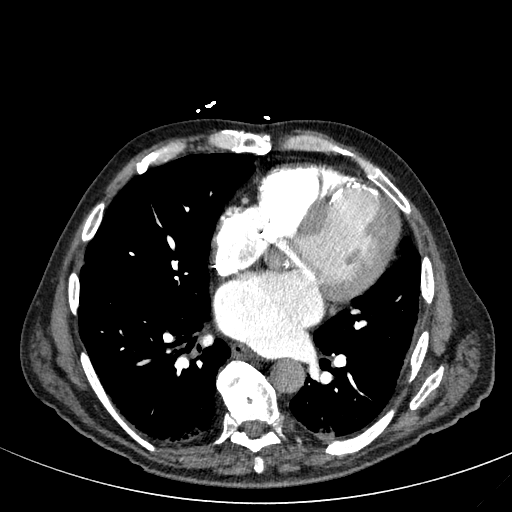
[im 145/289  lung]
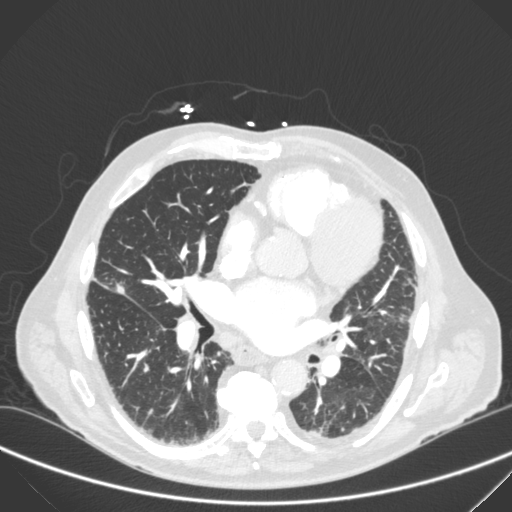
[im 161/289  mediastinal]
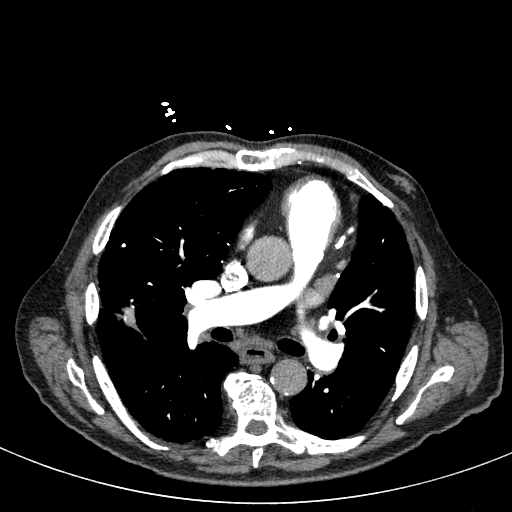
[im 177/289  lung]
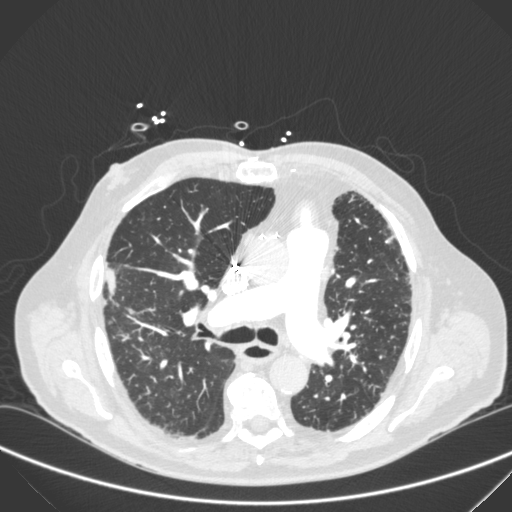
[im 193/289  mediastinal]
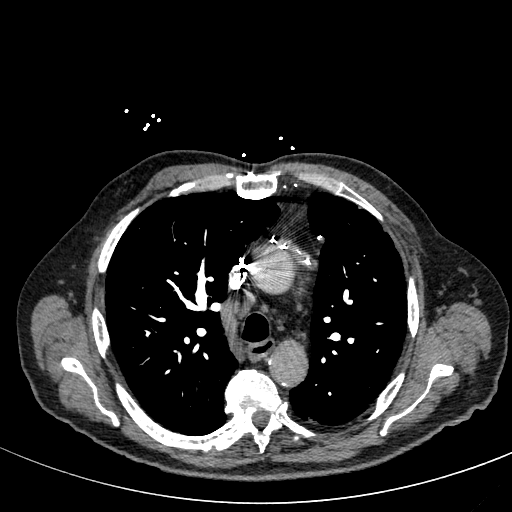
[im 209/289  lung]
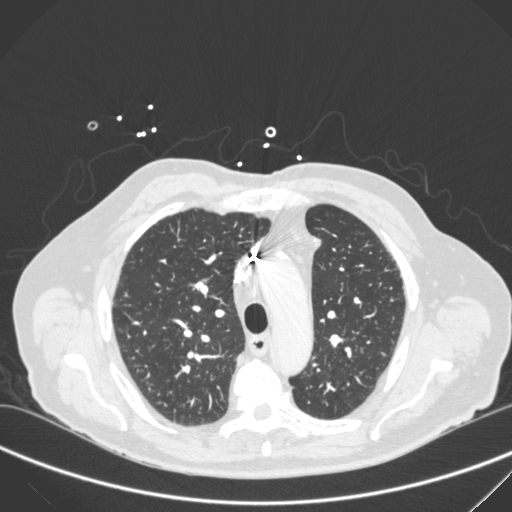
[im 225/289  mediastinal]
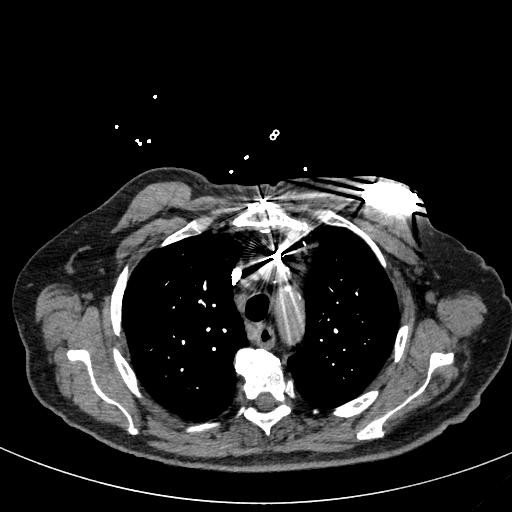
[im 241/289  lung]
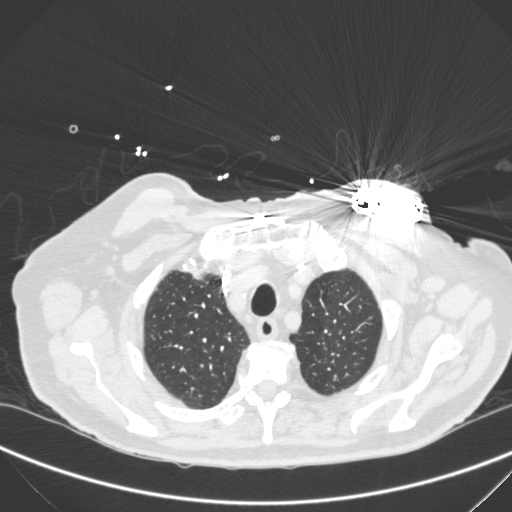
[im 257/289  mediastinal]
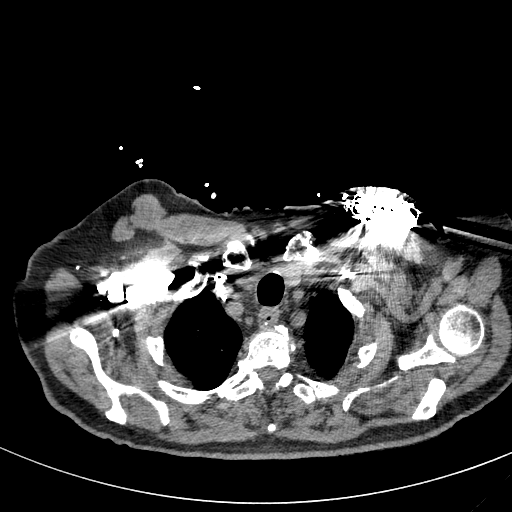
[im 273/289  lung]
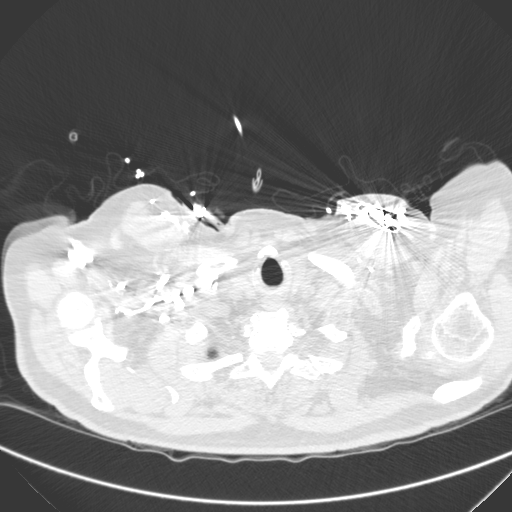

[mpr, coronals, coronal · coronal · 0.75mm/px · 1 of 124 slices shown]
[im 62/124  mediastinal]
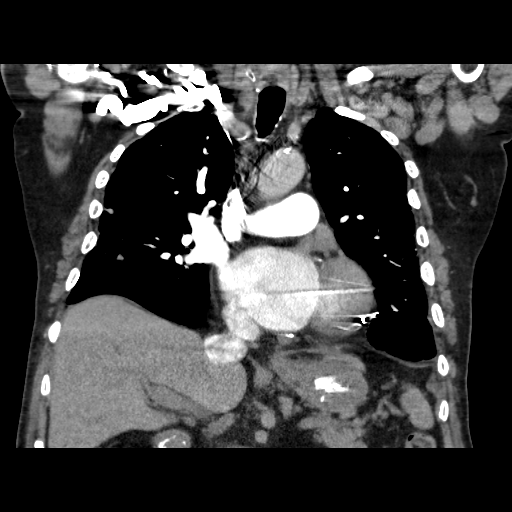

[18 of 36 positions shown; findings below may reference images not displayed]

FINDINGS: The pulmonary arteries opacify well and there is no
evidence of acute pulmonary embolism.  The thoracic aorta is not as
well opacified but no acute abnormality is seen. Cardiomegaly is
present, and there is calcification of the apex of the left
ventricle probably related to a calcified left ventricular
aneurysm.  Small mediastinal nodes are present, with the more
prominent nodes in the anterior prevascular space.  Median
sternotomy sutures are noted from prior CABG.  There is some reflux
of contrast into the IVC and hepatic veins.

On the lung window images, there are changes of asbestosis with
pleural thickening and some calcification of pleural plaques.
There is some thickening of the major and minor fissures on the
right with some nodularity.  This may be related to changes of
asbestosis as well, but follow-up CT chest in 6 months is suggested
to assess stability.  A few calcified granulomas are noted.
Bibasilar dependent atelectasis is present.  There are mild
emphysematous changes noted.  No acute bony abnormality is seen.

Review of the MIP images confirms the above findings.
IMPRESSION: 1.  No evidence of acute pulmonary embolism.
2.  Pleural and parenchymal opacity in the right mid lung
peripherally may represent scarring and changes of asbestosis, with
some pleural plaques which are calcified present as well.
Recommend follow-up CT chest in 6 months to assess stability.
3.  Slightly prominent mediastinal primarily prevascular lymph
nodes.  Again follow-up is recommended to assess stability.
4.  Cardiomegaly with calcified left ventricular apex consistent
with prior LV infarct.

## 2012-04-28 ENCOUNTER — Ambulatory Visit (HOSPITAL_COMMUNITY)
Admission: RE | Admit: 2012-04-28 | Discharge: 2012-04-28 | Disposition: A | Discharge status: Home / Self Care | Payer: PRIVATE HEALTH INSURANCE | Source: Ambulatory Visit | Attending: Pain Medicine | Admitting: Pain Medicine

## 2012-04-28 ENCOUNTER — Other Ambulatory Visit (HOSPITAL_COMMUNITY): Payer: Self-pay | Admitting: Pain Medicine

## 2012-04-28 DIAGNOSIS — M25569 Pain in unspecified knee: Secondary | ICD-10-CM | POA: Insufficient documentation

## 2012-04-28 DIAGNOSIS — M25579 Pain in unspecified ankle and joints of unspecified foot: Secondary | ICD-10-CM | POA: Insufficient documentation

## 2012-04-28 DIAGNOSIS — R52 Pain, unspecified: Secondary | ICD-10-CM

## 2012-04-30 ENCOUNTER — Encounter (HOSPITAL_COMMUNITY): Payer: Self-pay | Admitting: Emergency Medicine

## 2012-04-30 ENCOUNTER — Emergency Department (HOSPITAL_COMMUNITY)
Admission: EM | Admit: 2012-04-30 | Discharge: 2012-05-01 | Disposition: A | Discharge status: Home / Self Care | Payer: PRIVATE HEALTH INSURANCE | Attending: Emergency Medicine | Admitting: Emergency Medicine

## 2012-04-30 DIAGNOSIS — Z9981 Dependence on supplemental oxygen: Secondary | ICD-10-CM | POA: Insufficient documentation

## 2012-04-30 DIAGNOSIS — E119 Type 2 diabetes mellitus without complications: Secondary | ICD-10-CM | POA: Insufficient documentation

## 2012-04-30 DIAGNOSIS — M79609 Pain in unspecified limb: Secondary | ICD-10-CM | POA: Insufficient documentation

## 2012-04-30 DIAGNOSIS — Z8679 Personal history of other diseases of the circulatory system: Secondary | ICD-10-CM | POA: Insufficient documentation

## 2012-04-30 DIAGNOSIS — Z7901 Long term (current) use of anticoagulants: Secondary | ICD-10-CM | POA: Insufficient documentation

## 2012-04-30 DIAGNOSIS — K219 Gastro-esophageal reflux disease without esophagitis: Secondary | ICD-10-CM | POA: Insufficient documentation

## 2012-04-30 DIAGNOSIS — I10 Essential (primary) hypertension: Secondary | ICD-10-CM | POA: Insufficient documentation

## 2012-04-30 DIAGNOSIS — Z95810 Presence of automatic (implantable) cardiac defibrillator: Secondary | ICD-10-CM | POA: Insufficient documentation

## 2012-04-30 DIAGNOSIS — E785 Hyperlipidemia, unspecified: Secondary | ICD-10-CM | POA: Insufficient documentation

## 2012-04-30 DIAGNOSIS — Z8546 Personal history of malignant neoplasm of prostate: Secondary | ICD-10-CM | POA: Insufficient documentation

## 2012-04-30 DIAGNOSIS — Z87891 Personal history of nicotine dependence: Secondary | ICD-10-CM | POA: Insufficient documentation

## 2012-04-30 DIAGNOSIS — Z87442 Personal history of urinary calculi: Secondary | ICD-10-CM | POA: Insufficient documentation

## 2012-04-30 DIAGNOSIS — Z8673 Personal history of transient ischemic attack (TIA), and cerebral infarction without residual deficits: Secondary | ICD-10-CM | POA: Insufficient documentation

## 2012-04-30 DIAGNOSIS — Z8739 Personal history of other diseases of the musculoskeletal system and connective tissue: Secondary | ICD-10-CM | POA: Insufficient documentation

## 2012-04-30 DIAGNOSIS — Z951 Presence of aortocoronary bypass graft: Secondary | ICD-10-CM | POA: Insufficient documentation

## 2012-04-30 DIAGNOSIS — I252 Old myocardial infarction: Secondary | ICD-10-CM | POA: Insufficient documentation

## 2012-04-30 DIAGNOSIS — M25559 Pain in unspecified hip: Secondary | ICD-10-CM | POA: Insufficient documentation

## 2012-04-30 DIAGNOSIS — J441 Chronic obstructive pulmonary disease with (acute) exacerbation: Secondary | ICD-10-CM | POA: Insufficient documentation

## 2012-04-30 DIAGNOSIS — I2510 Atherosclerotic heart disease of native coronary artery without angina pectoris: Secondary | ICD-10-CM | POA: Insufficient documentation

## 2012-04-30 DIAGNOSIS — I509 Heart failure, unspecified: Secondary | ICD-10-CM | POA: Insufficient documentation

## 2012-04-30 NOTE — ED Provider Notes (Signed)
History  This chart was scribed for non-physician practitioner working with April Smitty Cords, MD by Ardeen Jourdain, ED Scribe. This patient was seen in room TR10C/TR10C and the patient's care was started at 2327.  CSN: [id number]  Arrival date & time 04/30/12  2013   First MD Initiated Contact with Patient 04/30/12 2327      Chief Complaint  Patient presents with  . Leg Pain     Patient is a 79 y.o. male presenting with leg pain. The history is provided by the patient. No language interpreter was used.  Leg Pain Location:  Hip and leg Injury: no   Hip location:  R hip Leg location:  R upper leg and R lower leg Pain details:    Quality:  Aching and throbbing   Radiates to:  R leg   Severity:  Moderate   Onset quality:  Gradual   Timing:  Constant   Progression:  Unchanged Associated symptoms: no fever and no neck pain     [name] is a 79 y.o. male with a h/o CHF, CVA, lung cancer and prostate cancer who presents to the Emergency Department complaining of gradually worsening lower right extremity pain that began Saturday. He denies any CP, SOB, redness and swelling of the leg. Pt currently on coumadin for CVA. Symptom onset was 1 week ago and imaging was performed on 04/28/2012 with no acute bony abnormality of knee or ankle. Pt is unsure if he has a hx of blood clots. Denies cough, hemoptysis, claudication.   Past Medical History  Diagnosis Date  . Diabetes mellitus   . Hypertension   . CHF (congestive heart failure)   . S/P CABG (coronary artery bypass graft)   . Gout   . Nephrolithiasis   . Prostate cancer, recur risk not determined whether low, med or high   . Coronary artery disease   . Hypercholesterolemia   . Black lung disease   . Kidney stone 09/2000  . Angina   . MI (myocardial infarction)     "I've had 2; didn't know it"  . CVA (cerebral vascular accident)     "I've had 2; didn't know it"  . Bradycardia, sinus   . On home oxygen therapy   .  Prostate cancer 11/01/10    gleason 7, 8, 9, gold seeds 02/08/11  . Lung cancer   . COPD (chronic obstructive pulmonary disease)   . Shortness of breath   . GERD (gastroesophageal reflux disease)   . AICD (automatic cardioverter/defibrillator) present   . Arthritis     Past Surgical History  Procedure Laterality Date  . Colonoscopy    . Esophagogastroduodenoscopy  02/19/2011    Procedure: ESOPHAGOGASTRODUODENOSCOPY (EGD);  Surgeon: Petra Kuba, MD;  Location: [facility] ENDOSCOPY;  Service: Endoscopy;  Laterality: N/A;  . Upper endoscpopy    . Coronary angioplasty with stent placement  07/1997; 08/1997;03/1998  . Coronary artery bypass graft  01/2000    CABG  X2  . Coronary artery bypass graft  07/2002    CABG X3  . Incision and drainage of wound  08/2002    right thigh; S/P EVH  . Insert / replace / remove pacemaker  1979; 1992; 01/2000;  . Insert / replace / remove pacemaker  09/2003; 06/2006    w/AICD  . Insert / replace / remove pacemaker  12/2004    pacmaker explant  . Shoulder arthroscopy w/ rotator cuff repair  05/2008    left    Family History  Problem Relation Age of Onset  . Alzheimer's disease Father 24  . Cancer Father 61    metastatic prostate cancer  . Diabetes Sister 40  . Diabetes Brother   . Diabetes Brother   . Diabetes Brother   . Hypotension Neg Hx   . Malignant hyperthermia Neg Hx   . Pseudochol deficiency Neg Hx     History  Substance Use Topics  . Smoking status: Former Smoker    Types: Cigarettes    Quit date: 03/12/1949  . Smokeless tobacco: Current User    Types: Chew  . Alcohol Use: No     Comment: drank until 7 mos ago, per pt 03/19/11      Review of Systems  Constitutional: Negative for fever, diaphoresis and activity change.  HENT: Negative for congestion and neck pain.   Respiratory: Negative for cough.   Genitourinary: Negative for dysuria.  Musculoskeletal: Negative for myalgias.  Skin: Negative for color change and wound.   Neurological: Negative for headaches.  All other systems reviewed and are negative.    Allergies  Aspirin  Home Medications   Current Outpatient Rx  Name  Route  Sig  Dispense  Refill  . allopurinol (ZYLOPRIM) 300 MG tablet   Oral   Take 300 mg by mouth daily.           [name] atorvastatin (LIPITOR) 20 MG tablet   Oral   Take 20 mg by mouth daily.           [name] CALCIUM-VITAMIN D PO   Oral   Take 1 tablet by mouth daily.          . Cyanocobalamin (VITAMIN B-12 PO)   Oral   Take 1 tablet by mouth 2 (two) times daily.          . digoxin (LANOXIN) 0.125 MG tablet   Oral   Take 125 mcg by mouth daily.           . fish oil-omega-3 fatty acids 1000 MG capsule   Oral   Take 1 g by mouth 2 (two) times daily.          . furosemide (LASIX) 20 MG tablet   Oral   Take 20 mg by mouth every morning.          . isosorbide mononitrate (IMDUR) 30 MG 24 hr tablet   Oral   Take 30 mg by mouth daily.           [name] lisinopril (PRINIVIL,ZESTRIL) 10 MG tablet   Oral   Take 10 mg by mouth every morning.          . metoprolol succinate (TOPROL-XL) 25 MG 24 hr tablet   Oral   Take 25 mg by mouth every morning.         [name] NITROSTAT 0.4 MG SL tablet   Sublingual   Place 0.4 mg under the tongue every 5 (five) minutes as needed for chest pain.          [name] omeprazole (PRILOSEC) 20 MG capsule   Oral   Take 20 mg by mouth every morning.          . polyethylene glycol (MIRALAX / GLYCOLAX) packet   Oral   Take 17 g by mouth daily.   14 each   0   . potassium chloride SA (K-DUR,KLOR-CON) 20 MEQ tablet   Oral   Take 20 mEq by mouth daily.         [name] warfarin (  COUMADIN) 2.5 MG tablet   Oral   Take 1.25-2.5 mg by mouth every evening. 1 tab everyday except mon and fridays 1/2 tab           Triage Vitals: BP 110/81  Pulse 56  Temp(Src) 98.3 F (36.8 C) (Oral)  Resp 16  SpO2 100%  Physical Exam  Nursing note and vitals reviewed. Constitutional: He is  oriented to person, place, and time. He appears well-developed and well-nourished. No distress.  HENT:  Head: Normocephalic and atraumatic.  Eyes: Conjunctivae and EOM are normal. Pupils are equal, round, and reactive to light.  Neck: Normal range of motion. Neck supple. No tracheal deviation present.  Cardiovascular: Normal rate.   Intact distal pulses, capillary refill < 3 seconds  Pulmonary/Chest: Effort normal. No respiratory distress.  Abdominal: Soft. He exhibits no distension.  Musculoskeletal: Normal range of motion. He exhibits no edema.  RLE: knee ant/post ttp, no pain with internal or external rotation. Full flexion and extension. No swelling, erythema or edema noted.  Neurological: He is alert and oriented to person, place, and time.  Pt ambulates without difficulty No sensory deficit  Skin: Skin is warm and dry. He is not diaphoretic.  Skin intact, no tenting  Psychiatric: He has a normal mood and affect. His behavior is normal.    ED Course  Procedures (including critical care time)  DIAGNOSTIC STUDIES: Oxygen Saturation is 100% on room air, normal by my interpretation.    COORDINATION OF CARE:  11:52 PM: Discussed treatment plan which includes blood work with pt at bedside and pt agreed to plan. Pt does not want hip imaging as he says he hasn't had any trauma and "it aint broke"  Discussed with attending as well.   Labs Reviewed - No data to display No results found.   No diagnosis found.    MDM  Pt w hx of CA presents to ER c/o RLE pain onset x1 week. Pt currently on coumadin and therapeutic. Ddimer negative and pt is not having any PE s/s including CP, SOB, tachycardia, cough or hemoptysis. On exam RLE is without warmth swelling or erythema. Low clinical concern for DVT or PE. Xray of knee and ankle performed 3 days ago with no acute bony abnormalities seen. Pt denies recent trauma, but does report mild morning stiffness. Suspect degenerative changes of hip and  Pt to follow up with PCP tmw. Pt and he daughter appear reliable for follow up and say that they can see his PCP tmw. Pt states he a;ready has pain medications at home. No other complaints at this time.         Jaci Carrel, [place] 05/01/12 [phone]

## 2012-04-30 NOTE — ED Notes (Signed)
Patient states for the past 2 days he has been having pain to his right leg.  States he feels like there is a bump on his right foot.  Pedal pulses and dosalis pedis presents with the doppler and to touch

## 2012-04-30 NOTE — ED Notes (Signed)
C/o pain to R hip that radiates down R leg and to top of R foot.  No redness or swelling noted.  States he was here Monday and had x-rays.  Denies known injury.

## 2012-05-01 LAB — POCT I-STAT, CHEM 8
BUN: 20 mg/dL (ref 6–23)
Creatinine, Ser: 1.2 mg/dL (ref 0.50–1.35)
Hemoglobin: 11.2 g/dL — ABNORMAL LOW (ref 13.0–17.0)
Potassium: 3.6 mEq/L (ref 3.5–5.1)
Sodium: 139 mEq/L (ref 135–145)
TCO2: 28 mmol/L (ref 0–100)

## 2012-05-01 LAB — URINALYSIS, ROUTINE W REFLEX MICROSCOPIC
Glucose, UA: NEGATIVE mg/dL
Leukocytes, UA: NEGATIVE
Protein, ur: NEGATIVE mg/dL
Specific Gravity, Urine: 1.01 (ref 1.005–1.030)
Urobilinogen, UA: 0.2 mg/dL (ref 0.0–1.0)

## 2012-05-01 LAB — URINE MICROSCOPIC-ADD ON

## 2012-05-01 MED ORDER — OXYCODONE-ACETAMINOPHEN 5-325 MG PO TABS
1.0000 | ORAL_TABLET | Freq: Once | ORAL | Status: AC
  Administered 2012-05-01: 1 via ORAL
  Filled 2012-05-01: qty 1

## 2012-05-01 NOTE — ED Provider Notes (Signed)
Medical screening examination/treatment/procedure(s) were performed by non-physician practitioner and as supervising physician I was immediately available for consultation/collaboration.  Kensley Lares K Jehieli Brassell-Rasch, MD 05/01/12 0237 

## 2012-06-25 ENCOUNTER — Other Ambulatory Visit: Payer: Self-pay | Admitting: Internal Medicine

## 2012-06-25 ENCOUNTER — Ambulatory Visit (INDEPENDENT_AMBULATORY_CARE_PROVIDER_SITE_OTHER): Payer: PRIVATE HEALTH INSURANCE | Admitting: *Deleted

## 2012-06-25 DIAGNOSIS — I5022 Chronic systolic (congestive) heart failure: Secondary | ICD-10-CM

## 2012-06-25 DIAGNOSIS — I509 Heart failure, unspecified: Secondary | ICD-10-CM

## 2012-06-25 LAB — ICD DEVICE OBSERVATION
ATRIAL PACING ICD: 0 pct
BATTERY VOLTAGE: 2.5568 V
DEVICE MODEL ICD: 442538
HV IMPEDENCE: 43 Ohm
RV LEAD AMPLITUDE: 11.6 mv
RV LEAD IMPEDENCE ICD: 512.5 Ohm
TOT-0007: 2
TOT-0008: 0
TOT-0009: 1
TOT-0010: 35
TZAT-0001FASTVT: 1
TZAT-0001SLOWVT: 1
TZAT-0012SLOWVT: 200 ms
TZAT-0013FASTVT: 1
TZAT-0013SLOWVT: 3
TZAT-0019SLOWVT: 7.5 V
TZAT-0020FASTVT: 1 ms
TZAT-0020SLOWVT: 1 ms
TZON-0004SLOWVT: 12
TZON-0005FASTVT: 6
TZON-0005SLOWVT: 6
TZON-0010FASTVT: 80 ms
TZST-0001FASTVT: 2
TZST-0001FASTVT: 5
TZST-0001SLOWVT: 4
TZST-0003FASTVT: 36 J
TZST-0003FASTVT: 36 J
TZST-0003SLOWVT: 25 J

## 2012-06-25 NOTE — Progress Notes (Addendum)
ICD check 

## 2012-06-30 ENCOUNTER — Inpatient Hospital Stay (HOSPITAL_COMMUNITY)
Admission: EM | Admit: 2012-06-30 | Discharge: 2012-07-03 | DRG: 292 | Disposition: A | Discharge status: Home / Self Care | Payer: PRIVATE HEALTH INSURANCE | Attending: Internal Medicine | Admitting: Internal Medicine

## 2012-06-30 ENCOUNTER — Emergency Department (HOSPITAL_COMMUNITY): Payer: PRIVATE HEALTH INSURANCE

## 2012-06-30 ENCOUNTER — Encounter (HOSPITAL_COMMUNITY): Payer: Self-pay | Admitting: Emergency Medicine

## 2012-06-30 DIAGNOSIS — I10 Essential (primary) hypertension: Secondary | ICD-10-CM | POA: Diagnosis present

## 2012-06-30 DIAGNOSIS — Z91199 Patient's noncompliance with other medical treatment and regimen due to unspecified reason: Secondary | ICD-10-CM

## 2012-06-30 DIAGNOSIS — I482 Chronic atrial fibrillation, unspecified: Secondary | ICD-10-CM | POA: Diagnosis present

## 2012-06-30 DIAGNOSIS — E7800 Pure hypercholesterolemia, unspecified: Secondary | ICD-10-CM | POA: Diagnosis present

## 2012-06-30 DIAGNOSIS — I509 Heart failure, unspecified: Principal | ICD-10-CM | POA: Diagnosis present

## 2012-06-30 DIAGNOSIS — K219 Gastro-esophageal reflux disease without esophagitis: Secondary | ICD-10-CM | POA: Diagnosis present

## 2012-06-30 DIAGNOSIS — J4489 Other specified chronic obstructive pulmonary disease: Secondary | ICD-10-CM | POA: Diagnosis present

## 2012-06-30 DIAGNOSIS — I252 Old myocardial infarction: Secondary | ICD-10-CM

## 2012-06-30 DIAGNOSIS — Z7901 Long term (current) use of anticoagulants: Secondary | ICD-10-CM

## 2012-06-30 DIAGNOSIS — Z79899 Other long term (current) drug therapy: Secondary | ICD-10-CM

## 2012-06-30 DIAGNOSIS — Z85118 Personal history of other malignant neoplasm of bronchus and lung: Secondary | ICD-10-CM

## 2012-06-30 DIAGNOSIS — Z951 Presence of aortocoronary bypass graft: Secondary | ICD-10-CM

## 2012-06-30 DIAGNOSIS — J449 Chronic obstructive pulmonary disease, unspecified: Secondary | ICD-10-CM | POA: Diagnosis present

## 2012-06-30 DIAGNOSIS — Z8546 Personal history of malignant neoplasm of prostate: Secondary | ICD-10-CM

## 2012-06-30 DIAGNOSIS — I4891 Unspecified atrial fibrillation: Secondary | ICD-10-CM | POA: Diagnosis present

## 2012-06-30 DIAGNOSIS — E119 Type 2 diabetes mellitus without complications: Secondary | ICD-10-CM | POA: Diagnosis present

## 2012-06-30 DIAGNOSIS — Z9119 Patient's noncompliance with other medical treatment and regimen: Secondary | ICD-10-CM

## 2012-06-30 DIAGNOSIS — Z886 Allergy status to analgesic agent status: Secondary | ICD-10-CM

## 2012-06-30 DIAGNOSIS — E871 Hypo-osmolality and hyponatremia: Secondary | ICD-10-CM | POA: Diagnosis not present

## 2012-06-30 DIAGNOSIS — Z87891 Personal history of nicotine dependence: Secondary | ICD-10-CM

## 2012-06-30 DIAGNOSIS — I2510 Atherosclerotic heart disease of native coronary artery without angina pectoris: Secondary | ICD-10-CM | POA: Diagnosis present

## 2012-06-30 DIAGNOSIS — Z95810 Presence of automatic (implantable) cardiac defibrillator: Secondary | ICD-10-CM

## 2012-06-30 DIAGNOSIS — Z833 Family history of diabetes mellitus: Secondary | ICD-10-CM

## 2012-06-30 DIAGNOSIS — Z8042 Family history of malignant neoplasm of prostate: Secondary | ICD-10-CM

## 2012-06-30 DIAGNOSIS — Z8673 Personal history of transient ischemic attack (TIA), and cerebral infarction without residual deficits: Secondary | ICD-10-CM

## 2012-06-30 DIAGNOSIS — M109 Gout, unspecified: Secondary | ICD-10-CM | POA: Diagnosis present

## 2012-06-30 DIAGNOSIS — Z9861 Coronary angioplasty status: Secondary | ICD-10-CM

## 2012-06-30 DIAGNOSIS — J309 Allergic rhinitis, unspecified: Secondary | ICD-10-CM | POA: Diagnosis present

## 2012-06-30 LAB — BASIC METABOLIC PANEL
CO2: 27 mEq/L (ref 19–32)
Chloride: 95 mEq/L — ABNORMAL LOW (ref 96–112)
GFR calc non Af Amer: 61 mL/min — ABNORMAL LOW (ref >90)
Glucose, Bld: 156 mg/dL — ABNORMAL HIGH (ref 70–99)
Potassium: 4.1 mEq/L (ref 3.5–5.1)
Sodium: 134 mEq/L — ABNORMAL LOW (ref 135–145)

## 2012-06-30 LAB — CBC
Hemoglobin: 11.2 g/dL — ABNORMAL LOW (ref 13.0–17.0)
Hemoglobin: 11 g/dL — ABNORMAL LOW (ref 13.0–17.0)
MCH: 29.7 pg (ref 26.0–34.0)
RBC: 3.79 MIL/uL — ABNORMAL LOW (ref 4.22–5.81)
RBC: 3.7 MIL/uL — ABNORMAL LOW (ref 4.22–5.81)
WBC: 5.5 10*3/uL (ref 4.0–10.5)
WBC: 4.5 10*3/uL (ref 4.0–10.5)

## 2012-06-30 LAB — PROTIME-INR
INR: 1.73 — ABNORMAL HIGH (ref 0.00–1.49)
Prothrombin Time: 19.7 seconds — ABNORMAL HIGH (ref 11.6–15.2)

## 2012-06-30 LAB — CK TOTAL AND CKMB (NOT AT ARMC)
CK, MB: 2.1 ng/mL (ref 0.3–4.0)
Relative Index: INVALID (ref 0.0–2.5)

## 2012-06-30 LAB — TROPONIN I
Troponin I: <0.3 ng/mL (ref <0.30)
Troponin I: <0.3 ng/mL (ref <0.30)

## 2012-06-30 LAB — CREATININE, SERUM
Creatinine, Ser: 1.03 mg/dL (ref 0.50–1.35)
GFR calc Af Amer: 79 mL/min — ABNORMAL LOW (ref >90)

## 2012-06-30 LAB — MRSA PCR SCREENING: MRSA by PCR: NEGATIVE

## 2012-06-30 LAB — APTT: aPTT: 34 seconds (ref 24–37)

## 2012-06-30 MED ORDER — LISINOPRIL 10 MG PO TABS
10.0000 mg | ORAL_TABLET | Freq: Every day | ORAL | Status: DC
  Administered 2012-07-01 – 2012-07-03 (×3): 10 mg via ORAL
  Filled 2012-06-30 (×3): qty 1

## 2012-06-30 MED ORDER — ISOSORBIDE MONONITRATE ER 30 MG PO TB24
30.0000 mg | ORAL_TABLET | Freq: Every day | ORAL | Status: DC
  Administered 2012-07-01 – 2012-07-03 (×3): 30 mg via ORAL
  Filled 2012-06-30 (×3): qty 1

## 2012-06-30 MED ORDER — ATORVASTATIN CALCIUM 20 MG PO TABS
20.0000 mg | ORAL_TABLET | Freq: Every day | ORAL | Status: DC
  Administered 2012-07-01 – 2012-07-03 (×3): 20 mg via ORAL
  Filled 2012-06-30 (×3): qty 1

## 2012-06-30 MED ORDER — SODIUM CHLORIDE 0.9 % IJ SOLN
3.0000 mL | Freq: Two times a day (BID) | INTRAMUSCULAR | Status: DC
  Administered 2012-06-30 – 2012-07-03 (×6): 3 mL via INTRAVENOUS

## 2012-06-30 MED ORDER — FUROSEMIDE 10 MG/ML IJ SOLN
20.0000 mg | Freq: Once | INTRAMUSCULAR | Status: AC
  Administered 2012-06-30: 20 mg via INTRAVENOUS
  Filled 2012-06-30: qty 2

## 2012-06-30 MED ORDER — SODIUM CHLORIDE 0.9 % IJ SOLN: 3.0000 mL | INTRAMUSCULAR | Status: DC | PRN

## 2012-06-30 MED ORDER — NITROGLYCERIN 0.4 MG SL SUBL: 0.4000 mg | SUBLINGUAL_TABLET | SUBLINGUAL | Status: DC | PRN

## 2012-06-30 MED ORDER — DIGOXIN 125 MCG PO TABS
125.0000 ug | ORAL_TABLET | Freq: Every day | ORAL | Status: DC
  Administered 2012-07-01 – 2012-07-03 (×3): 125 ug via ORAL
  Filled 2012-06-30 (×3): qty 1

## 2012-06-30 MED ORDER — POTASSIUM CHLORIDE CRYS ER 20 MEQ PO TBCR
20.0000 meq | EXTENDED_RELEASE_TABLET | Freq: Every day | ORAL | Status: DC
  Administered 2012-06-30 – 2012-07-03 (×4): 20 meq via ORAL
  Filled 2012-06-30 (×4): qty 1

## 2012-06-30 MED ORDER — PANTOPRAZOLE SODIUM 40 MG PO TBEC
40.0000 mg | DELAYED_RELEASE_TABLET | Freq: Every day | ORAL | Status: DC
  Administered 2012-06-30 – 2012-07-03 (×4): 40 mg via ORAL
  Filled 2012-06-30 (×4): qty 1

## 2012-06-30 MED ORDER — METOPROLOL SUCCINATE ER 25 MG PO TB24
25.0000 mg | ORAL_TABLET | Freq: Every morning | ORAL | Status: DC
  Administered 2012-07-01 – 2012-07-03 (×3): 25 mg via ORAL
  Filled 2012-06-30 (×3): qty 1

## 2012-06-30 MED ORDER — ACETAMINOPHEN 325 MG PO TABS: 650.0000 mg | ORAL_TABLET | Freq: Four times a day (QID) | ORAL | Status: DC | PRN

## 2012-06-30 MED ORDER — ACETAMINOPHEN 650 MG RE SUPP: 650.0000 mg | Freq: Four times a day (QID) | RECTAL | Status: DC | PRN

## 2012-06-30 MED ORDER — FUROSEMIDE 10 MG/ML IJ SOLN
20.0000 mg | Freq: Two times a day (BID) | INTRAMUSCULAR | Status: DC
  Administered 2012-06-30 – 2012-07-02 (×5): 20 mg via INTRAVENOUS
  Filled 2012-06-30 (×8): qty 2

## 2012-06-30 MED ORDER — SODIUM CHLORIDE 0.9 % IJ SOLN
3.0000 mL | Freq: Two times a day (BID) | INTRAMUSCULAR | Status: DC
  Administered 2012-07-01 – 2012-07-02 (×2): 3 mL via INTRAVENOUS

## 2012-06-30 MED ORDER — DEXTROSE 5 % IV SOLN
500.0000 mg | INTRAVENOUS | Status: DC
  Administered 2012-06-30: 500 mg via INTRAVENOUS
  Filled 2012-06-30 (×2): qty 500

## 2012-06-30 MED ORDER — IOHEXOL 350 MG/ML SOLN
100.0000 mL | Freq: Once | INTRAVENOUS | Status: AC | PRN
  Administered 2012-06-30: 100 mL via INTRAVENOUS

## 2012-06-30 MED ORDER — ENOXAPARIN SODIUM 40 MG/0.4ML ~~LOC~~ SOLN
40.0000 mg | SUBCUTANEOUS | Status: DC
  Administered 2012-06-30: 40 mg via SUBCUTANEOUS
  Filled 2012-06-30 (×2): qty 0.4

## 2012-06-30 MED ORDER — OMEGA-3-ACID ETHYL ESTERS 1 G PO CAPS
1.0000 g | ORAL_CAPSULE | Freq: Two times a day (BID) | ORAL | Status: DC
  Administered 2012-06-30 – 2012-07-03 (×6): 1 g via ORAL
  Filled 2012-06-30 (×7): qty 1

## 2012-06-30 MED ORDER — VITAMIN B-12 1000 MCG PO TABS
1000.0000 ug | ORAL_TABLET | Freq: Every day | ORAL | Status: DC
  Administered 2012-07-01 – 2012-07-03 (×3): 1000 ug via ORAL
  Filled 2012-06-30 (×3): qty 1

## 2012-06-30 MED ORDER — ALLOPURINOL 300 MG PO TABS
300.0000 mg | ORAL_TABLET | Freq: Every day | ORAL | Status: DC
  Administered 2012-07-01 – 2012-07-03 (×3): 300 mg via ORAL
  Filled 2012-06-30 (×3): qty 1

## 2012-06-30 MED ORDER — SODIUM CHLORIDE 0.9 % IV SOLN: 250.0000 mL | INTRAVENOUS | Status: DC | PRN

## 2012-06-30 MED ORDER — CALCIUM CARBONATE-VITAMIN D 500-200 MG-UNIT PO TABS
1.0000 | ORAL_TABLET | Freq: Two times a day (BID) | ORAL | Status: DC
  Administered 2012-06-30 – 2012-07-03 (×6): 1 via ORAL
  Filled 2012-06-30 (×8): qty 1

## 2012-06-30 MED ORDER — OMEGA-3 FATTY ACIDS 1000 MG PO CAPS: 1.0000 g | ORAL_CAPSULE | Freq: Two times a day (BID) | ORAL | Status: DC

## 2012-06-30 NOTE — H&P (Signed)
[facility] History and Physical  [name] UJW:[id number] DOB: [birth year] DOA: 06/30/2012  Referring physician: Effie [name] PCP: [name] Grippe, MD  Specialists:   Chief Complaint: sob  HPI: [name] is a 79 y.o. male  With hx of CHF, apparently became sob this am. + orthopnea, denies fever, chills, cp, palp, n/v, pnd, wt gain, lower ext edema. Pt denies eating out, or any salty food,  Medication noncompliance.  Pt was concerned about this sob and therefore presented to ED for evaluation.  CTA chest was negative for PE but + for CHF.  Pt will be admitted for inpatient stay likely lasting 2 or more nights for CHF.     Review of Systems: The patient denies anorexia, fever, weight loss,, vision loss, decreased hearing, hoarseness, chest pain, syncope, , peripheral edema, balance deficits, hemoptysis, abdominal pain, melena, hematochezia, severe indigestion/heartburn, hematuria, incontinence, genital sores, muscle weakness, suspicious skin lesions, transient blindness, difficulty walking, depression, unusual weight change, abnormal bleeding, enlarged lymph nodes, angioedema, and breast masses.    Past Medical History  Diagnosis Date  . Diabetes mellitus   . Hypertension   . CHF (congestive heart failure)   . S/P CABG (coronary artery bypass graft)   . Gout   . Nephrolithiasis   . Prostate cancer, recur risk not determined whether low, med or high   . Coronary artery disease   . Hypercholesterolemia   . Black lung disease   . Kidney stone 09/2000  . Angina   . MI (myocardial infarction)     "I've had 2; didn't know it"  . CVA (cerebral vascular accident)     "I've had 2; didn't know it"  . Bradycardia, sinus   . On home oxygen therapy   . Prostate cancer 11/01/10    gleason 7, 8, 9, gold seeds 02/08/11  . Lung cancer   . COPD (chronic obstructive pulmonary disease)   . Shortness of breath   . GERD (gastroesophageal reflux disease)   . AICD (automatic  cardioverter/defibrillator) present   . Arthritis    Past Surgical History  Procedure Laterality Date  . Colonoscopy    . Esophagogastroduodenoscopy  02/19/2011    Procedure: ESOPHAGOGASTRODUODENOSCOPY (EGD);  Surgeon: Petra Kuba, MD;  Location: [facility] ENDOSCOPY;  Service: Endoscopy;  Laterality: N/A;  . Upper endoscpopy    . Coronary angioplasty with stent placement  07/1997; 08/1997;03/1998  . Coronary artery bypass graft  01/2000    CABG  X2  . Coronary artery bypass graft  07/2002    CABG X3  . Incision and drainage of wound  08/2002    right thigh; S/P EVH  . Insert / replace / remove pacemaker  1979; 1992; 01/2000;  . Insert / replace / remove pacemaker  09/2003; 06/2006    w/AICD  . Insert / replace / remove pacemaker  12/2004    pacmaker explant  . Shoulder arthroscopy w/ rotator cuff repair  05/2008    left   Social History:  reports that he quit smoking about 63 years ago. His smoking use included Cigarettes. He smoked 0.00 packs per day. His smokeless tobacco use includes Chew. He reports that he does not drink alcohol or use illicit drugs. where does patient live--home Pt is able to perform ADLS  Allergies  Allergen Reactions  . Aspirin Hives    Family History  Problem Relation Age of Onset  . Alzheimer's disease Father 30  . Cancer Father 71    metastatic prostate cancer  .  Diabetes Sister 5  . Diabetes Brother   . Diabetes Brother   . Diabetes Brother   . Hypotension Neg Hx   . Malignant hyperthermia Neg Hx   . Pseudochol deficiency Neg Hx     (be sure to complete)  Prior to Admission medications   Medication Sig Start Date End Date Taking? Authorizing Provider  allopurinol (ZYLOPRIM) 300 MG tablet Take 300 mg by mouth daily.     Yes Historical Provider, MD  atorvastatin (LIPITOR) 20 MG tablet Take 20 mg by mouth daily.     Yes Historical Provider, MD  CALCIUM-VITAMIN D PO Take 1 tablet by mouth daily.    Yes Historical Provider, MD  Cyanocobalamin (VITAMIN  B-12 PO) Take 1 tablet by mouth 2 (two) times daily.    Yes Historical Provider, MD  digoxin (LANOXIN) 0.125 MG tablet Take 125 mcg by mouth daily.     Yes Historical Provider, MD  fish oil-omega-3 fatty acids 1000 MG capsule Take 1 g by mouth 2 (two) times daily.    Yes Historical Provider, MD  furosemide (LASIX) 20 MG tablet Take 20 mg by mouth every morning.  02/11/11  Yes Standley Brooking, MD  isosorbide mononitrate (IMDUR) 30 MG 24 hr tablet Take 30 mg by mouth daily.     Yes Historical Provider, MD  lisinopril (PRINIVIL,ZESTRIL) 10 MG tablet Take 10 mg by mouth every morning.    Yes Historical Provider, MD  metoprolol succinate (TOPROL-XL) 25 MG 24 hr tablet Take 25 mg by mouth every morning.   Yes Historical Provider, MD  omeprazole (PRILOSEC) 20 MG capsule Take 20 mg by mouth every morning.    Yes Historical Provider, MD  potassium chloride SA (K-DUR,KLOR-CON) 20 MEQ tablet Take 20 mEq by mouth daily.   Yes Historical Provider, MD  warfarin (COUMADIN) 2.5 MG tablet Take 1.25-2.5 mg by mouth every evening. 1 (2.5 mg) tab everyday except mondays and fridays pt takes 1/2 tab (1.25)   Yes Historical Provider, MD  NITROSTAT 0.4 MG SL tablet Place 0.4 mg under the tongue every 5 (five) minutes as needed for chest pain.  10/17/11   Historical Provider, MD   Physical Exam: Filed Vitals:   06/30/12 1500 06/30/12 1556 06/30/12 1600 06/30/12 1654  BP: 121/57 167/59 138/92 123/67  Pulse: 60 66 67 63  Temp:    97.8 F (36.6 C)  TempSrc:    Oral  Resp: 21 22 20 20   Height:    5\' 6"  (1.676 m)  Weight:    142 lb 10.2 oz (64.7 kg)  SpO2: 99% 100% 99% 100%     General:  Axox3, no acute distress  Eyes: anicteric  ENT: mm slightly dry  Neck: slight jvd, no bruit, no tm,   Cardiovascular: rrr s1, s2, 1/6 sem apex  Respiratory:  ctab  Abdomen: soft, obese, nt, nd, +bs  Skin: no rash   Musculoskeletal: negative  Psychiatric: no depression  Neurologic: cn2-12 intact,reflexes 2+  symmetric, diffuse with downgoing toes bilaterally.; motor 5/5 in all 4 ext, pniprik intact  Labs on Admission:  Basic Metabolic Panel:  Recent Labs Lab 06/30/12 1005  NA 134*  K 4.1  CL 95*  CO2 27  GLUCOSE 156*  BUN 26*  CREATININE 1.13  CALCIUM 9.5   Liver Function Tests: No results found for this basename: AST, ALT, ALKPHOS, BILITOT, PROT, ALBUMIN,  in the last 168 hours No results found for this basename: LIPASE, AMYLASE,  in the last 168 hours No  results found for this basename: AMMONIA,  in the last 168 hours CBC:  Recent Labs Lab 06/30/12 1005 06/30/12 1719  WBC 5.5 4.5  HGB 11.2* 11.0*  HCT 32.7* 31.8*  MCV 86.3 85.9  PLT 166 151   Cardiac Enzymes: No results found for this basename: CKTOTAL, CKMB, CKMBINDEX, TROPONINI,  in the last 168 hours  BNP (last 3 results)  Recent Labs  06/30/12 1005  PROBNP 2123.0*   CBG: No results found for this basename: GLUCAP,  in the last 168 hours  Radiological Exams on Admission: Ct Angio Chest Pe W/cm &/or Wo Cm  06/30/2012  **ADDENDUM** CREATED: 06/30/2012 13:11:59  Due to a PACS technical problem at the time of dictation, prior examinations were not available.  Today's examination is now compared to CT chest 03/24/2011.  There is subpleural scarring in the left lower lobe, adjacent to pleural calcification, unchanged from 03/24/2011.  Scarring is also seen along the fissures on the right.  Left ventricular calcification is unchanged.  **END ADDENDUM** SIGNED BY: Reyes Ivan, M.D.   06/30/2012  *RADIOLOGY REPORT*  Clinical Data: Shortness of breath and chest pain.  CT ANGIOGRAPHY CHEST  Technique:  Multidetector CT imaging of the chest using the standard protocol during bolus administration of intravenous contrast. Multiplanar reconstructed images including MIPs were obtained and reviewed to evaluate the vascular anatomy.  Contrast: OMNIPAQUE IOHEXOL 350 MG/ML SOLN  Comparison: None.  Findings: Image quality is  somewhat degraded by respiratory motion. No definite pulmonary embolus.  Mediastinal lymph nodes are not enlarged by CT size criteria.  No hilar or axillary adenopathy. Heart is enlarged.  No pericardial effusion.  Upper esophagus is mildly dilated, nonspecific.  Again, respiratory motion degrades image quality.  Probable scarring along the minor and right major fissure.  Mild scattered air space disease at the lung bases, greatest in the left lower lobe.  Subpleural density in the left lower lobe is seen in association with pleural calcification and may be chronic (example image 70).  Incidental imaging of the upper abdomen shows no acute findings. No worrisome lytic or sclerotic lesions.  Degenerative changes are seen in the spine.  Old posterior left rib fractures.  IMPRESSION:  1.  Image quality is somewhat degraded by respiratory motion.  No definite pulmonary embolus. 2.  Mild scattered air space disease at the lung bases may be due to expiratory phase imaging.  Difficult to exclude an infectious or inflammatory process. 3.  Subpleural consolidation in the left lower lobe is of unknown chronicity and may be chronic given adjacent pleural calcification. Cannot exclude an acute infectious/inflammatory etiology.   Original Report Authenticated By: Leanna Battles, M.D.    Dg Chest Port 1 View  06/30/2012  *RADIOLOGY REPORT*  Clinical Data: Chest pain and shortness of breath.  PORTABLE CHEST - 1 VIEW  Comparison: 04/22/2012  Findings: There is evidence of diffuse interstitial edema which is slightly more prominent in the left lung than the right.  A small left pleural effusion is present.  Heart size is stable and there is a stable appearance to an ICD/pacing device as well as old pacer wires.  IMPRESSION: Diffuse interstitial edema consistent with congestive heart failure.  A small left pleural effusion is present.   Original Report Authenticated By: Irish Lack, M.D.     EKG: Independently reviewed.    Assessment/Plan Principal Problem:   CHF (congestive heart failure) tele, cycle cardiac markers, check echo,  Digoxin, Lasix 20mg  iv bid, cont lisinopril.  Bronchitis: zithromax  CAD s/p CABG: cont asa, imdur, statin, metoprolol  Pafib:  Cont coumadin, cont digoxin.   Gout: cont allopurinol   Code Status: Full code (must indicate code status--if unknown or must be presumed, indicate so)  Time spent: 60 minutes  [name] [facility] Pager [phone]  If 7PM-7AM, please contact night-coverage [phone]  06/30/2012, 5:54 PM

## 2012-06-30 NOTE — ED Notes (Signed)
Pt c/o SOB with hand and leg cramping; pt sts some CP; pt denies swelling; pt wears home O2

## 2012-06-30 NOTE — Progress Notes (Signed)
Pt admitted from Ed per stretcher. Oriented to room and safety plan. Alert/oriented. Tele monitor placed and VS done.

## 2012-06-30 NOTE — ED Provider Notes (Signed)
History     CSN: [id number]  Arrival date & time 06/30/12  0944   First MD Initiated Contact with Patient 06/30/12 1001      Chief Complaint  Patient presents with  . Shortness of Breath  . Chest Pain    (Consider location/radiation/quality/duration/timing/severity/associated sxs/prior treatment) HPI Comments: [name] is a 79 y.o. Male who states that he has shortness of breath that is unusual for him, since yesterday. He has been using his nebulizer, without relief. He also has a cramping sensation in his hands, legs, and feet. He's been using his regular medications. His weight was is normal. 148 pounds, this morning, at home. He denies gaining weight recently. He has mild, diffuse chest discomfort. He has not had any vomiting, diarrhea, or back pain. There are no known modifying factors  Patient is a 79 y.o. male presenting with shortness of breath and chest pain. The history is provided by the patient.  Shortness of Breath Associated symptoms: chest pain   Chest Pain Associated symptoms: shortness of breath     Past Medical History  Diagnosis Date  . Diabetes mellitus   . Hypertension   . CHF (congestive heart failure)   . S/P CABG (coronary artery bypass graft)   . Gout   . Nephrolithiasis   . Prostate cancer, recur risk not determined whether low, med or high   . Coronary artery disease   . Hypercholesterolemia   . Black lung disease   . Kidney stone 09/2000  . Angina   . MI (myocardial infarction)     "I've had 2; didn't know it"  . CVA (cerebral vascular accident)     "I've had 2; didn't know it"  . Bradycardia, sinus   . On home oxygen therapy   . Prostate cancer 11/01/10    gleason 7, 8, 9, gold seeds 02/08/11  . Lung cancer   . COPD (chronic obstructive pulmonary disease)   . Shortness of breath   . GERD (gastroesophageal reflux disease)   . AICD (automatic cardioverter/defibrillator) present   . Arthritis     Past Surgical History  Procedure  Laterality Date  . Colonoscopy    . Esophagogastroduodenoscopy  02/19/2011    Procedure: ESOPHAGOGASTRODUODENOSCOPY (EGD);  Surgeon: Petra Kuba, MD;  Location: [facility] ENDOSCOPY;  Service: Endoscopy;  Laterality: N/A;  . Upper endoscpopy    . Coronary angioplasty with stent placement  07/1997; 08/1997;03/1998  . Coronary artery bypass graft  01/2000    CABG  X2  . Coronary artery bypass graft  07/2002    CABG X3  . Incision and drainage of wound  08/2002    right thigh; S/P EVH  . Insert / replace / remove pacemaker  1979; 1992; 01/2000;  . Insert / replace / remove pacemaker  09/2003; 06/2006    w/AICD  . Insert / replace / remove pacemaker  12/2004    pacmaker explant  . Shoulder arthroscopy w/ rotator cuff repair  05/2008    left    Family History  Problem Relation Age of Onset  . Alzheimer's disease Father 58  . Cancer Father 42    metastatic prostate cancer  . Diabetes Sister 23  . Diabetes Brother   . Diabetes Brother   . Diabetes Brother   . Hypotension Neg Hx   . Malignant hyperthermia Neg Hx   . Pseudochol deficiency Neg Hx     History  Substance Use Topics  . Smoking status: Former Smoker  Types: Cigarettes    Quit date: 03/12/1949  . Smokeless tobacco: Current User    Types: Chew  . Alcohol Use: No     Comment: drank until 7 mos ago, per pt 03/19/11      Review of Systems  Respiratory: Positive for shortness of breath.   Cardiovascular: Positive for chest pain.  All other systems reviewed and are negative.    Allergies  Aspirin  Home Medications   Current Outpatient Rx  Name  Route  Sig  Dispense  Refill  . allopurinol (ZYLOPRIM) 300 MG tablet   Oral   Take 300 mg by mouth daily.           [name] atorvastatin (LIPITOR) 20 MG tablet   Oral   Take 20 mg by mouth daily.           [name] CALCIUM-VITAMIN D PO   Oral   Take 1 tablet by mouth daily.          . Cyanocobalamin (VITAMIN B-12 PO)   Oral   Take 1 tablet by mouth 2 (two) times daily.           . digoxin (LANOXIN) 0.125 MG tablet   Oral   Take 125 mcg by mouth daily.           . fish oil-omega-3 fatty acids 1000 MG capsule   Oral   Take 1 g by mouth 2 (two) times daily.          . furosemide (LASIX) 20 MG tablet   Oral   Take 20 mg by mouth every morning.          . isosorbide mononitrate (IMDUR) 30 MG 24 hr tablet   Oral   Take 30 mg by mouth daily.           [name] lisinopril (PRINIVIL,ZESTRIL) 10 MG tablet   Oral   Take 10 mg by mouth every morning.          . metoprolol succinate (TOPROL-XL) 25 MG 24 hr tablet   Oral   Take 25 mg by mouth every morning.         [name] omeprazole (PRILOSEC) 20 MG capsule   Oral   Take 20 mg by mouth every morning.          . potassium chloride SA (K-DUR,KLOR-CON) 20 MEQ tablet   Oral   Take 20 mEq by mouth daily.         [name] warfarin (COUMADIN) 2.5 MG tablet   Oral   Take 1.25-2.5 mg by mouth every evening. 1 (2.5 mg) tab everyday except mondays and fridays pt takes 1/2 tab (1.25)         . NITROSTAT 0.4 MG SL tablet   Sublingual   Place 0.4 mg under the tongue every 5 (five) minutes as needed for chest pain.            BP 121/57  Pulse 60  Temp(Src) 97.7 F (36.5 C) (Oral)  Resp 21  SpO2 99%  Physical Exam  Nursing note and vitals reviewed. Constitutional: He is oriented to person, place, and time. He appears well-developed.  Elderly, frail  HENT:  Head: Normocephalic and atraumatic.  Right Ear: External ear normal.  Left Ear: External ear normal.  Eyes: Conjunctivae and EOM are normal. Pupils are equal, round, and reactive to light.  Neck: Normal range of motion and phonation normal. Neck supple.  Cardiovascular: Normal rate, regular rhythm, normal heart sounds and intact distal  pulses.   Pulmonary/Chest: Effort normal and breath sounds normal. No respiratory distress. He has no wheezes. He has no rales. He exhibits no bony tenderness.  Abdominal: Soft. Normal appearance. There is no tenderness.   Musculoskeletal: Normal range of motion. He exhibits no edema and no tenderness.  Neurological: He is alert and oriented to person, place, and time. He has normal strength. No cranial nerve deficit or sensory deficit. He exhibits normal muscle tone. Coordination normal.  Skin: Skin is warm, dry and intact.  Psychiatric: He has a normal mood and affect. His behavior is normal. Judgment and thought content normal.    ED Course  Procedures (including critical care time)  Medications  furosemide (LASIX) injection 20 mg (not administered)  iohexol (OMNIPAQUE) 350 MG/ML injection 100 mL (100 mLs Intravenous Contrast Given 06/30/12 1225)    Consultation: 15:30- case discussed with Dr. Selena Batten, he will admit the patient, and asked that I write holding orders to send the patient to a telemetry bed.     Date: 12/28/2011  Rate: 63  Rhythm: atrial fibrillation  QRS Axis: left axis deviation  PR and QT Intervals: Normal QT interval  ST/T Wave abnormalities: early repolarization  PR and QRS Conduction Disutrbances:nonspecific intraventricular conduction delay  Narrative Interpretation:   Old EKG Reviewed: unchanged    Labs Reviewed  CBC - Abnormal; Notable for the following:    RBC 3.79 (*)    Hemoglobin 11.2 (*)    HCT 32.7 (*)    All other components within normal limits  BASIC METABOLIC PANEL - Abnormal; Notable for the following:    Sodium 134 (*)    Chloride 95 (*)    Glucose, Bld 156 (*)    BUN 26 (*)    GFR calc non Af Amer 61 (*)    GFR calc Af Amer 70 (*)    All other components within normal limits  PRO B NATRIURETIC PEPTIDE - Abnormal; Notable for the following:    Pro B Natriuretic peptide (BNP) 2123.0 (*)    All other components within normal limits  PROTIME-INR - Abnormal; Notable for the following:    Prothrombin Time 19.7 (*)    INR 1.73 (*)    All other components within normal limits  POCT I-STAT TROPONIN I   Ct Angio Chest Pe W/cm &/or Wo Cm  06/30/2012   **ADDENDUM** CREATED: 06/30/2012 13:11:59  Due to a PACS technical problem at the time of dictation, prior examinations were not available.  Today's examination is now compared to CT chest 03/24/2011.  There is subpleural scarring in the left lower lobe, adjacent to pleural calcification, unchanged from 03/24/2011.  Scarring is also seen along the fissures on the right.  Left ventricular calcification is unchanged.  **END ADDENDUM** SIGNED BY: Reyes Ivan, M.D.   06/30/2012  *RADIOLOGY REPORT*  Clinical Data: Shortness of breath and chest pain.  CT ANGIOGRAPHY CHEST  Technique:  Multidetector CT imaging of the chest using the standard protocol during bolus administration of intravenous contrast. Multiplanar reconstructed images including MIPs were obtained and reviewed to evaluate the vascular anatomy.  Contrast: OMNIPAQUE IOHEXOL 350 MG/ML SOLN  Comparison: None.  Findings: Image quality is somewhat degraded by respiratory motion. No definite pulmonary embolus.  Mediastinal lymph nodes are not enlarged by CT size criteria.  No hilar or axillary adenopathy. Heart is enlarged.  No pericardial effusion.  Upper esophagus is mildly dilated, nonspecific.  Again, respiratory motion degrades image quality.  Probable scarring along the minor and  right major fissure.  Mild scattered air space disease at the lung bases, greatest in the left lower lobe.  Subpleural density in the left lower lobe is seen in association with pleural calcification and may be chronic (example image 70).  Incidental imaging of the upper abdomen shows no acute findings. No worrisome lytic or sclerotic lesions.  Degenerative changes are seen in the spine.  Old posterior left rib fractures.  IMPRESSION:  1.  Image quality is somewhat degraded by respiratory motion.  No definite pulmonary embolus. 2.  Mild scattered air space disease at the lung bases may be due to expiratory phase imaging.  Difficult to exclude an infectious or  inflammatory process. 3.  Subpleural consolidation in the left lower lobe is of unknown chronicity and may be chronic given adjacent pleural calcification. Cannot exclude an acute infectious/inflammatory etiology.   Original Report Authenticated By: Leanna Battles, M.D.    Dg Chest Port 1 View  06/30/2012  *RADIOLOGY REPORT*  Clinical Data: Chest pain and shortness of breath.  PORTABLE CHEST - 1 VIEW  Comparison: 04/22/2012  Findings: There is evidence of diffuse interstitial edema which is slightly more prominent in the left lung than the right.  A small left pleural effusion is present.  Heart size is stable and there is a stable appearance to an ICD/pacing device as well as old pacer wires.  IMPRESSION: Diffuse interstitial edema consistent with congestive heart failure.  A small left pleural effusion is present.   Original Report Authenticated By: Irish Lack, M.D.      1. CHF (congestive heart failure)       MDM  Shortness of breath with congestive heart failure, and history ejection fraction 25-30% 5/13. BNP is elevated. Doubt any ACS, or PE. Possible inflammatory lung process. Patient will require admission for observation and treatment.     Plan: Admit   Flint Melter, MD 06/30/12 1537

## 2012-07-01 ENCOUNTER — Encounter (HOSPITAL_COMMUNITY): Payer: Self-pay | Admitting: General Practice

## 2012-07-01 LAB — COMPREHENSIVE METABOLIC PANEL
AST: 21 U/L (ref 0–37)
Albumin: 3 g/dL — ABNORMAL LOW (ref 3.5–5.2)
BUN: 25 mg/dL — ABNORMAL HIGH (ref 6–23)
Calcium: 9.6 mg/dL (ref 8.4–10.5)
Creatinine, Ser: 1.16 mg/dL (ref 0.50–1.35)
Total Protein: 7.1 g/dL (ref 6.0–8.3)

## 2012-07-01 LAB — PROTIME-INR
INR: 1.81 — ABNORMAL HIGH (ref 0.00–1.49)
Prothrombin Time: 20.3 seconds — ABNORMAL HIGH (ref 11.6–15.2)

## 2012-07-01 LAB — TROPONIN I: Troponin I: <0.3 ng/mL (ref <0.30)

## 2012-07-01 LAB — PRO B NATRIURETIC PEPTIDE: Pro B Natriuretic peptide (BNP): 2111 pg/mL — ABNORMAL HIGH (ref 0–450)

## 2012-07-01 MED ORDER — WARFARIN - PHARMACIST DOSING INPATIENT
Freq: Every day | Status: DC
  Administered 2012-07-01 – 2012-07-03 (×3)

## 2012-07-01 MED ORDER — AZITHROMYCIN 250 MG PO TABS
250.0000 mg | ORAL_TABLET | Freq: Every day | ORAL | Status: DC
  Administered 2012-07-01 – 2012-07-03 (×3): 250 mg via ORAL
  Filled 2012-07-01 (×3): qty 1

## 2012-07-01 MED ORDER — ALBUTEROL SULFATE (5 MG/ML) 0.5% IN NEBU
INHALATION_SOLUTION | RESPIRATORY_TRACT | Status: AC
  Administered 2012-07-01: 2.5 mg via RESPIRATORY_TRACT
  Filled 2012-07-01: qty 0.5

## 2012-07-01 MED ORDER — ALBUTEROL SULFATE (5 MG/ML) 0.5% IN NEBU
2.5000 mg | INHALATION_SOLUTION | Freq: Four times a day (QID) | RESPIRATORY_TRACT | Status: DC | PRN
  Administered 2012-07-03: 2.5 mg via RESPIRATORY_TRACT
  Filled 2012-07-01: qty 0.5

## 2012-07-01 MED ORDER — WARFARIN SODIUM 5 MG PO TABS
5.0000 mg | ORAL_TABLET | Freq: Once | ORAL | Status: AC
  Administered 2012-07-01: 5 mg via ORAL
  Filled 2012-07-01: qty 1

## 2012-07-01 NOTE — Clinical Documentation Improvement (Signed)
GENERIC DOCUMENTATION CLARIFICATION QUERY  CLINICAL DOCUMENTATION QUERIES ARE NOT PART OF THE PERMANENT MEDICAL RECORD   Please update your documentation within the medical record to reflect your response to this query.                                                                                         07/01/12   Dr. Selena Batten and/or Associates,  In a better effort to capture your patient's severity of illness, reflect appropriate length of stay and utilization of resources, a review of the patient medical record has revealed the following indicators:   - Diagnosis of "Bronchitis" this admission being treated with Zithromax that was changed to po 07/01/12.   - Known history of COPD, Black Lung, Lung Cancer, and the use of Home 02 per H&P   - Current Hospital Medications also include Proventil    Based on your clinical judgment, please document in the progress notes and discharge summary if a condition or conditions below provides greater specificity regarding the patient's respiratory status:   - COPD with Acute Bronchitis   - Chronic Respiratory Failure requiring Home 02   - Other Condition   - Unable to Clinically Determine    In responding to this query please exercise your independent judgment.    The fact that a query is asked, does not imply that any particular answer is desired or expected.   Reviewed: Copd with Acute bronchitis, added to dx in discharge summary  Thank You,  Jerral Ralph  RN BSN CCDS Certified Clinical Documentation Specialist: Cell   [phone]  Health Information Management [place]  TO RESPOND TO THE THIS QUERY, FOLLOW THE INSTRUCTIONS BELOW:  1. If needed, update documentation for the patient's encounter via the notes activity.  2. Access this query again and click edit on the In [employer].  3. After updating, or not, click F2 to complete all highlighted (required) fields concerning your review. Select "additional  documentation in the medical record" OR "no additional documentation provided".  4. Click Sign note button.  5. The deficiency will fall out of your In Basket *Please let us know if you are not able to complete this workflow by phone or e-mail (listed below).   Copd with acute bronchitis is added to discharge summary, thanks

## 2012-07-01 NOTE — Plan of Care (Signed)
Problem: Phase I Progression Outcomes Goal: Flu/PneumoVaccines if indicated Outcome: Completed/Met Date Met:  07/01/12 Patient has had Pneumococcal vaccine according to admission history.

## 2012-07-01 NOTE — Consult Note (Signed)
ANTICOAGULATION CONSULT NOTE - Initial Consult  Pharmacy Consult for Coumadin Indication: atrial fibrillation  Allergies: Allergies  Allergen Reactions  . Aspirin Hives    Height/Weight: Height: 5\' 6"  (167.6 cm) Weight: 142 lb (64.411 kg) IBW/kg (Calculated) : 63.8  Vital Signs: BP 111/59  Pulse 69  Temp(Src) 98 F (36.7 C) (Oral)  Resp 20  Ht 5\' 6"  (1.676 m)  Wt 142 lb (64.411 kg)  BMI 22.93 kg/m2  SpO2 99%  Active Problems: Principal Problem:   CHF (congestive heart failure) Active Problems:   Atrial fibrillation   Labs:  Recent Labs  06/30/12 1005 06/30/12 1719 07/01/12 0445 07/01/12 0911  HGB 11.2* 11.0*  --   --   HCT 32.7* 31.8*  --   --   PLT 166 151  --   --   APTT  --  34  --   --   LABPROT 19.7*  --   --  20.3*  INR 1.73*  --   --  1.81*  CREATININE 1.13 1.03 1.16  --    Lab Results  Component Value Date   INR 1.81* 07/01/2012   INR 1.73* 06/30/2012   INR 2.04* 05/01/2012   Estimated Creatinine Clearance: 48.1 ml/min (by C-G formula based on Cr of 1.16).  Medical / Surgical History: Past Medical History  Diagnosis Date  . Diabetes mellitus   . Hypertension   . CHF (congestive heart failure)   . S/P CABG (coronary artery bypass graft)   . Gout   . Nephrolithiasis   . Prostate cancer, recur risk not determined whether low, med or high   . Coronary artery disease   . Hypercholesterolemia   . Black lung disease   . Kidney stone 09/2000  . Angina   . MI (myocardial infarction)     "I've had 2; didn't know it"  . CVA (cerebral vascular accident)     "I've had 2; didn't know it"  . Bradycardia, sinus   . On home oxygen therapy   . Prostate cancer 11/01/10    gleason 7, 8, 9, gold seeds 02/08/11  . Lung cancer   . COPD (chronic obstructive pulmonary disease)   . Shortness of breath   . GERD (gastroesophageal reflux disease)   . AICD (automatic cardioverter/defibrillator) present   . Arthritis    Past Surgical History  Procedure  Laterality Date  . Colonoscopy    . Esophagogastroduodenoscopy  02/19/2011    Procedure: ESOPHAGOGASTRODUODENOSCOPY (EGD);  Surgeon: Petra Kuba, MD;  Location: [facility] ENDOSCOPY;  Service: Endoscopy;  Laterality: N/A;  . Upper endoscpopy    . Coronary angioplasty with stent placement  07/1997; 08/1997;03/1998  . Coronary artery bypass graft  01/2000    CABG  X2  . Coronary artery bypass graft  07/2002    CABG X3  . Incision and drainage of wound  08/2002    right thigh; S/P EVH  . Insert / replace / remove pacemaker  1979; 1992; 01/2000;  . Insert / replace / remove pacemaker  09/2003; 06/2006    w/AICD  . Insert / replace / remove pacemaker  12/2004    pacmaker explant  . Shoulder arthroscopy w/ rotator cuff repair  05/2008    left    Medications:  Prescriptions prior to admission  Medication Sig Dispense Refill  . allopurinol (ZYLOPRIM) 300 MG tablet Take 300 mg by mouth daily.        [name] atorvastatin (LIPITOR) 20 MG tablet Take 20 mg by mouth daily.        [name]  CALCIUM-VITAMIN D PO Take 1 tablet by mouth daily.       . Cyanocobalamin (VITAMIN B-12 PO) Take 1 tablet by mouth 2 (two) times daily.       . digoxin (LANOXIN) 0.125 MG tablet Take 125 mcg by mouth daily.        . fish oil-omega-3 fatty acids 1000 MG capsule Take 1 g by mouth 2 (two) times daily.       . isosorbide mononitrate (IMDUR) 30 MG 24 hr tablet Take 30 mg by mouth daily.        [name] lisinopril (PRINIVIL,ZESTRIL) 10 MG tablet Take 10 mg by mouth every morning.       . metoprolol succinate (TOPROL-XL) 25 MG 24 hr tablet Take 25 mg by mouth every morning.      [name] omeprazole (PRILOSEC) 20 MG capsule Take 20 mg by mouth every morning.       . potassium chloride SA (K-DUR,KLOR-CON) 20 MEQ tablet Take 20 mEq by mouth daily.      . [DISCONTINUED] furosemide (LASIX) 20 MG tablet Take 20 mg by mouth every morning.       . [DISCONTINUED] warfarin (COUMADIN) 2.5 MG tablet Take 1.25-2.5 mg by mouth every evening. 1 (2.5 mg) tab everyday  except mondays and fridays pt takes 1/2 tab (1.25)      . NITROSTAT 0.4 MG SL tablet Place 0.4 mg under the tongue every 5 (five) minutes as needed for chest pain.        Scheduled:  . allopurinol  300 mg Oral Daily  . atorvastatin  20 mg Oral Daily  . azithromycin  250 mg Oral Daily  . calcium-vitamin D  1 tablet Oral BID  . digoxin  125 mcg Oral Daily  . [COMPLETED] furosemide  20 mg Intravenous Once  . furosemide  20 mg Intravenous BID  . isosorbide mononitrate  30 mg Oral Daily  . lisinopril  10 mg Oral Daily  . metoprolol succinate  25 mg Oral q morning - 10a  . omega-3 acid ethyl esters  1 g Oral BID  . pantoprazole  40 mg Oral Daily  . potassium chloride SA  20 mEq Oral Daily  . sodium chloride  3 mL Intravenous Q12H  . sodium chloride  3 mL Intravenous Q12H  . vitamin B-12  1,000 mcg Oral Daily  . [DISCONTINUED] azithromycin  500 mg Intravenous Q24H  . [DISCONTINUED] enoxaparin (LOVENOX) injection  40 mg Subcutaneous Q24H  . [DISCONTINUED] fish oil-omega-3 fatty acids  1 g Oral BID    Assessment:  79 y.o.male with complex medical history including PAF, is to continue Coumadin per protocol.  INR SUBtherapeutic today 1.81.  No bleeding complications noted   Home dose of Coumadin 2.5 mg daily except 1.25 mg Mon, Fri.  Goal of Therapy:   INR 2-3      Plan:   Coumadin 5 mg today. Daily INR's, CBC. Monitor for bleeding complications   Laurena Bering,  Pharm.D.. 07/01/2012,  10:53 AM

## 2012-07-01 NOTE — Progress Notes (Signed)
Patient evaluated for community based chronic disease management services with [facility] Care Management Program as a benefit of patient's [employer].  Spoke with patient at bedside to explain [facility] Care Management services. Patient has declined services at this time.  Left contact information and [facility] literature at bedside. Made inpatient Case Manager aware that [facility] Care Management consult. Of note, [facility] Care Management services does not replace or interfere with any services that are arranged by inpatient case management or social work.  For additional questions or referrals please contact Anibal Henderson BSN RN [facility] Liaison at [phone].

## 2012-07-01 NOTE — Progress Notes (Signed)
Cc: sob Hpi: 79 yo male with hx of chf, pafib, admitted for sob, chf. Pt is feeling better some diuresis and tx for bronchitis with zithromax.  Slight dry cough. Sob improved.  Denies fever, chills, cp, palp, n/v, diarrhea, brbpr, black stool.   Ros: negative for all organ systems except for + above  PMHx/Pshx: see prior HP for details  Shx: no tob, no etoh  Allergies: aspirin Medications reviewed  VSS: reviewed Heent: anicteric Neck: no jvd, no bruit, no tm Heart: rrr s1, s2 Lung: ctab Abd: soft, obese, nt, nd, +bs Ext: no c/c/e Skin: no rash Lymph: no adenoapthy  K=4.4  A/P: CHF: cont lasix iv Check bmp, bnp in am  Bronchitis: change to po zithromax  CAD s/p CABG: cont lipitor, imdur, metoprolol, lisinopril  Pafib: cont digoxin, cont metoprolol, coumadin pharmacy to dose  Gout: cont alloopurinol

## 2012-07-01 NOTE — Progress Notes (Signed)
Utilization Review Completed.   Terrence Wishon, RN, BSN Nurse Case Manager  [phone]  

## 2012-07-02 DIAGNOSIS — I359 Nonrheumatic aortic valve disorder, unspecified: Secondary | ICD-10-CM

## 2012-07-02 LAB — PROTIME-INR: Prothrombin Time: 19 seconds — ABNORMAL HIGH (ref 11.6–15.2)

## 2012-07-02 LAB — BASIC METABOLIC PANEL
BUN: 27 mg/dL — ABNORMAL HIGH (ref 6–23)
Chloride: 91 mEq/L — ABNORMAL LOW (ref 96–112)
GFR calc Af Amer: 58 mL/min — ABNORMAL LOW (ref >90)
GFR calc non Af Amer: 50 mL/min — ABNORMAL LOW (ref >90)
Potassium: 4.5 mEq/L (ref 3.5–5.1)

## 2012-07-02 LAB — CBC
MCH: 29.6 pg (ref 26.0–34.0)
MCV: 84.4 fL (ref 78.0–100.0)
Platelets: 158 10*3/uL (ref 150–400)
RBC: 3.78 MIL/uL — ABNORMAL LOW (ref 4.22–5.81)
RDW: 15.3 % (ref 11.5–15.5)
WBC: 5.1 10*3/uL (ref 4.0–10.5)

## 2012-07-02 LAB — PRO B NATRIURETIC PEPTIDE: Pro B Natriuretic peptide (BNP): 1582 pg/mL — ABNORMAL HIGH (ref 0–450)

## 2012-07-02 MED ORDER — WARFARIN SODIUM 5 MG PO TABS
5.0000 mg | ORAL_TABLET | Freq: Once | ORAL | Status: AC
  Administered 2012-07-02: 5 mg via ORAL
  Filled 2012-07-02: qty 1

## 2012-07-02 NOTE — Progress Notes (Signed)
  Echocardiogram 2D Echocardiogram has been performed.  Analeigha Nauman, [facility] 07/02/2012, 10:59 AM

## 2012-07-02 NOTE — Progress Notes (Signed)
ANTICOAGULATION CONSULT NOTE - Follow Up Consult   Pharmacy Consult for Coumadin Indication: PAF  Dosing Weight: 65 kg  Labs:  Recent Labs  06/30/12 1005 06/30/12 1719 07/01/12 0445 07/01/12 0911 07/02/12 0600  HGB 11.2* 11.0*  --   --  11.2*  HCT 32.7* 31.8*  --   --  31.9*  PLT 166 151  --   --  158  INR 1.73*  --   --  1.81* 1.65*  CREATININE 1.13 1.03 1.16  --  1.32   Lab Results  Component Value Date   INR 1.65* 07/02/2012   INR 1.81* 07/01/2012   INR 1.73* 06/30/2012   Estimated Creatinine Clearance: 42.3 ml/min (by C-G formula based on Cr of 1.32).  Medications:  Medication Sig  . [DISCONTINUED] warfarin (COUMADIN) 2.5 MG tablet Take 1.25-2.5 mg by mouth every evening. 1 (2.5 mg) tab everyday except mondays and fridays pt takes 1/2 tab (1.25)   Scheduled:  . allopurinol  300 mg Oral Daily  . atorvastatin  20 mg Oral Daily  . azithromycin  250 mg Oral Daily  . calcium-vitamin D  1 tablet Oral BID  . digoxin  125 mcg Oral Daily  . furosemide  20 mg Intravenous BID  . isosorbide mononitrate  30 mg Oral Daily  . lisinopril  10 mg Oral Daily  . metoprolol succinate  25 mg Oral q morning - 10a  . omega-3 acid ethyl esters  1 g Oral BID  . pantoprazole  40 mg Oral Daily  . potassium chloride SA  20 mEq Oral Daily  . sodium chloride  3 mL Intravenous Q12H  . sodium chloride  3 mL Intravenous Q12H  . vitamin B-12  1,000 mcg Oral Daily  . [COMPLETED] warfarin  5 mg Oral ONCE-1800  . Warfarin - Pharmacist Dosing Inpatient   Does not apply q1800   Assessment:  79 y.o.male with complex medical history including PAF, is to continue Coumadin per protocol.   INR SUBtherapeutic today 1.65. No bleeding complications noted   Goal of Therapy:  INR 2-3   Plan:   Repeat Coumadin 5 mg today  Daily INR's, CBC. Monitor for bleeding complications   Laurena Bering, Pharm.D.  07/02/2012,12:50 PM

## 2012-07-03 ENCOUNTER — Inpatient Hospital Stay (HOSPITAL_COMMUNITY): Payer: PRIVATE HEALTH INSURANCE

## 2012-07-03 LAB — BASIC METABOLIC PANEL
Calcium: 9.7 mg/dL (ref 8.4–10.5)
GFR calc Af Amer: 66 mL/min — ABNORMAL LOW (ref >90)
GFR calc non Af Amer: 57 mL/min — ABNORMAL LOW (ref >90)
Glucose, Bld: 150 mg/dL — ABNORMAL HIGH (ref 70–99)
Potassium: 4.4 mEq/L (ref 3.5–5.1)
Sodium: 130 mEq/L — ABNORMAL LOW (ref 135–145)

## 2012-07-03 LAB — PRO B NATRIURETIC PEPTIDE: Pro B Natriuretic peptide (BNP): 1294 pg/mL — ABNORMAL HIGH (ref 0–450)

## 2012-07-03 LAB — CBC
HCT: 30.8 % — ABNORMAL LOW (ref 39.0–52.0)
MCHC: 35.1 g/dL (ref 30.0–36.0)
Platelets: 161 10*3/uL (ref 150–400)
RDW: 14.9 % (ref 11.5–15.5)
WBC: 5.3 10*3/uL (ref 4.0–10.5)

## 2012-07-03 LAB — PROTIME-INR
INR: 1.59 — ABNORMAL HIGH (ref 0.00–1.49)
Prothrombin Time: 18.5 seconds — ABNORMAL HIGH (ref 11.6–15.2)

## 2012-07-03 MED ORDER — LORATADINE 10 MG PO TABS: 10.0000 mg | ORAL_TABLET | Freq: Every day | ORAL | Status: DC

## 2012-07-03 MED ORDER — WARFARIN SODIUM 5 MG PO TABS: 5.0000 mg | ORAL_TABLET | Freq: Once | ORAL | Status: DC

## 2012-07-03 MED ORDER — WARFARIN SODIUM 5 MG PO TABS
5.0000 mg | ORAL_TABLET | Freq: Once | ORAL | Status: AC
  Administered 2012-07-03: 5 mg via ORAL
  Filled 2012-07-03: qty 1

## 2012-07-03 MED ORDER — WARFARIN SODIUM 2.5 MG PO TABS: 1.2500 mg | ORAL_TABLET | Freq: Every evening | ORAL | Status: DC

## 2012-07-03 MED ORDER — ALBUTEROL SULFATE (5 MG/ML) 0.5% IN NEBU: 2.5000 mg | INHALATION_SOLUTION | Freq: Four times a day (QID) | RESPIRATORY_TRACT | Status: DC | PRN

## 2012-07-03 MED ORDER — FUROSEMIDE 20 MG PO TABS
20.0000 mg | ORAL_TABLET | Freq: Two times a day (BID) | ORAL | Status: DC
  Administered 2012-07-03 (×2): 20 mg via ORAL
  Filled 2012-07-03 (×3): qty 1

## 2012-07-03 MED ORDER — AZITHROMYCIN 250 MG PO TABS: ORAL_TABLET | ORAL | Status: AC

## 2012-07-03 MED ORDER — FUROSEMIDE 20 MG PO TABS: 20.0000 mg | ORAL_TABLET | Freq: Two times a day (BID) | ORAL | Status: DC

## 2012-07-03 MED ORDER — LORATADINE 10 MG PO TABS
10.0000 mg | ORAL_TABLET | Freq: Every day | ORAL | Status: DC
  Administered 2012-07-03: 10 mg via ORAL
  Filled 2012-07-03 (×2): qty 1

## 2012-07-03 NOTE — Progress Notes (Signed)
Cc: sob  Hpi: 79 yo male with hx of chf, pafib, admitted for sob, chf. Pt is feeling better some diuresis and tx for bronchitis with zithromax. Slight dry cough has improved. Sob improved. Denies fever, chills, cp, palp, n/v, diarrhea, brbpr, black stool.  Ros: negative for all organ systems except for + above   PMHx/Pshx: see prior HP for details  Shx: no tob, no etoh   Allergies: aspirin  Medications reviewed   VSS: reviewed  Heent: anicteric  Neck: no jvd, no bruit, no tm  Heart: rrr s1, s2  Lung: ctab  Abd: soft, obese, nt, nd, +bs  Ext: no c/c/e  Skin: no rash  Lymph: no adenoapthy     A/P:  CHF: convert to  lasix po  Check bmp, bnp in am  CXR tomorrow Bronchitis: change to po zithromax  CAD s/p CABG: cont lipitor, imdur, metoprolol, lisinopril  Pafib: cont digoxin, cont metoprolol, coumadin pharmacy to dose  Gout: cont alloopurinol  Dispo: if pt clinically better tomorrow, discharge to home

## 2012-07-03 NOTE — Progress Notes (Signed)
ANTICOAGULATION CONSULT NOTE - Follow Up Consult   Pharmacy Consult :  Coumadin Indication: atrial fibrillation  Dosing Weight: 65 kg  Labs:  Recent Labs  06/30/12 1719 07/01/12 0445 07/01/12 0911 07/02/12 0600 07/03/12 0435 07/03/12 0842  HGB 11.0*  --   --  11.2* 10.8*  --   HCT 31.8*  --   --  31.9* 30.8*  --   PLT 151  --   --  158 161  --   INR  --   --  1.81* 1.65* 1.59*  --   CREATININE 1.03 1.16  --  1.32  --  1.19   Lab Results  Component Value Date   INR 1.59* 07/03/2012   INR 1.65* 07/02/2012   INR 1.81* 07/01/2012   Estimated Creatinine Clearance: 46.9 ml/min (by C-G formula based on Cr of 1.19).  Medications:  Scheduled:  . allopurinol  300 mg Oral Daily  . atorvastatin  20 mg Oral Daily  . azithromycin  250 mg Oral Daily  . calcium-vitamin D  1 tablet Oral BID  . digoxin  125 mcg Oral Daily  . furosemide  20 mg Oral BID  . isosorbide mononitrate  30 mg Oral Daily  . lisinopril  10 mg Oral Daily  . metoprolol succinate  25 mg Oral q morning - 10a  . omega-3 acid ethyl esters  1 g Oral BID  . pantoprazole  40 mg Oral Daily  . potassium chloride SA  20 mEq Oral Daily  . sodium chloride  3 mL Intravenous Q12H  . sodium chloride  3 mL Intravenous Q12H  . vitamin B-12  1,000 mcg Oral Daily  . [COMPLETED] warfarin  5 mg Oral ONCE-1800  . Warfarin - Pharmacist Dosing Inpatient   Does not apply q1800  . [DISCONTINUED] furosemide  20 mg Intravenous BID    Assessment:  79 y.o.male with complex medical history including PAF, is continuing Coumadin per protocol  INR remains SUBtherapeutic despite increased Coumadin doses.  No bleeding complications noted   Goal of Therapy:  INR 2-3   Plan:   Coumadin 5 mg today. Daily INR's, CBC. , Monitor for bleeding complications   Laurena Bering, Pharm.D.  07/03/2012,1:01 PM

## 2012-07-03 NOTE — Progress Notes (Signed)
Medications reviewed, verbalized understanding.  Verbalized understanding of discharge instructions.  Released as ordered.

## 2012-07-03 NOTE — Discharge Summary (Signed)
Physician Discharge Summary  SANJEEV MAIN AVW:[id number] DOB: 1933-02-06 DOA: 06/30/2012  PCP: Pearson Grippe, MD  Admit date: 06/30/2012 Discharge date: 07/03/2012  Time spent: 45  minutes  Recommendations for Outpatient Follow-up:  1. Please check your weight daily and call Dr. Selena Batten if you gain more than 2 lbs in 1 day or 5lbs in 1 week   2. Please make appt to have INR in 6 days,  3. Please follow up with Dr. Selena Batten in 1 week, call for appt, if can not get one please call [phone],   Discharge Diagnoses:  Active Problems:   Atrial fibrillation CHF (EF 20%) Mild MR Pafib Allerghic rhinitis Bronchitis Hyponatremia  Discharge Condition: stable  Diet recommendation: cardiac 2gm salt restriction   Filed Weights   07/01/12 0456 07/02/12 0543 07/03/12 0500  Weight: 142 lb (64.411 kg) 142 lb 8 oz (64.638 kg) 143 lb 3.2 oz (64.955 kg)    History of present illness:  79 yo male with hx of CHF c/o SOB the morning of admission.    Hospital Course:  79yo male with hx of CHF (ef 20%), mild MR, Pafib, c/o sob.  Pt was seen in ED on 07/03/2012 and found to have CHF.  Cardiac markers negative. Pro BNP elevated about 2111  Pt was diuresed with lasix 20mg  iv bid and felt better.  Pt was also tx with zithromax for bronchitis which helped per the patient. Pt denies cp, palp, orhopnea, pnd, lower ext edema. Cardiac echo showed EF 20%, which is about baseline, and mild mr, which is his baseline as well.  Pt appears to be doing better and is stable for discharge.     Procedures:  Cardiac echo  Consultations:  None  Discharge Exam: Filed Vitals:   07/03/12 1144 07/03/12 1145 07/03/12 1414 07/03/12 1510  BP:  120/62 124/56   Pulse: 62  67   Temp:   98.6 F (37 C)   TempSrc:   Oral   Resp:   20   Height:      Weight:      SpO2:   96% 98%    General: axox3*  Discharge Instructions   Future Appointments Provider Department Dept Phone   09/29/2012 8:10 AM [facility]  [facility] Main Office [place]) [phone]       Medication List    TAKE these medications       allopurinol 300 MG tablet  Commonly known as:  ZYLOPRIM  Take 300 mg by mouth daily.     atorvastatin 20 MG tablet  Commonly known as:  LIPITOR  Take 20 mg by mouth daily.     azithromycin 250 MG tablet  Commonly known as:  ZITHROMAX  250mg  po qday x 3 days     CALCIUM-VITAMIN D PO  Take 1 tablet by mouth daily.     digoxin 0.125 MG tablet  Commonly known as:  LANOXIN  Take 125 mcg by mouth daily.     fish oil-omega-3 fatty acids 1000 MG capsule  Take 1 g by mouth 2 (two) times daily.     furosemide 20 MG tablet  Commonly known as:  LASIX  Take 1 tablet (20 mg total) by mouth 2 (two) times daily.     isosorbide mononitrate 30 MG 24 hr tablet  Commonly known as:  IMDUR  Take 30 mg by mouth daily.     lisinopril 10 MG tablet  Commonly known as:  PRINIVIL,ZESTRIL  Take 10 mg by mouth every  morning.     loratadine 10 MG tablet  Commonly known as:  CLARITIN  Take 1 tablet (10 mg total) by mouth daily.     metoprolol succinate 25 MG 24 hr tablet  Commonly known as:  TOPROL-XL  Take 25 mg by mouth every morning.     NITROSTAT 0.4 MG SL tablet  Generic drug:  nitroGLYCERIN  Place 0.4 mg under the tongue every 5 (five) minutes as needed for chest pain.     omeprazole 20 MG capsule  Commonly known as:  PRILOSEC  Take 20 mg by mouth every morning.     potassium chloride SA 20 MEQ tablet  Commonly known as:  K-DUR,KLOR-CON  Take 20 mEq by mouth daily.     VITAMIN B-12 PO  Take 1 tablet by mouth 2 (two) times daily.     warfarin 2.5 MG tablet  Commonly known as:  COUMADIN  Take 0.5-1 tablets (1.25-2.5 mg total) by mouth every evening. 1 (2.5 mg) tab everyday except mondays and fridays pt takes 1/2 tab (1.25)     warfarin 5 MG tablet  Commonly known as:  COUMADIN  Take 1 tablet (5 mg total) by mouth one time only at 6 PM.           Follow-up  Information   Follow up with Pearson Grippe, MD In 1 week.   Contact information:   [address] [place] [address] [phone]        The results of significant diagnostics from this hospitalization (including imaging, microbiology, ancillary and laboratory) are listed below for reference.    Significant Diagnostic Studies: Dg Chest 2 View  07/03/2012  *RADIOLOGY REPORT*  Clinical Data: CHF, shortness of breath  CHEST - 2 VIEW  Comparison: 06/30/2012  Findings: An implantable defibrillator is again noted. Postsurgical changes are again seen.  Cardiac shadow is stable. The lungs are well aerated without focal infiltrate or sizable effusion.  No significant vascular congestion is noted.  IMPRESSION: No acute abnormality seen.   Original Report Authenticated By: Alcide Clever, M.D.    Ct Angio Chest Pe W/cm &/or Wo Cm  06/30/2012  **ADDENDUM** CREATED: 06/30/2012 13:11:59  Due to a PACS technical problem at the time of dictation, prior examinations were not available.  Today's examination is now compared to CT chest 03/24/2011.  There is subpleural scarring in the left lower lobe, adjacent to pleural calcification, unchanged from 03/24/2011.  Scarring is also seen along the fissures on the right.  Left ventricular calcification is unchanged.  **END ADDENDUM** SIGNED BY: Reyes Ivan, M.D.   06/30/2012  *RADIOLOGY REPORT*  Clinical Data: Shortness of breath and chest pain.  CT ANGIOGRAPHY CHEST  Technique:  Multidetector CT imaging of the chest using the standard protocol during bolus administration of intravenous contrast. Multiplanar reconstructed images including MIPs were obtained and reviewed to evaluate the vascular anatomy.  Contrast: OMNIPAQUE IOHEXOL 350 MG/ML SOLN  Comparison: None.  Findings: Image quality is somewhat degraded by respiratory motion. No definite pulmonary embolus.  Mediastinal lymph nodes are not enlarged by CT size criteria.  No hilar or axillary  adenopathy. Heart is enlarged.  No pericardial effusion.  Upper esophagus is mildly dilated, nonspecific.  Again, respiratory motion degrades image quality.  Probable scarring along the minor and right major fissure.  Mild scattered air space disease at the lung bases, greatest in the left lower lobe.  Subpleural density in the left lower lobe is seen in association with pleural  calcification and may be chronic (example image 70).  Incidental imaging of the upper abdomen shows no acute findings. No worrisome lytic or sclerotic lesions.  Degenerative changes are seen in the spine.  Old posterior left rib fractures.  IMPRESSION:  1.  Image quality is somewhat degraded by respiratory motion.  No definite pulmonary embolus. 2.  Mild scattered air space disease at the lung bases may be due to expiratory phase imaging.  Difficult to exclude an infectious or inflammatory process. 3.  Subpleural consolidation in the left lower lobe is of unknown chronicity and may be chronic given adjacent pleural calcification. Cannot exclude an acute infectious/inflammatory etiology.   Original Report Authenticated By: Leanna Battles, M.D.    Dg Chest Port 1 View  06/30/2012  *RADIOLOGY REPORT*  Clinical Data: Chest pain and shortness of breath.  PORTABLE CHEST - 1 VIEW  Comparison: 04/22/2012  Findings: There is evidence of diffuse interstitial edema which is slightly more prominent in the left lung than the right.  A small left pleural effusion is present.  Heart size is stable and there is a stable appearance to an ICD/pacing device as well as old pacer wires.  IMPRESSION: Diffuse interstitial edema consistent with congestive heart failure.  A small left pleural effusion is present.   Original Report Authenticated By: Irish Lack, M.D.     Microbiology: Recent Results (from the past 240 hour(s))  MRSA PCR SCREENING     Status: None   Collection Time    06/30/12  5:54 PM      Result Value Range Status   MRSA by PCR  NEGATIVE  NEGATIVE Final   Comment:            The GeneXpert MRSA Assay (FDA     approved for NASAL specimens     only), is one component of a     comprehensive MRSA colonization     surveillance program. It is not     intended to diagnose MRSA     infection nor to guide or     monitor treatment for     MRSA infections.     Labs: Basic Metabolic Panel:  Recent Labs Lab 06/30/12 1005 06/30/12 1719 07/01/12 0445 07/02/12 0600 07/03/12 0842  NA 134*  --  134* 131* 130*  K 4.1  --  4.4 4.5 4.4  CL 95*  --  93* 91* 91*  CO2 27  --  31 30 28   GLUCOSE 156*  --  93 108* 150*  BUN 26*  --  25* 27* 27*  CREATININE 1.13 1.03 1.16 1.32 1.19  CALCIUM 9.5  --  9.6 9.7 9.7   Liver Function Tests:  Recent Labs Lab 07/01/12 0445  AST 21  ALT 22  ALKPHOS 85  BILITOT 0.7  PROT 7.1  ALBUMIN 3.0*   No results found for this basename: LIPASE, AMYLASE,  in the last 168 hours No results found for this basename: AMMONIA,  in the last 168 hours CBC:  Recent Labs Lab 06/30/12 1005 06/30/12 1719 07/02/12 0600 07/03/12 0435  WBC 5.5 4.5 5.1 5.3  HGB 11.2* 11.0* 11.2* 10.8*  HCT 32.7* 31.8* 31.9* 30.8*  MCV 86.3 85.9 84.4 83.7  PLT 166 151 158 161   Cardiac Enzymes:  Recent Labs Lab 06/30/12 1719 06/30/12 2228 07/01/12 0445  CKTOTAL 54  --   --   CKMB 2.1  --   --   TROPONINI <0.30 <0.30 <0.30   BNP: BNP (last 3 results)  Recent Labs  07/01/12 0445 07/02/12 0600 07/03/12 0842  PROBNP 2111.0* 1582.0* 1294.0*   CBG: No results found for this basename: GLUCAP,  in the last 168 hours   Exam: Heent: anicteric Neck: no jvd, no bruit, no tm Heart: rrr, s1, s2 Lung: ctab Abd: soft, obses, nt, nd, +bs EXt: no c/c/e Skin: no rash Lymph: no adenoapthy Neuro: nonfocal   Signed:  Pearson Grippe  [facility] 07/03/2012, 5:57 PM

## 2012-07-10 ENCOUNTER — Encounter: Payer: Self-pay | Admitting: Internal Medicine

## 2012-07-11 ENCOUNTER — Telehealth: Payer: Self-pay | Admitting: Internal Medicine

## 2012-07-13 IMAGING — CT CT HEAD W/O CM
1 of 2 series · 15 of 30 positions shown, 19 images · non-contrast
Comparison: 10/30/2008; 10/29/2008

CLINICAL DATA: Syncopal episode

CT HEAD WITHOUT CONTRAST
TECHNIQUE: Contiguous axial images were obtained from the base of
the skull through the vertex without contrast.

[Series 3: recon 2: brain · axial · 0.47mm/px · z∈[+141,+274]mm · 15 of 56 slices shown, 19 images]
[im 3/56  brain]
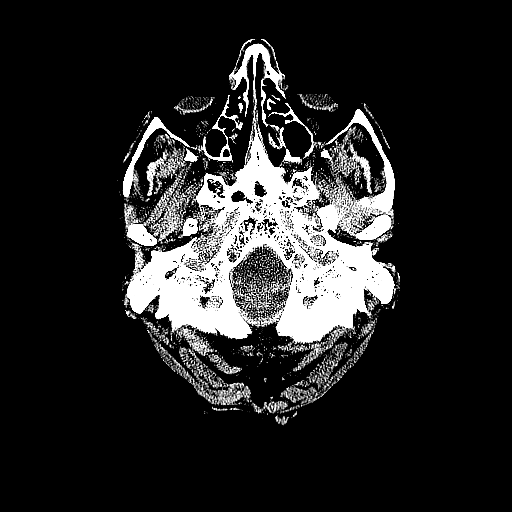
[im 3/56  bone]
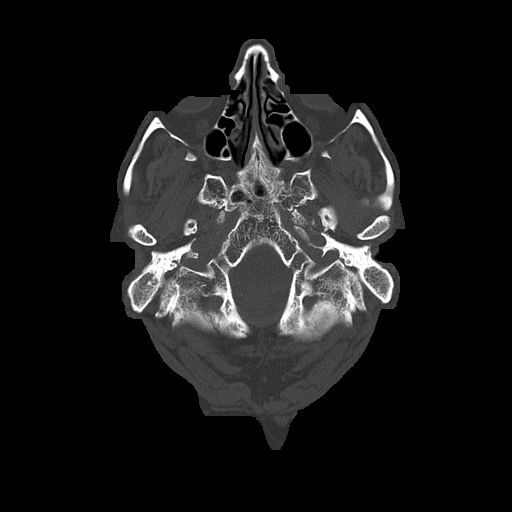
[im 6/56  brain]
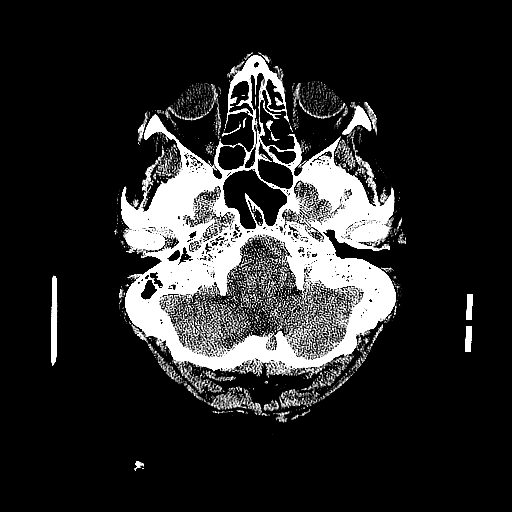
[im 12/56  brain]
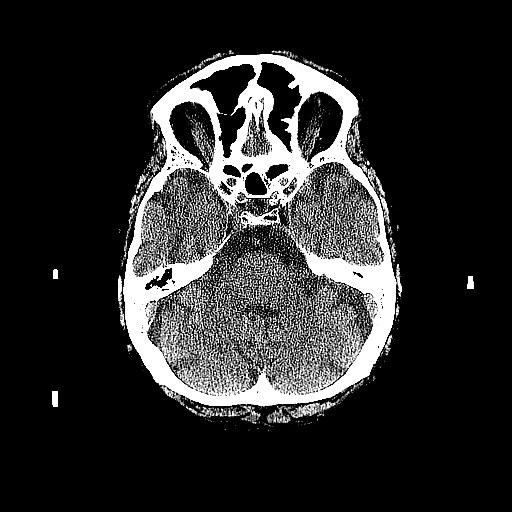
[im 15/56  brain]
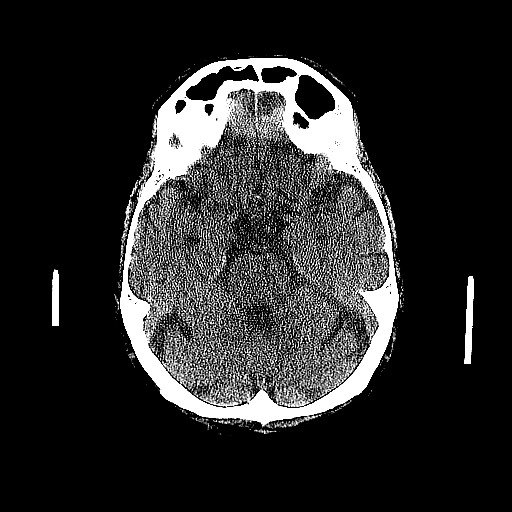
[im 18/56  brain]
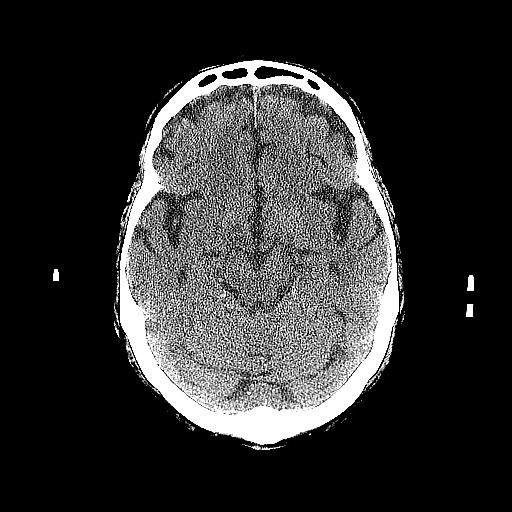
[im 18/56  bone]
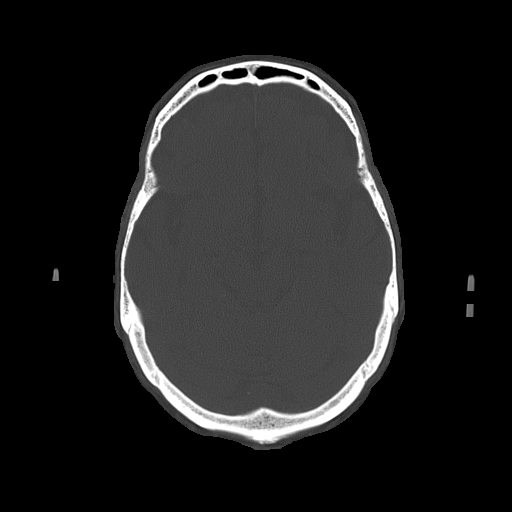
[im 21/56  brain]
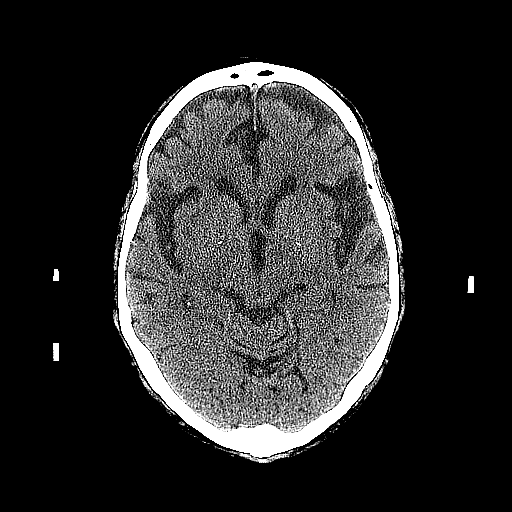
[im 24/56  brain]
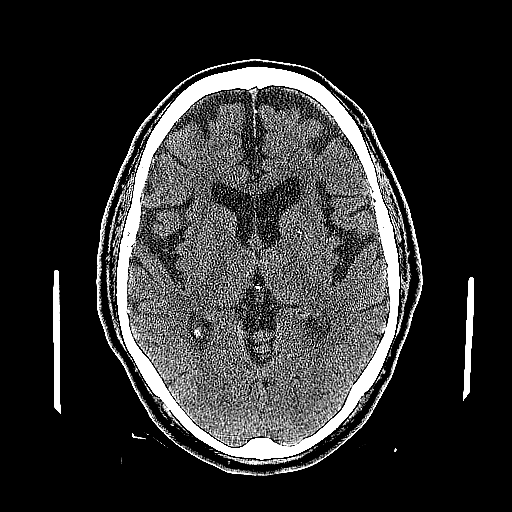
[im 29/56  brain]
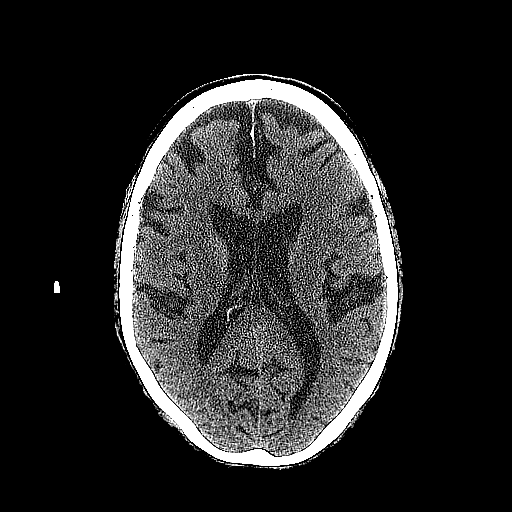
[im 32/56  brain]
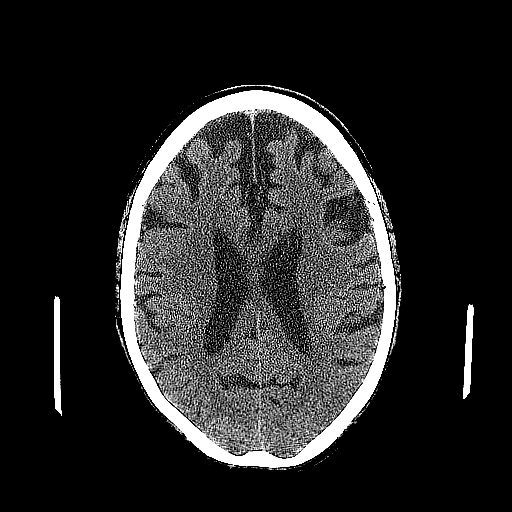
[im 32/56  bone]
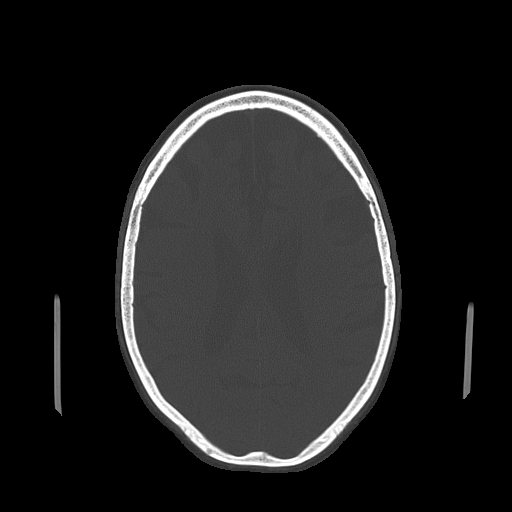
[im 35/56  brain]
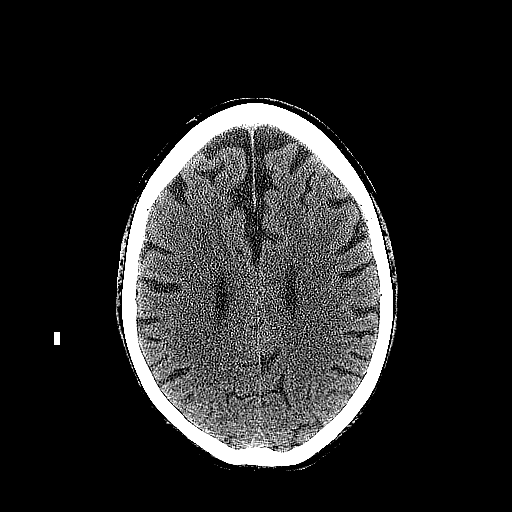
[im 38/56  brain]
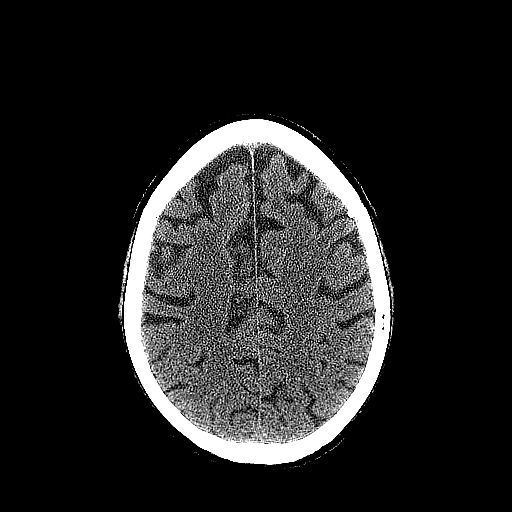
[im 41/56  brain]
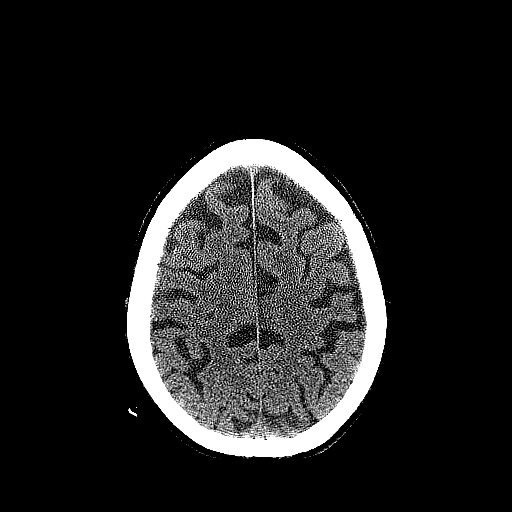
[im 47/56  brain]
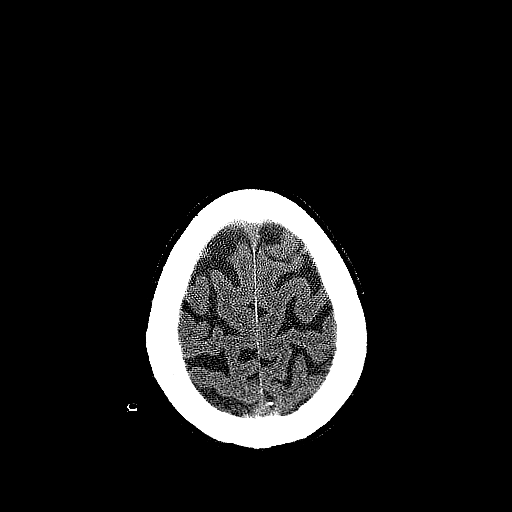
[im 47/56  bone]
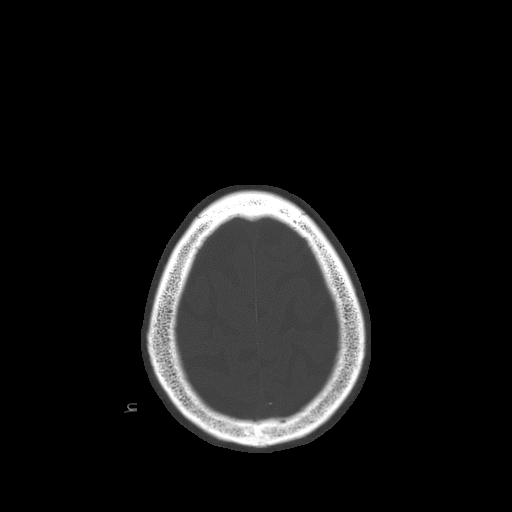
[im 50/56  brain]
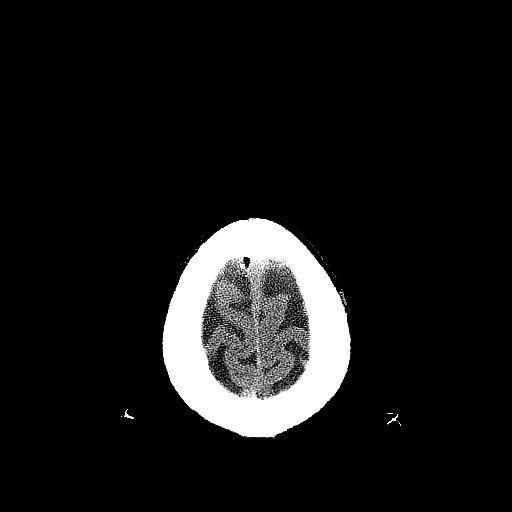
[im 53/56  brain]
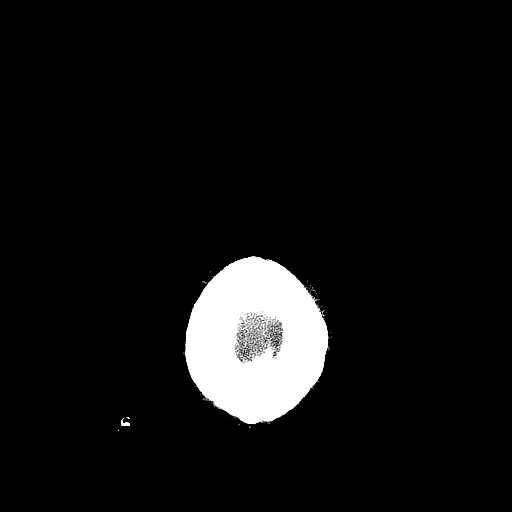

[15 of 30 positions shown; findings below may reference images not displayed]

FINDINGS: Redemonstrated mild diffuse cerebral atrophy
withcommiserate dilatation of the ventricular system.
Redemonstrated periventricular hypodensities compatible
microvascular ischemic disease.  Calcifications within the
bilateral basal ganglia.  The gray-white differentiation otherwise
well maintained without CT evidence of acute large territory
infarct.  No intraparenchymal or extra-axial mass or hemorrhage.
No midline shift.  Paranasal sinuses and mastoid air cells are
normal.  Incidental note is again made of an osteoma within the
left frontal sinus.  Regional soft tissues are normal.  No
calvarial fracture. Vascular calcifications.
IMPRESSION: Advanced atrophy and microvascular ischemic disease without
definite superimposed acute intracranial process.

## 2012-07-13 IMAGING — CR DG CHEST 2V
2 series · 2 of 2 positions shown · non-contrast
Comparison: 08/29/2010

CLINICAL DATA: Syncope.  History of lung cancer.  Patient on home
oxygen.

CHEST - 2 VIEW

[w chest pa]
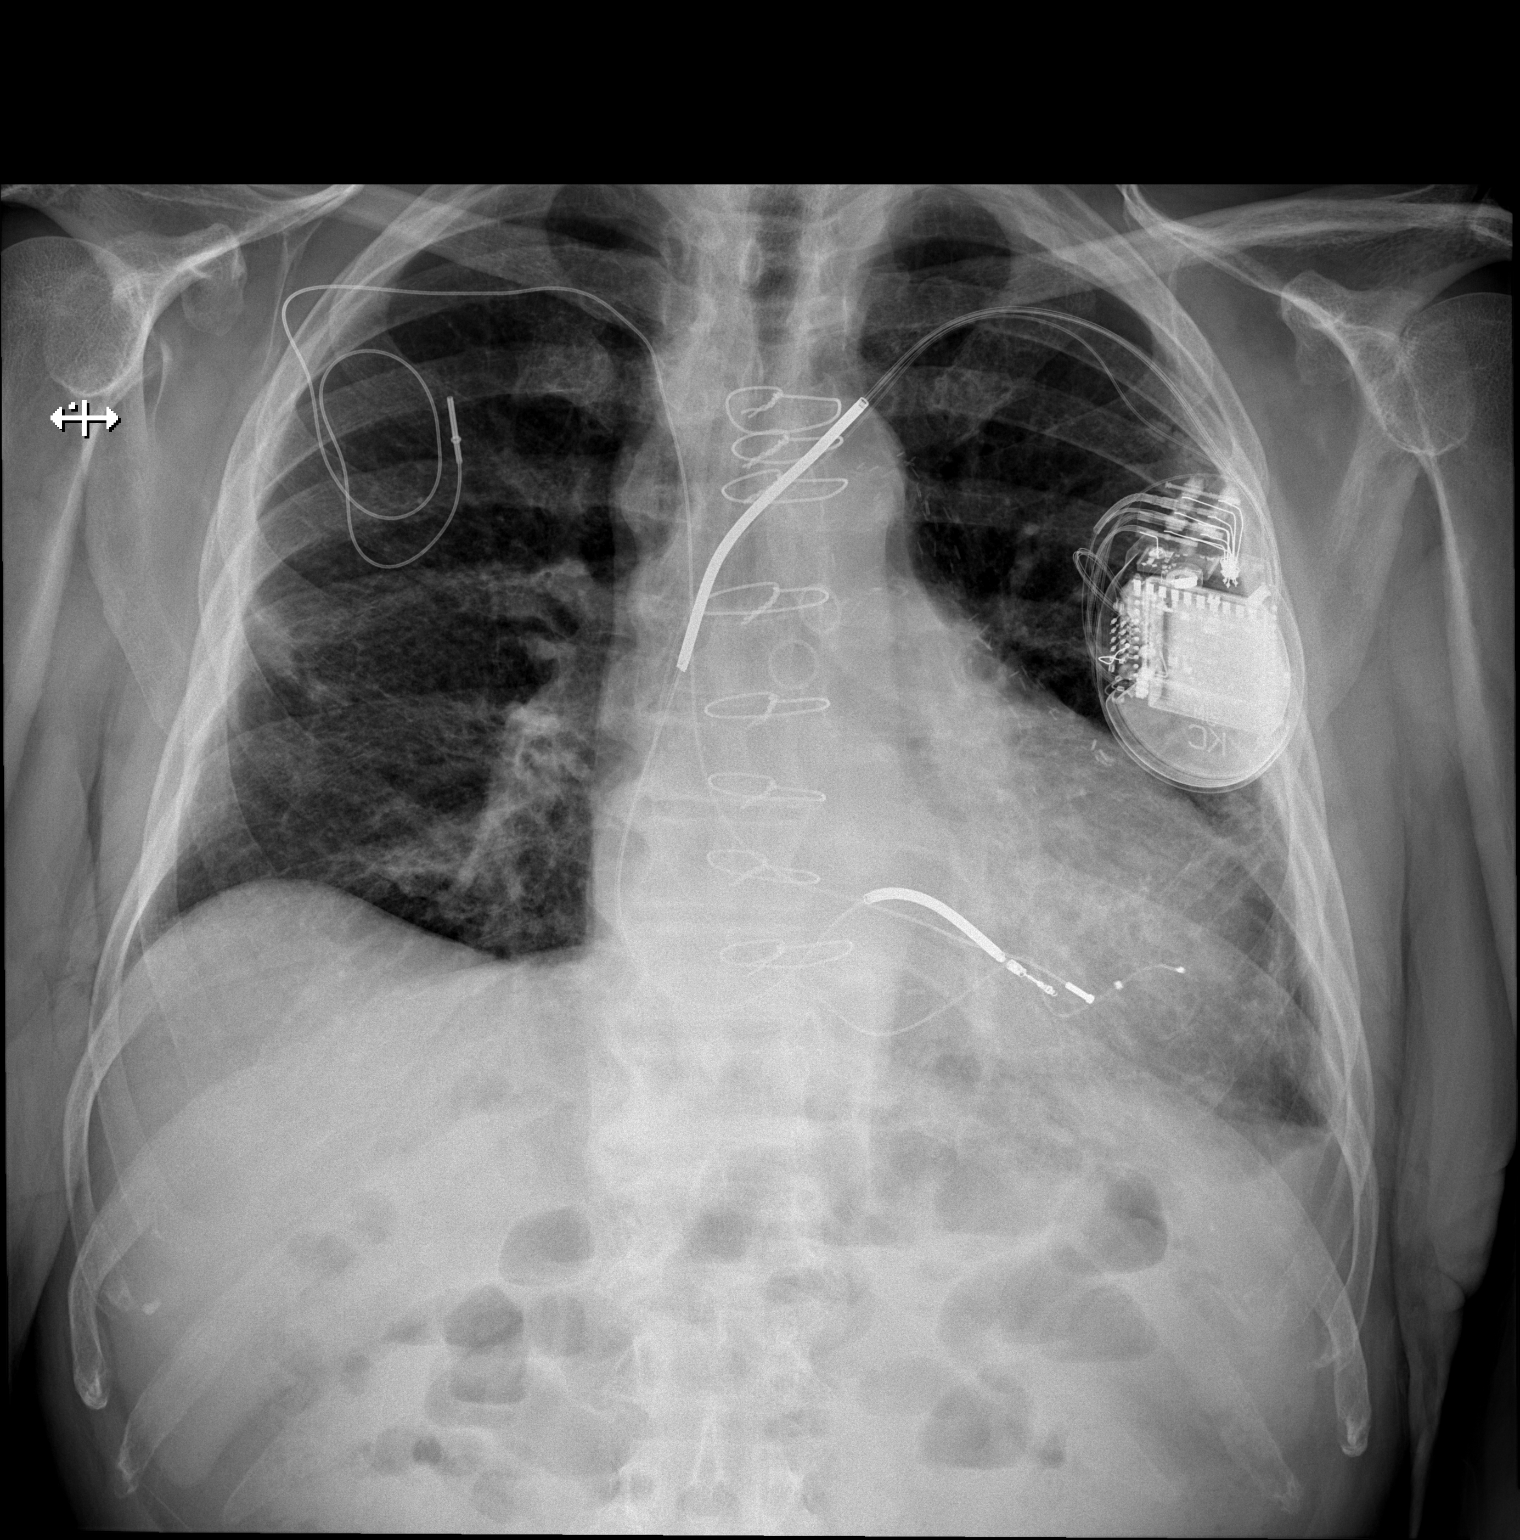

[w chest lat]
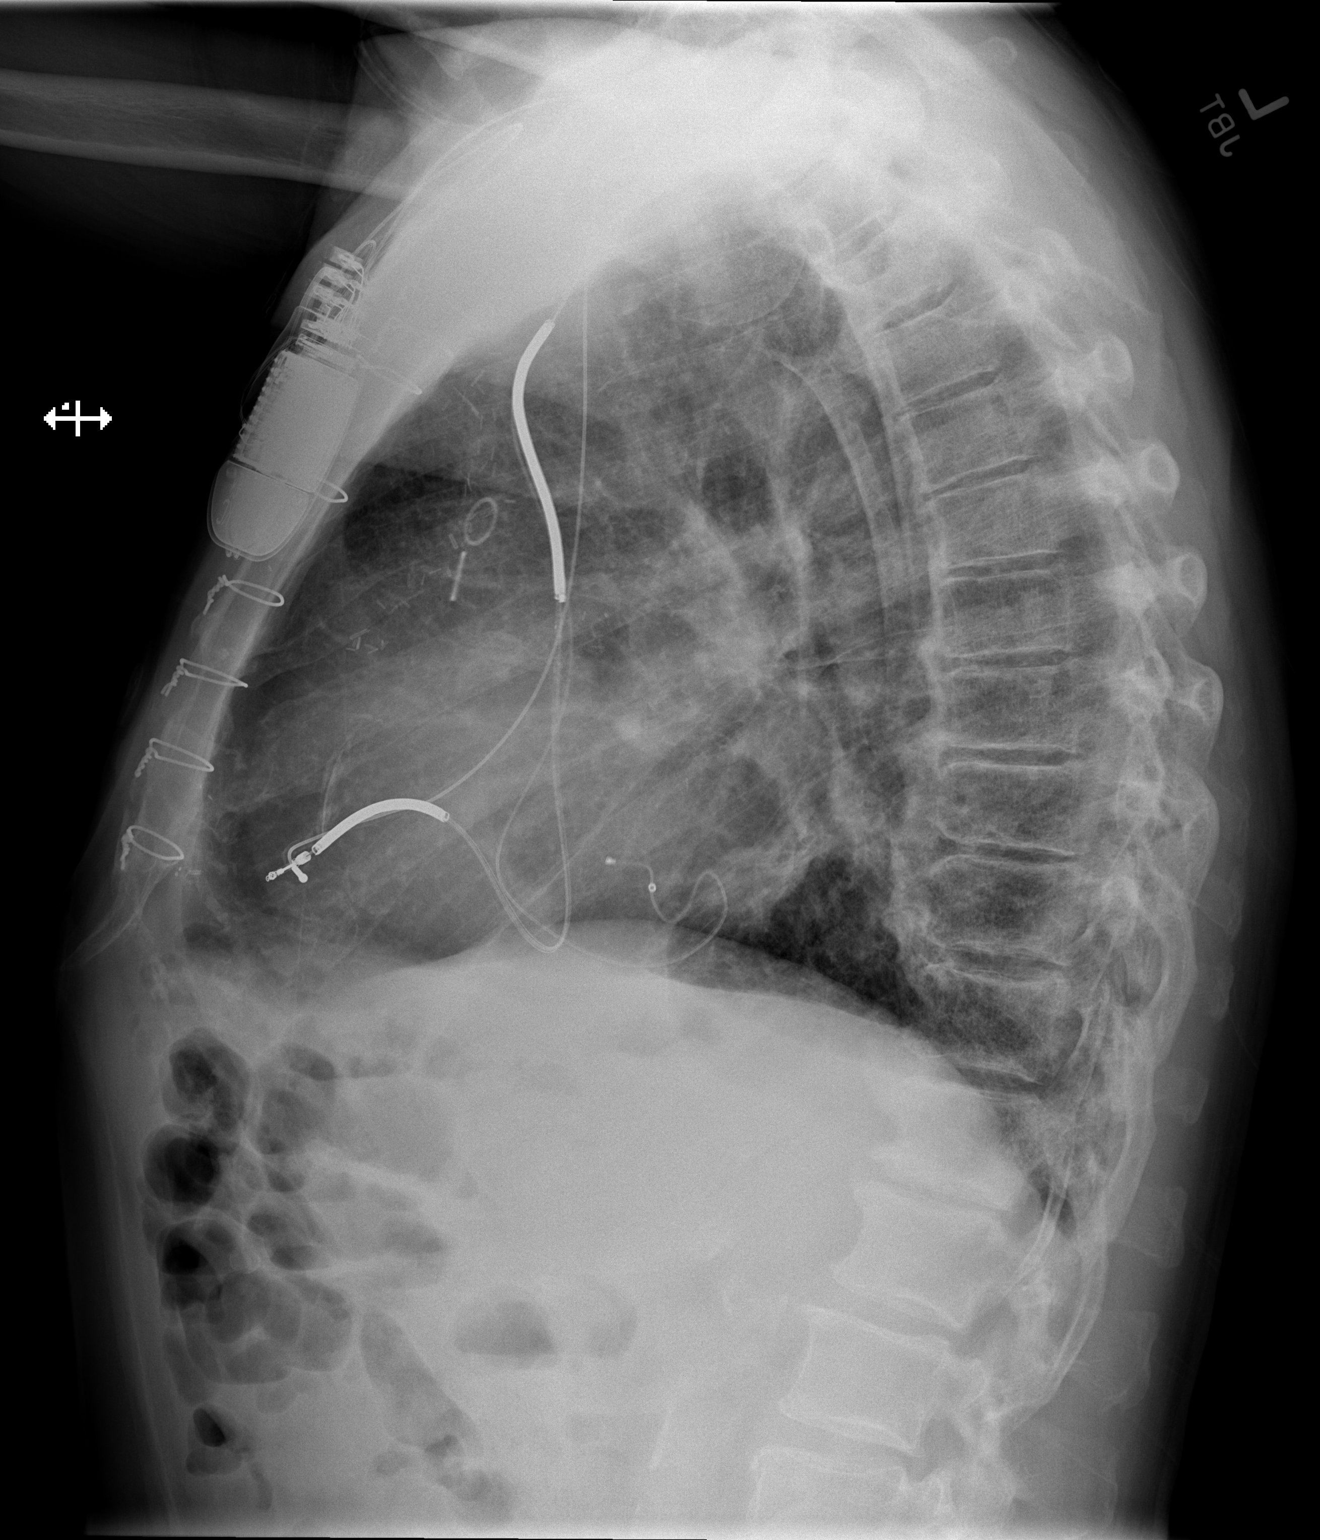

[2 of 2 positions shown; findings below may reference images not displayed]

FINDINGS: Stable postoperative changes and cardiac pacemaker.
Cardiac enlargement with normal pulmonary vascularity.  Mild
diffuse interstitial pattern suggesting interstitial fibrosis.
Blunting of the left costophrenic angle might represent fluid or
pleural thickening.  Emphysematous changes in the lungs. Fibrosis
in the right mid lung laterally.  No focal airspace consolidation.
No pneumothorax.  Degenerative changes in the spine.  No
significant change since the previous study.
IMPRESSION: Emphysematous changes with probable chronic fibrosis and left
pleural thickening.  Cardiac enlargement.  No focal airspace
disease.

## 2012-07-14 IMAGING — US US RENAL
1 series · 14 of 25 positions shown · non-contrast
Comparison: CT abdomen and pelvis 11/28/2010

CLINICAL DATA: Evaluate for obstruction. Elevated BUN and
creatinine.

RENAL/URINARY TRACT ULTRASOUND COMPLETE

[Series 1: us renal · 0.25mm/px · 14 of 30 slices shown]
[im 1/30]
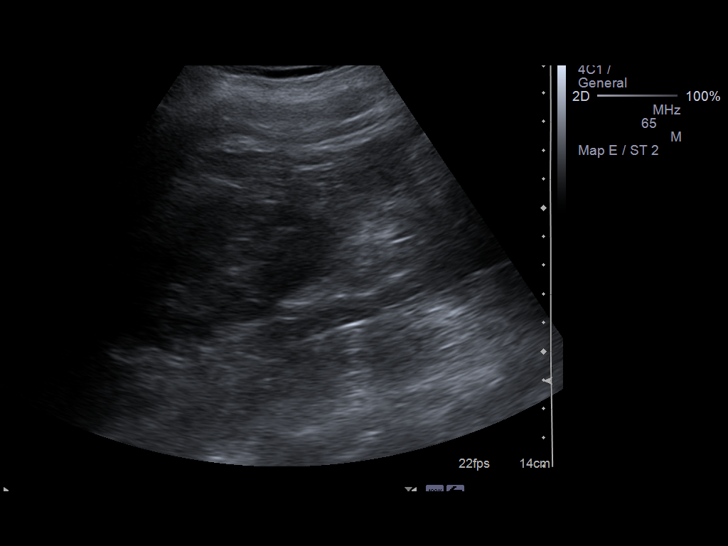
[im 3/30]
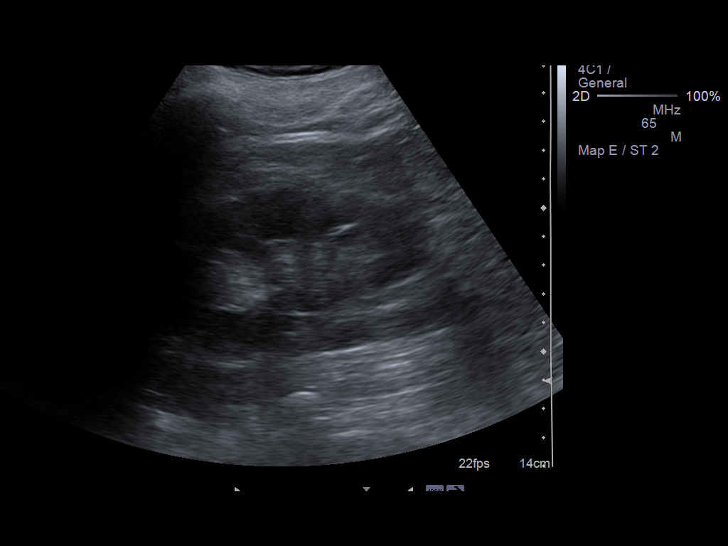
[im 5/30]
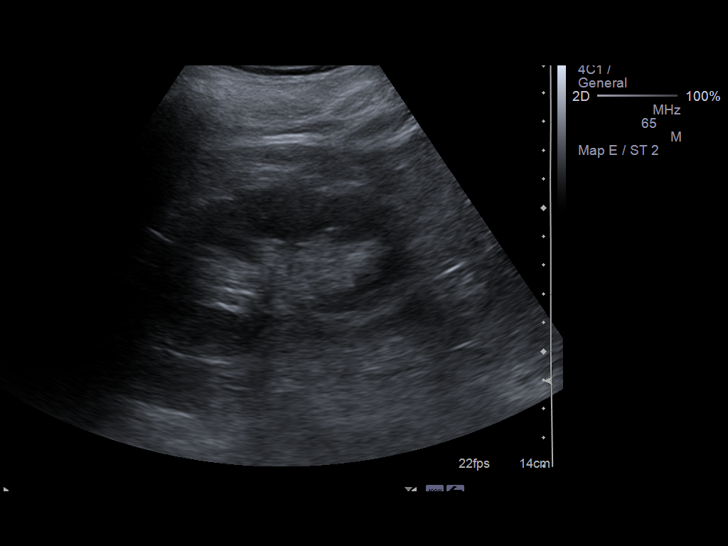
[im 8/30]
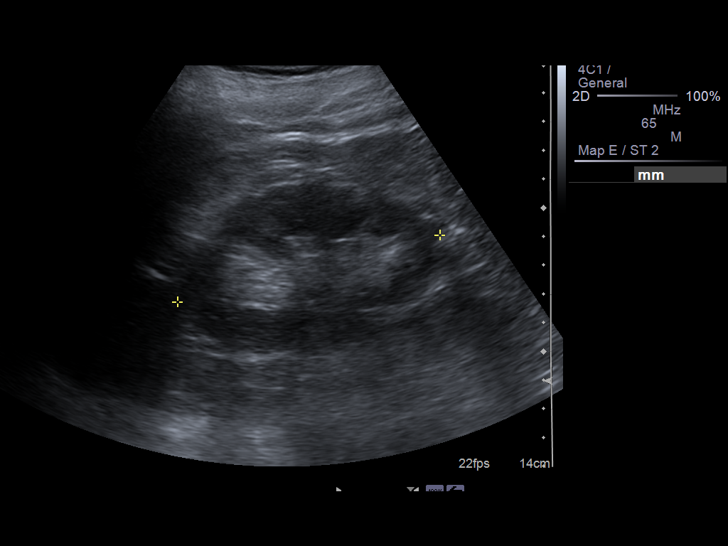
[im 10/30]
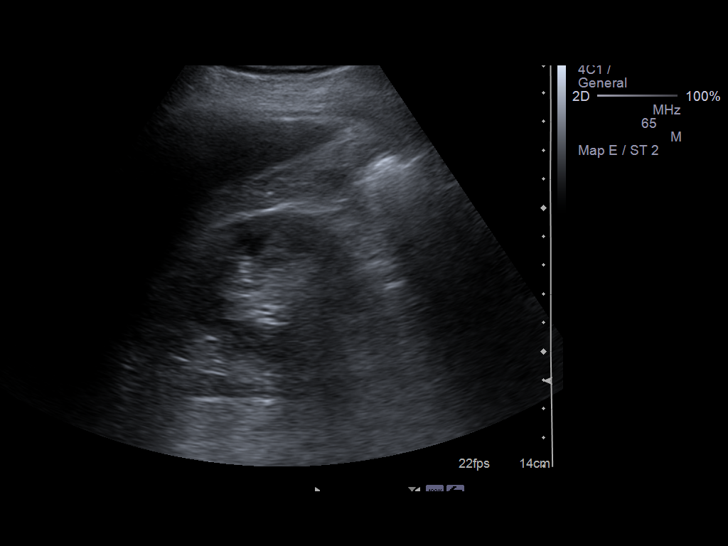
[im 11/30]
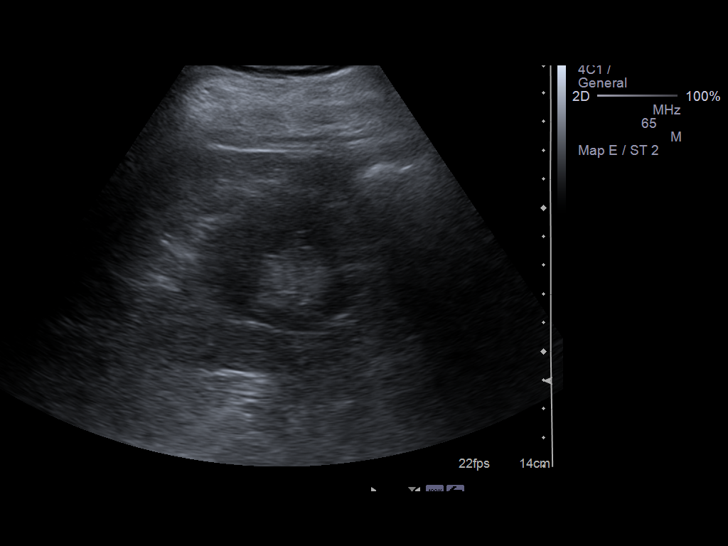
[im 14/30]
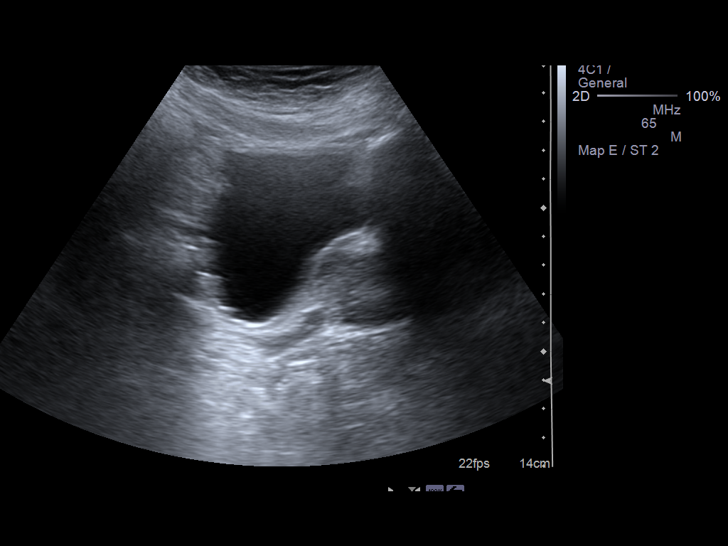
[im 16/30]
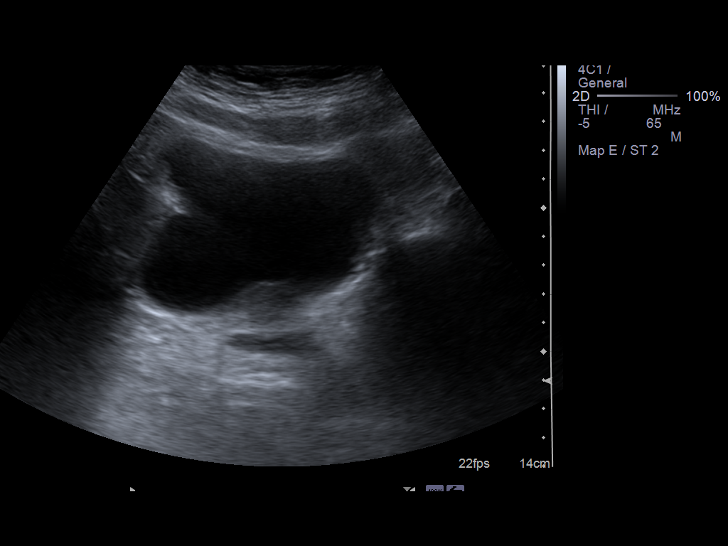
[im 19/30]
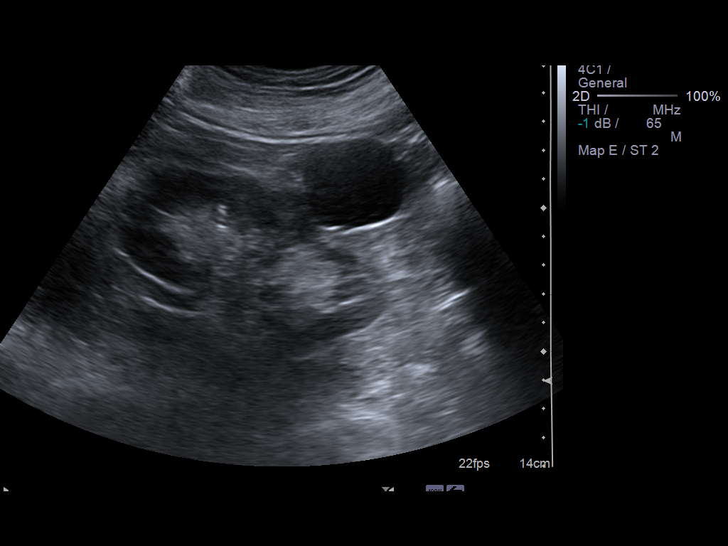
[im 20/30]
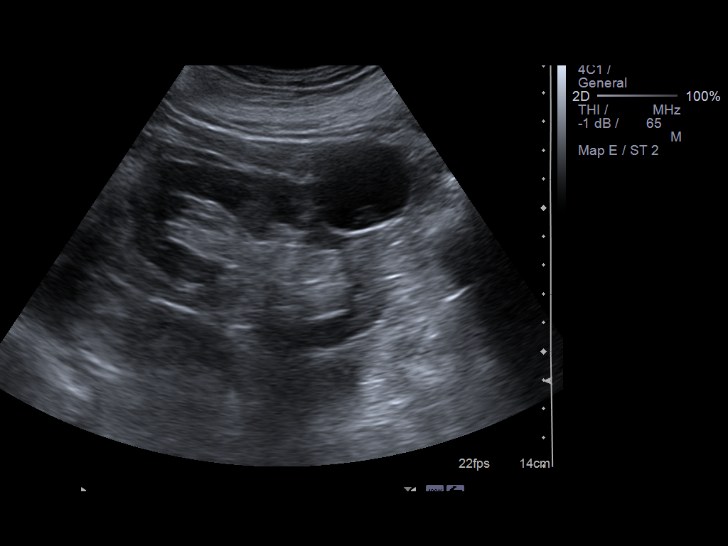
[im 22/30]
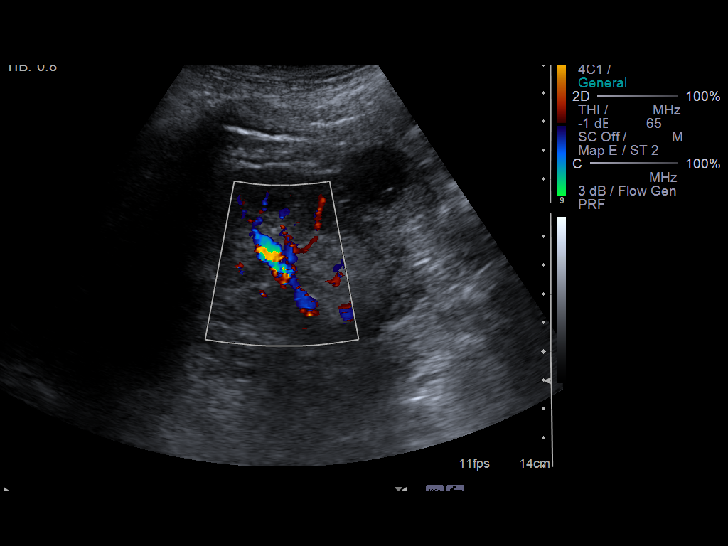
[im 25/30]
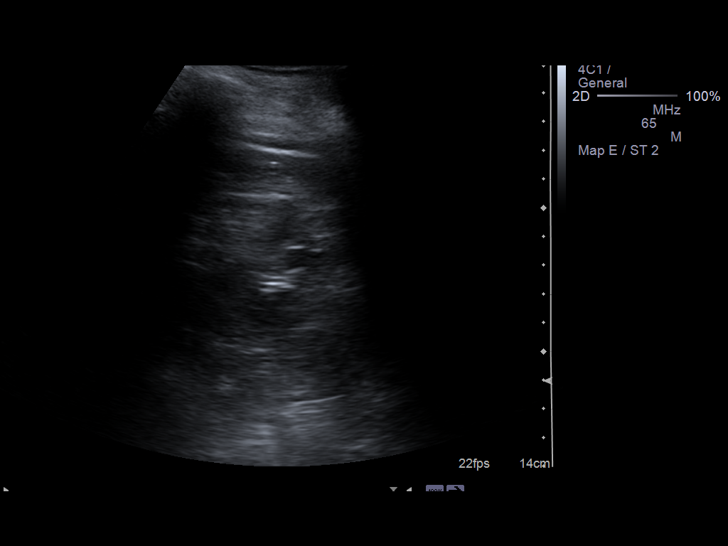
[im 27/30]
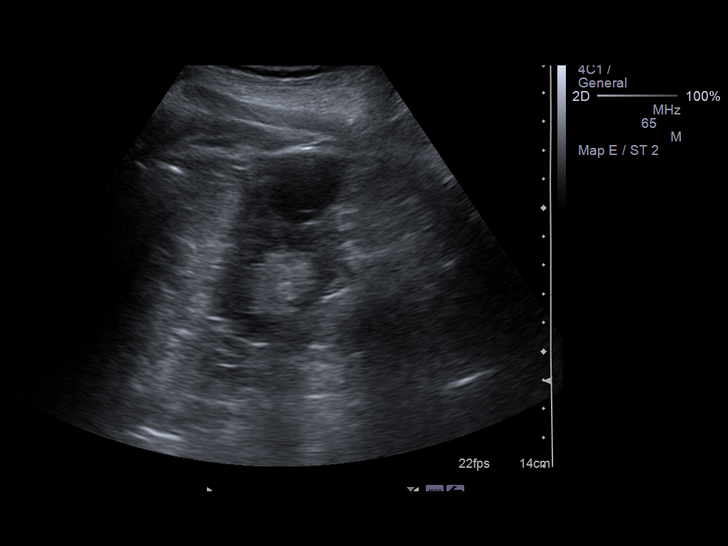
[im 30/30]
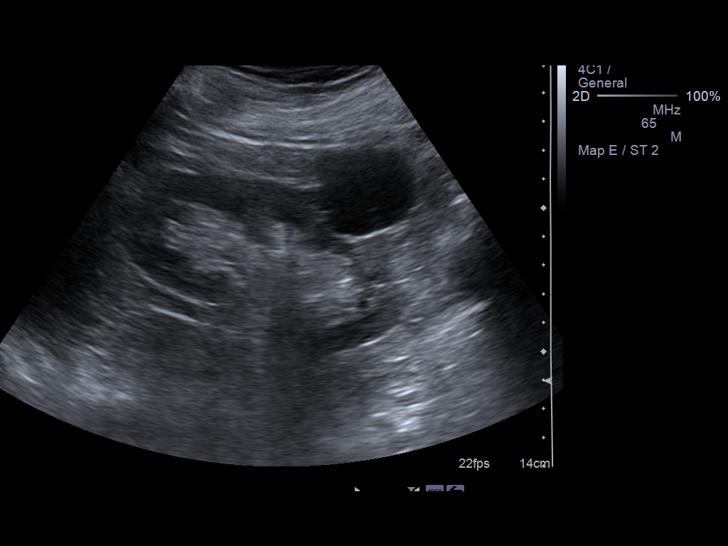

[14 of 25 positions shown; findings below may reference images not displayed]

FINDINGS: Right Kidney:  9.4 cm in length.  Echogenic.  No masses or
hydronephrosis.  No visible calculi.

Left Kidney:  9.7 cm in length.  Echogenic. No masses or
hydronephrosis.  No visible calculi.  3.8 x 3.4 x 4.0 cm cyst
projects from the lateral and lower aspect of the left kidney.

Bladder:  Bladder is normal.  Prostate is increased in size in this
patient with prostate cancer.
IMPRESSION: No evidence for obstructive uropathy.  Echogenic kidneys suggesting
medical renal disease.

## 2012-07-31 ENCOUNTER — Ambulatory Visit
Admission: RE | Admit: 2012-07-31 | Discharge: 2012-07-31 | Disposition: A | Discharge status: Home / Self Care | Payer: PRIVATE HEALTH INSURANCE | Source: Ambulatory Visit | Attending: Internal Medicine | Admitting: Internal Medicine

## 2012-07-31 ENCOUNTER — Other Ambulatory Visit: Payer: Self-pay | Admitting: Internal Medicine

## 2012-07-31 DIAGNOSIS — R51 Headache: Secondary | ICD-10-CM

## 2012-07-31 DIAGNOSIS — R42 Dizziness and giddiness: Secondary | ICD-10-CM

## 2012-09-09 IMAGING — CT CT HEAD WO/W CM
3 of 4 series · 16 of 30 positions shown, 18 images · IV contrast (omnipaque)
Comparison: Head CT 12/13/2010 and nuclear medicine bone scan
11/16/2010

CLINICAL DATA: Altered mental status and history prostate cancer.
Left side of face when none today.

CT HEAD WITHOUT AND WITH CONTRAST
TECHNIQUE: Contiguous axial images were obtained from the base of
the skull through the vertex without and with intravenous contrast.
Contrast: 75mL OMNIPAQUE IOHEXOL 300 MG/ML IV SOLN

[Series 2: head w/o · axial · non-contrast · 0.46mm/px · z∈[+160,+260]mm · 5 of 32 slices shown]
[im 6/32  brain]
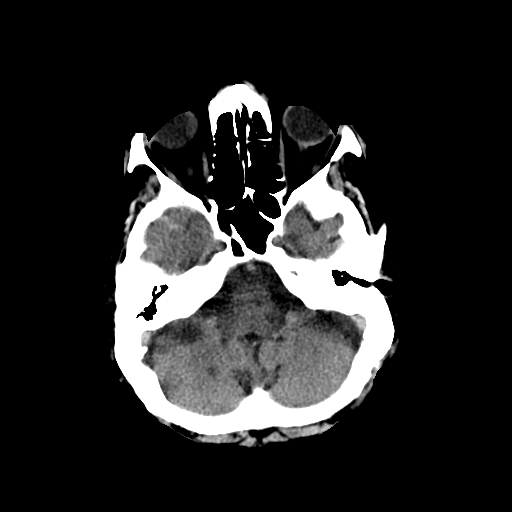
[im 11/32  brain]
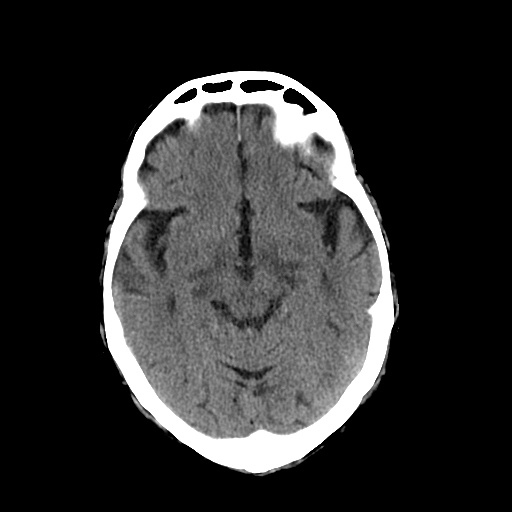
[im 16/32  brain]
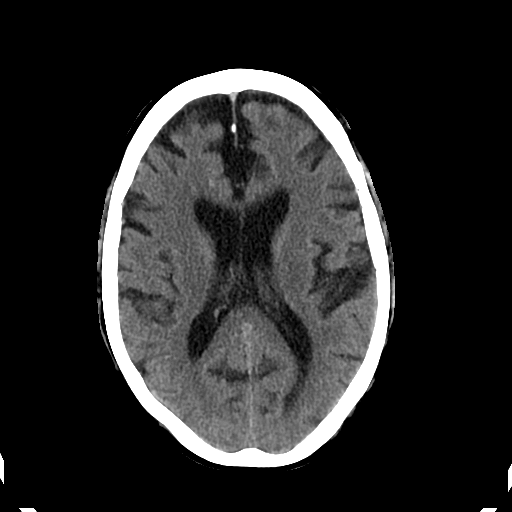
[im 21/32  brain]
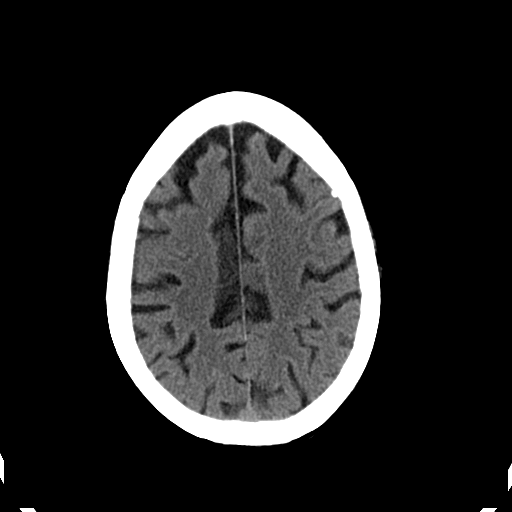
[im 26/32  brain]
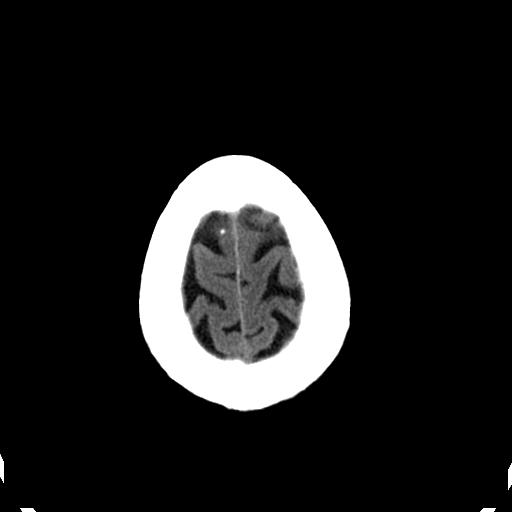

[Series 3: head w/o bone · axial · non-contrast · 0.46mm/px · z∈[+160,+260]mm · 5 of 32 slices shown]
[im 6/32  bone]
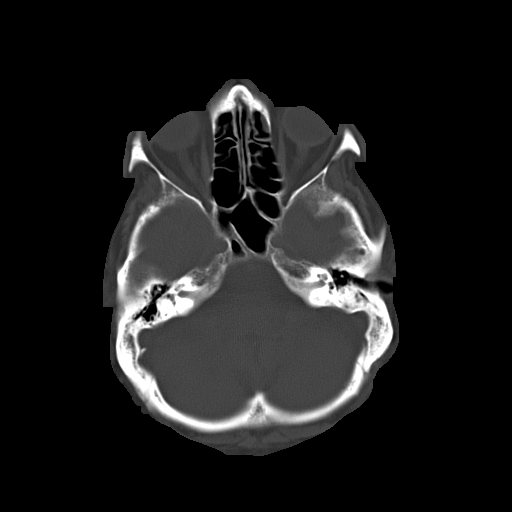
[im 11/32  bone]
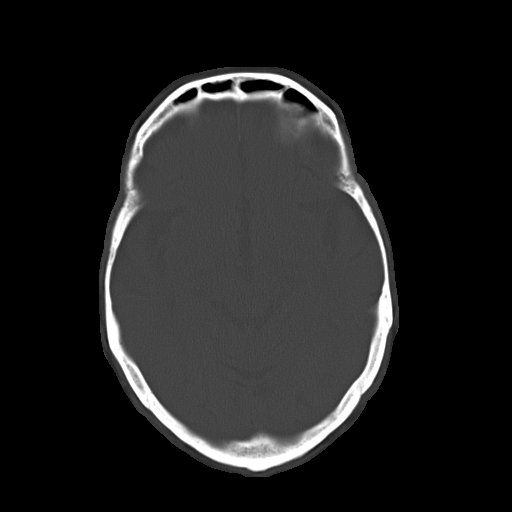
[im 16/32  bone]
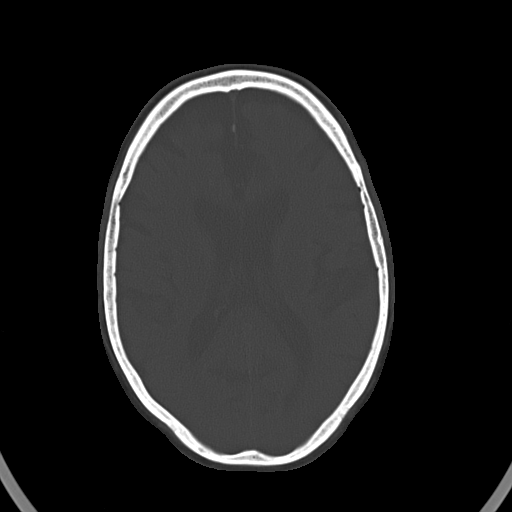
[im 21/32  bone]
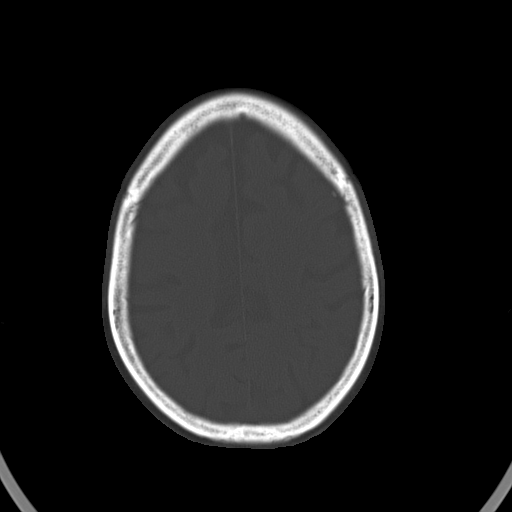
[im 26/32  bone]
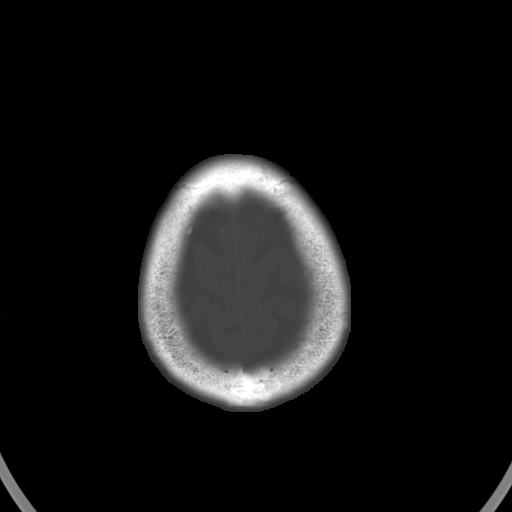

[Series 4: head w contrast · axial · 0.46mm/px · z∈[+154,+264]mm · 6 of 32 slices shown, 8 images]
[im 5/32  brain]
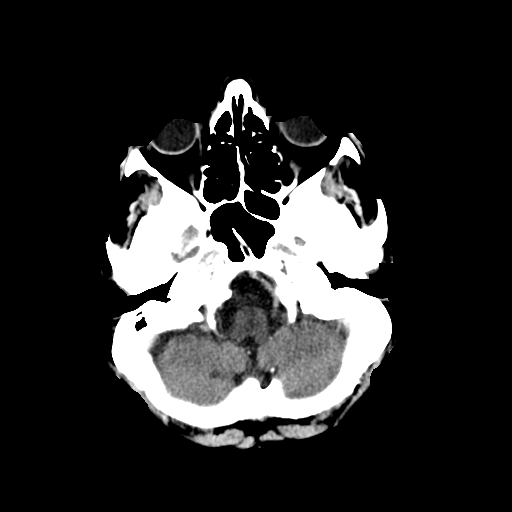
[im 5/32  bone]
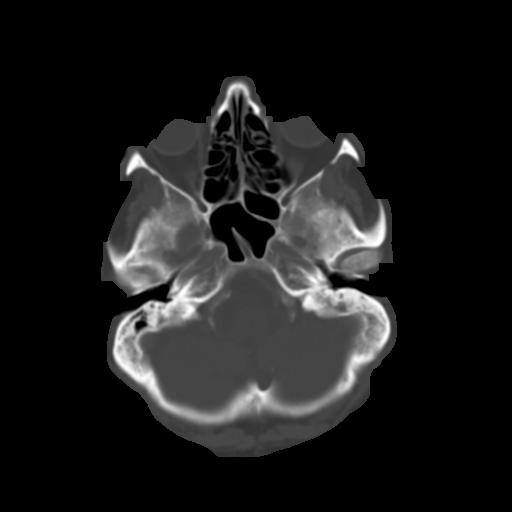
[im 9/32  brain]
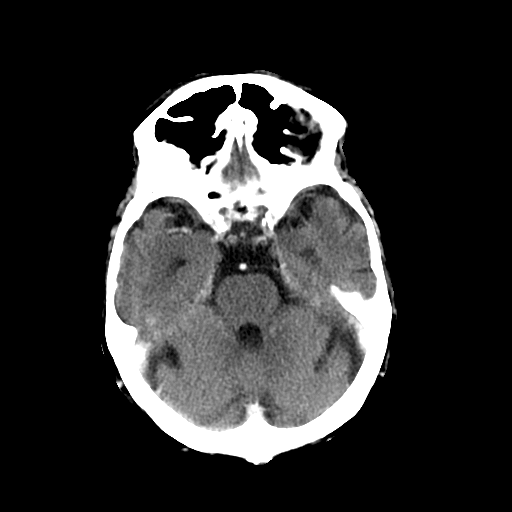
[im 14/32  brain]
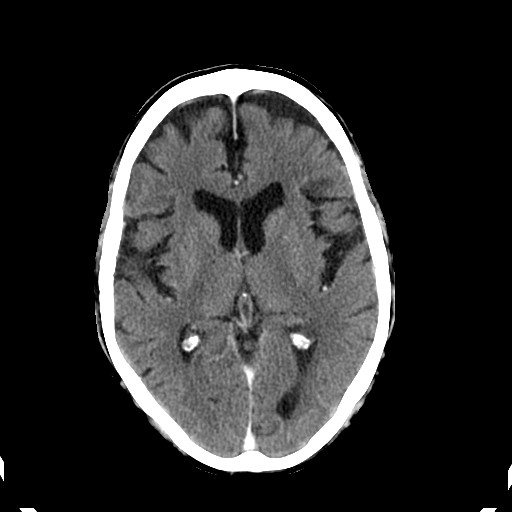
[im 18/32  brain]
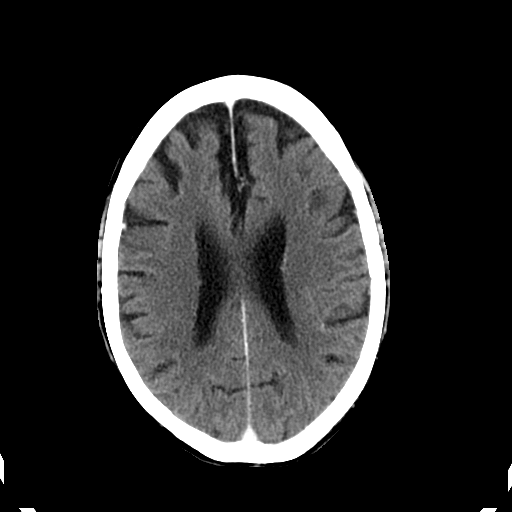
[im 23/32  brain]
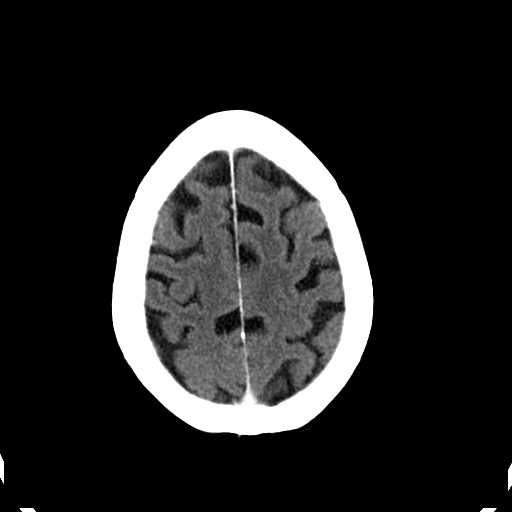
[im 23/32  bone]
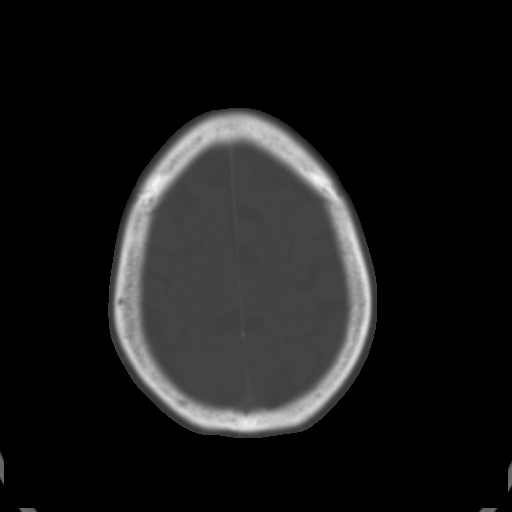
[im 27/32  brain]
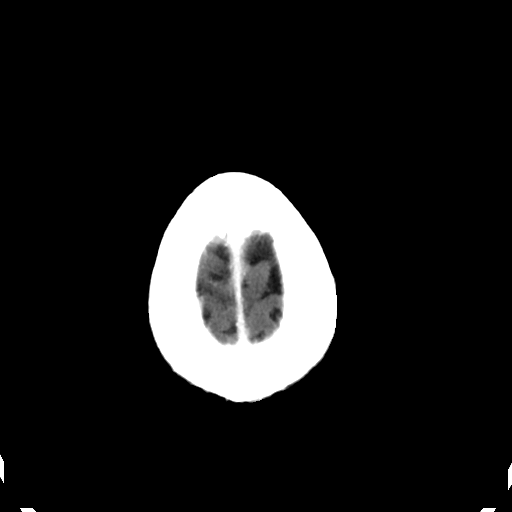

[16 of 30 positions shown; findings below may reference images not displayed]

FINDINGS: There is diffuse cortical atrophy, stable.  Ventricles
are stable and normal in size.  No evidence of acute intracranial
abnormality.  Specifically, there is no hemorrhage, mass lesion, or
evidence of acute infarction.  There is no midline shift or mass
effect.

On postcontrast images, there is no abnormal area of enhancement.

The visualized paranasal sinuses, mastoid air cells, and middle
ears are clear.  The skull is intact.
IMPRESSION: Stable head CT.  Atrophy.  No acute intracranial abnormality, mass
lesion, or abnormal enhancement.

## 2012-09-09 IMAGING — CR DG CHEST 1V PORT
1 series · 1 of 1 positions shown · non-contrast
Comparison: Chest radiograph performed 12/13/2010

CLINICAL DATA: Pacer lead check.  Irregular EKG.

PORTABLE CHEST - 1 VIEW

[AP]
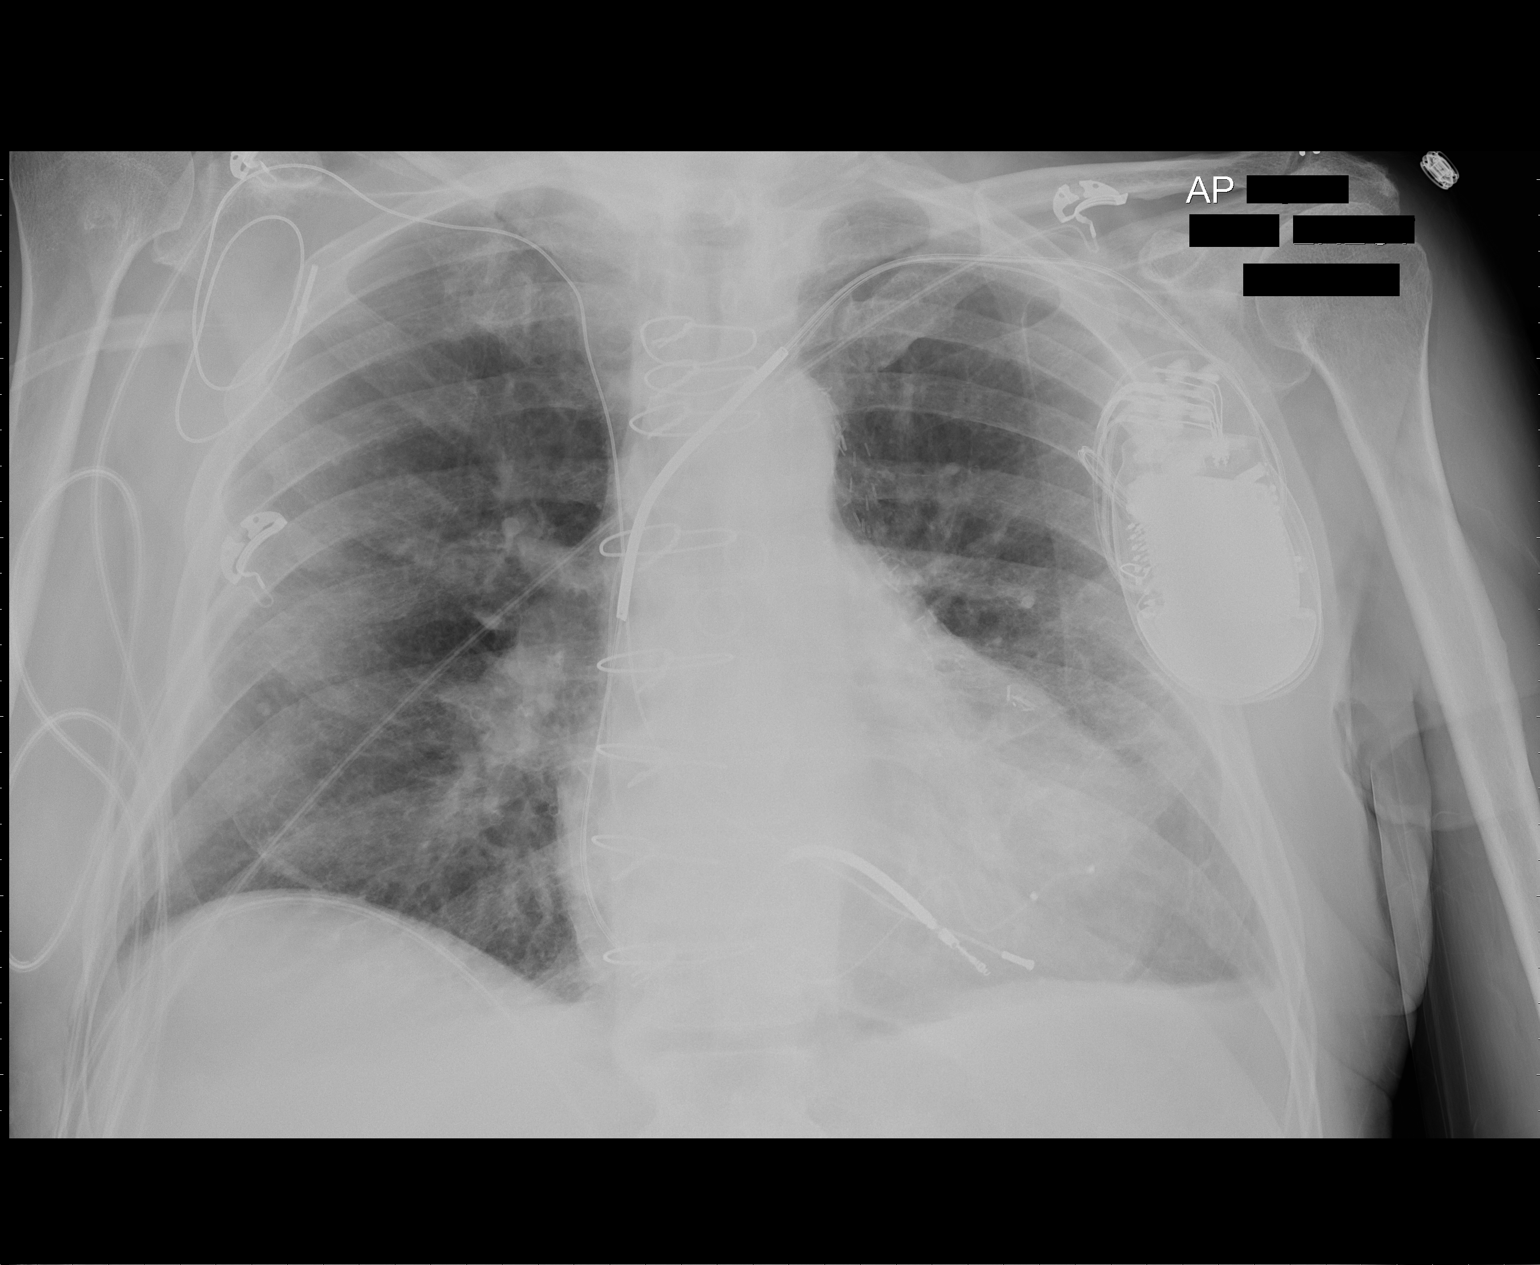

[1 of 1 positions shown; findings below may reference images not displayed]

FINDINGS: A pacemaker/AICD is noted overlying the left chest wall,
with leads ending overlying the right ventricle and coronary sinus.
The position is unchanged from the prior study.  There is also an
orphaned right-sided pacemaker lead, which also ends at the right
ventricle.

Vascular congestion is noted; mildly increased interstitial
markings are slightly more prominent than on the prior study but
are likely chronic in nature.  Blunting of the left costophrenic
angle appears stable.  No pneumothorax is seen.

The cardiomediastinal silhouette is borderline normal in size; the
patient status post median sternotomy, with evidence of prior CABG.
No acute osseous abnormalities are identified.
IMPRESSION: 1.  Pacemaker/AICD noted with leads ending overlying the right
ventricle and coronary sinus.  The position of the leads is
unchanged from the prior study.  Orphaned right-sided pacemaker
lead also ends at the right ventricle.
2.  Vascular congestion noted; slightly more prominent interstitial
markings are likely relatively chronic.  No significant edema seen.

## 2012-09-15 IMAGING — CR DG ABDOMEN 2V
2 series · 2 of 2 positions shown · non-contrast
Comparison: CT scan 02/14/2011

CLINICAL DATA: Lower abdominal pain

ABDOMEN - 2 VIEW

[w abdomen upright]
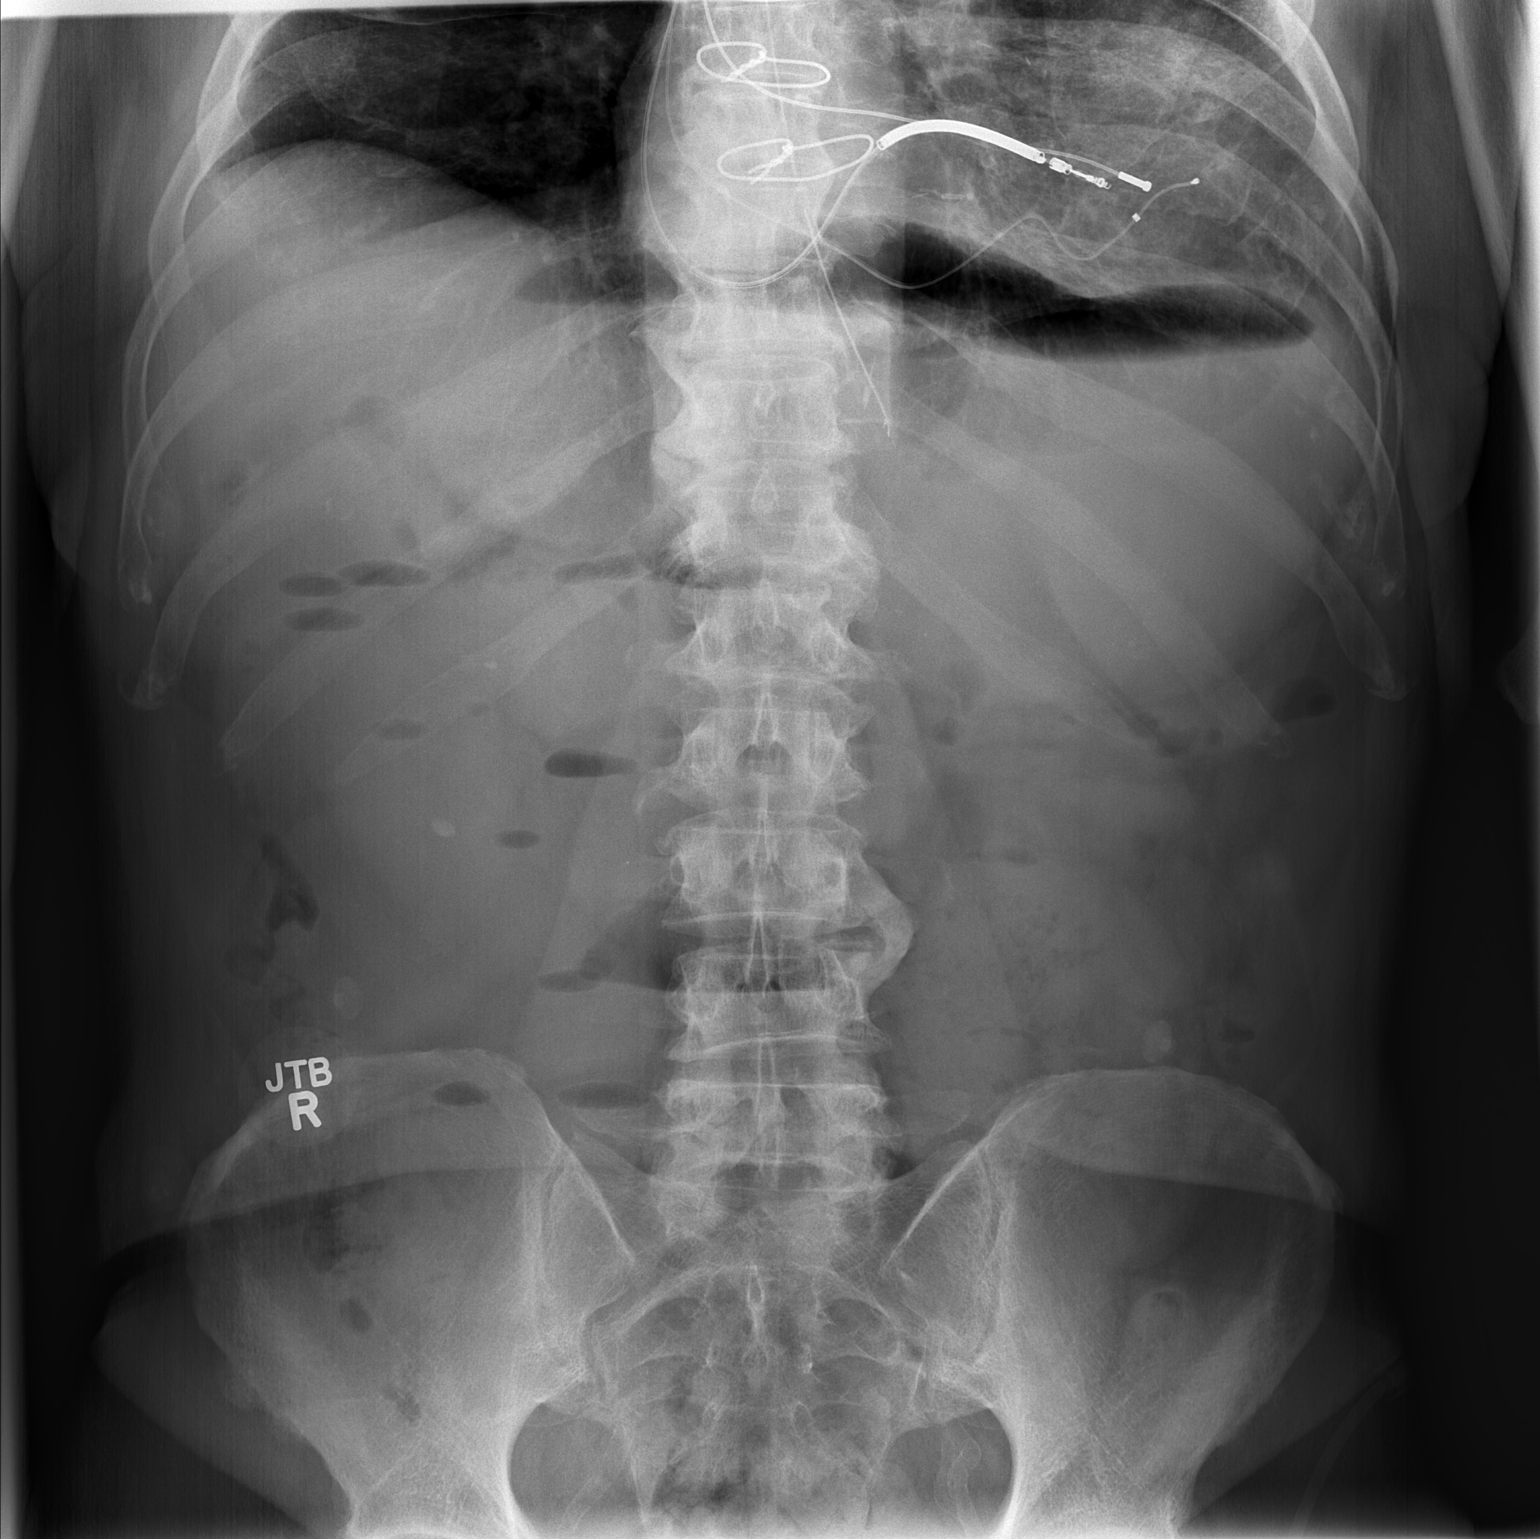

[t abdomen supine]
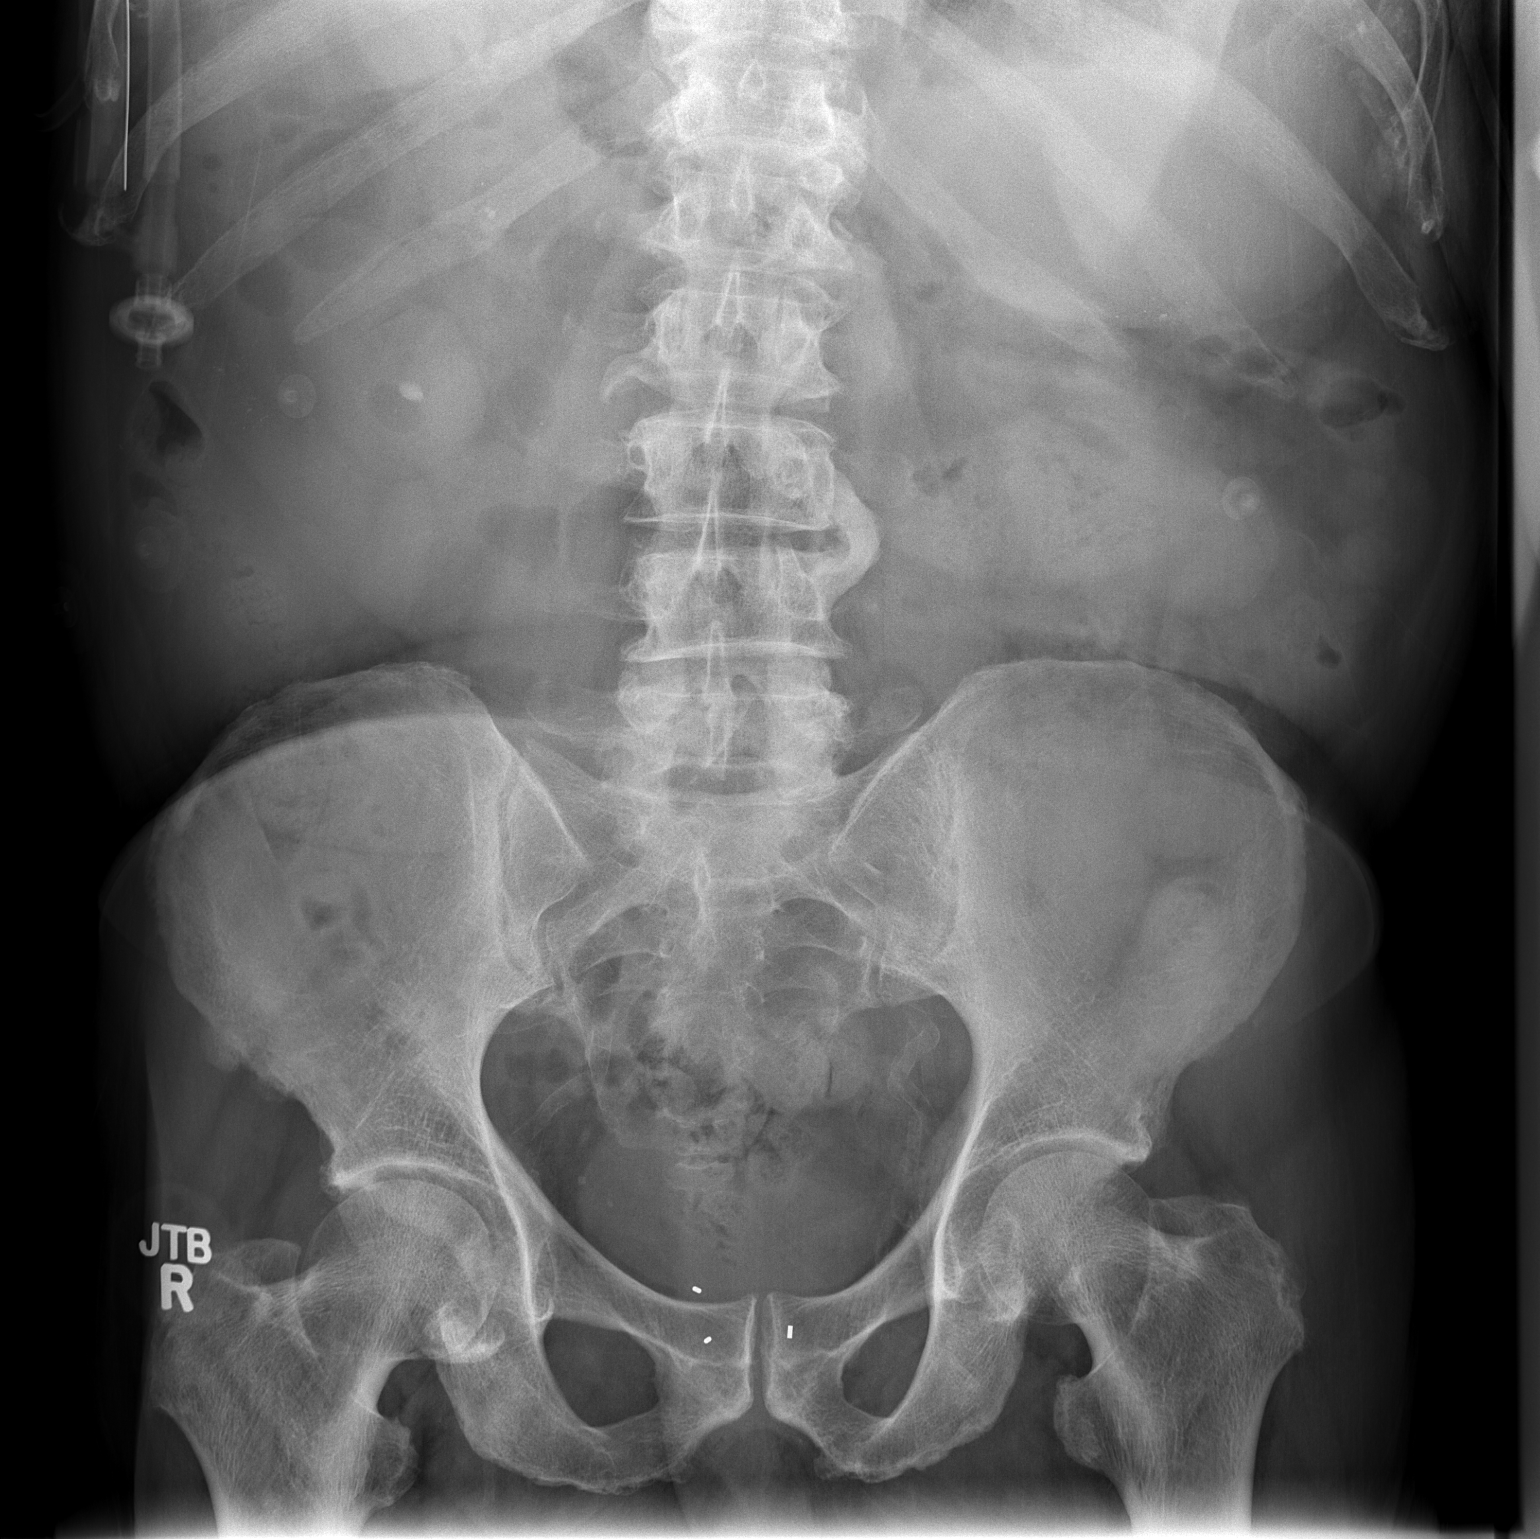

[2 of 2 positions shown; findings below may reference images not displayed]

FINDINGS: Degenerative changes of the lumbar spine are noted.  Mild
distended small bowel loops mid abdomen with multiple air-fluid
levels suspicious for bowel obstruction. Again noted calcification
lower pole region of the right kidney measures about 7 mm.
IMPRESSION: Distended small bowel loops mid abdomen with multiple air-fluid
levels suspicious for bowel obstruction.  Again noted right renal
calcification measures 7 mm.

## 2012-09-16 IMAGING — US US RENAL
1 series · 14 of 25 positions shown · non-contrast
Comparison: 02/14/2011 CT.  12/14/2010 ultrasound.

CLINICAL DATA: Cystic kidney disease.  Acute renal failure.  High
blood pressure.  Diabetic.

RENAL/URINARY TRACT ULTRASOUND COMPLETE

[Series 1: us renal · 0.30mm/px · 40 acquisitions, 14 frames shown]
[im 1/40]
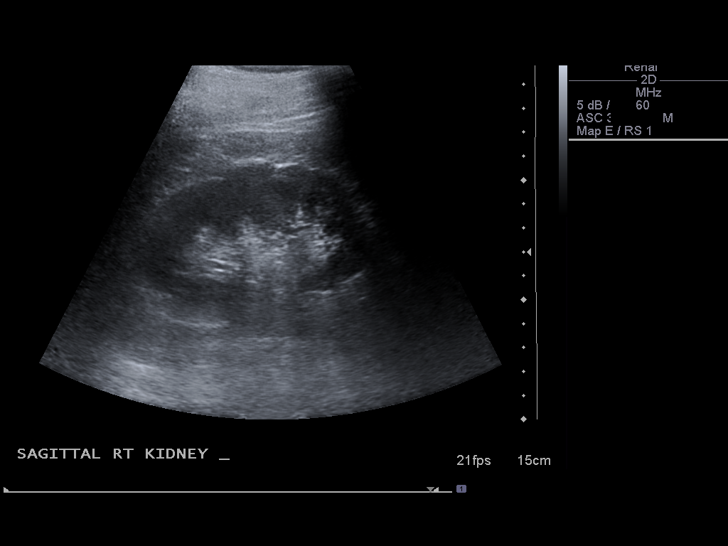
[im 4/40]
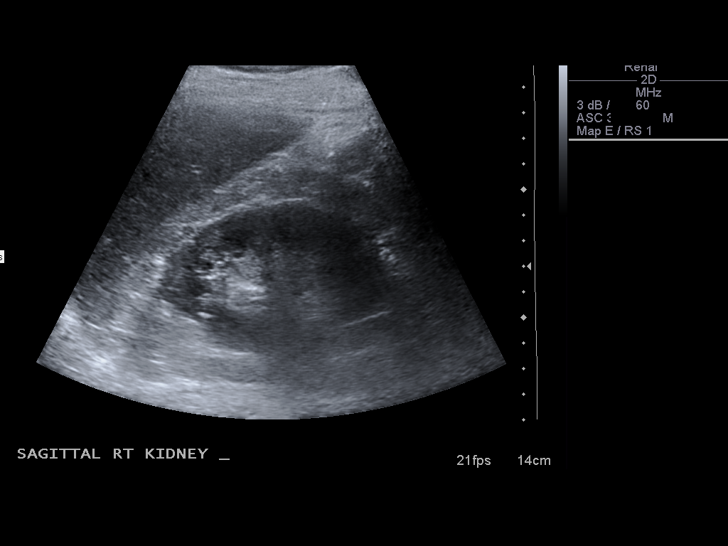
[im 7/40]
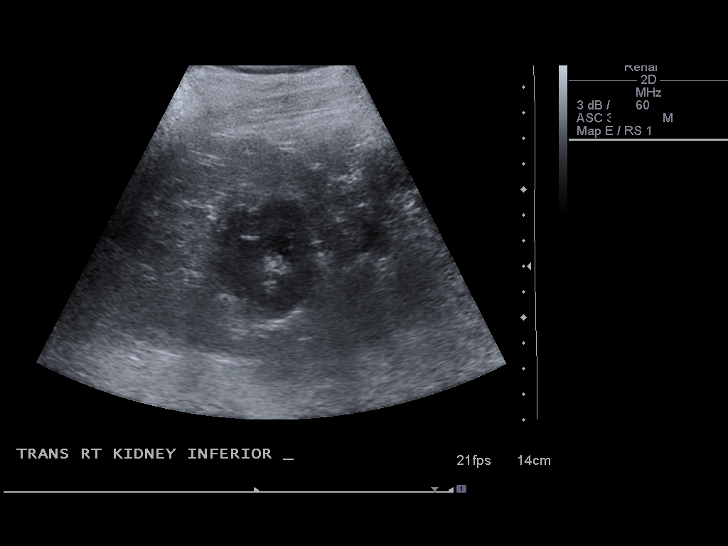
[im 10/40]
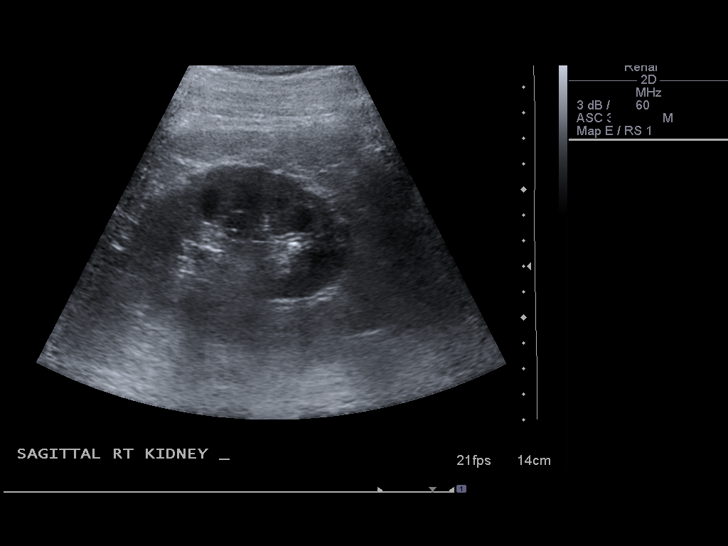
[im 14/40]
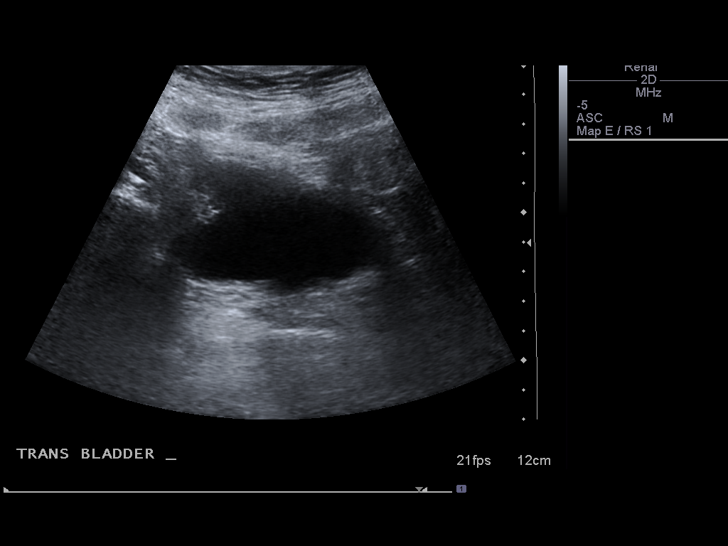
[im 15/40]
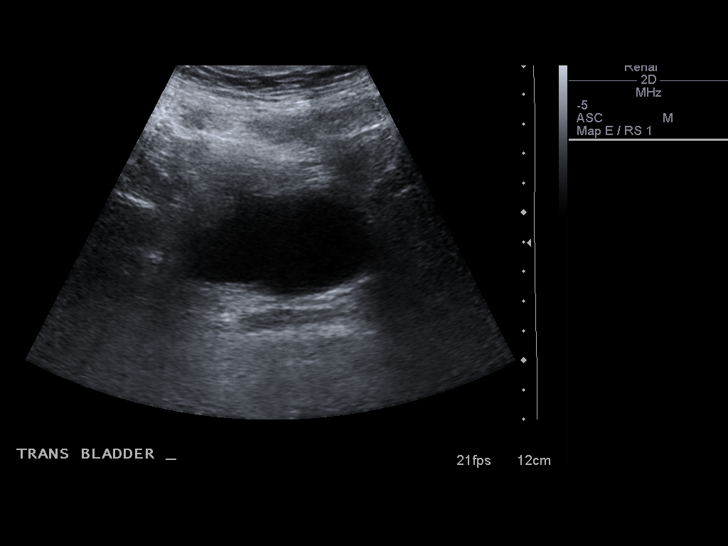
[im 18/40]
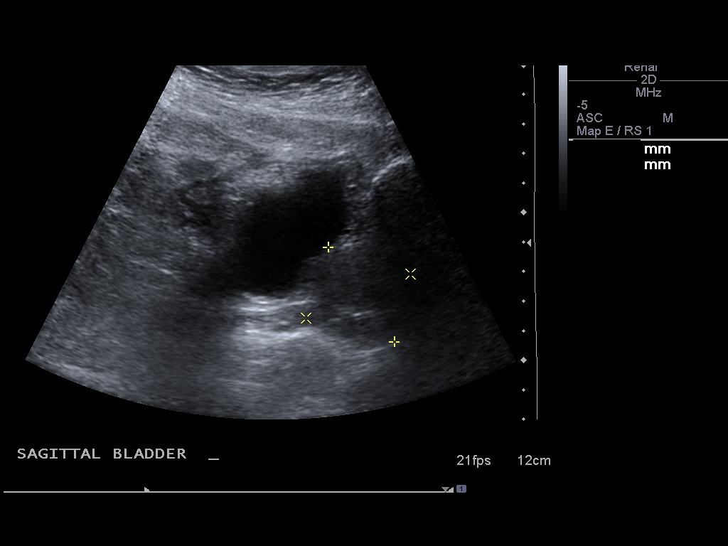
[im 22/40]
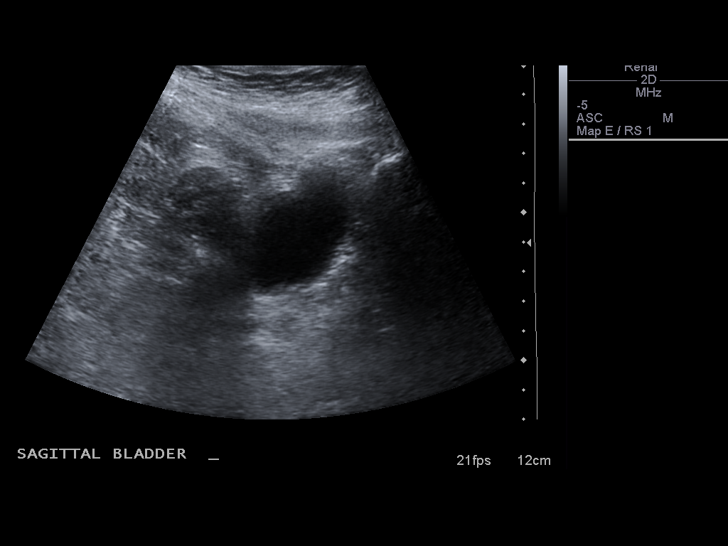
[im 25/40]
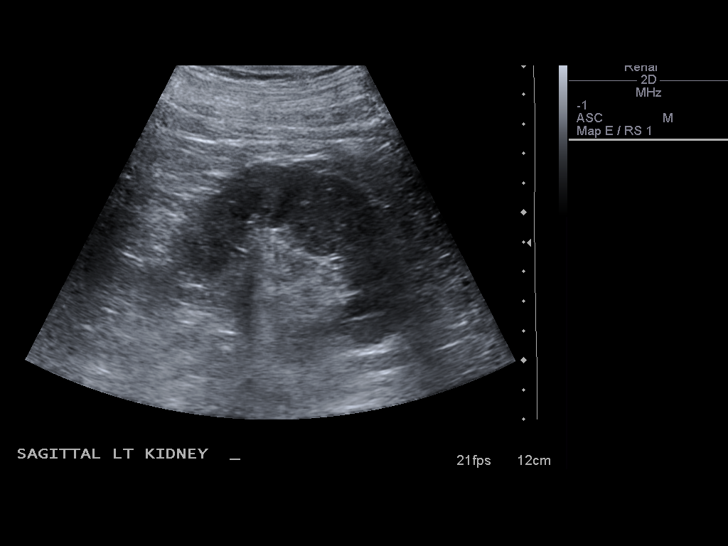
[im 27/40]
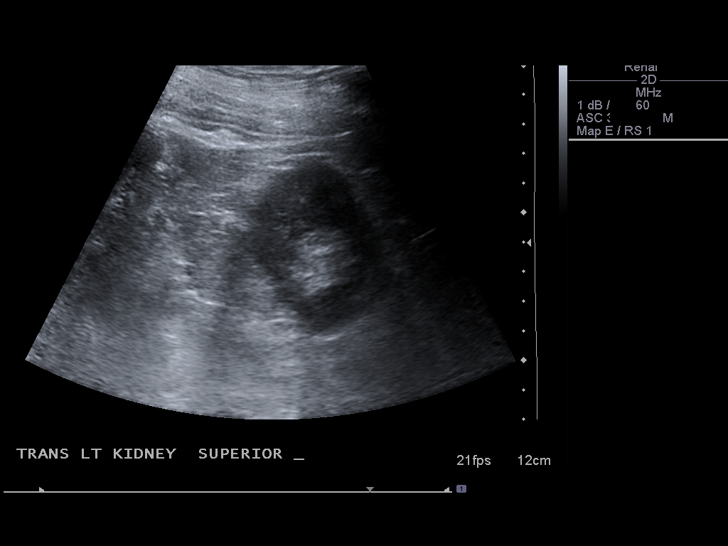
[im 30/40]
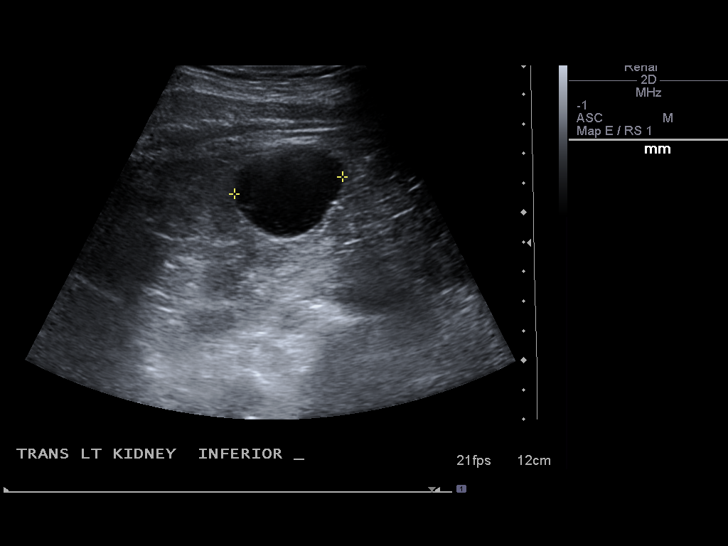
[im 33/40]
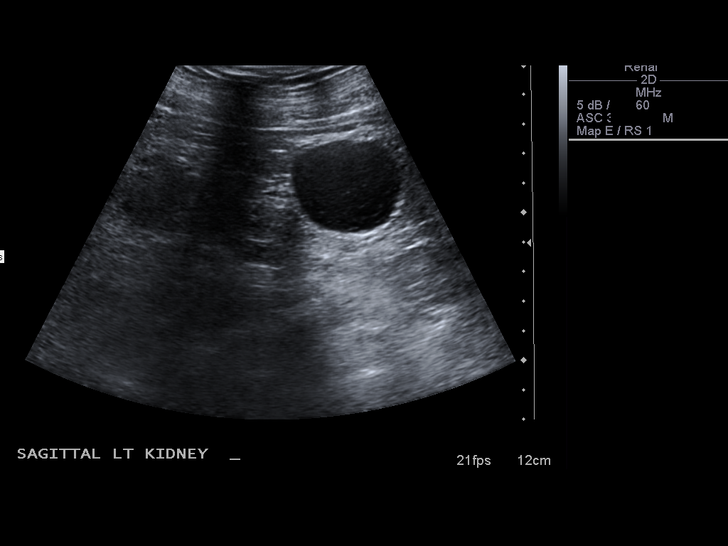
[im 36/40]
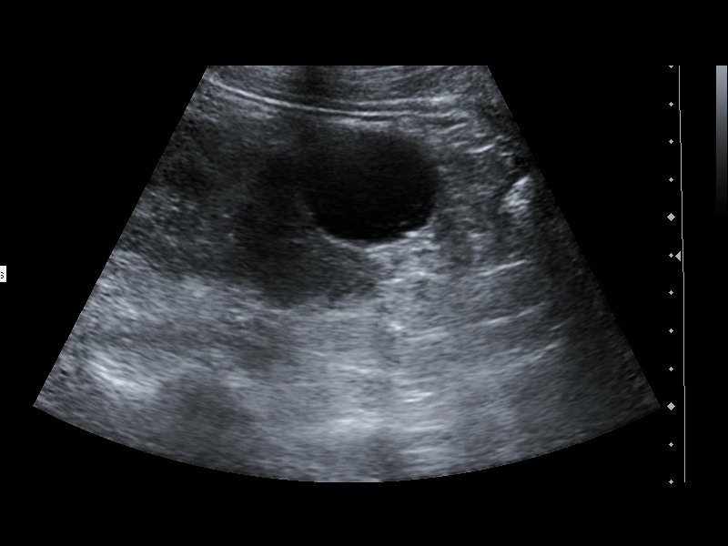
[im 40/40]
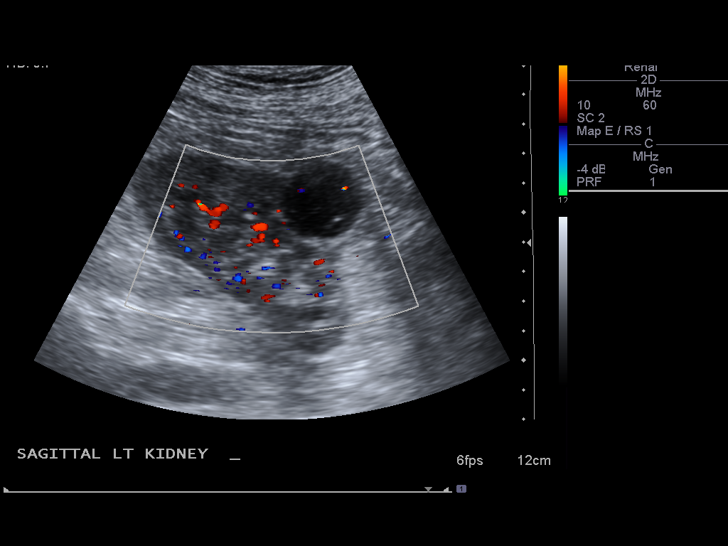

[14 of 25 positions shown; findings below may reference images not displayed]

FINDINGS: Right Kidney:  10.2 cm.  4.3 mm lower pole nonobstructing stone.
No hydronephrosis.

Left Kidney:  10.3 cm. Slightly complex left lower pole 3.9 x 3.2 x
3 cm cyst.  No hydronephrosis.

Bladder:  Decompressed without obvious mass.
IMPRESSION: 4.3 mm nonobstructing right lower pole renal calculus.

Left lower pole slightly complex 3.9 cm cystic structure.

## 2012-09-19 IMAGING — CT CT ENTEROGRAPHY (ABD-PELV W/ CM)
2 of 6 series · 17 of 46 positions shown, 19 images · IV contrast (APPLIED)
Comparison: 02/13/2009

CLINICAL DATA: Small bowel obstruction.  Blood in stool.  Question
small bowel source.

CT ABDOMEN AND PELVIS WITH CONTRAST (CT ENTEROGRAPHY)
TECHNIQUE: Multidetector CT of the abdomen and pelvis during bolus
administration of intravenous contrast. Negative oral contrast
VoLumen was given.
Contrast: 100mL OMNIPAQUE IOHEXOL 300 MG/ML IV SOLN

[Series 4: abd/pelv with 2.0 b31f st · axial · 0.79mm/px · z∈[-484,-12]mm · 14 of 264 slices shown, 16 images]
[im 14/264  soft-tissue]
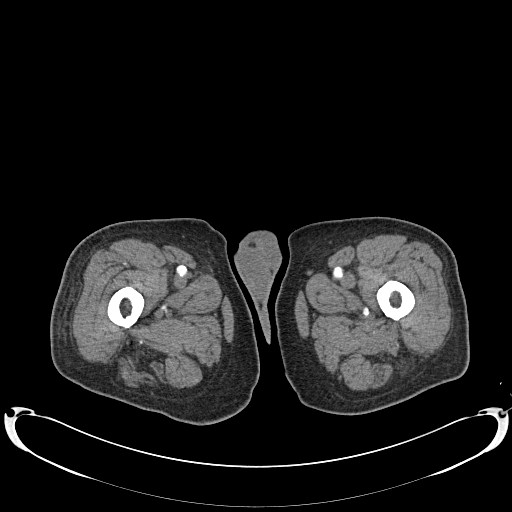
[im 14/264  bone]
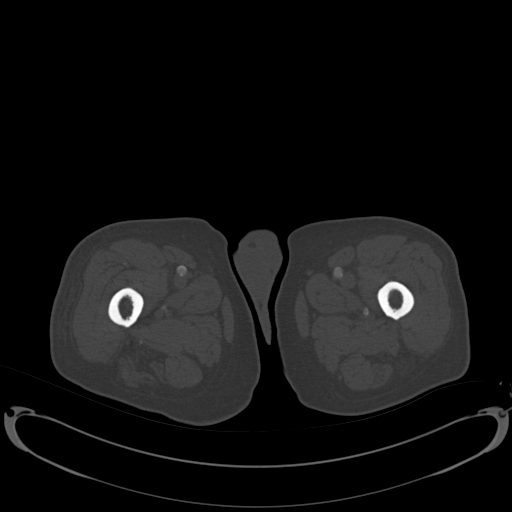
[im 28/264  soft-tissue]
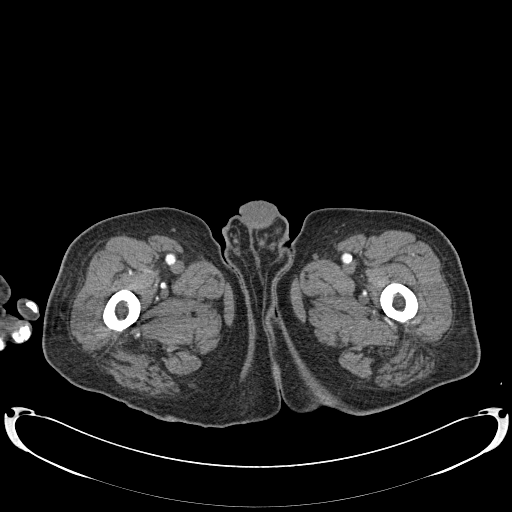
[im 56/264  soft-tissue]
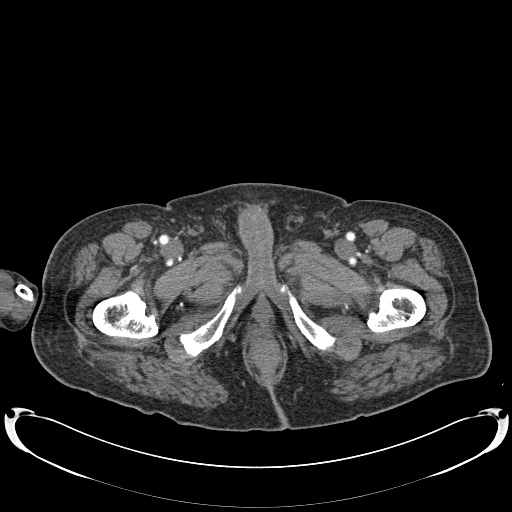
[im 70/264  soft-tissue]
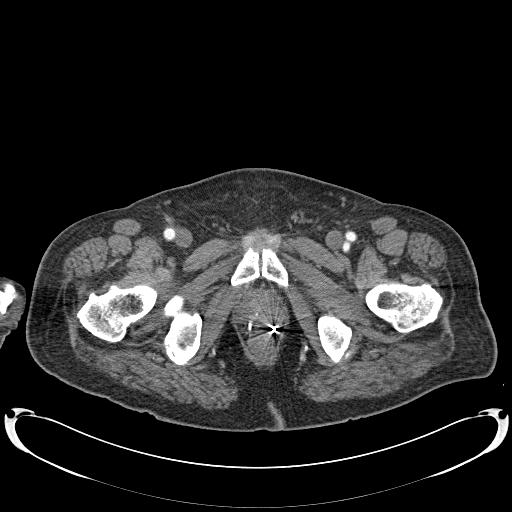
[im 84/264  soft-tissue]
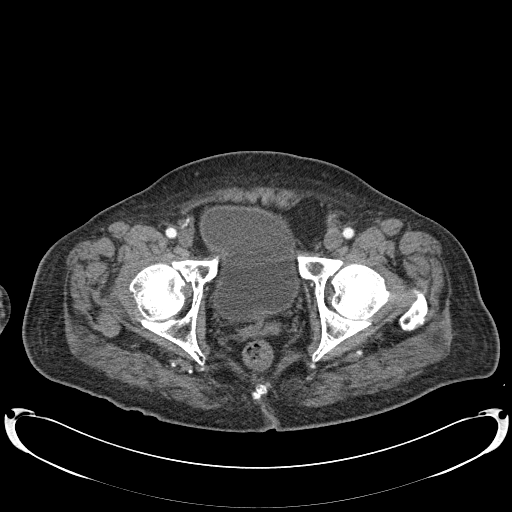
[im 111/264  soft-tissue]
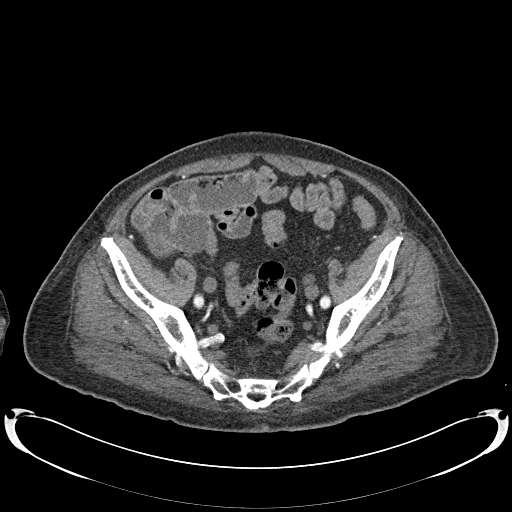
[im 125/264  soft-tissue]
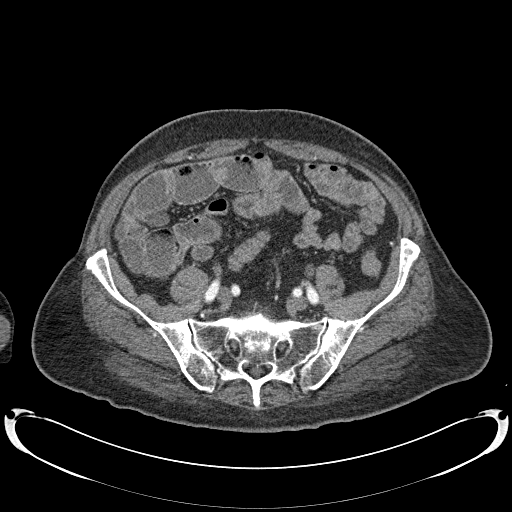
[im 139/264  soft-tissue]
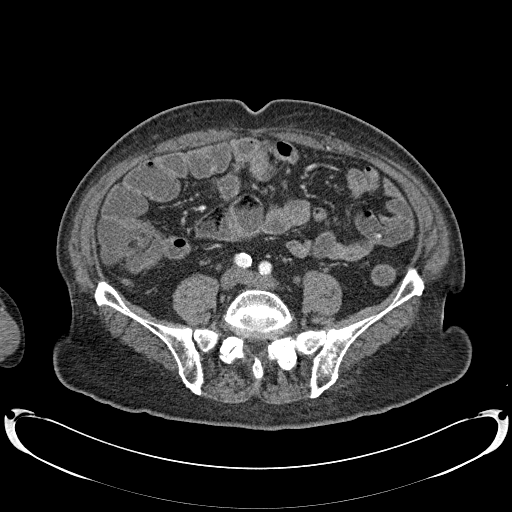
[im 153/264  soft-tissue]
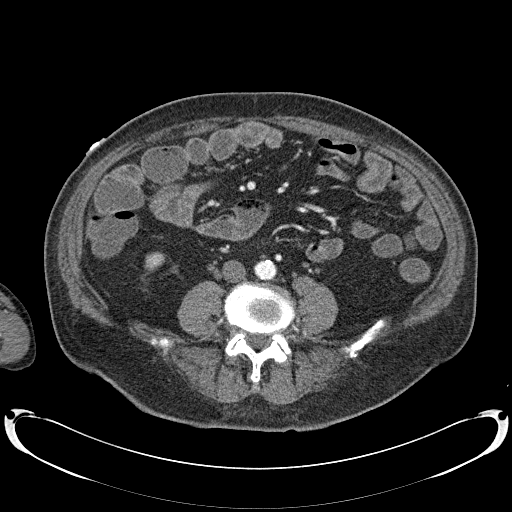
[im 153/264  bone]
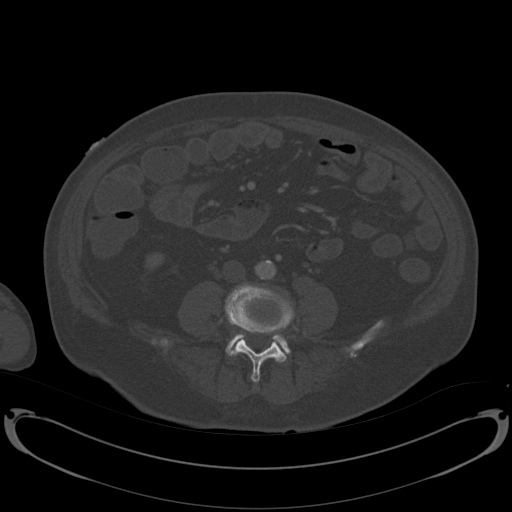
[im 180/264  soft-tissue]
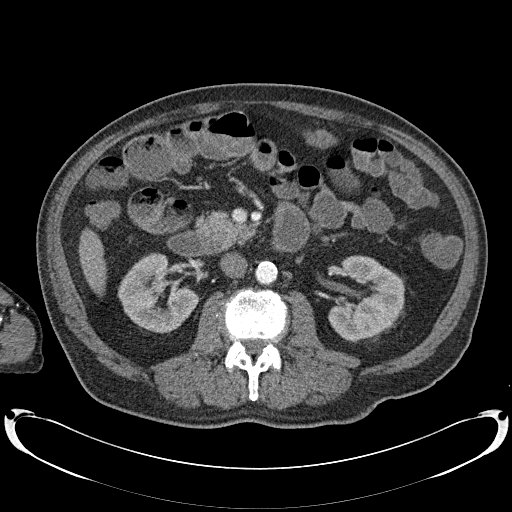
[im 194/264  soft-tissue]
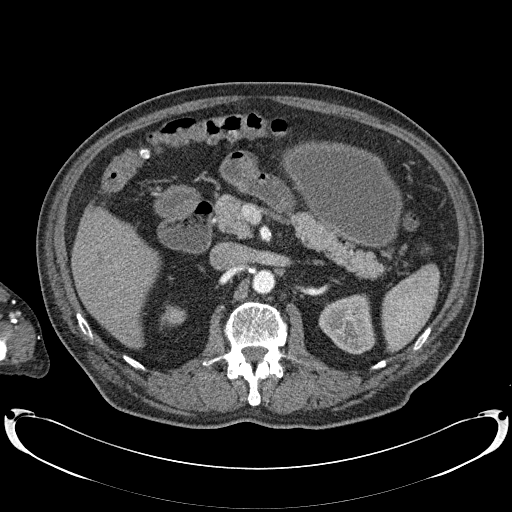
[im 208/264  soft-tissue]
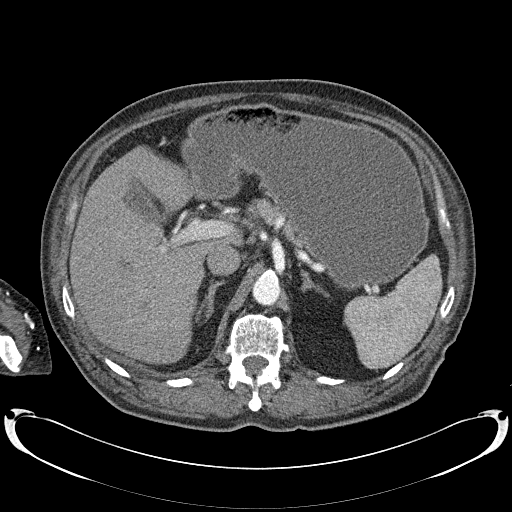
[im 236/264  soft-tissue]
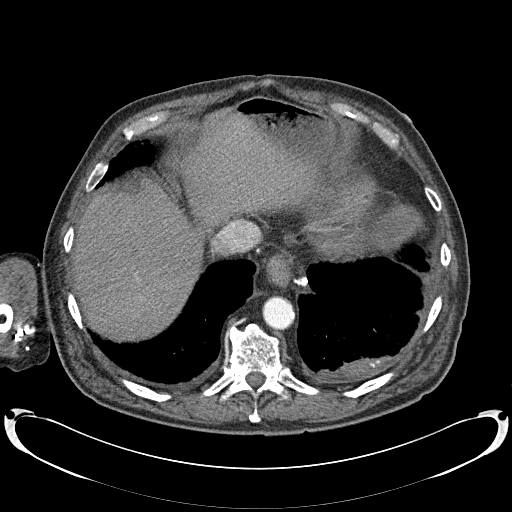
[im 250/264  soft-tissue]
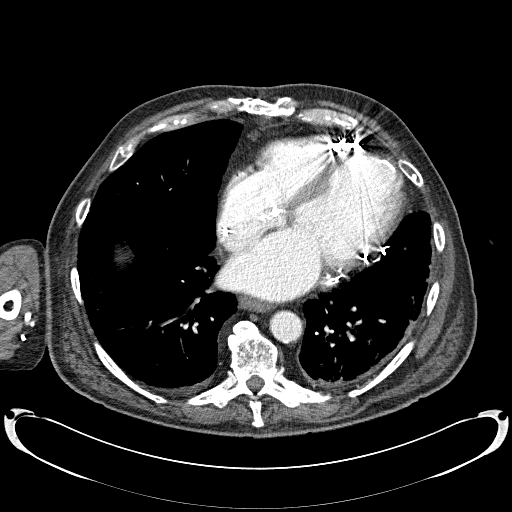

[Series 5: coronals · coronal · 1.03mm/px · 3 of 84 slices shown]
[im 28/84  soft-tissue]
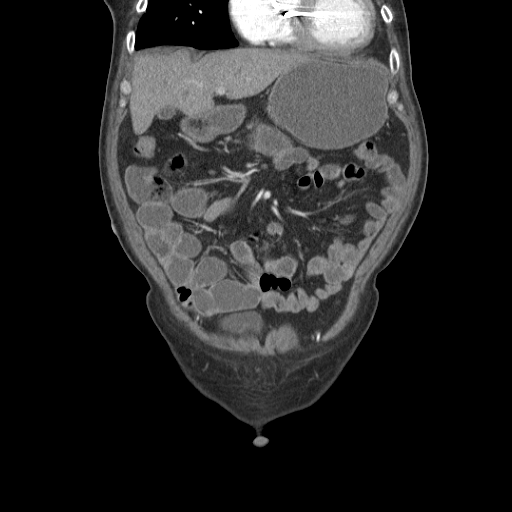
[im 37/84  soft-tissue]
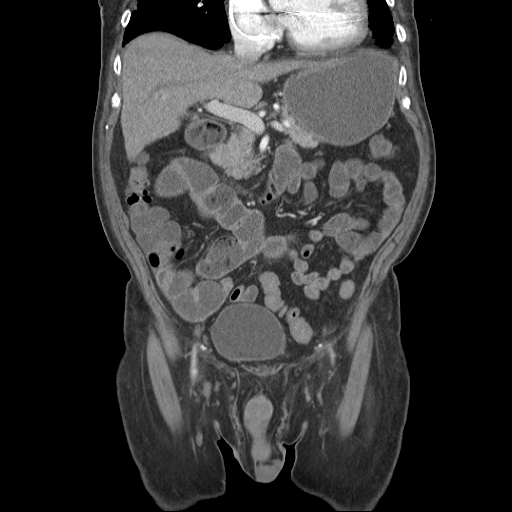
[im 47/84  soft-tissue]
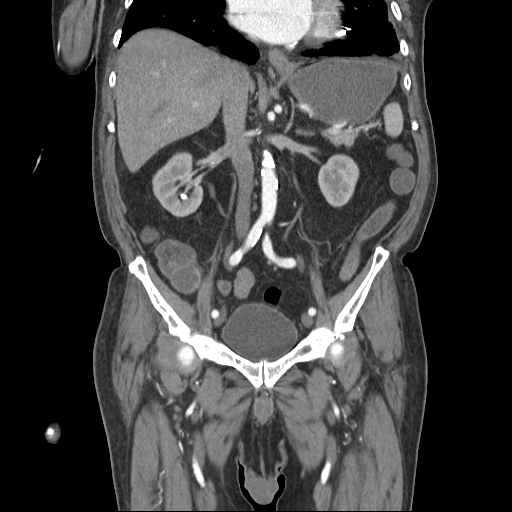

[17 of 46 positions shown; findings below may reference images not displayed]

FINDINGS: The stomach is well distended.  No abnormal mucosal or
mural enhancement in the stomach.  The duodenum is normal.  There
is no abnormal enhancement and small bowel.  No small bowel
obstruction.  No evidence for small bowel stricture.  No small
bowel mass lesion is evident.  Terminal ileum is normal.
Diverticular changes seen in the left colon without diverticulitis.
The colon is otherwise unremarkable.  The appendix is normal.

Liver and spleen have normal imaging features.  The pancreas
gallbladder and adrenal glands are unremarkable.  7 ml
nonobstructing stone is seen in the lower pole of the right kidney.
3.8 cm cystic lesion in the left kidney measures slightly higher in
attenuation would be expected for a simple cyst, but is unchanged
in size comparing back to 02/27/2009.

No abdominal aortic aneurysm.  No evidence for lymphadenopathy in
the abdomen or pelvis.

Bone windows reveal no worrisome lytic or sclerotic osseous
lesions.
IMPRESSION: No small bowel abnormality to explain the patient's history of
rectal bleeding.

The small bowel obstructive pattern seen on the previous CT scan
from 5 days ago has resolved in the interval.  There is some mild
distention of right-sided small bowel loops on today's study, but
this is not outside of the spectrum for c t enterography.

## 2012-09-29 ENCOUNTER — Encounter: Payer: PRIVATE HEALTH INSURANCE | Admitting: *Deleted

## 2012-09-30 ENCOUNTER — Encounter: Payer: Self-pay | Admitting: *Deleted

## 2012-10-22 IMAGING — CT CT ANGIO CHEST
2 of 8 series · 13 of 36 positions shown · IV contrast (CONTRAST)
Comparison: CT angio chest 08/29/2010

CLINICAL DATA: Syncope.  History of prostate cancer, lung cancer,
COPD.  The patient is on 2 liters of oxygen.  Congestive heart
failure.  Atrial fibrillation.  The patient is on Coumadin,
digoxin.  Multiple syncopal episodes.  Recent near syncope.

CT ANGIOGRAPHY CHEST
TECHNIQUE: Multidetector CT imaging of the chest using the
standard protocol during bolus administration of intravenous
contrast. Multiplanar reconstructed images including MIPs were
obtained and reviewed to evaluate the vascular anatomy.
Contrast: 50mL OMNIPAQUE IOHEXOL 350 MG/ML IV SOLN

[Series 5: pe thins · axial · 0.68mm/px · z∈[-305,-62]mm · 12 of 289 slices shown]
[im 23/289  lung]
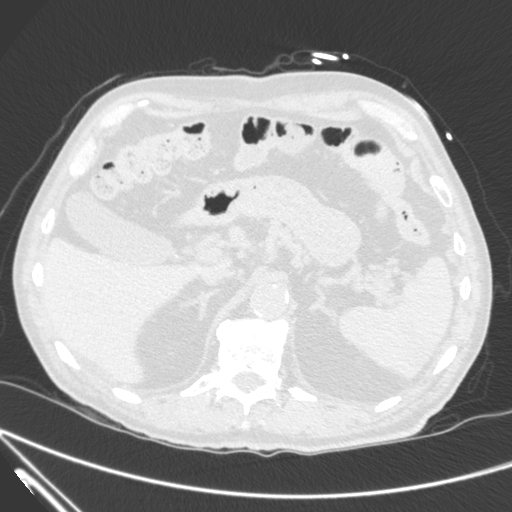
[im 45/289  mediastinal]
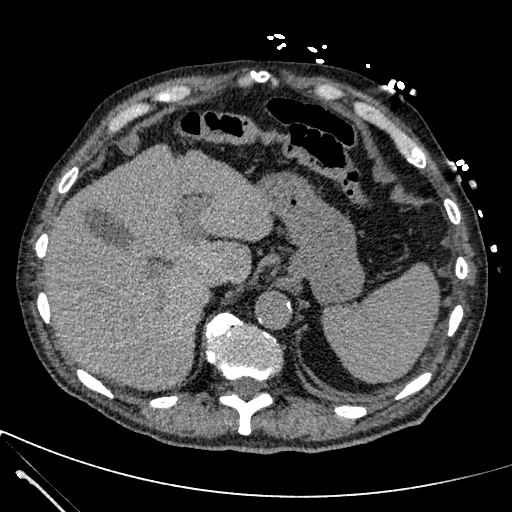
[im 67/289  lung]
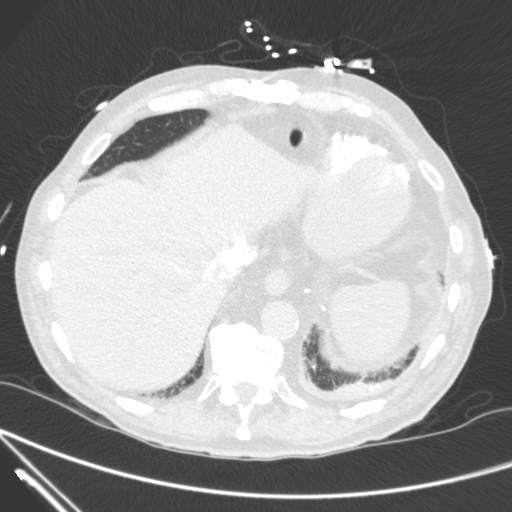
[im 89/289  mediastinal]
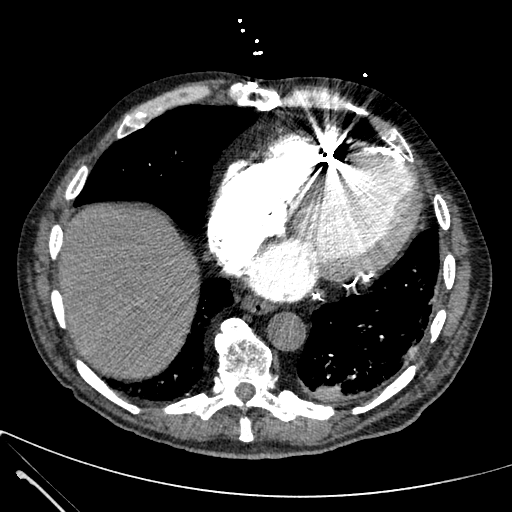
[im 111/289  lung]
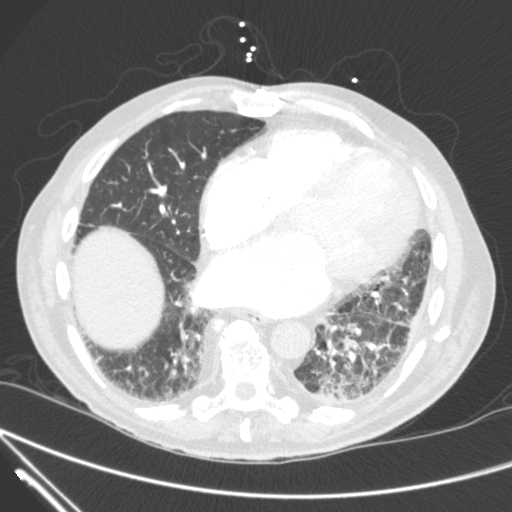
[im 133/289  mediastinal]
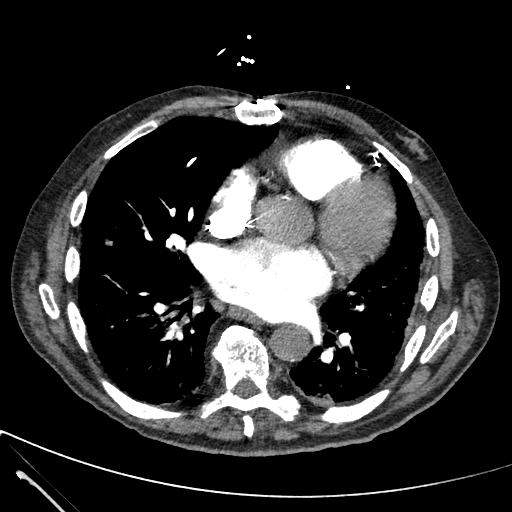
[im 156/289  lung]
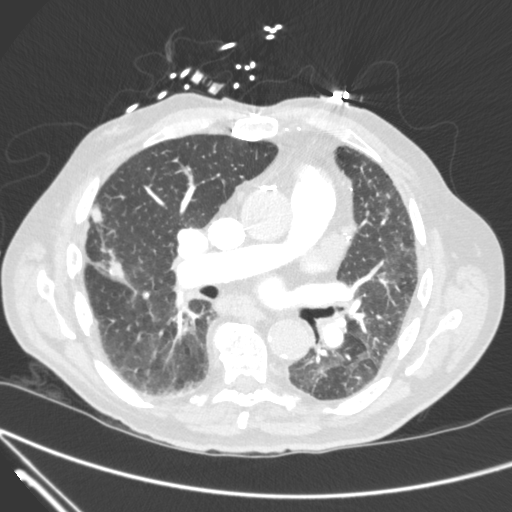
[im 178/289  mediastinal]
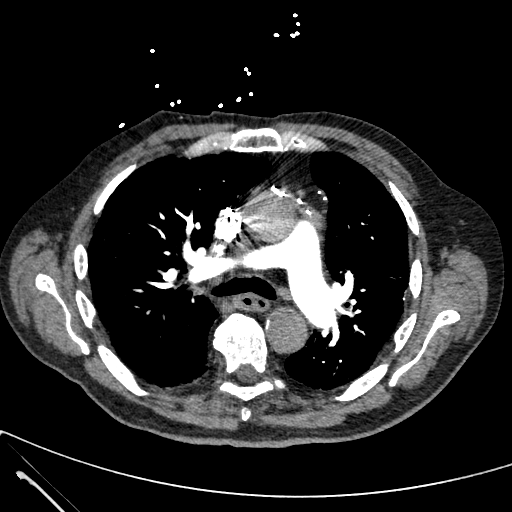
[im 200/289  lung]
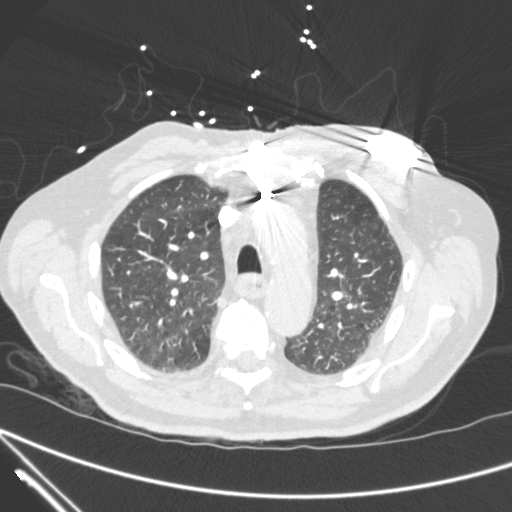
[im 222/289  mediastinal]
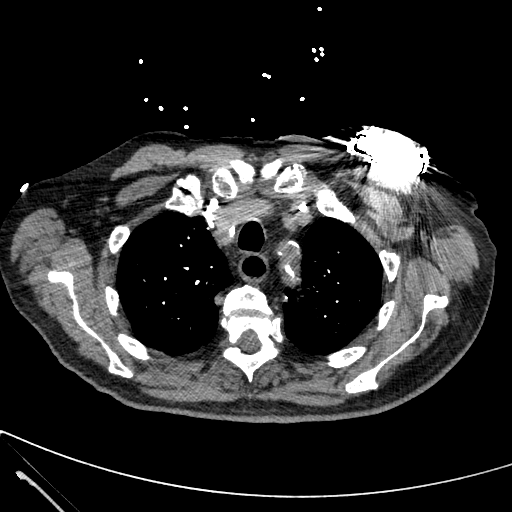
[im 244/289  lung]
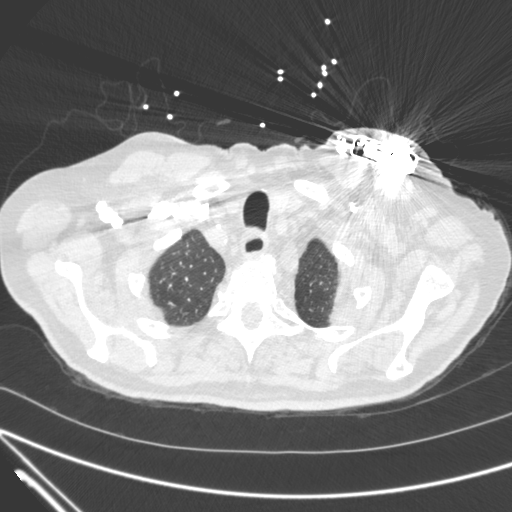
[im 266/289  mediastinal]
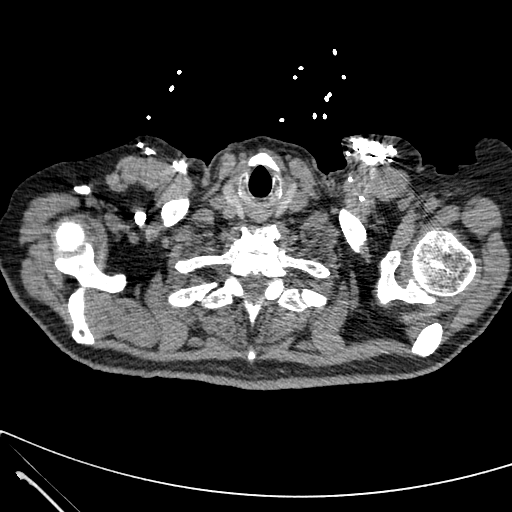

[mpr, coronals, coronal · coronal · 0.68mm/px · 1 of 119 slices shown]
[im 60/119  mediastinal]
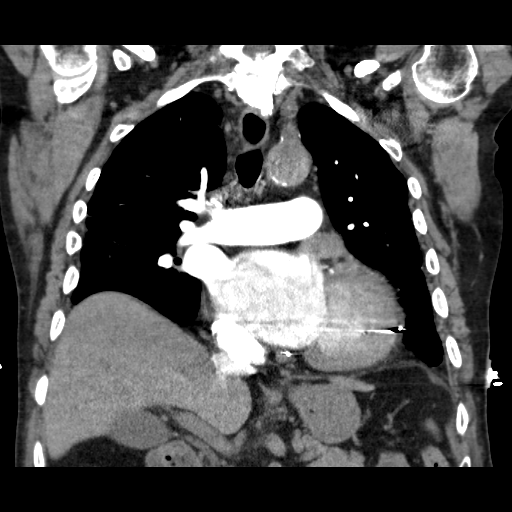

[13 of 36 positions shown; findings below may reference images not displayed]

FINDINGS: The pulmonary arteries are well opacified.  There is no
evidence for acute pulmonary embolus.

Heart is enlarged. Again noted is calcification along the left
ventricular apex, stable in appearance. The patient has had median
sternotomy with CABG.  Pacemaker is in place with leads to the
coronary sinus and right ventricle.

There are mild patchy parenchymal changes most consistent with mild
into interstitial edema.  Parenchymal changes in the right midlung
zone appears stable and likely related to asbestosis.  Calcified
pleural plaques are identified.  There are slightly prominent
anterior mediastinal lymph nodes, stable in appearance.

Images of the upper abdomen show colonic diverticula.  There are
calcified diaphragmatic nodes.
IMPRESSION: 1.  Technically adequate exam showing no evidence for acute
pulmonary embolus.
2.  Cardiomegaly with postoperative changes and stable left
ventricular calcification.
3.  Stable changes of asbestosis.
4.  Stable changes within the right midlung zone.
5.  Probable mild pulmonary edema.

## 2012-10-22 IMAGING — CR DG CHEST 2V
2 series · 2 of 2 positions shown · non-contrast
Comparison: Plain films of the chest 02/13/2011 and CT chest
08/29/2010.

CLINICAL DATA: Dizziness.  Right hip and leg pain.

CHEST - 2 VIEW

[w chest lat]
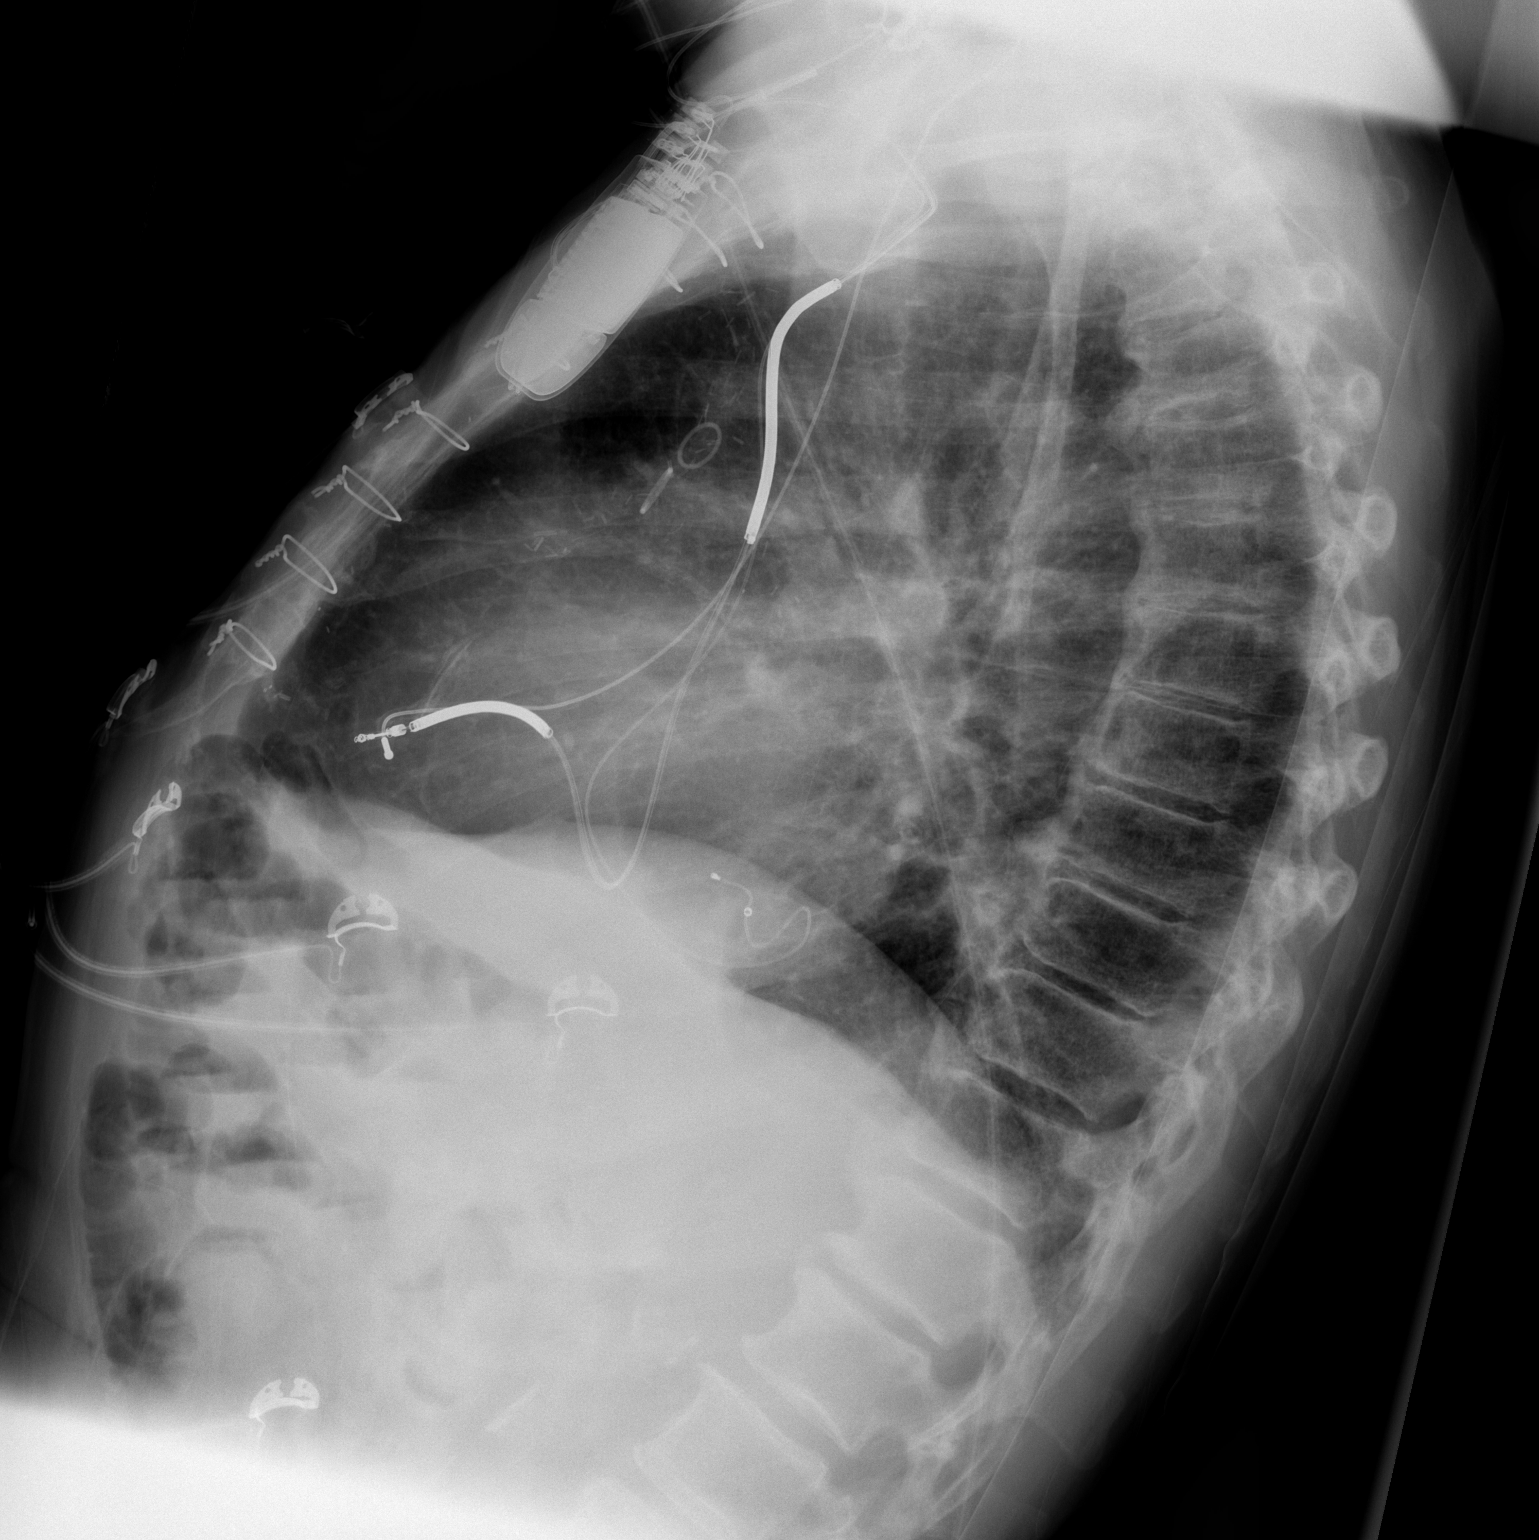

[w chest pa]
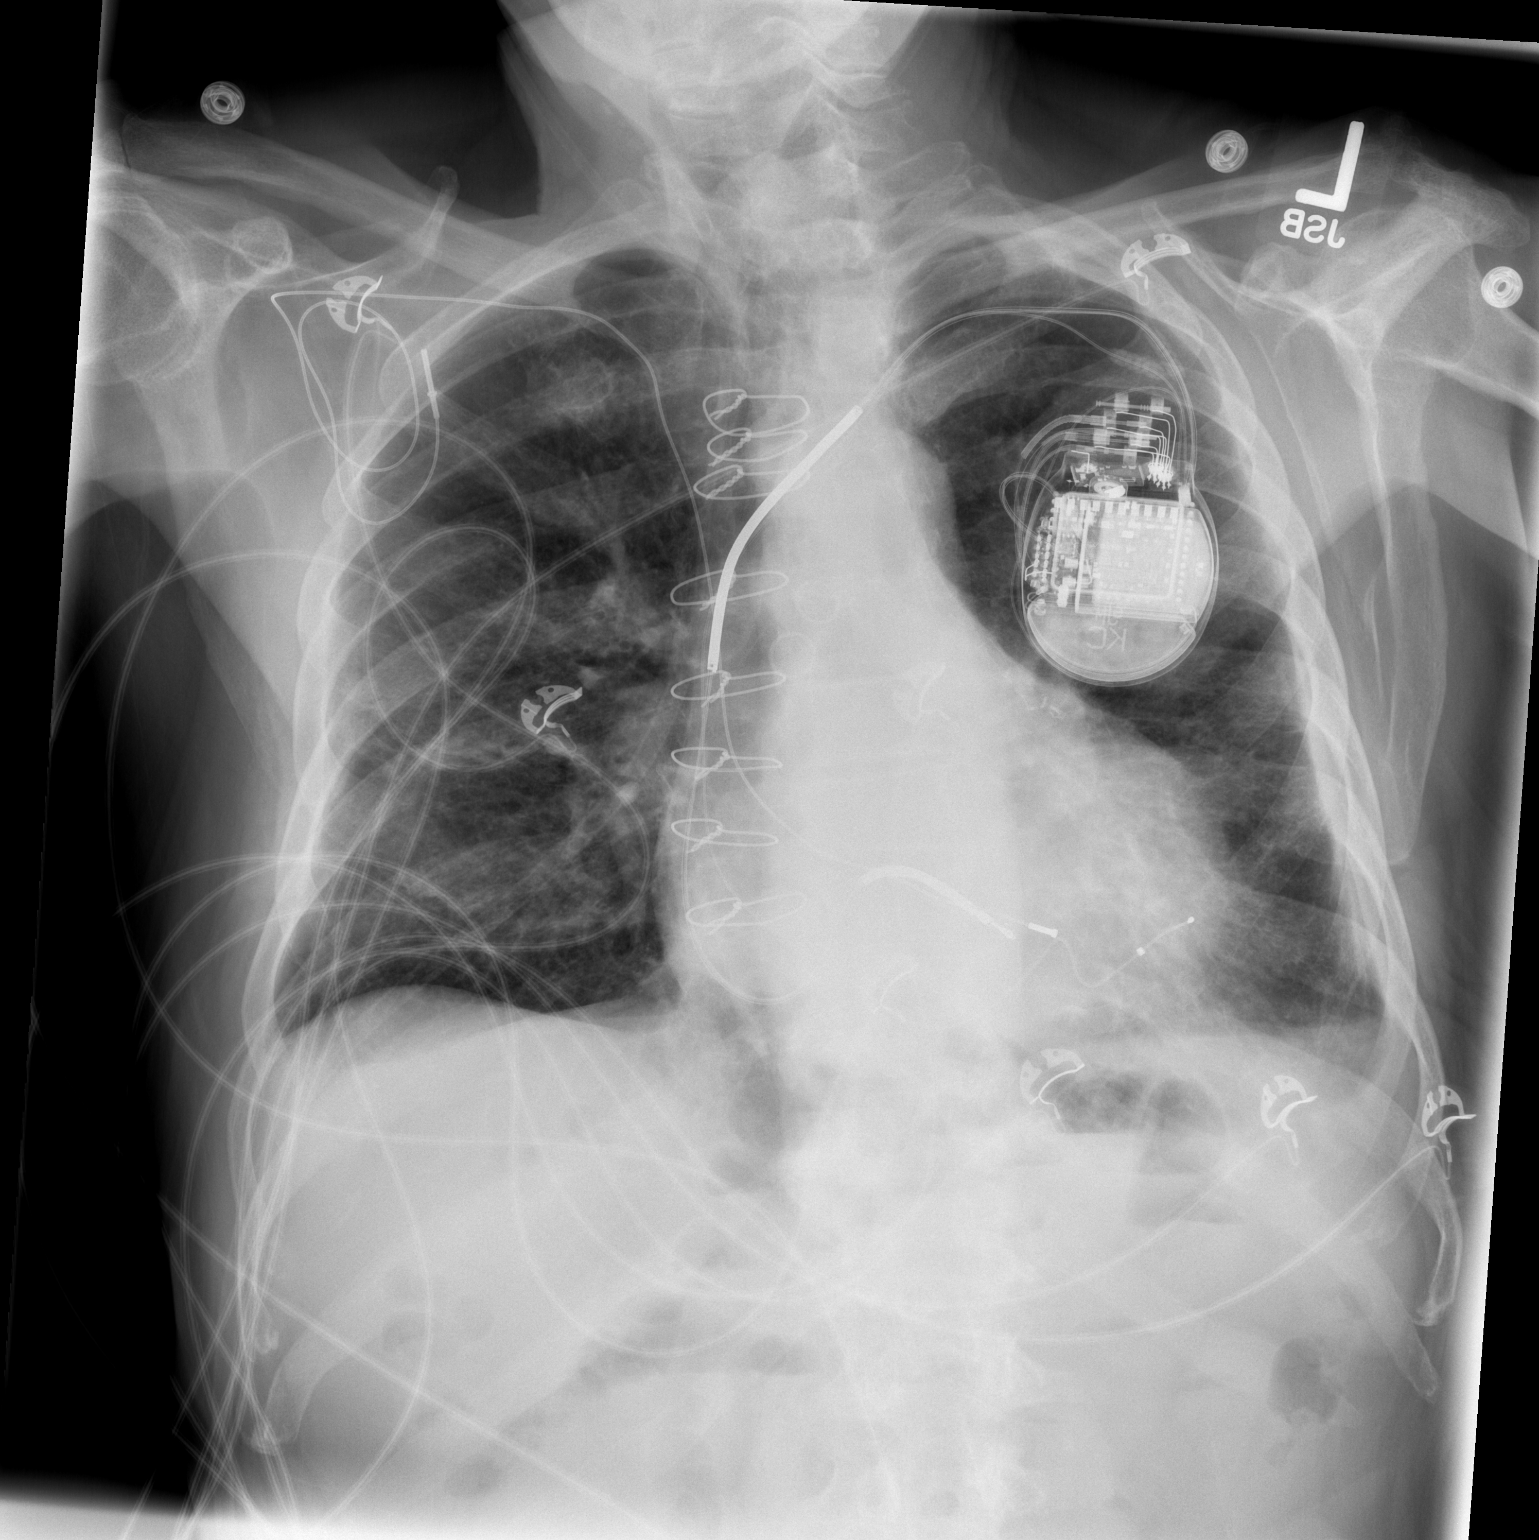

[2 of 2 positions shown; findings below may reference images not displayed]

FINDINGS: AICD is again noted.  Lungs are clear.  Mild cardiomegaly
is present.
IMPRESSION: No acute finding.

## 2012-12-12 IMAGING — CR DG CHEST 2V
2 series · 2 of 2 positions shown · non-contrast
Comparison: 03/24/2011

CLINICAL DATA: Chest pain

CHEST - 2 VIEW

[w chest pa]
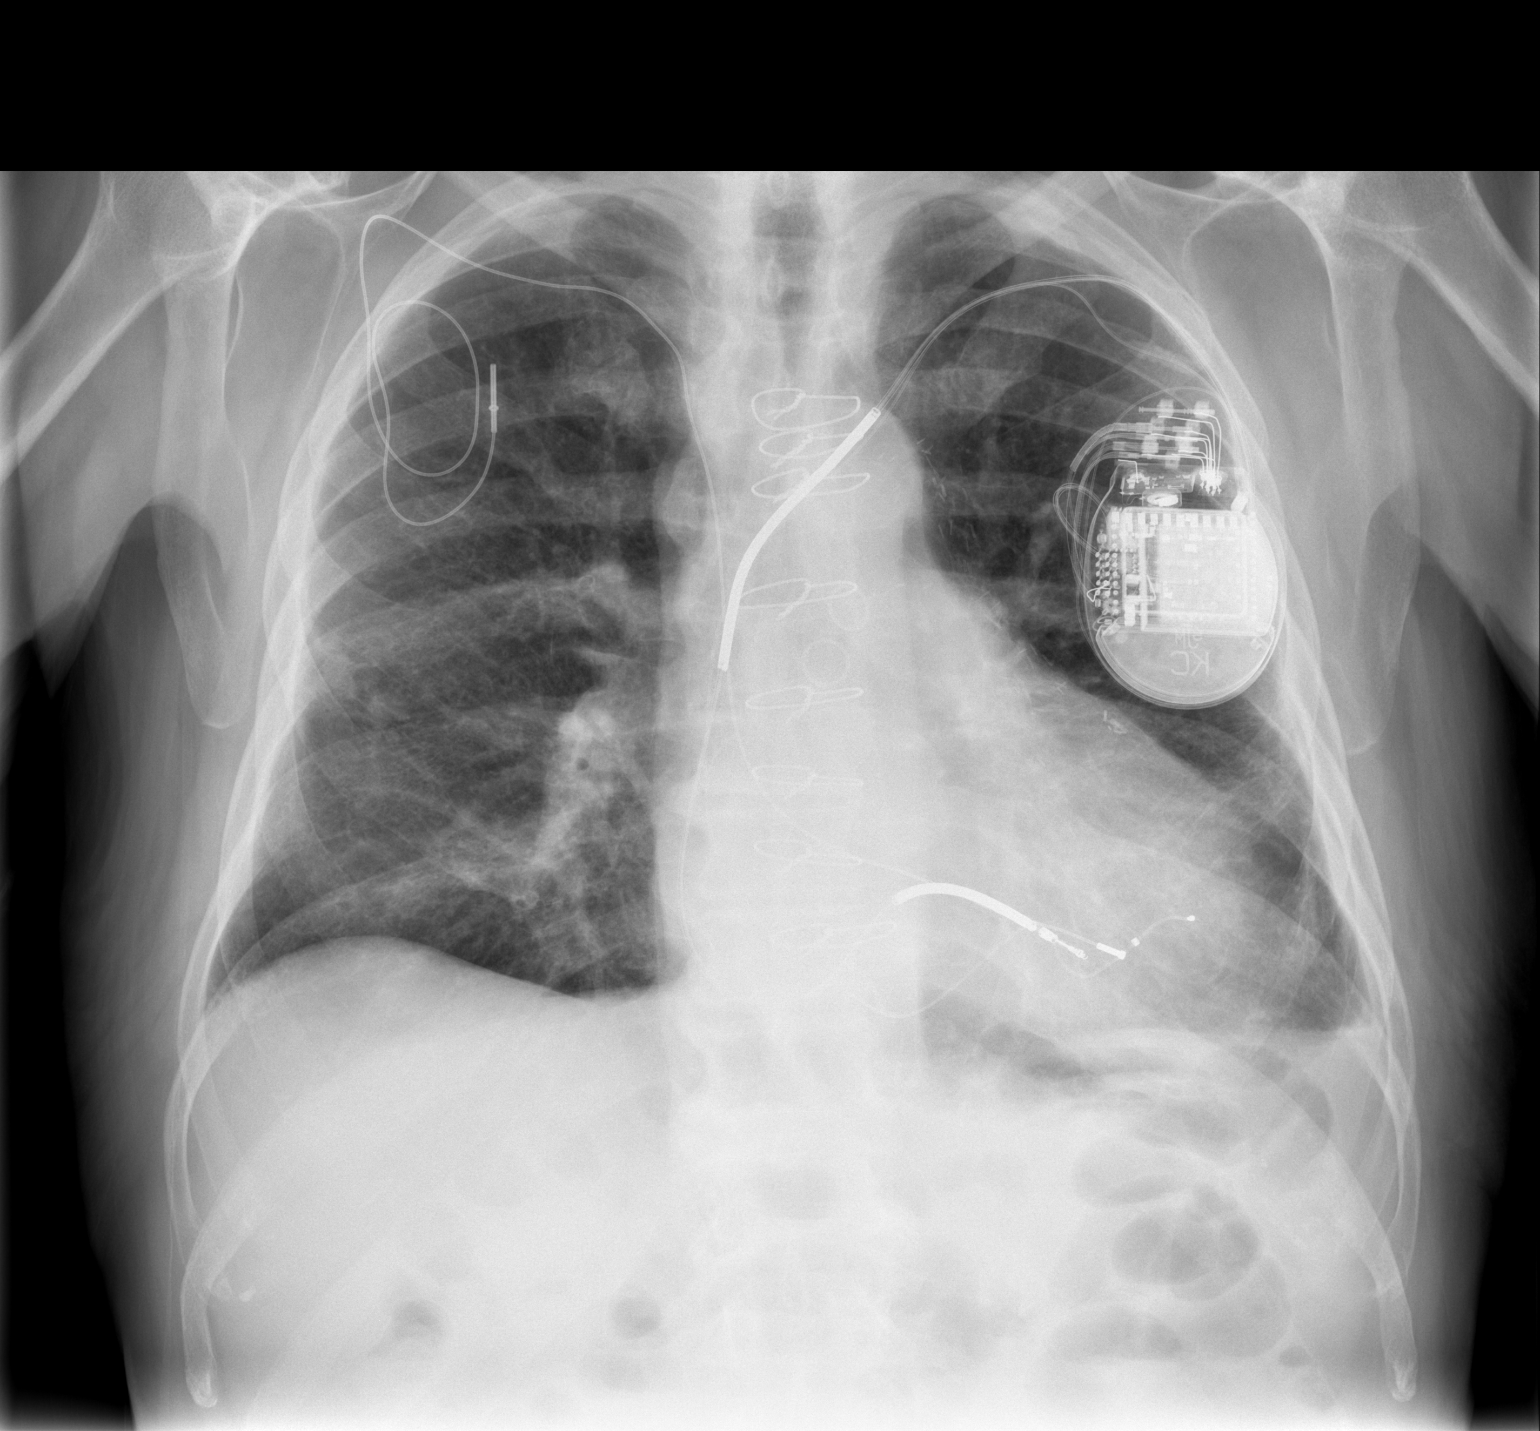

[w chest lat]
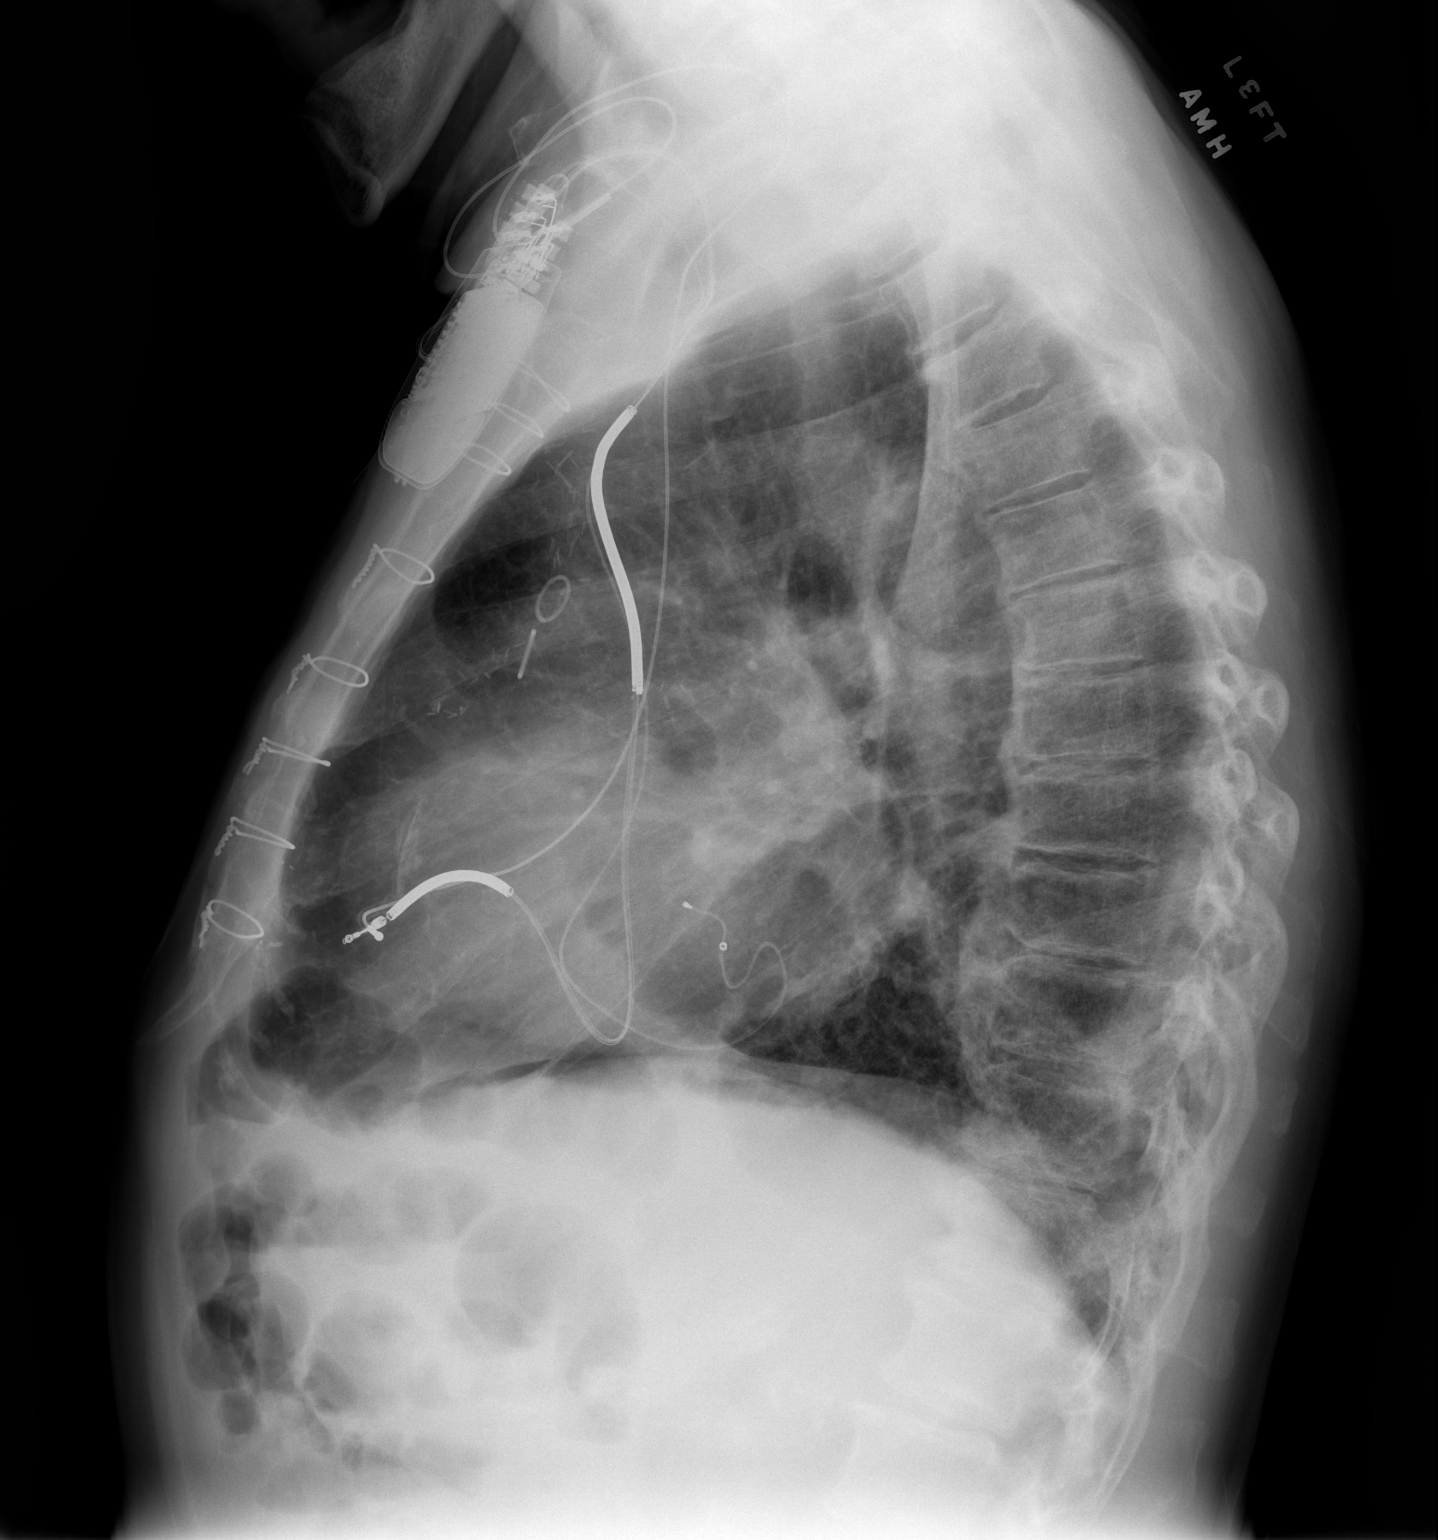

[2 of 2 positions shown; findings below may reference images not displayed]

FINDINGS: There is a left chest wall AICD with leads in the
coronary sinus and right ventricle.

The heart size appears normal.

There is no pleural effusion or edema.  Blunting of the left
costophrenic angle is identified, similar to previous exam.  Likely
due to scarring.  Stable scar like opacity is noted within the
right midlung.

No pulmonary edema or airspace consolidation.
IMPRESSION: 1.  No active cardiopulmonary abnormalities.

## 2012-12-16 ENCOUNTER — Emergency Department (HOSPITAL_COMMUNITY): Payer: PRIVATE HEALTH INSURANCE

## 2012-12-16 ENCOUNTER — Observation Stay (HOSPITAL_COMMUNITY)
Admission: EM | Admit: 2012-12-16 | Discharge: 2012-12-17 | Disposition: A | Discharge status: Home / Self Care | Payer: PRIVATE HEALTH INSURANCE | Attending: Internal Medicine | Admitting: Internal Medicine

## 2012-12-16 ENCOUNTER — Encounter (HOSPITAL_COMMUNITY): Payer: Self-pay | Admitting: Emergency Medicine

## 2012-12-16 DIAGNOSIS — R079 Chest pain, unspecified: Principal | ICD-10-CM

## 2012-12-16 DIAGNOSIS — Z95810 Presence of automatic (implantable) cardiac defibrillator: Secondary | ICD-10-CM | POA: Diagnosis present

## 2012-12-16 DIAGNOSIS — I10 Essential (primary) hypertension: Secondary | ICD-10-CM | POA: Insufficient documentation

## 2012-12-16 DIAGNOSIS — Z9981 Dependence on supplemental oxygen: Secondary | ICD-10-CM

## 2012-12-16 DIAGNOSIS — I252 Old myocardial infarction: Secondary | ICD-10-CM | POA: Insufficient documentation

## 2012-12-16 DIAGNOSIS — I517 Cardiomegaly: Secondary | ICD-10-CM

## 2012-12-16 DIAGNOSIS — J441 Chronic obstructive pulmonary disease with (acute) exacerbation: Secondary | ICD-10-CM | POA: Insufficient documentation

## 2012-12-16 DIAGNOSIS — E119 Type 2 diabetes mellitus without complications: Secondary | ICD-10-CM | POA: Insufficient documentation

## 2012-12-16 DIAGNOSIS — K921 Melena: Secondary | ICD-10-CM

## 2012-12-16 DIAGNOSIS — R55 Syncope and collapse: Secondary | ICD-10-CM

## 2012-12-16 DIAGNOSIS — Z87891 Personal history of nicotine dependence: Secondary | ICD-10-CM | POA: Insufficient documentation

## 2012-12-16 DIAGNOSIS — Z85118 Personal history of other malignant neoplasm of bronchus and lung: Secondary | ICD-10-CM | POA: Insufficient documentation

## 2012-12-16 DIAGNOSIS — N390 Urinary tract infection, site not specified: Secondary | ICD-10-CM

## 2012-12-16 DIAGNOSIS — E7800 Pure hypercholesterolemia, unspecified: Secondary | ICD-10-CM | POA: Insufficient documentation

## 2012-12-16 DIAGNOSIS — K219 Gastro-esophageal reflux disease without esophagitis: Secondary | ICD-10-CM | POA: Insufficient documentation

## 2012-12-16 DIAGNOSIS — Z8546 Personal history of malignant neoplasm of prostate: Secondary | ICD-10-CM | POA: Insufficient documentation

## 2012-12-16 DIAGNOSIS — Z8673 Personal history of transient ischemic attack (TIA), and cerebral infarction without residual deficits: Secondary | ICD-10-CM | POA: Insufficient documentation

## 2012-12-16 DIAGNOSIS — I459 Conduction disorder, unspecified: Secondary | ICD-10-CM

## 2012-12-16 DIAGNOSIS — I482 Chronic atrial fibrillation, unspecified: Secondary | ICD-10-CM | POA: Diagnosis present

## 2012-12-16 DIAGNOSIS — C61 Malignant neoplasm of prostate: Secondary | ICD-10-CM

## 2012-12-16 DIAGNOSIS — R197 Diarrhea, unspecified: Secondary | ICD-10-CM

## 2012-12-16 DIAGNOSIS — Z950 Presence of cardiac pacemaker: Secondary | ICD-10-CM

## 2012-12-16 DIAGNOSIS — I509 Heart failure, unspecified: Secondary | ICD-10-CM | POA: Insufficient documentation

## 2012-12-16 DIAGNOSIS — M129 Arthropathy, unspecified: Secondary | ICD-10-CM | POA: Insufficient documentation

## 2012-12-16 DIAGNOSIS — I4891 Unspecified atrial fibrillation: Secondary | ICD-10-CM

## 2012-12-16 DIAGNOSIS — D649 Anemia, unspecified: Secondary | ICD-10-CM

## 2012-12-16 DIAGNOSIS — N179 Acute kidney failure, unspecified: Secondary | ICD-10-CM

## 2012-12-16 DIAGNOSIS — Z79899 Other long term (current) drug therapy: Secondary | ICD-10-CM | POA: Insufficient documentation

## 2012-12-16 DIAGNOSIS — R531 Weakness: Secondary | ICD-10-CM

## 2012-12-16 DIAGNOSIS — Z888 Allergy status to other drugs, medicaments and biological substances status: Secondary | ICD-10-CM | POA: Insufficient documentation

## 2012-12-16 DIAGNOSIS — Z951 Presence of aortocoronary bypass graft: Secondary | ICD-10-CM

## 2012-12-16 DIAGNOSIS — Z87442 Personal history of urinary calculi: Secondary | ICD-10-CM | POA: Insufficient documentation

## 2012-12-16 DIAGNOSIS — K56609 Unspecified intestinal obstruction, unspecified as to partial versus complete obstruction: Secondary | ICD-10-CM

## 2012-12-16 DIAGNOSIS — Z9861 Coronary angioplasty status: Secondary | ICD-10-CM | POA: Insufficient documentation

## 2012-12-16 DIAGNOSIS — I5022 Chronic systolic (congestive) heart failure: Secondary | ICD-10-CM | POA: Diagnosis present

## 2012-12-16 DIAGNOSIS — I2510 Atherosclerotic heart disease of native coronary artery without angina pectoris: Secondary | ICD-10-CM | POA: Insufficient documentation

## 2012-12-16 DIAGNOSIS — M109 Gout, unspecified: Secondary | ICD-10-CM | POA: Insufficient documentation

## 2012-12-16 HISTORY — DX: Atherosclerotic heart disease of native coronary artery without angina pectoris: I25.10

## 2012-12-16 HISTORY — DX: Gout, unspecified: M10.9

## 2012-12-16 HISTORY — DX: Ischemic cardiomyopathy: I25.5

## 2012-12-16 HISTORY — DX: Chronic systolic (congestive) heart failure: I50.22

## 2012-12-16 LAB — PROTIME-INR
INR: 2.5 — ABNORMAL HIGH (ref 0.00–1.49)
Prothrombin Time: 26.2 seconds — ABNORMAL HIGH (ref 11.6–15.2)

## 2012-12-16 LAB — CBC WITH DIFFERENTIAL/PLATELET
Basophils Relative: 0 % (ref 0–1)
Eosinophils Absolute: 0 10*3/uL (ref 0.0–0.7)
Hemoglobin: 9.8 g/dL — ABNORMAL LOW (ref 13.0–17.0)
Lymphs Abs: 1 10*3/uL (ref 0.7–4.0)
MCH: 28.6 pg (ref 26.0–34.0)
Monocytes Relative: 6 % (ref 3–12)
Neutro Abs: 4.6 10*3/uL (ref 1.7–7.7)
Neutrophils Relative %: 77 % (ref 43–77)
Platelets: 181 10*3/uL (ref 150–400)
RBC: 3.43 MIL/uL — ABNORMAL LOW (ref 4.22–5.81)

## 2012-12-16 LAB — DIGOXIN LEVEL: Digoxin Level: 1.3 ng/mL (ref 0.8–2.0)

## 2012-12-16 LAB — BASIC METABOLIC PANEL
Chloride: 100 mEq/L (ref 96–112)
GFR calc Af Amer: 78 mL/min — ABNORMAL LOW (ref >90)
GFR calc non Af Amer: 67 mL/min — ABNORMAL LOW (ref >90)
Glucose, Bld: 107 mg/dL — ABNORMAL HIGH (ref 70–99)
Potassium: 3.9 mEq/L (ref 3.5–5.1)
Sodium: 136 mEq/L (ref 135–145)

## 2012-12-16 LAB — TROPONIN I
Troponin I: <0.3 ng/mL (ref <0.30)
Troponin I: <0.3 ng/mL (ref <0.30)
Troponin I: <0.3 ng/mL (ref <0.30)

## 2012-12-16 MED ORDER — SODIUM CHLORIDE 0.9 % IV SOLN: 1000.0000 mL | INTRAVENOUS | Status: DC

## 2012-12-16 MED ORDER — FUROSEMIDE 20 MG PO TABS
20.0000 mg | ORAL_TABLET | Freq: Two times a day (BID) | ORAL | Status: DC
  Administered 2012-12-17: 20 mg via ORAL
  Filled 2012-12-16 (×3): qty 1

## 2012-12-16 MED ORDER — ISOSORBIDE MONONITRATE ER 30 MG PO TB24
30.0000 mg | ORAL_TABLET | Freq: Every day | ORAL | Status: DC
  Administered 2012-12-17: 30 mg via ORAL
  Filled 2012-12-16: qty 1

## 2012-12-16 MED ORDER — ALLOPURINOL 300 MG PO TABS
300.0000 mg | ORAL_TABLET | Freq: Every day | ORAL | Status: DC
  Administered 2012-12-17: 300 mg via ORAL
  Filled 2012-12-16: qty 1

## 2012-12-16 MED ORDER — DIGOXIN 125 MCG PO TABS
125.0000 ug | ORAL_TABLET | Freq: Every day | ORAL | Status: DC
  Administered 2012-12-17: 125 ug via ORAL
  Filled 2012-12-16 (×2): qty 1

## 2012-12-16 MED ORDER — PANTOPRAZOLE SODIUM 40 MG PO TBEC
40.0000 mg | DELAYED_RELEASE_TABLET | Freq: Every day | ORAL | Status: DC
  Administered 2012-12-17: 40 mg via ORAL
  Filled 2012-12-16: qty 1

## 2012-12-16 MED ORDER — LORATADINE 10 MG PO TABS
10.0000 mg | ORAL_TABLET | Freq: Every day | ORAL | Status: DC
  Administered 2012-12-17: 10 mg via ORAL
  Filled 2012-12-16: qty 1

## 2012-12-16 MED ORDER — ONDANSETRON HCL 4 MG/2ML IJ SOLN: 4.0000 mg | Freq: Four times a day (QID) | INTRAMUSCULAR | Status: DC | PRN

## 2012-12-16 MED ORDER — GI COCKTAIL ~~LOC~~: 30.0000 mL | Freq: Four times a day (QID) | ORAL | Status: DC | PRN

## 2012-12-16 MED ORDER — NITROGLYCERIN 0.4 MG SL SUBL: 0.4000 mg | SUBLINGUAL_TABLET | SUBLINGUAL | Status: DC | PRN

## 2012-12-16 MED ORDER — ATORVASTATIN CALCIUM 20 MG PO TABS
20.0000 mg | ORAL_TABLET | Freq: Every day | ORAL | Status: DC
  Administered 2012-12-17: 20 mg via ORAL
  Filled 2012-12-16: qty 1

## 2012-12-16 MED ORDER — METOPROLOL SUCCINATE ER 25 MG PO TB24
25.0000 mg | ORAL_TABLET | Freq: Every day | ORAL | Status: DC
  Administered 2012-12-17: 25 mg via ORAL
  Filled 2012-12-16: qty 1

## 2012-12-16 MED ORDER — ACETAMINOPHEN 325 MG PO TABS: 650.0000 mg | ORAL_TABLET | ORAL | Status: DC | PRN

## 2012-12-16 MED ORDER — ALBUTEROL SULFATE (5 MG/ML) 0.5% IN NEBU: 2.5000 mg | INHALATION_SOLUTION | Freq: Four times a day (QID) | RESPIRATORY_TRACT | Status: DC | PRN

## 2012-12-16 MED ORDER — FUROSEMIDE 20 MG PO TABS
20.0000 mg | ORAL_TABLET | Freq: Two times a day (BID) | ORAL | Status: DC
Start: 2012-12-16 — End: 2012-12-16
  Filled 2012-12-16 (×2): qty 1

## 2012-12-16 MED ORDER — RIVAROXABAN 15 MG PO TABS
15.0000 mg | ORAL_TABLET | Freq: Every day | ORAL | Status: DC
  Administered 2012-12-16: 15 mg via ORAL
  Filled 2012-12-16 (×2): qty 1

## 2012-12-16 MED ORDER — LISINOPRIL 10 MG PO TABS
10.0000 mg | ORAL_TABLET | Freq: Every day | ORAL | Status: DC
  Administered 2012-12-17: 10 mg via ORAL
  Filled 2012-12-16: qty 1

## 2012-12-16 MED ORDER — POTASSIUM CHLORIDE CRYS ER 20 MEQ PO TBCR
20.0000 meq | EXTENDED_RELEASE_TABLET | Freq: Every day | ORAL | Status: DC
  Administered 2012-12-17: 20 meq via ORAL
  Filled 2012-12-16: qty 1

## 2012-12-16 MED ORDER — LISINOPRIL 10 MG PO TABS
10.0000 mg | ORAL_TABLET | ORAL | Status: DC
  Filled 2012-12-16: qty 1

## 2012-12-16 NOTE — Progress Notes (Signed)
  Echocardiogram 2D Echocardiogram has been performed.  Arvil Chaco 12/16/2012, 5:26 PM

## 2012-12-16 NOTE — Progress Notes (Signed)
UR Completed Zoey Bidwell Graves-Bigelow, RN,BSN [phone]  

## 2012-12-16 NOTE — Progress Notes (Signed)
Pt admitted from ED to observation for rule out chest pain. At this time, the patient has no complaints of pain. He is a/ox4, ambulatory, on 2L of O2 (which he is home dependent on) IV in LAC -NSL. Skin intact. Placed on tele box 31 and verified with central monitoring. Will continue to monitor. [place], [employer]

## 2012-12-16 NOTE — ED Notes (Signed)
Pt c/o left sided CP where pacemaker is x 1 week; pt sts diarrhea x 1 week with generalized weakness

## 2012-12-16 NOTE — ED Notes (Signed)
Lab notified of change in troponin order-- from I-stat to regular per VO from [facility]

## 2012-12-16 NOTE — ED Provider Notes (Signed)
CSN: [id number]     Arrival date & time 12/16/12  0848 History   First MD Initiated Contact with Patient 12/16/12 [phone]     Chief Complaint  Patient presents with  . Chest Pain  . Diarrhea    Patient is a 79 y.o. male presenting with chest pain and diarrhea. The history is provided by the patient.  Chest Pain Pain location:  L chest (Pt has history of heart disease.  He never knew he was having his heart attacks in the past.) Pain quality: aching   Pain radiates to:  Does not radiate Pain radiates to the back: no   Pain severity:  Moderate Onset quality:  Unable to specify Duration:  2 minutes Timing:  Intermittent Progression:  Worsening Chronicity:  New Relieved by:  Nothing Worsened by:  Nothing tried Ineffective treatments:  None tried Associated symptoms: shortness of breath   Associated symptoms: no abdominal pain, no fever and not vomiting   Diarrhea Quality:  Watery (no blood) Severity:  Moderate Number of episodes:  4-5/day Duration:  7 days Timing:  Intermittent Associated symptoms: no abdominal pain, no fever and no vomiting   Risk factors: no recent antibiotic use and no travel to endemic areas     Past Medical History  Diagnosis Date  . Diabetes mellitus   . Hypertension   . CHF (congestive heart failure)   . S/P CABG (coronary artery bypass graft)   . Gout   . Nephrolithiasis   . Prostate cancer, recur risk not determined whether low, med or high   . Coronary artery disease   . Hypercholesterolemia   . Black lung disease   . Kidney stone 09/2000  . Angina   . MI (myocardial infarction)     "I've had 2; didn't know it"  . CVA (cerebral vascular accident)     "I've had 2; didn't know it"  . Bradycardia, sinus   . On home oxygen therapy   . Prostate cancer 11/01/10    gleason 7, 8, 9, gold seeds 02/08/11  . Lung cancer   . COPD (chronic obstructive pulmonary disease)   . Shortness of breath   . GERD (gastroesophageal reflux disease)   . AICD  (automatic cardioverter/defibrillator) present   . Arthritis    Past Surgical History  Procedure Laterality Date  . Colonoscopy    . Esophagogastroduodenoscopy  02/19/2011    Procedure: ESOPHAGOGASTRODUODENOSCOPY (EGD);  Surgeon: Petra Kuba, MD;  Location: [facility] ENDOSCOPY;  Service: Endoscopy;  Laterality: N/A;  . Upper endoscpopy    . Coronary angioplasty with stent placement  07/1997; 08/1997;03/1998  . Coronary artery bypass graft  01/2000    CABG  X2  . Coronary artery bypass graft  07/2002    CABG X3  . Incision and drainage of wound  08/2002    right thigh; S/P EVH  . Insert / replace / remove pacemaker  1979; 1992; 01/2000;  . Insert / replace / remove pacemaker  09/2003; 06/2006    w/AICD  . Insert / replace / remove pacemaker  12/2004    pacmaker explant  . Shoulder arthroscopy w/ rotator cuff repair  05/2008    left   Family History  Problem Relation Age of Onset  . Alzheimer's disease Father 55  . Cancer Father 84    metastatic prostate cancer  . Diabetes Sister 92  . Diabetes Brother   . Diabetes Brother   . Diabetes Brother   . Hypotension Neg Hx   .  Malignant hyperthermia Neg Hx   . Pseudochol deficiency Neg Hx    History  Substance Use Topics  . Smoking status: Former Smoker    Types: Cigarettes    Quit date: 03/12/1949  . Smokeless tobacco: Current User    Types: Chew  . Alcohol Use: No     Comment: drank until 7 mos ago, per pt 03/19/11    Review of Systems  Constitutional: Negative for fever.  Respiratory: Positive for shortness of breath.   Cardiovascular: Positive for chest pain.  Gastrointestinal: Positive for diarrhea. Negative for vomiting and abdominal pain.  All other systems reviewed and are negative.    Allergies  Aspirin  Home Medications   Current Outpatient Rx  Name  Route  Sig  Dispense  Refill  . albuterol (PROVENTIL) (5 MG/ML) 0.5% nebulizer solution   Nebulization   Take 0.5 mLs (2.5 mg total) by nebulization every 6 (six) hours  as needed for wheezing or shortness of breath.   20 mL   5   . allopurinol (ZYLOPRIM) 300 MG tablet   Oral   Take 300 mg by mouth daily.           [name] atorvastatin (LIPITOR) 20 MG tablet   Oral   Take 20 mg by mouth daily.           [name] CALCIUM-VITAMIN D PO   Oral   Take 1 tablet by mouth daily.          . Cyanocobalamin (VITAMIN B-12 PO)   Oral   Take 1 tablet by mouth 2 (two) times daily.          . digoxin (LANOXIN) 0.125 MG tablet   Oral   Take 125 mcg by mouth daily.           . fish oil-omega-3 fatty acids 1000 MG capsule   Oral   Take 1 g by mouth 2 (two) times daily.          . furosemide (LASIX) 20 MG tablet   Oral   Take 1 tablet (20 mg total) by mouth 2 (two) times daily.   30 tablet   5   . isosorbide mononitrate (IMDUR) 30 MG 24 hr tablet   Oral   Take 30 mg by mouth daily.           [name] lisinopril (PRINIVIL,ZESTRIL) 10 MG tablet   Oral   Take 10 mg by mouth every morning.          . loratadine (CLARITIN) 10 MG tablet   Oral   Take 1 tablet (10 mg total) by mouth daily.   30 tablet   6   . metoprolol succinate (TOPROL-XL) 25 MG 24 hr tablet   Oral   Take 25 mg by mouth every morning.         [name] NITROSTAT 0.4 MG SL tablet   Sublingual   Place 0.4 mg under the tongue every 5 (five) minutes as needed for chest pain.          [name] omeprazole (PRILOSEC) 20 MG capsule   Oral   Take 20 mg by mouth every morning.          . potassium chloride SA (K-DUR,KLOR-CON) 20 MEQ tablet   Oral   Take 20 mEq by mouth daily.         . Rivaroxaban (XARELTO) 15 MG TABS tablet   Oral   Take 15 mg by mouth daily with supper.  BP 125/75  Pulse 60  Temp(Src) 98.1 F (36.7 C) (Oral)  Resp 20  SpO2 100% Physical Exam  Nursing note and vitals reviewed. Constitutional: No distress.  HENT:  Head: Normocephalic and atraumatic.  Right Ear: External ear normal.  Left Ear: External ear normal.  Mouth/Throat: No oropharyngeal exudate.   Mm moist  Eyes: Conjunctivae are normal. Right eye exhibits no discharge. Left eye exhibits no discharge. No scleral icterus.  Neck: Neck supple. No tracheal deviation present.  Cardiovascular: Normal rate, regular rhythm and intact distal pulses.   No murmur heard. Pulmonary/Chest: Effort normal and breath sounds normal. No stridor. No respiratory distress. He has no wheezes. He has no rales.  Abdominal: Soft. Bowel sounds are normal. He exhibits no distension. There is no tenderness. There is no rebound and no guarding.  Musculoskeletal: He exhibits no edema and no tenderness.  Neurological: He is alert. He has normal strength. No sensory deficit. Cranial nerve deficit:  no gross defecits noted. He exhibits normal muscle tone. He displays no seizure activity. Coordination normal.  Skin: Skin is warm and dry. No rash noted. He is not diaphoretic.  Psychiatric: He has a normal mood and affect.    ED Course  Procedures (including critical care time) EKG Rate 63 Ventricular-paced rhythm Abnormal ECG EKG similar in appearance to EKG dated 06/30/2012   Labs Review Labs Reviewed  CBC WITH DIFFERENTIAL - Abnormal; Notable for the following:    RBC 3.43 (*)    Hemoglobin 9.8 (*)    HCT 29.0 (*)    RDW 16.3 (*)    All other components within normal limits  BASIC METABOLIC PANEL - Abnormal; Notable for the following:    Glucose, Bld 107 (*)    GFR calc non Af Amer 67 (*)    GFR calc Af Amer 78 (*)    All other components within normal limits  PRO B NATRIURETIC PEPTIDE - Abnormal; Notable for the following:    Pro B Natriuretic peptide (BNP) 2381.0 (*)    All other components within normal limits  PROTIME-INR - Abnormal; Notable for the following:    Prothrombin Time 26.2 (*)    INR 2.50 (*)    All other components within normal limits  APTT - Abnormal; Notable for the following:    aPTT 42 (*)    All other components within normal limits  DIGOXIN LEVEL  TROPONIN I   Imaging  Review Dg Chest 2 View  12/16/2012   CLINICAL DATA:  Chest pain, weakness  EXAM: CHEST  2 VIEW  COMPARISON:  07/03/2012  FINDINGS: Cardiomediastinal silhouette is stable. Again noted status post CABG. Cardiac pacemaker leads are unchanged in position. Stable bilateral mid lung scarring. Probable chronic mild interstitial prominence without convincing pulmonary edema. No segmental infiltrate. Osteopenia and mild degenerative changes thoracic spine again noted.  IMPRESSION: Again noted status post CABG. Cardiac pacemaker leads are unchanged in position. Stable bilateral mid lung scarring. Probable chronic mild interstitial prominence without convincing pulmonary edema. No segmental infiltrate.   Electronically Signed   By: Natasha Mead M.D.   On: 12/16/2012 09:32    MDM   1. Anemia   2. Chest pain   3. Diarrhea    Pt has known history of heart disease.  Not sure if these symptoms are similar but he is having worsening weakness and intermittent chest pain.  Will admit for further eval.  Diarrhea but no abdominal pain on exam.  Will check stool for occult blood.  May be viral in nature.  No risk factors for C diff.    Celene Kras, MD 12/16/12 [phone]

## 2012-12-16 NOTE — Progress Notes (Signed)
Received report from ED RN Clydie Braun) @ 12:20

## 2012-12-16 NOTE — H&P (Addendum)
[facility] History and Physical  [name] ZOX:[id number] DOB: 1933-02-25 DOA: 12/16/2012  Referring physician: er PCP: Pearson Grippe, MD  Specialists:   Chief Complaint: chest pain  HPI: [name] is a 79 y.o. male  Who presents with left sided chest pain.  No radiation, not reproducible, not worse with eating or activity.  Has been going on since yesterday, lasted 2 min.  Some mild SOB, mild cough.  No fever, + chills  Also has had diarrhea 4-5 x per day, no recent abx, no vomiting, no nausea- has been going on about 1 week   In the ER, Ce was negative, EKG was ok, labs not far from baseline -Hgb was 9.8 down from 10.8.  No blood in stools.  BNP slightly higher than baseline   Review of Systems: all systems reviewed, negative unless stated above   Past Medical History  Diagnosis Date  . Diabetes mellitus   . Hypertension   . CHF (congestive heart failure)   . S/P CABG (coronary artery bypass graft)   . Gout   . Nephrolithiasis   . Prostate cancer, recur risk not determined whether low, med or high   . Coronary artery disease   . Hypercholesterolemia   . Black lung disease   . Kidney stone 09/2000  . Angina   . MI (myocardial infarction)     "I've had 2; didn't know it"  . CVA (cerebral vascular accident)     "I've had 2; didn't know it"  . Bradycardia, sinus   . On home oxygen therapy   . Prostate cancer 11/01/10    gleason 7, 8, 9, gold seeds 02/08/11  . Lung cancer   . COPD (chronic obstructive pulmonary disease)   . Shortness of breath   . GERD (gastroesophageal reflux disease)   . AICD (automatic cardioverter/defibrillator) present   . Arthritis    Past Surgical History  Procedure Laterality Date  . Colonoscopy    . Esophagogastroduodenoscopy  02/19/2011    Procedure: ESOPHAGOGASTRODUODENOSCOPY (EGD);  Surgeon: Petra Kuba, MD;  Location: [facility] ENDOSCOPY;  Service: Endoscopy;  Laterality: N/A;  . Upper endoscpopy    . Coronary angioplasty with  stent placement  07/1997; 08/1997;03/1998  . Coronary artery bypass graft  01/2000    CABG  X2  . Coronary artery bypass graft  07/2002    CABG X3  . Incision and drainage of wound  08/2002    right thigh; S/P EVH  . Insert / replace / remove pacemaker  1979; 1992; 01/2000;  . Insert / replace / remove pacemaker  09/2003; 06/2006    w/AICD  . Insert / replace / remove pacemaker  12/2004    pacmaker explant  . Shoulder arthroscopy w/ rotator cuff repair  05/2008    left   Social History:  reports that he quit smoking about 63 years ago. His smoking use included Cigarettes. He smoked 0.00 packs per day. His smokeless tobacco use includes Chew. He reports that he does not drink alcohol or use illicit drugs.   Allergies  Allergen Reactions  . Aspirin Hives    Family History  Problem Relation Age of Onset  . Alzheimer's disease Father 16  . Cancer Father 77    metastatic prostate cancer  . Diabetes Sister 20  . Diabetes Brother   . Diabetes Brother   . Diabetes Brother   . Hypotension Neg Hx   . Malignant hyperthermia Neg Hx   . Pseudochol deficiency Neg Hx  Prior to Admission medications   Medication Sig Start Date End Date Taking? Authorizing Provider  albuterol (PROVENTIL) (5 MG/ML) 0.5% nebulizer solution Take 0.5 mLs (2.5 mg total) by nebulization every 6 (six) hours as needed for wheezing or shortness of breath. 07/03/12  Yes Massie Maroon, MD  allopurinol (ZYLOPRIM) 300 MG tablet Take 300 mg by mouth daily.     Yes Historical Provider, MD  atorvastatin (LIPITOR) 20 MG tablet Take 20 mg by mouth daily.     Yes Historical Provider, MD  CALCIUM-VITAMIN D PO Take 1 tablet by mouth daily.    Yes Historical Provider, MD  Cyanocobalamin (VITAMIN B-12 PO) Take 1 tablet by mouth 2 (two) times daily.    Yes Historical Provider, MD  digoxin (LANOXIN) 0.125 MG tablet Take 125 mcg by mouth daily.     Yes Historical Provider, MD  fish oil-omega-3 fatty acids 1000 MG capsule Take 1 g by  mouth 2 (two) times daily.    Yes Historical Provider, MD  furosemide (LASIX) 20 MG tablet Take 1 tablet (20 mg total) by mouth 2 (two) times daily. 07/03/12  Yes Massie Maroon, MD  isosorbide mononitrate (IMDUR) 30 MG 24 hr tablet Take 30 mg by mouth daily.     Yes Historical Provider, MD  lisinopril (PRINIVIL,ZESTRIL) 10 MG tablet Take 10 mg by mouth every morning.    Yes Historical Provider, MD  loratadine (CLARITIN) 10 MG tablet Take 1 tablet (10 mg total) by mouth daily. 07/03/12  Yes Massie Maroon, MD  metoprolol succinate (TOPROL-XL) 25 MG 24 hr tablet Take 25 mg by mouth every morning.   Yes Historical Provider, MD  NITROSTAT 0.4 MG SL tablet Place 0.4 mg under the tongue every 5 (five) minutes as needed for chest pain.  10/17/11  Yes Historical Provider, MD  omeprazole (PRILOSEC) 20 MG capsule Take 20 mg by mouth every morning.    Yes Historical Provider, MD  potassium chloride SA (K-DUR,KLOR-CON) 20 MEQ tablet Take 20 mEq by mouth daily.   Yes Historical Provider, MD  Rivaroxaban (XARELTO) 15 MG TABS tablet Take 15 mg by mouth daily with supper.   Yes Historical Provider, MD   Physical Exam: Filed Vitals:   12/16/12 1036  BP: 125/75  Pulse: 60  Temp:   Resp: 20    Constitutional: No distress.   Neck: Neck supple. No tracheal deviation present.  Cardiovascular: Normal rate, regular rhythm and intact distal pulses.  No murmur heard.  Pulmonary/Chest: Effort normal and breath sounds normal. No stridor. No respiratory distress. He has no wheezes. He has no rales.  Abdominal: Soft. Bowel sounds are normal. He exhibits no distension. There is no tenderness. There is no rebound and no guarding.  Musculoskeletal: He exhibits no edema and no tenderness.  Neurological: He is alert. He has normal strength. No sensory deficit. Coordination normal.  Skin: Skin is warm and dry. No rash noted. He is not diaphoretic.  Psychiatric: He has a normal mood and affect.    Labs on Admission:  Basic  Metabolic Panel:  Recent Labs Lab 12/16/12 0910  NA 136  K 3.9  CL 100  CO2 20  GLUCOSE 107*  BUN 14  CREATININE 1.03  CALCIUM 8.8   Liver Function Tests: No results found for this basename: AST, ALT, ALKPHOS, BILITOT, PROT, ALBUMIN,  in the last 168 hours No results found for this basename: LIPASE, AMYLASE,  in the last 168 hours No results found for this basename: AMMONIA,  in the last 168 hours CBC:  Recent Labs Lab 12/16/12 0910  WBC 6.0  NEUTROABS 4.6  HGB 9.8*  HCT 29.0*  MCV 84.5  PLT 181   Cardiac Enzymes:  Recent Labs Lab 12/16/12 0915  TROPONINI <0.30    BNP (last 3 results)  Recent Labs  07/02/12 0600 07/03/12 0842 12/16/12 0910  PROBNP 1582.0* 1294.0* 2381.0*   CBG: No results found for this basename: GLUCAP,  in the last 168 hours  Radiological Exams on Admission: Dg Chest 2 View  12/16/2012   CLINICAL DATA:  Chest pain, weakness  EXAM: CHEST  2 VIEW  COMPARISON:  07/03/2012  FINDINGS: Cardiomediastinal silhouette is stable. Again noted status post CABG. Cardiac pacemaker leads are unchanged in position. Stable bilateral mid lung scarring. Probable chronic mild interstitial prominence without convincing pulmonary edema. No segmental infiltrate. Osteopenia and mild degenerative changes thoracic spine again noted.  IMPRESSION: Again noted status post CABG. Cardiac pacemaker leads are unchanged in position. Stable bilateral mid lung scarring. Probable chronic mild interstitial prominence without convincing pulmonary edema. No segmental infiltrate.   Electronically Signed   By: Natasha Mead M.D.   On: 12/16/2012 09:32    EKG: Independently reviewed.v-paced  Assessment/Plan Active Problems:   Chest pain   1. Chest pain- obs, EKG v paced, CE negative so far- will cycle, monitor on tele 2. Increased BNP- mild SOB- resume home lasix, no IVF, weight patient, check echo- last EF 25% 3. Anemia- heme test stools 4. Diarrhea- monitor, ? Viral - no  exposure to abx or hospital    Code Status: full Family Communication: patient Disposition Plan: home  Time spent: 75 min  Benjamine Mola JESSICA [facility] Pager [phone]  If 7PM-7AM, please contact night-coverage [email or web address] Password [email or web address] 12/16/2012, 12:04 PM

## 2012-12-16 NOTE — ED Notes (Signed)
Pt brought back to room via wheelchair with family in tow; pt getting undressed and into a gown at this time

## 2012-12-17 ENCOUNTER — Encounter (HOSPITAL_COMMUNITY): Payer: Self-pay | Admitting: Nurse Practitioner

## 2012-12-17 DIAGNOSIS — I4891 Unspecified atrial fibrillation: Secondary | ICD-10-CM

## 2012-12-17 DIAGNOSIS — I5022 Chronic systolic (congestive) heart failure: Secondary | ICD-10-CM

## 2012-12-17 DIAGNOSIS — Z95810 Presence of automatic (implantable) cardiac defibrillator: Secondary | ICD-10-CM

## 2012-12-17 DIAGNOSIS — N179 Acute kidney failure, unspecified: Secondary | ICD-10-CM

## 2012-12-17 LAB — TROPONIN I: Troponin I: <0.3 ng/mL (ref <0.30)

## 2012-12-17 MED ORDER — FUROSEMIDE 20 MG PO TABS: 20.0000 mg | ORAL_TABLET | Freq: Every day | ORAL | Status: DC

## 2012-12-17 MED ORDER — MECLIZINE HCL 12.5 MG PO TABS: 12.5000 mg | ORAL_TABLET | Freq: Two times a day (BID) | ORAL | Status: DC | PRN

## 2012-12-17 MED ORDER — METOPROLOL SUCCINATE ER 25 MG PO TB24: 25.0000 mg | ORAL_TABLET | Freq: Every day | ORAL | Status: DC

## 2012-12-17 NOTE — Progress Notes (Addendum)
Called to see patient to check BiV ICD implant site. Patient reports "burning" discomfort located at implant site that started yesterday and continued for several hours. He states it is now gone. He also reports he as noticed the device seems to have moved more toward his axilla. He denies ICD shocks. He denies swelling or redness at the site. He denies fever or chills.   His device was interrogated today by industry. This revealed normal BiV ICD function with good battery status and stable lead measurements. On exam, his left upper chest / implant site is intact and well healed. Position is slightly lateral to incisional scar but appears appropriate. There is no evidence of inflammation or infection. There is no erythema, edema, fluctuance, tenderness to palpation or warmth. There is no retraction.    Instructed patient to keep ICD follow-up appointment with Dr. [name] on 01/06/2013.

## 2012-12-17 NOTE — Consult Note (Signed)
CARDIOLOGY CONSULT NOTE  Patient ID: [name] MRN: [id number], DOB/AGE: 08-09-1932   Admit date: 12/16/2012 Date of Consult: 12/17/2012  Primary Physician: Pearson Grippe, MD Primary Cardiologist: Reece Agar. [name], MD   Pt. Profile  [name] is a 79 year old male with CAD (stenting x2 '99, stent x1 '00, CABGx3 '04) ICM (EF 20-25% on 12/17/12 echo), AICD, DMII, HTN, HL, and history of AFib who presented to ED complaining of 1 week history of CP and diarrhea.   Problem List  Past Medical History  Diagnosis Date  . Diabetes mellitus   . Hypertension   . Chronic systolic CHF (congestive heart failure)     a. 12/2012 Echo: EF 20-25%, mid-dist antsept AK, mod dil LA.  [name] CAD (coronary artery disease)     a. 07/2002 CABG x 3: LIMA->LAD, VG->Diag, VG->OM;  b. 06/2006 Cath: LM 50-60ost/p, LAD patent mid stent, D1 sev dzs, D2 patent stent, LCX nl, OM2 sev sten prox, RCA large/nl, VG->Diag nl, VG->OM nl, LIMA->LAD atretic, EF 30%.  . Ischemic cardiomyopathy     a. 06/2006 s/p SJM Promote RF, model 3207-36 BiV ICD, ser #: [id number].  [name] Hypercholesterolemia   . Black lung disease   . Nephrolithiasis 09/2000  . CVA (cerebral vascular accident)   . Prostate cancer 11/01/10    gleason 7, 8, 9, gold seeds 02/08/11  . Lung cancer   . COPD (chronic obstructive pulmonary disease)     a. On home O2.  [name] GERD (gastroesophageal reflux disease)   . Arthritis   . Gout     Past Surgical History  Procedure Laterality Date  . Colonoscopy    . Esophagogastroduodenoscopy  02/19/2011    Procedure: ESOPHAGOGASTRODUODENOSCOPY (EGD);  Surgeon: Petra Kuba, MD;  Location: [facility] ENDOSCOPY;  Service: Endoscopy;  Laterality: N/A;  . Upper endoscpopy    . Coronary angioplasty with stent placement  07/1997; 08/1997;03/1998  . Coronary artery bypass graft  07/2002    CABG X3  . Incision and drainage of wound  08/2002    right thigh; S/P EVH  . Insert / replace / remove pacemaker  1979; 1992; 01/2000;  . Insert / replace /  remove pacemaker  09/2003; 06/2006    w/AICD  . Insert / replace / remove pacemaker  12/2004    pacmaker explant  . Shoulder arthroscopy w/ rotator cuff repair  05/2008    left    Allergies  Allergies  Allergen Reactions  . Aspirin Hives   HPI   Mr. [name] is a 79 year old male with CAD (CABGx2 '01, CABGx3 '04) ICM (EF 20-25% on 12/17/12 echo), PPM/AICD, DMII, HTN, elevated CHO, and history of AFib who presented to ED complaining of 1 week history of CP, diarrhea, and weakness. About 8-9 days prior to presentation he began to have non-bloody diarrhea. This has occurred 5-6 times per day since the onset and is still not resolved as of today. The weakness has been present over this same time period. Approximately 6-7 days ago he began to experience left sided CP around the site of his pacer. The CP has been intermittent over the past week. He has walked to his mailbox daily and does not get any CP with this. He mostly feels it while lying still. The episodes typically last around 1 minute and radiate to the center of his chest. They always resolve on their own and he has not required any SL NTG. He rates the CP 10/10 at it's worst and  0/10 when the episodes completely resolve. Palpation actually helps the pain to improve a little bit. This pacemaker was implanted back in 2008 and he has not had any issues like this prior to the past week. He states he is scheduled to have his pacer interrogated in a month. He has baseline NYHA class III symptoms as he stops several times to catch his breath while walking the short distance to his mailbox.   Upon presentation to the ED he has had a couple episodes of CP, none at all today. Troponin has been negative x3. BNP 2381, up from 1294 in 4/14. EKG is v paced. CXR 12/16/12 showed no convincing evidence of pulmonary edema and no segmental infiltrate. Echo 12/16/12 showed EF 20-25% with akinesis of mid-distal anteroseptal wall. This is essentially unchanged from 4/14  echo. Of note, Hgb is 9.8, down from 11 back in 4/14.   Inpatient Medications  . allopurinol  300 mg Oral Daily  . atorvastatin  20 mg Oral Daily  . digoxin  125 mcg Oral Daily  . furosemide  20 mg Oral BID  . isosorbide mononitrate  30 mg Oral Daily  . lisinopril  10 mg Oral Daily  . loratadine  10 mg Oral Daily  . metoprolol succinate  25 mg Oral Daily  . pantoprazole  40 mg Oral Daily  . potassium chloride SA  20 mEq Oral Daily  . Rivaroxaban  15 mg Oral Q supper    Family History Family History  Problem Relation Age of Onset  . Alzheimer's disease Father 72  . Cancer Father 53    metastatic prostate cancer  . Diabetes Sister 55  . Diabetes Brother   . Diabetes Brother   . Diabetes Brother   . Hypotension Neg Hx   . Malignant hyperthermia Neg Hx   . Pseudochol deficiency Neg Hx      Social History History   Social History  . Marital Status: Widowed    Spouse Name: N/A    Number of Children: N/A  . Years of Education: N/A   Occupational History  . retired     Medical laboratory scientific officer   Social History Main Topics  . Smoking status: Former Smoker    Types: Cigarettes    Quit date: 03/12/1949  . Smokeless tobacco: Current User    Types: Chew  . Alcohol Use: No     Comment: drank until 7 mos ago, per pt 03/19/11  . Drug Use: No  . Sexual Activity: Yes   Other Topics Concern  . Not on file   Social History Narrative  . No narrative on file     Review of Systems  General:  No chills, fever, night sweats or weight changes.  Cardiovascular:  positive chest pain, denies dyspnea on exertion, orthopnea, palpitations, paroxysmal nocturnal dyspnea. Respiratory: No cough, dyspnea Abdominal:   Positive diarrhea. No nausea, vomiting, bright red blood per rectum, melena, or hematemesis All other systems reviewed and are otherwise negative except as noted above.  Physical Exam  Blood pressure 91/48, pulse 62, temperature 97.6 F (36.4 C), temperature source Oral, resp. rate  17, height 5\' 6"  (1.676 m), weight 137 lb (62.143 kg), SpO2 100.00%.  General: Pleasant, NAD Psych: Normal affect. Neuro: Alert and oriented X 3. Moves all extremities spontaneously.  Neck: Supple without bruits or JVD. Lungs:  Resp regular and unlabored, no adventitious sounds.  Heart: Distant heart sounds. RRR no s3, s4, 2/6 syst murmur @ llsb. Chest: No swelling, tenderness, or  erythema around pacemaker site.  Abdomen: Soft, non-tender, non-distended, BS + x 4.  Extremities: Trace bilateral pitting edema. DP/PT/Radials 2+ and equal bilaterally.  Labs   Recent Labs  12/16/12 0915 12/16/12 1507 12/16/12 2024 12/17/12 0050  TROPONINI <0.30 <0.30 <0.30 <0.30   Lab Results  Component Value Date   WBC 6.0 12/16/2012   HGB 9.8* 12/16/2012   HCT 29.0* 12/16/2012   MCV 84.5 12/16/2012   PLT 181 12/16/2012     Recent Labs Lab 12/16/12 0910  NA 136  K 3.9  CL 100  CO2 20  BUN 14  CREATININE 1.03  CALCIUM 8.8  GLUCOSE 107*   Lab Results  Component Value Date   CHOL 106 05/15/2011   HDL 22* 05/15/2011   LDLCALC 14 05/15/2011   TRIG 348* 05/15/2011   PT 26.2. PTT 42. INR 2.5   Radiology/Studies  Dg Chest 2 View  12/16/2012   CLINICAL DATA:  Chest pain, weakness  EXAM: CHEST  2 VIEW  COMPARISON:  07/03/2012  FINDINGS: Cardiomediastinal silhouette is stable. Again noted status post CABG. Cardiac pacemaker leads are unchanged in position. Stable bilateral mid lung scarring. Probable chronic mild interstitial prominence without convincing pulmonary edema. No segmental infiltrate. Osteopenia and mild degenerative changes thoracic spine again noted.  IMPRESSION: Again noted status post CABG. Cardiac pacemaker leads are unchanged in position. Stable bilateral mid lung scarring. Probable chronic mild interstitial prominence without convincing pulmonary edema. No segmental infiltrate.   Electronically Signed   By: Natasha Mead M.D.   On: 12/16/2012 09:32   2D Echocardiogram 12/16/12  - Left  ventricle: The cavity size was mildly dilated. Wall thickness was increased in a pattern of mild LVH. Systolic function was severely reduced. The estimated ejection fraction was in the range of 20% to 25%. There is akinesis of the mid-distalanteroseptal myocardium. - Left atrium: The atrium was moderately dilated.  ECG  Ventricular paced at 63 bpm, underlying afib.  ASSESSMENT AND PLAN  Mr. Keetch is a 79 year old male with known CAD, ICM, and PPM/AICD who presented to ED with atypical CP with subsequent negative troponins and paced EKG.   1. Chest pain/CAD: Most likely not cardiac in origin. CP non-exertional. Has been occuring for past week but troponins have been negative x3 in the hospital. As it is occuring around the site of his pacemaker, in the setting of lateral drift of device, will have EP interrogate to ensure proper functioning. Will not pursue further ischemic evaluation at this time.  Provided device interrogation is wnl, ok to d/c from our standpoint and have him f/u in clinic with Dr. [name].  Cont bb/statin/nitrate.  No asa 2/2 allergy/xarelto.  2. Chronic systolic CHF/ICM: NYHA class III symptoms. Continue bb, statin, acei, digoxin, nitrate, lasix. He has an AICD.  BNP was slightly elevated to 2381 at this hospitalization, up from 1294 on 4/14. Monitor Is&Os, wts. BP last night and this AM has been trending lower than initial BPs. Most recent 98/52. Would rec decreasing Lasix to QD if pt symptomatic.   3. AFib: on chronic xarelto.  Paced in setting of h/o CHB.  4. Mild anemia: Hgb 9.8 down from 11 on 4/14. IM following.   5. DMII: BG 107 in hospital. IM following.   Signed, Nicolasa Ducking, NP 12/17/2012, 1:07 PM  Attending Note:   The patient was seen and examined.  Agree with assessment and plan as noted above.  Changes made to the above note as needed.  The discomfort  is located at / under the ICD site.  There are no suspicious signs or symptoms that sound like  angina.   He has been exercising without problems.   There is no evidence of infection.    I would recommend that we interrogate the device to insure that it is working properly. He said that his home interrogation device has been beeping .    Vesta Mixer, Montez Hageman., MD, [facility] 12/17/2012, 1:42 PM

## 2012-12-17 NOTE — Discharge Summary (Signed)
Physician Discharge Summary  Patient ID: [name] MRN: [id number] DOB/AGE: 77-Jul-1934 77 y.o.  Admit date: 12/16/2012 Discharge date: 12/17/2012  Primary Care Physician:  Pearson Grippe, MD  Discharge Diagnoses:   Present on Admission:  . Chest pain . Systolic CHF, chronic . ICD-St.Jude . Atrial fibrillation  Consults:  Cardiology, Dr. Elease Hashimoto                    EP SERVICE   Recommendations for Outpatient Follow-up:  1 please adjust his medications outpatient, his systolic blood pressure was noted to be in 90s. I have decreased his Lasix to 20 mg daily.    Allergies:   Allergies  Allergen Reactions  . Aspirin Hives     Discharge Medications:   Medication List         albuterol (5 MG/ML) 0.5% nebulizer solution  Commonly known as:  PROVENTIL  Take 0.5 mLs (2.5 mg total) by nebulization every 6 (six) hours as needed for wheezing or shortness of breath.     allopurinol 300 MG tablet  Commonly known as:  ZYLOPRIM  Take 300 mg by mouth daily.     atorvastatin 20 MG tablet  Commonly known as:  LIPITOR  Take 20 mg by mouth daily.     CALCIUM-VITAMIN D PO  Take 1 tablet by mouth daily.     digoxin 0.125 MG tablet  Commonly known as:  LANOXIN  Take 125 mcg by mouth daily.     fish oil-omega-3 fatty acids 1000 MG capsule  Take 1 g by mouth 2 (two) times daily.     furosemide 20 MG tablet  Commonly known as:  LASIX  Take 1 tablet (20 mg total) by mouth daily.     isosorbide mononitrate 30 MG 24 hr tablet  Commonly known as:  IMDUR  Take 30 mg by mouth daily.     lisinopril 10 MG tablet  Commonly known as:  PRINIVIL,ZESTRIL  Take 10 mg by mouth every morning.     loratadine 10 MG tablet  Commonly known as:  CLARITIN  Take 1 tablet (10 mg total) by mouth daily.     metoprolol succinate 25 MG 24 hr tablet  Commonly known as:  TOPROL-XL  Take 25 mg by mouth every morning.     NITROSTAT 0.4 MG SL tablet  Generic drug:  nitroGLYCERIN  Place 0.4 mg under  the tongue every 5 (five) minutes as needed for chest pain.     omeprazole 20 MG capsule  Commonly known as:  PRILOSEC  Take 20 mg by mouth every morning.     potassium chloride SA 20 MEQ tablet  Commonly known as:  K-DUR,KLOR-CON  Take 20 mEq by mouth daily.     Rivaroxaban 15 MG Tabs tablet  Commonly known as:  XARELTO  Take 15 mg by mouth daily with supper.     VITAMIN B-12 PO  Take 1 tablet by mouth 2 (two) times daily.         Brief H and P: For complete details please refer to admission H and P, but in brief patient is a 79 year old male who presented with chest pain. Patient reported that he was having left-sided chest pain with no radiation, not reproducible, not worse with eating or activity. Patient reported the chest pain was going on for a day before and lasted about 2 minutes with mild shortness of breath, mild cough with no fevers. Patient also reported diarrhea 4-5 times per day for  a week prior to admission   Hospital Course:  Chest pain with known history of coronary artery disease, stenting x2 '99, stent x1 '00, CABGx3 '04) ICM - Resolved,  cardiac enzymes remained negative for ACS. 2-D echo showed EF of 20-25%, with akinesis of the mid to distal anteroseptal myocardium. Cardiology was consulted and felt that it was most likely not cardiac. Also was noticed that it occurred around the site of his pacemaker, in the setting of a lateral drift of the device. EP was consulted, patient was seen by electrophysiology, patient had mentioned burning discomfort at the ICD site, which was completely resolved.he denied ICD shocks. He is device was intubated and revealed normal BiV ICD function with good battery status and stable lead measurements. There is no evidence of inflammation or infection. Patient was cleared by cardiology to be DC'd home. Patient was instructed to keep his appointment on 01/06/13 with Dr. [name].  Dizziness: Patient did mention that his chronic issue with  dizziness which appeared to be postural and occasional vertigo feeling. I decreased his Lasix to 20 mg daily, please adjust the medications outpatient. His SBP was in 90s, asymptomatic.  Patient ambulated in the hallway without any difficulty    Day of Discharge BP 91/48  Pulse 62  Temp(Src) 97.6 F (36.4 C) (Oral)  Resp 17  Ht 5\' 6"  (1.676 m)  Wt 62.143 kg (137 lb)  BMI 22.12 kg/m2  SpO2 100%  Physical Exam: General: Alert and awake oriented x3 not in any acute distress. CVS: S1-S2 clear, 2/6 SEM  Chest: clear to auscultation bilaterally, no wheezing rales or rhonchi Abdomen: soft nontender, nondistended, normal bowel sounds Extremities: no cyanosis, clubbing or edema noted bilaterally   The results of significant diagnostics from this hospitalization (including imaging, microbiology, ancillary and laboratory) are listed below for reference.    LAB RESULTS: Basic Metabolic Panel:  Recent Labs Lab 12/16/12 0910  NA 136  K 3.9  CL 100  CO2 20  GLUCOSE 107*  BUN 14  CREATININE 1.03  CALCIUM 8.8   Liver Function Tests: No results found for this basename: AST, ALT, ALKPHOS, BILITOT, PROT, ALBUMIN,  in the last 168 hours No results found for this basename: LIPASE, AMYLASE,  in the last 168 hours No results found for this basename: AMMONIA,  in the last 168 hours CBC:  Recent Labs Lab 12/16/12 0910  WBC 6.0  NEUTROABS 4.6  HGB 9.8*  HCT 29.0*  MCV 84.5  PLT 181   Cardiac Enzymes:  Recent Labs Lab 12/16/12 2024 12/17/12 0050  TROPONINI <0.30 <0.30   BNP: No components found with this basename: POCBNP,  CBG: No results found for this basename: GLUCAP,  in the last 168 hours  Significant Diagnostic Studies:  Dg Chest 2 View  12/16/2012   CLINICAL DATA:  Chest pain, weakness  EXAM: CHEST  2 VIEW  COMPARISON:  07/03/2012  FINDINGS: Cardiomediastinal silhouette is stable. Again noted status post CABG. Cardiac pacemaker leads are unchanged in position. Stable  bilateral mid lung scarring. Probable chronic mild interstitial prominence without convincing pulmonary edema. No segmental infiltrate. Osteopenia and mild degenerative changes thoracic spine again noted.  IMPRESSION: Again noted status post CABG. Cardiac pacemaker leads are unchanged in position. Stable bilateral mid lung scarring. Probable chronic mild interstitial prominence without convincing pulmonary edema. No segmental infiltrate.   Electronically Signed   By: Natasha Mead M.D.   On: 12/16/2012 09:32    2D ECHO: Study Conclusions  - Left ventricle: The  cavity size was mildly dilated. Wall thickness was increased in a pattern of mild LVH. Systolic function was severely reduced. The estimated ejection fraction was in the range of 20% to 25%. There is akinesis of the mid-distalanteroseptal myocardium. - Left atrium: The atrium was moderately dilated.    Disposition and Follow-up:     Discharge Orders   Future Appointments Provider Department Dept Phone   01/06/2013 2:00 PM Marinus Maw, MD [facility] [place] Office [phone]   Future Orders Complete By Expires   Diet - low sodium heart healthy  As directed    Increase activity slowly  As directed        DISPOSITION home  DIET heart healthy  ACTIVITY: As tolerated    DISCHARGE FOLLOW-UP Follow-up Information   Follow up with Lewayne Bunting, MD On 01/06/2013. (At 2:00 PM)    Specialty:  Cardiology   Contact information:   [address] [place] [address] [phone]       Follow up with Pearson Grippe, MD. Schedule an appointment as soon as possible for a visit in 1 week. [facility] FOLLOW-UP)    Specialty:  Internal Medicine   Contact information:   [address] [place] [address] [phone]       Time spent on Discharge: 35 mins  Signed:   Quinley Nesler M.D. [facility] 12/17/2012, 5:11 PM Pager: [phone]

## 2013-01-06 ENCOUNTER — Encounter: Payer: Self-pay | Admitting: Internal Medicine

## 2013-01-06 ENCOUNTER — Ambulatory Visit (INDEPENDENT_AMBULATORY_CARE_PROVIDER_SITE_OTHER): Payer: PRIVATE HEALTH INSURANCE | Admitting: Internal Medicine

## 2013-01-06 VITALS — BP 128/52 | HR 68 | Ht 66.0 in | Wt 146.0 lb

## 2013-01-06 DIAGNOSIS — I4891 Unspecified atrial fibrillation: Secondary | ICD-10-CM

## 2013-01-06 DIAGNOSIS — I5022 Chronic systolic (congestive) heart failure: Secondary | ICD-10-CM

## 2013-01-06 DIAGNOSIS — I509 Heart failure, unspecified: Secondary | ICD-10-CM

## 2013-01-06 DIAGNOSIS — Z95810 Presence of automatic (implantable) cardiac defibrillator: Secondary | ICD-10-CM

## 2013-01-06 LAB — ICD DEVICE OBSERVATION
ATRIAL PACING ICD: 0 pct
BATTERY VOLTAGE: 2.5418 V
DEVICE MODEL ICD: 442538
FVT: 0
LV LEAD THRESHOLD: 0.75 V
MODE SWITCH EPISODES: 0
PACEART VT: 0
RV LEAD AMPLITUDE: 11.6 mv
RV LEAD IMPEDENCE ICD: 487.5 Ohm
RV LEAD THRESHOLD: 0.5 V
TZAT-0001FASTVT: 1
TZAT-0001SLOWVT: 1
TZAT-0004FASTVT: 8
TZAT-0004SLOWVT: 8
TZAT-0012FASTVT: 200 ms
TZAT-0013FASTVT: 1
TZAT-0018FASTVT: NEGATIVE
TZAT-0019FASTVT: 7.5 V
TZON-0003FASTVT: 300 ms
TZON-0004FASTVT: 30
TZON-0005SLOWVT: 6
TZON-0010FASTVT: 80 ms
TZON-0010SLOWVT: 80 ms
TZST-0001FASTVT: 3
TZST-0001FASTVT: 4
TZST-0001FASTVT: 5
TZST-0001SLOWVT: 2
TZST-0001SLOWVT: 3
TZST-0001SLOWVT: 5
TZST-0003FASTVT: 25 J
TZST-0003FASTVT: 36 J
TZST-0003SLOWVT: 15 J
TZST-0003SLOWVT: 25 J
TZST-0003SLOWVT: 36 J
VENTRICULAR PACING ICD: 92 pct
VF: 0

## 2013-01-06 NOTE — Assessment & Plan Note (Signed)
His symptoms are class 2. I have discussed the importance of maintaining a low sodium diet and not missing his medications. He will continue is current meds.

## 2013-01-06 NOTE — Progress Notes (Signed)
HPI [name] returns today for followup. He is a 79 year old man with an ischemic cardiomyopathy, chronic systolic heart failure, symptomatic bradycardia, status post biventricular ICD implantation. He also has chronic atrial fibrillation. In the interim, the patient has been stable. He denies any recent ICD shock, chest pain, syncope, or peripheral edema. Allergies  Allergen Reactions  . Aspirin Hives     Current Outpatient Prescriptions  Medication Sig Dispense Refill  . albuterol (PROVENTIL) (5 MG/ML) 0.5% nebulizer solution Take 0.5 mLs (2.5 mg total) by nebulization every 6 (six) hours as needed for wheezing or shortness of breath.  20 mL  5  . allopurinol (ZYLOPRIM) 300 MG tablet Take 300 mg by mouth daily.        [name] atorvastatin (LIPITOR) 20 MG tablet Take 20 mg by mouth daily.        [name] CALCIUM-VITAMIN D PO Take 1 tablet by mouth daily.       . Cyanocobalamin (VITAMIN B-12 PO) Take 1 tablet by mouth 2 (two) times daily.       . digoxin (LANOXIN) 0.125 MG tablet Take 125 mcg by mouth daily.        . furosemide (LASIX) 20 MG tablet Take 1 tablet (20 mg total) by mouth daily.  30 tablet  5  . isosorbide mononitrate (IMDUR) 30 MG 24 hr tablet Take 30 mg by mouth daily.        [name] lisinopril (PRINIVIL,ZESTRIL) 10 MG tablet Take 10 mg by mouth every morning.       . loratadine (CLARITIN) 10 MG tablet Take 1 tablet (10 mg total) by mouth daily.  30 tablet  6  . meclizine (ANTIVERT) 12.5 MG tablet Take 1 tablet (12.5 mg total) by mouth 2 (two) times daily as needed for dizziness.  30 tablet  0  . metoprolol succinate (TOPROL-XL) 25 MG 24 hr tablet Take 1 tablet (25 mg total) by mouth daily.      [name] NITROSTAT 0.4 MG SL tablet Place 0.4 mg under the tongue every 5 (five) minutes as needed for chest pain.       [name] omeprazole (PRILOSEC) 20 MG capsule Take 20 mg by mouth every morning.       . potassium chloride SA (K-DUR,KLOR-CON) 20 MEQ tablet Take 20 mEq by mouth daily.      . Rivaroxaban (XARELTO)  15 MG TABS tablet Take 15 mg by mouth daily with supper.       No current facility-administered medications for this visit.     Past Medical History  Diagnosis Date  . Diabetes mellitus   . Hypertension   . Chronic systolic CHF (congestive heart failure)     a. 12/2012 Echo: EF 20-25%, mid-dist antsept AK, mod dil LA.  [name] CAD (coronary artery disease)     a. 07/2002 CABG x 3: LIMA->LAD, VG->Diag, VG->OM;  b. 06/2006 Cath: LM 50-60ost/p, LAD patent mid stent, D1 sev dzs, D2 patent stent, LCX nl, OM2 sev sten prox, RCA large/nl, VG->Diag nl, VG->OM nl, LIMA->LAD atretic, EF 30%.  . Ischemic cardiomyopathy     a. 06/2006 s/p SJM Promote RF, model 3207-36 BiV ICD, ser #: [id number].  [name] Hypercholesterolemia   . Black lung disease   . Nephrolithiasis 09/2000  . CVA (cerebral vascular accident)   . Prostate cancer 11/01/10    gleason 7, 8, 9, gold seeds 02/08/11  . Lung cancer   . COPD (chronic obstructive pulmonary disease)     a. On home O2.  [name]  GERD (gastroesophageal reflux disease)   . Arthritis   . Gout     ROS:   All systems reviewed and negative except as noted in the HPI.   Past Surgical History  Procedure Laterality Date  . Colonoscopy    . Esophagogastroduodenoscopy  02/19/2011    Procedure: ESOPHAGOGASTRODUODENOSCOPY (EGD);  Surgeon: Petra Kuba, MD;  Location: [facility] ENDOSCOPY;  Service: Endoscopy;  Laterality: N/A;  . Upper endoscpopy    . Coronary angioplasty with stent placement  07/1997; 08/1997;03/1998  . Coronary artery bypass graft  07/2002    CABG X3  . Incision and drainage of wound  08/2002    right thigh; S/P EVH  . Insert / replace / remove pacemaker  1979; 1992; 01/2000;  . Insert / replace / remove pacemaker  09/2003; 06/2006    w/AICD  . Insert / replace / remove pacemaker  12/2004    pacmaker explant  . Shoulder arthroscopy w/ rotator cuff repair  05/2008    left     Family History  Problem Relation Age of Onset  . Alzheimer's disease Father 62  . Cancer  Father 73    metastatic prostate cancer  . Diabetes Sister 19  . Diabetes Brother   . Diabetes Brother   . Diabetes Brother   . Hypotension Neg Hx   . Malignant hyperthermia Neg Hx   . Pseudochol deficiency Neg Hx      History   Social History  . Marital Status: Widowed    Spouse Name: N/A    Number of Children: N/A  . Years of Education: N/A   Occupational History  . retired     Medical laboratory scientific officer   Social History Main Topics  . Smoking status: Former Smoker    Types: Cigarettes    Quit date: 03/12/1949  . Smokeless tobacco: Current User    Types: Chew  . Alcohol Use: No     Comment: drank until 7 mos ago, per pt 03/19/11  . Drug Use: No  . Sexual Activity: Yes   Other Topics Concern  . Not on file   Social History Narrative  . No narrative on file     BP 128/52  Pulse 68  Ht 5\' 6"  (1.676 m)  Wt 146 lb (66.225 kg)  BMI 23.58 kg/m2  Physical Exam:  Well appearing elderly man, NAD HEENT: Unremarkable Neck:  7 cm JVD, no thyromegally Lungs:  Clear with no wheezes, rales, or rhonchi. HEART:  IRegular rate rhythm, 2/6 systolic murmur, no rubs, no clicks Abd:  soft, positive bowel sounds, no organomegally, no rebound, no guarding Ext:  2 plus pulses, no edema, no cyanosis, no clubbing Skin:  No rashes no nodules Neuro:  CN II through XII intact, motor grossly intact  DEVICE  Normal device function.  See PaceArt for details.   Assess/Plan:

## 2013-01-06 NOTE — Assessment & Plan Note (Signed)
His BiV ICD is working normally. Will recheck in several months.  

## 2013-01-06 NOTE — Patient Instructions (Signed)
  Your physician wants you to follow-up in: 12 months with Dr Taylor You will receive a reminder letter in the mail two months in advance. If you don't receive a letter, please call our office to schedule the follow-up appointment.   Remote monitoring is used to monitor your Pacemaker or ICD from home. This monitoring reduces the number of office visits required to check your device to one time per year. It allows us to keep an eye on the functioning of your device to ensure it is working properly. You are scheduled for a device check from home on 04/09/13. You may send your transmission at any time that day. If you have a wireless device, the transmission will be sent automatically. After your physician reviews your transmission, you will receive a postcard with your next transmission date.     

## 2013-02-15 ENCOUNTER — Observation Stay (HOSPITAL_COMMUNITY)
Admission: EM | Admit: 2013-02-15 | Discharge: 2013-02-17 | Disposition: A | Discharge status: Home / Self Care | Payer: PRIVATE HEALTH INSURANCE | Attending: Internal Medicine | Admitting: Internal Medicine

## 2013-02-15 ENCOUNTER — Emergency Department (HOSPITAL_COMMUNITY): Payer: PRIVATE HEALTH INSURANCE

## 2013-02-15 ENCOUNTER — Encounter (HOSPITAL_COMMUNITY): Payer: Self-pay | Admitting: Emergency Medicine

## 2013-02-15 ENCOUNTER — Observation Stay (HOSPITAL_COMMUNITY): Payer: PRIVATE HEALTH INSURANCE

## 2013-02-15 DIAGNOSIS — R42 Dizziness and giddiness: Secondary | ICD-10-CM | POA: Insufficient documentation

## 2013-02-15 DIAGNOSIS — R404 Transient alteration of awareness: Secondary | ICD-10-CM | POA: Insufficient documentation

## 2013-02-15 DIAGNOSIS — E119 Type 2 diabetes mellitus without complications: Secondary | ICD-10-CM | POA: Insufficient documentation

## 2013-02-15 DIAGNOSIS — R55 Syncope and collapse: Principal | ICD-10-CM | POA: Diagnosis present

## 2013-02-15 DIAGNOSIS — I5022 Chronic systolic (congestive) heart failure: Secondary | ICD-10-CM | POA: Diagnosis present

## 2013-02-15 DIAGNOSIS — Z8673 Personal history of transient ischemic attack (TIA), and cerebral infarction without residual deficits: Secondary | ICD-10-CM | POA: Insufficient documentation

## 2013-02-15 DIAGNOSIS — I2589 Other forms of chronic ischemic heart disease: Secondary | ICD-10-CM | POA: Insufficient documentation

## 2013-02-15 DIAGNOSIS — I2510 Atherosclerotic heart disease of native coronary artery without angina pectoris: Secondary | ICD-10-CM | POA: Insufficient documentation

## 2013-02-15 DIAGNOSIS — Z85118 Personal history of other malignant neoplasm of bronchus and lung: Secondary | ICD-10-CM | POA: Insufficient documentation

## 2013-02-15 DIAGNOSIS — Z8546 Personal history of malignant neoplasm of prostate: Secondary | ICD-10-CM | POA: Insufficient documentation

## 2013-02-15 DIAGNOSIS — Z9981 Dependence on supplemental oxygen: Secondary | ICD-10-CM

## 2013-02-15 DIAGNOSIS — Z951 Presence of aortocoronary bypass graft: Secondary | ICD-10-CM | POA: Insufficient documentation

## 2013-02-15 DIAGNOSIS — I10 Essential (primary) hypertension: Secondary | ICD-10-CM | POA: Insufficient documentation

## 2013-02-15 DIAGNOSIS — I4891 Unspecified atrial fibrillation: Secondary | ICD-10-CM | POA: Insufficient documentation

## 2013-02-15 DIAGNOSIS — I482 Chronic atrial fibrillation, unspecified: Secondary | ICD-10-CM | POA: Diagnosis present

## 2013-02-15 DIAGNOSIS — J449 Chronic obstructive pulmonary disease, unspecified: Secondary | ICD-10-CM | POA: Insufficient documentation

## 2013-02-15 DIAGNOSIS — I509 Heart failure, unspecified: Secondary | ICD-10-CM | POA: Insufficient documentation

## 2013-02-15 DIAGNOSIS — E7800 Pure hypercholesterolemia, unspecified: Secondary | ICD-10-CM | POA: Insufficient documentation

## 2013-02-15 DIAGNOSIS — K219 Gastro-esophageal reflux disease without esophagitis: Secondary | ICD-10-CM | POA: Insufficient documentation

## 2013-02-15 DIAGNOSIS — J4489 Other specified chronic obstructive pulmonary disease: Secondary | ICD-10-CM | POA: Insufficient documentation

## 2013-02-15 DIAGNOSIS — Z95810 Presence of automatic (implantable) cardiac defibrillator: Secondary | ICD-10-CM | POA: Diagnosis present

## 2013-02-15 DIAGNOSIS — E785 Hyperlipidemia, unspecified: Secondary | ICD-10-CM | POA: Insufficient documentation

## 2013-02-15 DIAGNOSIS — R079 Chest pain, unspecified: Secondary | ICD-10-CM | POA: Insufficient documentation

## 2013-02-15 LAB — CBC
HCT: 30 % — ABNORMAL LOW (ref 39.0–52.0)
Hemoglobin: 9.9 g/dL — ABNORMAL LOW (ref 13.0–17.0)
MCH: 29.4 pg (ref 26.0–34.0)
MCHC: 33 g/dL (ref 30.0–36.0)
MCV: 89 fL (ref 78.0–100.0)
RBC: 3.37 MIL/uL — ABNORMAL LOW (ref 4.22–5.81)
WBC: 4.4 10*3/uL (ref 4.0–10.5)

## 2013-02-15 LAB — BASIC METABOLIC PANEL
BUN: 25 mg/dL — ABNORMAL HIGH (ref 6–23)
CO2: 27 mEq/L (ref 19–32)
Calcium: 9.8 mg/dL (ref 8.4–10.5)
Chloride: 98 mEq/L (ref 96–112)
Glucose, Bld: 150 mg/dL — ABNORMAL HIGH (ref 70–99)
Potassium: 4.3 mEq/L (ref 3.5–5.1)
Sodium: 137 mEq/L (ref 135–145)

## 2013-02-15 LAB — POCT I-STAT TROPONIN I

## 2013-02-15 MED ORDER — ACETAMINOPHEN 325 MG PO TABS: 650.0000 mg | ORAL_TABLET | Freq: Four times a day (QID) | ORAL | Status: DC | PRN

## 2013-02-15 MED ORDER — ALBUTEROL SULFATE (5 MG/ML) 0.5% IN NEBU: 2.5000 mg | INHALATION_SOLUTION | Freq: Four times a day (QID) | RESPIRATORY_TRACT | Status: DC | PRN

## 2013-02-15 MED ORDER — ACETAMINOPHEN 650 MG RE SUPP: 650.0000 mg | Freq: Four times a day (QID) | RECTAL | Status: DC | PRN

## 2013-02-15 MED ORDER — PANTOPRAZOLE SODIUM 40 MG PO TBEC
40.0000 mg | DELAYED_RELEASE_TABLET | Freq: Every day | ORAL | Status: DC
  Administered 2013-02-15 – 2013-02-17 (×3): 40 mg via ORAL
  Filled 2013-02-15 (×3): qty 1

## 2013-02-15 MED ORDER — ALLOPURINOL 300 MG PO TABS
300.0000 mg | ORAL_TABLET | Freq: Every day | ORAL | Status: DC
  Administered 2013-02-15 – 2013-02-17 (×3): 300 mg via ORAL
  Filled 2013-02-15 (×3): qty 1

## 2013-02-15 MED ORDER — RIVAROXABAN 15 MG PO TABS
15.0000 mg | ORAL_TABLET | Freq: Every day | ORAL | Status: DC
  Administered 2013-02-15 – 2013-02-17 (×3): 15 mg via ORAL
  Filled 2013-02-15 (×3): qty 1

## 2013-02-15 MED ORDER — ISOSORBIDE MONONITRATE ER 30 MG PO TB24
30.0000 mg | ORAL_TABLET | Freq: Every day | ORAL | Status: DC
  Administered 2013-02-16: 30 mg via ORAL
  Filled 2013-02-15: qty 1

## 2013-02-15 MED ORDER — MECLIZINE HCL 12.5 MG PO TABS
12.5000 mg | ORAL_TABLET | Freq: Two times a day (BID) | ORAL | Status: DC | PRN
  Filled 2013-02-15: qty 1

## 2013-02-15 MED ORDER — SODIUM CHLORIDE 0.9 % IJ SOLN
3.0000 mL | Freq: Two times a day (BID) | INTRAMUSCULAR | Status: DC
  Administered 2013-02-15 – 2013-02-17 (×4): 3 mL via INTRAVENOUS

## 2013-02-15 MED ORDER — LISINOPRIL 10 MG PO TABS
10.0000 mg | ORAL_TABLET | ORAL | Status: DC
  Administered 2013-02-16: 10 mg via ORAL
  Filled 2013-02-15 (×2): qty 1

## 2013-02-15 MED ORDER — SODIUM CHLORIDE 0.9 % IV SOLN: INTRAVENOUS | Status: DC

## 2013-02-15 MED ORDER — ONDANSETRON HCL 4 MG/2ML IJ SOLN: 4.0000 mg | Freq: Three times a day (TID) | INTRAMUSCULAR | Status: DC | PRN

## 2013-02-15 MED ORDER — DIGOXIN 125 MCG PO TABS
125.0000 ug | ORAL_TABLET | Freq: Every day | ORAL | Status: DC
  Administered 2013-02-15 – 2013-02-17 (×3): 125 ug via ORAL
  Filled 2013-02-15 (×3): qty 1

## 2013-02-15 MED ORDER — ATORVASTATIN CALCIUM 20 MG PO TABS
20.0000 mg | ORAL_TABLET | Freq: Every day | ORAL | Status: DC
  Administered 2013-02-15 – 2013-02-17 (×4): 20 mg via ORAL
  Filled 2013-02-15 (×3): qty 1

## 2013-02-15 NOTE — ED Notes (Signed)
MD at bedside. 

## 2013-02-15 NOTE — ED Notes (Signed)
Charge RN made aware of need to interrogate pacemaker/defib device.

## 2013-02-15 NOTE — ED Provider Notes (Signed)
CSN: [id number]     Arrival date & time 02/15/13  1503 History   First MD Initiated Contact with Patient 02/15/13 1545     Chief Complaint  Patient presents with  . Loss of Consciousness   (Consider location/radiation/quality/duration/timing/severity/associated sxs/prior Treatment) HPI Comments: 79 year old male with a history of congestive heart failure, hypertension, hypercholesterolemia and a pacemaker which she has had since the late 1970s. This has been replaced several times, he states that it is due to be replaced in the next year. Over the last 24 hours he reports 2 discreet episodes of syncope. The first occurred yesterday when he was walking out to his mailbox, got his mail, turned around and felt lightheaded, nauseated and then syncopized. This was apparently witnessed by a family member who saw him looses consciousness and fall to the ground. He stayed in the bed the rest of the evening, ate, tolerated fluids and symptoms had improved significantly. Today he was able to eat breakfast, he walked into the kitchen later in the day when he had a recurrent syncopal that occurred when he became lightheaded and felt as though he was going to pass out again. At this time the patient states that he has a left-sided chest pain which seems to come and go, this is appears to be a chronic problem, occurs frequently at night when he is lying down and when he tries to roll over in the bed but not an exertional pain. He has no shortness of breath out of the usual for him, he does have an oxygen requirement at home. He denies headache, blurred vision, numbness weakness or difficulty with speech, vision, coordination.  Patient is a 79 y.o. male presenting with syncope. The history is provided by the patient and medical records.  Loss of Consciousness   Past Medical History  Diagnosis Date  . Diabetes mellitus   . Hypertension   . Chronic systolic CHF (congestive heart failure)     a. 12/2012 Echo: EF  20-25%, mid-dist antsept AK, mod dil LA.  [name] CAD (coronary artery disease)     a. 07/2002 CABG x 3: LIMA->LAD, VG->Diag, VG->OM;  b. 06/2006 Cath: LM 50-60ost/p, LAD patent mid stent, D1 sev dzs, D2 patent stent, LCX nl, OM2 sev sten prox, RCA large/nl, VG->Diag nl, VG->OM nl, LIMA->LAD atretic, EF 30%.  . Ischemic cardiomyopathy     a. 06/2006 s/p SJM Promote RF, model 3207-36 BiV ICD, ser #: [id number].  [name] Hypercholesterolemia   . Black lung disease   . Nephrolithiasis 09/2000  . CVA (cerebral vascular accident)   . Prostate cancer 11/01/10    gleason 7, 8, 9, gold seeds 02/08/11  . Lung cancer   . COPD (chronic obstructive pulmonary disease)     a. On home O2.  [name] GERD (gastroesophageal reflux disease)   . Arthritis   . Gout    Past Surgical History  Procedure Laterality Date  . Colonoscopy    . Esophagogastroduodenoscopy  02/19/2011    Procedure: ESOPHAGOGASTRODUODENOSCOPY (EGD);  Surgeon: Petra Kuba, MD;  Location: [facility] ENDOSCOPY;  Service: Endoscopy;  Laterality: N/A;  . Upper endoscpopy    . Coronary angioplasty with stent placement  07/1997; 08/1997;03/1998  . Coronary artery bypass graft  07/2002    CABG X3  . Incision and drainage of wound  08/2002    right thigh; S/P EVH  . Insert / replace / remove pacemaker  1979; 1992; 01/2000;  . Insert / replace / remove pacemaker  09/2003; 06/2006  w/AICD  . Insert / replace / remove pacemaker  12/2004    pacmaker explant  . Shoulder arthroscopy w/ rotator cuff repair  05/2008    left   Family History  Problem Relation Age of Onset  . Alzheimer's disease Father 51  . Cancer Father 78    metastatic prostate cancer  . Diabetes Sister 92  . Diabetes Brother   . Diabetes Brother   . Diabetes Brother   . Hypotension Neg Hx   . Malignant hyperthermia Neg Hx   . Pseudochol deficiency Neg Hx    History  Substance Use Topics  . Smoking status: Former Smoker    Types: Cigarettes    Quit date: 03/12/1949  . Smokeless tobacco: Current User     Types: Chew  . Alcohol Use: No     Comment: drank until 7 mos ago, per pt 03/19/11    Review of Systems  Cardiovascular: Positive for syncope.  All other systems reviewed and are negative.    Allergies  Aspirin  Home Medications   Current Outpatient Rx  Name  Route  Sig  Dispense  Refill  . albuterol (PROVENTIL) (5 MG/ML) 0.5% nebulizer solution   Nebulization   Take 0.5 mLs (2.5 mg total) by nebulization every 6 (six) hours as needed for wheezing or shortness of breath.   20 mL   5   . allopurinol (ZYLOPRIM) 300 MG tablet   Oral   Take 300 mg by mouth daily.           [name] atorvastatin (LIPITOR) 20 MG tablet   Oral   Take 20 mg by mouth daily.           [name] CALCIUM-VITAMIN D PO   Oral   Take 1 tablet by mouth daily.          . Cyanocobalamin (VITAMIN B-12 PO)   Oral   Take 1 tablet by mouth 2 (two) times daily.          . digoxin (LANOXIN) 0.125 MG tablet   Oral   Take 125 mcg by mouth daily.           . furosemide (LASIX) 20 MG tablet   Oral   Take 1 tablet (20 mg total) by mouth daily.   30 tablet   5   . isosorbide mononitrate (IMDUR) 30 MG 24 hr tablet   Oral   Take 30 mg by mouth daily.           [name] lisinopril (PRINIVIL,ZESTRIL) 10 MG tablet   Oral   Take 10 mg by mouth every morning.          . loratadine (CLARITIN) 10 MG tablet   Oral   Take 1 tablet (10 mg total) by mouth daily.   30 tablet   6   . meclizine (ANTIVERT) 12.5 MG tablet   Oral   Take 1 tablet (12.5 mg total) by mouth 2 (two) times daily as needed for dizziness.   30 tablet   0   . metoprolol succinate (TOPROL-XL) 25 MG 24 hr tablet   Oral   Take 1 tablet (25 mg total) by mouth daily.         [name] NITROSTAT 0.4 MG SL tablet   Sublingual   Place 0.4 mg under the tongue every 5 (five) minutes as needed for chest pain.          [name] omeprazole (PRILOSEC) 20 MG capsule   Oral   Take 20  mg by mouth every morning.          . potassium chloride SA  (K-DUR,KLOR-CON) 20 MEQ tablet   Oral   Take 20 mEq by mouth daily.         . Rivaroxaban (XARELTO) 15 MG TABS tablet   Oral   Take 15 mg by mouth daily with supper.          BP 109/49  Pulse 64  Temp(Src) 97.8 F (36.6 C) (Oral)  Resp 22  Ht 5\' 6"  (1.676 m)  Wt 145 lb (65.772 kg)  BMI 23.41 kg/m2  SpO2 100% Physical Exam  Nursing note and vitals reviewed. Constitutional: He appears well-developed and well-nourished. No distress.  HENT:  Head: Normocephalic and atraumatic.  Mouth/Throat: Oropharynx is clear and moist. No oropharyngeal exudate.  Eyes: Conjunctivae and EOM are normal. Pupils are equal, round, and reactive to light. Right eye exhibits no discharge. Left eye exhibits no discharge. No scleral icterus.  Neck: Normal range of motion. Neck supple. No JVD present. No thyromegaly present.  Cardiovascular: Normal rate, regular rhythm, normal heart sounds and intact distal pulses.  Exam reveals no gallop and no friction rub.   No murmur heard. Pulmonary/Chest: Effort normal and breath sounds normal. No respiratory distress. He has no wheezes. He has no rales. He exhibits no tenderness.  No reproducible tenderness to palpation over the left chest wall  Abdominal: Soft. Bowel sounds are normal. He exhibits no distension and no mass. There is no tenderness.  Musculoskeletal: Normal range of motion. He exhibits no edema and no tenderness.  No peripheral edema, asymmetry or tenderness, soft joints and compartments  Lymphadenopathy:    He has no cervical adenopathy.  Neurological: He is alert. Coordination normal.  Middle status is normal, cranial nerves III through XII intact, moves all extremities x4 without difficulty, normal coordination and speech  Skin: Skin is warm and dry. No rash noted. No erythema.  No abrasions, bruising  Psychiatric: He has a normal mood and affect. His behavior is normal.    ED Course  Procedures (including critical care time) Labs  Review Labs Reviewed  BASIC METABOLIC PANEL - Abnormal; Notable for the following:    Glucose, Bld 150 (*)    BUN 25 (*)    GFR calc non Af Amer 55 (*)    GFR calc Af Amer 64 (*)    All other components within normal limits  CBC - Abnormal; Notable for the following:    RBC 3.37 (*)    Hemoglobin 9.9 (*)    HCT 30.0 (*)    RDW 15.9 (*)    All other components within normal limits  DIGOXIN LEVEL  POCT I-STAT TROPONIN I   Imaging Review Dg Chest 2 View  02/15/2013   CLINICAL DATA:  Larey Seat.  Chest pain.  EXAM: CHEST  2 VIEW  COMPARISON:  12/16/2012.  FINDINGS: The pacer wires/ AICD are stable. The heart is enlarged but unchanged. The mediastinal and hilar contours are within normal limits and stable. Stable surgical changes related to bypass surgery. There are chronic bronchitic type interstitial lung changes and interstitial scarring. No infiltrates, edema or effusions. Mild chronic pleural scarring changes on the left and associated calcifications.  IMPRESSION: Chronic lung changes and mild cardiac enlargement but no definite acute overlying pulmonary process.   Electronically Signed   By: Loralie Champagne M.D.   On: 02/15/2013 17:23   Dg Pelvis 1-2 Views  02/15/2013   CLINICAL DATA:  Larey Seat.  Left buttock pain.  EXAM: PELVIS - 1-2 VIEW  COMPARISON:  None.  FINDINGS: Both hips are normally located. No acute hip fracture. The pubic symphysis and SI joints are intact. No pelvic fractures.  IMPRESSION: No acute bony findings.   Electronically Signed   By: Loralie Champagne M.D.   On: 02/15/2013 17:26    EKG Interpretation    Date/Time:  Sunday February 15 2013 15:09:54 EST Ventricular Rate:  62 PR Interval:    QRS Duration: 172 QT Interval:  454 QTC Calculation: 460 R Axis:   -88 Text Interpretation:  VENTRICULAR PACED RHYTHM Ventricular-paced rhythm with frequent Premature ventricular complexes Abnormal ECG Since last tracing Premature ventricular complexes now present Reconfirmed by Auda Finfrock   MD, Jillianne Gamino ([id number]) on 02/15/2013 4:20:10 PM            MDM   1. Syncope    Recurrent syncopal event, we'll need a pacemaker interrogation, the patient has not felt any defibrillatory event, his vital signs appear normal at this time, he is afebrile, his blood pressure is normal, we'll check a urinalysis for hydration, labs appear normal , his hemoglobin is 10 which appears to be baseline since his last check in the last 2 months, electrolytes are normal, renal function is normal and initial troponin is normal. EKG shows a paced rhythm with occasional PVCs.  Pacer interrogated, no acute events, digoxin level is normal, vital signs remained in a normal range and with orthostatic vital sign, no significant change with sitting or standing. Discussed with hospitalist who will admit. Telemetry observation.  Vida Roller, MD 02/15/13 2002

## 2013-02-15 NOTE — ED Notes (Signed)
Ordered patient heart healthy meal tray per Dr. Hyacinth Meeker.

## 2013-02-15 NOTE — Progress Notes (Signed)
ANTICOAGULATION CONSULT NOTE - Initial Consult  Pharmacy Consult for Xarelto Indication: atrial fibrillation  Allergies  Allergen Reactions  . Aspirin Hives    Patient Measurements: Height: 5\' 6"  (167.6 cm) Weight: 145 lb (65.772 kg) IBW/kg (Calculated) : 63.8  Vital Signs: Temp: 97.9 F (36.6 C) (12/07 2005) Temp src: Oral (12/07 2005) BP: 117/47 mmHg (12/07 2005) Pulse Rate: 70 (12/07 2005)  Labs:  Recent Labs  02/15/13 1526  HGB 9.9*  HCT 30.0*  PLT 178  CREATININE 1.22    Estimated Creatinine Clearance: 45 ml/min (by C-G formula based on Cr of 1.22).   Medical History: Past Medical History  Diagnosis Date  . Diabetes mellitus   . Hypertension   . Chronic systolic CHF (congestive heart failure)     a. 12/2012 Echo: EF 20-25%, mid-dist antsept AK, mod dil LA.  [name] CAD (coronary artery disease)     a. 07/2002 CABG x 3: LIMA->LAD, VG->Diag, VG->OM;  b. 06/2006 Cath: LM 50-60ost/p, LAD patent mid stent, D1 sev dzs, D2 patent stent, LCX nl, OM2 sev sten prox, RCA large/nl, VG->Diag nl, VG->OM nl, LIMA->LAD atretic, EF 30%.  . Ischemic cardiomyopathy     a. 06/2006 s/p SJM Promote RF, model 3207-36 BiV ICD, ser #: [id number].  [name] Hypercholesterolemia   . Black lung disease   . Nephrolithiasis 09/2000  . CVA (cerebral vascular accident)   . Prostate cancer 11/01/10    gleason 7, 8, 9, gold seeds 02/08/11  . Lung cancer   . COPD (chronic obstructive pulmonary disease)     a. On home O2.  [name] GERD (gastroesophageal reflux disease)   . Arthritis   . Gout     Assessment: 26 YOM with hx of afib has a pacemaker, and on xarelto PTA. He is admitted for loss of consciousness, likely will need pacemaker interrogation, pharmacy is consulted to resume xarelto. Pt. Takes 15 mg daily, last dose was on 12/6. Scr 1.22, est. crcl ~ 45 ml/min, hgb 9.9, plt 178 K  Goal of Therapy:  Appropriate xarelto dosing Monitor platelets by anticoagulation protocol: Yes   Plan:  - Continue  Xarelto 15 mg po daily with evening meal - f/u renal function and cbc  Bayard Hugger, PharmD, BCPS  Clinical Pharmacist  Pager: [phone]   02/15/2013,9:30 PM

## 2013-02-15 NOTE — H&P (Signed)
[facility] History and Physical  Patient: [name]  ZOX:[id number]  DOB: [birth year]  DOS: the patient was seen and examined on 02/15/2013 PCP: Pearson Grippe, MD  Chief Complaint: Syncope and a fall  HPI: [name] is a 79 y.o. male with Past medical history of diabetes mellitus, hypertension, ischemic cardiomyopathy status post ICD, coronary artery disease status post CABG, dyslipidemia, CVA, prostate cancer. The patient is coming from home. The patient presented with the complaints of a fall. Yesterday when he went out of his house and walk to the mailbox when he returned back he felt lightheaded nauseated and then passed out. He was on the ground and the Nexium that he remembers is his family members were trying to wake him up. He did not have any incontinence of bowel or bladder he denies any trauma. The down time is unclear. Throughout the night the patient did not have any complaints or focal neurological deficit. Later in the morning when he was walking in the kitchen he had another episode of lightheadedness and felt he was going to pass out.  He mentions that he has baseline orthostasis and has to wait for a while until he feels steady on his feet after changing position. He denies any chest pain, fever, chills, , diarrhea, constipation He had 1 episodes of nausea, vomiting yesterday. Currently he denies any focal neurological deficit. He mentions he is compliant with all his medications.  Review of Systems: as mentioned in the history of present illness.  A Comprehensive review of the other systems is negative.  Past Medical History  Diagnosis Date  . Diabetes mellitus   . Hypertension   . Chronic systolic CHF (congestive heart failure)     a. 12/2012 Echo: EF 20-25%, mid-dist antsept AK, mod dil LA.  [name] CAD (coronary artery disease)     a. 07/2002 CABG x 3: LIMA->LAD, VG->Diag, VG->OM;  b. 06/2006 Cath: LM 50-60ost/p, LAD patent mid stent, D1 sev dzs, D2 patent stent,  LCX nl, OM2 sev sten prox, RCA large/nl, VG->Diag nl, VG->OM nl, LIMA->LAD atretic, EF 30%.  . Ischemic cardiomyopathy     a. 06/2006 s/p SJM Promote RF, model 3207-36 BiV ICD, ser #: [id number].  [name] Hypercholesterolemia   . Black lung disease   . Nephrolithiasis 09/2000  . CVA (cerebral vascular accident)   . Prostate cancer 11/01/10    gleason 7, 8, 9, gold seeds 02/08/11  . Lung cancer   . COPD (chronic obstructive pulmonary disease)     a. On home O2.  [name] GERD (gastroesophageal reflux disease)   . Arthritis   . Gout    Past Surgical History  Procedure Laterality Date  . Colonoscopy    . Esophagogastroduodenoscopy  02/19/2011    Procedure: ESOPHAGOGASTRODUODENOSCOPY (EGD);  Surgeon: Petra Kuba, MD;  Location: [facility] ENDOSCOPY;  Service: Endoscopy;  Laterality: N/A;  . Upper endoscpopy    . Coronary angioplasty with stent placement  07/1997; 08/1997;03/1998  . Coronary artery bypass graft  07/2002    CABG X3  . Incision and drainage of wound  08/2002    right thigh; S/P EVH  . Insert / replace / remove pacemaker  1979; 1992; 01/2000;  . Insert / replace / remove pacemaker  09/2003; 06/2006    w/AICD  . Insert / replace / remove pacemaker  12/2004    pacmaker explant  . Shoulder arthroscopy w/ rotator cuff repair  05/2008    left   Social History:  reports that  he quit smoking about 63 years ago. His smoking use included Cigarettes. He smoked 0.00 packs per day. His smokeless tobacco use includes Chew. He reports that he does not drink alcohol or use illicit drugs. Independent for most of his  ADL.  Allergies  Allergen Reactions  . Aspirin Hives    Family History  Problem Relation Age of Onset  . Alzheimer's disease Father 65  . Cancer Father 54    metastatic prostate cancer  . Diabetes Sister 16  . Diabetes Brother   . Diabetes Brother   . Diabetes Brother   . Hypotension Neg Hx   . Malignant hyperthermia Neg Hx   . Pseudochol deficiency Neg Hx     Prior to Admission  medications   Medication Sig Start Date End Date Taking? Authorizing Provider  albuterol (PROVENTIL) (5 MG/ML) 0.5% nebulizer solution Take 0.5 mLs (2.5 mg total) by nebulization every 6 (six) hours as needed for wheezing or shortness of breath. 07/03/12  Yes Pearson Grippe, MD  allopurinol (ZYLOPRIM) 300 MG tablet Take 300 mg by mouth daily.     Yes Historical Provider, MD  atorvastatin (LIPITOR) 20 MG tablet Take 20 mg by mouth daily.     Yes Historical Provider, MD  CALCIUM-VITAMIN D PO Take 1 tablet by mouth daily.    Yes Historical Provider, MD  Cyanocobalamin (VITAMIN B-12 PO) Take 1 tablet by mouth 2 (two) times daily.    Yes Historical Provider, MD  digoxin (LANOXIN) 0.125 MG tablet Take 125 mcg by mouth daily.     Yes Historical Provider, MD  furosemide (LASIX) 20 MG tablet Take 1 tablet (20 mg total) by mouth daily. 12/18/12  Yes Ripudeep Jenna Luo, MD  isosorbide mononitrate (IMDUR) 30 MG 24 hr tablet Take 30 mg by mouth daily.     Yes Historical Provider, MD  lisinopril (PRINIVIL,ZESTRIL) 10 MG tablet Take 10 mg by mouth every morning.    Yes Historical Provider, MD  loratadine (CLARITIN) 10 MG tablet Take 1 tablet (10 mg total) by mouth daily. 07/03/12  Yes Pearson Grippe, MD  meclizine (ANTIVERT) 12.5 MG tablet Take 1 tablet (12.5 mg total) by mouth 2 (two) times daily as needed for dizziness. 12/17/12  Yes Ripudeep Jenna Luo, MD  metoprolol succinate (TOPROL-XL) 25 MG 24 hr tablet Take 1 tablet (25 mg total) by mouth daily. 12/18/12  Yes Ripudeep K Rai, MD  NITROSTAT 0.4 MG SL tablet Place 0.4 mg under the tongue every 5 (five) minutes as needed for chest pain.  10/17/11  Yes Historical Provider, MD  omeprazole (PRILOSEC) 20 MG capsule Take 20 mg by mouth every morning.    Yes Historical Provider, MD  potassium chloride SA (K-DUR,KLOR-CON) 20 MEQ tablet Take 20 mEq by mouth daily.   Yes Historical Provider, MD  Rivaroxaban (XARELTO) 15 MG TABS tablet Take 15 mg by mouth daily with supper.   Yes Historical  Provider, MD    Physical Exam: Filed Vitals:   02/15/13 1956 02/15/13 1958 02/15/13 2000 02/15/13 2005  BP: 109/49 102/53 99/53 117/47  Pulse: 64 76 64 70  Temp:    97.9 F (36.6 C)  TempSrc:    Oral  Resp:    25  Height:      Weight:      SpO2:    100%    General: Alert, Awake and Oriented to Time, Place and Person. Appear in mild distress Eyes: PERRL ENT: Oral Mucosa clear moist Neck: no JVD Cardiovascular: S1 and S2  Present, aortic systolic Murmur, Peripheral Pulses Present Respiratory: Bilateral Air entry equal and Decreased, Clear to Auscultation,  no Crackles,no wheezes Abdomen: Bowel Sound Present, Soft and Non tender Skin: no Rash Extremities: no Pedal edema, no calf tenderness Neurologic: Grossly Unremarkable.  Labs on Admission:  CBC:  Recent Labs Lab 02/15/13 1526  WBC 4.4  HGB 9.9*  HCT 30.0*  MCV 89.0  PLT 178    CMP     Component Value Date/Time   NA 137 02/15/2013 1526   K 4.3 02/15/2013 1526   CL 98 02/15/2013 1526   CO2 27 02/15/2013 1526   GLUCOSE 150* 02/15/2013 1526   BUN 25* 02/15/2013 1526   CREATININE 1.22 02/15/2013 1526   CALCIUM 9.8 02/15/2013 1526   PROT 7.1 07/01/2012 0445   ALBUMIN 3.0* 07/01/2012 0445   AST 21 07/01/2012 0445   ALT 22 07/01/2012 0445   ALKPHOS 85 07/01/2012 0445   BILITOT 0.7 07/01/2012 0445   GFRNONAA 55* 02/15/2013 1526   GFRAA 64* 02/15/2013 1526    No results found for this basename: LIPASE, AMYLASE,  in the last 168 hours No results found for this basename: AMMONIA,  in the last 168 hours  No results found for this basename: CKTOTAL, CKMB, CKMBINDEX, TROPONINI,  in the last 168 hours BNP (last 3 results)  Recent Labs  07/02/12 0600 07/03/12 0842 12/16/12 0910  PROBNP 1582.0* 1294.0* 2381.0*    Radiological Exams on Admission: Dg Chest 2 View  02/15/2013   CLINICAL DATA:  Larey Seat.  Chest pain.  EXAM: CHEST  2 VIEW  COMPARISON:  12/16/2012.  FINDINGS: The pacer wires/ AICD are stable. The heart is enlarged  but unchanged. The mediastinal and hilar contours are within normal limits and stable. Stable surgical changes related to bypass surgery. There are chronic bronchitic type interstitial lung changes and interstitial scarring. No infiltrates, edema or effusions. Mild chronic pleural scarring changes on the left and associated calcifications.  IMPRESSION: Chronic lung changes and mild cardiac enlargement but no definite acute overlying pulmonary process.   Electronically Signed   By: Loralie Champagne M.D.   On: 02/15/2013 17:23   Dg Pelvis 1-2 Views  02/15/2013   CLINICAL DATA:  Larey Seat.  Left buttock pain.  EXAM: PELVIS - 1-2 VIEW  COMPARISON:  None.  FINDINGS: Both hips are normally located. No acute hip fracture. The pubic symphysis and SI joints are intact. No pelvic fractures.  IMPRESSION: No acute bony findings.   Electronically Signed   By: Loralie Champagne M.D.   On: 02/15/2013 17:26    EKG: Independently reviewd. paced rhythm As per the ED physician pacemaker interrogation done in the emergency room did not show any abnormality.  Assessment/Plan Principal Problem:   Syncope Active Problems:   ICD-[employer]   S/P CABG (coronary artery bypass graft)   On home oxygen therapy   1. Syncope The patient is presenting with syncope, 2 episodes. He currently does not have any focal neurological deficit. His EKG shows a paced rhythm with occasional PVC, His initial laboratory does not show any significant abnormality, other than chronic anemia. His digoxin level is also within normal limits His chest x-ray and x-ray of pelvis does not show any fracture. His pacemaker interrogation also did not show any arrhythmia event. With this information the patient will be admitted for observation, serial neuro checks will be done. Orthostatic vitals will be done. I would hold off Lopressor at present. I will get an echocardiogram in the morning. Considering him  being on Xarelto and had a fall, I will get a CT of  the head to rule out any intracranial bleeding. I would also continue him on meclizine  2. Atrial fibrillation rate controlled, paced rhythm Continue Xarelto, digoxin  3. COPD Currently stable continue oxygen as needed  4. Anemia Patient denies any active bleeding or melena At present continue as outlined monitor H&H every 12 hours  DVT Prophylaxis: mechanical compression device Nutrition: Cardiac diet  Code Status: Full  Disposition: Admitted to observation in telemetry  unit.  Author: Lynden Oxford, MD [facility] Pager: [phone] 02/15/2013, 9:00 PM    If 7PM-7AM, please contact night-coverage [email or web address] Password [email or web address]

## 2013-02-15 NOTE — ED Notes (Signed)
Family reports pt has passed out x 2 since yesterday. States witnesses saw him "get shaky, lose consciousness and fall onto ground" both times. Pt states his L hip hurts from the falls. Pt is A&Ox4 now.

## 2013-02-15 NOTE — ED Notes (Signed)
Pt device is actually [employer]. Was interrogated and waiting for results.

## 2013-02-16 DIAGNOSIS — I951 Orthostatic hypotension: Secondary | ICD-10-CM

## 2013-02-16 DIAGNOSIS — I4891 Unspecified atrial fibrillation: Secondary | ICD-10-CM

## 2013-02-16 DIAGNOSIS — R55 Syncope and collapse: Principal | ICD-10-CM

## 2013-02-16 LAB — COMPREHENSIVE METABOLIC PANEL
ALT: 19 U/L (ref 0–53)
Alkaline Phosphatase: 88 U/L (ref 39–117)
BUN: 22 mg/dL (ref 6–23)
Chloride: 99 mEq/L (ref 96–112)
Creatinine, Ser: 1.12 mg/dL (ref 0.50–1.35)
GFR calc Af Amer: 71 mL/min — ABNORMAL LOW (ref >90)
Glucose, Bld: 100 mg/dL — ABNORMAL HIGH (ref 70–99)
Potassium: 4.2 mEq/L (ref 3.5–5.1)
Total Bilirubin: 0.5 mg/dL (ref 0.3–1.2)
Total Protein: 7.2 g/dL (ref 6.0–8.3)

## 2013-02-16 LAB — CBC
HCT: 28.4 % — ABNORMAL LOW (ref 39.0–52.0)
Hemoglobin: 9.4 g/dL — ABNORMAL LOW (ref 13.0–17.0)
MCHC: 33.1 g/dL (ref 30.0–36.0)
MCV: 88.8 fL (ref 78.0–100.0)
RBC: 3.2 MIL/uL — ABNORMAL LOW (ref 4.22–5.81)
RDW: 16 % — ABNORMAL HIGH (ref 11.5–15.5)

## 2013-02-16 LAB — DIGOXIN LEVEL: Digoxin Level: 1.5 ng/mL (ref 0.8–2.0)

## 2013-02-16 LAB — MRSA PCR SCREENING: MRSA by PCR: NEGATIVE

## 2013-02-16 MED ORDER — LISINOPRIL 5 MG PO TABS
5.0000 mg | ORAL_TABLET | Freq: Every morning | ORAL | Status: DC
  Administered 2013-02-17: 09:00:00 5 mg via ORAL
  Filled 2013-02-16: qty 1

## 2013-02-16 MED ORDER — METOPROLOL TARTRATE 25 MG PO TABS
25.0000 mg | ORAL_TABLET | Freq: Two times a day (BID) | ORAL | Status: DC
  Administered 2013-02-16 – 2013-02-17 (×2): 25 mg via ORAL
  Filled 2013-02-16 (×4): qty 1

## 2013-02-16 MED ORDER — MAGNESIUM SULFATE IN D5W 10-5 MG/ML-% IV SOLN
1.0000 g | Freq: Once | INTRAVENOUS | Status: AC
  Administered 2013-02-16: 08:00:00 1 g via INTRAVENOUS
  Filled 2013-02-16: qty 100

## 2013-02-16 NOTE — Evaluation (Signed)
Physical Therapy Evaluation Patient Details Name: [name] MRN: [id number] DOB: 1933-01-23 Today's Date: 02/16/2013 Time: 1520-1540 PT Time Calculation (min): 20 min  PT Assessment / Plan / Recommendation History of Present Illness  Pt admit for syncope.  Clinical Impression  Pt admitted with above. Pt currently with functional limitations due to the deficits listed below (see PT Problem List). Pt will benefit from skilled PT to increase their independence and safety with mobility to allow discharge to the venue listed below.     PT Assessment  Patient needs continued PT services    Follow Up Recommendations  SNF;Supervision/Assistance - 24 hour        Barriers to Discharge Decreased caregiver support      Equipment Recommendations  None recommended by PT         Frequency Min 3X/week    Precautions / Restrictions Precautions Precautions: Fall Restrictions Weight Bearing Restrictions: No   Pertinent Vitals/Pain Orthostatic BPs  Supine 125/61, 76 bpm  Sitting 111/65, 76 bpm  Standing 65/17, 70 bpm  Standing after 3 min Did not take due to drop as above   BP at rest once in bed was 124/59 in supine.   O2 at 86 % on RA and >90% with 2LO2.       Mobility  Bed Mobility Bed Mobility: Rolling Left;Left Sidelying to Sit Rolling Left: 4: Min guard Left Sidelying to Sit: 4: Min guard Details for Bed Mobility Assistance: cues for safety. Transfers Transfers: Sit to Stand;Stand to Sit Sit to Stand: 4: Min guard Stand to Sit: 4: Min guard Details for Transfer Assistance: cues for hand placement Ambulation/Gait Ambulation/Gait Assistance: 4: Min assist Ambulation Distance (Feet): 45 Feet Assistive device: None Ambulation/Gait Assistance Details: Pt ambulated with steadying assist with occasional stagger. Pt c/o dizziness at end of walk. checked orthostatics.  See above.   Gait Pattern: Step-through pattern;Decreased stride length Gait velocity: decreased Stairs:  No Wheelchair Mobility Wheelchair Mobility: No         PT Diagnosis: Difficulty walking  PT Problem List: Decreased activity tolerance;Decreased balance;Decreased mobility;Decreased knowledge of use of DME;Decreased safety awareness;Decreased knowledge of precautions (dizziness) PT Treatment Interventions: DME instruction;Gait training;Functional mobility training;Therapeutic activities;Therapeutic exercise;Balance training;Patient/family education     PT Goals(Current goals can be found in the care plan section) Acute Rehab PT Goals Patient Stated Goal: to go home PT Goal Formulation: With patient Time For Goal Achievement: 02/23/13 Potential to Achieve Goals: Good  Visit Information  Last PT Received On: 02/16/13 Assistance Needed: +1 History of Present Illness: Pt admit for syncope.       Prior Functioning  Home Living Family/patient expects to be discharged to:: Private residence Living Arrangements: Children Available Help at Discharge: Family;Available PRN/intermittently Type of Home: House Home Access: Level entry Home Layout: One level Home Equipment: Bedside commode;Walker - 2 wheels;Cane - single point;Shower seat (home O2), hospital bed. Prior Function Level of Independence: Independent Communication Communication: No difficulties Dominant Hand: Right    Cognition  Cognition Arousal/Alertness: Awake/alert Behavior During Therapy: WFL for tasks assessed/performed Overall Cognitive Status: Within Functional Limits for tasks assessed    Extremity/Trunk Assessment Upper Extremity Assessment Upper Extremity Assessment: Defer to OT evaluation Lower Extremity Assessment Lower Extremity Assessment: Generalized weakness Cervical / Trunk Assessment Cervical / Trunk Assessment: Normal   Balance Balance Balance Assessed: Yes Static Standing Balance Static Standing - Balance Support: No upper extremity supported;During functional activity Static Standing - Level  of Assistance: 4: Min assist Static Standing - Comment/# of  Minutes: stood at toilet to use toilet for 5 min with steadying assist.    End of Session PT - End of Session Equipment Utilized During Treatment: Gait belt;Oxygen Activity Tolerance: Patient limited by lethargy;Patient limited by fatigue (orthostatic) Patient left: in bed;with call bell/phone within reach Nurse Communication: Mobility status  GP Functional Assessment Tool Used: clinical judgment Functional Limitation: Mobility: Walking and moving around Mobility: Walking and Moving Around Current Status ([id number]): At least 20 percent but less than 40 percent impaired, limited or restricted Mobility: Walking and Moving Around Goal Status [phone]): At least 1 percent but less than 20 percent impaired, limited or restricted   [name] 02/16/2013, 4:43 PM [facility] [phone] (pager)

## 2013-02-16 NOTE — Progress Notes (Signed)
Patient arrived from ED via stretcher. Patient alert, oriented and ambulatory with stand by assist. Admission weight, vitals and assessment completed. Fall and safety plan reviewed with patient.  Patient resting comfortably, bed alarm in use. Will continue to monitor. Blood pressure 106/51, pulse 64, temperature 97.1 F (36.2 C), temperature source Oral, resp. rate 22, height 5\' 6"  (1.676 m), weight 60.4 kg (133 lb 2.5 oz), SpO2 100.00%. Troy Sine

## 2013-02-16 NOTE — Progress Notes (Signed)
SATURATION QUALIFICATIONS: (This note is used to comply with regulatory documentation for home oxygen)  Patient Saturations on Room Air at Rest = 91%  Patient Saturations on Room Air while Ambulating = 86%  Patient Saturations on 2 Liters of oxygen while Ambulating = 93%  Please briefly explain why patient needs home oxygen:Pt desats with activity without O2.  Thanks. [facility] [phone] (pager)

## 2013-02-16 NOTE — Progress Notes (Addendum)
[facility]                                                                                Patient Demographics  [name], is a 79 y.o. male, DOB - 12/30/1932, ZOX:[id number]  Admit date - 02/15/2013   Admitting Physician Lynden Oxford, MD  Outpatient Primary MD for the patient is Pearson Grippe, MD  LOS - 1   Chief Complaint  Patient presents with  . Loss of Consciousness        Assessment & Plan    1.Syncope with H/O chronic systolic heart failure, AICD placement. History of CAD with CABG. Does have frequent PVCs and bigeminy on telemetry monitor however AICD interrogation has been stable, troponins negative, cardiology has seen the patient. Currently slightly tachycardic no irritation changes made thus far by cardiology we'll await their input. He is not orthostatic. We'll increase activity and monitor.   2. Dyslipidemia. Continue home dose statin.    3. Chronic atrial fibrillation. On Eliquis, digoxin and Toprol-XL, currently mildly tachycardic we'll add his beta blocker back if blood pressure allows continue digoxin. Will check a digoxin level    4. History of COPD on home oxygen. No wheezing on exam no acute issues, continue oxygen. Nebulizer treatments as needed.      5. History of prostate and lung cancer in the past. No acute issues outpatient followup with PCP and oncologist as needed.     6. Chronic systolic heart failure EF 25%. Currently compensated. Continue ACE inhibitor, Imdur, beta blocker.      Code Status: Full  Family Communication:   Disposition Plan: Home   Procedures CT head and C-spine, x-ray chest and pelvis.   Consults  Cards   Medications  Scheduled Meds: . allopurinol  300 mg Oral Daily  . atorvastatin  20 mg Oral Daily  . digoxin  125 mcg Oral Daily  . isosorbide mononitrate  30 mg Oral Daily  . lisinopril  10 mg Oral BH-[id number]  . pantoprazole  40 mg Oral Daily  . rivaroxaban  15 mg Oral Q supper  . sodium chloride   3 mL Intravenous Q12H   Continuous Infusions:  PRN Meds:.acetaminophen, acetaminophen, albuterol, meclizine  DVT Prophylaxis  Xaralto  Lab Results  Component Value Date   PLT 156 02/16/2013    Antibiotics     Anti-infectives   None          Subjective:   [name] today has, No headache, No chest pain, No abdominal pain - No Nausea, No new weakness tingling or numbness, No Cough - SOB.    Objective:   Filed Vitals:   02/16/13 0649 02/16/13 1040 02/16/13 1043 02/16/13 1044  BP: 104/56 121/63 99/52 112/66  Pulse: 73 64 63 122  Temp: 97.7 F (36.5 C) 97.7 F (36.5 C)    TempSrc: Oral Oral    Resp: 17 18 18    Height:      Weight: 60.1 kg (132 lb 7.9 oz)     SpO2: 99% 100% 100%     Wt Readings from Last 3 Encounters:  02/16/13 60.1 kg (132 lb 7.9 oz)  01/06/13 66.225 kg (146 lb)  12/16/12 62.143  kg (137 lb)     Intake/Output Summary (Last 24 hours) at 02/16/13 1152 Last data filed at 02/16/13 1042  Gross per 24 hour  Intake    943 ml  Output    675 ml  Net    268 ml    Exam Awake Alert, Oriented X 3, No new F.N deficits, Normal affect [place].AT,PERRAL Supple Neck,No JVD, No cervical lymphadenopathy appriciated.  Symmetrical Chest wall movement, Good air movement bilaterally, CTAB RRR,No Gallops,Rubs or new Murmurs, No Parasternal Heave +ve B.Sounds, Abd Soft, Non tender, No organomegaly appriciated, No rebound - guarding or rigidity. No Cyanosis, Clubbing or edema, No new Rash or bruise      Data Review   Micro Results Recent Results (from the past 240 hour(s))  MRSA PCR SCREENING     Status: None   Collection Time    02/15/13 10:14 PM      Result Value Range Status   MRSA by PCR NEGATIVE  NEGATIVE Final   Comment:            The GeneXpert MRSA Assay (FDA     approved for NASAL specimens     only), is one component of a     comprehensive MRSA colonization     surveillance program. It is not     intended to diagnose MRSA     infection nor to  guide or     monitor treatment for     MRSA infections.    Radiology Reports Dg Chest 2 View  02/15/2013   CLINICAL DATA:  Larey Seat.  Chest pain.  EXAM: CHEST  2 VIEW  COMPARISON:  12/16/2012.  FINDINGS: The pacer wires/ AICD are stable. The heart is enlarged but unchanged. The mediastinal and hilar contours are within normal limits and stable. Stable surgical changes related to bypass surgery. There are chronic bronchitic type interstitial lung changes and interstitial scarring. No infiltrates, edema or effusions. Mild chronic pleural scarring changes on the left and associated calcifications.  IMPRESSION: Chronic lung changes and mild cardiac enlargement but no definite acute overlying pulmonary process.   Electronically Signed   By: Loralie Champagne M.D.   On: 02/15/2013 17:23   Dg Pelvis 1-2 Views  02/15/2013   CLINICAL DATA:  Larey Seat.  Left buttock pain.  EXAM: PELVIS - 1-2 VIEW  COMPARISON:  None.  FINDINGS: Both hips are normally located. No acute hip fracture. The pubic symphysis and SI joints are intact. No pelvic fractures.  IMPRESSION: No acute bony findings.   Electronically Signed   By: Loralie Champagne M.D.   On: 02/15/2013 17:26   Ct Head Wo Contrast  02/15/2013   CLINICAL DATA:  Two episodes of syncope; status post falls. Concern for head or cervical spine injury.  EXAM: CT HEAD WITHOUT CONTRAST  CT CERVICAL SPINE WITHOUT CONTRAST  TECHNIQUE: Multidetector CT imaging of the head and cervical spine was performed following the standard protocol without intravenous contrast. Multiplanar CT image reconstructions of the cervical spine were also generated.  COMPARISON:  CT of the head performed 07/31/2012, and CT of the cervical spine performed 12/21/2011  FINDINGS: CT HEAD FINDINGS  There is no evidence of acute infarction, mass lesion, or intra- or extra-axial hemorrhage on CT.  Prominence of the ventricles and sulci reflects mild to moderate cortical volume loss. Mild subcortical white matter change  likely reflects small vessel ischemic microangiopathy. Cerebellar atrophy is noted.  The brainstem and fourth ventricle are within normal limits. The basal ganglia are unremarkable  in appearance. The cerebral hemispheres demonstrate grossly normal gray-white differentiation. No mass effect or midline shift is seen.  There is no evidence of fracture; visualized osseous structures are unremarkable in appearance. The orbits are within normal limits. The paranasal sinuses and mastoid air cells are well-aerated. No significant soft tissue abnormalities are seen.  CT CERVICAL SPINE FINDINGS  There is no evidence of fracture or subluxation. Vertebral bodies demonstrate normal height and alignment. There is mild multilevel disc space narrowing along the lower cervical spine. Prominent lateral osteophytes are seen at the lower cervical spine, and mild underlying facet disease is seen. Prevertebral soft tissues are within normal limits. The visualized neural foramina are grossly unremarkable.  The thyroid gland is unremarkable in appearance. The visualized lung apices are clear. Dense calcification is noted at the carotid bifurcations bilaterally, worse on the right. A 1.8 cm peripherally calcified mass is noted at the superior aspect of the left parotid gland; this is grossly stable from 2013 and likely benign.  IMPRESSION: 1. No evidence of traumatic intracranial injury or fracture. 2. No evidence of fracture or subluxation along the cervical spine. 3. Mild to moderate cortical volume loss and scattered small vessel ischemic microangiopathy. 4. Dense calcification at the carotid bifurcations bilaterally, worse on the right. On the prior carotid ultrasound from 2013, there was moderate right-sided and mild left-sided carotid stenosis. 5. Mild degenerative change noted along the cervical spine.   Electronically Signed   By: Roanna Raider M.D.   On: 02/15/2013 21:57       CBC  Recent Labs Lab 02/15/13 1526  02/16/13 0545  WBC 4.4 3.6*  HGB 9.9* 9.4*  HCT 30.0* 28.4*  PLT 178 156  MCV 89.0 88.8  MCH 29.4 29.4  MCHC 33.0 33.1  RDW 15.9* 16.0*    Chemistries   Recent Labs Lab 02/15/13 1526 02/16/13 0545  NA 137 137  K 4.3 4.2  CL 98 99  CO2 27 28  GLUCOSE 150* 100*  BUN 25* 22  CREATININE 1.22 1.12  CALCIUM 9.8 9.4  AST  --  18  ALT  --  19  ALKPHOS  --  88  BILITOT  --  0.5   ------------------------------------------------------------------------------------------------------------------ estimated creatinine clearance is 46.2 ml/min (by C-G formula based on Cr of 1.12). ------------------------------------------------------------------------------------------------------------------ No results found for this basename: HGBA1C,  in the last 72 hours ------------------------------------------------------------------------------------------------------------------ No results found for this basename: CHOL, HDL, LDLCALC, TRIG, CHOLHDL, LDLDIRECT,  in the last 72 hours ------------------------------------------------------------------------------------------------------------------ No results found for this basename: TSH, T4TOTAL, FREET3, T3FREE, THYROIDAB,  in the last 72 hours ------------------------------------------------------------------------------------------------------------------ No results found for this basename: VITAMINB12, FOLATE, FERRITIN, TIBC, IRON, RETICCTPCT,  in the last 72 hours  Coagulation profile No results found for this basename: INR, PROTIME,  in the last 168 hours  No results found for this basename: DDIMER,  in the last 72 hours  Cardiac Enzymes No results found for this basename: CK, CKMB, TROPONINI, MYOGLOBIN,  in the last 168 hours ------------------------------------------------------------------------------------------------------------------ No components found with this basename: POCBNP,      Time Spent in minutes  35   Annsleigh Dragoo K M.D on 02/16/2013 at 11:52 AM  Between 7am to 7pm - Pager - [phone]  After 7pm go to [email or web address] - password [email or web address]  And look for the night coverage person covering for me after hours  [facility] Office  [phone]

## 2013-02-16 NOTE — Progress Notes (Signed)
CMT reports the patient had some bigeminy and trigeminy; patient is asymptomatic, MD on unit and notified, orders given and followed; will continue to monitor patient. Lorretta Harp RN

## 2013-02-16 NOTE — Consult Note (Signed)
CARDIOLOGY CONSULT NOTE   Patient ID: [name] MRN: [id number] DOB/AGE: 1933-01-11 79 y.o.  Admit date: 02/15/2013  Primary Physician   Pearson Grippe, MD Primary Cardiologist  Dr. [name] Reason for Consultation  syncope  HPI: [name] is a 79 y.o. male with a history of HTN, HLD, chronic Afib on riveroxiban and digoxin, symptomatic bradycardia, status post biventricular ICD implantation, ischemic cardiomyopathy, systolic heart failure (EF 20-25% by ECHO in 2014), coronary artery disease status post CABG (2004), CVA and prostate cancer who is being evaluated today for two episodes of syncope. He passed out on Saturday morning while going to the mailbox and Sunday afternoon while in the kitchen preparing something to eat. It is unclear how long he was unconscious, although he reports that his daughter witnessed the second episode and said it was only minutes and there was no convulsions. He has never had anything like this before. He denies palpitations, diaphoresis, n/v, or abdominal pain before the episodes. He reports lightheadedness and some left sided chest pain near the axilla with associated shortness of breath during the second episode. When he regained consciousness the pain had resolved. He has no chest pain or shortness of breath currently. He has a biventricvular device, which was interrogated today. The devices is functioning properly and he has had no events.160 bpm is his first detection for treatment. His history indicates that he has diabetes; however the patient denies this and does not take any medications at home. His blood glucose is slightly elevated at 100 and 150 while in the hospital. He does report numbness and tingling in the lower extremities and loss of sensation in the feet. However, he reports that "he froze his feet in 1969 while snowblowing." He mentions that he has baseline orthostasis and has to wait for a while until he feels steady on his feet after  changing position. No fever, chills or recent illness.    EKG with no acute changes and troponin negative.   Past Medical History  Diagnosis Date  . Diabetes mellitus   . Hypertension   . Chronic systolic CHF (congestive heart failure)     a. 12/2012 Echo: EF 20-25%, mid-dist antsept AK, mod dil LA.  [name] CAD (coronary artery disease)     a. 07/2002 CABG x 3: LIMA->LAD, VG->Diag, VG->OM;  b. 06/2006 Cath: LM 50-60ost/p, LAD patent mid stent, D1 sev dzs, D2 patent stent, LCX nl, OM2 sev sten prox, RCA large/nl, VG->Diag nl, VG->OM nl, LIMA->LAD atretic, EF 30%.  . Ischemic cardiomyopathy     a. 06/2006 s/p SJM Promote RF, model 3207-36 BiV ICD, ser #: [id number].  [name] Hypercholesterolemia   . Black lung disease   . Nephrolithiasis 09/2000  . CVA (cerebral vascular accident)   . Prostate cancer 11/01/10    gleason 7, 8, 9, gold seeds 02/08/11  . Lung cancer   . COPD (chronic obstructive pulmonary disease)     a. On home O2.  [name] GERD (gastroesophageal reflux disease)   . Arthritis   . Gout   . Pacemaker      Past Surgical History  Procedure Laterality Date  . Colonoscopy    . Esophagogastroduodenoscopy  02/19/2011    Procedure: ESOPHAGOGASTRODUODENOSCOPY (EGD);  Surgeon: Petra Kuba, MD;  Location: [facility] ENDOSCOPY;  Service: Endoscopy;  Laterality: N/A;  . Upper endoscpopy    . Coronary angioplasty with stent placement  07/1997; 08/1997;03/1998  . Coronary artery bypass graft  07/2002  CABG X3  . Incision and drainage of wound  08/2002    right thigh; S/P EVH  . Insert / replace / remove pacemaker  1979; 1992; 01/2000;  . Insert / replace / remove pacemaker  09/2003; 06/2006    w/AICD  . Insert / replace / remove pacemaker  12/2004    pacmaker explant  . Shoulder arthroscopy w/ rotator cuff repair  05/2008    left    Allergies  Allergen Reactions  . Aspirin Hives    I have reviewed the patient's current medications . allopurinol  300 mg Oral Daily  . atorvastatin  20 mg Oral Daily  .  digoxin  125 mcg Oral Daily  . isosorbide mononitrate  30 mg Oral Daily  . lisinopril  10 mg Oral [id number]  . pantoprazole  40 mg Oral Daily  . rivaroxaban  15 mg Oral Q supper  . sodium chloride  3 mL Intravenous Q12H     acetaminophen, acetaminophen, albuterol, meclizine  Prior to Admission medications   Medication Sig Start Date End Date Taking? Authorizing Provider  albuterol (PROVENTIL) (5 MG/ML) 0.5% nebulizer solution Take 0.5 mLs (2.5 mg total) by nebulization every 6 (six) hours as needed for wheezing or shortness of breath. 07/03/12  Yes Pearson Grippe, MD  allopurinol (ZYLOPRIM) 300 MG tablet Take 300 mg by mouth daily.     Yes Historical Provider, MD  atorvastatin (LIPITOR) 20 MG tablet Take 20 mg by mouth daily.     Yes Historical Provider, MD  CALCIUM-VITAMIN D PO Take 1 tablet by mouth daily.    Yes Historical Provider, MD  Cyanocobalamin (VITAMIN B-12 PO) Take 1 tablet by mouth 2 (two) times daily.    Yes Historical Provider, MD  digoxin (LANOXIN) 0.125 MG tablet Take 125 mcg by mouth daily.     Yes Historical Provider, MD  furosemide (LASIX) 20 MG tablet Take 1 tablet (20 mg total) by mouth daily. 12/18/12  Yes Ripudeep Jenna Luo, MD  isosorbide mononitrate (IMDUR) 30 MG 24 hr tablet Take 30 mg by mouth daily.     Yes Historical Provider, MD  lisinopril (PRINIVIL,ZESTRIL) 10 MG tablet Take 10 mg by mouth every morning.    Yes Historical Provider, MD  loratadine (CLARITIN) 10 MG tablet Take 1 tablet (10 mg total) by mouth daily. 07/03/12  Yes Pearson Grippe, MD  meclizine (ANTIVERT) 12.5 MG tablet Take 1 tablet (12.5 mg total) by mouth 2 (two) times daily as needed for dizziness. 12/17/12  Yes Ripudeep Jenna Luo, MD  metoprolol succinate (TOPROL-XL) 25 MG 24 hr tablet Take 1 tablet (25 mg total) by mouth daily. 12/18/12  Yes Ripudeep K Rai, MD  NITROSTAT 0.4 MG SL tablet Place 0.4 mg under the tongue every 5 (five) minutes as needed for chest pain.  10/17/11  Yes Historical Provider, MD  omeprazole  (PRILOSEC) 20 MG capsule Take 20 mg by mouth every morning.    Yes Historical Provider, MD  potassium chloride SA (K-DUR,KLOR-CON) 20 MEQ tablet Take 20 mEq by mouth daily.   Yes Historical Provider, MD  Rivaroxaban (XARELTO) 15 MG TABS tablet Take 15 mg by mouth daily with supper.   Yes Historical Provider, MD     History   Social History  . Marital Status: Widowed    Spouse Name: N/A    Number of Children: N/A  . Years of Education: N/A   Occupational History  . retired     Medical laboratory scientific officer   Social History Main Topics  .  Smoking status: Former Smoker    Types: Cigarettes    Quit date: 03/12/1949  . Smokeless tobacco: Former Neurosurgeon    Types: Chew  . Alcohol Use: No     Comment: drank until per pt 03/19/11  . Drug Use: No  . Sexual Activity: Yes   Other Topics Concern  . Not on file   Social History Narrative  . No narrative on file    Family Status  Relation Status Death Age  . Father Deceased   . Mother Deceased   . Sister Deceased   . Brother Alive   . Brother Alive   . Brother Alive    Family History  Problem Relation Age of Onset  . Alzheimer's disease Father 46  . Cancer Father 83    metastatic prostate cancer  . Diabetes Sister 55  . Diabetes Brother   . Diabetes Brother   . Diabetes Brother   . Hypotension Neg Hx   . Malignant hyperthermia Neg Hx   . Pseudochol deficiency Neg Hx      ROS:  Full 14 point review of systems complete and found to be negative unless listed above.  Physical Exam: Blood pressure 104/56, pulse 73, temperature 97.7 F (36.5 C), temperature source Oral, resp. rate 17, height 5\' 6"  (1.676 m), weight 132 lb 7.9 oz (60.1 kg), SpO2 99.00%.  General: Alert, Awake and Oriented to Time, Place and Person. No acute distress  Eyes: PERRL  ENT: Oral Mucosa clear moist  Neck: no JVD  Cardiovascular: S1 and S2 Present, aortic systolic Murmur, Peripheral Pulses Present  Respiratory: Bilateral Air entry equal and Decreased, Clear to  Auscultation,  no Crackles,no wheezes  Abdomen: Bowel Sound Present, Soft and Non tender  Skin: no Rash  Extremities: no Pedal edema, no calf tenderness  Neurologic: Grossly Unremarkable.  Labs:   Lab Results  Component Value Date   WBC 3.6* 02/16/2013   HGB 9.4* 02/16/2013   HCT 28.4* 02/16/2013   MCV 88.8 02/16/2013   PLT 156 02/16/2013      Recent Labs Lab 02/16/13 0545  NA 137  K 4.2  CL 99  CO2 28  BUN 22  CREATININE 1.12  CALCIUM 9.4  PROT 7.2  BILITOT 0.5  ALKPHOS 88  ALT 19  AST 18  GLUCOSE 100*  ALBUMIN 3.3*   Magnesium  Date Value Range Status  05/14/2011 1.3* 1.5 - 2.5 mg/dL Final   Pro B Natriuretic peptide (BNP)  Date/Time Value Range Status  12/16/2012  9:10 AM 2381.0* 0 - 450 pg/mL Final  07/03/2012  8:42 AM 1294.0* 0 - 450 pg/mL Final    TSH  Date/Time Value Range Status  06/30/2012  5:19 PM 2.069  0.350 - 4.500 uIU/mL Final     Echo: 12/16/12 Transthoracic Echocardiography ---------------------------------------------------------- LV EF: 20% - 25% ----------------------------------------------------------- Indications: Chest pain 786.51. ------------------------------------------------------------ History: PMH: Syncope. Atrial fibrillation. Congestive heart failure. ------------------------------------------------------------ Study Conclusions - Left ventricle: The cavity size was mildly dilated. Wall thickness was increased in a pattern of mild LVH. Systolic function was severely reduced. The estimated ejection fraction was in the range of 20% to 25%. There is akinesis of the mid-distalanteroseptal myocardium. - Left atrium: The atrium was moderately dilated. Left ventricle: The cavity size was mildly dilated. Wall thickness was increased in a pattern of mild LVH. Systolic function was severely reduced. The estimated ejection fraction was in the range of 20% to 25%. Regional wall motion abnormalities: There is akinesis of  the mid-distalanteroseptal myocardium.  The study was not technically sufficient to allow evaluation of LV diastolic dysfunction due to atrial fibrillation. ------------------------------------------------------------ Aortic valve: Moderately thickened, mildly calcified leaflets. Sclerosis without stenosis. Doppler: No significant regurgitation. ------------------------------------------------------------ Aorta: The aorta was normal, not dilated, and non-diseased. ------------------------------------------------------------ Mitral valve: Structurally normal valve. Leaflet separation was normal. Doppler: Transvalvular velocity was within the normal range. There was no evidence for stenosis. No regurgitation. Peak gradient: 2mm Hg (D). ------------------------------------------------------------ Left atrium: The atrium was moderately dilated. ------------------------------------------------------------ Right ventricle: The cavity size was normal. Wall thickness was normal. Pacer wire or catheter noted in right ventricle. Systolic function was normal. ------------------------------------------------------------ Pulmonic valve: Structurally normal valve. Cusp separation was normal. Doppler: Transvalvular velocity was within the normal range. No regurgitation. ----------------------------------------------------------- Tricuspid valve: Structurally normal valve. Leaflet separation was normal. Doppler: Transvalvular velocity was within the normal range. No regurgitation. ------------------------------------------------------------ Right atrium: The atrium was normal in size. ---------------------------------------------------------- Pericardium: The pericardium was normal in appearance. ------------------------------------------------------------ Post procedure conclusions Ascending Aorta:  - The aorta was normal, not dilated, and non-diseased.    ECG: HR 62-- Ventricular-paced  rhythm with frequent Premature ventricular complexes  Radiology:    Chest 2 View  02/15/2013   CLINICAL DATA:  Larey Seat.  Chest pain.  EXAM: CHEST  2 VIEW  COMPARISON:  12/16/2012.  FINDINGS: The pacer wires/ AICD are stable. The heart is enlarged but unchanged. The mediastinal and hilar contours are within normal limits and stable. Stable surgical changes related to bypass surgery. There are chronic bronchitic type interstitial lung changes and interstitial scarring. No infiltrates, edema or effusions. Mild chronic pleural scarring changes on the left and associated calcifications.  IMPRESSION: Chronic lung changes and mild cardiac enlargement but no definite acute overlying pulmonary process.     Dg Pelvis 1-2 Views  02/15/2013   CLINICAL DATA:  Larey Seat.  Left buttock pain.  EXAM: PELVIS - 1-2 VIEW  COMPARISON:  None.  FINDINGS: Both hips are normally located. No acute hip fracture. The pubic symphysis and SI joints are intact. No pelvic fractures.  IMPRESSION: No acute bony findings.      Ct Cervical Spine Wo Contrast  02/15/2013   CLINICAL DATA:  Two episodes of syncope; status post falls. Concern for head or cervical spine injury.  EXAM: CT HEAD WITHOUT CONTRAST  CT CERVICAL SPINE WITHOUT CONTRAST  TECHNIQUE: Multidetector CT imaging of the head and cervical spine was performed following the standard protocol without intravenous contrast. Multiplanar CT image reconstructions of the cervical spine were also generated.  COMPARISON:  CT of the head performed 07/31/2012, and CT of the cervical spine performed 12/21/2011  FINDINGS: CT HEAD FINDINGS  There is no evidence of acute infarction, mass lesion, or intra- or extra-axial hemorrhage on CT.  Prominence of the ventricles and sulci reflects mild to moderate cortical volume loss. Mild subcortical white matter change likely reflects small vessel ischemic microangiopathy. Cerebellar atrophy is noted.  The brainstem and fourth ventricle are within normal limits.  The basal ganglia are unremarkable in appearance. The cerebral hemispheres demonstrate grossly normal gray-white differentiation. No mass effect or midline shift is seen.  There is no evidence of fracture; visualized osseous structures are unremarkable in appearance. The orbits are within normal limits. The paranasal sinuses and mastoid air cells are well-aerated. No significant soft tissue abnormalities are seen.  CT CERVICAL SPINE FINDINGS  There is no evidence of fracture or subluxation. Vertebral bodies demonstrate normal height and alignment. There is mild multilevel disc space narrowing along the lower cervical spine. Prominent  lateral osteophytes are seen at the lower cervical spine, and mild underlying facet disease is seen. Prevertebral soft tissues are within normal limits. The visualized neural foramina are grossly unremarkable.  The thyroid gland is unremarkable in appearance. The visualized lung apices are clear. Dense calcification is noted at the carotid bifurcations bilaterally, worse on the right. A 1.8 cm peripherally calcified mass is noted at the superior aspect of the left parotid gland; this is grossly stable from 2013 and likely benign.  IMPRESSION: 1. No evidence of traumatic intracranial injury or fracture. 2. No evidence of fracture or subluxation along the cervical spine. 3. Mild to moderate cortical volume loss and scattered small vessel ischemic microangiopathy. 4. Dense calcification at the carotid bifurcations bilaterally, worse on the right. On the prior carotid ultrasound from 2013, there was moderate right-sided and mild left-sided carotid stenosis. 5. Mild degenerative change noted along the cervical spine.    ASSESSMENT AND PLAN:    Principal Problem:   Syncope Active Problems:   Systolic CHF, chronic   ICD-St.Jude   S/P CABG (coronary artery bypass graft)   On home oxygen therapy   Atrial fibrillation  [name] is a 79 y.o. male with a history of HTN, HLD,  chronic Afib on riveroxiban and digoxin, symptomatic bradycardia, status post biventricular ICD implantation, ischemic cardiomyopathy, systolic heart failure (EF 20-25% by ECHO in 2014), coronary artery disease status post CABG (2004), CVA and prostate cancer who is being evaluated today for two episodes of syncope.  1. Syncope - 2 episodes over the weekend -- EKG shows a paced rhythm with PVCs, troponin negative -- His initial laboratory does not show any significant abnormality, other than chronic anemia. Digoxin levels normal -- His pacemaker interrogation was unremarkable with no events -- Continue to hold Lopressor with soft blood pressure -- Syncope is most likely caused by orthostatic hypotension, we will await results of blood pressure measurements from nurses  2. Atrial fibrillation  -- Rate controlled, paced rhythm  -- Continue Xarelto, digoxin   3. COPD  -- Currently stable continue oxygen as needed   4. Anemia  -- Patient denies any active bleeding or melena  -- Monitor H&H per IM  5. carotid artery disease.       There is a carotid Doppler from November, 2013 and raises the question of significant distal internal carotid artery stenosis or occlusion. It seems it may be possible that minor changes in his blood pressure could cause significant CNS effects. Suggest further complete evaluation of this area. CT scan or MRI may be needed. Neurology evaluation or vascular surgery input may also be needed.  6. question orthostatic hypotension     This question has been raised. The orthostatic blood pressure checks in the hospital are confusing. The patient appears to drop his pressure when he sits up. Further in the evaluation we have not documented significant hypotension when standing. Regardless, there may be a component of orthostasis.  Based on the observations above, I plan to decrease the doses of his medications even though we are not seeing marked documented orthostasis. I  recommend aggressive evaluation of his carotid and overall CNS perfusion. I feel that his problem is not based on any arrhythmia.  SignedThereasa Parkin, PA-C 02/16/2013 10:40 AM Patient seen and examined. I agree with the assessment and plan as detailed above. See also my additional thoughts below.   Patient seen and examined. I agree with the assessment and plan as detailed above. See also my  additional thoughts below.   I have carefully reviewed all of the information about this patient. I have rewritten part of the note above including the problem list. The patient does get a warning about his symptoms. I feel that he is not having syncope based on arrhythmias. I feel there could be a component of mild orthostasis along with significant CNS vascular disease. I will be lowering the dose of some of his medicines.  Willa Rough, MD, [facility] 02/16/2013 12:31 PM    Willa Rough, MD, [facility] 02/16/2013 12:31 PM

## 2013-02-16 NOTE — Progress Notes (Signed)
Patient ambulated 494ft in hallway; will continue to monitor patient. Lorretta Harp RN

## 2013-02-17 MED ORDER — METOPROLOL TARTRATE 25 MG PO TABS: 12.5000 mg | ORAL_TABLET | Freq: Two times a day (BID) | ORAL | Status: DC

## 2013-02-17 MED ORDER — SODIUM CHLORIDE 0.9 % IV BOLUS (SEPSIS)
250.0000 mL | Freq: Once | INTRAVENOUS | Status: AC
  Administered 2013-02-17: 250 mL via INTRAVENOUS

## 2013-02-17 MED ORDER — LISINOPRIL 2.5 MG PO TABS: 2.5000 mg | ORAL_TABLET | Freq: Every morning | ORAL | Status: DC

## 2013-02-17 MED ORDER — MIDODRINE HCL 5 MG PO TABS
5.0000 mg | ORAL_TABLET | Freq: Three times a day (TID) | ORAL | Status: DC
  Administered 2013-02-17 (×2): 5 mg via ORAL
  Filled 2013-02-17 (×3): qty 1

## 2013-02-17 MED ORDER — MIDODRINE HCL 5 MG PO TABS: 5.0000 mg | ORAL_TABLET | Freq: Three times a day (TID) | ORAL | Status: DC

## 2013-02-17 NOTE — Progress Notes (Signed)
Physical Therapy Treatment Patient Details Name: [name] MRN: [id number] DOB: March 01, 1933 Today's Date: 02/17/2013 Time: 4098-1191 PT Time Calculation (min): 24 min  PT Assessment / Plan / Recommendation  History of Present Illness Pt admit for syncope.   PT Comments   Patient tolerated treatment well today and did not exhibit significant drop in  BP with positional changes or with activity. Will continue to follow in order to work towards goal of mobility and independence.  Follow Up Recommendations  No PT follow up;Supervision - Intermittent     Does the patient have the potential to tolerate intense rehabilitation     Barriers to Discharge        Equipment Recommendations  None recommended by PT    Recommendations for Other Services    Frequency Min 3X/week   Progress towards PT Goals Progress towards PT goals: Goals met and updated - see care plan  Plan Current plan remains appropriate    Precautions / Restrictions Precautions Precautions: Fall Restrictions Weight Bearing Restrictions: No   Pertinent Vitals/Pain At rest BP 120/57, HR 64 SpO2 100% on 1.5L O2 Upon transition to sitting EOB BP 106/63, HR 64 100% on RA Upon transition to standing BP 110/46, HR 64, 97% on RA After ambulation BP 104/65, HR 72, 100% on RA Pt reports no pain.    Mobility  Bed Mobility Bed Mobility: Supine to Sit;Sitting - Scoot to Edge of Bed Supine to Sit: 6: Modified independent (Device/Increase time) Sitting - Scoot to Edge of Bed: 6: Modified independent (Device/Increase time) Details for Bed Mobility Assistance: decreased time for safety, to check vitals Transfers Transfers: Sit to Stand;Stand to Sit Sit to Stand: 6: Modified independent (Device/Increase time);From bed;With upper extremity assist Stand to Sit: 6: Modified independent (Device/Increase time);To chair/3-in-1;With upper extremity assist Details for Transfer Assistance: cues for hand  placement Ambulation/Gait Ambulation/Gait Assistance: 5: Supervision Ambulation Distance (Feet): 250 Feet Assistive device: None Ambulation/Gait Assistance Details: cues to not rely on wall rail; supervision for safety Gait Pattern: Step-to pattern;Decreased stride length;Wide base of support General Gait Details: pt ambulated safely with supervision and no AD; wide gait pattern with shorter steps but no LOB episodes Stairs: No    Exercises General Exercises - Lower Extremity Hip Flexion/Marching: AROM;Both;10 reps;Seated Toe Raises: AROM;Both;15 reps;Seated Heel Raises: AROM;Both;15 reps;Seated   PT Diagnosis:    PT Problem List:   PT Treatment Interventions:     PT Goals (current goals can now be found in the care plan section) Acute Rehab PT Goals Patient Stated Goal: to go home PT Goal Formulation: With patient Time For Goal Achievement: 02/23/13 Potential to Achieve Goals: Good  Visit Information  Last PT Received On: 02/17/13 Assistance Needed: +1 History of Present Illness: Pt admit for syncope.    Subjective Data  Patient Stated Goal: to go home   Cognition  Cognition Arousal/Alertness: Awake/alert Behavior During Therapy: WFL for tasks assessed/performed Overall Cognitive Status: Within Functional Limits for tasks assessed    Balance     End of Session PT - End of Session Equipment Utilized During Treatment: Gait belt Activity Tolerance: Patient tolerated treatment well Patient left: in bed;with call bell/phone within reach Nurse Communication: Mobility status   GP     Willette Pa, SPT 02/17/2013, 3:09 PM

## 2013-02-17 NOTE — Progress Notes (Signed)
Patient's BP is 96/54, metoprolol held and MD notified, orders given and followed; will continue to monitor patient. Lorretta Harp RN

## 2013-02-17 NOTE — Progress Notes (Signed)
Pt refuses all home health services and SNF placement at this time.

## 2013-02-17 NOTE — Progress Notes (Signed)
Patient: [name] Date of Encounter: 02/17/2013, 7:39 AM Admit date: 02/15/2013     Subjective  [name] has no new complaints.    Objective  Physical Exam: Vitals: BP 104/51  Pulse 63  Temp(Src) 97.9 F (36.6 C) (Oral)  Resp 18  Ht 5\' 6"  (1.676 m)  Wt 132 lb 1.6 oz (59.92 kg)  BMI 21.33 kg/m2  SpO2 100% General: Well developed, well appearing 79 year old male in no acute distress. Neck: Supple. JVD not elevated. Lungs: Clear bilaterally to auscultation without wheezes, rales, or rhonchi. Breathing is unlabored. Heart: RRR S1 S2 without murmurs, rubs, or gallops.  Abdomen: Soft, non-distended. Extremities: No clubbing or cyanosis. No edema.  Distal pedal pulses are 2+ and equal bilaterally. Neuro: Alert and oriented X 3. Moves all extremities spontaneously. No focal deficits.  Intake/Output:  Intake/Output Summary (Last 24 hours) at 02/17/13 0739 Last data filed at 02/17/13 6433  Gross per 24 hour  Intake   1063 ml  Output   2000 ml  Net   -937 ml    Inpatient Medications:  . allopurinol  300 mg Oral Daily  . atorvastatin  20 mg Oral Daily  . digoxin  125 mcg Oral Daily  . lisinopril  5 mg Oral q morning - 10a  . metoprolol tartrate  25 mg Oral BID  . pantoprazole  40 mg Oral Daily  . rivaroxaban  15 mg Oral Q supper  . sodium chloride  3 mL Intravenous Q12H    Labs:  Recent Labs  02/15/13 1526 02/16/13 0545  NA 137 137  K 4.3 4.2  CL 98 99  CO2 27 28  GLUCOSE 150* 100*  BUN 25* 22  CREATININE 1.22 1.12  CALCIUM 9.8 9.4    Recent Labs  02/16/13 0545  AST 18  ALT 19  ALKPHOS 88  BILITOT 0.5  PROT 7.2  ALBUMIN 3.3*    Recent Labs  02/15/13 1526 02/16/13 0545  WBC 4.4 3.6*  HGB 9.9* 9.4*  HCT 30.0* 28.4*  MCV 89.0 88.8  PLT 178 156    Radiology/Studies: Dg Chest 2 View 02/15/2013    IMPRESSION: Chronic lung changes and mild cardiac enlargement but no definite acute overlying pulmonary process.    Electronically Signed    By: Loralie Champagne M.D.   On: 02/15/2013 17:23   Ct Head Wo Contrast 02/15/2013    IMPRESSION: 1. No evidence of traumatic intracranial injury or fracture. 2. No evidence of fracture or subluxation along the cervical spine. 3. Mild to moderate cortical volume loss and scattered small vessel ischemic microangiopathy. 4. Dense calcification at the carotid bifurcations bilaterally, worse on the right. On the prior carotid ultrasound from 2013, there was moderate right-sided and mild left-sided carotid stenosis. 5. Mild degenerative change noted along the cervical spine.    Electronically Signed   By: Roanna Raider M.D.   On: 02/15/2013 21:57   Ct Cervical Spine Wo Contrast 02/15/2013    IMPRESSION: 1. No evidence of traumatic intracranial injury or fracture. 2. No evidence of fracture or subluxation along the cervical spine. 3. Mild to moderate cortical volume loss and scattered small vessel ischemic microangiopathy. 4. Dense calcification at the carotid bifurcations bilaterally, worse on the right. On the prior carotid ultrasound from 2013, there was moderate right-sided and mild left-sided carotid stenosis. 5. Mild degenerative change noted along the cervical spine.    Electronically Signed   By: Roanna Raider M.D.   On: 02/15/2013  21:57   Echocardiogram: October 2014 Left ventricle: The cavity size was mildly dilated. Wall thickness was increased in a pattern of mild LVH. Systolic function was severely reduced. The estimated ejection fraction was in the range of 20% to 25%. Regional wall motion abnormalities: There is akinesis of the mid-distalanteroseptal myocardium. The study was not technically sufficient to allow evaluation of LV diastolic dysfunction due to atrial fibrillation. ------------------------------------------------------------ Aortic valve: Moderately thickened, mildly calcified leaflets. Sclerosis without stenosis. Doppler: No significant  regurgitation. ------------------------------------------------------------ Aorta: The aorta was normal, not dilated, and non-diseased. ------------------------------------------------------------ Mitral valve: Structurally normal valve. Leaflet separation was normal. Doppler: Transvalvular velocity was within the normal range. There was no evidence for stenosis. No regurgitation. Peak gradient: 2mm Hg (D). ------------------------------------------------------------ Left atrium: The atrium was moderately dilated. ------------------------------------------------------------ Right ventricle: The cavity size was normal. Wall thickness was normal. Pacer wire or catheter noted in right ventricle. Systolic function was normal. ------------------------------------------------------------ Pulmonic valve: Structurally normal valve. Cusp separation was normal. Doppler: Transvalvular velocity was within the normal range. No regurgitation. ----------------------------------------------------------- Tricuspid valve: Structurally normal valve. Leaflet separation was normal. Doppler: Transvalvular velocity was within the normal range. No regurgitation. ------------------------------------------------------------ Right atrium: The atrium was normal in size. ---------------------------------------------------------- Pericardium: The pericardium was normal in appearance. ------------------------------------------------------------  Device interrogation: performed yesterday by industry - normal BiV ICD function with good battery status and stable lead measurements; no VT/VF episodes; no programming changes made Telemetry:  Orthostatic VS:      Lying - BP 121/63 pulse 64      Sitting - BP 99/52 pulse 63      Standing - BP 112/66 pulse 122                        BP 121/42 pulse 71   Assessment and Plan  [name] is a 80 year old man with an ischemic CM, EF 20-25%, s/p CRT-D implant, chronic  systolic HF, HTN, chronic AF, coronary artery disease status post CABG, prior CVA and prostate cancer who was admitted with syncope.  1. Syncope - most consistent with orthostatic hypotension - meds adjusted yesterday - Imdur d/c'd and lisinopril dose decreased 2. Normal BiV ICD function without VT/VF episodes 3. Permanent AF 4. Chronic systolic HF 5. Ischemic CM s/p BiV ICD implant  Dr. [name] to see Signed, Exie Parody  EP Attending  Patient seen and examined. Agree with above. With no evidence of an arrhythmia on cardiac monitoring, he must have had orthostasis, likely related to multiple cardiac meds. Ok to discharge home once he is able to walk. From medication perspective, would tend to continue low dose beta blocker and ACE inhibitor. Avoid over diuresis and nitrates. He can followup with me in3-4 weeks. Leonia Reeves.D.

## 2013-02-17 NOTE — Progress Notes (Signed)
Patient has slept through most of the night without any discomfort or pain. Neuro checks done and negative. Currently patient complains of no pain or discomfort. Will continue to monitor to end of shift.

## 2013-02-17 NOTE — Care Management Note (Addendum)
  Page 1 of 1   02/17/2013     1:28:47 PM   CARE MANAGEMENT NOTE 02/17/2013  Patient:  Romack,Easton E   Account Number:  [id number]  Date Initiated:  02/17/2013  Documentation initiated by:  [facility]  Subjective/Objective Assessment:   79 y.o. male with Past medical history of diabetes mellitus, hypertension, ischemic cardiomyopathy status post ICD, coronary artery disease status post CABG, dyslipidemia, CVA, prostate cancer//home with daughter     Action/Plan:   As per the ED physician pacemaker interrogation done in the emergency room did not show any abnormality.// Home with [facility]   Anticipated DC Date:  02/17/2013   Anticipated DC Plan:  HOME W HOME HEALTH SERVICES      DC Planning Services  CM consult      Choice offered to / List presented to:  C-1 Patient        HH arranged  HH - 11 Patient Refused      Status of service:   Medicare Important Message given?   (If response is "NO", the following Medicare IM given date fields will be blank) Date Medicare IM given:   Date Additional Medicare IM given:    Discharge Disposition:    Per UR Regulation:    If discussed at Long Length of Stay Meetings, dates discussed:    Comments:  02/17/13 1030 Davena Julian, RN, BSN, [employer] [phone] I have recommended that this patient have a Home Health services but he declines at this time. I have discussed the risks and benefits of these services with him. The patient verbalizes understanding.

## 2013-02-17 NOTE — Discharge Summary (Signed)
[facility]                                                                                   [name], is a 79 y.o. male  DOB 1932-05-31  MRN [id number].  Admission date:  02/15/2013  Admitting Physician  Lynden Oxford, MD  Discharge Date:  02/17/2013   Primary MD  Pearson Grippe, MD  Recommendations for primary care physician for things to follow:   Monitor orthostatics adjust blood pressure medications as needed   Admission Diagnosis  Syncope [780.2]  Discharge Diagnosis   Orthostatic hypotension causing syncope  Principal Problem:   Syncope Active Problems:   Atrial fibrillation   ICD-St.Jude   Systolic CHF, chronic   S/P CABG (coronary artery bypass graft)   On home oxygen therapy      Past Medical History  Diagnosis Date  . Diabetes mellitus   . Hypertension   . Chronic systolic CHF (congestive heart failure)     a. 12/2012 Echo: EF 20-25%, mid-dist antsept AK, mod dil LA.  [name] CAD (coronary artery disease)     a. 07/2002 CABG x 3: LIMA->LAD, VG->Diag, VG->OM;  b. 06/2006 Cath: LM 50-60ost/p, LAD patent mid stent, D1 sev dzs, D2 patent stent, LCX nl, OM2 sev sten prox, RCA large/nl, VG->Diag nl, VG->OM nl, LIMA->LAD atretic, EF 30%.  . Ischemic cardiomyopathy     a. 06/2006 s/p SJM Promote RF, model 3207-36 BiV ICD, ser #: [id number].  [name] Hypercholesterolemia   . Black lung disease   . Nephrolithiasis 09/2000  . CVA (cerebral vascular accident)   . Prostate cancer 11/01/10    gleason 7, 8, 9, gold seeds 02/08/11  . Lung cancer   . COPD (chronic obstructive pulmonary disease)     a. On home O2.  [name] GERD (gastroesophageal reflux disease)   . Arthritis   . Gout   . Pacemaker     Past Surgical History  Procedure Laterality Date  . Colonoscopy    . Esophagogastroduodenoscopy  02/19/2011    Procedure: ESOPHAGOGASTRODUODENOSCOPY (EGD);  Surgeon: Petra Kuba, MD;  Location: [facility] ENDOSCOPY;  Service: Endoscopy;  Laterality: N/A;  . Upper endoscpopy    . Coronary  angioplasty with stent placement  07/1997; 08/1997;03/1998  . Coronary artery bypass graft  07/2002    CABG X3  . Incision and drainage of wound  08/2002    right thigh; S/P EVH  . Insert / replace / remove pacemaker  1979; 1992; 01/2000;  . Insert / replace / remove pacemaker  09/2003; 06/2006    w/AICD  . Insert / replace / remove pacemaker  12/2004    pacmaker explant  . Shoulder arthroscopy w/ rotator cuff repair  05/2008    left     Discharge Condition: Stable   Follow-up Information   Follow up with Pearson Grippe, MD. Schedule an appointment as soon as possible for a visit in 2 days.   Specialty:  Internal Medicine   Contact information:   [address] [place] [address] [phone]       Follow up with Lewayne Bunting, MD. Schedule an appointment as soon as possible for a  visit in 1 week.   Specialty:  Cardiology   Contact information:   [address] [place] [address] [phone]         Consults obtained -cardiology   Discharge Medications      Medication List    STOP taking these medications       isosorbide mononitrate 30 MG 24 hr tablet  Commonly known as:  IMDUR     metoprolol succinate 25 MG 24 hr tablet  Commonly known as:  TOPROL-XL     potassium chloride SA 20 MEQ tablet  Commonly known as:  K-DUR,KLOR-CON      TAKE these medications       albuterol (5 MG/ML) 0.5% nebulizer solution  Commonly known as:  PROVENTIL  Take 0.5 mLs (2.5 mg total) by nebulization every 6 (six) hours as needed for wheezing or shortness of breath.     allopurinol 300 MG tablet  Commonly known as:  ZYLOPRIM  Take 300 mg by mouth daily.     atorvastatin 20 MG tablet  Commonly known as:  LIPITOR  Take 20 mg by mouth daily.     CALCIUM-VITAMIN D PO  Take 1 tablet by mouth daily.     digoxin 0.125 MG tablet  Commonly known as:  LANOXIN  Take 125 mcg by mouth daily.     furosemide 20 MG tablet  Commonly known as:  LASIX   Take 1 tablet (20 mg total) by mouth daily.     lisinopril 2.5 MG tablet  Commonly known as:  PRINIVIL,ZESTRIL  Take 1 tablet (2.5 mg total) by mouth every morning.  Start taking on:  02/18/2013     loratadine 10 MG tablet  Commonly known as:  CLARITIN  Take 1 tablet (10 mg total) by mouth daily.     meclizine 12.5 MG tablet  Commonly known as:  ANTIVERT  Take 1 tablet (12.5 mg total) by mouth 2 (two) times daily as needed for dizziness.     metoprolol tartrate 25 MG tablet  Commonly known as:  LOPRESSOR  Take 0.5 tablets (12.5 mg total) by mouth 2 (two) times daily.     midodrine 5 MG tablet  Commonly known as:  PROAMATINE  Take 1 tablet (5 mg total) by mouth 3 (three) times daily with meals.     NITROSTAT 0.4 MG SL tablet  Generic drug:  nitroGLYCERIN  Place 0.4 mg under the tongue every 5 (five) minutes as needed for chest pain.     omeprazole 20 MG capsule  Commonly known as:  PRILOSEC  Take 20 mg by mouth every morning.     Rivaroxaban 15 MG Tabs tablet  Commonly known as:  XARELTO  Take 15 mg by mouth daily with supper.     VITAMIN B-12 PO  Take 1 tablet by mouth 2 (two) times daily.         Diet and Activity recommendation: See Discharge Instructions below   Discharge Instructions     Follow with Primary MD Pearson Grippe, MD in 2 days   Get CBC, CMP, orthostatics checked 2 days by Primary MD and again as instructed by your Primary MD.     Bonita Quin must sit at a stationary position for 5 minutes and do leg extension exercises as taught for 5 minutes before you start walking from a resting position or after getting up from a bed, once you stand up , stand at that spot for 3-5 minutes while holding on to  a wall-bed-heavy furniture and then walk only if you are not dizzy, using a  walker at all times, if you still get dizzy sit down, and call for help.  Get Medicines reviewed and adjusted.  Please request your Prim.MD to go over all Hospital Tests and  Procedure/Radiological results at the follow up, please get all Hospital records sent to your Prim MD by signing hospital release before you go home.  Activity: As tolerated with Full fall precautions use walker/cane & assistance as needed   Diet:  Heart Healthy  For Heart failure patients - Check your Weight same time everyday, if you gain over 2 pounds, or you develop in leg swelling, experience more shortness of breath or chest pain, call your Primary MD immediately. Follow Cardiac Low Salt Diet and 1.8 lit/day fluid restriction.  Disposition Home    If you experience worsening of your admission symptoms, develop shortness of breath, life threatening emergency, suicidal or homicidal thoughts you must seek medical attention immediately by calling 911 or calling your MD immediately  if symptoms less severe.  You Must read complete instructions/literature along with all the possible adverse reactions/side effects for all the Medicines you take and that have been prescribed to you. Take any new Medicines after you have completely understood and accpet all the possible adverse reactions/side effects.   Do not drive and provide baby sitting services if your were admitted for syncope or siezures until you have seen by Primary MD or a Neurologist and advised to do so again.  Do not drive when taking Pain medications.    Do not take more than prescribed Pain, Sleep and Anxiety Medications  Special Instructions: If you have smoked or chewed Tobacco  in the last 2 yrs please stop smoking, stop any regular Alcohol  and or any Recreational drug use.  Wear Seat belts while driving.   Please note  You were cared for by a hospitalist during your hospital stay. If you have any questions about your discharge medications or the care you received while you were in the hospital after you are discharged, you can call the unit and asked to speak with the hospitalist on call if the hospitalist that took care  of you is not available. Once you are discharged, your primary care physician will handle any further medical issues. Please note that NO REFILLS for any discharge medications will be authorized once you are discharged, as it is imperative that you return to your primary care physician (or establish a relationship with a primary care physician if you do not have one) for your aftercare needs so that they can reassess your need for medications and monitor your lab values.     Major procedures and Radiology Reports - PLEASE review detailed and final reports for all details, in brief -       Dg Chest 2 View  02/15/2013   CLINICAL DATA:  Larey Seat.  Chest pain.  EXAM: CHEST  2 VIEW  COMPARISON:  12/16/2012.  FINDINGS: The pacer wires/ AICD are stable. The heart is enlarged but unchanged. The mediastinal and hilar contours are within normal limits and stable. Stable surgical changes related to bypass surgery. There are chronic bronchitic type interstitial lung changes and interstitial scarring. No infiltrates, edema or effusions. Mild chronic pleural scarring changes on the left and associated calcifications.  IMPRESSION: Chronic lung changes and mild cardiac enlargement but no definite acute overlying pulmonary process.   Electronically Signed   By: Luan Pulling.D.  On: 02/15/2013 17:23   Dg Pelvis 1-2 Views  02/15/2013   CLINICAL DATA:  Larey Seat.  Left buttock pain.  EXAM: PELVIS - 1-2 VIEW  COMPARISON:  None.  FINDINGS: Both hips are normally located. No acute hip fracture. The pubic symphysis and SI joints are intact. No pelvic fractures.  IMPRESSION: No acute bony findings.   Electronically Signed   By: Loralie Champagne M.D.   On: 02/15/2013 17:26   Ct Head Wo Contrast  02/15/2013   CLINICAL DATA:  Two episodes of syncope; status post falls. Concern for head or cervical spine injury.  EXAM: CT HEAD WITHOUT CONTRAST  CT CERVICAL SPINE WITHOUT CONTRAST  TECHNIQUE: Multidetector CT imaging of the head and  cervical spine was performed following the standard protocol without intravenous contrast. Multiplanar CT image reconstructions of the cervical spine were also generated.  COMPARISON:  CT of the head performed 07/31/2012, and CT of the cervical spine performed 12/21/2011  FINDINGS: CT HEAD FINDINGS  There is no evidence of acute infarction, mass lesion, or intra- or extra-axial hemorrhage on CT.  Prominence of the ventricles and sulci reflects mild to moderate cortical volume loss. Mild subcortical white matter change likely reflects small vessel ischemic microangiopathy. Cerebellar atrophy is noted.  The brainstem and fourth ventricle are within normal limits. The basal ganglia are unremarkable in appearance. The cerebral hemispheres demonstrate grossly normal gray-white differentiation. No mass effect or midline shift is seen.  There is no evidence of fracture; visualized osseous structures are unremarkable in appearance. The orbits are within normal limits. The paranasal sinuses and mastoid air cells are well-aerated. No significant soft tissue abnormalities are seen.  CT CERVICAL SPINE FINDINGS  There is no evidence of fracture or subluxation. Vertebral bodies demonstrate normal height and alignment. There is mild multilevel disc space narrowing along the lower cervical spine. Prominent lateral osteophytes are seen at the lower cervical spine, and mild underlying facet disease is seen. Prevertebral soft tissues are within normal limits. The visualized neural foramina are grossly unremarkable.  The thyroid gland is unremarkable in appearance. The visualized lung apices are clear. Dense calcification is noted at the carotid bifurcations bilaterally, worse on the right. A 1.8 cm peripherally calcified mass is noted at the superior aspect of the left parotid gland; this is grossly stable from 2013 and likely benign.  IMPRESSION: 1. No evidence of traumatic intracranial injury or fracture. 2. No evidence of fracture  or subluxation along the cervical spine. 3. Mild to moderate cortical volume loss and scattered small vessel ischemic microangiopathy. 4. Dense calcification at the carotid bifurcations bilaterally, worse on the right. On the prior carotid ultrasound from 2013, there was moderate right-sided and mild left-sided carotid stenosis. 5. Mild degenerative change noted along the cervical spine.   Electronically Signed   By: Roanna Raider M.D.   On: 02/15/2013 21:57   Ct Cervical Spine Wo Contrast  02/15/2013   CLINICAL DATA:  Two episodes of syncope; status post falls. Concern for head or cervical spine injury.  EXAM: CT HEAD WITHOUT CONTRAST  CT CERVICAL SPINE WITHOUT CONTRAST  TECHNIQUE: Multidetector CT imaging of the head and cervical spine was performed following the standard protocol without intravenous contrast. Multiplanar CT image reconstructions of the cervical spine were also generated.  COMPARISON:  CT of the head performed 07/31/2012, and CT of the cervical spine performed 12/21/2011  FINDINGS: CT HEAD FINDINGS  There is no evidence of acute infarction, mass lesion, or intra- or extra-axial hemorrhage on CT.  Prominence of the ventricles and sulci reflects mild to moderate cortical volume loss. Mild subcortical white matter change likely reflects small vessel ischemic microangiopathy. Cerebellar atrophy is noted.  The brainstem and fourth ventricle are within normal limits. The basal ganglia are unremarkable in appearance. The cerebral hemispheres demonstrate grossly normal gray-white differentiation. No mass effect or midline shift is seen.  There is no evidence of fracture; visualized osseous structures are unremarkable in appearance. The orbits are within normal limits. The paranasal sinuses and mastoid air cells are well-aerated. No significant soft tissue abnormalities are seen.  CT CERVICAL SPINE FINDINGS  There is no evidence of fracture or subluxation. Vertebral bodies demonstrate normal height and  alignment. There is mild multilevel disc space narrowing along the lower cervical spine. Prominent lateral osteophytes are seen at the lower cervical spine, and mild underlying facet disease is seen. Prevertebral soft tissues are within normal limits. The visualized neural foramina are grossly unremarkable.  The thyroid gland is unremarkable in appearance. The visualized lung apices are clear. Dense calcification is noted at the carotid bifurcations bilaterally, worse on the right. A 1.8 cm peripherally calcified mass is noted at the superior aspect of the left parotid gland; this is grossly stable from 2013 and likely benign.  IMPRESSION: 1. No evidence of traumatic intracranial injury or fracture. 2. No evidence of fracture or subluxation along the cervical spine. 3. Mild to moderate cortical volume loss and scattered small vessel ischemic microangiopathy. 4. Dense calcification at the carotid bifurcations bilaterally, worse on the right. On the prior carotid ultrasound from 2013, there was moderate right-sided and mild left-sided carotid stenosis. 5. Mild degenerative change noted along the cervical spine.   Electronically Signed   By: Roanna Raider M.D.   On: 02/15/2013 21:57    Micro Results      Recent Results (from the past 240 hour(s))  MRSA PCR SCREENING     Status: None   Collection Time    02/15/13 10:14 PM      Result Value Range Status   MRSA by PCR NEGATIVE  NEGATIVE Final   Comment:            The GeneXpert MRSA Assay (FDA     approved for NASAL specimens     only), is one component of a     comprehensive MRSA colonization     surveillance program. It is not     intended to diagnose MRSA     infection nor to guide or     monitor treatment for     MRSA infections.     History of present illness and  Hospital Course:     Kindly see H&P for history of present illness and admission details, please review complete Labs, Consult reports and Test reports for all details in brief  [name], is a 79 y.o. male, patient with history of   diabetes mellitus, hypertension, ischemic cardiomyopathy with chronic systolic heart failure EF 20-25% status post ICD, chronic atrial fibrillation on anticoagulation with xaralto, coronary artery disease status post CABG, dyslipidemia, CVA, prostate cancer.    Patient with above history who lives at home with his daughter was brought in after 2 syncopal episodes at home, due to his history of ischemic cardiomyopathy with chronic systolic heart failure EF of 25% status post AICD he was admitted on a telemetry bed, he remained stable on telemetry except for few PVCs and bigeminy, he was seen by cardiology, his AICD was interrogated and no dysrhythmia  was found. Upon further workup he was found to be orthostatic, he received gentle IV fluids, his blood pressure medications were adjusted by cardiology mainly his Imdur was discontinued and his lisinopril was lowered. He has been started on midodrine and TED stockings, he is symptom free now and he will get home PT and RN upon discharge. He has been advised to closely follow with his PCP and primary cardiologist Dr. [name] post discharge. Note due to his fall he had a head CT which was unremarkable. His balance issues continue in the future long-term anticoagulation should be reconsidered in view of risks and was benefits.    History of chronic atrial fibrillation, continue digoxin levels were stable, beta blocker and adjusted by cardiology, continue on xaralto. Long-term and admission will be deferred to PCP and primary cardiologist    History of dyslipidemia, COPD on home oxygen, prostate and lung cancer in the past. Other home medications will be continued unchanged we'll follow with PCP post discharge. No acute issues from these standpoint.      Today   Subjective:   [name] today has no headache,no chest abdominal pain,no new weakness tingling or numbness, feels much better wants  to go home today.    Objective:   Blood pressure 105/65, pulse 61, temperature 97.9 F (36.6 C), temperature source Oral, resp. rate 20, height 5\' 6"  (1.676 m), weight 59.92 kg (132 lb 1.6 oz), SpO2 100.00%.   Intake/Output Summary (Last 24 hours) at 02/17/13 1108 Last data filed at 02/17/13 0904  Gross per 24 hour  Intake    603 ml  Output   1625 ml  Net  -1022 ml    Exam Awake Alert, Oriented *3, No new F.N deficits, Normal affect [place].AT,PERRAL Supple Neck,No JVD, No cervical lymphadenopathy appriciated.  Symmetrical Chest wall movement, Good air movement bilaterally, CTAB RRR,No Gallops,Rubs or new Murmurs, No Parasternal Heave +ve B.Sounds, Abd Soft, Non tender, No organomegaly appriciated, No rebound -guarding or rigidity. No Cyanosis, Clubbing or edema, No new Rash or bruise  Data Review   CBC w Diff: Lab Results  Component Value Date   WBC 3.6* 02/16/2013   HGB 9.4* 02/16/2013   HCT 28.4* 02/16/2013   PLT 156 02/16/2013   LYMPHOPCT 16 12/16/2012   MONOPCT 6 12/16/2012   EOSPCT 1 12/16/2012   BASOPCT 0 12/16/2012    CMP: Lab Results  Component Value Date   NA 137 02/16/2013   K 4.2 02/16/2013   CL 99 02/16/2013   CO2 28 02/16/2013   BUN 22 02/16/2013   CREATININE 1.12 02/16/2013   PROT 7.2 02/16/2013   ALBUMIN 3.3* 02/16/2013   BILITOT 0.5 02/16/2013   ALKPHOS 88 02/16/2013   AST 18 02/16/2013   ALT 19 02/16/2013  .   Total Time in preparing paper work, data evaluation and todays exam - 35 minutes  Leroy Sea M.D on 02/17/2013 at 11:08 AM  [facility] Office  [phone]

## 2013-02-17 NOTE — Progress Notes (Signed)
Patient's IV and telemetry has been discontinued; patient's daughter verbalizes understanding of discharge instructions and the patient is being discharged home with daughter. Lorretta Harp RN

## 2013-02-17 NOTE — Progress Notes (Signed)
Seen and agree with SPT note [name] [name], PT [phone]  

## 2013-02-19 ENCOUNTER — Other Ambulatory Visit: Payer: Self-pay | Admitting: Internal Medicine

## 2013-02-19 DIAGNOSIS — R55 Syncope and collapse: Secondary | ICD-10-CM

## 2013-02-24 ENCOUNTER — Telehealth: Payer: Self-pay | Admitting: Internal Medicine

## 2013-02-24 ENCOUNTER — Encounter: Payer: PRIVATE HEALTH INSURANCE | Admitting: Cardiology

## 2013-02-24 NOTE — Telephone Encounter (Signed)
New Message  Pt daughter called--- wanted to know if Pt needs the phys defib check today being that he was seen 10/28th. She doesn't want him to come out into the cold if the appt is not needed.. She requests a call back to discuss.

## 2013-02-24 NOTE — Telephone Encounter (Signed)
Daughter called and says she wants to cancel appointment for today.  His issue was due to his BP when he passed out not his device.  She is going to send in a transmission as scheduled on 04/09/13.  She will call back if anything changes

## 2013-03-19 ENCOUNTER — Ambulatory Visit
Admission: RE | Admit: 2013-03-19 | Discharge: 2013-03-19 | Disposition: A | Discharge status: Home / Self Care | Payer: PRIVATE HEALTH INSURANCE | Source: Ambulatory Visit | Attending: Internal Medicine | Admitting: Internal Medicine

## 2013-03-19 DIAGNOSIS — R55 Syncope and collapse: Secondary | ICD-10-CM

## 2013-03-27 ENCOUNTER — Other Ambulatory Visit: Payer: Self-pay | Admitting: Internal Medicine

## 2013-03-27 DIAGNOSIS — I6529 Occlusion and stenosis of unspecified carotid artery: Secondary | ICD-10-CM

## 2013-04-09 ENCOUNTER — Encounter: Payer: PRIVATE HEALTH INSURANCE | Admitting: *Deleted

## 2013-04-17 ENCOUNTER — Encounter: Payer: Self-pay | Admitting: *Deleted

## 2013-05-01 ENCOUNTER — Encounter: Payer: Self-pay | Admitting: Vascular Surgery

## 2013-05-08 ENCOUNTER — Ambulatory Visit (INDEPENDENT_AMBULATORY_CARE_PROVIDER_SITE_OTHER): Payer: PRIVATE HEALTH INSURANCE | Admitting: *Deleted

## 2013-05-08 DIAGNOSIS — I509 Heart failure, unspecified: Secondary | ICD-10-CM

## 2013-05-08 DIAGNOSIS — I5022 Chronic systolic (congestive) heart failure: Secondary | ICD-10-CM

## 2013-05-08 DIAGNOSIS — I459 Conduction disorder, unspecified: Secondary | ICD-10-CM

## 2013-05-08 DIAGNOSIS — Z95810 Presence of automatic (implantable) cardiac defibrillator: Secondary | ICD-10-CM

## 2013-05-08 LAB — MDC_IDC_ENUM_SESS_TYPE_INCLINIC
Battery Remaining Longevity: 14.4 mo
Battery Voltage: 2.54 V
Brady Statistic RA Percent Paced: 0 %
Brady Statistic RV Percent Paced: 98 %
HighPow Impedance: 39 Ohm
Lead Channel Impedance Value: 487.5 Ohm
Lead Channel Pacing Threshold Amplitude: 0.75 V
Lead Channel Pacing Threshold Amplitude: 0.75 V
Lead Channel Pacing Threshold Pulse Width: 0.5 ms
Lead Channel Pacing Threshold Pulse Width: 0.5 ms
Lead Channel Setting Pacing Amplitude: 2.5 V
Lead Channel Setting Pacing Pulse Width: 0.5 ms
Lead Channel Setting Pacing Pulse Width: 0.5 ms
Lead Channel Setting Sensing Sensitivity: 2 mV
MDC IDC MSMT LEADCHNL LV IMPEDANCE VALUE: 362.5 Ohm
MDC IDC MSMT LEADCHNL LV PACING THRESHOLD AMPLITUDE: 1 V
MDC IDC MSMT LEADCHNL LV PACING THRESHOLD AMPLITUDE: 1 V
MDC IDC MSMT LEADCHNL LV PACING THRESHOLD PULSEWIDTH: 0.5 ms
MDC IDC MSMT LEADCHNL LV PACING THRESHOLD PULSEWIDTH: 0.5 ms
MDC IDC MSMT LEADCHNL RV SENSING INTR AMPL: 11.6 mV
MDC IDC PG SERIAL: 442538
MDC IDC SESS DTM: 20150227103312
MDC IDC SET LEADCHNL LV PACING AMPLITUDE: 2.5 V
MDC IDC SET ZONE DETECTION INTERVAL: 300 ms
MDC IDC SET ZONE DETECTION INTERVAL: 375 ms
Zone Setting Detection Interval: 250 ms

## 2013-05-08 NOTE — Progress Notes (Signed)
CRT-D device check in office. Thresholds and sensing consistent with previous device measurements. Lead impedance trends stable over time. No ventricular arrhythmia episodes recorded. Patient bi-ventricularly pacing 98% of the time. Device programmed with appropriate safety margins. No changes made this session. Estimated longevity 1.91yrs. Pt attempted Merlin, prefers OV.   ROV w/ device clinic 08/10/13.

## 2013-05-14 ENCOUNTER — Encounter: Payer: Self-pay | Admitting: Internal Medicine

## 2013-05-25 ENCOUNTER — Encounter: Payer: Self-pay | Admitting: Vascular Surgery

## 2013-05-26 ENCOUNTER — Encounter: Payer: Self-pay | Admitting: Vascular Surgery

## 2013-05-26 ENCOUNTER — Ambulatory Visit (INDEPENDENT_AMBULATORY_CARE_PROVIDER_SITE_OTHER): Payer: PRIVATE HEALTH INSURANCE | Admitting: Vascular Surgery

## 2013-05-26 ENCOUNTER — Telehealth: Payer: Self-pay | Admitting: Vascular Surgery

## 2013-05-26 VITALS — BP 132/74 | HR 66 | Resp 18 | Ht 66.0 in | Wt 136.9 lb

## 2013-05-26 DIAGNOSIS — I6529 Occlusion and stenosis of unspecified carotid artery: Secondary | ICD-10-CM

## 2013-05-26 DIAGNOSIS — Z01810 Encounter for preprocedural cardiovascular examination: Secondary | ICD-10-CM

## 2013-05-26 NOTE — Telephone Encounter (Signed)
Spoke with daughter [name]. Pt authorized me to contact her regarding follow up appointment. Gave her all info including:  Lab on 3/23 or 3/24 - Order faxed to lab - CTA on 05/25/13 8:15 pm no food 4 hrs prior and TFE appoinment on 06/09/13 at 8:30 am. Daughter verbalized understanding.

## 2013-05-26 NOTE — Progress Notes (Signed)
Patient name: [name] MRN: [id number] DOB: 07-Jun-1932 Sex: male   Referred by: Maudie Mercury  Reason for referral:  Chief Complaint  Patient presents with  . New Evaluation    worsening carotid stenosis and syncopal episodes  . Carotid    HISTORY OF PRESENT ILLNESS: Patient seen today for evaluation of extracranial injury vascular occlusive disease. He is a very pleasant 80 year old gentleman with a complex past history. He has had coronary bypass grafting roughly 10 years ago and has had multiple pacemaker insertions. He reports that he is a prior study showing old strokes but is not specifically remember any focal neurologic deficits. He has had multiple traumas with concussions that were work related. He does report some soreness in his left neck extending posterior to see her in a to is a temporal area. At this is not related to carotid blockage.  Past Medical History  Diagnosis Date  . Diabetes mellitus   . Hypertension   . Chronic systolic CHF (congestive heart failure)     a. 12/2012 Echo: EF 20-25%, mid-dist antsept AK, mod dil LA.  [name] CAD (coronary artery disease)     a. 07/2002 CABG x 3: LIMA->LAD, VG->Diag, VG->OM;  b. 06/2006 Cath: LM 50-60ost/p, LAD patent mid stent, D1 sev dzs, D2 patent stent, LCX nl, OM2 sev sten prox, RCA large/nl, VG->Diag nl, VG->OM nl, LIMA->LAD atretic, EF 30%.  . Ischemic cardiomyopathy     a. 06/2006 s/p SJM Promote RF, model 3207-36 BiV ICD, ser #: [id number].  [name] Hypercholesterolemia   . Black lung disease   . Nephrolithiasis 09/2000  . CVA (cerebral vascular accident)   . Prostate cancer 11/01/10    gleason 7, 8, 9, gold seeds 02/08/11  . Lung cancer   . COPD (chronic obstructive pulmonary disease)     a. On home O2.  [name] GERD (gastroesophageal reflux disease)   . Arthritis   . Gout   . Pacemaker   . Carotid artery occlusion     Past Surgical History  Procedure Laterality Date  . Colonoscopy    . Esophagogastroduodenoscopy  02/19/2011   Procedure: ESOPHAGOGASTRODUODENOSCOPY (EGD);  Surgeon: Jeryl Columbia, MD;  Location: [facility] ENDOSCOPY;  Service: Endoscopy;  Laterality: N/A;  . Upper endoscpopy    . Coronary angioplasty with stent placement  07/1997; 08/1997;03/1998  . Coronary artery bypass graft  07/2002    CABG X3  . Incision and drainage of wound  08/2002    right thigh; S/P EVH  . Insert / replace / remove pacemaker  1979; 1992; 01/2000;  . Insert / replace / remove pacemaker  09/2003; 06/2006    w/AICD  . Insert / replace / remove pacemaker  12/2004    pacmaker explant  . Shoulder arthroscopy w/ rotator cuff repair  05/2008    left    History   Social History  . Marital Status: Widowed    Spouse Name: N/A    Number of Children: N/A  . Years of Education: N/A   Occupational History  . retired     Equities trader   Social History Main Topics  . Smoking status: Former Smoker    Types: Cigarettes    Quit date: 03/12/1949  . Smokeless tobacco: Former Systems developer    Types: Chew  . Alcohol Use: No     Comment: drank until per pt 03/19/11  . Drug Use: No  . Sexual Activity: Yes   Other Topics Concern  . Not on file  Social History Narrative  . No narrative on file    Family History  Problem Relation Age of Onset  . Alzheimer's disease Father 63  . Cancer Father 23    metastatic prostate cancer  . Diabetes Sister 74  . Diabetes Brother   . Diabetes Brother   . Diabetes Brother   . Hypotension Neg Hx   . Malignant hyperthermia Neg Hx   . Pseudochol deficiency Neg Hx     Allergies as of 05/26/2013 - Review Complete 05/26/2013  Allergen Reaction Noted  . Aspirin Hives     Current Outpatient Prescriptions on File Prior to Visit  Medication Sig Dispense Refill  . albuterol (PROVENTIL) (5 MG/ML) 0.5% nebulizer solution Take 0.5 mLs (2.5 mg total) by nebulization every 6 (six) hours as needed for wheezing or shortness of breath.  20 mL  5  . allopurinol (ZYLOPRIM) 300 MG tablet Take 300 mg by mouth daily.        [name]  atorvastatin (LIPITOR) 20 MG tablet Take 20 mg by mouth daily.        [name] CALCIUM-VITAMIN D PO Take 1 tablet by mouth daily.       . Cyanocobalamin (VITAMIN B-12 PO) Take 1 tablet by mouth 2 (two) times daily.       . digoxin (LANOXIN) 0.125 MG tablet Take 125 mcg by mouth daily.        . furosemide (LASIX) 20 MG tablet Take 1 tablet (20 mg total) by mouth daily.  30 tablet  5  . lisinopril (PRINIVIL,ZESTRIL) 2.5 MG tablet Take 1 tablet (2.5 mg total) by mouth every morning.  30 tablet  0  . loratadine (CLARITIN) 10 MG tablet Take 1 tablet (10 mg total) by mouth daily.  30 tablet  6  . meclizine (ANTIVERT) 12.5 MG tablet Take 1 tablet (12.5 mg total) by mouth 2 (two) times daily as needed for dizziness.  30 tablet  0  . metoprolol tartrate (LOPRESSOR) 25 MG tablet Take 0.5 tablets (12.5 mg total) by mouth 2 (two) times daily.  30 tablet  0  . midodrine (PROAMATINE) 5 MG tablet Take 1 tablet (5 mg total) by mouth 3 (three) times daily with meals.  90 tablet  0  . NITROSTAT 0.4 MG SL tablet Place 0.4 mg under the tongue every 5 (five) minutes as needed for chest pain.       [name] omeprazole (PRILOSEC) 20 MG capsule Take 20 mg by mouth every morning.       . Rivaroxaban (XARELTO) 15 MG TABS tablet Take 15 mg by mouth daily with supper.       No current facility-administered medications on file prior to visit.     REVIEW OF SYSTEMS:  Positives indicated with an "X"  CARDIOVASCULAR:  [ ]  chest pain   [ ]  chest pressure   [ ]  palpitations   [ ]  orthopnea   [ ]  dyspnea on exertion   [ ]  claudication   [ ]  rest pain   [ ]  DVT   [ ]  phlebitis PULMONARY:   [ ]  productive cough   [ ]  asthma   [ ]  wheezing NEUROLOGIC:   [ ]  weakness  [ ]  paresthesias  [ ]  aphasia  [ ]  amaurosis  [x ] dizziness HEMATOLOGIC:   [ ]  bleeding problems   [ ]  clotting disorders MUSCULOSKELETAL:  [ ]  joint pain   [ ]  joint swelling GASTROINTESTINAL: [ ]   blood in stool  [ ]   hematemesis GENITOURINARY:  [ x]  dysuria  [ ]    hematuria PSYCHIATRIC:  [ ]  history of major depression INTEGUMENTARY:  [ ]  rashes  [ ]  ulcers CONSTITUTIONAL:  [ ]  fever   [ ]  chills  PHYSICAL EXAMINATION:  General: The patient is a well-nourished male, in no acute distress. Vital signs are BP 132/74  Pulse 66  Resp 18  Ht 5\' 6"  (1.676 m)  Wt 136 lb 14.4 oz (62.097 kg)  BMI 22.11 kg/m2 Pulmonary: There is a good air exchange bilaterally without wheezing or rales. Abdomen: Soft and non-tender with normal pitch bowel sounds. Musculoskeletal: There are no major deformities.  There is no significant extremity pain. Neurologic: No focal weakness or paresthesias are detected, Skin: There are no ulcer or rashes noted. Psychiatric: The patient has normal affect. Cardiovascular: There is a regular rate and rhythm without significant murmur appreciated. Pulse status 2+ radial pulses bilaterally. Carotid arteries without bruits bilaterally  Vascular Lab Studies:  Carotid duplex from [facility] imaging November 2013 in January 2015 were reviewed this shows extensive plaque bilaterally with inability to determine level of stenosis.  Impression and Plan:  Bilateral carotid disease of unclear significance. Difficult to determine degree of stenosis related to duplex alone. Recommend CT angiogram for further evaluation. Discussed this at length with the patient and his daughter present to understand. We'll see him back in the opposite following CT angiogram bilateral carotid disease    TRUE Garciamartinez [facility] [place] Office: [phone]

## 2013-05-26 NOTE — Addendum Note (Signed)
Addended by: Mena Goes on: 05/26/2013 10:19 AM   Modules accepted: Orders

## 2013-05-26 NOTE — Addendum Note (Signed)
Addended by: Mena Goes on: 05/26/2013 10:11 AM   Modules accepted: Orders

## 2013-06-02 ENCOUNTER — Other Ambulatory Visit: Payer: Self-pay | Admitting: Vascular Surgery

## 2013-06-02 LAB — BUN: BUN: 14 mg/dL (ref 6–23)

## 2013-06-02 LAB — CREATININE, SERUM: CREATININE: 1.03 mg/dL (ref 0.50–1.35)

## 2013-06-04 ENCOUNTER — Ambulatory Visit
Admission: RE | Admit: 2013-06-04 | Discharge: 2013-06-04 | Disposition: A | Discharge status: Home / Self Care | Payer: PRIVATE HEALTH INSURANCE | Source: Ambulatory Visit | Attending: Vascular Surgery | Admitting: Vascular Surgery

## 2013-06-04 DIAGNOSIS — I6529 Occlusion and stenosis of unspecified carotid artery: Secondary | ICD-10-CM

## 2013-06-04 DIAGNOSIS — Z01810 Encounter for preprocedural cardiovascular examination: Secondary | ICD-10-CM

## 2013-06-04 MED ORDER — IOHEXOL 350 MG/ML SOLN
100.0000 mL | Freq: Once | INTRAVENOUS | Status: AC | PRN
  Administered 2013-06-04: 100 mL via INTRAVENOUS

## 2013-06-08 ENCOUNTER — Encounter: Payer: Self-pay | Admitting: Family

## 2013-06-08 ENCOUNTER — Encounter: Payer: Self-pay | Admitting: Vascular Surgery

## 2013-06-09 ENCOUNTER — Encounter: Payer: Self-pay | Admitting: Vascular Surgery

## 2013-06-09 ENCOUNTER — Ambulatory Visit (INDEPENDENT_AMBULATORY_CARE_PROVIDER_SITE_OTHER): Payer: 59 | Admitting: Vascular Surgery

## 2013-06-09 VITALS — BP 119/73 | HR 63 | Resp 18 | Ht 66.0 in | Wt 136.0 lb

## 2013-06-09 DIAGNOSIS — I6529 Occlusion and stenosis of unspecified carotid artery: Secondary | ICD-10-CM

## 2013-06-09 NOTE — Progress Notes (Signed)
Patient name: [name] MRN: [id number] DOB: Feb 09, 1933 Sex: male   Referred by: Maudie Mercury  Reason for referral:  Chief Complaint  Patient presents with  . Carotid    f/u  CT  06/04/13    HISTORY OF PRESENT ILLNESS: The patient is here today for discussion of recent CT angiogram for evaluation of extracranial cerebrovascular occlusive disease. I've seen several weeks ago with the duplex suggesting C. critical carotid disease. The duplex is inconclusive and so therefore he will underwent outpatient CT scan as needed. A for discussion this. His only complaint is of headache. He has no focal neurologic deficits. He is here today with her family  Past Medical History  Diagnosis Date  . Diabetes mellitus   . Hypertension   . Chronic systolic CHF (congestive heart failure)     a. 12/2012 Echo: EF 20-25%, mid-dist antsept AK, mod dil LA.  [name] CAD (coronary artery disease)     a. 07/2002 CABG x 3: LIMA->LAD, VG->Diag, VG->OM;  b. 06/2006 Cath: LM 50-60ost/p, LAD patent mid stent, D1 sev dzs, D2 patent stent, LCX nl, OM2 sev sten prox, RCA large/nl, VG->Diag nl, VG->OM nl, LIMA->LAD atretic, EF 30%.  . Ischemic cardiomyopathy     a. 06/2006 s/p SJM Promote RF, model 3207-36 BiV ICD, ser #: [id number].  [name] Hypercholesterolemia   . Black lung disease   . Nephrolithiasis 09/2000  . CVA (cerebral vascular accident)   . Prostate cancer 11/01/10    gleason 7, 8, 9, gold seeds 02/08/11  . Lung cancer   . COPD (chronic obstructive pulmonary disease)     a. On home O2.  [name] GERD (gastroesophageal reflux disease)   . Arthritis   . Gout   . Pacemaker   . Carotid artery occlusion     Past Surgical History  Procedure Laterality Date  . Colonoscopy    . Esophagogastroduodenoscopy  02/19/2011    Procedure: ESOPHAGOGASTRODUODENOSCOPY (EGD);  Surgeon: Jeryl Columbia, MD;  Location: [facility] ENDOSCOPY;  Service: Endoscopy;  Laterality: N/A;  . Upper endoscpopy    . Coronary angioplasty with stent placement   07/1997; 08/1997;03/1998  . Coronary artery bypass graft  07/2002    CABG X3  . Incision and drainage of wound  08/2002    right thigh; S/P EVH  . Insert / replace / remove pacemaker  1979; 1992; 01/2000;  . Insert / replace / remove pacemaker  09/2003; 06/2006    w/AICD  . Insert / replace / remove pacemaker  12/2004    pacmaker explant  . Shoulder arthroscopy w/ rotator cuff repair  05/2008    left    History   Social History  . Marital Status: Widowed    Spouse Name: N/A    Number of Children: N/A  . Years of Education: N/A   Occupational History  . retired     Equities trader   Social History Main Topics  . Smoking status: Former Smoker    Types: Cigarettes    Quit date: 03/12/1949  . Smokeless tobacco: Current User    Types: Chew  . Alcohol Use: No     Comment: drank until per pt 03/19/11  . Drug Use: No  . Sexual Activity: Yes   Other Topics Concern  . Not on file   Social History Narrative  . No narrative on file    Family History  Problem Relation Age of Onset  . Alzheimer's disease Father 35  . Cancer Father 19  metastatic prostate cancer  . Diabetes Sister 30  . Diabetes Brother   . Diabetes Brother   . Diabetes Brother   . Hypotension Neg Hx   . Malignant hyperthermia Neg Hx   . Pseudochol deficiency Neg Hx     Allergies as of 06/09/2013 - Review Complete 06/09/2013  Allergen Reaction Noted  . Aspirin Hives     Current Outpatient Prescriptions on File Prior to Visit  Medication Sig Dispense Refill  . albuterol (PROVENTIL) (5 MG/ML) 0.5% nebulizer solution Take 0.5 mLs (2.5 mg total) by nebulization every 6 (six) hours as needed for wheezing or shortness of breath.  20 mL  5  . allopurinol (ZYLOPRIM) 300 MG tablet Take 300 mg by mouth daily.        [name] atorvastatin (LIPITOR) 20 MG tablet Take 20 mg by mouth daily.        [name] CALCIUM-VITAMIN D PO Take 1 tablet by mouth daily.       . Cyanocobalamin (VITAMIN B-12 PO) Take 1 tablet by mouth 2 (two) times  daily.       . digoxin (LANOXIN) 0.125 MG tablet Take 125 mcg by mouth daily.        . furosemide (LASIX) 20 MG tablet Take 1 tablet (20 mg total) by mouth daily.  30 tablet  5  . lisinopril (PRINIVIL,ZESTRIL) 2.5 MG tablet Take 1 tablet (2.5 mg total) by mouth every morning.  30 tablet  0  . loratadine (CLARITIN) 10 MG tablet Take 1 tablet (10 mg total) by mouth daily.  30 tablet  6  . meclizine (ANTIVERT) 12.5 MG tablet Take 1 tablet (12.5 mg total) by mouth 2 (two) times daily as needed for dizziness.  30 tablet  0  . midodrine (PROAMATINE) 5 MG tablet Take 1 tablet (5 mg total) by mouth 3 (three) times daily with meals.  90 tablet  0  . NITROSTAT 0.4 MG SL tablet Place 0.4 mg under the tongue every 5 (five) minutes as needed for chest pain.       [name] omeprazole (PRILOSEC) 20 MG capsule Take 20 mg by mouth every morning.       . Rivaroxaban (XARELTO) 15 MG TABS tablet Take 15 mg by mouth daily with supper.      . metoprolol tartrate (LOPRESSOR) 25 MG tablet Take 0.5 tablets (12.5 mg total) by mouth 2 (two) times daily.  30 tablet  0   No current facility-administered medications on file prior to visit.      PHYSICAL EXAMINATION:  General: The patient is a well-nourished male, in no acute distress. Vital signs are BP 119/73  Pulse 63  Resp 18  Ht 5\' 6"  (1.676 m)  Wt 136 lb (61.689 kg)  BMI 21.96 kg/m2 Pulmonary: There is a good air exchange   Musculoskeletal: There are no major deformities.  There is no significant extremity pain. Neurologic: No focal weakness or paresthesias are detected, Skin: There are no ulcer or rashes noted. Psychiatric: The patient has normal affect. Cardiovascular:  2+ radial pulses bilaterally Carotid arteries without bruits bilaterally  CT angiogram was reviewed with the patient. This does show calcified plaque with no stenosis in the right internal carotid artery. He does have occlusion of his left internal carotid just distal to the  origin.  Impression and Plan:  Stable carotid anatomy. I discussed this at length with the patient and his family. I explained there is no treatment for his occluded internal carotid and a is no need  for further followup of this. Also explained in a widely patent right internal carotid artery with a calcified plaque and would recommend no further followup with [name] as well unless he had new symptoms. He was comfortable with this discussion will see [name] on an as-needed basis    Karol Skarzynski [facility] [place] Office: [phone]

## 2013-07-06 IMAGING — CT CT ORBITS W/O CM
3 of 4 series · 16 of 47 positions shown, 19 images · non-contrast
Comparison: CT 02/09/2011

CT HEAD

CLINICAL DATA: Fall, headache.  Right orbital swelling.

CT HEAD AND ORBITS WITHOUT CONTRAST
TECHNIQUE: Contiguous axial images were obtained from the base of
the skull through the vertex without contrast. Multidetector CT
imaging of the orbits was performed using the standard protocol
without intravenous contrast.

[Series 6: facial 2.0 h31s st · axial · 0.34mm/px · z∈[-219,-61]mm · 10 of 89 slices shown, 13 images]
[im 5/89  brain]
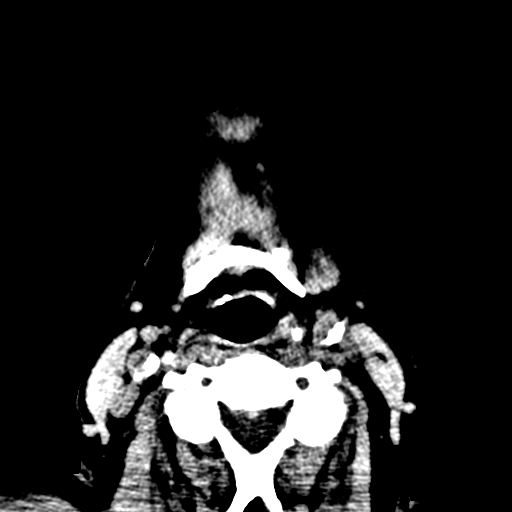
[im 5/89  bone]
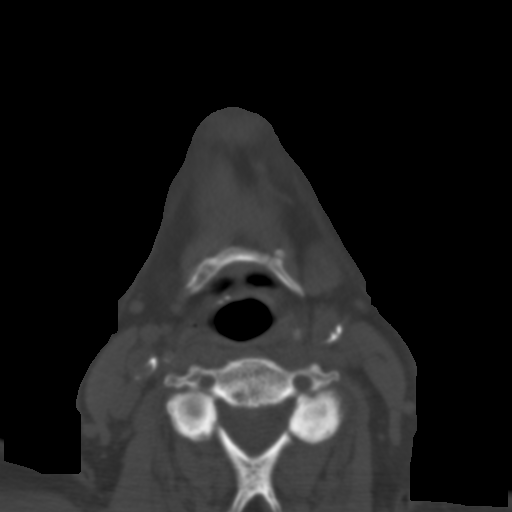
[im 14/89  bone]
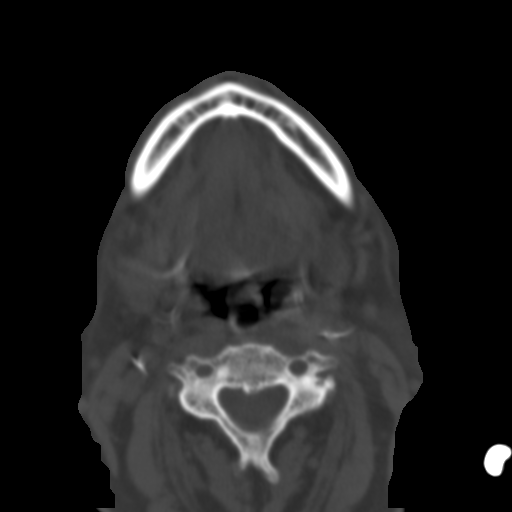
[im 23/89  bone]
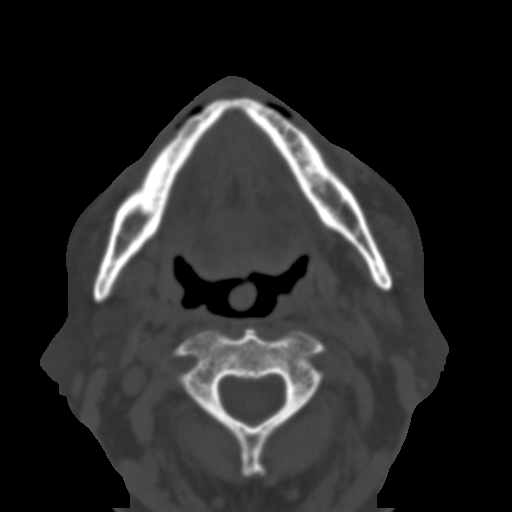
[im 31/89  bone]
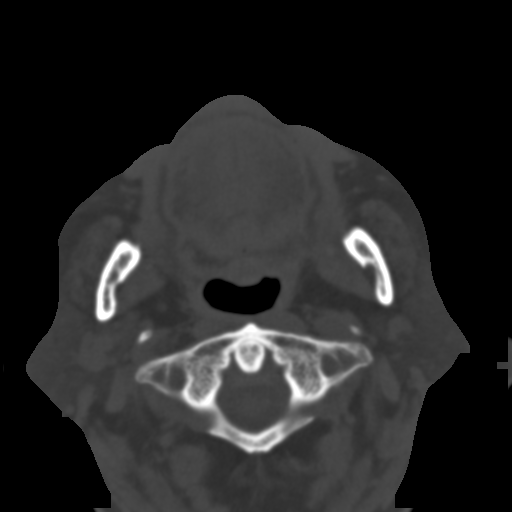
[im 40/89  brain]
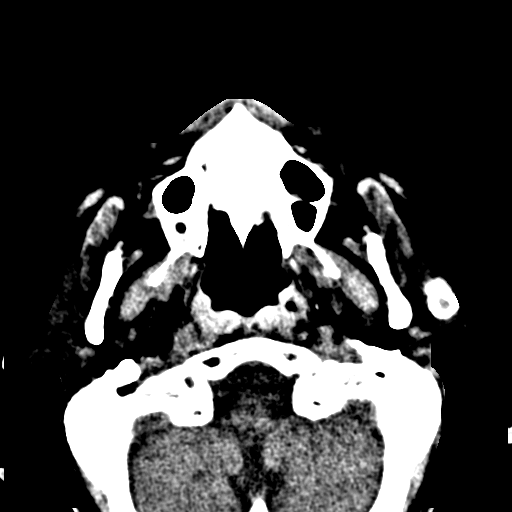
[im 40/89  bone]
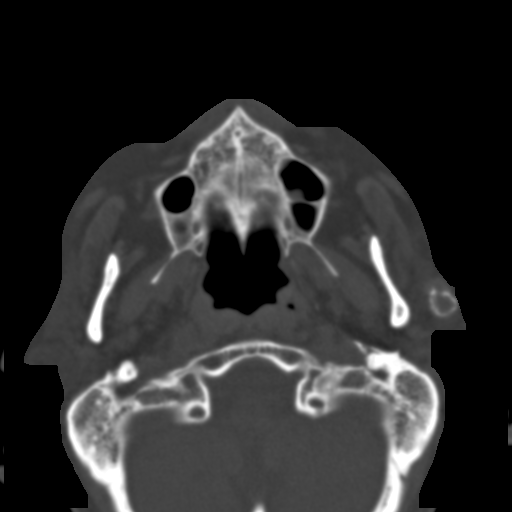
[im 49/89  bone]
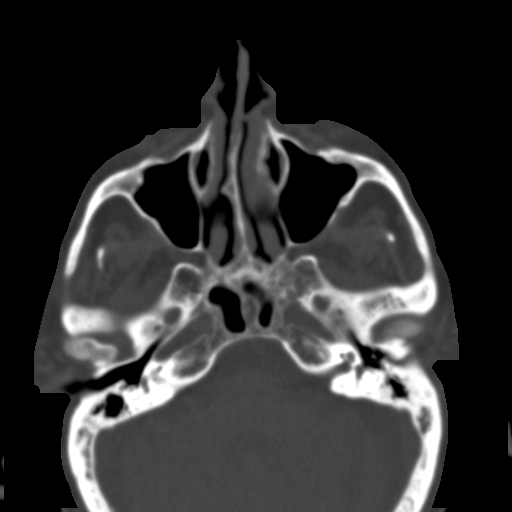
[im 58/89  bone]
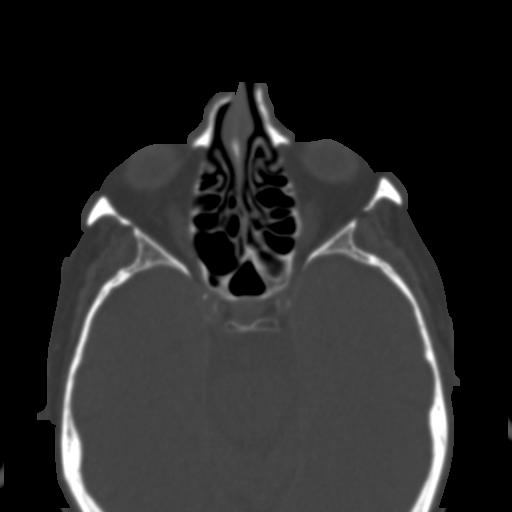
[im 67/89  bone]
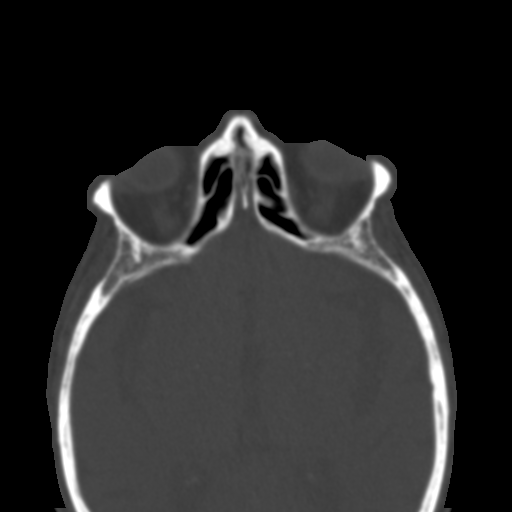
[im 75/89  brain]
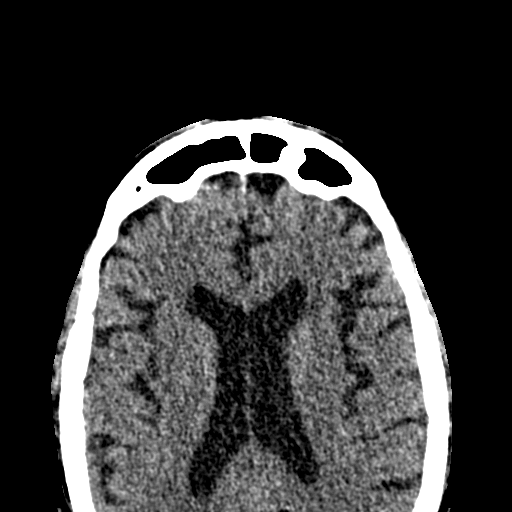
[im 75/89  bone]
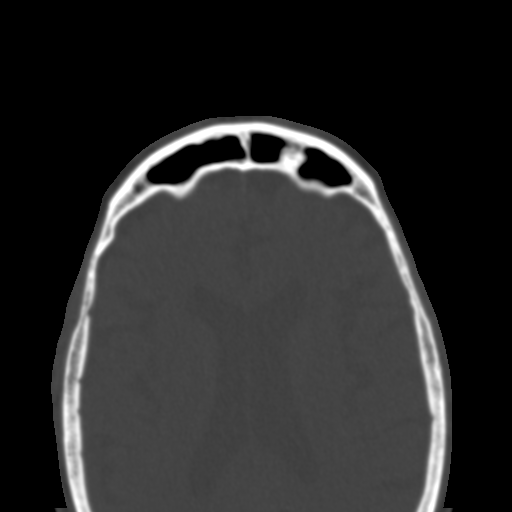
[im 84/89  bone]
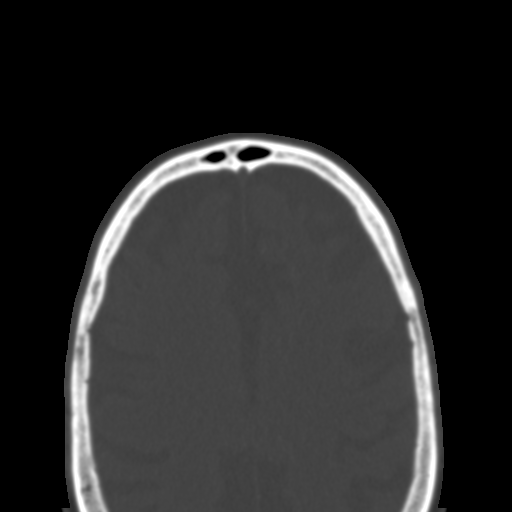

[Series 11: coronals · coronal · 0.36mm/px · 3 of 75 slices shown]
[im 25/75  bone]
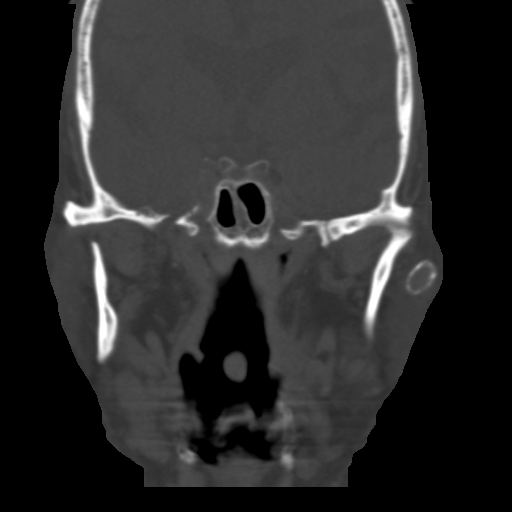
[im 33/75  bone]
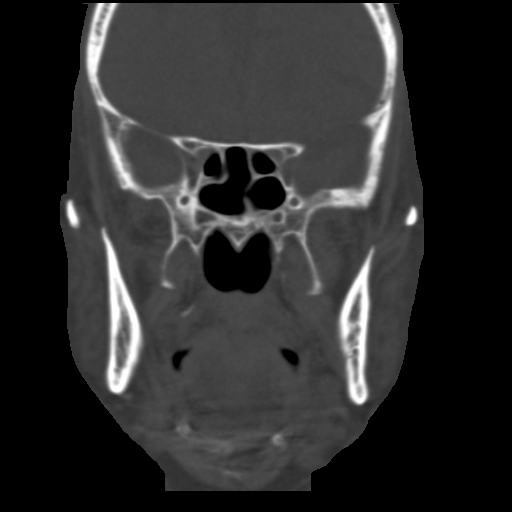
[im 42/75  bone]
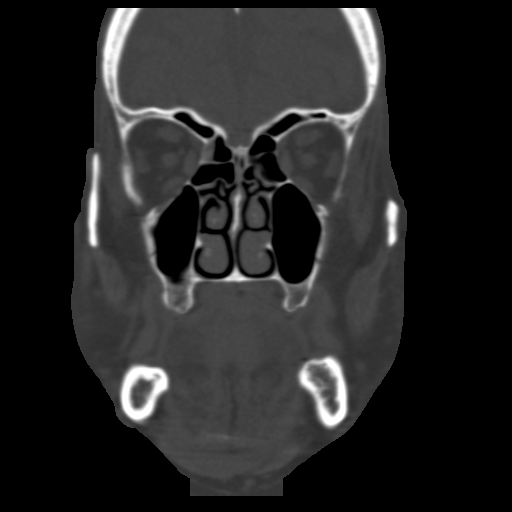

[Series 12: sagittals · sagittal · 0.36mm/px · 3 of 67 slices shown]
[im 23/67  bone]
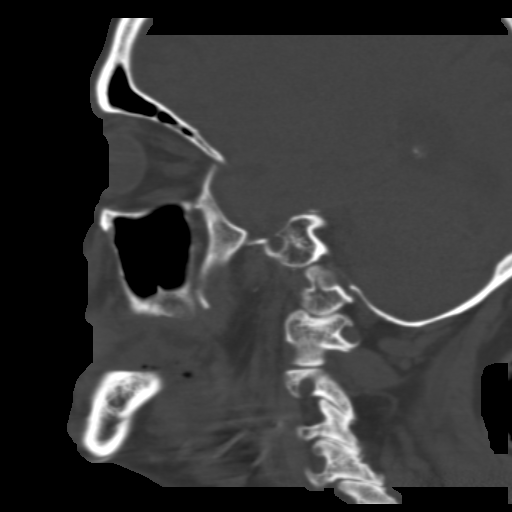
[im 34/67  bone]
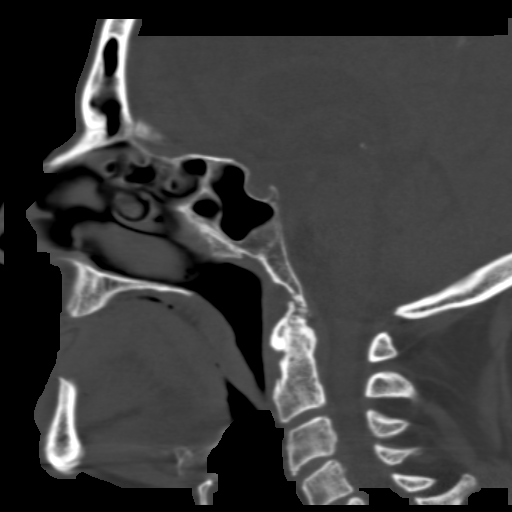
[im 45/67  bone]
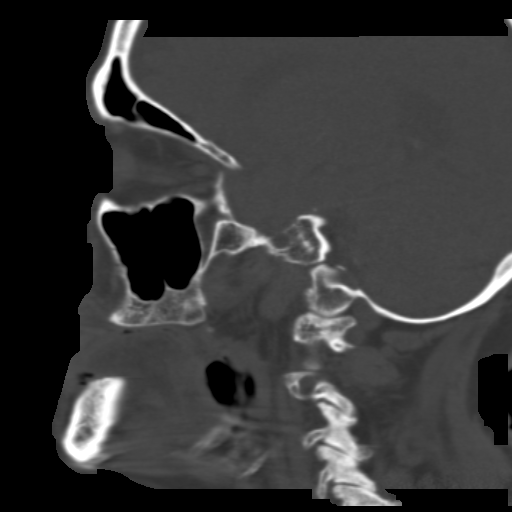

[16 of 47 positions shown; findings below may reference images not displayed]

FINDINGS: There is atrophy and chronic small vessel disease
changes. No acute intracranial abnormality.  Specifically, no
hemorrhage, hydrocephalus, mass lesion, acute infarction, or
significant intracranial injury.  No acute calvarial abnormality.
Visualized paranasal sinuses and mastoids clear.  Orbital soft
tissues unremarkable.

Angulation noted within the right nasal bones.  This is stable
dating back to 5835 compatible with old injury/fracture.
IMPRESSION: No acute intracranial abnormality.

Atrophy, chronic microvascular disease.

CT ORBITS
FINDINGS: Old nasal bone fracture with angulation on the right.
Slight soft tissue swelling over the right orbit.  No orbital
fracture.  Paranasal sinuses are clear.  Intraorbital soft tissues
unremarkable.
IMPRESSION: No acute orbital or facial fracture.

## 2013-07-21 IMAGING — CT CT CERVICAL SPINE W/O CM
4 of 5 series · 12 of 30 positions shown, 13 images · non-contrast
Comparison: 10/29/2008.

CLINICAL DATA: Fell 2 weeks ago.  Complains of neck pain.  The
patient reports syncope the head turning to the left.  History of
pacemaker.

CT CERVICAL SPINE WITHOUT CONTRAST
TECHNIQUE: Multidetector CT imaging of the cervical spine was
performed. Multiplanar CT image reconstructions were also
generated.

[Series 2: cervical spine · axial · 0.29mm/px · z∈[-190,-125]mm · 2 of 78 slices shown, 3 images]
[im 26/78  soft-tissue]
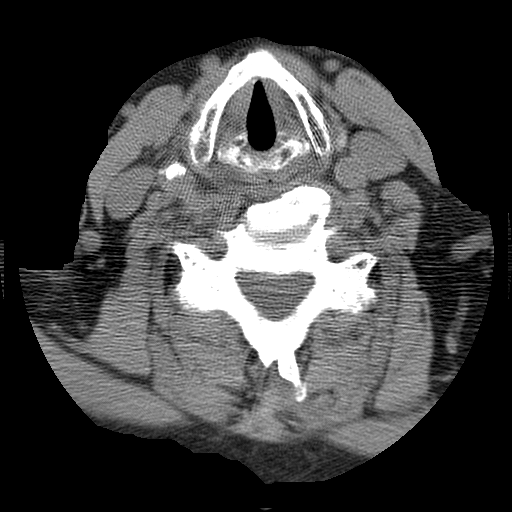
[im 26/78  bone]
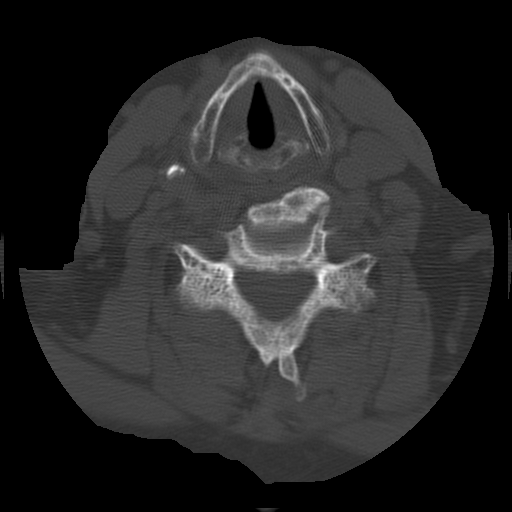
[im 52/78  bone]
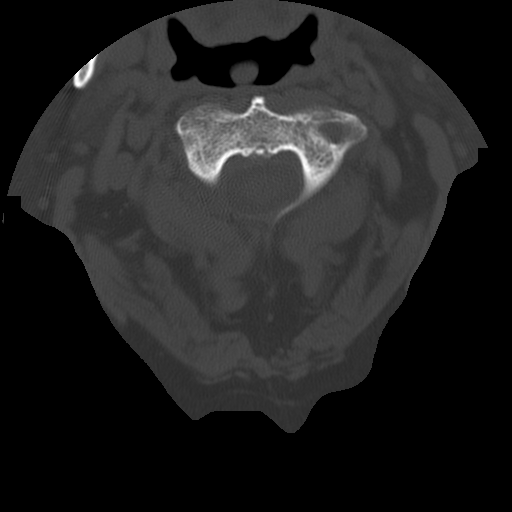

[Series 3: recon 2: cervical spine · axial · 0.29mm/px · z∈[-190,-125]mm · 2 of 78 slices shown]
[im 26/78  bone]
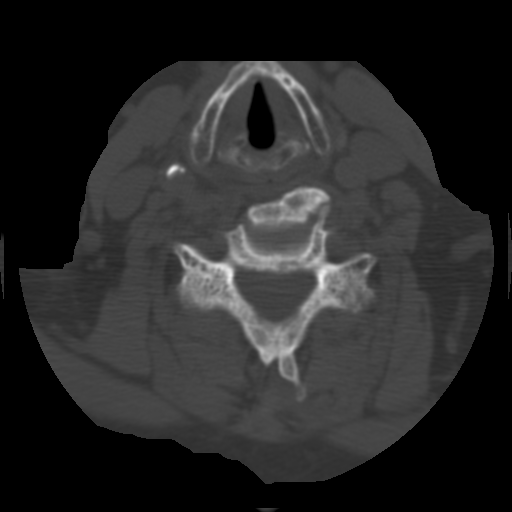
[im 52/78  bone]
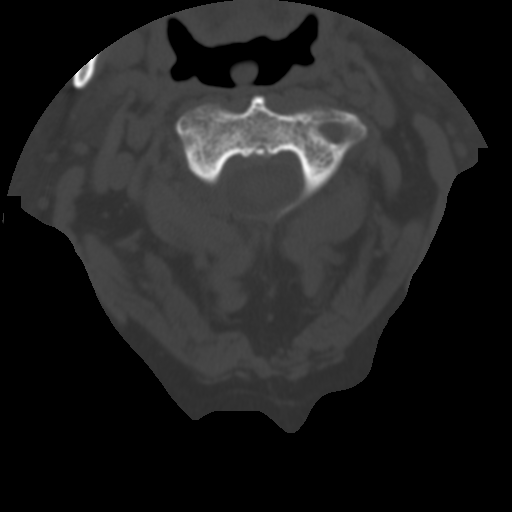

[Series 401: cor · coronal · 0.39mm/px · 6 of 51 slices shown]
[im 17/51  bone]
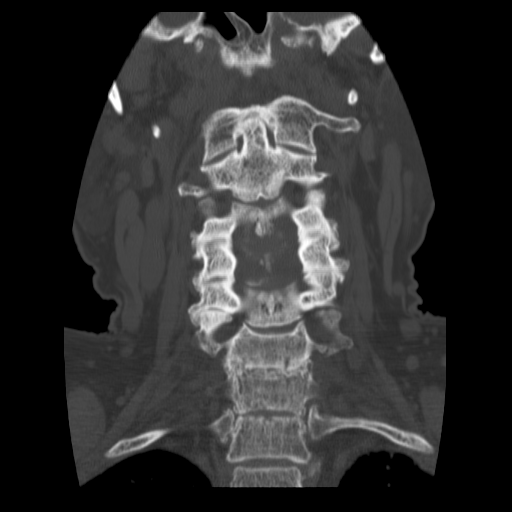
[im 21/51  bone]
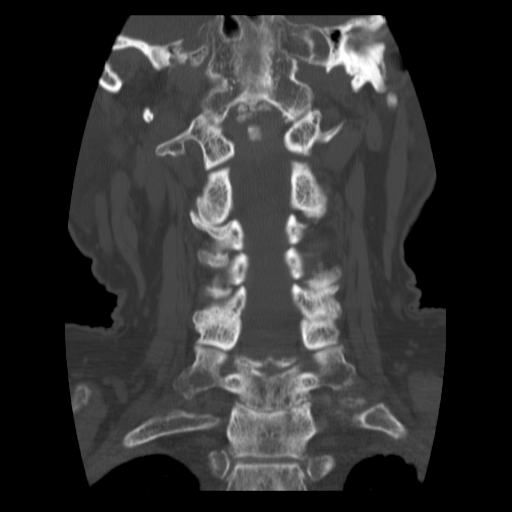
[im 24/51  soft-tissue]
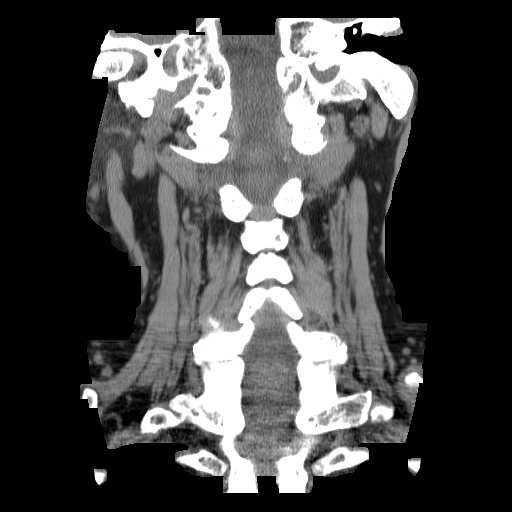
[im 26/51  bone]
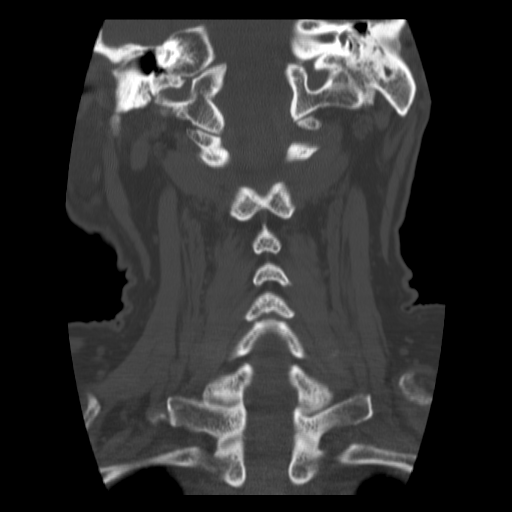
[im 30/51  bone]
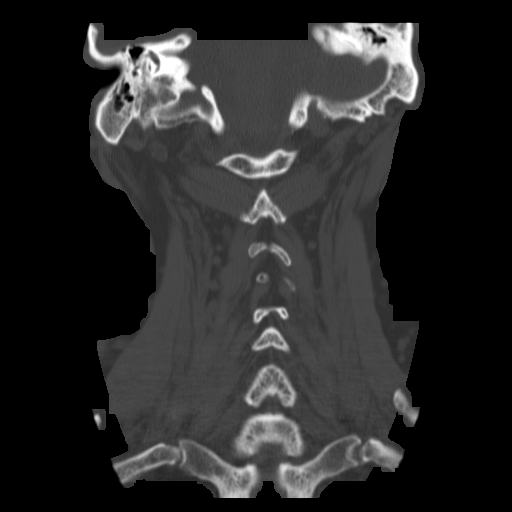
[im 34/51  bone]
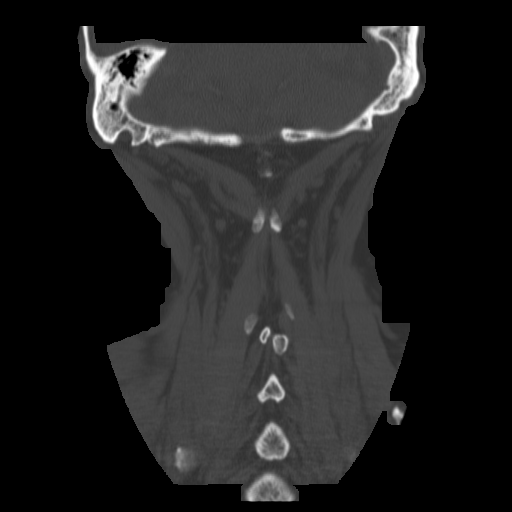

[Series 402: orthog · axial · 0.39mm/px · z∈[-212,-161]mm · 2 of 91 slices shown]
[im 31/91  bone]
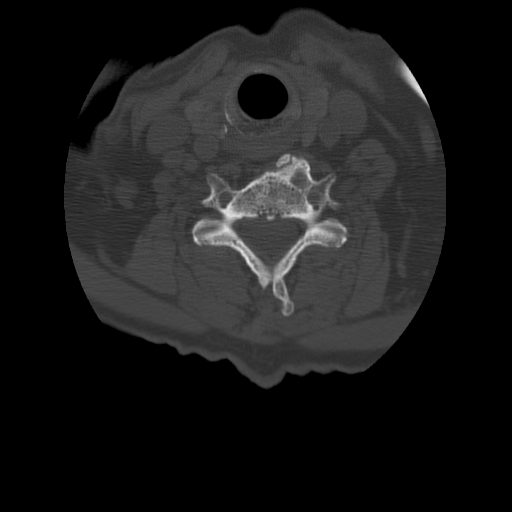
[im 61/91  bone]
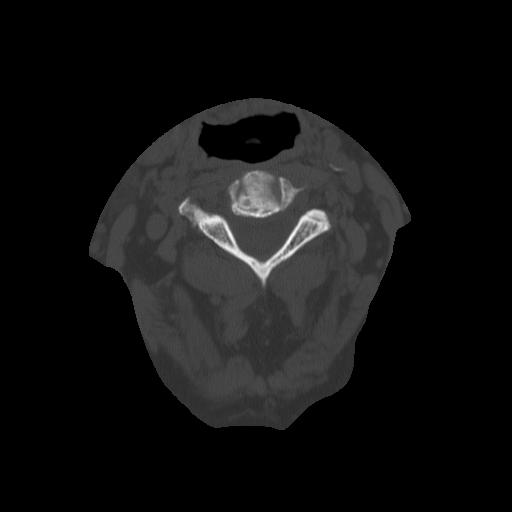

[12 of 30 positions shown; findings below may reference images not displayed]

FINDINGS: No visible cervical spine fracture or traumatic
subluxation.  No prevertebral soft tissue swelling or intraspinal
hematoma.  Advanced disc space narrowing C6-7 and C7-T1.
Multilevel facet arthropathy.  Central protrusion C3-C4 could
result in spinal stenosis.  Multilevel neural foraminal narrowing
worst at C6-7 right greater than left,  and C7-T1  left greater
than right.  Mild pannus surrounds the odontoid.

Advanced vascular calcification can be seen in the right greater
than left carotid bifurcation regions.    No neck masses are seen.
Lung apices appear clear.  Spondylosis appears fairly similar to
2010.
IMPRESSION: Multilevel spondylosis as described.  Central protrusion C3-4 could
result in spinal stenosis.  No cervical spinal fracture is
identified.

## 2013-08-06 ENCOUNTER — Emergency Department (HOSPITAL_COMMUNITY): Payer: PRIVATE HEALTH INSURANCE

## 2013-08-06 ENCOUNTER — Encounter (HOSPITAL_COMMUNITY): Payer: Self-pay | Admitting: Emergency Medicine

## 2013-08-06 DIAGNOSIS — M109 Gout, unspecified: Secondary | ICD-10-CM | POA: Insufficient documentation

## 2013-08-06 DIAGNOSIS — J029 Acute pharyngitis, unspecified: Secondary | ICD-10-CM | POA: Insufficient documentation

## 2013-08-06 DIAGNOSIS — J159 Unspecified bacterial pneumonia: Principal | ICD-10-CM | POA: Insufficient documentation

## 2013-08-06 DIAGNOSIS — R011 Cardiac murmur, unspecified: Secondary | ICD-10-CM | POA: Insufficient documentation

## 2013-08-06 DIAGNOSIS — Z886 Allergy status to analgesic agent status: Secondary | ICD-10-CM | POA: Insufficient documentation

## 2013-08-06 DIAGNOSIS — Z85118 Personal history of other malignant neoplasm of bronchus and lung: Secondary | ICD-10-CM | POA: Insufficient documentation

## 2013-08-06 DIAGNOSIS — Z950 Presence of cardiac pacemaker: Secondary | ICD-10-CM | POA: Insufficient documentation

## 2013-08-06 DIAGNOSIS — Z87442 Personal history of urinary calculi: Secondary | ICD-10-CM | POA: Insufficient documentation

## 2013-08-06 DIAGNOSIS — Z79899 Other long term (current) drug therapy: Secondary | ICD-10-CM | POA: Insufficient documentation

## 2013-08-06 DIAGNOSIS — Z8673 Personal history of transient ischemic attack (TIA), and cerebral infarction without residual deficits: Secondary | ICD-10-CM | POA: Insufficient documentation

## 2013-08-06 DIAGNOSIS — I10 Essential (primary) hypertension: Secondary | ICD-10-CM | POA: Insufficient documentation

## 2013-08-06 DIAGNOSIS — Z8546 Personal history of malignant neoplasm of prostate: Secondary | ICD-10-CM | POA: Insufficient documentation

## 2013-08-06 DIAGNOSIS — Z87891 Personal history of nicotine dependence: Secondary | ICD-10-CM | POA: Insufficient documentation

## 2013-08-06 DIAGNOSIS — I2510 Atherosclerotic heart disease of native coronary artery without angina pectoris: Secondary | ICD-10-CM | POA: Insufficient documentation

## 2013-08-06 DIAGNOSIS — E7800 Pure hypercholesterolemia, unspecified: Secondary | ICD-10-CM | POA: Insufficient documentation

## 2013-08-06 DIAGNOSIS — J441 Chronic obstructive pulmonary disease with (acute) exacerbation: Secondary | ICD-10-CM | POA: Insufficient documentation

## 2013-08-06 DIAGNOSIS — E119 Type 2 diabetes mellitus without complications: Secondary | ICD-10-CM | POA: Insufficient documentation

## 2013-08-06 DIAGNOSIS — K219 Gastro-esophageal reflux disease without esophagitis: Secondary | ICD-10-CM | POA: Insufficient documentation

## 2013-08-06 DIAGNOSIS — Z9981 Dependence on supplemental oxygen: Secondary | ICD-10-CM | POA: Insufficient documentation

## 2013-08-06 DIAGNOSIS — Z7901 Long term (current) use of anticoagulants: Secondary | ICD-10-CM | POA: Insufficient documentation

## 2013-08-06 DIAGNOSIS — I5022 Chronic systolic (congestive) heart failure: Secondary | ICD-10-CM | POA: Insufficient documentation

## 2013-08-06 DIAGNOSIS — Z8739 Personal history of other diseases of the musculoskeletal system and connective tissue: Secondary | ICD-10-CM | POA: Insufficient documentation

## 2013-08-06 DIAGNOSIS — Z951 Presence of aortocoronary bypass graft: Secondary | ICD-10-CM | POA: Insufficient documentation

## 2013-08-06 LAB — CBC
HEMATOCRIT: 27.6 % — AB (ref 39.0–52.0)
Hemoglobin: 9.1 g/dL — ABNORMAL LOW (ref 13.0–17.0)
MCH: 29.1 pg (ref 26.0–34.0)
MCHC: 33 g/dL (ref 30.0–36.0)
MCV: 88.2 fL (ref 78.0–100.0)
Platelets: 148 10*3/uL — ABNORMAL LOW (ref 150–400)
RBC: 3.13 MIL/uL — AB (ref 4.22–5.81)
RDW: 17.7 % — ABNORMAL HIGH (ref 11.5–15.5)
WBC: 4.4 10*3/uL (ref 4.0–10.5)

## 2013-08-06 LAB — BASIC METABOLIC PANEL
BUN: 19 mg/dL (ref 6–23)
CHLORIDE: 98 meq/L (ref 96–112)
CO2: 26 meq/L (ref 19–32)
Calcium: 9.4 mg/dL (ref 8.4–10.5)
Creatinine, Ser: 1.06 mg/dL (ref 0.50–1.35)
GFR calc Af Amer: 75 mL/min — ABNORMAL LOW (ref >90)
GFR, EST NON AFRICAN AMERICAN: 65 mL/min — AB (ref >90)
GLUCOSE: 145 mg/dL — AB (ref 70–99)
POTASSIUM: 3.7 meq/L (ref 3.7–5.3)
SODIUM: 139 meq/L (ref 137–147)

## 2013-08-06 LAB — I-STAT TROPONIN, ED: Troponin i, poc: 0.03 ng/mL (ref 0.00–0.08)

## 2013-08-06 NOTE — ED Notes (Signed)
Pt in c/o cough and congestion with body aches and chills, also chest pain that is worse with movement or coughing, denies shortness of breath, no distress noted

## 2013-08-07 ENCOUNTER — Encounter (HOSPITAL_COMMUNITY): Payer: Self-pay | Admitting: Internal Medicine

## 2013-08-07 ENCOUNTER — Inpatient Hospital Stay (HOSPITAL_COMMUNITY): Payer: PRIVATE HEALTH INSURANCE

## 2013-08-07 ENCOUNTER — Observation Stay (HOSPITAL_COMMUNITY)
Admission: EM | Admit: 2013-08-07 | Discharge: 2013-08-08 | Disposition: A | Discharge status: Home / Self Care | Payer: PRIVATE HEALTH INSURANCE | Attending: Internal Medicine | Admitting: Internal Medicine

## 2013-08-07 DIAGNOSIS — I6529 Occlusion and stenosis of unspecified carotid artery: Secondary | ICD-10-CM

## 2013-08-07 DIAGNOSIS — Z9981 Dependence on supplemental oxygen: Secondary | ICD-10-CM

## 2013-08-07 DIAGNOSIS — R55 Syncope and collapse: Secondary | ICD-10-CM

## 2013-08-07 DIAGNOSIS — K56609 Unspecified intestinal obstruction, unspecified as to partial versus complete obstruction: Secondary | ICD-10-CM

## 2013-08-07 DIAGNOSIS — J449 Chronic obstructive pulmonary disease, unspecified: Secondary | ICD-10-CM

## 2013-08-07 DIAGNOSIS — K921 Melena: Secondary | ICD-10-CM

## 2013-08-07 DIAGNOSIS — R531 Weakness: Secondary | ICD-10-CM

## 2013-08-07 DIAGNOSIS — Z95810 Presence of automatic (implantable) cardiac defibrillator: Secondary | ICD-10-CM

## 2013-08-07 DIAGNOSIS — Z951 Presence of aortocoronary bypass graft: Secondary | ICD-10-CM

## 2013-08-07 DIAGNOSIS — I509 Heart failure, unspecified: Secondary | ICD-10-CM

## 2013-08-07 DIAGNOSIS — I459 Conduction disorder, unspecified: Secondary | ICD-10-CM

## 2013-08-07 DIAGNOSIS — N179 Acute kidney failure, unspecified: Secondary | ICD-10-CM

## 2013-08-07 DIAGNOSIS — N390 Urinary tract infection, site not specified: Secondary | ICD-10-CM

## 2013-08-07 DIAGNOSIS — J9611 Chronic respiratory failure with hypoxia: Secondary | ICD-10-CM

## 2013-08-07 DIAGNOSIS — J189 Pneumonia, unspecified organism: Secondary | ICD-10-CM

## 2013-08-07 DIAGNOSIS — Z950 Presence of cardiac pacemaker: Secondary | ICD-10-CM

## 2013-08-07 DIAGNOSIS — I482 Chronic atrial fibrillation, unspecified: Secondary | ICD-10-CM | POA: Diagnosis present

## 2013-08-07 DIAGNOSIS — I5022 Chronic systolic (congestive) heart failure: Secondary | ICD-10-CM

## 2013-08-07 DIAGNOSIS — I4891 Unspecified atrial fibrillation: Secondary | ICD-10-CM

## 2013-08-07 DIAGNOSIS — J9610 Chronic respiratory failure, unspecified whether with hypoxia or hypercapnia: Secondary | ICD-10-CM

## 2013-08-07 DIAGNOSIS — R079 Chest pain, unspecified: Secondary | ICD-10-CM

## 2013-08-07 DIAGNOSIS — C61 Malignant neoplasm of prostate: Secondary | ICD-10-CM

## 2013-08-07 LAB — GLUCOSE, CAPILLARY
GLUCOSE-CAPILLARY: 84 mg/dL (ref 70–99)
GLUCOSE-CAPILLARY: 100 mg/dL — AB (ref 70–99)
GLUCOSE-CAPILLARY: 119 mg/dL — AB (ref 70–99)
Glucose-Capillary: 90 mg/dL (ref 70–99)

## 2013-08-07 LAB — COMPREHENSIVE METABOLIC PANEL
ALT: 20 U/L (ref 0–53)
AST: 22 U/L (ref 0–37)
Albumin: 3.2 g/dL — ABNORMAL LOW (ref 3.5–5.2)
Alkaline Phosphatase: 98 U/L (ref 39–117)
BUN: 18 mg/dL (ref 6–23)
CALCIUM: 8.9 mg/dL (ref 8.4–10.5)
CO2: 28 meq/L (ref 19–32)
Chloride: 94 mEq/L — ABNORMAL LOW (ref 96–112)
Creatinine, Ser: 0.99 mg/dL (ref 0.50–1.35)
GFR calc Af Amer: 88 mL/min — ABNORMAL LOW (ref >90)
GFR, EST NON AFRICAN AMERICAN: 76 mL/min — AB (ref >90)
Glucose, Bld: 127 mg/dL — ABNORMAL HIGH (ref 70–99)
Potassium: 3.6 mEq/L — ABNORMAL LOW (ref 3.7–5.3)
SODIUM: 135 meq/L — AB (ref 137–147)
Total Bilirubin: 1.1 mg/dL (ref 0.3–1.2)
Total Protein: 7.4 g/dL (ref 6.0–8.3)

## 2013-08-07 LAB — TROPONIN I: Troponin I: <0.3 ng/mL (ref <0.30)

## 2013-08-07 LAB — PRO B NATRIURETIC PEPTIDE: PRO B NATRI PEPTIDE: 5265 pg/mL — AB (ref 0–450)

## 2013-08-07 LAB — MRSA PCR SCREENING: MRSA by PCR: NEGATIVE

## 2013-08-07 LAB — DIGOXIN LEVEL: Digoxin Level: 0.7 ng/mL — ABNORMAL LOW (ref 0.8–2.0)

## 2013-08-07 MED ORDER — PANTOPRAZOLE SODIUM 40 MG PO TBEC
40.0000 mg | DELAYED_RELEASE_TABLET | Freq: Every day | ORAL | Status: DC
  Administered 2013-08-07 – 2013-08-08 (×2): 40 mg via ORAL
  Filled 2013-08-07 (×2): qty 1

## 2013-08-07 MED ORDER — ATORVASTATIN CALCIUM 20 MG PO TABS
20.0000 mg | ORAL_TABLET | Freq: Every day | ORAL | Status: DC
  Administered 2013-08-07 – 2013-08-08 (×2): 20 mg via ORAL
  Filled 2013-08-07 (×2): qty 1

## 2013-08-07 MED ORDER — MIDODRINE HCL 5 MG PO TABS
5.0000 mg | ORAL_TABLET | Freq: Three times a day (TID) | ORAL | Status: DC
  Administered 2013-08-07 – 2013-08-08 (×3): 5 mg via ORAL
  Filled 2013-08-07 (×7): qty 1

## 2013-08-07 MED ORDER — LISINOPRIL 2.5 MG PO TABS
2.5000 mg | ORAL_TABLET | Freq: Every morning | ORAL | Status: DC
  Administered 2013-08-07 – 2013-08-08 (×2): 2.5 mg via ORAL
  Filled 2013-08-07 (×2): qty 1

## 2013-08-07 MED ORDER — RIVAROXABAN 15 MG PO TABS
15.0000 mg | ORAL_TABLET | Freq: Every day | ORAL | Status: DC
  Administered 2013-08-07: 15 mg via ORAL
  Filled 2013-08-07 (×2): qty 1

## 2013-08-07 MED ORDER — LEVOFLOXACIN IN D5W 500 MG/100ML IV SOLN
500.0000 mg | Freq: Once | INTRAVENOUS | Status: AC
  Administered 2013-08-07: 500 mg via INTRAVENOUS
  Filled 2013-08-07: qty 100

## 2013-08-07 MED ORDER — SODIUM CHLORIDE 0.9 % IJ SOLN
3.0000 mL | Freq: Two times a day (BID) | INTRAMUSCULAR | Status: DC
  Administered 2013-08-07 – 2013-08-08 (×3): 3 mL via INTRAVENOUS

## 2013-08-07 MED ORDER — ALLOPURINOL 300 MG PO TABS
300.0000 mg | ORAL_TABLET | Freq: Every day | ORAL | Status: DC
  Administered 2013-08-07 – 2013-08-08 (×2): 300 mg via ORAL
  Filled 2013-08-07 (×2): qty 1

## 2013-08-07 MED ORDER — NITROGLYCERIN 0.4 MG SL SUBL: 0.4000 mg | SUBLINGUAL_TABLET | SUBLINGUAL | Status: DC | PRN

## 2013-08-07 MED ORDER — LEVOFLOXACIN IN D5W 750 MG/150ML IV SOLN: 750.0000 mg | INTRAVENOUS | Status: DC

## 2013-08-07 MED ORDER — ONDANSETRON HCL 4 MG/2ML IJ SOLN
4.0000 mg | Freq: Four times a day (QID) | INTRAMUSCULAR | Status: DC | PRN
Start: 2013-08-07 — End: 2013-08-08

## 2013-08-07 MED ORDER — MECLIZINE HCL 12.5 MG PO TABS
12.5000 mg | ORAL_TABLET | Freq: Two times a day (BID) | ORAL | Status: DC | PRN
  Filled 2013-08-07: qty 1

## 2013-08-07 MED ORDER — ONDANSETRON HCL 4 MG PO TABS: 4.0000 mg | ORAL_TABLET | Freq: Four times a day (QID) | ORAL | Status: DC | PRN

## 2013-08-07 MED ORDER — ACETAMINOPHEN 650 MG RE SUPP: 650.0000 mg | Freq: Four times a day (QID) | RECTAL | Status: DC | PRN

## 2013-08-07 MED ORDER — ALBUTEROL SULFATE (2.5 MG/3ML) 0.083% IN NEBU: 2.5000 mg | INHALATION_SOLUTION | Freq: Four times a day (QID) | RESPIRATORY_TRACT | Status: DC | PRN

## 2013-08-07 MED ORDER — FUROSEMIDE 20 MG PO TABS
20.0000 mg | ORAL_TABLET | Freq: Every day | ORAL | Status: DC
  Administered 2013-08-07 – 2013-08-08 (×2): 20 mg via ORAL
  Filled 2013-08-07 (×2): qty 1

## 2013-08-07 MED ORDER — DIGOXIN 125 MCG PO TABS
125.0000 ug | ORAL_TABLET | Freq: Every day | ORAL | Status: DC
  Administered 2013-08-07 – 2013-08-08 (×2): 125 ug via ORAL
  Filled 2013-08-07 (×2): qty 1

## 2013-08-07 MED ORDER — ACETAMINOPHEN 325 MG PO TABS: 650.0000 mg | ORAL_TABLET | Freq: Four times a day (QID) | ORAL | Status: DC | PRN

## 2013-08-07 MED ORDER — LEVOFLOXACIN IN D5W 250 MG/50ML IV SOLN
250.0000 mg | Freq: Once | INTRAVENOUS | Status: DC
  Filled 2013-08-07: qty 50

## 2013-08-07 MED ORDER — METOPROLOL TARTRATE 12.5 MG HALF TABLET
12.5000 mg | ORAL_TABLET | Freq: Two times a day (BID) | ORAL | Status: DC
  Administered 2013-08-07 – 2013-08-08 (×3): 12.5 mg via ORAL
  Filled 2013-08-07 (×4): qty 1

## 2013-08-07 MED ORDER — LORATADINE 10 MG PO TABS
10.0000 mg | ORAL_TABLET | Freq: Every day | ORAL | Status: DC
  Administered 2013-08-07 – 2013-08-08 (×2): 10 mg via ORAL
  Filled 2013-08-07 (×2): qty 1

## 2013-08-07 NOTE — Progress Notes (Signed)
ANTIBIOTIC CONSULT NOTE - INITIAL  Pharmacy Consult for Levaquin  Indication: rule out pneumonia  Allergies  Allergen Reactions  . Aspirin Hives   Vital Signs: Temp: 99.1 F (37.3 C) (05/29 0420) Temp src: Oral (05/29 0420) BP: 132/59 mmHg (05/29 0420) Pulse Rate: 63 (05/29 0420)  Labs:  Recent Labs  08/06/13 1905  WBC 4.4  HGB 9.1*  PLT 148*  CREATININE 1.06   Estimated Creatinine Clearance: 47.8 ml/min (by C-G formula based on Cr of 1.06).  Medical History: Past Medical History  Diagnosis Date  . Diabetes mellitus   . Hypertension   . Chronic systolic CHF (congestive heart failure)     a. 12/2012 Echo: EF 20-25%, mid-dist antsept AK, mod dil LA.  [name] CAD (coronary artery disease)     a. 07/2002 CABG x 3: LIMA->LAD, VG->Diag, VG->OM;  b. 06/2006 Cath: LM 50-60ost/p, LAD patent mid stent, D1 sev dzs, D2 patent stent, LCX nl, OM2 sev sten prox, RCA large/nl, VG->Diag nl, VG->OM nl, LIMA->LAD atretic, EF 30%.  . Ischemic cardiomyopathy     a. 06/2006 s/p SJM Promote RF, model 3207-36 BiV ICD, ser #: [id number].  [name] Hypercholesterolemia   . Black lung disease   . Nephrolithiasis 09/2000  . CVA (cerebral vascular accident)   . Prostate cancer 11/01/10    gleason 7, 8, 9, gold seeds 02/08/11  . Lung cancer   . COPD (chronic obstructive pulmonary disease)     a. On home O2.  [name] GERD (gastroesophageal reflux disease)   . Arthritis   . Gout   . Pacemaker   . Carotid artery occlusion    Assessment: 80 y/o M to start Levaquin for r/o PNA. WBC wnl, CrCrl ~ 48 mL/min, other labs as above.   Plan:  -Levaquin 750 mg IV q48h -Trend WBC, temp, renal function  -F/U any cultures, imaging   Narda Bonds 08/07/2013,6:06 AM

## 2013-08-07 NOTE — ED Provider Notes (Signed)
CSN: [id number]     Arrival date & time 08/06/13  1850 History   First MD Initiated Contact with Patient 08/07/13 [phone]     Chief Complaint  Patient presents with  . Cough  . Chest Pain     (Consider location/radiation/quality/duration/timing/severity/associated sxs/prior Treatment) HPI Comments: Pt with hx of CAD, CHF (EF-20%), COPD on home O2 2 liters. Pt comes in with cc of cough and chest pain. Pain is worse with cough. Pt's cough in non productive. There is some uri like sx as well, that have been prsent for even longer duration - with sore throat, congestions. Pt denies fevers. + body aches. Pt has no dib, or need for extra O2. No orthopnea, PND like sx and no new leg swelling.  Patient is a 80 y.o. male presenting with cough and chest pain. The history is provided by the patient and medical records.  Cough Associated symptoms: chest pain and sore throat   Associated symptoms: no shortness of breath and no wheezing   Chest Pain Associated symptoms: cough   Associated symptoms: no abdominal pain and no shortness of breath     Past Medical History  Diagnosis Date  . Diabetes mellitus   . Hypertension   . Chronic systolic CHF (congestive heart failure)     a. 12/2012 Echo: EF 20-25%, mid-dist antsept AK, mod dil LA.  [name] CAD (coronary artery disease)     a. 07/2002 CABG x 3: LIMA->LAD, VG->Diag, VG->OM;  b. 06/2006 Cath: LM 50-60ost/p, LAD patent mid stent, D1 sev dzs, D2 patent stent, LCX nl, OM2 sev sten prox, RCA large/nl, VG->Diag nl, VG->OM nl, LIMA->LAD atretic, EF 30%.  . Ischemic cardiomyopathy     a. 06/2006 s/p SJM Promote RF, model 3207-36 BiV ICD, ser #: [id number].  [name] Hypercholesterolemia   . Black lung disease   . Nephrolithiasis 09/2000  . CVA (cerebral vascular accident)   . Prostate cancer 11/01/10    gleason 7, 8, 9, gold seeds 02/08/11  . Lung cancer   . COPD (chronic obstructive pulmonary disease)     a. On home O2.  [name] GERD (gastroesophageal reflux disease)   .  Arthritis   . Gout   . Pacemaker   . Carotid artery occlusion    Past Surgical History  Procedure Laterality Date  . Colonoscopy    . Esophagogastroduodenoscopy  02/19/2011    Procedure: ESOPHAGOGASTRODUODENOSCOPY (EGD);  Surgeon: Jeryl Columbia, MD;  Location: [facility] ENDOSCOPY;  Service: Endoscopy;  Laterality: N/A;  . Upper endoscpopy    . Coronary angioplasty with stent placement  07/1997; 08/1997;03/1998  . Coronary artery bypass graft  07/2002    CABG X3  . Incision and drainage of wound  08/2002    right thigh; S/P EVH  . Insert / replace / remove pacemaker  1979; 1992; 01/2000;  . Insert / replace / remove pacemaker  09/2003; 06/2006    w/AICD  . Insert / replace / remove pacemaker  12/2004    pacmaker explant  . Shoulder arthroscopy w/ rotator cuff repair  05/2008    left   Family History  Problem Relation Age of Onset  . Alzheimer's disease Father 26  . Cancer Father 93    metastatic prostate cancer  . Diabetes Sister 58  . Diabetes Brother   . Diabetes Brother   . Diabetes Brother   . Hypotension Neg Hx   . Malignant hyperthermia Neg Hx   . Pseudochol deficiency Neg Hx    History  Substance Use Topics  . Smoking status: Former Smoker    Types: Cigarettes    Quit date: 03/12/1949  . Smokeless tobacco: Current User    Types: Chew  . Alcohol Use: No     Comment: drank until per pt 03/19/11    Review of Systems  Constitutional: Negative for activity change and appetite change.  HENT: Positive for congestion, sore throat and voice change.   Respiratory: Positive for cough. Negative for shortness of breath and wheezing.   Cardiovascular: Positive for chest pain.  Gastrointestinal: Negative for abdominal pain.  Genitourinary: Negative for dysuria.      Allergies  Aspirin  Home Medications   Prior to Admission medications   Medication Sig Start Date End Date Taking? Authorizing Provider  albuterol (PROVENTIL) (5 MG/ML) 0.5% nebulizer solution Take 0.5 mLs (2.5 mg  total) by nebulization every 6 (six) hours as needed for wheezing or shortness of breath. 07/03/12   [name], MD  allopurinol (ZYLOPRIM) 300 MG tablet Take 300 mg by mouth daily.      Historical Provider, MD  atorvastatin (LIPITOR) 20 MG tablet Take 20 mg by mouth daily.      Historical Provider, MD  CALCIUM-VITAMIN D PO Take 1 tablet by mouth daily.     Historical Provider, MD  Cyanocobalamin (VITAMIN B-12 PO) Take 1 tablet by mouth 2 (two) times daily.     Historical Provider, MD  digoxin (LANOXIN) 0.125 MG tablet Take 125 mcg by mouth daily.      Historical Provider, MD  furosemide (LASIX) 20 MG tablet Take 1 tablet (20 mg total) by mouth daily. 12/18/12   Ripudeep Krystal Eaton, MD  lisinopril (PRINIVIL,ZESTRIL) 2.5 MG tablet Take 1 tablet (2.5 mg total) by mouth every morning. 02/18/13   Thurnell Lose, MD  loratadine (CLARITIN) 10 MG tablet Take 1 tablet (10 mg total) by mouth daily. 07/03/12   [name], MD  meclizine (ANTIVERT) 12.5 MG tablet Take 1 tablet (12.5 mg total) by mouth 2 (two) times daily as needed for dizziness. 12/17/12   Ripudeep Krystal Eaton, MD  metoprolol tartrate (LOPRESSOR) 25 MG tablet Take 0.5 tablets (12.5 mg total) by mouth 2 (two) times daily. 02/17/13   Thurnell Lose, MD  midodrine (PROAMATINE) 5 MG tablet Take 1 tablet (5 mg total) by mouth 3 (three) times daily with meals. 02/17/13   Thurnell Lose, MD  NITROSTAT 0.4 MG SL tablet Place 0.4 mg under the tongue every 5 (five) minutes as needed for chest pain.  10/17/11   Historical Provider, MD  omeprazole (PRILOSEC) 20 MG capsule Take 20 mg by mouth every morning.     Historical Provider, MD  Rivaroxaban (XARELTO) 15 MG TABS tablet Take 15 mg by mouth daily with supper.    Historical Provider, MD   BP 127/55  Pulse 62  Temp(Src) 99.9 F (37.7 C) (Oral)  Resp 18  SpO2 100% Physical Exam  Nursing note and vitals reviewed. Constitutional: He is oriented to person, place, and time. He appears well-developed.  HENT:  Head:  Normocephalic and atraumatic.  Eyes: Conjunctivae and EOM are normal. Pupils are equal, round, and reactive to light.  Neck: Normal range of motion. Neck supple.  Cardiovascular: Normal rate and regular rhythm.   Murmur heard. Pulmonary/Chest: Effort normal. He has wheezes.  Pt has fine crackles diffusely in the lower lung fields.  Abdominal: Soft. Bowel sounds are normal. He exhibits no distension. There is no tenderness. There is no rebound and no  guarding.  Neurological: He is alert and oriented to person, place, and time.  Skin: Skin is warm.    ED Course  Procedures (including critical care time) Labs Review Labs Reviewed  CBC - Abnormal; Notable for the following:    RBC 3.13 (*)    Hemoglobin 9.1 (*)    HCT 27.6 (*)    RDW 17.7 (*)    Platelets 148 (*)    All other components within normal limits  BASIC METABOLIC PANEL - Abnormal; Notable for the following:    Glucose, Bld 145 (*)    GFR calc non Af Amer 65 (*)    GFR calc Af Amer 75 (*)    All other components within normal limits  PRO B NATRIURETIC PEPTIDE - Abnormal; Notable for the following:    Pro B Natriuretic peptide (BNP) 5265.0 (*)    All other components within normal limits  DIGOXIN LEVEL - Abnormal; Notable for the following:    Digoxin Level 0.7 (*)    All other components within normal limits  TROPONIN I  I-STAT TROPOININ, ED    Imaging Review Dg Chest 2 View  08/06/2013   CLINICAL DATA:  Cough and shortness of breath.  Chest pain.  EXAM: CHEST  2 VIEW  COMPARISON:  Chest radiograph performed 02/15/2013  FINDINGS: The lungs are well-aerated. Blunting of the left costophrenic angle may reflect chronic pleural thickening. Mild bilateral peripheral scarring is seen. Pulmonary vascularity is at the upper limits of normal. There is no evidence of pleural effusion or pneumothorax.  Mild right basilar airspace opacity may reflect mild pneumonia. There appears to be a 1.3 cm spiculated nodule at the left lung  base, not visualized on prior studies. This may be artifactual in nature, but CT of the chest would be helpful for further evaluation, on an elective nonemergent basis.  The heart is normal in size; the patient is status post median sternotomy, with evidence of prior CABG. A left-sided AICD is noted, with the lead ending at the right ventricle. An orphaned lead is noted ending at the coronary sinus. No acute osseous abnormalities are seen.  IMPRESSION: 1. Mild right basilar airspace opacity may reflect mild pneumonia. 2. Apparent 1.3 cm spiculated nodule at the left lung base, not visualized on prior studies. This may be artifactual in nature, but CT of the chest would be helpful for further evaluation, on an elective nonemergent basis. 3. Chronic bilateral lung changes seen, with peripheral scarring and blunting of the left costophrenic angle.   Electronically Signed   By: Garald Balding M.D.   On: 08/06/2013 22:42     EKG Interpretation   Date/Time:  Thursday Aug 06 2013 18:56:02 EDT Ventricular Rate:  60 PR Interval:    QRS Duration: 172 QT Interval:  444 QTC Calculation: 444 R Axis:   -72 Text Interpretation:  Ventricular-paced rhythm No acute changes Unchanged  from prior EKG Confirmed by Kathrynn Humble, MD, Thelma Comp [phone]) on 08/07/2013  1:57:56 AM      MDM   Final diagnoses:  CAP (community acquired pneumonia)  COPD (chronic obstructive pulmonary disease)  CHF (congestive heart failure)    Pt comes in with cough, pleuritic type chest pain. No fevers. + dib. Hx of advanced COPD and CHF. No increase in O2 requirement. Xray shows possible infiltrate. Exam is equivocal. No evidence of CHF exacerbation - but there is definitely some crackles in the lungs and mild fluid overload.  PNA vs. CHF exacerbation vs BOTH. Also viral syndrome/bronchitis  possible.  ivab started, will admit.      Varney Biles, MD 08/07/13 [phone]

## 2013-08-07 NOTE — Progress Notes (Signed)
Utilization review completed. Lakeem Rozo, RN, BSN. 

## 2013-08-07 NOTE — H&P (Signed)
[facility] History and Physical  GAYLIN BULTHUIS JJO:[id number] DOB: 12/31/1932 DOA: 08/07/2013  Referring physician: ER physician. PCP: [name], MD   Chief Complaint: Shortness of breath and cough.  HPI: [name] is a 80 y.o. male with history of cardiomyopathy status post ICD placement, gout, hyperlipidemia, atrial fibrillation on xarelto presents to the ER because of persistent cough with mild shortness of breath last one week. Patient is having subjective feeling of fever and chills and chest pain on coughing. ER chest x-ray shows infiltrates concerning for pneumonia and also a lung nodule. Patient denies any nausea vomiting abdominal pain diarrhea. Patient has been started on Levaquin for pneumonia and admitted for further workup.  Review of Systems: As presented in the history of presenting illness, rest negative.  Past Medical History  Diagnosis Date  . Diabetes mellitus   . Hypertension   . Chronic systolic CHF (congestive heart failure)     a. 12/2012 Echo: EF 20-25%, mid-dist antsept AK, mod dil LA.  [name] CAD (coronary artery disease)     a. 07/2002 CABG x 3: LIMA->LAD, VG->Diag, VG->OM;  b. 06/2006 Cath: LM 50-60ost/p, LAD patent mid stent, D1 sev dzs, D2 patent stent, LCX nl, OM2 sev sten prox, RCA large/nl, VG->Diag nl, VG->OM nl, LIMA->LAD atretic, EF 30%.  . Ischemic cardiomyopathy     a. 06/2006 s/p SJM Promote RF, model 3207-36 BiV ICD, ser #: [id number].  [name] Hypercholesterolemia   . Black lung disease   . Nephrolithiasis 09/2000  . CVA (cerebral vascular accident)   . Prostate cancer 11/01/10    gleason 7, 8, 9, gold seeds 02/08/11  . Lung cancer   . COPD (chronic obstructive pulmonary disease)     a. On home O2.  [name] GERD (gastroesophageal reflux disease)   . Arthritis   . Gout   . Pacemaker   . Carotid artery occlusion    Past Surgical History  Procedure Laterality Date  . Colonoscopy    . Esophagogastroduodenoscopy  02/19/2011    Procedure:  ESOPHAGOGASTRODUODENOSCOPY (EGD);  Surgeon: Jeryl Columbia, MD;  Location: [facility] ENDOSCOPY;  Service: Endoscopy;  Laterality: N/A;  . Upper endoscpopy    . Coronary angioplasty with stent placement  07/1997; 08/1997;03/1998  . Coronary artery bypass graft  07/2002    CABG X3  . Incision and drainage of wound  08/2002    right thigh; S/P EVH  . Insert / replace / remove pacemaker  1979; 1992; 01/2000;  . Insert / replace / remove pacemaker  09/2003; 06/2006    w/AICD  . Insert / replace / remove pacemaker  12/2004    pacmaker explant  . Shoulder arthroscopy w/ rotator cuff repair  05/2008    left   Social History:  reports that he quit smoking about 64 years ago. His smoking use included Cigarettes. He smoked 0.00 packs per day. His smokeless tobacco use includes Chew. He reports that he does not drink alcohol or use illicit drugs. Where does patient live home. Can patient participate in ADLs? Yes.  Allergies  Allergen Reactions  . Aspirin Hives    Family History:  Family History  Problem Relation Age of Onset  . Alzheimer's disease Father 71  . Cancer Father 22    metastatic prostate cancer  . Diabetes Sister 5  . Diabetes Brother   . Diabetes Brother   . Diabetes Brother   . Hypotension Neg Hx   . Malignant hyperthermia Neg Hx   . Pseudochol deficiency Neg  Hx       Prior to Admission medications   Medication Sig Start Date End Date Taking? Authorizing Provider  albuterol (PROVENTIL) (5 MG/ML) 0.5% nebulizer solution Take 0.5 mLs (2.5 mg total) by nebulization every 6 (six) hours as needed for wheezing or shortness of breath. 07/03/12   [name], MD  allopurinol (ZYLOPRIM) 300 MG tablet Take 300 mg by mouth daily.      Historical Provider, MD  atorvastatin (LIPITOR) 20 MG tablet Take 20 mg by mouth daily.      Historical Provider, MD  CALCIUM-VITAMIN D PO Take 1 tablet by mouth daily.     Historical Provider, MD  Cyanocobalamin (VITAMIN B-12 PO) Take 1 tablet by mouth 2 (two) times  daily.     Historical Provider, MD  digoxin (LANOXIN) 0.125 MG tablet Take 125 mcg by mouth daily.      Historical Provider, MD  furosemide (LASIX) 20 MG tablet Take 1 tablet (20 mg total) by mouth daily. 12/18/12   Ripudeep Krystal Eaton, MD  lisinopril (PRINIVIL,ZESTRIL) 2.5 MG tablet Take 1 tablet (2.5 mg total) by mouth every morning. 02/18/13   Thurnell Lose, MD  loratadine (CLARITIN) 10 MG tablet Take 1 tablet (10 mg total) by mouth daily. 07/03/12   [name], MD  meclizine (ANTIVERT) 12.5 MG tablet Take 1 tablet (12.5 mg total) by mouth 2 (two) times daily as needed for dizziness. 12/17/12   Ripudeep Krystal Eaton, MD  metoprolol tartrate (LOPRESSOR) 25 MG tablet Take 0.5 tablets (12.5 mg total) by mouth 2 (two) times daily. 02/17/13   Thurnell Lose, MD  midodrine (PROAMATINE) 5 MG tablet Take 1 tablet (5 mg total) by mouth 3 (three) times daily with meals. 02/17/13   Thurnell Lose, MD  NITROSTAT 0.4 MG SL tablet Place 0.4 mg under the tongue every 5 (five) minutes as needed for chest pain.  10/17/11   Historical Provider, MD  omeprazole (PRILOSEC) 20 MG capsule Take 20 mg by mouth every morning.     Historical Provider, MD  Rivaroxaban (XARELTO) 15 MG TABS tablet Take 15 mg by mouth daily with supper.    Historical Provider, MD    Physical Exam: Filed Vitals:   08/06/13 1854 08/07/13 0100 08/07/13 0145 08/07/13 0420  BP: 115/61 122/96 127/55 132/59  Pulse: 63  62 63  Temp: 99.9 F (37.7 C)   99.1 F (37.3 C)  TempSrc: Oral   Oral  Resp: 22 11 18 18   Height:    5\' 6"  (1.676 m)  Weight:    59.8 kg (131 lb 13.4 oz)  SpO2: 96% 100% 100% 97%     General:  Well-developed and nourished.  Eyes: Anicteric no pallor.  ENT: No discharge from the ears eyes nose mouth.  Neck: No mass felt.  Cardiovascular: S1-S2 heard.  Respiratory: No rhonchi or crepitations.  Abdomen: Soft nontender bowel sounds present.  Skin: No rash.  Musculoskeletal: No edema.  Psychiatric: Appears  normal.  Neurologic: Alert. Oriented to time place and person. Moves all extremities.  Labs on Admission:  Basic Metabolic Panel:  Recent Labs Lab 08/06/13 1905  NA 139  K 3.7  CL 98  CO2 26  GLUCOSE 145*  BUN 19  CREATININE 1.06  CALCIUM 9.4   Liver Function Tests: No results found for this basename: AST, ALT, ALKPHOS, BILITOT, PROT, ALBUMIN,  in the last 168 hours No results found for this basename: LIPASE, AMYLASE,  in the last 168 hours No results found  for this basename: AMMONIA,  in the last 168 hours CBC:  Recent Labs Lab 08/06/13 1905  WBC 4.4  HGB 9.1*  HCT 27.6*  MCV 88.2  PLT 148*   Cardiac Enzymes:  Recent Labs Lab 08/07/13 0211  TROPONINI <0.30    BNP (last 3 results)  Recent Labs  12/16/12 0910 08/07/13 0211  PROBNP 2381.0* 5265.0*   CBG:  Recent Labs Lab 08/07/13 0437  GLUCAP 84    Radiological Exams on Admission: Dg Chest 2 View  08/06/2013   CLINICAL DATA:  Cough and shortness of breath.  Chest pain.  EXAM: CHEST  2 VIEW  COMPARISON:  Chest radiograph performed 02/15/2013  FINDINGS: The lungs are well-aerated. Blunting of the left costophrenic angle may reflect chronic pleural thickening. Mild bilateral peripheral scarring is seen. Pulmonary vascularity is at the upper limits of normal. There is no evidence of pleural effusion or pneumothorax.  Mild right basilar airspace opacity may reflect mild pneumonia. There appears to be a 1.3 cm spiculated nodule at the left lung base, not visualized on prior studies. This may be artifactual in nature, but CT of the chest would be helpful for further evaluation, on an elective nonemergent basis.  The heart is normal in size; the patient is status post median sternotomy, with evidence of prior CABG. A left-sided AICD is noted, with the lead ending at the right ventricle. An orphaned lead is noted ending at the coronary sinus. No acute osseous abnormalities are seen.  IMPRESSION: 1. Mild right basilar  airspace opacity may reflect mild pneumonia. 2. Apparent 1.3 cm spiculated nodule at the left lung base, not visualized on prior studies. This may be artifactual in nature, but CT of the chest would be helpful for further evaluation, on an elective nonemergent basis. 3. Chronic bilateral lung changes seen, with peripheral scarring and blunting of the left costophrenic angle.   Electronically Signed   By: Garald Balding M.D.   On: 08/06/2013 22:42    EKG: Independently reviewed. Paced rhythm.  Assessment/Plan Principal Problem:   CAP (community acquired pneumonia) Active Problems:   Atrial fibrillation   ICD-St.Jude   Systolic CHF, chronic   S/P CABG (coronary artery bypass graft)   Pneumonia   1. Pneumonia - patient has been placed on Levaquin for community-acquired pneumonia. Check urine Legionella and strep antigen. 2. Lung nodule - check CT chest. 3. Atrial fibrillation - rate controlled on xarelto. On digoxin and metoprolol. 4. Cardiomyopathy last EF measured was 20-25% - continue Lasix 5. CAD - has had some pleuritic-type of chest pain. Check troponin. Presently chest pain-free. 6. Chronic anemia and thrombocytopenia - follow CBC. 7. Hyperlipidemia - on statin. 8. History of gout - continue present medications.    Code Status: Full code.  Family Communication: None.  Disposition Plan: Admit to inpatient.    [facility] Pager [phone].  If 7PM-7AM, please contact night-coverage [email or web address] Password [facility] 08/07/2013, 6:03 AM

## 2013-08-07 NOTE — Progress Notes (Signed)
PROGRESS NOTE  [name] BCW:[id number] DOB: Sep 02, 1932 DOA: 08/07/2013 PCP: [name], MD  Assessment/Plan: Pulmonary infiltrates/lung nodules -Chest x-ray revealed right basilar opacity and blunting of the left costophrenic angle with a spiculated lung nodule -Patient was started on levofloxacin -CT chest was negative for any new airspace opacities; it revealed chronic LLL scarring/parenchymal consolidation adjacent to an area of pleural calcification/thickening; there were also numerous unchanged pulmonary nodules with mildly prominent mediastinal lymphadenopathy -Will treat as community-acquired pneumonia -Patient will need outpatient surveillance CTs and possibly PET scan  elevated proBNP -The patient is not clinically volume overloaded -Likely due to the patient's ischemic cardiomyopathy with EF 20-25% in the setting of CKD stage III -Clinically compensated at this time CKD stage III -Baseline creatinine 1.0-1.3 Chronic atrial fibrillation -Rate controlled -Continue rivaroxaban Ischemic cardiomyopathy -Continue lisinopril and metoprolol tartrate -Restart furosemide home dose, 20 mg daily History of orthostatic hypotension -Clinically stable -Continue midodrine Hyperlipidemia -Continue statin Atypical chest pain -Troponins negative Thrombocytopenia -Chronic, stable -Continue to monitor  Family Communication:   Pt at beside Disposition Plan:   Home when medically stable    Antibiotics:  Levofloxacin 08/06/2013>>>    Procedures/Studies: Dg Chest 2 View  08/06/2013   CLINICAL DATA:  Cough and shortness of breath.  Chest pain.  EXAM: CHEST  2 VIEW  COMPARISON:  Chest radiograph performed 02/15/2013  FINDINGS: The lungs are well-aerated. Blunting of the left costophrenic angle may reflect chronic pleural thickening. Mild bilateral peripheral scarring is seen. Pulmonary vascularity is at the upper limits of normal. There is no evidence of pleural  effusion or pneumothorax.  Mild right basilar airspace opacity may reflect mild pneumonia. There appears to be a 1.3 cm spiculated nodule at the left lung base, not visualized on prior studies. This may be artifactual in nature, but CT of the chest would be helpful for further evaluation, on an elective nonemergent basis.  The heart is normal in size; the patient is status post median sternotomy, with evidence of prior CABG. A left-sided AICD is noted, with the lead ending at the right ventricle. An orphaned lead is noted ending at the coronary sinus. No acute osseous abnormalities are seen.  IMPRESSION: 1. Mild right basilar airspace opacity may reflect mild pneumonia. 2. Apparent 1.3 cm spiculated nodule at the left lung base, not visualized on prior studies. This may be artifactual in nature, but CT of the chest would be helpful for further evaluation, on an elective nonemergent basis. 3. Chronic bilateral lung changes seen, with peripheral scarring and blunting of the left costophrenic angle.   Electronically Signed   By: Garald Balding M.D.   On: 08/06/2013 22:42   Ct Chest Wo Contrast  08/07/2013   CLINICAL DATA:  Persistent cough and mild shortness of breath for 1 week. Possible spiculated left lower lobe nodule on chest x-ray.  EXAM: CT CHEST WITHOUT CONTRAST  TECHNIQUE: Multidetector CT imaging of the chest was performed following the standard protocol without IV contrast.  COMPARISON:  Chest radiographs 08/06/2013 and CT 06/30/2012 and 03/24/2011  FINDINGS: Multiple mildly prominent lymph nodes are present throughout the mediastinum and have slightly increased in size from the prior study, nonspecific. Prevascular lymph nodes measure up to 8 mm in short axis. Right paratracheal lymph nodes measure up to 12 mm in size. No enlarged hilar lymph nodes are identified. Left subclavian 3 lead ICD is seen with leads in the right atrium, right ventricle, and coronary sinus.  Orphaned right subclavian approach  right ventricular lead is also noted. Sequelae of prior CABG are identified. There is left-sided cardiac chamber enlargement, similar to the prior study. Three-vessel coronary artery calcifications are noted. Coarse calcification involving the region of the left ventricular apex and septal wall is similar to the prior study and likely reflects prior myocardial infarction.  There is no pleural or pericardial effusion. Evaluation of the lung parenchyma is mildly limited by motion. There is a 3 mm ground-glass nodule in the apical right upper lobe (series 3, image 7), unchanged. 4 mm ground-glass nodule in the superior segment of the right lower lobe is unchanged (image 22). 2 mm right upper lobe nodule is also unchanged (image 11).  Subpleural band like opacity in the right upper lobe adjacent to the minor and major fissures with some associated calcification as well as an adjacent 4 mm calcified nodule do not appear significantly changed and likely reflect sequelae of prior infection/ inflammation. Focal pleural thickening in the anterolateral left hemithorax is unchanged (image 14). Calcified left upper lobe nodule as well as adjacent 3 mm noncalcified upper lobe nodule are unchanged (image 21). Chronic parenchymal consolidation/scarring in the dependent left lower lobe adjacent to pleural calcification is unchanged from the 2 prior studies. Minimal subpleural opacity in the dependent right lower lobe with adjacent parenchymal calcification is unchanged and may also reflect scarring and/or atelectasis.  No new region of confluent airspace opacity is seen. Areas of mild subpleural reticulation within both lungs, most prominently in the lower lobes and lingula, are grossly similar to the prior studies, although evaluation is limited by motion artifact on all examinations.  The visualized portion of the upper abdomen is unremarkable. Multiple old posterior left rib fractures are noted. Multilevel bridging osteophytosis  is present throughout the thoracic spine.  IMPRESSION: No definite acute abnormality identified in the chest. Grossly stable appearance of chronic lung changes allowing for motion artifact as above.   Electronically Signed   By: Logan Bores   On: 08/07/2013 09:38         Subjective: Patient is feeling better this morning. His cough and shortness of breath improved. Denies any fevers, chills, chest pain, nausea, vomiting, diarrhea, abdominal pain. The patient  Ate 75% of lunch.  Objective: Filed Vitals:   08/07/13 0100 08/07/13 0145 08/07/13 0420 08/07/13 1049  BP: 122/96 127/55 132/59 122/58  Pulse:  62 63 60  Temp:   99.1 F (37.3 C)   TempSrc:   Oral   Resp: 11 18 18    Height:   5\' 6"  (1.676 m)   Weight:   59.8 kg (131 lb 13.4 oz)   SpO2: 100% 100% 97%     Intake/Output Summary (Last 24 hours) at 08/07/13 1246 Last data filed at 08/07/13 0900  Gross per 24 hour  Intake    460 ml  Output    325 ml  Net    135 ml   Weight change:  Exam:   General:  Pt is alert, follows commands appropriately, not in acute distress  HEENT: No icterus, No thrush,  [place]/AT  Cardiovascular: RRR, S1/S2, no rubs, no gallops  Respiratory: Left basilar crackles. Rectal to auscultation. No wheezing.  Abdomen: Soft/+BS, non tender, non distended, no guarding  Extremities: 1+ LE edema, No lymphangitis, No petechiae, No rashes, no synovitis  Data Reviewed: Basic Metabolic Panel:  Recent Labs Lab 08/06/13 1905 08/07/13 0823  NA 139 135*  K 3.7 3.6*  CL 98 94*  CO2  26 28  GLUCOSE 145* 127*  BUN 19 18  CREATININE 1.06 0.99  CALCIUM 9.4 8.9   Liver Function Tests:  Recent Labs Lab 08/07/13 0823  AST 22  ALT 20  ALKPHOS 98  BILITOT 1.1  PROT 7.4  ALBUMIN 3.2*   No results found for this basename: LIPASE, AMYLASE,  in the last 168 hours No results found for this basename: AMMONIA,  in the last 168 hours CBC:  Recent Labs Lab 08/06/13 1905  WBC 4.4  HGB 9.1*  HCT 27.6*   MCV 88.2  PLT 148*   Cardiac Enzymes:  Recent Labs Lab 08/07/13 0211 08/07/13 0923  TROPONINI <0.30 <0.30   BNP: No components found with this basename: POCBNP,  CBG:  Recent Labs Lab 08/07/13 0437 08/07/13 0749 08/07/13 1158  GLUCAP 84 90 100*    Recent Results (from the past 240 hour(s))  MRSA PCR SCREENING     Status: None   Collection Time    08/07/13  4:54 AM      Result Value Ref Range Status   MRSA by PCR NEGATIVE  NEGATIVE Final   Comment:            The GeneXpert MRSA Assay (FDA     approved for NASAL specimens     only), is one component of a     comprehensive MRSA colonization     surveillance program. It is not     intended to diagnose MRSA     infection nor to guide or     monitor treatment for     MRSA infections.     Scheduled Meds: . allopurinol  300 mg Oral Daily  . atorvastatin  20 mg Oral Daily  . digoxin  125 mcg Oral Daily  . furosemide  20 mg Oral Daily  . levofloxacin (LEVAQUIN) IV  250 mg Intravenous Once  . [START ON 08/09/2013] levofloxacin (LEVAQUIN) IV  750 mg Intravenous Q48H  . lisinopril  2.5 mg Oral q morning - 10a  . loratadine  10 mg Oral Daily  . metoprolol tartrate  12.5 mg Oral BID  . midodrine  5 mg Oral TID WC  . pantoprazole  40 mg Oral Daily  . Rivaroxaban  15 mg Oral Q supper  . sodium chloride  3 mL Intravenous Q12H   Continuous Infusions:    Orson Eva, DO  [facility] Pager [phone]  If 7PM-7AM, please contact night-coverage [email or web address] Password [email or web address] 08/07/2013, 12:46 PM   LOS: 0 days

## 2013-08-07 NOTE — ED Notes (Signed)
Pt ambulated independently approximately 40 yards. Pt SpO2 dropped to 90% during ambulation. Pt reported some dyspnea and had a noticeable increased work of breathing.

## 2013-08-08 DIAGNOSIS — J9610 Chronic respiratory failure, unspecified whether with hypoxia or hypercapnia: Secondary | ICD-10-CM

## 2013-08-08 DIAGNOSIS — J9611 Chronic respiratory failure with hypoxia: Secondary | ICD-10-CM

## 2013-08-08 LAB — CBC
HCT: 28.8 % — ABNORMAL LOW (ref 39.0–52.0)
HEMOGLOBIN: 9.2 g/dL — AB (ref 13.0–17.0)
MCH: 28.5 pg (ref 26.0–34.0)
MCHC: 31.9 g/dL (ref 30.0–36.0)
MCV: 89.2 fL (ref 78.0–100.0)
Platelets: 138 10*3/uL — ABNORMAL LOW (ref 150–400)
RBC: 3.23 MIL/uL — AB (ref 4.22–5.81)
RDW: 17.7 % — ABNORMAL HIGH (ref 11.5–15.5)
WBC: 3.7 10*3/uL — ABNORMAL LOW (ref 4.0–10.5)

## 2013-08-08 LAB — BASIC METABOLIC PANEL
BUN: 17 mg/dL (ref 6–23)
CHLORIDE: 98 meq/L (ref 96–112)
CO2: 27 meq/L (ref 19–32)
Calcium: 9.1 mg/dL (ref 8.4–10.5)
Creatinine, Ser: 1.1 mg/dL (ref 0.50–1.35)
GFR calc Af Amer: 72 mL/min — ABNORMAL LOW (ref >90)
GFR calc non Af Amer: 62 mL/min — ABNORMAL LOW (ref >90)
GLUCOSE: 92 mg/dL (ref 70–99)
Potassium: 3.8 mEq/L (ref 3.7–5.3)
Sodium: 138 mEq/L (ref 137–147)

## 2013-08-08 LAB — LEGIONELLA ANTIGEN, URINE: Legionella Antigen, Urine: NEGATIVE

## 2013-08-08 LAB — STREP PNEUMONIAE URINARY ANTIGEN: STREP PNEUMO URINARY ANTIGEN: NEGATIVE

## 2013-08-08 MED ORDER — LEVOFLOXACIN 750 MG PO TABS: 750.0000 mg | ORAL_TABLET | ORAL | Status: DC

## 2013-08-08 MED ORDER — RIVAROXABAN 15 MG PO TABS: 15.0000 mg | ORAL_TABLET | Freq: Every day | ORAL | Status: DC

## 2013-08-08 NOTE — Discharge Summary (Addendum)
Physician Discharge Summary  [name] XBJ:[id number] DOB: 1932-03-22 DOA: 08/07/2013  PCP: [name], MD  Admit date: 08/07/2013 Discharge date: 08/08/2013  Recommendations for Outpatient Follow-up:  1. Pt will need to follow up with PCP in 2 weeks post discharge 2. Please obtain BMP to evaluate electrolytes and kidney function 3. Please also check CBC to evaluate Hg and Hct levels  Discharge Diagnoses:  Principal Problem:   CAP (community acquired pneumonia) Active Problems:   Atrial fibrillation   ICD-St.Jude   Systolic CHF, chronic   S/P CABG (coronary artery bypass graft)   Pneumonia   Chronic respiratory failure Pulmonary infiltrates/lung nodules/CAP  -Chest x-ray revealed right basilar opacity and blunting of the left costophrenic angle with a spiculated lung nodule  -Patient was started on levofloxacin  -CT chest was negative for any new airspace opacities; it revealed chronic LLL scarring/parenchymal consolidation adjacent to an area of pleural calcification/thickening; there were also numerous unchanged pulmonary nodules with mildly prominent mediastinal lymphadenopathy  -Will treat as community-acquired pneumonia  -Patient will need outpatient surveillance CTs and possibly PET scan  -Patient will continue on levofloxacin 750 mg every 48 hours for 6 additional days which will complete 7 days of therapy. His next dose would be 08/09/2013 elevated proBNP  -The patient is not clinically volume overloaded  -Likely due to the patient's ischemic cardiomyopathy with EF 20-25% in the setting of CKD stage III  -Clinically compensated at this time  Chronic respiratory failure -The patient is stable on his usual 2.5 L nasal cannula at home CKD stage III  -Baseline creatinine 1.0-1.3 -serum creatinine 1.10 on the day of discharge  Chronic atrial fibrillation  -Rate controlled  -Continue rivaroxaban  Ischemic cardiomyopathy  -Continue lisinopril and metoprolol tartrate    -Restart furosemide home dose, 20 mg daily  History of orthostatic hypotension  -Clinically stable  -Continue midodrine  Hyperlipidemia  -Continue statin  Atypical chest pain  -Troponins negative x2 -POC troponin neg x 1 Thrombocytopenia  -Chronic, stable  -Continue to monitor  Family Communication: Pt at beside  Disposition Plan: Home when medically stable  Antibiotics:  Levofloxacin 08/06/2013>>>   Discharge Condition: Stable home  Disposition: home  Diet:heart healthy Wt Readings from Last 3 Encounters:  08/08/13 60.102 kg (132 lb 8 oz)  06/09/13 61.689 kg (136 lb)  05/26/13 62.097 kg (136 lb 14.4 oz)    History of present illness:  80 y.o. male with history of cardiomyopathy status post ICD placement, gout, hyperlipidemia, atrial fibrillation on xarelto presents to the ER because of persistent cough with mild shortness of breath last one week. Patient is having subjective feeling of fever and chills and chest pain on coughing. ER chest x-ray shows infiltrates concerning for pneumonia and also a lung nodule. Patient denies any nausea vomiting abdominal pain diarrhea. Patient has been started on Levaquin for pneumonia and admitted for further workup. Followup CT of the chest revealed bilateral pulmonary nodules and groundglass opacities that were not significantly changed from his previous findings although he did have mildly prominent lymphadenopathy in the mediastinum. The patient will be continued surveillance imaging regarding his pulmonary opacities as well as lung nodules and may ultimately require a PET scan which can be performed in the outpatient setting. The patient was started on levofloxacin. He clinically improved. The patient's oxygen saturation was 100% on his usual 2.5 L at home. His coughing and shortness of breath improved. The patient was continued on his rivaroxaban residual fibrillation. There was no bleeding complications. His  hemoglobin remained stable. The  patient will be discharged in stable condition.     Discharge Exam: Filed Vitals:   08/08/13 0539  BP: 122/75  Pulse: 65  Temp: 97.7 F (36.5 C)  Resp: 18   Filed Vitals:   08/07/13 1300 08/07/13 1406 08/07/13 2248 08/08/13 0539  BP: 105/39 122/61 110/61 122/75  Pulse: 63 62 78 65  Temp: 98.6 F (37 C)  98.4 F (36.9 C) 97.7 F (36.5 C)  TempSrc: Oral  Oral Oral  Resp: 19  18 18   Height:      Weight:    60.102 kg (132 lb 8 oz)  SpO2: 100%  100% 100%   General: A&O x 3, NAD, pleasant, cooperative Cardiovascular: RRR, no rub, no gallop, no S3 Respiratory: CTAB, no wheeze, no rhonchi Abdomen:soft, nontender, nondistended, positive bowel sounds Extremities: 1+ LE edema, No lymphangitis, no petechiae  Discharge Instructions      Discharge Instructions   Diet - low sodium heart healthy    Complete by:  As directed      Increase activity slowly    Complete by:  As directed             Medication List         acetaminophen 325 MG tablet  Commonly known as:  TYLENOL  Take 325 mg by mouth every 6 (six) hours as needed.     albuterol 108 (90 BASE) MCG/ACT inhaler  Commonly known as:  PROVENTIL HFA;VENTOLIN HFA  Inhale 2 puffs into the lungs every 4 (four) hours as needed for wheezing or shortness of breath.     albuterol (5 MG/ML) 0.5% nebulizer solution  Commonly known as:  PROVENTIL  Take 0.5 mLs (2.5 mg total) by nebulization every 6 (six) hours as needed for wheezing or shortness of breath.     allopurinol 300 MG tablet  Commonly known as:  ZYLOPRIM  Take 300 mg by mouth daily.     atorvastatin 20 MG tablet  Commonly known as:  LIPITOR  Take 20 mg by mouth daily.     CALCIUM-VITAMIN D PO  Take 1 tablet by mouth daily.     digoxin 0.125 MG tablet  Commonly known as:  LANOXIN  Take 125 mcg by mouth daily.     furosemide 20 MG tablet  Commonly known as:  LASIX  Take 1 tablet (20 mg total) by mouth daily.     isosorbide mononitrate 30 MG 24 hr  tablet  Commonly known as:  IMDUR  Take 30 mg by mouth daily.     levofloxacin 750 MG tablet  Commonly known as:  LEVAQUIN  Take 1 tablet (750 mg total) by mouth every other day. Start 08/09/13  Start taking on:  08/09/2013     lisinopril 10 MG tablet  Commonly known as:  PRINIVIL,ZESTRIL  Take 10 mg by mouth daily.     loratadine 10 MG tablet  Commonly known as:  CLARITIN  Take 1 tablet (10 mg total) by mouth daily.     meclizine 12.5 MG tablet  Commonly known as:  ANTIVERT  Take 1 tablet (12.5 mg total) by mouth 2 (two) times daily as needed for dizziness.     metoprolol succinate 25 MG 24 hr tablet  Commonly known as:  TOPROL-XL  Take 25 mg by mouth daily.     midodrine 5 MG tablet  Commonly known as:  PROAMATINE  Take 1 tablet (5 mg total) by mouth 3 (three) times daily with  meals.     NITROSTAT 0.4 MG SL tablet  Generic drug:  nitroGLYCERIN  Place 0.4 mg under the tongue every 5 (five) minutes as needed for chest pain.     omeprazole 20 MG capsule  Commonly known as:  PRILOSEC  Take 20 mg by mouth every morning.     Rivaroxaban 15 MG Tabs tablet  Commonly known as:  XARELTO  Take 1 tablet (15 mg total) by mouth daily with supper.     VITAMIN B-12 PO  Take 1 tablet by mouth 2 (two) times daily.         The results of significant diagnostics from this hospitalization (including imaging, microbiology, ancillary and laboratory) are listed below for reference.    Significant Diagnostic Studies: Dg Chest 2 View  08/06/2013   CLINICAL DATA:  Cough and shortness of breath.  Chest pain.  EXAM: CHEST  2 VIEW  COMPARISON:  Chest radiograph performed 02/15/2013  FINDINGS: The lungs are well-aerated. Blunting of the left costophrenic angle may reflect chronic pleural thickening. Mild bilateral peripheral scarring is seen. Pulmonary vascularity is at the upper limits of normal. There is no evidence of pleural effusion or pneumothorax.  Mild right basilar airspace opacity may  reflect mild pneumonia. There appears to be a 1.3 cm spiculated nodule at the left lung base, not visualized on prior studies. This may be artifactual in nature, but CT of the chest would be helpful for further evaluation, on an elective nonemergent basis.  The heart is normal in size; the patient is status post median sternotomy, with evidence of prior CABG. A left-sided AICD is noted, with the lead ending at the right ventricle. An orphaned lead is noted ending at the coronary sinus. No acute osseous abnormalities are seen.  IMPRESSION: 1. Mild right basilar airspace opacity may reflect mild pneumonia. 2. Apparent 1.3 cm spiculated nodule at the left lung base, not visualized on prior studies. This may be artifactual in nature, but CT of the chest would be helpful for further evaluation, on an elective nonemergent basis. 3. Chronic bilateral lung changes seen, with peripheral scarring and blunting of the left costophrenic angle.   Electronically Signed   By: Garald Balding M.D.   On: 08/06/2013 22:42   Ct Chest Wo Contrast  08/07/2013   CLINICAL DATA:  Persistent cough and mild shortness of breath for 1 week. Possible spiculated left lower lobe nodule on chest x-ray.  EXAM: CT CHEST WITHOUT CONTRAST  TECHNIQUE: Multidetector CT imaging of the chest was performed following the standard protocol without IV contrast.  COMPARISON:  Chest radiographs 08/06/2013 and CT 06/30/2012 and 03/24/2011  FINDINGS: Multiple mildly prominent lymph nodes are present throughout the mediastinum and have slightly increased in size from the prior study, nonspecific. Prevascular lymph nodes measure up to 8 mm in short axis. Right paratracheal lymph nodes measure up to 12 mm in size. No enlarged hilar lymph nodes are identified. Left subclavian 3 lead ICD is seen with leads in the right atrium, right ventricle, and coronary sinus. Orphaned right subclavian approach right ventricular lead is also noted. Sequelae of prior CABG are  identified. There is left-sided cardiac chamber enlargement, similar to the prior study. Three-vessel coronary artery calcifications are noted. Coarse calcification involving the region of the left ventricular apex and septal wall is similar to the prior study and likely reflects prior myocardial infarction.  There is no pleural or pericardial effusion. Evaluation of the lung parenchyma is mildly limited by motion. There is  a 3 mm ground-glass nodule in the apical right upper lobe (series 3, image 7), unchanged. 4 mm ground-glass nodule in the superior segment of the right lower lobe is unchanged (image 22). 2 mm right upper lobe nodule is also unchanged (image 11).  Subpleural band like opacity in the right upper lobe adjacent to the minor and major fissures with some associated calcification as well as an adjacent 4 mm calcified nodule do not appear significantly changed and likely reflect sequelae of prior infection/ inflammation. Focal pleural thickening in the anterolateral left hemithorax is unchanged (image 14). Calcified left upper lobe nodule as well as adjacent 3 mm noncalcified upper lobe nodule are unchanged (image 21). Chronic parenchymal consolidation/scarring in the dependent left lower lobe adjacent to pleural calcification is unchanged from the 2 prior studies. Minimal subpleural opacity in the dependent right lower lobe with adjacent parenchymal calcification is unchanged and may also reflect scarring and/or atelectasis.  No new region of confluent airspace opacity is seen. Areas of mild subpleural reticulation within both lungs, most prominently in the lower lobes and lingula, are grossly similar to the prior studies, although evaluation is limited by motion artifact on all examinations.  The visualized portion of the upper abdomen is unremarkable. Multiple old posterior left rib fractures are noted. Multilevel bridging osteophytosis is present throughout the thoracic spine.  IMPRESSION: No  definite acute abnormality identified in the chest. Grossly stable appearance of chronic lung changes allowing for motion artifact as above.   Electronically Signed   By: Logan Bores   On: 08/07/2013 09:38     Microbiology: Recent Results (from the past 240 hour(s))  MRSA PCR SCREENING     Status: None   Collection Time    08/07/13  4:54 AM      Result Value Ref Range Status   MRSA by PCR NEGATIVE  NEGATIVE Final   Comment:            The GeneXpert MRSA Assay (FDA     approved for NASAL specimens     only), is one component of a     comprehensive MRSA colonization     surveillance program. It is not     intended to diagnose MRSA     infection nor to guide or     monitor treatment for     MRSA infections.     Labs: Basic Metabolic Panel:  Recent Labs Lab 08/06/13 1905 08/07/13 0823 08/08/13 0546  NA 139 135* 138  K 3.7 3.6* 3.8  CL 98 94* 98  CO2 26 28 27   GLUCOSE 145* 127* 92  BUN 19 18 17   CREATININE 1.06 0.99 1.10  CALCIUM 9.4 8.9 9.1   Liver Function Tests:  Recent Labs Lab 08/07/13 0823  AST 22  ALT 20  ALKPHOS 98  BILITOT 1.1  PROT 7.4  ALBUMIN 3.2*   No results found for this basename: LIPASE, AMYLASE,  in the last 168 hours No results found for this basename: AMMONIA,  in the last 168 hours CBC:  Recent Labs Lab 08/06/13 1905 08/08/13 0546  WBC 4.4 3.7*  HGB 9.1* 9.2*  HCT 27.6* 28.8*  MCV 88.2 89.2  PLT 148* 138*   Cardiac Enzymes:  Recent Labs Lab 08/07/13 0211 08/07/13 0923  TROPONINI <0.30 <0.30   BNP: No components found with this basename: POCBNP,  CBG:  Recent Labs Lab 08/07/13 0437 08/07/13 0749 08/07/13 1158 08/07/13 1649  GLUCAP 84 90 100* 119*    Time coordinating  discharge:  Greater than 30 minutes  Signed:  Orson Eva, DO [facility] Pager: [phone] 08/08/2013, 11:23 AM

## 2013-08-10 ENCOUNTER — Encounter: Payer: Self-pay | Admitting: Internal Medicine

## 2013-08-10 ENCOUNTER — Ambulatory Visit (INDEPENDENT_AMBULATORY_CARE_PROVIDER_SITE_OTHER): Payer: PRIVATE HEALTH INSURANCE | Admitting: *Deleted

## 2013-08-10 DIAGNOSIS — I4891 Unspecified atrial fibrillation: Secondary | ICD-10-CM

## 2013-08-10 LAB — MDC_IDC_ENUM_SESS_TYPE_INCLINIC
Battery Voltage: 2.5 V
Brady Statistic RA Percent Paced: 0 %
Brady Statistic RV Percent Paced: 99.6 %
HighPow Impedance: 41.8457
Implantable Pulse Generator Serial Number: 442538
Lead Channel Impedance Value: 350 Ohm
Lead Channel Pacing Threshold Amplitude: 0.75 V
Lead Channel Pacing Threshold Amplitude: 0.75 V
Lead Channel Pacing Threshold Pulse Width: 0.5 ms
Lead Channel Pacing Threshold Pulse Width: 0.5 ms
Lead Channel Sensing Intrinsic Amplitude: 11.6 mV
Lead Channel Setting Pacing Amplitude: 2.5 V
Lead Channel Setting Pacing Pulse Width: 0.5 ms
Lead Channel Setting Pacing Pulse Width: 0.5 ms
MDC IDC MSMT BATTERY REMAINING LONGEVITY: 3.7 mo
MDC IDC MSMT LEADCHNL LV PACING THRESHOLD AMPLITUDE: 0.75 V
MDC IDC MSMT LEADCHNL LV PACING THRESHOLD PULSEWIDTH: 0.5 ms
MDC IDC MSMT LEADCHNL RV IMPEDANCE VALUE: 437.5 Ohm
MDC IDC SESS DTM: 20150601093317
MDC IDC SET LEADCHNL LV PACING AMPLITUDE: 2.5 V
MDC IDC SET LEADCHNL RV SENSING SENSITIVITY: 2 mV
MDC IDC SET ZONE DETECTION INTERVAL: 300 ms
Zone Setting Detection Interval: 250 ms
Zone Setting Detection Interval: 375 ms

## 2013-08-10 IMAGING — CT CT ABD-PELV W/O CM
2 of 4 series · 17 of 46 positions shown, 19 images · non-contrast
Comparison: CT abdomen and pelvis 02/19/2011.

CLINICAL DATA: Bilateral flank pain.  Nausea and vomiting.

CT ABDOMEN AND PELVIS WITHOUT CONTRAST
TECHNIQUE: Multidetector CT imaging of the abdomen and pelvis was
performed following the standard protocol without intravenous
contrast.

[Series 2: stone 160 5.0 b31f st · axial · 0.77mm/px · z∈[-520,-116]mm · 14 of 89 slices shown, 16 images]
[im 4/89  soft-tissue]
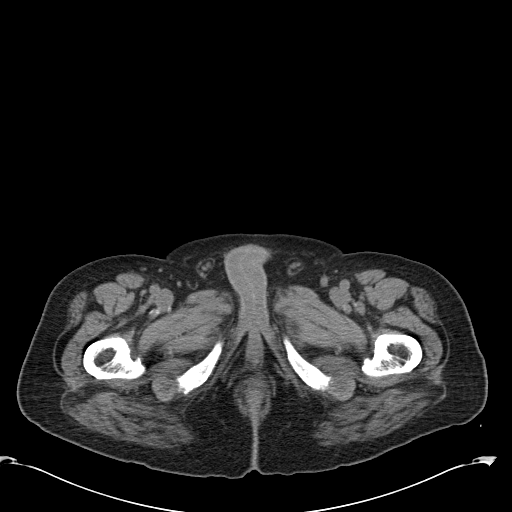
[im 4/89  bone]
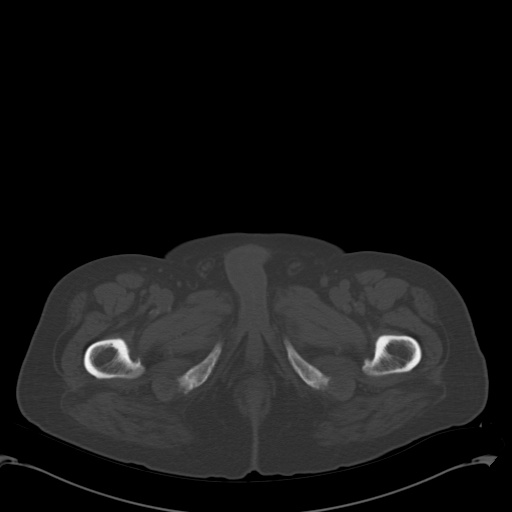
[im 12/89  soft-tissue]
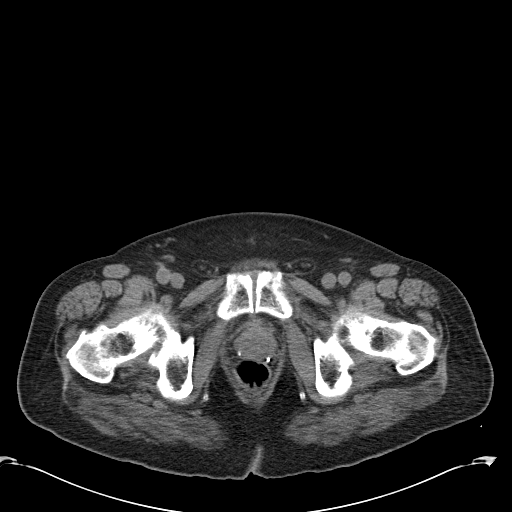
[im 19/89  soft-tissue]
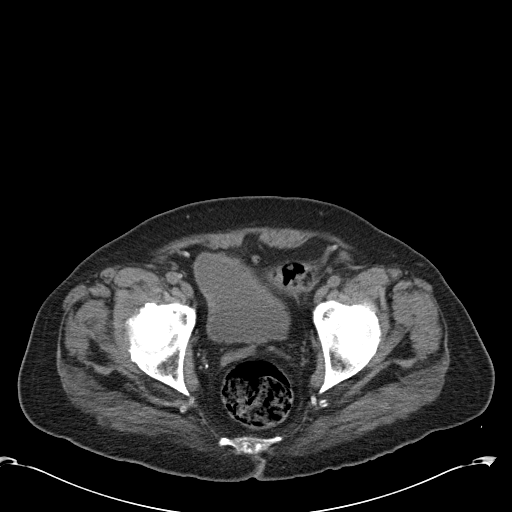
[im 23/89  soft-tissue]
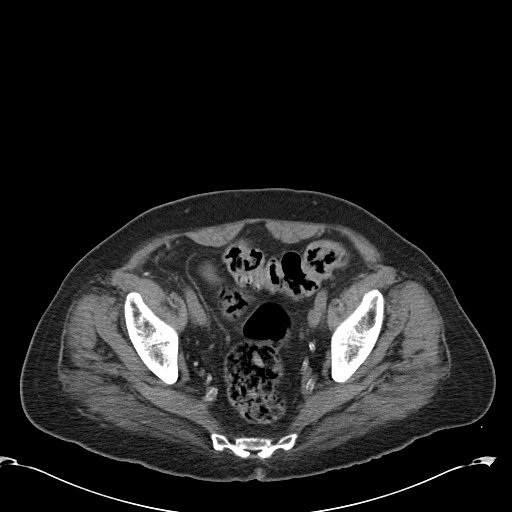
[im 30/89  soft-tissue]
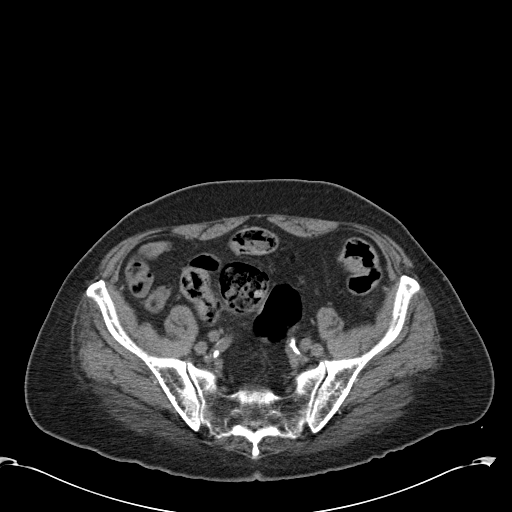
[im 37/89  soft-tissue]
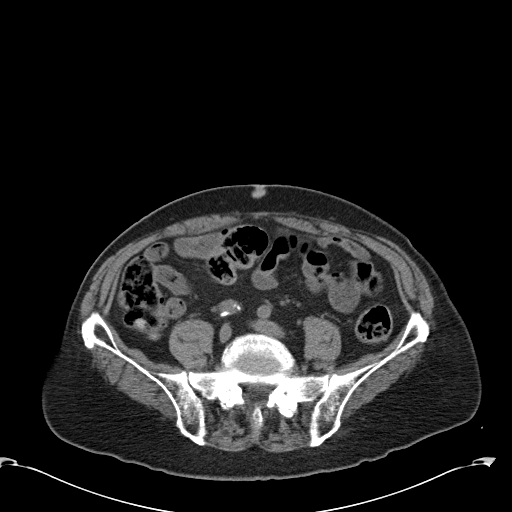
[im 41/89  soft-tissue]
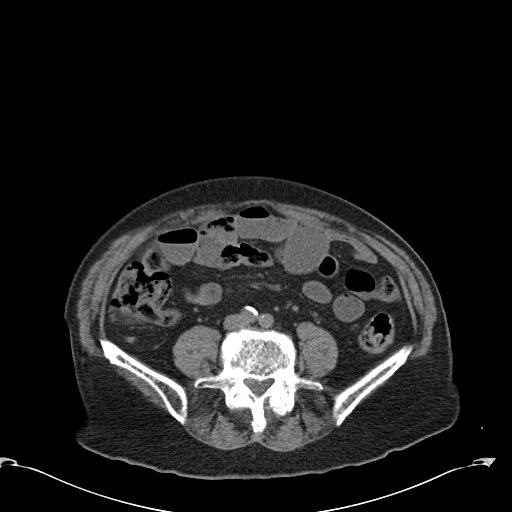
[im 48/89  soft-tissue]
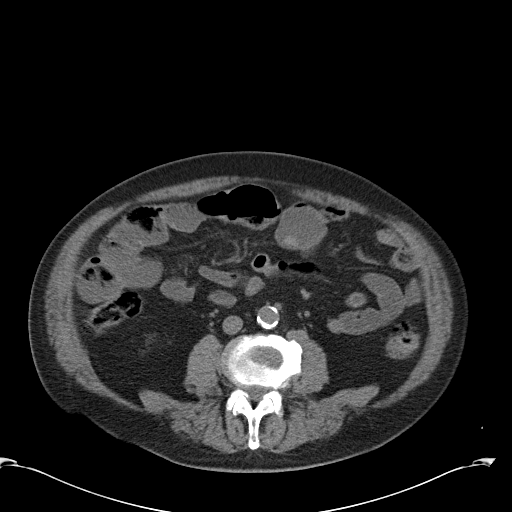
[im 52/89  soft-tissue]
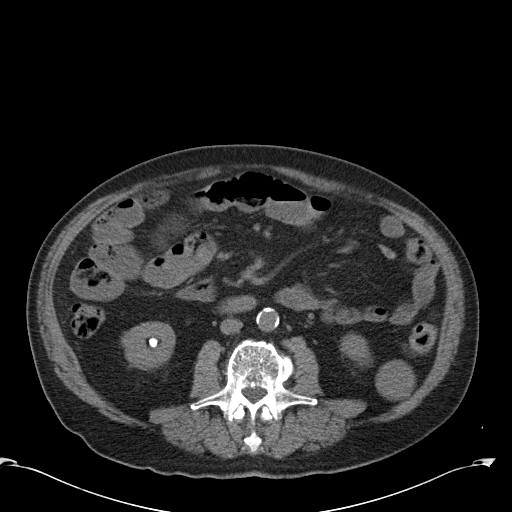
[im 52/89  bone]
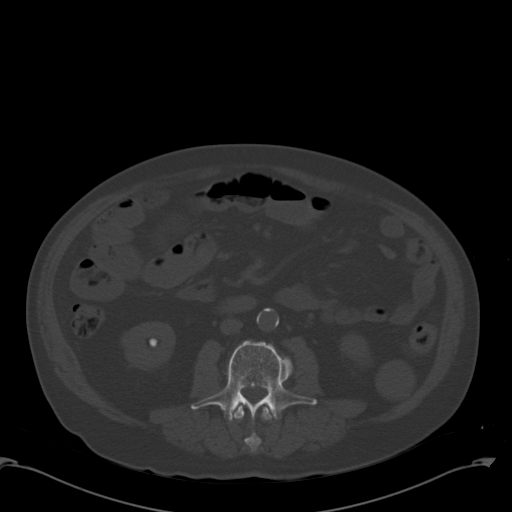
[im 59/89  soft-tissue]
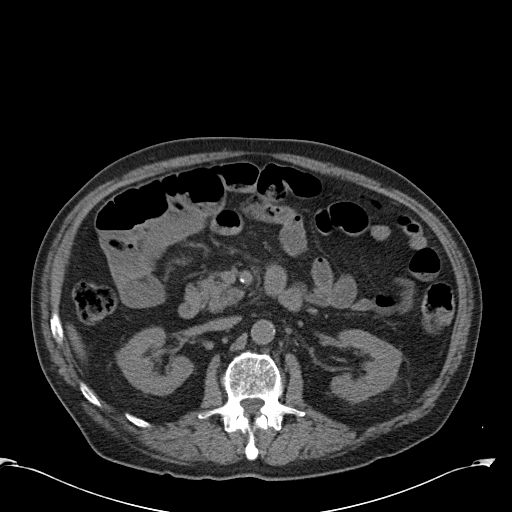
[im 67/89  soft-tissue]
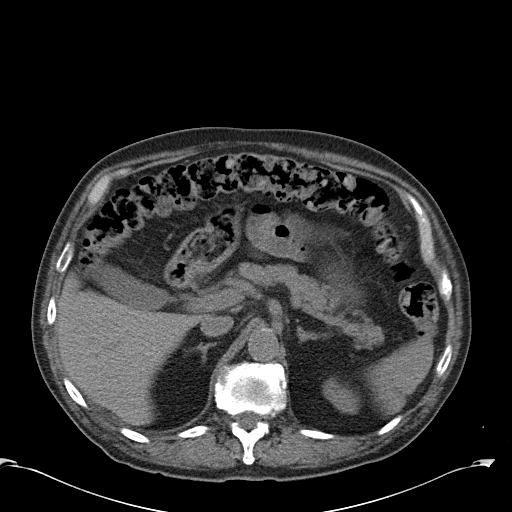
[im 70/89  soft-tissue]
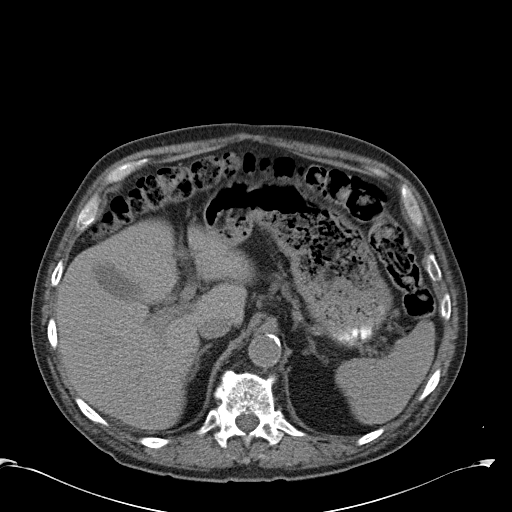
[im 78/89  soft-tissue]
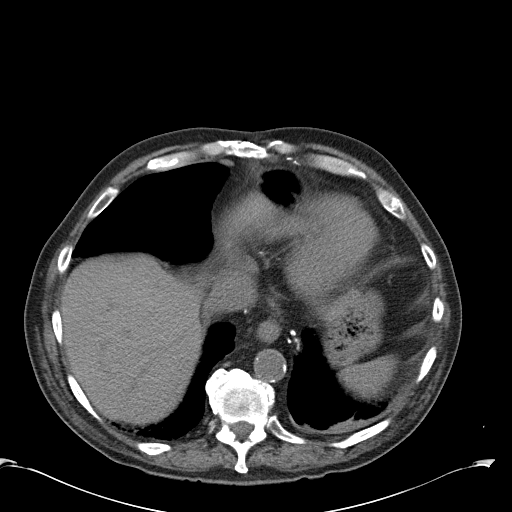
[im 85/89  soft-tissue]
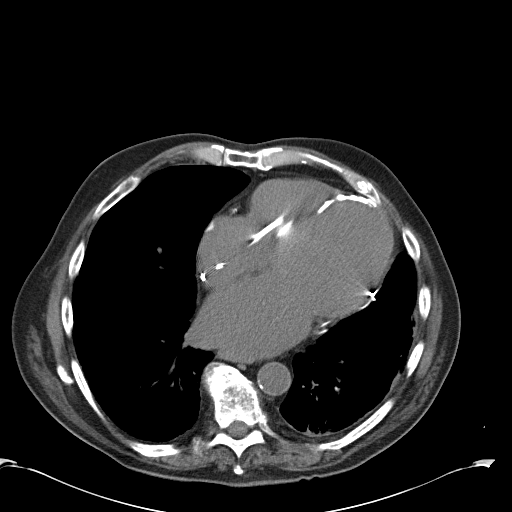

[Series 5: coronals cor · coronal · 0.91mm/px · 3 of 86 slices shown]
[im 29/86  soft-tissue]
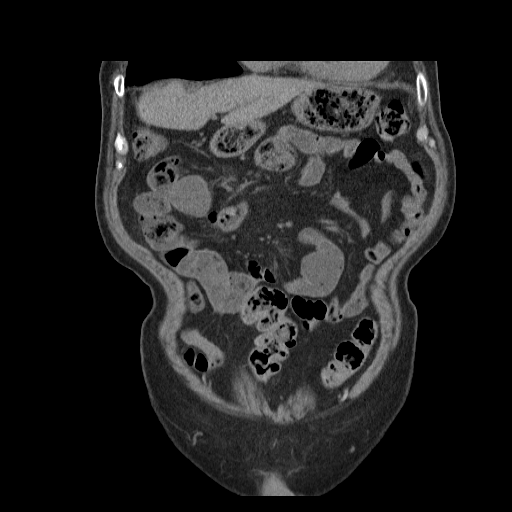
[im 38/86  soft-tissue]
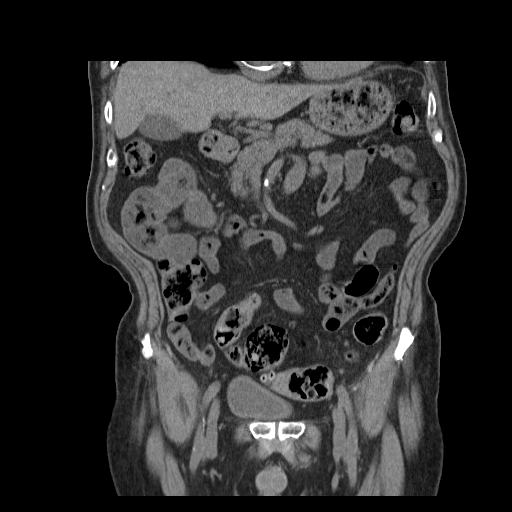
[im 48/86  soft-tissue]
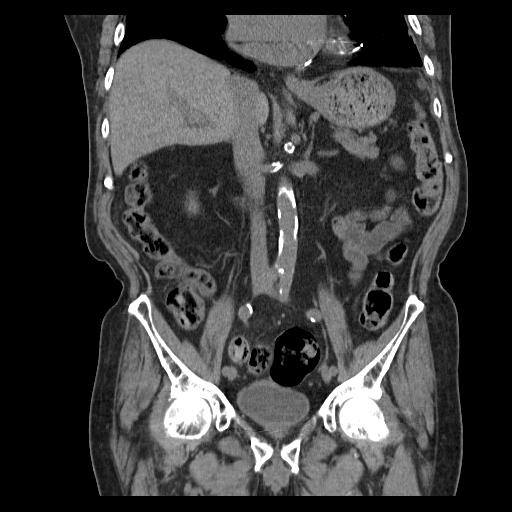

[17 of 46 positions shown; findings below may reference images not displayed]

FINDINGS: Calcified pleural plaque and pleural thickening in the
left lung base are unchanged.  There is no right pleural effusion
or pericardial effusion.  Calcification of the left ventricular
apex is identified likely due to old infarct.  Lung bases appear
emphysematous.  There is cardiomegaly.

A 0.7 cm nonobstructing stone is identified in the lower pole of
the right kidney.  Exophytic cyst off the lower pole of the left
kidney is noted.  There is no hydronephrosis on the left or right
and no ureteral stones are present.

The gallbladder, liver, spleen, adrenal glands and pancreas appear
normal.  Fairly large stool burden throughout the colon is noted.
The colon is otherwise normal in appearance.  Stomach, small bowel
and appendix appear normal.  There is no lymphadenopathy or fluid.
Large Schmorl's node versus remote superior endplate compression
fracture of L4 is unchanged.
IMPRESSION: 1.  No acute finding.
2.  0.7 cm nonobstructing stone lower pole right kidney.
3.  Marked cardiomegaly.
4.  Emphysema.  Scar in the left lung base also noted.

## 2013-08-10 NOTE — Progress Notes (Signed)
CRT-D device check in office. Thresholds and sensing consistent with previous device measurements. Lead impedance trends stable over time.  No ventricular arrhythmia episodes recorded. Patient bi-ventricularly pacing 99% of the time. Device programmed with appropriate safety margins. Audible/vibratory alerts demonstrated for patient and pt aware to contact office if felt. No changes made this session. Estimated longevity 3.7 months. ROV 09-10-13 @ 900 with device clinic and October with [facility].

## 2013-08-18 IMAGING — US US CAROTID DUPLEX BILAT
1 series · 13 of 24 positions shown · non-contrast
Comparison: CT cervical spine 12/21/2011;

CLINICAL DATA: Carotid stenosis, syncope

BILATERAL CAROTID DUPLEX ULTRASOUND
TECHNIQUE: Gray scale imaging, color Doppler and duplex ultrasound
was performed of bilateral carotid and vertebral arteries in the
neck.

[Series 1: us carotid duplex bilat · 0.09mm/px · 13 of 63 slices shown]
[im 1/63]
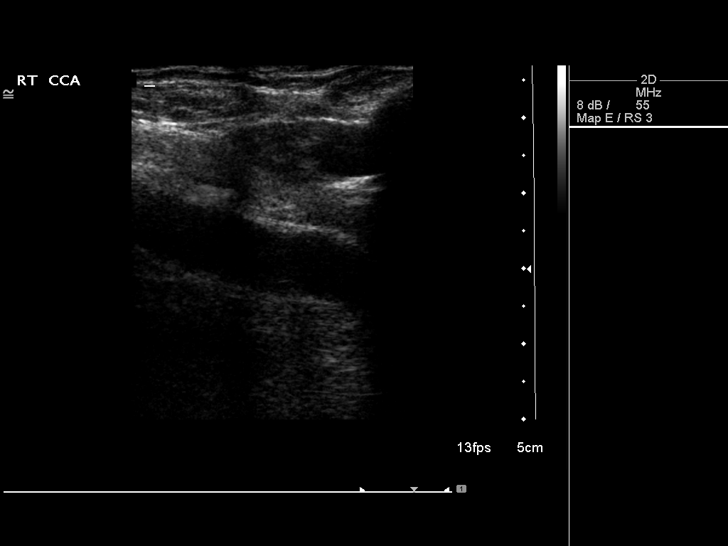
[im 6/63]
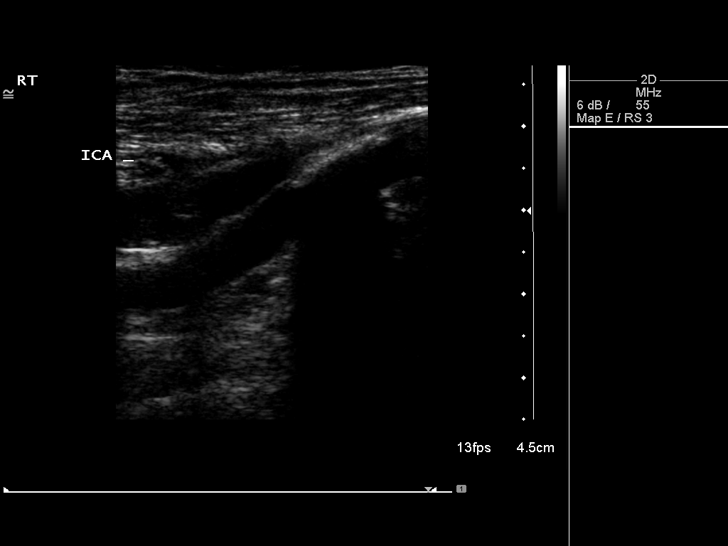
[im 11/63]
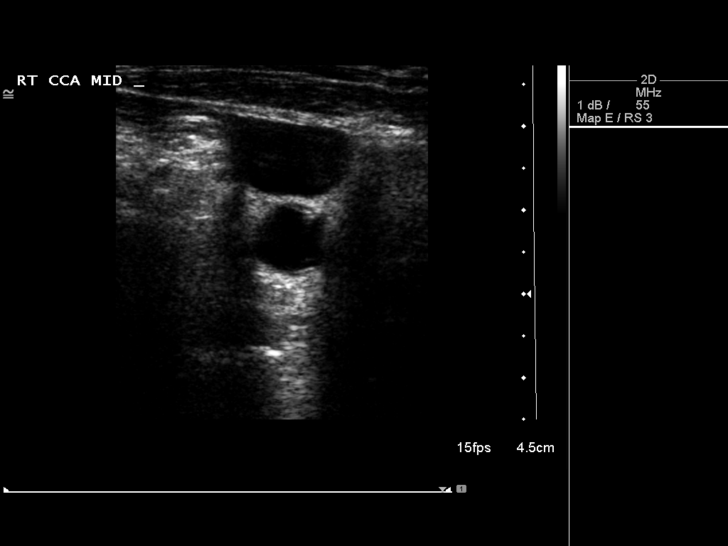
[im 17/63]
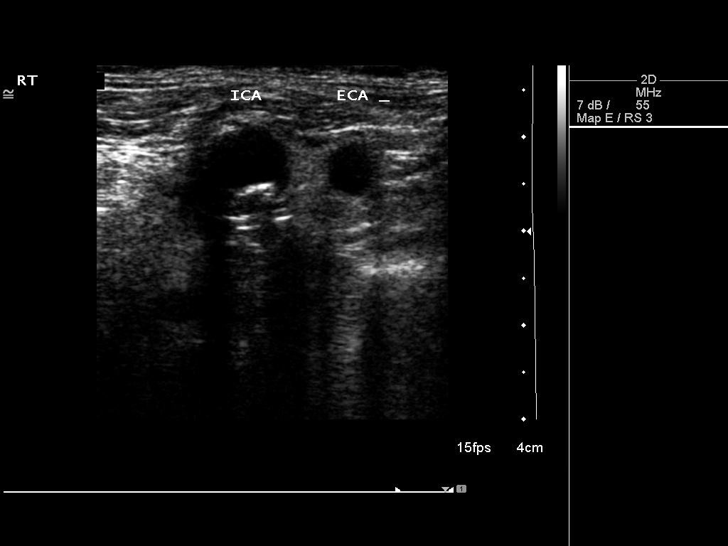
[im 22/63]
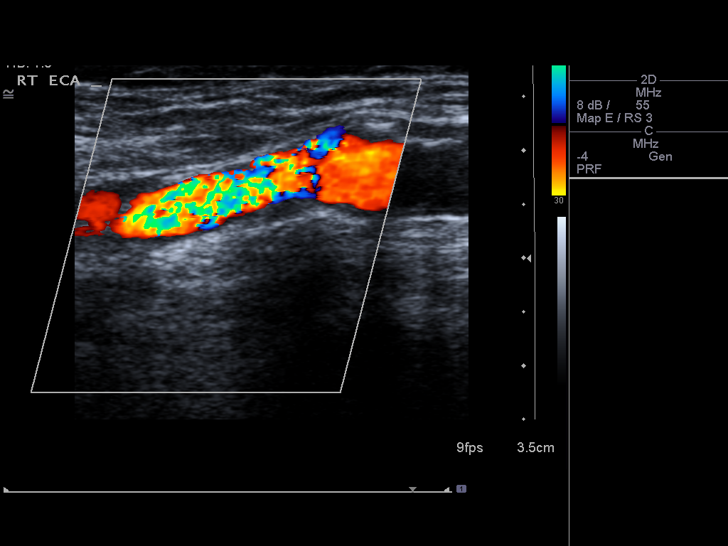
[im 27/63]
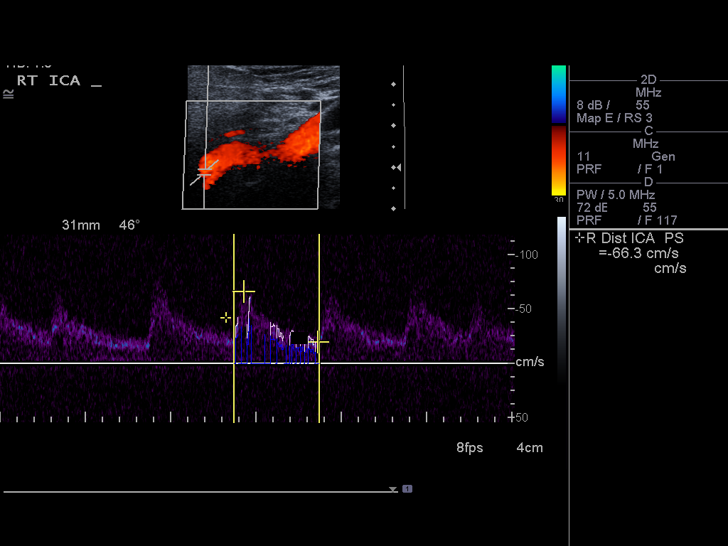
[im 33/63]
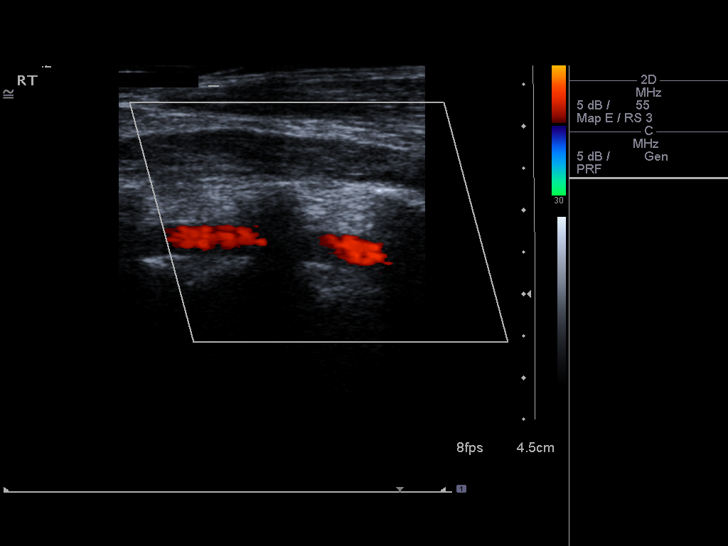
[im 36/63]
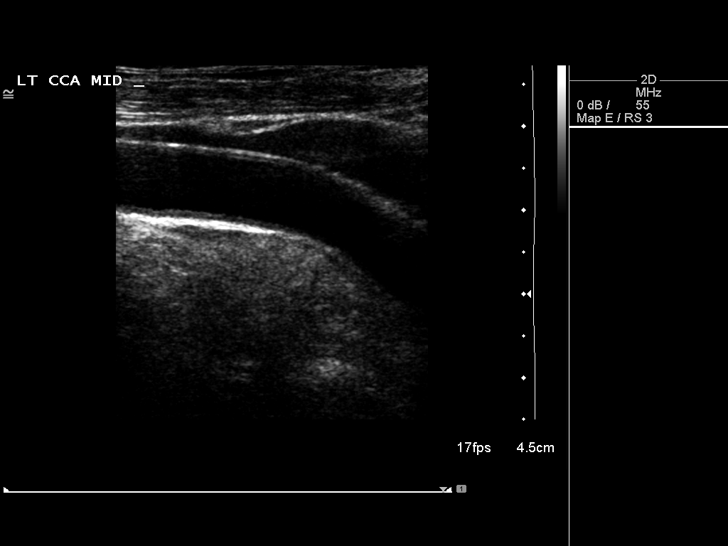
[im 41/63]
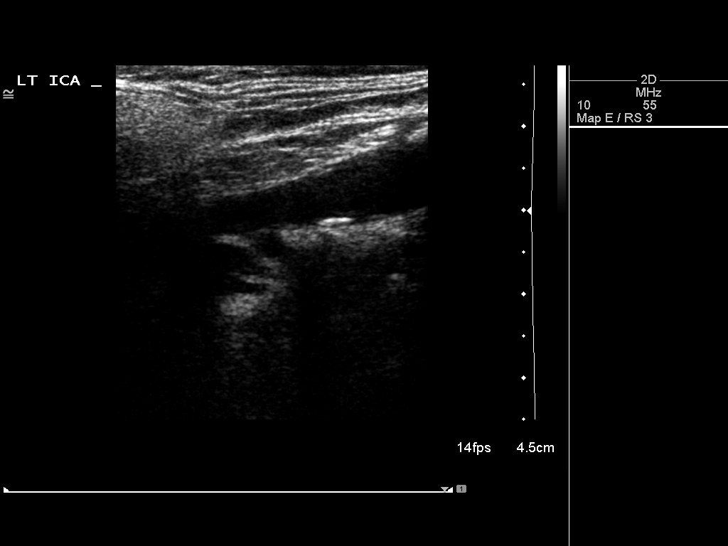
[im 46/63]
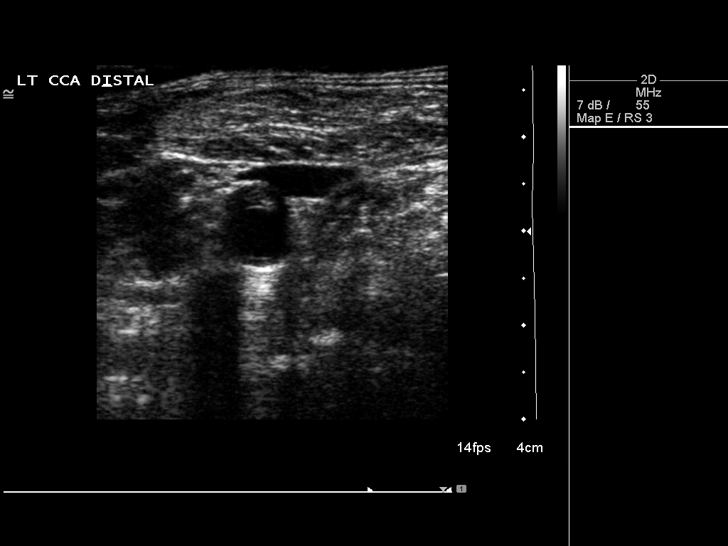
[im 52/63]
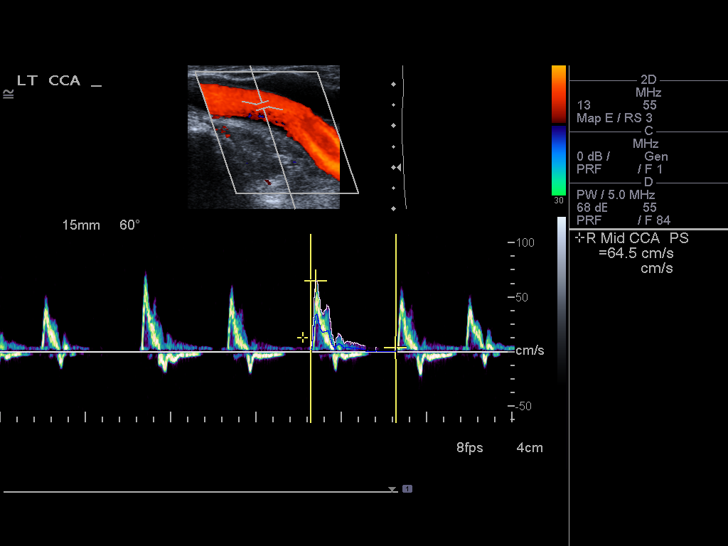
[im 57/63]
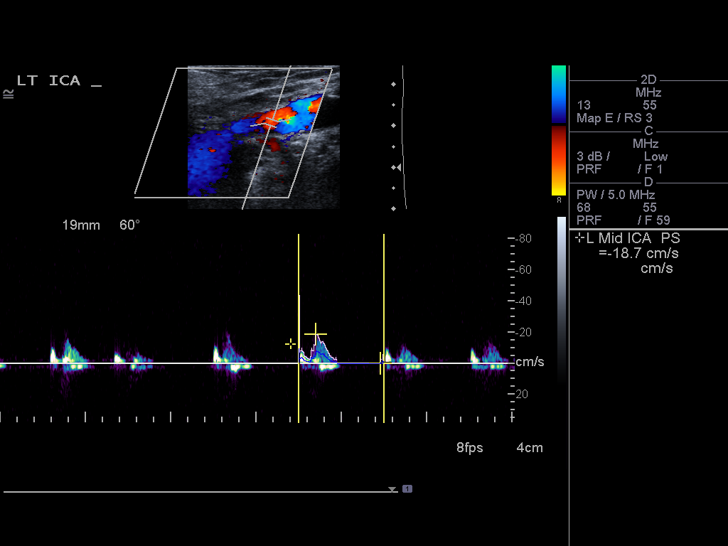
[im 63/63]
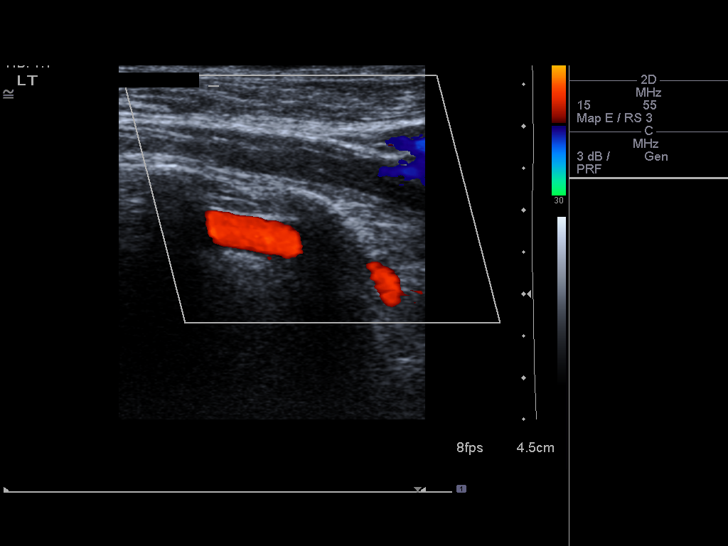

[13 of 24 positions shown; findings below may reference images not displayed]

CT scan of the head with
and without contrast 02/09/2011

Criteria:  Quantification of carotid stenosis is based on velocity
parameters that correlate the residual internal carotid diameter
with NASCET-based stenosis levels, using the diameter of the distal
internal carotid lumen as the denominator for stenosis measurement.

The following velocity measurements were obtained:

                 PEAK SYSTOLIC/END DIASTOLIC
RIGHT
ICA:                        147/42cm/sec
CCA:                        96/30cm/sec
SYSTOLIC ICA/CCA RATIO:
DIASTOLIC ICA/CCA RATIO:
ECA:                        178cm/sec

LEFT
ICA:                        32/4cm/sec
CCA:                        67/7cm/sec
SYSTOLIC ICA/CCA RATIO:
DIASTOLIC ICA/CCA RATIO:
ECA:                        136cm/sec
FINDINGS: RIGHT CAROTID ARTERY: Irregular calcified noncalcified atheromatous
plaque in the carotid bulb extending into the origin of the
internal carotid artery.  There is a 50 - 69% stenosis by peak
systolic velocity criteria.  Color Doppler evaluation demonstrates
turbulence as does spectral broadening on spectral waveform
analysis.

RIGHT VERTEBRAL ARTERY:  Patent with normal antegrade flow

LEFT CAROTID ARTERY: Eccentric calcified and noncalcified plaque in
the carotid bulb extends into the proximal ICA.  Overall, the
amount of plaque is less on the left than the right.  No definite
stenosis by peak systolic velocity criteria.  The vessel is highly
tortuous.  However, there is a burst systolic upstroke followed by
relative loss of diastolic flow suggesting high-grade distal
stenosis versus occlusion.

LEFT VERTEBRAL ARTERY:  Patent with normal antegrade flow
IMPRESSION: 1. Moderate calcified and noncalcified atheromatous plaque results
in a [DATE]% diameter stenosis in the right internal carotid
artery.

2.  Mild - moderate calcified and noncalcified plaque in the left
carotid bulb and internal carotid artery results in less than 50%
diameter stenosis proximally.  However, there is externalization of
the spectral waveform of the internal and common carotid arteries
suggesting high-grade distal ICA stenosis versus occlusion.

If clinically warranted, CTA or MRA of the head and neck could
further evaluate.

These results will be called to the ordering clinician or
representative by the Radiologist Assistant, and communication
documented in the PACS Dashboard.

[REDACTED]

## 2013-09-10 ENCOUNTER — Ambulatory Visit (INDEPENDENT_AMBULATORY_CARE_PROVIDER_SITE_OTHER): Payer: PRIVATE HEALTH INSURANCE | Admitting: *Deleted

## 2013-09-10 DIAGNOSIS — R55 Syncope and collapse: Secondary | ICD-10-CM

## 2013-09-10 LAB — MDC_IDC_ENUM_SESS_TYPE_INCLINIC
Battery Remaining Longevity: 2.3 mo
Battery Voltage: 2.48 V
HighPow Impedance: 39.0134
Lead Channel Impedance Value: 337.5 Ohm
Lead Channel Impedance Value: 425 Ohm
Lead Channel Sensing Intrinsic Amplitude: 11.6 mV
Lead Channel Setting Pacing Amplitude: 2.5 V
Lead Channel Setting Pacing Pulse Width: 0.5 ms
Lead Channel Setting Pacing Pulse Width: 0.5 ms
Lead Channel Setting Sensing Sensitivity: 2 mV
MDC IDC PG SERIAL: 442538
MDC IDC SESS DTM: 20150702092549
MDC IDC SET LEADCHNL RV PACING AMPLITUDE: 2.5 V
MDC IDC SET ZONE DETECTION INTERVAL: 250 ms
MDC IDC SET ZONE DETECTION INTERVAL: 375 ms
MDC IDC STAT BRADY RA PERCENT PACED: 0 %
MDC IDC STAT BRADY RV PERCENT PACED: 99.59 %
Zone Setting Detection Interval: 300 ms

## 2013-09-10 NOTE — Progress Notes (Signed)
Battery check only today.  2.48V, estimated longevity 0-3 months.  ROV in August for recheck.

## 2013-09-17 ENCOUNTER — Encounter: Payer: Self-pay | Admitting: Internal Medicine

## 2013-10-12 ENCOUNTER — Ambulatory Visit (INDEPENDENT_AMBULATORY_CARE_PROVIDER_SITE_OTHER): Payer: PRIVATE HEALTH INSURANCE | Admitting: *Deleted

## 2013-10-12 DIAGNOSIS — I4891 Unspecified atrial fibrillation: Secondary | ICD-10-CM

## 2013-10-12 DIAGNOSIS — I509 Heart failure, unspecified: Secondary | ICD-10-CM

## 2013-10-12 DIAGNOSIS — I5022 Chronic systolic (congestive) heart failure: Secondary | ICD-10-CM

## 2013-10-12 DIAGNOSIS — I482 Chronic atrial fibrillation, unspecified: Secondary | ICD-10-CM

## 2013-10-12 LAB — MDC_IDC_ENUM_SESS_TYPE_INCLINIC
Battery Remaining Longevity: 1.2 mo
Brady Statistic RA Percent Paced: 0 %
Brady Statistic RV Percent Paced: 99.57 %
Date Time Interrogation Session: 20150803093056
HighPow Impedance: 43.0145
Implantable Pulse Generator Serial Number: 442538
Lead Channel Impedance Value: 500 Ohm
Lead Channel Sensing Intrinsic Amplitude: 11.6 mV
Lead Channel Setting Pacing Amplitude: 2.5 V
Lead Channel Setting Pacing Amplitude: 2.5 V
Lead Channel Setting Pacing Pulse Width: 0.5 ms
Lead Channel Setting Pacing Pulse Width: 0.5 ms
Lead Channel Setting Sensing Sensitivity: 2 mV
MDC IDC MSMT BATTERY VOLTAGE: 2.47 V
MDC IDC MSMT LEADCHNL LV IMPEDANCE VALUE: 375 Ohm
MDC IDC SET ZONE DETECTION INTERVAL: 250 ms
Zone Setting Detection Interval: 300 ms
Zone Setting Detection Interval: 375 ms

## 2013-10-12 NOTE — Progress Notes (Signed)
CRT-D check in clinic for batt estimate only (N/C). Batt voltage 2.47V (ERI 2.45V). No episodes recorded. Testing was not performed. No changes made this session. Patient will follow up with the [facility] on 9-9 and with [facility] in 12-2013(billable).

## 2013-10-19 ENCOUNTER — Telehealth: Payer: Self-pay | Admitting: Cardiology

## 2013-10-19 ENCOUNTER — Telehealth: Payer: Self-pay | Admitting: Internal Medicine

## 2013-10-19 NOTE — Telephone Encounter (Signed)
New message     Pt says his pacemaker went off twice----3am this morning and a few minutes later it went off again

## 2013-10-19 NOTE — Telephone Encounter (Signed)
Pt daughter called and stated that pt ICD started vibrating around 3AM. Pt daughter wants to know if she needs to bring pt into office. Pt rescheduled for 9-10 at 8:30 AM pt daughter agreed to appt.

## 2013-10-29 ENCOUNTER — Encounter: Payer: Self-pay | Admitting: Internal Medicine

## 2013-11-19 ENCOUNTER — Ambulatory Visit (INDEPENDENT_AMBULATORY_CARE_PROVIDER_SITE_OTHER): Payer: PRIVATE HEALTH INSURANCE | Admitting: Internal Medicine

## 2013-11-19 ENCOUNTER — Encounter: Payer: Self-pay | Admitting: *Deleted

## 2013-11-19 ENCOUNTER — Encounter: Payer: Self-pay | Admitting: Internal Medicine

## 2013-11-19 VITALS — BP 100/52 | HR 63 | Ht 66.0 in | Wt 128.0 lb

## 2013-11-19 DIAGNOSIS — Z95810 Presence of automatic (implantable) cardiac defibrillator: Secondary | ICD-10-CM

## 2013-11-19 DIAGNOSIS — I5022 Chronic systolic (congestive) heart failure: Secondary | ICD-10-CM

## 2013-11-19 DIAGNOSIS — I482 Chronic atrial fibrillation, unspecified: Secondary | ICD-10-CM

## 2013-11-19 DIAGNOSIS — I4891 Unspecified atrial fibrillation: Secondary | ICD-10-CM

## 2013-11-19 DIAGNOSIS — I428 Other cardiomyopathies: Secondary | ICD-10-CM

## 2013-11-19 DIAGNOSIS — I509 Heart failure, unspecified: Secondary | ICD-10-CM

## 2013-11-19 LAB — CBC WITH DIFFERENTIAL/PLATELET
BASOS PCT: 0.1 % (ref 0.0–3.0)
Basophils Absolute: 0 10*3/uL (ref 0.0–0.1)
EOS ABS: 0 10*3/uL (ref 0.0–0.7)
Eosinophils Relative: 1.1 % (ref 0.0–5.0)
HEMATOCRIT: 27.5 % — AB (ref 39.0–52.0)
Hemoglobin: 9.1 g/dL — ABNORMAL LOW (ref 13.0–17.0)
LYMPHS ABS: 0.9 10*3/uL (ref 0.7–4.0)
Lymphocytes Relative: 26.4 % (ref 12.0–46.0)
MCHC: 33.1 g/dL (ref 30.0–36.0)
MCV: 87.5 fl (ref 78.0–100.0)
MONO ABS: 0.4 10*3/uL (ref 0.1–1.0)
Monocytes Relative: 10.5 % (ref 3.0–12.0)
NEUTROS ABS: 2.2 10*3/uL (ref 1.4–7.7)
Neutrophils Relative %: 61.9 % (ref 43.0–77.0)
Platelets: 181 10*3/uL (ref 150.0–400.0)
RBC: 3.14 Mil/uL — AB (ref 4.22–5.81)
RDW: 17.9 % — ABNORMAL HIGH (ref 11.5–15.5)
WBC: 3.5 10*3/uL — ABNORMAL LOW (ref 4.0–10.5)

## 2013-11-19 LAB — MDC_IDC_ENUM_SESS_TYPE_INCLINIC
Brady Statistic RA Percent Paced: 0 %
Date Time Interrogation Session: 20150910095631
HighPow Impedance: 40.7065
Lead Channel Pacing Threshold Amplitude: 0.75 V
Lead Channel Pacing Threshold Pulse Width: 0.5 ms
Lead Channel Pacing Threshold Pulse Width: 0.5 ms
Lead Channel Setting Pacing Amplitude: 2.5 V
Lead Channel Setting Pacing Pulse Width: 0.5 ms
MDC IDC MSMT BATTERY REMAINING LONGEVITY: 0 mo
MDC IDC MSMT BATTERY VOLTAGE: 2.39 V
MDC IDC MSMT LEADCHNL LV IMPEDANCE VALUE: 350 Ohm
MDC IDC MSMT LEADCHNL RV IMPEDANCE VALUE: 437.5 Ohm
MDC IDC MSMT LEADCHNL RV PACING THRESHOLD AMPLITUDE: 0.75 V
MDC IDC MSMT LEADCHNL RV SENSING INTR AMPL: 11.6 mV
MDC IDC PG SERIAL: 442538
MDC IDC SET LEADCHNL RV PACING AMPLITUDE: 2.5 V
MDC IDC SET LEADCHNL RV PACING PULSEWIDTH: 0.5 ms
MDC IDC SET LEADCHNL RV SENSING SENSITIVITY: 2 mV
MDC IDC SET ZONE DETECTION INTERVAL: 300 ms
MDC IDC SET ZONE DETECTION INTERVAL: 375 ms
MDC IDC STAT BRADY RV PERCENT PACED: 99.62 %
Zone Setting Detection Interval: 250 ms

## 2013-11-19 LAB — BASIC METABOLIC PANEL
BUN: 16 mg/dL (ref 6–23)
CHLORIDE: 98 meq/L (ref 96–112)
CO2: 30 meq/L (ref 19–32)
CREATININE: 1 mg/dL (ref 0.4–1.5)
Calcium: 9.2 mg/dL (ref 8.4–10.5)
GFR: 74.82 mL/min (ref >60.00)
Glucose, Bld: 86 mg/dL (ref 70–99)
Potassium: 3.7 mEq/L (ref 3.5–5.1)
Sodium: 135 mEq/L (ref 135–145)

## 2013-11-19 NOTE — Assessment & Plan Note (Signed)
His symptoms are class 2. No change in meds. He will maintain a low sodium diet.

## 2013-11-19 NOTE — Assessment & Plan Note (Signed)
His St. Jude ICD is at [employer]. I plan to switch him to a BiV ppm provided we can finding a pacing configuration that will successfully pace the patient.

## 2013-11-19 NOTE — Patient Instructions (Signed)
Generator Change    See Instruction sheet

## 2013-11-19 NOTE — Progress Notes (Signed)
HPI [name] returns today for followup. He is a 80 year old man with an ischemic cardiomyopathy, chronic systolic heart failure, symptomatic bradycardia, status post biventricular ICD implantation. He also has chronic atrial fibrillation. In the interim, the patient has been stable. He denies any recent ICD shock, chest pain, syncope, or peripheral edema. He has reached ERI on his device for which he has never had an appropriate ICD shock. Allergies  Allergen Reactions  . Aspirin Hives     Current Outpatient Prescriptions  Medication Sig Dispense Refill  . acetaminophen (TYLENOL) 325 MG tablet Take 325 mg by mouth every 6 (six) hours as needed (pain).       [name] albuterol (PROVENTIL HFA;VENTOLIN HFA) 108 (90 BASE) MCG/ACT inhaler Inhale 2 puffs into the lungs every 4 (four) hours as needed for wheezing or shortness of breath.      [name] albuterol (PROVENTIL) (5 MG/ML) 0.5% nebulizer solution Take 0.5 mLs (2.5 mg total) by nebulization every 6 (six) hours as needed for wheezing or shortness of breath.  20 mL  5  . allopurinol (ZYLOPRIM) 300 MG tablet Take 300 mg by mouth daily.        [name] atorvastatin (LIPITOR) 20 MG tablet Take 20 mg by mouth daily.        [name] CALCIUM-VITAMIN D PO Take 1 tablet by mouth daily.       . Cyanocobalamin (VITAMIN B-12 PO) Take 1 tablet by mouth 2 (two) times daily.       . digoxin (LANOXIN) 0.125 MG tablet Take 125 mcg by mouth daily.        . furosemide (LASIX) 20 MG tablet Take 1 tablet (20 mg total) by mouth daily.  30 tablet  5  . isosorbide mononitrate (IMDUR) 30 MG 24 hr tablet Take 30 mg by mouth daily.      [name] lisinopril (PRINIVIL,ZESTRIL) 10 MG tablet Take 10 mg by mouth daily.      [name] loratadine (CLARITIN) 10 MG tablet Take 1 tablet (10 mg total) by mouth daily.  30 tablet  6  . meclizine (ANTIVERT) 12.5 MG tablet Take 1 tablet (12.5 mg total) by mouth 2 (two) times daily as needed for dizziness.  30 tablet  0  . metoprolol succinate (TOPROL-XL) 25 MG 24 hr tablet  Take 25 mg by mouth daily.      . midodrine (PROAMATINE) 5 MG tablet Take 1 tablet (5 mg total) by mouth 3 (three) times daily with meals.  90 tablet  0  . NITROSTAT 0.4 MG SL tablet Place 0.4 mg under the tongue every 5 (five) minutes as needed for chest pain.       . Rivaroxaban (XARELTO) 15 MG TABS tablet Take 1 tablet (15 mg total) by mouth daily with supper.  30 tablet  0   No current facility-administered medications for this visit.     Past Medical History  Diagnosis Date  . Diabetes mellitus   . Hypertension   . Chronic systolic CHF (congestive heart failure)     a. 12/2012 Echo: EF 20-25%, mid-dist antsept AK, mod dil LA.  [name] CAD (coronary artery disease)     a. 07/2002 CABG x 3: LIMA->LAD, VG->Diag, VG->OM;  b. 06/2006 Cath: LM 50-60ost/p, LAD patent mid stent, D1 sev dzs, D2 patent stent, LCX nl, OM2 sev sten prox, RCA large/nl, VG->Diag nl, VG->OM nl, LIMA->LAD atretic, EF 30%.  . Ischemic cardiomyopathy     a. 06/2006 s/p SJM Promote RF, model 3207-36 BiV ICD, ser #:  [id number].  [name] Hypercholesterolemia   . Black lung disease   . Nephrolithiasis 09/2000  . CVA (cerebral vascular accident)   . Prostate cancer 11/01/10    gleason 7, 8, 9, gold seeds 02/08/11  . Lung cancer   . COPD (chronic obstructive pulmonary disease)     a. On home O2.  [name] GERD (gastroesophageal reflux disease)   . Arthritis   . Gout   . Pacemaker   . Carotid artery occlusion     ROS:   All systems reviewed and negative except as noted in the HPI.   Past Surgical History  Procedure Laterality Date  . Colonoscopy    . Esophagogastroduodenoscopy  02/19/2011    Procedure: ESOPHAGOGASTRODUODENOSCOPY (EGD);  Surgeon: Jeryl Columbia, MD;  Location: [facility] ENDOSCOPY;  Service: Endoscopy;  Laterality: N/A;  . Upper endoscpopy    . Coronary angioplasty with stent placement  07/1997; 08/1997;03/1998  . Coronary artery bypass graft  07/2002    CABG X3  . Incision and drainage of wound  08/2002    right thigh; S/P EVH   . Insert / replace / remove pacemaker  1979; 1992; 01/2000;  . Insert / replace / remove pacemaker  09/2003; 06/2006    w/AICD  . Insert / replace / remove pacemaker  12/2004    pacmaker explant  . Shoulder arthroscopy w/ rotator cuff repair  05/2008    left     Family History  Problem Relation Age of Onset  . Alzheimer's disease Father 36  . Cancer Father 76    metastatic prostate cancer  . Diabetes Sister 23  . Diabetes Brother   . Diabetes Brother   . Diabetes Brother   . Hypotension Neg Hx   . Malignant hyperthermia Neg Hx   . Pseudochol deficiency Neg Hx      History   Social History  . Marital Status: Widowed    Spouse Name: N/A    Number of Children: N/A  . Years of Education: N/A   Occupational History  . retired     Equities trader   Social History Main Topics  . Smoking status: Former Smoker    Types: Cigarettes    Quit date: 03/12/1949  . Smokeless tobacco: Current User    Types: Chew  . Alcohol Use: No     Comment: drank until per pt 03/19/11  . Drug Use: No  . Sexual Activity: Yes   Other Topics Concern  . Not on file   Social History Narrative  . No narrative on file     BP 100/52  Pulse 63  Ht 5\' 6"  (1.676 m)  Wt 128 lb (58.06 kg)  BMI 20.67 kg/m2  Physical Exam:  Well appearing elderly man, NAD HEENT: Unremarkable Neck:  7 cm JVD, no thyromegally Lungs:  Clear with no wheezes, rales, or rhonchi. HEART:  IRegular rate rhythm, 2/6 systolic murmur, no rubs, no clicks Abd:  soft, positive bowel sounds, no organomegally, no rebound, no guarding Ext:  2 plus pulses, no edema, no cyanosis, no clubbing Skin:  No rashes no nodules Neuro:  CN II through XII intact, motor grossly intact  DEVICE  Normal device function.  See PaceArt for details. ICD at [facility].   Assess/Plan:

## 2013-11-20 ENCOUNTER — Encounter: Payer: Self-pay | Admitting: Internal Medicine

## 2013-11-26 ENCOUNTER — Encounter: Payer: Self-pay | Admitting: Internal Medicine

## 2013-11-26 NOTE — Telephone Encounter (Signed)
LMOVM for pt to return call 

## 2013-11-27 ENCOUNTER — Encounter (HOSPITAL_COMMUNITY): Payer: Self-pay | Admitting: Pharmacy Technician

## 2013-11-27 IMAGING — CR DG ANKLE COMPLETE 3+V*R*
3 series · 3 of 3 positions shown · non-contrast
Comparison: None.

CLINICAL DATA: Pain

RIGHT ANKLE - COMPLETE 3+ VIEW

[x ankle ap right]
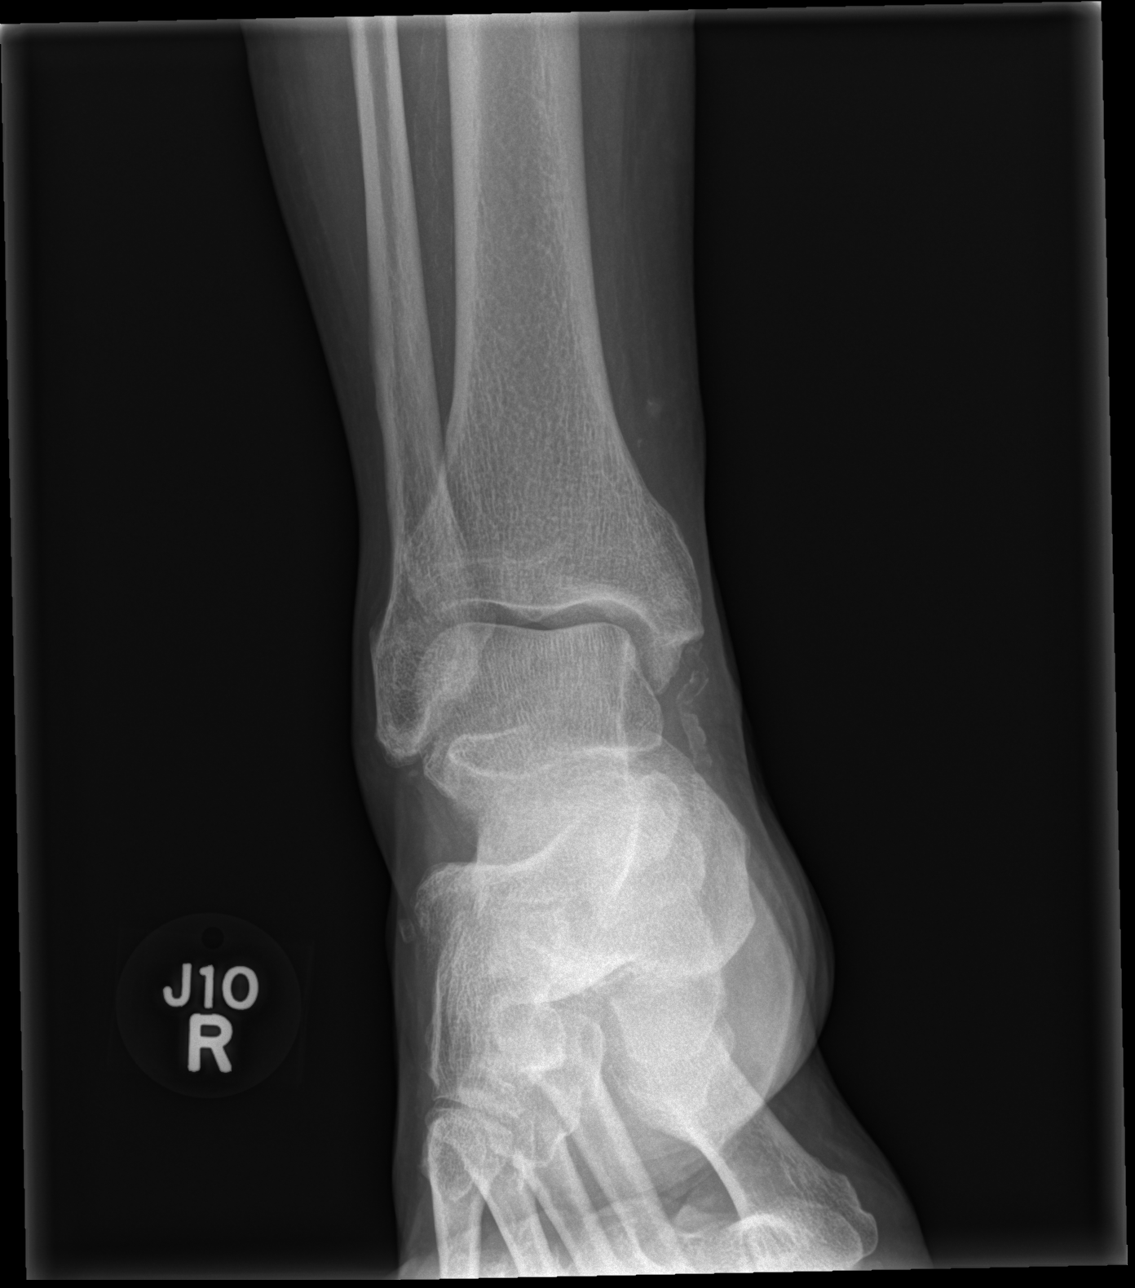

[x ankle obl right]
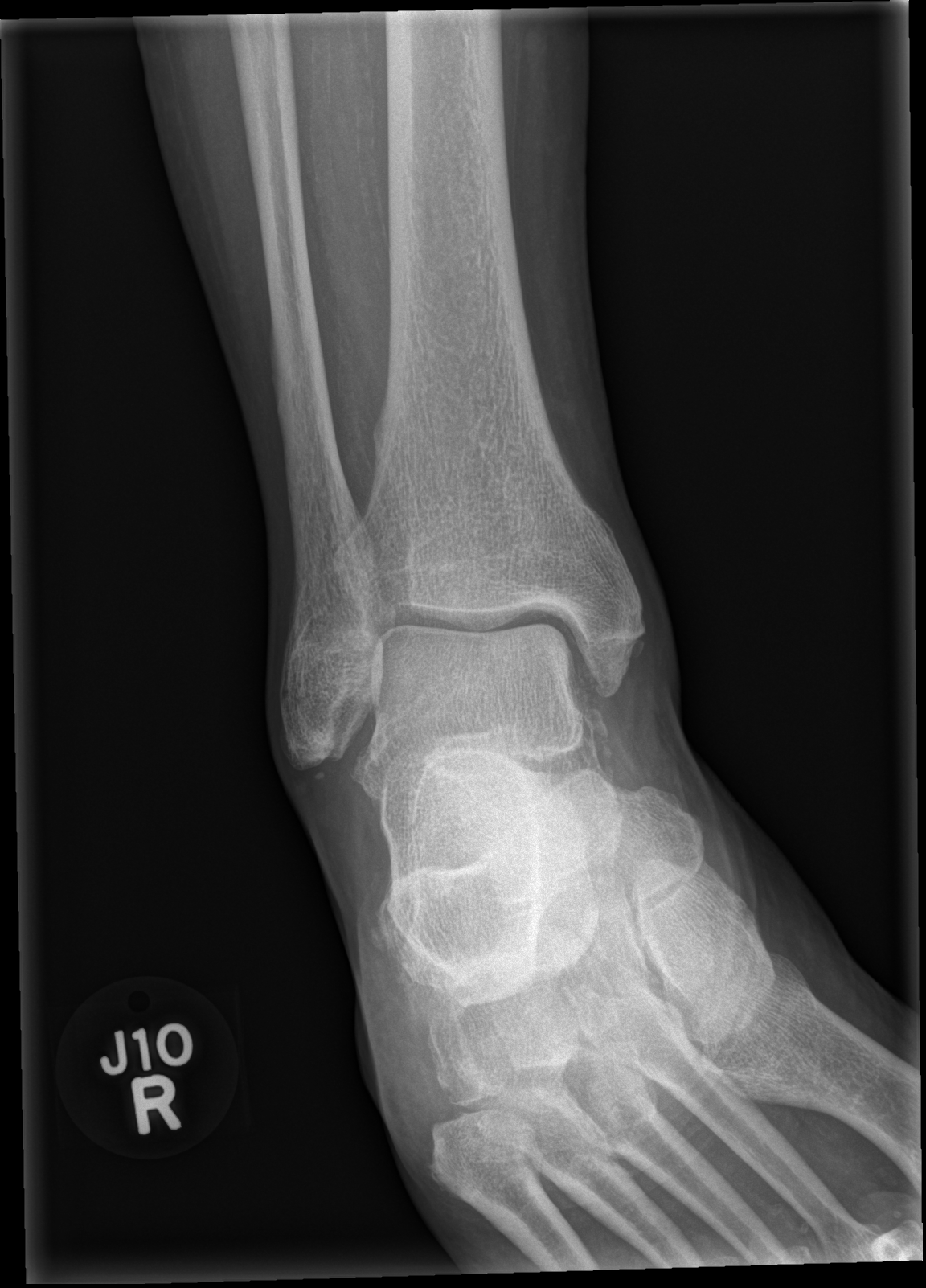

[x ankle lat right]
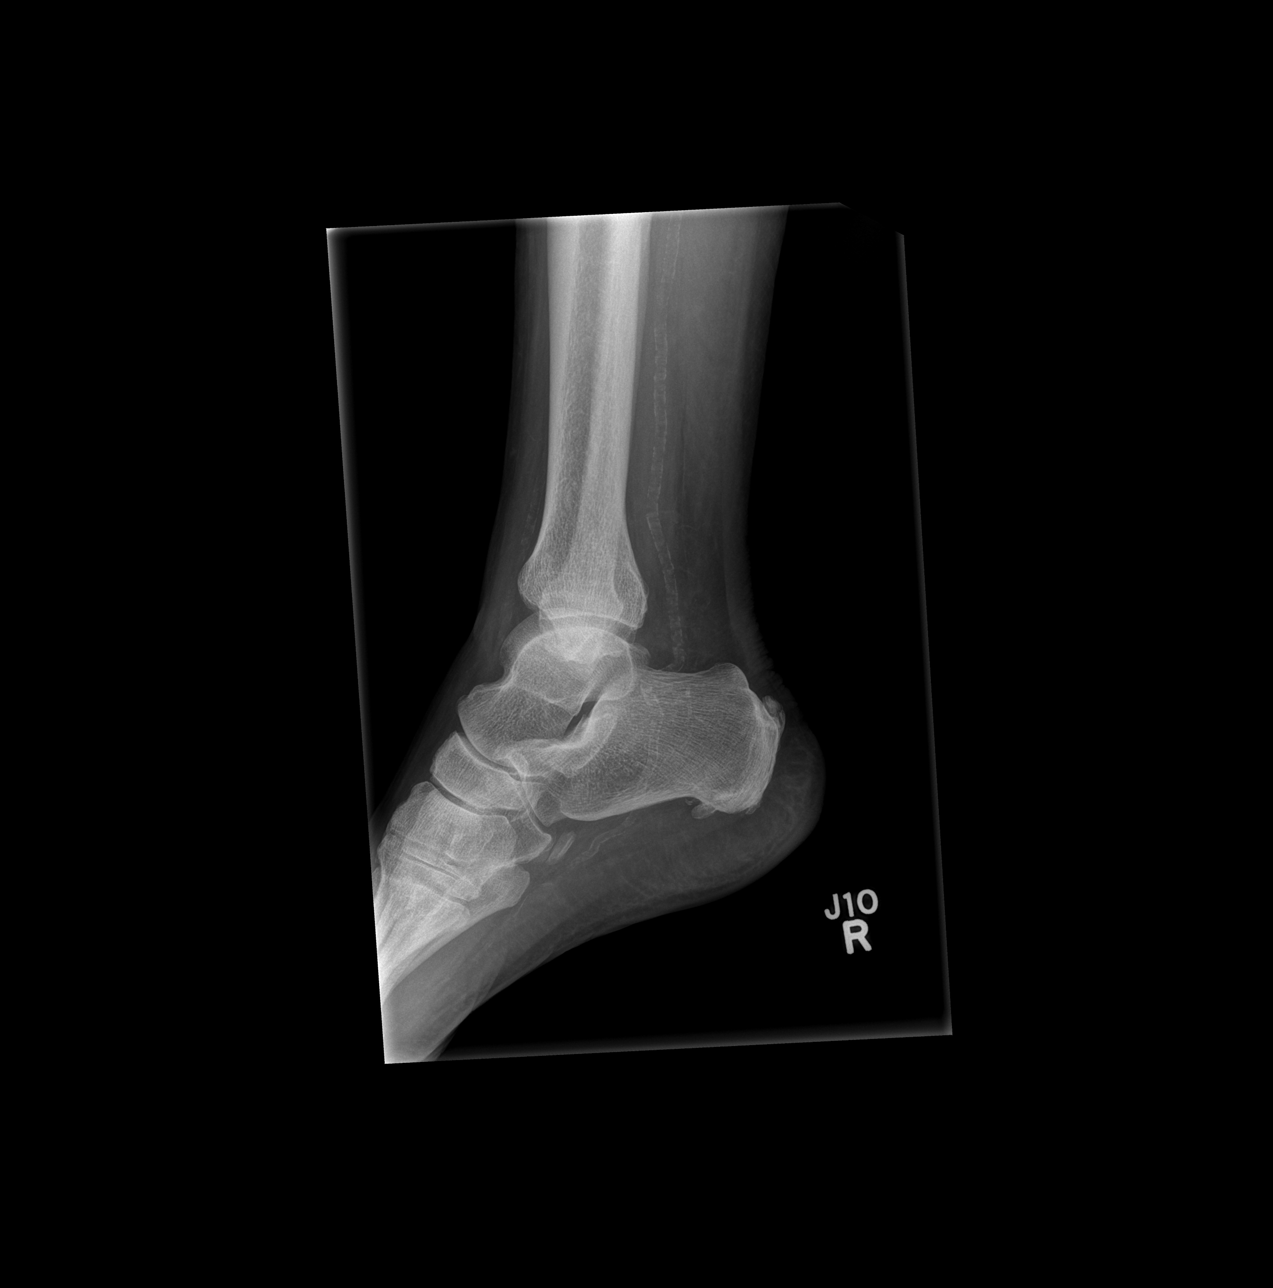

[3 of 3 positions shown; findings below may reference images not displayed]

FINDINGS: Negative for acute fracture.  Mild irregularity distal
fibula and medial malleolus due to old injury.  The ankle mortise
is intact and there is no effusion.  There is arterial
calcification and spurring of the calcaneus.
IMPRESSION: No acute abnormality.

## 2013-11-27 NOTE — Telephone Encounter (Signed)
Pt has been seen since message was taking.

## 2013-12-01 DIAGNOSIS — Z951 Presence of aortocoronary bypass graft: Secondary | ICD-10-CM | POA: Diagnosis not present

## 2013-12-01 DIAGNOSIS — I4891 Unspecified atrial fibrillation: Secondary | ICD-10-CM | POA: Diagnosis not present

## 2013-12-01 DIAGNOSIS — E7800 Pure hypercholesterolemia, unspecified: Secondary | ICD-10-CM | POA: Diagnosis not present

## 2013-12-01 DIAGNOSIS — I2589 Other forms of chronic ischemic heart disease: Secondary | ICD-10-CM | POA: Diagnosis not present

## 2013-12-01 DIAGNOSIS — I442 Atrioventricular block, complete: Secondary | ICD-10-CM | POA: Diagnosis not present

## 2013-12-01 DIAGNOSIS — Z7901 Long term (current) use of anticoagulants: Secondary | ICD-10-CM | POA: Diagnosis not present

## 2013-12-01 DIAGNOSIS — I6529 Occlusion and stenosis of unspecified carotid artery: Secondary | ICD-10-CM | POA: Diagnosis not present

## 2013-12-01 DIAGNOSIS — Z8546 Personal history of malignant neoplasm of prostate: Secondary | ICD-10-CM | POA: Diagnosis not present

## 2013-12-01 DIAGNOSIS — Z8673 Personal history of transient ischemic attack (TIA), and cerebral infarction without residual deficits: Secondary | ICD-10-CM | POA: Diagnosis not present

## 2013-12-01 DIAGNOSIS — I10 Essential (primary) hypertension: Secondary | ICD-10-CM | POA: Diagnosis not present

## 2013-12-01 DIAGNOSIS — Z9861 Coronary angioplasty status: Secondary | ICD-10-CM | POA: Diagnosis not present

## 2013-12-01 DIAGNOSIS — Z87891 Personal history of nicotine dependence: Secondary | ICD-10-CM | POA: Diagnosis not present

## 2013-12-01 DIAGNOSIS — K219 Gastro-esophageal reflux disease without esophagitis: Secondary | ICD-10-CM | POA: Diagnosis not present

## 2013-12-01 DIAGNOSIS — J449 Chronic obstructive pulmonary disease, unspecified: Secondary | ICD-10-CM | POA: Diagnosis not present

## 2013-12-01 DIAGNOSIS — I5022 Chronic systolic (congestive) heart failure: Secondary | ICD-10-CM | POA: Diagnosis not present

## 2013-12-01 DIAGNOSIS — Z85118 Personal history of other malignant neoplasm of bronchus and lung: Secondary | ICD-10-CM | POA: Diagnosis not present

## 2013-12-01 DIAGNOSIS — E119 Type 2 diabetes mellitus without complications: Secondary | ICD-10-CM | POA: Diagnosis not present

## 2013-12-01 DIAGNOSIS — Z79899 Other long term (current) drug therapy: Secondary | ICD-10-CM | POA: Diagnosis not present

## 2013-12-01 DIAGNOSIS — I2510 Atherosclerotic heart disease of native coronary artery without angina pectoris: Secondary | ICD-10-CM | POA: Diagnosis not present

## 2013-12-01 DIAGNOSIS — Z45018 Encounter for adjustment and management of other part of cardiac pacemaker: Secondary | ICD-10-CM | POA: Diagnosis not present

## 2013-12-01 DIAGNOSIS — Z4502 Encounter for adjustment and management of automatic implantable cardiac defibrillator: Secondary | ICD-10-CM | POA: Diagnosis present

## 2013-12-01 MED ORDER — SODIUM CHLORIDE 0.9 % IR SOLN
80.0000 mg | Status: AC
  Filled 2013-12-01: qty 2

## 2013-12-01 MED ORDER — CEFAZOLIN SODIUM-DEXTROSE 2-3 GM-% IV SOLR: 2.0000 g | INTRAVENOUS | Status: AC

## 2013-12-02 ENCOUNTER — Encounter (HOSPITAL_COMMUNITY)
Admission: RE | Disposition: A | Discharge status: Home / Self Care | Payer: Self-pay | Source: Ambulatory Visit | Attending: Internal Medicine

## 2013-12-02 ENCOUNTER — Ambulatory Visit (HOSPITAL_COMMUNITY)
Admission: RE | Admit: 2013-12-02 | Discharge: 2013-12-02 | Disposition: A | Discharge status: Home / Self Care | Payer: PRIVATE HEALTH INSURANCE | Source: Ambulatory Visit | Attending: Internal Medicine | Admitting: Internal Medicine

## 2013-12-02 DIAGNOSIS — I10 Essential (primary) hypertension: Secondary | ICD-10-CM | POA: Insufficient documentation

## 2013-12-02 DIAGNOSIS — I4891 Unspecified atrial fibrillation: Secondary | ICD-10-CM | POA: Insufficient documentation

## 2013-12-02 DIAGNOSIS — E119 Type 2 diabetes mellitus without complications: Secondary | ICD-10-CM | POA: Insufficient documentation

## 2013-12-02 DIAGNOSIS — J449 Chronic obstructive pulmonary disease, unspecified: Secondary | ICD-10-CM | POA: Insufficient documentation

## 2013-12-02 DIAGNOSIS — I442 Atrioventricular block, complete: Secondary | ICD-10-CM

## 2013-12-02 DIAGNOSIS — Z85118 Personal history of other malignant neoplasm of bronchus and lung: Secondary | ICD-10-CM | POA: Insufficient documentation

## 2013-12-02 DIAGNOSIS — E7800 Pure hypercholesterolemia, unspecified: Secondary | ICD-10-CM | POA: Insufficient documentation

## 2013-12-02 DIAGNOSIS — Z45018 Encounter for adjustment and management of other part of cardiac pacemaker: Secondary | ICD-10-CM | POA: Insufficient documentation

## 2013-12-02 DIAGNOSIS — Z951 Presence of aortocoronary bypass graft: Secondary | ICD-10-CM | POA: Insufficient documentation

## 2013-12-02 DIAGNOSIS — Z95810 Presence of automatic (implantable) cardiac defibrillator: Secondary | ICD-10-CM

## 2013-12-02 DIAGNOSIS — K219 Gastro-esophageal reflux disease without esophagitis: Secondary | ICD-10-CM | POA: Insufficient documentation

## 2013-12-02 DIAGNOSIS — Z7901 Long term (current) use of anticoagulants: Secondary | ICD-10-CM | POA: Insufficient documentation

## 2013-12-02 DIAGNOSIS — I5022 Chronic systolic (congestive) heart failure: Secondary | ICD-10-CM

## 2013-12-02 DIAGNOSIS — I482 Chronic atrial fibrillation, unspecified: Secondary | ICD-10-CM

## 2013-12-02 DIAGNOSIS — I2589 Other forms of chronic ischemic heart disease: Secondary | ICD-10-CM | POA: Insufficient documentation

## 2013-12-02 DIAGNOSIS — I2510 Atherosclerotic heart disease of native coronary artery without angina pectoris: Secondary | ICD-10-CM | POA: Insufficient documentation

## 2013-12-02 DIAGNOSIS — Z4502 Encounter for adjustment and management of automatic implantable cardiac defibrillator: Secondary | ICD-10-CM

## 2013-12-02 DIAGNOSIS — Z79899 Other long term (current) drug therapy: Secondary | ICD-10-CM | POA: Insufficient documentation

## 2013-12-02 DIAGNOSIS — Z87891 Personal history of nicotine dependence: Secondary | ICD-10-CM | POA: Insufficient documentation

## 2013-12-02 DIAGNOSIS — Z9861 Coronary angioplasty status: Secondary | ICD-10-CM | POA: Insufficient documentation

## 2013-12-02 DIAGNOSIS — Z8673 Personal history of transient ischemic attack (TIA), and cerebral infarction without residual deficits: Secondary | ICD-10-CM | POA: Insufficient documentation

## 2013-12-02 DIAGNOSIS — Z8546 Personal history of malignant neoplasm of prostate: Secondary | ICD-10-CM | POA: Insufficient documentation

## 2013-12-02 DIAGNOSIS — I6529 Occlusion and stenosis of unspecified carotid artery: Secondary | ICD-10-CM | POA: Insufficient documentation

## 2013-12-02 DIAGNOSIS — J4489 Other specified chronic obstructive pulmonary disease: Secondary | ICD-10-CM | POA: Insufficient documentation

## 2013-12-02 HISTORY — PX: BI-VENTRICULAR PACEMAKER INSERTION (CRT-P): SHX5750

## 2013-12-02 HISTORY — PX: BIV PACEMAKER GENERATOR CHANGE OUT: SHX5746

## 2013-12-02 LAB — SURGICAL PCR SCREEN
MRSA, PCR: NEGATIVE
STAPHYLOCOCCUS AUREUS: NEGATIVE

## 2013-12-02 SURGERY — BIV PACEMAKER GENERATOR CHANGE OUT
Anesthesia: LOCAL

## 2013-12-02 MED ORDER — MIDAZOLAM HCL 5 MG/5ML IJ SOLN
INTRAMUSCULAR | Status: AC
  Filled 2013-12-02: qty 5

## 2013-12-02 MED ORDER — LIDOCAINE HCL (PF) 1 % IJ SOLN
INTRAMUSCULAR | Status: AC
  Filled 2013-12-02: qty 60

## 2013-12-02 MED ORDER — FENTANYL CITRATE 0.05 MG/ML IJ SOLN
INTRAMUSCULAR | Status: AC
  Filled 2013-12-02: qty 2

## 2013-12-02 MED ORDER — ONDANSETRON HCL 4 MG/2ML IJ SOLN: 4.0000 mg | Freq: Four times a day (QID) | INTRAMUSCULAR | Status: DC | PRN

## 2013-12-02 MED ORDER — ACETAMINOPHEN 325 MG PO TABS: 325.0000 mg | ORAL_TABLET | ORAL | Status: DC | PRN

## 2013-12-02 MED ORDER — CHLORHEXIDINE GLUCONATE 4 % EX LIQD
60.0000 mL | Freq: Once | CUTANEOUS | Status: DC
  Filled 2013-12-02: qty 60

## 2013-12-02 MED ORDER — MUPIROCIN 2 % EX OINT
1.0000 "application " | TOPICAL_OINTMENT | Freq: Once | CUTANEOUS | Status: AC
  Administered 2013-12-02: 1 via TOPICAL
  Filled 2013-12-02: qty 22

## 2013-12-02 MED ORDER — SODIUM CHLORIDE 0.9 % IV SOLN
INTRAVENOUS | Status: DC
  Administered 2013-12-02: 50 mL/h via INTRAVENOUS

## 2013-12-02 MED ORDER — MUPIROCIN 2 % EX OINT
TOPICAL_OINTMENT | CUTANEOUS | Status: DC
Start: 2013-12-02 — End: 2013-12-02
  Filled 2013-12-02: qty 22

## 2013-12-02 NOTE — H&P (View-Only) (Signed)
HPI Mr. Churchwell returns today for followup. He is a 80 year old man with an ischemic cardiomyopathy, chronic systolic heart failure, symptomatic bradycardia, status post biventricular ICD implantation. He also has chronic atrial fibrillation. In the interim, the patient has been stable. He denies any recent ICD shock, chest pain, syncope, or peripheral edema. He has reached ERI on his device for which he has never had an appropriate ICD shock. Allergies  Allergen Reactions  . Aspirin Hives     Current Outpatient Prescriptions  Medication Sig Dispense Refill  . acetaminophen (TYLENOL) 325 MG tablet Take 325 mg by mouth every 6 (six) hours as needed (pain).       [name] albuterol (PROVENTIL HFA;VENTOLIN HFA) 108 (90 BASE) MCG/ACT inhaler Inhale 2 puffs into the lungs every 4 (four) hours as needed for wheezing or shortness of breath.      [name] albuterol (PROVENTIL) (5 MG/ML) 0.5% nebulizer solution Take 0.5 mLs (2.5 mg total) by nebulization every 6 (six) hours as needed for wheezing or shortness of breath.  20 mL  5  . allopurinol (ZYLOPRIM) 300 MG tablet Take 300 mg by mouth daily.        [name] atorvastatin (LIPITOR) 20 MG tablet Take 20 mg by mouth daily.        [name] CALCIUM-VITAMIN D PO Take 1 tablet by mouth daily.       . Cyanocobalamin (VITAMIN B-12 PO) Take 1 tablet by mouth 2 (two) times daily.       . digoxin (LANOXIN) 0.125 MG tablet Take 125 mcg by mouth daily.        . furosemide (LASIX) 20 MG tablet Take 1 tablet (20 mg total) by mouth daily.  30 tablet  5  . isosorbide mononitrate (IMDUR) 30 MG 24 hr tablet Take 30 mg by mouth daily.      [name] lisinopril (PRINIVIL,ZESTRIL) 10 MG tablet Take 10 mg by mouth daily.      [name] loratadine (CLARITIN) 10 MG tablet Take 1 tablet (10 mg total) by mouth daily.  30 tablet  6  . meclizine (ANTIVERT) 12.5 MG tablet Take 1 tablet (12.5 mg total) by mouth 2 (two) times daily as needed for dizziness.  30 tablet  0  . metoprolol succinate (TOPROL-XL) 25 MG 24 hr tablet  Take 25 mg by mouth daily.      . midodrine (PROAMATINE) 5 MG tablet Take 1 tablet (5 mg total) by mouth 3 (three) times daily with meals.  90 tablet  0  . NITROSTAT 0.4 MG SL tablet Place 0.4 mg under the tongue every 5 (five) minutes as needed for chest pain.       . Rivaroxaban (XARELTO) 15 MG TABS tablet Take 1 tablet (15 mg total) by mouth daily with supper.  30 tablet  0   No current facility-administered medications for this visit.     Past Medical History  Diagnosis Date  . Diabetes mellitus   . Hypertension   . Chronic systolic CHF (congestive heart failure)     a. 12/2012 Echo: EF 20-25%, mid-dist antsept AK, mod dil LA.  [name] CAD (coronary artery disease)     a. 07/2002 CABG x 3: LIMA->LAD, VG->Diag, VG->OM;  b. 06/2006 Cath: LM 50-60ost/p, LAD patent mid stent, D1 sev dzs, D2 patent stent, LCX nl, OM2 sev sten prox, RCA large/nl, VG->Diag nl, VG->OM nl, LIMA->LAD atretic, EF 30%.  . Ischemic cardiomyopathy     a. 06/2006 s/p SJM Promote RF, model 3207-36 BiV ICD, ser #:  [id number].  [name] Hypercholesterolemia   . Black lung disease   . Nephrolithiasis 09/2000  . CVA (cerebral vascular accident)   . Prostate cancer 11/01/10    gleason 7, 8, 9, gold seeds 02/08/11  . Lung cancer   . COPD (chronic obstructive pulmonary disease)     a. On home O2.  [name] GERD (gastroesophageal reflux disease)   . Arthritis   . Gout   . Pacemaker   . Carotid artery occlusion     ROS:   All systems reviewed and negative except as noted in the HPI.   Past Surgical History  Procedure Laterality Date  . Colonoscopy    . Esophagogastroduodenoscopy  02/19/2011    Procedure: ESOPHAGOGASTRODUODENOSCOPY (EGD);  Surgeon: Jeryl Columbia, MD;  Location: [facility] ENDOSCOPY;  Service: Endoscopy;  Laterality: N/A;  . Upper endoscpopy    . Coronary angioplasty with stent placement  07/1997; 08/1997;03/1998  . Coronary artery bypass graft  07/2002    CABG X3  . Incision and drainage of wound  08/2002    right thigh; S/P EVH   . Insert / replace / remove pacemaker  1979; 1992; 01/2000;  . Insert / replace / remove pacemaker  09/2003; 06/2006    w/AICD  . Insert / replace / remove pacemaker  12/2004    pacmaker explant  . Shoulder arthroscopy w/ rotator cuff repair  05/2008    left     Family History  Problem Relation Age of Onset  . Alzheimer's disease Father 4  . Cancer Father 58    metastatic prostate cancer  . Diabetes Sister 63  . Diabetes Brother   . Diabetes Brother   . Diabetes Brother   . Hypotension Neg Hx   . Malignant hyperthermia Neg Hx   . Pseudochol deficiency Neg Hx      History   Social History  . Marital Status: Widowed    Spouse Name: N/A    Number of Children: N/A  . Years of Education: N/A   Occupational History  . retired     Equities trader   Social History Main Topics  . Smoking status: Former Smoker    Types: Cigarettes    Quit date: 03/12/1949  . Smokeless tobacco: Current User    Types: Chew  . Alcohol Use: No     Comment: drank until per pt 03/19/11  . Drug Use: No  . Sexual Activity: Yes   Other Topics Concern  . Not on file   Social History Narrative  . No narrative on file     BP 100/52  Pulse 63  Ht 5\' 6"  (1.676 m)  Wt 128 lb (58.06 kg)  BMI 20.67 kg/m2  Physical Exam:  Well appearing elderly man, NAD HEENT: Unremarkable Neck:  7 cm JVD, no thyromegally Lungs:  Clear with no wheezes, rales, or rhonchi. HEART:  IRegular rate rhythm, 2/6 systolic murmur, no rubs, no clicks Abd:  soft, positive bowel sounds, no organomegally, no rebound, no guarding Ext:  2 plus pulses, no edema, no cyanosis, no clubbing Skin:  No rashes no nodules Neuro:  CN II through XII intact, motor grossly intact  DEVICE  Normal device function.  See PaceArt for details. ICD at [facility].   Assess/Plan:

## 2013-12-02 NOTE — Discharge Instructions (Signed)
Pacemaker Battery Change, Care After °Refer to this sheet in the next few weeks. These instructions provide you with information on caring for yourself after your procedure. Your health care provider may also give you more specific instructions. Your treatment has been planned according to current medical practices, but problems sometimes occur. Call your health care provider if you have any problems or questions after your procedure. °WHAT TO EXPECT AFTER THE PROCEDURE °After your procedure, it is typical to have the following sensations: °· Soreness at the pacemaker site. °HOME CARE INSTRUCTIONS  °· Keep the incision clean and dry. °· Unless advised otherwise, you may shower beginning 48 hours after your procedure. °· For the first week after the replacement, avoid stretching motions that pull at the incision site, and avoid heavy exercise with the arm that is on the same side as the incision. °· Take medicines only as directed by your health care provider. °· Keep all follow-up visits as directed by your health care provider. °SEEK MEDICAL CARE IF:  °· You have pain at the incision site that is not relieved by over-the-counter or prescription medicine. °· There is drainage or pus from the incision site. °· There is swelling larger than a lime at the incision site. °· You develop red streaking that extends above or below the incision site. °· You feel brief, intermittent palpitations, light-headedness, or any symptoms that you feel might be related to your heart. °SEEK IMMEDIATE MEDICAL CARE IF:  °· You experience chest pain that is different than the pain at the pacemaker site. °· You experience shortness of breath. °· You have palpitations or irregular heartbeat. °· You have light-headedness that does not go away quickly. °· You faint. °· You have pain that gets worse and is not relieved by medicine. °Document Released: 12/17/2012 Document Revised: 07/13/2013 Document Reviewed: 12/17/2012 °Exit[employer]® Patient  Information ©2015 [employer]. This information is not intended to replace advice given to you by your health care provider. Make sure you discuss any questions you have with your health care provider. ° °

## 2013-12-02 NOTE — Interval H&P Note (Signed)
History and Physical Interval Note:  12/02/2013 12:46 PM  [name]  has presented today for surgery, with the diagnosis of eri  The various methods of treatment have been discussed with the patient and family. After consideration of risks, benefits and other options for treatment, the patient has consented to  Procedure(s): BIV PACEMAKER GENERATOR CHANGE OUT (N/A) as a surgical intervention .  The patient's history has been reviewed, patient examined, no change in status, stable for surgery.  I have reviewed the patient's chart and labs.  Questions were answered to the patient's satisfaction.     Mikle Bosworth.D.

## 2013-12-02 NOTE — CV Procedure (Signed)
Electrophysiology procedure note:  Procedure: Removal of previously implanted biventricular ICD which had reached elective replacement, and insertion of a biventricular pacemaker  Preprocedure diagnosis: Chronic systolic heart failure, class III, ejection fraction 25%, complete heart block, chronic atrial fibrillation, status post biventricular ICD, with the current device at elective replacement  Postoperative diagnosis: Same as preoperative diagnoses  Description of the procedure: After informed consent was obtained, the patient was taken to the diagnostic electrophysiology laboratory in the fasting state. After the usual preparation and draping, subcutaneous lidocaine was infiltrated over the over the ICD insertion site. A 5 cm incision was carried out. Electrocautery was utilized to dissect down to the ICD pocket. The generator was removed with gentle traction. The leads were freed up from the fibrous adhesions with electrocautery. The old biventricular ICD was disconnected from the right ventricular and left ventricular pacing leads, and the new St. Jude biventricular pacemaker, serial number [id number] was connected to the old pacing leads. The shocking coils were capped. The pocket was irrigated with antibiotic urination. The incision was closed with 2 layers of Vicryl suture. Benzoin and Steri-Strips were painted on the skin. The patient was returned to his room in satisfactory condition.  Complications: No immediate procedure complications  Conclusion: Successful removal of previously implanted biventricular ICD which increased elective replacement, and insertion of a new biventricular pacemaker.  Cristopher Peru, M.D.

## 2013-12-12 ENCOUNTER — Encounter (HOSPITAL_COMMUNITY): Payer: Self-pay | Admitting: *Deleted

## 2013-12-18 ENCOUNTER — Encounter (HOSPITAL_COMMUNITY): Payer: Self-pay | Admitting: Emergency Medicine

## 2013-12-18 DIAGNOSIS — Z79899 Other long term (current) drug therapy: Secondary | ICD-10-CM | POA: Insufficient documentation

## 2013-12-18 DIAGNOSIS — Z8673 Personal history of transient ischemic attack (TIA), and cerebral infarction without residual deficits: Secondary | ICD-10-CM | POA: Insufficient documentation

## 2013-12-18 DIAGNOSIS — I5022 Chronic systolic (congestive) heart failure: Secondary | ICD-10-CM | POA: Insufficient documentation

## 2013-12-18 DIAGNOSIS — Z8546 Personal history of malignant neoplasm of prostate: Secondary | ICD-10-CM | POA: Insufficient documentation

## 2013-12-18 DIAGNOSIS — Z9861 Coronary angioplasty status: Secondary | ICD-10-CM | POA: Insufficient documentation

## 2013-12-18 DIAGNOSIS — Z87442 Personal history of urinary calculi: Secondary | ICD-10-CM | POA: Insufficient documentation

## 2013-12-18 DIAGNOSIS — Z950 Presence of cardiac pacemaker: Secondary | ICD-10-CM | POA: Diagnosis not present

## 2013-12-18 DIAGNOSIS — Z9981 Dependence on supplemental oxygen: Secondary | ICD-10-CM | POA: Diagnosis not present

## 2013-12-18 DIAGNOSIS — Z87891 Personal history of nicotine dependence: Secondary | ICD-10-CM | POA: Insufficient documentation

## 2013-12-18 DIAGNOSIS — I2510 Atherosclerotic heart disease of native coronary artery without angina pectoris: Secondary | ICD-10-CM | POA: Insufficient documentation

## 2013-12-18 DIAGNOSIS — M109 Gout, unspecified: Secondary | ICD-10-CM | POA: Insufficient documentation

## 2013-12-18 DIAGNOSIS — M1990 Unspecified osteoarthritis, unspecified site: Secondary | ICD-10-CM | POA: Insufficient documentation

## 2013-12-18 DIAGNOSIS — Z85118 Personal history of other malignant neoplasm of bronchus and lung: Secondary | ICD-10-CM | POA: Diagnosis not present

## 2013-12-18 DIAGNOSIS — N3090 Cystitis, unspecified without hematuria: Secondary | ICD-10-CM | POA: Insufficient documentation

## 2013-12-18 DIAGNOSIS — J449 Chronic obstructive pulmonary disease, unspecified: Secondary | ICD-10-CM | POA: Diagnosis not present

## 2013-12-18 DIAGNOSIS — Z951 Presence of aortocoronary bypass graft: Secondary | ICD-10-CM | POA: Diagnosis not present

## 2013-12-18 DIAGNOSIS — I10 Essential (primary) hypertension: Secondary | ICD-10-CM | POA: Diagnosis not present

## 2013-12-18 DIAGNOSIS — Z8719 Personal history of other diseases of the digestive system: Secondary | ICD-10-CM | POA: Insufficient documentation

## 2013-12-18 DIAGNOSIS — E119 Type 2 diabetes mellitus without complications: Secondary | ICD-10-CM | POA: Diagnosis not present

## 2013-12-18 DIAGNOSIS — R1013 Epigastric pain: Secondary | ICD-10-CM | POA: Diagnosis present

## 2013-12-18 DIAGNOSIS — E780 Pure hypercholesterolemia: Secondary | ICD-10-CM | POA: Diagnosis not present

## 2013-12-18 NOTE — ED Notes (Signed)
Pt. reports right flank pain and mid abdominal pain with nausea onset this evening , denies fever or chills , no hematuria , denies fall or injury.

## 2013-12-19 ENCOUNTER — Emergency Department (HOSPITAL_COMMUNITY): Payer: PRIVATE HEALTH INSURANCE

## 2013-12-19 ENCOUNTER — Emergency Department (HOSPITAL_COMMUNITY)
Admission: EM | Admit: 2013-12-19 | Discharge: 2013-12-19 | Disposition: A | Discharge status: Home / Self Care | Payer: PRIVATE HEALTH INSURANCE | Attending: Emergency Medicine | Admitting: Emergency Medicine

## 2013-12-19 ENCOUNTER — Encounter (HOSPITAL_COMMUNITY): Payer: Self-pay

## 2013-12-19 DIAGNOSIS — R109 Unspecified abdominal pain: Secondary | ICD-10-CM

## 2013-12-19 DIAGNOSIS — N3090 Cystitis, unspecified without hematuria: Secondary | ICD-10-CM | POA: Diagnosis not present

## 2013-12-19 LAB — COMPREHENSIVE METABOLIC PANEL
ALT: 24 U/L (ref 0–53)
ANION GAP: 13 (ref 5–15)
AST: 22 U/L (ref 0–37)
Albumin: 3.6 g/dL (ref 3.5–5.2)
Alkaline Phosphatase: 125 U/L — ABNORMAL HIGH (ref 39–117)
BILIRUBIN TOTAL: 0.6 mg/dL (ref 0.3–1.2)
BUN: 14 mg/dL (ref 6–23)
CHLORIDE: 92 meq/L — AB (ref 96–112)
CO2: 32 mEq/L (ref 19–32)
Calcium: 10.5 mg/dL (ref 8.4–10.5)
Creatinine, Ser: 1.05 mg/dL (ref 0.50–1.35)
GFR calc non Af Amer: 65 mL/min — ABNORMAL LOW (ref >90)
GFR, EST AFRICAN AMERICAN: 76 mL/min — AB (ref >90)
GLUCOSE: 136 mg/dL — AB (ref 70–99)
POTASSIUM: 4.4 meq/L (ref 3.7–5.3)
SODIUM: 137 meq/L (ref 137–147)
Total Protein: 8.8 g/dL — ABNORMAL HIGH (ref 6.0–8.3)

## 2013-12-19 LAB — URINALYSIS, ROUTINE W REFLEX MICROSCOPIC
Bilirubin Urine: NEGATIVE
Glucose, UA: NEGATIVE mg/dL
KETONES UR: NEGATIVE mg/dL
Leukocytes, UA: NEGATIVE
NITRITE: NEGATIVE
PH: 8.5 — AB (ref 5.0–8.0)
Protein, ur: 30 mg/dL — AB
SPECIFIC GRAVITY, URINE: 1.011 (ref 1.005–1.030)
UROBILINOGEN UA: 0.2 mg/dL (ref 0.0–1.0)

## 2013-12-19 LAB — CBC WITH DIFFERENTIAL/PLATELET
Basophils Absolute: 0 10*3/uL (ref 0.0–0.1)
Basophils Relative: 0 % (ref 0–1)
Eosinophils Absolute: 0.1 10*3/uL (ref 0.0–0.7)
Eosinophils Relative: 1 % (ref 0–5)
HCT: 30.3 % — ABNORMAL LOW (ref 39.0–52.0)
HEMOGLOBIN: 9.8 g/dL — AB (ref 13.0–17.0)
LYMPHS PCT: 17 % (ref 12–46)
Lymphs Abs: 0.7 10*3/uL (ref 0.7–4.0)
MCH: 28.5 pg (ref 26.0–34.0)
MCHC: 32.3 g/dL (ref 30.0–36.0)
MCV: 88.1 fL (ref 78.0–100.0)
MONOS PCT: 8 % (ref 3–12)
Monocytes Absolute: 0.3 10*3/uL (ref 0.1–1.0)
NEUTROS ABS: 3.2 10*3/uL (ref 1.7–7.7)
NEUTROS PCT: 74 % (ref 43–77)
PLATELETS: 169 10*3/uL (ref 150–400)
RBC: 3.44 MIL/uL — ABNORMAL LOW (ref 4.22–5.81)
RDW: 17.1 % — ABNORMAL HIGH (ref 11.5–15.5)
WBC: 4.3 10*3/uL (ref 4.0–10.5)

## 2013-12-19 LAB — URINE MICROSCOPIC-ADD ON

## 2013-12-19 LAB — LIPASE, BLOOD: Lipase: 60 U/L — ABNORMAL HIGH (ref 11–59)

## 2013-12-19 LAB — TROPONIN I: Troponin I: <0.3 ng/mL (ref <0.30)

## 2013-12-19 MED ORDER — GI COCKTAIL ~~LOC~~
30.0000 mL | Freq: Once | ORAL | Status: AC
  Administered 2013-12-19: 30 mL via ORAL
  Filled 2013-12-19: qty 30

## 2013-12-19 MED ORDER — TRAMADOL HCL 50 MG PO TABS: 50.0000 mg | ORAL_TABLET | Freq: Four times a day (QID) | ORAL | Status: DC | PRN

## 2013-12-19 MED ORDER — CEPHALEXIN 250 MG PO CAPS
500.0000 mg | ORAL_CAPSULE | Freq: Once | ORAL | Status: AC
  Administered 2013-12-19: 500 mg via ORAL
  Filled 2013-12-19: qty 2

## 2013-12-19 MED ORDER — FAMOTIDINE 20 MG PO TABS
40.0000 mg | ORAL_TABLET | Freq: Once | ORAL | Status: AC
  Administered 2013-12-19: 40 mg via ORAL
  Filled 2013-12-19: qty 2

## 2013-12-19 MED ORDER — CEPHALEXIN 500 MG PO CAPS: 500.0000 mg | ORAL_CAPSULE | Freq: Four times a day (QID) | ORAL | Status: DC

## 2013-12-19 NOTE — ED Provider Notes (Signed)
CSN: [id number]     Arrival date & time 12/18/13  2336 History   First MD Initiated Contact with Patient 12/19/13 0344     Chief Complaint  Patient presents with  . Flank Pain  . Abdominal Pain     (Consider location/radiation/quality/duration/timing/severity/associated sxs/prior Treatment) HPI Comments: Patient here complaining of mid epigastric abdominal pain which began yesterday after he ate collard greens. Has had dry heaves  without fever or chills. No diarrhea noted. Symptoms are unrelieved with TUMS. Denies any anginal type quality. No diaphoresis or dyspnea. Pain is burning and located epigastric and radiates to his back. Nothing makes his symptoms better or worse. No prior history of same. Nausea has improved  Patient is a 80 y.o. male presenting with flank pain and abdominal pain. The history is provided by the patient and a relative.  Flank Pain Associated symptoms include abdominal pain.  Abdominal Pain   Past Medical History  Diagnosis Date  . Diabetes mellitus   . Hypertension   . Chronic systolic CHF (congestive heart failure)     a. 12/2012 Echo: EF 20-25%, mid-dist antsept AK, mod dil LA.  [name] CAD (coronary artery disease)     a. 07/2002 CABG x 3: LIMA->LAD, VG->Diag, VG->OM;  b. 06/2006 Cath: LM 50-60ost/p, LAD patent mid stent, D1 sev dzs, D2 patent stent, LCX nl, OM2 sev sten prox, RCA large/nl, VG->Diag nl, VG->OM nl, LIMA->LAD atretic, EF 30%.  . Ischemic cardiomyopathy     a. 06/2006 s/p SJM Promote RF, model 3207-36 BiV ICD, ser #: [id number].  [name] Hypercholesterolemia   . Black lung disease   . Nephrolithiasis 09/2000  . CVA (cerebral vascular accident)   . Prostate cancer 11/01/10    gleason 7, 8, 9, gold seeds 02/08/11  . Lung cancer   . COPD (chronic obstructive pulmonary disease)     a. On home O2.  [name] GERD (gastroesophageal reflux disease)   . Arthritis   . Gout   . Carotid artery occlusion    Past Surgical History  Procedure Laterality Date  .  Colonoscopy    . Esophagogastroduodenoscopy  02/19/2011    Procedure: ESOPHAGOGASTRODUODENOSCOPY (EGD);  Surgeon: Jeryl Columbia, MD;  Location: [facility] ENDOSCOPY;  Service: Endoscopy;  Laterality: N/A;  . Upper endoscpopy    . Coronary angioplasty with stent placement  07/1997; 08/1997;03/1998  . Coronary artery bypass graft  07/2002    CABG X3  . Incision and drainage of wound  08/2002    right thigh; S/P EVH  . Insert / replace / remove pacemaker  1979; 1992; 01/2000;  . Insert / replace / remove pacemaker  09/2003; 06/2006    w/AICD  . Insert / replace / remove pacemaker  12/2004    pacmaker explant  . Shoulder arthroscopy w/ rotator cuff repair  05/2008    left  . Bi-ventricular pacemaker insertion (crt-p)  12-02-13    downgrade of previously implanted CRTD to STJ CRTP   Family History  Problem Relation Age of Onset  . Alzheimer's disease Father 14  . Cancer Father 81    metastatic prostate cancer  . Diabetes Sister 57  . Diabetes Brother   . Diabetes Brother   . Diabetes Brother   . Hypotension Neg Hx   . Malignant hyperthermia Neg Hx   . Pseudochol deficiency Neg Hx    History  Substance Use Topics  . Smoking status: Former Smoker    Types: Cigarettes    Quit date: 03/12/1949  . Smokeless tobacco:  Current User    Types: Chew  . Alcohol Use: No     Comment: drank until per pt 03/19/11    Review of Systems  Gastrointestinal: Positive for abdominal pain.  Genitourinary: Positive for flank pain.  All other systems reviewed and are negative.     Allergies  Aspirin  Home Medications   Prior to Admission medications   Medication Sig Start Date End Date Taking? Authorizing Provider  acetaminophen (TYLENOL) 325 MG tablet Take 325 mg by mouth every 6 (six) hours as needed (pain).     Historical Provider, MD  albuterol (PROVENTIL HFA;VENTOLIN HFA) 108 (90 BASE) MCG/ACT inhaler Inhale 2 puffs into the lungs every 4 (four) hours as needed for wheezing or shortness of breath.     Historical Provider, MD  allopurinol (ZYLOPRIM) 300 MG tablet Take 300 mg by mouth daily.      Historical Provider, MD  CALCIUM-VITAMIN D PO Take 1 tablet by mouth 2 (two) times daily.     Historical Provider, MD  digoxin (LANOXIN) 0.125 MG tablet Take 125 mcg by mouth daily.      Historical Provider, MD  furosemide (LASIX) 20 MG tablet Take 20 mg by mouth daily.    Historical Provider, MD  isosorbide mononitrate (IMDUR) 30 MG 24 hr tablet Take 30 mg by mouth daily. 07/27/13   Historical Provider, MD  lisinopril (PRINIVIL,ZESTRIL) 10 MG tablet Take 10 mg by mouth daily.    Historical Provider, MD  metoprolol succinate (TOPROL-XL) 25 MG 24 hr tablet Take 25 mg by mouth daily. 07/27/13   Historical Provider, MD  NITROSTAT 0.4 MG SL tablet Place 0.4 mg under the tongue every 5 (five) minutes as needed for chest pain.  10/17/11   Historical Provider, MD  Omega-3 Fatty Acids (FISH OIL) 1000 MG CAPS Take 1,000 mg by mouth 2 (two) times daily.    Historical Provider, MD  Rivaroxaban (XARELTO) 15 MG TABS tablet Take 15 mg by mouth daily with supper.    Historical Provider, MD  vitamin B-12 (CYANOCOBALAMIN) 1000 MCG tablet Take 1,000 mcg by mouth 2 (two) times daily.    Historical Provider, MD   BP 151/69  Pulse 61  Temp(Src) 97.9 F (36.6 C) (Oral)  Resp 20  Ht 5\' 6"  (1.676 m)  Wt 136 lb (61.689 kg)  BMI 21.96 kg/m2  SpO2 99% Physical Exam  Nursing note and vitals reviewed. Constitutional: He is oriented to person, place, and time. He appears well-developed and well-nourished.  Non-toxic appearance. No distress.  HENT:  Head: Normocephalic and atraumatic.  Eyes: Conjunctivae, EOM and lids are normal. Pupils are equal, round, and reactive to light.  Neck: Normal range of motion. Neck supple. No tracheal deviation present. No mass present.  Cardiovascular: Normal rate, regular rhythm and normal heart sounds.  Exam reveals no gallop.   No murmur heard. Pulmonary/Chest: Effort normal and breath sounds  normal. No stridor. No respiratory distress. He has no decreased breath sounds. He has no wheezes. He has no rhonchi. He has no rales.  Abdominal: Soft. Normal appearance and bowel sounds are normal. He exhibits no distension. There is tenderness in the epigastric area. There is no rebound and no CVA tenderness.    Musculoskeletal: Normal range of motion. He exhibits no edema and no tenderness.  Neurological: He is alert and oriented to person, place, and time. He has normal strength. No cranial nerve deficit or sensory deficit. GCS eye subscore is 4. GCS verbal subscore is 5. GCS motor subscore  is 6.  Skin: Skin is warm and dry. No abrasion and no rash noted.  Psychiatric: He has a normal mood and affect. His speech is normal and behavior is normal.    ED Course  Procedures (including critical care time) Labs Review Labs Reviewed  CBC WITH DIFFERENTIAL - Abnormal; Notable for the following:    RBC 3.44 (*)    Hemoglobin 9.8 (*)    HCT 30.3 (*)    RDW 17.1 (*)    All other components within normal limits  COMPREHENSIVE METABOLIC PANEL - Abnormal; Notable for the following:    Chloride 92 (*)    Glucose, Bld 136 (*)    Total Protein 8.8 (*)    Alkaline Phosphatase 125 (*)    GFR calc non Af Amer 65 (*)    GFR calc Af Amer 76 (*)    All other components within normal limits  URINALYSIS, ROUTINE W REFLEX MICROSCOPIC  LIPASE, BLOOD  TROPONIN I    Imaging Review No results found.   EKG Interpretation None      MDM   Final diagnoses:  None    Pt given gi cocktail and Pepcid and feels better. Patient had hematuria and will had abdominal CT to rule out a kidney stone. Care signed out to oncoming provider    Leota Jacobsen, MD 12/19/13 [phone]

## 2013-12-19 NOTE — Discharge Instructions (Signed)
Take antibiotic as directed. Followup with Dr. Ninfa Meeker in the next few days to make sure that the blood and urine clears. Take the tramadol as needed for any pain. Return for any new or worse symptoms. CT scan showed no evidence of a kidney stone in the ureters. Also ultrasound of the gallbladder showed no abnormalities the air. CT scan raise concerns for possible urinary tract infection in the bladder that's the reason for the antibiotic.

## 2013-12-19 NOTE — ED Provider Notes (Addendum)
The patient turned over to me from overnight physician CT scan pending to rule out kidney stone. CT scan negative. Patient does have some hematuria. Patient is on several to. Urine culture sent CT scan did raise suspicion for cystitis. Will treat with antibiotic. Patient nontoxic no acute distress.  In addition very mild elevation in lipase noted. Patient's had elevated lipase before. CT scan did not raise any significant concerns for pancreatitis. Clinically do not feel the patient has pancreatitis.  Results for orders placed during the hospital encounter of 12/19/13  CBC WITH DIFFERENTIAL      Result Value Ref Range   WBC 4.3  4.0 - 10.5 K/uL   RBC 3.44 (*) 4.22 - 5.81 MIL/uL   Hemoglobin 9.8 (*) 13.0 - 17.0 g/dL   HCT 30.3 (*) 39.0 - 52.0 %   MCV 88.1  78.0 - 100.0 fL   MCH 28.5  26.0 - 34.0 pg   MCHC 32.3  30.0 - 36.0 g/dL   RDW 17.1 (*) 11.5 - 15.5 %   Platelets 169  150 - 400 K/uL   Neutrophils Relative % 74  43 - 77 %   Neutro Abs 3.2  1.7 - 7.7 K/uL   Lymphocytes Relative 17  12 - 46 %   Lymphs Abs 0.7  0.7 - 4.0 K/uL   Monocytes Relative 8  3 - 12 %   Monocytes Absolute 0.3  0.1 - 1.0 K/uL   Eosinophils Relative 1  0 - 5 %   Eosinophils Absolute 0.1  0.0 - 0.7 K/uL   Basophils Relative 0  0 - 1 %   Basophils Absolute 0.0  0.0 - 0.1 K/uL  COMPREHENSIVE METABOLIC PANEL      Result Value Ref Range   Sodium 137  137 - 147 mEq/L   Potassium 4.4  3.7 - 5.3 mEq/L   Chloride 92 (*) 96 - 112 mEq/L   CO2 32  19 - 32 mEq/L   Glucose, Bld 136 (*) 70 - 99 mg/dL   BUN 14  6 - 23 mg/dL   Creatinine, Ser 1.05  0.50 - 1.35 mg/dL   Calcium 10.5  8.4 - 10.5 mg/dL   Total Protein 8.8 (*) 6.0 - 8.3 g/dL   Albumin 3.6  3.5 - 5.2 g/dL   AST 22  0 - 37 U/L   ALT 24  0 - 53 U/L   Alkaline Phosphatase 125 (*) 39 - 117 U/L   Total Bilirubin 0.6  0.3 - 1.2 mg/dL   GFR calc non Af Amer 65 (*) >90 mL/min   GFR calc Af Amer 76 (*) >90 mL/min   Anion gap 13  5 - 15  URINALYSIS, ROUTINE W REFLEX  MICROSCOPIC      Result Value Ref Range   Color, Urine YELLOW  YELLOW   APPearance CLOUDY (*) CLEAR   Specific Gravity, Urine 1.011  1.005 - 1.030   pH 8.5 (*) 5.0 - 8.0   Glucose, UA NEGATIVE  NEGATIVE mg/dL   Hgb urine dipstick LARGE (*) NEGATIVE   Bilirubin Urine NEGATIVE  NEGATIVE   Ketones, ur NEGATIVE  NEGATIVE mg/dL   Protein, ur 30 (*) NEGATIVE mg/dL   Urobilinogen, UA 0.2  0.0 - 1.0 mg/dL   Nitrite NEGATIVE  NEGATIVE   Leukocytes, UA NEGATIVE  NEGATIVE  LIPASE, BLOOD      Result Value Ref Range   Lipase 60 (*) 11 - 59 U/L  TROPONIN I      Result  Value Ref Range   Troponin I <0.30  <0.30 ng/mL  URINE MICROSCOPIC-ADD ON      Result Value Ref Range   WBC, UA 0-2  <3 WBC/hpf   RBC / HPF 21-50  <3 RBC/hpf   Bacteria, UA RARE  RARE   US Abdomen Complete  12/19/2013   CLINICAL DATA:  Acute onset of generalized abdominal pain. Initial encounter.  EXAM: ULTRASOUND ABDOMEN COMPLETE  COMPARISON:  CT of the abdomen and pelvis from 01/10/2012, and renal ultrasound performed 02/16/2011  FINDINGS: Gallbladder: Relatively decompressed and not well assessed. No evidence for obstruction or cholecystitis. No ultrasonographic Murphy's sign elicited.  Common bile duct:  Diameter: 0.4 cm, within normal limits in caliber.  Liver: No focal lesion identified. Within normal limits in parenchymal echogenicity.  IVC: No abnormality visualized.  Pancreas: Visualized portion unremarkable.  Spleen: Size and appearance within normal limits.  Right Kidney:  Length: 9.5 cm. Increased parenchymal echogenicity and mild cortical thinning noted. A 5 mm stone is again noted at the lower pole of the right kidney. No mass or hydronephrosis visualized.  Left Kidney:  Length: 10.0 cm. Mildly increased parenchymal echogenicity noted. No hydronephrosis visualized. A 3.6 cm cyst is noted at the lower pole of the left kidney; this was seen to be slightly complex on the prior ultrasound from 2012, but has decreased slightly in  size.  Abdominal aorta: No aneurysm visualized. Not well characterized distally due to overlying structures.  Other findings: None.  IMPRESSION: 1. No acute abnormality seen within the abdomen. 2. Increased renal parenchymal echogenicity raises concern for medical renal disease. Mild right renal cortical thinning noted. 3. 3.6 cm slightly complex left renal cyst has decreased mildly in size and is likely benign. 4. 5 mm nonobstructing stone again noted at the lower pole of the right kidney.   Electronically Signed   By: Garald Balding M.D.   On: 12/19/2013 06:50   Ct Renal Stone Study  12/19/2013   CLINICAL DATA:  Right flank and mid abdominal pain  EXAM: CT ABDOMEN AND PELVIS WITHOUT CONTRAST  TECHNIQUE: Multidetector CT imaging of the abdomen and pelvis was performed following the standard protocol without oral or intravenous contrast material administration.  COMPARISON:  January 10, 2012  FINDINGS: There is emphysematous change in the lung bases. There is chronic scarring and pleural thickening in the left base with parietal pleural calcification in the left base posteriorly. No new opacity is identified in the lung bases compared to prior study. There is cardiac prominence with pacemaker leads attached to the heart, stable.  No focal liver lesions are identified on this noncontrast enhanced study. Gallbladder wall is not thickened. There is no appreciable biliary duct dilatation.  Spleen, pancreas, and adrenals appear normal.  There is a calculus in the lower pole the right kidney measuring 8 x 8 mm in size. No other intrarenal calculi are identified. There is a mass arising from the lower pole of the left kidney posteriorly and laterally measuring 3.6 x 3.5 cm. This mass has attenuation values slightly higher than is expected with a simple cyst. There is calcification along the peripheral wall of this mass. This mass is unchanged in size and contour compared to the previous study. It has marginally higher  attenuation values compared to the previous study, however. No mass mm appreciated in the right kidney. There is no hydronephrosis on either side. There is no evidence of ureteral calculus on either side.  In the pelvis, there is mild thickening  of the wall of the urinary bladder. There are small apparent clips near the prostate in the lower pelvis. There is no pelvic mass or fluid collection. There are multiple sigmoid diverticula without diverticulitis. Appendix appears normal.  There is slight generalized bowel dilatation. There is no appreciable transition zone to suggest obstruction is appreciable. No free air or portal venous air. Stomach is borderline distended with fluid.  There is no apparent ascites, adenopathy, or abscess in the abdomen or pelvis. There is atherosclerotic change in the aorta and iliac arteries without aneurysm. There is extensive calcification in both proximal renal arteries. There is also extensive calcification in the proximal celiac and superior mesenteric arteries. There is no evidence suggesting bowel ischemia, however. There is stable anterior wedging of the superior aspect of the L4 vertebral body. There is extensive lumbar arthropathy. There are no blastic or lytic bone lesions.  IMPRESSION: Stable lower pole right renal calculus measuring 8 x 8 mm. There is no ureteral calculus on either side. There is no hydronephrosis on either side.  Mass arising the lower pole of the left kidney which appears unchanged in size and contour compared to the previous study but does have slightly higher attenuation compared to the previous study. Suspect complex cyst. Particular attention this area on subsequent evaluations is advised. A followup ultrasound in approximately 6 months to confirm no change in size of this mass may be advisable as well.  Suspect a degree of ileus or possible enteritis. No bowel obstruction. No free air. No bowel pneumatosis to suggest ischemic change.  Sigmoid  diverticulosis without diverticulitis.  Thickening in the urinary bladder wall. Suspect a degree of cystitis.  Appendix appears normal.  Underlying emphysema with scarring in the lung bases. Stable parietal pleural thickening and calcification on the left. Suspect prior asbestos exposure.  Extensive atherosclerotic change in the aorta and major mesenteric arteries. There is significant calcification in both proximal renal arteries.   Electronically Signed   By: Lowella Grip M.D.   On: 12/19/2013 08:02      Fredia Sorrow, MD 12/19/13 [id number]  Fredia Sorrow, MD 12/19/13 [phone]

## 2013-12-20 LAB — URINE CULTURE
COLONY COUNT: NO GROWTH
CULTURE: NO GROWTH

## 2014-01-29 IMAGING — CT CT ANGIO CHEST
2 of 6 series · 18 of 46 positions shown · IV contrast (APPLIED)
Comparison: None.

***ADDENDUM*** CREATED: 06/30/2012 [DATE]

Due to a PACS technical problem at the time of dictation, prior
examinations were not available.  Today's examination is now
compared to CT chest 03/24/2011.  There is subpleural scarring in
the left lower lobe, adjacent to pleural calcification, unchanged
from 03/24/2011.  Scarring is also seen along the fissures on the
right.  Left ventricular calcification is unchanged.
***END ADDENDUM*** SIGNED BY: Sue Ann Agenbag, M.D.
CLINICAL DATA: Shortness of breath and chest pain.
CT ANGIOGRAPHY CHEST
TECHNIQUE: Multidetector CT imaging of the chest using the
standard protocol during bolus administration of intravenous
contrast. Multiplanar reconstructed images including MIPs were
obtained and reviewed to evaluate the vascular anatomy.
Contrast: 100mL OMNIPAQUE IOHEXOL 350 MG/ML SOLN

[Series 6: pulm embolism 1.0 b25f thin · axial · 0.74mm/px · z∈[-138,+117]mm · 15 of 281 slices shown]
[im 13/281  lung]
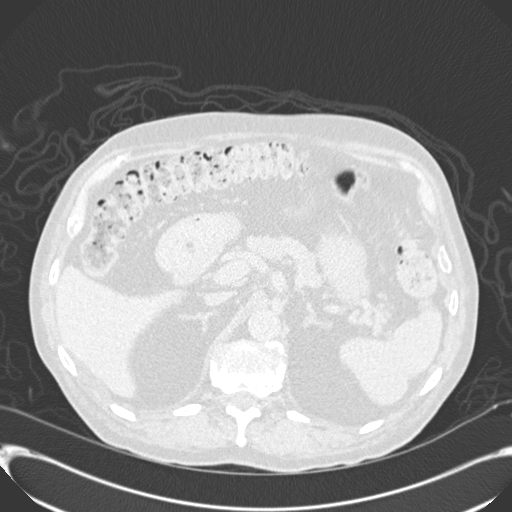
[im 37/281  soft-tissue]
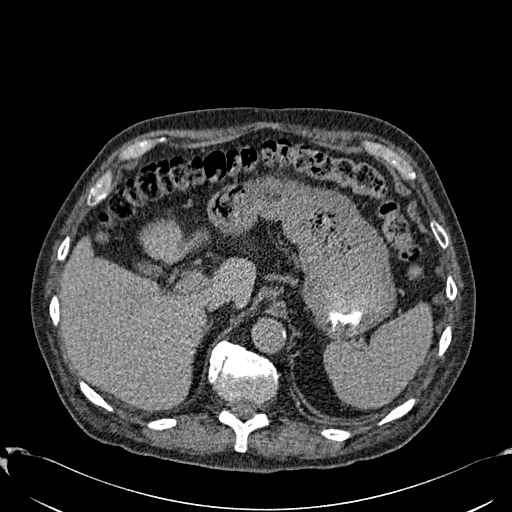
[im 49/281  lung]
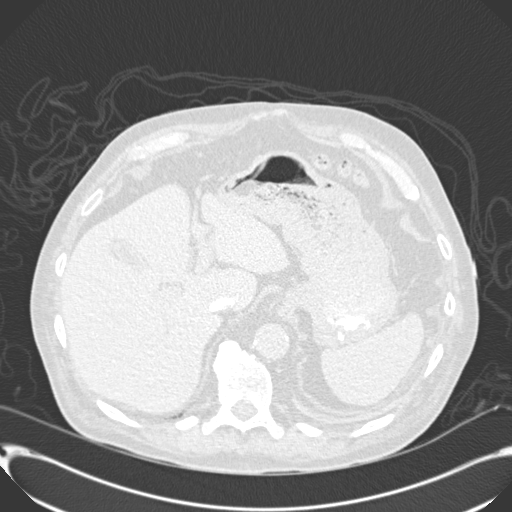
[im 74/281  soft-tissue]
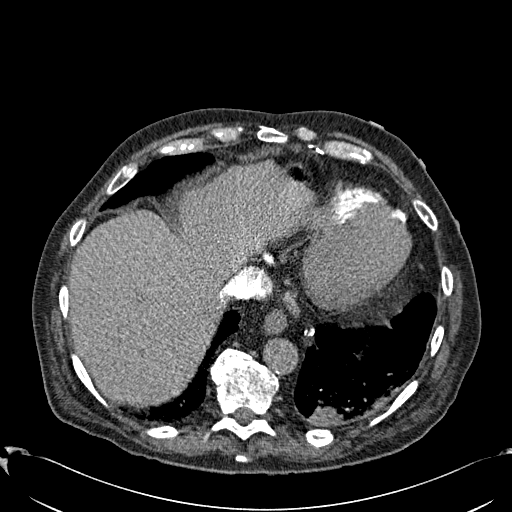
[im 86/281  lung]
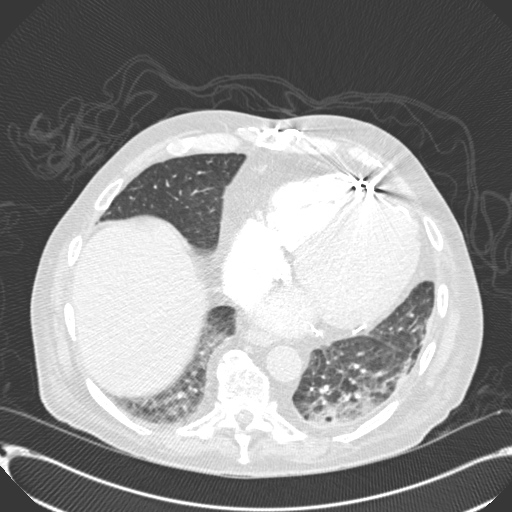
[im 110/281  soft-tissue]
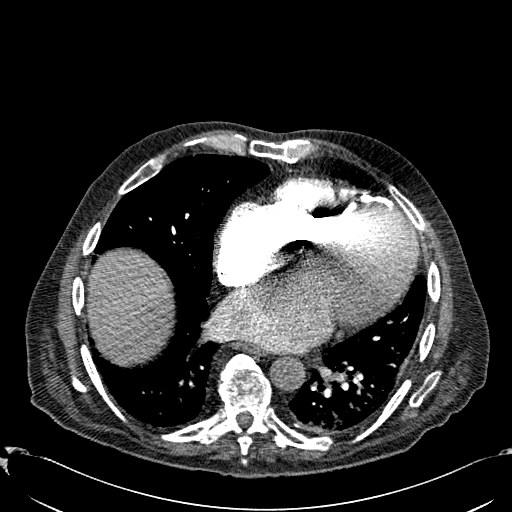
[im 122/281  lung]
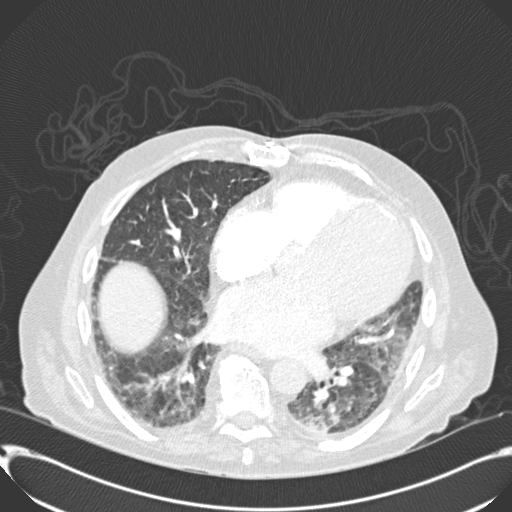
[im 147/281  soft-tissue]
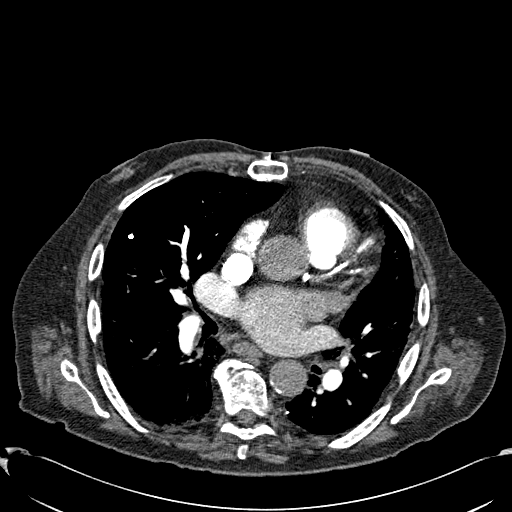
[im 159/281  lung]
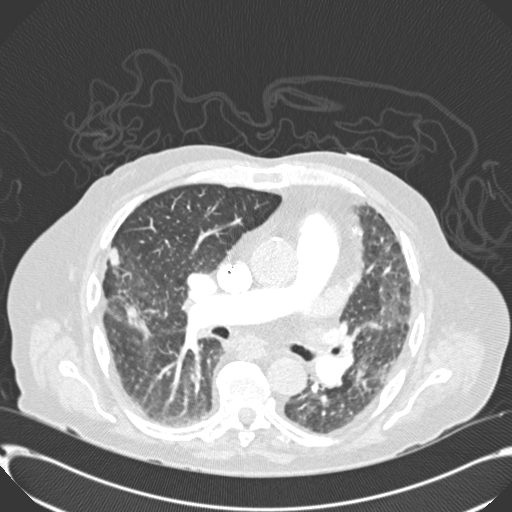
[im 171/281  soft-tissue]
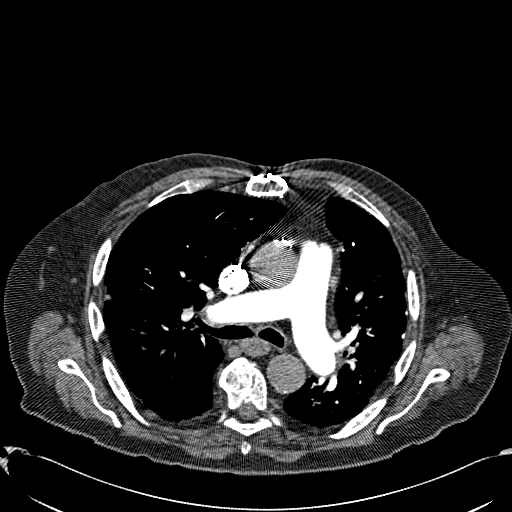
[im 195/281  lung]
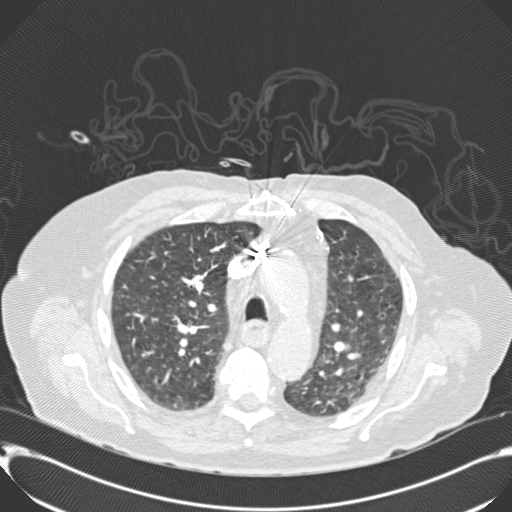
[im 207/281  soft-tissue]
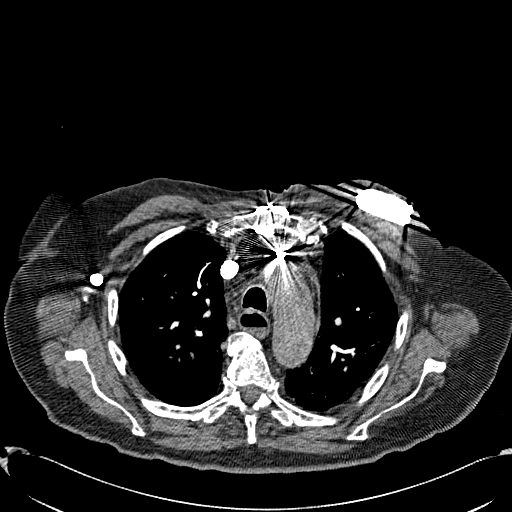
[im 232/281  lung]
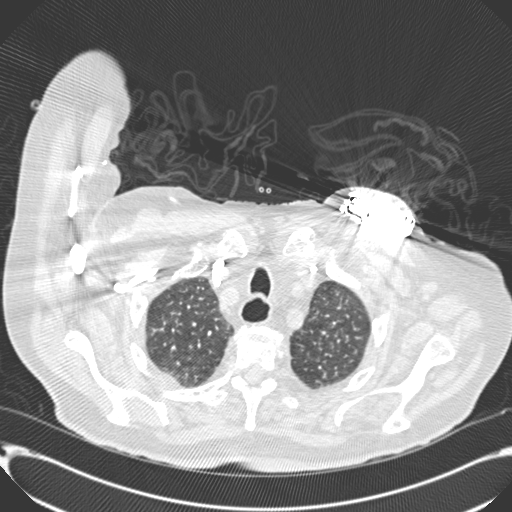
[im 244/281  soft-tissue]
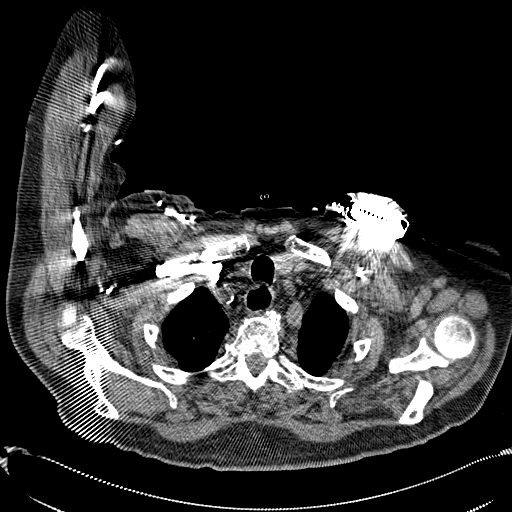
[im 268/281  lung]
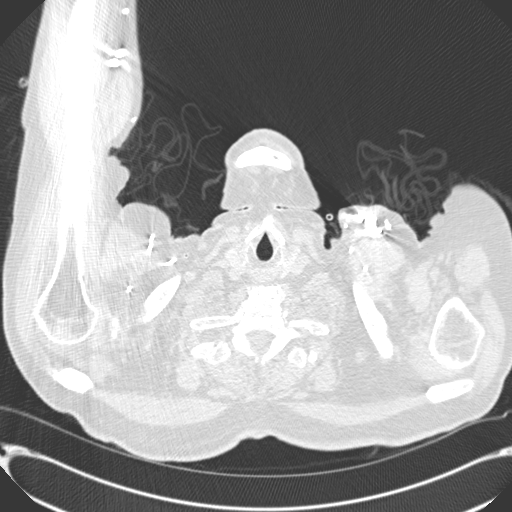

[Series 602: coronal · coronal · 0.74mm/px · 3 of 120 slices shown]
[im 30/120  soft-tissue]
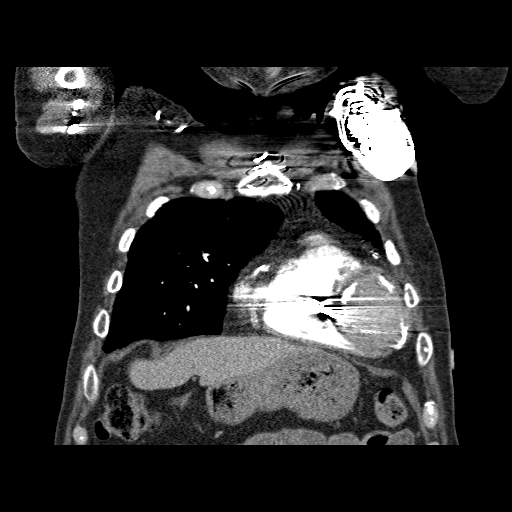
[im 60/120  soft-tissue]
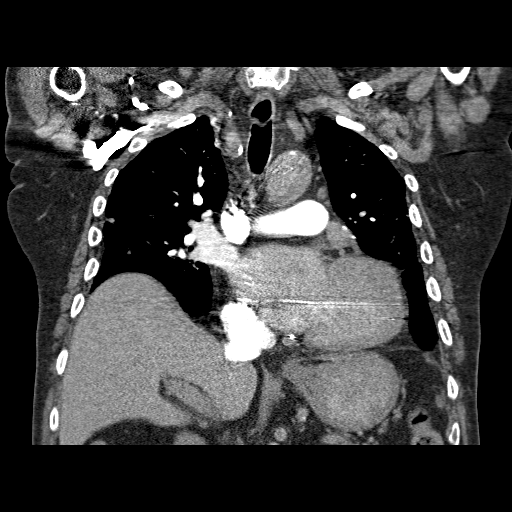
[im 90/120  soft-tissue]
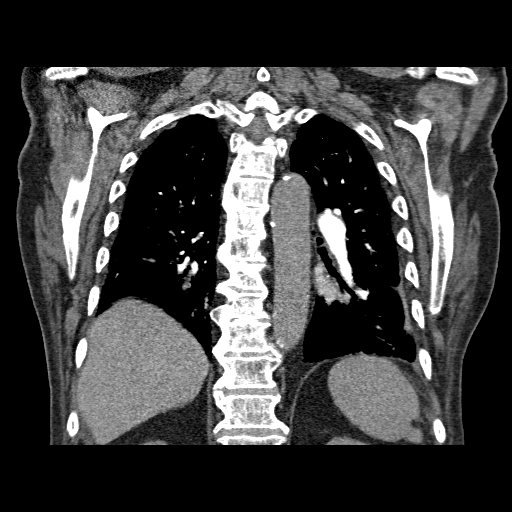

[18 of 46 positions shown; findings below may reference images not displayed]

FINDINGS: Image quality is somewhat degraded by respiratory motion.
No definite pulmonary embolus.  Mediastinal lymph nodes are not
enlarged by CT size criteria.  No hilar or axillary adenopathy.
Heart is enlarged.  No pericardial effusion.  Upper esophagus is
mildly dilated, nonspecific.

Again, respiratory motion degrades image quality.  Probable
scarring along the minor and right major fissure.  Mild scattered
air space disease at the lung bases, greatest in the left lower
lobe.  Subpleural density in the left lower lobe is seen in
association with pleural calcification and may be chronic (example
image 70).

Incidental imaging of the upper abdomen shows no acute findings.
No worrisome lytic or sclerotic lesions.  Degenerative changes are
seen in the spine.  Old posterior left rib fractures.
IMPRESSION: 1.  Image quality is somewhat degraded by respiratory motion.  No
definite pulmonary embolus.
2.  Mild scattered air space disease at the lung bases may be due
to expiratory phase imaging.  Difficult to exclude an infectious or
inflammatory process.
3.  Subpleural consolidation in the left lower lobe is of unknown
chronicity and may be chronic given adjacent pleural calcification.
Cannot exclude an acute infectious/inflammatory etiology.

## 2014-01-29 IMAGING — CR DG CHEST 1V PORT
1 series · 1 of 1 positions shown · non-contrast
Comparison: 04/22/2012

CLINICAL DATA: Chest pain and shortness of breath.

PORTABLE CHEST - 1 VIEW

[AP]
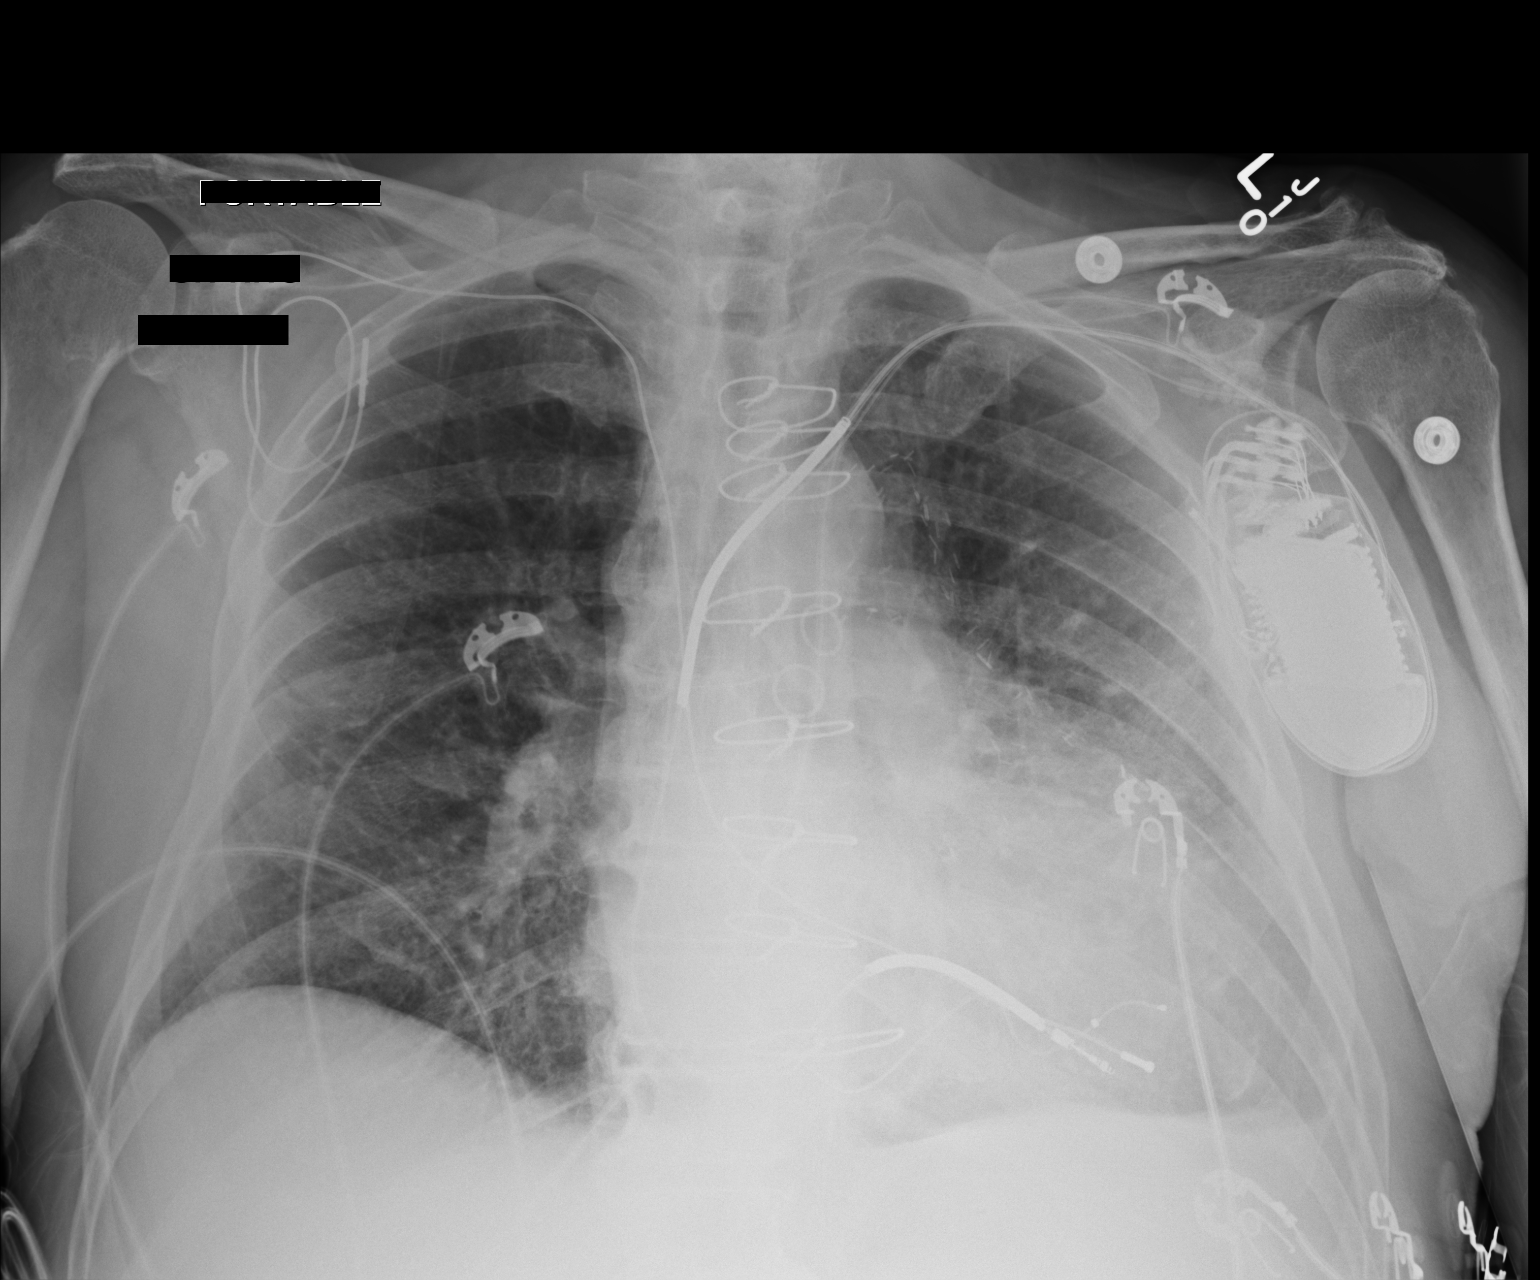

[1 of 1 positions shown; findings below may reference images not displayed]

FINDINGS: There is evidence of diffuse interstitial edema which is
slightly more prominent in the left lung than the right.  A small
left pleural effusion is present.  Heart size is stable and there
is a stable appearance to an ICD/pacing device as well as old pacer
wires.
IMPRESSION: Diffuse interstitial edema consistent with congestive heart
failure.  A small left pleural effusion is present.

## 2014-02-02 ENCOUNTER — Encounter: Payer: Self-pay | Admitting: Internal Medicine

## 2014-02-02 ENCOUNTER — Ambulatory Visit (INDEPENDENT_AMBULATORY_CARE_PROVIDER_SITE_OTHER): Payer: PRIVATE HEALTH INSURANCE | Admitting: Internal Medicine

## 2014-02-02 VITALS — BP 118/62 | HR 61 | Ht 66.0 in | Wt 134.8 lb

## 2014-02-02 DIAGNOSIS — Z950 Presence of cardiac pacemaker: Secondary | ICD-10-CM

## 2014-02-02 DIAGNOSIS — I5022 Chronic systolic (congestive) heart failure: Secondary | ICD-10-CM

## 2014-02-02 DIAGNOSIS — I482 Chronic atrial fibrillation, unspecified: Secondary | ICD-10-CM

## 2014-02-02 LAB — MDC_IDC_ENUM_SESS_TYPE_INCLINIC
Battery Voltage: 3.01 V
Brady Statistic RA Percent Paced: 0 %
Brady Statistic RV Percent Paced: 99 %
Date Time Interrogation Session: 20151124154934
Implantable Pulse Generator Model: 3222
Lead Channel Impedance Value: 537.5 Ohm
Lead Channel Pacing Threshold Amplitude: 0.75 V
Lead Channel Pacing Threshold Pulse Width: 0.5 ms
Lead Channel Sensing Intrinsic Amplitude: 12 mV
Lead Channel Setting Pacing Amplitude: 3 V
MDC IDC MSMT BATTERY REMAINING LONGEVITY: 66 mo
MDC IDC MSMT LEADCHNL LV IMPEDANCE VALUE: 612.5 Ohm
MDC IDC MSMT LEADCHNL LV PACING THRESHOLD AMPLITUDE: 2 V
MDC IDC MSMT LEADCHNL LV PACING THRESHOLD PULSEWIDTH: 0.8 ms
MDC IDC PG SERIAL: 7634311
MDC IDC SET LEADCHNL LV PACING PULSEWIDTH: 0.8 ms
MDC IDC SET LEADCHNL RV PACING AMPLITUDE: 2.5 V
MDC IDC SET LEADCHNL RV PACING PULSEWIDTH: 0.5 ms
MDC IDC SET LEADCHNL RV SENSING SENSITIVITY: 2 mV

## 2014-02-02 NOTE — Assessment & Plan Note (Signed)
His symptoms are class II. He will continue his current medical therapy, and maintain a low-sodium diet.

## 2014-02-02 NOTE — Assessment & Plan Note (Signed)
He is chronically in atrial fibrillation. His ventricular rate is well controlled.

## 2014-02-02 NOTE — Progress Notes (Signed)
HPI [name] returns today for followup. He is a 80 year old man with an ischemic cardiomyopathy, chronic systolic heart failure, symptomatic bradycardia, status post biventricular ICD implantation. He also has chronic atrial fibrillation. In the interim, the patient has been stable. He denies any recent ICD shock, chest pain, syncope, or peripheral edema. He has undergone change out of his old ICD to a biv ppm. He has done well.  Allergies  Allergen Reactions  . Aspirin Hives     Current Outpatient Prescriptions  Medication Sig Dispense Refill  . acetaminophen (TYLENOL) 325 MG tablet Take 325 mg by mouth every 6 (six) hours as needed (pain).     [name] albuterol (PROVENTIL HFA;VENTOLIN HFA) 108 (90 BASE) MCG/ACT inhaler Inhale 2 puffs into the lungs every 4 (four) hours as needed for wheezing or shortness of breath.    [name] albuterol (PROVENTIL) (2.5 MG/3ML) 0.083% nebulizer solution Take 2.5 mg by nebulization every 6 (six) hours as needed for wheezing or shortness of breath.    . allopurinol (ZYLOPRIM) 300 MG tablet Take 300 mg by mouth daily.      [name] CALCIUM-VITAMIN D PO Take 1 tablet by mouth 2 (two) times daily.     . digoxin (LANOXIN) 0.125 MG tablet Take 125 mcg by mouth daily.      . furosemide (LASIX) 20 MG tablet Take 20 mg by mouth daily.    . isosorbide mononitrate (IMDUR) 30 MG 24 hr tablet Take 30 mg by mouth daily.    [name] lisinopril (PRINIVIL,ZESTRIL) 10 MG tablet Take 10 mg by mouth daily.    . metoprolol succinate (TOPROL-XL) 25 MG 24 hr tablet Take 25 mg by mouth daily.    [name] NITROSTAT 0.4 MG SL tablet Place 0.4 mg under the tongue every 5 (five) minutes as needed for chest pain.     . Omega-3 Fatty Acids (FISH OIL) 1000 MG CAPS Take 1,000 mg by mouth 2 (two) times daily.    . Rivaroxaban (XARELTO) 15 MG TABS tablet Take 15 mg by mouth daily with supper.    . vitamin B-12 (CYANOCOBALAMIN) 1000 MCG tablet Take 1,000 mcg by mouth 2 (two) times daily.     No current  facility-administered medications for this visit.     Past Medical History  Diagnosis Date  . Diabetes mellitus   . Hypertension   . Chronic systolic CHF (congestive heart failure)     a. 12/2012 Echo: EF 20-25%, mid-dist antsept AK, mod dil LA.  [name] CAD (coronary artery disease)     a. 07/2002 CABG x 3: LIMA->LAD, VG->Diag, VG->OM;  b. 06/2006 Cath: LM 50-60ost/p, LAD patent mid stent, D1 sev dzs, D2 patent stent, LCX nl, OM2 sev sten prox, RCA large/nl, VG->Diag nl, VG->OM nl, LIMA->LAD atretic, EF 30%.  . Ischemic cardiomyopathy     a. 06/2006 s/p SJM Promote RF, model 3207-36 BiV ICD, ser #: [id number].  [name] Hypercholesterolemia   . Black lung disease   . Nephrolithiasis 09/2000  . CVA (cerebral vascular accident)   . Prostate cancer 11/01/10    gleason 7, 8, 9, gold seeds 02/08/11  . Lung cancer   . COPD (chronic obstructive pulmonary disease)     a. On home O2.  [name] GERD (gastroesophageal reflux disease)   . Arthritis   . Gout   . Carotid artery occlusion     ROS:   All systems reviewed and negative except as noted in the HPI.   Past Surgical History  Procedure Laterality Date  .  Colonoscopy    . Esophagogastroduodenoscopy  02/19/2011    Procedure: ESOPHAGOGASTRODUODENOSCOPY (EGD);  Surgeon: Jeryl Columbia, MD;  Location: [facility] ENDOSCOPY;  Service: Endoscopy;  Laterality: N/A;  . Upper endoscpopy    . Coronary angioplasty with stent placement  07/1997; 08/1997;03/1998  . Coronary artery bypass graft  07/2002    CABG X3  . Incision and drainage of wound  08/2002    right thigh; S/P EVH  . Insert / replace / remove pacemaker  1979; 1992; 01/2000;  . Insert / replace / remove pacemaker  09/2003; 06/2006    w/AICD  . Insert / replace / remove pacemaker  12/2004    pacmaker explant  . Shoulder arthroscopy w/ rotator cuff repair  05/2008    left  . Bi-ventricular pacemaker insertion (crt-p)  12-02-13    downgrade of previously implanted CRTD to STJ CRTP     Family History  Problem  Relation Age of Onset  . Alzheimer's disease Father 21  . Cancer Father 77    metastatic prostate cancer  . Diabetes Sister 33  . Diabetes Brother   . Diabetes Brother   . Diabetes Brother   . Hypotension Neg Hx   . Malignant hyperthermia Neg Hx   . Pseudochol deficiency Neg Hx      History   Social History  . Marital Status: Widowed    Spouse Name: N/A    Number of Children: N/A  . Years of Education: N/A   Occupational History  . retired     Equities trader   Social History Main Topics  . Smoking status: Former Smoker    Types: Cigarettes    Quit date: 03/12/1949  . Smokeless tobacco: Current User    Types: Chew  . Alcohol Use: No     Comment: drank until per pt 03/19/11  . Drug Use: No  . Sexual Activity: Yes   Other Topics Concern  . Not on file   Social History Narrative     BP 118/62 mmHg  Pulse 61  Ht 5\' 6"  (1.676 m)  Wt 134 lb 12.8 oz (61.145 kg)  BMI 21.77 kg/m2  Physical Exam:  Well appearing elderly man, NAD HEENT: Unremarkable Neck:  7 cm JVD, no thyromegally Lungs:  Clear with no wheezes, rales, or rhonchi. HEART:  IRegular rate rhythm, 2/6 systolic murmur, no rubs, no clicks Abd:  soft, positive bowel sounds, no organomegally, no rebound, no guarding Ext:  2 plus pulses, no edema, no cyanosis, no clubbing Skin:  No rashes no nodules Neuro:  CN II through XII intact, motor grossly intact  DEVICE  Normal device function.  See PaceArt for details.   Assess/Plan:

## 2014-02-02 NOTE — Assessment & Plan Note (Signed)
His [employer] Jude biventricular pacemaker is working normally. We'll plan to recheck in several months.

## 2014-02-02 NOTE — Patient Instructions (Signed)
Your physician wants you to follow-up in: 9 months with Dr. Knox Saliva will receive a reminder letter in the mail two months in advance. If you don't receive a letter, please call our office to schedule the follow-up appointment.  Remote monitoring is used to monitor your Pacemaker or ICD from home. This monitoring reduces the number of office visits required to check your device to one time per year. It allows [name] to keep an eye on the functioning of your device to ensure it is working properly. You are scheduled for a device check from home on 05/04/14. You may send your transmission at any time that day. If you have a wireless device, the transmission will be sent automatically. After your physician reviews your transmission, you will receive a postcard with your next transmission date.

## 2014-02-12 ENCOUNTER — Encounter: Payer: Self-pay | Admitting: Internal Medicine

## 2014-02-18 ENCOUNTER — Encounter (HOSPITAL_COMMUNITY): Payer: Self-pay | Admitting: Internal Medicine

## 2014-03-01 IMAGING — CT CT HEAD W/O CM
2 series · 16 of 30 positions shown, 20 images · non-contrast
Comparison: 12/06/2011.

CLINICAL DATA: Headaches.  On Coumadin.  Evaluate for bleed.
Vertigo, nausea and vomiting.  History prostate cancer.  Lung
cancer 5585.

CT HEAD WITHOUT CONTRAST
TECHNIQUE: Contiguous axial images were obtained from the base of
the skull through the vertex without contrast.

[Series 2: head w/o · axial · non-contrast · 0.49mm/px · z∈[+31,+161]mm · 13 of 28 slices shown, 17 images]
[im 2/28  brain]
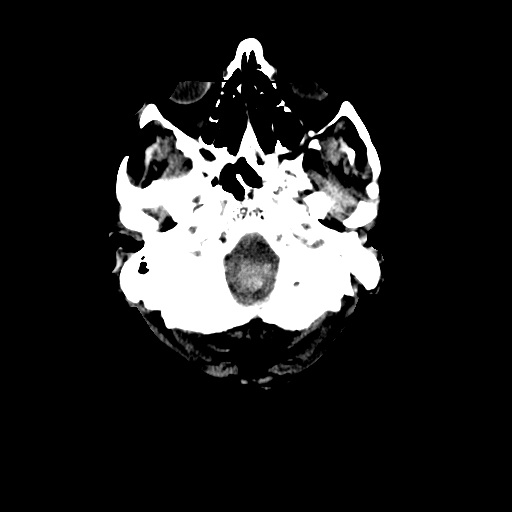
[im 2/28  bone]
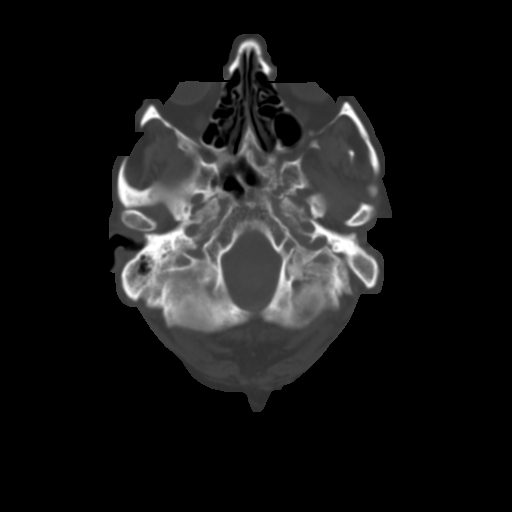
[im 4/28  brain]
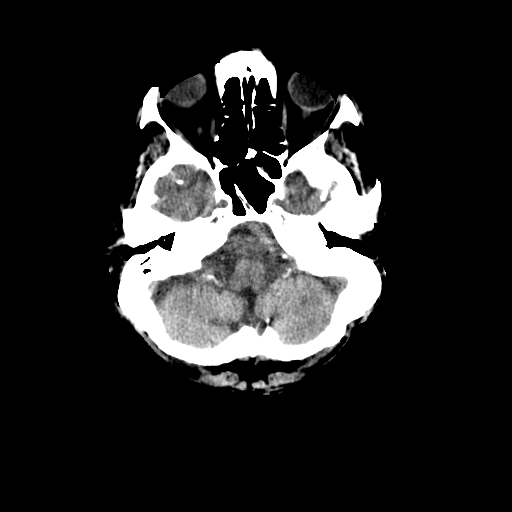
[im 6/28  brain]
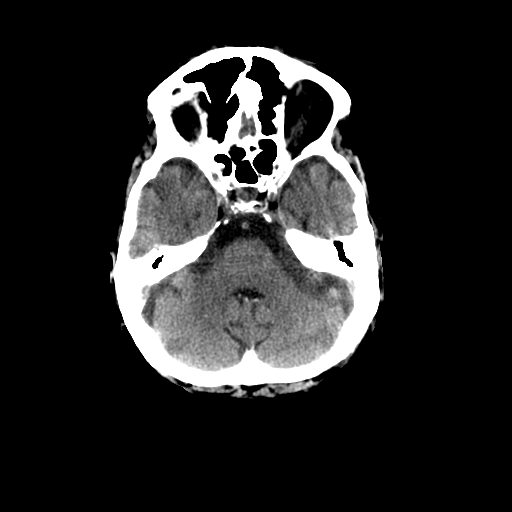
[im 8/28  brain]
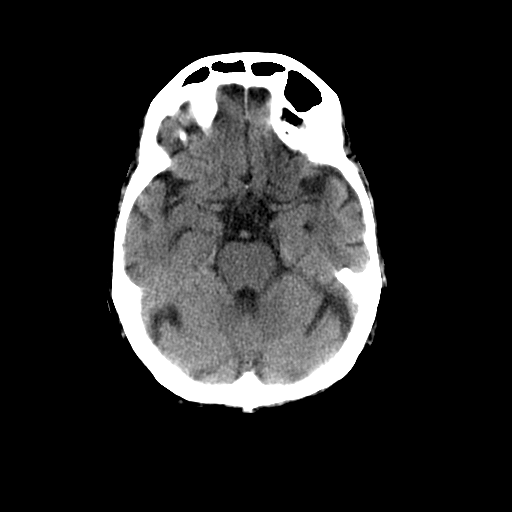
[im 10/28  brain]
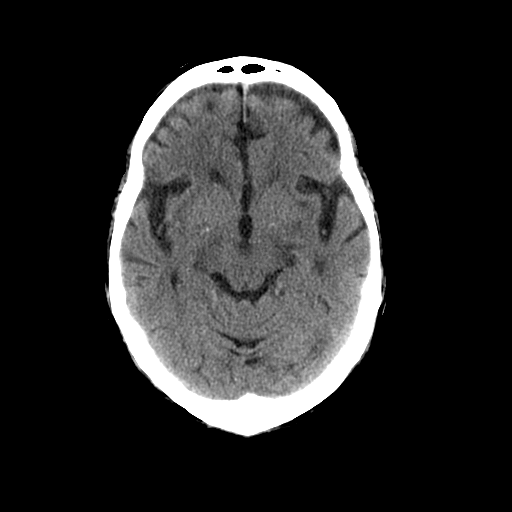
[im 10/28  bone]
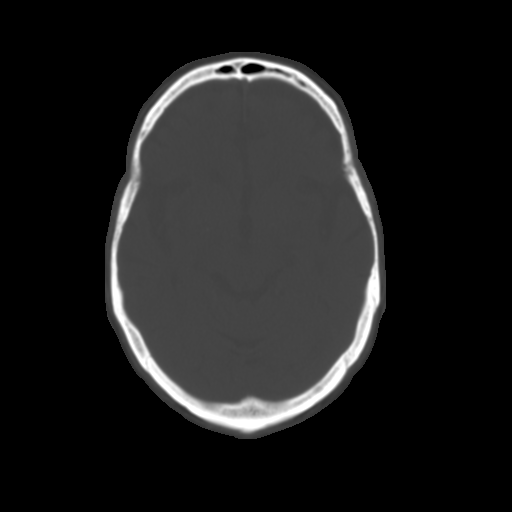
[im 12/28  brain]
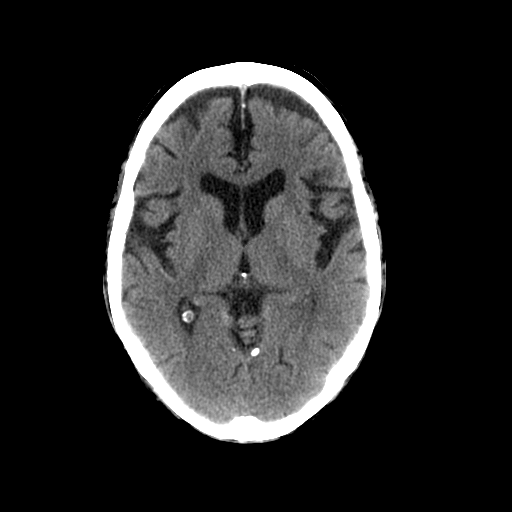
[im 14/28  brain]
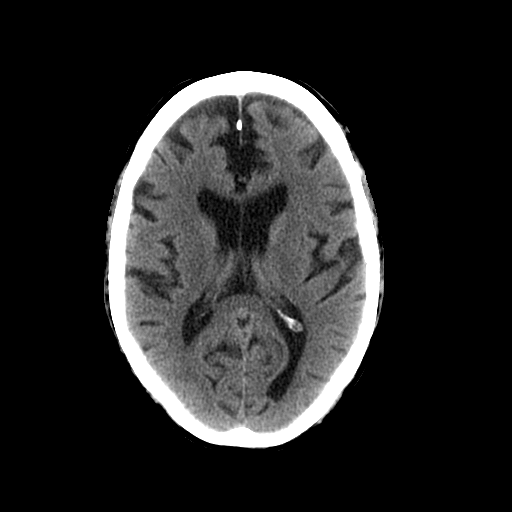
[im 16/28  brain]
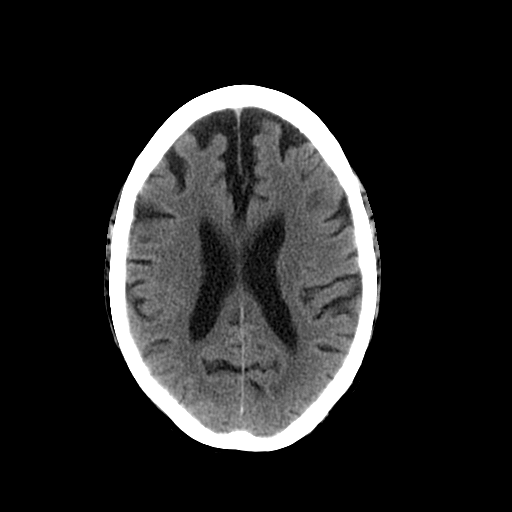
[im 18/28  brain]
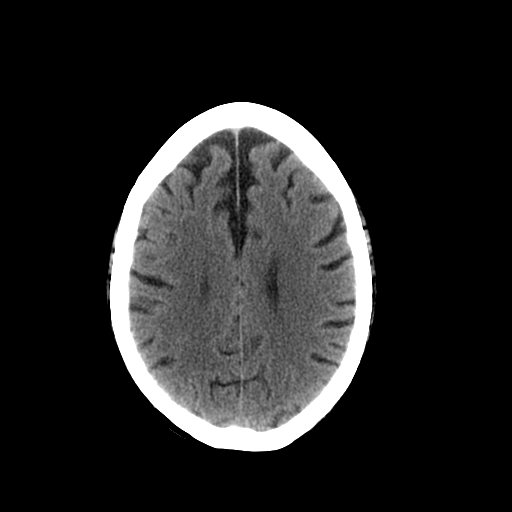
[im 18/28  bone]
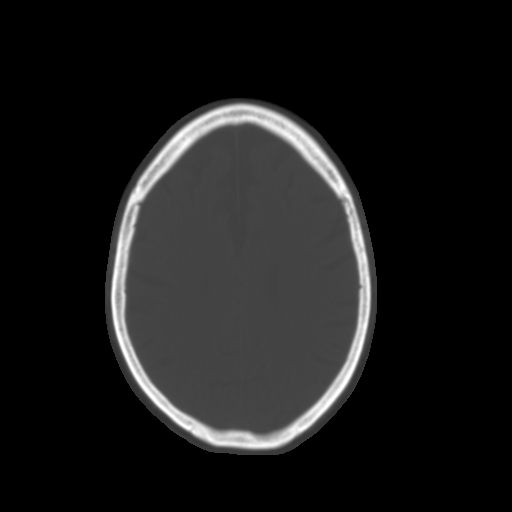
[im 20/28  brain]
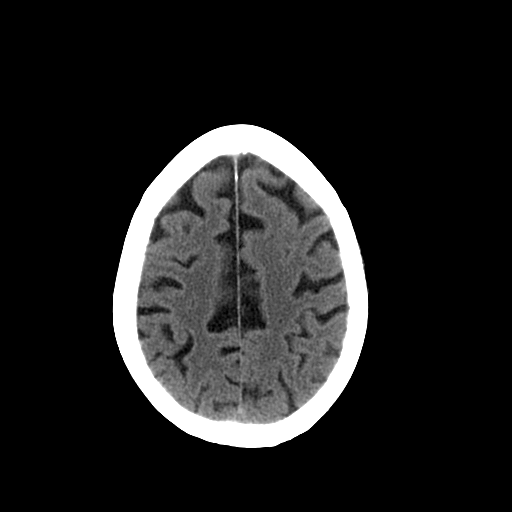
[im 22/28  brain]
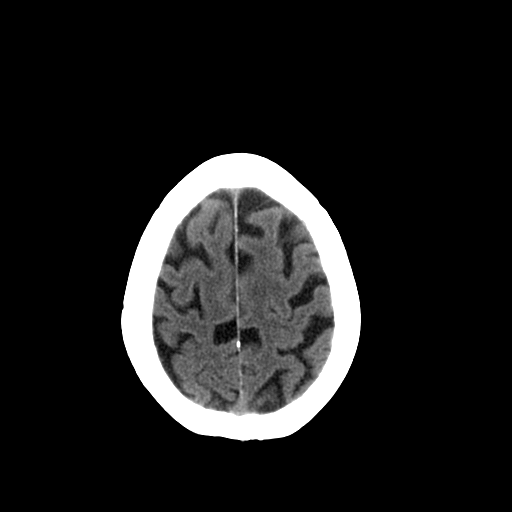
[im 24/28  brain]
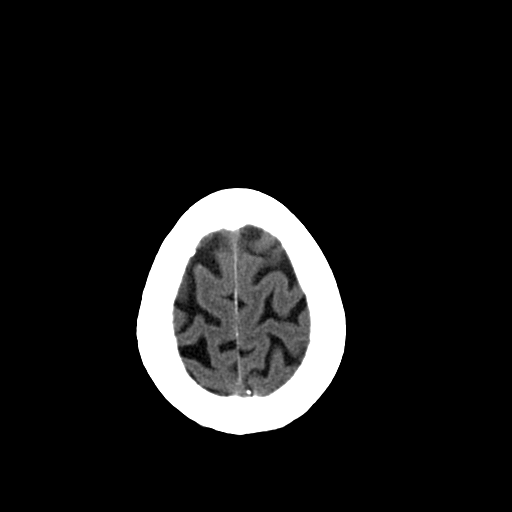
[im 26/28  brain]
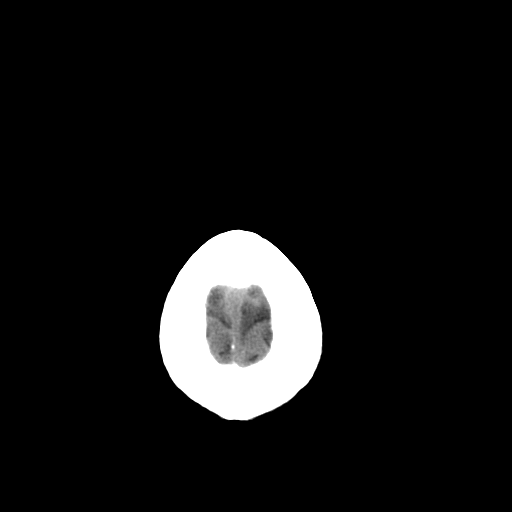
[im 26/28  bone]
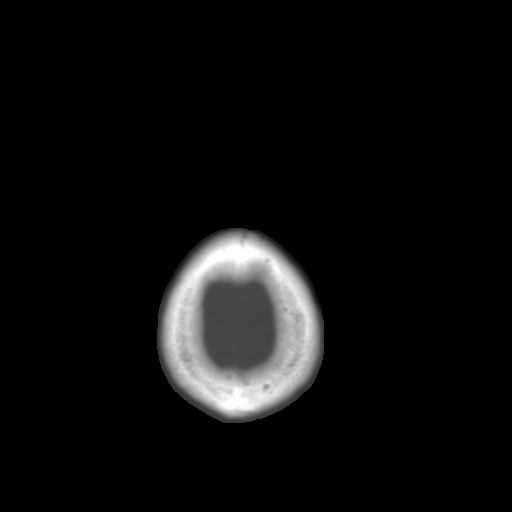

[Series 3: head bone · axial · 0.49mm/px · z∈[+31,+74]mm · 3 of 28 slices shown]
[im 2/28  bone]
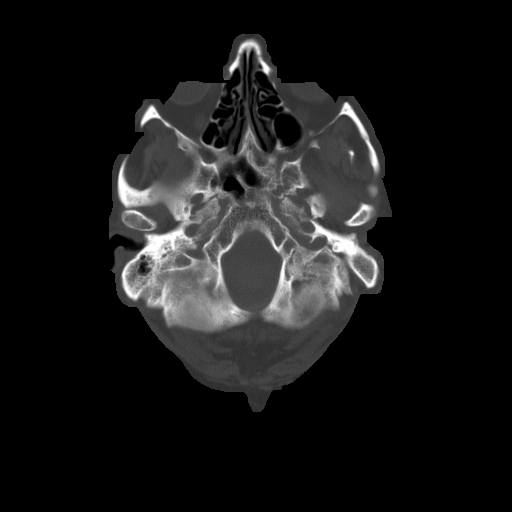
[im 6/28  bone]
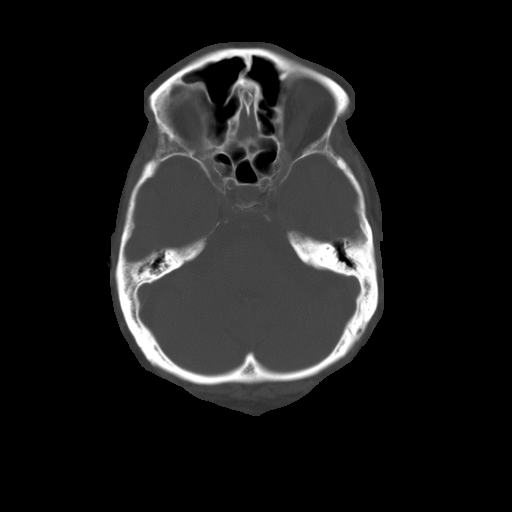
[im 10/28  bone]
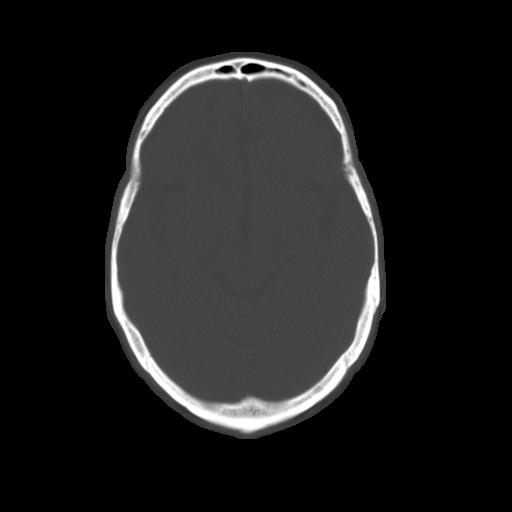

[16 of 30 positions shown; findings below may reference images not displayed]

FINDINGS: No skull fracture or intracranial hemorrhage.

Small vessel disease type changes without CT evidence of large
acute infarct.  Patient cannot have an MR secondary to the AICD
which would help evaluate for subtle posterior fossa infarct
causing the patient's symptoms.

No intracranial mass lesion detected on this unenhanced exam..

Mild global atrophy without hydrocephalus.

Vascular calcifications.
IMPRESSION: No intracranial hemorrhage or CT evidence of large acute infarct.

This is a call report..

## 2014-03-27 DIAGNOSIS — J449 Chronic obstructive pulmonary disease, unspecified: Secondary | ICD-10-CM | POA: Diagnosis not present

## 2014-04-07 DIAGNOSIS — J449 Chronic obstructive pulmonary disease, unspecified: Secondary | ICD-10-CM | POA: Diagnosis not present

## 2014-04-12 DIAGNOSIS — J449 Chronic obstructive pulmonary disease, unspecified: Secondary | ICD-10-CM | POA: Diagnosis not present

## 2014-04-17 ENCOUNTER — Emergency Department (HOSPITAL_COMMUNITY): Payer: Medicare Other

## 2014-04-17 ENCOUNTER — Encounter (HOSPITAL_COMMUNITY): Payer: Self-pay | Admitting: *Deleted

## 2014-04-17 ENCOUNTER — Emergency Department (HOSPITAL_COMMUNITY)
Admission: EM | Admit: 2014-04-17 | Discharge: 2014-04-17 | Disposition: A | Discharge status: Home / Self Care | Payer: Medicare Other | Attending: Emergency Medicine | Admitting: Emergency Medicine

## 2014-04-17 DIAGNOSIS — J441 Chronic obstructive pulmonary disease with (acute) exacerbation: Secondary | ICD-10-CM | POA: Diagnosis not present

## 2014-04-17 DIAGNOSIS — Z79899 Other long term (current) drug therapy: Secondary | ICD-10-CM | POA: Diagnosis not present

## 2014-04-17 DIAGNOSIS — Z9861 Coronary angioplasty status: Secondary | ICD-10-CM | POA: Insufficient documentation

## 2014-04-17 DIAGNOSIS — I2510 Atherosclerotic heart disease of native coronary artery without angina pectoris: Secondary | ICD-10-CM | POA: Diagnosis not present

## 2014-04-17 DIAGNOSIS — R509 Fever, unspecified: Secondary | ICD-10-CM | POA: Diagnosis not present

## 2014-04-17 DIAGNOSIS — Z9981 Dependence on supplemental oxygen: Secondary | ICD-10-CM | POA: Diagnosis not present

## 2014-04-17 DIAGNOSIS — Z87891 Personal history of nicotine dependence: Secondary | ICD-10-CM | POA: Insufficient documentation

## 2014-04-17 DIAGNOSIS — Z7901 Long term (current) use of anticoagulants: Secondary | ICD-10-CM | POA: Insufficient documentation

## 2014-04-17 DIAGNOSIS — Z950 Presence of cardiac pacemaker: Secondary | ICD-10-CM | POA: Diagnosis not present

## 2014-04-17 DIAGNOSIS — Z951 Presence of aortocoronary bypass graft: Secondary | ICD-10-CM | POA: Diagnosis not present

## 2014-04-17 DIAGNOSIS — I10 Essential (primary) hypertension: Secondary | ICD-10-CM | POA: Diagnosis not present

## 2014-04-17 DIAGNOSIS — R05 Cough: Secondary | ICD-10-CM | POA: Diagnosis not present

## 2014-04-17 DIAGNOSIS — M109 Gout, unspecified: Secondary | ICD-10-CM | POA: Insufficient documentation

## 2014-04-17 DIAGNOSIS — Z85118 Personal history of other malignant neoplasm of bronchus and lung: Secondary | ICD-10-CM | POA: Insufficient documentation

## 2014-04-17 DIAGNOSIS — Z8546 Personal history of malignant neoplasm of prostate: Secondary | ICD-10-CM | POA: Insufficient documentation

## 2014-04-17 DIAGNOSIS — Z8673 Personal history of transient ischemic attack (TIA), and cerebral infarction without residual deficits: Secondary | ICD-10-CM | POA: Insufficient documentation

## 2014-04-17 DIAGNOSIS — I5022 Chronic systolic (congestive) heart failure: Secondary | ICD-10-CM | POA: Insufficient documentation

## 2014-04-17 DIAGNOSIS — Z87442 Personal history of urinary calculi: Secondary | ICD-10-CM | POA: Insufficient documentation

## 2014-04-17 DIAGNOSIS — M1990 Unspecified osteoarthritis, unspecified site: Secondary | ICD-10-CM | POA: Diagnosis not present

## 2014-04-17 DIAGNOSIS — R0602 Shortness of breath: Secondary | ICD-10-CM | POA: Diagnosis not present

## 2014-04-17 DIAGNOSIS — E119 Type 2 diabetes mellitus without complications: Secondary | ICD-10-CM | POA: Diagnosis not present

## 2014-04-17 DIAGNOSIS — R079 Chest pain, unspecified: Secondary | ICD-10-CM | POA: Diagnosis not present

## 2014-04-17 LAB — BASIC METABOLIC PANEL
Anion gap: 12 (ref 5–15)
BUN: 10 mg/dL (ref 6–23)
CO2: 27 mmol/L (ref 19–32)
Calcium: 9.5 mg/dL (ref 8.4–10.5)
Chloride: 96 mmol/L (ref 96–112)
Creatinine, Ser: 1.17 mg/dL (ref 0.50–1.35)
GFR calc Af Amer: 67 mL/min — ABNORMAL LOW (ref >90)
GFR, EST NON AFRICAN AMERICAN: 57 mL/min — AB (ref >90)
Glucose, Bld: 120 mg/dL — ABNORMAL HIGH (ref 70–99)
POTASSIUM: 4 mmol/L (ref 3.5–5.1)
Sodium: 135 mmol/L (ref 135–145)

## 2014-04-17 LAB — CBC
HCT: 33.3 % — ABNORMAL LOW (ref 39.0–52.0)
Hemoglobin: 11.1 g/dL — ABNORMAL LOW (ref 13.0–17.0)
MCH: 30 pg (ref 26.0–34.0)
MCHC: 33.3 g/dL (ref 30.0–36.0)
MCV: 90 fL (ref 78.0–100.0)
Platelets: 143 10*3/uL — ABNORMAL LOW (ref 150–400)
RBC: 3.7 MIL/uL — AB (ref 4.22–5.81)
RDW: 16.3 % — AB (ref 11.5–15.5)
WBC: 3.4 10*3/uL — ABNORMAL LOW (ref 4.0–10.5)

## 2014-04-17 LAB — TROPONIN I: TROPONIN I: 0.03 ng/mL (ref <0.031)

## 2014-04-17 LAB — BRAIN NATRIURETIC PEPTIDE: B NATRIURETIC PEPTIDE 5: 1282.5 pg/mL — AB (ref 0.0–100.0)

## 2014-04-17 MED ORDER — LEVOFLOXACIN 750 MG PO TABS
750.0000 mg | ORAL_TABLET | Freq: Once | ORAL | Status: AC
  Administered 2014-04-17: 750 mg via ORAL
  Filled 2014-04-17: qty 1

## 2014-04-17 MED ORDER — LEVOFLOXACIN 750 MG PO TABS: 750.0000 mg | ORAL_TABLET | Freq: Every day | ORAL | Status: DC

## 2014-04-17 MED ORDER — PREDNISONE 20 MG PO TABS: 40.0000 mg | ORAL_TABLET | Freq: Every day | ORAL | Status: DC

## 2014-04-17 NOTE — Discharge Instructions (Signed)
Take Levaquin as directed until gone. Take prednisone as directed until gone. Follow up with your doctor for further evaluation. Return to the ED with worsening or concerning symptoms.

## 2014-04-17 NOTE — ED Notes (Signed)
The pt has had a couogh since Thursday.  He has a cold .  Last pm he has had  Chills and sweating .  Today he has been having chest discomfort.  Non-productive cough.

## 2014-04-17 NOTE — ED Provider Notes (Signed)
CSN: [id number]     Arrival date & time 04/17/14  1839 History   First MD Initiated Contact with Patient 04/17/14 1912     Chief Complaint  Patient presents with  . Cough     (Consider location/radiation/quality/duration/timing/severity/associated sxs/prior Treatment) HPI Comments: Patient is a 81 year old male with a past medical history of diabetes, hypertension, CHF, CAD, previous CVA, COPD, previous prostate and lung cancer who presents with a 2 day history of cough. Symptoms started gradually and progressively worsened since the onset. The cough is non productive. Patient reports feeling like he has a lot of chest congestion that will not "come up." He has associated chest soreness when he coughs. He denies fever. He reports nasal congestion. No aggravating/alleviating factors. No known sick contacts.    Past Medical History  Diagnosis Date  . Diabetes mellitus   . Hypertension   . Chronic systolic CHF (congestive heart failure)     a. 12/2012 Echo: EF 20-25%, mid-dist antsept AK, mod dil LA.  [name] CAD (coronary artery disease)     a. 07/2002 CABG x 3: LIMA->LAD, VG->Diag, VG->OM;  b. 06/2006 Cath: LM 50-60ost/p, LAD patent mid stent, D1 sev dzs, D2 patent stent, LCX nl, OM2 sev sten prox, RCA large/nl, VG->Diag nl, VG->OM nl, LIMA->LAD atretic, EF 30%.  . Ischemic cardiomyopathy     a. 06/2006 s/p SJM Promote RF, model 3207-36 BiV ICD, ser #: [id number].  [name] Hypercholesterolemia   . Black lung disease   . Nephrolithiasis 09/2000  . CVA (cerebral vascular accident)   . Prostate cancer 11/01/10    gleason 7, 8, 9, gold seeds 02/08/11  . Lung cancer   . COPD (chronic obstructive pulmonary disease)     a. On home O2.  [name] GERD (gastroesophageal reflux disease)   . Arthritis   . Gout   . Carotid artery occlusion    Past Surgical History  Procedure Laterality Date  . Colonoscopy    . Esophagogastroduodenoscopy  02/19/2011    Procedure: ESOPHAGOGASTRODUODENOSCOPY (EGD);  Surgeon: Jeryl Columbia, MD;  Location: [facility] ENDOSCOPY;  Service: Endoscopy;  Laterality: N/A;  . Upper endoscpopy    . Coronary angioplasty with stent placement  07/1997; 08/1997;03/1998  . Coronary artery bypass graft  07/2002    CABG X3  . Incision and drainage of wound  08/2002    right thigh; S/P EVH  . Insert / replace / remove pacemaker  1979; 1992; 01/2000;  . Insert / replace / remove pacemaker  09/2003; 06/2006    w/AICD  . Insert / replace / remove pacemaker  12/2004    pacmaker explant  . Shoulder arthroscopy w/ rotator cuff repair  05/2008    left  . Bi-ventricular pacemaker insertion (crt-p)  12-02-13    downgrade of previously implanted CRTD to STJ CRTP  . Biv pacemaker generator change out N/A 12/02/2013    Procedure: BIV PACEMAKER GENERATOR CHANGE OUT;  Surgeon: Evans Lance, MD;  Location: [facility] CATH LAB;  Service: Cardiovascular;  Laterality: N/A;   Family History  Problem Relation Age of Onset  . Alzheimer's disease Father 33  . Cancer Father 41    metastatic prostate cancer  . Diabetes Sister 77  . Diabetes Brother   . Diabetes Brother   . Diabetes Brother   . Hypotension Neg Hx   . Malignant hyperthermia Neg Hx   . Pseudochol deficiency Neg Hx    History  Substance Use Topics  . Smoking status: Former Smoker  Types: Cigarettes    Quit date: 03/12/1949  . Smokeless tobacco: Current User    Types: Chew  . Alcohol Use: No     Comment: drank until per pt 03/19/11    Review of Systems  Constitutional: Negative for fever, chills and fatigue.  HENT: Negative for trouble swallowing.   Eyes: Negative for visual disturbance.  Respiratory: Negative for shortness of breath.   Cardiovascular: Negative for chest pain and palpitations.  Gastrointestinal: Negative for nausea, vomiting, abdominal pain and diarrhea.  Genitourinary: Negative for dysuria and difficulty urinating.  Musculoskeletal: Negative for arthralgias and neck pain.  Skin: Negative for color change.  Neurological:  Negative for dizziness and weakness.  Psychiatric/Behavioral: Negative for dysphoric mood.      Allergies  Aspirin  Home Medications   Prior to Admission medications   Medication Sig Start Date End Date Taking? Authorizing Provider  acetaminophen (TYLENOL) 325 MG tablet Take 325 mg by mouth every 6 (six) hours as needed (pain).     Historical Provider, MD  albuterol (PROVENTIL HFA;VENTOLIN HFA) 108 (90 BASE) MCG/ACT inhaler Inhale 2 puffs into the lungs every 4 (four) hours as needed for wheezing or shortness of breath.    Historical Provider, MD  albuterol (PROVENTIL) (2.5 MG/3ML) 0.083% nebulizer solution Take 2.5 mg by nebulization every 6 (six) hours as needed for wheezing or shortness of breath.    Historical Provider, MD  allopurinol (ZYLOPRIM) 300 MG tablet Take 300 mg by mouth daily.      Historical Provider, MD  CALCIUM-VITAMIN D PO Take 1 tablet by mouth 2 (two) times daily.     Historical Provider, MD  digoxin (LANOXIN) 0.125 MG tablet Take 125 mcg by mouth daily.      Historical Provider, MD  furosemide (LASIX) 20 MG tablet Take 20 mg by mouth daily.    Historical Provider, MD  isosorbide mononitrate (IMDUR) 30 MG 24 hr tablet Take 30 mg by mouth daily. 07/27/13   Historical Provider, MD  lisinopril (PRINIVIL,ZESTRIL) 10 MG tablet Take 10 mg by mouth daily.    Historical Provider, MD  metoprolol succinate (TOPROL-XL) 25 MG 24 hr tablet Take 25 mg by mouth daily. 07/27/13   Historical Provider, MD  NITROSTAT 0.4 MG SL tablet Place 0.4 mg under the tongue every 5 (five) minutes as needed for chest pain.  10/17/11   Historical Provider, MD  Omega-3 Fatty Acids (FISH OIL) 1000 MG CAPS Take 1,000 mg by mouth 2 (two) times daily.    Historical Provider, MD  Rivaroxaban (XARELTO) 15 MG TABS tablet Take 15 mg by mouth daily with supper.    Historical Provider, MD  vitamin B-12 (CYANOCOBALAMIN) 1000 MCG tablet Take 1,000 mcg by mouth 2 (two) times daily.    Historical Provider, MD   BP  119/80 mmHg  Pulse 60  Temp(Src) 98.5 F (36.9 C) (Oral)  Resp 20  SpO2 100% Physical Exam  Constitutional: He is oriented to person, place, and time. He appears well-developed and well-nourished. No distress.  HENT:  Head: Normocephalic and atraumatic.  Eyes: Conjunctivae and EOM are normal.  Neck: Normal range of motion.  Cardiovascular: Normal rate and regular rhythm.  Exam reveals no gallop and no friction rub.   No murmur heard. Pulmonary/Chest: Effort normal. He has wheezes. He has no rales. He exhibits no tenderness.  Expiratory rhonchi and wheeze noted in the RLL. Patient oxygen dependent on nasal cannula  Abdominal: Soft. He exhibits no distension. There is no tenderness. There is no rebound  and no guarding.  Musculoskeletal: Normal range of motion.  No lower extremity edema or calf tenderness to palpation.   Neurological: He is alert and oriented to person, place, and time. Coordination normal.  Speech is goal-oriented. Moves limbs without ataxia.   Skin: Skin is warm and dry.  Psychiatric: He has a normal mood and affect. His behavior is normal.  Nursing note and vitals reviewed.   ED Course  Procedures (including critical care time) Labs Review Labs Reviewed  CBC - Abnormal; Notable for the following:    WBC 3.4 (*)    RBC 3.70 (*)    Hemoglobin 11.1 (*)    HCT 33.3 (*)    RDW 16.3 (*)    Platelets 143 (*)    All other components within normal limits  BASIC METABOLIC PANEL - Abnormal; Notable for the following:    Glucose, Bld 120 (*)    GFR calc non Af Amer 57 (*)    GFR calc Af Amer 67 (*)    All other components within normal limits  BRAIN NATRIURETIC PEPTIDE - Abnormal; Notable for the following:    B Natriuretic Peptide 1282.5 (*)    All other components within normal limits  TROPONIN I    Imaging Review Dg Chest 2 View  04/17/2014   CLINICAL DATA:  Chest pain, fever, cough and shortness of breath.  EXAM: CHEST  2 VIEW  COMPARISON:  08/06/2013   FINDINGS: The heart is enlarged but stable. Stable pacer wires and AICD. Stable surgical changes from bypass surgery. Stable emphysematous changes and interstitial lung disease with areas of pulmonary scarring. No definite acute overlying pulmonary process. Stable left-sided pleural thickening. The bony thorax is intact.  IMPRESSION: Chronic emphysematous changes, interstitial lung disease and pulmonary scarring. No definite acute overlying pulmonary process.   Electronically Signed   By: Kalman Jewels M.D.   On: 04/17/2014 20:12     EKG Interpretation None      MDM   Final diagnoses:  COPD exacerbation    7:26 PM Labs and chest xray pending. Vitals stable and patient afebrile.   9:04 PM Labs and chest xray show no acute changes. Patient will be treated for pneumonia or possible COPD exacerbation with Levaquin. Patient has focal lung finding in the RLL on exam. Patient has supplemental oxygen at home. Patient advised to return with worsening or concerning symptoms and follow up with PCP for further evaluation as needed.   Alvina Chou, PA-C 04/17/14 2116  Charlesetta Shanks, MD 04/17/14 [phone]

## 2014-04-20 DIAGNOSIS — E119 Type 2 diabetes mellitus without complications: Secondary | ICD-10-CM | POA: Diagnosis not present

## 2014-04-27 DIAGNOSIS — J449 Chronic obstructive pulmonary disease, unspecified: Secondary | ICD-10-CM | POA: Diagnosis not present

## 2014-05-05 ENCOUNTER — Ambulatory Visit (INDEPENDENT_AMBULATORY_CARE_PROVIDER_SITE_OTHER): Payer: Medicare Other | Admitting: *Deleted

## 2014-05-05 DIAGNOSIS — I482 Chronic atrial fibrillation, unspecified: Secondary | ICD-10-CM

## 2014-05-05 NOTE — Progress Notes (Signed)
Remote pacemaker transmission.   

## 2014-05-07 ENCOUNTER — Encounter: Payer: Self-pay | Admitting: Cardiology

## 2014-05-07 LAB — MDC_IDC_ENUM_SESS_TYPE_REMOTE
Battery Voltage: 2.99 V
Brady Statistic RV Percent Paced: 99 %
Date Time Interrogation Session: 20160224070018
Implantable Pulse Generator Model: 3222
Lead Channel Impedance Value: 540 Ohm
Lead Channel Pacing Threshold Amplitude: 0.75 V
Lead Channel Pacing Threshold Pulse Width: 0.5 ms
Lead Channel Setting Pacing Amplitude: 3 V
Lead Channel Setting Pacing Pulse Width: 0.5 ms
MDC IDC MSMT BATTERY REMAINING LONGEVITY: 69 mo
MDC IDC MSMT BATTERY REMAINING PERCENTAGE: 95 %
MDC IDC MSMT LEADCHNL LV IMPEDANCE VALUE: 630 Ohm
MDC IDC MSMT LEADCHNL LV PACING THRESHOLD AMPLITUDE: 2 V
MDC IDC MSMT LEADCHNL LV PACING THRESHOLD PULSEWIDTH: 0.8 ms
MDC IDC MSMT LEADCHNL RV SENSING INTR AMPL: 12 mV
MDC IDC PG SERIAL: 7634311
MDC IDC SET LEADCHNL LV PACING PULSEWIDTH: 0.8 ms
MDC IDC SET LEADCHNL RV PACING AMPLITUDE: 2.5 V
MDC IDC SET LEADCHNL RV SENSING SENSITIVITY: 2 mV

## 2014-05-14 ENCOUNTER — Encounter: Payer: Self-pay | Admitting: Cardiology

## 2014-05-19 ENCOUNTER — Encounter: Payer: Self-pay | Admitting: Internal Medicine

## 2014-05-26 DIAGNOSIS — J449 Chronic obstructive pulmonary disease, unspecified: Secondary | ICD-10-CM | POA: Diagnosis not present

## 2014-06-24 DIAGNOSIS — C61 Malignant neoplasm of prostate: Secondary | ICD-10-CM | POA: Diagnosis not present

## 2014-06-26 DIAGNOSIS — J449 Chronic obstructive pulmonary disease, unspecified: Secondary | ICD-10-CM | POA: Diagnosis not present

## 2014-07-01 DIAGNOSIS — C61 Malignant neoplasm of prostate: Secondary | ICD-10-CM | POA: Diagnosis not present

## 2014-07-17 IMAGING — CR DG CHEST 2V
1 series · 1 of 1 positions shown · non-contrast
Comparison: 07/03/2012

CLINICAL DATA: Chest pain, weakness

EXAM:
CHEST  2 VIEW

[w chest lat]
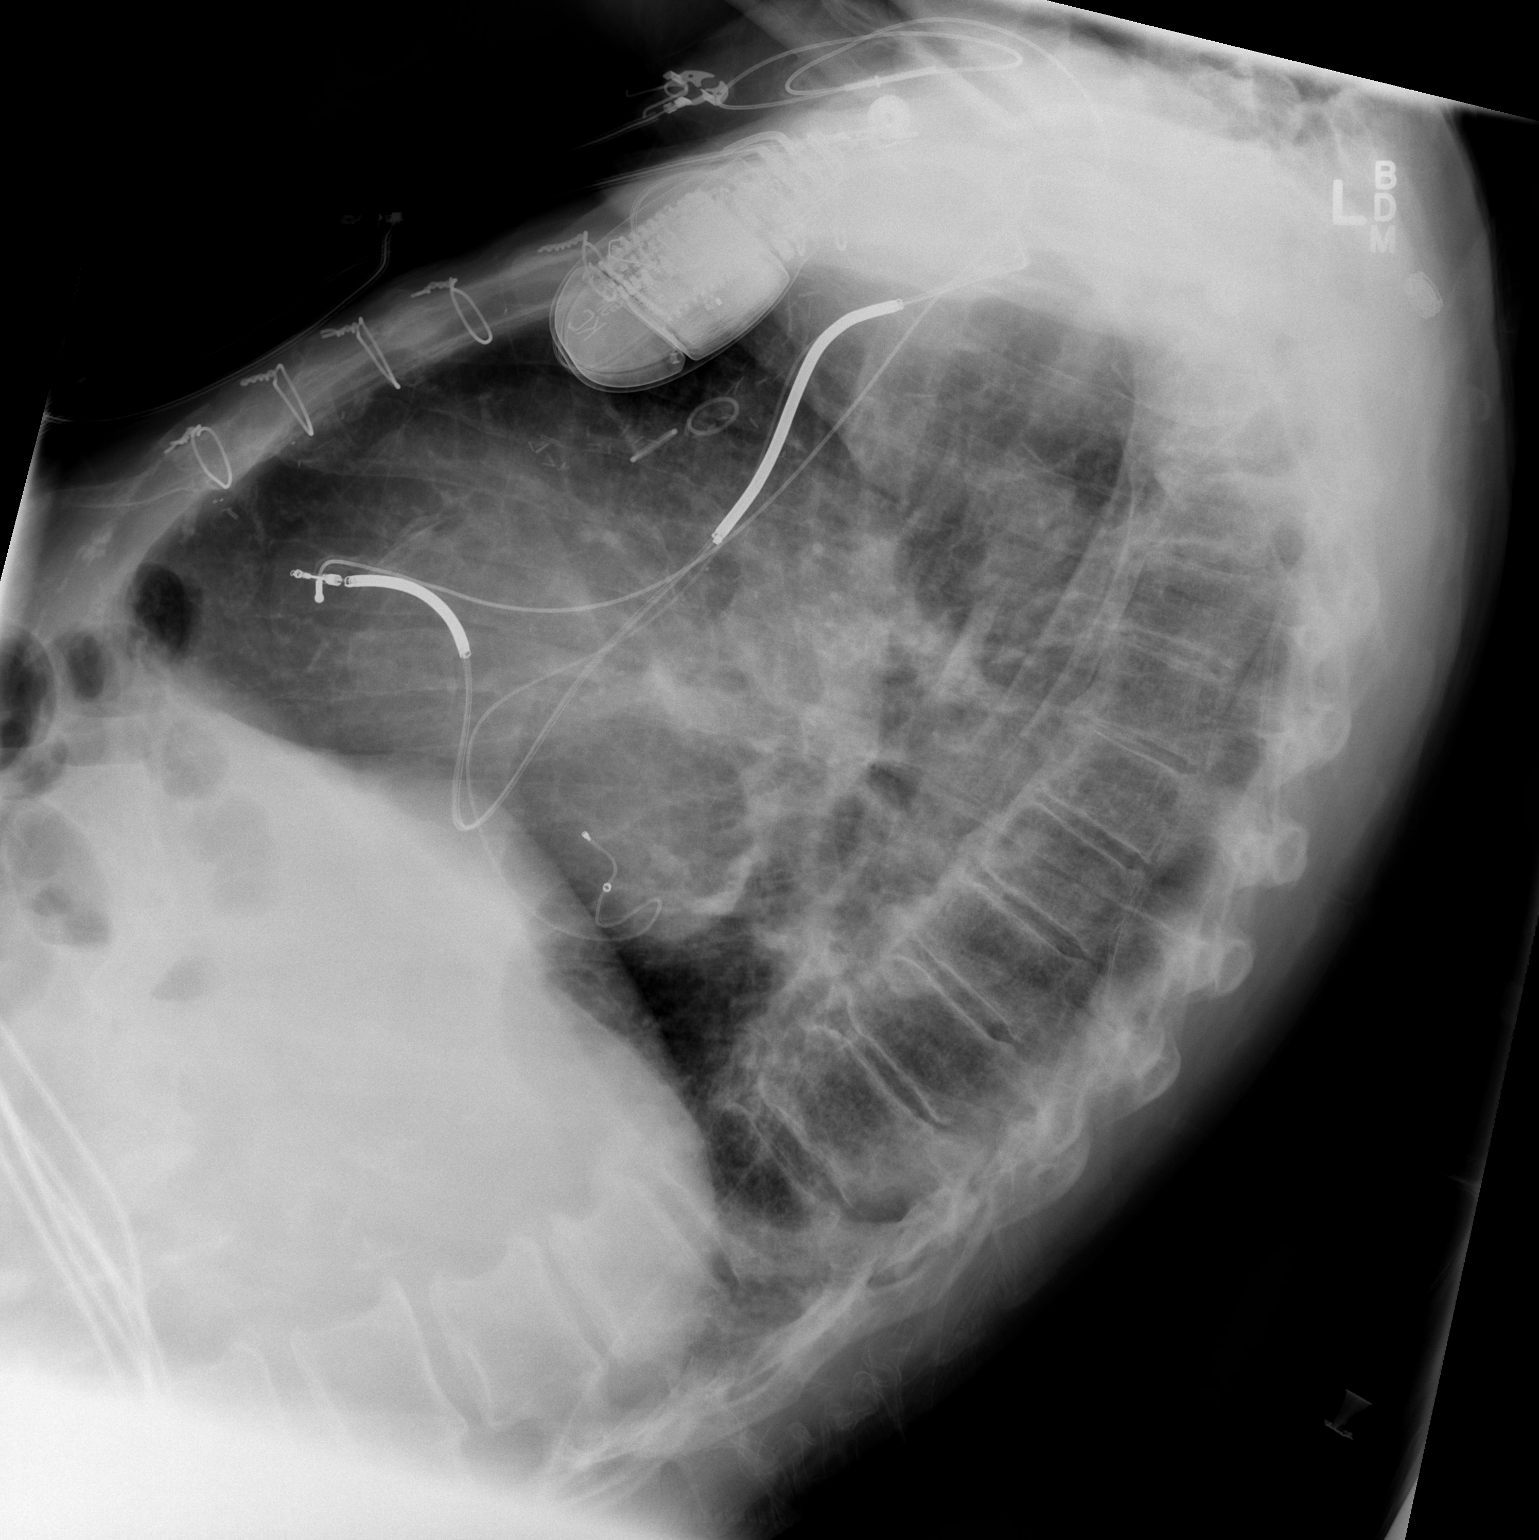

[1 of 1 positions shown; findings below may reference images not displayed]

FINDINGS: Cardiomediastinal silhouette is stable. Again noted status post
CABG. Cardiac pacemaker leads are unchanged in position. Stable
bilateral mid lung scarring. Probable chronic mild interstitial
prominence without convincing pulmonary edema. No segmental
infiltrate. Osteopenia and mild degenerative changes thoracic spine
again noted.
IMPRESSION: Again noted status post CABG. Cardiac pacemaker leads are unchanged
in position. Stable bilateral mid lung scarring. Probable chronic
mild interstitial prominence without convincing pulmonary edema. No
segmental infiltrate.

## 2014-07-26 DIAGNOSIS — J449 Chronic obstructive pulmonary disease, unspecified: Secondary | ICD-10-CM | POA: Diagnosis not present

## 2014-07-27 DIAGNOSIS — E119 Type 2 diabetes mellitus without complications: Secondary | ICD-10-CM | POA: Diagnosis not present

## 2014-08-05 ENCOUNTER — Ambulatory Visit (INDEPENDENT_AMBULATORY_CARE_PROVIDER_SITE_OTHER): Payer: Medicare Other | Admitting: *Deleted

## 2014-08-05 DIAGNOSIS — I482 Chronic atrial fibrillation, unspecified: Secondary | ICD-10-CM

## 2014-08-05 NOTE — Progress Notes (Signed)
Remote pacemaker transmission.   

## 2014-08-13 LAB — CUP PACEART REMOTE DEVICE CHECK
Battery Remaining Longevity: 67 mo
Date Time Interrogation Session: 20160526103511
Lead Channel Impedance Value: 530 Ohm
Lead Channel Impedance Value: 640 Ohm
Lead Channel Pacing Threshold Amplitude: 0.75 V
Lead Channel Pacing Threshold Amplitude: 2 V
Lead Channel Pacing Threshold Pulse Width: 0.5 ms
Lead Channel Pacing Threshold Pulse Width: 0.8 ms
Lead Channel Sensing Intrinsic Amplitude: 12 mV
Lead Channel Setting Pacing Amplitude: 3 V
Lead Channel Setting Pacing Pulse Width: 0.5 ms
Lead Channel Setting Sensing Sensitivity: 2 mV
MDC IDC MSMT BATTERY REMAINING PERCENTAGE: 93 %
MDC IDC MSMT BATTERY VOLTAGE: 2.99 V
MDC IDC SET LEADCHNL LV PACING PULSEWIDTH: 0.8 ms
MDC IDC SET LEADCHNL RV PACING AMPLITUDE: 2.5 V
Pulse Gen Model: 3222
Pulse Gen Serial Number: 7634311

## 2014-08-20 ENCOUNTER — Encounter: Payer: Self-pay | Admitting: Cardiology

## 2014-08-24 ENCOUNTER — Encounter: Payer: Self-pay | Admitting: Internal Medicine

## 2014-08-26 DIAGNOSIS — J449 Chronic obstructive pulmonary disease, unspecified: Secondary | ICD-10-CM | POA: Diagnosis not present

## 2014-09-02 DIAGNOSIS — I10 Essential (primary) hypertension: Secondary | ICD-10-CM | POA: Diagnosis not present

## 2014-09-02 DIAGNOSIS — I482 Chronic atrial fibrillation: Secondary | ICD-10-CM | POA: Diagnosis not present

## 2014-09-02 DIAGNOSIS — Z23 Encounter for immunization: Secondary | ICD-10-CM | POA: Diagnosis not present

## 2014-09-02 DIAGNOSIS — E785 Hyperlipidemia, unspecified: Secondary | ICD-10-CM | POA: Diagnosis not present

## 2014-09-02 DIAGNOSIS — M109 Gout, unspecified: Secondary | ICD-10-CM | POA: Diagnosis not present

## 2014-09-02 DIAGNOSIS — I509 Heart failure, unspecified: Secondary | ICD-10-CM | POA: Diagnosis not present

## 2014-09-06 ENCOUNTER — Encounter: Payer: Self-pay | Admitting: Internal Medicine

## 2014-09-06 DIAGNOSIS — I10 Essential (primary) hypertension: Secondary | ICD-10-CM | POA: Diagnosis not present

## 2014-09-06 DIAGNOSIS — I4891 Unspecified atrial fibrillation: Secondary | ICD-10-CM | POA: Diagnosis not present

## 2014-09-06 DIAGNOSIS — I5020 Unspecified systolic (congestive) heart failure: Secondary | ICD-10-CM | POA: Diagnosis not present

## 2014-09-06 DIAGNOSIS — E119 Type 2 diabetes mellitus without complications: Secondary | ICD-10-CM | POA: Diagnosis not present

## 2014-09-16 IMAGING — CT CT CERVICAL SPINE W/O CM
3 of 5 series · 9 of 33 positions shown, 10 images · non-contrast
Comparison: CT of the head performed 07/31/2012, and CT of the
cervical spine performed 12/21/2011

CLINICAL DATA: Two episodes of syncope; status post falls. Concern
for head or cervical spine injury.

EXAM:
CT HEAD WITHOUT CONTRAST
CT CERVICAL SPINE WITHOUT CONTRAST
TECHNIQUE: Multidetector CT imaging of the head and cervical spine was
performed following the standard protocol without intravenous
contrast. Multiplanar CT image reconstructions of the cervical spine
were also generated.

[Series 5: c_spine 2.0 i40s 3 · axial · 0.29mm/px · z∈[-182,-182]mm · 1 of 93 slices shown, 2 images]
[im 56/93  soft-tissue]
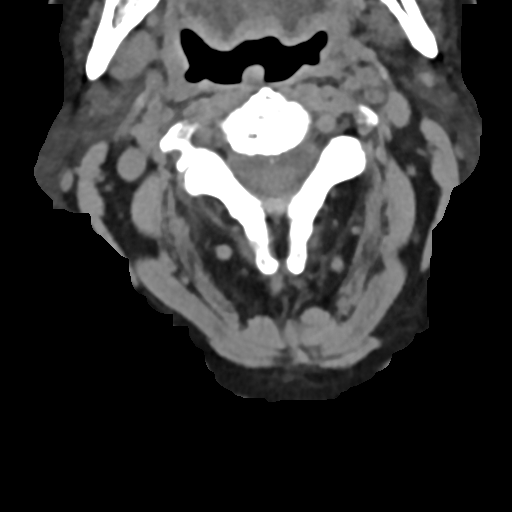
[im 56/93  bone]
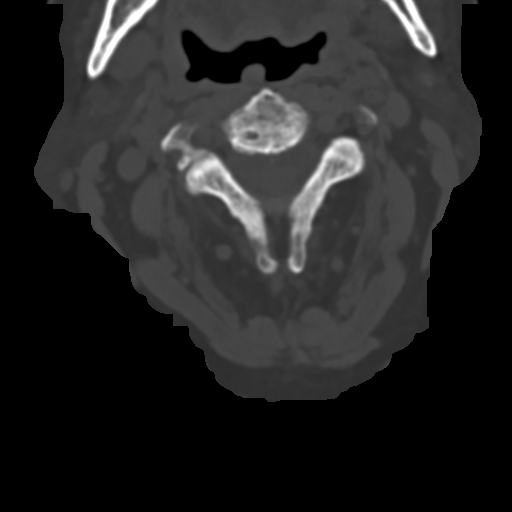

[Series 7: coronals · coronal · 0.19mm/px · 3 of 55 slices shown]
[im 11/55  bone]
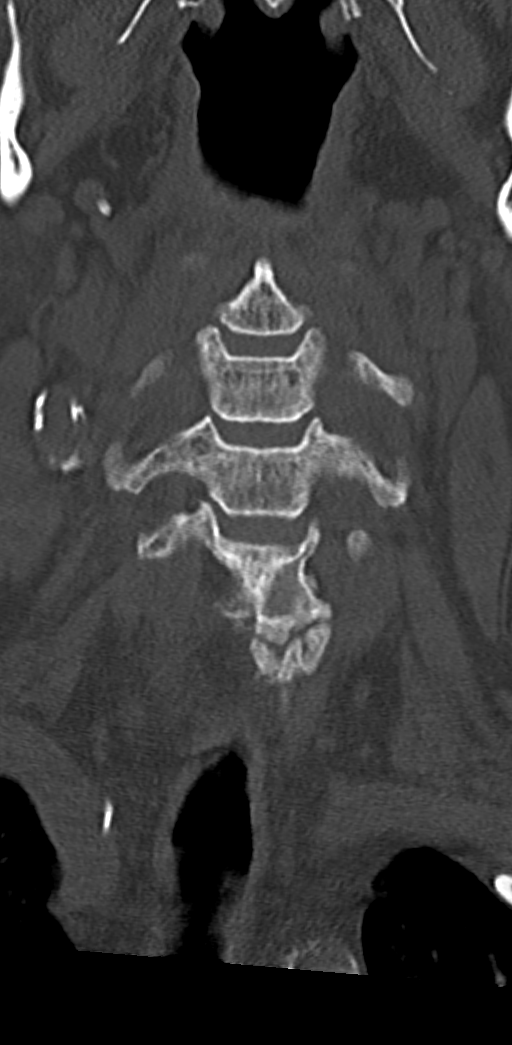
[im 22/55  bone]
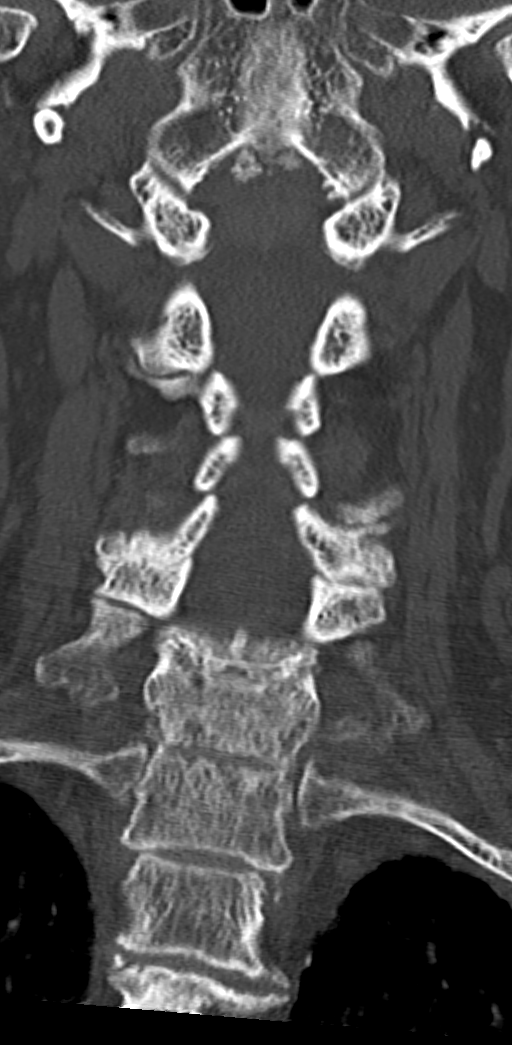
[im 33/55  bone]
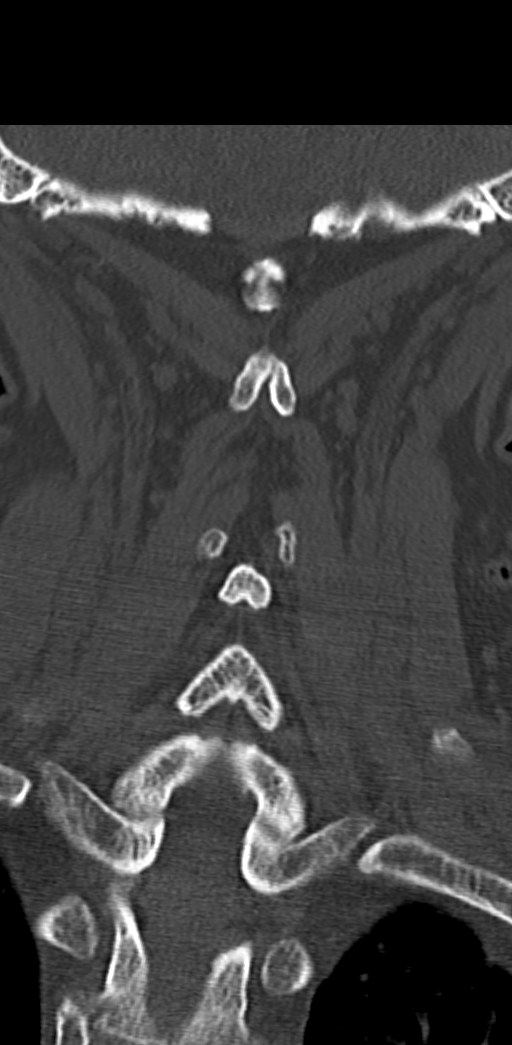

[Series 8: sagittals · sagittal · 0.19mm/px · 5 of 52 slices shown]
[im 18/52  bone]
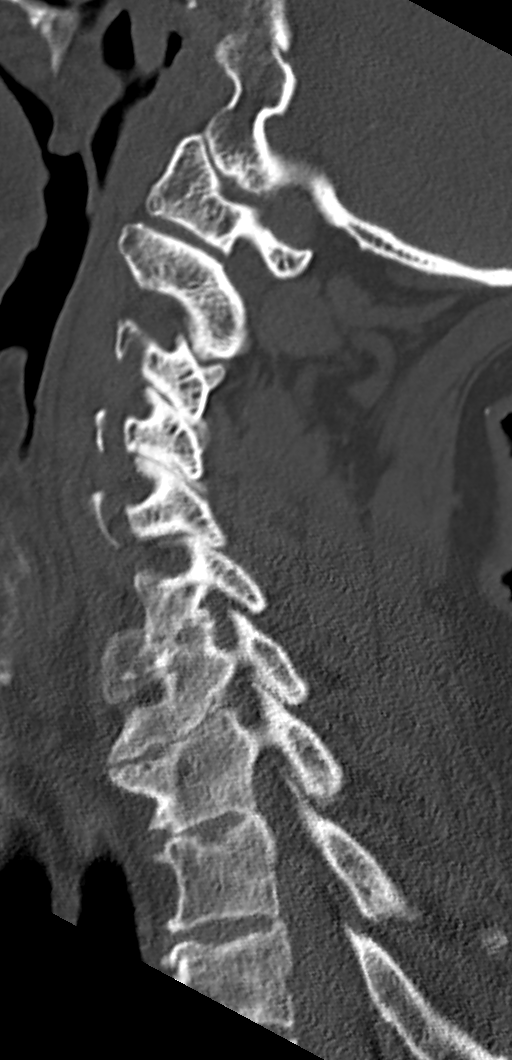
[im 22/52  bone]
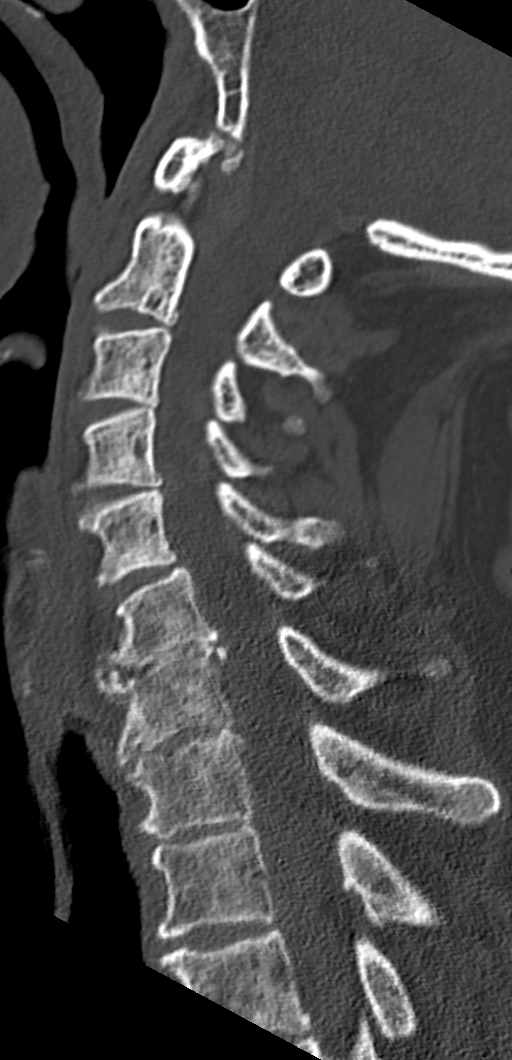
[im 26/52  bone]
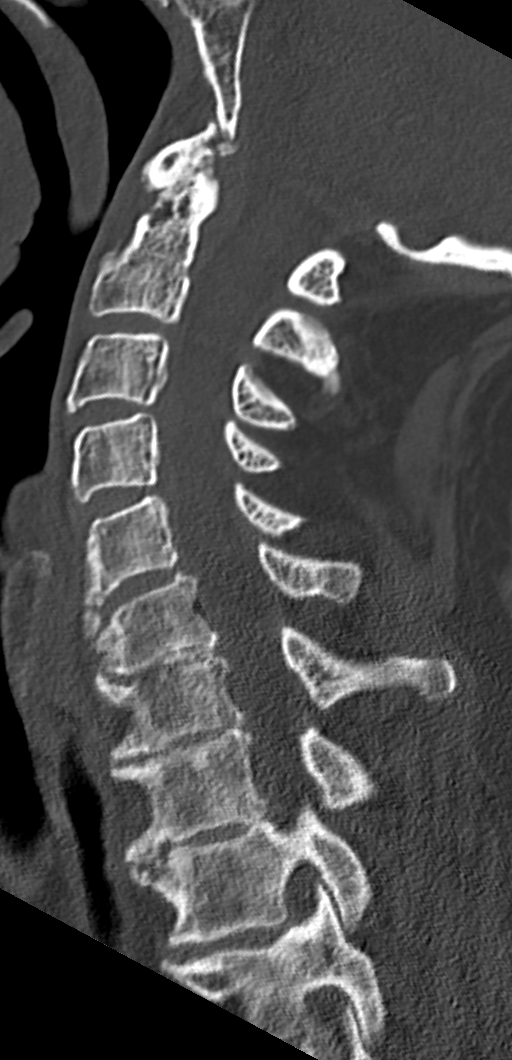
[im 30/52  bone]
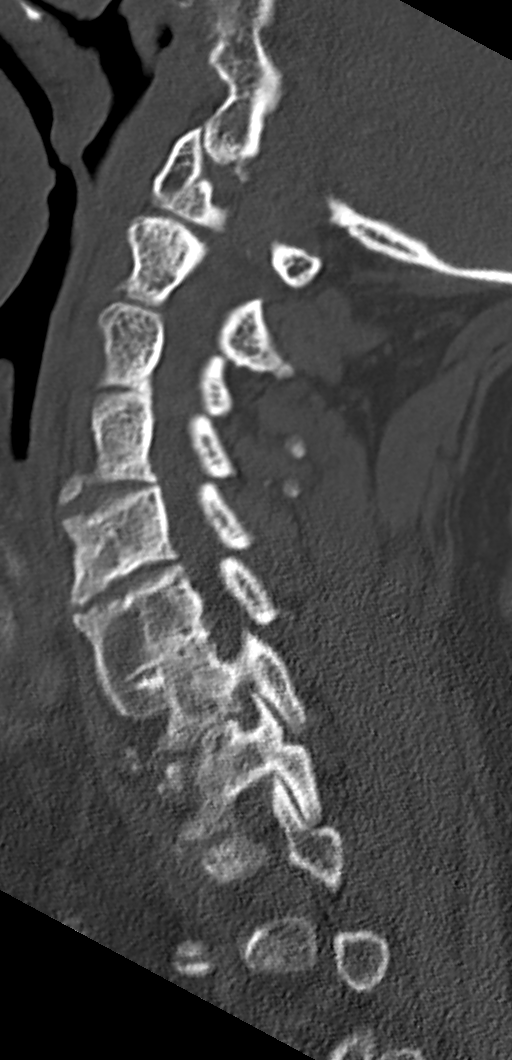
[im 35/52  bone]
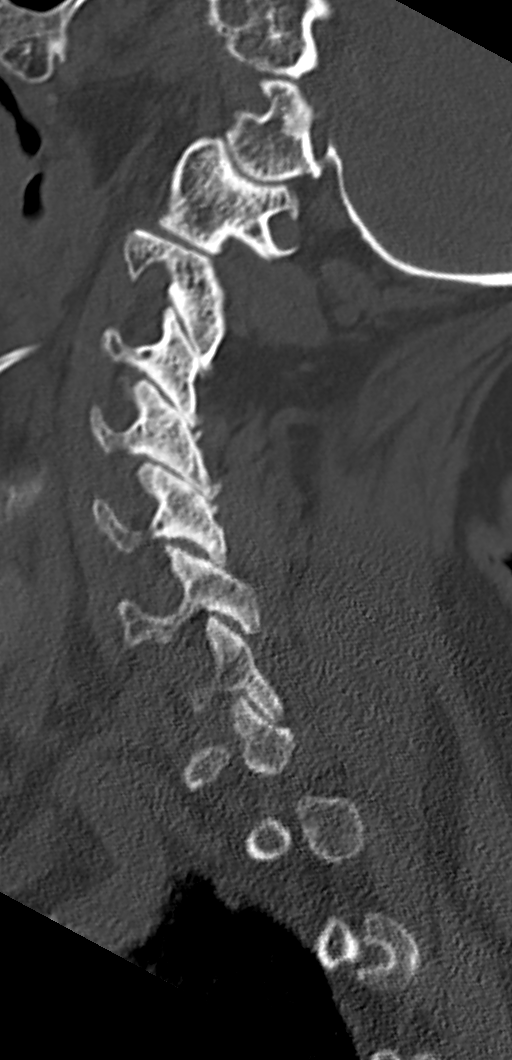

[9 of 33 positions shown; findings below may reference images not displayed]

FINDINGS: CT HEAD FINDINGS

There is no evidence of acute infarction, mass lesion, or intra- or
extra-axial hemorrhage on CT.

Prominence of the ventricles and sulci reflects mild to moderate
cortical volume loss. Mild subcortical white matter change likely
reflects small vessel ischemic microangiopathy. Cerebellar atrophy
is noted.

The brainstem and fourth ventricle are within normal limits. The
basal ganglia are unremarkable in appearance. The cerebral
hemispheres demonstrate grossly normal gray-white differentiation.
No mass effect or midline shift is seen.

There is no evidence of fracture; visualized osseous structures are
unremarkable in appearance. The orbits are within normal limits. The
paranasal sinuses and mastoid air cells are well-aerated. No
significant soft tissue abnormalities are seen.

CT CERVICAL SPINE FINDINGS

There is no evidence of fracture or subluxation. Vertebral bodies
demonstrate normal height and alignment. There is mild multilevel
disc space narrowing along the lower cervical spine. Prominent
lateral osteophytes are seen at the lower cervical spine, and mild
underlying facet disease is seen. Prevertebral soft tissues are
within normal limits. The visualized neural foramina are grossly
unremarkable.

The thyroid gland is unremarkable in appearance. The visualized lung
apices are clear. Dense calcification is noted at the carotid
bifurcations bilaterally, worse on the right. A 1.8 cm peripherally
calcified mass is noted at the superior aspect of the left parotid
gland; this is grossly stable from 1104 and likely benign.
IMPRESSION: 1. No evidence of traumatic intracranial injury or fracture.
2. No evidence of fracture or subluxation along the cervical spine.
3. Mild to moderate cortical volume loss and scattered small vessel
ischemic microangiopathy.
4. Dense calcification at the carotid bifurcations bilaterally,
worse on the right. On the prior carotid ultrasound from 1104, there
was moderate right-sided and mild left-sided carotid stenosis.
5. Mild degenerative change noted along the cervical spine.

## 2014-09-16 IMAGING — CR DG PELVIS 1-2V
1 series · 1 of 1 positions shown · non-contrast
Comparison: None.

CLINICAL DATA: Fell.  Left buttock pain.

EXAM:
PELVIS - 1-2 VIEW

[t pelvis ap]
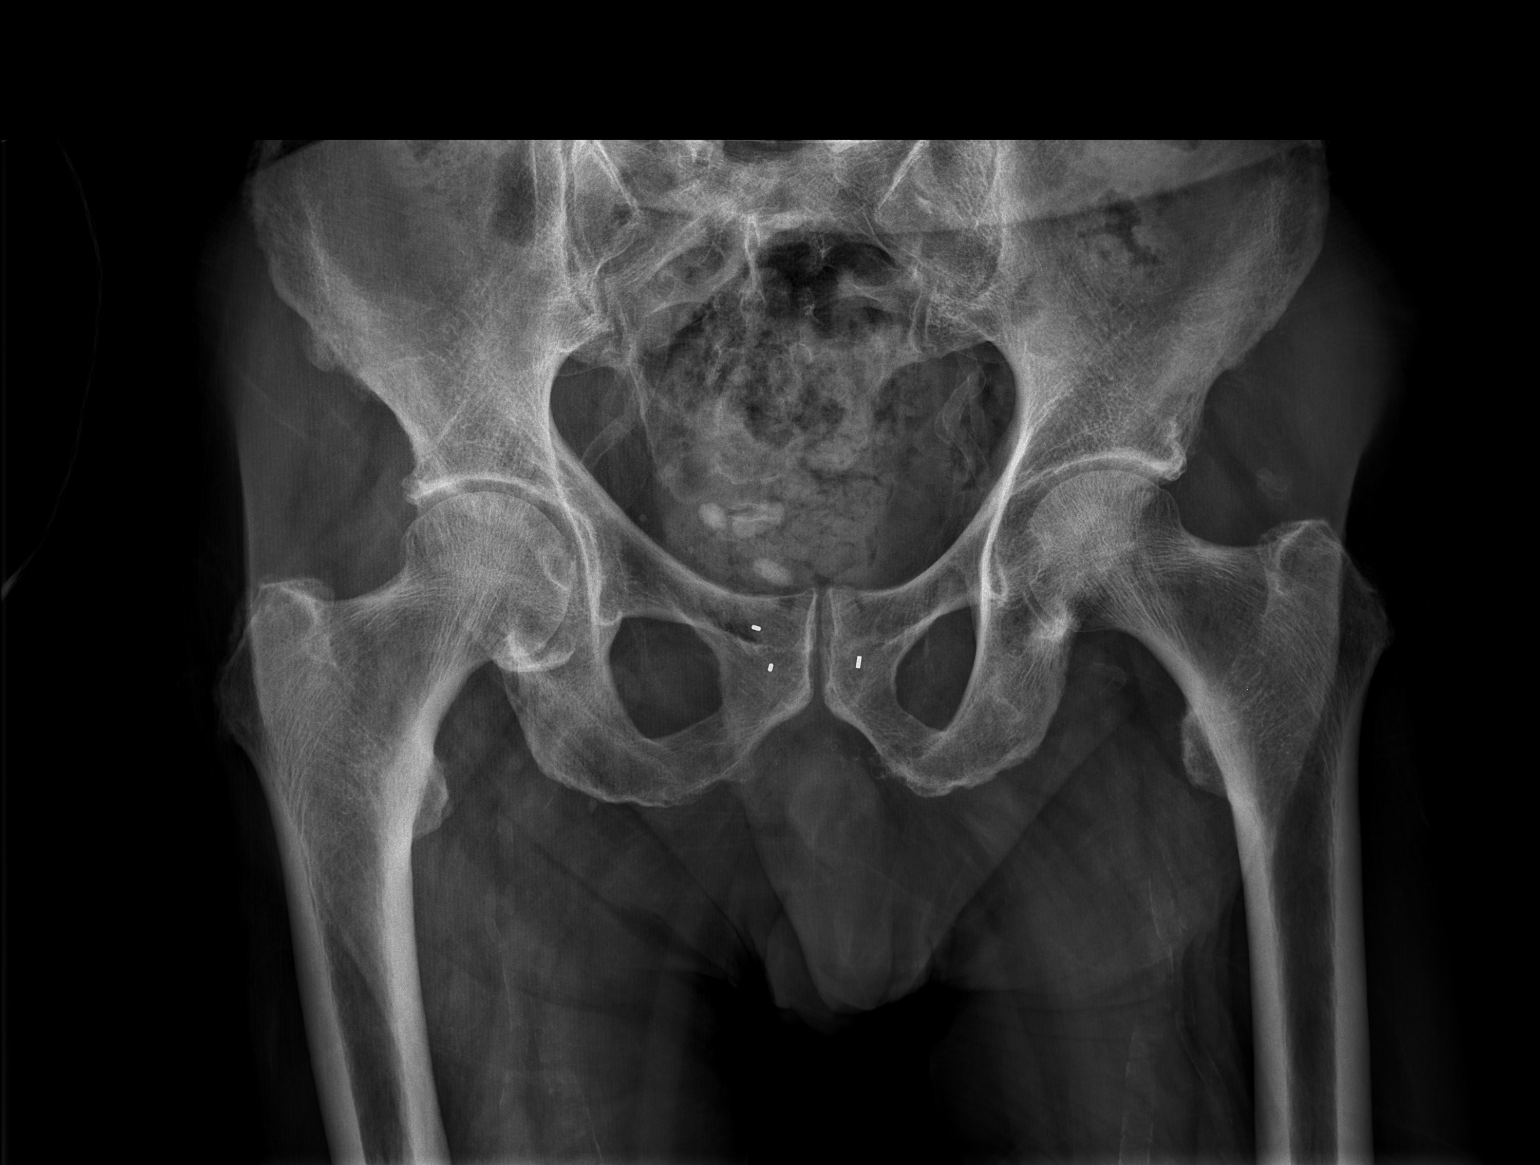

[1 of 1 positions shown; findings below may reference images not displayed]

FINDINGS: Both hips are normally located. No acute hip fracture. The pubic
symphysis and SI joints are intact. No pelvic fractures.
IMPRESSION: No acute bony findings.

## 2014-09-16 IMAGING — CR DG CHEST 2V
2 series · 2 of 2 positions shown · non-contrast
Comparison: 12/16/2012.

CLINICAL DATA: Fell.  Chest pain.

EXAM:
CHEST  2 VIEW

[w chest pa]
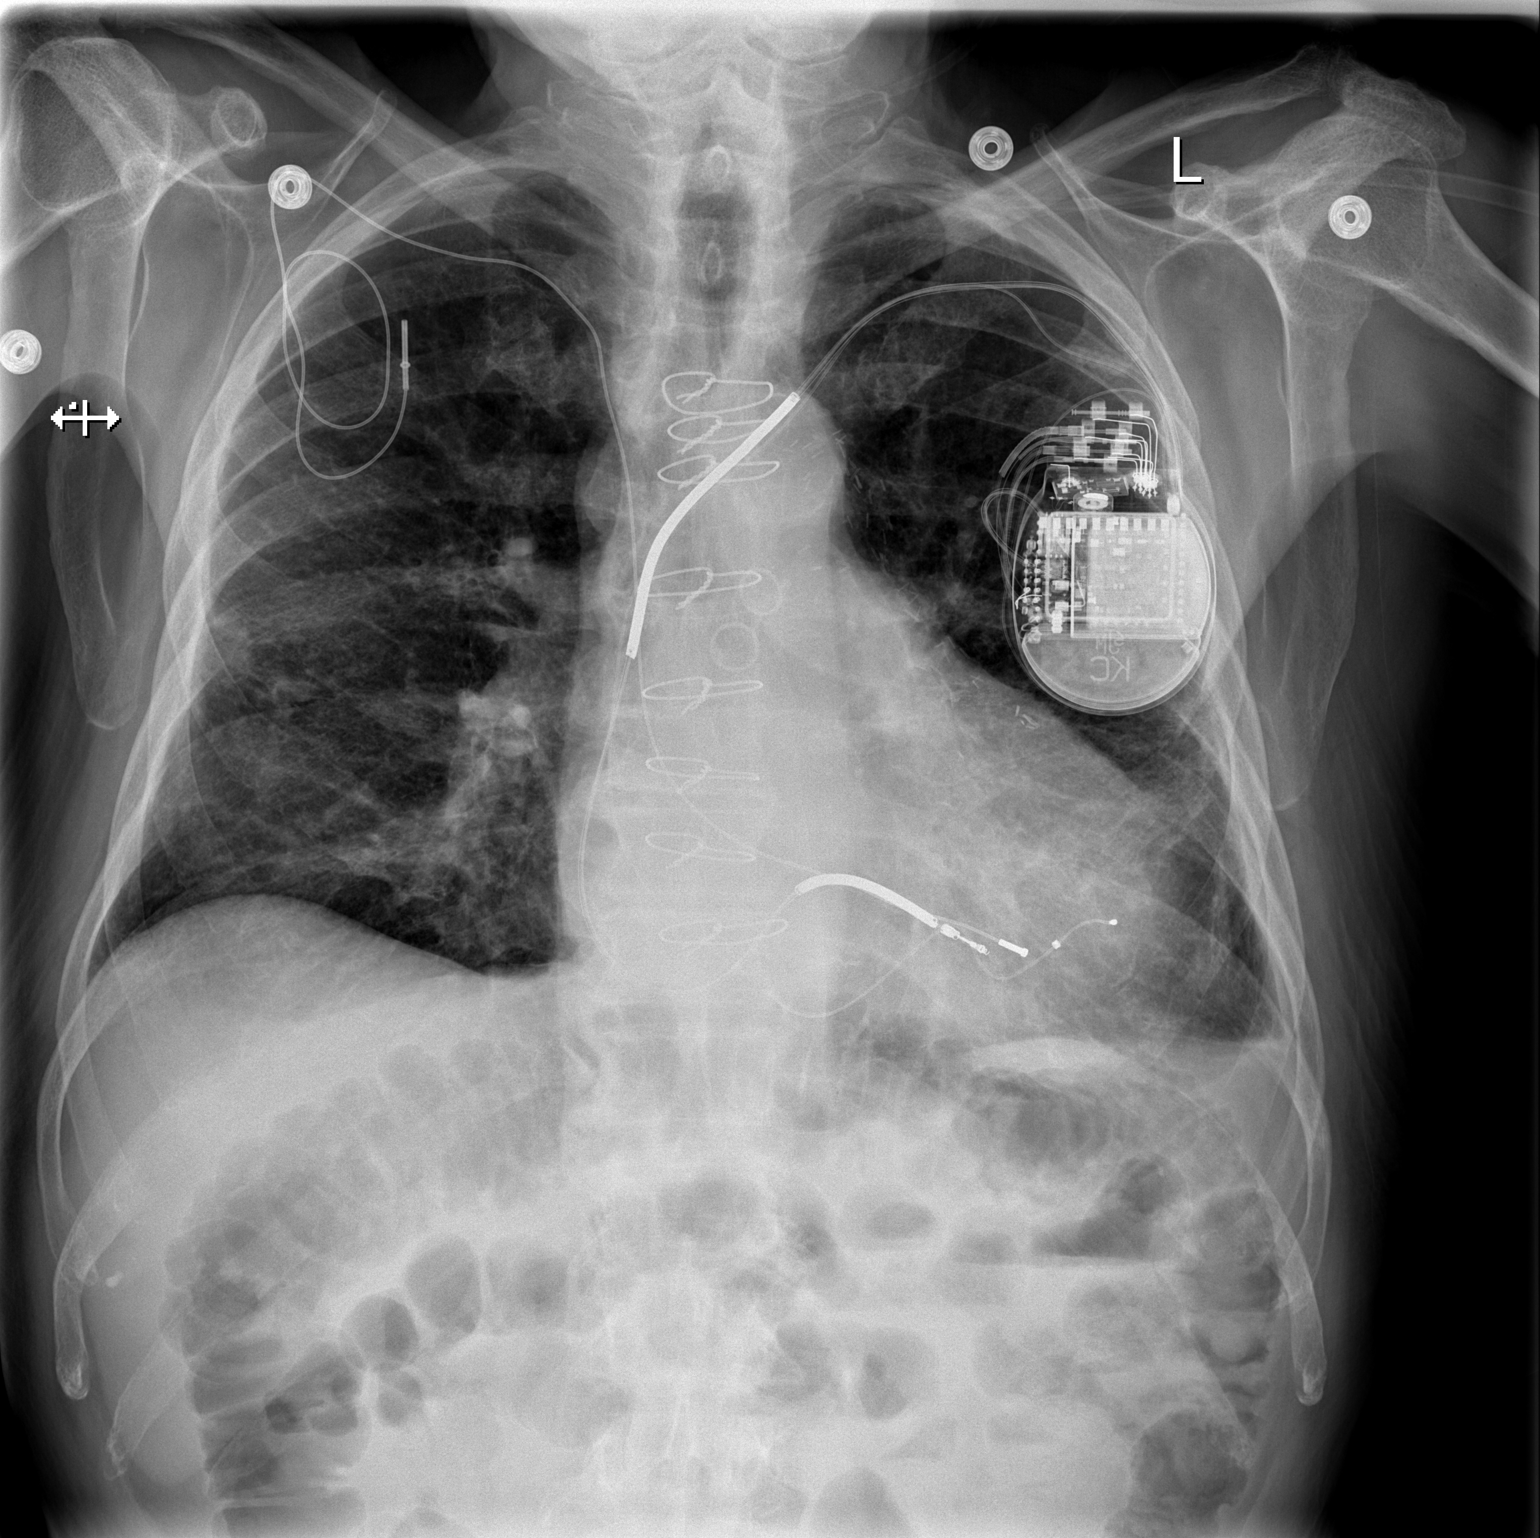

[w chest lat]
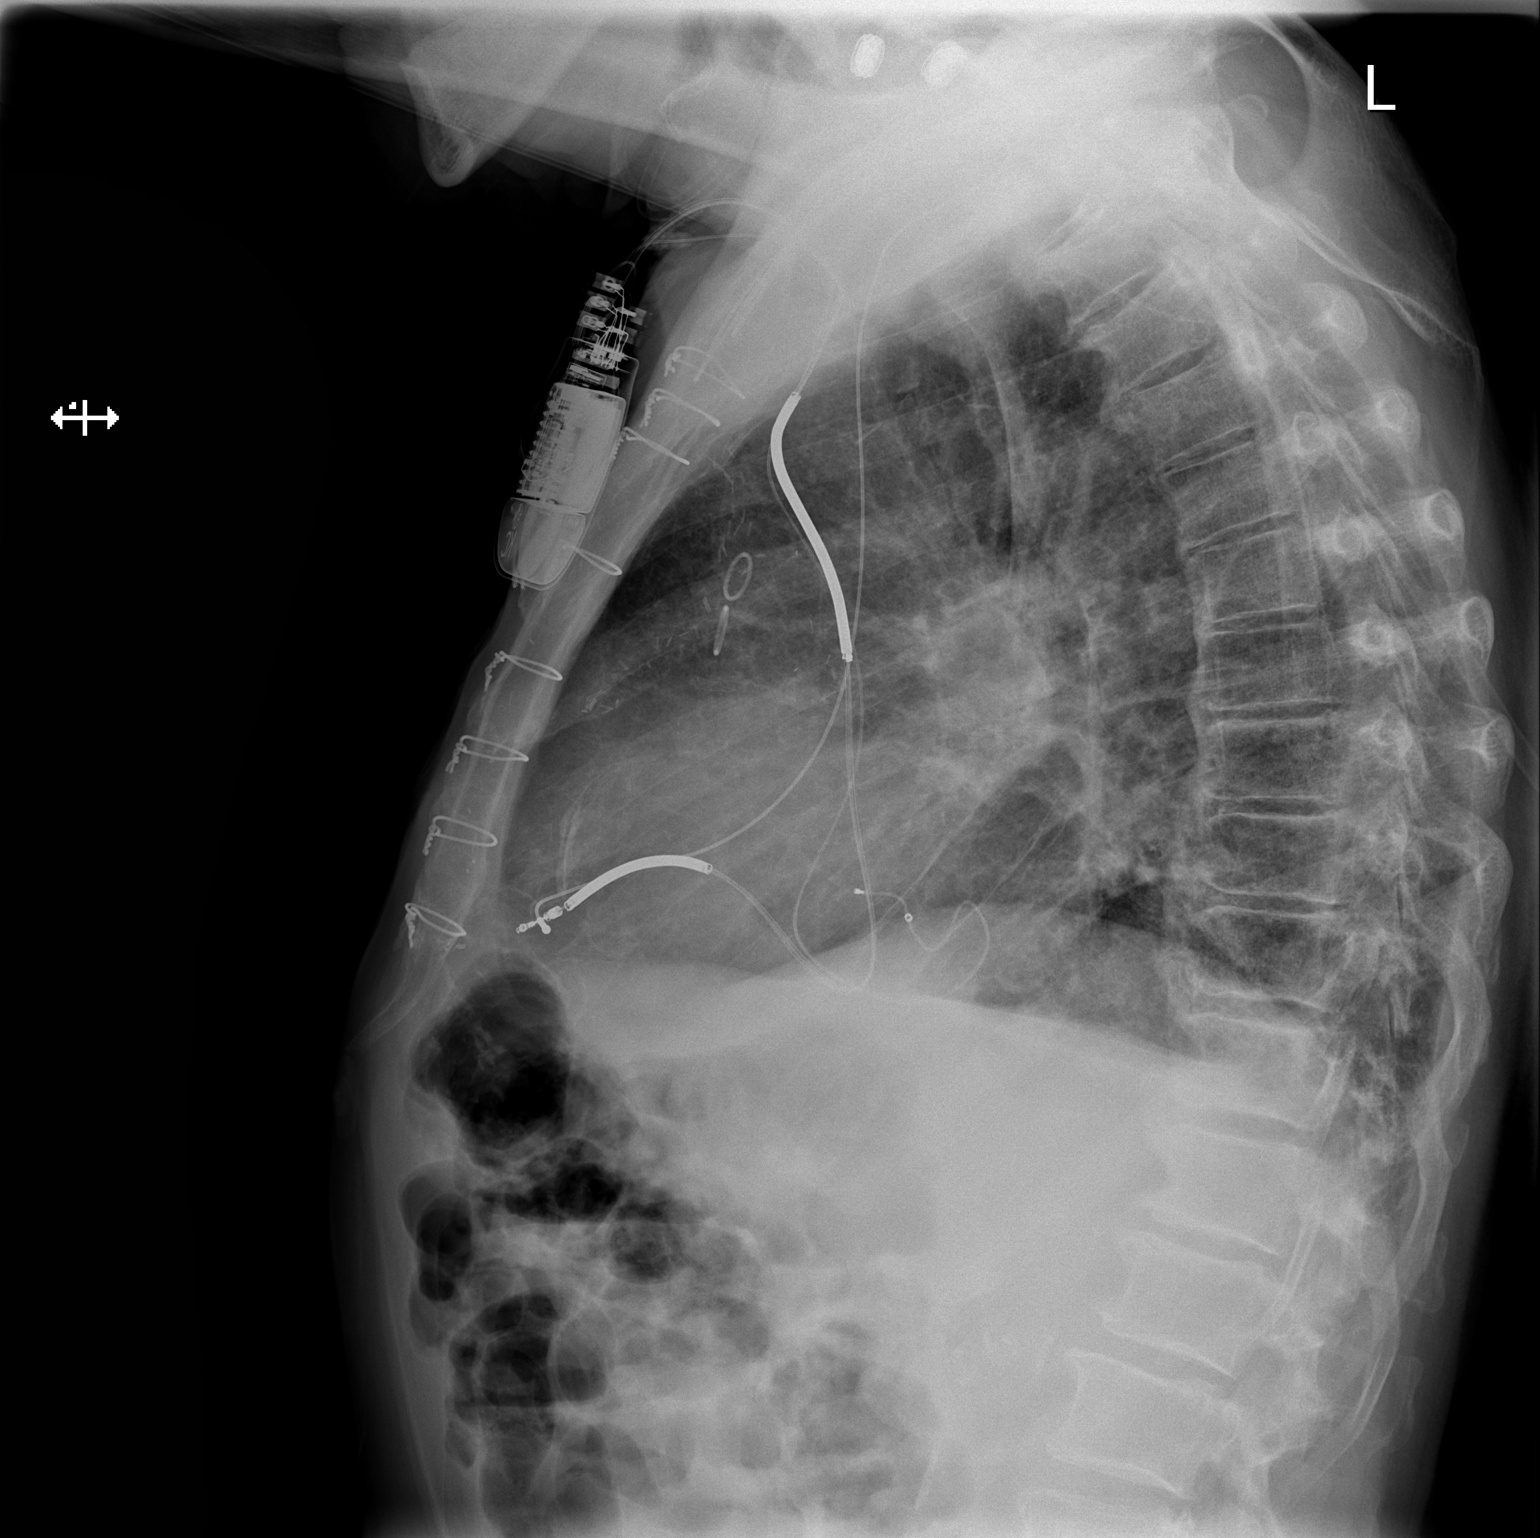

[2 of 2 positions shown; findings below may reference images not displayed]

FINDINGS: The pacer wires/ AICD are stable. The heart is enlarged but
unchanged. The mediastinal and hilar contours are within normal
limits and stable. Stable surgical changes related to bypass
surgery. There are chronic bronchitic type interstitial lung changes
and interstitial scarring. No infiltrates, edema or effusions. Mild
chronic pleural scarring changes on the left and associated
calcifications.
IMPRESSION: Chronic lung changes and mild cardiac enlargement but no definite
acute overlying pulmonary process.

## 2014-09-25 DIAGNOSIS — J449 Chronic obstructive pulmonary disease, unspecified: Secondary | ICD-10-CM | POA: Diagnosis not present

## 2014-10-05 ENCOUNTER — Encounter (HOSPITAL_COMMUNITY): Payer: Self-pay | Admitting: *Deleted

## 2014-10-05 ENCOUNTER — Emergency Department (HOSPITAL_COMMUNITY)
Admission: EM | Admit: 2014-10-05 | Discharge: 2014-10-05 | Disposition: A | Discharge status: Home / Self Care | Payer: Medicare Other | Attending: Emergency Medicine | Admitting: Emergency Medicine

## 2014-10-05 DIAGNOSIS — Y9389 Activity, other specified: Secondary | ICD-10-CM | POA: Diagnosis not present

## 2014-10-05 DIAGNOSIS — M1990 Unspecified osteoarthritis, unspecified site: Secondary | ICD-10-CM | POA: Insufficient documentation

## 2014-10-05 DIAGNOSIS — Y998 Other external cause status: Secondary | ICD-10-CM | POA: Diagnosis not present

## 2014-10-05 DIAGNOSIS — J449 Chronic obstructive pulmonary disease, unspecified: Secondary | ICD-10-CM | POA: Insufficient documentation

## 2014-10-05 DIAGNOSIS — Z79899 Other long term (current) drug therapy: Secondary | ICD-10-CM | POA: Diagnosis not present

## 2014-10-05 DIAGNOSIS — I5022 Chronic systolic (congestive) heart failure: Secondary | ICD-10-CM | POA: Insufficient documentation

## 2014-10-05 DIAGNOSIS — Z9861 Coronary angioplasty status: Secondary | ICD-10-CM | POA: Insufficient documentation

## 2014-10-05 DIAGNOSIS — I10 Essential (primary) hypertension: Secondary | ICD-10-CM | POA: Insufficient documentation

## 2014-10-05 DIAGNOSIS — Z87891 Personal history of nicotine dependence: Secondary | ICD-10-CM | POA: Diagnosis not present

## 2014-10-05 DIAGNOSIS — I2510 Atherosclerotic heart disease of native coronary artery without angina pectoris: Secondary | ICD-10-CM | POA: Diagnosis not present

## 2014-10-05 DIAGNOSIS — Z951 Presence of aortocoronary bypass graft: Secondary | ICD-10-CM | POA: Insufficient documentation

## 2014-10-05 DIAGNOSIS — Z8673 Personal history of transient ischemic attack (TIA), and cerebral infarction without residual deficits: Secondary | ICD-10-CM | POA: Insufficient documentation

## 2014-10-05 DIAGNOSIS — Z7952 Long term (current) use of systemic steroids: Secondary | ICD-10-CM | POA: Insufficient documentation

## 2014-10-05 DIAGNOSIS — E119 Type 2 diabetes mellitus without complications: Secondary | ICD-10-CM | POA: Insufficient documentation

## 2014-10-05 DIAGNOSIS — T63444A Toxic effect of venom of bees, undetermined, initial encounter: Secondary | ICD-10-CM

## 2014-10-05 DIAGNOSIS — Z9981 Dependence on supplemental oxygen: Secondary | ICD-10-CM | POA: Insufficient documentation

## 2014-10-05 DIAGNOSIS — Z950 Presence of cardiac pacemaker: Secondary | ICD-10-CM | POA: Diagnosis not present

## 2014-10-05 DIAGNOSIS — Z85118 Personal history of other malignant neoplasm of bronchus and lung: Secondary | ICD-10-CM | POA: Insufficient documentation

## 2014-10-05 DIAGNOSIS — Z8546 Personal history of malignant neoplasm of prostate: Secondary | ICD-10-CM | POA: Diagnosis not present

## 2014-10-05 DIAGNOSIS — Z7901 Long term (current) use of anticoagulants: Secondary | ICD-10-CM | POA: Insufficient documentation

## 2014-10-05 DIAGNOSIS — Z87448 Personal history of other diseases of urinary system: Secondary | ICD-10-CM | POA: Diagnosis not present

## 2014-10-05 DIAGNOSIS — Y9289 Other specified places as the place of occurrence of the external cause: Secondary | ICD-10-CM | POA: Insufficient documentation

## 2014-10-05 MED ORDER — HYDROCORTISONE 1 % EX CREA: TOPICAL_CREAM | CUTANEOUS | Status: DC

## 2014-10-05 NOTE — ED Provider Notes (Signed)
CSN: [id number]     Arrival date & time 10/05/14  1614 History   This chart was scribed for Alyse Low, PA-C working with Daleen Bo, MD by Mercy Moore, ED Scribe. This patient was seen in room TR03C/TR03C and the patient's care was started at 7:16 PM.   Chief Complaint  Patient presents with  . Insect Bite    The history is provided by the patient. No language interpreter was used.   HPI Comments: [name] is a 81 y.o. male who presents to the Emergency Department with bee stings to left wrist and left arm, approximately four hours ago. Patient reports redness and swelling to sites. No specific treatment at home.Patient states that he is allergic to bee stings and typically experiences swelling. Patient denies difficulty breathing, lip, or tongue swelling.   Past Medical History  Diagnosis Date  . Diabetes mellitus   . Hypertension   . Chronic systolic CHF (congestive heart failure)     a. 12/2012 Echo: EF 20-25%, mid-dist antsept AK, mod dil LA.  [name] CAD (coronary artery disease)     a. 07/2002 CABG x 3: LIMA->LAD, VG->Diag, VG->OM;  b. 06/2006 Cath: LM 50-60ost/p, LAD patent mid stent, D1 sev dzs, D2 patent stent, LCX nl, OM2 sev sten prox, RCA large/nl, VG->Diag nl, VG->OM nl, LIMA->LAD atretic, EF 30%.  . Ischemic cardiomyopathy     a. 06/2006 s/p SJM Promote RF, model 3207-36 BiV ICD, ser #: [id number].  [name] Hypercholesterolemia   . Black lung disease   . Nephrolithiasis 09/2000  . CVA (cerebral vascular accident)   . Prostate cancer 11/01/10    gleason 7, 8, 9, gold seeds 02/08/11  . Lung cancer   . COPD (chronic obstructive pulmonary disease)     a. On home O2.  [name] GERD (gastroesophageal reflux disease)   . Arthritis   . Gout   . Carotid artery occlusion    Past Surgical History  Procedure Laterality Date  . Colonoscopy    . Esophagogastroduodenoscopy  02/19/2011    Procedure: ESOPHAGOGASTRODUODENOSCOPY (EGD);  Surgeon: Jeryl Columbia, MD;  Location: [facility] ENDOSCOPY;   Service: Endoscopy;  Laterality: N/A;  . Upper endoscpopy    . Coronary angioplasty with stent placement  07/1997; 08/1997;03/1998  . Coronary artery bypass graft  07/2002    CABG X3  . Incision and drainage of wound  08/2002    right thigh; S/P EVH  . Insert / replace / remove pacemaker  1979; 1992; 01/2000;  . Insert / replace / remove pacemaker  09/2003; 06/2006    w/AICD  . Insert / replace / remove pacemaker  12/2004    pacmaker explant  . Shoulder arthroscopy w/ rotator cuff repair  05/2008    left  . Bi-ventricular pacemaker insertion (crt-p)  12-02-13    downgrade of previously implanted CRTD to STJ CRTP  . Biv pacemaker generator change out N/A 12/02/2013    Procedure: BIV PACEMAKER GENERATOR CHANGE OUT;  Surgeon: Evans Lance, MD;  Location: [facility] CATH LAB;  Service: Cardiovascular;  Laterality: N/A;   Family History  Problem Relation Age of Onset  . Alzheimer's disease Father 33  . Cancer Father 82    metastatic prostate cancer  . Diabetes Sister 80  . Diabetes Brother   . Diabetes Brother   . Diabetes Brother   . Hypotension Neg Hx   . Malignant hyperthermia Neg Hx   . Pseudochol deficiency Neg Hx    History  Substance Use Topics  .  Smoking status: Former Smoker    Types: Cigarettes    Quit date: 03/12/1949  . Smokeless tobacco: Current User    Types: Chew  . Alcohol Use: No     Comment: drank until per pt 03/19/11    Review of Systems  Constitutional: Negative for fever and chills.  HENT: Negative for trouble swallowing.   Respiratory: Negative for shortness of breath.   Skin: Positive for color change and wound.  All other systems reviewed and are negative.   Allergies  Aspirin  Home Medications   Prior to Admission medications   Medication Sig Start Date End Date Taking? Authorizing Provider  acetaminophen (TYLENOL) 500 MG tablet Take 500 mg by mouth every 6 (six) hours as needed (pain).    Historical Provider, MD  albuterol (PROVENTIL HFA;VENTOLIN HFA)  108 (90 BASE) MCG/ACT inhaler Inhale 2 puffs into the lungs every 4 (four) hours as needed for wheezing or shortness of breath.    Historical Provider, MD  albuterol (PROVENTIL) (2.5 MG/3ML) 0.083% nebulizer solution Take 2.5 mg by nebulization every 6 (six) hours as needed for wheezing or shortness of breath.    Historical Provider, MD  allopurinol (ZYLOPRIM) 300 MG tablet Take 300 mg by mouth daily.      Historical Provider, MD  CALCIUM-VITAMIN D PO Take 1 tablet by mouth 2 (two) times daily.     Historical Provider, MD  digoxin (LANOXIN) 0.125 MG tablet Take 125 mcg by mouth daily.      Historical Provider, MD  furosemide (LASIX) 20 MG tablet Take 20 mg by mouth daily.    Historical Provider, MD  isosorbide mononitrate (IMDUR) 30 MG 24 hr tablet Take 30 mg by mouth daily. 07/27/13   Historical Provider, MD  levofloxacin (LEVAQUIN) 750 MG tablet Take 1 tablet (750 mg total) by mouth daily. 04/17/14   Alvina Chou, PA-C  lisinopril (PRINIVIL,ZESTRIL) 10 MG tablet Take 10 mg by mouth daily.    Historical Provider, MD  metoprolol succinate (TOPROL-XL) 25 MG 24 hr tablet Take 25 mg by mouth daily. 07/27/13   Historical Provider, MD  nitroGLYCERIN (NITROSTAT) 0.4 MG SL tablet Place 0.4 mg under the tongue every 5 (five) minutes as needed for chest pain.    Historical Provider, MD  Omega-3 Fatty Acids (FISH OIL) 1000 MG CAPS Take 1,000 mg by mouth at bedtime.     Historical Provider, MD  omeprazole (PRILOSEC) 20 MG capsule Take 20 mg by mouth daily as needed (acid reflux).  01/12/14   Historical Provider, MD  predniSONE (DELTASONE) 20 MG tablet Take 2 tablets (40 mg total) by mouth daily. Take 40 mg by mouth daily for 3 days, then '20mg'$  by mouth daily for 3 days, then '10mg'$  daily for 3 days 04/17/14   Alvina Chou, PA-C  Rivaroxaban (XARELTO) 15 MG TABS tablet Take 15 mg by mouth at bedtime.     Historical Provider, MD  vitamin B-12 (CYANOCOBALAMIN) 1000 MCG tablet Take 1,000 mcg by mouth 2 (two) times  daily.    Historical Provider, MD   Triage Vitals: BP 133/61 mmHg  Pulse 60  Temp(Src) 97.9 F (36.6 C) (Oral)  Resp 18  SpO2 99% Physical Exam  Constitutional: He is oriented to person, place, and time. He appears well-developed and well-nourished. No distress.  HENT:  Head: Normocephalic and atraumatic.  Eyes: EOM are normal.  Neck: Neck supple. No tracheal deviation present.  Cardiovascular: Normal rate.   Pulmonary/Chest: Effort normal. No respiratory distress.  Musculoskeletal: Normal range of  motion.  Neurological: He is alert and oriented to person, place, and time.  Skin: Skin is warm and dry.  Left elbow and left wrist: 2cm red raised area.   Psychiatric: He has a normal mood and affect. His behavior is normal.  Nursing note and vitals reviewed.   ED Course  Procedures (including critical care time)  COORDINATION OF CARE: 7:19 PM- Discussed treatment plan with patient at bedside and patient agreed to plan.   Labs Review Labs Reviewed - No data to display  Imaging Review No results found.   EKG Interpretation None      MDM   Final diagnoses:  Bee sting, undetermined intent, initial encounter    Hydrocortisone topical avs    Fransico Meadow, PA-C 10/06/14 [id number]  Daleen Bo, MD 10/08/14 [phone]

## 2014-10-05 NOTE — Discharge Instructions (Signed)

## 2014-10-05 NOTE — ED Notes (Signed)
Pt states that he got stung by a bee twice, once to the left hand and once to the left arm. Some swelling and redness noted to the left hand. States he has an allergy to bees. Pt states that his normal allergic reaction is swelling.

## 2014-10-18 IMAGING — US US CAROTID DUPLEX BILAT
1 series · 13 of 24 positions shown · non-contrast
Comparison: January 18, 2012.

CLINICAL DATA: Syncope.

EXAM:
BILATERAL CAROTID DUPLEX ULTRASOUND
TECHNIQUE: Gray scale imaging, color Doppler and duplex ultrasound were
performed of bilateral carotid and vertebral arteries in the neck.

[Series 1: us carotid duplex bilat · 0.11mm/px · 13 of 70 slices shown]
[im 1/70]
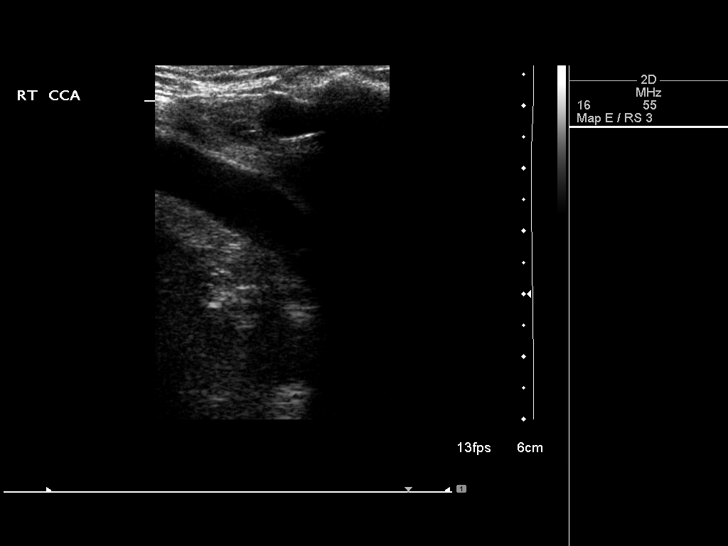
[im 7/70]
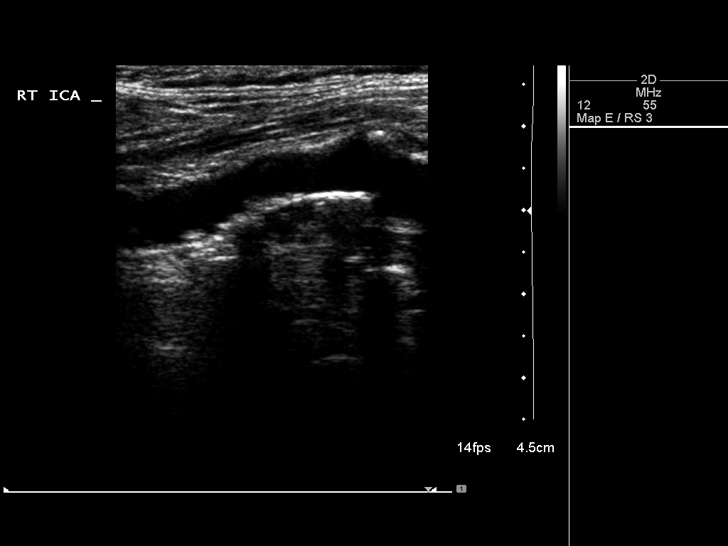
[im 13/70]
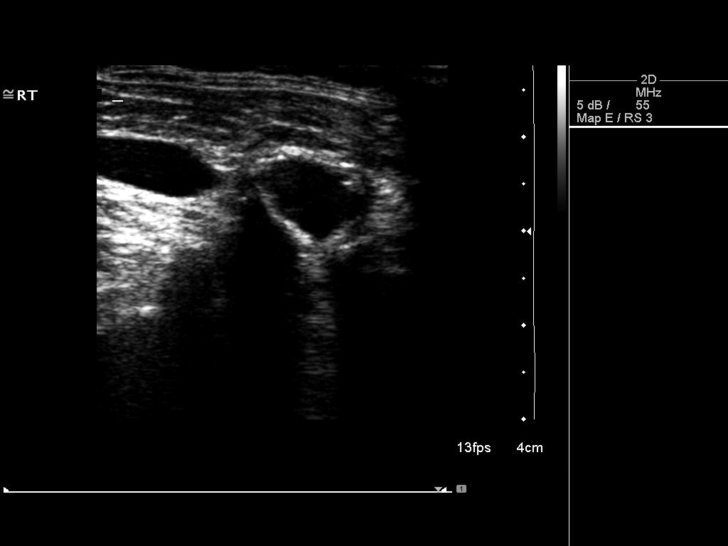
[im 19/70]
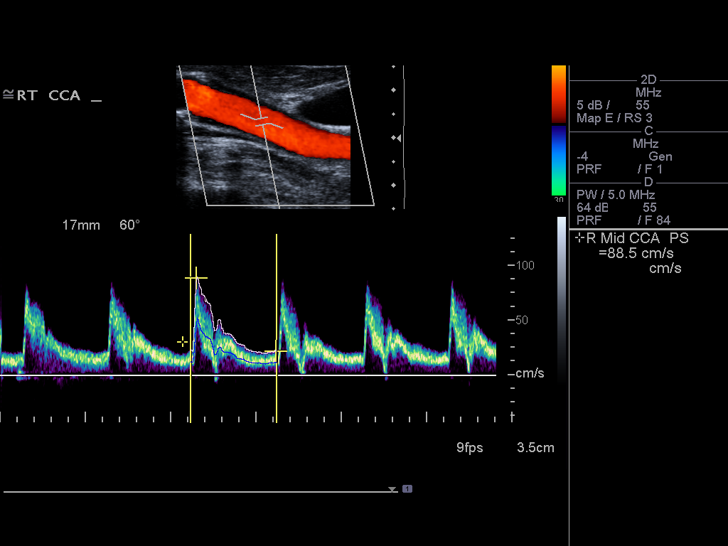
[im 25/70]
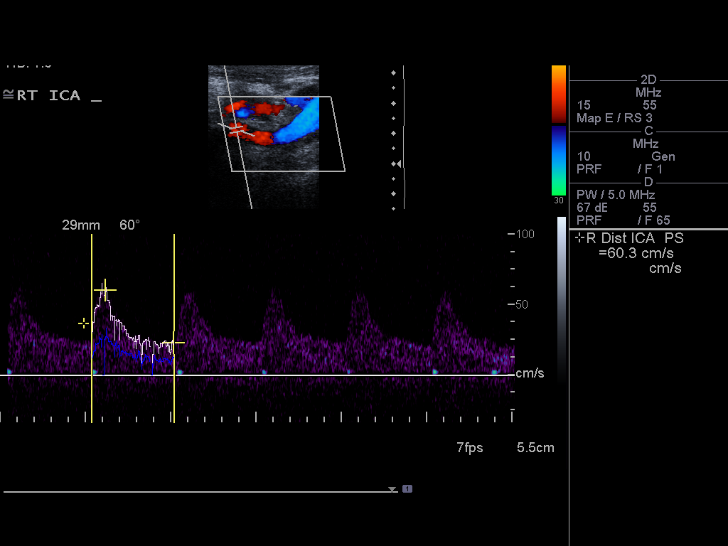
[im 31/70]
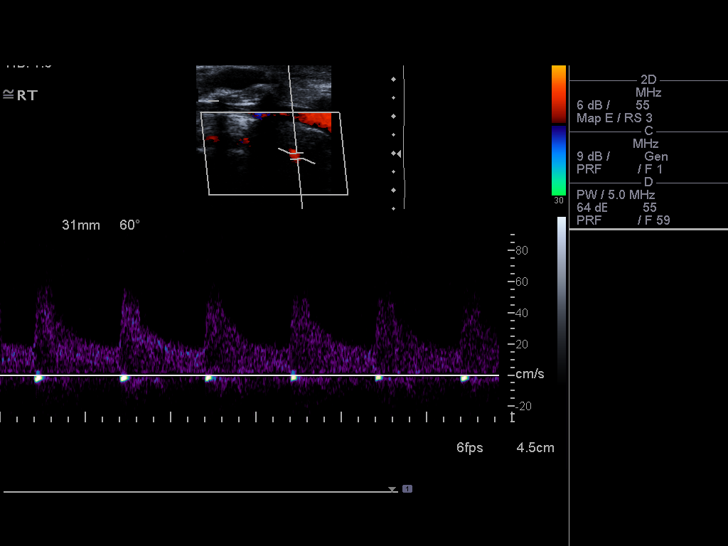
[im 37/70]
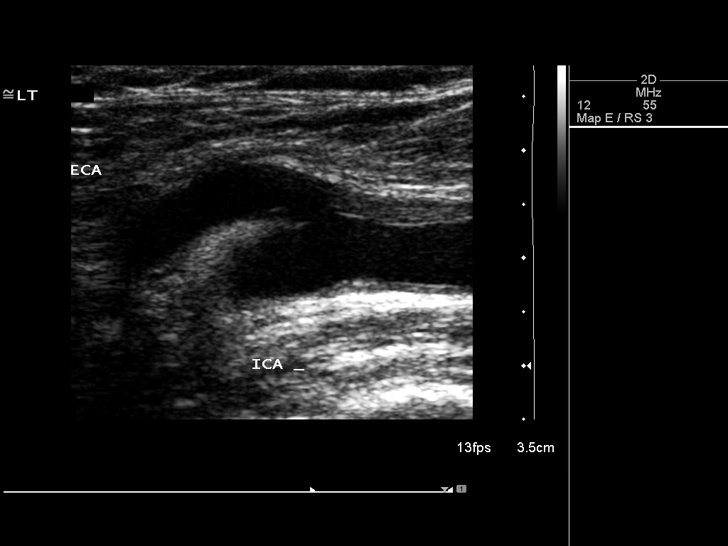
[im 40/70]
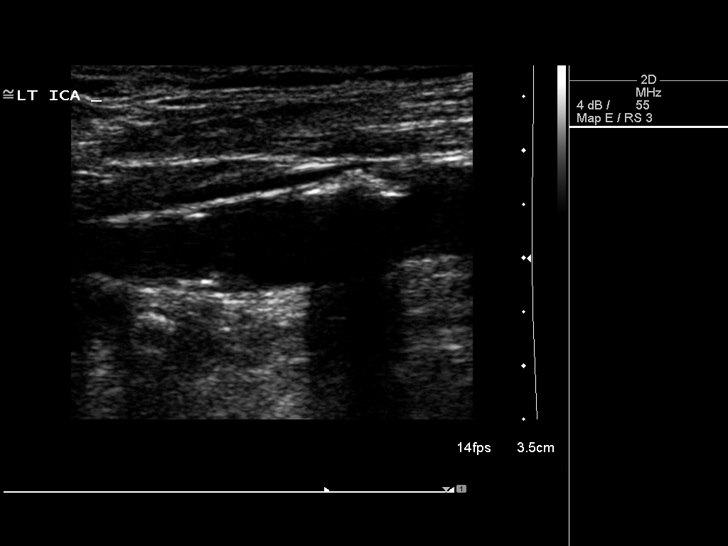
[im 46/70]
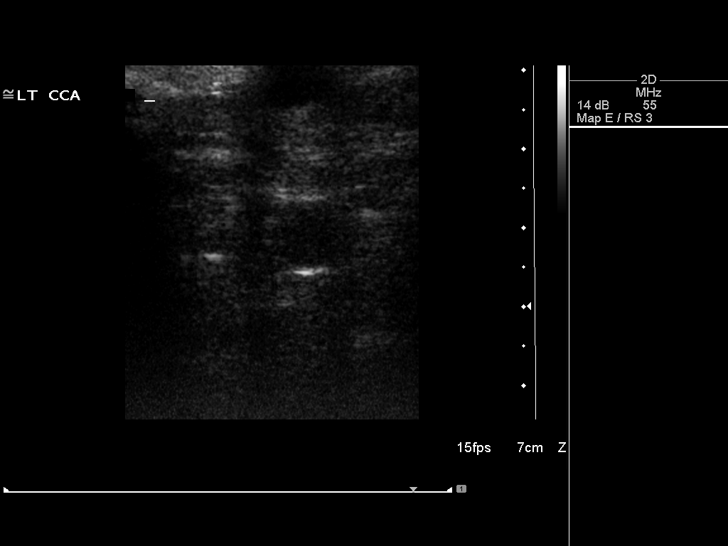
[im 52/70]
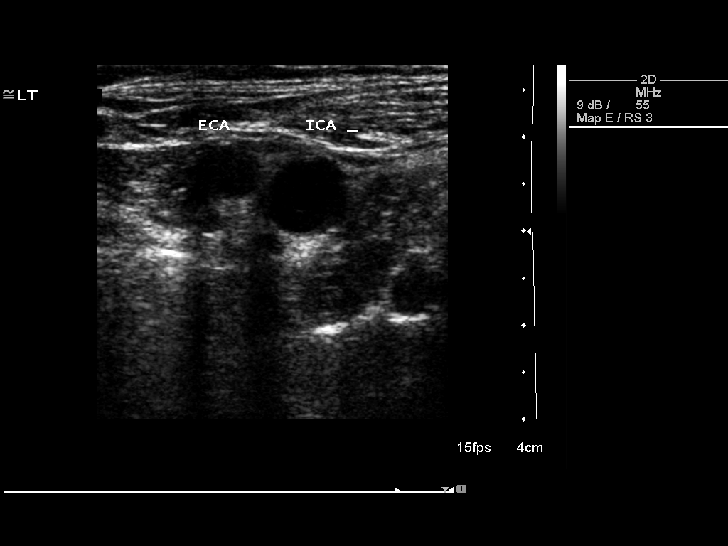
[im 58/70]
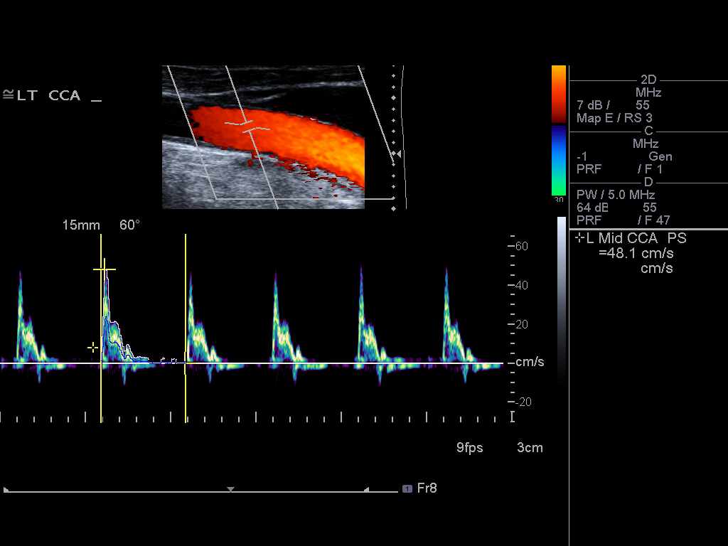
[im 64/70]
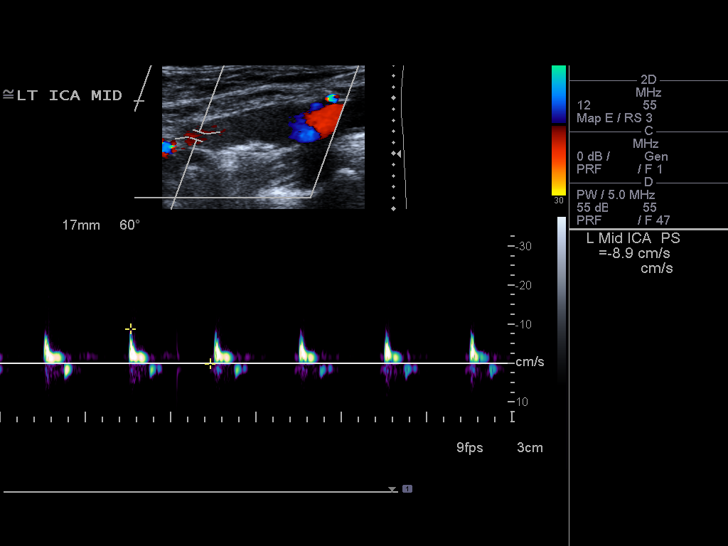
[im 70/70]
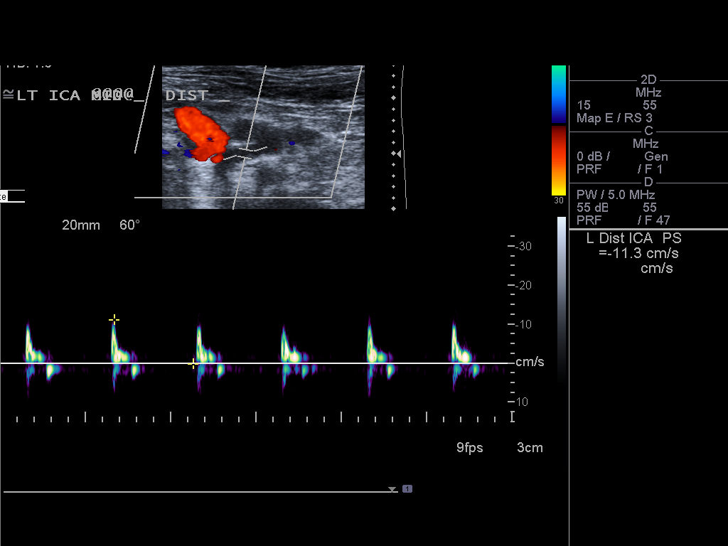

[13 of 24 positions shown; findings below may reference images not displayed]

FINDINGS: Criteria: Quantification of carotid stenosis is based on velocity
parameters that correlate the residual internal carotid diameter
with NASCET-based stenosis levels, using the diameter of the distal
internal carotid lumen as the denominator for stenosis measurement.

The following velocity measurements were obtained:

RIGHT

ICA:  98/33 cm/sec

CCA:  89/22 cm/sec

SYSTOLIC ICA/CCA RATIO:

DIASTOLIC ICA/CCA RATIO:

ECA:  118 cm/sec

LEFT

ICA:  36/0 cm/sec

CCA:  49/2 cm/sec

SYSTOLIC ICA/CCA RATIO:

DIASTOLIC ICA/CCA RATIO:  0

ECA:  93 cm/sec

RIGHT CAROTID ARTERY: Moderate calcified irregular plaque is noted
in the distal right common carotid artery and proximal right
internal carotid artery. The peak systolic velocity is significantly
decreased compared to prior exam, but the degree if plaque formation
suggests that this likely represent significant stenosis.

RIGHT VERTEBRAL ARTERY:  Antegrade flow is noted.

LEFT CAROTID ARTERY: Moderate irregular and calcified plaque
formation is noted in left carotid bulb and proximal left internal
carotid artery ; no definite diastolic flow is noted in the left
internal carotid artery suggesting severe stenosis distally or
potentially occlusion.

LEFT VERTEBRAL ARTERY:  Antegrade flow is noted.
IMPRESSION: Moderate calcified irregular plaque formation is noted in the distal
right common carotid artery and proximal right internal carotid
artery ; the peak systolic velocity is significantly decreased
compared to prior exam, but these ultrasound findings are strongly
suggestive of significant stenosis in the proximal right internal
carotid artery.

Moderate irregular calcified plaque is noted in the proximal left
internal carotid artery with no definite diagnostic flow seen on
Doppler; this is concerning for high-grade distal left internal
carotid artery stenosis or occlusion. Further evaluation with CT or
MR angiography is recommended.

## 2014-10-26 DIAGNOSIS — J449 Chronic obstructive pulmonary disease, unspecified: Secondary | ICD-10-CM | POA: Diagnosis not present

## 2014-11-09 ENCOUNTER — Encounter: Payer: Self-pay | Admitting: Internal Medicine

## 2014-11-09 ENCOUNTER — Ambulatory Visit (INDEPENDENT_AMBULATORY_CARE_PROVIDER_SITE_OTHER): Payer: Medicare Other | Admitting: Internal Medicine

## 2014-11-09 VITALS — BP 122/74 | HR 65 | Ht 66.0 in | Wt 141.2 lb

## 2014-11-09 DIAGNOSIS — Z950 Presence of cardiac pacemaker: Secondary | ICD-10-CM

## 2014-11-09 DIAGNOSIS — I482 Chronic atrial fibrillation, unspecified: Secondary | ICD-10-CM

## 2014-11-09 DIAGNOSIS — I5022 Chronic systolic (congestive) heart failure: Secondary | ICD-10-CM | POA: Diagnosis not present

## 2014-11-09 LAB — CUP PACEART INCLINIC DEVICE CHECK
Battery Remaining Longevity: 68.4 mo
Brady Statistic RA Percent Paced: 0 %
Brady Statistic RV Percent Paced: 99 %
Date Time Interrogation Session: 20160830143603
Lead Channel Impedance Value: 575 Ohm
Lead Channel Impedance Value: 625 Ohm
Lead Channel Pacing Threshold Amplitude: 0.5 V
Lead Channel Setting Pacing Amplitude: 2.5 V
Lead Channel Setting Pacing Amplitude: 2.875
Lead Channel Setting Pacing Pulse Width: 0.5 ms
Lead Channel Setting Pacing Pulse Width: 0.8 ms
Lead Channel Setting Sensing Sensitivity: 2 mV
MDC IDC MSMT BATTERY VOLTAGE: 2.99 V
MDC IDC MSMT LEADCHNL LV PACING THRESHOLD AMPLITUDE: 2 V
MDC IDC MSMT LEADCHNL LV PACING THRESHOLD PULSEWIDTH: 0.8 ms
MDC IDC MSMT LEADCHNL RV PACING THRESHOLD PULSEWIDTH: 0.5 ms
MDC IDC MSMT LEADCHNL RV SENSING INTR AMPL: 12 mV
Pulse Gen Serial Number: 7634311

## 2014-11-09 NOTE — Patient Instructions (Signed)
Medication Instructions:  Your physician recommends that you continue on your current medications as directed. Please refer to the Current Medication list given to you today.   Labwork: None ordered  Testing/Procedures: None ordered  Follow-Up: Your physician wants you to follow-up in: 12 months with Dr Taylor You will receive a reminder letter in the mail two months in advance. If you don't receive a letter, please call our office to schedule the follow-up appointment.  Remote monitoring is used to monitor your Pacemaker  from home. This monitoring reduces the number of office visits required to check your device to one time per year. It allows us to keep an eye on the functioning of your device to ensure it is working properly. You are scheduled for a device check from home on 02/08/15. You may send your transmission at any time that day. If you have a wireless device, the transmission will be sent automatically. After your physician reviews your transmission, you will receive a postcard with your next transmission date.    Any Other Special Instructions Will Be Listed Below (If Applicable).   

## 2014-11-09 NOTE — Assessment & Plan Note (Signed)
His ventricular rate is well controlled. No change in meds. He will continue Xarelto.

## 2014-11-09 NOTE — Assessment & Plan Note (Signed)
His [employer] BiV PM is working normally. Will recheck in several months.

## 2014-11-09 NOTE — Progress Notes (Signed)
HPI [name] returns today for followup. He is a 81 year old man with an ischemic cardiomyopathy, chronic systolic heart failure, symptomatic bradycardia, status post biventricular ICD implantation. He also has chronic atrial fibrillation. In the interim, the patient has been stable. He denies any recent ICD shock, chest pain, syncope, or peripheral edema. He has undergone change out of his old ICD to a biv ppm. He has done well.  Allergies  Allergen Reactions  . Aspirin Hives     Current Outpatient Prescriptions  Medication Sig Dispense Refill  . acetaminophen (TYLENOL) 500 MG tablet Take 500 mg by mouth every 6 (six) hours as needed (pain).    [name] albuterol (PROVENTIL HFA;VENTOLIN HFA) 108 (90 BASE) MCG/ACT inhaler Inhale 2 puffs into the lungs every 4 (four) hours as needed for wheezing or shortness of breath.    [name] albuterol (PROVENTIL) (2.5 MG/3ML) 0.083% nebulizer solution Take 2.5 mg by nebulization every 6 (six) hours as needed for wheezing or shortness of breath.    . allopurinol (ZYLOPRIM) 300 MG tablet Take 300 mg by mouth daily.      [name] CALCIUM-VITAMIN D PO Take 1 tablet by mouth 2 (two) times daily.     . digoxin (LANOXIN) 0.125 MG tablet Take 125 mcg by mouth daily.      . furosemide (LASIX) 20 MG tablet Take 20 mg by mouth daily.    . hydrocortisone cream 1 % Apply to affected area 2 times daily 15 g 0  . isosorbide mononitrate (IMDUR) 30 MG 24 hr tablet Take 30 mg by mouth daily.    [name] levofloxacin (LEVAQUIN) 750 MG tablet Take 1 tablet (750 mg total) by mouth daily. 7 tablet 0  . lisinopril (PRINIVIL,ZESTRIL) 10 MG tablet Take 10 mg by mouth daily.    . metoprolol succinate (TOPROL-XL) 25 MG 24 hr tablet Take 25 mg by mouth daily.    . nitroGLYCERIN (NITROSTAT) 0.4 MG SL tablet Place 0.4 mg under the tongue every 5 (five) minutes as needed for chest pain.    . Omega-3 Fatty Acids (FISH OIL) 1000 MG CAPS Take 1,000 mg by mouth at bedtime.     [name] omeprazole (PRILOSEC) 20 MG capsule  Take 20 mg by mouth daily as needed (acid reflux).   0  . predniSONE (DELTASONE) 20 MG tablet Take 2 tablets (40 mg total) by mouth daily. Take 40 mg by mouth daily for 3 days, then '20mg'$  by mouth daily for 3 days, then '10mg'$  daily for 3 days 12 tablet 0  . vitamin B-12 (CYANOCOBALAMIN) 1000 MCG tablet Take 1,000 mcg by mouth 2 (two) times daily.    Alveda Reasons 20 MG TABS tablet Take 20 mg by mouth daily.  0   No current facility-administered medications for this visit.     Past Medical History  Diagnosis Date  . Diabetes mellitus   . Hypertension   . Chronic systolic CHF (congestive heart failure)     a. 12/2012 Echo: EF 20-25%, mid-dist antsept AK, mod dil LA.  [name] CAD (coronary artery disease)     a. 07/2002 CABG x 3: LIMA->LAD, VG->Diag, VG->OM;  b. 06/2006 Cath: LM 50-60ost/p, LAD patent mid stent, D1 sev dzs, D2 patent stent, LCX nl, OM2 sev sten prox, RCA large/nl, VG->Diag nl, VG->OM nl, LIMA->LAD atretic, EF 30%.  . Ischemic cardiomyopathy     a. 06/2006 s/p SJM Promote RF, model 3207-36 BiV ICD, ser #: [id number].  [name] Hypercholesterolemia   . Black lung disease   . Nephrolithiasis  09/2000  . CVA (cerebral vascular accident)   . Prostate cancer 11/01/10    gleason 7, 8, 9, gold seeds 02/08/11  . Lung cancer   . COPD (chronic obstructive pulmonary disease)     a. On home O2.  [name] GERD (gastroesophageal reflux disease)   . Arthritis   . Gout   . Carotid artery occlusion     ROS:   All systems reviewed and negative except as noted in the HPI.   Past Surgical History  Procedure Laterality Date  . Colonoscopy    . Esophagogastroduodenoscopy  02/19/2011    Procedure: ESOPHAGOGASTRODUODENOSCOPY (EGD);  Surgeon: Jeryl Columbia, MD;  Location: [facility] ENDOSCOPY;  Service: Endoscopy;  Laterality: N/A;  . Upper endoscpopy    . Coronary angioplasty with stent placement  07/1997; 08/1997;03/1998  . Coronary artery bypass graft  07/2002    CABG X3  . Incision and drainage of wound  08/2002    right  thigh; S/P EVH  . Insert / replace / remove pacemaker  1979; 1992; 01/2000;  . Insert / replace / remove pacemaker  09/2003; 06/2006    w/AICD  . Insert / replace / remove pacemaker  12/2004    pacmaker explant  . Shoulder arthroscopy w/ rotator cuff repair  05/2008    left  . Bi-ventricular pacemaker insertion (crt-p)  12-02-13    downgrade of previously implanted CRTD to STJ CRTP  . Biv pacemaker generator change out N/A 12/02/2013    Procedure: BIV PACEMAKER GENERATOR CHANGE OUT;  Surgeon: Evans Lance, MD;  Location: [facility] CATH LAB;  Service: Cardiovascular;  Laterality: N/A;     Family History  Problem Relation Age of Onset  . Alzheimer's disease Father 55  . Cancer Father 38    metastatic prostate cancer  . Diabetes Sister 23  . Diabetes Brother   . Diabetes Brother   . Diabetes Brother   . Hypotension Neg Hx   . Malignant hyperthermia Neg Hx   . Pseudochol deficiency Neg Hx      Social History   Social History  . Marital Status: Widowed    Spouse Name: N/A  . Number of Children: N/A  . Years of Education: N/A   Occupational History  . retired     Equities trader   Social History Main Topics  . Smoking status: Former Smoker    Types: Cigarettes    Quit date: 03/12/1949  . Smokeless tobacco: Current User    Types: Chew  . Alcohol Use: No     Comment: drank until per pt 03/19/11  . Drug Use: No  . Sexual Activity: Yes   Other Topics Concern  . Not on file   Social History Narrative     BP 122/74 mmHg  Pulse 65  Ht '5\' 6"'$  (1.676 m)  Wt 141 lb 3.2 oz (64.048 kg)  BMI 22.80 kg/m2  Physical Exam:  Well appearing elderly man, NAD HEENT: Unremarkable Neck:  7 cm JVD, no thyromegally Lungs:  Clear with no wheezes, rales, or rhonchi. HEART:  IRegular rate rhythm, 2/6 systolic murmur, no rubs, no clicks Abd:  soft, positive bowel sounds, no organomegally, no rebound, no guarding Ext:  2 plus pulses, no edema, no cyanosis, no clubbing Skin:  No rashes no  nodules Neuro:  CN II through XII intact, motor grossly intact  DEVICE  Normal device function.  See PaceArt for details.   Assess/Plan:

## 2014-11-09 NOTE — Assessment & Plan Note (Signed)
He is class 2. He is well compensated. He will continue his current meds. I have asked him to maintain a low sodium diet.

## 2014-11-10 ENCOUNTER — Emergency Department (HOSPITAL_COMMUNITY)
Admission: EM | Admit: 2014-11-10 | Discharge: 2014-11-10 | Disposition: A | Discharge status: Home / Self Care | Payer: Medicare Other | Attending: Emergency Medicine | Admitting: Emergency Medicine

## 2014-11-10 ENCOUNTER — Emergency Department (HOSPITAL_COMMUNITY): Payer: Medicare Other

## 2014-11-10 ENCOUNTER — Encounter (HOSPITAL_COMMUNITY): Payer: Self-pay | Admitting: Family Medicine

## 2014-11-10 DIAGNOSIS — Z87442 Personal history of urinary calculi: Secondary | ICD-10-CM | POA: Insufficient documentation

## 2014-11-10 DIAGNOSIS — N200 Calculus of kidney: Secondary | ICD-10-CM | POA: Diagnosis not present

## 2014-11-10 DIAGNOSIS — E119 Type 2 diabetes mellitus without complications: Secondary | ICD-10-CM | POA: Insufficient documentation

## 2014-11-10 DIAGNOSIS — M109 Gout, unspecified: Secondary | ICD-10-CM | POA: Diagnosis not present

## 2014-11-10 DIAGNOSIS — I2510 Atherosclerotic heart disease of native coronary artery without angina pectoris: Secondary | ICD-10-CM | POA: Insufficient documentation

## 2014-11-10 DIAGNOSIS — R112 Nausea with vomiting, unspecified: Secondary | ICD-10-CM | POA: Diagnosis not present

## 2014-11-10 DIAGNOSIS — K567 Ileus, unspecified: Secondary | ICD-10-CM | POA: Diagnosis not present

## 2014-11-10 DIAGNOSIS — Z85118 Personal history of other malignant neoplasm of bronchus and lung: Secondary | ICD-10-CM | POA: Diagnosis not present

## 2014-11-10 DIAGNOSIS — I5022 Chronic systolic (congestive) heart failure: Secondary | ICD-10-CM | POA: Diagnosis not present

## 2014-11-10 DIAGNOSIS — R918 Other nonspecific abnormal finding of lung field: Secondary | ICD-10-CM | POA: Diagnosis not present

## 2014-11-10 DIAGNOSIS — Z79899 Other long term (current) drug therapy: Secondary | ICD-10-CM | POA: Insufficient documentation

## 2014-11-10 DIAGNOSIS — Z8546 Personal history of malignant neoplasm of prostate: Secondary | ICD-10-CM | POA: Insufficient documentation

## 2014-11-10 DIAGNOSIS — K219 Gastro-esophageal reflux disease without esophagitis: Secondary | ICD-10-CM | POA: Insufficient documentation

## 2014-11-10 DIAGNOSIS — Z87891 Personal history of nicotine dependence: Secondary | ICD-10-CM | POA: Diagnosis not present

## 2014-11-10 DIAGNOSIS — I10 Essential (primary) hypertension: Secondary | ICD-10-CM | POA: Insufficient documentation

## 2014-11-10 DIAGNOSIS — R1010 Upper abdominal pain, unspecified: Secondary | ICD-10-CM | POA: Diagnosis not present

## 2014-11-10 DIAGNOSIS — J449 Chronic obstructive pulmonary disease, unspecified: Secondary | ICD-10-CM | POA: Insufficient documentation

## 2014-11-10 LAB — URINALYSIS, ROUTINE W REFLEX MICROSCOPIC
Bilirubin Urine: NEGATIVE
GLUCOSE, UA: NEGATIVE mg/dL
HGB URINE DIPSTICK: NEGATIVE
Ketones, ur: NEGATIVE mg/dL
LEUKOCYTES UA: NEGATIVE
Nitrite: NEGATIVE
PH: 7 (ref 5.0–8.0)
Protein, ur: 30 mg/dL — AB
SPECIFIC GRAVITY, URINE: 1.025 (ref 1.005–1.030)
Urobilinogen, UA: 0.2 mg/dL (ref 0.0–1.0)

## 2014-11-10 LAB — COMPREHENSIVE METABOLIC PANEL
ALBUMIN: 4 g/dL (ref 3.5–5.0)
ALK PHOS: 75 U/L (ref 38–126)
ALT: 23 U/L (ref 17–63)
ANION GAP: 10 (ref 5–15)
AST: 27 U/L (ref 15–41)
BUN: 20 mg/dL (ref 6–20)
CHLORIDE: 98 mmol/L — AB (ref 101–111)
CO2: 28 mmol/L (ref 22–32)
Calcium: 10.2 mg/dL (ref 8.9–10.3)
Creatinine, Ser: 1.13 mg/dL (ref 0.61–1.24)
GFR calc non Af Amer: 59 mL/min — ABNORMAL LOW (ref >60)
Glucose, Bld: 113 mg/dL — ABNORMAL HIGH (ref 65–99)
Potassium: 4.2 mmol/L (ref 3.5–5.1)
SODIUM: 136 mmol/L (ref 135–145)
Total Bilirubin: 1.4 mg/dL — ABNORMAL HIGH (ref 0.3–1.2)
Total Protein: 7.8 g/dL (ref 6.5–8.1)

## 2014-11-10 LAB — CBC
HCT: 32.2 % — ABNORMAL LOW (ref 39.0–52.0)
HEMOGLOBIN: 10.6 g/dL — AB (ref 13.0–17.0)
MCH: 31.3 pg (ref 26.0–34.0)
MCHC: 32.9 g/dL (ref 30.0–36.0)
MCV: 95 fL (ref 78.0–100.0)
Platelets: 150 10*3/uL (ref 150–400)
RBC: 3.39 MIL/uL — AB (ref 4.22–5.81)
RDW: 15.4 % (ref 11.5–15.5)
WBC: 3.8 10*3/uL — ABNORMAL LOW (ref 4.0–10.5)

## 2014-11-10 LAB — URINE MICROSCOPIC-ADD ON

## 2014-11-10 LAB — LIPASE, BLOOD: LIPASE: 36 U/L (ref 22–51)

## 2014-11-10 MED ORDER — IOHEXOL 300 MG/ML  SOLN
25.0000 mL | Freq: Once | INTRAMUSCULAR | Status: AC | PRN
  Administered 2014-11-10: 25 mL via ORAL

## 2014-11-10 MED ORDER — IOHEXOL 300 MG/ML  SOLN
100.0000 mL | Freq: Once | INTRAMUSCULAR | Status: AC | PRN
  Administered 2014-11-10: 100 mL via INTRAVENOUS

## 2014-11-10 MED ORDER — ONDANSETRON HCL 4 MG/2ML IJ SOLN: 4.0000 mg | Freq: Once | INTRAMUSCULAR | Status: DC

## 2014-11-10 NOTE — ED Provider Notes (Signed)
Pt signed out to me to follow up results of the CT scan. Pt had abdominal pain.  8:03 PM CT scan shows ileus. Pt had a BM in the ER, and feels a lot better. He has had no nausea and emesis, and has passed oral challenge. Results discussed. Clear liquid diet encouraged. Return precautions discussed verbally.   Varney Biles, MD 11/10/14 2005

## 2014-11-10 NOTE — ED Notes (Signed)
Unable to obtain IV access, another RN to attempt.

## 2014-11-10 NOTE — ED Notes (Signed)
Pt here for RUQ pain/right rib pain. sts started this am. sts hurts with breathing and moving. sts some nausea. Denies injury to area.

## 2014-11-10 NOTE — ED Provider Notes (Signed)
1 (CSN: [id number]     Arrival date & time 11/10/14  1358 History   First MD Initiated Contact with Patient 11/10/14 1524     Chief Complaint  Patient presents with  . Abdominal Pain     (Consider location/radiation/quality/duration/timing/severity/associated sxs/prior Treatment) Patient is a 81 y.o. male presenting with abdominal pain. The history is provided by the patient.  Abdominal Pain Associated symptoms: fatigue and nausea   Associated symptoms: no chest pain, no diarrhea, no fever, no shortness of breath and no vomiting    patient woke with upper abdominal pain and nausea this morning. States he has a decreased appetite. No fevers. No dysuria. States he feels kind of bad all over. He has not been hungry. No diarrhea or constipation. Pain is dull in his upper abdomen. No chest pain.  Past Medical History  Diagnosis Date  . Diabetes mellitus   . Hypertension   . Chronic systolic CHF (congestive heart failure)     a. 12/2012 Echo: EF 20-25%, mid-dist antsept AK, mod dil LA.  [name] CAD (coronary artery disease)     a. 07/2002 CABG x 3: LIMA->LAD, VG->Diag, VG->OM;  b. 06/2006 Cath: LM 50-60ost/p, LAD patent mid stent, D1 sev dzs, D2 patent stent, LCX nl, OM2 sev sten prox, RCA large/nl, VG->Diag nl, VG->OM nl, LIMA->LAD atretic, EF 30%.  . Ischemic cardiomyopathy     a. 06/2006 s/p SJM Promote RF, model 3207-36 BiV ICD, ser #: [id number].  [name] Hypercholesterolemia   . Black lung disease   . Nephrolithiasis 09/2000  . CVA (cerebral vascular accident)   . Prostate cancer 11/01/10    gleason 7, 8, 9, gold seeds 02/08/11  . Lung cancer   . COPD (chronic obstructive pulmonary disease)     a. On home O2.  [name] GERD (gastroesophageal reflux disease)   . Arthritis   . Gout   . Carotid artery occlusion    Past Surgical History  Procedure Laterality Date  . Colonoscopy    . Esophagogastroduodenoscopy  02/19/2011    Procedure: ESOPHAGOGASTRODUODENOSCOPY (EGD);  Surgeon: Jeryl Columbia, MD;   Location: [facility] ENDOSCOPY;  Service: Endoscopy;  Laterality: N/A;  . Upper endoscpopy    . Coronary angioplasty with stent placement  07/1997; 08/1997;03/1998  . Coronary artery bypass graft  07/2002    CABG X3  . Incision and drainage of wound  08/2002    right thigh; S/P EVH  . Insert / replace / remove pacemaker  1979; 1992; 01/2000;  . Insert / replace / remove pacemaker  09/2003; 06/2006    w/AICD  . Insert / replace / remove pacemaker  12/2004    pacmaker explant  . Shoulder arthroscopy w/ rotator cuff repair  05/2008    left  . Bi-ventricular pacemaker insertion (crt-p)  12-02-13    downgrade of previously implanted CRTD to STJ CRTP  . Biv pacemaker generator change out N/A 12/02/2013    Procedure: BIV PACEMAKER GENERATOR CHANGE OUT;  Surgeon: Evans Lance, MD;  Location: [facility] CATH LAB;  Service: Cardiovascular;  Laterality: N/A;   Family History  Problem Relation Age of Onset  . Alzheimer's disease Father 90  . Cancer Father 75    metastatic prostate cancer  . Diabetes Sister 53  . Diabetes Brother   . Diabetes Brother   . Diabetes Brother   . Hypotension Neg Hx   . Malignant hyperthermia Neg Hx   . Pseudochol deficiency Neg Hx    Social History  Substance Use Topics  .  Smoking status: Former Smoker    Types: Cigarettes    Quit date: 03/12/1949  . Smokeless tobacco: Current User    Types: Chew  . Alcohol Use: No     Comment: drank until per pt 03/19/11    Review of Systems  Constitutional: Positive for appetite change and fatigue. Negative for fever and activity change.  Eyes: Negative for pain.  Respiratory: Negative for chest tightness and shortness of breath.   Cardiovascular: Negative for chest pain and leg swelling.  Gastrointestinal: Positive for nausea and abdominal pain. Negative for vomiting and diarrhea.  Genitourinary: Negative for flank pain.  Musculoskeletal: Negative for back pain and neck stiffness.  Skin: Negative for rash.  Neurological: Negative for  weakness, numbness and headaches.  Psychiatric/Behavioral: Negative for behavioral problems.      Allergies  Aspirin  Home Medications   Prior to Admission medications   Medication Sig Start Date End Date Taking? Authorizing Provider  acetaminophen (TYLENOL) 500 MG tablet Take 500 mg by mouth every 6 (six) hours as needed (pain).   Yes Historical Provider, MD  albuterol (PROVENTIL HFA;VENTOLIN HFA) 108 (90 BASE) MCG/ACT inhaler Inhale 2 puffs into the lungs every 4 (four) hours as needed for wheezing or shortness of breath.   Yes Historical Provider, MD  albuterol (PROVENTIL) (2.5 MG/3ML) 0.083% nebulizer solution Take 2.5 mg by nebulization every 6 (six) hours as needed for wheezing or shortness of breath.   Yes Historical Provider, MD  allopurinol (ZYLOPRIM) 300 MG tablet Take 300 mg by mouth daily.     Yes Historical Provider, MD  bismuth subsalicylate (PEPTO BISMOL) 262 MG/15ML suspension Take 30 mLs by mouth every 6 (six) hours as needed for indigestion.   Yes Historical Provider, MD  CALCIUM-VITAMIN D PO Take 1 tablet by mouth 2 (two) times daily.    Yes Historical Provider, MD  digoxin (LANOXIN) 0.125 MG tablet Take 125 mcg by mouth daily.     Yes Historical Provider, MD  furosemide (LASIX) 20 MG tablet Take 20 mg by mouth daily.   Yes Historical Provider, MD  hydrocortisone cream 1 % Apply to affected area 2 times daily Patient taking differently: Apply 1 application topically 2 (two) times daily as needed for itching. Apply to affected area 2 times daily 10/05/14  Yes Hollace Kinnier Sofia, PA-C  isosorbide mononitrate (IMDUR) 30 MG 24 hr tablet Take 30 mg by mouth daily. 07/27/13  Yes Historical Provider, MD  lisinopril (PRINIVIL,ZESTRIL) 10 MG tablet Take 10 mg by mouth daily.   Yes Historical Provider, MD  metoprolol succinate (TOPROL-XL) 25 MG 24 hr tablet Take 25 mg by mouth daily. 07/27/13  Yes Historical Provider, MD  nitroGLYCERIN (NITROSTAT) 0.4 MG SL tablet Place 0.4 mg under the  tongue every 5 (five) minutes as needed for chest pain.   Yes Historical Provider, MD  Omega-3 Fatty Acids (FISH OIL) 1000 MG CAPS Take 1,000 mg by mouth at bedtime.    Yes Historical Provider, MD  omeprazole (PRILOSEC) 20 MG capsule Take 20 mg by mouth daily as needed (acid reflux).  01/12/14  Yes Historical Provider, MD  vitamin B-12 (CYANOCOBALAMIN) 1000 MCG tablet Take 1,000 mcg by mouth 2 (two) times daily.   Yes Historical Provider, MD  XARELTO 20 MG TABS tablet Take 20 mg by mouth daily. 10/29/14  Yes Historical Provider, MD  levofloxacin (LEVAQUIN) 750 MG tablet Take 1 tablet (750 mg total) by mouth daily. Patient not taking: Reported on 11/10/2014 04/17/14   Alvina Chou, PA-C  predniSONE (DELTASONE) 20 MG tablet Take 2 tablets (40 mg total) by mouth daily. Take 40 mg by mouth daily for 3 days, then '20mg'$  by mouth daily for 3 days, then '10mg'$  daily for 3 days Patient not taking: Reported on 11/10/2014 04/17/14   Kaitlyn Szekalski, PA-C   BP 161/68 mmHg  Pulse 53  Temp(Src) 97.9 F (36.6 C) (Oral)  Resp 16  SpO2 100% Physical Exam  Constitutional: He is oriented to person, place, and time. He appears well-developed and well-nourished.  HENT:  Head: Normocephalic and atraumatic.  Eyes: EOM are normal. Pupils are equal, round, and reactive to light.  Neck: Normal range of motion. Neck supple.  Cardiovascular: Normal rate, regular rhythm and normal heart sounds.   No murmur heard. Pulmonary/Chest: Effort normal and breath sounds normal.  Abdominal: Soft. He exhibits distension. He exhibits no mass. There is no tenderness. There is no rebound and no guarding.  Upper abdominal tenderness. Some distention. No hernias palpated.  Musculoskeletal: Normal range of motion. He exhibits no edema.  Neurological: He is alert and oriented to person, place, and time. No cranial nerve deficit.  Skin: Skin is warm and dry.  Psychiatric: He has a normal mood and affect.  Nursing note and vitals  reviewed.   ED Course  Procedures (including critical care time) Labs Review Labs Reviewed  COMPREHENSIVE METABOLIC PANEL - Abnormal; Notable for the following:    Chloride 98 (*)    Glucose, Bld 113 (*)    Total Bilirubin 1.4 (*)    GFR calc non Af Amer 59 (*)    All other components within normal limits  CBC - Abnormal; Notable for the following:    WBC 3.8 (*)    RBC 3.39 (*)    Hemoglobin 10.6 (*)    HCT 32.2 (*)    All other components within normal limits  LIPASE, BLOOD  URINALYSIS, ROUTINE W REFLEX MICROSCOPIC (NOT AT [facility])    Imaging Review Dg Ribs Unilateral W/chest Right  11/10/2014   CLINICAL DATA:  Chest pain and shortness of breath since this morning.  EXAM: RIGHT RIBS AND CHEST - 3+ VIEW  COMPARISON:  Chest x-ray 11/05/2014.  FINDINGS: The heart is enlarged but stable. Stable surgical changes from bypass surgery. The pacer wires and AICD are stable. The lungs demonstrate chronic bronchitic type interstitial changes and scarring. No definite infiltrates or effusions. Stable right lower lobe scarring changes. a No pneumothorax.  Dedicated views of the right ribs do not demonstrate any definite acute right-sided rib fractures.  Air-filled small bowel loops are noted in the abdomen with air-fluid levels which could suggest a small bowel obstruction. Recommend clinical correlation.  IMPRESSION: Chronic lung changes and stable cardiac enlargement. No definite acute pulmonary findings.  Air-filled small bowel loops in the upper abdomen with air-fluid levels may suggest a small bowel obstruction.  No definite acute right-sided rib fractures.   Electronically Signed   By: Marijo Sanes M.D.   On: 11/10/2014 15:02   I have personally reviewed and evaluated these images and lab results as part of my medical decision-making.   EKG Interpretation None      MDM   Final diagnoses:  None     Patient presents with nausea vomiting  and upper abdominal pain.  x-ray showed possible  obstruction. Will get CT.  Davonna Belling, MD 11/10/14 [phone]

## 2014-11-10 NOTE — ED Notes (Signed)
Patient transported to CT 

## 2014-11-10 NOTE — Discharge Instructions (Signed)
CT shows some slow transit of bowel content. No obstruction seen - but you need to be careful with the diet the next few days and have more liquid diet and green vegetables and fruits.   Ileus The intestine (bowel, or gut) is a long, muscular tube connecting your stomach to your rectum. If the intestine stops working, food cannot pass through. This is called an ileus. This can happen for a variety of reasons. Ileus is a major medical problem that usually requires hospitalization. If your intestine stops working because of a blockage, this is called a bowel obstruction and is a different condition. CAUSES   Surgery in your abdomen. This can last from a few hours to a few days.  An infection or inflammation in the belly (abdomen). This includes inflammation of the lining of the abdomen (peritonitis).  Infection or inflammation in other parts of the body, such as pneumonia or pancreatitis.  Passage of gallstones or kidney stones.  Damage to the nerves or blood vessels which go to the bowel.  Imbalance in the salts in the blood (electrolytes).  Injury to the brain and/or spinal cord.  Medications. Many medications can cause ileus or make it worse. The most common of these are strong pain medications. SYMPTOMS  Symptoms of bowel obstruction come from the bowel inactivity. They may include:  Bloating. Your belly gets bigger (distension).  Pain or discomfort in the abdomen.  Poor appetite, feeling sick to your stomach (nausea), and vomiting.  You may also not be able to hear your normal bowel sounds, such as "growling" in your stomach. DIAGNOSIS   Your history and a physical exam will usually suggest to your caregiver that you have an ileus.  X-rays or a CT scan of your abdomen will confirm the diagnosis. X-rays, CT scans, and lab tests may also suggest the cause. TREATMENT   Rest the intestine until it starts working again. This is most often accomplished by:  Stopping intake of  oral food and drink. Dehydration is prevented by using IV (intravenous) fluids.  Sometimes, a nasogastric tube (NG tube) is needed. This is a narrow plastic tube inserted through your nose and into your stomach. It is connected to suction to keep the stomach emptied out. This also helps treat the nausea and vomiting.  If there is an imbalance in the electrolytes, they are corrected with supplements in your intravenous fluids.  Medications that might make an ileus worse might be stopped.  There are no medications that reliably treat ileus, though your caregiver may suggest a trial of certain medications.  If your condition is slow to resolve, you will be reevaluated to be sure another condition, such as a blockage, is not present. Ileus is common and usually has a good outcome. Depending on the cause of your ileus, it usually can be treated by your caregivers with good results. Sometimes, specialists (surgeons or gastroenterologists) are asked to assist in your care.  HOME CARE INSTRUCTIONS   Follow your caregiver's instructions regarding diet and fluid intake. This will usually include drinking plenty of clear fluids, avoiding alcohol and caffeine, and eating a gentle diet.  Follow your caregiver's instructions regarding activity. A period of rest is sometimes advised before returning to work or school.  Take only medications prescribed by your caregiver. Be especially careful with narcotic pain medication, which can slow your bowel activity and contribute to ileus.  Keep any follow-up appointments with your caregiver or specialists. SEEK MEDICAL CARE IF:   You have  a recurrence of nausea, vomiting, or abdominal discomfort.  You develop fever of more than 102 F (38.9 C). SEEK IMMEDIATE MEDICAL CARE IF:   You have severe abdominal pain.  You are unable to keep fluids down. Document Released: 03/01/2003 Document Revised: 07/13/2013 Document Reviewed: 07/01/2008 [facility] Patient  Information 2015 [place], [place]. This information is not intended to replace advice given to you by your health care provider. Make sure you discuss any questions you have with your health care provider.

## 2014-11-26 DIAGNOSIS — J449 Chronic obstructive pulmonary disease, unspecified: Secondary | ICD-10-CM | POA: Diagnosis not present

## 2014-12-14 DIAGNOSIS — C61 Malignant neoplasm of prostate: Secondary | ICD-10-CM | POA: Diagnosis not present

## 2014-12-14 DIAGNOSIS — N419 Inflammatory disease of prostate, unspecified: Secondary | ICD-10-CM | POA: Diagnosis not present

## 2014-12-15 DIAGNOSIS — E119 Type 2 diabetes mellitus without complications: Secondary | ICD-10-CM | POA: Diagnosis not present

## 2014-12-21 DIAGNOSIS — J449 Chronic obstructive pulmonary disease, unspecified: Secondary | ICD-10-CM | POA: Diagnosis not present

## 2014-12-23 DIAGNOSIS — E119 Type 2 diabetes mellitus without complications: Secondary | ICD-10-CM | POA: Diagnosis not present

## 2014-12-23 DIAGNOSIS — I4891 Unspecified atrial fibrillation: Secondary | ICD-10-CM | POA: Diagnosis not present

## 2014-12-23 DIAGNOSIS — I10 Essential (primary) hypertension: Secondary | ICD-10-CM | POA: Diagnosis not present

## 2014-12-23 DIAGNOSIS — I509 Heart failure, unspecified: Secondary | ICD-10-CM | POA: Diagnosis not present

## 2014-12-26 DIAGNOSIS — J449 Chronic obstructive pulmonary disease, unspecified: Secondary | ICD-10-CM | POA: Diagnosis not present

## 2014-12-29 ENCOUNTER — Encounter: Payer: Self-pay | Admitting: Internal Medicine

## 2014-12-29 DIAGNOSIS — E119 Type 2 diabetes mellitus without complications: Secondary | ICD-10-CM | POA: Diagnosis not present

## 2014-12-29 DIAGNOSIS — Z23 Encounter for immunization: Secondary | ICD-10-CM | POA: Diagnosis not present

## 2014-12-29 DIAGNOSIS — I480 Paroxysmal atrial fibrillation: Secondary | ICD-10-CM | POA: Diagnosis not present

## 2014-12-29 DIAGNOSIS — I5020 Unspecified systolic (congestive) heart failure: Secondary | ICD-10-CM | POA: Diagnosis not present

## 2014-12-29 DIAGNOSIS — I10 Essential (primary) hypertension: Secondary | ICD-10-CM | POA: Diagnosis not present

## 2014-12-30 DIAGNOSIS — C61 Malignant neoplasm of prostate: Secondary | ICD-10-CM | POA: Diagnosis not present

## 2014-12-30 DIAGNOSIS — R972 Elevated prostate specific antigen [PSA]: Secondary | ICD-10-CM | POA: Diagnosis not present

## 2014-12-30 DIAGNOSIS — R3915 Urgency of urination: Secondary | ICD-10-CM | POA: Diagnosis not present

## 2015-01-03 IMAGING — CT CT ANGIO NECK
1 of 8 series · 3 of 33 positions shown · IV contrast ([ID] OMNI [id number])
Comparison: Carotid Doppler ultrasound 03/19/2013. Cervical spine
CT 02/15/2013. Chest CTA 06/30/2012. Head CT 10/29/2008.

CLINICAL DATA: 80-year-old male preoperative study for surgical
planning. Carotid stenosis or occlusion. History of syncope. Initial
encounter.

EXAM:
CT ANGIOGRAPHY NECK
TECHNIQUE: Multidetector CT imaging of the neck was performed using the
standard protocol during bolus administration of intravenous
contrast. Multiplanar CT image reconstructions and MIPs were
obtained to evaluate the vascular anatomy. Carotid stenosis
measurements (when applicable) are obtained utilizing NASCET
criteria, using the distal internal carotid diameter as the
denominator.
CONTRAST:  100mL OMNIPAQUE IOHEXOL 350 MG/ML SOLN

[Series 8: cta neck · axial · 0.39mm/px · z∈[+12,+260]mm · 3 of 99 slices shown]
[im 1/99  soft-tissue]
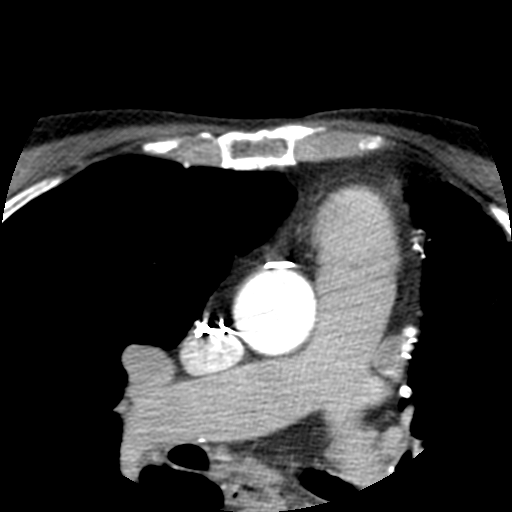
[im 50/99  bone]
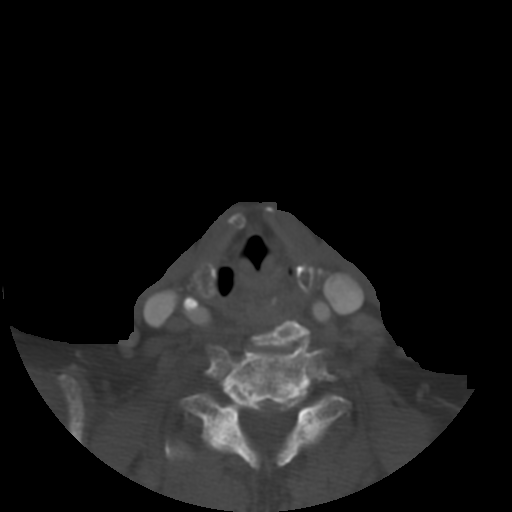
[im 99/99  soft-tissue]
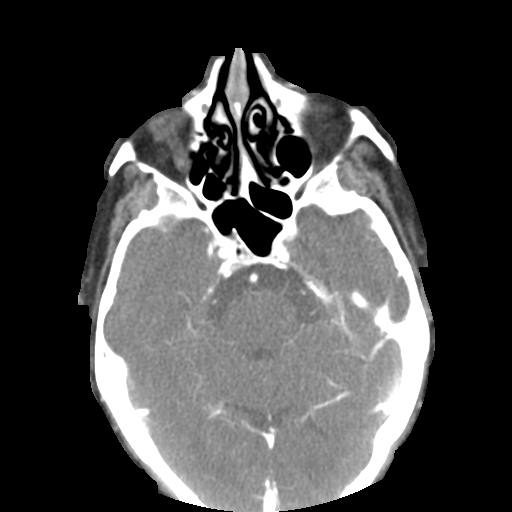

[3 of 33 positions shown; findings below may reference images not displayed]

FINDINGS: Left chest cardiac AICD. Right chest disconnected cardiac lead.
Partially visible abnormal right lung opacity, both nodular and
platelike (series 9, image 1). The most nodular component measures
12 mm and is subpleural. The more indistinct, platelike mostly
ground-glass opacity measures 26 mm. These are stable since
06/30/2012. No superior mediastinal lymphadenopathy. Sequela of
median sternotomy. Stable visualized thorax osseous structures.

Negative thyroid, larynx, pharynx, parapharyngeal spaces,
retropharyngeal space, sublingual space, and submandibular glands.
There is a chronic coarsely calcified left parotid space nodule
measuring up to 14 mm diameter and unchanged since 1717. Unchanged
probable venous confluence in the right parotid space near the
submandibular vein.

Grossly stable visualized brain parenchyma. Evidence of a chronic
lacunar infarct right cerebellum.

Stable mastoid sclerosis greater on the left. Negative visible
paranasal sinuses. Absent dentition. Degenerative changes in the
cervical spine. No acute osseous abnormality identified. No cervical
lymphadenopathy.

VASCULAR FINDINGS:

Streak artifact from the AICD lead. Three vessel arch configuration.
Mild ectasia of the visualized thoracic aorta. Mild calcified plaque
in the arch. No great vessel origin stenosis.

Normal right CCA origin. Calcified plaque in the anterior right CCA
throughout the neck not resulting in stenosis. At the right carotid
bifurcation there is soft and calcified plaque. Soft plaque
continues in the posterior right ICA bulb. Subsequent stenosis is
less than 50 % with respect to the distal vessel (series 604, image
68). Tortuous distal cervical right ICA. Visible right ICA siphon is
widely patent.

Calcified plaque but no proximal right subclavian artery stenosis.
Calcified plaque at the right vertebral artery origin resulting an
Mild to moderate stenosis. Tortuous proximal right vertebral.
Otherwise negative cervical right vertebral artery. Negative
intracranial right vertebral artery with normal right PICA origin
and normal vertebrobasilar junction. Negative visible basilar
artery.

No left CCA origin stenosis. Soft plaque in the distal left CCA. At
the left carotid bifurcation there is mild calcified plaque. The
left ICA origin and proximal bulb are widely patent. However, 14 mm
distal to the left ICA origin the vessel abruptly severely narrows
(series 702, image 174), and occludes at the level of the angle of
the mandible. The occlusion has a highly tapered appearance. The
left ICA remains occluded into the proximal left ICA siphon. Unclear
if it is being reconstituted in the cavernous left ICA.

No proximal left subclavian artery stenosis despite soft and
calcified plaque. Normal left vertebral artery origin. Tortuous left
V1 segment. Otherwise negative left vertebral artery. Slightly early
takeoff of the left PICA a just as the vessel crosses the dura.
Normal left PICA and left vertebrobasilar junction.

Review of the MIP images confirms the above findings.
IMPRESSION: 1. Occlusion of the cervical left ICA is confirmed at the angle of
the mandible, with a highly tapered narrowing of the vessel
beginning 14 mm distal to its origin (see series 604, images
132-134). This appearance is most suggestive of a remote arterial
dissection.
2. Soft and calcified plaque at the right carotid bifurcation but no
significant right ICA stenosis in the neck. Tortuous right ICA.
3. Mild to moderate right vertebral artery origin stenosis.
Otherwise negative, codominant vertebral arteries.
4. Stable right lung nodular and platelike opacity since June 2012.
Chronic coarsely calcified left parotid space nodule is unchanged
since 1717, benign.

## 2015-01-21 DIAGNOSIS — J449 Chronic obstructive pulmonary disease, unspecified: Secondary | ICD-10-CM | POA: Diagnosis not present

## 2015-01-26 DIAGNOSIS — J449 Chronic obstructive pulmonary disease, unspecified: Secondary | ICD-10-CM | POA: Diagnosis not present

## 2015-02-08 ENCOUNTER — Ambulatory Visit (INDEPENDENT_AMBULATORY_CARE_PROVIDER_SITE_OTHER): Payer: Medicare Other | Admitting: *Deleted

## 2015-02-08 DIAGNOSIS — I482 Chronic atrial fibrillation, unspecified: Secondary | ICD-10-CM

## 2015-02-09 NOTE — Progress Notes (Signed)
Remote pacemaker transmission.   

## 2015-02-17 LAB — CUP PACEART REMOTE DEVICE CHECK
Battery Remaining Longevity: 74 mo
Battery Remaining Percentage: 95.5 %
Implantable Lead Implant Date: 20050706
Implantable Lead Location: 753860
Lead Channel Impedance Value: 540 Ohm
Lead Channel Impedance Value: 660 Ohm
Lead Channel Pacing Threshold Pulse Width: 0.8 ms
Lead Channel Sensing Intrinsic Amplitude: 6.8 mV
Lead Channel Setting Pacing Amplitude: 2.5 V
Lead Channel Setting Pacing Pulse Width: 0.5 ms
MDC IDC LEAD IMPLANT DT: 20080423
MDC IDC LEAD LOCATION: 753858
MDC IDC LEAD MODEL: 1158
MDC IDC MSMT BATTERY VOLTAGE: 2.99 V
MDC IDC MSMT LEADCHNL LV PACING THRESHOLD AMPLITUDE: 2.125 V
MDC IDC MSMT LEADCHNL RV PACING THRESHOLD AMPLITUDE: 0.5 V
MDC IDC MSMT LEADCHNL RV PACING THRESHOLD PULSEWIDTH: 0.5 ms
MDC IDC SESS DTM: 20161129080722
MDC IDC SET LEADCHNL LV PACING AMPLITUDE: 3.125
MDC IDC SET LEADCHNL LV PACING PULSEWIDTH: 0.8 ms
MDC IDC SET LEADCHNL RV SENSING SENSITIVITY: 2 mV
Pulse Gen Serial Number: 7634311

## 2015-02-18 ENCOUNTER — Encounter: Payer: Self-pay | Admitting: Cardiology

## 2015-02-20 DIAGNOSIS — J449 Chronic obstructive pulmonary disease, unspecified: Secondary | ICD-10-CM | POA: Diagnosis not present

## 2015-03-03 DIAGNOSIS — I482 Chronic atrial fibrillation: Secondary | ICD-10-CM | POA: Diagnosis not present

## 2015-03-03 DIAGNOSIS — I10 Essential (primary) hypertension: Secondary | ICD-10-CM | POA: Diagnosis not present

## 2015-03-07 IMAGING — CR DG CHEST 2V
2 series · 2 of 2 positions shown · non-contrast
Comparison: Chest radiograph performed 02/15/2013

CLINICAL DATA: Cough and shortness of breath.  Chest pain.

EXAM:
CHEST  2 VIEW

[w chest pa]
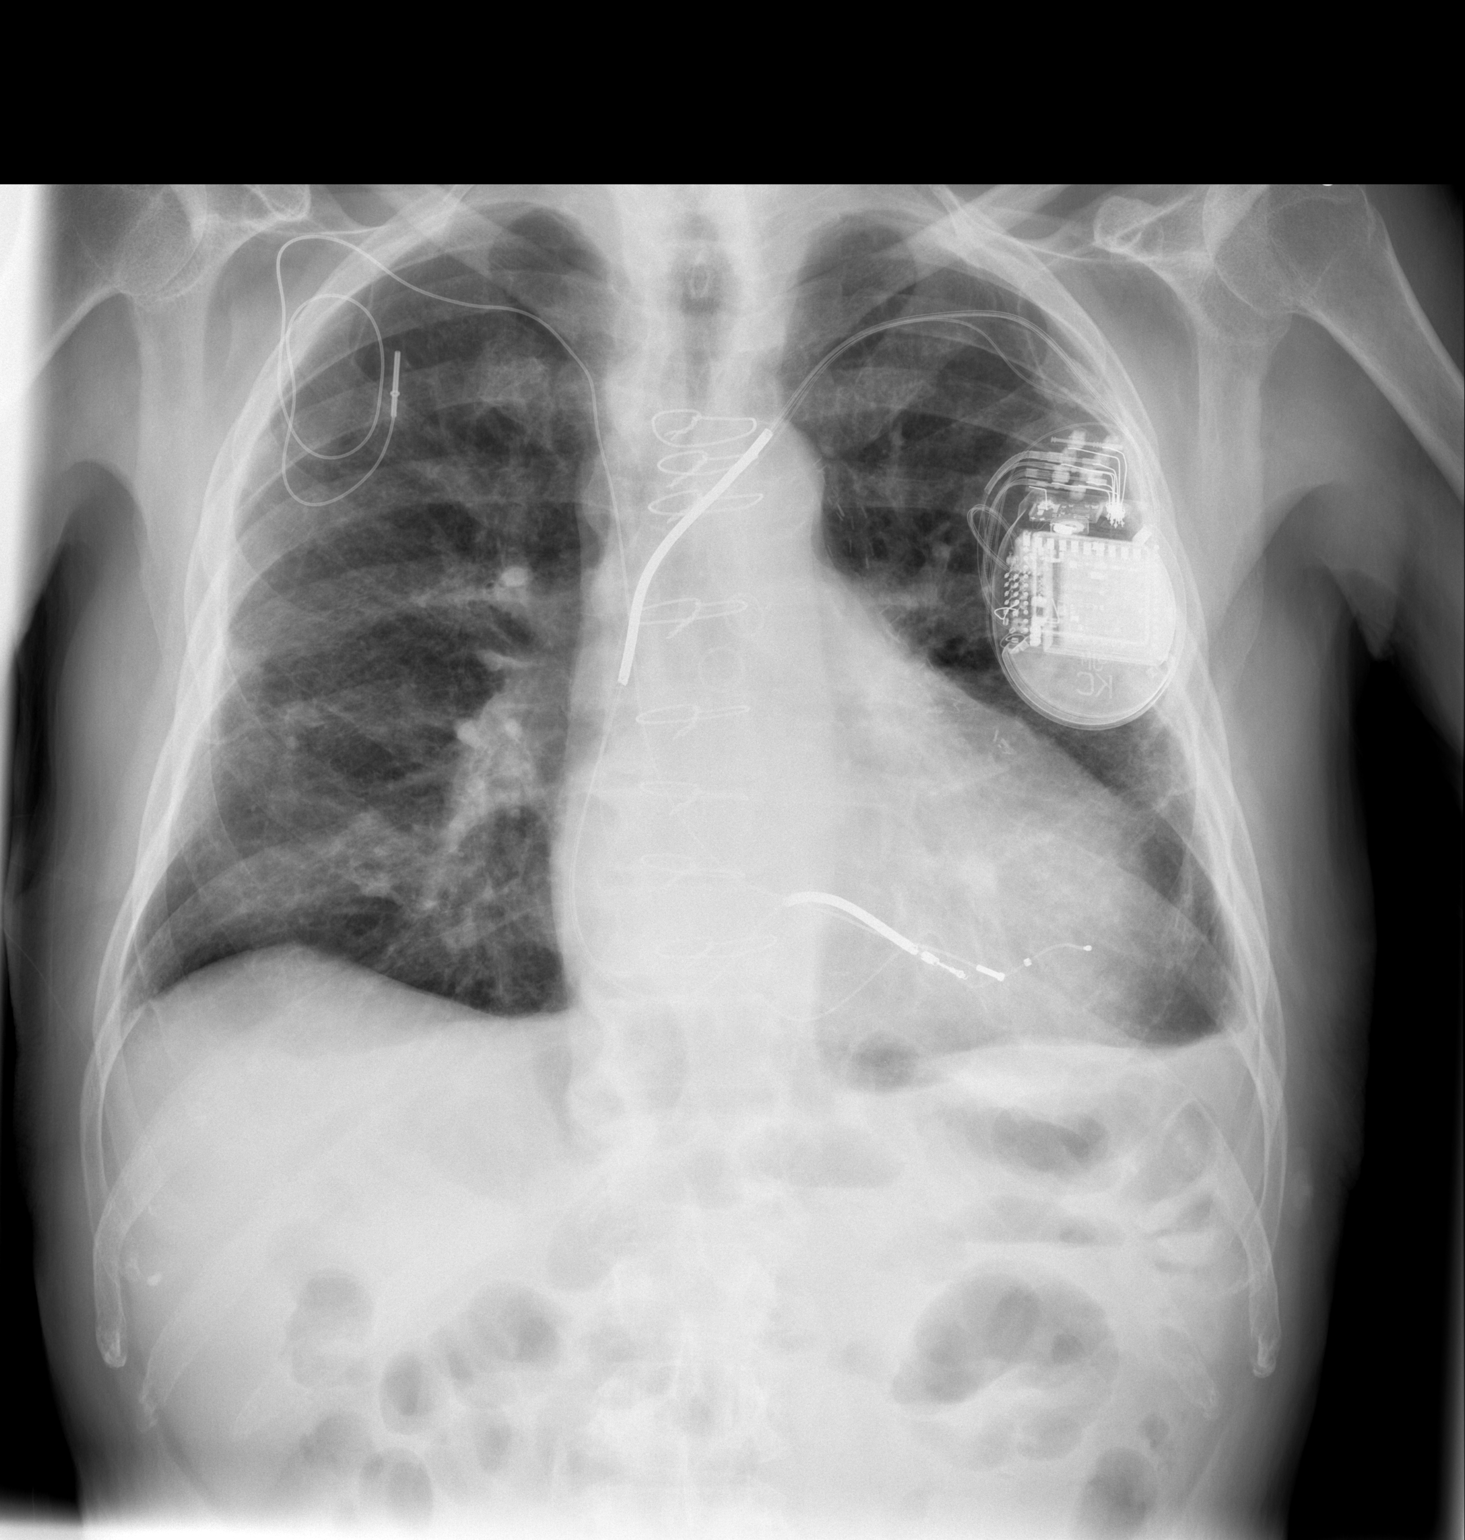

[w chest lat]
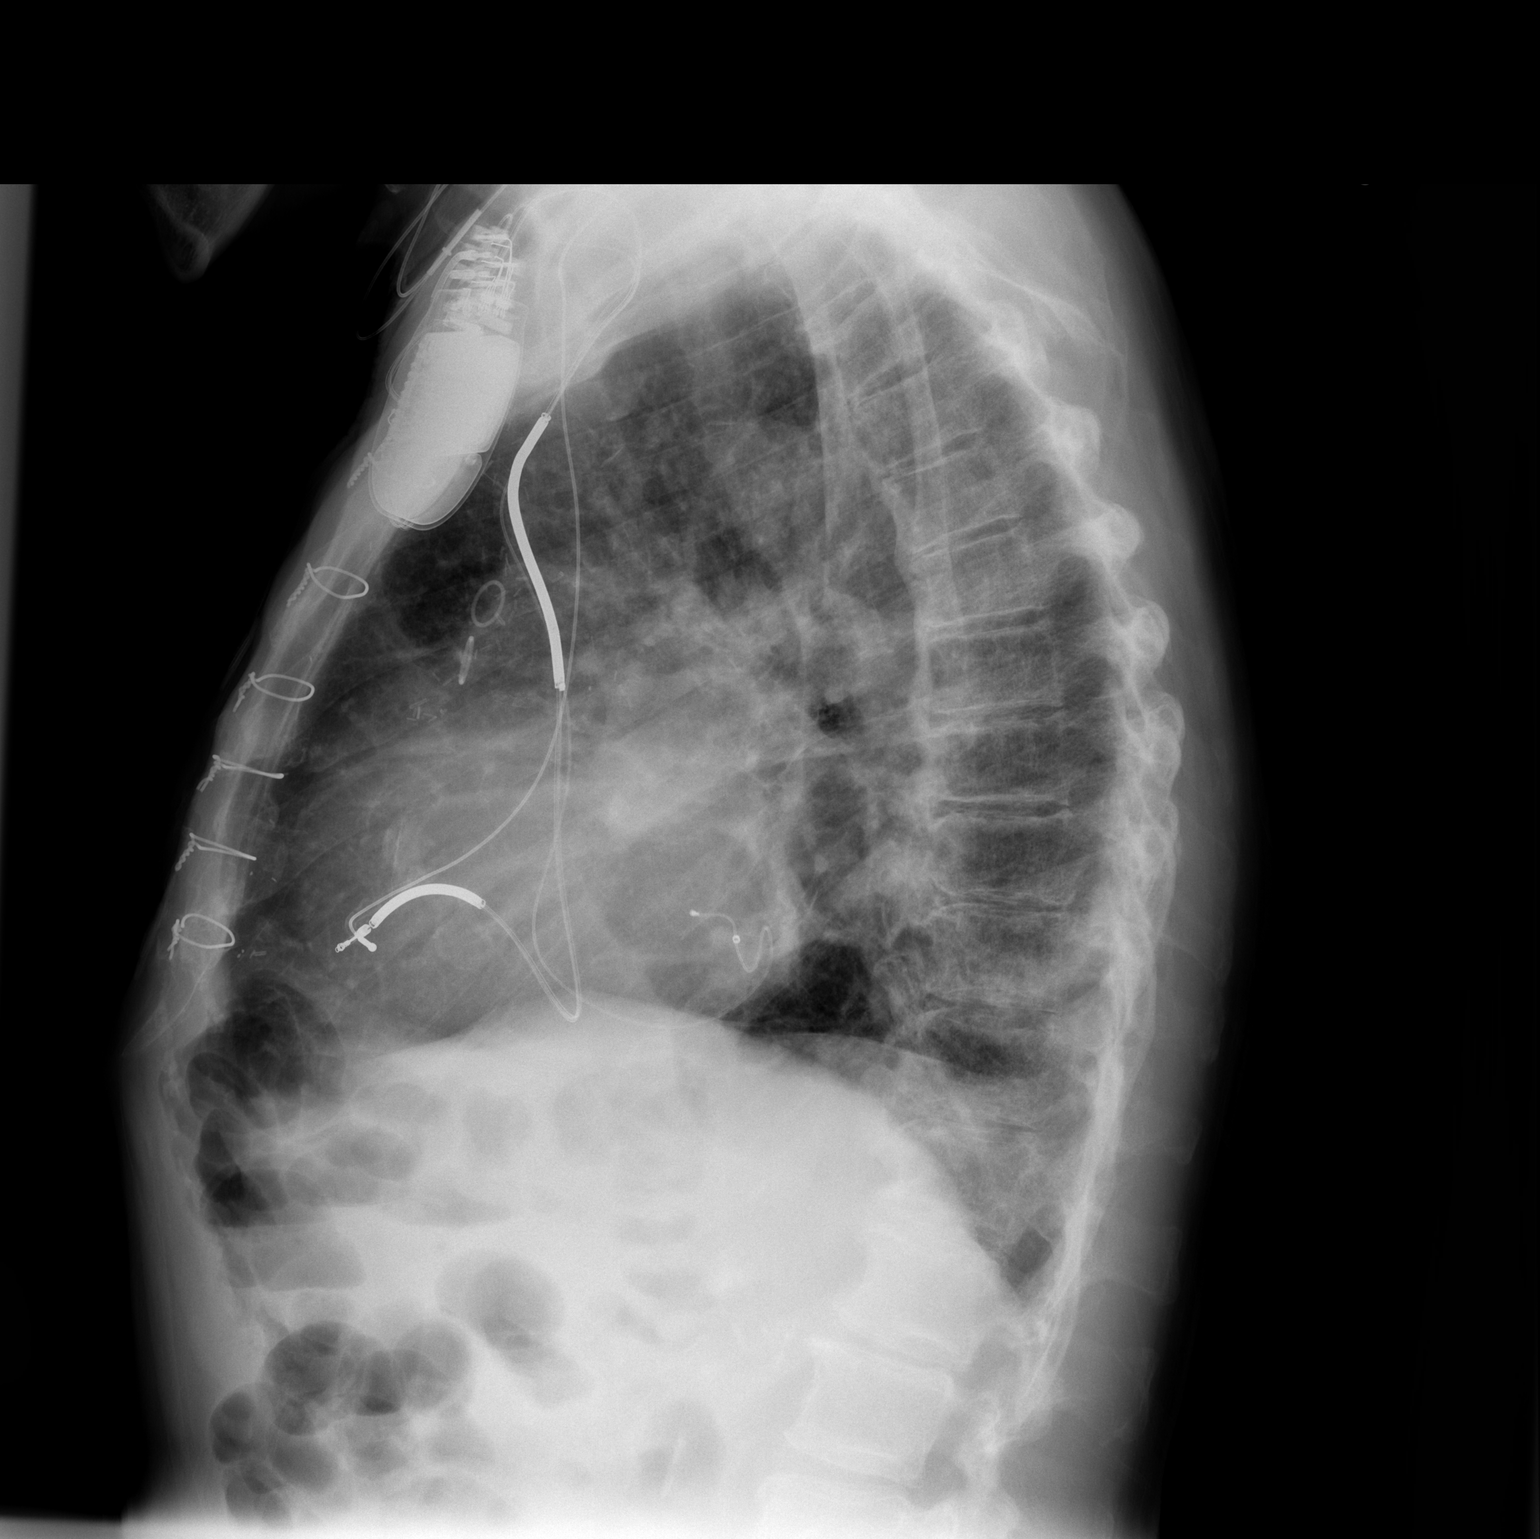

[2 of 2 positions shown; findings below may reference images not displayed]

FINDINGS: The lungs are well-aerated. Blunting of the left costophrenic angle
may reflect chronic pleural thickening. Mild bilateral peripheral
scarring is seen. Pulmonary vascularity is at the upper limits of
normal. There is no evidence of pleural effusion or pneumothorax.

Mild right basilar airspace opacity may reflect mild pneumonia.
There appears to be a 1.3 cm spiculated nodule at the left lung
base, not visualized on prior studies. This may be artifactual in
nature, but CT of the chest would be helpful for further evaluation,
on an elective nonemergent basis.

The heart is normal in size; the patient is status post median
sternotomy, with evidence of prior CABG. A left-sided AICD is noted,
with the lead ending at the right ventricle. An orphaned lead is
noted ending at the coronary sinus. No acute osseous abnormalities
are seen.
IMPRESSION: 1. Mild right basilar airspace opacity may reflect mild pneumonia.
2. Apparent 1.3 cm spiculated nodule at the left lung base, not
visualized on prior studies. This may be artifactual in nature, but
CT of the chest would be helpful for further evaluation, on an
elective nonemergent basis.
3. Chronic bilateral lung changes seen, with peripheral scarring and
blunting of the left costophrenic angle.

## 2015-03-08 IMAGING — CT CT CHEST W/O CM
2 of 4 series · 15 of 36 positions shown, 18 images · non-contrast
Comparison: Chest radiographs 08/06/2013 and CT 06/30/2012 and
03/24/2011

CLINICAL DATA: Persistent cough and mild shortness of breath for 1
week. Possible spiculated left lower lobe nodule on chest x-ray.

EXAM:
CT CHEST WITHOUT CONTRAST
TECHNIQUE: Multidetector CT imaging of the chest was performed following the
standard protocol without IV contrast..

[Series 2: thorax 5.0 i31f 1 · axial · 0.63mm/px · z∈[+360,+620]mm · 12 of 60 slices shown, 15 images]
[im 4/60  mediastinal]
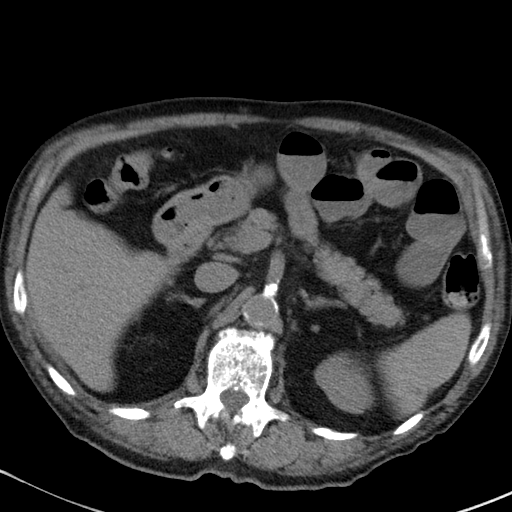
[im 4/60  lung]
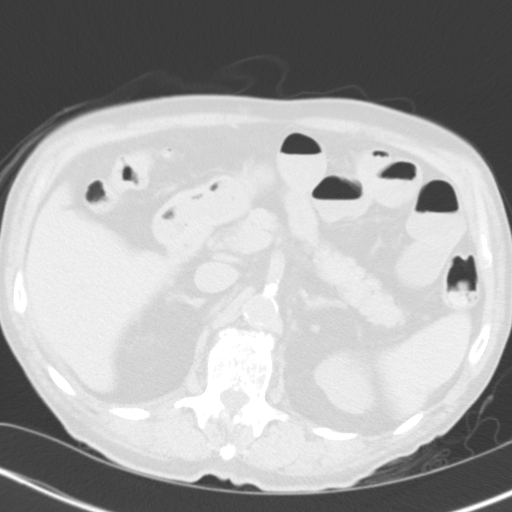
[im 8/60  lung]
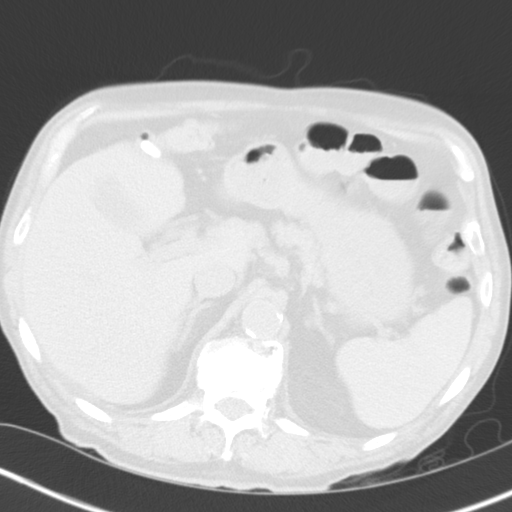
[im 12/60  lung]
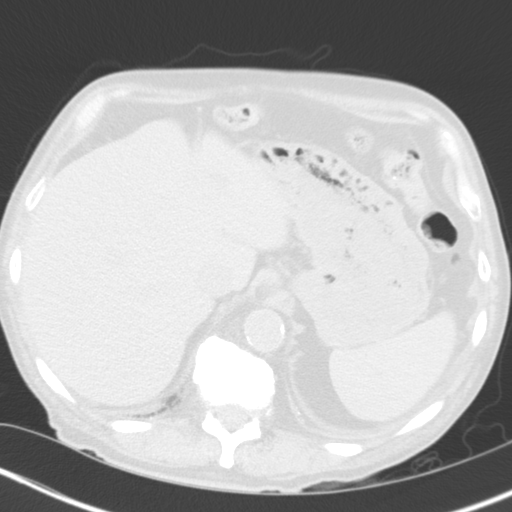
[im 20/60  lung]
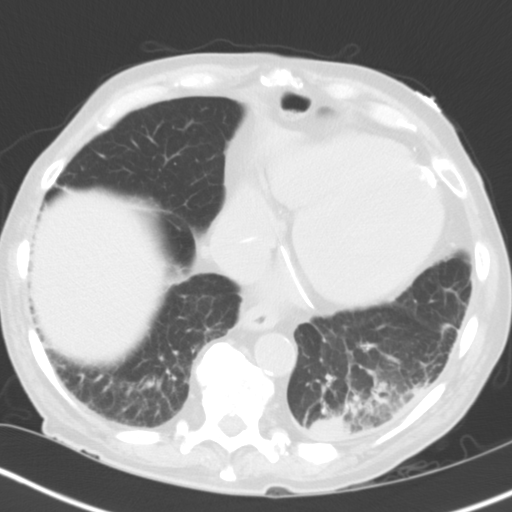
[im 24/60  mediastinal]
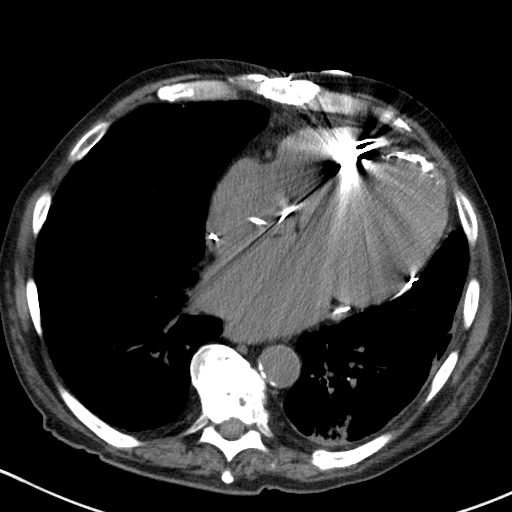
[im 24/60  lung]
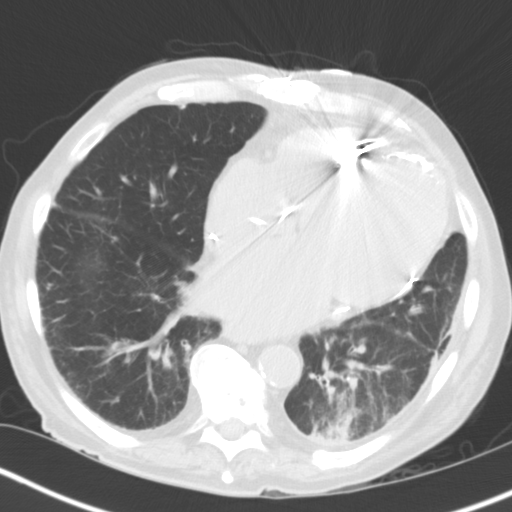
[im 28/60  lung]
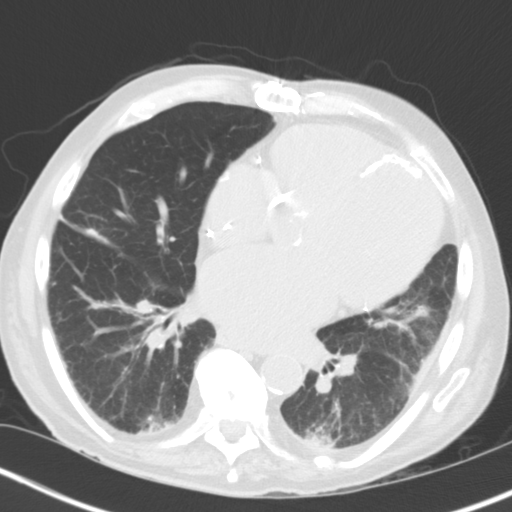
[im 32/60  lung]
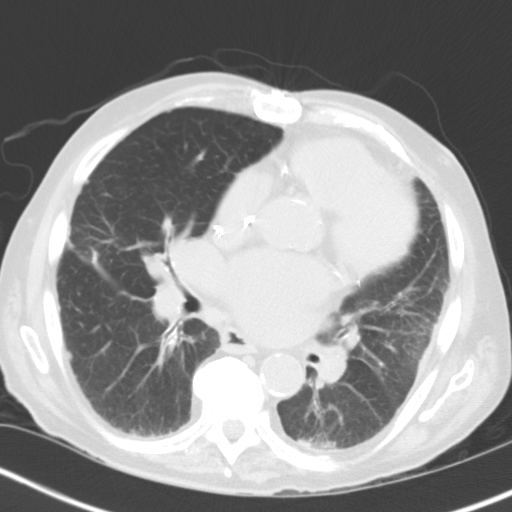
[im 36/60  lung]
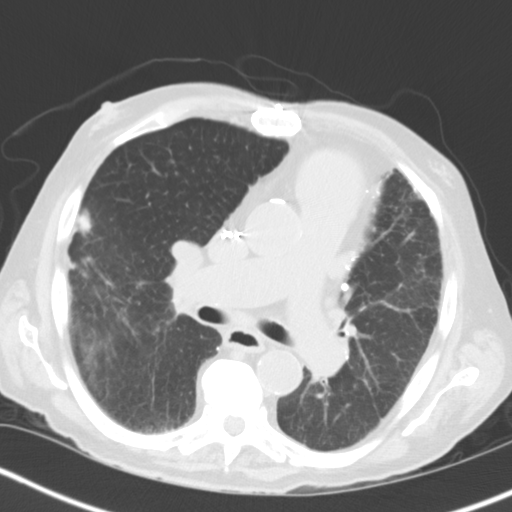
[im 40/60  mediastinal]
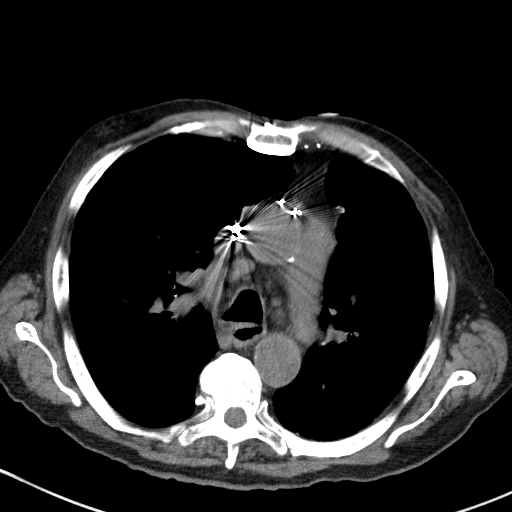
[im 40/60  lung]
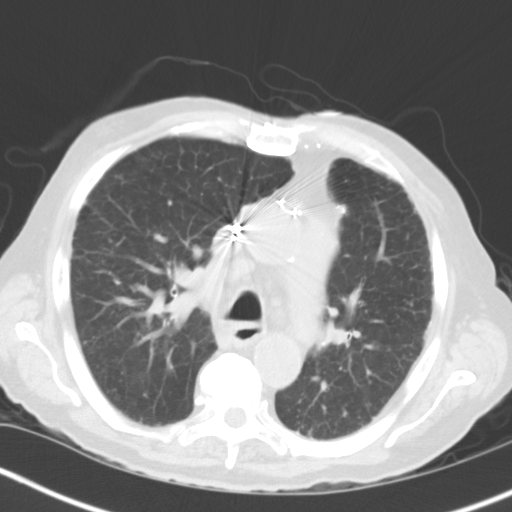
[im 48/60  lung]
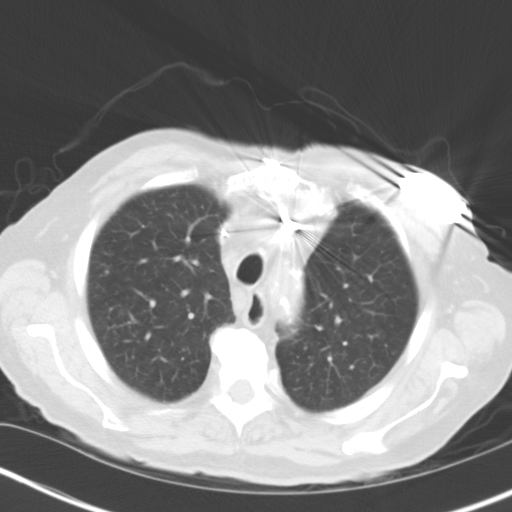
[im 52/60  lung]
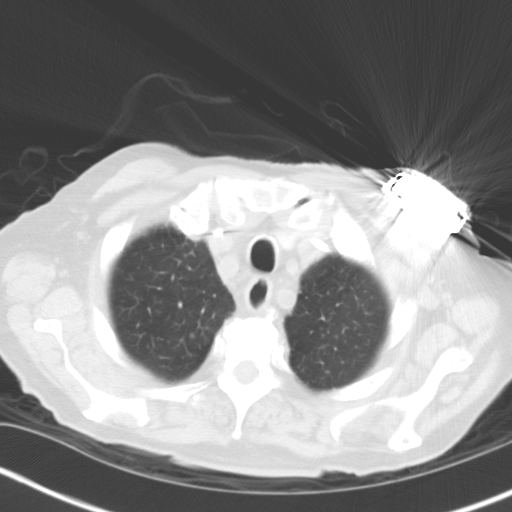
[im 56/60  lung]
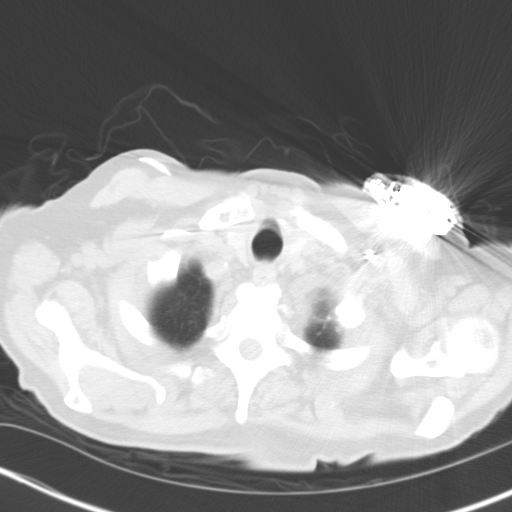

[Series 5: coronal · coronal · 0.59mm/px · 3 of 89 slices shown]
[im 18/89  lung]
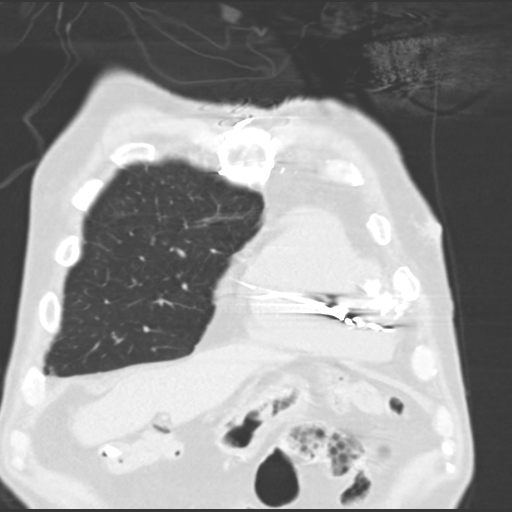
[im 36/89  lung]
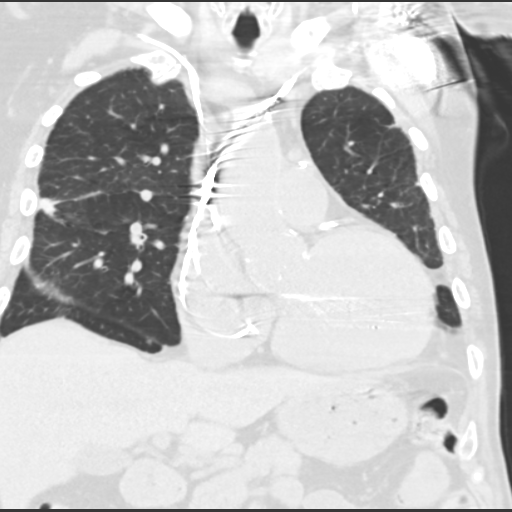
[im 53/89  lung]
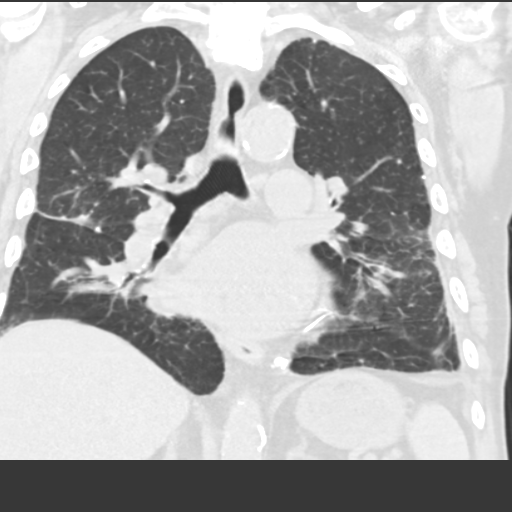

[15 of 36 positions shown; findings below may reference images not displayed]

FINDINGS: Multiple mildly prominent lymph nodes are present throughout the
mediastinum and have slightly increased in size from the prior
study, nonspecific. Prevascular lymph nodes measure up to 8 mm in
short axis. Right paratracheal lymph nodes measure up to 12 mm in
size. No enlarged hilar lymph nodes are identified. Left subclavian
3 lead ICD is seen with leads in the right atrium, right ventricle,
and coronary sinus. Orphaned right subclavian approach right
ventricular lead is also noted. Sequelae of prior CABG are
identified. There is left-sided cardiac chamber enlargement, similar
to the prior study. Three-vessel coronary artery calcifications are
noted. Coarse calcification involving the region of the left
ventricular apex and septal wall is similar to the prior study and
likely reflects prior myocardial infarction.

There is no pleural or pericardial effusion. Evaluation of the lung
parenchyma is mildly limited by motion. There is a 3 mm ground-glass
nodule in the apical right upper lobe (series 3, image 7),
unchanged. 4 mm ground-glass nodule in the superior segment of the
right lower lobe is unchanged (image 22). 2 mm right upper lobe
nodule is also unchanged (image 11).

Subpleural band like opacity in the right upper lobe adjacent to the
minor and major fissures with some associated calcification as well
as an adjacent 4 mm calcified nodule do not appear significantly
changed and likely reflect sequelae of prior infection/
inflammation. Focal pleural thickening in the anterolateral left
hemithorax is unchanged (image 14). Calcified left upper lobe nodule
as well as adjacent 3 mm noncalcified upper lobe nodule are
unchanged (image 21). Chronic parenchymal consolidation/scarring in
the dependent left lower lobe adjacent to pleural calcification is
unchanged from the 2 prior studies. Minimal subpleural opacity in
the dependent right lower lobe with adjacent parenchymal
calcification is unchanged and may also reflect scarring and/or
atelectasis.

No new region of confluent airspace opacity is seen. Areas of mild
subpleural reticulation within both lungs, most prominently in the
lower lobes and lingula, are grossly similar to the prior studies,
although evaluation is limited by motion artifact on all
examinations.

The visualized portion of the upper abdomen is unremarkable.
Multiple old posterior left rib fractures are noted. Multilevel
bridging osteophytosis is present throughout the thoracic spine.
IMPRESSION: No definite acute abnormality identified in the chest. Grossly
stable appearance of chronic lung changes allowing for motion
artifact as above.

## 2015-03-23 DIAGNOSIS — J449 Chronic obstructive pulmonary disease, unspecified: Secondary | ICD-10-CM | POA: Diagnosis not present

## 2015-03-24 DIAGNOSIS — I10 Essential (primary) hypertension: Secondary | ICD-10-CM | POA: Diagnosis not present

## 2015-03-24 DIAGNOSIS — D649 Anemia, unspecified: Secondary | ICD-10-CM | POA: Diagnosis not present

## 2015-03-24 DIAGNOSIS — I5020 Unspecified systolic (congestive) heart failure: Secondary | ICD-10-CM | POA: Diagnosis not present

## 2015-03-24 DIAGNOSIS — I480 Paroxysmal atrial fibrillation: Secondary | ICD-10-CM | POA: Diagnosis not present

## 2015-03-30 DIAGNOSIS — I509 Heart failure, unspecified: Secondary | ICD-10-CM | POA: Diagnosis not present

## 2015-03-30 DIAGNOSIS — I480 Paroxysmal atrial fibrillation: Secondary | ICD-10-CM | POA: Diagnosis not present

## 2015-03-30 DIAGNOSIS — E119 Type 2 diabetes mellitus without complications: Secondary | ICD-10-CM | POA: Diagnosis not present

## 2015-03-30 DIAGNOSIS — I10 Essential (primary) hypertension: Secondary | ICD-10-CM | POA: Diagnosis not present

## 2015-04-05 DIAGNOSIS — E119 Type 2 diabetes mellitus without complications: Secondary | ICD-10-CM | POA: Diagnosis not present

## 2015-04-23 DIAGNOSIS — J449 Chronic obstructive pulmonary disease, unspecified: Secondary | ICD-10-CM | POA: Diagnosis not present

## 2015-05-10 ENCOUNTER — Ambulatory Visit (INDEPENDENT_AMBULATORY_CARE_PROVIDER_SITE_OTHER): Payer: Medicare Other | Admitting: *Deleted

## 2015-05-10 DIAGNOSIS — I482 Chronic atrial fibrillation, unspecified: Secondary | ICD-10-CM

## 2015-05-10 DIAGNOSIS — Z950 Presence of cardiac pacemaker: Secondary | ICD-10-CM

## 2015-05-10 NOTE — Progress Notes (Signed)
Remote pacemaker transmission.   

## 2015-05-21 DIAGNOSIS — J449 Chronic obstructive pulmonary disease, unspecified: Secondary | ICD-10-CM | POA: Diagnosis not present

## 2015-06-10 ENCOUNTER — Encounter: Payer: Self-pay | Admitting: Cardiology

## 2015-06-10 ENCOUNTER — Emergency Department (HOSPITAL_COMMUNITY)
Admission: EM | Admit: 2015-06-10 | Discharge: 2015-06-11 | Disposition: A | Discharge status: Home / Self Care | Payer: Medicare Other | Attending: Emergency Medicine | Admitting: Emergency Medicine

## 2015-06-10 ENCOUNTER — Emergency Department (HOSPITAL_COMMUNITY): Payer: Medicare Other

## 2015-06-10 ENCOUNTER — Encounter (HOSPITAL_COMMUNITY): Payer: Self-pay | Admitting: Family Medicine

## 2015-06-10 DIAGNOSIS — M109 Gout, unspecified: Secondary | ICD-10-CM | POA: Insufficient documentation

## 2015-06-10 DIAGNOSIS — Z951 Presence of aortocoronary bypass graft: Secondary | ICD-10-CM | POA: Diagnosis not present

## 2015-06-10 DIAGNOSIS — E7800 Pure hypercholesterolemia, unspecified: Secondary | ICD-10-CM | POA: Diagnosis not present

## 2015-06-10 DIAGNOSIS — I10 Essential (primary) hypertension: Secondary | ICD-10-CM | POA: Insufficient documentation

## 2015-06-10 DIAGNOSIS — Z8546 Personal history of malignant neoplasm of prostate: Secondary | ICD-10-CM | POA: Diagnosis not present

## 2015-06-10 DIAGNOSIS — M1990 Unspecified osteoarthritis, unspecified site: Secondary | ICD-10-CM | POA: Diagnosis not present

## 2015-06-10 DIAGNOSIS — Z85118 Personal history of other malignant neoplasm of bronchus and lung: Secondary | ICD-10-CM | POA: Insufficient documentation

## 2015-06-10 DIAGNOSIS — E119 Type 2 diabetes mellitus without complications: Secondary | ICD-10-CM | POA: Insufficient documentation

## 2015-06-10 DIAGNOSIS — R0602 Shortness of breath: Secondary | ICD-10-CM

## 2015-06-10 DIAGNOSIS — Z87442 Personal history of urinary calculi: Secondary | ICD-10-CM | POA: Diagnosis not present

## 2015-06-10 DIAGNOSIS — Z79899 Other long term (current) drug therapy: Secondary | ICD-10-CM | POA: Diagnosis not present

## 2015-06-10 DIAGNOSIS — R05 Cough: Secondary | ICD-10-CM

## 2015-06-10 DIAGNOSIS — R110 Nausea: Secondary | ICD-10-CM

## 2015-06-10 DIAGNOSIS — I5022 Chronic systolic (congestive) heart failure: Secondary | ICD-10-CM | POA: Insufficient documentation

## 2015-06-10 DIAGNOSIS — Z7952 Long term (current) use of systemic steroids: Secondary | ICD-10-CM | POA: Insufficient documentation

## 2015-06-10 DIAGNOSIS — J441 Chronic obstructive pulmonary disease with (acute) exacerbation: Secondary | ICD-10-CM | POA: Diagnosis not present

## 2015-06-10 DIAGNOSIS — Z950 Presence of cardiac pacemaker: Secondary | ICD-10-CM | POA: Insufficient documentation

## 2015-06-10 DIAGNOSIS — I2510 Atherosclerotic heart disease of native coronary artery without angina pectoris: Secondary | ICD-10-CM | POA: Diagnosis not present

## 2015-06-10 DIAGNOSIS — Z8719 Personal history of other diseases of the digestive system: Secondary | ICD-10-CM | POA: Diagnosis not present

## 2015-06-10 DIAGNOSIS — Z87891 Personal history of nicotine dependence: Secondary | ICD-10-CM | POA: Insufficient documentation

## 2015-06-10 DIAGNOSIS — R079 Chest pain, unspecified: Secondary | ICD-10-CM | POA: Diagnosis not present

## 2015-06-10 DIAGNOSIS — R059 Cough, unspecified: Secondary | ICD-10-CM

## 2015-06-10 LAB — CUP PACEART REMOTE DEVICE CHECK
Date Time Interrogation Session: 20170228084647
Implantable Lead Implant Date: 20050706
Implantable Lead Implant Date: 20080423
Implantable Lead Location: 753858
Implantable Lead Location: 753860
Implantable Lead Model: 1158
Implantable Lead Model: 1581
Lead Channel Impedance Value: 530 Ohm
Lead Channel Impedance Value: 640 Ohm
Lead Channel Pacing Threshold Amplitude: 0.5 V
Lead Channel Pacing Threshold Pulse Width: 0.8 ms
Lead Channel Sensing Intrinsic Amplitude: 6.8 mV
Lead Channel Setting Pacing Amplitude: 2.5 V
Lead Channel Setting Pacing Pulse Width: 0.5 ms
Lead Channel Setting Pacing Pulse Width: 0.8 ms
Lead Channel Setting Sensing Sensitivity: 2 mV
MDC IDC MSMT BATTERY REMAINING LONGEVITY: 73 mo
MDC IDC MSMT BATTERY REMAINING PERCENTAGE: 95.5 %
MDC IDC MSMT BATTERY VOLTAGE: 2.98 V
MDC IDC MSMT LEADCHNL LV PACING THRESHOLD AMPLITUDE: 2.25 V
MDC IDC MSMT LEADCHNL RV PACING THRESHOLD PULSEWIDTH: 0.5 ms
MDC IDC SET LEADCHNL LV PACING AMPLITUDE: 3.25 V
Pulse Gen Model: 3222
Pulse Gen Serial Number: 7634311

## 2015-06-10 LAB — BASIC METABOLIC PANEL
Anion gap: 12 (ref 5–15)
BUN: 28 mg/dL — ABNORMAL HIGH (ref 6–20)
CO2: 27 mmol/L (ref 22–32)
Calcium: 9.6 mg/dL (ref 8.9–10.3)
Chloride: 102 mmol/L (ref 101–111)
Creatinine, Ser: 1.66 mg/dL — ABNORMAL HIGH (ref 0.61–1.24)
GFR calc Af Amer: 43 mL/min — ABNORMAL LOW (ref >60)
GFR, EST NON AFRICAN AMERICAN: 37 mL/min — AB (ref >60)
Glucose, Bld: 106 mg/dL — ABNORMAL HIGH (ref 65–99)
POTASSIUM: 4.8 mmol/L (ref 3.5–5.1)
SODIUM: 141 mmol/L (ref 135–145)

## 2015-06-10 LAB — I-STAT TROPONIN, ED: TROPONIN I, POC: 0.04 ng/mL (ref 0.00–0.08)

## 2015-06-10 LAB — CBC
HEMATOCRIT: 31.4 % — AB (ref 39.0–52.0)
Hemoglobin: 10.1 g/dL — ABNORMAL LOW (ref 13.0–17.0)
MCH: 31.2 pg (ref 26.0–34.0)
MCHC: 32.2 g/dL (ref 30.0–36.0)
MCV: 96.9 fL (ref 78.0–100.0)
Platelets: 137 10*3/uL — ABNORMAL LOW (ref 150–400)
RBC: 3.24 MIL/uL — ABNORMAL LOW (ref 4.22–5.81)
RDW: 15.4 % (ref 11.5–15.5)
WBC: 2.7 10*3/uL — AB (ref 4.0–10.5)

## 2015-06-10 MED ORDER — IPRATROPIUM-ALBUTEROL 0.5-2.5 (3) MG/3ML IN SOLN
3.0000 mL | Freq: Once | RESPIRATORY_TRACT | Status: AC
  Administered 2015-06-10: 3 mL via RESPIRATORY_TRACT
  Filled 2015-06-10: qty 3

## 2015-06-10 MED ORDER — ALBUTEROL SULFATE HFA 108 (90 BASE) MCG/ACT IN AERS: 2.0000 | INHALATION_SPRAY | RESPIRATORY_TRACT | Status: AC | PRN

## 2015-06-10 MED ORDER — BENZONATATE 100 MG PO CAPS: 100.0000 mg | ORAL_CAPSULE | Freq: Three times a day (TID) | ORAL | Status: DC

## 2015-06-10 MED ORDER — ONDANSETRON HCL 4 MG/2ML IJ SOLN
4.0000 mg | Freq: Once | INTRAMUSCULAR | Status: AC
  Administered 2015-06-10: 4 mg via INTRAVENOUS
  Filled 2015-06-10: qty 2

## 2015-06-10 MED ORDER — PREDNISONE 20 MG PO TABS: 40.0000 mg | ORAL_TABLET | Freq: Every day | ORAL | Status: DC

## 2015-06-10 MED ORDER — ONDANSETRON 4 MG PO TBDP: 4.0000 mg | ORAL_TABLET | Freq: Three times a day (TID) | ORAL | Status: DC | PRN

## 2015-06-10 MED ORDER — AZITHROMYCIN 250 MG PO TABS: 250.0000 mg | ORAL_TABLET | Freq: Every day | ORAL | Status: DC

## 2015-06-10 MED ORDER — SODIUM CHLORIDE 0.9 % IV BOLUS (SEPSIS)
1000.0000 mL | Freq: Once | INTRAVENOUS | Status: AC
  Administered 2015-06-10: 1000 mL via INTRAVENOUS

## 2015-06-10 NOTE — ED Provider Notes (Signed)
CSN: [id number]     Arrival date & time 06/10/15  1724 History   None    Chief Complaint  Patient presents with  . Cough     (Consider location/radiation/quality/duration/timing/severity/associated sxs/prior Treatment) HPI   [name] is a 82 y.o. male, with a history of lung cancer, CHF, COPD, CVA, and DM, presenting to the ED with cough for the last three days. Pt also endorses some chest pain, worsened by cough, shortness of breath, nausea, and subjective fever and chills. Pt rates his chest pain at 8/10, describes it as a tightness, and moves from the central chest to the left chest. Pt wears 2.5 lpm O2 at home due to COPD and lung CA. Pt denies vomiting/diarrhea, abdominal pain, dizziness, or any other complaints.     Past Medical History  Diagnosis Date  . Diabetes mellitus   . Hypertension   . Chronic systolic CHF (congestive heart failure) ([place])     a. 12/2012 Echo: EF 20-25%, mid-dist antsept AK, mod dil LA.  [name] CAD (coronary artery disease)     a. 07/2002 CABG x 3: LIMA->LAD, VG->Diag, VG->OM;  b. 06/2006 Cath: LM 50-60ost/p, LAD patent mid stent, D1 sev dzs, D2 patent stent, LCX nl, OM2 sev sten prox, RCA large/nl, VG->Diag nl, VG->OM nl, LIMA->LAD atretic, EF 30%.  . Ischemic cardiomyopathy     a. 06/2006 s/p SJM Promote RF, model 3207-36 BiV ICD, ser #: [id number].  [name] Hypercholesterolemia   . Black lung disease ([facility])   . Nephrolithiasis 09/2000  . CVA (cerebral vascular accident) ([place])   . Prostate cancer ([place]) 11/01/10    gleason 7, 8, 9, gold seeds 02/08/11  . Lung cancer ([place])   . COPD (chronic obstructive pulmonary disease) ([place])     a. On home O2.  [name] GERD (gastroesophageal reflux disease)   . Arthritis   . Gout   . Carotid artery occlusion    Past Surgical History  Procedure Laterality Date  . Colonoscopy    . Esophagogastroduodenoscopy  02/19/2011    Procedure: ESOPHAGOGASTRODUODENOSCOPY (EGD);  Surgeon: Jeryl Columbia, MD;  Location: [facility] ENDOSCOPY;  Service:  Endoscopy;  Laterality: N/A;  . Upper endoscpopy    . Coronary angioplasty with stent placement  07/1997; 08/1997;03/1998  . Coronary artery bypass graft  07/2002    CABG X3  . Incision and drainage of wound  08/2002    right thigh; S/P EVH  . Insert / replace / remove pacemaker  1979; 1992; 01/2000;  . Insert / replace / remove pacemaker  09/2003; 06/2006    w/AICD  . Insert / replace / remove pacemaker  12/2004    pacmaker explant  . Shoulder arthroscopy w/ rotator cuff repair  05/2008    left  . Bi-ventricular pacemaker insertion (crt-p)  12-02-13    downgrade of previously implanted CRTD to STJ CRTP  . Biv pacemaker generator change out N/A 12/02/2013    Procedure: BIV PACEMAKER GENERATOR CHANGE OUT;  Surgeon: Evans Lance, MD;  Location: [facility] CATH LAB;  Service: Cardiovascular;  Laterality: N/A;   Family History  Problem Relation Age of Onset  . Alzheimer's disease Father 6  . Cancer Father 15    metastatic prostate cancer  . Diabetes Sister 77  . Diabetes Brother   . Diabetes Brother   . Diabetes Brother   . Hypotension Neg Hx   . Malignant hyperthermia Neg Hx   . Pseudochol deficiency Neg Hx    Social History  Substance Use  Topics  . Smoking status: Former Smoker    Types: Cigarettes    Quit date: 03/12/1949  . Smokeless tobacco: Current User    Types: Chew  . Alcohol Use: No     Comment: drank until per pt 03/19/11    Review of Systems  Constitutional: Positive for fever and chills. Negative for diaphoresis.  Respiratory: Positive for cough and shortness of breath.   Cardiovascular: Positive for chest pain.  Gastrointestinal: Positive for nausea. Negative for vomiting, abdominal pain and diarrhea.  Genitourinary: Negative for dysuria.  Musculoskeletal: Negative for myalgias, back pain and neck pain.  Skin: Negative for color change and pallor.  Neurological: Negative for dizziness, light-headedness and headaches.  All other systems reviewed and are  negative.     Allergies  Aspirin  Home Medications   Prior to Admission medications   Medication Sig Start Date End Date Taking? Authorizing Provider  acetaminophen (TYLENOL) 500 MG tablet Take 500 mg by mouth every 6 (six) hours as needed (pain).   Yes Historical Provider, MD  albuterol (PROVENTIL HFA;VENTOLIN HFA) 108 (90 BASE) MCG/ACT inhaler Inhale 2 puffs into the lungs every 4 (four) hours as needed for wheezing or shortness of breath.   Yes Historical Provider, MD  albuterol (PROVENTIL) (2.5 MG/3ML) 0.083% nebulizer solution Take 2.5 mg by nebulization every 6 (six) hours as needed for wheezing or shortness of breath.   Yes Historical Provider, MD  allopurinol (ZYLOPRIM) 300 MG tablet Take 300 mg by mouth daily.     Yes Historical Provider, MD  CALCIUM-VITAMIN D PO Take 1 tablet by mouth 2 (two) times daily.    Yes Historical Provider, MD  digoxin (LANOXIN) 0.125 MG tablet Take 125 mcg by mouth daily.     Yes Historical Provider, MD  furosemide (LASIX) 20 MG tablet Take 20 mg by mouth daily.   Yes Historical Provider, MD  isosorbide mononitrate (IMDUR) 30 MG 24 hr tablet Take 30 mg by mouth daily. 07/27/13  Yes Historical Provider, MD  lisinopril (PRINIVIL,ZESTRIL) 10 MG tablet Take 10 mg by mouth daily.   Yes Historical Provider, MD  metoprolol succinate (TOPROL-XL) 25 MG 24 hr tablet Take 25 mg by mouth daily. 07/27/13  Yes Historical Provider, MD  nitroGLYCERIN (NITROSTAT) 0.4 MG SL tablet Place 0.4 mg under the tongue every 5 (five) minutes as needed for chest pain.   Yes Historical Provider, MD  Omega-3 Fatty Acids (FISH OIL) 1000 MG CAPS Take 1,000 mg by mouth 2 (two) times daily.    Yes Historical Provider, MD  OXYGEN Inhale 2.5 L into the lungs continuous.   Yes Historical Provider, MD  vitamin B-12 (CYANOCOBALAMIN) 1000 MCG tablet Take 1,000 mcg by mouth 2 (two) times daily.   Yes Historical Provider, MD  XARELTO 20 MG TABS tablet Take 20 mg by mouth daily with supper.   10/29/14  Yes Historical Provider, MD  albuterol (PROVENTIL HFA;VENTOLIN HFA) 108 (90 Base) MCG/ACT inhaler Inhale 2 puffs into the lungs every 4 (four) hours as needed for wheezing or shortness of breath. 06/10/15   Shawn C Joy, PA-C  azithromycin (ZITHROMAX) 250 MG tablet Take 1 tablet (250 mg total) by mouth daily. Take first 2 tablets together, then 1 every day until finished. 06/10/15   Shawn C Joy, PA-C  benzonatate (TESSALON) 100 MG capsule Take 1 capsule (100 mg total) by mouth every 8 (eight) hours. 06/10/15   Shawn C Joy, PA-C  hydrocortisone cream 1 % Apply to affected area 2 times daily Patient  taking differently: Apply 1 application topically 2 (two) times daily as needed for itching. Apply to affected area 2 times daily 10/05/14   Fransico Meadow, PA-C  ondansetron (ZOFRAN ODT) 4 MG disintegrating tablet Take 1 tablet (4 mg total) by mouth every 8 (eight) hours as needed for nausea or vomiting. 06/10/15   Shawn C Joy, PA-C  predniSONE (DELTASONE) 20 MG tablet Take 2 tablets (40 mg total) by mouth daily with breakfast. 06/10/15   Shawn C Joy, PA-C   BP 154/72 mmHg  Pulse 60  Temp(Src) 98.2 F (36.8 C) (Oral)  Resp 18  SpO2 100% Physical Exam  Constitutional: He is oriented to person, place, and time. He appears well-developed and well-nourished. No distress.  HENT:  Head: Normocephalic and atraumatic.  Eyes: Conjunctivae are normal. Pupils are equal, round, and reactive to light.  Neck: Neck supple.  Cardiovascular: Normal rate, regular rhythm, normal heart sounds and intact distal pulses.   Pulmonary/Chest: Effort normal and breath sounds normal. No respiratory distress.  Increased work of breathing, but able to speak in full sentences.   Abdominal: Soft. There is no tenderness. There is no guarding.  Musculoskeletal: He exhibits no edema or tenderness.  Lymphadenopathy:    He has no cervical adenopathy.  Neurological: He is alert and oriented to person, place, and time.  Skin:  Skin is warm and dry. He is not diaphoretic.  Psychiatric: He has a normal mood and affect. His behavior is normal.  Nursing note and vitals reviewed.   ED Course  Procedures (including critical care time) Labs Review Labs Reviewed  BASIC METABOLIC PANEL - Abnormal; Notable for the following:    Glucose, Bld 106 (*)    BUN 28 (*)    Creatinine, Ser 1.66 (*)    GFR calc non Af Amer 37 (*)    GFR calc Af Amer 43 (*)    All other components within normal limits  CBC - Abnormal; Notable for the following:    WBC 2.7 (*)    RBC 3.24 (*)    Hemoglobin 10.1 (*)    HCT 31.4 (*)    Platelets 137 (*)    All other components within normal limits  INFLUENZA PANEL BY PCR (TYPE A & B, H1N1)  I-STAT TROPOININ, ED    Imaging Review Dg Chest 2 View  06/10/2015  CLINICAL DATA:  Cough, shortness of breath. EXAM: CHEST  2 VIEW COMPARISON:  November 10, 2014 FINDINGS: Stable cardiomegaly. Status post coronary artery bypass graft. Left-sided pacemaker is unchanged in position. No pneumothorax is noted. No definite pleural effusion is noted. Blunting of left costophrenic sulcus is noted most consistent with scarring. Stable bibasilar scarring is noted. No acute pulmonary disease is noted. Anterior osteophyte formation is noted in lower thoracic spine. IMPRESSION: No active cardiopulmonary disease. Electronically Signed   By: Marijo Conception, M.D.   On: 06/10/2015 18:49   I have personally reviewed and evaluated these images and lab results as part of my medical decision-making.   EKG Interpretation   Date/Time:  Friday June 10 2015 21:32:39 EDT Ventricular Rate:  60 PR Interval:    QRS Duration: 149 QT Interval:  392 QTC Calculation: 392 R Axis:   -55 Text Interpretation:  Afib/flut and V-paced complexes No further analysis  attempted due to paced rhythm Baseline wander in lead(s) V1 since last  tracing no significant change Confirmed by MILLER  MD, BRIAN ([id number]) on  06/10/2015 9:41:04 PM  Medications  ipratropium-albuterol (DUONEB) 0.5-2.5 (3) MG/3ML nebulizer solution 3 mL (3 mLs Nebulization Given 06/10/15 2129)  sodium chloride 0.9 % bolus 1,000 mL (1,000 mLs Intravenous New Bag/Given 06/10/15 2128)  ondansetron (ZOFRAN) injection 4 mg (4 mg Intravenous Given 06/10/15 2128)    MDM   Final diagnoses:  Cough  Shortness of breath  Nausea    Azarion E Pugh presents with a cough accompanied by chest pain, shortness of breath, nausea, and subjective fever for the past three days.   Findings and plan of care discussed with Noemi Chapel, MD. Dr. Sabra Heck personally evaluated and examined this patient.  Patient's presentation is consistent with a viral illness, such as influenza. Patient is nontoxic appearing, afebrile, not tachycardic, not tachypneic, maintains SPO2 of 100% on his home O2 level of 2.5 L a minute, and is in no apparent distress. Patient has no signs of sepsis or other serious or life-threatening condition.Chest x-ray shows no acute abnormalities. Patient voiced improvement with DuoNeb. Slight increase in creatinine is likely from dehydration. IV fluids and reevaluate creatinine outpatient. Patient has an appointment with his PCP next Tuesday, April 4 and states he will pass on this information to his PCP at that time. These recommendations as well as return precautions were discussed with the patient. Albuterol inhaler, Tessalon, azithromycin, and prednisone prescribed to the patient. Patient voices understanding of all instructions, agrees to the plan, and is comfortable with discharge. Patient appears safe for discharge this time.  Filed Vitals:   06/10/15 2015 06/10/15 2051 06/10/15 2052 06/10/15 2100  BP: 109/55 130/82  139/62  Pulse: 60  45 63  Temp: 98.2 F (36.8 C)     TempSrc:      Resp: 20     SpO2: 100%  99% 100%   Filed Vitals:   06/10/15 2200 06/10/15 2215 06/10/15 2230 06/10/15 2300  BP: 135/91 130/58 111/63 154/72  Pulse: 59 60 60 60  Temp:       TempSrc:      Resp: '13 14 18 18  '$ SpO2: 100% 100% 100% 100%       Lorayne Bender, PA-C 06/10/15 2348  Noemi Chapel, MD 06/11/15 [phone]

## 2015-06-10 NOTE — Discharge Instructions (Signed)
You have been seen today for cough, shortness of breath, and fever. Your imaging and lab tests showed no abnormalities, other than an increase in your creatinine. This increase in the creatinine is likely due to dehydration. Increase fluid intake as long as symptoms last. Your symptoms are consistent with a viral illness. Viruses do not require antibiotics. Treatment is symptomatic care. Drink plenty of fluids and get plenty of rest. Tylenol for pain or fever. Zofran for nausea. Tessalon for cough. Plain Mucinex may help relieve congestion. Follow up with PCP at your previously scheduled appointment next Tuesday, April 4.  Return to ED should symptoms worsen.

## 2015-06-10 NOTE — ED Notes (Signed)
Pt stable, ambulatory, states understanding of discharge instructions 

## 2015-06-10 NOTE — ED Provider Notes (Signed)
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 He feels better after albuterol, chest x-ray negative, labs unremarkable, the patient will be discharged home after getting some IV fluids because of mild renal insufficiency. He will be informed of these results and encouraged to follow-up for a repeat creatinine within the week. He expressed his understanding to these verbal discharge instructions   EKG Interpretation  Date/Time:  Friday June 10 2015 21:32:39 EDT Ventricular Rate:  60 PR Interval:    QRS Duration: 149 QT Interval:  392 QTC Calculation: 392 R Axis:   -55 Text Interpretation:  Afib/flut and V-paced complexes No further analysis attempted due to paced rhythm Baseline wander in lead(s) V1 since last tracing no significant change Confirmed by Dagoberto Nealy  MD, Anthonie Lotito ([id number]) on 06/10/2015 9:41:04 PM       Medical screening examination/treatment/procedure(s) were conducted as a shared visit with non-physician practitioner(s) and myself.  I personally evaluated the patient during the encounter.  Clinical Impression:   Final diagnoses:  Cough  Shortness of breath  Nausea         Noemi Chapel, MD 06/11/15 [phone]

## 2015-06-10 NOTE — ED Notes (Signed)
Patient transported to x-ray. ?

## 2015-06-10 NOTE — ED Notes (Addendum)
Pt here for cough since yesterday. sts some cold chills. sts chest pain, soreness with coughing and dizziness.

## 2015-06-11 LAB — INFLUENZA PANEL BY PCR (TYPE A & B)
H1N1 flu by pcr: NOT DETECTED
INFLAPCR: POSITIVE — AB
INFLBPCR: NEGATIVE

## 2015-06-13 DIAGNOSIS — C61 Malignant neoplasm of prostate: Secondary | ICD-10-CM | POA: Diagnosis not present

## 2015-06-21 DIAGNOSIS — J449 Chronic obstructive pulmonary disease, unspecified: Secondary | ICD-10-CM | POA: Diagnosis not present

## 2015-07-20 IMAGING — US US ABDOMEN COMPLETE
1 series · 13 of 25 positions shown · non-contrast
Comparison: CT of the abdomen and pelvis from 01/10/2012, and renal
ultrasound performed 02/16/2011

CLINICAL DATA: Acute onset of generalized abdominal pain. Initial
encounter.

EXAM:
ULTRASOUND ABDOMEN COMPLETE

[Series 1: us abdomen complete · 0.26mm/px · 13 of 39 slices shown]
[im 1/39]
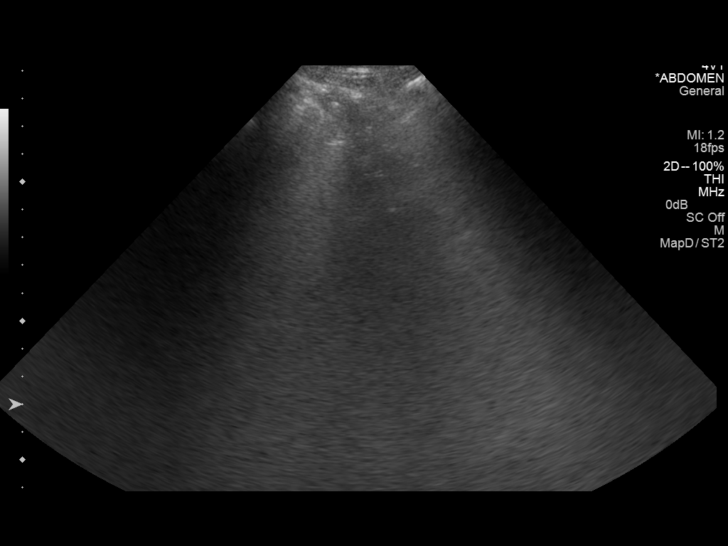
[im 4/39]
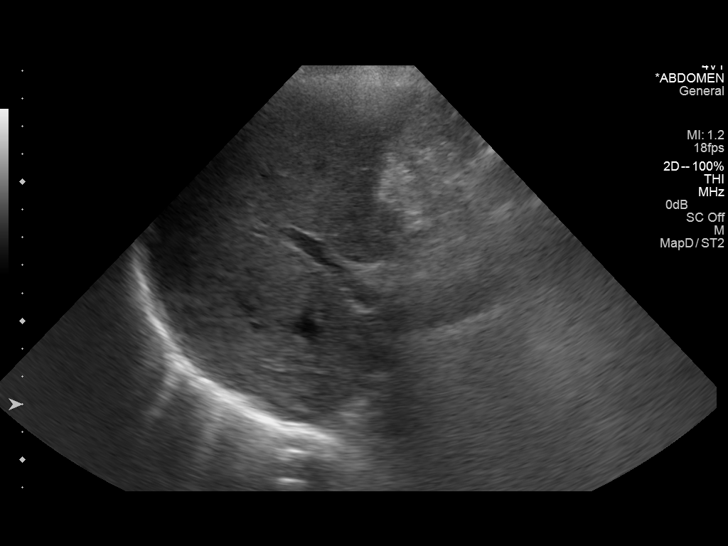
[im 7/39]
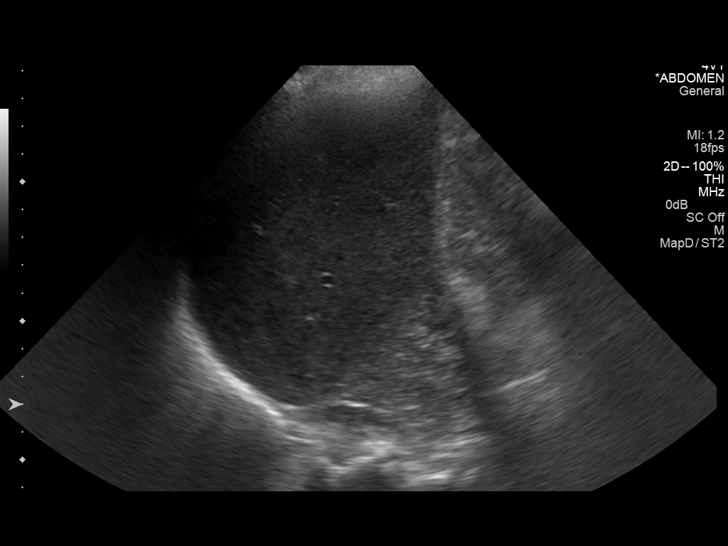
[im 10/39]
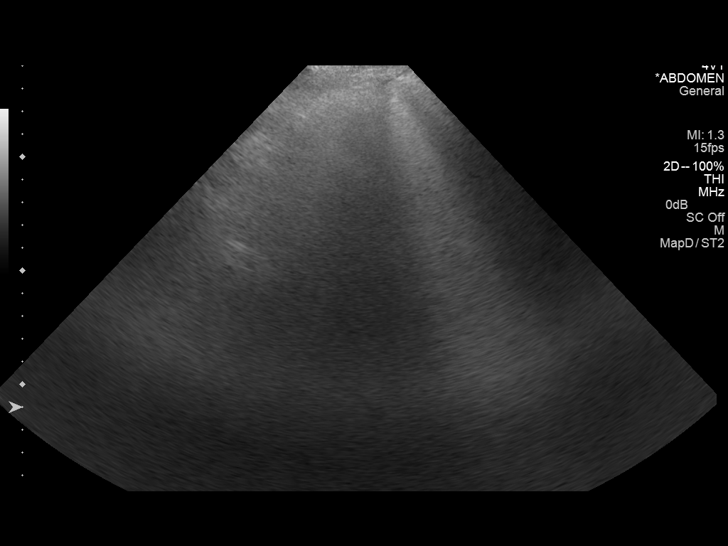
[im 13/39]
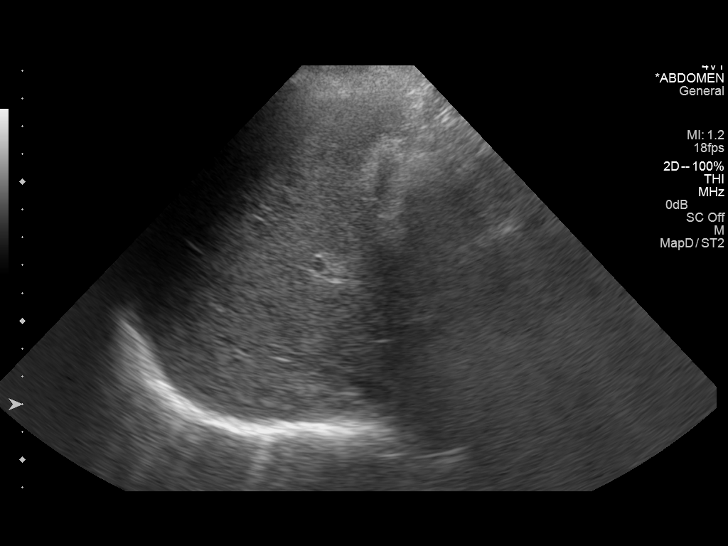
[im 16/39]
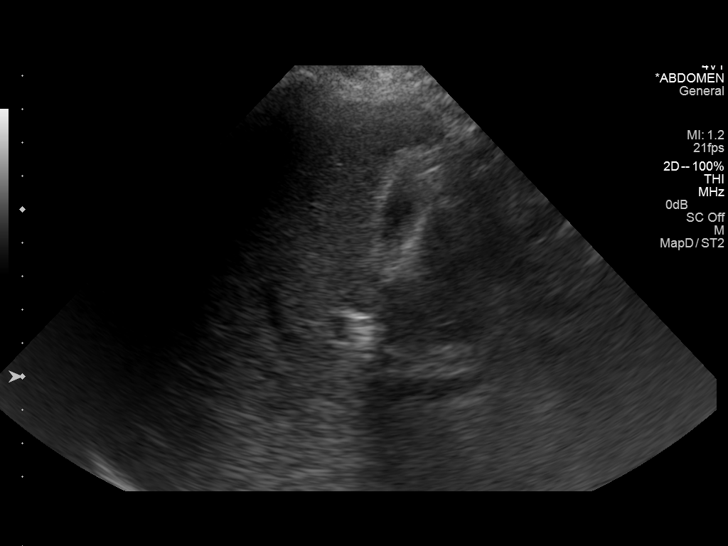
[im 20/39]
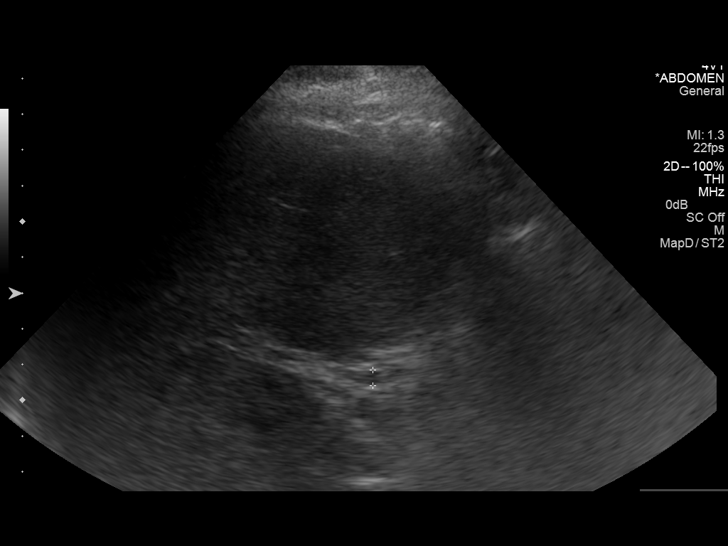
[im 23/39]
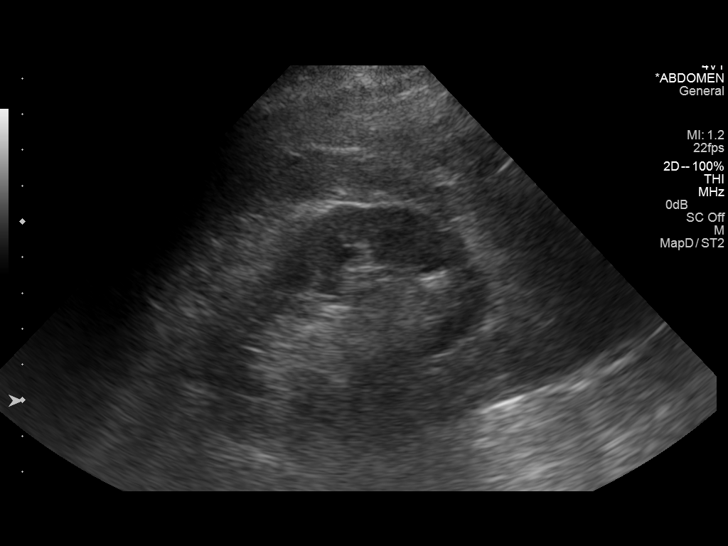
[im 26/39]
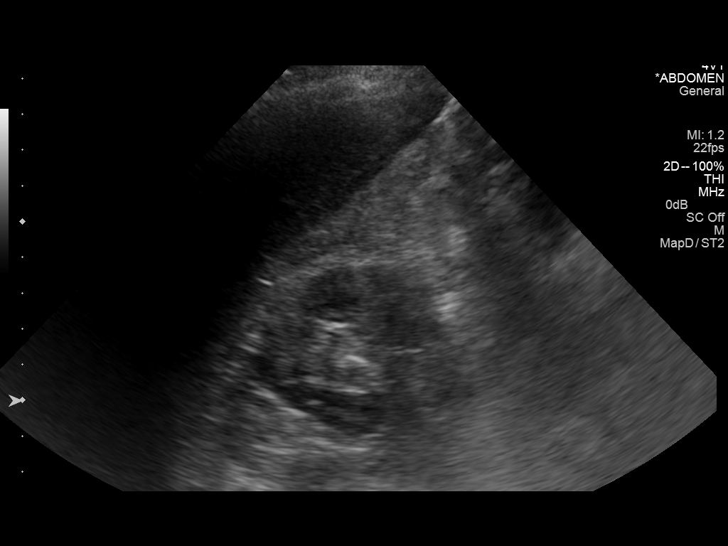
[im 29/39]
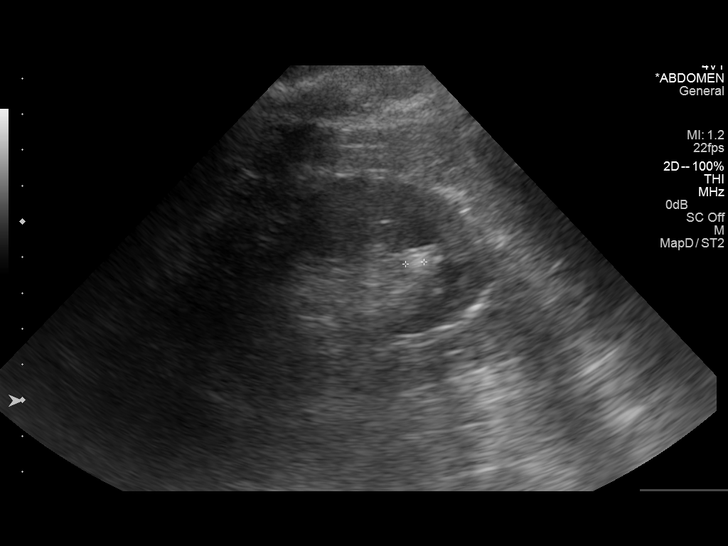
[im 32/39]
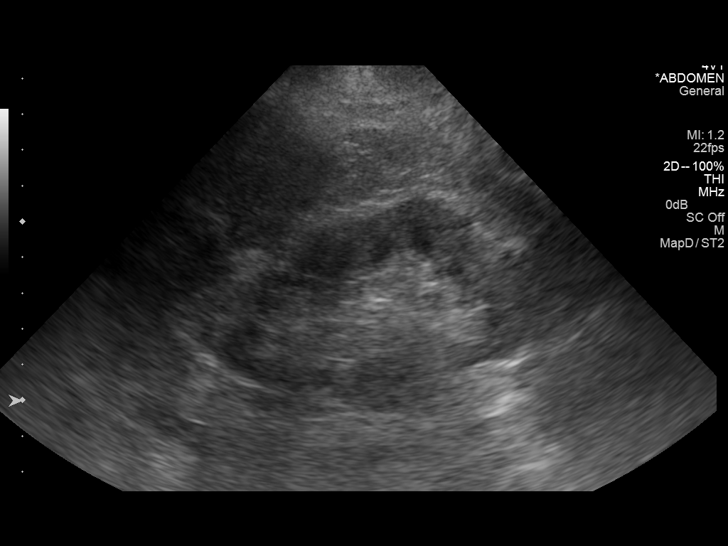
[im 35/39]
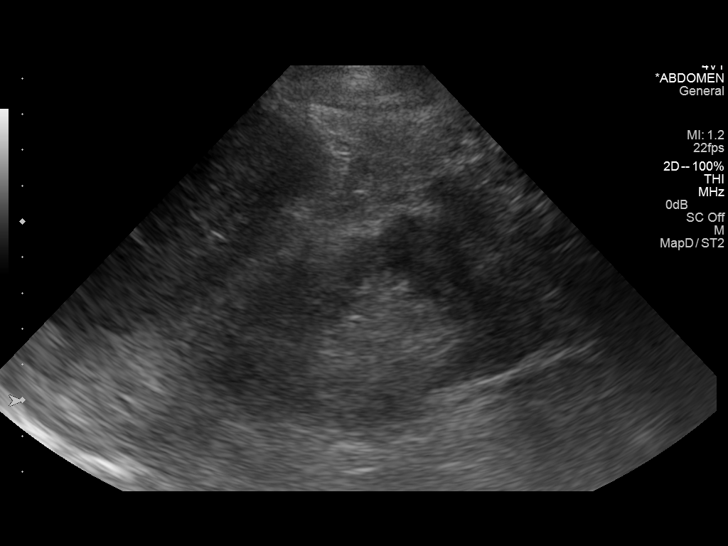
[im 39/39]
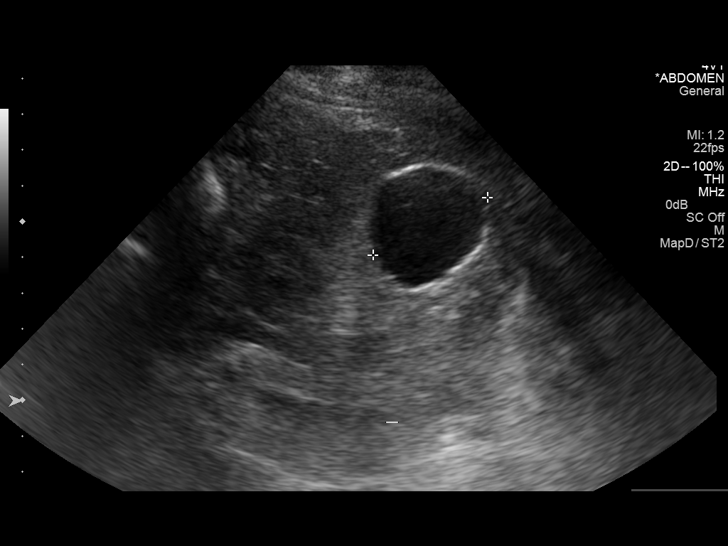

[13 of 25 positions shown; findings below may reference images not displayed]

FINDINGS: Gallbladder: Relatively decompressed and not well assessed. No
evidence for obstruction or cholecystitis. No ultrasonographic
Murphy's sign elicited.

Common bile duct:

Diameter: 0.4 cm, within normal limits in caliber.

Liver: No focal lesion identified. Within normal limits in
parenchymal echogenicity.

IVC: No abnormality visualized.

Pancreas: Visualized portion unremarkable.

Spleen: Size and appearance within normal limits.

Right Kidney:

Length: 9.5 cm. Increased parenchymal echogenicity and mild cortical
thinning noted. A 5 mm stone is again noted at the lower pole of the
right kidney. No mass or hydronephrosis visualized.

Left Kidney:

Length: 10.0 cm. Mildly increased parenchymal echogenicity noted. No
hydronephrosis visualized. A 3.6 cm cyst is noted at the lower pole
of the left kidney; this was seen to be slightly complex on the
prior ultrasound from 2252, but has decreased slightly in size.

Abdominal aorta: No aneurysm visualized. Not well characterized
distally due to overlying structures.

Other findings: None.
IMPRESSION: 1. No acute abnormality seen within the abdomen.
2. Increased renal parenchymal echogenicity raises concern for
medical renal disease. Mild right renal cortical thinning noted.
3. 3.6 cm slightly complex left renal cyst has decreased mildly in
size and is likely benign.
4. 5 mm nonobstructing stone again noted at the lower pole of the
right kidney.

## 2015-07-20 IMAGING — CT CT RENAL STONE PROTOCOL
2 of 4 series · 15 of 46 positions shown, 17 images · non-contrast
Comparison: January 10, 2012

CLINICAL DATA: Right flank and mid abdominal pain

EXAM:
CT ABDOMEN AND PELVIS WITHOUT CONTRAST
TECHNIQUE: Multidetector CT imaging of the abdomen and pelvis was performed
following the standard protocol without oral or intravenous contrast
material administration.

[Series 2: stone study 5.0 i30f 1 · axial · 0.72mm/px · z∈[-469,-64]mm · 12 of 89 slices shown, 14 images]
[im 4/89  soft-tissue]
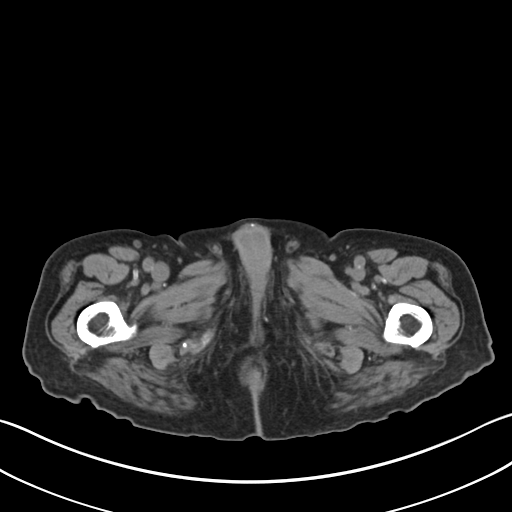
[im 4/89  bone]
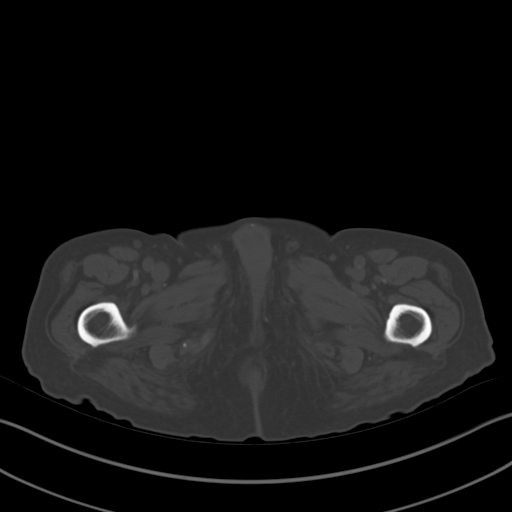
[im 12/89  soft-tissue]
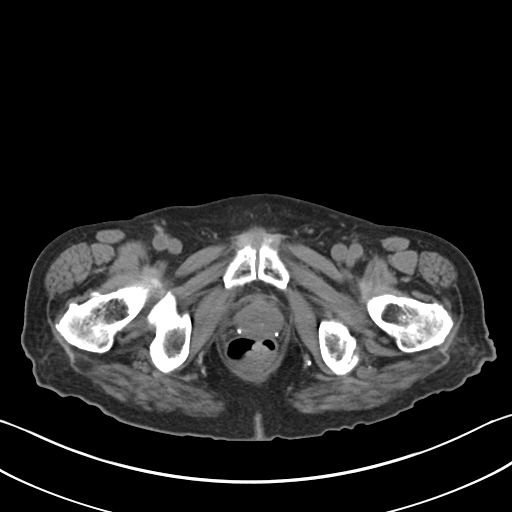
[im 19/89  soft-tissue]
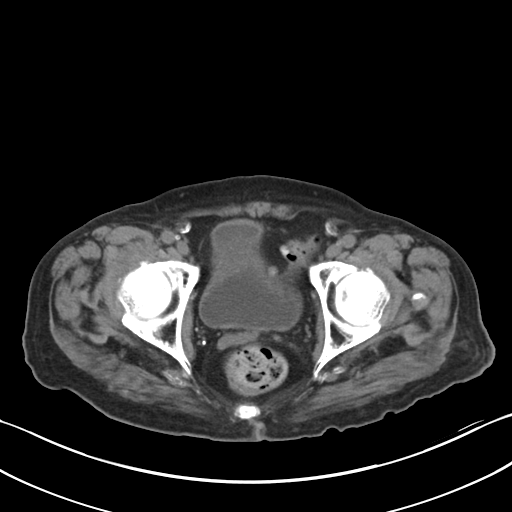
[im 26/89  soft-tissue]
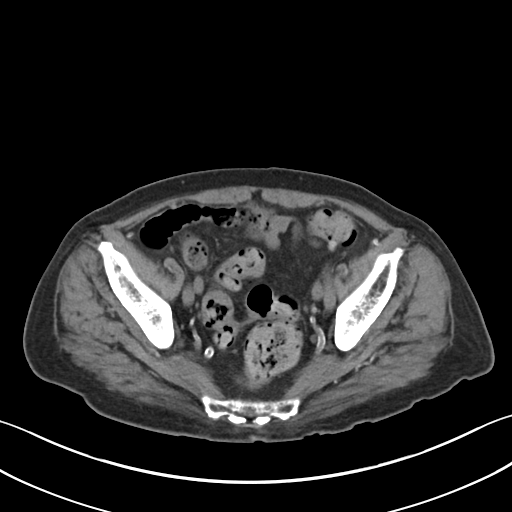
[im 34/89  soft-tissue]
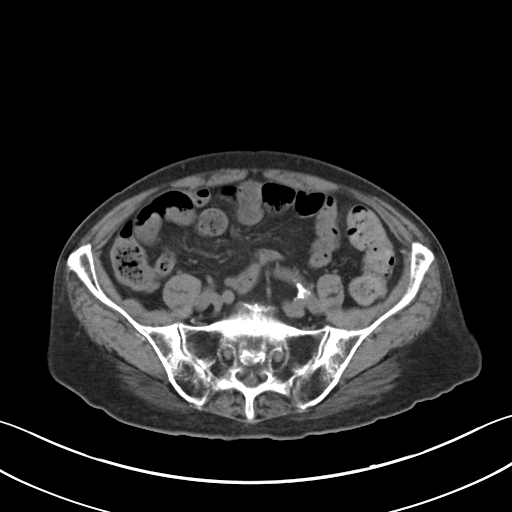
[im 41/89  soft-tissue]
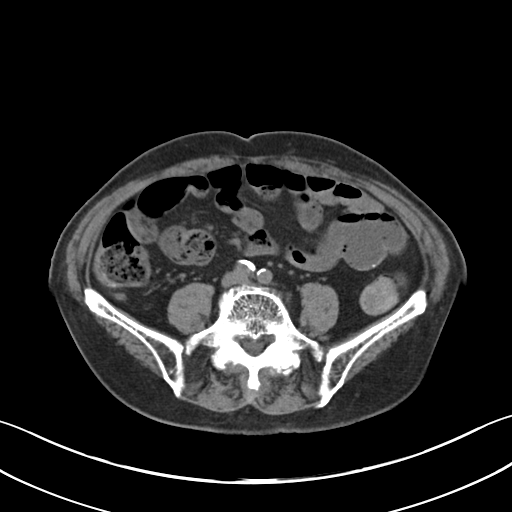
[im 48/89  soft-tissue]
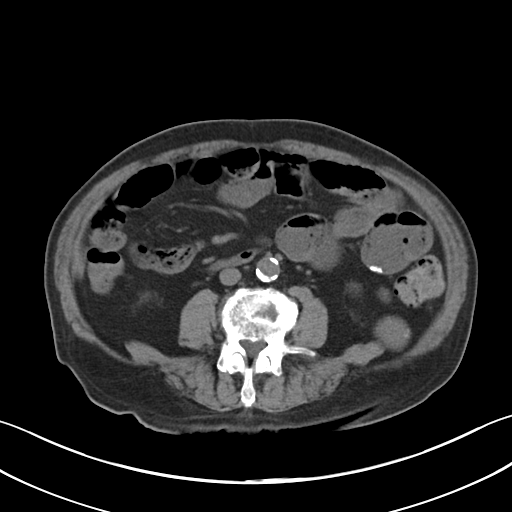
[im 56/89  soft-tissue]
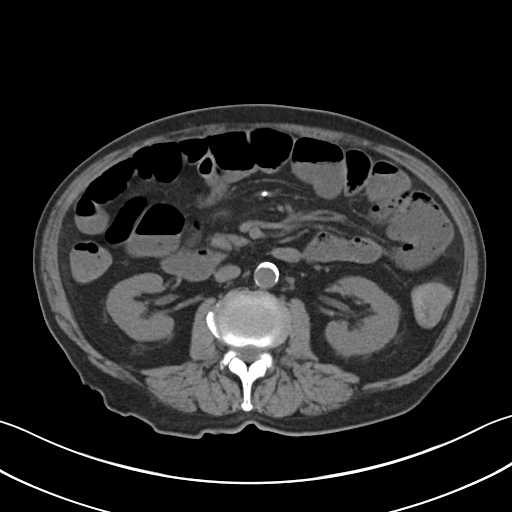
[im 63/89  soft-tissue]
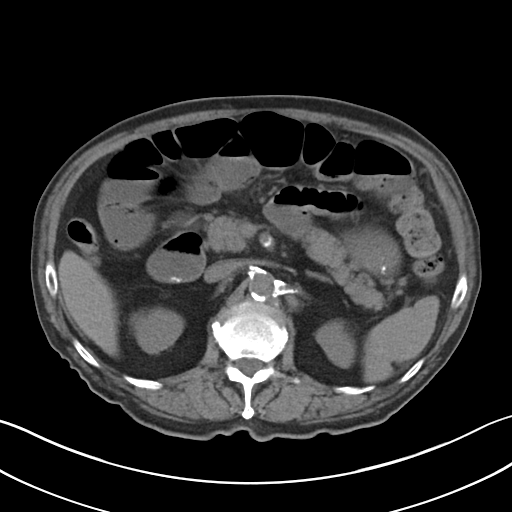
[im 63/89  bone]
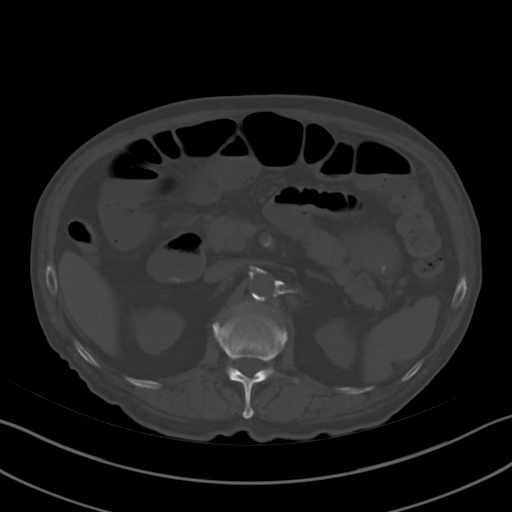
[im 70/89  soft-tissue]
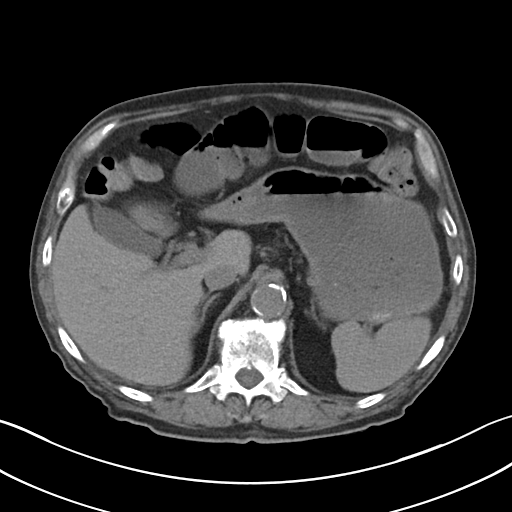
[im 78/89  soft-tissue]
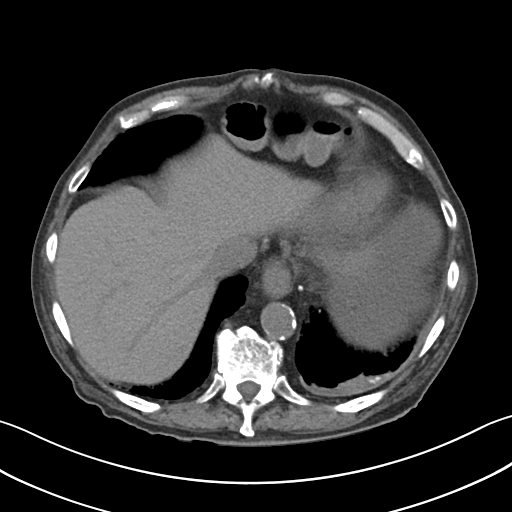
[im 85/89  soft-tissue]
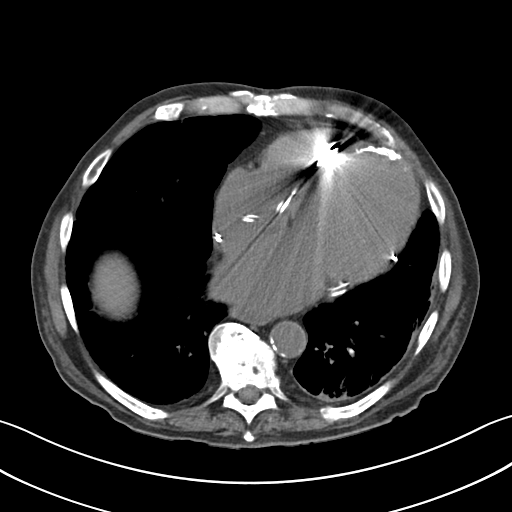

[Series 5: coronal soft tissue · coronal · 0.76mm/px · 3 of 84 slices shown]
[im 28/84  soft-tissue]
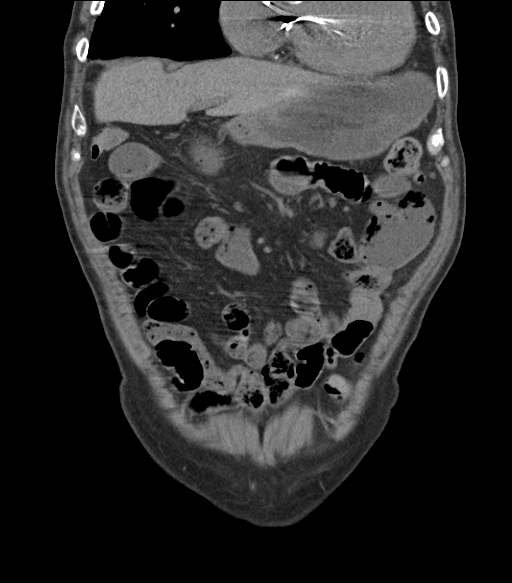
[im 37/84  soft-tissue]
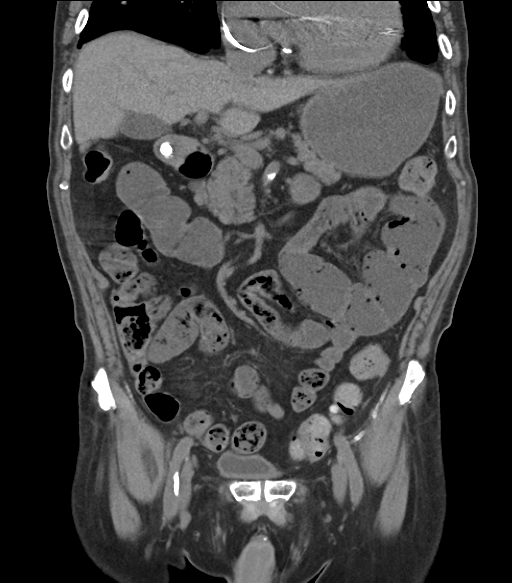
[im 47/84  soft-tissue]
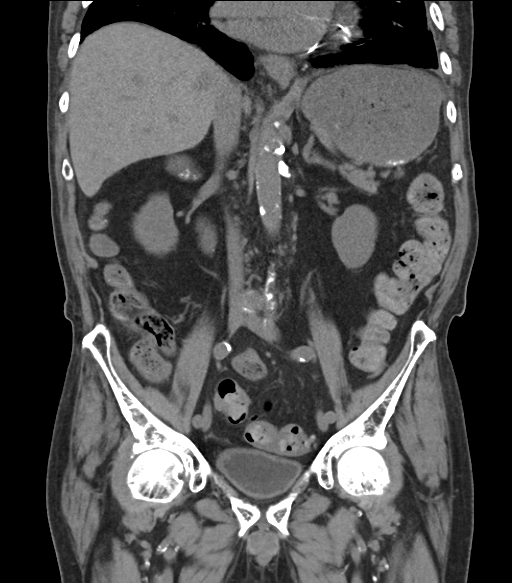

[15 of 46 positions shown; findings below may reference images not displayed]

FINDINGS: There is emphysematous change in the lung bases. There is chronic
scarring and pleural thickening in the left base with parietal
pleural calcification in the left base posteriorly. No new opacity
is identified in the lung bases compared to prior study. There is
cardiac prominence with pacemaker leads attached to the heart,
stable.

No focal liver lesions are identified on this noncontrast enhanced
study. Gallbladder wall is not thickened. There is no appreciable
biliary duct dilatation.

Spleen, pancreas, and adrenals appear normal.

There is a calculus in the lower pole the right kidney measuring 8 x
8 mm in size. No other intrarenal calculi are identified. There is a
mass arising from the lower pole of the left kidney posteriorly and
laterally measuring 3.6 x 3.5 cm. This mass has attenuation values
slightly higher than is expected with a simple cyst. There is
calcification along the peripheral wall of this mass. This mass is
unchanged in size and contour compared to the previous study. It has
marginally higher attenuation values compared to the previous study,
however. No mass mm appreciated in the right kidney. There is no
hydronephrosis on either side. There is no evidence of ureteral
calculus on either side.

In the pelvis, there is mild thickening of the wall of the urinary
bladder. There are small apparent clips near the prostate in the
lower pelvis. There is no pelvic mass or fluid collection. There are
multiple sigmoid diverticula without diverticulitis. Appendix
appears normal.

There is slight generalized bowel dilatation. There is no
appreciable transition zone to suggest obstruction is appreciable.
No free air or portal venous air. Stomach is borderline distended
with fluid.

There is no apparent ascites, adenopathy, or abscess in the abdomen
or pelvis. There is atherosclerotic change in the aorta and iliac
arteries without aneurysm. There is extensive calcification in both
proximal renal arteries. There is also extensive calcification in
the proximal celiac and superior mesenteric arteries. There is no
evidence suggesting bowel ischemia, however. There is stable
anterior wedging of the superior aspect of the L4 vertebral body.
There is extensive lumbar arthropathy. There are no blastic or lytic
bone lesions.
IMPRESSION: Stable lower pole right renal calculus measuring 8 x 8 mm. There is
no ureteral calculus on either side. There is no hydronephrosis on
either side.

Mass arising the lower pole of the left kidney which appears
unchanged in size and contour compared to the previous study but
does have slightly higher attenuation compared to the previous
study. Suspect complex cyst. Particular attention this area on
subsequent evaluations is advised. A followup ultrasound in
approximately 6 months to confirm no change in size of this mass may
be advisable as well.

Suspect a degree of ileus or possible enteritis. No bowel
obstruction. No free air. No bowel pneumatosis to suggest ischemic
change.

Sigmoid diverticulosis without diverticulitis.

Thickening in the urinary bladder wall. Suspect a degree of
cystitis.

Appendix appears normal.

Underlying emphysema with scarring in the lung bases. Stable
parietal pleural thickening and calcification on the left. Suspect
prior asbestos exposure.

Extensive atherosclerotic change in the aorta and major mesenteric
arteries. There is significant calcification in both proximal renal
arteries.

## 2015-07-21 DIAGNOSIS — R3915 Urgency of urination: Secondary | ICD-10-CM | POA: Diagnosis not present

## 2015-07-21 DIAGNOSIS — Z0000 Encounter for general adult medical examination without abnormal findings: Secondary | ICD-10-CM | POA: Diagnosis not present

## 2015-07-21 DIAGNOSIS — J449 Chronic obstructive pulmonary disease, unspecified: Secondary | ICD-10-CM | POA: Diagnosis not present

## 2015-07-21 DIAGNOSIS — C61 Malignant neoplasm of prostate: Secondary | ICD-10-CM | POA: Diagnosis not present

## 2015-07-27 DIAGNOSIS — E119 Type 2 diabetes mellitus without complications: Secondary | ICD-10-CM | POA: Diagnosis not present

## 2015-07-29 DIAGNOSIS — E119 Type 2 diabetes mellitus without complications: Secondary | ICD-10-CM | POA: Diagnosis not present

## 2015-07-29 DIAGNOSIS — I10 Essential (primary) hypertension: Secondary | ICD-10-CM | POA: Diagnosis not present

## 2015-07-29 DIAGNOSIS — I509 Heart failure, unspecified: Secondary | ICD-10-CM | POA: Diagnosis not present

## 2015-07-29 DIAGNOSIS — E118 Type 2 diabetes mellitus with unspecified complications: Secondary | ICD-10-CM | POA: Diagnosis not present

## 2015-07-29 DIAGNOSIS — N400 Enlarged prostate without lower urinary tract symptoms: Secondary | ICD-10-CM | POA: Diagnosis not present

## 2015-07-29 DIAGNOSIS — D649 Anemia, unspecified: Secondary | ICD-10-CM | POA: Diagnosis not present

## 2015-07-29 DIAGNOSIS — I5020 Unspecified systolic (congestive) heart failure: Secondary | ICD-10-CM | POA: Diagnosis not present

## 2015-07-29 DIAGNOSIS — I480 Paroxysmal atrial fibrillation: Secondary | ICD-10-CM | POA: Diagnosis not present

## 2015-08-05 DIAGNOSIS — I639 Cerebral infarction, unspecified: Secondary | ICD-10-CM | POA: Diagnosis not present

## 2015-08-05 DIAGNOSIS — D649 Anemia, unspecified: Secondary | ICD-10-CM | POA: Diagnosis not present

## 2015-08-05 DIAGNOSIS — I5020 Unspecified systolic (congestive) heart failure: Secondary | ICD-10-CM | POA: Diagnosis not present

## 2015-08-05 DIAGNOSIS — E119 Type 2 diabetes mellitus without complications: Secondary | ICD-10-CM | POA: Diagnosis not present

## 2015-08-09 ENCOUNTER — Ambulatory Visit (INDEPENDENT_AMBULATORY_CARE_PROVIDER_SITE_OTHER): Payer: Medicare Other | Admitting: *Deleted

## 2015-08-09 DIAGNOSIS — I482 Chronic atrial fibrillation, unspecified: Secondary | ICD-10-CM

## 2015-08-09 DIAGNOSIS — Z950 Presence of cardiac pacemaker: Secondary | ICD-10-CM

## 2015-08-10 NOTE — Progress Notes (Signed)
Remote pacemaker transmission.   

## 2015-08-21 DIAGNOSIS — J449 Chronic obstructive pulmonary disease, unspecified: Secondary | ICD-10-CM | POA: Diagnosis not present

## 2015-08-25 LAB — CUP PACEART REMOTE DEVICE CHECK
Battery Remaining Longevity: 73 mo
Date Time Interrogation Session: 20170530060014
Implantable Lead Implant Date: 20050706
Implantable Lead Implant Date: 20080423
Implantable Lead Location: 753858
Implantable Lead Model: 1158
Lead Channel Pacing Threshold Amplitude: 0.5 V
Lead Channel Pacing Threshold Pulse Width: 0.8 ms
Lead Channel Sensing Intrinsic Amplitude: 8.4 mV
Lead Channel Setting Pacing Amplitude: 2.5 V
Lead Channel Setting Pacing Amplitude: 2.875
Lead Channel Setting Pacing Pulse Width: 0.5 ms
Lead Channel Setting Pacing Pulse Width: 0.8 ms
MDC IDC LEAD LOCATION: 753860
MDC IDC MSMT BATTERY REMAINING PERCENTAGE: 95.5 %
MDC IDC MSMT BATTERY VOLTAGE: 2.98 V
MDC IDC MSMT LEADCHNL LV IMPEDANCE VALUE: 630 Ohm
MDC IDC MSMT LEADCHNL LV PACING THRESHOLD AMPLITUDE: 1.875 V
MDC IDC MSMT LEADCHNL RV IMPEDANCE VALUE: 510 Ohm
MDC IDC MSMT LEADCHNL RV PACING THRESHOLD PULSEWIDTH: 0.5 ms
MDC IDC SET LEADCHNL RV SENSING SENSITIVITY: 2 mV
Pulse Gen Model: 3222
Pulse Gen Serial Number: 7634311

## 2015-08-30 ENCOUNTER — Encounter: Payer: Self-pay | Admitting: Cardiology

## 2015-09-01 DIAGNOSIS — I482 Chronic atrial fibrillation: Secondary | ICD-10-CM | POA: Diagnosis not present

## 2015-09-20 DIAGNOSIS — J449 Chronic obstructive pulmonary disease, unspecified: Secondary | ICD-10-CM | POA: Diagnosis not present

## 2015-10-21 DIAGNOSIS — J449 Chronic obstructive pulmonary disease, unspecified: Secondary | ICD-10-CM | POA: Diagnosis not present

## 2015-10-26 ENCOUNTER — Observation Stay (HOSPITAL_COMMUNITY)
Admission: EM | Admit: 2015-10-26 | Discharge: 2015-10-27 | Disposition: A | Discharge status: Home / Self Care | Payer: Medicare Other | Attending: Internal Medicine | Admitting: Internal Medicine

## 2015-10-26 ENCOUNTER — Emergency Department (HOSPITAL_COMMUNITY): Payer: Medicare Other

## 2015-10-26 ENCOUNTER — Encounter (HOSPITAL_COMMUNITY): Payer: Self-pay | Admitting: *Deleted

## 2015-10-26 DIAGNOSIS — E119 Type 2 diabetes mellitus without complications: Secondary | ICD-10-CM | POA: Insufficient documentation

## 2015-10-26 DIAGNOSIS — I5022 Chronic systolic (congestive) heart failure: Secondary | ICD-10-CM | POA: Diagnosis not present

## 2015-10-26 DIAGNOSIS — I2510 Atherosclerotic heart disease of native coronary artery without angina pectoris: Secondary | ICD-10-CM | POA: Diagnosis not present

## 2015-10-26 DIAGNOSIS — R079 Chest pain, unspecified: Secondary | ICD-10-CM | POA: Diagnosis not present

## 2015-10-26 DIAGNOSIS — Z8546 Personal history of malignant neoplasm of prostate: Secondary | ICD-10-CM | POA: Diagnosis not present

## 2015-10-26 DIAGNOSIS — J449 Chronic obstructive pulmonary disease, unspecified: Secondary | ICD-10-CM | POA: Insufficient documentation

## 2015-10-26 DIAGNOSIS — Z95810 Presence of automatic (implantable) cardiac defibrillator: Secondary | ICD-10-CM | POA: Insufficient documentation

## 2015-10-26 DIAGNOSIS — Z85118 Personal history of other malignant neoplasm of bronchus and lung: Secondary | ICD-10-CM | POA: Insufficient documentation

## 2015-10-26 DIAGNOSIS — Z87891 Personal history of nicotine dependence: Secondary | ICD-10-CM | POA: Diagnosis not present

## 2015-10-26 DIAGNOSIS — Z955 Presence of coronary angioplasty implant and graft: Secondary | ICD-10-CM | POA: Insufficient documentation

## 2015-10-26 DIAGNOSIS — Z79899 Other long term (current) drug therapy: Secondary | ICD-10-CM | POA: Diagnosis not present

## 2015-10-26 DIAGNOSIS — Z9981 Dependence on supplemental oxygen: Secondary | ICD-10-CM

## 2015-10-26 DIAGNOSIS — Z8673 Personal history of transient ischemic attack (TIA), and cerebral infarction without residual deficits: Secondary | ICD-10-CM | POA: Diagnosis not present

## 2015-10-26 DIAGNOSIS — Z951 Presence of aortocoronary bypass graft: Secondary | ICD-10-CM | POA: Diagnosis not present

## 2015-10-26 DIAGNOSIS — J60 Coalworker's pneumoconiosis: Secondary | ICD-10-CM

## 2015-10-26 DIAGNOSIS — R0789 Other chest pain: Principal | ICD-10-CM | POA: Insufficient documentation

## 2015-10-26 DIAGNOSIS — I110 Hypertensive heart disease with heart failure: Secondary | ICD-10-CM | POA: Diagnosis not present

## 2015-10-26 HISTORY — DX: Bradycardia, unspecified: R00.1

## 2015-10-26 HISTORY — DX: Chronic atrial fibrillation, unspecified: I48.20

## 2015-10-26 HISTORY — DX: Chronic respiratory failure, unspecified whether with hypoxia or hypercapnia: J96.10

## 2015-10-26 LAB — BASIC METABOLIC PANEL
ANION GAP: 10 (ref 5–15)
BUN: 22 mg/dL — ABNORMAL HIGH (ref 6–20)
CO2: 27 mmol/L (ref 22–32)
Calcium: 9.9 mg/dL (ref 8.9–10.3)
Chloride: 100 mmol/L — ABNORMAL LOW (ref 101–111)
Creatinine, Ser: 1.55 mg/dL — ABNORMAL HIGH (ref 0.61–1.24)
GFR calc Af Amer: 47 mL/min — ABNORMAL LOW (ref >60)
GFR, EST NON AFRICAN AMERICAN: 40 mL/min — AB (ref >60)
Glucose, Bld: 109 mg/dL — ABNORMAL HIGH (ref 65–99)
POTASSIUM: 4.8 mmol/L (ref 3.5–5.1)
SODIUM: 137 mmol/L (ref 135–145)

## 2015-10-26 LAB — DIGOXIN LEVEL: Digoxin Level: 1.1 ng/mL (ref 0.8–2.0)

## 2015-10-26 LAB — CBC
HEMATOCRIT: 30 % — AB (ref 39.0–52.0)
HEMOGLOBIN: 9.7 g/dL — AB (ref 13.0–17.0)
MCH: 32.1 pg (ref 26.0–34.0)
MCHC: 32.3 g/dL (ref 30.0–36.0)
MCV: 99.3 fL (ref 78.0–100.0)
Platelets: 174 10*3/uL (ref 150–400)
RBC: 3.02 MIL/uL — ABNORMAL LOW (ref 4.22–5.81)
RDW: 15.6 % — AB (ref 11.5–15.5)
WBC: 2.9 10*3/uL — AB (ref 4.0–10.5)

## 2015-10-26 LAB — BRAIN NATRIURETIC PEPTIDE: B NATRIURETIC PEPTIDE 5: 281.7 pg/mL — AB (ref 0.0–100.0)

## 2015-10-26 LAB — I-STAT TROPONIN, ED: Troponin i, poc: 0.03 ng/mL (ref 0.00–0.08)

## 2015-10-26 NOTE — ED Notes (Signed)
MD at bedside. 

## 2015-10-26 NOTE — ED Provider Notes (Signed)
[facility] DEPT Provider Note   CSN: [id number] Arrival date & time: 10/26/15  1703     History   Chief Complaint Chief Complaint  Patient presents with  . Chest Pain    HPI [name] is a 82 y.o. male.  The history is provided by the patient and medical records.  Chest Pain   Associated symptoms include palpitations.   82 y.o. M with hx of black lung disease on chronic O2 at baseline, CAD, CHF, COPD, GERD, DM, HLP, HTN, prostate cancer s/p seed radiation, presenting to the ED for chest pain.  Patient states this morning he was sitting at home when he developed some sharp pain in the left lateral side of his chest with associated palpitations.  No SOB, dizziness, diaphoresis, nausea, or emesis.  States this lasted for about an hour.  States pain has fully resolved but he has continued feeling palpitations throughout the day today.  States currently they are mild compared with this morning.  States he has been taking off his his medications as directed.  Denies cough, fever, chills, sore throat, or other URI symptoms.  He does have pacemaker in place.  Followed by cardiology, Dr. Lovena Le.  States he occasionally gets chest pain-- maybe once a week or every other week.  States generally he doesn't do or say anything about it because it is mild, however episode today scared him as it was much worse than previous.  Patient currently pain free on arrival to ED.  Past Medical History:  Diagnosis Date  . Arthritis   . Black lung disease ([place])   . CAD (coronary artery disease)    a. 07/2002 CABG x 3: LIMA->LAD, VG->Diag, VG->OM;  b. 06/2006 Cath: LM 50-60ost/p, LAD patent mid stent, D1 sev dzs, D2 patent stent, LCX nl, OM2 sev sten prox, RCA large/nl, VG->Diag nl, VG->OM nl, LIMA->LAD atretic, EF 30%.  . Carotid artery occlusion   . Chronic systolic CHF (congestive heart failure) ([place])    a. 12/2012 Echo: EF 20-25%, mid-dist antsept AK, mod dil LA.  [name] COPD (chronic obstructive  pulmonary disease) ([place])    a. On home O2.  [name] CVA (cerebral vascular accident) ([place])   . Diabetes mellitus   . GERD (gastroesophageal reflux disease)   . Gout   . Hypercholesterolemia   . Hypertension   . Ischemic cardiomyopathy    a. 06/2006 s/p SJM Promote RF, model 3207-36 BiV ICD, ser #: [id number].  . Lung cancer ([place])   . Nephrolithiasis 09/2000  . Prostate cancer ([place]) 11/01/10   gleason 7, 8, 9, gold seeds 02/08/11    Patient Active Problem List   Diagnosis Date Noted  . Chronic respiratory failure ([place]) 08/08/2013  . CAP (community acquired pneumonia) 08/07/2013  . Pneumonia 08/07/2013  . Occlusion and stenosis of carotid artery without mention of cerebral infarction 05/26/2013  . Syncope 02/15/2013  . Weakness generalized 05/14/2011  . Chest pain 05/14/2011  . On home oxygen therapy   . Prostate cancer ([place])   . Pacemaker   . S/P CABG (coronary artery bypass graft)   . Hematochezia 02/19/2011  . Melena 02/19/2011  . Prostate cancer, recur risk not determined whether low, med or high ([place])   . Systolic CHF, chronic ([place]) 02/16/2011  . SBO (small bowel obstruction) ([place]) 02/15/2011  . ARF (acute renal failure) ([place]) 02/15/2011  . UTI (lower urinary tract infection) 02/15/2011  . Syncope and collapse 02/10/2011  . Chest pain on exertion 02/10/2011  . Automatic  implantable cardioverter-defibrillator in situ 05/24/2009  . HEART BLOCK 06/01/2008  . Atrial fibrillation ([place]) 06/01/2008    Past Surgical History:  Procedure Laterality Date  . BI-VENTRICULAR PACEMAKER INSERTION (CRT-P)  12-02-13   downgrade of previously implanted CRTD to STJ CRTP  . BIV PACEMAKER GENERATOR CHANGE OUT N/A 12/02/2013   Procedure: BIV PACEMAKER GENERATOR CHANGE OUT;  Surgeon: Evans Lance, MD;  Location: [facility] CATH LAB;  Service: Cardiovascular;  Laterality: N/A;  . COLONOSCOPY    . CORONARY ANGIOPLASTY WITH STENT PLACEMENT  07/1997; 08/1997;03/1998  . CORONARY ARTERY BYPASS GRAFT  07/2002   CABG  X3  . ESOPHAGOGASTRODUODENOSCOPY  02/19/2011   Procedure: ESOPHAGOGASTRODUODENOSCOPY (EGD);  Surgeon: Jeryl Columbia, MD;  Location: [facility] ENDOSCOPY;  Service: Endoscopy;  Laterality: N/A;  . INCISION AND DRAINAGE OF WOUND  08/2002   right thigh; S/P EVH  . INSERT / REPLACE / [place]; 1992; 01/2000;  . INSERT / REPLACE / REMOVE PACEMAKER  09/2003; 06/2006   w/AICD  . INSERT / REPLACE / REMOVE PACEMAKER  12/2004   pacmaker explant  . SHOULDER ARTHROSCOPY W/ ROTATOR CUFF REPAIR  05/2008   left  . upper endoscpopy         Home Medications    Prior to Admission medications   Medication Sig Start Date End Date Taking? Authorizing Provider  acetaminophen (TYLENOL) 500 MG tablet Take 500 mg by mouth every 6 (six) hours as needed (pain).    Historical Provider, MD  albuterol (PROVENTIL HFA;VENTOLIN HFA) 108 (90 BASE) MCG/ACT inhaler Inhale 2 puffs into the lungs every 4 (four) hours as needed for wheezing or shortness of breath.    Historical Provider, MD  albuterol (PROVENTIL HFA;VENTOLIN HFA) 108 (90 Base) MCG/ACT inhaler Inhale 2 puffs into the lungs every 4 (four) hours as needed for wheezing or shortness of breath. 06/10/15   Shawn C Joy, PA-C  albuterol (PROVENTIL) (2.5 MG/3ML) 0.083% nebulizer solution Take 2.5 mg by nebulization every 6 (six) hours as needed for wheezing or shortness of breath.    Historical Provider, MD  allopurinol (ZYLOPRIM) 300 MG tablet Take 300 mg by mouth daily.      Historical Provider, MD  azithromycin (ZITHROMAX) 250 MG tablet Take 1 tablet (250 mg total) by mouth daily. Take first 2 tablets together, then 1 every day until finished. 06/10/15   Shawn C Joy, PA-C  benzonatate (TESSALON) 100 MG capsule Take 1 capsule (100 mg total) by mouth every 8 (eight) hours. 06/10/15   Shawn C Joy, PA-C  CALCIUM-VITAMIN D PO Take 1 tablet by mouth 2 (two) times daily.     Historical Provider, MD  digoxin (LANOXIN) 0.125 MG tablet Take 125 mcg by mouth daily.       Historical Provider, MD  furosemide (LASIX) 20 MG tablet Take 20 mg by mouth daily.    Historical Provider, MD  hydrocortisone cream 1 % Apply to affected area 2 times daily Patient taking differently: Apply 1 application topically 2 (two) times daily as needed for itching. Apply to affected area 2 times daily 10/05/14   Fransico Meadow, PA-C  isosorbide mononitrate (IMDUR) 30 MG 24 hr tablet Take 30 mg by mouth daily. 07/27/13   Historical Provider, MD  lisinopril (PRINIVIL,ZESTRIL) 10 MG tablet Take 10 mg by mouth daily.    Historical Provider, MD  metoprolol succinate (TOPROL-XL) 25 MG 24 hr tablet Take 25 mg by mouth daily. 07/27/13   Historical Provider, MD  nitroGLYCERIN (NITROSTAT) 0.4 MG SL  tablet Place 0.4 mg under the tongue every 5 (five) minutes as needed for chest pain.    Historical Provider, MD  Omega-3 Fatty Acids (FISH OIL) 1000 MG CAPS Take 1,000 mg by mouth 2 (two) times daily.     Historical Provider, MD  ondansetron (ZOFRAN ODT) 4 MG disintegrating tablet Take 1 tablet (4 mg total) by mouth every 8 (eight) hours as needed for nausea or vomiting. 06/10/15   Shawn C Joy, PA-C  OXYGEN Inhale 2.5 L into the lungs continuous.    Historical Provider, MD  predniSONE (DELTASONE) 20 MG tablet Take 2 tablets (40 mg total) by mouth daily with breakfast. 06/10/15   Shawn C Joy, PA-C  vitamin B-12 (CYANOCOBALAMIN) 1000 MCG tablet Take 1,000 mcg by mouth 2 (two) times daily.    Historical Provider, MD  XARELTO 20 MG TABS tablet Take 20 mg by mouth daily with supper.  10/29/14   Historical Provider, MD    Family History Family History  Problem Relation Age of Onset  . Alzheimer's disease Father 58  . Cancer Father 44    metastatic prostate cancer  . Diabetes Sister 15  . Diabetes Brother   . Diabetes Brother   . Diabetes Brother   . Hypotension Neg Hx   . Malignant hyperthermia Neg Hx   . Pseudochol deficiency Neg Hx     Social History Social History  Substance Use Topics  . Smoking  status: Former Smoker    Types: Cigarettes    Quit date: 03/12/1949  . Smokeless tobacco: Current User    Types: Chew  . Alcohol use No     Comment: drank until per pt 03/19/11     Allergies   Aspirin   Review of Systems Review of Systems  Cardiovascular: Positive for chest pain and palpitations.  All other systems reviewed and are negative.    Physical Exam Updated Vital Signs BP 126/74   Pulse 61   Temp 97.9 F (36.6 C)   Resp 20   Ht '5\' 6"'$  (1.676 m)   Wt 61.2 kg   SpO2 100%   BMI 21.79 kg/m   Physical Exam  Constitutional: He is oriented to person, place, and time. He appears well-developed and well-nourished.  HENT:  Head: Normocephalic and atraumatic.  Mouth/Throat: Oropharynx is clear and moist.  Eyes: Conjunctivae and EOM are normal. Pupils are equal, round, and reactive to light.  Neck: Normal range of motion.  Cardiovascular: Normal rate, regular rhythm and normal heart sounds.   Pulmonary/Chest: Effort normal and breath sounds normal.  Pacemaker left chest wall Chronic O2 in use, no distress, able to speak in full sentences without difficulty  Abdominal: Soft. Bowel sounds are normal.  Musculoskeletal: Normal range of motion.  No pitting edema No calf swelling, tenderness, or skin discoloration  Neurological: He is alert and oriented to person, place, and time.  Skin: Skin is warm and dry.  Psychiatric: He has a normal mood and affect.  Nursing note and vitals reviewed.    ED Treatments / Results  Labs (all labs ordered are listed, but only abnormal results are displayed) Labs Reviewed  BASIC METABOLIC PANEL - Abnormal; Notable for the following:       Result Value   Chloride 100 (*)    Glucose, Bld 109 (*)    BUN 22 (*)    Creatinine, Ser 1.55 (*)    GFR calc non Af Amer 40 (*)    GFR calc Af Amer 47 (*)  All other components within normal limits  CBC - Abnormal; Notable for the following:    WBC 2.9 (*)    RBC 3.02 (*)    Hemoglobin  9.7 (*)    HCT 30.0 (*)    RDW 15.6 (*)    All other components within normal limits  BRAIN NATRIURETIC PEPTIDE - Abnormal; Notable for the following:    B Natriuretic Peptide 281.7 (*)    All other components within normal limits  MRSA PCR SCREENING  DIGOXIN LEVEL  TROPONIN I  TROPONIN I  TROPONIN I  I-STAT TROPOININ, ED    EKG  EKG Interpretation  Date/Time:  Wednesday October 26 2015 17:07:46 EDT Ventricular Rate:  61 PR Interval:    QRS Duration: 170 QT Interval:  426 QTC Calculation: 428 R Axis:   133 Text Interpretation:  Ventricular-paced rhythm Biventricular pacemaker detected Abnormal ECG No acute changes Confirmed by LIU MD, DANA [phone]) on 10/26/2015 10:23:04 PM       Radiology Dg Chest 2 View  Result Date: 10/26/2015 CLINICAL DATA:  Acute onset of generalized chest pain. Initial encounter. EXAM: CHEST  2 VIEW COMPARISON:  Chest radiograph performed 06/10/2015 FINDINGS: A small left pleural effusion is noted. Vascular congestion is noted. Chronically increased interstitial lung markings may reflect mild interstitial lung disease. No pneumothorax is seen. The heart is normal in size. The patient is status post median sternotomy, with evidence of prior CABG. A pacemaker/AICD is noted overlying the left chest wall, with leads ending overlying the right ventricle and coronary sinus. An orphaned lead is noted overlying the right chest wall. No acute osseous abnormalities are seen. IMPRESSION: Small left pleural effusion noted. Vascular congestion seen. Chronically increased interstitial markings may reflect mild interstitial lung disease. Electronically Signed   By: Garald Balding M.D.   On: 10/26/2015 21:21    Procedures Procedures (including critical care time)  Medications Ordered in ED Medications - No data to display   Initial Impression / Assessment and Plan / ED Course  I have reviewed the triage vital signs and the nursing notes.  Pertinent labs & imaging  results that were available during my care of the patient were reviewed by me and considered in my medical decision making (see chart for details).  Clinical Course   82 y.o. M here with episode of chest pain this morning with palpitations.  Pain has resolved, palpitations continue but much less severe than earlier.  Patient afebrile, non-toxic.  VSS.  Breathing is at his baseline, on his normal O2.  Labs thus far reassuring.  CXR with evidence of small pleural effusion, vascular congestion noted.  Will add on BNP, digoxin.  Given his palpitation, will interrogate his pacemaker (st. Jude).  BNP 281. Dig level normal.  Patient remains without any recurrence of chest pain.  No recent echo or stress test within the past few years.  HEART score of 4.  Feel it is reasonable to admit for observation and chest pain rule out.  Patient admitted to hospitalist service.  Temp admit orders placed.    Final Clinical Impressions(s) / ED Diagnoses   Final diagnoses:  Chest pain, unspecified chest pain type    New Prescriptions Current Discharge Medication List       Larene Pickett, PA-C 10/27/15 Lipan Liu, MD 10/27/15 1328

## 2015-10-26 NOTE — ED Notes (Signed)
PA at bedside.

## 2015-10-26 NOTE — ED Triage Notes (Signed)
Pt c/o L sided CP onset this morning @ 6am lasting a hour, pt reports always having some cp, pt has permanent pacemaker with last replacement x2 yrs ago, pt denies n/v/d, pt uses O2 [place] at baseline 2.5 L, A&O x4

## 2015-10-26 NOTE — H&P (Signed)
History and Physical    [name] FGH:[id number] DOB: September 19, 1932 DOA: 10/26/2015  PCP: [name] Gravel, MD Consultants:  [name] - cardiology; oncology Patient coming from: home - lives with daughter; [name]: daughter, [name], [phone]  Chief Complaint: chest pain  HPI: [name] is a 82 y.o. male with medical history significant of ASCVD with h/o CABG, black lung disease on home O2, and remote h/o prostate cancer presenting with chest pain.  Patient reports that he was sleeping in the bed with his hand over his heart.  Suddenly his hand was pulsating hard.  Stood up and got very dizzy.  +chest pain - still hurting a little bit, currently 6/10.  Pain comes and goes.  Mild diaphoresis.  No nausea.  No h/o similar CP - not similar to index symptoms prior.  Unsure why last stress test was or last cath was.  Pacemaker put in x 6, most recently last year.    ED Course: Per [name]: 82 y.o. M here with episode of chest pain this morning with palpitations.  Pain has resolved, palpitations continue but much less severe than earlier.  Patient afebrile, non-toxic.  VSS.  Breathing is at his baseline, on his normal O2.  Labs thus far reassuring.  CXR with evidence of small pleural effusion, vascular congestion noted.  Will add on BNP, digoxin.  Given his palpitation, will interrogate his pacemaker (st. Jude).  BNP 281. Dig level normal.  Patient remains without any recurrence of chest pain.  No recent echo or stress test within the past few years.  HEART score of 4.  Feel it is reasonable to admit for observation and chest pain rule out.  Patient admitted to hospitalist service.  Temp admit orders placed.    Review of Systems: As per HPI; otherwise 10 point review of systems reviewed and negative.   Ambulatory Status:  Ambulates independently  Past Medical History:  Diagnosis Date  . Arthritis   . Black lung disease ([place])   . CAD (coronary artery disease)    a. 07/2002 CABG x 3: LIMA->LAD,  VG->Diag, VG->OM;  b. 06/2006 Cath: LM 50-60ost/p, LAD patent mid stent, D1 sev dzs, D2 patent stent, LCX nl, OM2 sev sten prox, RCA large/nl, VG->Diag nl, VG->OM nl, LIMA->LAD atretic, EF 30%.  . Carotid artery occlusion   . Chronic systolic CHF (congestive heart failure) ([place])    a. 12/2012 Echo: EF 20-25%, mid-dist antsept AK, mod dil LA.  [name] COPD (chronic obstructive pulmonary disease) ([place])    a. On home O2.  [name] CVA (cerebral vascular accident) ([place])   . Diabetes mellitus   . GERD (gastroesophageal reflux disease)   . Gout   . Hypercholesterolemia   . Hypertension   . Ischemic cardiomyopathy    a. 06/2006 s/p SJM Promote RF, model 3207-36 BiV ICD, ser #: [id number].  . Lung cancer ([place])   . Nephrolithiasis 09/2000  . Prostate cancer ([place]) 11/01/10   gleason 7, 8, 9, gold seeds 02/08/11    Past Surgical History:  Procedure Laterality Date  . BI-VENTRICULAR PACEMAKER INSERTION (CRT-P)  12-02-13   downgrade of previously implanted CRTD to STJ CRTP  . BIV PACEMAKER GENERATOR CHANGE OUT N/A 12/02/2013   Procedure: BIV PACEMAKER GENERATOR CHANGE OUT;  Surgeon: [name] Lance, MD;  Location: [facility] CATH LAB;  Service: Cardiovascular;  Laterality: N/A;  . COLONOSCOPY    . CORONARY ANGIOPLASTY WITH STENT PLACEMENT  07/1997; 08/1997;03/1998  . CORONARY ARTERY BYPASS GRAFT  07/2002  CABG X3  . ESOPHAGOGASTRODUODENOSCOPY  02/19/2011   Procedure: ESOPHAGOGASTRODUODENOSCOPY (EGD);  Surgeon: [name] Columbia, MD;  Location: [facility] ENDOSCOPY;  Service: Endoscopy;  Laterality: N/A;  . INCISION AND DRAINAGE OF WOUND  08/2002   right thigh; S/P EVH  . INSERT / REPLACE / [place]; 1992; 01/2000;  . INSERT / REPLACE / REMOVE PACEMAKER  09/2003; 06/2006   w/AICD  . INSERT / REPLACE / REMOVE PACEMAKER  12/2004   pacmaker explant  . SHOULDER ARTHROSCOPY W/ ROTATOR CUFF REPAIR  05/2008   left  . upper endoscpopy      Social History   Social History  . Marital status: Widowed    Spouse name: N/A  . Number  of children: N/A  . Years of education: N/A   Occupational History  . retired Retired    Equities trader   Social History Main Topics  . Smoking status: Former Smoker    Types: Cigarettes    Quit date: 03/12/1949  . Smokeless tobacco: Current User    Types: Chew  . Alcohol use No     Comment: drank until per pt 03/19/11, h/o heavy use  . Drug use: No  . Sexual activity: Yes   Other Topics Concern  . Not on file   Social History Narrative  . No narrative on file    Allergies  Allergen Reactions  . Aspirin Hives    Family History  Problem Relation Age of Onset  . Alzheimer's disease Father 75  . Cancer Father 76    metastatic prostate cancer  . Diabetes Sister 93  . Diabetes Brother   . Diabetes Brother   . Diabetes Brother   . Hypotension Neg Hx   . Malignant hyperthermia Neg Hx   . Pseudochol deficiency Neg Hx     Prior to Admission medications   Medication Sig Start Date End Date Taking? Authorizing Provider  acetaminophen (TYLENOL) 500 MG tablet Take 500 mg by mouth every 6 (six) hours as needed (pain).    Historical Provider, MD  albuterol (PROVENTIL HFA;VENTOLIN HFA) 108 (90 BASE) MCG/ACT inhaler Inhale 2 puffs into the lungs every 4 (four) hours as needed for wheezing or shortness of breath.    Historical Provider, MD  albuterol (PROVENTIL HFA;VENTOLIN HFA) 108 (90 Base) MCG/ACT inhaler Inhale 2 puffs into the lungs every 4 (four) hours as needed for wheezing or shortness of breath. 06/10/15   [name] C Joy, PA-C  albuterol (PROVENTIL) (2.5 MG/3ML) 0.083% nebulizer solution Take 2.5 mg by nebulization every 6 (six) hours as needed for wheezing or shortness of breath.    Historical Provider, MD  allopurinol (ZYLOPRIM) 300 MG tablet Take 300 mg by mouth daily.      Historical Provider, MD  azithromycin (ZITHROMAX) 250 MG tablet Take 1 tablet (250 mg total) by mouth daily. Take first 2 tablets together, then 1 every day until finished. 06/10/15   [name] C Joy, PA-C    benzonatate (TESSALON) 100 MG capsule Take 1 capsule (100 mg total) by mouth every 8 (eight) hours. 06/10/15   [name] C Joy, PA-C  CALCIUM-VITAMIN D PO Take 1 tablet by mouth 2 (two) times daily.     Historical Provider, MD  digoxin (LANOXIN) 0.125 MG tablet Take 125 mcg by mouth daily.      Historical Provider, MD  furosemide (LASIX) 20 MG tablet Take 20 mg by mouth daily.    Historical Provider, MD  hydrocortisone cream 1 % Apply to affected area 2  times daily Patient taking differently: Apply 1 application topically 2 (two) times daily as needed for itching. Apply to affected area 2 times daily 10/05/14   Fransico Meadow, PA-C  isosorbide mononitrate (IMDUR) 30 MG 24 hr tablet Take 30 mg by mouth daily. 07/27/13   Historical Provider, MD  lisinopril (PRINIVIL,ZESTRIL) 10 MG tablet Take 10 mg by mouth daily.    Historical Provider, MD  metoprolol succinate (TOPROL-XL) 25 MG 24 hr tablet Take 25 mg by mouth daily. 07/27/13   Historical Provider, MD  nitroGLYCERIN (NITROSTAT) 0.4 MG SL tablet Place 0.4 mg under the tongue every 5 (five) minutes as needed for chest pain.    Historical Provider, MD  Omega-3 Fatty Acids (FISH OIL) 1000 MG CAPS Take 1,000 mg by mouth 2 (two) times daily.     Historical Provider, MD  ondansetron (ZOFRAN ODT) 4 MG disintegrating tablet Take 1 tablet (4 mg total) by mouth every 8 (eight) hours as needed for nausea or vomiting. 06/10/15   [name] C Joy, PA-C  OXYGEN Inhale 2.5 L into the lungs continuous.    Historical Provider, MD  predniSONE (DELTASONE) 20 MG tablet Take 2 tablets (40 mg total) by mouth daily with breakfast. 06/10/15   [name] C Joy, PA-C  vitamin B-12 (CYANOCOBALAMIN) 1000 MCG tablet Take 1,000 mcg by mouth 2 (two) times daily.    Historical Provider, MD  XARELTO 20 MG TABS tablet Take 20 mg by mouth daily with supper.  10/29/14   Historical Provider, MD    Physical Exam: Vitals:   10/26/15 2205 10/27/15 0000 10/27/15 0140 10/27/15 0204  BP:  133/84 130/67    Pulse:  (!) 59 63   Resp:  16 18   Temp:   98.1 F (36.7 C)   TempSrc:   Oral   SpO2: 100% 98% 100%   Weight:   57.4 kg (126 lb 9.6 oz) 57.4 kg (126 lb 9.6 oz)  Height:   '5\' 6"'$  (1.676 m) '5\' 6"'$  (1.676 m)     General:  Appears calm and comfortable and is NAD Eyes:  PERRL, EOMI, normal lids, iris ENT:  grossly normal hearing, lips & tongue, mmm Neck:  no LAD, masses or thyromegaly Cardiovascular:  RRR, no m/r/g. No [name] edema. Pacemaker noted. Respiratory:  CTA bilaterally, no w/r/r. Normal respiratory effort. Abdomen:  soft, ntnd, NABS Skin:  no rash or induration seen on limited exam Musculoskeletal: grossly normal tone BUE/BLE, good ROM, no bony abnormality Psychiatric:  grossly normal mood and affect, speech fluent and appropriate, AOx3 Neurologic:  CN 2-12 grossly intact, moves all extremities in coordinated fashion, sensation intact  Labs on Admission: I have personally reviewed following labs and imaging studies  CBC:  Recent Labs Lab 10/26/15 1733  WBC 2.9*  HGB 9.7*  HCT 30.0*  MCV 99.3  PLT 119   Basic Metabolic Panel:  Recent Labs Lab 10/26/15 1733  NA 137  K 4.8  CL 100*  CO2 27  GLUCOSE 109*  BUN 22*  CREATININE 1.55*  CALCIUM 9.9   GFR: Estimated Creatinine Clearance: 30.3 mL/min (by C-G formula based on SCr of 1.55 mg/dL). Liver Function Tests: No results for input(s): AST, ALT, ALKPHOS, BILITOT, PROT, ALBUMIN in the last 168 hours. No results for input(s): LIPASE, AMYLASE in the last 168 hours. No results for input(s): AMMONIA in the last 168 hours. Coagulation Profile: No results for input(s): INR, PROTIME in the last 168 hours. Cardiac Enzymes:  Recent Labs Lab 10/27/15 0346  TROPONINI  0.03*   BNP (last 3 results) No results for input(s): PROBNP in the last 8760 hours. HbA1C: No results for input(s): HGBA1C in the last 72 hours. CBG: No results for input(s): GLUCAP in the last 168 hours. Lipid Profile: No results for input(s):  CHOL, HDL, LDLCALC, TRIG, CHOLHDL, LDLDIRECT in the last 72 hours. Thyroid Function Tests: No results for input(s): TSH, T4TOTAL, FREET4, T3FREE, THYROIDAB in the last 72 hours. Anemia Panel: No results for input(s): VITAMINB12, FOLATE, FERRITIN, TIBC, IRON, RETICCTPCT in the last 72 hours. Urine analysis:    Component Value Date/Time   COLORURINE YELLOW 11/10/2014 1815   APPEARANCEUR CLEAR 11/10/2014 1815   LABSPEC 1.025 11/10/2014 1815   LABSPEC 1.015 05/03/2011 0950   PHURINE 7.0 11/10/2014 1815   GLUCOSEU NEGATIVE 11/10/2014 1815   HGBUR NEGATIVE 11/10/2014 1815   BILIRUBINUR NEGATIVE 11/10/2014 1815   BILIRUBINUR Color Interference 05/03/2011 0950   KETONESUR NEGATIVE 11/10/2014 1815   PROTEINUR 30 (A) 11/10/2014 1815   UROBILINOGEN 0.2 11/10/2014 1815   NITRITE NEGATIVE 11/10/2014 1815   LEUKOCYTESUR NEGATIVE 11/10/2014 1815   LEUKOCYTESUR Color Interference 05/03/2011 0950    Creatinine Clearance: Estimated Creatinine Clearance: 30.3 mL/min (by C-G formula based on SCr of 1.55 mg/dL).  Sepsis Labs: '@LABRCNTIP'$ (procalcitonin:4,lacticidven:4) )No results found for this or any previous visit (from the past 240 hour(s)).   Radiological Exams on Admission: Dg Chest 2 View  Result Date: 10/26/2015 CLINICAL DATA:  Acute onset of generalized chest pain. Initial encounter. EXAM: CHEST  2 VIEW COMPARISON:  Chest radiograph performed 06/10/2015 FINDINGS: A small left pleural effusion is noted. Vascular congestion is noted. Chronically increased interstitial lung markings may reflect mild interstitial lung disease. No pneumothorax is seen. The heart is normal in size. The patient is status post median sternotomy, with evidence of prior CABG. A pacemaker/AICD is noted overlying the left chest wall, with leads ending overlying the right ventricle and coronary sinus. An orphaned lead is noted overlying the right chest wall. No acute osseous abnormalities are seen. IMPRESSION: Small left  pleural effusion noted. Vascular congestion seen. Chronically increased interstitial markings may reflect mild interstitial lung disease. Electronically Signed   By: Garald Balding M.D.   On: 10/26/2015 21:21    EKG: Independently reviewed.  Ventricular paced rhythm with rate 61;  no evidence of acute ischemia  Assessment/Plan Principal Problem:   Chest pain Active Problems:   Systolic CHF, chronic (HCC)   S/P CABG (coronary artery bypass graft)   On home oxygen therapy   Black lung disease (HCC)   Chest pain -Patient with left-sided chest pain that came on at rest and improved spontaneously but is recurrent -Symptoms suggestive of atypical vs. Noncardiac chest pain - but patient is high risk due to prior h/o CAD s/p remote CABG -CXR remarkable for possible volume overload, but patient is without symptoms of CHF exacerbation  -Initial cardiac enzymes negative.   -EKG not indicative of acute ischemia.   -TIMI risk score is 3; which predicts a 14 days risk of death, recurrent MI, or urgent revascularization of 13.2%.  -Will plan to place in observation status on telemetry to rule out ACS by overnight observation.  -cycle cardiac enzymes q6h x 3 and repeat EKG in AM -Patient reports allergy to ASA -morphine, statin (new medication - uncertain why patient wasn't previously taking), NTG given; takes Xarelto daily -Risk factor stratification with FLP and HgbA1c; will also check TSH and UDS -MPS in AM -Cardiology consultation in AM - Dr. Tanna Furry group notified  via Inbox message to the [place] for discharge to home tomorrow after stress testing if tests are negative for ischemia  HFrEF -last Echo appears to have been in 2014 and showed EF 20-25% -Will continue ACE, BB, Imdur, Digoxin -Will defer question of repeat Echo to cardiology   Black lung disease -Continue home O2 and daily Prednisone  DVT prophylaxis: Xarelto Code Status: DNR - confirmed with patient/family Family  Communication: None present Disposition Plan:  Home once clinically improved Consults called: Cardiolgy (via cardsmaster)  Admission status: Observation, telemetry    Karmen Bongo MD [facility]  If 7PM-7AM, please contact night-coverage [email or web address] Password [facility]  10/27/2015, 4:38 AM

## 2015-10-27 ENCOUNTER — Observation Stay (HOSPITAL_COMMUNITY): Payer: Medicare Other

## 2015-10-27 ENCOUNTER — Observation Stay (HOSPITAL_BASED_OUTPATIENT_CLINIC_OR_DEPARTMENT_OTHER): Payer: Medicare Other

## 2015-10-27 ENCOUNTER — Encounter: Payer: Self-pay | Admitting: Internal Medicine

## 2015-10-27 ENCOUNTER — Encounter (HOSPITAL_COMMUNITY): Payer: Self-pay | Admitting: Internal Medicine

## 2015-10-27 DIAGNOSIS — R072 Precordial pain: Secondary | ICD-10-CM

## 2015-10-27 DIAGNOSIS — Z951 Presence of aortocoronary bypass graft: Secondary | ICD-10-CM

## 2015-10-27 DIAGNOSIS — R079 Chest pain, unspecified: Secondary | ICD-10-CM

## 2015-10-27 DIAGNOSIS — I5022 Chronic systolic (congestive) heart failure: Secondary | ICD-10-CM

## 2015-10-27 DIAGNOSIS — R0789 Other chest pain: Secondary | ICD-10-CM | POA: Diagnosis not present

## 2015-10-27 DIAGNOSIS — J60 Coalworker's pneumoconiosis: Secondary | ICD-10-CM | POA: Diagnosis present

## 2015-10-27 DIAGNOSIS — I25812 Atherosclerosis of bypass graft of coronary artery of transplanted heart without angina pectoris: Secondary | ICD-10-CM

## 2015-10-27 LAB — IRON AND TIBC
IRON: 78 ug/dL (ref 45–182)
Saturation Ratios: 26 % (ref 17.9–39.5)
TIBC: 298 ug/dL (ref 250–450)
UIBC: 220 ug/dL

## 2015-10-27 LAB — COMPREHENSIVE METABOLIC PANEL
ALBUMIN: 3.6 g/dL (ref 3.5–5.0)
ALT: 18 U/L (ref 17–63)
ANION GAP: 8 (ref 5–15)
AST: 16 U/L (ref 15–41)
Alkaline Phosphatase: 70 U/L (ref 38–126)
BILIRUBIN TOTAL: 0.7 mg/dL (ref 0.3–1.2)
BUN: 20 mg/dL (ref 6–20)
CALCIUM: 9.1 mg/dL (ref 8.9–10.3)
CO2: 28 mmol/L (ref 22–32)
Chloride: 101 mmol/L (ref 101–111)
Creatinine, Ser: 1.44 mg/dL — ABNORMAL HIGH (ref 0.61–1.24)
GFR calc non Af Amer: 44 mL/min — ABNORMAL LOW (ref >60)
GFR, EST AFRICAN AMERICAN: 51 mL/min — AB (ref >60)
GLUCOSE: 90 mg/dL (ref 65–99)
POTASSIUM: 4.1 mmol/L (ref 3.5–5.1)
Sodium: 137 mmol/L (ref 135–145)
TOTAL PROTEIN: 7 g/dL (ref 6.5–8.1)

## 2015-10-27 LAB — FERRITIN: Ferritin: 693 ng/mL — ABNORMAL HIGH (ref 24–336)

## 2015-10-27 LAB — RAPID URINE DRUG SCREEN, HOSP PERFORMED
AMPHETAMINES: NOT DETECTED
BENZODIAZEPINES: NOT DETECTED
Barbiturates: NOT DETECTED
COCAINE: NOT DETECTED
OPIATES: NOT DETECTED
Tetrahydrocannabinol: NOT DETECTED

## 2015-10-27 LAB — CBC
HEMATOCRIT: 30.6 % — AB (ref 39.0–52.0)
Hemoglobin: 9.7 g/dL — ABNORMAL LOW (ref 13.0–17.0)
MCH: 31.5 pg (ref 26.0–34.0)
MCHC: 31.7 g/dL (ref 30.0–36.0)
MCV: 99.4 fL (ref 78.0–100.0)
Platelets: 126 10*3/uL — ABNORMAL LOW (ref 150–400)
RBC: 3.08 MIL/uL — ABNORMAL LOW (ref 4.22–5.81)
RDW: 15.5 % (ref 11.5–15.5)
WBC: 2.5 10*3/uL — ABNORMAL LOW (ref 4.0–10.5)

## 2015-10-27 LAB — LIPID PANEL
Cholesterol: 159 mg/dL (ref 0–200)
HDL: 18 mg/dL — ABNORMAL LOW (ref >40)
LDL Cholesterol: 92 mg/dL (ref 0–99)
Total CHOL/HDL Ratio: 8.8 RATIO
Triglycerides: 245 mg/dL — ABNORMAL HIGH (ref <150)
VLDL: 49 mg/dL — ABNORMAL HIGH (ref 0–40)

## 2015-10-27 LAB — TSH: TSH: 4.655 u[IU]/mL — AB (ref 0.350–4.500)

## 2015-10-27 LAB — NM MYOCAR MULTI W/SPECT W/WALL MOTION / EF
Peak HR: 78 {beats}/min
Rest HR: 62 {beats}/min

## 2015-10-27 LAB — VITAMIN B12: VITAMIN B 12: 1309 pg/mL — AB (ref 180–914)

## 2015-10-27 LAB — RETICULOCYTES
RBC.: 3.32 MIL/uL — ABNORMAL LOW (ref 4.22–5.81)
RETIC COUNT ABSOLUTE: 33.2 10*3/uL (ref 19.0–186.0)
Retic Ct Pct: 1 % (ref 0.4–3.1)

## 2015-10-27 LAB — TROPONIN I
TROPONIN I: 0.03 ng/mL — AB (ref <0.03)
Troponin I: 0.03 ng/mL (ref <0.03)

## 2015-10-27 LAB — MRSA PCR SCREENING: MRSA BY PCR: NEGATIVE

## 2015-10-27 LAB — FOLATE: FOLATE: 25.1 ng/mL (ref >5.9)

## 2015-10-27 MED ORDER — ALLOPURINOL 300 MG PO TABS
300.0000 mg | ORAL_TABLET | Freq: Every day | ORAL | Status: DC
  Administered 2015-10-27: 300 mg via ORAL
  Filled 2015-10-27: qty 1

## 2015-10-27 MED ORDER — METOPROLOL SUCCINATE ER 25 MG PO TB24
25.0000 mg | ORAL_TABLET | Freq: Every day | ORAL | Status: DC
  Filled 2015-10-27: qty 1

## 2015-10-27 MED ORDER — ONDANSETRON HCL 4 MG/2ML IJ SOLN: 4.0000 mg | Freq: Four times a day (QID) | INTRAMUSCULAR | Status: DC | PRN

## 2015-10-27 MED ORDER — TECHNETIUM TC 99M TETROFOSMIN IV KIT
10.0000 | PACK | Freq: Once | INTRAVENOUS | Status: AC | PRN
  Administered 2015-10-27: 10 via INTRAVENOUS

## 2015-10-27 MED ORDER — MORPHINE SULFATE (PF) 2 MG/ML IV SOLN: 2.0000 mg | INTRAVENOUS | Status: DC | PRN

## 2015-10-27 MED ORDER — ATORVASTATIN CALCIUM 40 MG PO TABS
40.0000 mg | ORAL_TABLET | Freq: Every day | ORAL | Status: DC
  Administered 2015-10-27: 40 mg via ORAL
  Filled 2015-10-27: qty 1

## 2015-10-27 MED ORDER — ISOSORBIDE MONONITRATE ER 30 MG PO TB24
30.0000 mg | ORAL_TABLET | Freq: Every day | ORAL | Status: DC
  Filled 2015-10-27: qty 1

## 2015-10-27 MED ORDER — ACETAMINOPHEN 325 MG PO TABS: 650.0000 mg | ORAL_TABLET | ORAL | Status: DC | PRN

## 2015-10-27 MED ORDER — REGADENOSON 0.4 MG/5ML IV SOLN
INTRAVENOUS | Status: AC
  Administered 2015-10-27: 0.4 mg via INTRAVENOUS
  Filled 2015-10-27: qty 5

## 2015-10-27 MED ORDER — FUROSEMIDE 20 MG PO TABS
20.0000 mg | ORAL_TABLET | Freq: Every day | ORAL | Status: DC
  Administered 2015-10-27: 20 mg via ORAL
  Filled 2015-10-27: qty 1

## 2015-10-27 MED ORDER — DIGOXIN 125 MCG PO TABS
125.0000 ug | ORAL_TABLET | Freq: Every day | ORAL | Status: DC
  Administered 2015-10-27: 125 ug via ORAL
  Filled 2015-10-27: qty 1

## 2015-10-27 MED ORDER — TECHNETIUM TC 99M TETROFOSMIN IV KIT
30.0000 | PACK | Freq: Once | INTRAVENOUS | Status: AC | PRN
  Administered 2015-10-27: 30 via INTRAVENOUS

## 2015-10-27 MED ORDER — RIVAROXABAN 15 MG PO TABS
15.0000 mg | ORAL_TABLET | Freq: Every day | ORAL | Status: DC
  Administered 2015-10-27: 15 mg via ORAL
  Filled 2015-10-27: qty 1

## 2015-10-27 MED ORDER — ALBUTEROL SULFATE (2.5 MG/3ML) 0.083% IN NEBU: 2.5000 mg | INHALATION_SOLUTION | Freq: Four times a day (QID) | RESPIRATORY_TRACT | Status: DC | PRN

## 2015-10-27 MED ORDER — RIVAROXABAN 20 MG PO TABS: 20.0000 mg | ORAL_TABLET | Freq: Every day | ORAL | Status: DC

## 2015-10-27 MED ORDER — LISINOPRIL 10 MG PO TABS
10.0000 mg | ORAL_TABLET | Freq: Every day | ORAL | Status: DC
  Filled 2015-10-27: qty 1

## 2015-10-27 MED ORDER — REGADENOSON 0.4 MG/5ML IV SOLN
0.4000 mg | Freq: Once | INTRAVENOUS | Status: AC
Start: 2015-10-27 — End: 2015-10-27
  Administered 2015-10-27: 0.4 mg via INTRAVENOUS
  Filled 2015-10-27: qty 5

## 2015-10-27 MED ORDER — ATORVASTATIN CALCIUM 40 MG PO TABS: 40.0000 mg | ORAL_TABLET | Freq: Every day | ORAL | 0 refills | Status: DC

## 2015-10-27 MED ORDER — NITROGLYCERIN 0.4 MG SL SUBL: 0.4000 mg | SUBLINGUAL_TABLET | SUBLINGUAL | Status: DC | PRN

## 2015-10-27 MED ORDER — GI COCKTAIL ~~LOC~~: 30.0000 mL | Freq: Four times a day (QID) | ORAL | Status: DC | PRN

## 2015-10-27 MED ORDER — PREDNISONE 20 MG PO TABS: 40.0000 mg | ORAL_TABLET | Freq: Every day | ORAL | Status: DC

## 2015-10-27 MED ORDER — RIVAROXABAN 15 MG PO TABS: 15.0000 mg | ORAL_TABLET | Freq: Every day | ORAL | 1 refills | Status: DC

## 2015-10-27 NOTE — ED Provider Notes (Signed)
Medical screening examination/treatment/procedure(s) were conducted as a shared visit with non-physician practitioner(s) and myself.  I personally evaluated the patient during the encounter.   EKG Interpretation  Date/Time:  Wednesday October 26 2015 17:07:46 EDT Ventricular Rate:  61 PR Interval:    QRS Duration: 170 QT Interval:  426 QTC Calculation: 428 R Axis:   133 Text Interpretation:  Ventricular-paced rhythm Biventricular pacemaker detected Abnormal ECG No acute changes Confirmed by LIU MD, DANA [phone]) on 10/26/2015 10:23:04 PM      82 -year-old male who presents with chest pain. He has a history of CAD status post CABG and stenting, ischemic cardiomyopathy with EF of 20-25%, black lung disease/COPD on chronic home oxygen, hypertension, hyperlipidemia, and with permanent pacemaker. Reports on and off chest pain for some time, that is mild and typically self resolves. States that he woke up with chest pain today, lasting for 1 hour associated with palpitations. Much more severe than typical chest pains at home. Mild dyspnea, no diaphoresis, no nausea or vomiting, no back pain, syncope or near syncope.  Vital signs stable. He is well-appearing in no acute distress. EKG with ventricularly paced rhythm. Troponin 1 is negative. Chest x-ray was no acute cardiopulmonary processes. Symptoms are not suggestive of dissection or PE at this time. He is high risk for ACS, abdominal plan to admit to telemetry for observation for the remainder of his cardiac rule out.   Forde Dandy, MD 10/27/15 [phone]

## 2015-10-27 NOTE — Discharge Summary (Addendum)
Physician Discharge Summary  [name] MRN: [id number] DOB/AGE: 1932-09-16 82 y.o.  PCP: [name], MD   Admit date: 10/26/2015 Discharge date: 10/27/2015  Discharge Diagnoses:    Principal Problem:   Chest pain Active Problems:   Systolic CHF, chronic (HCC)   S/P CABG (coronary artery bypass graft)   On home oxygen therapy   Black lung disease ([place])   Pain in the chest   Coronary artery disease involving bypass graft of transplanted heart without angina pectoris    Follow-up recommendations Follow-up with PCP in 3-5 days , including all  additional recommended appointments as below Follow-up CBC, CMP in 3-5 days Digoxin discontinued secondary to chronic kidney disease and advanced age     Current Discharge Medication List    START taking these medications   Details  atorvastatin (LIPITOR) 40 MG tablet Take 1 tablet (40 mg total) by mouth daily at 6 PM. Qty: 30 tablet, Refills: 0      CONTINUE these medications which have CHANGED   Details  Rivaroxaban (XARELTO) 15 MG TABS tablet Take 1 tablet (15 mg total) by mouth daily with supper. Qty: 30 tablet, Refills: 1      CONTINUE these medications which have NOT CHANGED   Details  acetaminophen (TYLENOL) 500 MG tablet Take 500 mg by mouth every 6 (six) hours as needed (pain).    albuterol (PROVENTIL HFA;VENTOLIN HFA) 108 (90 Base) MCG/ACT inhaler Inhale 2 puffs into the lungs every 4 (four) hours as needed for wheezing or shortness of breath. Qty: 1 Inhaler, Refills: 2    albuterol (PROVENTIL) (2.5 MG/3ML) 0.083% nebulizer solution Take 2.5 mg by nebulization every 6 (six) hours as needed for wheezing or shortness of breath.    allopurinol (ZYLOPRIM) 300 MG tablet Take 300 mg by mouth daily.      benzonatate (TESSALON) 100 MG capsule Take 1 capsule (100 mg total) by mouth every 8 (eight) hours. Qty: 21 capsule, Refills: 0    CALCIUM-VITAMIN D PO Take 1 tablet by mouth 2 (two) times daily.     furosemide  (LASIX) 20 MG tablet Take 20 mg by mouth daily.    hydrocortisone cream 1 % Apply to affected area 2 times daily Qty: 15 g, Refills: 0    isosorbide mononitrate (IMDUR) 30 MG 24 hr tablet Take 30 mg by mouth daily.    metoprolol succinate (TOPROL-XL) 25 MG 24 hr tablet Take 25 mg by mouth daily.    nitroGLYCERIN (NITROSTAT) 0.4 MG SL tablet Place 0.4 mg under the tongue every 5 (five) minutes as needed for chest pain.    Omega-3 Fatty Acids (FISH OIL) 1000 MG CAPS Take 1,000 mg by mouth 2 (two) times daily.     ondansetron (ZOFRAN ODT) 4 MG disintegrating tablet Take 1 tablet (4 mg total) by mouth every 8 (eight) hours as needed for nausea or vomiting. Qty: 20 tablet, Refills: 0    OXYGEN Inhale 2.5 L into the lungs continuous.    vitamin B-12 (CYANOCOBALAMIN) 1000 MCG tablet Take 1,000 mcg by mouth 2 (two) times daily.    lisinopril (PRINIVIL,ZESTRIL) 10 MG tablet Take 10 mg by mouth daily.      STOP taking these medications     digoxin (LANOXIN) 0.125 MG tablet      predniSONE (DELTASONE) 20 MG tablet            Discharge Condition:Stable    Discharge Instructions Get Medicines reviewed and adjusted: Please take all your medications with you for  your next visit with your Primary MD  Please request your Primary MD to go over all hospital tests and procedure/radiological results at the follow up, please ask your Primary MD to get all Hospital records sent to his/her office.  If you experience worsening of your admission symptoms, develop shortness of breath, life threatening emergency, suicidal or homicidal thoughts you must seek medical attention immediately by calling 911 or calling your MD immediately if symptoms less severe.  You must read complete instructions/literature along with all the possible adverse reactions/side effects for all the Medicines you take and that have been prescribed to you. Take any new Medicines after you have completely understood and  accpet all the possible adverse reactions/side effects.   Do not drive when taking Pain medications.   Do not take more than prescribed Pain, Sleep and Anxiety Medications  Special Instructions: If you have smoked or chewed Tobacco in the last 2 yrs please stop smoking, stop any regular Alcohol and or any Recreational drug use.  Wear Seat belts while driving.  Please note  You were cared for by a hospitalist during your hospital stay. Once you are discharged, your primary care physician will handle any further medical issues. Please note that NO REFILLS for any discharge medications will be authorized once you are discharged, as it is imperative that you return to your primary care physician (or establish a relationship with a primary care physician if you do not have one) for your aftercare needs so that they can reassess your need for medications and monitor your lab values.  Discharge Instructions    Diet - low sodium heart healthy    Complete by:  As directed   Increase activity slowly    Complete by:  As directed       Allergies  Allergen Reactions  . Aspirin Hives  . Penicillins Rash    Rash Has patient had a PCN reaction causing immediate rash, facial/tongue/throat swelling, SOB or lightheadedness with hypotension:YES Has patient had a PCN reaction causing severe rash involving mucus membranes or skin necrosis: NO Has patient had a PCN reaction that required hospitalization NO Has patient had a PCN reaction occurring within the last 10 years:NO If all of the above answers are "NO", then may proceed with Cephalosporin use.      Disposition: 01-Home or Self Care   Consults:  Cardiology     Significant Diagnostic Studies:  Dg Chest 2 View  Result Date: 10/26/2015 CLINICAL DATA:  Acute onset of generalized chest pain. Initial encounter. EXAM: CHEST  2 VIEW COMPARISON:  Chest radiograph performed 06/10/2015 FINDINGS: A small left pleural effusion is noted. Vascular  congestion is noted. Chronically increased interstitial lung markings may reflect mild interstitial lung disease. No pneumothorax is seen. The heart is normal in size. The patient is status post median sternotomy, with evidence of prior CABG. A pacemaker/AICD is noted overlying the left chest wall, with leads ending overlying the right ventricle and coronary sinus. An orphaned lead is noted overlying the right chest wall. No acute osseous abnormalities are seen. IMPRESSION: Small left pleural effusion noted. Vascular congestion seen. Chronically increased interstitial markings may reflect mild interstitial lung disease. Electronically Signed   By: Garald Balding M.D.   On: 10/26/2015 21:21   Nm Myocar Multi W/spect W/wall Motion / Ef  Result Date: 10/27/2015 CLINICAL DATA:  82 year old male with a history of chest pain. Cardiovascular risk factors include diabetes, known coronary artery disease, known cerebral artery disease EXAM: MYOCARDIAL IMAGING  WITH SPECT (REST AND PHARMACOLOGIC-STRESS) GATED LEFT VENTRICULAR WALL MOTION STUDY LEFT VENTRICULAR EJECTION FRACTION TECHNIQUE: Standard myocardial SPECT imaging was performed after resting intravenous injection of 10 mCi Tc-36mtetrofosmin. Subsequently, intravenous infusion of Lexiscan was performed under the supervision of the Cardiology staff. At peak effect of the drug, 30 mCi Tc-969metrofosmin was injected intravenously and standard myocardial SPECT imaging was performed. Quantitative gated imaging was also performed to evaluate left ventricular wall motion, and estimate left ventricular ejection fraction. COMPARISON:  None. FINDINGS: Perfusion: Nearly global decreased perfusion on both rest and stress images. Fixed perfusion defect at the apex. Fixed perfusion defect at the septal wall. There is a reversible defect involving the lateral wall at the mid ventricle extending towards the base of moderate size and moderate-severity. Wall Motion: Global  hypokinesis, with paradoxical septal wall motion at the apex. Left Ventricular Ejection Fraction: 20 % End diastolic volume 24401l End systolic volume 19027l IMPRESSION: 1. Moderate-sized fixed defect at both the apex of the heart and involving the septal wall. There is a reversible defect involving the lateral wall at the mid ventral extending towards the base of moderate-size and moderate-severity. 2. Global hypokinesis with paradoxical motion at the apical septal wall. Transient ischemic dilation. 3. Left ventricular ejection fraction 20% 4. Non invasive risk stratification*: High *2012 Appropriate Use Criteria for Coronary Revascularization Focused Update: J Am Coll Cardiol. 202536;64(4):034-742http://[email or web address]?articleid=[email or web address] Electronically Signed   By: JaCorrie Mckusick.O.   On: 10/27/2015 14:47       Filed Weights   10/26/15 1720 10/27/15 0140 10/27/15 0204  Weight: 61.2 kg (135 lb) 57.4 kg (126 lb 9.6 oz) 57.4 kg (126 lb 9.6 oz)     Microbiology: Recent Results (from the past 240 hour(s))  MRSA PCR Screening     Status: None   Collection Time: 10/27/15  1:37 AM  Result Value Ref Range Status   MRSA by PCR NEGATIVE NEGATIVE Final    Comment:        The GeneXpert MRSA Assay (FDA approved for NASAL specimens only), is one component of a comprehensive MRSA colonization surveillance program. It is not intended to diagnose MRSA infection nor to guide or monitor treatment for MRSA infections.        Blood Culture    Component Value Date/Time   SDES URINE, RANDOM 12/19/2013 0632   SPECREQUEST NONE 12/19/2013 0632   CULT NO GROWTH [facility] [id number] REPTSTATUS 12/20/2013 FINAL 12/19/2013 [id number]    Labs: Results for orders placed or performed during the hospital encounter of 10/26/15 (from the past 48 hour(s))  Basic metabolic panel     Status: Abnormal   Collection Time: 10/26/15  5:33 PM  Result Value Ref Range    Sodium 137 135 - 145 mmol/L   Potassium 4.8 3.5 - 5.1 mmol/L   Chloride 100 (L) 101 - 111 mmol/L   CO2 27 22 - 32 mmol/L   Glucose, Bld 109 (H) 65 - 99 mg/dL   BUN 22 (H) 6 - 20 mg/dL   Creatinine, Ser 1.55 (H) 0.61 - 1.24 mg/dL   Calcium 9.9 8.9 - 10.3 mg/dL   GFR calc non Af Amer 40 (L) >60 mL/min   GFR calc Af Amer 47 (L) >60 mL/min    Comment: (NOTE) The eGFR has been calculated using the CKD EPI equation. This calculation has not been validated in all clinical situations. eGFR's persistently <60 mL/min signify possible Chronic Kidney Disease.  Anion gap 10 5 - 15  CBC     Status: Abnormal   Collection Time: 10/26/15  5:33 PM  Result Value Ref Range   WBC 2.9 (L) 4.0 - 10.5 K/uL   RBC 3.02 (L) 4.22 - 5.81 MIL/uL   Hemoglobin 9.7 (L) 13.0 - 17.0 g/dL   HCT 30.0 (L) 39.0 - 52.0 %   MCV 99.3 78.0 - 100.0 fL   MCH 32.1 26.0 - 34.0 pg   MCHC 32.3 30.0 - 36.0 g/dL   RDW 15.6 (H) 11.5 - 15.5 %   Platelets 174 150 - 400 K/uL  I-stat troponin, ED     Status: None   Collection Time: 10/26/15  5:57 PM  Result Value Ref Range   Troponin i, poc 0.03 0.00 - 0.08 ng/mL   Comment 3            Comment: Due to the release kinetics of cTnI, a negative result within the first hours of the onset of symptoms does not rule out myocardial infarction with certainty. If myocardial infarction is still suspected, repeat the test at appropriate intervals.   Brain natriuretic peptide     Status: Abnormal   Collection Time: 10/26/15 10:24 PM  Result Value Ref Range   B Natriuretic Peptide 281.7 (H) 0.0 - 100.0 pg/mL  Digoxin level     Status: None   Collection Time: 10/26/15 10:24 PM  Result Value Ref Range   Digoxin Level 1.1 0.8 - 2.0 ng/mL  MRSA PCR Screening     Status: None   Collection Time: 10/27/15  1:37 AM  Result Value Ref Range   MRSA by PCR NEGATIVE NEGATIVE    Comment:        The GeneXpert MRSA Assay (FDA approved for NASAL specimens only), is one component of  a comprehensive MRSA colonization surveillance program. It is not intended to diagnose MRSA infection nor to guide or monitor treatment for MRSA infections.   Troponin I-serum (0, 3, 6 hours)     Status: Abnormal   Collection Time: 10/27/15  3:46 AM  Result Value Ref Range   Troponin I 0.03 (HH) <0.03 ng/mL    Comment: CRITICAL RESULT CALLED TO, READ BACK BY AND VERIFIED WITH: DUVAL D,RN 10/27/15 0421 WAYK   Lipid panel     Status: Abnormal   Collection Time: 10/27/15  5:15 AM  Result Value Ref Range   Cholesterol 159 0 - 200 mg/dL   Triglycerides 245 (H) <150 mg/dL   HDL 18 (L) >40 mg/dL   Total CHOL/HDL Ratio 8.8 RATIO   VLDL 49 (H) 0 - 40 mg/dL   LDL Cholesterol 92 0 - 99 mg/dL    Comment:        Total Cholesterol/HDL:CHD Risk Coronary Heart Disease Risk Table                     Men   Women  1/2 Average Risk   3.4   3.3  Average Risk       5.0   4.4  2 X Average Risk   9.6   7.1  3 X Average Risk  23.4   11.0        Use the calculated Patient Ratio above and the CHD Risk Table to determine the patient's CHD Risk.        ATP III CLASSIFICATION (LDL):  <100     mg/dL   Optimal  100-129  mg/dL   Near or Above  Optimal  130-159  mg/dL   Borderline  160-189  mg/dL   High  >190     mg/dL   Very High   TSH     Status: Abnormal   Collection Time: 10/27/15  5:18 AM  Result Value Ref Range   TSH 4.655 (H) 0.350 - 4.500 uIU/mL  Urine rapid drug screen (hosp performed)     Status: None   Collection Time: 10/27/15  6:35 AM  Result Value Ref Range   Opiates NONE DETECTED NONE DETECTED   Cocaine NONE DETECTED NONE DETECTED   Benzodiazepines NONE DETECTED NONE DETECTED   Amphetamines NONE DETECTED NONE DETECTED   Tetrahydrocannabinol NONE DETECTED NONE DETECTED   Barbiturates NONE DETECTED NONE DETECTED    Comment:        DRUG SCREEN FOR MEDICAL PURPOSES ONLY.  IF CONFIRMATION IS NEEDED FOR ANY PURPOSE, NOTIFY LAB WITHIN 5 DAYS.        LOWEST  DETECTABLE LIMITS FOR URINE DRUG SCREEN Drug Class       Cutoff (ng/mL) Amphetamine      1000 Barbiturate      200 Benzodiazepine   756 Tricyclics       433 Opiates          300 Cocaine          300 THC              50   Troponin I-serum (0, 3, 6 hours)     Status: Abnormal   Collection Time: 10/27/15  8:14 AM  Result Value Ref Range   Troponin I 0.03 (HH) <0.03 ng/mL    Comment: CRITICAL VALUE NOTED.  VALUE IS CONSISTENT WITH PREVIOUSLY REPORTED AND CALLED VALUE.  CBC     Status: Abnormal   Collection Time: 10/27/15  9:20 AM  Result Value Ref Range   WBC 2.5 (L) 4.0 - 10.5 K/uL   RBC 3.08 (L) 4.22 - 5.81 MIL/uL   Hemoglobin 9.7 (L) 13.0 - 17.0 g/dL   HCT 30.6 (L) 39.0 - 52.0 %   MCV 99.4 78.0 - 100.0 fL   MCH 31.5 26.0 - 34.0 pg   MCHC 31.7 30.0 - 36.0 g/dL   RDW 15.5 11.5 - 15.5 %   Platelets 126 (L) 150 - 400 K/uL  Comprehensive metabolic panel     Status: Abnormal   Collection Time: 10/27/15  9:20 AM  Result Value Ref Range   Sodium 137 135 - 145 mmol/L   Potassium 4.1 3.5 - 5.1 mmol/L   Chloride 101 101 - 111 mmol/L   CO2 28 22 - 32 mmol/L   Glucose, Bld 90 65 - 99 mg/dL   BUN 20 6 - 20 mg/dL   Creatinine, Ser 1.44 (H) 0.61 - 1.24 mg/dL   Calcium 9.1 8.9 - 10.3 mg/dL   Total Protein 7.0 6.5 - 8.1 g/dL   Albumin 3.6 3.5 - 5.0 g/dL   AST 16 15 - 41 U/L   ALT 18 17 - 63 U/L   Alkaline Phosphatase 70 38 - 126 U/L   Total Bilirubin 0.7 0.3 - 1.2 mg/dL   GFR calc non Af Amer 44 (L) >60 mL/min   GFR calc Af Amer 51 (L) >60 mL/min    Comment: (NOTE) The eGFR has been calculated using the CKD EPI equation. This calculation has not been validated in all clinical situations. eGFR's persistently <60 mL/min signify possible Chronic Kidney Disease.    Anion gap 8 5 - 15  Troponin  I-serum (0, 3, 6 hours)     Status: None   Collection Time: 10/27/15  1:17 PM  Result Value Ref Range   Troponin I <0.03 <0.03 ng/mL  Vitamin B12     Status: Abnormal   Collection Time:  10/27/15  1:17 PM  Result Value Ref Range   Vitamin B-12 1,309 (H) 180 - 914 pg/mL    Comment: (NOTE) This assay is not validated for testing neonatal or myeloproliferative syndrome specimens for Vitamin B12 levels.   Folate     Status: None   Collection Time: 10/27/15  1:17 PM  Result Value Ref Range   Folate 25.1 >5.9 ng/mL  Iron and TIBC     Status: None   Collection Time: 10/27/15  1:17 PM  Result Value Ref Range   Iron 78 45 - 182 ug/dL   TIBC 298 250 - 450 ug/dL   Saturation Ratios 26 17.9 - 39.5 %   UIBC 220 ug/dL  Ferritin     Status: Abnormal   Collection Time: 10/27/15  1:17 PM  Result Value Ref Range   Ferritin 693 (H) 24 - 336 ng/mL  Reticulocytes     Status: Abnormal   Collection Time: 10/27/15  1:17 PM  Result Value Ref Range   Retic Ct Pct 1.0 0.4 - 3.1 %   RBC. 3.32 (L) 4.22 - 5.81 MIL/uL   Retic Count, Manual 33.2 19.0 - 186.0 K/uL     Lipid Panel     Component Value Date/Time   CHOL 159 10/27/2015 0515   TRIG 245 (H) 10/27/2015 0515   HDL 18 (L) 10/27/2015 0515   CHOLHDL 8.8 10/27/2015 0515   VLDL 49 (H) 10/27/2015 0515   LDLCALC 92 10/27/2015 0515     Lab Results  Component Value Date   HGBA1C 5.6 05/14/2011   HGBA1C 5.6 02/10/2011   HGBA1C  07/22/2008    5.4 (NOTE) The ADA recommends the following therapeutic goal for glycemic control related to Hgb A1c measurement: Goal of therapy: <6.5 Hgb A1c  Reference: American Diabetes Association: Clinical Practice Recommendations 2010, Diabetes Care, 2010, 33: (Suppl  1).     Lab Results  Component Value Date   LDLCALC 92 10/27/2015   CREATININE 1.44 (H) 10/27/2015     HPI :  82 y.o. male with history of CAD s/p CABG 2004, chronic atrial fib, chronic systolic CHF, DM, HTN, ischemic cardiomyopathy, symptomatic bradycardia (H/o BIV-ICD with gen change in 2015 with only replacement of PPM), HLD, black lung disease, CVA, prostate CA, lung cancer, COPD w/ chronic respiratory failure on home O2,  GERD, probable CKD stage III (Cr in 2016 1.1-1.2), carotid disease whom we are asked to see for chest pain. Yesterday he was laying in bed with his arm up over his head when he suddenly felt his heart pulsating hard, thudding, associated with chest discomfort. He's not really able to characterize further. Sounds like palpitations. No nausea, vomiting, SOB. He did feel a little dizzy. He says it only lasted a few seconds then eased off. However, he became concerned because of his cardiac history so came to the ER to be evaluated. Labs notable for pancytopenia - wbc 2.5, Hgb 9.7, Plt 126, Cr 1.44 (above prior baseline). Troponins 0.03-0.03-0.03 (ref range <0.03), BNP 281, LDL 92, LFTs nl. TSH 4.655.  Patient had nuclear stress test 8/17  . Cardiology was consulted   Last 2D echo 2014: mildly dilated LV, mild LVH, EF 20-25%, akinesis of mid-distalanteroseptal myocardium, mod LAE. Nuc  2006 showed prior infarct, low EF, no ischemia. Repeat 2-D echo pending   HOSPITAL COURSE:  Chest pain -Patient with left-sidedchest pain that came on at restand improved spontaneously but is recurrent -Symptoms suggestive of atypical vs. Noncardiac chest pain - but patient is high risk due to prior h/o CAD s/p remote CABG -CXR remarkable for possible volume overload, but patient is without symptoms of CHF exacerbation -Initial cardiac enzymes negative.  -EKG not indicative of acute ischemia.  -TIMI risk score is 3; which predicts a 14 days risk of death, recurrent MI, or urgent revascularization of 13.2%.  Continue telemetry, troponins 0.033  -Patient reports allergy to ASA -morphine, statin (new medication - uncertain why patient wasn't previously taking), NTGgiven; takes Xarelto daily Triglycerides 245, LDL 92, UDS negative, TSH 4.655, He has a known ischemic DCM with EF 20-25% with prior AICD but last battery changeout to a PPM only.  Pacer interrogation showed no arrhythmias.  He ruled out for MI by enzymes  and underwent nuclear stress test  , reviewed by cardiology and showed a large fixed defect involving the septal area (could be due to paradoxical septal motion from paced rhythm), inferior, inferolateral and apical walls with no ischemia.  He has baseline dementia.  Given his underlying comorbidities including CKD and no evidence of ischemia on nuclear stress test, would recommend continuing medical management.   dose of Xarelto changed to '15mg'$  daily due to reduced creatinine clearance. Given his CKD and advanced age, stopped his digoxin.   He had been on this for chronic afib but is very well rate controlled. Continue BB/statin and long acting nitrates.  No ASA due to NOAC.  No further workup as per cardiology      Ischemic cardiomyopathy without exacerbation -last Echo appears to have been in 2014 and showed EF 20-25% -Will continue ACE, BB, Imdur,   Lasix 2-D echo   Black lung disease -Continue home O2 and daily Prednisone  Chronic atrial fib - currently BiV-paced. PPM interrogation as above, reduced dose of xarelto to 15 mg   Pancytopenia   anemia panel pending, check folate and vitamin B-12  CKD stage III - recent baseline in 05/2015 was 1.6  , stable      Blood pressure 124/87, pulse 66, temperature 98 F (36.7 C), temperature source Oral, resp. rate 18, height '5\' 6"'$  (1.676 m), weight 57.4 kg (126 lb 9.6 oz), SpO2 100 %.  General: Well developed thin WM in no acute distress. Head: Normocephalic, atraumatic, sclera non-icteric, no xanthomas, nares are without discharge.                    Neck: Negative for carotid bruits. JVD not elevated. Lungs: Clear bilaterally to auscultation without wheezes, rales, or rhonchi. Breathing is unlabored. Heart: RRR with S1 S2. No murmurs, rubs, or gallops appreciated. Prior sternotomy scar Abdomen: Soft, non-tender, non-distended with normoactive bowel sounds. No hepatomegaly. No rebound/guarding. No obvious abdominal masses. Msk:   Strength and tone appear normal for age. Extremities: No clubbing or cyanosis. No edema.  Distal pedal pulses are 2+ and equal bilaterally. Neuro: Alert and oriented X 3. No facial asymmetry. No focal deficit. Moves all extremities spontaneously.    Follow-up Information    [name], MD. Schedule an appointment as soon as possible for a visit in 2 day(s).   Specialty:  Internal Medicine Why:  Hospital follow-up Contact information: [place] [address] [phone]  SignedReyne Dumas 10/27/2015, 5:54 PM        Time spent >45 mins

## 2015-10-27 NOTE — Consult Note (Signed)
Cardiology Consultation Note    Patient ID: [name], MRN: [id number], DOB/AGE: 1932-06-28 82 y.o. Admit date: 10/26/2015   Date of Consult: 10/27/2015 Primary Physician: [name], MD Primary Cardiologist: Dr. Lovena Le  Chief Complaint: chest pain Reason for Consultation: chest pain Requesting MD: Dr. Lorin Mercy  HPI: [name] is a 82 y.o. male with history of CAD s/p CABG 2004, chronic atrial fib, chronic systolic CHF, DM, HTN, ischemic cardiomyopathy, symptomatic bradycardia (H/o BIV-ICD with gen change in 2015 with only replacement of PPM), HLD, black lung disease, CVA, prostate CA, lung cancer, COPD w/ chronic respiratory failure on home O2, GERD, probable CKD stage III (Cr in 2016 1.1-1.2), carotid disease whom we are asked to see for chest pain.  Yesterday he was laying in bed with his arm up over his head when he suddenly felt his heart pulsating hard, thudding, associated with chest discomfort. He's not really able to characterize further. Sounds like palpitations. No nausea, vomiting, SOB. He did feel a little dizzy. He says it only lasted a few seconds then eased off. However, he became concerned because of his cardiac history so came to the ER to be evaluated. Labs notable for pancytopenia - wbc 2.5, Hgb 9.7, Plt 126, Cr 1.44 (above prior baseline). Troponins 0.03-0.03-0.03 (ref range <0.03), BNP 281, LDL 92, LFTs nl. TSH 4.655. Internal medicine ordered a nuclear stress test and he was seen down in nuc med for consultation. He tolerated nuc without complication, images pending at this time. He currently feels well without recurrent sx.   Last 2D echo 2014: mildly dilated LV, mild LVH, EF 20-25%, akinesis of mid-distalanteroseptal myocardium, mod LAE. Nuc 2006 showed prior infarct, low EF, no ischemia.   Past Medical History:  Diagnosis Date  . Arthritis   . Black lung disease ([place])   . CAD (coronary artery disease)    a. 07/2002 CABG x 3: LIMA->LAD, VG->Diag, VG->OM;  b.  06/2006 Cath: LM 50-60ost/p, LAD patent mid stent, D1 sev dzs, D2 patent stent, LCX nl, OM2 sev sten prox, RCA large/nl, VG->Diag nl, VG->OM nl, LIMA->LAD atretic, EF 30%.  . Carotid artery occlusion   . Chronic atrial fibrillation ([place])   . Chronic respiratory failure ([place])   . Chronic systolic CHF (congestive heart failure) ([place])    a. 12/2012 Echo: EF 20-25%, mid-dist antsept AK, mod dil LA.  [name] COPD (chronic obstructive pulmonary disease) ([place])    a. On home O2.  [name] CVA (cerebral vascular accident) ([place])   . Diabetes mellitus   . GERD (gastroesophageal reflux disease)   . Gout   . Hypercholesterolemia   . Hypertension   . Ischemic cardiomyopathy    a. 06/2006  BIV-ICD -> gen change 2015 to [place].  . Lung cancer ([place])   . Nephrolithiasis 09/2000  . Prostate cancer ([place]) 11/01/10   gleason 7, 8, 9, gold seeds 02/08/11  . Symptomatic bradycardia       Surgical History:  Past Surgical History:  Procedure Laterality Date  . BI-VENTRICULAR PACEMAKER INSERTION (CRT-P)  12-02-13   downgrade of previously implanted CRTD to STJ CRTP  . BIV PACEMAKER GENERATOR CHANGE OUT N/A 12/02/2013   Procedure: BIV PACEMAKER GENERATOR CHANGE OUT;  Surgeon: Evans Lance, MD;  Location: [facility] CATH LAB;  Service: Cardiovascular;  Laterality: N/A;  . COLONOSCOPY    . CORONARY ANGIOPLASTY WITH STENT PLACEMENT  07/1997; 08/1997;03/1998  . CORONARY ARTERY BYPASS GRAFT  07/2002   CABG X3  . ESOPHAGOGASTRODUODENOSCOPY  02/19/2011   Procedure: ESOPHAGOGASTRODUODENOSCOPY (EGD);  Surgeon: Jeryl Columbia, MD;  Location: [facility] ENDOSCOPY;  Service: Endoscopy;  Laterality: N/A;  . INCISION AND DRAINAGE OF WOUND  08/2002   right thigh; S/P EVH  . INSERT / REPLACE / [place]; 1992; 01/2000;  . INSERT / REPLACE / REMOVE PACEMAKER  09/2003; 06/2006   w/AICD  . INSERT / REPLACE / REMOVE PACEMAKER  12/2004   pacmaker explant  . SHOULDER ARTHROSCOPY W/ ROTATOR CUFF REPAIR  05/2008   left  . upper endoscpopy         Home Meds: Prior to Admission medications   Medication Sig Start Date End Date Taking? Authorizing Provider  acetaminophen (TYLENOL) 500 MG tablet Take 500 mg by mouth every 6 (six) hours as needed (pain).    Historical Provider, MD  albuterol (PROVENTIL HFA;VENTOLIN HFA) 108 (90 Base) MCG/ACT inhaler Inhale 2 puffs into the lungs every 4 (four) hours as needed for wheezing or shortness of breath. 06/10/15   Shawn C Joy, PA-C  albuterol (PROVENTIL) (2.5 MG/3ML) 0.083% nebulizer solution Take 2.5 mg by nebulization every 6 (six) hours as needed for wheezing or shortness of breath.    Historical Provider, MD  allopurinol (ZYLOPRIM) 300 MG tablet Take 300 mg by mouth daily.      Historical Provider, MD  benzonatate (TESSALON) 100 MG capsule Take 1 capsule (100 mg total) by mouth every 8 (eight) hours. 06/10/15   Shawn C Joy, PA-C  CALCIUM-VITAMIN D PO Take 1 tablet by mouth 2 (two) times daily.     Historical Provider, MD  digoxin (LANOXIN) 0.125 MG tablet Take 125 mcg by mouth daily.      Historical Provider, MD  furosemide (LASIX) 20 MG tablet Take 20 mg by mouth daily.    Historical Provider, MD  hydrocortisone cream 1 % Apply to affected area 2 times daily Patient taking differently: Apply 1 application topically 2 (two) times daily as needed for itching. Apply to affected area 2 times daily 10/05/14   Fransico Meadow, PA-C  isosorbide mononitrate (IMDUR) 30 MG 24 hr tablet Take 30 mg by mouth daily. 07/27/13   Historical Provider, MD  lisinopril (PRINIVIL,ZESTRIL) 10 MG tablet Take 10 mg by mouth daily.    Historical Provider, MD  metoprolol succinate (TOPROL-XL) 25 MG 24 hr tablet Take 25 mg by mouth daily. 07/27/13   Historical Provider, MD  nitroGLYCERIN (NITROSTAT) 0.4 MG SL tablet Place 0.4 mg under the tongue every 5 (five) minutes as needed for chest pain.    Historical Provider, MD  Omega-3 Fatty Acids (FISH OIL) 1000 MG CAPS Take 1,000 mg by mouth 2 (two) times daily.     Historical  Provider, MD  ondansetron (ZOFRAN ODT) 4 MG disintegrating tablet Take 1 tablet (4 mg total) by mouth every 8 (eight) hours as needed for nausea or vomiting. 06/10/15   Shawn C Joy, PA-C  OXYGEN Inhale 2.5 L into the lungs continuous.    Historical Provider, MD  predniSONE (DELTASONE) 20 MG tablet Take 2 tablets (40 mg total) by mouth daily with breakfast. 06/10/15   Shawn C Joy, PA-C  vitamin B-12 (CYANOCOBALAMIN) 1000 MCG tablet Take 1,000 mcg by mouth 2 (two) times daily.    Historical Provider, MD  XARELTO 20 MG TABS tablet Take 20 mg by mouth daily with supper.  10/29/14   Historical Provider, MD    Inpatient Medications:  . allopurinol  300 mg Oral Daily  . atorvastatin  40  mg Oral q1800  . digoxin  125 mcg Oral Daily  . furosemide  20 mg Oral Daily  . isosorbide mononitrate  30 mg Oral Daily  . lisinopril  10 mg Oral Daily  . metoprolol succinate  25 mg Oral Daily  . predniSONE  40 mg Oral Q breakfast  . rivaroxaban  20 mg Oral Q supper      Allergies:  Allergies  Allergen Reactions  . Aspirin Hives    Social History   Social History  . Marital status: Widowed    Spouse name: N/A  . Number of children: N/A  . Years of education: N/A   Occupational History  . retired Retired    Equities trader   Social History Main Topics  . Smoking status: Former Smoker    Types: Cigarettes    Quit date: 03/12/1949  . Smokeless tobacco: Current User    Types: Chew  . Alcohol use No     Comment: drank until per pt 03/19/11, h/o heavy use  . Drug use: No  . Sexual activity: Yes   Other Topics Concern  . Not on file   Social History Narrative  . No narrative on file     Family History  Problem Relation Age of Onset  . Alzheimer's disease Father 79  . Cancer Father 76    metastatic prostate cancer  . Diabetes Sister 29  . Diabetes Brother   . Diabetes Brother   . Diabetes Brother   . Hypotension Neg Hx   . Malignant hyperthermia Neg Hx   . Pseudochol deficiency Neg Hx        Review of Systems: No syncope, bleeding. All other systems reviewed and are otherwise negative except as noted above.  Labs:  Recent Labs  10/27/15 0346 10/27/15 0814  TROPONINI 0.03* 0.03*   Lab Results  Component Value Date   WBC 2.5 (L) 10/27/2015   HGB 9.7 (L) 10/27/2015   HCT 30.6 (L) 10/27/2015   MCV 99.4 10/27/2015   PLT 126 (L) 10/27/2015    Recent Labs Lab 10/27/15 0920  NA 137  K 4.1  CL 101  CO2 28  BUN 20  CREATININE 1.44*  CALCIUM 9.1  PROT 7.0  BILITOT 0.7  ALKPHOS 70  ALT 18  AST 16  GLUCOSE 90   Lab Results  Component Value Date   CHOL 159 10/27/2015   HDL 18 (L) 10/27/2015   LDLCALC 92 10/27/2015   TRIG 245 (H) 10/27/2015   Lab Results  Component Value Date   DDIMER <0.27 05/01/2012    Radiology/Studies:  Dg Chest 2 View  Result Date: 10/26/2015 CLINICAL DATA:  Acute onset of generalized chest pain. Initial encounter. EXAM: CHEST  2 VIEW COMPARISON:  Chest radiograph performed 06/10/2015 FINDINGS: A small left pleural effusion is noted. Vascular congestion is noted. Chronically increased interstitial lung markings may reflect mild interstitial lung disease. No pneumothorax is seen. The heart is normal in size. The patient is status post median sternotomy, with evidence of prior CABG. A pacemaker/AICD is noted overlying the left chest wall, with leads ending overlying the right ventricle and coronary sinus. An orphaned lead is noted overlying the right chest wall. No acute osseous abnormalities are seen. IMPRESSION: Small left pleural effusion noted. Vascular congestion seen. Chronically increased interstitial markings may reflect mild interstitial lung disease. Electronically Signed   By: Garald Balding M.D.   On: 10/26/2015 21:21    Wt Readings from Last 3 Encounters:  10/27/15 126  lb 9.6 oz (57.4 kg)  11/09/14 141 lb 3.2 oz (64 kg)  02/02/14 134 lb 12.8 oz (61.1 kg)    EKG: Paced, 61bpm, no apparent p waves   Physical Exam: Blood  pressure (!) 131/58, pulse 60, temperature 98 F (36.7 C), temperature source Oral, resp. rate 18, height '5\' 6"'$  (1.676 m), weight 126 lb 9.6 oz (57.4 kg), SpO2 100 %. Body mass index is 20.43 kg/m. General: Well developed thin WM in no acute distress. Head: Normocephalic, atraumatic, sclera non-icteric, no xanthomas, nares are without discharge.  Neck: Negative for carotid bruits. JVD not elevated. Lungs: Clear bilaterally to auscultation without wheezes, rales, or rhonchi. Breathing is unlabored. Heart: RRR with S1 S2. No murmurs, rubs, or gallops appreciated. Prior sternotomy scar Abdomen: Soft, non-tender, non-distended with normoactive bowel sounds. No hepatomegaly. No rebound/guarding. No obvious abdominal masses. Msk:  Strength and tone appear normal for age. Extremities: No clubbing or cyanosis. No edema.  Distal pedal pulses are 2+ and equal bilaterally. Neuro: Alert and oriented X 3. No facial asymmetry. No focal deficit. Moves all extremities spontaneously. Psych:  Responds to questions appropriately with a normal affect.     Assessment and Plan  52M with CAD s/p CABG 2004, chronic atrial fib, chronic systolic CHF, DM, HTN, ischemic cardiomyopathy, symptomatic bradycardia (H/o BIV-ICD with gen change in 2015 with only replacement of PPM), HLD, black lung disease, CVA, prostate CA, lung cancer, COPD w/ chronic respiratory failure on home O2, GERD, probable CKD stage III (Cr 1.6 in 05/2015), carotid disease whom we are asked to see for chest pain.  1. Chest pain - somewhat atypical, possible palpitations? Mildly elevated troponin is nonspecific. IM ordered nuclear stress test which has been performed - await results. PPM has not yet been interrogated - the rep was called to come do so. Addendum: PM interrogated, d/w Trish, per rep, uneventful inerrogation without specific finding to correlate with sx.P  2. Chronic atrial fib - currently BiV-paced. PPM interrogation pending. With CKD he  actually meets criteria for the '15mg'$  daily dose as well. Will reduce. Dig level was 1.1 - will discuss with MD whether his dose needs further reduction in light of worsening renal disease.  3. Pancytopenia - further workup per IM. This will need to be followed given his chronic anticoagulation, but appears generally chronic.   4. ICM/chronic systolic CHF - does not appear overtly volume overloaded although CXR suggestive of possible congestion. BNP only marginally up. Given worsened renal insufficiency this admission I would continue home Lasix for now. 2D echo is also pending per IM.  5. CKD stage III - recent baseline in 05/2015 was 1.6 so generally stable (although worsened from 2016).  Signed, Charlie Pitter PA-C 10/27/2015, 11:25 AM Pager: [phone]

## 2015-10-27 NOTE — Progress Notes (Signed)
Reviewed discharge instructions with patient's daughter and she stated her understanding.  Discharged home with daughter via wheelchair.  [name]

## 2015-10-27 NOTE — Discharge Instructions (Signed)

## 2015-10-27 NOTE — Progress Notes (Signed)
[facility] PROGRESS NOTE  [name] CHE:[id number] DOB: July 05, 1932 DOA: 10/26/2015   PCP: [name], MD     Assessment/Plan: Principal Problem:   Chest pain Active Problems:   Systolic CHF, chronic (HCC)   S/P CABG (coronary artery bypass graft)   On home oxygen therapy   Black lung disease ([place])    82 -year-old male who presents with chest pain. He has a history of CAD status post CABG and stenting, ischemic cardiomyopathy with EF of 20-25%, black lung disease/COPD on chronic home oxygen,status post biventricular ICD implantation. hypertension, hyperlipidemia, and with permanent pacemaker. Reports on and off chest pain for some time, that is mild and typically self resolves. States that he woke up with chest pain today, lasting for 1 hour associated with palpitations. Much more severe than typical chest pains at home. Mild dyspnea, no diaphoresis, no nausea or vomiting, no back pain, syncope or near syncope.EKG with ventricularly paced rhythm. Troponin 1 is negative. Chest x-ray was no acute cardiopulmonary processes. Symptoms are not suggestive of dissection or PE at this time. He is high risk for ACS  Assessment and plan  Chest pain -Patient with left-sided chest pain that came on at rest and improved spontaneously but is recurrent -Symptoms suggestive of atypical vs. Noncardiac chest pain - but patient is high risk due to prior h/o CAD s/p remote CABG -CXR remarkable for possible volume overload, but patient is without symptoms of CHF exacerbation -Initial cardiac enzymes negative.  -EKG not indicative of acute ischemia.  -TIMI risk score is 3; which predicts a 14 days risk of death, recurrent MI, or urgent revascularization of 13.2%.  Continue telemetry, troponins 0.033  -Patient reports allergy to ASA -morphine, statin (new medication - uncertain why patient wasn't previously taking), NTG given; takes Xarelto daily Triglycerides 245, LDL 92, UDS negative,  TSH 4.655, Cardiology  following, patient had a nuclear stress test today results pending      Ischemic cardiomyopathy without exacerbation -last Echo appears to have been in 2014 and showed EF 20-25% -Will continue ACE, BB, Imdur, Digoxin, Lasix 2-D echo   Black lung disease -Continue home O2 and daily Prednisone  Chronic atrial fib - currently BiV-paced. PPM interrogation pending, reduced dose of xarelto to 15 mg   Pancytopenia Will order anemia panel, check folate and vitamin B-12  CKD stage III - recent baseline in 05/2015 was 1.6  , table  DVT prophylaxsis xarelto  Code Status:  DO NOT RESUSCITATE    Family Communication: Discussed in detail with the patient, all imaging results, lab results explained to the patient   Disposition Plan:  Cardiology consult    Consultants:  Cardiology  Procedures:  None  Antibiotics: Anti-infectives    None         HPI/Subjective:  Currently chest pain-free  Objective: Vitals:   10/27/15 0140 10/27/15 0204 10/27/15 0510 10/27/15 0818  BP: 130/67  (!) 123/59 (!) 131/58  Pulse: 63  63 60  Resp: '18  16 18  '$ Temp: 98.1 F (36.7 C)  98.3 F (36.8 C) 98 F (36.7 C)  TempSrc: Oral  Oral Oral  SpO2: 100%  98% 100%  Weight: 57.4 kg (126 lb 9.6 oz) 57.4 kg (126 lb 9.6 oz)    Height: '5\' 6"'$  (1.676 m) '5\' 6"'$  (1.676 m)      Intake/Output Summary (Last 24 hours) at 10/27/15 0915 Last data filed at 10/27/15 0600  Gross per 24 hour  Intake  240 ml  Output              225 ml  Net               15 ml    Exam:  Examination:  General exam: Appears calm and comfortable  Respiratory system: Clear to auscultation. Respiratory effort normal. Cardiovascular system: S1 & S2 heard, RRR. No JVD, murmurs, rubs, gallops or clicks. No pedal edema. Gastrointestinal system: Abdomen is nondistended, soft and nontender. No organomegaly or masses felt. Normal bowel sounds heard. Central nervous system: Alert and oriented.  No focal neurological deficits. Extremities: Symmetric 5 x 5 power. Skin: No rashes, lesions or ulcers Psychiatry: Judgement and insight appear normal. Mood & affect appropriate.     Data Reviewed: I have personally reviewed following labs and imaging studies  Micro Results Recent Results (from the past 240 hour(s))  MRSA PCR Screening     Status: None   Collection Time: 10/27/15  1:37 AM  Result Value Ref Range Status   MRSA by PCR NEGATIVE NEGATIVE Final    Comment:        The GeneXpert MRSA Assay (FDA approved for NASAL specimens only), is one component of a comprehensive MRSA colonization surveillance program. It is not intended to diagnose MRSA infection nor to guide or monitor treatment for MRSA infections.     Radiology Reports Dg Chest 2 View  Result Date: 10/26/2015 CLINICAL DATA:  Acute onset of generalized chest pain. Initial encounter. EXAM: CHEST  2 VIEW COMPARISON:  Chest radiograph performed 06/10/2015 FINDINGS: A small left pleural effusion is noted. Vascular congestion is noted. Chronically increased interstitial lung markings may reflect mild interstitial lung disease. No pneumothorax is seen. The heart is normal in size. The patient is status post median sternotomy, with evidence of prior CABG. A pacemaker/AICD is noted overlying the left chest wall, with leads ending overlying the right ventricle and coronary sinus. An orphaned lead is noted overlying the right chest wall. No acute osseous abnormalities are seen. IMPRESSION: Small left pleural effusion noted. Vascular congestion seen. Chronically increased interstitial markings may reflect mild interstitial lung disease. Electronically Signed   By: Garald Balding M.D.   On: 10/26/2015 21:21     CBC  Recent Labs Lab 10/26/15 1733  WBC 2.9*  HGB 9.7*  HCT 30.0*  PLT 174  MCV 99.3  MCH 32.1  MCHC 32.3  RDW 15.6*    Chemistries   Recent Labs Lab 10/26/15 1733  NA 137  K 4.8  CL 100*  CO2 27   GLUCOSE 109*  BUN 22*  CREATININE 1.55*  CALCIUM 9.9   ------------------------------------------------------------------------------------------------------------------ estimated creatinine clearance is 30.3 mL/min (by C-G formula based on SCr of 1.55 mg/dL). ------------------------------------------------------------------------------------------------------------------ No results for input(s): HGBA1C in the last 72 hours. ------------------------------------------------------------------------------------------------------------------  Recent Labs  10/27/15 0515  CHOL 159  HDL 18*  LDLCALC 92  TRIG 245*  CHOLHDL 8.8   ------------------------------------------------------------------------------------------------------------------  Recent Labs  10/27/15 0518  TSH 4.655*   ------------------------------------------------------------------------------------------------------------------ No results for input(s): VITAMINB12, FOLATE, FERRITIN, TIBC, IRON, RETICCTPCT in the last 72 hours.  Coagulation profile No results for input(s): INR, PROTIME in the last 168 hours.  No results for input(s): DDIMER in the last 72 hours.  Cardiac Enzymes  Recent Labs Lab 10/27/15 0346 10/27/15 0814  TROPONINI 0.03* 0.03*   ------------------------------------------------------------------------------------------------------------------ Invalid input(s): POCBNP   CBG: No results for input(s): GLUCAP in the last 168 hours.     Studies: Dg Chest 2 View  Result Date: 10/26/2015 CLINICAL DATA:  Acute onset of generalized chest pain. Initial encounter. EXAM: CHEST  2 VIEW COMPARISON:  Chest radiograph performed 06/10/2015 FINDINGS: A small left pleural effusion is noted. Vascular congestion is noted. Chronically increased interstitial lung markings may reflect mild interstitial lung disease. No pneumothorax is seen. The heart is normal in size. The patient is status post median  sternotomy, with evidence of prior CABG. A pacemaker/AICD is noted overlying the left chest wall, with leads ending overlying the right ventricle and coronary sinus. An orphaned lead is noted overlying the right chest wall. No acute osseous abnormalities are seen. IMPRESSION: Small left pleural effusion noted. Vascular congestion seen. Chronically increased interstitial markings may reflect mild interstitial lung disease. Electronically Signed   By: Garald Balding M.D.   On: 10/26/2015 21:21      Lab Results  Component Value Date   HGBA1C 5.6 05/14/2011   HGBA1C 5.6 02/10/2011   HGBA1C  07/22/2008    5.4 (NOTE) The ADA recommends the following therapeutic goal for glycemic control related to Hgb A1c measurement: Goal of therapy: <6.5 Hgb A1c  Reference: American Diabetes Association: Clinical Practice Recommendations 2010, Diabetes Care, 2010, 33: (Suppl  1).   Lab Results  Component Value Date   LDLCALC 92 10/27/2015   CREATININE 1.55 (H) 10/26/2015       Scheduled Meds: . allopurinol  300 mg Oral Daily  . atorvastatin  40 mg Oral q1800  . digoxin  125 mcg Oral Daily  . furosemide  20 mg Oral Daily  . isosorbide mononitrate  30 mg Oral Daily  . lisinopril  10 mg Oral Daily  . metoprolol succinate  25 mg Oral Daily  . predniSONE  40 mg Oral Q breakfast  . rivaroxaban  20 mg Oral Q supper   Continuous Infusions:    LOS: 0 days    Time spent: >30 MINS    [facility]  [facility] Pager [phone]. If 7PM-7AM, please contact night-coverage at [email or web address], password [facility] 10/27/2015, 9:15 AM  LOS: 0 days

## 2015-10-28 ENCOUNTER — Encounter: Payer: Medicare Other | Admitting: Internal Medicine

## 2015-10-28 LAB — HEMOGLOBIN A1C
Hgb A1c MFr Bld: 5.4 % (ref 4.8–5.6)
MEAN PLASMA GLUCOSE: 108 mg/dL

## 2015-10-31 ENCOUNTER — Encounter: Payer: Self-pay | Admitting: Internal Medicine

## 2015-10-31 DIAGNOSIS — E119 Type 2 diabetes mellitus without complications: Secondary | ICD-10-CM | POA: Diagnosis not present

## 2015-10-31 DIAGNOSIS — I480 Paroxysmal atrial fibrillation: Secondary | ICD-10-CM | POA: Diagnosis not present

## 2015-10-31 DIAGNOSIS — I5020 Unspecified systolic (congestive) heart failure: Secondary | ICD-10-CM | POA: Diagnosis not present

## 2015-10-31 DIAGNOSIS — D649 Anemia, unspecified: Secondary | ICD-10-CM | POA: Diagnosis not present

## 2015-11-07 DIAGNOSIS — E119 Type 2 diabetes mellitus without complications: Secondary | ICD-10-CM | POA: Diagnosis not present

## 2015-11-07 DIAGNOSIS — I480 Paroxysmal atrial fibrillation: Secondary | ICD-10-CM | POA: Diagnosis not present

## 2015-11-07 DIAGNOSIS — I10 Essential (primary) hypertension: Secondary | ICD-10-CM | POA: Diagnosis not present

## 2015-11-10 ENCOUNTER — Encounter: Payer: Self-pay | Admitting: Internal Medicine

## 2015-11-16 IMAGING — DX DG CHEST 2V
2 series · 2 of 2 positions shown · non-contrast
Comparison: 08/06/2013

CLINICAL DATA: Chest pain, fever, cough and shortness of breath.

EXAM:
CHEST  2 VIEW

[chest pa]
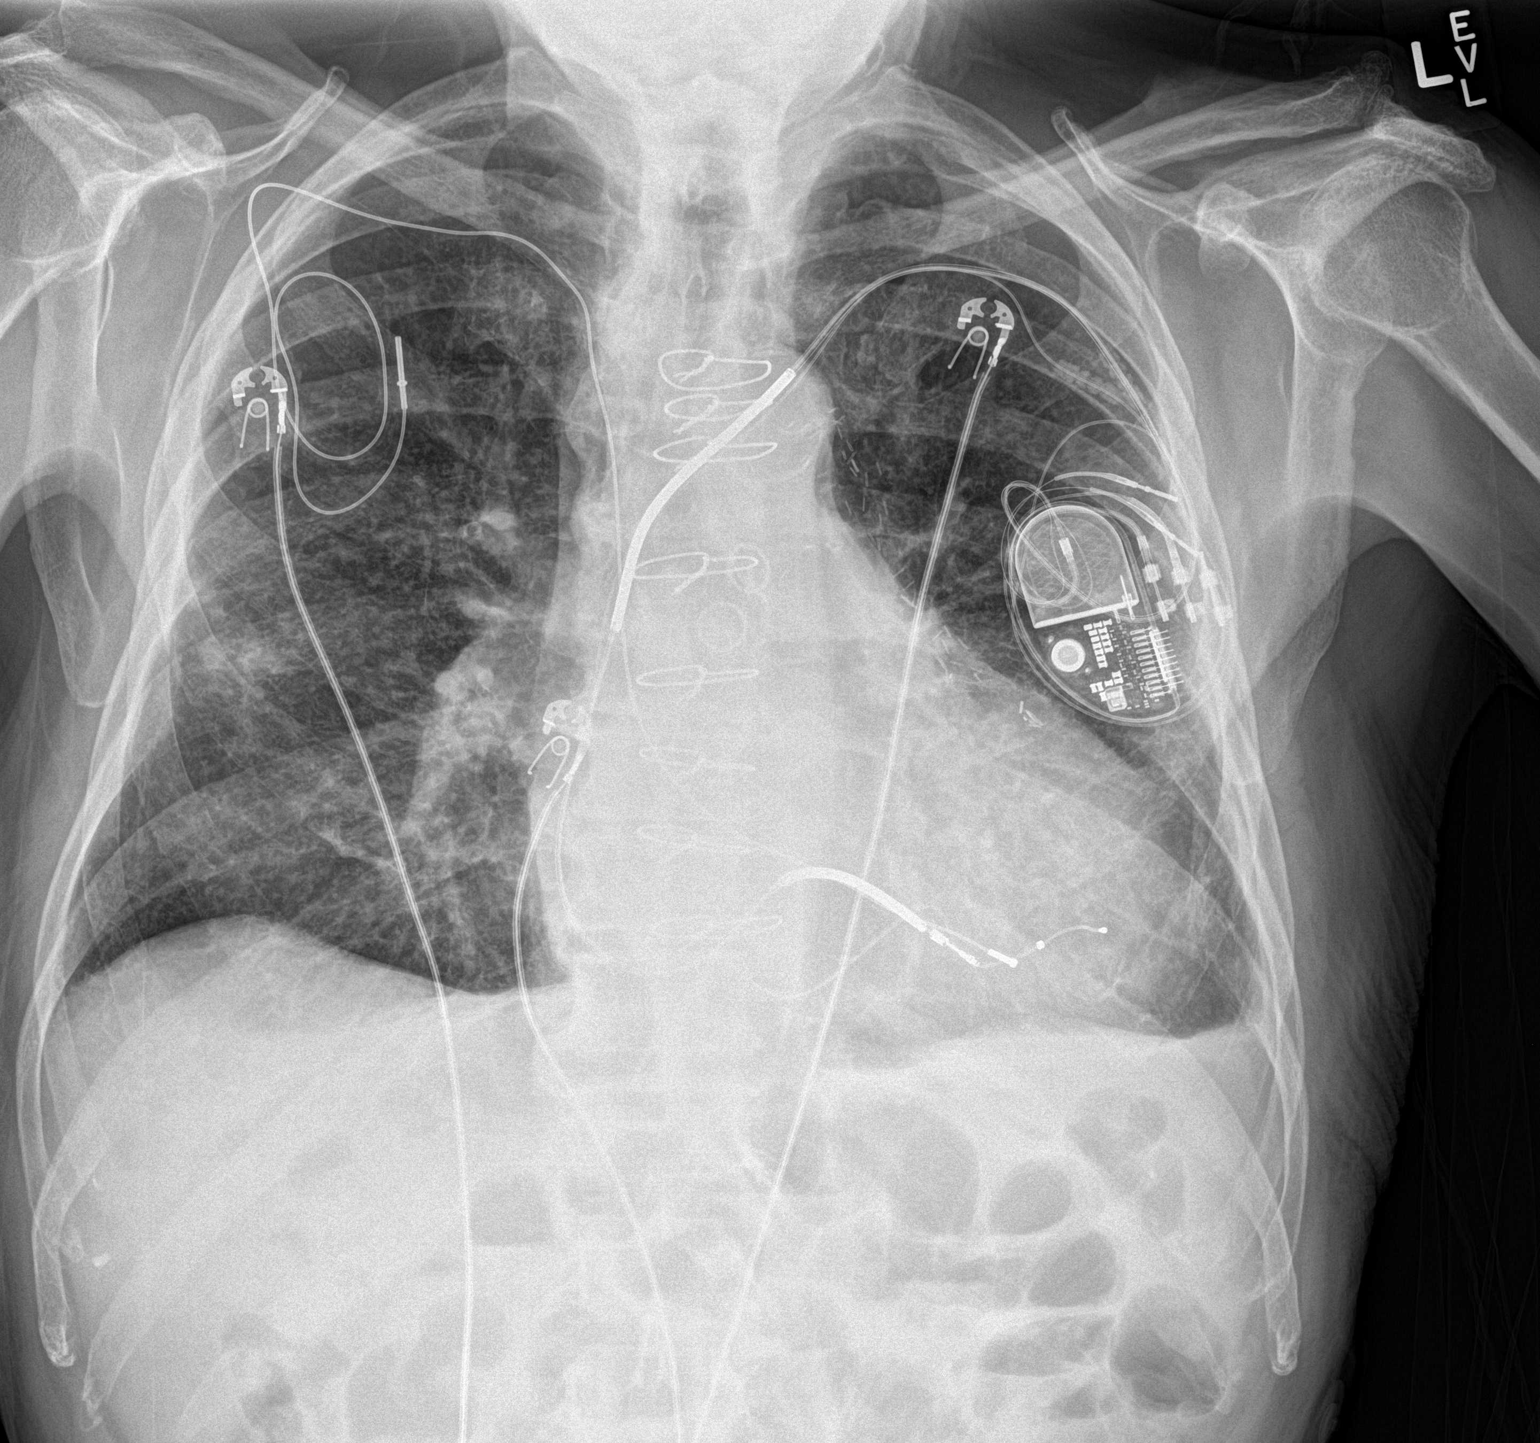

[chest lat]
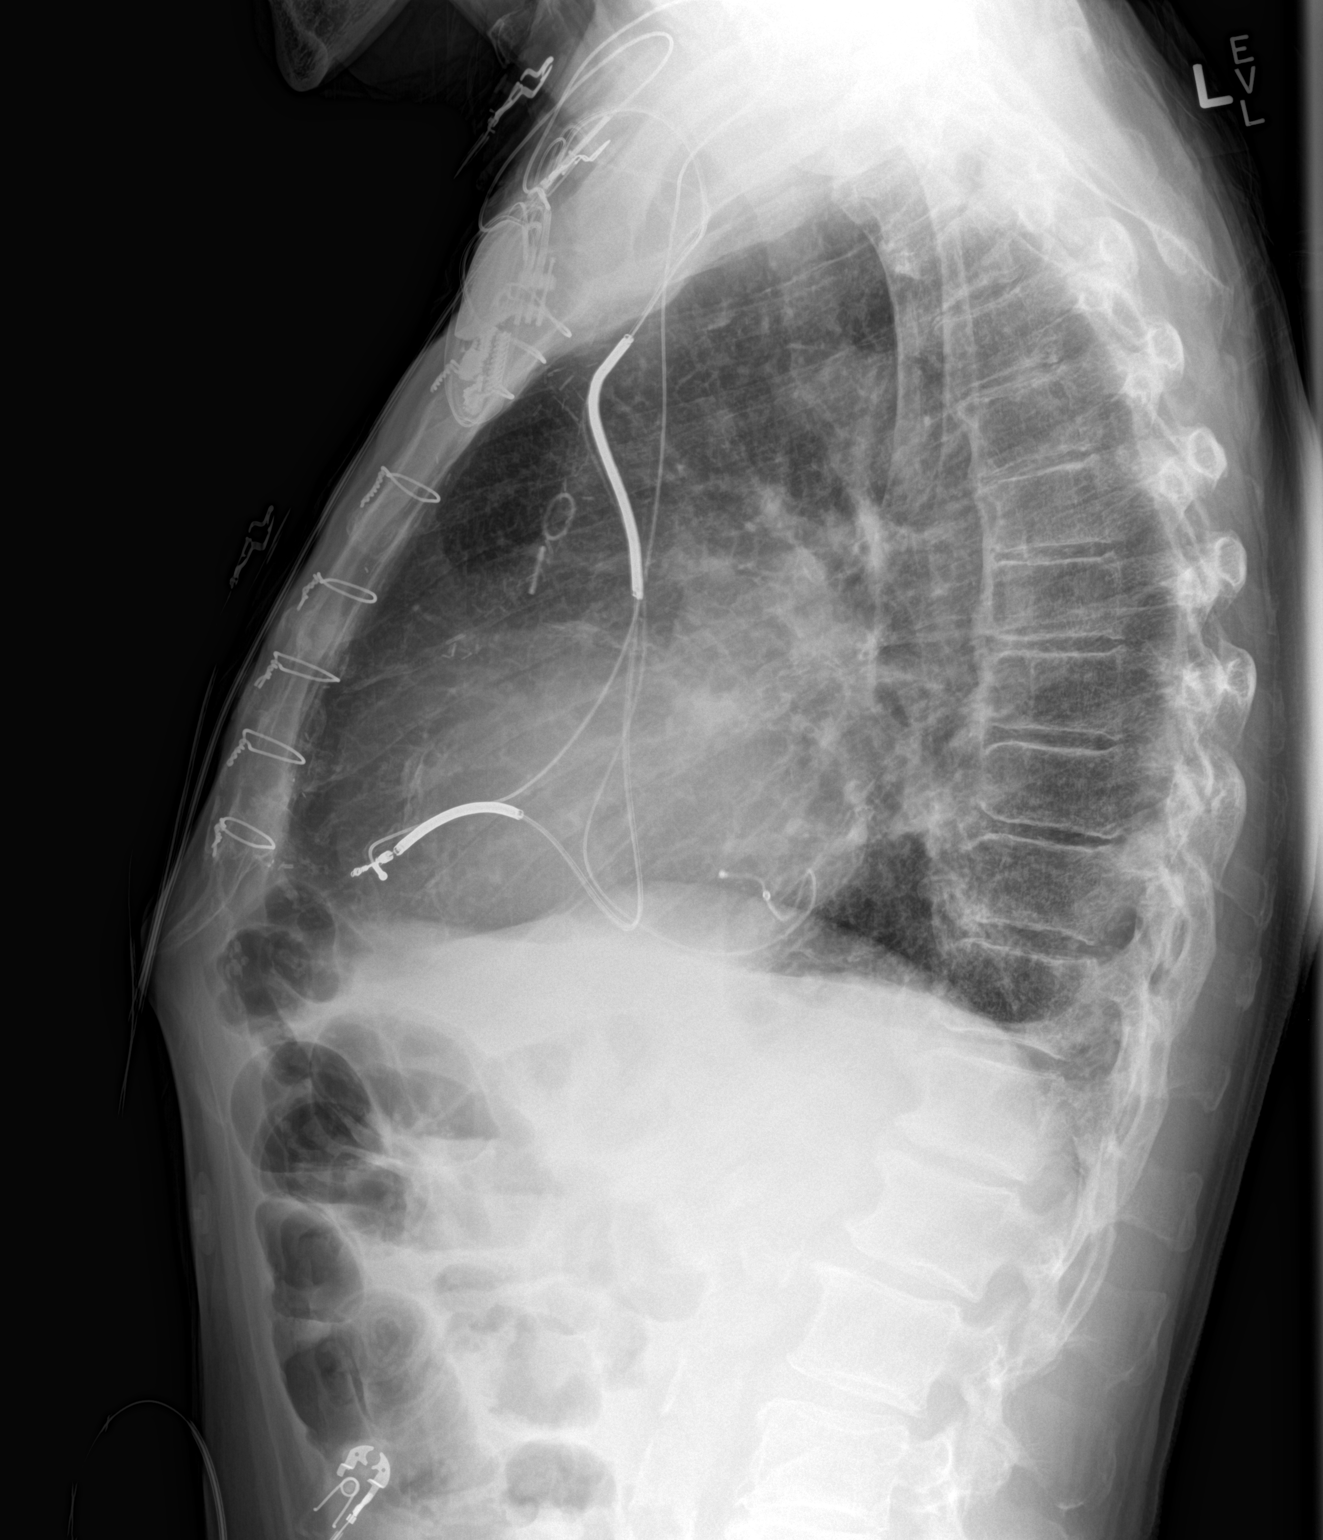

[2 of 2 positions shown; findings below may reference images not displayed]

FINDINGS: The heart is enlarged but stable. Stable pacer wires and AICD.
Stable surgical changes from bypass surgery. Stable emphysematous
changes and interstitial lung disease with areas of pulmonary
scarring. No definite acute overlying pulmonary process. Stable
left-sided pleural thickening. The bony thorax is intact.
IMPRESSION: Chronic emphysematous changes, interstitial lung disease and
pulmonary scarring. No definite acute overlying pulmonary process.

## 2015-11-21 DIAGNOSIS — J449 Chronic obstructive pulmonary disease, unspecified: Secondary | ICD-10-CM | POA: Diagnosis not present

## 2015-11-28 ENCOUNTER — Encounter: Payer: Self-pay | Admitting: Internal Medicine

## 2015-11-28 ENCOUNTER — Ambulatory Visit (INDEPENDENT_AMBULATORY_CARE_PROVIDER_SITE_OTHER): Payer: Medicare Other | Admitting: Internal Medicine

## 2015-11-28 VITALS — BP 104/48 | HR 60 | Ht 66.0 in | Wt 132.4 lb

## 2015-11-28 DIAGNOSIS — I482 Chronic atrial fibrillation, unspecified: Secondary | ICD-10-CM

## 2015-11-28 DIAGNOSIS — I5022 Chronic systolic (congestive) heart failure: Secondary | ICD-10-CM

## 2015-11-28 DIAGNOSIS — Z950 Presence of cardiac pacemaker: Secondary | ICD-10-CM

## 2015-11-28 NOTE — Patient Instructions (Signed)
Medication Instructions:    Your physician has recommended you make the following change in your medication:   1) STOP Imdur  --- If you need a refill on your cardiac medications before your next appointment, please call your pharmacy. ---  Labwork:  None ordered  Testing/Procedures:  None ordered  Follow-Up: Remote monitoring is used to monitor your Pacemaker of ICD from home. This monitoring reduces the number of office visits required to check your device to one time per year. It allows [name] to keep an eye on the functioning of your device to ensure it is working properly. You are scheduled for a device check from home on 02/27/2016. You may send your transmission at any time that day. If you have a wireless device, the transmission will be sent automatically. After your physician reviews your transmission, you will receive a postcard with your next transmission date.   Your physician wants you to follow-up in: 1 year with Dr. Lovena Le.  You will receive a reminder letter in the mail two months in advance. If you don't receive a letter, please call our office to schedule the follow-up appointment.  Thank you for choosing [facility]!!

## 2015-11-28 NOTE — Progress Notes (Signed)
HPI [name] returns today for followup. He is a 82 year old man with an ischemic cardiomyopathy, chronic systolic heart failure, symptomatic bradycardia, status post biventricular ICD implantation. He also has chronic atrial fibrillation. In the interim, the patient has been stable but c/o feeling dizzy. He denies any chest pain, syncope, or peripheral edema.  Allergies  Allergen Reactions  . Aspirin Hives  . Penicillins Rash    Rash Has patient had a PCN reaction causing immediate rash, facial/tongue/throat swelling, SOB or lightheadedness with hypotension:YES Has patient had a PCN reaction causing severe rash involving mucus membranes or skin necrosis: NO Has patient had a PCN reaction that required hospitalization NO Has patient had a PCN reaction occurring within the last 10 years:NO If all of the above answers are "NO", then may proceed with Cephalosporin use.     Current Outpatient Prescriptions  Medication Sig Dispense Refill  . acetaminophen (TYLENOL) 500 MG tablet Take 500 mg by mouth every 6 (six) hours as needed (pain).    [name] albuterol (PROVENTIL HFA;VENTOLIN HFA) 108 (90 Base) MCG/ACT inhaler Inhale 2 puffs into the lungs every 4 (four) hours as needed for wheezing or shortness of breath. 1 Inhaler 2  . albuterol (PROVENTIL) (2.5 MG/3ML) 0.083% nebulizer solution Take 2.5 mg by nebulization every 6 (six) hours as needed for wheezing or shortness of breath.    . allopurinol (ZYLOPRIM) 300 MG tablet Take 300 mg by mouth daily.      [name] atorvastatin (LIPITOR) 40 MG tablet Take 1 tablet (40 mg total) by mouth daily at 6 PM. 30 tablet 0  . benzonatate (TESSALON) 100 MG capsule Take 1 capsule (100 mg total) by mouth every 8 (eight) hours. 21 capsule 0  . CALCIUM-VITAMIN D PO Take 1 tablet by mouth 2 (two) times daily.     [name] DIGITEK 125 MCG tablet Take 1 tablet by mouth 2 (two) times daily.    . furosemide (LASIX) 20 MG tablet Take 20 mg by mouth daily.    . hydrocortisone cream 1 %  Apply to affected area 2 times daily (Patient taking differently: Apply 1 application topically 2 (two) times daily as needed for itching. Apply to affected area 2 times daily) 15 g 0  . lisinopril (PRINIVIL,ZESTRIL) 10 MG tablet Take 10 mg by mouth daily.    . metoprolol succinate (TOPROL-XL) 25 MG 24 hr tablet Take 25 mg by mouth daily.    . nitroGLYCERIN (NITROSTAT) 0.4 MG SL tablet Place 0.4 mg under the tongue every 5 (five) minutes as needed for chest pain.    . Omega-3 Fatty Acids (FISH OIL) 1000 MG CAPS Take 1,000 mg by mouth 2 (two) times daily.     . ondansetron (ZOFRAN ODT) 4 MG disintegrating tablet Take 1 tablet (4 mg total) by mouth every 8 (eight) hours as needed for nausea or vomiting. 20 tablet 0  . OXYGEN Inhale 2.5 L into the lungs continuous.    . Rivaroxaban (XARELTO) 15 MG TABS tablet Take 1 tablet (15 mg total) by mouth daily with supper. 30 tablet 1  . vitamin B-12 (CYANOCOBALAMIN) 1000 MCG tablet Take 1,000 mcg by mouth 2 (two) times daily.     No current facility-administered medications for this visit.      Past Medical History:  Diagnosis Date  . Arthritis   . Black lung disease ([place])   . CAD (coronary artery disease)    a. 07/2002 CABG x 3: LIMA->LAD, VG->Diag, VG->OM;  b. 06/2006 Cath: LM 50-60ost/p, LAD patent  mid stent, D1 sev dzs, D2 patent stent, LCX nl, OM2 sev sten prox, RCA large/nl, VG->Diag nl, VG->OM nl, LIMA->LAD atretic, EF 30%.  . Carotid artery occlusion   . Chronic atrial fibrillation ([place])   . Chronic respiratory failure ([place])   . Chronic systolic CHF (congestive heart failure) ([place])    a. 12/2012 Echo: EF 20-25%, mid-dist antsept AK, mod dil LA.  [name] COPD (chronic obstructive pulmonary disease) ([place])    a. On home O2.  [name] CVA (cerebral vascular accident) ([place])   . Diabetes mellitus   . GERD (gastroesophageal reflux disease)   . Gout   . Hypercholesterolemia   . Hypertension   . Ischemic cardiomyopathy    a. 06/2006  BIV-ICD -> gen change 2015  to [place].  . Lung cancer ([place])   . Nephrolithiasis 09/2000  . Prostate cancer ([place]) 11/01/10   gleason 7, 8, 9, gold seeds 02/08/11  . Symptomatic bradycardia     ROS:   All systems reviewed and negative except as noted in the HPI.   Past Surgical History:  Procedure Laterality Date  . BI-VENTRICULAR PACEMAKER INSERTION (CRT-P)  12-02-13   downgrade of previously implanted CRTD to STJ CRTP  . BIV PACEMAKER GENERATOR CHANGE OUT N/A 12/02/2013   Procedure: BIV PACEMAKER GENERATOR CHANGE OUT;  Surgeon: Evans Lance, MD;  Location: [facility] CATH LAB;  Service: Cardiovascular;  Laterality: N/A;  . COLONOSCOPY    . CORONARY ANGIOPLASTY WITH STENT PLACEMENT  07/1997; 08/1997;03/1998  . CORONARY ARTERY BYPASS GRAFT  07/2002   CABG X3  . ESOPHAGOGASTRODUODENOSCOPY  02/19/2011   Procedure: ESOPHAGOGASTRODUODENOSCOPY (EGD);  Surgeon: Jeryl Columbia, MD;  Location: [facility] ENDOSCOPY;  Service: Endoscopy;  Laterality: N/A;  . INCISION AND DRAINAGE OF WOUND  08/2002   right thigh; S/P EVH  . INSERT / REPLACE / [place]; 1992; 01/2000;  . INSERT / REPLACE / REMOVE PACEMAKER  09/2003; 06/2006   w/AICD  . INSERT / REPLACE / REMOVE PACEMAKER  12/2004   pacmaker explant  . SHOULDER ARTHROSCOPY W/ ROTATOR CUFF REPAIR  05/2008   left  . upper endoscpopy       Family History  Problem Relation Age of Onset  . Alzheimer's disease Father 63  . Cancer Father 49    metastatic prostate cancer  . Diabetes Sister 53  . Diabetes Brother   . Diabetes Brother   . Diabetes Brother   . Hypotension Neg Hx   . Malignant hyperthermia Neg Hx   . Pseudochol deficiency Neg Hx      Social History   Social History  . Marital status: Widowed    Spouse name: N/A  . Number of children: N/A  . Years of education: N/A   Occupational History  . retired Retired    Equities trader   Social History Main Topics  . Smoking status: Former Smoker    Types: Cigarettes    Quit date: 03/12/1949  . Smokeless  tobacco: Current User    Types: Chew  . Alcohol use No     Comment: drank until per pt 03/19/11, h/o heavy use  . Drug use: No  . Sexual activity: Yes   Other Topics Concern  . Not on file   Social History Narrative  . No narrative on file     BP (!) 104/48   Pulse 60   Ht '5\' 6"'$  (1.676 m)   Wt 132 lb 6.4 oz (60.1 kg)   BMI 21.37 kg/m  Physical Exam:  Well appearing elderly man, NAD HEENT: Unremarkable Neck:  7 cm JVD, no thyromegally Lungs:  Clear with no wheezes, rales, or rhonchi. HEART:  IRegular rate rhythm, 2/6 systolic murmur, no rubs, no clicks Abd:  soft, positive bowel sounds, no organomegally, no rebound, no guarding Ext:  2 plus pulses, no edema, no cyanosis, no clubbing Skin:  No rashes no nodules Neuro:  CN II through XII intact, motor grossly intact  ECG - atrial fib with biv pacing  DEVICE  Normal device function.  See PaceArt for details.   Assess/Plan:  1. Atrial fib - his ventricular rate is well controlled. Will follow. 2. BiV PM - His [employer] BiV PM is working normally. Will recheck in several months.  3. Chronic systolic heart failure - his symptoms are well controlled. Will follow.  [name].D.

## 2015-12-09 DIAGNOSIS — E119 Type 2 diabetes mellitus without complications: Secondary | ICD-10-CM | POA: Diagnosis not present

## 2015-12-21 DIAGNOSIS — J449 Chronic obstructive pulmonary disease, unspecified: Secondary | ICD-10-CM | POA: Diagnosis not present

## 2016-01-11 DIAGNOSIS — K922 Gastrointestinal hemorrhage, unspecified: Secondary | ICD-10-CM

## 2016-01-11 HISTORY — DX: Gastrointestinal hemorrhage, unspecified: K92.2

## 2016-01-16 ENCOUNTER — Inpatient Hospital Stay (HOSPITAL_COMMUNITY)
Admission: EM | Admit: 2016-01-16 | Discharge: 2016-01-20 | DRG: 252 | Disposition: A | Discharge status: Home Health | Payer: Medicare Other | Attending: Internal Medicine | Admitting: Internal Medicine

## 2016-01-16 ENCOUNTER — Encounter (HOSPITAL_COMMUNITY): Payer: Self-pay

## 2016-01-16 ENCOUNTER — Emergency Department (HOSPITAL_COMMUNITY): Payer: Medicare Other

## 2016-01-16 DIAGNOSIS — D631 Anemia in chronic kidney disease: Secondary | ICD-10-CM | POA: Diagnosis not present

## 2016-01-16 DIAGNOSIS — Z820 Family history of epilepsy and other diseases of the nervous system: Secondary | ICD-10-CM

## 2016-01-16 DIAGNOSIS — Z950 Presence of cardiac pacemaker: Secondary | ICD-10-CM | POA: Diagnosis not present

## 2016-01-16 DIAGNOSIS — I2510 Atherosclerotic heart disease of native coronary artery without angina pectoris: Secondary | ICD-10-CM | POA: Diagnosis present

## 2016-01-16 DIAGNOSIS — Z8673 Personal history of transient ischemic attack (TIA), and cerebral infarction without residual deficits: Secondary | ICD-10-CM

## 2016-01-16 DIAGNOSIS — I130 Hypertensive heart and chronic kidney disease with heart failure and stage 1 through stage 4 chronic kidney disease, or unspecified chronic kidney disease: Secondary | ICD-10-CM | POA: Diagnosis present

## 2016-01-16 DIAGNOSIS — Z95810 Presence of automatic (implantable) cardiac defibrillator: Secondary | ICD-10-CM | POA: Diagnosis present

## 2016-01-16 DIAGNOSIS — I255 Ischemic cardiomyopathy: Secondary | ICD-10-CM | POA: Diagnosis present

## 2016-01-16 DIAGNOSIS — Z8042 Family history of malignant neoplasm of prostate: Secondary | ICD-10-CM

## 2016-01-16 DIAGNOSIS — R55 Syncope and collapse: Secondary | ICD-10-CM | POA: Diagnosis not present

## 2016-01-16 DIAGNOSIS — I110 Hypertensive heart disease with heart failure: Secondary | ICD-10-CM | POA: Diagnosis not present

## 2016-01-16 DIAGNOSIS — K219 Gastro-esophageal reflux disease without esophagitis: Secondary | ICD-10-CM | POA: Diagnosis present

## 2016-01-16 DIAGNOSIS — I743 Embolism and thrombosis of arteries of the lower extremities: Secondary | ICD-10-CM | POA: Diagnosis not present

## 2016-01-16 DIAGNOSIS — I25812 Atherosclerosis of bypass graft of coronary artery of transplanted heart without angina pectoris: Secondary | ICD-10-CM

## 2016-01-16 DIAGNOSIS — Z85118 Personal history of other malignant neoplasm of bronchus and lung: Secondary | ICD-10-CM

## 2016-01-16 DIAGNOSIS — Z833 Family history of diabetes mellitus: Secondary | ICD-10-CM

## 2016-01-16 DIAGNOSIS — Z880 Allergy status to penicillin: Secondary | ICD-10-CM

## 2016-01-16 DIAGNOSIS — D649 Anemia, unspecified: Secondary | ICD-10-CM | POA: Diagnosis present

## 2016-01-16 DIAGNOSIS — E7800 Pure hypercholesterolemia, unspecified: Secondary | ICD-10-CM | POA: Diagnosis present

## 2016-01-16 DIAGNOSIS — N183 Chronic kidney disease, stage 3 unspecified: Secondary | ICD-10-CM | POA: Diagnosis present

## 2016-01-16 DIAGNOSIS — R0602 Shortness of breath: Secondary | ICD-10-CM

## 2016-01-16 DIAGNOSIS — J449 Chronic obstructive pulmonary disease, unspecified: Secondary | ICD-10-CM | POA: Diagnosis present

## 2016-01-16 DIAGNOSIS — N179 Acute kidney failure, unspecified: Secondary | ICD-10-CM | POA: Diagnosis present

## 2016-01-16 DIAGNOSIS — Z66 Do not resuscitate: Secondary | ICD-10-CM | POA: Diagnosis present

## 2016-01-16 DIAGNOSIS — J60 Coalworker's pneumoconiosis: Secondary | ICD-10-CM | POA: Diagnosis present

## 2016-01-16 DIAGNOSIS — Z8546 Personal history of malignant neoplasm of prostate: Secondary | ICD-10-CM

## 2016-01-16 DIAGNOSIS — Z955 Presence of coronary angioplasty implant and graft: Secondary | ICD-10-CM

## 2016-01-16 DIAGNOSIS — R1010 Upper abdominal pain, unspecified: Secondary | ICD-10-CM | POA: Diagnosis not present

## 2016-01-16 DIAGNOSIS — Z886 Allergy status to analgesic agent status: Secondary | ICD-10-CM

## 2016-01-16 DIAGNOSIS — M109 Gout, unspecified: Secondary | ICD-10-CM | POA: Diagnosis present

## 2016-01-16 DIAGNOSIS — I509 Heart failure, unspecified: Secondary | ICD-10-CM

## 2016-01-16 DIAGNOSIS — R64 Cachexia: Secondary | ICD-10-CM | POA: Diagnosis present

## 2016-01-16 DIAGNOSIS — D62 Acute posthemorrhagic anemia: Secondary | ICD-10-CM | POA: Diagnosis not present

## 2016-01-16 DIAGNOSIS — Z951 Presence of aortocoronary bypass graft: Secondary | ICD-10-CM

## 2016-01-16 DIAGNOSIS — R29818 Other symptoms and signs involving the nervous system: Secondary | ICD-10-CM | POA: Diagnosis not present

## 2016-01-16 DIAGNOSIS — I482 Chronic atrial fibrillation, unspecified: Secondary | ICD-10-CM | POA: Diagnosis present

## 2016-01-16 DIAGNOSIS — Z23 Encounter for immunization: Secondary | ICD-10-CM

## 2016-01-16 DIAGNOSIS — I350 Nonrheumatic aortic (valve) stenosis: Secondary | ICD-10-CM | POA: Diagnosis present

## 2016-01-16 DIAGNOSIS — Z7901 Long term (current) use of anticoagulants: Secondary | ICD-10-CM

## 2016-01-16 DIAGNOSIS — E1122 Type 2 diabetes mellitus with diabetic chronic kidney disease: Secondary | ICD-10-CM | POA: Diagnosis present

## 2016-01-16 DIAGNOSIS — K922 Gastrointestinal hemorrhage, unspecified: Secondary | ICD-10-CM | POA: Diagnosis not present

## 2016-01-16 DIAGNOSIS — Z9981 Dependence on supplemental oxygen: Secondary | ICD-10-CM

## 2016-01-16 DIAGNOSIS — C61 Malignant neoplasm of prostate: Secondary | ICD-10-CM | POA: Diagnosis not present

## 2016-01-16 DIAGNOSIS — J9611 Chronic respiratory failure with hypoxia: Secondary | ICD-10-CM | POA: Diagnosis present

## 2016-01-16 DIAGNOSIS — Z87891 Personal history of nicotine dependence: Secondary | ICD-10-CM

## 2016-01-16 DIAGNOSIS — R079 Chest pain, unspecified: Secondary | ICD-10-CM | POA: Diagnosis not present

## 2016-01-16 DIAGNOSIS — I5023 Acute on chronic systolic (congestive) heart failure: Secondary | ICD-10-CM | POA: Diagnosis not present

## 2016-01-16 DIAGNOSIS — C189 Malignant neoplasm of colon, unspecified: Secondary | ICD-10-CM | POA: Diagnosis not present

## 2016-01-16 DIAGNOSIS — I749 Embolism and thrombosis of unspecified artery: Secondary | ICD-10-CM | POA: Diagnosis not present

## 2016-01-16 DIAGNOSIS — D500 Iron deficiency anemia secondary to blood loss (chronic): Secondary | ICD-10-CM | POA: Diagnosis not present

## 2016-01-16 DIAGNOSIS — I998 Other disorder of circulatory system: Secondary | ICD-10-CM | POA: Diagnosis not present

## 2016-01-16 DIAGNOSIS — Z85038 Personal history of other malignant neoplasm of large intestine: Secondary | ICD-10-CM

## 2016-01-16 DIAGNOSIS — D638 Anemia in other chronic diseases classified elsewhere: Secondary | ICD-10-CM

## 2016-01-16 DIAGNOSIS — Z6821 Body mass index (BMI) 21.0-21.9, adult: Secondary | ICD-10-CM

## 2016-01-16 DIAGNOSIS — I5043 Acute on chronic combined systolic (congestive) and diastolic (congestive) heart failure: Secondary | ICD-10-CM | POA: Diagnosis present

## 2016-01-16 DIAGNOSIS — I4891 Unspecified atrial fibrillation: Secondary | ICD-10-CM | POA: Diagnosis not present

## 2016-01-16 DIAGNOSIS — I7090 Unspecified atherosclerosis: Secondary | ICD-10-CM | POA: Diagnosis not present

## 2016-01-16 DIAGNOSIS — R933 Abnormal findings on diagnostic imaging of other parts of digestive tract: Secondary | ICD-10-CM | POA: Diagnosis not present

## 2016-01-16 DIAGNOSIS — K921 Melena: Secondary | ICD-10-CM | POA: Diagnosis not present

## 2016-01-16 LAB — CBC
HCT: 26.7 % — ABNORMAL LOW (ref 39.0–52.0)
HEMOGLOBIN: 8.5 g/dL — AB (ref 13.0–17.0)
MCH: 31.6 pg (ref 26.0–34.0)
MCHC: 31.8 g/dL (ref 30.0–36.0)
MCV: 99.3 fL (ref 78.0–100.0)
Platelets: 139 10*3/uL — ABNORMAL LOW (ref 150–400)
RBC: 2.69 MIL/uL — AB (ref 4.22–5.81)
RDW: 17.4 % — ABNORMAL HIGH (ref 11.5–15.5)
WBC: 3.5 10*3/uL — ABNORMAL LOW (ref 4.0–10.5)

## 2016-01-16 LAB — I-STAT TROPONIN, ED: TROPONIN I, POC: 0.03 ng/mL (ref 0.00–0.08)

## 2016-01-16 LAB — HEPATIC FUNCTION PANEL
ALBUMIN: 3.4 g/dL — AB (ref 3.5–5.0)
ALK PHOS: 105 U/L (ref 38–126)
ALT: 21 U/L (ref 17–63)
AST: 21 U/L (ref 15–41)
BILIRUBIN TOTAL: 1.7 mg/dL — AB (ref 0.3–1.2)
Bilirubin, Direct: 0.2 mg/dL (ref 0.1–0.5)
Indirect Bilirubin: 1.5 mg/dL — ABNORMAL HIGH (ref 0.3–0.9)
Total Protein: 7.5 g/dL (ref 6.5–8.1)

## 2016-01-16 LAB — TROPONIN I: Troponin I: <0.03 ng/mL (ref <0.03)

## 2016-01-16 LAB — BRAIN NATRIURETIC PEPTIDE: B NATRIURETIC PEPTIDE 5: 1720.1 pg/mL — AB (ref 0.0–100.0)

## 2016-01-16 LAB — BASIC METABOLIC PANEL
ANION GAP: 10 (ref 5–15)
BUN: 21 mg/dL — ABNORMAL HIGH (ref 6–20)
CALCIUM: 9.2 mg/dL (ref 8.9–10.3)
CO2: 26 mmol/L (ref 22–32)
Chloride: 97 mmol/L — ABNORMAL LOW (ref 101–111)
Creatinine, Ser: 1.31 mg/dL — ABNORMAL HIGH (ref 0.61–1.24)
GFR, EST AFRICAN AMERICAN: 57 mL/min — AB (ref >60)
GFR, EST NON AFRICAN AMERICAN: 49 mL/min — AB (ref >60)
Glucose, Bld: 106 mg/dL — ABNORMAL HIGH (ref 65–99)
Potassium: 4.7 mmol/L (ref 3.5–5.1)
Sodium: 133 mmol/L — ABNORMAL LOW (ref 135–145)

## 2016-01-16 LAB — GLUCOSE, CAPILLARY: Glucose-Capillary: 126 mg/dL — ABNORMAL HIGH (ref 65–99)

## 2016-01-16 LAB — POC OCCULT BLOOD, ED: FECAL OCCULT BLD: POSITIVE — AB

## 2016-01-16 MED ORDER — ALLOPURINOL 300 MG PO TABS
300.0000 mg | ORAL_TABLET | Freq: Every day | ORAL | Status: DC
  Administered 2016-01-17 – 2016-01-20 (×4): 300 mg via ORAL
  Filled 2016-01-16 (×4): qty 1

## 2016-01-16 MED ORDER — RIVAROXABAN 15 MG PO TABS
15.0000 mg | ORAL_TABLET | Freq: Every day | ORAL | Status: DC
  Administered 2016-01-16: 15 mg via ORAL
  Filled 2016-01-16: qty 1

## 2016-01-16 MED ORDER — METHYLPREDNISOLONE SODIUM SUCC 125 MG IJ SOLR
125.0000 mg | Freq: Once | INTRAMUSCULAR | Status: AC
  Administered 2016-01-16: 125 mg via INTRAVENOUS
  Filled 2016-01-16: qty 2

## 2016-01-16 MED ORDER — LISINOPRIL 10 MG PO TABS
10.0000 mg | ORAL_TABLET | Freq: Every day | ORAL | Status: DC
  Administered 2016-01-17: 10 mg via ORAL
  Filled 2016-01-16: qty 1

## 2016-01-16 MED ORDER — FUROSEMIDE 10 MG/ML IJ SOLN
40.0000 mg | Freq: Once | INTRAMUSCULAR | Status: AC
  Administered 2016-01-16: 40 mg via INTRAVENOUS
  Filled 2016-01-16: qty 4

## 2016-01-16 MED ORDER — ISOSORBIDE MONONITRATE ER 30 MG PO TB24
30.0000 mg | ORAL_TABLET | Freq: Every day | ORAL | Status: DC
  Administered 2016-01-17 – 2016-01-20 (×4): 30 mg via ORAL
  Filled 2016-01-16 (×4): qty 1

## 2016-01-16 MED ORDER — ACETAMINOPHEN 325 MG PO TABS: 650.0000 mg | ORAL_TABLET | Freq: Four times a day (QID) | ORAL | Status: DC | PRN

## 2016-01-16 MED ORDER — ONDANSETRON HCL 4 MG/2ML IJ SOLN: 4.0000 mg | Freq: Four times a day (QID) | INTRAMUSCULAR | Status: DC | PRN

## 2016-01-16 MED ORDER — ONDANSETRON HCL 4 MG PO TABS: 4.0000 mg | ORAL_TABLET | Freq: Four times a day (QID) | ORAL | Status: DC | PRN

## 2016-01-16 MED ORDER — ALBUTEROL SULFATE (2.5 MG/3ML) 0.083% IN NEBU: 2.5000 mg | INHALATION_SOLUTION | Freq: Four times a day (QID) | RESPIRATORY_TRACT | Status: DC | PRN

## 2016-01-16 MED ORDER — METOPROLOL SUCCINATE ER 25 MG PO TB24
25.0000 mg | ORAL_TABLET | Freq: Every day | ORAL | Status: DC
  Administered 2016-01-17 – 2016-01-20 (×4): 25 mg via ORAL
  Filled 2016-01-16 (×4): qty 1

## 2016-01-16 MED ORDER — BENZONATATE 100 MG PO CAPS
100.0000 mg | ORAL_CAPSULE | Freq: Three times a day (TID) | ORAL | Status: DC
  Administered 2016-01-16 – 2016-01-20 (×12): 100 mg via ORAL
  Filled 2016-01-16 (×12): qty 1

## 2016-01-16 MED ORDER — NITROGLYCERIN 0.4 MG SL SUBL: 0.4000 mg | SUBLINGUAL_TABLET | SUBLINGUAL | Status: DC | PRN

## 2016-01-16 MED ORDER — IPRATROPIUM-ALBUTEROL 0.5-2.5 (3) MG/3ML IN SOLN
3.0000 mL | Freq: Once | RESPIRATORY_TRACT | Status: AC
  Administered 2016-01-16: 3 mL via RESPIRATORY_TRACT
  Filled 2016-01-16: qty 3

## 2016-01-16 MED ORDER — INFLUENZA VAC SPLIT QUAD 0.5 ML IM SUSY
0.5000 mL | PREFILLED_SYRINGE | INTRAMUSCULAR | Status: AC
  Administered 2016-01-17: 0.5 mL via INTRAMUSCULAR
  Filled 2016-01-16: qty 0.5

## 2016-01-16 MED ORDER — ACETAMINOPHEN 650 MG RE SUPP: 650.0000 mg | Freq: Four times a day (QID) | RECTAL | Status: DC | PRN

## 2016-01-16 MED ORDER — SODIUM CHLORIDE 0.9% FLUSH
3.0000 mL | Freq: Two times a day (BID) | INTRAVENOUS | Status: DC
  Administered 2016-01-16 – 2016-01-20 (×7): 3 mL via INTRAVENOUS

## 2016-01-16 MED ORDER — FUROSEMIDE 10 MG/ML IJ SOLN
40.0000 mg | Freq: Two times a day (BID) | INTRAMUSCULAR | Status: DC
  Administered 2016-01-17 – 2016-01-18 (×3): 40 mg via INTRAVENOUS
  Filled 2016-01-16 (×3): qty 4

## 2016-01-16 MED ORDER — INSULIN ASPART 100 UNIT/ML ~~LOC~~ SOLN
0.0000 [IU] | Freq: Three times a day (TID) | SUBCUTANEOUS | Status: DC
  Administered 2016-01-17: 1 [IU] via SUBCUTANEOUS

## 2016-01-16 NOTE — H&P (Signed)
History and Physical    [name] MWN:[id number] DOB: [birth year] DOA: 01/16/2016  PCP: [name], MD   Patient coming from: Home  Chief Complaint: SOB, weight gain  HPI: [name] is a 82 y.o. gentleman with a history of systolic heart failure, CAD S/P CABG, COPD with chronic respiratory failure (he is on 2.5L [place] at baseline), atrial fibrillation (CHADS-Vasc score of at least 5, anticoagulated with Xarelto), CKD 3, HTN, DM, PPM (former Bi-V ICD), and colon and prostate cancers who presents for evaluation of 2 days of progressive shortness of breath at rest, DOE, and orthopnea/PND.  Patient reports that he has not slept for the past two nights because of the severity of his symptoms.  He estimates that he has gained 7 lbs.  Urine output has been stable.  No chest pain or dizziness with this.  He has not had an LOC.  He fell last week and bruised his right eye.  ED Course: Chest xray shows cardiomegaly, small left pleural effusion, and interstitial fibrosis.  BNP elevated to 1720.  Troponin 0.03.  EKG shows v-paced rhythm.  Hgb trending downward (no overt blood loss).  Lasix '40mg'$  IV given in the ED.  Patient reports improvement in symptoms.  Hospitalist asked to admit for management of CHF exacerbation.  Review of Systems: As per HPI otherwise 10 point review of systems negative.    Past Medical History:  Diagnosis Date  . Arthritis   . Black lung disease ([place])   . CAD (coronary artery disease)    a. 07/2002 CABG x 3: LIMA->LAD, VG->Diag, VG->OM;  b. 06/2006 Cath: LM 50-60ost/p, LAD patent mid stent, D1 sev dzs, D2 patent stent, LCX nl, OM2 sev sten prox, RCA large/nl, VG->Diag nl, VG->OM nl, LIMA->LAD atretic, EF 30%.  . Carotid artery occlusion   . Chronic atrial fibrillation ([place])   . Chronic respiratory failure ([place])   . Chronic systolic CHF (congestive heart failure) ([place])    a. 12/2012 Echo: EF 20-25%, mid-dist antsept AK, mod dil LA.  [name] COPD (chronic obstructive pulmonary  disease) ([place])    a. On home O2.  [name] CVA (cerebral vascular accident) ([place])   . Diabetes mellitus   . GERD (gastroesophageal reflux disease)   . Gout   . Hypercholesterolemia   . Hypertension   . Ischemic cardiomyopathy    a. 06/2006  BIV-ICD -> gen change 2015 to [place].  . Lung cancer ([place])   . Nephrolithiasis 09/2000  . Prostate cancer ([place]) 11/01/10   gleason 7, 8, 9, gold seeds 02/08/11  . Symptomatic bradycardia     Past Surgical History:  Procedure Laterality Date  . BI-VENTRICULAR PACEMAKER INSERTION (CRT-P)  12-02-13   downgrade of previously implanted CRTD to STJ CRTP  . BIV PACEMAKER GENERATOR CHANGE OUT N/A 12/02/2013   Procedure: BIV PACEMAKER GENERATOR CHANGE OUT;  Surgeon: Evans Lance, MD;  Location[facility] C[facility] LAB;  Service: Cardiovascular;  Laterality: N/A;  . COLONOSCOPY    . CORONARY ANGIOPLASTY WITH STENT PLACEMENT  07/1997; 08/1997;03/1998  . CORONARY ARTERY BYPASS GRAFT  07/2002   CABG X3  . ESOPHAGOGASTRODUODENOSCOPY  02/19/2011   Procedure: ESOPHAGOGASTRODUODENOSCOPY (EGD);  Surgeon: Jeryl Columbia, MD;  Location: [facility] ENDOSCOPY;  Service: Endoscopy;  Laterality: N/A;  . INCISION AND DRAINAGE OF WOUND  08/2002   right thigh; S/P EVH  . INSERT / REPLACE / [place]; 1992; 01/2000;  . INSERT / REPLACE / REMOVE PACEMAKER  09/2003; 06/2006  w/AICD  . INSERT / REPLACE / REMOVE PACEMAKER  12/2004   pacmaker explant  . SHOULDER ARTHROSCOPY W/ ROTATOR CUFF REPAIR  05/2008   left  . upper endoscpopy       reports that he quit smoking about 66 years ago. His smoking use included Cigarettes. His smokeless tobacco use includes Chew. He reports that he does not drink alcohol or use drugs.  His wife is deceased.  Allergies  Allergen Reactions  . Aspirin Hives  . Penicillins Rash    Rash Has patient had a PCN reaction causing immediate rash, facial/tongue/throat swelling, SOB or lightheadedness with hypotension:YES Has patient had a PCN reaction causing  severe rash involving mucus membranes or skin necrosis: NO Has patient had a PCN reaction that required hospitalization NO Has patient had a PCN reaction occurring within the last 10 years:NO If all of the above answers are "NO", then may proceed with Cephalosporin use.    Family History  Problem Relation Age of Onset  . Alzheimer's disease Father 53  . Cancer Father 47    metastatic prostate cancer  . Diabetes Sister 43  . Diabetes Brother   . Diabetes Brother   . Diabetes Brother   . Hypotension Neg Hx   . Malignant hyperthermia Neg Hx   . Pseudochol deficiency Neg Hx      Prior to Admission medications   Medication Sig Start Date End Date Taking? Authorizing Provider  acetaminophen (TYLENOL) 500 MG tablet Take 500 mg by mouth every 6 (six) hours as needed (pain).   Yes Historical Provider, MD  albuterol (PROVENTIL HFA;VENTOLIN HFA) 108 (90 Base) MCG/ACT inhaler Inhale 2 puffs into the lungs every 4 (four) hours as needed for wheezing or shortness of breath. 06/10/15  Yes Shawn C Joy, PA-C  albuterol (PROVENTIL) (2.5 MG/3ML) 0.083% nebulizer solution Take 2.5 mg by nebulization every 6 (six) hours as needed for wheezing or shortness of breath.   Yes Historical Provider, MD  allopurinol (ZYLOPRIM) 300 MG tablet Take 300 mg by mouth daily.     Yes Historical Provider, MD  benzonatate (TESSALON) 100 MG capsule Take 1 capsule (100 mg total) by mouth every 8 (eight) hours. 06/10/15  Yes Shawn C Joy, PA-C  CALCIUM-VITAMIN D PO Take 1 tablet by mouth 2 (two) times daily.    Yes Historical Provider, MD  furosemide (LASIX) 20 MG tablet Take 20 mg by mouth daily.   Yes Historical Provider, MD  guaifenesin (ROBITUSSIN) 100 MG/5ML syrup Take 200 mg by mouth 3 (three) times daily as needed for cough.   Yes Historical Provider, MD  hydrocortisone cream 1 % Apply to affected area 2 times daily Patient taking differently: Apply 1 application topically 2 (two) times daily as needed for itching. Apply  to affected area 2 times daily 10/05/14  Yes Hollace Kinnier Sofia, PA-C  isosorbide mononitrate (IMDUR) 30 MG 24 hr tablet Take 30 mg by mouth daily. 01/11/16  Yes Historical Provider, MD  lisinopril (PRINIVIL,ZESTRIL) 10 MG tablet Take 10 mg by mouth daily.   Yes Historical Provider, MD  metoprolol succinate (TOPROL-XL) 25 MG 24 hr tablet Take 25 mg by mouth daily. 07/27/13  Yes Historical Provider, MD  nitroGLYCERIN (NITROSTAT) 0.4 MG SL tablet Place 0.4 mg under the tongue every 5 (five) minutes as needed for chest pain.   Yes Historical Provider, MD  Omega-3 Fatty Acids (FISH OIL) 1000 MG CAPS Take 1,000 mg by mouth at bedtime.    Yes Historical Provider, MD  OXYGEN  Inhale 2.5 L into the lungs continuous.   Yes Historical Provider, MD  Rivaroxaban (XARELTO) 15 MG TABS tablet Take 1 tablet (15 mg total) by mouth daily with supper. 10/27/15  Yes Reyne Dumas, MD  vitamin B-12 (CYANOCOBALAMIN) 1000 MCG tablet Take 1,000 mcg by mouth 2 (two) times daily.   Yes Historical Provider, MD    Physical Exam: Vitals:   01/16/16 1700 01/16/16 1730 01/16/16 1830 01/16/16 1845  BP: 143/71 145/85 145/79 137/78  Pulse: 62 60 (!) 59 66  Resp: '18 24 22 19  '$ Temp:      TempSrc:      SpO2: 100% 100% 100% 100%      Constitutional: NAD, calm, thin, chronically ill appearing man Vitals:   01/16/16 1700 01/16/16 1730 01/16/16 1830 01/16/16 1845  BP: 143/71 145/85 145/79 137/78  Pulse: 62 60 (!) 59 66  Resp: '18 24 22 19  '$ Temp:      TempSrc:      SpO2: 100% 100% 100% 100%   Eyes: PERRL, periorbital bruising to right eye ENMT: Mucous membranes are moist. Posterior pharynx clear of any exudate or lesions. Neck: normal appearance, supple Respiratory: Diminished bilaterally but no wheezing, no crackles. Normal respiratory effort. No accessory muscle use.  Cardiovascular: Normal rate, regular rhythm (paced).  No extremity edema. 2+ pedal pulses.   GI: abdomen is soft and compressible.  No distention.  No tenderness.   Bowel sounds are present. Musculoskeletal:  No joint deformity in upper and lower extremities. Good ROM, no contractures. Normal muscle tone.  Skin: no rashes, pale, cool, dry Neurologic: No focal deficits. Psychiatric: Normal judgment and insight. Alert and oriented x 3. Normal mood.     Labs on Admission: I have personally reviewed following labs and imaging studies  CBC:  Recent Labs Lab 01/16/16 1425  WBC 3.5*  HGB 8.5*  HCT 26.7*  MCV 99.3  PLT 496*   Basic Metabolic Panel:  Recent Labs Lab 01/16/16 1425  NA 133*  K 4.7  CL 97*  CO2 26  GLUCOSE 106*  BUN 21*  CREATININE 1.31*  CALCIUM 9.2   Radiological Exams on Admission: Dg Chest 2 View  Result Date: 01/16/2016 CLINICAL DATA:  Central and left chest pain for the past 2 days. Ex-smoker. EXAM: CHEST  2 VIEW COMPARISON:  10/26/2015. FINDINGS: Mildly progressive enlargement of the cardiac silhouette. Stable post CABG changes and left subclavian AICD and pacemaker leads. A right subclavian pacemaker lead is also unchanged. Prominence of the interstitial markings and right mid lung zone scarring are unchanged. Small left pleural effusion without significant change. Thoracolumbar spine degenerative changes. IMPRESSION: 1. Mildly progressive cardiomegaly. 2. Stable small left pleural effusion. 3. Stable interstitial fibrosis and right lung scarring. Electronically Signed   By: Claudie Revering M.D.   On: 01/16/2016 15:13    EKG: Independently reviewed. V-paced.  Assessment/Plan Active Problems:   Atrial fibrillation (HCC)   Pacemaker   On home oxygen therapy   Chronic respiratory failure (HCC)   Coronary artery disease involving bypass graft of transplanted heart without angina pectoris   CHF exacerbation (HCC)   Anemia   CHF (congestive heart failure) (HCC)      Acute exacerbation of chronic systolic heart failure --Diuresis with IV lasix --Strict I/O --Daily weights --Serial troponin --Continue BB and  ACE-I  CAD --Continue Xarelto, BB, ACE-I plus imdur  DM --Low dose sliding scale AC  Acute on chronic anemia.  Previous iron studies appear consistent with AOCD (likely related to  kidney disease and/or history of malignancy).  Positive stool heme occult. --Type and screen in case he needs transfusion --Repeat Hgb in AM --Consider transfusion for Hgb less than 10 since he has underlying cardiac disease --Will need to monitor volume status since reason for admission is CHF --Consider GI referral   DVT prophylaxis: Anticoagulated with Xarelto Code Status: DNR Family Communication: Spoke with daughter by phone.  Patient alone in the ED at time of admission. Disposition Plan: To be determined. Consults called: NONE Admission status: Inpatient, telemetry.  I expect this patient will need inpatient services for two midnights, including diuresis with IV lasix and surveillance of his hemoglobin with consideration for blood transfusion due to his underlying heart disease.  Volume status and renal function will have to be monitored closely, particularly post-transfusion, if he is indeed transfused.  Thus, I do not expect discharge to home tomorrow.   TIME SPENT: 70 minutes   Eber Jones MD [facility] Pager [phone]  If 7PM-7AM, please contact night-coverage [email or web address] Password [facility]  01/16/2016, 7:32 PM

## 2016-01-16 NOTE — ED Notes (Signed)
Patient brought to room by xray tech; patient undressed, in gown, on monitor, continuous pulse oximetry, blood pressure cuff and oxygen [place] (2.5L)

## 2016-01-16 NOTE — ED Notes (Signed)
This RN spoke with the main lab, they will be adding on the BNP to the pts blood work.

## 2016-01-16 NOTE — ED Notes (Signed)
Dr. Jones at bedside.

## 2016-01-16 NOTE — ED Notes (Signed)
Ambulated patient from room around nurses' station and back without any difficulty or distress; patient usuallly wears 2.5L [place] of oxygen all the time; patient was stating 100% with a heart rate of 76-82 bpm

## 2016-01-16 NOTE — ED Triage Notes (Signed)
Pt here for increased shortness of breath. Daughter reports he cannot walk any distance without becoming short of breath. Pt wears 2.5L of O2 chronically at home.

## 2016-01-16 NOTE — ED Provider Notes (Signed)
[facility] DEPT Provider Note   CSN: [id number] Arrival date & time: 01/16/16  1350     History   Chief Complaint Chief Complaint  Patient presents with  . Shortness of Breath    HPI Angus Amini Hengel is a 82 y.o. male with a history of CAD, CHF, EF 20-25%, COPD on 2.5 L at all times, and colon cancer, presents to the emergency department noting a 2 to three-day history of worsened dyspnea on exertion despite his normal nasal cannula oxygen. The patient states that he has been using his home albuterol treatments as directed to no avail of the symptoms. He states that he has also been having mild runny nose and nonproductive cough associated with this, but he denies any chest pain, chest tightness, palpitations. He denies any nausea, vomiting, diarrhea, blood in his stool, melena. He is on xarelto.   HPI  Past Medical History:  Diagnosis Date  . Arthritis   . Black lung disease ([place])   . CAD (coronary artery disease)    a. 07/2002 CABG x 3: LIMA->LAD, VG->Diag, VG->OM;  b. 06/2006 Cath: LM 50-60ost/p, LAD patent mid stent, D1 sev dzs, D2 patent stent, LCX nl, OM2 sev sten prox, RCA large/nl, VG->Diag nl, VG->OM nl, LIMA->LAD atretic, EF 30%.  . Carotid artery occlusion   . Chronic atrial fibrillation ([place])   . Chronic respiratory failure ([place])   . Chronic systolic CHF (congestive heart failure) ([place])    a. 12/2012 Echo: EF 20-25%, mid-dist antsept AK, mod dil LA.  [name] COPD (chronic obstructive pulmonary disease) ([place])    a. On home O2.  [name] CVA (cerebral vascular accident) ([place])   . Diabetes mellitus   . GERD (gastroesophageal reflux disease)   . Gout   . Hypercholesterolemia   . Hypertension   . Ischemic cardiomyopathy    a. 06/2006  BIV-ICD -> gen change 2015 to [place].  . Lung cancer ([place])   . Nephrolithiasis 09/2000  . On home oxygen therapy    "2.5L; 24/7" (01/16/2016)  . Prostate cancer ([place]) 11/01/10   gleason 7, 8, 9, gold seeds 02/08/11  . Symptomatic bradycardia      Patient Active Problem List   Diagnosis Date Noted  . CHF exacerbation ([place]) 01/16/2016  . Anemia 01/16/2016  . CHF (congestive heart failure) ([place]) 01/16/2016  . Black lung disease ([place]) 10/27/2015  . Pain in the chest   . 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 Chronic respiratory failure ([place]) 08/08/2013  . CAP (community acquired pneumonia) 08/07/2013  . Pneumonia 08/07/2013  . Occlusion and stenosis of carotid artery without mention of cerebral infarction 05/26/2013  . Syncope 02/15/2013  . Weakness generalized 05/14/2011  . Chest pain 05/14/2011  . On home oxygen therapy   . Prostate cancer ([place])   . Pacemaker   . S/P CABG (coronary artery bypass graft)   . Hematochezia 02/19/2011  . Melena 02/19/2011  . Prostate cancer, recur risk not determined whether low, med or high ([place])   . Systolic CHF, chronic ([place]) 02/16/2011  . SBO (small bowel obstruction) 02/15/2011  . ARF (acute renal failure) ([place]) 02/15/2011  . UTI (lower urinary tract infection) 02/15/2011  . Syncope and collapse 02/10/2011  . Chest pain on exertion 02/10/2011  . Automatic implantable cardioverter-defibrillator in situ 05/24/2009  . HEART BLOCK 06/01/2008  . Atrial fibrillation ([place]) 06/01/2008    Past Surgical History:  Procedure Laterality Date  . BI-VENTRICULAR PACEMAKER INSERTION (  CRT-P)  12-02-13   downgrade of previously implanted CRTD to STJ CRTP  . BIV PACEMAKER GENERATOR CHANGE OUT N/A 12/02/2013   Procedure: BIV PACEMAKER GENERATOR CHANGE OUT;  Surgeon: Evans Lance, MD;  Location: [facility] CATH LAB;  Service: Cardiovascular;  Laterality: N/A;  . COLONOSCOPY    . CORONARY ANGIOPLASTY WITH STENT PLACEMENT  07/1997; 08/1997;03/1998  . CORONARY ARTERY BYPASS GRAFT  07/2002   CABG X3  . ESOPHAGOGASTRODUODENOSCOPY  02/19/2011   Procedure: ESOPHAGOGASTRODUODENOSCOPY (EGD);  Surgeon: Jeryl Columbia, MD;  Location: [facility] ENDOSCOPY;  Service:  Endoscopy;  Laterality: N/A;  . INCISION AND DRAINAGE OF WOUND  08/2002   right thigh; S/P EVH  . INSERT / REPLACE / [place]; 1992; 01/2000;  . INSERT / REPLACE / REMOVE PACEMAKER  09/2003; 06/2006   w/AICD  . INSERT / REPLACE / REMOVE PACEMAKER  12/2004   pacmaker explant  . SHOULDER ARTHROSCOPY W/ ROTATOR CUFF REPAIR  05/2008   left  . upper endoscpopy         Home Medications    Prior to Admission medications   Medication Sig Start Date End Date Taking? Authorizing Provider  acetaminophen (TYLENOL) 500 MG tablet Take 500 mg by mouth every 6 (six) hours as needed (pain).   Yes Historical Provider, MD  albuterol (PROVENTIL HFA;VENTOLIN HFA) 108 (90 Base) MCG/ACT inhaler Inhale 2 puffs into the lungs every 4 (four) hours as needed for wheezing or shortness of breath. 06/10/15  Yes Shawn C Joy, PA-C  albuterol (PROVENTIL) (2.5 MG/3ML) 0.083% nebulizer solution Take 2.5 mg by nebulization every 6 (six) hours as needed for wheezing or shortness of breath.   Yes Historical Provider, MD  allopurinol (ZYLOPRIM) 300 MG tablet Take 300 mg by mouth daily.     Yes Historical Provider, MD  benzonatate (TESSALON) 100 MG capsule Take 1 capsule (100 mg total) by mouth every 8 (eight) hours. 06/10/15  Yes Shawn C Joy, PA-C  CALCIUM-VITAMIN D PO Take 1 tablet by mouth 2 (two) times daily.    Yes Historical Provider, MD  furosemide (LASIX) 20 MG tablet Take 20 mg by mouth daily.   Yes Historical Provider, MD  guaifenesin (ROBITUSSIN) 100 MG/5ML syrup Take 200 mg by mouth 3 (three) times daily as needed for cough.   Yes Historical Provider, MD  hydrocortisone cream 1 % Apply to affected area 2 times daily Patient taking differently: Apply 1 application topically 2 (two) times daily as needed for itching. Apply to affected area 2 times daily 10/05/14  Yes Hollace Kinnier Sofia, PA-C  isosorbide mononitrate (IMDUR) 30 MG 24 hr tablet Take 30 mg by mouth daily. 01/11/16  Yes Historical Provider, MD    lisinopril (PRINIVIL,ZESTRIL) 10 MG tablet Take 10 mg by mouth daily.   Yes Historical Provider, MD  metoprolol succinate (TOPROL-XL) 25 MG 24 hr tablet Take 25 mg by mouth daily. 07/27/13  Yes Historical Provider, MD  nitroGLYCERIN (NITROSTAT) 0.4 MG SL tablet Place 0.4 mg under the tongue every 5 (five) minutes as needed for chest pain.   Yes Historical Provider, MD  Omega-3 Fatty Acids (FISH OIL) 1000 MG CAPS Take 1,000 mg by mouth at bedtime.    Yes Historical Provider, MD  OXYGEN Inhale 2.5 L into the lungs continuous.   Yes Historical Provider, MD  Rivaroxaban (XARELTO) 15 MG TABS tablet Take 1 tablet (15 mg total) by mouth daily with supper. 10/27/15  Yes Reyne Dumas, MD  vitamin B-12 (CYANOCOBALAMIN) 1000 MCG tablet Take  1,000 mcg by mouth 2 (two) times daily.   Yes Historical Provider, MD    Family History Family History  Problem Relation Age of Onset  . Alzheimer's disease Father 12  . Cancer Father 21    metastatic prostate cancer  . Diabetes Sister 42  . Diabetes Brother   . Diabetes Brother   . Diabetes Brother   . Hypotension Neg Hx   . Malignant hyperthermia Neg Hx   . Pseudochol deficiency Neg Hx     Social History Social History  Substance Use Topics  . Smoking status: Former Smoker    Types: Cigarettes    Quit date: 03/12/1949  . Smokeless tobacco: Current User    Types: Chew  . Alcohol use No     Comment: drank until per pt 03/19/11, h/o heavy use     Allergies   Aspirin and Penicillins   Review of Systems Review of Systems  Constitutional: Positive for activity change and fatigue. Negative for appetite change, chills, diaphoresis and fever.  HENT: Positive for congestion and rhinorrhea. Negative for sore throat.   Respiratory: Positive for cough and shortness of breath. Negative for chest tightness and wheezing.   Cardiovascular: Negative for chest pain and palpitations.  Gastrointestinal: Negative for abdominal pain, blood in stool, constipation,  diarrhea, nausea and vomiting.  Genitourinary: Negative for dysuria and hematuria.  Musculoskeletal: Negative for back pain, myalgias and neck pain.  Skin: Negative for rash.  Neurological: Negative for dizziness, syncope, weakness, light-headedness and numbness.  All other systems reviewed and are negative.    Physical Exam Updated Vital Signs BP 133/66 (BP Location: Right Arm)   Pulse 64   Temp 97.6 F (36.4 C) (Oral)   Resp 18   Ht '5\' 5"'$  (1.651 m)   Wt 58.4 kg Comment: Scale B  SpO2 100%   BMI 21.43 kg/m   Physical Exam  Constitutional: He is oriented to person, place, and time. No distress.  Cachectic and chronically ill appearing.  HENT:  Head: Normocephalic.  Nose: Nose normal.  Mouth/Throat: Oropharynx is clear and moist.  Healing facial bruising from fall about 1 week prior around right eye.  Eyes: Conjunctivae and EOM are normal. Pupils are equal, round, and reactive to light.  Neck: Normal range of motion. Neck supple.  Cardiovascular: Normal rate, regular rhythm, normal heart sounds and intact distal pulses.   Pulmonary/Chest: No respiratory distress. He has decreased breath sounds. He has rales in the right lower field and the left lower field.  Abdominal: Soft. He exhibits no distension. There is no tenderness.  Musculoskeletal: He exhibits edema (mild in b/l LE). He exhibits no tenderness.  Neurological: He is alert and oriented to person, place, and time. No cranial nerve deficit. Coordination normal.  Skin: Skin is warm and dry. No rash noted. He is not diaphoretic.  Nursing note and vitals reviewed.    ED Treatments / Results  Labs (all labs ordered are listed, but only abnormal results are displayed) Labs Reviewed  BASIC METABOLIC PANEL - Abnormal; Notable for the following:       Result Value   Sodium 133 (*)    Chloride 97 (*)    Glucose, Bld 106 (*)    BUN 21 (*)    Creatinine, Ser 1.31 (*)    GFR calc non Af Amer 49 (*)    GFR calc Af Amer  57 (*)    All other components within normal limits  CBC - Abnormal; Notable for the following:  WBC 3.5 (*)    RBC 2.69 (*)    Hemoglobin 8.5 (*)    HCT 26.7 (*)    RDW 17.4 (*)    Platelets 139 (*)    All other components within normal limits  BRAIN NATRIURETIC PEPTIDE - Abnormal; Notable for the following:    B Natriuretic Peptide 1,720.1 (*)    All other components within normal limits  BASIC METABOLIC PANEL - Abnormal; Notable for the following:    Chloride 95 (*)    Glucose, Bld 140 (*)    BUN 27 (*)    Creatinine, Ser 1.34 (*)    GFR calc non Af Amer 48 (*)    GFR calc Af Amer 56 (*)    All other components within normal limits  CBC - Abnormal; Notable for the following:    WBC 1.5 (*)    RBC 2.42 (*)    Hemoglobin 7.7 (*)    HCT 23.6 (*)    RDW 17.0 (*)    Platelets 130 (*)    All other components within normal limits  HEPATIC FUNCTION PANEL - Abnormal; Notable for the following:    Albumin 3.4 (*)    Total Bilirubin 1.7 (*)    Indirect Bilirubin 1.5 (*)    All other components within normal limits  GLUCOSE, CAPILLARY - Abnormal; Notable for the following:    Glucose-Capillary 126 (*)    All other components within normal limits  GLUCOSE, CAPILLARY - Abnormal; Notable for the following:    Glucose-Capillary 139 (*)    All other components within normal limits  POC OCCULT BLOOD, ED - Abnormal; Notable for the following:    Fecal Occult Bld POSITIVE (*)    All other components within normal limits  MRSA PCR SCREENING  TROPONIN I  TROPONIN I  OCCULT BLOOD X 1 CARD TO LAB, STOOL  TROPONIN I  OCCULT BLOOD X 1 CARD TO LAB, STOOL  FERRITIN  IRON AND TIBC  I-STAT TROPOININ, ED  TYPE AND SCREEN    EKG  EKG Interpretation  Date/Time:  Monday January 16 2016 14:08:50 EST Ventricular Rate:  60 PR Interval:    QRS Duration: 166 QT Interval:  516 QTC Calculation: 516 R Axis:   146 Text Interpretation:  Ventricular-paced rhythm Biventricular pacemaker  detected Abnormal ECG No significant change since last tracing Confirmed by YAO  MD, DAVID ([id number]) on 01/16/2016 3:06:21 PM       Radiology Dg Chest 2 View  Result Date: 01/16/2016 CLINICAL DATA:  Central and left chest pain for the past 2 days. Ex-smoker. EXAM: CHEST  2 VIEW COMPARISON:  10/26/2015. FINDINGS: Mildly progressive enlargement of the cardiac silhouette. Stable post CABG changes and left subclavian AICD and pacemaker leads. A right subclavian pacemaker lead is also unchanged. Prominence of the interstitial markings and right mid lung zone scarring are unchanged. Small left pleural effusion without significant change. Thoracolumbar spine degenerative changes. IMPRESSION: 1. Mildly progressive cardiomegaly. 2. Stable small left pleural effusion. 3. Stable interstitial fibrosis and right lung scarring. Electronically Signed   By: Claudie Revering M.D.   On: 01/16/2016 15:13    Procedures Procedures (including critical care time)  Medications Ordered in ED Medications  isosorbide mononitrate (IMDUR) 24 hr tablet 30 mg (not administered)  Rivaroxaban (XARELTO) tablet 15 mg (15 mg Oral Given 01/16/16 2338)  benzonatate (TESSALON) capsule 100 mg (100 mg Oral Given 01/17/16 0655)  nitroGLYCERIN (NITROSTAT) SL tablet 0.4 mg (not administered)  albuterol (PROVENTIL) (2.5 MG/3ML) 0.083%  nebulizer solution 2.5 mg (not administered)  lisinopril (PRINIVIL,ZESTRIL) tablet 10 mg (not administered)  metoprolol succinate (TOPROL-XL) 24 hr tablet 25 mg (not administered)  allopurinol (ZYLOPRIM) tablet 300 mg (not administered)  sodium chloride flush (NS) 0.9 % injection 3 mL (3 mLs Intravenous Given 01/16/16 2338)  acetaminophen (TYLENOL) tablet 650 mg (not administered)    Or  acetaminophen (TYLENOL) suppository 650 mg (not administered)  ondansetron (ZOFRAN) tablet 4 mg (not administered)    Or  ondansetron (ZOFRAN) injection 4 mg (not administered)  insulin aspart (novoLOG) injection 0-9 Units (1  Units Subcutaneous Given 01/17/16 0746)  furosemide (LASIX) injection 40 mg (40 mg Intravenous Given 01/17/16 0652)  Influenza vac split quadrivalent PF (FLUARIX) injection 0.5 mL (not administered)  methylPREDNISolone sodium succinate (SOLU-MEDROL) 125 mg/2 mL injection 125 mg (125 mg Intravenous Given 01/16/16 1704)  ipratropium-albuterol (DUONEB) 0.5-2.5 (3) MG/3ML nebulizer solution 3 mL (3 mLs Nebulization Given 01/16/16 1703)  furosemide (LASIX) injection 40 mg (40 mg Intravenous Given 01/16/16 1758)     Initial Impression / Assessment and Plan / ED Course  I have reviewed the triage vital signs and the nursing notes.  Pertinent labs & imaging results that were available during my care of the patient were reviewed by me and considered in my medical decision making (see chart for details).  Clinical Course    82 y.o. male presents with increased DOE. CXR shows pulm fibrosis and pleural effusion, but no evidence of PNA.   He was given albuterol and solumedrol while awaiting labs.  Labs returned significant for markedly elevated BNP at 1720. Additionally his Hg was noted to be 8.5 and has been gradually decreasing over time when compared to previous lab values.   FOBT was done and showed no gross blood or melena, but was guaiac positive. This could be due to his known cancer, and anemia of chronic disease but also could be slow GI bleed, contributing to his SOB, but hard to definitively say in the setting of a CHF exacerbation, as well.   He was given IV lasix and was admitted to the hospitalist for further care and assessment.     Final Clinical Impressions(s) / ED Diagnoses   Final diagnoses:  Acute on chronic congestive heart failure, unspecified congestive heart failure type (HCC)  Anemia, unspecified type  SOB (shortness of breath)    New Prescriptions Current Discharge Medication List       Zenovia Jarred, DO 01/17/16 1026    Drenda Freeze, MD 01/17/16 1500

## 2016-01-17 ENCOUNTER — Encounter (HOSPITAL_COMMUNITY): Payer: Self-pay | Admitting: Family Medicine

## 2016-01-17 ENCOUNTER — Inpatient Hospital Stay (HOSPITAL_COMMUNITY): Payer: Medicare Other

## 2016-01-17 LAB — BASIC METABOLIC PANEL
Anion gap: 12 (ref 5–15)
BUN: 27 mg/dL — AB (ref 6–20)
CHLORIDE: 95 mmol/L — AB (ref 101–111)
CO2: 28 mmol/L (ref 22–32)
CREATININE: 1.34 mg/dL — AB (ref 0.61–1.24)
Calcium: 9 mg/dL (ref 8.9–10.3)
GFR calc Af Amer: 56 mL/min — ABNORMAL LOW (ref >60)
GFR calc non Af Amer: 48 mL/min — ABNORMAL LOW (ref >60)
GLUCOSE: 140 mg/dL — AB (ref 65–99)
Potassium: 4.5 mmol/L (ref 3.5–5.1)
Sodium: 135 mmol/L (ref 135–145)

## 2016-01-17 LAB — IRON AND TIBC
Iron: 54 ug/dL (ref 45–182)
SATURATION RATIOS: 19 % (ref 17.9–39.5)
TIBC: 280 ug/dL (ref 250–450)
UIBC: 226 ug/dL

## 2016-01-17 LAB — CBC
HCT: 27.9 % — ABNORMAL LOW (ref 39.0–52.0)
HEMATOCRIT: 23.6 % — AB (ref 39.0–52.0)
HEMOGLOBIN: 7.7 g/dL — AB (ref 13.0–17.0)
Hemoglobin: 9.2 g/dL — ABNORMAL LOW (ref 13.0–17.0)
MCH: 31.8 pg (ref 26.0–34.0)
MCH: 31.2 pg (ref 26.0–34.0)
MCHC: 32.6 g/dL (ref 30.0–36.0)
MCHC: 33 g/dL (ref 30.0–36.0)
MCV: 97.5 fL (ref 78.0–100.0)
MCV: 94.6 fL (ref 78.0–100.0)
Platelets: 130 10*3/uL — ABNORMAL LOW (ref 150–400)
Platelets: 147 10*3/uL — ABNORMAL LOW (ref 150–400)
RBC: 2.42 MIL/uL — ABNORMAL LOW (ref 4.22–5.81)
RBC: 2.95 MIL/uL — ABNORMAL LOW (ref 4.22–5.81)
RDW: 17 % — ABNORMAL HIGH (ref 11.5–15.5)
RDW: 17.8 % — ABNORMAL HIGH (ref 11.5–15.5)
WBC: 1.5 10*3/uL — ABNORMAL LOW (ref 4.0–10.5)
WBC: 3.6 10*3/uL — ABNORMAL LOW (ref 4.0–10.5)

## 2016-01-17 LAB — PREPARE RBC (CROSSMATCH)

## 2016-01-17 LAB — GLUCOSE, CAPILLARY
GLUCOSE-CAPILLARY: 115 mg/dL — AB (ref 65–99)
Glucose-Capillary: 139 mg/dL — ABNORMAL HIGH (ref 65–99)
Glucose-Capillary: 115 mg/dL — ABNORMAL HIGH (ref 65–99)
Glucose-Capillary: 116 mg/dL — ABNORMAL HIGH (ref 65–99)

## 2016-01-17 LAB — TROPONIN I: Troponin I: <0.03 ng/mL (ref <0.03)

## 2016-01-17 LAB — FERRITIN: Ferritin: 701 ng/mL — ABNORMAL HIGH (ref 24–336)

## 2016-01-17 LAB — MRSA PCR SCREENING: MRSA by PCR: NEGATIVE

## 2016-01-17 MED ORDER — SODIUM CHLORIDE 0.9 % IV SOLN
Freq: Once | INTRAVENOUS | Status: AC
  Administered 2016-01-17: 16:00:00 via INTRAVENOUS

## 2016-01-17 MED ORDER — IOPAMIDOL (ISOVUE-300) INJECTION 61%
INTRAVENOUS | Status: AC
  Filled 2016-01-17: qty 100

## 2016-01-17 MED ORDER — IOPAMIDOL (ISOVUE-300) INJECTION 61%
INTRAVENOUS | Status: AC
  Administered 2016-01-17: 80 mL
  Filled 2016-01-17: qty 100

## 2016-01-17 MED ORDER — ATORVASTATIN CALCIUM 40 MG PO TABS
40.0000 mg | ORAL_TABLET | Freq: Every day | ORAL | Status: DC
  Administered 2016-01-17 – 2016-01-19 (×3): 40 mg via ORAL
  Filled 2016-01-17 (×3): qty 1

## 2016-01-17 MED ORDER — IOPAMIDOL (ISOVUE-300) INJECTION 61%
INTRAVENOUS | Status: AC
  Filled 2016-01-17: qty 30

## 2016-01-17 NOTE — Consult Note (Signed)
[facility] Consult Note  Referring Provider: No ref. provider found Primary Care Physician:  [name], MD Primary Gastroenterologist:  Dr.  Laurel Dimmer Complaint: Dyspnea HPI: [name] is an 82 y.o. male  admitted with 2 days for dyspnea noted to have a hemoglobin of 8.5, down from 9.7 baseline in October with fall overnight to 7.7 and a: Guaiac-positive stools. He has not had any hematemesis melena or hematochezia or dyspeptic symptoms. He is on Xarelto for atrial fibrillation and CHF. He is currently off the floor getting a CT scan. He had an EGD for an abnormal CT scan and anemia and heme positivity in 2012 and this is essentially normal. There is a chart mention that he has a history of colon cancer that I cannot verify and that he has had a colonoscopy in the past at some unspecified date.  Past Medical History:  Diagnosis Date  . Arthritis   . Black lung disease ([place])   . CAD (coronary artery disease)    a. 07/2002 CABG x 3: LIMA->LAD, VG->Diag, VG->OM;  b. 06/2006 Cath: LM 50-60ost/p, LAD patent mid stent, D1 sev dzs, D2 patent stent, LCX nl, OM2 sev sten prox, RCA large/nl, VG->Diag nl, VG->OM nl, LIMA->LAD atretic, EF 30%.  . Carotid artery occlusion   . Chronic atrial fibrillation ([place])   . Chronic respiratory failure ([place])   . Chronic systolic CHF (congestive heart failure) ([place])    a. 12/2012 Echo: EF 20-25%, mid-dist antsept AK, mod dil LA.  [name] COPD (chronic obstructive pulmonary disease) ([place])    a. On home O2.  [name] CVA (cerebral vascular accident) ([place])   . Diabetes mellitus   . GERD (gastroesophageal reflux disease)   . Gout   . Hypercholesterolemia   . Hypertension   . Ischemic cardiomyopathy    a. 06/2006  BIV-ICD -> gen change 2015 to [place].  . Lung cancer ([place])   . Nephrolithiasis 09/2000  . On home oxygen therapy    "2.5L; 24/7" (01/16/2016)  . Prostate cancer ([place]) 11/01/10   gleason 7, 8, 9, gold seeds 02/08/11  . Symptomatic bradycardia      Past Surgical History:  Procedure Laterality Date  . BI-VENTRICULAR PACEMAKER INSERTION (CRT-P)  12-02-13   downgrade of previously implanted CRTD to STJ CRTP  . BIV PACEMAKER GENERATOR CHANGE OUT N/A 12/02/2013   Procedure: BIV PACEMAKER GENERATOR CHANGE OUT;  Surgeon: Evans Lance, MD;  Location: [facility] CATH LAB;  Service: Cardiovascular;  Laterality: N/A;  . COLONOSCOPY    . CORONARY ANGIOPLASTY WITH STENT PLACEMENT  07/1997; 08/1997;03/1998  . CORONARY ARTERY BYPASS GRAFT  07/2002   CABG X3  . ESOPHAGOGASTRODUODENOSCOPY  02/19/2011   Procedure: ESOPHAGOGASTRODUODENOSCOPY (EGD);  Surgeon: Jeryl Columbia, MD;  Location[facility] ENDOSCOPY;  Service: Endoscopy;  Laterality: N/A;  . INCISION AND DRAINAGE OF WOUND  08/2002   right thigh; S/P EVH  . INSERT / REPLACE / [place]; 1992; 01/2000;  . INSERT / REPLACE / REMOVE PACEMAKER  09/2003; 06/2006   w/AICD  . INSERT / REPLACE / REMOVE PACEMAKER  12/2004   pacmaker explant  . SHOULDER ARTHROSCOPY W/ ROTATOR CUFF REPAIR  05/2008   left  . upper endoscpopy      Medications Prior to Admission  Medication Sig Dispense Refill  . acetaminophen (TYLENOL) 500 MG tablet Take 500 mg by mouth every 6 (six) hours as needed (pain).    [name] albuterol (PROVENTIL HFA;VENTOLIN HFA) 108 (90 Base) MCG/ACT inhaler Inhale 2  puffs into the lungs every 4 (four) hours as needed for wheezing or shortness of breath. 1 Inhaler 2  . albuterol (PROVENTIL) (2.5 MG/3ML) 0.083% nebulizer solution Take 2.5 mg by nebulization every 6 (six) hours as needed for wheezing or shortness of breath.    . allopurinol (ZYLOPRIM) 300 MG tablet Take 300 mg by mouth daily.      . benzonatate (TESSALON) 100 MG capsule Take 1 capsule (100 mg total) by mouth every 8 (eight) hours. 21 capsule 0  . CALCIUM-VITAMIN D PO Take 1 tablet by mouth 2 (two) times daily.     . furosemide (LASIX) 20 MG tablet Take 20 mg by mouth daily.    [name] guaifenesin (ROBITUSSIN) 100 MG/5ML syrup Take 200 mg by  mouth 3 (three) times daily as needed for cough.    . hydrocortisone cream 1 % Apply to affected area 2 times daily (Patient taking differently: Apply 1 application topically 2 (two) times daily as needed for itching. Apply to affected area 2 times daily) 15 g 0  . isosorbide mononitrate (IMDUR) 30 MG 24 hr tablet Take 30 mg by mouth daily.  1  . lisinopril (PRINIVIL,ZESTRIL) 10 MG tablet Take 10 mg by mouth daily.    . metoprolol succinate (TOPROL-XL) 25 MG 24 hr tablet Take 25 mg by mouth daily.    . nitroGLYCERIN (NITROSTAT) 0.4 MG SL tablet Place 0.4 mg under the tongue every 5 (five) minutes as needed for chest pain.    . Omega-3 Fatty Acids (FISH OIL) 1000 MG CAPS Take 1,000 mg by mouth at bedtime.     . OXYGEN Inhale 2.5 L into the lungs continuous.    . Rivaroxaban (XARELTO) 15 MG TABS tablet Take 1 tablet (15 mg total) by mouth daily with supper. 30 tablet 1  . vitamin B-12 (CYANOCOBALAMIN) 1000 MCG tablet Take 1,000 mcg by mouth 2 (two) times daily.      Allergies:  Allergies  Allergen Reactions  . Aspirin Hives  . Penicillins Rash    Rash Has patient had a PCN reaction causing immediate rash, facial/tongue/throat swelling, SOB or lightheadedness with hypotension:YES Has patient had a PCN reaction causing severe rash involving mucus membranes or skin necrosis: NO Has patient had a PCN reaction that required hospitalization NO Has patient had a PCN reaction occurring within the last 10 years:NO If all of the above answers are "NO", then may proceed with Cephalosporin use.    Family History  Problem Relation Age of Onset  . Alzheimer's disease Father 68  . Cancer Father 32    metastatic prostate cancer  . Diabetes Sister 28  . Diabetes Brother   . Diabetes Brother   . Diabetes Brother   . Hypotension Neg Hx   . Malignant hyperthermia Neg Hx   . Pseudochol deficiency Neg Hx     Social History:  reports that he quit smoking about 66 years ago. His smoking use included  Cigarettes. His smokeless tobacco use includes Chew. He reports that he does not drink alcohol or use drugs.  Review of Systems: negative except Currently off the floor   Blood pressure 126/68, pulse 61, temperature 97.8 F (36.6 C), temperature source Oral, resp. rate 18, height 5' 5" (1.651 m), weight 58.4 kg (128 lb 12.8 oz), SpO2 100 %. Exam pending  Results for orders placed or performed during the hospital encounter of 01/16/16 (from the past 48 hour(s))  Basic metabolic panel     Status: Abnormal   Collection Time:  01/16/16  2:25 PM  Result Value Ref Range   Sodium 133 (L) 135 - 145 mmol/L   Potassium 4.7 3.5 - 5.1 mmol/L   Chloride 97 (L) 101 - 111 mmol/L   CO2 26 22 - 32 mmol/L   Glucose, Bld 106 (H) 65 - 99 mg/dL   BUN 21 (H) 6 - 20 mg/dL   Creatinine, Ser 1.31 (H) 0.61 - 1.24 mg/dL   Calcium 9.2 8.9 - 10.3 mg/dL   GFR calc non Af Amer 49 (L) >60 mL/min   GFR calc Af Amer 57 (L) >60 mL/min    Comment: (NOTE) The eGFR has been calculated using the CKD EPI equation. This calculation has not been validated in all clinical situations. eGFR's persistently <60 mL/min signify possible Chronic Kidney Disease.    Anion gap 10 5 - 15  CBC     Status: Abnormal   Collection Time: 01/16/16  2:25 PM  Result Value Ref Range   WBC 3.5 (L) 4.0 - 10.5 K/uL   RBC 2.69 (L) 4.22 - 5.81 MIL/uL   Hemoglobin 8.5 (L) 13.0 - 17.0 g/dL   HCT 26.7 (L) 39.0 - 52.0 %   MCV 99.3 78.0 - 100.0 fL   MCH 31.6 26.0 - 34.0 pg   MCHC 31.8 30.0 - 36.0 g/dL   RDW 17.4 (H) 11.5 - 15.5 %   Platelets 139 (L) 150 - 400 K/uL  Brain natriuretic peptide     Status: Abnormal   Collection Time: 01/16/16  2:25 PM  Result Value Ref Range   B Natriuretic Peptide 1,720.1 (H) 0.0 - 100.0 pg/mL  I-Stat Troponin, ED (not at [facility])     Status: None   Collection Time: 01/16/16  2:44 PM  Result Value Ref Range   Troponin i, poc 0.03 0.00 - 0.08 ng/mL   Comment 3            Comment: Due to the release kinetics of  cTnI, a negative result within the first hours of the onset of symptoms does not rule out myocardial infarction with certainty. If myocardial infarction is still suspected, repeat the test at appropriate intervals.   POC occult blood, ED     Status: Abnormal   Collection Time: 01/16/16  6:13 PM  Result Value Ref Range   Fecal Occult Bld POSITIVE (A) NEGATIVE  Troponin I     Status: None   Collection Time: 01/16/16 10:03 PM  Result Value Ref Range   Troponin I <0.03 <0.03 ng/mL  Hepatic function panel     Status: Abnormal   Collection Time: 01/16/16 10:03 PM  Result Value Ref Range   Total Protein 7.5 6.5 - 8.1 g/dL   Albumin 3.4 (L) 3.5 - 5.0 g/dL   AST 21 15 - 41 U/L   ALT 21 17 - 63 U/L   Alkaline Phosphatase 105 38 - 126 U/L   Total Bilirubin 1.7 (H) 0.3 - 1.2 mg/dL   Bilirubin, Direct 0.2 0.1 - 0.5 mg/dL   Indirect Bilirubin 1.5 (H) 0.3 - 0.9 mg/dL  Type and screen [place]     Status: None (Preliminary result)   Collection Time: 01/16/16 10:03 PM  Result Value Ref Range   ABO/RH(D) B POS    Antibody Screen NEG    Sample Expiration 01/19/2016    Unit Number [id number]    Blood Component Type RED CELLS,LR    Unit division 00    Status of Unit ISSUED    Transfusion  Status OK TO TRANSFUSE    Crossmatch Result Compatible   Glucose, capillary     Status: Abnormal   Collection Time: 01/16/16 11:30 PM  Result Value Ref Range   Glucose-Capillary 126 (H) 65 - 99 mg/dL   Comment 1 Notify RN    Comment 2 Document in Chart   Basic metabolic panel     Status: Abnormal   Collection Time: 01/17/16  3:23 AM  Result Value Ref Range   Sodium 135 135 - 145 mmol/L   Potassium 4.5 3.5 - 5.1 mmol/L   Chloride 95 (L) 101 - 111 mmol/L   CO2 28 22 - 32 mmol/L   Glucose, Bld 140 (H) 65 - 99 mg/dL   BUN 27 (H) 6 - 20 mg/dL   Creatinine, Ser 1.34 (H) 0.61 - 1.24 mg/dL   Calcium 9.0 8.9 - 10.3 mg/dL   GFR calc non Af Amer 48 (L) >60 mL/min   GFR calc Af Amer 56  (L) >60 mL/min    Comment: (NOTE) The eGFR has been calculated using the CKD EPI equation. This calculation has not been validated in all clinical situations. eGFR's persistently <60 mL/min signify possible Chronic Kidney Disease.    Anion gap 12 5 - 15  Troponin I     Status: None   Collection Time: 01/17/16  3:23 AM  Result Value Ref Range   Troponin I <0.03 <0.03 ng/mL  CBC     Status: Abnormal   Collection Time: 01/17/16  3:23 AM  Result Value Ref Range   WBC 1.5 (L) 4.0 - 10.5 K/uL    Comment: REPEATED TO VERIFY   RBC 2.42 (L) 4.22 - 5.81 MIL/uL   Hemoglobin 7.7 (L) 13.0 - 17.0 g/dL   HCT 23.6 (L) 39.0 - 52.0 %   MCV 97.5 78.0 - 100.0 fL   MCH 31.8 26.0 - 34.0 pg   MCHC 32.6 30.0 - 36.0 g/dL   RDW 17.0 (H) 11.5 - 15.5 %   Platelets 130 (L) 150 - 400 K/uL  MRSA PCR Screening     Status: None   Collection Time: 01/17/16  3:34 AM  Result Value Ref Range   MRSA by PCR NEGATIVE NEGATIVE    Comment:        The GeneXpert MRSA Assay (FDA approved for NASAL specimens only), is one component of a comprehensive MRSA colonization surveillance program. It is not intended to diagnose MRSA infection nor to guide or monitor treatment for MRSA infections.   Glucose, capillary     Status: Abnormal   Collection Time: 01/17/16  7:23 AM  Result Value Ref Range   Glucose-Capillary 139 (H) 65 - 99 mg/dL   Comment 1 Notify RN    Comment 2 Document in Chart   Troponin I     Status: None   Collection Time: 01/17/16 11:07 AM  Result Value Ref Range   Troponin I <0.03 <0.03 ng/mL  Ferritin     Status: Abnormal   Collection Time: 01/17/16 11:07 AM  Result Value Ref Range   Ferritin 701 (H) 24 - 336 ng/mL  Iron and TIBC     Status: None   Collection Time: 01/17/16 11:07 AM  Result Value Ref Range   Iron 54 45 - 182 ug/dL   TIBC 280 250 - 450 ug/dL   Saturation Ratios 19 17.9 - 39.5 %   UIBC 226 ug/dL  Glucose, capillary     Status: Abnormal   Collection Time: 01/17/16  1:06 PM  Result Value Ref Range   Glucose-Capillary 115 (H) 65 - 99 mg/dL  Prepare RBC     Status: None   Collection Time: 01/17/16  2:38 PM  Result Value Ref Range   Order Confirmation ORDER PROCESSED BY BLOOD BANK    Dg Chest 2 View  Result Date: 01/16/2016 CLINICAL DATA:  Central and left chest pain for the past 2 days. Ex-smoker. EXAM: CHEST  2 VIEW COMPARISON:  10/26/2015. FINDINGS: Mildly progressive enlargement of the cardiac silhouette. Stable post CABG changes and left subclavian AICD and pacemaker leads. A right subclavian pacemaker lead is also unchanged. Prominence of the interstitial markings and right mid lung zone scarring are unchanged. Small left pleural effusion without significant change. Thoracolumbar spine degenerative changes. IMPRESSION: 1. Mildly progressive cardiomegaly. 2. Stable small left pleural effusion. 3. Stable interstitial fibrosis and right lung scarring. Electronically Signed   By: Claudie Revering M.D.   On: 01/16/2016 15:13    Assessment: Anemia, heme positive stool in an elderly patient on anticoagulants with multiple cardiopulmonary conditions. Plan:  Will see patient tomorrow for further history and exam. Await CT scan of abdomen.. With information of present overall would favor conservative approach unless more active bleeding evident. Consider a two-month course of PPI empirically. Will follow with you. HAYES,JOHN C 01/17/2016, 4:08 PM  Pager [phone] If no answer or after 5 PM call [phone]

## 2016-01-17 NOTE — Progress Notes (Addendum)
PROGRESS NOTE  [name]  TKW:[id number] DOB: 1932-08-26 DOA: 01/16/2016 PCP: [name], MD  Outpatient Specialists: Cardiology/EP: Dr. Lovena Le Vascular surgery: Dr. Donnetta Hutching Radiation oncology: Dr. Valere Dross  Brief Narrative: [name] is a 82 y.o. gentleman with a history of systolic heart failure, CAD s/p CABG, COPD with chronic respiratory failure (on 2.5L [place] at baseline), atrial fibrillation, CKD 3, HTN, DM, PPM (former Bi-V ICD), lung and prostate cancers who presented 11/6 for dyspnea, orthopnea, leg swelling and 7 lbs weight gain. CXR showed cardiomegaly, small left pleural effusion, and interstitial fibrosis. BNP elevated to 1720. Troponin 0.03. EKG with v-paced rhythm. Hemoglobin was also noted to be acutely low, though he denies overt GI bleeding. Lasix '40mg'$  IV was given and patient was admitted for CHF exacerbation and symptomatic anemia work up.   Assessment & Plan: Active Problems:   Atrial fibrillation (HCC)   Pacemaker   On home oxygen therapy   Chronic respiratory failure (HCC)   Coronary artery disease involving bypass graft of transplanted heart without angina pectoris   CHF exacerbation (HCC)   Anemia   CHF (congestive heart failure) (HCC)  Acute exacerbation of chronic systolic heart failure: Last echo 2014 with EF 20-25%, mild LVH, and anteroseptal akinesis. He is clinically mildly hypoervolemic with elevated BNP and edema on CXR. Remains dyspneic above baseline. Troponins negative. Weight on admission 132lbs. Presumed EDW ~125-130lbs. - Continue IV diuresis: lasix '40mg'$  IV BID, good UOP overnight - Monitor daily weight, I/O, BMP - Update echocardiogram - Continue beta blocker, hold ACE inhibitor  Acute on chronic anemia: Normocytic, iron studies essentially normal with Tsat 19%. +FOBT but denies overt GI bleeding. History of prostate and lung CA's, ?history of colon CA. - Hgb 8.5 > 7.7 overnight: Given significant CVD and dyspnea, will transfuse 1u PRBCs with  ongoing close monitoring for volume overload. Repeat CBC post transfusion and in AM. - Hold xarelto for suspicion of GI bleed - Repeat FOBT - Check CEA - CT abd/pelvis - Consult [facility], Dr. Amedeo Plenty (EGD with [facility] in 2012 showed gastritis and hiatal hernia)  AKI on CKD stage III: Baseline Cr ~1.1-1.2, 1.31 on admission.  - Hold ACE inhibitor - Hold reduced-dose xarelto - Monitor on diuretic therapy and s/p contrast for CT 11/7  AFib s/p PPM: CHADS-Vasc score of at least 5, anticoagulated with xarelto. Also has history of symptomatic bradycardia. Digoxin discontinued at last admission due to CKD/age.  - Continue beta blocker - Telemetry  CAD s/p CABG 2004: Nuclear stress test in Aug 2017 showed large fixed defect without ischemia. - Continue BB, imdur - Holding ACE and xarelto - ?not on a statin - will restart lipitor, which was started at last admission (ACS r/o)  History of DM: HbA1c 5.4% in Aug 2017.  - Monitor CBG with BMPs - Carb modified, heart-healthy diet  History of falls: With recent right periorbital bruising.  - PT/OT  DVT prophylaxis: xarelto Code Status: DNR Family Communication: None at bedside Disposition Plan: Inpatient management for IV diuresis and anemia work up.  Consultants:   GI, Dr. Amedeo Plenty  Procedures:   Echocardiogram ordered  Antimicrobials:  None   Subjective: Pt still feels short of breath worse than baseline, generally weak. No chest pain.   Objective: Vitals:   01/17/16 0808 01/17/16 1106 01/17/16 1107 01/17/16 1146  BP: 133/66 112/63  (!) 132/59  Pulse: 64  61 64  Resp: 18   18  Temp: 97.6 F (36.4 C)   97.8 F (36.6  C)  TempSrc: Oral   Oral  SpO2: 100%   100%  Weight:      Height:        Intake/Output Summary (Last 24 hours) at 01/17/16 1433 Last data filed at 01/17/16 1333  Gross per 24 hour  Intake             1162 ml  Output             1900 ml  Net             -738 ml   Filed Weights   01/16/16 2005 01/17/16  0605  Weight: 59.3 kg (130 lb 12.8 oz) 58.4 kg (128 lb 12.8 oz)    Examination: General exam: 82 y.o. male in no distress HEENT: Periorbital right eye ecchymosis Respiratory system: Non-labored breathing 2.5L O2. Diminished air entry bilaterally with + bibasilar crackles. Cardiovascular system: Regular rate and rhythm. No murmur, rub, or gallop. No JVD, and no pedal edema. Gastrointestinal system: Abdomen soft, non-tender, non-distended, with normoactive bowel sounds. No organomegaly or masses felt. Central nervous system: Alert and oriented. No focal neurological deficits. Extremities: Warm, no deformities Skin: No rashes, lesions no ulcers Psychiatry: Judgement and insight appear normal. Mood & affect appropriate.   Data Reviewed: I have personally reviewed following labs and imaging studies  CBC:  Recent Labs Lab 01/16/16 1425 01/17/16 0323  WBC 3.5* 1.5*  HGB 8.5* 7.7*  HCT 26.7* 23.6*  MCV 99.3 97.5  PLT 139* 676*   Basic Metabolic Panel:  Recent Labs Lab 01/16/16 1425 01/17/16 0323  NA 133* 135  K 4.7 4.5  CL 97* 95*  CO2 26 28  GLUCOSE 106* 140*  BUN 21* 27*  CREATININE 1.31* 1.34*  CALCIUM 9.2 9.0   GFR: Estimated Creatinine Clearance: 35.7 mL/min (by C-G formula based on SCr of 1.34 mg/dL (H)). Liver Function Tests:  Recent Labs Lab 01/16/16 2203  AST 21  ALT 21  ALKPHOS 105  BILITOT 1.7*  PROT 7.5  ALBUMIN 3.4*   No results for input(s): LIPASE, AMYLASE in the last 168 hours. No results for input(s): AMMONIA in the last 168 hours. Coagulation Profile: No results for input(s): INR, PROTIME in the last 168 hours. Cardiac Enzymes:  Recent Labs Lab 01/16/16 2203 01/17/16 0323 01/17/16 1107  TROPONINI <0.03 <0.03 <0.03   BNP (last 3 results) No results for input(s): PROBNP in the last 8760 hours. HbA1C: No results for input(s): HGBA1C in the last 72 hours. CBG:  Recent Labs Lab 01/16/16 2330 01/17/16 0723 01/17/16 1306  GLUCAP  126* 139* 115*   Lipid Profile: No results for input(s): CHOL, HDL, LDLCALC, TRIG, CHOLHDL, LDLDIRECT in the last 72 hours. Thyroid Function Tests: No results for input(s): TSH, T4TOTAL, FREET4, T3FREE, THYROIDAB in the last 72 hours. Anemia Panel:  Recent Labs  01/17/16 1107  FERRITIN 701*  TIBC 280  IRON 54   Urine analysis:    Component Value Date/Time   COLORURINE YELLOW 11/10/2014 1815   APPEARANCEUR CLEAR 11/10/2014 1815   LABSPEC 1.025 11/10/2014 1815   LABSPEC 1.015 05/03/2011 0950   PHURINE 7.0 11/10/2014 1815   GLUCOSEU NEGATIVE 11/10/2014 1815   HGBUR NEGATIVE 11/10/2014 1815   BILIRUBINUR NEGATIVE 11/10/2014 1815   BILIRUBINUR Color Interference 05/03/2011 0950   KETONESUR NEGATIVE 11/10/2014 1815   PROTEINUR 30 (A) 11/10/2014 1815   UROBILINOGEN 0.2 11/10/2014 1815   NITRITE NEGATIVE 11/10/2014 1815   LEUKOCYTESUR NEGATIVE 11/10/2014 1815   LEUKOCYTESUR Color Interference 05/03/2011 0950  Sepsis Labs: '@LABRCNTIP'$ (procalcitonin:4,lacticidven:4)  ) Recent Results (from the past 240 hour(s))  MRSA PCR Screening     Status: None   Collection Time: 01/17/16  3:34 AM  Result Value Ref Range Status   MRSA by PCR NEGATIVE NEGATIVE Final    Comment:        The GeneXpert MRSA Assay (FDA approved for NASAL specimens only), is one component of a comprehensive MRSA colonization surveillance program. It is not intended to diagnose MRSA infection nor to guide or monitor treatment for MRSA infections.      Radiology Studies: Dg Chest 2 View  Result Date: 01/16/2016 CLINICAL DATA:  Central and left chest pain for the past 2 days. Ex-smoker. EXAM: CHEST  2 VIEW COMPARISON:  10/26/2015. FINDINGS: Mildly progressive enlargement of the cardiac silhouette. Stable post CABG changes and left subclavian AICD and pacemaker leads. A right subclavian pacemaker lead is also unchanged. Prominence of the interstitial markings and right mid lung zone scarring are unchanged.  Small left pleural effusion without significant change. Thoracolumbar spine degenerative changes. IMPRESSION: 1. Mildly progressive cardiomegaly. 2. Stable small left pleural effusion. 3. Stable interstitial fibrosis and right lung scarring. Electronically Signed   By: Claudie Revering M.D.   On: 01/16/2016 15:13    Scheduled Meds: . allopurinol  300 mg Oral Daily  . benzonatate  100 mg Oral Q8H  . furosemide  40 mg Intravenous Q12H  . insulin aspart  0-9 Units Subcutaneous TID WC  . isosorbide mononitrate  30 mg Oral Daily  . lisinopril  10 mg Oral Daily  . metoprolol succinate  25 mg Oral Daily  . Rivaroxaban  15 mg Oral Q supper  . sodium chloride flush  3 mL Intravenous Q12H   Continuous Infusions:   LOS: 1 day   Time spent: 25 minutes.  Vance Gather, MD [facility] Pager [phone]  If 7PM-7AM, please contact night-coverage [email or web address] Password [facility] 01/17/2016, 2:33 PM

## 2016-01-17 NOTE — Consult Note (Signed)
   [facility] CM Inpatient Consult   01/17/2016  [name] 06/11/1932 [id number]   Referral received to assess for care management services for HF exacerbation, recent fall, and COPD with 2 admissions in the past 6 months.  Chart review reveals the patient per MD notes is [name] is a 83 y.o. gentleman with a history of systolic heart failure, CAD S/P CABG, COPD with chronic respiratory failure (he is on 2.5L [place] at baseline), atrial fibrillation, CKD stage  3, HTN, DM, PPM (former Bi-V ICD), and colon and prostate cancers who presents for evaluation of 2 days of progressive shortness of breath at rest, DOE, and orthopnea/PND.  Patient reports that he has not slept for the past two nights because of the severity of his symptoms.  He estimates that he has gained 7 lbs.  Urine output has been stable.  No chest pain or dizziness with this.  He has not had an LOC.    Met with the patient regarding the benefits of [facility] Care Management services.  Patient denies any difficulties with medications, transportation or follow up with primary care provider. He endorses Dr. James Kim.  He has a hospital bed, walkers, and oxygen.  He states, "I don't need anything, I am doing ok as long as I get my breathing right.  My neighbors check on me and I am fine."   Explained that [facility] Care Management is a covered benefit of insurance. Review information for [facility] Management and a brochure with 24 hour nurse advise line was provided with contact information.  Explained that [facility] Management does not interfere with or replace any services arranged by the inpatient care management staff.  Patient declined services with [facility] Management states, "I will show this to my daughter and if she thinks we need anything else she will call you." For questions, please contact:  Victoria Brewer, RN BSN CCM [facility] Liaison  [phone] business mobile phone Toll free office [phone]    

## 2016-01-17 NOTE — Progress Notes (Signed)
Instructed by Dr. Auburn Bilberry to collect CBC 2 hours after blood transfusion is completed and ok to give lasix scheduled at 1800 after blood tx.  Karie Kirks, Therapist, sports.

## 2016-01-18 ENCOUNTER — Inpatient Hospital Stay (HOSPITAL_COMMUNITY): Payer: Medicare Other

## 2016-01-18 DIAGNOSIS — I5043 Acute on chronic combined systolic (congestive) and diastolic (congestive) heart failure: Secondary | ICD-10-CM | POA: Diagnosis present

## 2016-01-18 DIAGNOSIS — I4891 Unspecified atrial fibrillation: Secondary | ICD-10-CM

## 2016-01-18 DIAGNOSIS — I509 Heart failure, unspecified: Secondary | ICD-10-CM

## 2016-01-18 LAB — GLUCOSE, CAPILLARY
GLUCOSE-CAPILLARY: 91 mg/dL (ref 65–99)
GLUCOSE-CAPILLARY: 93 mg/dL (ref 65–99)
Glucose-Capillary: 91 mg/dL (ref 65–99)
Glucose-Capillary: 97 mg/dL (ref 65–99)

## 2016-01-18 LAB — ECHOCARDIOGRAM COMPLETE
AO mean calculated velocity dopler: 143 cm/s
AOPV: 0.19 m/s
AV Area VTI: 0.79 cm2
AV Mean grad: 9 mmHg
AV Peak grad: 19 mmHg
AV vel: 0.81
AVAREAMEANV: 0.74 cm2
AVAREAMEANVIN: 0.45 cm2/m2
AVAREAVTIIND: 0.5 cm2/m2
AVCELMEANRAT: 0.18
AVLVOTPG: 1 mmHg
AVPKVEL: 220 cm/s
CHL CUP AV PEAK INDEX: 0.48
CHL CUP AV VALUE AREA INDEX: 0.5
CHL CUP TV REG PEAK VELOCITY: 195 cm/s
FS: 7 % — AB (ref 28–44)
Height: 65 in
IV/PV OW: 1
LA diam index: 3.18 cm/m2
LASIZE: 52 mm
LAVOL: 147 mL
LAVOLA4C: 119 mL
LAVOLIN: 89.9 mL/m2
LDCA: 4.15 cm2
LEFT ATRIUM END SYS DIAM: 52 mm
LV PW d: 10 mm — AB (ref 0.6–1.1)
LV TDI E'MEDIAL: 3.66
LV e' LATERAL: 5.57 cm/s
LVOT SV: 33 mL
LVOT VTI: 7.87 cm
LVOTD: 23 mm
LVOTPV: 41.8 cm/s
LVOTVTI: 0.2 cm
RV LATERAL S' VELOCITY: 5.01 cm/s
RV sys press: 18 mmHg
TDI e' lateral: 5.57
TRMAXVEL: 195 cm/s
VTI: 40.1 cm
Valve area: 0.81 cm2
Weight: 2056 oz

## 2016-01-18 LAB — BASIC METABOLIC PANEL
ANION GAP: 10 (ref 5–15)
BUN: 40 mg/dL — ABNORMAL HIGH (ref 6–20)
CHLORIDE: 92 mmol/L — AB (ref 101–111)
CO2: 31 mmol/L (ref 22–32)
CREATININE: 1.59 mg/dL — AB (ref 0.61–1.24)
Calcium: 9 mg/dL (ref 8.9–10.3)
GFR calc non Af Amer: 39 mL/min — ABNORMAL LOW (ref >60)
GFR, EST AFRICAN AMERICAN: 45 mL/min — AB (ref >60)
Glucose, Bld: 92 mg/dL (ref 65–99)
POTASSIUM: 4.4 mmol/L (ref 3.5–5.1)
SODIUM: 133 mmol/L — AB (ref 135–145)

## 2016-01-18 LAB — CBC
HEMATOCRIT: 30 % — AB (ref 39.0–52.0)
HEMOGLOBIN: 9.9 g/dL — AB (ref 13.0–17.0)
MCH: 31.3 pg (ref 26.0–34.0)
MCHC: 33 g/dL (ref 30.0–36.0)
MCV: 94.9 fL (ref 78.0–100.0)
PLATELETS: 146 10*3/uL — AB (ref 150–400)
RBC: 3.16 MIL/uL — AB (ref 4.22–5.81)
RDW: 18.5 % — ABNORMAL HIGH (ref 11.5–15.5)
WBC: 3.6 10*3/uL — AB (ref 4.0–10.5)

## 2016-01-18 LAB — TYPE AND SCREEN
ABO/RH(D): B POS
Antibody Screen: NEGATIVE
Unit division: 0

## 2016-01-18 MED ORDER — PERFLUTREN LIPID MICROSPHERE
1.0000 mL | INTRAVENOUS | Status: AC | PRN
  Administered 2016-01-18: 2 mL via INTRAVENOUS
  Filled 2016-01-18: qty 10

## 2016-01-18 MED ORDER — PERFLUTREN LIPID MICROSPHERE
INTRAVENOUS | Status: AC
  Filled 2016-01-18: qty 10

## 2016-01-18 MED ORDER — FUROSEMIDE 20 MG PO TABS: 20.0000 mg | ORAL_TABLET | Freq: Every day | ORAL | Status: DC

## 2016-01-18 MED ORDER — SPIRONOLACTONE 25 MG PO TABS
12.5000 mg | ORAL_TABLET | Freq: Every day | ORAL | Status: DC
  Administered 2016-01-18 – 2016-01-20 (×3): 12.5 mg via ORAL
  Filled 2016-01-18 (×3): qty 1

## 2016-01-18 NOTE — Progress Notes (Signed)
Pt not in room again. Appropriate Hb response to transfusion. Will return later

## 2016-01-18 NOTE — Care Management Note (Signed)
Case Management Note  Patient Details  Name: [name] MRN: [id number] Date of Birth: 06-16-1932  Subjective/Objective:    Admitted with CHF                Action/Plan: Patient lives at home with his daughter [name], she takes him to all of his apts. PCP is Dr Maudie Mercury; has private insurance with [facility] with prescription drug coverage; pharmacy of choice is [facility]. Pt gave CM permission to talk to his daughter via phone - she picks up all of his medication, he has no problem getting his medication. She cooks meals without any salt, heart healthy diet. He has scales and weighs himself twice a week and the reading goes to someone with [facility]. [name] stated that he doesn't weigh himself daily due to kittens in the home sleep on the scale. DME - home oxygen through [place], no other DME at this time. CM will continue to follow for DCP.  Expected Discharge Date:   Possibly 01/20/2016               Expected Discharge Plan:  Home/Self Care  In-House Referral:   [facility]  Discharge planning Services  CM Consult  Status of Service:  In process, will continue to follow  Sherrilyn Rist [phone] 01/18/2016, 3:27 PM

## 2016-01-18 NOTE — Progress Notes (Signed)
PROGRESS NOTE  [name] HYW:[id number] DOB: 1932/12/08 DOA: 01/16/2016 PCP: [name], MD  Brief History:  82 y.o.gentleman with a history of systolic heart failure, CAD s/p CABG, COPD with chronic respiratory failure (on 2.5L [place] at baseline), atrial fibrillation, CKD 3, HTN, DM, PPM (former Bi-V ICD), lung and prostate cancers who presented 11/6 for dyspnea, orthopnea, leg swelling and 7 lbs weight gain. CXR showed cardiomegaly, small left pleural effusion, and interstitial fibrosis.BNP elevated to 1720. Troponin 0.03.EKG with v-paced rhythm. Hemoglobin was also noted to be acutely low, though he denies overt GI bleeding.Lasix '40mg'$  IV was given and patient was admitted for CHF exacerbation and symptomatic anemia work up.  Assessment/Plan: Acute exacerbation of chronic systolic heart failure: Last echo 2014 with EF 20-25%, mild LVH, and anteroseptal akinesis. He is clinically mildly hypoervolemic with elevated BNP and edema on CXR. Remains dyspneic above baseline. Troponins negative. Weight on admission 132lbs. Presumed EDW ~125-130lbs. - Continue IV diuresis-->switch to po lasix 01/19/16 am -NEG 4 lbs, Neg 2.7 L  -now able to lay flat - Monitor daily weight, I/O, BMP - 01/18/16 echocardiogram--EF 10-15%, diffuse HK, reduced RV function - Continue beta blocker, hold ACE inhibitor - add low dose spironolactone if BP able to tolerate  Acute on chronic anemia: Normocytic, iron studies essentially normal with Tsat 19%. -baseline Hgb 9-10 +FOBT but denies overt GI bleeding. History of prostate and lung CA's,  - Hgb 8.5 > 7.7 overnight 11/6-11/7: Given significant CVD and dyspnea, -transfused one unit PRBC for the admission - Hold xarelto for suspicion of GI bleed - Repeat FOBT - Check CEA--pending - Appreciate Dr. Amedeo Plenty (EGD with [facility] in 2012 showed gastritis and hiatal hernia) -01/17/16--CT abd--few distended fluid filled SB loops w/o acute inflammation or bowel obstruction,  new small bilateral pleural effusion  CKD stage III: Baseline Cr ~1.1-1.3,  -serum creatinine 1.31 on admission.  - Hold ACE inhibitor - Hold reduced-dose xarelto - Monitor on diuretic therapy and s/p contrast for CT 11/7  Chronic respiratory failure with hypoxia -normally on 2.5 L at home -presently stable on 2.5 L  AFib s/p ACID:  -CHADS-Vasc score of at least 5, anticoagulated with xarelto. Also has history of symptomatic bradycardia.  -Digoxin discontinued at last admission due to CKD/age.  - Continue beta blocker - Telemetry  CAD s/p CABG 2004:  -Nuclear stress test in Aug. 17, 2017 showed large fixed defect without ischemia but no reversible ischemia per cardiology  - Continue BB, imdur - Holding ACE and xarelto - ?not on a statin - restart lipitor, which was started at last admission (ACS r/o)  History of DM:  -HbA1c 5.4% in Aug 2017.  - Monitor CBG with BMPs - Carb modified, heart-healthy diet  History of falls: With recent right periorbital bruising.  - PT/OT-->HHPT for safety assessment    Disposition Plan:   Home in 1-2 days  Family Communication:  No Family at bedside  Consultants:  none  Code Status:  DNR  DVT Prophylaxis:  SCDs   Procedures: As Listed in Progress Note Above  Antibiotics: None    Subjective: Patient denies fevers, chills, headache, chest pain, dyspnea, nausea, vomiting, diarrhea, abdominal pain, dysuria, hematuria, hematochezia, and melena.  Breathing better and now able to lay flat   Objective: Vitals:   01/17/16 1922 01/18/16 0313 01/18/16 0500 01/18/16 1200  BP: 126/74  108/61 (!) 95/56  Pulse: 63  (!) 59 64  Resp: 20  20  20  Temp: 97.8 F (36.6 C)  98.3 F (36.8 C) 98.5 F (36.9 C)  TempSrc: Oral  Oral Oral  SpO2: 97%  100% 100%  Weight:  58.3 kg (128 lb 8 oz)    Height:        Intake/Output Summary (Last 24 hours) at 01/18/16 1659 Last data filed at 01/18/16 0900  Gross per 24 hour  Intake              1645 ml  Output             4025 ml  Net            -2380 ml   Weight change: -1.043 kg (-2 lb 4.8 oz) Exam:   General:  Pt is alert, follows commands appropriately, not in acute distress  HEENT: No icterus, No thrush, No neck mass, [place]/AT  Cardiovascular: IRRR, S1/S2, no rubs, no gallops  Respiratory: bibasilar crackles, no wheeze  Abdomen: Soft/+BS, non tender, non distended, no guarding  Extremities: No edema, No lymphangitis, No petechiae, No rashes, no synovitis   Data Reviewed: I have personally reviewed following labs and imaging studies Basic Metabolic Panel:  Recent Labs Lab 01/16/16 1425 01/17/16 0323 01/18/16 0334  NA 133* 135 133*  K 4.7 4.5 4.4  CL 97* 95* 92*  CO2 '26 28 31  '$ GLUCOSE 106* 140* 92  BUN 21* 27* 40*  CREATININE 1.31* 1.34* 1.59*  CALCIUM 9.2 9.0 9.0   Liver Function Tests:  Recent Labs Lab 01/16/16 2203  AST 21  ALT 21  ALKPHOS 105  BILITOT 1.7*  PROT 7.5  ALBUMIN 3.4*   No results for input(s): LIPASE, AMYLASE in the last 168 hours. No results for input(s): AMMONIA in the last 168 hours. Coagulation Profile: No results for input(s): INR, PROTIME in the last 168 hours. CBC:  Recent Labs Lab 01/16/16 1425 01/17/16 0323 01/17/16 2220 01/18/16 0334  WBC 3.5* 1.5* 3.6* 3.6*  HGB 8.5* 7.7* 9.2* 9.9*  HCT 26.7* 23.6* 27.9* 30.0*  MCV 99.3 97.5 94.6 94.9  PLT 139* 130* 147* 146*   Cardiac Enzymes:  Recent Labs Lab 01/16/16 2203 01/17/16 0323 01/17/16 1107  TROPONINI <0.03 <0.03 <0.03   BNP: Invalid input(s): POCBNP CBG:  Recent Labs Lab 01/17/16 1643 01/17/16 2120 01/18/16 0554 01/18/16 1131 01/18/16 1636  GLUCAP 115* 116* 91 91 93   HbA1C: No results for input(s): HGBA1C in the last 72 hours. Urine analysis:    Component Value Date/Time   COLORURINE YELLOW 11/10/2014 1815   APPEARANCEUR CLEAR 11/10/2014 1815   LABSPEC 1.025 11/10/2014 1815   LABSPEC 1.015 05/03/2011 0950   PHURINE 7.0 11/10/2014  1815   GLUCOSEU NEGATIVE 11/10/2014 1815   HGBUR NEGATIVE 11/10/2014 1815   BILIRUBINUR NEGATIVE 11/10/2014 1815   BILIRUBINUR Color Interference 05/03/2011 0950   KETONESUR NEGATIVE 11/10/2014 1815   PROTEINUR 30 (A) 11/10/2014 1815   UROBILINOGEN 0.2 11/10/2014 1815   NITRITE NEGATIVE 11/10/2014 1815   LEUKOCYTESUR NEGATIVE 11/10/2014 1815   LEUKOCYTESUR Color Interference 05/03/2011 0950   Sepsis Labs: '@LABRCNTIP'$ (procalcitonin:4,lacticidven:4) ) Recent Results (from the past 240 hour(s))  MRSA PCR Screening     Status: None   Collection Time: 01/17/16  3:34 AM  Result Value Ref Range Status   MRSA by PCR NEGATIVE NEGATIVE Final    Comment:        The GeneXpert MRSA Assay (FDA approved for NASAL specimens only), is one component of a comprehensive MRSA colonization surveillance program. It is not  intended to diagnose MRSA infection nor to guide or monitor treatment for MRSA infections.      Scheduled Meds: . allopurinol  300 mg Oral Daily  . atorvastatin  40 mg Oral q1800  . benzonatate  100 mg Oral Q8H  . [START ON 01/19/2016] furosemide  20 mg Oral Daily  . isosorbide mononitrate  30 mg Oral Daily  . metoprolol succinate  25 mg Oral Daily  . sodium chloride flush  3 mL Intravenous Q12H  . spironolactone  12.5 mg Oral Daily   Continuous Infusions:  Procedures/Studies: Dg Chest 2 View  Result Date: 01/16/2016 CLINICAL DATA:  Central and left chest pain for the past 2 days. Ex-smoker. EXAM: CHEST  2 VIEW COMPARISON:  10/26/2015. FINDINGS: Mildly progressive enlargement of the cardiac silhouette. Stable post CABG changes and left subclavian AICD and pacemaker leads. A right subclavian pacemaker lead is also unchanged. Prominence of the interstitial markings and right mid lung zone scarring are unchanged. Small left pleural effusion without significant change. Thoracolumbar spine degenerative changes. IMPRESSION: 1. Mildly progressive cardiomegaly. 2. Stable small left  pleural effusion. 3. Stable interstitial fibrosis and right lung scarring. Electronically Signed   By: Claudie Revering M.D.   On: 01/16/2016 15:13   Ct Abdomen Pelvis W Contrast  Result Date: 01/18/2016 CLINICAL DATA:  Colon cancer.  History of gallstones and hematuria. EXAM: CT ABDOMEN AND PELVIS WITH CONTRAST TECHNIQUE: Multidetector CT imaging of the abdomen and pelvis was performed using the standard protocol following bolus administration of intravenous contrast. CONTRAST:  41m ISOVUE-300 IOPAMIDOL (ISOVUE-300) INJECTION 61% COMPARISON:  11/10/2014 FINDINGS: Lower chest: Small right greater than left pleural effusions. Calcified pleural plaque on the left posteriorly. Stable cardiomegaly with right atrial, coronary sinus and right ventricular leads noted. Chronic rounded atelectasis at the left lung base posteriorly. Bibasilar dependent and subsegmental atelectasis. Hepatobiliary: Gallbladder is contracted. No gallstones are seen. No space-occupying mass of liver. No ductal dilatation. Pancreas: No pancreatic ductal dilatation or focal mass. Spleen: Normal Adrenals/Urinary Tract: Normal adrenal glands. Right lower pole nonobstructing 8.5 mm renal stone. Stable left lower pole 3 cm exophytic renal cyst with post contrast Hounsfield unit of 29 unchanged from prior. Mural nodular density suggested on prior exam is not as apparent on this exam. This exophytic cyst again demonstrates peripheral thin mural calcifications. Stomach/Bowel: The stomach is distended with contrast and fluid. There is no bowel obstruction or acute inflammatory process involving small nor large bowel. There is mild diffuse distention of small intestine approximately, nonspecific in etiology. Mild enteritis is not excluded. Circular muscle hypertrophy and sigmoid diverticulosis noted as before. Vascular/Lymphatic: Aortoiliac and branch vessel atherosclerosis without aneurysm or dissection. No lymphadenopathy. Reproductive: Radiation seeds  noted of the normal size prostate. Other: No free air or free fluid. Musculoskeletal: Schmorl's node involving the superior endplate of L4 with degenerative disc disease L2-3 and L4-5. Multilevel osteophytes most prominent from T9 through T12. IMPRESSION: There are a few mildly distended fluid filled small bowel loops without evidence of acute inflammation or bowel obstruction. Mild ileus or enteritis could have this appearance of nonspecific. Sigmoid diverticulosis with circular muscle hypertrophy. Complex lower pole 3 cm exophytic cyst with thin peripheral calcifications suggesting a Bosniak category 2 renal cyst. Nonobstructing stable right lower pole 8.5 mm renal calculus. New small bilateral pleural effusions. Left-sided calcified pleural plaque consistent with prior asbestos exposure. Electronically Signed   By: DAshley RoyaltyM.D.   On: 01/18/2016 02:04    Debbrah Sampedro, DO  [facility] Pager  [phone]  If 7PM-7AM, please contact night-coverage [email or web address] Password [email or web address] 01/18/2016, 4:59 PM   LOS: 2 days

## 2016-01-18 NOTE — Progress Notes (Signed)
  Echocardiogram 2D Echocardiogram with Definity has been performed.  Mate Alegria 01/18/2016, 1:16 PM

## 2016-01-18 NOTE — Evaluation (Signed)
Occupational Therapy Evaluation Patient Details Name: [name] MRN: [id number] DOB: 29-May-1932 Today's Date: 01/18/2016    History of Present Illness Pt is an 82 y/o male admitted secondary to dyspnea and weight gain. PMH including but not limited to CHF, CAD s/p CABG in 2004, COPD, HTN, A-fib, CKD, hx of CVA, colon cancer and prostate cancer.   Clinical Impression   No family available to offer information about pt's PLOF, but functioning currently at a supervision level in mobility and ADL. Pt demonstrates impaired safety awareness and states he does not comply with weighing himself and limiting his liquid intake because he doesn't want to. Pt not interested in DME to increase safety.  No further OT needs.    Follow Up Recommendations  No OT follow up    Equipment Recommendations  None recommended by OT    Recommendations for Other Services       Precautions / Restrictions Precautions Precautions: Fall Restrictions Weight Bearing Restrictions: No      Mobility Bed Mobility Overal bed mobility: Needs Assistance Bed Mobility: Supine to Sit;Sit to Supine     Supine to sit: Min guard Sit to supine: Min guard   General bed mobility comments: pt required increased time, min guard for safety  Transfers Overall transfer level: Needs assistance Equipment used: None Transfers: Sit to/from Stand Sit to Stand: Supervision         General transfer comment: pt required increased time and supervision for safety    Balance Overall balance assessment: Needs assistance;History of Falls Sitting-balance support: Feet supported;No upper extremity supported Sitting balance-Leahy Scale: Fair     Standing balance support: During functional activity;No upper extremity supported Standing balance-Leahy Scale: Fair Standing balance comment: pt able to stand statically without bilateral UE supports. pt also able to ambulate within his room without use of AD but frequently  reaching for support surfaces with one UE support.                            ADL Overall ADL's : At baseline                                       General ADL Comments: supervision for safety and fall prevention, pt is not interested in equipment for seated showering      Vision     Perception     Praxis      Pertinent Vitals/Pain Pain Assessment: No/denies pain     Hand Dominance Right   Extremity/Trunk Assessment Upper Extremity Assessment Upper Extremity Assessment: RUE deficits/detail;LUE deficits/detail LUE Deficits / Details: full ROM, baseline weakness in shoulder, hx of RCR   Lower Extremity Assessment Lower Extremity Assessment: Overall WFL for tasks assessed       Communication Communication Communication: HOH   Cognition Arousal/Alertness: Awake/alert Behavior During Therapy: WFL for tasks assessed/performed Overall Cognitive Status: No family/caregiver present to determine baseline cognitive functioning Area of Impairment: Memory;Following commands;Safety/judgement;Awareness;Problem solving     Memory: Decreased short-term memory Following Commands: Follows one step commands consistently;Follows multi-step commands inconsistently Safety/Judgement: Decreased awareness of safety;Decreased awareness of deficits Awareness: Emergent Problem Solving: Slow processing;Requires verbal cues     General Comments       Exercises       Shoulder Instructions      Home Living Family/patient expects to be discharged to:: Private residence Living  Arrangements: Children (daughter) Available Help at Discharge: Family;Available PRN/intermittently Type of Home: House Home Access: Level entry     Home Layout: One level     Bathroom Shower/Tub: Teacher, early years/pre: Standard     Home Equipment: Environmental consultant - 2 wheels;Walker - 4 wheels;Hospital bed;Other (comment) (02)          Prior Functioning/Environment Level  of Independence: Independent        Comments: pt stated that he has multiple RW's but does not use them in his home, hx of falls due to his cat tripping him, gets down in tub to bathe         OT Problem List: Impaired balance (sitting and/or standing);Decreased cognition;Decreased knowledge of use of DME or AE;Decreased safety awareness   OT Treatment/Interventions:      OT Goals(Current goals can be found in the care plan section) Acute Rehab OT Goals Patient Stated Goal: return home  OT Frequency:     Barriers to D/C:            Co-evaluation              End of Session Equipment Utilized During Treatment: Gait belt;Oxygen  Activity Tolerance: Patient tolerated treatment well Patient left: in bed;with call bell/phone within reach;with bed alarm set (MD in room)   Time: 1103-1594 OT Time Calculation (min): 17 min Charges:  OT General Charges $OT Visit: 1 Procedure OT Evaluation $OT Eval Moderate Complexity: 1 Procedure G-Codes:    Malka So 01/18/2016, 3:53 PM  [phone]

## 2016-01-18 NOTE — Evaluation (Signed)
Physical Therapy Evaluation Patient Details Name: [name] MRN: [id number] DOB: 07/06/1932 Today's Date: 01/18/2016   History of Present Illness  Pt is an 82 y/o male admitted secondary to dyspnea and weight gain. PMH including but not limited to CHF, CAD s/p CABG in 2004, COPD, HTN, A-fib, CKD, hx of CVA, colon cancer and prostate cancer.  Clinical Impression  Pt presented sitting on toilet in his bathroom alone. Prior to admission, pt stated that he was independent with all functional mobility and ADLs. Pt reported that he has three RW's but does not use them. Pt moving cautiously during evaluation with min guard to supervision within his room. Pt did not have any instability or LOB during ambulation with no use of AD; however, he frequently reached for support surface with one UE. Pt would continue to benefit from skilled physical therapy services at this time while admitted to address his below listed limitations in order to improve his overall safety and independence with functional mobility.     Follow Up Recommendations Supervision for mobility/OOB;Home health PT (for safety assessment)    Equipment Recommendations  None recommended by PT    Recommendations for Other Services       Precautions / Restrictions Precautions Precautions: Fall Restrictions Weight Bearing Restrictions: No      Mobility  Bed Mobility Overal bed mobility: Needs Assistance Bed Mobility: Sit to Supine       Sit to supine: Min guard   General bed mobility comments: pt required increased time, min guard for safety  Transfers Overall transfer level: Needs assistance Equipment used: None Transfers: Sit to/from Stand Sit to Stand: Supervision         General transfer comment: pt required increased time and supervision for safety  Ambulation/Gait Ambulation/Gait assistance: Min guard Ambulation Distance (Feet): 20 Feet Assistive device: None Gait Pattern/deviations: Step-through  pattern;Decreased step length - right;Decreased step length - left;Decreased stride length Gait velocity: decreased   General Gait Details: pt did not demonstrate any instability or LOB during ambulation; however, movement was decreased and pt occasionally reaching out with one UE to support surfaces.  Stairs            Wheelchair Mobility    Modified Rankin (Stroke Patients Only)       Balance Overall balance assessment: Needs assistance;History of Falls Sitting-balance support: Feet supported;No upper extremity supported Sitting balance-Leahy Scale: Fair     Standing balance support: During functional activity;No upper extremity supported Standing balance-Leahy Scale: Fair Standing balance comment: pt able to stand statically without bilateral UE supports. pt also able to ambulate within his room without use of AD but frequently reaching for support surfaces with one UE support.                             Pertinent Vitals/Pain Pain Assessment: No/denies pain    Home Living Family/patient expects to be discharged to:: Private residence Living Arrangements: Children Available Help at Discharge: Family;Available PRN/intermittently Type of Home: House Home Access: Level entry     Home Layout: One level Home Equipment: Walker - 2 wheels;Walker - 4 wheels;Hospital bed      Prior Function Level of Independence: Independent         Comments: pt stated that he has multiple RW's but does not use them in his home.      Hand Dominance   Dominant Hand: Right    Extremity/Trunk Assessment   Upper Extremity  Assessment: LUE deficits/detail       LUE Deficits / Details: pt with decreased strength in L UE, s/p rotator cuff repair in 2010. 3+/5 for shoulder flexion.   Lower Extremity Assessment: Overall WFL for tasks assessed         Communication   Communication: HOH  Cognition Arousal/Alertness: Awake/alert Behavior During Therapy: WFL for  tasks assessed/performed Overall Cognitive Status: Impaired/Different from baseline Area of Impairment: Memory;Following commands;Safety/judgement;Awareness;Problem solving     Memory: Decreased short-term memory Following Commands: Follows one step commands consistently;Follows multi-step commands inconsistently Safety/Judgement: Decreased awareness of safety;Decreased awareness of deficits Awareness: Emergent Problem Solving: Slow processing;Requires verbal cues      General Comments      Exercises     Assessment/Plan    PT Assessment Patient needs continued PT services  PT Problem List Decreased strength;Decreased activity tolerance;Decreased balance;Decreased mobility;Decreased coordination;Decreased safety awareness          PT Treatment Interventions DME instruction;Gait training;Stair training;Functional mobility training;Therapeutic activities;Therapeutic exercise;Balance training;Neuromuscular re-education;Patient/family education    PT Goals (Current goals can be found in the Care Plan section)  Acute Rehab PT Goals Patient Stated Goal: return home PT Goal Formulation: With patient Time For Goal Achievement: 02/01/16 Potential to Achieve Goals: Good    Frequency Min 3X/week   Barriers to discharge        Co-evaluation               End of Session Equipment Utilized During Treatment: Oxygen (2.5 L O2 via [place]) Activity Tolerance: Patient limited by fatigue Patient left: in bed;with call bell/phone within reach;with bed alarm set;Other (comment) (OT in room to begin evaluation) Nurse Communication: Mobility status         Time: 1410-1430 PT Time Calculation (min) (ACUTE ONLY): 20 min   Charges:   PT Evaluation $PT Eval Moderate Complexity: 1 Procedure     PT G CodesClearnce Sorrel Zacharee Gaddie 01/18/2016, 2:48 PM Sherie Don, [place], DPT [phone]

## 2016-01-19 DIAGNOSIS — N183 Chronic kidney disease, stage 3 unspecified: Secondary | ICD-10-CM | POA: Diagnosis present

## 2016-01-19 DIAGNOSIS — I255 Ischemic cardiomyopathy: Secondary | ICD-10-CM | POA: Diagnosis present

## 2016-01-19 DIAGNOSIS — I5023 Acute on chronic systolic (congestive) heart failure: Secondary | ICD-10-CM

## 2016-01-19 DIAGNOSIS — I509 Heart failure, unspecified: Secondary | ICD-10-CM

## 2016-01-19 DIAGNOSIS — Z7901 Long term (current) use of anticoagulants: Secondary | ICD-10-CM

## 2016-01-19 LAB — BASIC METABOLIC PANEL
Anion gap: 12 (ref 5–15)
BUN: 37 mg/dL — AB (ref 6–20)
CALCIUM: 8.8 mg/dL — AB (ref 8.9–10.3)
CO2: 27 mmol/L (ref 22–32)
CREATININE: 1.35 mg/dL — AB (ref 0.61–1.24)
Chloride: 94 mmol/L — ABNORMAL LOW (ref 101–111)
GFR calc Af Amer: 55 mL/min — ABNORMAL LOW (ref >60)
GFR, EST NON AFRICAN AMERICAN: 48 mL/min — AB (ref >60)
Glucose, Bld: 88 mg/dL (ref 65–99)
Potassium: 4.1 mmol/L (ref 3.5–5.1)
SODIUM: 133 mmol/L — AB (ref 135–145)

## 2016-01-19 LAB — MAGNESIUM: MAGNESIUM: 2.2 mg/dL (ref 1.7–2.4)

## 2016-01-19 LAB — CBC
HCT: 31.9 % — ABNORMAL LOW (ref 39.0–52.0)
Hemoglobin: 10.5 g/dL — ABNORMAL LOW (ref 13.0–17.0)
MCH: 31.3 pg (ref 26.0–34.0)
MCHC: 32.9 g/dL (ref 30.0–36.0)
MCV: 95.2 fL (ref 78.0–100.0)
Platelets: 162 10*3/uL (ref 150–400)
RBC: 3.35 MIL/uL — ABNORMAL LOW (ref 4.22–5.81)
RDW: 17.9 % — ABNORMAL HIGH (ref 11.5–15.5)
WBC: 3.3 10*3/uL — ABNORMAL LOW (ref 4.0–10.5)

## 2016-01-19 LAB — GLUCOSE, CAPILLARY: Glucose-Capillary: 96 mg/dL (ref 65–99)

## 2016-01-19 LAB — CEA: CEA: 4.2 ng/mL (ref 0.0–4.7)

## 2016-01-19 LAB — BRAIN NATRIURETIC PEPTIDE: B Natriuretic Peptide: 680.1 pg/mL — ABNORMAL HIGH (ref 0.0–100.0)

## 2016-01-19 MED ORDER — FUROSEMIDE 10 MG/ML IJ SOLN
40.0000 mg | Freq: Every day | INTRAMUSCULAR | Status: DC
  Administered 2016-01-19: 40 mg via INTRAVENOUS
  Filled 2016-01-19: qty 4

## 2016-01-19 MED ORDER — FUROSEMIDE 40 MG PO TABS
40.0000 mg | ORAL_TABLET | Freq: Every day | ORAL | Status: DC
  Administered 2016-01-20: 40 mg via ORAL
  Filled 2016-01-19: qty 1

## 2016-01-19 MED ORDER — FUROSEMIDE 40 MG PO TABS
40.0000 mg | ORAL_TABLET | Freq: Every day | ORAL | Status: DC
  Administered 2016-01-19: 40 mg via ORAL
  Filled 2016-01-19: qty 1

## 2016-01-19 NOTE — Progress Notes (Signed)
[facility] Progress Note  Subjective: Patient without complaints.  Objective: Vital signs in last 24 hours: Temp:  [97.8 F (36.6 C)-98.5 F (36.9 C)] 97.8 F (36.6 C) (11/09 0537) Pulse Rate:  [64-68] 64 (11/09 0537) Resp:  [19-20] 19 (11/09 0537) BP: (95-115)/(50-63) 103/50 (11/09 0537) SpO2:  [97 %-100 %] 100 % (11/09 0537) Weight:  [56 kg (123 lb 8 oz)] 56 kg (123 lb 8 oz) (11/09 0537) Weight change: -2.268 kg (-5 lb)   PE: Abdomen soft nondistended, no mass  Lab Results: Results for orders placed or performed during the hospital encounter of 01/16/16 (from the past 24 hour(s))  Glucose, capillary     Status: None   Collection Time: 01/18/16 11:31 AM  Result Value Ref Range   Glucose-Capillary 91 65 - 99 mg/dL   Comment 1 Notify RN    Comment 2 Document in Chart   Glucose, capillary     Status: None   Collection Time: 01/18/16  4:36 PM  Result Value Ref Range   Glucose-Capillary 93 65 - 99 mg/dL   Comment 1 Notify RN    Comment 2 Document in Chart   Glucose, capillary     Status: None   Collection Time: 01/18/16  9:09 PM  Result Value Ref Range   Glucose-Capillary 97 65 - 99 mg/dL   Comment 1 Notify RN    Comment 2 Document in Chart   Basic metabolic panel     Status: Abnormal   Collection Time: 01/19/16  3:04 AM  Result Value Ref Range   Sodium 133 (L) 135 - 145 mmol/L   Potassium 4.1 3.5 - 5.1 mmol/L   Chloride 94 (L) 101 - 111 mmol/L   CO2 27 22 - 32 mmol/L   Glucose, Bld 88 65 - 99 mg/dL   BUN 37 (H) 6 - 20 mg/dL   Creatinine, Ser 1.35 (H) 0.61 - 1.24 mg/dL   Calcium 8.8 (L) 8.9 - 10.3 mg/dL   GFR calc non Af Amer 48 (L) >60 mL/min   GFR calc Af Amer 55 (L) >60 mL/min   Anion gap 12 5 - 15  CBC     Status: Abnormal   Collection Time: 01/19/16  3:04 AM  Result Value Ref Range   WBC 3.3 (L) 4.0 - 10.5 K/uL   RBC 3.35 (L) 4.22 - 5.81 MIL/uL   Hemoglobin 10.5 (L) 13.0 - 17.0 g/dL   HCT 31.9 (L) 39.0 - 52.0 %   MCV 95.2 78.0 - 100.0 fL   MCH 31.3 26.0 - 34.0 pg   MCHC 32.9 30.0 - 36.0 g/dL   RDW 17.9 (H) 11.5 - 15.5 %   Platelets 162 150 - 400 K/uL  Brain natriuretic peptide     Status: Abnormal   Collection Time: 01/19/16  3:04 AM  Result Value Ref Range   B Natriuretic Peptide 680.1 (H) 0.0 - 100.0 pg/mL  Magnesium     Status: None   Collection Time: 01/19/16  3:04 AM  Result Value Ref Range   Magnesium 2.2 1.7 - 2.4 mg/dL  Glucose, capillary     Status: None   Collection Time: 01/19/16  6:58 AM  Result Value Ref Range   Glucose-Capillary 96 65 - 99 mg/dL   Comment 1 Notify RN    Comment 2 Document in Chart     Studies/Results: Ct Abdomen Pelvis W Contrast  Result Date: 01/18/2016 CLINICAL DATA:  Colon cancer.  History of gallstones and hematuria. EXAM: CT ABDOMEN AND  PELVIS WITH CONTRAST TECHNIQUE: Multidetector CT imaging of the abdomen and pelvis was performed using the standard protocol following bolus administration of intravenous contrast. CONTRAST:  9m ISOVUE-300 IOPAMIDOL (ISOVUE-300) INJECTION 61% COMPARISON:  11/10/2014 FINDINGS: Lower chest: Small right greater than left pleural effusions. Calcified pleural plaque on the left posteriorly. Stable cardiomegaly with right atrial, coronary sinus and right ventricular leads noted. Chronic rounded atelectasis at the left lung base posteriorly. Bibasilar dependent and subsegmental atelectasis. Hepatobiliary: Gallbladder is contracted. No gallstones are seen. No space-occupying mass of liver. No ductal dilatation. Pancreas: No pancreatic ductal dilatation or focal mass. Spleen: Normal Adrenals/Urinary Tract: Normal adrenal glands. Right lower pole nonobstructing 8.5 mm renal stone. Stable left lower pole 3 cm exophytic renal cyst with post contrast Hounsfield unit of 29 unchanged from prior. Mural nodular density suggested on prior exam is not as apparent on this exam. This exophytic cyst again demonstrates peripheral thin mural calcifications. Stomach/Bowel: The  stomach is distended with contrast and fluid. There is no bowel obstruction or acute inflammatory process involving small nor large bowel. There is mild diffuse distention of small intestine approximately, nonspecific in etiology. Mild enteritis is not excluded. Circular muscle hypertrophy and sigmoid diverticulosis noted as before. Vascular/Lymphatic: Aortoiliac and branch vessel atherosclerosis without aneurysm or dissection. No lymphadenopathy. Reproductive: Radiation seeds noted of the normal size prostate. Other: No free air or free fluid. Musculoskeletal: Schmorl's node involving the superior endplate of L4 with degenerative disc disease L2-3 and L4-5. Multilevel osteophytes most prominent from T9 through T12. IMPRESSION: There are a few mildly distended fluid filled small bowel loops without evidence of acute inflammation or bowel obstruction. Mild ileus or enteritis could have this appearance of nonspecific. Sigmoid diverticulosis with circular muscle hypertrophy. Complex lower pole 3 cm exophytic cyst with thin peripheral calcifications suggesting a Bosniak category 2 renal cyst. Nonobstructing stable right lower pole 8.5 mm renal calculus. New small bilateral pleural effusions. Left-sided calcified pleural plaque consistent with prior asbestos exposure. Electronically Signed   By: DAshley RoyaltyM.D.   On: 01/18/2016 02:04      Assessment: Anemia with heme positive stool. Appropriate response to transfusion and no melena or hematochezia. I cannot find any verification that he has had colon cancer. From a note dated 2012 he has had colonoscopies up to 2010 with last showing no polyps.  Plan: Recommend conservative approach. Would treat with two-month course of PPI to cover acid peptic disease. Reassess if he drops his hemoglobin in the future associated with heme positive stools. We'll sign off for now.    Mykell Genao C 01/19/2016, 8:10 AM  Pager [phone] no answer or after 5 PM call  [phone]

## 2016-01-19 NOTE — Progress Notes (Signed)
PROGRESS NOTE  [name] QIW:[id number] DOB: 04/06/1932 DOA: 01/16/2016 PCP: [name], MD  Brief History:  82 y.o.gentleman with a history of systolic heart failure, CAD s/pCABG, COPD with chronic respiratory failure (on 2.5L [place] at baseline), atrial fibrillation, CKD 3, HTN, DM, PPM (former Bi-V ICD), lung andprostate cancers who presented 11/6 for dyspnea, orthopnea, leg swelling and 7 lbs weight gain. CXR showedcardiomegaly, small left pleural effusion, and interstitial fibrosis.BNP elevated to 1720. Troponin 0.03.EKG withv-paced rhythm. Hemoglobin was also noted to be acutely low, though he denies overt GI bleeding.Lasix '40mg'$  IV was given and patient was admitted for CHF exacerbation and symptomatic anemia work up.  Assessment/Plan: Acute exacerbation of chronic systolic heart failure: Last echo 2014 with EF 20-25%, mild LVH, and anteroseptal akinesis.  -Weight on admission 130lbs. Presumed EDW ~125 - Continue IV diuresis-->switch to po lasix 01/19/16 am -NEG 7 lbs, Neg 3.2 L  -now able to lay flat--however patient continues to have JVD -Restart IV Lasix -Suspect may need higher maintenance dose of Lasix when ready for discharge - 01/18/16 echocardiogram--EF 10-15%, diffuse HK, reduced RV function - Continue beta blocker, hold ACE inhibitor - add low dose spironolactone  Acute on chronic anemia:Normocytic, iron studies essentially normal with Tsat 19%. -baseline Hgb 9-10 +FOBT but denies overt GI bleeding. History of prostate and lung CA's,  - Hgb 8.5 > 7.7 overnight 11/6-11/7: Given significant CVD and dyspnea, -transfused one unit PRBC for the admission - restart xarelto and monitor Hgb - Repeat FOBT - Check CEA--4.2 - Appreciate Dr. Amedeo Plenty (EGD with [facility] in 2012 showed gastritis and hiatal hernia)-->start PPI, treat medically, follow Hgb -01/17/16--CT abd--few distended fluid filled SB loops w/o acute inflammation or bowel obstruction, new small bilateral  pleural effusion  CKD stage III: Baseline Cr ~1.1-1.3,  -serum creatinine 1.31 on admission.  - Hold ACE inhibitor - Monitor on diuretic therapy and s/p contrast for CT 11/7  Chronic respiratory failure with hypoxia -normally on 2.5 L at home -presently stable on 2.5 L  AFib s/p ACID:  -CHADS-Vasc score of at least 5, anticoagulated with xarelto. Also has history of symptomatic bradycardia.  -Digoxin discontinued at last admission due to CKD/age.  - Continue beta blocker - Telemetry  CAD s/p CABG 2004:  -Nuclear stress test in Aug. 17, 2017 showed large fixed defect without ischemia but no reversible ischemia per cardiology  - Continue BB, imdur - Holding ACE due to renal failure - ?not on a statin - restart lipitor, which was started at last admission (ACS r/o)  History of DM:  -HbA1c 5.4% in Aug 2017.  - Monitor CBG with BMPs - Carb modified, heart-healthy diet  History of falls: With recent right periorbital bruising.  - PT/OT-->HHPT for safety assessment    Disposition Plan:   Home in 1-2 days  Family Communication:  No Family at bedside  Consultants:  none  Code Status:  DNR  DVT Prophylaxis:  SCDs   Procedures: As Listed in Progress Note Above  Antibiotics: None     Subjective: Patient is breathing better. He states that his breathing is almost back to "normal". Denies any fevers, chest, chest pain, nausea, vomiting, diarrhea, abdominal pain. No dysuria.  Objective: Vitals:   01/18/16 2124 01/19/16 0537 01/19/16 0930 01/19/16 0932  BP: 115/63 (!) 103/50 (!) 115/55 106/65  Pulse: 68 64 61   Resp: 20 19    Temp: 98.1 F (36.7 C) 97.8 F (36.6 C)  TempSrc: Oral Oral    SpO2: 97% 100%    Weight:  56 kg (123 lb 8 oz)    Height:        Intake/Output Summary (Last 24 hours) at 01/19/16 1101 Last data filed at 01/19/16 0925  Gross per 24 hour  Intake              960 ml  Output             1625 ml  Net             -665 ml    Weight change: -2.268 kg (-5 lb) Exam:   General:  Pt is alert, follows commands appropriately, not in acute distress  HEENT: No icterus, No thrush, No neck mass, [place]/AT  Cardiovascular: RRR, S1/S2, no rubs, no gallops; positive JVD  Respiratory: Bibasilar crackles, right greater than left. No wheezing  Abdomen: Soft/+BS, non tender, non distended, no guarding  Extremities: No edema, No lymphangitis, No petechiae, No rashes, no synovitis   Data Reviewed: I have personally reviewed following labs and imaging studies Basic Metabolic Panel:  Recent Labs Lab 01/16/16 1425 01/17/16 0323 01/18/16 0334 01/19/16 0304  NA 133* 135 133* 133*  K 4.7 4.5 4.4 4.1  CL 97* 95* 92* 94*  CO2 '26 28 31 27  '$ GLUCOSE 106* 140* 92 88  BUN 21* 27* 40* 37*  CREATININE 1.31* 1.34* 1.59* 1.35*  CALCIUM 9.2 9.0 9.0 8.8*  MG  --   --   --  2.2   Liver Function Tests:  Recent Labs Lab 01/16/16 2203  AST 21  ALT 21  ALKPHOS 105  BILITOT 1.7*  PROT 7.5  ALBUMIN 3.4*   No results for input(s): LIPASE, AMYLASE in the last 168 hours. No results for input(s): AMMONIA in the last 168 hours. Coagulation Profile: No results for input(s): INR, PROTIME in the last 168 hours. CBC:  Recent Labs Lab 01/16/16 1425 01/17/16 0323 01/17/16 2220 01/18/16 0334 01/19/16 0304  WBC 3.5* 1.5* 3.6* 3.6* 3.3*  HGB 8.5* 7.7* 9.2* 9.9* 10.5*  HCT 26.7* 23.6* 27.9* 30.0* 31.9*  MCV 99.3 97.5 94.6 94.9 95.2  PLT 139* 130* 147* 146* 162   Cardiac Enzymes:  Recent Labs Lab 01/16/16 2203 01/17/16 0323 01/17/16 1107  TROPONINI <0.03 <0.03 <0.03   BNP: Invalid input(s): POCBNP CBG:  Recent Labs Lab 01/18/16 0554 01/18/16 1131 01/18/16 1636 01/18/16 2109 01/19/16 0658  GLUCAP 91 91 93 97 96   HbA1C: No results for input(s): HGBA1C in the last 72 hours. Urine analysis:    Component Value Date/Time   COLORURINE YELLOW 11/10/2014 1815   APPEARANCEUR CLEAR 11/10/2014 1815   LABSPEC  1.025 11/10/2014 1815   LABSPEC 1.015 05/03/2011 0950   PHURINE 7.0 11/10/2014 1815   GLUCOSEU NEGATIVE 11/10/2014 1815   HGBUR NEGATIVE 11/10/2014 1815   BILIRUBINUR NEGATIVE 11/10/2014 1815   BILIRUBINUR Color Interference 05/03/2011 0950   KETONESUR NEGATIVE 11/10/2014 1815   PROTEINUR 30 (A) 11/10/2014 1815   UROBILINOGEN 0.2 11/10/2014 1815   NITRITE NEGATIVE 11/10/2014 1815   LEUKOCYTESUR NEGATIVE 11/10/2014 1815   LEUKOCYTESUR Color Interference 05/03/2011 0950   Sepsis Labs: '@LABRCNTIP'$ (procalcitonin:4,lacticidven:4) ) Recent Results (from the past 240 hour(s))  MRSA PCR Screening     Status: None   Collection Time: 01/17/16  3:34 AM  Result Value Ref Range Status   MRSA by PCR NEGATIVE NEGATIVE Final    Comment:        The GeneXpert MRSA Assay (FDA  approved for NASAL specimens only), is one component of a comprehensive MRSA colonization surveillance program. It is not intended to diagnose MRSA infection nor to guide or monitor treatment for MRSA infections.      Scheduled Meds: . allopurinol  300 mg Oral Daily  . atorvastatin  40 mg Oral q1800  . benzonatate  100 mg Oral Q8H  . furosemide  40 mg Intravenous Daily  . isosorbide mononitrate  30 mg Oral Daily  . metoprolol succinate  25 mg Oral Daily  . sodium chloride flush  3 mL Intravenous Q12H  . spironolactone  12.5 mg Oral Daily   Continuous Infusions:  Procedures/Studies: Dg Chest 2 View  Result Date: 01/16/2016 CLINICAL DATA:  Central and left chest pain for the past 2 days. Ex-smoker. EXAM: CHEST  2 VIEW COMPARISON:  10/26/2015. FINDINGS: Mildly progressive enlargement of the cardiac silhouette. Stable post CABG changes and left subclavian AICD and pacemaker leads. A right subclavian pacemaker lead is also unchanged. Prominence of the interstitial markings and right mid lung zone scarring are unchanged. Small left pleural effusion without significant change. Thoracolumbar spine degenerative changes.  IMPRESSION: 1. Mildly progressive cardiomegaly. 2. Stable small left pleural effusion. 3. Stable interstitial fibrosis and right lung scarring. Electronically Signed   By: Claudie Revering M.D.   On: 01/16/2016 15:13   Ct Abdomen Pelvis W Contrast  Result Date: 01/18/2016 CLINICAL DATA:  Colon cancer.  History of gallstones and hematuria. EXAM: CT ABDOMEN AND PELVIS WITH CONTRAST TECHNIQUE: Multidetector CT imaging of the abdomen and pelvis was performed using the standard protocol following bolus administration of intravenous contrast. CONTRAST:  75m ISOVUE-300 IOPAMIDOL (ISOVUE-300) INJECTION 61% COMPARISON:  11/10/2014 FINDINGS: Lower chest: Small right greater than left pleural effusions. Calcified pleural plaque on the left posteriorly. Stable cardiomegaly with right atrial, coronary sinus and right ventricular leads noted. Chronic rounded atelectasis at the left lung base posteriorly. Bibasilar dependent and subsegmental atelectasis. Hepatobiliary: Gallbladder is contracted. No gallstones are seen. No space-occupying mass of liver. No ductal dilatation. Pancreas: No pancreatic ductal dilatation or focal mass. Spleen: Normal Adrenals/Urinary Tract: Normal adrenal glands. Right lower pole nonobstructing 8.5 mm renal stone. Stable left lower pole 3 cm exophytic renal cyst with post contrast Hounsfield unit of 29 unchanged from prior. Mural nodular density suggested on prior exam is not as apparent on this exam. This exophytic cyst again demonstrates peripheral thin mural calcifications. Stomach/Bowel: The stomach is distended with contrast and fluid. There is no bowel obstruction or acute inflammatory process involving small nor large bowel. There is mild diffuse distention of small intestine approximately, nonspecific in etiology. Mild enteritis is not excluded. Circular muscle hypertrophy and sigmoid diverticulosis noted as before. Vascular/Lymphatic: Aortoiliac and branch vessel atherosclerosis without  aneurysm or dissection. No lymphadenopathy. Reproductive: Radiation seeds noted of the normal size prostate. Other: No free air or free fluid. Musculoskeletal: Schmorl's node involving the superior endplate of L4 with degenerative disc disease L2-3 and L4-5. Multilevel osteophytes most prominent from T9 through T12. IMPRESSION: There are a few mildly distended fluid filled small bowel loops without evidence of acute inflammation or bowel obstruction. Mild ileus or enteritis could have this appearance of nonspecific. Sigmoid diverticulosis with circular muscle hypertrophy. Complex lower pole 3 cm exophytic cyst with thin peripheral calcifications suggesting a Bosniak category 2 renal cyst. Nonobstructing stable right lower pole 8.5 mm renal calculus. New small bilateral pleural effusions. Left-sided calcified pleural plaque consistent with prior asbestos exposure. Electronically Signed   By: DMeredith LeedsD.  On: 01/18/2016 02:04    Dimitrius Steedman, DO  [facility] Pager [phone]  If 7PM-7AM, please contact night-coverage [email or web address] Password [email or web address] 01/19/2016, 11:01 AM   LOS: 3 days

## 2016-01-19 NOTE — Consult Note (Signed)
Reason for Consult:   Ischemic Cradiomyopathy  Requesting Physician: Dr Tat Primary Cardiologist Dr Lovena Le  HPI:   [name] is an 82 y.o. male, lives in his own home-his daughter lives with him,  with history of CAD s/p CABG x 3 in 2004, ICM with an EF of 20-25% in 2015, CAF with SSS- s/p multiple pacemakers and finally a St Jude Biv ICD upgrade in 3244, chronic systolic CHF, DM, HTN, HLD, black lung disease,- with chronic respiratory failure on home O2, h/o CVA,h/o  prostate CA s/p seed implant 2012, GERD, and CKD stage III. He was admitted 01/16/16 with increased dyspnea and orthopnea. His wgt on admission was 130. He was also noted to be anemic, his Hgb dropped to 7.7. Conservative Rx recommended (no endoscopy or colonoscopy). He was transfused and his Hgb has improved. He has diuresed 3.6 L- 7 lbs. Echo today shows his EF to be 10-15%. Symptomatically he is improved from admission.   PMHx:  Past Medical History:  Diagnosis Date  . Arthritis   . Black lung disease ([place])   . CAD (coronary artery disease)    a. 07/2002 CABG x 3: LIMA->LAD, VG->Diag, VG->OM;  b. 06/2006 Cath: LM 50-60ost/p, LAD patent mid stent, D1 sev dzs, D2 patent stent, LCX nl, OM2 sev sten prox, RCA large/nl, VG->Diag nl, VG->OM nl, LIMA->LAD atretic, EF 30%.  . Carotid artery occlusion   . Chronic atrial fibrillation ([place])   . Chronic respiratory failure ([place])   . Chronic systolic CHF (congestive heart failure) ([place])    a. 12/2012 Echo: EF 20-25%, mid-dist antsept AK, mod dil LA.  [name] COPD (chronic obstructive pulmonary disease) ([place])    a. On home O2.  [name] CVA (cerebral vascular accident) ([place])   . Diabetes mellitus   . GERD (gastroesophageal reflux disease)   . Gout   . Hypercholesterolemia   . Hypertension   . Ischemic cardiomyopathy    a. 06/2006  BIV-ICD -> gen change 2015 to [place].  . Lung cancer ([place])   . Nephrolithiasis 09/2000  . On home oxygen therapy    "2.5L; 24/7"  (01/16/2016)  . Prostate cancer ([place]) 11/01/10   gleason 7, 8, 9, gold seeds 02/08/11  . Symptomatic bradycardia     Past Surgical History:  Procedure Laterality Date  . BI-VENTRICULAR PACEMAKER INSERTION (CRT-P)  12-02-13   downgrade of previously implanted CRTD to STJ CRTP  . BIV PACEMAKER GENERATOR CHANGE OUT N/A 12/02/2013   Procedure: BIV PACEMAKER GENERATOR CHANGE OUT;  Surgeon: Evans Lance, MD;  Location: [facility] C[facility] LAB;  Service: Cardiovascular;  Laterality: N/A;  . COLONOSCOPY    . CORONARY ANGIOPLASTY WITH STENT PLACEMENT  07/1997; 08/1997;03/1998  . CORONARY ARTERY BYPASS GRAFT  07/2002   CABG X3  . ESOPHAGOGASTRODUODENOSCOPY  02/19/2011   Procedure: ESOPHAGOGASTRODUODENOSCOPY (EGD);  Surgeon: Jeryl Columbia, MD;  Location: [facility] ENDOSCOPY;  Service: Endoscopy;  Laterality: N/A;  . INCISION AND DRAINAGE OF WOUND  08/2002   right thigh; S/P EVH  . INSERT / REPLACE / [place]; 1992; 01/2000;  . INSERT / REPLACE / REMOVE PACEMAKER  09/2003; 06/2006   w/AICD  . INSERT / REPLACE / REMOVE PACEMAKER  12/2004   pacmaker explant  . SHOULDER ARTHROSCOPY W/ ROTATOR CUFF REPAIR  05/2008   left  . upper endoscpopy      SOCHx:  reports that he quit smoking about 66 years ago. His smoking use included  Cigarettes. His smokeless tobacco use includes Chew. He reports that he does not drink alcohol or use drugs.  FAMHx: Family History  Problem Relation Age of Onset  . Alzheimer's disease Father 22  . Cancer Father 44    metastatic prostate cancer  . Diabetes Sister 42  . Diabetes Brother   . Diabetes Brother   . Diabetes Brother   . Hypotension Neg Hx   . Malignant hyperthermia Neg Hx   . Pseudochol deficiency Neg Hx     ALLERGIES: Allergies  Allergen Reactions  . Aspirin Hives  . Penicillins Rash    Rash Has patient had a PCN reaction causing immediate rash, facial/tongue/throat swelling, SOB or lightheadedness with hypotension:YES Has patient had a PCN reaction causing  severe rash involving mucus membranes or skin necrosis: NO Has patient had a PCN reaction that required hospitalization NO Has patient had a PCN reaction occurring within the last 10 years:NO If all of the above answers are "NO", then may proceed with Cephalosporin use.    ROS: Review of Systems: General: negative for chills, fever, night sweats or weight changes.  Cardiovascular: negative for chest pain, dyspnea on exertion, edema, orthopnea, palpitations, paroxysmal nocturnal dyspnea or shortness of breath HEENT: negative for any visual disturbances, blindness, glaucoma Dermatological: negative for rash Respiratory: negative for cough, hemoptysis, or wheezing Urologic: negative for hematuria or dysuria Abdominal: negative for nausea, vomiting, diarrhea, bright red blood per rectum, melena, or hematemesis Neurologic: negative for visual changes, syncope, or dizziness Musculoskeletal: negative for back pain, joint pain, or swelling Psych: cooperative and appropriate All other systems reviewed and are otherwise negative except as noted above.   HOME MEDICATIONS: Prior to Admission medications   Medication Sig Start Date End Date Taking? Authorizing Provider  acetaminophen (TYLENOL) 500 MG tablet Take 500 mg by mouth every 6 (six) hours as needed (pain).   Yes Historical Provider, MD  albuterol (PROVENTIL HFA;VENTOLIN HFA) 108 (90 Base) MCG/ACT inhaler Inhale 2 puffs into the lungs every 4 (four) hours as needed for wheezing or shortness of breath. 06/10/15  Yes Shawn C Joy, PA-C  albuterol (PROVENTIL) (2.5 MG/3ML) 0.083% nebulizer solution Take 2.5 mg by nebulization every 6 (six) hours as needed for wheezing or shortness of breath.   Yes Historical Provider, MD  allopurinol (ZYLOPRIM) 300 MG tablet Take 300 mg by mouth daily.     Yes Historical Provider, MD  benzonatate (TESSALON) 100 MG capsule Take 1 capsule (100 mg total) by mouth every 8 (eight) hours. 06/10/15  Yes Shawn C Joy, PA-C    CALCIUM-VITAMIN D PO Take 1 tablet by mouth 2 (two) times daily.    Yes Historical Provider, MD  furosemide (LASIX) 20 MG tablet Take 20 mg by mouth daily.   Yes Historical Provider, MD  guaifenesin (ROBITUSSIN) 100 MG/5ML syrup Take 200 mg by mouth 3 (three) times daily as needed for cough.   Yes Historical Provider, MD  hydrocortisone cream 1 % Apply to affected area 2 times daily Patient taking differently: Apply 1 application topically 2 (two) times daily as needed for itching. Apply to affected area 2 times daily 10/05/14  Yes Hollace Kinnier Sofia, PA-C  isosorbide mononitrate (IMDUR) 30 MG 24 hr tablet Take 30 mg by mouth daily. 01/11/16  Yes Historical Provider, MD  lisinopril (PRINIVIL,ZESTRIL) 10 MG tablet Take 10 mg by mouth daily.   Yes Historical Provider, MD  metoprolol succinate (TOPROL-XL) 25 MG 24 hr tablet Take 25 mg by mouth daily. 07/27/13  Yes Historical  Provider, MD  nitroGLYCERIN (NITROSTAT) 0.4 MG SL tablet Place 0.4 mg under the tongue every 5 (five) minutes as needed for chest pain.   Yes Historical Provider, MD  Omega-3 Fatty Acids (FISH OIL) 1000 MG CAPS Take 1,000 mg by mouth at bedtime.    Yes Historical Provider, MD  OXYGEN Inhale 2.5 L into the lungs continuous.   Yes Historical Provider, MD  Rivaroxaban (XARELTO) 15 MG TABS tablet Take 1 tablet (15 mg total) by mouth daily with supper. 10/27/15  Yes Reyne Dumas, MD  vitamin B-12 (CYANOCOBALAMIN) 1000 MCG tablet Take 1,000 mcg by mouth 2 (two) times daily.   Yes Historical Provider, MD    HOSPITAL MEDICATIONS: I have reviewed the patient's current medications.  VITALS: Blood pressure 110/61, pulse 63, temperature 97.8 F (36.6 C), temperature source Oral, resp. rate 16, height '5\' 5"'$  (1.651 m), weight 123 lb 8 oz (56 kg), SpO2 100 %.  PHYSICAL EXAM: General appearance: alert, cooperative, no distress and pale Neck: no JVD Lungs: decreased at bases Heart: regular rate and rhythm Abdomen: soft, non-tender; bowel sounds  normal; no masses,  no organomegaly Extremities: extremities normal, atraumatic, no cyanosis or edema Pulses: diminnished Skin: pale, cool, dry Neurologic: Grossly normal  LABS: Results for orders placed or performed during the hospital encounter of 01/16/16 (from the past 24 hour(s))  Glucose, capillary     Status: None   Collection Time: 01/18/16  9:09 PM  Result Value Ref Range   Glucose-Capillary 97 65 - 99 mg/dL   Comment 1 Notify RN    Comment 2 Document in Chart   Basic metabolic panel     Status: Abnormal   Collection Time: 01/19/16  3:04 AM  Result Value Ref Range   Sodium 133 (L) 135 - 145 mmol/L   Potassium 4.1 3.5 - 5.1 mmol/L   Chloride 94 (L) 101 - 111 mmol/L   CO2 27 22 - 32 mmol/L   Glucose, Bld 88 65 - 99 mg/dL   BUN 37 (H) 6 - 20 mg/dL   Creatinine, Ser 1.35 (H) 0.61 - 1.24 mg/dL   Calcium 8.8 (L) 8.9 - 10.3 mg/dL   GFR calc non Af Amer 48 (L) >60 mL/min   GFR calc Af Amer 55 (L) >60 mL/min   Anion gap 12 5 - 15  CBC     Status: Abnormal   Collection Time: 01/19/16  3:04 AM  Result Value Ref Range   WBC 3.3 (L) 4.0 - 10.5 K/uL   RBC 3.35 (L) 4.22 - 5.81 MIL/uL   Hemoglobin 10.5 (L) 13.0 - 17.0 g/dL   HCT 31.9 (L) 39.0 - 52.0 %   MCV 95.2 78.0 - 100.0 fL   MCH 31.3 26.0 - 34.0 pg   MCHC 32.9 30.0 - 36.0 g/dL   RDW 17.9 (H) 11.5 - 15.5 %   Platelets 162 150 - 400 K/uL  Brain natriuretic peptide     Status: Abnormal   Collection Time: 01/19/16  3:04 AM  Result Value Ref Range   B Natriuretic Peptide 680.1 (H) 0.0 - 100.0 pg/mL  Magnesium     Status: None   Collection Time: 01/19/16  3:04 AM  Result Value Ref Range   Magnesium 2.2 1.7 - 2.4 mg/dL  Glucose, capillary     Status: None   Collection Time: 01/19/16  6:58 AM  Result Value Ref Range   Glucose-Capillary 96 65 - 99 mg/dL   Comment 1 Notify RN    Comment 2  Document in Chart     EKG: paced  IMAGING: Ct Abdomen Pelvis W Contrast  Result Date: 01/18/2016 CLINICAL DATA:  Colon cancer.   History of gallstones and hematuria. EXAM: CT ABDOMEN AND PELVIS WITH CONTRAST TECHNIQUE: Multidetector CT imaging of the abdomen and pelvis was performed using the standard protocol following bolus administration of intravenous contrast. CONTRAST:  28m ISOVUE-300 IOPAMIDOL (ISOVUE-300) INJECTION 61% COMPARISON:  11/10/2014 FINDINGS: Lower chest: Small right greater than left pleural effusions. Calcified pleural plaque on the left posteriorly. Stable cardiomegaly with right atrial, coronary sinus and right ventricular leads noted. Chronic rounded atelectasis at the left lung base posteriorly. Bibasilar dependent and subsegmental atelectasis. Hepatobiliary: Gallbladder is contracted. No gallstones are seen. No space-occupying mass of liver. No ductal dilatation. Pancreas: No pancreatic ductal dilatation or focal mass. Spleen: Normal Adrenals/Urinary Tract: Normal adrenal glands. Right lower pole nonobstructing 8.5 mm renal stone. Stable left lower pole 3 cm exophytic renal cyst with post contrast Hounsfield unit of 29 unchanged from prior. Mural nodular density suggested on prior exam is not as apparent on this exam. This exophytic cyst again demonstrates peripheral thin mural calcifications. Stomach/Bowel: The stomach is distended with contrast and fluid. There is no bowel obstruction or acute inflammatory process involving small nor large bowel. There is mild diffuse distention of small intestine approximately, nonspecific in etiology. Mild enteritis is not excluded. Circular muscle hypertrophy and sigmoid diverticulosis noted as before. Vascular/Lymphatic: Aortoiliac and branch vessel atherosclerosis without aneurysm or dissection. No lymphadenopathy. Reproductive: Radiation seeds noted of the normal size prostate. Other: No free air or free fluid. Musculoskeletal: Schmorl's node involving the superior endplate of L4 with degenerative disc disease L2-3 and L4-5. Multilevel osteophytes most prominent from T9  through T12. IMPRESSION: There are a few mildly distended fluid filled small bowel loops without evidence of acute inflammation or bowel obstruction. Mild ileus or enteritis could have this appearance of nonspecific. Sigmoid diverticulosis with circular muscle hypertrophy. Complex lower pole 3 cm exophytic cyst with thin peripheral calcifications suggesting a Bosniak category 2 renal cyst. Nonobstructing stable right lower pole 8.5 mm renal calculus. New small bilateral pleural effusions. Left-sided calcified pleural plaque consistent with prior asbestos exposure. Electronically Signed   By: DAshley RoyaltyM.D.   On: 01/18/2016 02:04    IMPRESSION: Principal Problem:   Acute on chronic systolic CHF (congestive heart failure) (HCC) Active Problems:   Anemia   [place]rial [place]   S/P St Jude BiV ICD upgrade 2015   On home oxygen therapy   Chronic respiratory failure   Hx of CABG x 3 2004 after multiple PCI's in'90s   Chronic renal disease, stage III   Cardiomyopathy, ischemic   Chronic anticoagulation-Xarelto   RECOMMENDATION: Mr CBasherdid pretty well for a year or two after his BiV but he is now a non responder with his EF down to 10%. Currently he is compensated. We will arrange for an office f/u in a week. He is not on an ACE or ARB secondary to CRI and low B/P- doubt he could tolerate Entresto. Will check with Dr TLovena Leif anything else can be done from a pacemaker standpoint.   Time Spent Directly with Patient: 40 minutes  LKerin Ransom PCottonwood Shoresbeeper 01/19/2016, 4:39 PM   History and all data above reviewed.  Patient examined.  I agree with the findings as above.  He reports that his breathing is better but not at baseline.  He is in no distress.  He is down 3.5  liters since admission. Weight is down about 5 liters.  .  The patient exam reveals COR:RRR  ,  Lungs: Clear  ,  Abd: Positive bowel sounds, no rebound no guarding, Ext No edema  .  All available labs, radiology  testing, previous records reviewed. Agree with documented assessment and plan. Acute on chronic HF:  He seems to be better.  I think that we could start PO Lasix in the morning.  He needs to ambulate before discharge.  Doubt that we have much room to move on his meds.    Jeneen Rinks Mylei Brackeen  5:53 PM  01/19/2016

## 2016-01-19 NOTE — Progress Notes (Signed)
Patient ambulated with stand-by assistance with nursing student and tolerated well.

## 2016-01-20 ENCOUNTER — Inpatient Hospital Stay (HOSPITAL_COMMUNITY)
Admission: EM | Admit: 2016-01-20 | Discharge: 2016-01-26 | Disposition: A | Discharge status: Home / Self Care | Payer: Medicare Other | Source: Home / Self Care | Attending: Internal Medicine | Admitting: Internal Medicine

## 2016-01-20 ENCOUNTER — Inpatient Hospital Stay (HOSPITAL_COMMUNITY): Payer: Medicare Other | Admitting: Anesthesiology

## 2016-01-20 ENCOUNTER — Encounter (HOSPITAL_COMMUNITY): Payer: Self-pay | Admitting: Nurse Practitioner

## 2016-01-20 ENCOUNTER — Emergency Department (HOSPITAL_COMMUNITY): Payer: Medicare Other

## 2016-01-20 ENCOUNTER — Encounter (HOSPITAL_COMMUNITY)
Admission: EM | Disposition: A | Discharge status: Home / Self Care | Payer: Self-pay | Source: Home / Self Care | Attending: Internal Medicine

## 2016-01-20 DIAGNOSIS — I70229 Atherosclerosis of native arteries of extremities with rest pain, unspecified extremity: Secondary | ICD-10-CM | POA: Diagnosis present

## 2016-01-20 DIAGNOSIS — R2990 Unspecified symptoms and signs involving the nervous system: Secondary | ICD-10-CM

## 2016-01-20 DIAGNOSIS — D649 Anemia, unspecified: Secondary | ICD-10-CM | POA: Diagnosis not present

## 2016-01-20 DIAGNOSIS — I7090 Unspecified atherosclerosis: Secondary | ICD-10-CM

## 2016-01-20 DIAGNOSIS — K921 Melena: Secondary | ICD-10-CM | POA: Diagnosis not present

## 2016-01-20 DIAGNOSIS — E119 Type 2 diabetes mellitus without complications: Secondary | ICD-10-CM

## 2016-01-20 DIAGNOSIS — C61 Malignant neoplasm of prostate: Secondary | ICD-10-CM | POA: Diagnosis present

## 2016-01-20 DIAGNOSIS — I5023 Acute on chronic systolic (congestive) heart failure: Secondary | ICD-10-CM

## 2016-01-20 DIAGNOSIS — I749 Embolism and thrombosis of unspecified artery: Secondary | ICD-10-CM | POA: Diagnosis present

## 2016-01-20 DIAGNOSIS — I998 Other disorder of circulatory system: Secondary | ICD-10-CM | POA: Diagnosis present

## 2016-01-20 DIAGNOSIS — R55 Syncope and collapse: Secondary | ICD-10-CM | POA: Diagnosis not present

## 2016-01-20 DIAGNOSIS — Z7901 Long term (current) use of anticoagulants: Secondary | ICD-10-CM

## 2016-01-20 DIAGNOSIS — I5043 Acute on chronic combined systolic (congestive) and diastolic (congestive) heart failure: Secondary | ICD-10-CM | POA: Diagnosis present

## 2016-01-20 HISTORY — PX: FEMORAL-POPLITEAL BYPASS GRAFT: SHX937

## 2016-01-20 LAB — CBC
HCT: 32.6 % — ABNORMAL LOW (ref 39.0–52.0)
HCT: 32.4 % — ABNORMAL LOW (ref 39.0–52.0)
Hemoglobin: 10.6 g/dL — ABNORMAL LOW (ref 13.0–17.0)
Hemoglobin: 10.7 g/dL — ABNORMAL LOW (ref 13.0–17.0)
MCH: 31.5 pg (ref 26.0–34.0)
MCH: 31.7 pg (ref 26.0–34.0)
MCHC: 32.5 g/dL (ref 30.0–36.0)
MCHC: 33 g/dL (ref 30.0–36.0)
MCV: 96.7 fL (ref 78.0–100.0)
MCV: 95.9 fL (ref 78.0–100.0)
Platelets: 174 10*3/uL (ref 150–400)
Platelets: 188 10*3/uL (ref 150–400)
RBC: 3.37 MIL/uL — ABNORMAL LOW (ref 4.22–5.81)
RBC: 3.38 MIL/uL — ABNORMAL LOW (ref 4.22–5.81)
RDW: 17.7 % — ABNORMAL HIGH (ref 11.5–15.5)
RDW: 17.2 % — ABNORMAL HIGH (ref 11.5–15.5)
WBC: 3.2 10*3/uL — ABNORMAL LOW (ref 4.0–10.5)
WBC: 4.8 10*3/uL (ref 4.0–10.5)

## 2016-01-20 LAB — PROTIME-INR
INR: 1.07
PROTHROMBIN TIME: 13.9 s (ref 11.4–15.2)

## 2016-01-20 LAB — TYPE AND SCREEN
ABO/RH(D): B POS
ANTIBODY SCREEN: NEGATIVE

## 2016-01-20 LAB — BASIC METABOLIC PANEL
Anion gap: 10 (ref 5–15)
BUN: 41 mg/dL — AB (ref 6–20)
CHLORIDE: 94 mmol/L — AB (ref 101–111)
CO2: 30 mmol/L (ref 22–32)
CREATININE: 1.41 mg/dL — AB (ref 0.61–1.24)
Calcium: 8.8 mg/dL — ABNORMAL LOW (ref 8.9–10.3)
GFR calc Af Amer: 52 mL/min — ABNORMAL LOW (ref >60)
GFR calc non Af Amer: 45 mL/min — ABNORMAL LOW (ref >60)
GLUCOSE: 119 mg/dL — AB (ref 65–99)
Potassium: 4.1 mmol/L (ref 3.5–5.1)
Sodium: 134 mmol/L — ABNORMAL LOW (ref 135–145)

## 2016-01-20 LAB — COMPREHENSIVE METABOLIC PANEL
ALK PHOS: 112 U/L (ref 38–126)
ALT: 25 U/L (ref 17–63)
ANION GAP: 12 (ref 5–15)
AST: 33 U/L (ref 15–41)
Albumin: 3.6 g/dL (ref 3.5–5.0)
BILIRUBIN TOTAL: 1.5 mg/dL — AB (ref 0.3–1.2)
BUN: 41 mg/dL — ABNORMAL HIGH (ref 6–20)
CALCIUM: 8.7 mg/dL — AB (ref 8.9–10.3)
CO2: 24 mmol/L (ref 22–32)
Chloride: 95 mmol/L — ABNORMAL LOW (ref 101–111)
Creatinine, Ser: 1.35 mg/dL — ABNORMAL HIGH (ref 0.61–1.24)
GFR calc non Af Amer: 48 mL/min — ABNORMAL LOW (ref >60)
GFR, EST AFRICAN AMERICAN: 55 mL/min — AB (ref >60)
Glucose, Bld: 176 mg/dL — ABNORMAL HIGH (ref 65–99)
Potassium: 4.9 mmol/L (ref 3.5–5.1)
Sodium: 131 mmol/L — ABNORMAL LOW (ref 135–145)
TOTAL PROTEIN: 7.1 g/dL (ref 6.5–8.1)

## 2016-01-20 LAB — I-STAT CHEM 8, ED
BUN: 53 mg/dL — ABNORMAL HIGH (ref 6–20)
CALCIUM ION: 1 mmol/L — AB (ref 1.15–1.40)
CHLORIDE: 95 mmol/L — AB (ref 101–111)
Creatinine, Ser: 1.4 mg/dL — ABNORMAL HIGH (ref 0.61–1.24)
GLUCOSE: 178 mg/dL — AB (ref 65–99)
HCT: 34 % — ABNORMAL LOW (ref 39.0–52.0)
Hemoglobin: 11.6 g/dL — ABNORMAL LOW (ref 13.0–17.0)
Potassium: 4.8 mmol/L (ref 3.5–5.1)
Sodium: 132 mmol/L — ABNORMAL LOW (ref 135–145)
TCO2: 27 mmol/L (ref 0–100)

## 2016-01-20 LAB — I-STAT TROPONIN, ED: Troponin i, poc: 0.01 ng/mL (ref 0.00–0.08)

## 2016-01-20 LAB — DIFFERENTIAL
Basophils Absolute: 0 10*3/uL (ref 0.0–0.1)
Basophils Relative: 0 %
EOS PCT: 1 %
Eosinophils Absolute: 0.1 10*3/uL (ref 0.0–0.7)
LYMPHS ABS: 2.2 10*3/uL (ref 0.7–4.0)
LYMPHS PCT: 45 %
MONO ABS: 0.5 10*3/uL (ref 0.1–1.0)
Monocytes Relative: 11 %
Neutro Abs: 2.1 10*3/uL (ref 1.7–7.7)
Neutrophils Relative %: 43 %

## 2016-01-20 LAB — APTT: aPTT: 22 seconds — ABNORMAL LOW (ref 24–36)

## 2016-01-20 LAB — GLUCOSE, CAPILLARY: GLUCOSE-CAPILLARY: 147 mg/dL — AB (ref 65–99)

## 2016-01-20 LAB — CBG MONITORING, ED: Glucose-Capillary: 170 mg/dL — ABNORMAL HIGH (ref 65–99)

## 2016-01-20 SURGERY — BYPASS GRAFT FEMORAL-POPLITEAL ARTERY
Anesthesia: General | Laterality: Right

## 2016-01-20 MED ORDER — FENTANYL CITRATE (PF) 250 MCG/5ML IJ SOLN
INTRAMUSCULAR | Status: AC
  Filled 2016-01-20: qty 5

## 2016-01-20 MED ORDER — HEPARIN SODIUM (PORCINE) 1000 UNIT/ML IJ SOLN
INTRAMUSCULAR | Status: AC
  Filled 2016-01-20: qty 1

## 2016-01-20 MED ORDER — PROPOFOL 10 MG/ML IV BOLUS
INTRAVENOUS | Status: AC
  Filled 2016-01-20: qty 20

## 2016-01-20 MED ORDER — SODIUM CHLORIDE 0.9 % IV SOLN
INTRAVENOUS | Status: DC | PRN
  Administered 2016-01-20: 500 mL

## 2016-01-20 MED ORDER — SUCCINYLCHOLINE CHLORIDE 200 MG/10ML IV SOSY
PREFILLED_SYRINGE | INTRAVENOUS | Status: DC | PRN
  Administered 2016-01-20: 100 mg via INTRAVENOUS

## 2016-01-20 MED ORDER — MORPHINE SULFATE (PF) 4 MG/ML IV SOLN
4.0000 mg | Freq: Once | INTRAVENOUS | Status: AC
  Administered 2016-01-20: 4 mg via INTRAVENOUS
  Filled 2016-01-20: qty 1

## 2016-01-20 MED ORDER — ISOSORBIDE MONONITRATE ER 30 MG PO TB24
30.0000 mg | ORAL_TABLET | Freq: Every day | ORAL | Status: DC
  Administered 2016-01-21 – 2016-01-26 (×6): 30 mg via ORAL
  Filled 2016-01-20 (×6): qty 1

## 2016-01-20 MED ORDER — IODIXANOL 320 MG/ML IV SOLN
INTRAVENOUS | Status: DC | PRN
  Administered 2016-01-20: 15 mL via INTRA_ARTERIAL

## 2016-01-20 MED ORDER — LIDOCAINE 2% (20 MG/ML) 5 ML SYRINGE
INTRAMUSCULAR | Status: AC
  Filled 2016-01-20: qty 5

## 2016-01-20 MED ORDER — 0.9 % SODIUM CHLORIDE (POUR BTL) OPTIME
TOPICAL | Status: DC | PRN
  Administered 2016-01-20: 1000 mL

## 2016-01-20 MED ORDER — SORBITOL 70 % SOLN
30.0000 mL | Freq: Every day | Status: DC | PRN
  Filled 2016-01-20: qty 30

## 2016-01-20 MED ORDER — PHENYLEPHRINE HCL 10 MG/ML IJ SOLN
INTRAVENOUS | Status: DC | PRN
  Administered 2016-01-20: 50 ug/min via INTRAVENOUS

## 2016-01-20 MED ORDER — FENTANYL CITRATE (PF) 100 MCG/2ML IJ SOLN: 25.0000 ug | INTRAMUSCULAR | Status: DC | PRN

## 2016-01-20 MED ORDER — PROTAMINE SULFATE 10 MG/ML IV SOLN
INTRAVENOUS | Status: AC
  Filled 2016-01-20: qty 5

## 2016-01-20 MED ORDER — HEPARIN SODIUM (PORCINE) 1000 UNIT/ML IJ SOLN
INTRAMUSCULAR | Status: DC | PRN
  Administered 2016-01-20: 3000 [IU] via INTRAVENOUS

## 2016-01-20 MED ORDER — FENTANYL CITRATE (PF) 100 MCG/2ML IJ SOLN
INTRAMUSCULAR | Status: DC | PRN
  Administered 2016-01-20: 100 ug via INTRAVENOUS
  Administered 2016-01-20: 50 ug via INTRAVENOUS

## 2016-01-20 MED ORDER — HEPARIN (PORCINE) IN NACL 100-0.45 UNIT/ML-% IJ SOLN
750.0000 [IU]/h | INTRAMUSCULAR | Status: DC
  Administered 2016-01-20 – 2016-01-22 (×3): 750 [IU]/h via INTRAVENOUS
  Filled 2016-01-20 (×3): qty 250

## 2016-01-20 MED ORDER — ROCURONIUM BROMIDE 10 MG/ML (PF) SYRINGE
PREFILLED_SYRINGE | INTRAVENOUS | Status: AC
  Filled 2016-01-20: qty 10

## 2016-01-20 MED ORDER — LACTATED RINGERS IV SOLN
INTRAVENOUS | Status: DC
  Administered 2016-01-20: 20:00:00 via INTRAVENOUS

## 2016-01-20 MED ORDER — ONDANSETRON HCL 4 MG/2ML IJ SOLN
INTRAMUSCULAR | Status: AC
  Filled 2016-01-20: qty 4

## 2016-01-20 MED ORDER — PROPOFOL 10 MG/ML IV BOLUS
INTRAVENOUS | Status: DC | PRN
  Administered 2016-01-20: 100 mg via INTRAVENOUS

## 2016-01-20 MED ORDER — LIDOCAINE 2% (20 MG/ML) 5 ML SYRINGE
INTRAMUSCULAR | Status: DC | PRN
  Administered 2016-01-20: 60 mg via INTRAVENOUS

## 2016-01-20 MED ORDER — MORPHINE SULFATE (PF) 4 MG/ML IV SOLN: 4.0000 mg | Freq: Once | INTRAVENOUS | Status: DC

## 2016-01-20 MED ORDER — LACTATED RINGERS IV SOLN
INTRAVENOUS | Status: DC | PRN
  Administered 2016-01-20: 20:00:00 via INTRAVENOUS

## 2016-01-20 MED ORDER — SPIRONOLACTONE 25 MG PO TABS: 12.5000 mg | ORAL_TABLET | Freq: Every day | ORAL | 0 refills | Status: DC

## 2016-01-20 MED ORDER — NITROGLYCERIN 0.4 MG SL SUBL: 0.4000 mg | SUBLINGUAL_TABLET | SUBLINGUAL | Status: DC | PRN

## 2016-01-20 MED ORDER — FAMOTIDINE IN NACL 20-0.9 MG/50ML-% IV SOLN
20.0000 mg | Freq: Two times a day (BID) | INTRAVENOUS | Status: DC
  Administered 2016-01-21 (×2): 20 mg via INTRAVENOUS
  Filled 2016-01-20 (×2): qty 50

## 2016-01-20 MED ORDER — FUROSEMIDE 10 MG/ML IJ SOLN
20.0000 mg | Freq: Every day | INTRAMUSCULAR | Status: DC
  Administered 2016-01-21: 20 mg via INTRAVENOUS
  Filled 2016-01-20: qty 2

## 2016-01-20 MED ORDER — SUGAMMADEX SODIUM 200 MG/2ML IV SOLN
INTRAVENOUS | Status: DC | PRN
  Administered 2016-01-20: 125 mg via INTRAVENOUS

## 2016-01-20 MED ORDER — VANCOMYCIN HCL 1000 MG IV SOLR
INTRAVENOUS | Status: DC | PRN
  Administered 2016-01-20: 1000 mg via INTRAVENOUS

## 2016-01-20 MED ORDER — ONDANSETRON HCL 4 MG/2ML IJ SOLN: 4.0000 mg | Freq: Four times a day (QID) | INTRAMUSCULAR | Status: DC | PRN

## 2016-01-20 MED ORDER — ALBUTEROL SULFATE (2.5 MG/3ML) 0.083% IN NEBU: 2.5000 mg | INHALATION_SOLUTION | Freq: Four times a day (QID) | RESPIRATORY_TRACT | Status: DC | PRN

## 2016-01-20 MED ORDER — ALBUTEROL SULFATE (2.5 MG/3ML) 0.083% IN NEBU: 2.5000 mg | INHALATION_SOLUTION | RESPIRATORY_TRACT | Status: DC | PRN

## 2016-01-20 MED ORDER — PHENYLEPHRINE 40 MCG/ML (10ML) SYRINGE FOR IV PUSH (FOR BLOOD PRESSURE SUPPORT)
PREFILLED_SYRINGE | INTRAVENOUS | Status: DC | PRN
  Administered 2016-01-20: 120 ug via INTRAVENOUS

## 2016-01-20 MED ORDER — ARTIFICIAL TEARS OP OINT
TOPICAL_OINTMENT | OPHTHALMIC | Status: AC
  Filled 2016-01-20: qty 3.5

## 2016-01-20 MED ORDER — OXYCODONE HCL 5 MG PO TABS: 5.0000 mg | ORAL_TABLET | Freq: Once | ORAL | Status: DC | PRN

## 2016-01-20 MED ORDER — SUGAMMADEX SODIUM 200 MG/2ML IV SOLN
INTRAVENOUS | Status: AC
  Filled 2016-01-20: qty 4

## 2016-01-20 MED ORDER — IOPAMIDOL (ISOVUE-370) INJECTION 76%
INTRAVENOUS | Status: AC
  Filled 2016-01-20: qty 100

## 2016-01-20 MED ORDER — HEMOSTATIC AGENTS (NO CHARGE) OPTIME
TOPICAL | Status: DC | PRN
  Administered 2016-01-20: 1 via TOPICAL

## 2016-01-20 MED ORDER — HEPARIN SODIUM (PORCINE) 1000 UNIT/ML IJ SOLN
INTRAMUSCULAR | Status: AC
  Filled 2016-01-20: qty 2

## 2016-01-20 MED ORDER — ONDANSETRON HCL 4 MG/2ML IJ SOLN
INTRAMUSCULAR | Status: DC | PRN
  Administered 2016-01-20: 4 mg via INTRAVENOUS

## 2016-01-20 MED ORDER — ROCURONIUM BROMIDE 100 MG/10ML IV SOLN
INTRAVENOUS | Status: DC | PRN
  Administered 2016-01-20: 10 mg via INTRAVENOUS
  Administered 2016-01-20: 50 mg via INTRAVENOUS

## 2016-01-20 MED ORDER — OXYCODONE-ACETAMINOPHEN 5-325 MG PO TABS
2.0000 | ORAL_TABLET | Freq: Once | ORAL | Status: DC
Start: 2016-01-20 — End: 2016-01-20

## 2016-01-20 MED ORDER — HEPARIN BOLUS VIA INFUSION
2000.0000 [IU] | Freq: Once | INTRAVENOUS | Status: AC
  Administered 2016-01-20: 2000 [IU] via INTRAVENOUS
  Filled 2016-01-20: qty 2000

## 2016-01-20 MED ORDER — VANCOMYCIN HCL IN DEXTROSE 1-5 GM/200ML-% IV SOLN
INTRAVENOUS | Status: AC
  Filled 2016-01-20: qty 200

## 2016-01-20 MED ORDER — PHENYLEPHRINE 40 MCG/ML (10ML) SYRINGE FOR IV PUSH (FOR BLOOD PRESSURE SUPPORT)
PREFILLED_SYRINGE | INTRAVENOUS | Status: AC
  Filled 2016-01-20: qty 10

## 2016-01-20 MED ORDER — OMEPRAZOLE 40 MG PO CPDR: 40.0000 mg | DELAYED_RELEASE_CAPSULE | Freq: Two times a day (BID) | ORAL | 1 refills | Status: AC

## 2016-01-20 MED ORDER — OXYCODONE HCL 5 MG/5ML PO SOLN: 5.0000 mg | Freq: Once | ORAL | Status: DC | PRN

## 2016-01-20 MED ORDER — METOPROLOL SUCCINATE ER 25 MG PO TB24
25.0000 mg | ORAL_TABLET | Freq: Every day | ORAL | Status: DC
  Administered 2016-01-21 – 2016-01-26 (×6): 25 mg via ORAL
  Filled 2016-01-20 (×6): qty 1

## 2016-01-20 MED ORDER — ACETAMINOPHEN 500 MG PO TABS: 500.0000 mg | ORAL_TABLET | Freq: Four times a day (QID) | ORAL | Status: DC | PRN

## 2016-01-20 MED ORDER — MIDAZOLAM HCL 2 MG/2ML IJ SOLN
INTRAMUSCULAR | Status: AC
  Filled 2016-01-20: qty 2

## 2016-01-20 SURGICAL SUPPLY — 78 items
ADH SKN CLS APL DERMABOND .7 (GAUZE/BANDAGES/DRESSINGS) ×1
AGENT HMST SPONGE THK3/8 (HEMOSTASIS) ×1
BAG BANDED W/RUBBER/TAPE 36X54 (MISCELLANEOUS) ×2 IMPLANT
BAG EQP BAND 135X91 W/RBR TAPE (MISCELLANEOUS) ×1
BANDAGE ACE 4X5 VEL STRL LF (GAUZE/BANDAGES/DRESSINGS) IMPLANT
BANDAGE ESMARK 6X9 LF (GAUZE/BANDAGES/DRESSINGS) IMPLANT
BNDG CMPR 9X6 STRL LF SNTH (GAUZE/BANDAGES/DRESSINGS)
BNDG ESMARK 6X9 LF (GAUZE/BANDAGES/DRESSINGS)
CANISTER SUCTION 2500CC (MISCELLANEOUS) ×3 IMPLANT
CANNULA VESSEL 3MM 2 BLNT TIP (CANNULA) IMPLANT
CATH EMB 3FR 40CM (CATHETERS) ×2 IMPLANT
CATH EMB 3FR 80CM (CATHETERS) ×2 IMPLANT
CATH EMB 4FR 80CM (CATHETERS) ×2 IMPLANT
CLIP TI MEDIUM 24 (CLIP) ×3 IMPLANT
CLIP TI WIDE RED SMALL 24 (CLIP) ×3 IMPLANT
COVER DOME SNAP 22 D (MISCELLANEOUS) ×2 IMPLANT
CUFF TOURNIQUET SINGLE 24IN (TOURNIQUET CUFF) IMPLANT
CUFF TOURNIQUET SINGLE 34IN LL (TOURNIQUET CUFF) IMPLANT
CUFF TOURNIQUET SINGLE 44IN (TOURNIQUET CUFF) IMPLANT
DERMABOND ADVANCED (GAUZE/BANDAGES/DRESSINGS) ×2
DERMABOND ADVANCED .7 DNX12 (GAUZE/BANDAGES/DRESSINGS) ×1 IMPLANT
DRAIN CHANNEL 15F RND FF W/TCR (WOUND CARE) IMPLANT
DRAPE PROXIMA HALF (DRAPES) IMPLANT
DRAPE X-RAY CASS 24X20 (DRAPES) IMPLANT
DRSG COVADERM 4X10 (GAUZE/BANDAGES/DRESSINGS) IMPLANT
DRSG COVADERM 4X8 (GAUZE/BANDAGES/DRESSINGS) IMPLANT
ELECT REM PT RETURN 9FT ADLT (ELECTROSURGICAL) ×3
ELECTRODE REM PT RTRN 9FT ADLT (ELECTROSURGICAL) ×1 IMPLANT
EVACUATOR SILICONE 100CC (DRAIN) IMPLANT
GLOVE BIO SURGEON STRL SZ 6.5 (GLOVE) ×1 IMPLANT
GLOVE BIO SURGEON STRL SZ7 (GLOVE) ×2 IMPLANT
GLOVE BIO SURGEON STRL SZ7.5 (GLOVE) ×3 IMPLANT
GLOVE BIO SURGEONS STRL SZ 6.5 (GLOVE) ×1
GLOVE BIOGEL PI IND STRL 7.0 (GLOVE) IMPLANT
GLOVE BIOGEL PI IND STRL 7.5 (GLOVE) IMPLANT
GLOVE BIOGEL PI INDICATOR 7.0 (GLOVE) ×2
GLOVE BIOGEL PI INDICATOR 7.5 (GLOVE) ×2
GLOVE SS BIOGEL STRL SZ 7.5 (GLOVE) IMPLANT
GLOVE SUPERSENSE BIOGEL SZ 7.5 (GLOVE) ×2
GOWN STRL REUS W/ TWL LRG LVL3 (GOWN DISPOSABLE) ×2 IMPLANT
GOWN STRL REUS W/ TWL XL LVL3 (GOWN DISPOSABLE) ×1 IMPLANT
GOWN STRL REUS W/TWL LRG LVL3 (GOWN DISPOSABLE) ×6
GOWN STRL REUS W/TWL XL LVL3 (GOWN DISPOSABLE) ×3
HEMOSTAT SPONGE AVITENE ULTRA (HEMOSTASIS) ×2 IMPLANT
INSERT FOGARTY SM (MISCELLANEOUS) IMPLANT
KIT BASIN OR (CUSTOM PROCEDURE TRAY) ×3 IMPLANT
KIT ROOM TURNOVER OR (KITS) ×3 IMPLANT
MARKER GRAFT CORONARY BYPASS (MISCELLANEOUS) IMPLANT
NS IRRIG 1000ML POUR BTL (IV SOLUTION) ×6 IMPLANT
PACK PERIPHERAL VASCULAR (CUSTOM PROCEDURE TRAY) ×3 IMPLANT
PAD ARMBOARD 7.5X6 YLW CONV (MISCELLANEOUS) ×6 IMPLANT
PADDING CAST COTTON 6X4 STRL (CAST SUPPLIES) IMPLANT
SET COLLECT BLD 21X3/4 12 (NEEDLE) IMPLANT
SET COLLECT BLD 21X3/4 12 PB (MISCELLANEOUS) ×2 IMPLANT
STOPCOCK 4 WAY LG BORE MALE ST (IV SETS) ×2 IMPLANT
SUT ETHILON 3 0 PS 1 (SUTURE) IMPLANT
SUT GORETEX 6.0 TT13 (SUTURE) IMPLANT
SUT GORETEX 6.0 TT9 (SUTURE) IMPLANT
SUT MNCRL AB 4-0 PS2 18 (SUTURE) ×4 IMPLANT
SUT PROLENE 5 0 C 1 24 (SUTURE) ×1 IMPLANT
SUT PROLENE 6 0 BV (SUTURE) ×5 IMPLANT
SUT PROLENE 7 0 BV 1 (SUTURE) IMPLANT
SUT SILK 2 0 SH (SUTURE) ×1 IMPLANT
SUT SILK 3 0 (SUTURE)
SUT SILK 3-0 18XBRD TIE 12 (SUTURE) IMPLANT
SUT VIC AB 2-0 CT1 27 (SUTURE) ×3
SUT VIC AB 2-0 CT1 TAPERPNT 27 (SUTURE) ×2 IMPLANT
SUT VIC AB 3-0 SH 27 (SUTURE) ×6
SUT VIC AB 3-0 SH 27X BRD (SUTURE) ×2 IMPLANT
SYR 3ML LL SCALE MARK (SYRINGE) ×2 IMPLANT
SYR TB 1ML LUER SLIP (SYRINGE) ×2 IMPLANT
SYRINGE 10CC LL (SYRINGE) ×2 IMPLANT
TAPE UMBILICAL COTTON 1/8X30 (MISCELLANEOUS) IMPLANT
TRAY FOLEY CATH SILVER 14FR (SET/KITS/TRAYS/PACK) IMPLANT
TRAY FOLEY W/METER SILVER 16FR (SET/KITS/TRAYS/PACK) ×1 IMPLANT
TUBING EXTENTION W/L.L. (IV SETS) ×2 IMPLANT
UNDERPAD 30X30 (UNDERPADS AND DIAPERS) ×3 IMPLANT
WATER STERILE IRR 1000ML POUR (IV SOLUTION) ×3 IMPLANT

## 2016-01-20 NOTE — Progress Notes (Signed)
Patient Name: [name] Date of Encounter: 01/20/2016  Primary Cardiologist: Dr. Thayer Ohm Problem List     Principal Problem:   Acute on chronic systolic CHF (congestive heart failure) ([place]) Active Problems:   Atrial fibrillation ([place])   S/P St Jude BiV ICD upgrade 2015   On home oxygen therapy   Chronic respiratory failure   Hx of CABG x 3 2004 after multiple PCI's in'90s   Anemia   Chronic renal disease, stage III   Chronic anticoagulation-Xarelto   Cardiomyopathy, ischemic     Subjective   Dyspnea improved on supplemental oxygen. Hasn't ambulated yet.   Inpatient Medications    Scheduled Meds: . allopurinol  300 mg Oral Daily  . atorvastatin  40 mg Oral q1800  . benzonatate  100 mg Oral Q8H  . furosemide  40 mg Oral Daily  . isosorbide mononitrate  30 mg Oral Daily  . metoprolol succinate  25 mg Oral Daily  . sodium chloride flush  3 mL Intravenous Q12H  . spironolactone  12.5 mg Oral Daily   Continuous Infusions:  PRN Meds: acetaminophen **OR** acetaminophen, albuterol, nitroGLYCERIN, ondansetron **OR** ondansetron (ZOFRAN) IV   Vital Signs    Vitals:   01/19/16 0932 01/19/16 1100 01/19/16 1931 01/20/16 0425  BP: 106/65 110/61 110/62 106/62  Pulse:  63 64 65  Resp:  '16 17 18  '$ Temp:  97.8 F (36.6 C) 97.5 F (36.4 C) 98.4 F (36.9 C)  TempSrc:  Oral Oral Oral  SpO2:  100% 100% 100%  Weight:    124 lb 8 oz (56.5 kg)  Height:        Intake/Output Summary (Last 24 hours) at 01/20/16 1103 Last data filed at 01/20/16 0854  Gross per 24 hour  Intake             1420 ml  Output             1501 ml  Net              -81 ml   Filed Weights   01/18/16 0313 01/19/16 0537 01/20/16 0425  Weight: 128 lb 8 oz (58.3 kg) 123 lb 8 oz (56 kg) 124 lb 8 oz (56.5 kg)    Physical Exam   GEN: Well nourished, well developed, in no acute distress.  HEENT: Grossly normal.  Neck: Supple, no JVD, carotid bruits, or masses. Cardiac: RRR, no murmurs,  rubs, or gallops. No clubbing, cyanosis, edema.  Radials/DP/PT 2+ and equal bilaterally.  Respiratory:  Respirations regular and unlabored, clear to auscultation bilaterally. GI: Soft, nontender, nondistended, BS + x 4. MS: no deformity or atrophy. Skin: warm and dry, no rash. Neuro:  Strength and sensation are intact. Psych: AAOx3.  Normal affect.  Labs    CBC  Recent Labs  01/19/16 0304 01/20/16 0238  WBC 3.3* 3.2*  HGB 10.5* 10.6*  HCT 31.9* 32.6*  MCV 95.2 96.7  PLT 162 016   Basic Metabolic Panel  Recent Labs  01/19/16 0304 01/20/16 0238  NA 133* 134*  K 4.1 4.1  CL 94* 94*  CO2 27 30  GLUCOSE 88 119*  BUN 37* 41*  CREATININE 1.35* 1.41*  CALCIUM 8.8* 8.8*  MG 2.2  --    Liver Function Tests No results for input(s): AST, ALT, ALKPHOS, BILITOT, PROT, ALBUMIN in the last 72 hours. No results for input(s): LIPASE, AMYLASE in the last 72 hours. Cardiac Enzymes  Recent Labs  01/17/16 1107  TROPONINI <0.03  BNP Invalid input(s): POCBNP D-Dimer No results for input(s): DDIMER in the last 72 hours. Hemoglobin A1C No results for input(s): HGBA1C in the last 72 hours. Fasting Lipid Panel No results for input(s): CHOL, HDL, LDLCALC, TRIG, CHOLHDL, LDLDIRECT in the last 72 hours. Thyroid Function Tests No results for input(s): TSH, T4TOTAL, T3FREE, THYROIDAB in the last 72 hours.  Invalid input(s): FREET3  Telemetry    V paced - Personally Reviewed  ECG    N/A  Radiology    No results found.  Cardiac Studies   Echo 01/18/16  LV EF: 10% -   15%  ------------------------------------------------------------------- Indications:      CHF - 428.0.  ------------------------------------------------------------------- History:   PMH:  Symptomatic bradycardia. On home oxygen therapy. Lung cancer. Prostate cancer.  Atrial fibrillation.  Coronary artery disease.  Chronic obstructive pulmonary disease.  Risk factors:  Hypertension. Diabetes  mellitus. Dyslipidemia.  ------------------------------------------------------------------- Study Conclusions  - Left ventricle: The cavity size was moderately dilated. Systolic   function was severely reduced. The estimated ejection fraction   was in the range of 10% to 15%. Diffuse hypokinesis. - Aortic valve: A bicuspid morphology cannot be excluded;   moderately thickened, moderately calcified leaflets. Valve   mobility was restricted. There was mild stenosis. There was no   regurgitation. Peak velocity (S): 220 cm/s. Valve area (VTI): 0.7   cm^2. Valve area (Vmax): 0.79 cm^2. Valve area (Vmean): 0.66   cm^2. - Mitral valve: Transvalvular velocity was within the normal range.   There was no evidence for stenosis. There was mild regurgitation. - Left atrium: The atrium was massively dilated. - Right ventricle: The cavity size was mildly dilated. Wall   thickness was normal. Systolic function was severely reduced. - Right atrium: The atrium was severely dilated. - Tricuspid valve: There was mild regurgitation. - Pulmonary arteries: Systolic pressure was within the normal   range. PA peak pressure: 18 mm Hg (S).  Impressions:  - Aortic stenosis is mild by mean gradient and peak velocity.   However, this is likely underestimated due to reduced systolic   function which can produce low flow, low gradient aortic   stenosis. Consider dobutamine stress echo if clinically   indicated.  Patient Profile     [name] an 82 y.o.male, lives in his own home-his daughter lives with him, with history of CAD s/p CABG x 3 in 2004, ICM with an EF of 20-25% in 2015, CAF with SSS- s/p multiple pacemakers and finally a St Jude Biv ICD upgrade in 9518, chronic systolic CHF, DM, HTN, HLD, chronic afib on Xarelto black lung disease,- with chronic respiratory failure on home O2, h/o CVA,h/o prostate CA s/p seed implant 2012, GERD, and CKD stage III who was admitted 01/16/16 with increased  dyspnea and orthopnea. Found to be anemic at hgb of 7.7 that improved after transfusion. Cardiology asked to evaluate due to Ef of 10%-15%  Assessment & Plan    1. Acute on chronic systolic HF - diuresed negative 3.3 L since admission however he was + 184cc yesterday. Total weight down 6lb and gained 1lb yesterday. Currently on lasix po '40mg'$  qd.  -  He is not on an ACE or ARB secondary to CRI and low B/P. He hasn't walked yet.   2. ICM - EF down to 10-15% this admission. Not candidate for invasive procedure in setting of acute anemia. Continue statin, BB an Imdur.  3. SSS s/p BiV   4. Chronic afib - Held xarelto by primary  due to acute anemia. Resume when ok.   Signed, Leanor Kail, PA  01/20/2016, 11:03 AM   History and all data above reviewed.  Patient examined.  I agree with the findings as above.  He says that he feels back to baseline.  Waiting to ambulate The patient exam reveals COR:RRR  ,  Lungs: Clear  ,  Abd: Positive bowel sounds, no rebound no guarding, Ext No edema  .  All available labs, radiology testing, previous records reviewed. Agree with documented assessment and plan.  Acute on chronic systolic HF:  I don't think that there is a need to keep him further.  I would send him home on Lasix 20 po which he was taking.  For now I agree with holding the ACE but he should have follow up labs in the office and restart at a low dose provided there is no symptomatic hypotension or rise in creat.  We will arrange TOC follow up.  Primary team needs to make sure that he is not actively leaving blood and restart  Xarelto with appropriate dose adjustment.  [name]  1:46 PM  01/20/2016

## 2016-01-20 NOTE — Anesthesia Procedure Notes (Signed)
Procedure Name: Intubation Date/Time: 01/20/2016 8:16 PM Performed by: Trixie Deis A Pre-anesthesia Checklist: Patient identified, Emergency Drugs available, Suction available and Patient being monitored Patient Re-evaluated:Patient Re-evaluated prior to inductionOxygen Delivery Method: Circle System Utilized Preoxygenation: Pre-oxygenation with 100% oxygen Intubation Type: IV induction, Rapid sequence and Cricoid Pressure applied Ventilation: Mask ventilation without difficulty Laryngoscope Size: Mac and 4 Grade View: Grade I Tube type: Oral Tube size: 7.5 mm Number of attempts: 1 Airway Equipment and Method: Stylet and Oral airway Placement Confirmation: ETT inserted through vocal cords under direct vision,  positive ETCO2 and breath sounds checked- equal and bilateral Tube secured with: Tape Dental Injury: Teeth and Oropharynx as per pre-operative assessment

## 2016-01-20 NOTE — Anesthesia Postprocedure Evaluation (Signed)
Anesthesia Post Note  Patient: Mickal E Madero  Procedure(s) Performed: Procedure(s) (LRB): THROMBECTOMY OF POPLITEAL ARTERY AND ANTERIOR TIBIAL ARTERY RIGHT LEG; INTRAOPERATIVE ARTERIOGRAM (Right)  Patient location during evaluation: PACU Anesthesia Type: General Level of consciousness: awake and alert and patient cooperative Pain management: pain level controlled Vital Signs Assessment: post-procedure vital signs reviewed and stable Respiratory status: spontaneous breathing and respiratory function stable Cardiovascular status: stable Anesthetic complications: no    Last Vitals:  Vitals:   01/20/16 2245 01/20/16 2315  BP:    Pulse:    Resp:    Temp: 36.4 C 36.3 C    Last Pain:  Vitals:   01/20/16 2245  TempSrc:   PainSc: 0-No pain                 Adham Johnson S

## 2016-01-20 NOTE — Progress Notes (Signed)
Reviewed all discharge instructions with patient and his daughter and both stated understanding.  Patient has personal belongings of clothes. No voiced complaints.

## 2016-01-20 NOTE — ED Notes (Addendum)
Per Dr Darl Householder, neurology would like to call code stroke. Code stroke activated. Dr Darl Householder and dr Leonel Ramsay in triage with pt

## 2016-01-20 NOTE — ED Notes (Signed)
Swallow screen not preformed due to patient being npo for possible surgery

## 2016-01-20 NOTE — Progress Notes (Signed)
ANTICOAGULATION CONSULT NOTE - Initial Consult  Pharmacy Consult:  Heparin Indication:   Possible right femoral arterial occlusion  Allergies  Allergen Reactions  . Aspirin Hives  . Penicillins Rash    Rash Has patient had a PCN reaction causing immediate rash, facial/tongue/throat swelling, SOB or lightheadedness with hypotension:YES Has patient had a PCN reaction causing severe rash involving mucus membranes or skin necrosis: NO Has patient had a PCN reaction that required hospitalization NO Has patient had a PCN reaction occurring within the last 10 years:NO If all of the above answers are "NO", then may proceed with Cephalosporin use.    Patient Measurements:   Heparin Dosing Weight: 57 kg  Vital Signs: Temp: 97.2 F (36.2 C) (11/10 1713) Temp Source: Axillary (11/10 1713) BP: 164/77 (11/10 1738) Pulse Rate: 65 (11/10 1738)  Labs:  Recent Labs  01/19/16 0304 01/20/16 0238 01/20/16 1735 01/20/16 1741  HGB 10.5* 10.6* 10.7* 11.6*  HCT 31.9* 32.6* 32.4* 34.0*  PLT 162 174 188  --   APTT  --   --  22*  --   LABPROT  --   --  13.9  --   INR  --   --  1.07  --   CREATININE 1.35* 1.41*  --  1.40*    Estimated Creatinine Clearance: 33.1 mL/min (by C-G formula based on SCr of 1.4 mg/dL (H)).   Medical History: Past Medical History:  Diagnosis Date  . Arthritis   . Black lung disease ([place])   . CAD (coronary artery disease)    a. 07/2002 CABG x 3: LIMA->LAD, VG->Diag, VG->OM;  b. 06/2006 Cath: LM 50-60ost/p, LAD patent mid stent, D1 sev dzs, D2 patent stent, LCX nl, OM2 sev sten prox, RCA large/nl, VG->Diag nl, VG->OM nl, LIMA->LAD atretic, EF 30%.  . Carotid artery occlusion   . Chronic atrial fibrillation ([place])   . Chronic respiratory failure ([place])   . Chronic systolic CHF (congestive heart failure) ([place])    a. 12/2012 Echo: EF 20-25%, mid-dist antsept AK, mod dil LA.  [name] COPD (chronic obstructive pulmonary disease) ([place])    a. On home O2.  [name] CVA (cerebral  vascular accident) ([place])   . Diabetes mellitus   . GERD (gastroesophageal reflux disease)   . Gout   . Hypercholesterolemia   . Hypertension   . Ischemic cardiomyopathy    a. 06/2006  BIV-ICD -> gen change 2015 to [place].  . Lung cancer ([place])   . Nephrolithiasis 09/2000  . On home oxygen therapy    "2.5L; 24/7" (01/16/2016)  . Prostate cancer ([place]) 11/01/10   gleason 7, 8, 9, gold seeds 02/08/11  . Symptomatic bradycardia        Assessment: 83 YOM just discharged from the hospital this AM.  He returned with complaint of leg pain.  Pharmacy consulted to initiate IV heparin for possible arterial occlusion of right femoral.    Noted he was on Xarelto for history of AFib and his last dose was on 01/16/16 (Xarelto was on hold and discharge summary instructed patient to resume).  Labs reviewed.    Goal of Therapy:  Heparin level 0.3-0.7 units/ml aPTT 66 - 102 seconds Monitor platelets by anticoagulation protocol: Yes    Plan:  - Heparin 2000 units IV bolus x 1, then - Heparin gtt at 750 units/hr - Check 8 hr heparin level and aPTT - Daily heparin level and CBC   Nishtha Raider D. Mina Marble, PharmD, BCPS Pager:  [phone] 01/20/2016, 6:10 PM

## 2016-01-20 NOTE — ED Provider Notes (Signed)
[facility] DEPT Provider Note   CSN: [id number] Arrival date & time: 01/20/16  1700   An emergency department physician performed an initial assessment on this suspected stroke patient at 1719.  History   Chief Complaint Chief Complaint  Patient presents with  . Code Stroke    HPI [name] is a 82 y.o. male hx of CAD, A. fib recently taking off the router due to GI bleed, COPD, CHF here presenting with acute onset of right leg pain. Patient just got discharged from the hospital earlier today for CHF exacerbation. The daughter was driving him home and will stop by a pharmacy to pick up his prescriptions. States that around 4 PM, patient has acute onset of right leg pain and numbness. He denies falls or injury to his back and denies any trouble speaking or arm weakness or numbness. The daughter drove right back to the hospital and he came to the ER for evaluation. Upon review of hospital chart, patient was taken off his Xarelto given recent drop of his hemoglobin and was concern for a slow GI bleed.   The history is provided by the patient.    Past Medical History:  Diagnosis Date  . Arthritis   . Black lung disease ([place])   . CAD (coronary artery disease)    a. 07/2002 CABG x 3: LIMA->LAD, VG->Diag, VG->OM;  b. 06/2006 Cath: LM 50-60ost/p, LAD patent mid stent, D1 sev dzs, D2 patent stent, LCX nl, OM2 sev sten prox, RCA large/nl, VG->Diag nl, VG->OM nl, LIMA->LAD atretic, EF 30%.  . Carotid artery occlusion   . Chronic atrial fibrillation ([place])   . Chronic respiratory failure ([place])   . Chronic systolic CHF (congestive heart failure) ([place])    a. 12/2012 Echo: EF 20-25%, mid-dist antsept AK, mod dil LA.  [name] COPD (chronic obstructive pulmonary disease) ([place])    a. On home O2.  [name] CVA (cerebral vascular accident) ([place])   . Diabetes mellitus   . GERD (gastroesophageal reflux disease)   . Gout   . Hypercholesterolemia   . Hypertension   . Ischemic cardiomyopathy    a. 06/2006   BIV-ICD -> gen change 2015 to [place].  . Lung cancer ([place])   . Nephrolithiasis 09/2000  . On home oxygen therapy    "2.5L; 24/7" (01/16/2016)  . Prostate cancer ([place]) 11/01/10   gleason 7, 8, 9, gold seeds 02/08/11  . Symptomatic bradycardia     Patient Active Problem List   Diagnosis Date Noted  . Chronic renal disease, stage III 01/19/2016  . Chronic anticoagulation-Xarelto 01/19/2016  . Cardiomyopathy, ischemic 01/19/2016  . Acute on chronic systolic CHF (congestive heart failure) ([place]) 01/18/2016  . Anemia 01/16/2016  . Black lung disease ([place]) 10/27/2015  . Pain in the chest   . Hx of CABG x 3 2004 after multiple PCI's in'90s   . Chronic respiratory failure 08/08/2013  . CAP (community acquired pneumonia) 08/07/2013  . Pneumonia 08/07/2013  . Occlusion and stenosis of carotid artery without mention of cerebral infarction 05/26/2013  . Syncope 02/15/2013  . Weakness generalized 05/14/2011  . On home oxygen therapy   . Prostate cancer ([place])   . Pacemaker   . Hematochezia 02/19/2011  . Melena 02/19/2011  . Prostate cancer, recur risk not determined whether low, med or high ([place])   . Systolic CHF, chronic ([place]) 02/16/2011  . SBO (small bowel obstruction) 02/15/2011  . ARF (acute renal failure) ([place]) 02/15/2011  . UTI (lower urinary tract infection) 02/15/2011  .  Syncope and collapse 02/10/2011  . S/P [employer] Jude BiV ICD upgrade 2015 05/24/2009  . HEART BLOCK 06/01/2008  . Atrial fibrillation ([place]) 06/01/2008    Past Surgical History:  Procedure Laterality Date  . BI-VENTRICULAR PACEMAKER INSERTION (CRT-P)  12-02-13   downgrade of previously implanted CRTD to STJ CRTP  . BIV PACEMAKER GENERATOR CHANGE OUT N/A 12/02/2013   Procedure: BIV PACEMAKER GENERATOR CHANGE OUT;  Surgeon: Evans Lance, MD;  Location: [facility] CATH LAB;  Service: Cardiovascular;  Laterality: N/A;  . COLONOSCOPY    . CORONARY ANGIOPLASTY WITH STENT PLACEMENT  07/1997; 08/1997;03/1998  . CORONARY ARTERY  BYPASS GRAFT  07/2002   CABG X3  . ESOPHAGOGASTRODUODENOSCOPY  02/19/2011   Procedure: ESOPHAGOGASTRODUODENOSCOPY (EGD);  Surgeon: Jeryl Columbia, MD;  Location[facility] ENDOSCOPY;  Service: Endoscopy;  Laterality: N/A;  . INCISION AND DRAINAGE OF WOUND  08/2002   right thigh; S/P EVH  . INSERT / REPLACE / [place]; 1992; 01/2000;  . INSERT / REPLACE / REMOVE PACEMAKER  09/2003; 06/2006   w/AICD  . INSERT / REPLACE / REMOVE PACEMAKER  12/2004   pacmaker explant  . SHOULDER ARTHROSCOPY W/ ROTATOR CUFF REPAIR  05/2008   left  . upper endoscpopy         Home Medications    Prior to Admission medications   Medication Sig Start Date End Date Taking? Authorizing Provider  acetaminophen (TYLENOL) 500 MG tablet Take 500 mg by mouth every 6 (six) hours as needed (pain).    Historical Provider, MD  albuterol (PROVENTIL HFA;VENTOLIN HFA) 108 (90 Base) MCG/ACT inhaler Inhale 2 puffs into the lungs every 4 (four) hours as needed for wheezing or shortness of breath. 06/10/15   Shawn C Joy, PA-C  albuterol (PROVENTIL) (2.5 MG/3ML) 0.083% nebulizer solution Take 2.5 mg by nebulization every 6 (six) hours as needed for wheezing or shortness of breath.    Historical Provider, MD  allopurinol (ZYLOPRIM) 300 MG tablet Take 300 mg by mouth daily.      Historical Provider, MD  benzonatate (TESSALON) 100 MG capsule Take 1 capsule (100 mg total) by mouth every 8 (eight) hours. 06/10/15   Shawn C Joy, PA-C  CALCIUM-VITAMIN D PO Take 1 tablet by mouth 2 (two) times daily.     Historical Provider, MD  furosemide (LASIX) 20 MG tablet Take 20 mg by mouth daily.    Historical Provider, MD  guaifenesin (ROBITUSSIN) 100 MG/5ML syrup Take 200 mg by mouth 3 (three) times daily as needed for cough.    Historical Provider, MD  hydrocortisone cream 1 % Apply to affected area 2 times daily Patient taking differently: Apply 1 application topically 2 (two) times daily as needed for itching. Apply to affected area 2 times  daily 10/05/14   Fransico Meadow, PA-C  isosorbide mononitrate (IMDUR) 30 MG 24 hr tablet Take 30 mg by mouth daily. 01/11/16   Historical Provider, MD  metoprolol succinate (TOPROL-XL) 25 MG 24 hr tablet Take 25 mg by mouth daily. 07/27/13   Historical Provider, MD  nitroGLYCERIN (NITROSTAT) 0.4 MG SL tablet Place 0.4 mg under the tongue every 5 (five) minutes as needed for chest pain.    Historical Provider, MD  Omega-3 Fatty Acids (FISH OIL) 1000 MG CAPS Take 1,000 mg by mouth at bedtime.     Historical Provider, MD  omeprazole (PRILOSEC) 40 MG capsule Take 1 capsule (40 mg total) by mouth 2 (two) times daily. 01/20/16   Orson Eva, MD  OXYGEN  Inhale 2.5 L into the lungs continuous.    Historical Provider, MD  Rivaroxaban (XARELTO) 15 MG TABS tablet Take 1 tablet (15 mg total) by mouth daily with supper. 10/27/15   Reyne Dumas, MD  spironolactone (ALDACTONE) 25 MG tablet Take 0.5 tablets (12.5 mg total) by mouth daily. 01/21/16   Orson Eva, MD  vitamin B-12 (CYANOCOBALAMIN) 1000 MCG tablet Take 1,000 mcg by mouth 2 (two) times daily.    Historical Provider, MD    Family History Family History  Problem Relation Age of Onset  . Alzheimer's disease Father 19  . Cancer Father 36    metastatic prostate cancer  . Diabetes Sister 70  . Diabetes Brother   . Diabetes Brother   . Diabetes Brother   . Hypotension Neg Hx   . Malignant hyperthermia Neg Hx   . Pseudochol deficiency Neg Hx     Social History Social History  Substance Use Topics  . Smoking status: Former Smoker    Types: Cigarettes    Quit date: 03/12/1949  . Smokeless tobacco: Current User    Types: Chew  . Alcohol use No     Comment: drank until per pt 03/19/11, h/o heavy use     Allergies   Aspirin and Penicillins   Review of Systems Review of Systems  Musculoskeletal:       R leg pain, numbness   All other systems reviewed and are negative.    Physical Exam Updated Vital Signs BP 164/77 (BP Location: Right Arm)    Pulse 65   Temp 97.2 F (36.2 C) (Axillary)   Resp 15   SpO2 98%   Physical Exam  Constitutional: He is oriented to person, place, and time.  Ill appearing   HENT:  Head: Normocephalic.  Eyes: EOM are normal. Pupils are equal, round, and reactive to light.  Neck: Normal range of motion. Neck supple.  Cardiovascular: Normal rate, regular rhythm and normal heart sounds.   Pulmonary/Chest: Effort normal and breath sounds normal. No respiratory distress. He has no wheezes. He has no rales.  Abdominal: Soft. Bowel sounds are normal. He exhibits no distension. There is no tenderness. There is no guarding.  Musculoskeletal:  R leg cold, unable to get dopplerable DP, PT, popliteal pulse. R foot cold.   Neurological: He is alert and oriented to person, place, and time.  CN 2-12 intact. Strength 2/5 R hip flexion, leg extension   Skin: Skin is warm.  Nursing note and vitals reviewed.    ED Treatments / Results  Labs (all labs ordered are listed, but only abnormal results are displayed) Labs Reviewed  APTT - Abnormal; Notable for the following:       Result Value   aPTT 22 (*)    All other components within normal limits  CBC - Abnormal; Notable for the following:    RBC 3.38 (*)    Hemoglobin 10.7 (*)    HCT 32.4 (*)    RDW 17.2 (*)    All other components within normal limits  I-STAT CHEM 8, ED - Abnormal; Notable for the following:    Sodium 132 (*)    Chloride 95 (*)    BUN 53 (*)    Creatinine, Ser 1.40 (*)    Glucose, Bld 178 (*)    Calcium, Ion 1.00 (*)    Hemoglobin 11.6 (*)    HCT 34.0 (*)    All other components within normal limits  CBG MONITORING, ED - Abnormal; Notable for the  following:    Glucose-Capillary 170 (*)    All other components within normal limits  PROTIME-INR  DIFFERENTIAL  ETHANOL  COMPREHENSIVE METABOLIC PANEL  RAPID URINE DRUG SCREEN, HOSP PERFORMED  URINALYSIS, ROUTINE W REFLEX MICROSCOPIC (NOT AT [facility])  I-STAT TROPOININ, ED    EKG   EKG Interpretation None       Radiology Ct Head Code Stroke Wo Contrast`  Addendum Date: 01/20/2016   ADDENDUM REPORT: 01/20/2016 17:39 ADDENDUM: These results were called by telephone at the time of interpretation on 01/20/2016 at 5:37 pm to Dr. Leonel Ramsay, who verbally acknowledged these results. Electronically Signed   By: Nelson Chimes M.D.   On: 01/20/2016 17:39   Result Date: 01/20/2016 CLINICAL DATA:  Code stroke.  Code stroke.  Right leg numbness. EXAM: CT HEAD WITHOUT CONTRAST TECHNIQUE: Contiguous axial images were obtained from the base of the skull through the vertex without intravenous contrast. COMPARISON:  06/04/2013.  02/15/2013. FINDINGS: Brain: Generalized brain atrophy. Old small vessel infarction inferior cerebellum on the right. Chronic small-vessel ischemic change of the hemispheric white matter. No evidence of acute infarction, mass lesion, hemorrhage, hydrocephalus or extra-axial collection. Vascular: There is atherosclerotic calcification of the major vessels at the base of the brain. Skull: Negative Sinuses/Orbits: Clear/normal Other: None significant ASPECTS ([place] Stroke Program Early CT Score) - Ganglionic level infarction (caudate, lentiform nuclei, internal capsule, insula, M1-M3 cortex): 7 - Supraganglionic infarction (M4-M6 cortex): 3 Total score (0-10 with 10 being normal): 10 IMPRESSION: 1. Atrophy. No acute finding. Old small vessel infarction inferior cerebellum on the right. 2. ASPECTS is 10 Call report in process. Electronically Signed: By: Nelson Chimes M.D. On: 01/20/2016 17:34    Procedures Procedures (including critical care time)  CRITICAL CARE Performed by: Wandra Arthurs   Total critical care time: 30  minutes  Critical care time was exclusive of separately billable procedures and treating other patients.  Critical care was necessary to treat or prevent imminent or life-threatening deterioration.  Critical care was time spent personally by me  on the following activities: development of treatment plan with patient and/or surrogate as well as nursing, discussions with consultants, evaluation of patient's response to treatment, examination of patient, obtaining history from patient or surrogate, ordering and performing treatments and interventions, ordering and review of laboratory studies, ordering and review of radiographic studies, pulse oximetry and re-evaluation of patient's condition.   Medications Ordered in ED Medications  morphine 4 MG/ML injection 4 mg (not administered)     Initial Impression / Assessment and Plan / ED Course  I have reviewed the triage vital signs and the nursing notes.  Pertinent labs & imaging results that were available during my care of the patient were reviewed by me and considered in my medical decision making (see chart for details).  Clinical Course    [name] is a 82 y.o. male here with R leg weakness, numbness. I consulted Dr. Leonel Ramsay, neuro on call, regarding patient. Recommend activating code stroke at 5:12 pm. Will do stroke workup.   5:40 pm CT head neg. Patient recently taken off [place]. Dr. Leonel Ramsay concerned for arterial clot to R leg. No dopplerable pulse R leg. Consulted Dr. Donzetta Matters from vascular.   6 pm  Vascular PA at bedside. Ordered heparin drip. Ordered CT angio.   7 pm Vascular wants to cancel CT angio. They will do angiogram in the OR. Recommend medicine admission given that he had a complicated admission recently and just got discharged. Will admit to  stepdown.     Final Clinical Impressions(s) / ED Diagnoses   Final diagnoses:  Stroke-like symptoms    New Prescriptions New Prescriptions   No medications on file     Drenda Freeze, MD 01/20/16 2344

## 2016-01-20 NOTE — ED Triage Notes (Addendum)
Pt presents with c/o leg pain. He was just discharged from the hospital after an admission for CHF. While in the car on the way home his R leg suddenly became painful, weak, and numb. The symptoms have been persistent since onset and the patient describes as severe. He is alert, breathing easily. Dr Darl Householder at triage to assess pt for stroke. No code stroke called at this time

## 2016-01-20 NOTE — H&P (Signed)
History and Physical    [name] WER:[id number] DOB: December 30, 1932 DOA: 01/20/2016  Referring MD/NP/PA: Dr Darl Householder, EDP PCP: [name], MD Outpatient Specialists: Patient coming from: Home  Chief Complaint: Right lower extremity pain  HPI: [name] is a 82 y.o. male with multiple complicated medical history including but not limited to CHF with systolic Dysfunction, CAD s/pCABG, COPD with chronic respiratory failure (on 2.5L [place] at baseline), atrial fibrillation, CKD 3, HTN, DM, PPM (former Bi-V ICD), and lung andprostate cancers.   He was discharged from the hospital today after presenting on 01/16/2016 with dyspnea, orthopnea, leg swelling and 7 lbs weight gain. CXR showedcardiomegaly, small left pleural effusion, associated with elevatedBNP of 1720 and Troponin of 0.03, with withv-paced rhythm on EKG. His hemoglobin was also noted to have acutely without any obvious source.  He was admitted for CHF exacerbation and symptomatic anemia, for which reason he was taken off Xarelto since 01/17/2016 due to suspected GI bleed.  He was treated in the hospital appropriately with careful diuresis with monitoring of electrolytes, having ruled out acute coronary syndrome, and his hematologic indices monitored. GI consultant saw him and felt nonoperative therapy was warranted and recommended 2 months of PPI therapy.   His overall medical condition improved and was discharged today in stable and satisfactory condition with plans to re-start Xarelto today. However, his daughter was driving him home and stopped by a pharmacy to pick up his prescriptions, and he complained of acute onset of right leg pain with some weakness and numbness,  without any obvious traumatic injury, for which reason he was brought back to the ED.      ED Course: He was started on heparin drip. Neurology was consulted for suspected stroke, and underwent imaging studies which were negative. Color change to the affected foot  was noted and so vascular surgery was consulted. He was noted to have right lower extremity acute ischemia and was planned for thrombectomy with possible bypass, but because of his multiple complicated medical conditions vascular surgery requested Hospitalist admission.   Patient denies chest pains, no overt shortness of breadth from baseline.  Review of Systems: As per HPI otherwise 10 point review of systems negative.   Past Medical History:  Diagnosis Date  . Arthritis   . Black lung disease ([place])   . CAD (coronary artery disease)    a. 07/2002 CABG x 3: LIMA->LAD, VG->Diag, VG->OM;  b. 06/2006 Cath: LM 50-60ost/p, LAD patent mid stent, D1 sev dzs, D2 patent stent, LCX nl, OM2 sev sten prox, RCA large/nl, VG->Diag nl, VG->OM nl, LIMA->LAD atretic, EF 30%.  . Carotid artery occlusion   . Chronic atrial fibrillation ([place])   . Chronic respiratory failure ([place])   . Chronic systolic CHF (congestive heart failure) ([place])    a. 12/2012 Echo: EF 20-25%, mid-dist antsept AK, mod dil LA.  [name] COPD (chronic obstructive pulmonary disease) ([place])    a. On home O2.  [name] CVA (cerebral vascular accident) ([place])   . Diabetes mellitus   . GERD (gastroesophageal reflux disease)   . Gout   . Hypercholesterolemia   . Hypertension   . Ischemic cardiomyopathy    a. 06/2006  BIV-ICD -> gen change 2015 to [place].  . Lung cancer ([place])   . Nephrolithiasis 09/2000  . On home oxygen therapy    "2.5L; 24/7" (01/16/2016)  . Prostate cancer ([place]) 11/01/10   gleason 7, 8, 9, gold seeds 02/08/11  . Symptomatic bradycardia  Past Surgical History:  Procedure Laterality Date  . BI-VENTRICULAR PACEMAKER INSERTION (CRT-P)  12-02-13   downgrade of previously implanted CRTD to STJ CRTP  . BIV PACEMAKER GENERATOR CHANGE OUT N/A 12/02/2013   Procedure: BIV PACEMAKER GENERATOR CHANGE OUT;  Surgeon: Evans Lance, MD;  Location: [facility] CATH LAB;  Service: Cardiovascular;  Laterality: N/A;  . COLONOSCOPY    . CORONARY  ANGIOPLASTY WITH STENT PLACEMENT  07/1997; 08/1997;03/1998  . CORONARY ARTERY BYPASS GRAFT  07/2002   CABG X3  . ESOPHAGOGASTRODUODENOSCOPY  02/19/2011   Procedure: ESOPHAGOGASTRODUODENOSCOPY (EGD);  Surgeon: Jeryl Columbia, MD;  Location: [facility] ENDOSCOPY;  Service: Endoscopy;  Laterality: N/A;  . INCISION AND DRAINAGE OF WOUND  08/2002   right thigh; S/P EVH  . INSERT / REPLACE / [place]; 1992; 01/2000;  . INSERT / REPLACE / REMOVE PACEMAKER  09/2003; 06/2006   w/AICD  . INSERT / REPLACE / REMOVE PACEMAKER  12/2004   pacmaker explant  . SHOULDER ARTHROSCOPY W/ ROTATOR CUFF REPAIR  05/2008   left  . upper endoscpopy       reports that he quit smoking about 66 years ago. His smoking use included Cigarettes. His smokeless tobacco use includes Chew. He reports that he does not drink alcohol or use drugs.  Allergies  Allergen Reactions  . Aspirin Hives  . Penicillins Rash    Rash Has patient had a PCN reaction causing immediate rash, facial/tongue/throat swelling, SOB or lightheadedness with hypotension:YES Has patient had a PCN reaction causing severe rash involving mucus membranes or skin necrosis: NO Has patient had a PCN reaction that required hospitalization NO Has patient had a PCN reaction occurring within the last 10 years:NO If all of the above answers are "NO", then may proceed with Cephalosporin use.    Family History  Problem Relation Age of Onset  . Alzheimer's disease Father 69  . Cancer Father 40    metastatic prostate cancer  . Diabetes Sister 36  . Diabetes Brother   . Diabetes Brother   . Diabetes Brother   . Hypotension Neg Hx   . Malignant hyperthermia Neg Hx   . Pseudochol deficiency Neg Hx     Prior to Admission medications   Medication Sig Start Date End Date Taking? Authorizing Provider  acetaminophen (TYLENOL) 500 MG tablet Take 500 mg by mouth every 6 (six) hours as needed (pain).    Historical Provider, MD  albuterol (PROVENTIL HFA;VENTOLIN HFA)  108 (90 Base) MCG/ACT inhaler Inhale 2 puffs into the lungs every 4 (four) hours as needed for wheezing or shortness of breath. 06/10/15   Shawn C Joy, PA-C  albuterol (PROVENTIL) (2.5 MG/3ML) 0.083% nebulizer solution Take 2.5 mg by nebulization every 6 (six) hours as needed for wheezing or shortness of breath.    Historical Provider, MD  allopurinol (ZYLOPRIM) 300 MG tablet Take 300 mg by mouth daily.      Historical Provider, MD  benzonatate (TESSALON) 100 MG capsule Take 1 capsule (100 mg total) by mouth every 8 (eight) hours. 06/10/15   Shawn C Joy, PA-C  CALCIUM-VITAMIN D PO Take 1 tablet by mouth 2 (two) times daily.     Historical Provider, MD  furosemide (LASIX) 20 MG tablet Take 20 mg by mouth daily.    Historical Provider, MD  guaifenesin (ROBITUSSIN) 100 MG/5ML syrup Take 200 mg by mouth 3 (three) times daily as needed for cough.    Historical Provider, MD  hydrocortisone cream 1 % Apply to affected  area 2 times daily Patient taking differently: Apply 1 application topically 2 (two) times daily as needed for itching. Apply to affected area 2 times daily 10/05/14   Fransico Meadow, PA-C  isosorbide mononitrate (IMDUR) 30 MG 24 hr tablet Take 30 mg by mouth daily. 01/11/16   Historical Provider, MD  metoprolol succinate (TOPROL-XL) 25 MG 24 hr tablet Take 25 mg by mouth daily. 07/27/13   Historical Provider, MD  nitroGLYCERIN (NITROSTAT) 0.4 MG SL tablet Place 0.4 mg under the tongue every 5 (five) minutes as needed for chest pain.    Historical Provider, MD  Omega-3 Fatty Acids (FISH OIL) 1000 MG CAPS Take 1,000 mg by mouth at bedtime.     Historical Provider, MD  omeprazole (PRILOSEC) 40 MG capsule Take 1 capsule (40 mg total) by mouth 2 (two) times daily. 01/20/16   Orson Eva, MD  OXYGEN Inhale 2.5 L into the lungs continuous.    Historical Provider, MD  Rivaroxaban (XARELTO) 15 MG TABS tablet Take 1 tablet (15 mg total) by mouth daily with supper. 10/27/15   Reyne Dumas, MD  spironolactone  (ALDACTONE) 25 MG tablet Take 0.5 tablets (12.5 mg total) by mouth daily. 01/21/16   Orson Eva, MD  vitamin B-12 (CYANOCOBALAMIN) 1000 MCG tablet Take 1,000 mcg by mouth 2 (two) times daily.    Historical Provider, MD    Physical Exam: Vitals:   01/20/16 1713 01/20/16 1730 01/20/16 1738 01/20/16 1845  BP: 164/81 164/77 164/77 135/77  Pulse: 68  65 60  Resp: '18 11 15 13  '$ Temp: 97.2 F (36.2 C)     TempSrc: Axillary     SpO2: 100%  98% 98%      Constitutional: NAD, calm, comfortable Vitals:   01/20/16 1713 01/20/16 1730 01/20/16 1738 01/20/16 1845  BP: 164/81 164/77 164/77 135/77  Pulse: 68  65 60  Resp: '18 11 15 13  '$ Temp: 97.2 F (36.2 C)     TempSrc: Axillary     SpO2: 100%  98% 98%   Eyes: PERRL,Scleral Pallor,  lids and conjunctivae normal ENMT: Mucous membranes are moist. Posterior pharynx clear of any exudate or lesions.Normal dentition.  Neck: normal, supple, no masses, no thyromegaly Respiratory: mildly diminished breadth sounds at the bases otherwise clear to auscultation. Normal respiratory effort. No accessory muscle use.  Cardiovascular: Regular rate and rhythm, no murmurs / rubs / gallops..(+) trace pedal edema. Non-palpable DP pulse on the right. No carotid bruits.  Abdomen: no tenderness, no masses palpated. No hepatosplenomegaly. Bowel sounds positive.  Musculoskeletal: no clubbing / cyanosis. No joint deformity upper and lower extremities. Mild weakness of right foot noted, ? Pain related.  Skin: Right Lower extremity skin noted to be pale, and cold. Neurologic: Alert oriented x 3.  Psychiatric: Normal judgment and insight. Normal mood.     Labs on Admission: I have personally reviewed following labs and imaging studies  CBC:  Recent Labs Lab 01/17/16 2220 01/18/16 0334 01/19/16 0304 01/20/16 0238 01/20/16 1735 01/20/16 1741  WBC 3.6* 3.6* 3.3* 3.2* 4.8  --   NEUTROABS  --   --   --   --  2.1  --   HGB 9.2* 9.9* 10.5* 10.6* 10.7* 11.6*  HCT  27.9* 30.0* 31.9* 32.6* 32.4* 34.0*  MCV 94.6 94.9 95.2 96.7 95.9  --   PLT 147* 146* 162 174 188  --    Basic Metabolic Panel:  Recent Labs Lab 01/17/16 0323 01/18/16 0334 01/19/16 0304 01/20/16 0238 01/20/16 1735 01/20/16  1741  NA 135 133* 133* 134* 131* 132*  K 4.5 4.4 4.1 4.1 4.9 4.8  CL 95* 92* 94* 94* 95* 95*  CO2 '28 31 27 30 24  '$ --   GLUCOSE 140* 92 88 119* 176* 178*  BUN 27* 40* 37* 41* 41* 53*  CREATININE 1.34* 1.59* 1.35* 1.41* 1.35* 1.40*  CALCIUM 9.0 9.0 8.8* 8.8* 8.7*  --   MG  --   --  2.2  --   --   --    GFR: Estimated Creatinine Clearance: 33.1 mL/min (by C-G formula based on SCr of 1.4 mg/dL (H)). Liver Function Tests:  Recent Labs Lab 01/16/16 2203 01/20/16 1735  AST 21 33  ALT 21 25  ALKPHOS 105 112  BILITOT 1.7* 1.5*  PROT 7.5 7.1  ALBUMIN 3.4* 3.6   No results for input(s): LIPASE, AMYLASE in the last 168 hours. No results for input(s): AMMONIA in the last 168 hours. Coagulation Profile:  Recent Labs Lab 01/20/16 1735  INR 1.07   Cardiac Enzymes:  Recent Labs Lab 01/16/16 2203 01/17/16 0323 01/17/16 1107  TROPONINI <0.03 <0.03 <0.03   BNP (last 3 results) No results for input(s): PROBNP in the last 8760 hours. HbA1C: No results for input(s): HGBA1C in the last 72 hours. CBG:  Recent Labs Lab 01/18/16 1131 01/18/16 1636 01/18/16 2109 01/19/16 0658 01/20/16 1733  GLUCAP 91 93 97 96 170*   Lipid Profile: No results for input(s): CHOL, HDL, LDLCALC, TRIG, CHOLHDL, LDLDIRECT in the last 72 hours. Thyroid Function Tests: No results for input(s): TSH, T4TOTAL, FREET4, T3FREE, THYROIDAB in the last 72 hours. Anemia Panel: No results for input(s): VITAMINB12, FOLATE, FERRITIN, TIBC, IRON, RETICCTPCT in the last 72 hours. Urine analysis:    Component Value Date/Time   COLORURINE YELLOW 11/10/2014 1815   APPEARANCEUR CLEAR 11/10/2014 1815   LABSPEC 1.025 11/10/2014 1815   LABSPEC 1.015 05/03/2011 0950   PHURINE 7.0  11/10/2014 1815   GLUCOSEU NEGATIVE 11/10/2014 1815   HGBUR NEGATIVE 11/10/2014 1815   BILIRUBINUR NEGATIVE 11/10/2014 1815   BILIRUBINUR Color Interference 05/03/2011 0950   KETONESUR NEGATIVE 11/10/2014 1815   PROTEINUR 30 (A) 11/10/2014 1815   UROBILINOGEN 0.2 11/10/2014 1815   NITRITE NEGATIVE 11/10/2014 1815   LEUKOCYTESUR NEGATIVE 11/10/2014 1815   LEUKOCYTESUR Color Interference 05/03/2011 0950   Sepsis Labs: '@LABRCNTIP'$ (procalcitonin:4,lacticidven:4) ) Recent Results (from the past 240 hour(s))  MRSA PCR Screening     Status: None   Collection Time: 01/17/16  3:34 AM  Result Value Ref Range Status   MRSA by PCR NEGATIVE NEGATIVE Final    Comment:        The GeneXpert MRSA Assay (FDA approved for NASAL specimens only), is one component of a comprehensive MRSA colonization surveillance program. It is not intended to diagnose MRSA infection nor to guide or monitor treatment for MRSA infections.      Radiological Exams on Admission: Ct Head Code Stroke Wo Contrast`  Addendum Date: 01/20/2016   ADDENDUM REPORT: 01/20/2016 17:39 ADDENDUM: These results were called by telephone at the time of interpretation on 01/20/2016 at 5:37 pm to Dr. Leonel Ramsay, who verbally acknowledged these results. Electronically Signed   By: Nelson Chimes M.D.   On: 01/20/2016 17:39   Result Date: 01/20/2016 CLINICAL DATA:  Code stroke.  Code stroke.  Right leg numbness. EXAM: CT HEAD WITHOUT CONTRAST TECHNIQUE: Contiguous axial images were obtained from the base of the skull through the vertex without intravenous contrast. COMPARISON:  06/04/2013.  02/15/2013. FINDINGS:  Brain: Generalized brain atrophy. Old small vessel infarction inferior cerebellum on the right. Chronic small-vessel ischemic change of the hemispheric white matter. No evidence of acute infarction, mass lesion, hemorrhage, hydrocephalus or extra-axial collection. Vascular: There is atherosclerotic calcification of the major vessels  at the base of the brain. Skull: Negative Sinuses/Orbits: Clear/normal Other: None significant ASPECTS ([place] Stroke Program Early CT Score) - Ganglionic level infarction (caudate, lentiform nuclei, internal capsule, insula, M1-M3 cortex): 7 - Supraganglionic infarction (M4-M6 cortex): 3 Total score (0-10 with 10 being normal): 10 IMPRESSION: 1. Atrophy. No acute finding. Old small vessel infarction inferior cerebellum on the right. 2. ASPECTS is 10 Call report in process. Electronically Signed: By: Nelson Chimes M.D. On: 01/20/2016 17:34    EKG: Independently reviewed; Paced Rythm  Assessment/Plan Active Problems:   * No active hospital problems. *   1. ACUTE CRITICAL RIGHT LIMB ISCHEMIA:  Vascular surgery taking him to OR this evening as noted above. Follow H/H on anticoagualtion. Pain Support. Further recommendations pos-operatively.  2. Normocytic Anemia:  Serial H/H.  Re-consult GI   3. DM2:  Monitor CBG's, SSI prn  4.CAD:  Stable; keep H/H optimal    DVT prophylaxis: (Heparin ) Code Status: (DNR) Family Communication: Keiton Cosma, Daughter( tel # [phone]) Disposition Plan: To be determined Consults called: Dr Donzetta Matters, Vascular Surgery Admission status:  (SDU for now but may change post-operatively)   OSEI-BONSU,Aly Hauser MD [facility] Pager [phone]  If 7PM-7AM, please contact night-coverage [email or web address] Password [email or web address]  01/20/2016, 7:04 PM

## 2016-01-20 NOTE — Care Management Important Message (Signed)
Important Message  Patient Details  Name: [name] MRN: [id number] Date of Birth: [birth year]   Medicare Important Message Given:  Yes    [name] Montine Circle 01/20/2016, 11:41 AM

## 2016-01-20 NOTE — Transfer of Care (Signed)
Immediate Anesthesia Transfer of Care Note  Patient: [name]  Procedure(s) Performed: Procedure(s): THROMBECTOMY OF POPLITEAL ARTERY AND ANTERIOR TIBIAL ARTERY RIGHT LEG; INTRAOPERATIVE ARTERIOGRAM (Right)  Patient Location: PACU  Anesthesia Type:General  Level of Consciousness: awake, alert  and patient cooperative  Airway & Oxygen Therapy: Patient Spontanous Breathing and Patient connected to nasal cannula oxygen  Post-op Assessment: Report given to RN and Post -op Vital signs reviewed and stable  Post vital signs: Reviewed and stable  Last Vitals:  Vitals:   01/20/16 1845 01/20/16 1900  BP: 135/77 133/81  Pulse: 60 62  Resp: 13 17  Temp:      Last Pain:  Vitals:   01/20/16 1713  TempSrc: Axillary  PainSc:          Complications: No apparent anesthesia complications

## 2016-01-20 NOTE — OR Nursing (Signed)
49 French Foley Catheter and 14 French Foley Catheter attempted without success.  Resistance met and could not get catheter to pass through prostrate. Dr. Donzetta Matters notified.

## 2016-01-20 NOTE — ED Notes (Signed)
Per neuro, code stroke cancelled, vascular to see patient

## 2016-01-20 NOTE — Op Note (Signed)
    Patient name: AMIRE LEAZER MRN: [id number] DOB: 11/10/1932 Sex: male  01/20/2016 Pre-operative Diagnosis: acute right lower extremity ischemia Post-operative diagnosis:  Same Surgeon:  Erlene Quan C. Donzetta Matters, MD Procedure Performed: 1.  Right below knee popliteal artery exposure 2.  Right lower extremity thromboembolectomy 3.  Right lower extremity angiogram  Indications:  82 year old white male history of CHF recently admitted with exacerbation. He also has history of atrial fibrillation on xarelto but that has been held with concern for GI bleed with recent hospitalization. He is now indicated for urgent thromboembolectomy of right lower extremity.  Findings: There was extensive acute appearing clot pooled from the proximal arterial system in the right lower extremity. There was also clot returned from the anterior tibial artery. The peak TP trunk is calcified and chronically occluded. Runoff at completion is via the AT to the level of the proximal foot and there is a multiphasic signal at that level.   Procedure:  The patient was identified in the holding area and taken to the operating room placed supine on the operating table general anesthesia induced, given antibiotics and sterilely prepped and draped in the usual fashion. Timeout was called. I then made an incision along a previous vein harvest site on the right medial leg just below the knee. I dissected down through scar tissue identified the fascia and divided this. Gastrocnemius muscle was retracted posteriorly along with the soleus muscle and identified the popliteal neurovascular bundle bluntly. I then proximally dissected out the artery and placed a vessel loop around it. The anterior tibial vein was divided between ties to expose the anterior tibial artery. TP trunk was then also exposed sharply. This was noted to be calcified. Patient was given an additional 3000 units of heparin having already been heparinized prior to our procedure.  I opened the artery transversely at the level of the anterior tibial artery. I passed a 3 Fogarty down the AT and returned acute appearing clot. There was minimal backbleeding I flushed the vessel with heparinized saline and clamped it with a Serafin. I then passed a 4 Fogarty up to the level of the external iliac artery proximally. I returns significant clot and had a strong rush of blood. I held pressure on the artery and again passed a 4 Fogarty until no returning clot was found. I then clamped the below-knee popliteal artery and there was a strong pulse within it. A 3 Fogarty was attempted to be passed down the TP trunk but I could not given calcification. With this I closed my arteriotomy with 6-0 Prolene suture. At the below-knee popliteal I then cannulated with 22-gauge needle and performed right lower extremity angiogram with runoff via the anterior tibial artery to the level of the proximal foot. Satisfied this was removed and arteriotomy closed with 6-0 Prolene suture. I obtained hemostasis of the wound and closed in layers with Vicryl the skin with Monocryl and Dermabond above that. Patient did have a multiphasic signal level of the proximal foot. He was awakened from anesthesia and will be admitted to the medical service.  Blood loss: 150cc  Contrast: 15cc   Shamus Desantis C. Donzetta Matters, MD [facility] [place] Office: [phone] Pager: [phone]

## 2016-01-20 NOTE — Consult Note (Signed)
Vascular and Vein Specialist of [facility]  Patient name: [name] MRN: [id number] DOB: 03-17-1932 Sex: male  REASON FOR CONSULT: cold right lower extremity with numbness and weakness, consult is from EDP  HPI: [name] is a 82 y.o. male, who presents with two hour history of sudden right foot pain. The patient was just discharged from the hospital this afternoon. While on his way to pick up medications with his daughter, he noticed sudden pain in his foot with weakness and numbness. A code stroke was called in the ED and CT of the head was negative for stroke. He was admitted on 01/16/16 with CHF exacerbation. He was found to have an acute drop in his hemoglobin from 8.5 to 7.7 overnight and his Xarelto was held (01/17/16) pending GI workup. GI recommended service management with a 2 month course of PPI. He was discharged today with plans to resume his Xarelto today. He has not had a dose of Xarelto today. The patient has been nothing by mouth since noon today. He denies having any history of rest pain or claudication. He reports having an incident where he "froze" his feet when he was younger and therefore has diminished sensation at baseline.  He has an extensive past medical history including atrial fibrillation on Xarelto. CAD status post CABG 3 in 2004. He has had multiple pacemaker insertions. Has a history of prior CVA. He has been seen in 2015 by Dr.Early for carotid disease and found to have an occluded left internal carotid artery. He has COPD on home O2. He also has diabetes, hypertension and CKD stage III. He is a former smoker.   He currently denies any chest pain or shortness of breath.  Past Medical History:  Diagnosis Date  . Arthritis   . Black lung disease ([place])   . CAD (coronary artery disease)    a. 07/2002 CABG x 3: LIMA->LAD, VG->Diag, VG->OM;  b. 06/2006 Cath: LM 50-60ost/p, LAD patent mid stent, D1 sev dzs, D2 patent stent, LCX nl, OM2 sev sten prox, RCA large/nl,  VG->Diag nl, VG->OM nl, LIMA->LAD atretic, EF 30%.  . Carotid artery occlusion   . Chronic atrial fibrillation ([place])   . Chronic respiratory failure ([place])   . Chronic systolic CHF (congestive heart failure) ([place])    a. 12/2012 Echo: EF 20-25%, mid-dist antsept AK, mod dil LA.  [name] COPD (chronic obstructive pulmonary disease) ([place])    a. On home O2.  [name] CVA (cerebral vascular accident) ([place])   . Diabetes mellitus   . GERD (gastroesophageal reflux disease)   . Gout   . Hypercholesterolemia   . Hypertension   . Ischemic cardiomyopathy    a. 06/2006  BIV-ICD -> gen change 2015 to [place].  . Lung cancer ([place])   . Nephrolithiasis 09/2000  . On home oxygen therapy    "2.5L; 24/7" (01/16/2016)  . Prostate cancer ([place]) 11/01/10   gleason 7, 8, 9, gold seeds 02/08/11  . Symptomatic bradycardia     Family History  Problem Relation Age of Onset  . Alzheimer's disease Father 87  . Cancer Father 39    metastatic prostate cancer  . Diabetes Sister 94  . Diabetes Brother   . Diabetes Brother   . Diabetes Brother   . Hypotension Neg Hx   . Malignant hyperthermia Neg Hx   . Pseudochol deficiency Neg Hx     SOCIAL HISTORY: Social History   Social History  . Marital status: Widowed  Spouse name: N/A  . Number of children: N/A  . Years of education: N/A   Occupational History  . retired Retired    Equities trader   Social History Main Topics  . Smoking status: Former Smoker    Types: Cigarettes    Quit date: 03/12/1949  . Smokeless tobacco: Current User    Types: Chew  . Alcohol use No     Comment: drank until per pt 03/19/11, h/o heavy use  . Drug use: No  . Sexual activity: Yes   Other Topics Concern  . Not on file   Social History Narrative  . No narrative on file    Allergies  Allergen Reactions  . Aspirin Hives  . Penicillins Rash    Rash Has patient had a PCN reaction causing immediate rash, facial/tongue/throat swelling, SOB or lightheadedness with  hypotension:YES Has patient had a PCN reaction causing severe rash involving mucus membranes or skin necrosis: NO Has patient had a PCN reaction that required hospitalization NO Has patient had a PCN reaction occurring within the last 10 years:NO If all of the above answers are "NO", then may proceed with Cephalosporin use.    Current Facility-Administered Medications  Medication Dose Route Frequency Provider Last Rate Last Dose  . heparin ADULT infusion 100 units/mL (25000 units/278m sodium chloride 0.45%)  750 Units/hr Intravenous Continuous TTyrone Apple RPH 7.5 mL/hr at 01/20/16 1822 750 Units/hr at 01/20/16 1822  . iopamidol (ISOVUE-370) 76 % injection            Current Outpatient Prescriptions  Medication Sig Dispense Refill  . acetaminophen (TYLENOL) 500 MG tablet Take 500 mg by mouth every 6 (six) hours as needed (pain).    .Marland Kitchenalbuterol (PROVENTIL HFA;VENTOLIN HFA) 108 (90 Base) MCG/ACT inhaler Inhale 2 puffs into the lungs every 4 (four) hours as needed for wheezing or shortness of breath. 1 Inhaler 2  . albuterol (PROVENTIL) (2.5 MG/3ML) 0.083% nebulizer solution Take 2.5 mg by nebulization every 6 (six) hours as needed for wheezing or shortness of breath.    . allopurinol (ZYLOPRIM) 300 MG tablet Take 300 mg by mouth daily.      . benzonatate (TESSALON) 100 MG capsule Take 1 capsule (100 mg total) by mouth every 8 (eight) hours. 21 capsule 0  . CALCIUM-VITAMIN D PO Take 1 tablet by mouth 2 (two) times daily.     . furosemide (LASIX) 20 MG tablet Take 20 mg by mouth daily.    .Marland Kitchenguaifenesin (ROBITUSSIN) 100 MG/5ML syrup Take 200 mg by mouth 3 (three) times daily as needed for cough.    . hydrocortisone cream 1 % Apply to affected area 2 times daily (Patient taking differently: Apply 1 application topically 2 (two) times daily as needed for itching. Apply to affected area 2 times daily) 15 g 0  . isosorbide mononitrate (IMDUR) 30 MG 24 hr tablet Take 30 mg by mouth daily.  1  .  metoprolol succinate (TOPROL-XL) 25 MG 24 hr tablet Take 25 mg by mouth daily.    . nitroGLYCERIN (NITROSTAT) 0.4 MG SL tablet Place 0.4 mg under the tongue every 5 (five) minutes as needed for chest pain.    . Omega-3 Fatty Acids (FISH OIL) 1000 MG CAPS Take 1,000 mg by mouth at bedtime.     .Marland Kitchenomeprazole (PRILOSEC) 40 MG capsule Take 1 capsule (40 mg total) by mouth 2 (two) times daily. 60 capsule 1  . OXYGEN Inhale 2.5 L into the lungs continuous.    .[name]  Rivaroxaban (XARELTO) 15 MG TABS tablet Take 1 tablet (15 mg total) by mouth daily with supper. 30 tablet 1  . [START ON 01/21/2016] spironolactone (ALDACTONE) 25 MG tablet Take 0.5 tablets (12.5 mg total) by mouth daily. 30 tablet 0  . vitamin B-12 (CYANOCOBALAMIN) 1000 MCG tablet Take 1,000 mcg by mouth 2 (two) times daily.      REVIEW OF SYSTEMS:  '[X]'$  denotes positive finding, '[ ]'$  denotes negative finding Cardiac  Comments:  Chest pain or chest pressure:    Shortness of breath upon exertion:    Short of breath when lying flat:    Irregular heart rhythm: x afib      Vascular    Pain in calf, thigh, or hip brought on by ambulation:    Pain in feet at night that wakes you up from your sleep:     Blood clot in your veins:    Leg swelling:         Pulmonary    Oxygen at home: x   Productive cough:     Wheezing:         Neurologic    Sudden weakness in arms or legs:  x Right foot  Sudden numbness in arms or legs:  x Right foot  Sudden onset of difficulty speaking or slurred speech:    Temporary loss of vision in one eye:     Problems with dizziness:         Gastrointestinal    Blood in stool:     Vomited blood:         Genitourinary    Burning when urinating:     Blood in urine:        Psychiatric    Major depression:         Hematologic    Bleeding problems:    Problems with blood clotting too easily:        Skin    Rashes or ulcers:        Constitutional    Fever or chills:      PHYSICAL EXAM: Vitals:    01/20/16 1713 01/20/16 1730 01/20/16 1738  BP: 164/81 164/77 164/77  Pulse: 68  65  Resp: '18 11 15  '$ Temp: 97.2 F (36.2 C)    TempSrc: Axillary    SpO2: 100%  98%    GENERAL: The patient is a well-nourished male, in no acute distress. The vital signs are documented above. CARDIAC: There is a regular rate and rhythm.  VASCULAR: Palpable femoral pulses bilaterally. On the right, which is the extremity of concern, nonpalpable right popliteal and pedal pulses. Right foot is cold and pale. Palpable left popliteal pulse. Audible left DP, PT and peroneal doppler signals.  PULMONARY: There is good air exchange bilaterally without wheezing or rales. MUSCULOSKELETAL: There are no major deformities.  NEUROLOGIC: There is diminished sensation to bilateral feet at baseline. Minimally moving right toes. Normal motor function on the left. Moving upper extremities. Alert and oriented.  SKIN: There are no ulcers or rashes noted. PSYCHIATRIC: The patient has a normal affect.   MEDICAL ISSUES: Acute right lower extremity ischemia  The patient presented with a 2 hour history of sudden right foot pain with weakness and numbness. He was just discharged from the hospital today after being admitted for CHF exacerbation. While in the hospital, he had an acute drop in hemoglobin and his Xarelto was held. He is on Xarelto for atrial fibrillation. He does have an ICD. He has been  off his Xarelto since 01/17/2016. He was supposed to restart his Xarelto tonight. A code stroke was called in the ED today given his right lower extremity symptoms. A CT of the head was negative. He has no Doppler signals to the right foot. He does have a palpable right femoral pulse. He has diminished motor function to his right foot. He has diminished sensation to his feet bilaterally at baseline. Likely cardioembolic event while off of Xarelto to right lower extremity. He has been NPO since noon.  Discussed with ED physician and Dr. Donzetta Matters who  is currently in the OR. Plan to take patient to operating room today for thrombectomy of right lower extremity with possible lower extremity bypass. Plan for hospitalist admission given multiple medical issues. Dr. Donzetta Matters to evaluate after done in the OR.     Virgina Jock, PA-C [facility] [place] [phone]    I have independently interviewed patient and agree with PA assessment and plan above. Patient off [place] now with [place] 2b acute ischemia. Will take to OR for thrombectomy and likely angiogram. He has significant risks of cardiac issues and leg amputation during this hospitalization and I have made this clear to him and demonstrates good understanding. Does not want to be dnr at this time.  Brandon C. Donzetta Matters, MD [facility] [place] Office: [phone] Pager: [phone]

## 2016-01-20 NOTE — Anesthesia Preprocedure Evaluation (Signed)
Anesthesia Evaluation  Patient identified by MRN, date of birth, ID band Patient awake    Reviewed: Allergy & Precautions, H&P , NPO status , Patient's Chart, lab work & pertinent test results  Airway Mallampati: II   Neck ROM: full    Dental   Pulmonary COPD,  oxygen dependent, former smoker,    breath sounds clear to auscultation       Cardiovascular hypertension, + CAD, + Past MI, + Cardiac Stents, + CABG, + Peripheral Vascular Disease and +CHF  + dysrhythmias Atrial Fibrillation + pacemaker + Cardiac Defibrillator  Rhythm:regular Rate:Normal  EF 25%   Neuro/Psych CVA    GI/Hepatic GERD  ,  Endo/Other  diabetes, Type 2  Renal/GU Renal InsufficiencyRenal disease     Musculoskeletal  (+) Arthritis ,   Abdominal   Peds  Hematology   Anesthesia Other Findings   Reproductive/Obstetrics                             Anesthesia Physical Anesthesia Plan  ASA: IV and emergent  Anesthesia Plan: General   Post-op Pain Management:    Induction: Intravenous  Airway Management Planned: Oral ETT  Additional Equipment:   Intra-op Plan:   Post-operative Plan: Extubation in OR and Possible Post-op intubation/ventilation  Informed Consent: I have reviewed the patients History and Physical, chart, labs and discussed the procedure including the risks, benefits and alternatives for the proposed anesthesia with the patient or authorized representative who has indicated his/her understanding and acceptance.     Plan Discussed with: CRNA, Anesthesiologist and Surgeon  Anesthesia Plan Comments:         Anesthesia Quick Evaluation

## 2016-01-20 NOTE — ED Notes (Signed)
CT came to pick up patient.  Confirmed with MD to cancel.  CT transport informed.

## 2016-01-20 NOTE — Progress Notes (Signed)
Physical Therapy Treatment Patient Details Name: [name] MRN: [id number] DOB: 27-Feb-1933 Today's Date: 01/20/2016    History of Present Illness Pt is an 82 y/o male admitted secondary to dyspnea and weight gain. PMH including but not limited to CHF, CAD s/p CABG in 2004, COPD, HTN, A-fib, CKD, hx of CVA, colon cancer and prostate cancer.    PT Comments    Pt presented supine in bed with HOB elevated, awake and willing to participate in therapy session. Pt on RA during ambulation with SPO2 monitored throughout, maintaining at 88-97%. PT reapplied O2 via [place] at end of session. Pt making good progress towards achieving his functional goals; however, he continues to demonstrate decreased safety awareness throughout but no LOB or need for physical assist. Pt would continue to benefit from skilled physical therapy services at this time while admitted and after d/c to address his limitations in order to improve his overall safety and independence with functional mobility.   Follow Up Recommendations  Home health PT;Supervision for mobility/OOB (safety assessment)     Equipment Recommendations  None recommended by PT    Recommendations for Other Services       Precautions / Restrictions Precautions Precautions: Fall Restrictions Weight Bearing Restrictions: No    Mobility  Bed Mobility Overal bed mobility: Modified Independent Bed Mobility: Supine to Sit;Sit to Supine     Supine to sit: Modified independent (Device/Increase time) Sit to supine: Modified independent (Device/Increase time)      Transfers Overall transfer level: Needs assistance Equipment used: None Transfers: Sit to/from Stand Sit to Stand: Supervision         General transfer comment: pt required increased time and supervision for safety  Ambulation/Gait Ambulation/Gait assistance: Min guard Ambulation Distance (Feet): 200 Feet Assistive device: Rolling walker (2 wheeled) Gait Pattern/deviations:  Step-through pattern;Decreased stride length;Drifts right/left Gait velocity: decreased   General Gait Details: pt required frequent VC'ing for safety with use of RW (i.e. - to maintain a safe distance and to maintain bilateral UEs on RW during ambulation)   Stairs            Wheelchair Mobility    Modified Rankin (Stroke Patients Only)       Balance Overall balance assessment: Needs assistance;History of Falls Sitting-balance support: Feet supported;No upper extremity supported Sitting balance-Leahy Scale: Fair     Standing balance support: During functional activity;No upper extremity supported Standing balance-Leahy Scale: Fair                      Cognition Arousal/Alertness: Awake/alert Behavior During Therapy: WFL for tasks assessed/performed Overall Cognitive Status: No family/caregiver present to determine baseline cognitive functioning Area of Impairment: Memory;Following commands;Safety/judgement;Problem solving     Memory: Decreased short-term memory Following Commands: Follows one step commands consistently;Follows multi-step commands inconsistently Safety/Judgement: Decreased awareness of safety;Decreased awareness of deficits Awareness: Emergent Problem Solving: Slow processing;Requires verbal cues      Exercises      General Comments        Pertinent Vitals/Pain Pain Assessment: No/denies pain    Home Living                      Prior Function            PT Goals (current goals can now be found in the care plan section) Acute Rehab PT Goals Patient Stated Goal: return home PT Goal Formulation: With patient Time For Goal Achievement: 02/01/16 Potential to Achieve Goals: Good  Progress towards PT goals: Progressing toward goals    Frequency    Min 3X/week      PT Plan Current plan remains appropriate    Co-evaluation             End of Session   Activity Tolerance: Patient tolerated treatment  well Patient left: in bed;with call bell/phone within reach;with bed alarm set     Time: 5041-3643 PT Time Calculation (min) (ACUTE ONLY): 17 min  Charges:  $Gait Training: 8-22 mins                    G CodesClearnce Sorrel Ugonna Keirsey January 29, 2016, 3:33 PM Sherie Don, [place], DPT [phone]

## 2016-01-20 NOTE — Consult Note (Signed)
Neurology Consultation Reason for Consult: right leg numbness/weakness Referring Physician: Darl Householder, D  CC: Right leg numbness/weakness  History is obtained from: Patient  HPI: [name] is a 82 y.o. male was on his way home from the hospital after being admitted for congestive heart failure. He was admitted with anemia and there is concern for bleeding so Xarelto was held, but he was told to restart it though he had not yet. He developed right leg pain, numbness and weakness and therefore represented to the emergency department. After I discussed with the ED physician, code stroke was called. On evaluation, the patient's leg was found to be weak and numb with non-dopplerable pulses.  LKW: 1500 tpa given?: no, not stroke  ROS: A 14 point ROS was performed and is negative except as noted in the HPI.   Past Medical History:  Diagnosis Date  . Arthritis   . Black lung disease ([place])   . CAD (coronary artery disease)    a. 07/2002 CABG x 3: LIMA->LAD, VG->Diag, VG->OM;  b. 06/2006 Cath: LM 50-60ost/p, LAD patent mid stent, D1 sev dzs, D2 patent stent, LCX nl, OM2 sev sten prox, RCA large/nl, VG->Diag nl, VG->OM nl, LIMA->LAD atretic, EF 30%.  . Carotid artery occlusion   . Chronic atrial fibrillation ([place])   . Chronic respiratory failure ([place])   . Chronic systolic CHF (congestive heart failure) ([place])    a. 12/2012 Echo: EF 20-25%, mid-dist antsept AK, mod dil LA.  [name] COPD (chronic obstructive pulmonary disease) ([place])    a. On home O2.  [name] CVA (cerebral vascular accident) ([place])   . Diabetes mellitus   . GERD (gastroesophageal reflux disease)   . Gout   . Hypercholesterolemia   . Hypertension   . Ischemic cardiomyopathy    a. 06/2006  BIV-ICD -> gen change 2015 to [place].  . Lung cancer ([place])   . Nephrolithiasis 09/2000  . On home oxygen therapy    "2.5L; 24/7" (01/16/2016)  . Prostate cancer ([place]) 11/01/10   gleason 7, 8, 9, gold seeds 02/08/11  . Symptomatic bradycardia       Family History  Problem Relation Age of Onset  . Alzheimer's disease Father 45  . Cancer Father 70    metastatic prostate cancer  . Diabetes Sister 49  . Diabetes Brother   . Diabetes Brother   . Diabetes Brother   . Hypotension Neg Hx   . Malignant hyperthermia Neg Hx   . Pseudochol deficiency Neg Hx      Social History:  reports that he quit smoking about 66 years ago. His smoking use included Cigarettes. His smokeless tobacco use includes Chew. He reports that he does not drink alcohol or use drugs.   Exam: Current vital signs: BP 164/81   Pulse 68   Temp 97.2 F (36.2 C) (Axillary)   Resp 18   SpO2 100%  Vital signs in last 24 hours: Temp:  [97.2 F (36.2 C)-98.4 F (36.9 C)] 97.2 F (36.2 C) (11/10 1713) Pulse Rate:  [64-68] 68 (11/10 1713) Resp:  [17-18] 18 (11/10 1713) BP: (106-164)/(62-81) 164/81 (11/10 1713) SpO2:  [100 %] 100 % (11/10 1713) Weight:  [56.5 kg (124 lb 8 oz)] 56.5 kg (124 lb 8 oz) (11/10 0425)   Physical Exam  Constitutional: Appears well-developed and well-nourished.  Psych: Affect appropriate to situation Eyes: No scleral injection HENT: No OP obstrucion Head: Normocephalic.  Cardiovascular: Normal rate and regular rhythm.  Respiratory: Effort normal and breath sounds normal  to anterior ascultation GI: Soft.  No distension. There is no tenderness.  Skin: WDI Extremities: He has a pale, cool right lower extremity with non-dopplerable pulse  Neuro: Mental Status: Patient is awake, alert, oriented to person, place, month, year, and situation. Patient is able to give a clear and coherent history. No signs of aphasia or neglect Cranial Nerves: II: Visual Fields are full. Pupils are equal, round, and reactive to light.   III,IV, VI: EOMI without ptosis or diploplia.  V: Facial sensation is symmetric to temperature VII: Facial movement is symmetric.  VIII: hearing is intact to voice X: Uvula elevates symmetrically XI: Shoulder  shrug is symmetric. XII: tongue is midline without atrophy or fasciculations.  Motor: Tone is normal. Bulk is normal. 5/5 strength was present in bilateral upper extremities, and left lower extremity, he has 3/5 weakness of his right lower extremity Sensory: Sensation is decreased to all modalities below the knee.  Cerebellar:  finger-nose-finger intact bilaterally, unable to perform heel-knee-shin on the  right  I have reviewed labs in epic and the results pertinent to this consultation are: Mildly elevated creatinine   I have reviewed the images obtained: CT head-unremarkable  Impression: He has no evidence on exam of a central cause for his right lower extremity weakness, and the lack of pulse and asymmetry of color would strongly support a peripheral vascular etiology to his current symptoms.  Recommendations: 1)Recommend vascular surgery consultation  2) no further neurodiagnostic evaluation at this time unless he were to develop other symptoms.  3) neurology will sign off, please call if any further questions remain.    Roland Rack, MD [facility] [phone]  If 7pm- 7am, please page neurology on call as listed in [place].

## 2016-01-20 NOTE — Discharge Summary (Addendum)
Physician Discharge Summary  [name] ZOX:[id number] DOB: [birth year] DOA: 01/16/2016  PCP: [name], MD  Admit date: 01/16/2016 Discharge date: 01/20/2016  Admitted From: Home Disposition:  Home  Recommendations for Outpatient Follow-up:  1. Follow up with PCP in 1-2 weeks 2. Recheck CBC and BMP in one week  Home Health: yes Equipment/Devices: HHPT, oxygen 2.5L  Discharge Condition:stable CODE STATUS:FULL Diet recommendation: Heart Healthy   Brief/Interim Summary: 82 y.o.gentleman with a history of systolic heart failure, CAD s/pCABG, COPD with chronic respiratory failure (on 2.5L [place] at baseline), atrial fibrillation, CKD 3, HTN, DM, PPM (former Bi-V ICD), lung andprostate cancers who presented 11/6 for dyspnea, orthopnea, leg swelling and 7 lbs weight gain. CXR showedcardiomegaly, small left pleural effusion, and interstitial fibrosis.BNP elevated to 1720. Troponin 0.03.EKG withv-paced rhythm. Hemoglobin was also noted to be acutely low, though he denies overt GI bleeding.Lasix '40mg'$  IV was given and patient was admitted for CHF exacerbation and symptomatic anemia work up.  GI was consulted and felt nonoperative therapy was warranted and recommended 2 months of PPI therapy.  Discharge Diagnoses:  Acute exacerbation of chronic systolic heart failure: Last echo 2014 with EF 20-25%, mild LVH, and anteroseptal akinesis.  -Weight on admission 130lbs. Presumed EDW ~125 - Continue IV diuresis-->switch to po lasix 01/19/16 am -NEG 7 lbs, Neg 3.3 L -discharge weight 124 lbs  -Restart IV Lasix-->home with lasix 20 mg daily - 11/8/17echocardiogram--EF 10-15%, diffuse HK, reduced RV function - Continue beta blocker, hold ACE inhibitor secondary to CKD - add low dose spironolactone 12.5 mg daily  Acute on chronic anemia:Normocytic, iron studies essentially normal with Tsat 19%. -baseline Hgb 9-10 +FOBT but denies overt GI bleeding. History of prostate and lung CA's,  - Hgb  8.5 >7.7 overnight 11/6-11/7: Given significant CVD and dyspnea, -transfused one unit PRBC for the admission - restart xarelto and monitor Hgb--repeat cbc in one week - Check CEA--4.2 - AppreciateDr. Amedeo Plenty (EGD with [facility] in 2012 showed gastritis and hiatal hernia)-->start PPI, treat medically, follow Hgb -01/17/16--CT abd--few distended fluid filled SB loops w/o acute inflammation or bowel obstruction, new small bilateral pleural effusion  CKD stage III: Baseline Cr ~1.1-1.3,  -serum creatinine 1.31 on admission.  - Hold ACE inhibitor - Monitor on diuretic therapy and s/p contrast for CT 11/7 -serum creatinine 1.41 on day of d/c  Chronic respiratory failure with hypoxia/COPD -normally on 2.5 L at home -presently stable on 2.5 L -continue brochodilators  AFib s/p ACID:  -CHADS-Vasc score of at least 5, anticoagulated with xarelto. Also has history of symptomatic bradycardia.  -Digoxin discontinued at last admission due to CKD/age.  - Continue beta blocker - Telemetry -restart xarelto and monitor hgb  CAD s/p CABG 2004:  -Nuclear stress test in Aug. 17,2017 showed large fixed defect without ischemia but no reversible ischemia per cardiology  - Continue BB, imdur - Holding ACE due to renal failure - ?not on a statin - restart lipitor, which was started at last admission (ACS r/o)  History of DM:  -HbA1c 5.4% in Aug 2017.  - Monitor CBG with BMPs - Carb modified, heart-healthy diet  History of falls: With recent right periorbital bruising.  - PT/OT-->HHPT for safety assessment    Discharge Instructions  Discharge Instructions    Diet - low sodium heart healthy    Complete by:  As directed    Increase activity slowly    Complete by:  As directed        Medication List    STOP  taking these medications   lisinopril 10 MG tablet Commonly known as:  PRINIVIL,ZESTRIL     TAKE these medications   acetaminophen 500 MG tablet Commonly known as:  TYLENOL Take  500 mg by mouth every 6 (six) hours as needed (pain).   albuterol (2.5 MG/3ML) 0.083% nebulizer solution Commonly known as:  PROVENTIL Take 2.5 mg by nebulization every 6 (six) hours as needed for wheezing or shortness of breath.   albuterol 108 (90 Base) MCG/ACT inhaler Commonly known as:  PROVENTIL HFA;VENTOLIN HFA Inhale 2 puffs into the lungs every 4 (four) hours as needed for wheezing or shortness of breath.   allopurinol 300 MG tablet Commonly known as:  ZYLOPRIM Take 300 mg by mouth daily.   benzonatate 100 MG capsule Commonly known as:  TESSALON Take 1 capsule (100 mg total) by mouth every 8 (eight) hours.   CALCIUM-VITAMIN D PO Take 1 tablet by mouth 2 (two) times daily.   Fish Oil 1000 MG Caps Take 1,000 mg by mouth at bedtime.   furosemide 20 MG tablet Commonly known as:  LASIX Take 20 mg by mouth daily.   guaifenesin 100 MG/5ML syrup Commonly known as:  ROBITUSSIN Take 200 mg by mouth 3 (three) times daily as needed for cough.   hydrocortisone cream 1 % Apply to affected area 2 times daily What changed:  how much to take  how to take this  when to take this  reasons to take this  additional instructions   isosorbide mononitrate 30 MG 24 hr tablet Commonly known as:  IMDUR Take 30 mg by mouth daily.   metoprolol succinate 25 MG 24 hr tablet Commonly known as:  TOPROL-XL Take 25 mg by mouth daily.   nitroGLYCERIN 0.4 MG SL tablet Commonly known as:  NITROSTAT Place 0.4 mg under the tongue every 5 (five) minutes as needed for chest pain.   omeprazole 40 MG capsule Commonly known as:  PRILOSEC Take 1 capsule (40 mg total) by mouth 2 (two) times daily.   OXYGEN Inhale 2.5 L into the lungs continuous.   Rivaroxaban 15 MG Tabs tablet Commonly known as:  XARELTO Take 1 tablet (15 mg total) by mouth daily with supper.   spironolactone 25 MG tablet Commonly known as:  ALDACTONE Take 0.5 tablets (12.5 mg total) by mouth daily. Start taking on:   01/21/2016   vitamin B-12 1000 MCG tablet Commonly known as:  CYANOCOBALAMIN Take 1,000 mcg by mouth 2 (two) times daily.       Allergies  Allergen Reactions  . Aspirin Hives  . Penicillins Rash    Rash Has patient had a PCN reaction causing immediate rash, facial/tongue/throat swelling, SOB or lightheadedness with hypotension:YES Has patient had a PCN reaction causing severe rash involving mucus membranes or skin necrosis: NO Has patient had a PCN reaction that required hospitalization NO Has patient had a PCN reaction occurring within the last 10 years:NO If all of the above answers are "NO", then may proceed with Cephalosporin use.    Consultations:  cardiology   Procedures/Studies: Dg Chest 2 View  Result Date: 01/16/2016 CLINICAL DATA:  Central and left chest pain for the past 2 days. Ex-smoker. EXAM: CHEST  2 VIEW COMPARISON:  10/26/2015. FINDINGS: Mildly progressive enlargement of the cardiac silhouette. Stable post CABG changes and left subclavian AICD and pacemaker leads. A right subclavian pacemaker lead is also unchanged. Prominence of the interstitial markings and right mid lung zone scarring are unchanged. Small left pleural effusion without significant  change. Thoracolumbar spine degenerative changes. IMPRESSION: 1. Mildly progressive cardiomegaly. 2. Stable small left pleural effusion. 3. Stable interstitial fibrosis and right lung scarring. Electronically Signed   By: Claudie Revering M.D.   On: 01/16/2016 15:13   Ct Abdomen Pelvis W Contrast  Result Date: 01/18/2016 CLINICAL DATA:  Colon cancer.  History of gallstones and hematuria. EXAM: CT ABDOMEN AND PELVIS WITH CONTRAST TECHNIQUE: Multidetector CT imaging of the abdomen and pelvis was performed using the standard protocol following bolus administration of intravenous contrast. CONTRAST:  76m ISOVUE-300 IOPAMIDOL (ISOVUE-300) INJECTION 61% COMPARISON:  11/10/2014 FINDINGS: Lower chest: Small right greater than left  pleural effusions. Calcified pleural plaque on the left posteriorly. Stable cardiomegaly with right atrial, coronary sinus and right ventricular leads noted. Chronic rounded atelectasis at the left lung base posteriorly. Bibasilar dependent and subsegmental atelectasis. Hepatobiliary: Gallbladder is contracted. No gallstones are seen. No space-occupying mass of liver. No ductal dilatation. Pancreas: No pancreatic ductal dilatation or focal mass. Spleen: Normal Adrenals/Urinary Tract: Normal adrenal glands. Right lower pole nonobstructing 8.5 mm renal stone. Stable left lower pole 3 cm exophytic renal cyst with post contrast Hounsfield unit of 29 unchanged from prior. Mural nodular density suggested on prior exam is not as apparent on this exam. This exophytic cyst again demonstrates peripheral thin mural calcifications. Stomach/Bowel: The stomach is distended with contrast and fluid. There is no bowel obstruction or acute inflammatory process involving small nor large bowel. There is mild diffuse distention of small intestine approximately, nonspecific in etiology. Mild enteritis is not excluded. Circular muscle hypertrophy and sigmoid diverticulosis noted as before. Vascular/Lymphatic: Aortoiliac and branch vessel atherosclerosis without aneurysm or dissection. No lymphadenopathy. Reproductive: Radiation seeds noted of the normal size prostate. Other: No free air or free fluid. Musculoskeletal: Schmorl's node involving the superior endplate of L4 with degenerative disc disease L2-3 and L4-5. Multilevel osteophytes most prominent from T9 through T12. IMPRESSION: There are a few mildly distended fluid filled small bowel loops without evidence of acute inflammation or bowel obstruction. Mild ileus or enteritis could have this appearance of nonspecific. Sigmoid diverticulosis with circular muscle hypertrophy. Complex lower pole 3 cm exophytic cyst with thin peripheral calcifications suggesting a Bosniak category 2  renal cyst. Nonobstructing stable right lower pole 8.5 mm renal calculus. New small bilateral pleural effusions. Left-sided calcified pleural plaque consistent with prior asbestos exposure. Electronically Signed   By: DAshley RoyaltyM.D.   On: 01/18/2016 02:04        Discharge Exam: Vitals:   01/20/16 0425 01/20/16 1227  BP: 106/62 108/62  Pulse: 65 65  Resp: 18 18  Temp: 98.4 F (36.9 C) 98 F (36.7 C)   Vitals:   01/19/16 1100 01/19/16 1931 01/20/16 0425 01/20/16 1227  BP: 110/61 110/62 106/62 108/62  Pulse: 63 64 65 65  Resp: '16 17 18 18  '$ Temp: 97.8 F (36.6 C) 97.5 F (36.4 C) 98.4 F (36.9 C) 98 F (36.7 C)  TempSrc: Oral Oral Oral Oral  SpO2: 100% 100% 100% 100%  Weight:   56.5 kg (124 lb 8 oz)   Height:        General: Pt is alert, awake, not in acute distress Cardiovascular: RRR, S1/S2 +, no rubs, no gallops Respiratory: bibasilar crackles, no wheeze Abdominal: Soft, NT, ND, bowel sounds + Extremities: no edema, no cyanosis   The results of significant diagnostics from this hospitalization (including imaging, microbiology, ancillary and laboratory) are listed below for reference.    Significant Diagnostic Studies: Dg Chest 2  View  Result Date: 01/16/2016 CLINICAL DATA:  Central and left chest pain for the past 2 days. Ex-smoker. EXAM: CHEST  2 VIEW COMPARISON:  10/26/2015. FINDINGS: Mildly progressive enlargement of the cardiac silhouette. Stable post CABG changes and left subclavian AICD and pacemaker leads. A right subclavian pacemaker lead is also unchanged. Prominence of the interstitial markings and right mid lung zone scarring are unchanged. Small left pleural effusion without significant change. Thoracolumbar spine degenerative changes. IMPRESSION: 1. Mildly progressive cardiomegaly. 2. Stable small left pleural effusion. 3. Stable interstitial fibrosis and right lung scarring. Electronically Signed   By: Claudie Revering M.D.   On: 01/16/2016 15:13   Ct Abdomen  Pelvis W Contrast  Result Date: 01/18/2016 CLINICAL DATA:  Colon cancer.  History of gallstones and hematuria. EXAM: CT ABDOMEN AND PELVIS WITH CONTRAST TECHNIQUE: Multidetector CT imaging of the abdomen and pelvis was performed using the standard protocol following bolus administration of intravenous contrast. CONTRAST:  30m ISOVUE-300 IOPAMIDOL (ISOVUE-300) INJECTION 61% COMPARISON:  11/10/2014 FINDINGS: Lower chest: Small right greater than left pleural effusions. Calcified pleural plaque on the left posteriorly. Stable cardiomegaly with right atrial, coronary sinus and right ventricular leads noted. Chronic rounded atelectasis at the left lung base posteriorly. Bibasilar dependent and subsegmental atelectasis. Hepatobiliary: Gallbladder is contracted. No gallstones are seen. No space-occupying mass of liver. No ductal dilatation. Pancreas: No pancreatic ductal dilatation or focal mass. Spleen: Normal Adrenals/Urinary Tract: Normal adrenal glands. Right lower pole nonobstructing 8.5 mm renal stone. Stable left lower pole 3 cm exophytic renal cyst with post contrast Hounsfield unit of 29 unchanged from prior. Mural nodular density suggested on prior exam is not as apparent on this exam. This exophytic cyst again demonstrates peripheral thin mural calcifications. Stomach/Bowel: The stomach is distended with contrast and fluid. There is no bowel obstruction or acute inflammatory process involving small nor large bowel. There is mild diffuse distention of small intestine approximately, nonspecific in etiology. Mild enteritis is not excluded. Circular muscle hypertrophy and sigmoid diverticulosis noted as before. Vascular/Lymphatic: Aortoiliac and branch vessel atherosclerosis without aneurysm or dissection. No lymphadenopathy. Reproductive: Radiation seeds noted of the normal size prostate. Other: No free air or free fluid. Musculoskeletal: Schmorl's node involving the superior endplate of L4 with degenerative  disc disease L2-3 and L4-5. Multilevel osteophytes most prominent from T9 through T12. IMPRESSION: There are a few mildly distended fluid filled small bowel loops without evidence of acute inflammation or bowel obstruction. Mild ileus or enteritis could have this appearance of nonspecific. Sigmoid diverticulosis with circular muscle hypertrophy. Complex lower pole 3 cm exophytic cyst with thin peripheral calcifications suggesting a Bosniak category 2 renal cyst. Nonobstructing stable right lower pole 8.5 mm renal calculus. New small bilateral pleural effusions. Left-sided calcified pleural plaque consistent with prior asbestos exposure. Electronically Signed   By: DAshley RoyaltyM.D.   On: 01/18/2016 02:04     Microbiology: Recent Results (from the past 240 hour(s))  MRSA PCR Screening     Status: None   Collection Time: 01/17/16  3:34 AM  Result Value Ref Range Status   MRSA by PCR NEGATIVE NEGATIVE Final    Comment:        The GeneXpert MRSA Assay (FDA approved for NASAL specimens only), is one component of a comprehensive MRSA colonization surveillance program. It is not intended to diagnose MRSA infection nor to guide or monitor treatment for MRSA infections.      Labs: Basic Metabolic Panel:  Recent Labs Lab 01/16/16 1425 01/17/16 0323 01/18/16  1610 01/19/16 0304 01/20/16 0238  NA 133* 135 133* 133* 134*  K 4.7 4.5 4.4 4.1 4.1  CL 97* 95* 92* 94* 94*  CO2 '26 28 31 27 30  '$ GLUCOSE 106* 140* 92 88 119*  BUN 21* 27* 40* 37* 41*  CREATININE 1.31* 1.34* 1.59* 1.35* 1.41*  CALCIUM 9.2 9.0 9.0 8.8* 8.8*  MG  --   --   --  2.2  --    Liver Function Tests:  Recent Labs Lab 01/16/16 2203  AST 21  ALT 21  ALKPHOS 105  BILITOT 1.7*  PROT 7.5  ALBUMIN 3.4*   No results for input(s): LIPASE, AMYLASE in the last 168 hours. No results for input(s): AMMONIA in the last 168 hours. CBC:  Recent Labs Lab 01/17/16 0323 01/17/16 2220 01/18/16 0334 01/19/16 0304  01/20/16 0238  WBC 1.5* 3.6* 3.6* 3.3* 3.2*  HGB 7.7* 9.2* 9.9* 10.5* 10.6*  HCT 23.6* 27.9* 30.0* 31.9* 32.6*  MCV 97.5 94.6 94.9 95.2 96.7  PLT 130* 147* 146* 162 174   Cardiac Enzymes:  Recent Labs Lab 01/16/16 2203 01/17/16 0323 01/17/16 1107  TROPONINI <0.03 <0.03 <0.03   BNP: Invalid input(s): POCBNP CBG:  Recent Labs Lab 01/18/16 0554 01/18/16 1131 01/18/16 1636 01/18/16 2109 01/19/16 0658  GLUCAP 91 91 93 97 96    Time coordinating discharge:  Greater than 30 minutes  Signed:  Avangeline Stockburger, DO [facility] Pager: [phone] 01/20/2016, 3:03 PM

## 2016-01-21 ENCOUNTER — Encounter (HOSPITAL_COMMUNITY): Payer: Self-pay | Admitting: Vascular Surgery

## 2016-01-21 LAB — POCT I-STAT 3, ART BLOOD GAS (G3+)
ACID-BASE EXCESS: 3 mmol/L — AB (ref 0.0–2.0)
BICARBONATE: 27.7 mmol/L (ref 20.0–28.0)
O2 Saturation: 98 %
TCO2: 29 mmol/L (ref 0–100)
pCO2 arterial: 40.2 mmHg (ref 32.0–48.0)
pH, Arterial: 7.444 (ref 7.350–7.450)
pO2, Arterial: 92 mmHg (ref 83.0–108.0)

## 2016-01-21 LAB — GLUCOSE, CAPILLARY
GLUCOSE-CAPILLARY: 110 mg/dL — AB (ref 65–99)
GLUCOSE-CAPILLARY: 157 mg/dL — AB (ref 65–99)

## 2016-01-21 LAB — URINALYSIS, ROUTINE W REFLEX MICROSCOPIC
BILIRUBIN URINE: NEGATIVE
GLUCOSE, UA: NEGATIVE mg/dL
Ketones, ur: NEGATIVE mg/dL
Leukocytes, UA: NEGATIVE
NITRITE: NEGATIVE
PH: 6 (ref 5.0–8.0)
Protein, ur: NEGATIVE mg/dL
SPECIFIC GRAVITY, URINE: 1.012 (ref 1.005–1.030)

## 2016-01-21 LAB — COMPREHENSIVE METABOLIC PANEL
ALBUMIN: 3.3 g/dL — AB (ref 3.5–5.0)
ALT: 21 U/L (ref 17–63)
ANION GAP: 10 (ref 5–15)
AST: 20 U/L (ref 15–41)
Alkaline Phosphatase: 83 U/L (ref 38–126)
BILIRUBIN TOTAL: 1 mg/dL (ref 0.3–1.2)
BUN: 31 mg/dL — ABNORMAL HIGH (ref 6–20)
CHLORIDE: 99 mmol/L — AB (ref 101–111)
CO2: 25 mmol/L (ref 22–32)
Calcium: 8.4 mg/dL — ABNORMAL LOW (ref 8.9–10.3)
Creatinine, Ser: 1.15 mg/dL (ref 0.61–1.24)
GFR calc Af Amer: >60 mL/min (ref >60)
GFR calc non Af Amer: 58 mL/min — ABNORMAL LOW (ref >60)
GLUCOSE: 87 mg/dL (ref 65–99)
POTASSIUM: 4.1 mmol/L (ref 3.5–5.1)
SODIUM: 134 mmol/L — AB (ref 135–145)
TOTAL PROTEIN: 6.6 g/dL (ref 6.5–8.1)

## 2016-01-21 LAB — CBC
HCT: 28.1 % — ABNORMAL LOW (ref 39.0–52.0)
HEMOGLOBIN: 9.2 g/dL — AB (ref 13.0–17.0)
MCH: 31.1 pg (ref 26.0–34.0)
MCHC: 32.7 g/dL (ref 30.0–36.0)
MCV: 94.9 fL (ref 78.0–100.0)
PLATELETS: 147 10*3/uL — AB (ref 150–400)
RBC: 2.96 MIL/uL — ABNORMAL LOW (ref 4.22–5.81)
RDW: 17 % — ABNORMAL HIGH (ref 11.5–15.5)
WBC: 3.9 10*3/uL — ABNORMAL LOW (ref 4.0–10.5)

## 2016-01-21 LAB — URINE MICROSCOPIC-ADD ON: WBC UA: NONE SEEN WBC/hpf (ref 0–5)

## 2016-01-21 LAB — RAPID URINE DRUG SCREEN, HOSP PERFORMED
AMPHETAMINES: NOT DETECTED
BENZODIAZEPINES: NOT DETECTED
Barbiturates: NOT DETECTED
COCAINE: NOT DETECTED
OPIATES: POSITIVE — AB
TETRAHYDROCANNABINOL: NOT DETECTED

## 2016-01-21 LAB — APTT: APTT: 84 s — AB (ref 24–36)

## 2016-01-21 LAB — TROPONIN I: Troponin I: <0.03 ng/mL (ref <0.03)

## 2016-01-21 LAB — ETHANOL

## 2016-01-21 LAB — HEPARIN LEVEL (UNFRACTIONATED)
HEPARIN UNFRACTIONATED: 0.37 [IU]/mL (ref 0.30–0.70)
Heparin Unfractionated: 0.41 IU/mL (ref 0.30–0.70)

## 2016-01-21 LAB — PROTIME-INR
INR: 1.21
PROTHROMBIN TIME: 15.3 s — AB (ref 11.4–15.2)

## 2016-01-21 LAB — BRAIN NATRIURETIC PEPTIDE: B Natriuretic Peptide: 1167.5 pg/mL — ABNORMAL HIGH (ref 0.0–100.0)

## 2016-01-21 MED ORDER — ALBUTEROL SULFATE (2.5 MG/3ML) 0.083% IN NEBU: 2.5000 mg | INHALATION_SOLUTION | RESPIRATORY_TRACT | Status: DC | PRN

## 2016-01-21 MED ORDER — ORAL CARE MOUTH RINSE
15.0000 mL | Freq: Two times a day (BID) | OROMUCOSAL | Status: DC
  Administered 2016-01-21 – 2016-01-26 (×7): 15 mL via OROMUCOSAL

## 2016-01-21 MED ORDER — SPIRONOLACTONE 25 MG PO TABS
12.5000 mg | ORAL_TABLET | Freq: Every day | ORAL | Status: DC
  Administered 2016-01-21 – 2016-01-23 (×3): 12.5 mg via ORAL
  Filled 2016-01-21 (×3): qty 1

## 2016-01-21 MED ORDER — OXYCODONE-ACETAMINOPHEN 5-325 MG PO TABS
1.0000 | ORAL_TABLET | ORAL | Status: DC | PRN
  Administered 2016-01-21: 1 via ORAL
  Filled 2016-01-21: qty 1

## 2016-01-21 MED ORDER — FUROSEMIDE 20 MG PO TABS
20.0000 mg | ORAL_TABLET | Freq: Every day | ORAL | Status: DC
  Administered 2016-01-22 – 2016-01-23 (×2): 20 mg via ORAL
  Filled 2016-01-21 (×2): qty 1

## 2016-01-21 MED ORDER — MORPHINE SULFATE (PF) 2 MG/ML IV SOLN: 2.0000 mg | INTRAVENOUS | Status: DC | PRN

## 2016-01-21 MED ORDER — ACETAMINOPHEN 500 MG PO TABS
500.0000 mg | ORAL_TABLET | Freq: Four times a day (QID) | ORAL | Status: DC | PRN
  Administered 2016-01-22: 500 mg via ORAL
  Filled 2016-01-21 (×2): qty 1

## 2016-01-21 MED ORDER — ALLOPURINOL 300 MG PO TABS
300.0000 mg | ORAL_TABLET | Freq: Every day | ORAL | Status: DC
  Administered 2016-01-21 – 2016-01-26 (×6): 300 mg via ORAL
  Filled 2016-01-21 (×6): qty 1

## 2016-01-21 MED ORDER — FAMOTIDINE 20 MG PO TABS
20.0000 mg | ORAL_TABLET | Freq: Two times a day (BID) | ORAL | Status: DC
  Administered 2016-01-21 – 2016-01-22 (×2): 20 mg via ORAL
  Filled 2016-01-21 (×2): qty 1

## 2016-01-21 MED ORDER — OXYCODONE HCL 5 MG PO TABS
5.0000 mg | ORAL_TABLET | ORAL | Status: DC | PRN
  Administered 2016-01-21 – 2016-01-25 (×11): 5 mg via ORAL
  Filled 2016-01-21 (×11): qty 1

## 2016-01-21 MED ORDER — MORPHINE SULFATE (PF) 2 MG/ML IV SOLN
2.0000 mg | INTRAVENOUS | Status: DC | PRN
Start: 2016-01-21 — End: 2016-01-21

## 2016-01-21 NOTE — Progress Notes (Addendum)
[place] TEAM 1 - Stepdown/ICU TEAM  ARSLAN KIER  WGN:[id number] DOB: November 09, 1932 DOA: 01/20/2016 PCP: [name], MD    Brief Narrative:  82 y.o. male with history of systolic CHF, CAD s/pCABG, COPD on 2.5L [place] at baseline, atrial fibrillation, CKD 3, HTN, DM, PPM, and lung andprostate cancers who was discharged from the hospital 11/10 after presenting on 01/16/2016 with dyspnea, orthopnea, leg swelling and 7 lbs weight gain. CXR showedcardiomegaly, small left pleural effusion, elevatedBNP of 1720 and Troponin of 0.03, with withv-paced rhythm on EKG. His hemoglobin was also noted to have acutely dropped without any obvious source.  He was admitted for CHF exacerbation and symptomatic anemia, for which reason he was taken off Xarelto since 01/17/2016 due to suspected GI bleed.  He was treated in the hospital with diuresis. GI consultant saw him and felt nonoperative therapy was warranted and recommended 2 months of PPI therapy.  His overall medical condition improved and was discharged in stable condition with plans to re-start Xarelto that day. His daughter was driving him home and stopped by a pharmacy to pick up his prescriptions, and he complained of acute onset of right leg pain with some weakness and numbness.  In the ED he was started on heparin drip. Neurology was consulted for suspected stroke, and imaging studies which were negative. Color change to the affected foot was noted and Vascular Surgery was consulted. He was noted to have right lower extremity acute ischemia and taken to the OR emergently for thrombectomy.  Subjective: The patient is resting comfortably in bed.  He reports some pain in his right lower extremity.  He otherwise denies any complaints stating that he is not experiencing shortness of breath chest pain nausea vomiting fevers or chills.  He is hungry.  Assessment & Plan:  ACUTE CRITICAL RIGHT LIMB ISCHEMIA Status post emergent thrombectomy - ongoing care per  Vascular Surgery  Normocytic Anemia Recent presumed GI bleed plus operative blood loss - follow trend - transfuse as needed to keep hemoglobin 8.0 or >  DM2 Well-controlled at the present time - follow CBG - A1c 5.4% Aug 2017  CAD s/p CABG 2004 Asymptomatic  AFib s/p ACID -CHADS-Vasc score of at least 5 - anticoagulated with xarelto as outpt interrupted by bleeding   Chronic Systolic CHF TTE 7846 w/ EF 20-25% - EDW ~125lb (recent d/c wgt 124lb) - no ACEi/ARB due to CKD  Filed Weights   01/21/16 0500  Weight: 57.5 kg (126 lb 12.2 oz)    Chronic hypoxic respiratory failure COPD On 2.5 L/m oxygen at home  CKD Stage 3 Renal function stable at baseline currently  Recent Labs Lab 01/19/16 0304 01/20/16 0238 01/20/16 1735 01/20/16 1741 01/21/16 0500  CREATININE 1.35* 1.41* 1.35* 1.40* 1.15    HTN Blood pressure currently well controlled  DVT prophylaxis: IV heparin  Code Status: DNR - NO CODE Family Communication: no family present at time of exam  Disposition Plan: transfer to 2W tele bed - activity per Vasc Surg  Consultants:  Vasc Surgery   Procedures: 11/10 R LE thromboembolectomy   Antimicrobials:  none  Objective: Blood pressure 108/72, pulse 60, temperature 97.4 F (36.3 C), temperature source Oral, resp. rate 12, weight 57.5 kg (126 lb 12.2 oz), SpO2 100 %.  Intake/Output Summary (Last 24 hours) at 01/21/16 1006 Last data filed at 01/21/16 0837  Gross per 24 hour  Intake          1299.25 ml  Output  1225 ml  Net            74.25 ml   Filed Weights   01/21/16 0500  Weight: 57.5 kg (126 lb 12.2 oz)    Examination: General: No acute respiratory distress Lungs: Clear to auscultation bilaterally without wheezes or crackles Cardiovascular: Regular rate and rhythm without murmur gallop or rub normal S1 and S2 Abdomen: Nontender, nondistended, soft, bowel sounds positive, no rebound, no ascites, no appreciable mass Extremities: No  significant edema bilateral lower extremities  CBC:  Recent Labs Lab 01/18/16 0334 01/19/16 0304 01/20/16 0238 01/20/16 1735 01/20/16 1741 01/21/16 0500  WBC 3.6* 3.3* 3.2* 4.8  --  3.9*  NEUTROABS  --   --   --  2.1  --   --   HGB 9.9* 10.5* 10.6* 10.7* 11.6* 9.2*  HCT 30.0* 31.9* 32.6* 32.4* 34.0* 28.1*  MCV 94.9 95.2 96.7 95.9  --  94.9  PLT 146* 162 174 188  --  789*   Basic Metabolic Panel:  Recent Labs Lab 01/18/16 0334 01/19/16 0304 01/20/16 0238 01/20/16 1735 01/20/16 1741 01/21/16 0500  NA 133* 133* 134* 131* 132* 134*  K 4.4 4.1 4.1 4.9 4.8 4.1  CL 92* 94* 94* 95* 95* 99*  CO2 '31 27 30 24  '$ --  25  GLUCOSE 92 88 119* 176* 178* 87  BUN 40* 37* 41* 41* 53* 31*  CREATININE 1.59* 1.35* 1.41* 1.35* 1.40* 1.15  CALCIUM 9.0 8.8* 8.8* 8.7*  --  8.4*  MG  --  2.2  --   --   --   --    GFR: Estimated Creatinine Clearance: 41 mL/min (by C-G formula based on SCr of 1.15 mg/dL).  Liver Function Tests:  Recent Labs Lab 01/16/16 2203 01/20/16 1735 01/21/16 0500  AST 21 33 20  ALT '21 25 21  '$ ALKPHOS 105 112 83  BILITOT 1.7* 1.5* 1.0  PROT 7.5 7.1 6.6  ALBUMIN 3.4* 3.6 3.3*    Coagulation Profile:  Recent Labs Lab 01/20/16 1735 01/21/16 0500  INR 1.07 1.21    Cardiac Enzymes:  Recent Labs Lab 01/16/16 2203 01/17/16 0323 01/17/16 1107 01/21/16 0500  TROPONINI <0.03 <0.03 <0.03 <0.03    HbA1C: Hgb A1c MFr Bld  Date/Time Value Ref Range Status  10/27/2015 05:18 AM 5.4 4.8 - 5.6 % Final    Comment:    (NOTE)         Pre-diabetes: 5.7 - 6.4         Diabetes: >6.4         Glycemic control for adults with diabetes: <7.0   05/14/2011 09:16 PM 5.6 <5.7 % Final    Comment:    (NOTE)                                                                       According to the ADA Clinical Practice Recommendations for 2011, when HbA1c is used as a screening test:  >=6.5%   Diagnostic of Diabetes Mellitus           (if abnormal result is  confirmed) 5.7-6.4%   Increased risk of developing Diabetes Mellitus References:Diagnosis and Classification of Diabetes Mellitus,Diabetes FYBO,1751,02(HENID 1):S62-S69 and Standards of Medical Care in  Diabetes - 2011,Diabetes Care,2011,34 (Suppl 1):S11-S61.    CBG:  Recent Labs Lab 01/18/16 1636 01/18/16 2109 01/19/16 0658 01/20/16 1733 01/20/16 2313  GLUCAP 93 97 96 170* 147*    Recent Results (from the past 240 hour(s))  MRSA PCR Screening     Status: None   Collection Time: 01/17/16  3:34 AM  Result Value Ref Range Status   MRSA by PCR NEGATIVE NEGATIVE Final    Comment:        The GeneXpert MRSA Assay (FDA approved for NASAL specimens only), is one component of a comprehensive MRSA colonization surveillance program. It is not intended to diagnose MRSA infection nor to guide or monitor treatment for MRSA infections.      Scheduled Meds: . famotidine (PEPCID) IV  20 mg Intravenous Q12H  . furosemide  20 mg Intravenous Daily  . isosorbide mononitrate  30 mg Oral Daily  . mouth rinse  15 mL Mouth Rinse BID  . metoprolol succinate  25 mg Oral Daily  .  morphine injection  4 mg Intravenous Once   Continuous Infusions: . heparin 750 Units/hr (01/21/16 0200)  . lactated ringers 10 mL/hr at 01/21/16 0200     LOS: 1 day   Cherene Altes, MD [facility] Office  [phone] Pager - Text Page per Amion as per below:  On-Call/Text Page:      [email or web address]      password [email or web address]  If 7PM-7AM, please contact night-coverage [email or web address] Password [email or web address] 01/21/2016, 10:06 AM

## 2016-01-21 NOTE — Progress Notes (Signed)
Patient moved to 2w08. Tolerated move well with no distress noted. Family informed. No questions or concerns voiced during report and hand off. Will transfer care at this time.

## 2016-01-21 NOTE — Progress Notes (Signed)
ANTICOAGULATION CONSULT NOTE - Follow Up Consult  Pharmacy Consult for Heparin  Indication: S/P RLE thromboembolectomy  Allergies  Allergen Reactions  . Aspirin Hives  . Penicillins Rash    Rash Has patient had a PCN reaction causing immediate rash, facial/tongue/throat swelling, SOB or lightheadedness with hypotension:YES Has patient had a PCN reaction causing severe rash involving mucus membranes or skin necrosis: NO Has patient had a PCN reaction that required hospitalization NO Has patient had a PCN reaction occurring within the last 10 years:NO If all of the above answers are "NO", then may proceed with Cephalosporin use.     Vital Signs: Temp: 97.6 F (36.4 C) (11/11 0000) Temp Source: Oral (11/11 0000) BP: 119/83 (11/11 0500) Pulse Rate: 62 (11/11 0500)  Labs:  Recent Labs  01/20/16 0238 01/20/16 1735 01/20/16 1741 01/21/16 0500  HGB 10.6* 10.7* 11.6* 9.2*  HCT 32.6* 32.4* 34.0* 28.1*  PLT 174 188  --  147*  APTT  --  22*  --  84*  LABPROT  --  13.9  --   --   INR  --  1.07  --   --   HEPARINUNFRC  --   --   --  0.37  CREATININE 1.41* 1.35* 1.40*  --     Estimated Creatinine Clearance: 33.1 mL/min (by C-G formula based on SCr of 1.4 mg/dL (H)).  Assessment: 82 y/o M s/p thromboembolectomy, had been on Xarelto but was recently held, heparin level was therapeutic at 0.37 and aPTT was 84, can use heparin level only to dose. Hgb at 9.2 post-operatively>>monitor.   Goal of Therapy:  Heparin level 0.3-0.7 units/ml Monitor platelets by anticoagulation protocol: Yes   Plan:  -Cont heparin at 750 units/hr -1200 HL  Gaylon Melchor 01/21/2016,5:59 AM

## 2016-01-21 NOTE — Progress Notes (Signed)
  Progress Note    01/21/2016 9:17 AM 1 Day Post-Op  Subjective:  Only complains of incisional pain today  Vitals:   01/21/16 0700 01/21/16 0834  BP: 108/72   Pulse: 60   Resp: 12   Temp:  97.4 F (36.3 C)    Physical Exam: aaox3 Abdomen is soft R leg incision cdi Signal at AT level of ankle and foot appears perfused  CBC    Component Value Date/Time   WBC 3.9 (L) 01/21/2016 0500   RBC 2.96 (L) 01/21/2016 0500   HGB 9.2 (L) 01/21/2016 0500   HCT 28.1 (L) 01/21/2016 0500   PLT 147 (L) 01/21/2016 0500   MCV 94.9 01/21/2016 0500   MCH 31.1 01/21/2016 0500   MCHC 32.7 01/21/2016 0500   RDW 17.0 (H) 01/21/2016 0500   LYMPHSABS 2.2 01/20/2016 1735   MONOABS 0.5 01/20/2016 1735   EOSABS 0.1 01/20/2016 1735   BASOSABS 0.0 01/20/2016 1735    BMET    Component Value Date/Time   NA 134 (L) 01/21/2016 0500   K 4.1 01/21/2016 0500   CL 99 (L) 01/21/2016 0500   CO2 25 01/21/2016 0500   GLUCOSE 87 01/21/2016 0500   BUN 31 (H) 01/21/2016 0500   CREATININE 1.15 01/21/2016 0500   CREATININE 1.03 06/02/2013 0929   CALCIUM 8.4 (L) 01/21/2016 0500   GFRNONAA 58 (L) 01/21/2016 0500   GFRAA >60 01/21/2016 0500    INR    Component Value Date/Time   INR 1.21 01/21/2016 0500     Intake/Output Summary (Last 24 hours) at 01/21/16 0917 Last data filed at 01/21/16 0837  Gross per 24 hour  Intake          1299.25 ml  Output             1225 ml  Net            74.25 ml     Assessment:  82 y.o. male is s/p RLE thromboembolectomy, doing well this a.m.  Plan: Transfer to 2W on medical service Diet as tolerates Ok to ambulate from surgical standpoint Can restart xarelto tomorrow if hct stable  Tahj Njoku C. Donzetta Matters, MD [facility] Office: [phone] Pager: [phone]  01/21/2016 9:17 AM

## 2016-01-21 NOTE — Progress Notes (Signed)
ANTICOAGULATION CONSULT NOTE - Follow Up Consult  Pharmacy Consult for Heparin  Indication: S/P RLE thromboembolectomy   Assessment: 82 y/o M s/p thromboembolectomy, had been on Xarelto but was recently held, heparin level is therapeutic at 0.41. Hgb at 9.2 post-operatively>>monitor. No bleeding issues have been noted.  Goal of Therapy:  Heparin level 0.3-0.7 units/ml Monitor platelets by anticoagulation protocol: Yes   Plan:  -Cont heparin at 750 units/hr -Daily HL/CBC   Allergies  Allergen Reactions  . Aspirin Hives  . Penicillins Rash    Rash Has patient had a PCN reaction causing immediate rash, facial/tongue/throat swelling, SOB or lightheadedness with hypotension:YES Has patient had a PCN reaction causing severe rash involving mucus membranes or skin necrosis: NO Has patient had a PCN reaction that required hospitalization NO Has patient had a PCN reaction occurring within the last 10 years:NO If all of the above answers are "NO", then may proceed with Cephalosporin use.     Vital Signs: Temp: 97.7 F (36.5 C) (11/11 1141) Temp Source: Oral (11/11 1141) BP: 103/56 (11/11 1300) Pulse Rate: 64 (11/11 1300)  Labs:  Recent Labs  01/20/16 0238 01/20/16 1735 01/20/16 1741 01/21/16 0500 01/21/16 1300  HGB 10.6* 10.7* 11.6* 9.2*  --   HCT 32.6* 32.4* 34.0* 28.1*  --   PLT 174 188  --  147*  --   APTT  --  22*  --  84*  --   LABPROT  --  13.9  --  15.3*  --   INR  --  1.07  --  1.21  --   HEPARINUNFRC  --   --   --  0.37 0.41  CREATININE 1.41* 1.35* 1.40* 1.15  --   TROPONINI  --   --   --  <0.03 <0.03    Estimated Creatinine Clearance: 41 mL/min (by C-G formula based on SCr of 1.15 mg/dL).  Erin Hearing PharmD., BCPS Clinical Pharmacist Pager [phone] 01/21/2016 2:39 PM

## 2016-01-22 DIAGNOSIS — D631 Anemia in chronic kidney disease: Secondary | ICD-10-CM

## 2016-01-22 DIAGNOSIS — N183 Chronic kidney disease, stage 3 (moderate): Secondary | ICD-10-CM

## 2016-01-22 LAB — COMPREHENSIVE METABOLIC PANEL
ALBUMIN: 3.1 g/dL — AB (ref 3.5–5.0)
ALT: 20 U/L (ref 17–63)
ANION GAP: 9 (ref 5–15)
AST: 25 U/L (ref 15–41)
Alkaline Phosphatase: 74 U/L (ref 38–126)
BUN: 28 mg/dL — ABNORMAL HIGH (ref 6–20)
CHLORIDE: 98 mmol/L — AB (ref 101–111)
CO2: 26 mmol/L (ref 22–32)
Calcium: 8.6 mg/dL — ABNORMAL LOW (ref 8.9–10.3)
Creatinine, Ser: 1.48 mg/dL — ABNORMAL HIGH (ref 0.61–1.24)
GFR calc Af Amer: 49 mL/min — ABNORMAL LOW (ref >60)
GFR calc non Af Amer: 43 mL/min — ABNORMAL LOW (ref >60)
GLUCOSE: 124 mg/dL — AB (ref 65–99)
POTASSIUM: 4.8 mmol/L (ref 3.5–5.1)
SODIUM: 133 mmol/L — AB (ref 135–145)
Total Bilirubin: 1.4 mg/dL — ABNORMAL HIGH (ref 0.3–1.2)
Total Protein: 6.2 g/dL — ABNORMAL LOW (ref 6.5–8.1)

## 2016-01-22 LAB — CBC
HCT: 27.6 % — ABNORMAL LOW (ref 39.0–52.0)
HEMOGLOBIN: 9.1 g/dL — AB (ref 13.0–17.0)
MCH: 31.6 pg (ref 26.0–34.0)
MCHC: 33 g/dL (ref 30.0–36.0)
MCV: 95.8 fL (ref 78.0–100.0)
PLATELETS: 149 10*3/uL — AB (ref 150–400)
RBC: 2.88 MIL/uL — ABNORMAL LOW (ref 4.22–5.81)
RDW: 16.9 % — AB (ref 11.5–15.5)
WBC: 3.6 10*3/uL — ABNORMAL LOW (ref 4.0–10.5)

## 2016-01-22 LAB — GLUCOSE, CAPILLARY: Glucose-Capillary: 129 mg/dL — ABNORMAL HIGH (ref 65–99)

## 2016-01-22 LAB — HEPARIN LEVEL (UNFRACTIONATED): HEPARIN UNFRACTIONATED: 0.27 [IU]/mL — AB (ref 0.30–0.70)

## 2016-01-22 MED ORDER — FERROUS SULFATE 325 (65 FE) MG PO TABS
325.0000 mg | ORAL_TABLET | Freq: Two times a day (BID) | ORAL | Status: DC
  Administered 2016-01-22 – 2016-01-26 (×8): 325 mg via ORAL
  Filled 2016-01-22 (×8): qty 1

## 2016-01-22 MED ORDER — HEPARIN (PORCINE) IN NACL 100-0.45 UNIT/ML-% IJ SOLN: 850.0000 [IU]/h | INTRAMUSCULAR | Status: AC

## 2016-01-22 MED ORDER — PANTOPRAZOLE SODIUM 40 MG PO TBEC
40.0000 mg | DELAYED_RELEASE_TABLET | Freq: Two times a day (BID) | ORAL | Status: DC
  Administered 2016-01-22 – 2016-01-26 (×9): 40 mg via ORAL
  Filled 2016-01-22 (×9): qty 1

## 2016-01-22 MED ORDER — RIVAROXABAN 15 MG PO TABS: 15.0000 mg | ORAL_TABLET | Freq: Every day | ORAL | Status: DC

## 2016-01-22 MED ORDER — POLYETHYLENE GLYCOL 3350 17 G PO PACK
17.0000 g | PACK | Freq: Every day | ORAL | Status: DC
  Administered 2016-01-22: 17 g via ORAL
  Filled 2016-01-22 (×3): qty 1

## 2016-01-22 MED ORDER — HEPARIN BOLUS VIA INFUSION
850.0000 [IU] | Freq: Once | INTRAVENOUS | Status: AC
  Administered 2016-01-22: 850 [IU] via INTRAVENOUS
  Filled 2016-01-22: qty 850

## 2016-01-22 MED ORDER — RIVAROXABAN 15 MG PO TABS
15.0000 mg | ORAL_TABLET | Freq: Every day | ORAL | Status: DC
  Administered 2016-01-22: 15 mg via ORAL
  Filled 2016-01-22: qty 1

## 2016-01-22 NOTE — Progress Notes (Signed)
[name]  HEN:[id number] DOB: Feb 08, 1933 DOA: 01/20/2016 PCP: [name], MD    Brief Narrative:  82 y.o. male with history of systolic CHF, CAD s/pCABG, COPD on 2.5L [place] at baseline, atrial fibrillation, CKD 3, HTN, DM, PPM, and lung andprostate cancers who was discharged from the hospital 11/10 after presenting on 01/16/2016 with dyspnea, orthopnea, leg swelling and 7 lbs weight gain. CXR showedcardiomegaly, small left pleural effusion, elevatedBNP of 1720 and Troponin of 0.03, with withv-paced rhythm on EKG. His hemoglobin was also noted to have acutely dropped without any obvious source.  He was admitted for CHF exacerbation and symptomatic anemia, for which reason he was taken off Xarelto since 01/17/2016 due to suspected GI bleed.  He was treated in the hospital with diuresis. GI consultant saw him and felt nonoperative therapy was warranted and recommended 2 months of PPI therapy.  His overall medical condition improved and was discharged in stable condition with plans to re-start Xarelto that day. His daughter was driving him home and stopped by a pharmacy to pick up his prescriptions, and he complained of acute onset of right leg pain with some weakness and numbness.  In the ED he was started on heparin drip. Neurology was consulted for suspected stroke, and imaging studies which were negative. Color change to the affected foot was noted and Vascular Surgery was consulted. He was noted to have right lower extremity acute ischemia and taken to the OR emergently for thrombectomy.  Subjective: Patient is complaining of pain incision site.  Denies dyspnea.   Assessment & Plan:  ACUTE CRITICAL RIGHT LIMB ISCHEMIA Status post emergent thrombectomy - ongoing care per Vascular Surgery On IV heparin, transition to oral today.   Normocytic Anemia Recent presumed GI bleed plus operative blood loss - follow trend - transfuse as needed to keep hemoglobin 8.0 or > Hb stable at 9.    Discussed with Dr Amedeo Plenty, ok to resume xarelto. Start protonix. If hb drop or evidence of bleeding might need procedure.  Start ferrous sulfate.   DM2 Well-controlled at the present time - follow CBG - A1c 5.4% Aug 2017  CAD s/p CABG 2004 Asymptomatic  AFib s/p ACID -CHADS-Vasc score of at least 5 - anticoagulated with xarelto as outpt interrupted by bleeding   Chronic Systolic CHF TTE 3614 w/ EF 20-25% - EDW ~125lb (recent d/c wgt 124lb) - no ACEi/ARB due to CKD  Filed Weights   01/21/16 0500 01/21/16 1519 01/22/16 0328  Weight: 57.5 kg (126 lb 12.2 oz) 56.6 kg (124 lb 12.5 oz) 56.6 kg (124 lb 12.5 oz)   -Continue with spironolactone and lasix.   Chronic hypoxic respiratory failure COPD On 2.5 L/m oxygen at home  CKD Stage 3, 1.4---1.5 Renal function stable at baseline currently  Recent Labs Lab 01/20/16 0238 01/20/16 1735 01/20/16 1741 01/21/16 0500 01/22/16 0236  CREATININE 1.41* 1.35* 1.40* 1.15 1.48*   Stable. Monitor on lasix.   HTN Blood pressure currently well controlled  DVT prophylaxis: IV heparin  Code Status: DNR - NO CODE Family Communication:  Disposition Plan:   Consultants:  Vasc Surgery   Procedures: 11/10 R LE thromboembolectomy   Antimicrobials:  none  Objective: Blood pressure (!) 117/45, pulse 63, temperature 100.1 F (37.8 C), temperature source Oral, resp. rate 16, height '5\' 6"'$  (1.676 m), weight 56.6 kg (124 lb 12.5 oz), SpO2 100 %.  Intake/Output Summary (Last 24 hours) at 01/22/16 4315 Last data filed at 01/22/16 0329  Gross per 24 hour  Intake              215 ml  Output             2010 ml  Net            -1795 ml   Filed Weights   01/21/16 0500 01/21/16 1519 01/22/16 0328  Weight: 57.5 kg (126 lb 12.2 oz) 56.6 kg (124 lb 12.5 oz) 56.6 kg (124 lb 12.5 oz)    Examination: General: No acute respiratory distress Lungs: Clear to auscultation bilaterally without wheezes or crackles Cardiovascular: Regular rate and  rhythm without murmur gallop or rub normal S1 and S2 Abdomen: Nontender, nondistended, soft, bowel sounds positive, no rebound, no ascites, no appreciable mass Extremities: No significant edema bilateral lower extremities  CBC:  Recent Labs Lab 01/19/16 0304 01/20/16 0238 01/20/16 1735 01/20/16 1741 01/21/16 0500 01/22/16 0236  WBC 3.3* 3.2* 4.8  --  3.9* 3.6*  NEUTROABS  --   --  2.1  --   --   --   HGB 10.5* 10.6* 10.7* 11.6* 9.2* 9.1*  HCT 31.9* 32.6* 32.4* 34.0* 28.1* 27.6*  MCV 95.2 96.7 95.9  --  94.9 95.8  PLT 162 174 188  --  147* 638*   Basic Metabolic Panel:  Recent Labs Lab 01/19/16 0304 01/20/16 0238 01/20/16 1735 01/20/16 1741 01/21/16 0500 01/22/16 0236  NA 133* 134* 131* 132* 134* 133*  K 4.1 4.1 4.9 4.8 4.1 4.8  CL 94* 94* 95* 95* 99* 98*  CO2 '27 30 24  '$ --  25 26  GLUCOSE 88 119* 176* 178* 87 124*  BUN 37* 41* 41* 53* 31* 28*  CREATININE 1.35* 1.41* 1.35* 1.40* 1.15 1.48*  CALCIUM 8.8* 8.8* 8.7*  --  8.4* 8.6*  MG 2.2  --   --   --   --   --    GFR: Estimated Creatinine Clearance: 31.3 mL/min (by C-G formula based on SCr of 1.48 mg/dL (H)).  Liver Function Tests:  Recent Labs Lab 01/16/16 2203 01/20/16 1735 01/21/16 0500 01/22/16 0236  AST 21 33 20 25  ALT '21 25 21 20  '$ ALKPHOS 105 112 83 74  BILITOT 1.7* 1.5* 1.0 1.4*  PROT 7.5 7.1 6.6 6.2*  ALBUMIN 3.4* 3.6 3.3* 3.1*    Coagulation Profile:  Recent Labs Lab 01/20/16 1735 01/21/16 0500  INR 1.07 1.21    Cardiac Enzymes:  Recent Labs Lab 01/16/16 2203 01/17/16 0323 01/17/16 1107 01/21/16 0500 01/21/16 1300  TROPONINI <0.03 <0.03 <0.03 <0.03 <0.03    HbA1C: Hgb A1c MFr Bld  Date/Time Value Ref Range Status  10/27/2015 05:18 AM 5.4 4.8 - 5.6 % Final    Comment:    (NOTE)         Pre-diabetes: 5.7 - 6.4         Diabetes: >6.4         Glycemic control for adults with diabetes: <7.0   05/14/2011 09:16 PM 5.6 <5.7 % Final    Comment:    (NOTE)                                                                        According to the ADA Clinical Practice Recommendations for  2011, when HbA1c is used as a screening test:  >=6.5%   Diagnostic of Diabetes Mellitus           (if abnormal result is confirmed) 5.7-6.4%   Increased risk of developing Diabetes Mellitus References:Diagnosis and Classification of Diabetes Mellitus,Diabetes FWYO,3785,88(FOYDX 1):S62-S69 and Standards of Medical Care in         Diabetes - 2011,Diabetes AJOI,7867,67 (Suppl 1):S11-S61.    CBG:  Recent Labs Lab 01/20/16 1733 01/20/16 2313 01/21/16 1525 01/21/16 2030 01/22/16 0631  GLUCAP 170* 147* 110* 157* 129*    Recent Results (from the past 240 hour(s))  MRSA PCR Screening     Status: None   Collection Time: 01/17/16  3:34 AM  Result Value Ref Range Status   MRSA by PCR NEGATIVE NEGATIVE Final    Comment:        The GeneXpert MRSA Assay (FDA approved for NASAL specimens only), is one component of a comprehensive MRSA colonization surveillance program. It is not intended to diagnose MRSA infection nor to guide or monitor treatment for MRSA infections.      Scheduled Meds: . allopurinol  300 mg Oral Daily  . famotidine  20 mg Oral BID  . furosemide  20 mg Oral Daily  . heparin  850 Units Intravenous Once  . isosorbide mononitrate  30 mg Oral Daily  . mouth rinse  15 mL Mouth Rinse BID  . metoprolol succinate  25 mg Oral Daily  .  morphine injection  4 mg Intravenous Once  . spironolactone  12.5 mg Oral Daily   Continuous Infusions: . heparin    . lactated ringers 10 mL/hr at 01/21/16 1400     LOS: 2 days   Niel Hummer, MD [facility] [phone]  On-Call/Text Page:      [email or web address]      password [email or web address]  If 7PM-7AM, please contact night-coverage [email or web address] Password [email or web address] 01/22/2016, 8:22 AM

## 2016-01-22 NOTE — Progress Notes (Signed)
Patient lying in bed, pain med given. No other needs at this time. Call light within reach

## 2016-01-22 NOTE — Progress Notes (Signed)
  Progress Note    01/22/2016 11:05 AM 2 Days Post-Op  Subjective:  Incisional pain, otherwise doing well  Vitals:   01/22/16 0328 01/22/16 0915  BP: (!) 117/45 103/60  Pulse: 63 62  Resp:    Temp: 100.1 F (37.8 C)     Physical Exam: RLE incision with minimal hematoma R foot is warm with signal at AT  CBC    Component Value Date/Time   WBC 3.6 (L) 01/22/2016 0236   RBC 2.88 (L) 01/22/2016 0236   HGB 9.1 (L) 01/22/2016 0236   HCT 27.6 (L) 01/22/2016 0236   PLT 149 (L) 01/22/2016 0236   MCV 95.8 01/22/2016 0236   MCH 31.6 01/22/2016 0236   MCHC 33.0 01/22/2016 0236   RDW 16.9 (H) 01/22/2016 0236   LYMPHSABS 2.2 01/20/2016 1735   MONOABS 0.5 01/20/2016 1735   EOSABS 0.1 01/20/2016 1735   BASOSABS 0.0 01/20/2016 1735    BMET    Component Value Date/Time   NA 133 (L) 01/22/2016 0236   K 4.8 01/22/2016 0236   CL 98 (L) 01/22/2016 0236   CO2 26 01/22/2016 0236   GLUCOSE 124 (H) 01/22/2016 0236   BUN 28 (H) 01/22/2016 0236   CREATININE 1.48 (H) 01/22/2016 0236   CREATININE 1.03 06/02/2013 0929   CALCIUM 8.6 (L) 01/22/2016 0236   GFRNONAA 43 (L) 01/22/2016 0236   GFRAA 49 (L) 01/22/2016 0236    INR    Component Value Date/Time   INR 1.21 01/21/2016 0500     Intake/Output Summary (Last 24 hours) at 01/22/16 1105 Last data filed at 01/22/16 0820  Gross per 24 hour  Intake            272.5 ml  Output             1610 ml  Net          -1337.5 ml    Assessment:  82 y.o. male is pod#2 RLE thromboembolectomy, doing well this a.m.  Plan: Ok to ambulate from surgical standpoint Can restart xarelto per primary Continue to follow   Mattye Verdone C. Donzetta Matters, MD [facility] [place] Office: [phone] Pager: [phone]  01/22/2016 11:05 AM

## 2016-01-22 NOTE — Progress Notes (Addendum)
ANTICOAGULATION CONSULT NOTE - Follow Up Consult  Pharmacy Consult for Heparin and Xarelto Indication: atrial fibrillation and S/P RLE thromboembolectomy   Assessment: 82 y/o M s/p thromboembolectomy, had been on Xarelto but was recently held -Heparin level is subtherapeutic at 0.27 this AM. Physician plan to transition to Xarelto today. Heparin continued until Xarelto started later this evening. -Hgb and Plts stable. No bleeding issues have been noted.  Goal of Therapy:  Heparin level 0.3-0.7 units/ml Monitor platelets by anticoagulation protocol: Yes   Plan:  -Bolus heparin 850 units x1 -Inc heparin at 850 units/hr -8 hour level at 1600 cancelled since will be transitioning to Xarelto at 1700 -Daily HL/CBC cancelled -Xarelto '15mg'$  with supper daily starting at 1700 this evening -Monitor CBC as needed   Allergies  Allergen Reactions  . Aspirin Hives  . Penicillins Rash    Rash Has patient had a PCN reaction causing immediate rash, facial/tongue/throat swelling, SOB or lightheadedness with hypotension:YES Has patient had a PCN reaction causing severe rash involving mucus membranes or skin necrosis: NO Has patient had a PCN reaction that required hospitalization NO Has patient had a PCN reaction occurring within the last 10 years:NO If all of the above answers are "NO", then may proceed with Cephalosporin use.     Vital Signs: Temp: 100.1 F (37.8 C) (11/12 0328) Temp Source: Oral (11/12 0328) BP: 117/45 (11/12 0328) Pulse Rate: 63 (11/12 0328)  Labs:  Recent Labs  01/20/16 1735 01/20/16 1741 01/21/16 0500 01/21/16 1300 01/22/16 0236  HGB 10.7* 11.6* 9.2*  --  9.1*  HCT 32.4* 34.0* 28.1*  --  27.6*  PLT 188  --  147*  --  149*  APTT 22*  --  84*  --   --   LABPROT 13.9  --  15.3*  --   --   INR 1.07  --  1.21  --   --   HEPARINUNFRC  --   --  0.37 0.41 0.27*  CREATININE 1.35* 1.40* 1.15  --  1.48*  TROPONINI  --   --  <0.03 <0.03  --     Estimated  Creatinine Clearance: 31.3 mL/min (by C-G formula based on SCr of 1.48 mg/dL (H)).  Myer Peer Grayland Ormond), PharmD  PGY1 Pharmacy Resident Pager: [phone] 01/22/2016 9:56 AM

## 2016-01-23 LAB — BASIC METABOLIC PANEL
Anion gap: 10 (ref 5–15)
BUN: 31 mg/dL — AB (ref 6–20)
CALCIUM: 8.3 mg/dL — AB (ref 8.9–10.3)
CHLORIDE: 99 mmol/L — AB (ref 101–111)
CO2: 24 mmol/L (ref 22–32)
CREATININE: 1.69 mg/dL — AB (ref 0.61–1.24)
GFR, EST AFRICAN AMERICAN: 42 mL/min — AB (ref >60)
GFR, EST NON AFRICAN AMERICAN: 36 mL/min — AB (ref >60)
Glucose, Bld: 142 mg/dL — ABNORMAL HIGH (ref 65–99)
Potassium: 4.2 mmol/L (ref 3.5–5.1)
SODIUM: 133 mmol/L — AB (ref 135–145)

## 2016-01-23 LAB — CBC
HCT: 23.9 % — ABNORMAL LOW (ref 39.0–52.0)
HEMOGLOBIN: 8 g/dL — AB (ref 13.0–17.0)
MCH: 32.3 pg (ref 26.0–34.0)
MCHC: 33.5 g/dL (ref 30.0–36.0)
MCV: 96.4 fL (ref 78.0–100.0)
PLATELETS: 131 10*3/uL — AB (ref 150–400)
RBC: 2.48 MIL/uL — ABNORMAL LOW (ref 4.22–5.81)
RDW: 17.4 % — ABNORMAL HIGH (ref 11.5–15.5)
WBC: 4 10*3/uL (ref 4.0–10.5)

## 2016-01-23 MED ORDER — APIXABAN 5 MG PO TABS
5.0000 mg | ORAL_TABLET | Freq: Two times a day (BID) | ORAL | Status: DC
Start: 2016-01-30 — End: 2016-01-26

## 2016-01-23 MED ORDER — APIXABAN 5 MG PO TABS
10.0000 mg | ORAL_TABLET | Freq: Two times a day (BID) | ORAL | Status: DC
  Administered 2016-01-23 – 2016-01-26 (×6): 10 mg via ORAL
  Filled 2016-01-23: qty 4
  Filled 2016-01-23 (×5): qty 2

## 2016-01-23 NOTE — Progress Notes (Signed)
[name]  XHB:[id number] DOB: 1932/10/06 DOA: 01/20/2016 PCP: [name], MD    Brief Narrative:  82 y.o. male with history of systolic CHF, CAD s/pCABG, COPD on 2.5L [place] at baseline, atrial fibrillation, CKD 3, HTN, DM, PPM, and lung andprostate cancers who was discharged from the hospital 11/10 after presenting on 01/16/2016 with dyspnea, orthopnea, leg swelling and 7 lbs weight gain. CXR showedcardiomegaly, small left pleural effusion, elevatedBNP of 1720 and Troponin of 0.03, with withv-paced rhythm on EKG. His hemoglobin was also noted to have acutely dropped without any obvious source.  He was admitted for CHF exacerbation and symptomatic anemia, for which reason he was taken off Xarelto since 01/17/2016 due to suspected GI bleed.  He was treated in the hospital with diuresis. GI consultant saw him and felt nonoperative therapy was warranted and recommended 2 months of PPI therapy.  His overall medical condition improved and was discharged in stable condition with plans to re-start Xarelto that day. His daughter was driving him home and stopped by a pharmacy to pick up his prescriptions, and he complained of acute onset of right leg pain with some weakness and numbness.  In the ED he was started on heparin drip. Neurology was consulted for suspected stroke, and imaging studies which were negative. Color change to the affected foot was noted and Vascular Surgery was consulted. He was noted to have right lower extremity acute ischemia and taken to the OR emergently for thrombectomy.  Subjective: Mild pain incision site , leg  Denies dyspnea.   Assessment & Plan:  ACUTE CRITICAL RIGHT LIMB ISCHEMIA Status post emergent thrombectomy - ongoing care per Vascular Surgery On IV heparin, transition to xarelto 11-12 Will ask pharmacy to adjust dose for renal function.   Normocytic Anemia Recent presumed GI bleed plus operative blood loss - follow trend - transfuse as needed to keep  hemoglobin 8.0 or > Discussed with Dr Amedeo Plenty, ok to resume xarelto. Start protonix. If hb drop or evidence of bleeding might need procedure.  Started  ferrous sulfate 11-12.  Hb decrease to 8, repeat labs in am. No evidence of active bleeding. If Hb continue to decrease will need to consult GI./  DM2 Well-controlled at the present time - follow CBG - A1c 5.4% Aug 2017  CAD s/p CABG 2004 Asymptomatic  AFib s/p ACID -CHADS-Vasc score of at least 5 - anticoagulated with xarelto as outpt interrupted by bleeding   Chronic Systolic CHF TTE 8101 w/ EF 20-25% - EDW ~125lb (recent d/c wgt 124lb) - no ACEi/ARB due to CKD  Filed Weights   01/21/16 1519 01/22/16 0328 01/23/16 0423  Weight: 56.6 kg (124 lb 12.5 oz) 56.6 kg (124 lb 12.5 oz) 56.4 kg (124 lb 5.4 oz)   -Hold spironolactone and lasix, cr increased to 1.6.   Chronic hypoxic respiratory failure COPD On 2.5 L/m oxygen at home  CKD Stage 3, 1.4---1.5 Cr worse , repeat in am.   Recent Labs Lab 01/20/16 1735 01/20/16 1741 01/21/16 0500 01/22/16 0236 01/23/16 1016  CREATININE 1.35* 1.40* 1.15 1.48* 1.69*   Stable. Monitor on lasix.   HTN Blood pressure currently well controlled  DVT prophylaxis: IV heparin  Code Status: DNR - NO CODE Family Communication:  Disposition Plan:   Consultants:  Vasc Surgery   Procedures: 11/10 R LE thromboembolectomy   Antimicrobials:  none  Objective: Blood pressure (!) 106/53, pulse 63, temperature 98.7 F (37.1 C), temperature source Oral, resp. rate 18, height '5\' 6"'$  (1.676  m), weight 56.4 kg (124 lb 5.4 oz), SpO2 100 %.  Intake/Output Summary (Last 24 hours) at 01/23/16 1339 Last data filed at 01/23/16 1300  Gross per 24 hour  Intake              720 ml  Output              801 ml  Net              -81 ml   Filed Weights   01/21/16 1519 01/22/16 0328 01/23/16 0423  Weight: 56.6 kg (124 lb 12.5 oz) 56.6 kg (124 lb 12.5 oz) 56.4 kg (124 lb 5.4 oz)     Examination: General: No acute respiratory distress Lungs: Clear to auscultation bilaterally without wheezes or crackles Cardiovascular: Regular rate and rhythm without murmur gallop or rub normal S1 and S2 Abdomen: Nontender, nondistended, soft, bowel sounds positive, no rebound, no ascites, no appreciable mass Extremities: No significant edema bilateral lower extremities, incision right leg, without redness, healing.   CBC:  Recent Labs Lab 01/20/16 0238 01/20/16 1735 01/20/16 1741 01/21/16 0500 01/22/16 0236 01/23/16 1016  WBC 3.2* 4.8  --  3.9* 3.6* 4.0  NEUTROABS  --  2.1  --   --   --   --   HGB 10.6* 10.7* 11.6* 9.2* 9.1* 8.0*  HCT 32.6* 32.4* 34.0* 28.1* 27.6* 23.9*  MCV 96.7 95.9  --  94.9 95.8 96.4  PLT 174 188  --  147* 149* 962*   Basic Metabolic Panel:  Recent Labs Lab 01/19/16 0304 01/20/16 0238 01/20/16 1735 01/20/16 1741 01/21/16 0500 01/22/16 0236 01/23/16 1016  NA 133* 134* 131* 132* 134* 133* 133*  K 4.1 4.1 4.9 4.8 4.1 4.8 4.2  CL 94* 94* 95* 95* 99* 98* 99*  CO2 '27 30 24  '$ --  '25 26 24  '$ GLUCOSE 88 119* 176* 178* 87 124* 142*  BUN 37* 41* 41* 53* 31* 28* 31*  CREATININE 1.35* 1.41* 1.35* 1.40* 1.15 1.48* 1.69*  CALCIUM 8.8* 8.8* 8.7*  --  8.4* 8.6* 8.3*  MG 2.2  --   --   --   --   --   --    GFR: Estimated Creatinine Clearance: 27.3 mL/min (by C-G formula based on SCr of 1.69 mg/dL (H)).  Liver Function Tests:  Recent Labs Lab 01/16/16 2203 01/20/16 1735 01/21/16 0500 01/22/16 0236  AST 21 33 20 25  ALT '21 25 21 20  '$ ALKPHOS 105 112 83 74  BILITOT 1.7* 1.5* 1.0 1.4*  PROT 7.5 7.1 6.6 6.2*  ALBUMIN 3.4* 3.6 3.3* 3.1*    Coagulation Profile:  Recent Labs Lab 01/20/16 1735 01/21/16 0500  INR 1.07 1.21    Cardiac Enzymes:  Recent Labs Lab 01/16/16 2203 01/17/16 0323 01/17/16 1107 01/21/16 0500 01/21/16 1300  TROPONINI <0.03 <0.03 <0.03 <0.03 <0.03    HbA1C: Hgb A1c MFr Bld  Date/Time Value Ref Range Status   10/27/2015 05:18 AM 5.4 4.8 - 5.6 % Final    Comment:    (NOTE)         Pre-diabetes: 5.7 - 6.4         Diabetes: >6.4         Glycemic control for adults with diabetes: <7.0   05/14/2011 09:16 PM 5.6 <5.7 % Final    Comment:    (NOTE)  According to the ADA Clinical Practice Recommendations for 2011, when HbA1c is used as a screening test:  >=6.5%   Diagnostic of Diabetes Mellitus           (if abnormal result is confirmed) 5.7-6.4%   Increased risk of developing Diabetes Mellitus References:Diagnosis and Classification of Diabetes Mellitus,Diabetes OFVW,8677,37(VGKKD 1):S62-S69 and Standards of Medical Care in         Diabetes - 2011,Diabetes PTEL,0761,51 (Suppl 1):S11-S61.    CBG:  Recent Labs Lab 01/20/16 1733 01/20/16 2313 01/21/16 1525 01/21/16 2030 01/22/16 0631  GLUCAP 170* 147* 110* 157* 129*    Recent Results (from the past 240 hour(s))  MRSA PCR Screening     Status: None   Collection Time: 01/17/16  3:34 AM  Result Value Ref Range Status   MRSA by PCR NEGATIVE NEGATIVE Final    Comment:        The GeneXpert MRSA Assay (FDA approved for NASAL specimens only), is one component of a comprehensive MRSA colonization surveillance program. It is not intended to diagnose MRSA infection nor to guide or monitor treatment for MRSA infections.      Scheduled Meds: . allopurinol  300 mg Oral Daily  . ferrous sulfate  325 mg Oral BID WC  . isosorbide mononitrate  30 mg Oral Daily  . mouth rinse  15 mL Mouth Rinse BID  . metoprolol succinate  25 mg Oral Daily  .  morphine injection  4 mg Intravenous Once  . pantoprazole  40 mg Oral BID  . polyethylene glycol  17 g Oral Daily  . rivaroxaban  15 mg Oral Q supper   Continuous Infusions: . lactated ringers 10 mL/hr at 01/21/16 1400     LOS: 3 days   Niel Hummer, MD [facility] [phone]  On-Call/Text Page:       [email or web address]      password [email or web address]  If 7PM-7AM, please contact night-coverage [email or web address] Password [email or web address] 01/23/2016, 1:39 PM

## 2016-01-23 NOTE — Progress Notes (Addendum)
[facility] [place]  Subjective  - Doing well, minimal pain at the incision site.   Objective 108/60 63 98.6 F (37 C) (Oral) 16 95%  Intake/Output Summary (Last 24 hours) at 01/23/16 0717 Last data filed at 01/23/16 0423  Gross per 24 hour  Intake           769.13 ml  Output              750 ml  Net            19.13 ml    Doppler DP biphasic, PT biphasic  LE incision healing well, soft Heart paced rhythm   Assessment/Planning: POD # 3 RLE thromboembolectomy, doing well this a.m.  Encourage ambulation Xarelto restatred  F/U in our office  2 weeks after discharge.  Patient lives with daughter.  Laurence Slate [facility] 01/23/2016 7:17 AM --  Laboratory Lab Results:  Recent Labs  01/21/16 0500 01/22/16 0236  WBC 3.9* 3.6*  HGB 9.2* 9.1*  HCT 28.1* 27.6*  PLT 147* 149*   BMET  Recent Labs  01/21/16 0500 01/22/16 0236  NA 134* 133*  K 4.1 4.8  CL 99* 98*  CO2 25 26  GLUCOSE 87 124*  BUN 31* 28*  CREATININE 1.15 1.48*  CALCIUM 8.4* 8.6*    COAG Lab Results  Component Value Date   INR 1.21 01/21/2016   INR 1.07 01/20/2016   INR 2.50 (H) 12/16/2012   No results found for: PTT  I have independently interviewed patient and agree with PA assessment and plan above.   Maylee Bare C. Donzetta Matters, MD [facility] [place] Office: [phone] Pager: [phone]

## 2016-01-23 NOTE — Consult Note (Signed)
   [facility] CM Inpatient Consult   01/23/2016  [name] 1933-02-27 [id number]    Patient screened for [place] Management services for recent readmission. Went to bedside to offer and explain [facility] Care Management program with Mr. Wesche. Discussed his recent readmission. States he was having extreme pain in his right leg. States his daughter lives with him and takes him to his MD appointments. He also denies having any concerns with taking or obtaining his medications.   Explained [place] Management program. He states " my daughter handles all of that but she is at work right now". When asked if writer could contact his daughter to discuss program, Mr. Grable pleasantly declined by stating " just leave the information she could look over it and call if interested". Mr. Castorena states his daughter would be the one who would have to be contacted for transition of care calls anyway.   Will make inpatient RNCM aware that Mr. Brandt did not want to accept [place] Management services at this time.  Marthenia Rolling, MSN-Ed, RN,BSN [facility] Liaison [phone], [place], RN,BSN [facility] Liaison [phone]

## 2016-01-23 NOTE — Discharge Instructions (Addendum)
Information on my medicine - ELIQUIS (apixaban)  This medication education was reviewed with me or my healthcare representative as part of my discharge preparation.  Why was Eliquis prescribed for you? Eliquis was prescribed to treat blood clots that may have been found in the veins of your legs (deep vein thrombosis) or in your lungs (pulmonary embolism) and to reduce the risk of them occurring again.  What do You need to know about Eliquis ? The starting dose is 10 mg (two 5 mg tablets) taken TWICE daily for the FIRST SEVEN (7) DAYS, then on (enter date)  01/30/2016  the dose is reduced to ONE 5 mg tablet taken TWICE daily.  Eliquis may be taken with or without food.   Try to take the dose about the same time in the morning and in the evening. If you have difficulty swallowing the tablet whole please discuss with your pharmacist how to take the medication safely.  Take Eliquis exactly as prescribed and DO NOT stop taking Eliquis without talking to the doctor who prescribed the medication.  Stopping may increase your risk of developing a new blood clot.  Refill your prescription before you run out.  After discharge, you should have regular check-up appointments with your healthcare provider that is prescribing your Eliquis.    What do you do if you miss a dose? If a dose of ELIQUIS is not taken at the scheduled time, take it as soon as possible on the same day and twice-daily administration should be resumed. The dose should not be doubled to make up for a missed dose.  Important Safety Information A possible side effect of Eliquis is bleeding. You should call your healthcare provider right away if you experience any of the following: ? Bleeding from an injury or your nose that does not stop. ? Unusual colored urine (red or dark brown) or unusual colored stools (red or black). ? Unusual bruising for unknown reasons. ? A serious fall or if you hit your head (even if there is no  bleeding).  Some medicines may interact with Eliquis and might increase your risk of bleeding or clotting while on Eliquis. To help avoid this, consult your healthcare provider or pharmacist prior to using any new prescription or non-prescription medications, including herbals, vitamins, non-steroidal anti-inflammatory drugs (NSAIDs) and supplements.  This website has more information on Eliquis (apixaban): http://www.eliquis.com/eliquis/home  Take Eliquis 10 mg twice daily for 3 more days, and on Monday, 01/30/2016 take 5 mg twice daily

## 2016-01-23 NOTE — Progress Notes (Signed)
Pharmacist Heart Failure Core Measure Documentation  Assessment: [name] has an EF documented as 10-15% on 01/18/2016 by Diamond Nickel.  Rationale: Heart failure patients with left ventricular systolic dysfunction (LVSD) and an EF < 40% should be prescribed an angiotensin converting enzyme inhibitor (ACEI) or angiotensin receptor blocker (ARB) at discharge unless a contraindication is documented in the medical record.  This patient is not currently on an ACEI or ARB for HF.  This note is being placed in the record in order to provide documentation that a contraindication to the use of these agents is present for this encounter.  ACE Inhibitor or Angiotensin Receptor Blocker is contraindicated (specify all that apply)  '[]'$   ACEI allergy AND ARB allergy '[]'$   Angioedema '[]'$   Moderate or severe aortic stenosis '[]'$   Hyperkalemia '[]'$   Hypotension '[]'$   Renal artery stenosis '[x]'$   Worsening renal function, preexisting renal disease or dysfunction    Elicia Lamp, PharmD, BCPS Clinical Pharmacist 01/23/2016 3:44 PM

## 2016-01-23 NOTE — Progress Notes (Addendum)
[place] for Apixaban Indication: atrial fibrillation, acute clot  Assessment: 83 yom on Xarelto '15mg'$  PTA for afib, briefly held with GIB. Pt then had acute critical R limb ischemia and is s/p RLE thromboembolectomy on 11/11. Xarelto was resumed on 11/12 after ok'd per GI with recent GIB issues, but per discussion with Dr. Donzetta Matters, patient needs to be treated for acute clot. Renal function worsening, SCr up 1.69, CrCl~27. Hg trend down 8, plt down 131, no acute bleed documented. Xarelto not recommended for use with CrCl<30 for VTE indication. Pharmacy consulted to transition to apixaban. Noted advanced age and low weight, but no dosage reduction recommended with apixaban for VTE indication, although patients with CrCl<25 were not included in trials.  Last dose of Xarelto given 11/12 at 1319.  Goal of Therapy:  VTE treatment, stroke/VTE prevention Monitor platelets by anticoagulation protocol: Yes   Plan:  D/c rivaroxaban Start apixaban '10mg'$  PO BID x 7 days; then '5mg'$  PO BID Monitor CBC, s/sx bleeding closely   Elicia Lamp, PharmD, BCPS Clinical Pharmacist 01/23/2016 1:56 PM

## 2016-01-24 LAB — CBC
HEMATOCRIT: 23.4 % — AB (ref 39.0–52.0)
Hemoglobin: 7.8 g/dL — ABNORMAL LOW (ref 13.0–17.0)
MCH: 32 pg (ref 26.0–34.0)
MCHC: 33.3 g/dL (ref 30.0–36.0)
MCV: 95.9 fL (ref 78.0–100.0)
PLATELETS: 129 10*3/uL — AB (ref 150–400)
RBC: 2.44 MIL/uL — ABNORMAL LOW (ref 4.22–5.81)
RDW: 17.3 % — AB (ref 11.5–15.5)
WBC: 4.9 10*3/uL (ref 4.0–10.5)

## 2016-01-24 LAB — BASIC METABOLIC PANEL
ANION GAP: 9 (ref 5–15)
BUN: 31 mg/dL — AB (ref 6–20)
CALCIUM: 8.2 mg/dL — AB (ref 8.9–10.3)
CO2: 24 mmol/L (ref 22–32)
CREATININE: 1.44 mg/dL — AB (ref 0.61–1.24)
Chloride: 98 mmol/L — ABNORMAL LOW (ref 101–111)
GFR calc Af Amer: 51 mL/min — ABNORMAL LOW (ref >60)
GFR, EST NON AFRICAN AMERICAN: 44 mL/min — AB (ref >60)
GLUCOSE: 94 mg/dL (ref 65–99)
Potassium: 4.4 mmol/L (ref 3.5–5.1)
Sodium: 131 mmol/L — ABNORMAL LOW (ref 135–145)

## 2016-01-24 LAB — HEMOGLOBIN AND HEMATOCRIT, BLOOD
HCT: 26.2 % — ABNORMAL LOW (ref 39.0–52.0)
Hemoglobin: 8.5 g/dL — ABNORMAL LOW (ref 13.0–17.0)

## 2016-01-24 LAB — URIC ACID: URIC ACID, SERUM: 4.4 mg/dL (ref 4.4–7.6)

## 2016-01-24 MED ORDER — FUROSEMIDE 20 MG PO TABS
20.0000 mg | ORAL_TABLET | Freq: Every day | ORAL | Status: DC
  Administered 2016-01-24 – 2016-01-26 (×3): 20 mg via ORAL
  Filled 2016-01-24 (×3): qty 1

## 2016-01-24 NOTE — Evaluation (Signed)
Physical Therapy Evaluation Patient Details Name: [name] MRN: [id number] DOB: 1932/08/08 Today's Date: 01/24/2016   History of Present Illness  Pt is an 82 y/o male s/p RLE thromboembolectomy 01/20/16  Clinical Impression  Pt presents with impairments in gait, balance, mobility and endurance and will benefit from skilled PT to address deficits and improve functional mobility.    Follow Up Recommendations Home health PT;Supervision for mobility/OOB    Equipment Recommendations  None recommended by PT    Recommendations for Other Services       Precautions / Restrictions Precautions Precautions: Fall Restrictions Weight Bearing Restrictions: No      Mobility  Bed Mobility                  Transfers   Equipment used: Rolling walker (2 wheeled) Transfers: Sit to/from Stand Sit to Stand: Min guard         General transfer comment: pt requires steadying assistance for initial sit to stand due to posterior LOB  Ambulation/Gait Ambulation/Gait assistance: Min guard Ambulation Distance (Feet): 25 Feet Assistive device: Rolling walker (2 wheeled)       General Gait Details: Pt requires cues for safety with RW. pt with decreased cadence, decreased foot clearance  Stairs            Wheelchair Mobility    Modified Rankin (Stroke Patients Only)       Balance               Standing balance comment: able to stand statically with RW with 1 UE support with supervision                             Pertinent Vitals/Pain Pain Assessment: No/denies pain    Home Living Family/patient expects to be discharged to:: Private residence Living Arrangements: Children Available Help at Discharge: Family;Available PRN/intermittently Type of Home: House Home Access: Stairs to enter     Home Layout: One level Home Equipment: Environmental consultant - 2 wheels;Walker - 4 wheels;Hospital bed;Other (comment) (oxygen)      Prior Function Level of  Independence: Independent with assistive device(s)         Comments: pt states he has RW to use around the house and sleeps in a hospital bed, uses O2 at home     Hand Dominance        Extremity/Trunk Assessment   Upper Extremity Assessment: Generalized weakness           Lower Extremity Assessment: Generalized weakness      Cervical / Trunk Assessment: Kyphotic  Communication   Communication: HOH  Cognition Arousal/Alertness: Awake/alert Behavior During Therapy: WFL for tasks assessed/performed             Safety/Judgement: Decreased awareness of safety;Decreased awareness of deficits          General Comments General comments (skin integrity, edema, etc.): spO2 85% after short distance gait on 3LO2, improved to 93% after 30 seconds seated rest with cues for deep breathing    Exercises     Assessment/Plan    PT Assessment Patient needs continued PT services  PT Problem List Decreased strength;Decreased activity tolerance;Decreased balance;Decreased mobility;Decreased coordination;Decreased safety awareness;Cardiopulmonary status limiting activity          PT Treatment Interventions DME instruction;Gait training;Stair training;Functional mobility training;Therapeutic activities;Therapeutic exercise;Balance training;Neuromuscular re-education;Patient/family education    PT Goals (Current goals can be found in the Care Plan section)  Acute Rehab PT  Goals Patient Stated Goal: return home PT Goal Formulation: With patient Time For Goal Achievement: 02/07/16 Potential to Achieve Goals: Good    Frequency Min 3X/week   Barriers to discharge        Co-evaluation               End of Session Equipment Utilized During Treatment: Oxygen Activity Tolerance: Patient tolerated treatment well Patient left: in bed;with bed alarm set;with call bell/phone within reach Nurse Communication: Mobility status         Time: 0955-1010 PT Time  Calculation (min) (ACUTE ONLY): 15 min   Charges:   PT Evaluation $PT Eval Moderate Complexity: 1 Procedure     PT G Codes:        Napoleon Monacelli 02-03-2016, 10:16 AM

## 2016-01-24 NOTE — Progress Notes (Addendum)
  Progress Note    01/24/2016 7:40 AM 4 Days Post-Op  Subjective:  Soreness is a little better  Afebrile HR  60's V-paced 82'B-749'T systolic 552% 1VG7FT  Vitals:   01/23/16 2025 01/24/16 0346  BP: (!) 108/57 (!) 95/39  Pulse: 63 62  Resp: 20 20  Temp: 98 F (36.7 C) 98.2 F (36.8 C)    Physical Exam: Cardiac:  regular Lungs:  Non labored Incisions:  Clean and dry with ecchymosis around incision Extremities:  Right foot is warm and well perfused with brisk doppler signals right DP/PT/peroneal; motor is in tact; sensation not in tact.    CBC    Component Value Date/Time   WBC 4.9 01/24/2016 0200   RBC 2.44 (L) 01/24/2016 0200   HGB 7.8 (L) 01/24/2016 0200   HCT 23.4 (L) 01/24/2016 0200   PLT 129 (L) 01/24/2016 0200   MCV 95.9 01/24/2016 0200   MCH 32.0 01/24/2016 0200   MCHC 33.3 01/24/2016 0200   RDW 17.3 (H) 01/24/2016 0200   LYMPHSABS 2.2 01/20/2016 1735   MONOABS 0.5 01/20/2016 1735   EOSABS 0.1 01/20/2016 1735   BASOSABS 0.0 01/20/2016 1735    BMET    Component Value Date/Time   NA 131 (L) 01/24/2016 0200   K 4.4 01/24/2016 0200   CL 98 (L) 01/24/2016 0200   CO2 24 01/24/2016 0200   GLUCOSE 94 01/24/2016 0200   BUN 31 (H) 01/24/2016 0200   CREATININE 1.44 (H) 01/24/2016 0200   CREATININE 1.03 06/02/2013 0929   CALCIUM 8.2 (L) 01/24/2016 0200   GFRNONAA 44 (L) 01/24/2016 0200   GFRAA 51 (L) 01/24/2016 0200    INR    Component Value Date/Time   INR 1.21 01/21/2016 0500     Intake/Output Summary (Last 24 hours) at 01/24/16 0740 Last data filed at 01/24/16 0600  Gross per 24 hour  Intake             1080 ml  Output             1351 ml  Net             -271 ml     Assessment:  82 y.o. male is s/p:   RLE thromboembolectomy, doing well this a.m.  4 Days Post-Op  Plan: -pt with brisk right DP/PT/peroneal doppler signals.  Right foot is warm and motor is in tact.  No sensation (pt states he lost feeling in 1960 from shoveling  snow) -he has not been seen and evaluated by PT/OT--order placed.   -DVT prophylaxis:  Apixaban  -creatinine is down today to 1.44 from 1.69 -acute blood loss anemia-hgb 7.8 today down from 8.0   Leontine Locket, PA-C [facility] [phone] 01/24/2016 7:40 AM   I have independently interviewed patient and agree with PA assessment and plan above.   Katai Marsico C. Donzetta Matters, MD [facility] [place] Office: [phone] Pager: [phone]

## 2016-01-24 NOTE — Progress Notes (Signed)
[name]  JOI:[id number] DOB: Jul 06, 1932 DOA: 01/20/2016 PCP: [name], MD    Brief Narrative:  82 y.o. male with history of systolic CHF, CAD s/pCABG, COPD on 2.5L [place] at baseline, atrial fibrillation, CKD 3, HTN, DM, PPM, and lung andprostate cancers who was discharged from the hospital 11/10 after presenting on 01/16/2016 with dyspnea, orthopnea, leg swelling and 7 lbs weight gain. CXR showedcardiomegaly, small left pleural effusion, elevatedBNP of 1720 and Troponin of 0.03, with withv-paced rhythm on EKG. His hemoglobin was also noted to have acutely dropped without any obvious source.  He was admitted for CHF exacerbation and symptomatic anemia, for which reason he was taken off Xarelto since 01/17/2016 due to suspected GI bleed.  He was treated in the hospital with diuresis. GI consultant saw him and felt nonoperative therapy was warranted and recommended 2 months of PPI therapy.  His overall medical condition improved and was discharged in stable condition with plans to re-start Xarelto that day. His daughter was driving him home and stopped by a pharmacy to pick up his prescriptions, and he complained of acute onset of right leg pain with some weakness and numbness.  In the ED he was started on heparin drip. Neurology was consulted for suspected stroke, and imaging studies which were negative. Color change to the affected foot was noted and Vascular Surgery was consulted. He was noted to have right lower extremity acute ischemia and taken to the OR emergently for thrombectomy.  Subjective: Complaining of toe pain.  Denies dyspnea.  Had BM yesterday.   Assessment & Plan:  ACUTE CRITICAL RIGHT LIMB ISCHEMIA Status post emergent thrombectomy - ongoing care per Vascular Surgery On IV heparin, transition to xarelto 11-12 ---change to eliquis 11-13 due to renal function. On Eliquis, await GI evaluation. Might need to change back to heparin if GI plans for endoscopy.    Normocytic Anemia;  Recent presumed GI bleed plus operative blood loss - follow trend - transfuse as needed to keep hemoglobin 8.0 or > Hb trending down. Will repeat this afternoon. Might need blood transfusion.  Check occult blood.  [facility] consulted.   DM2 Well-controlled at the present time - follow CBG - A1c 5.4% Aug 2017  CAD s/p CABG 2004 Asymptomatic  AFib s/p ACID -CHADS-Vasc score of at least 5 - Was on  anticoagulated with xarelto as outpt interrupted by bleeding   Chronic Systolic CHF TTE 4709 w/ EF 20-25% - EDW ~125lb (recent d/c wgt 124lb) - no ACEi/ARB due to CKD  Filed Weights   01/22/16 0328 01/23/16 0423 01/24/16 0346  Weight: 56.6 kg (124 lb 12.5 oz) 56.4 kg (124 lb 5.4 oz) 58.9 kg (129 lb 13.6 oz)   -Hold spironolactone due to increase cr. Will continue with lasix.   Chronic hypoxic respiratory failure COPD On 2.5 L/m oxygen at home  CKD Stage 3, 1.4---1.5 Cr decrease to 1.44.    Recent Labs Lab 01/20/16 1741 01/21/16 0500 01/22/16 0236 01/23/16 1016 01/24/16 0200  CREATININE 1.40* 1.15 1.48* 1.69* 1.44*   Stable. Monitor on lasix.   HTN Blood pressure currently well controlled  DVT prophylaxis: IV heparin  Code Status: DNR - NO CODE Family Communication: Daughter by phone 11-13 Disposition Plan:   Consultants:  Vasc Surgery   Procedures: 11/10 R LE thromboembolectomy   Antimicrobials:  none  Objective: Blood pressure 113/60, pulse 60, temperature 98.2 F (36.8 C), temperature source Oral, resp. rate 20, height '5\' 6"'$  (1.676 m), weight 58.9 kg (129  lb 13.6 oz), SpO2 100 %.  Intake/Output Summary (Last 24 hours) at 01/24/16 1110 Last data filed at 01/24/16 0900  Gross per 24 hour  Intake             1080 ml  Output             1350 ml  Net             -270 ml   Filed Weights   01/22/16 0328 01/23/16 0423 01/24/16 0346  Weight: 56.6 kg (124 lb 12.5 oz) 56.4 kg (124 lb 5.4 oz) 58.9 kg (129 lb 13.6 oz)     Examination: General: No acute respiratory distress Lungs: Clear to auscultation bilaterally without wheezes or crackles Cardiovascular: Regular rate and rhythm without murmur gallop or rub normal S1 and S2 Abdomen: Nontender, nondistended, soft, bowel sounds positive, no rebound, no ascites, no appreciable mass Extremities: No significant edema bilateral lower extremities, incision right leg, without redness, healing.   CBC:  Recent Labs Lab 01/20/16 1735 01/20/16 1741 01/21/16 0500 01/22/16 0236 01/23/16 1016 01/24/16 0200  WBC 4.8  --  3.9* 3.6* 4.0 4.9  NEUTROABS 2.1  --   --   --   --   --   HGB 10.7* 11.6* 9.2* 9.1* 8.0* 7.8*  HCT 32.4* 34.0* 28.1* 27.6* 23.9* 23.4*  MCV 95.9  --  94.9 95.8 96.4 95.9  PLT 188  --  147* 149* 131* 010*   Basic Metabolic Panel:  Recent Labs Lab 01/19/16 0304  01/20/16 1735 01/20/16 1741 01/21/16 0500 01/22/16 0236 01/23/16 1016 01/24/16 0200  NA 133*  < > 131* 132* 134* 133* 133* 131*  K 4.1  < > 4.9 4.8 4.1 4.8 4.2 4.4  CL 94*  < > 95* 95* 99* 98* 99* 98*  CO2 27  < > 24  --  '25 26 24 24  '$ GLUCOSE 88  < > 176* 178* 87 124* 142* 94  BUN 37*  < > 41* 53* 31* 28* 31* 31*  CREATININE 1.35*  < > 1.35* 1.40* 1.15 1.48* 1.69* 1.44*  CALCIUM 8.8*  < > 8.7*  --  8.4* 8.6* 8.3* 8.2*  MG 2.2  --   --   --   --   --   --   --   < > = values in this interval not displayed. GFR: Estimated Creatinine Clearance: 33.5 mL/min (by C-G formula based on SCr of 1.44 mg/dL (H)).  Liver Function Tests:  Recent Labs Lab 01/20/16 1735 01/21/16 0500 01/22/16 0236  AST 33 20 25  ALT '25 21 20  '$ ALKPHOS 112 83 74  BILITOT 1.5* 1.0 1.4*  PROT 7.1 6.6 6.2*  ALBUMIN 3.6 3.3* 3.1*    Coagulation Profile:  Recent Labs Lab 01/20/16 1735 01/21/16 0500  INR 1.07 1.21    Cardiac Enzymes:  Recent Labs Lab 01/21/16 0500 01/21/16 1300  TROPONINI <0.03 <0.03    HbA1C: Hgb A1c MFr Bld  Date/Time Value Ref Range Status  10/27/2015 05:18  AM 5.4 4.8 - 5.6 % Final    Comment:    (NOTE)         Pre-diabetes: 5.7 - 6.4         Diabetes: >6.4         Glycemic control for adults with diabetes: <7.0   05/14/2011 09:16 PM 5.6 <5.7 % Final    Comment:    (NOTE)  According to the ADA Clinical Practice Recommendations for 2011, when HbA1c is used as a screening test:  >=6.5%   Diagnostic of Diabetes Mellitus           (if abnormal result is confirmed) 5.7-6.4%   Increased risk of developing Diabetes Mellitus References:Diagnosis and Classification of Diabetes Mellitus,Diabetes DEYC,1448,18(HUDJS 1):S62-S69 and Standards of Medical Care in         Diabetes - 2011,Diabetes HFWY,6378,58 (Suppl 1):S11-S61.    CBG:  Recent Labs Lab 01/20/16 1733 01/20/16 2313 01/21/16 1525 01/21/16 2030 01/22/16 0631  GLUCAP 170* 147* 110* 157* 129*    Recent Results (from the past 240 hour(s))  MRSA PCR Screening     Status: None   Collection Time: 01/17/16  3:34 AM  Result Value Ref Range Status   MRSA by PCR NEGATIVE NEGATIVE Final    Comment:        The GeneXpert MRSA Assay (FDA approved for NASAL specimens only), is one component of a comprehensive MRSA colonization surveillance program. It is not intended to diagnose MRSA infection nor to guide or monitor treatment for MRSA infections.      Scheduled Meds: . allopurinol  300 mg Oral Daily  . apixaban  10 mg Oral BID   Followed by  . [START ON 01/30/2016] apixaban  5 mg Oral BID  . ferrous sulfate  325 mg Oral BID WC  . furosemide  20 mg Oral Daily  . isosorbide mononitrate  30 mg Oral Daily  . mouth rinse  15 mL Mouth Rinse BID  . metoprolol succinate  25 mg Oral Daily  .  morphine injection  4 mg Intravenous Once  . pantoprazole  40 mg Oral BID  . polyethylene glycol  17 g Oral Daily   Continuous Infusions: . lactated ringers 10 mL/hr at 01/21/16 1400     LOS: 4 days   Niel Hummer,  MD [facility] [phone]  On-Call/Text Page:      [email or web address]      password [email or web address]  If 7PM-7AM, please contact night-coverage [email or web address] Password [email or web address] 01/24/2016, 11:10 AM

## 2016-01-24 NOTE — Consult Note (Signed)
[name] Consult Note  Referring Provider: No ref. provider found Primary Care Physician:  [name], MD Primary Gastroenterologist:  Dr.  Laurel Dimmer Complaint: Reevaluation request for anemia HPI: [name] is an 82 y.o. white male  originally seen by [name] on November 7 after he presented with dyspnea with a hemoglobin of 8.5 down 1.2 g from baseline 1 month earlier. He had a guaiac positive stool without melena or hematochezia. I think he might been transfused 1 unit of packed red blood cells after further fall in hemoglobin to 7.7. His Xarelto was withheld and due to stability and absence of significant evidence of bleeding it was elected to manage him expectantly and empiric treatment with a PPI. On November 10 driving home from discharge he experienced lower extremity pain and developed an arterial thrombus in his right lower extremity. His readmission hemoglobin was 11.6 and has gradually fallen to 8.5. He was taken to the OR for thrombectomy.[name] He was put back on Eliquis in substitution for Xarelto. He states he had a bowel movement yesterday which was normal in appearance. Hemoccult is ordered for the next bowel movement. His last EGD was in 2012 and was relatively unremarkable, and his last colonoscopy in 2010 reportedly showed no polyps.  Past Medical History:  Diagnosis Date  . Arthritis   . Black lung disease ([place])   . CAD (coronary artery disease)    a. 07/2002 CABG x 3: LIMA->LAD, VG->Diag, VG->OM;  b. 06/2006 Cath: LM 50-60ost/p, LAD patent mid stent, D1 sev dzs, D2 patent stent, LCX nl, OM2 sev sten prox, RCA large/nl, VG->Diag nl, VG->OM nl, LIMA->LAD atretic, EF 30%.  . Carotid artery occlusion   . Chronic atrial fibrillation ([place])   . Chronic respiratory failure ([place])   . Chronic systolic CHF (congestive heart failure) ([place])    a. 12/2012 Echo: EF 20-25%, mid-dist antsept AK, mod dil LA.  [name] COPD (chronic obstructive pulmonary disease) ([place])    a. On home O2.  [name]  CVA (cerebral vascular accident) ([place])   . Diabetes mellitus   . GERD (gastroesophageal reflux disease)   . Gout   . Hypercholesterolemia   . Hypertension   . Ischemic cardiomyopathy    a. 06/2006  BIV-ICD -> gen change 2015 to [place].  . Lung cancer ([place])   . Nephrolithiasis 09/2000  . On home oxygen therapy    "2.5L; 24/7" (01/16/2016)  . Prostate cancer ([place]) 11/01/10   gleason 7, 8, 9, gold seeds 02/08/11  . Symptomatic bradycardia     Past Surgical History:  Procedure Laterality Date  . BI-VENTRICULAR PACEMAKER INSERTION (CRT-P)  12-02-13   downgrade of previously implanted CRTD to STJ CRTP  . BIV PACEMAKER GENERATOR CHANGE OUT N/A 12/02/2013   Procedure: BIV PACEMAKER GENERATOR CHANGE OUT;  Surgeon: Evans Lance, MD;  Location[facility] CATH LAB;  Service: Cardiovascular;  Laterality: N/A;  . COLONOSCOPY    . CORONARY ANGIOPLASTY WITH STENT PLACEMENT  07/1997; 08/1997;03/1998  . CORONARY ARTERY BYPASS GRAFT  07/2002   CABG X3  . ESOPHAGOGASTRODUODENOSCOPY  02/19/2011   Procedure: ESOPHAGOGASTRODUODENOSCOPY (EGD);  Surgeon: Jeryl Columbia, MD;  Location: [facility] ENDOSCOPY;  Service: Endoscopy;  Laterality: N/A;  . FEMORAL-POPLITEAL BYPASS GRAFT Right 01/20/2016   Procedure: THROMBECTOMY OF POPLITEAL ARTERY AND ANTERIOR TIBIAL ARTERY RIGHT LEG; INTRAOPERATIVE ARTERIOGRAM;  Surgeon: Waynetta Sandy, MD;  Location: [facility];  Service: Vascular;  Laterality: Right;  . INCISION AND DRAINAGE OF WOUND  08/2002   right thigh; S/P  EVH  . INSERT / REPLACE / REMOVE PACEMAKER  1979; 1992; 01/2000;  . INSERT / REPLACE / REMOVE PACEMAKER  09/2003; 06/2006   w/AICD  . INSERT / REPLACE / REMOVE PACEMAKER  12/2004   pacmaker explant  . SHOULDER ARTHROSCOPY W/ ROTATOR CUFF REPAIR  05/2008   left  . upper endoscpopy      Medications Prior to Admission  Medication Sig Dispense Refill  . acetaminophen (TYLENOL) 500 MG tablet Take 500 mg by mouth every 6 (six) hours as needed (pain).    [name] albuterol  (PROVENTIL HFA;VENTOLIN HFA) 108 (90 Base) MCG/ACT inhaler Inhale 2 puffs into the lungs every 4 (four) hours as needed for wheezing or shortness of breath. 1 Inhaler 2  . albuterol (PROVENTIL) (2.5 MG/3ML) 0.083% nebulizer solution Take 2.5 mg by nebulization every 6 (six) hours as needed for wheezing or shortness of breath.    . allopurinol (ZYLOPRIM) 300 MG tablet Take 300 mg by mouth daily.      . benzonatate (TESSALON) 100 MG capsule Take 1 capsule (100 mg total) by mouth every 8 (eight) hours. 21 capsule 0  . CALCIUM-VITAMIN D PO Take 1 tablet by mouth 2 (two) times daily.     . furosemide (LASIX) 20 MG tablet Take 20 mg by mouth daily.    [name] guaifenesin (ROBITUSSIN) 100 MG/5ML syrup Take 200 mg by mouth 3 (three) times daily as needed for cough.    . hydrocortisone cream 1 % Apply to affected area 2 times daily (Patient taking differently: Apply 1 application topically 2 (two) times daily as needed for itching. Apply to affected area 2 times daily) 15 g 0  . isosorbide mononitrate (IMDUR) 30 MG 24 hr tablet Take 30 mg by mouth daily.  1  . metoprolol succinate (TOPROL-XL) 25 MG 24 hr tablet Take 25 mg by mouth daily.    . nitroGLYCERIN (NITROSTAT) 0.4 MG SL tablet Place 0.4 mg under the tongue every 5 (five) minutes as needed for chest pain.    . Omega-3 Fatty Acids (FISH OIL) 1000 MG CAPS Take 1,000 mg by mouth at bedtime.     [name] omeprazole (PRILOSEC) 40 MG capsule Take 1 capsule (40 mg total) by mouth 2 (two) times daily. 60 capsule 1  . OXYGEN Inhale 2.5 L into the lungs continuous.    . Rivaroxaban (XARELTO) 15 MG TABS tablet Take 1 tablet (15 mg total) by mouth daily with supper. 30 tablet 1  . spironolactone (ALDACTONE) 25 MG tablet Take 0.5 tablets (12.5 mg total) by mouth daily. 30 tablet 0  . vitamin B-12 (CYANOCOBALAMIN) 1000 MCG tablet Take 1,000 mcg by mouth 2 (two) times daily.      Allergies:  Allergies  Allergen Reactions  . Aspirin Hives  . Penicillins Rash    Rash Has  patient had a PCN reaction causing immediate rash, facial/tongue/throat swelling, SOB or lightheadedness with hypotension:YES Has patient had a PCN reaction causing severe rash involving mucus membranes or skin necrosis: NO Has patient had a PCN reaction that required hospitalization NO Has patient had a PCN reaction occurring within the last 10 years:NO If all of the above answers are "NO", then may proceed with Cephalosporin use.    Family History  Problem Relation Age of Onset  . Alzheimer's disease Father 16  . Cancer Father 87    metastatic prostate cancer  . Diabetes Sister 3  . Diabetes Brother   . Diabetes Brother   . Diabetes Brother   .  Hypotension Neg Hx   . Malignant hyperthermia Neg Hx   . Pseudochol deficiency Neg Hx     Social History:  reports that he quit smoking about 66 years ago. His smoking use included Cigarettes. His smokeless tobacco use includes Chew. He reports that he does not drink alcohol or use drugs.  Review of Systems: negative except As above   Blood pressure 113/60, pulse 60, temperature 98.2 F (36.8 C), temperature source Oral, resp. rate 20, height '5\' 6"'$  (1.676 m), weight 58.9 kg (129 lb 13.6 oz), SpO2 100 %. Head: Normocephalic, without obvious abnormality, atraumatic Neck: no adenopathy, no carotid bruit, no JVD, supple, symmetrical, trachea midline and thyroid not enlarged, symmetric, no tenderness/mass/nodules Resp: clear to auscultation bilaterally Cardio: regular rate and rhythm, S1, S2 normal, no murmur, click, rub or gallop GI: Abdomen soft nondistended, nontender Extremities: extremities normal, atraumatic, no cyanosis or edema  Results for orders placed or performed during the hospital encounter of 01/20/16 (from the past 48 hour(s))  CBC     Status: Abnormal   Collection Time: 01/23/16 10:16 AM  Result Value Ref Range   WBC 4.0 4.0 - 10.5 K/uL   RBC 2.48 (L) 4.22 - 5.81 MIL/uL   Hemoglobin 8.0 (L) 13.0 - 17.0 g/dL   HCT 23.9  (L) 39.0 - 52.0 %   MCV 96.4 78.0 - 100.0 fL   MCH 32.3 26.0 - 34.0 pg   MCHC 33.5 30.0 - 36.0 g/dL   RDW 17.4 (H) 11.5 - 15.5 %   Platelets 131 (L) 150 - 400 K/uL  Basic metabolic panel     Status: Abnormal   Collection Time: 01/23/16 10:16 AM  Result Value Ref Range   Sodium 133 (L) 135 - 145 mmol/L   Potassium 4.2 3.5 - 5.1 mmol/L   Chloride 99 (L) 101 - 111 mmol/L   CO2 24 22 - 32 mmol/L   Glucose, Bld 142 (H) 65 - 99 mg/dL   BUN 31 (H) 6 - 20 mg/dL   Creatinine, Ser 1.69 (H) 0.61 - 1.24 mg/dL   Calcium 8.3 (L) 8.9 - 10.3 mg/dL   GFR calc non Af Amer 36 (L) >60 mL/min   GFR calc Af Amer 42 (L) >60 mL/min    Comment: (NOTE) The eGFR has been calculated using the CKD EPI equation. This calculation has not been validated in all clinical situations. eGFR's persistently <60 mL/min signify possible Chronic Kidney Disease.    Anion gap 10 5 - 15  CBC     Status: Abnormal   Collection Time: 01/24/16  2:00 AM  Result Value Ref Range   WBC 4.9 4.0 - 10.5 K/uL   RBC 2.44 (L) 4.22 - 5.81 MIL/uL   Hemoglobin 7.8 (L) 13.0 - 17.0 g/dL   HCT 23.4 (L) 39.0 - 52.0 %   MCV 95.9 78.0 - 100.0 fL   MCH 32.0 26.0 - 34.0 pg   MCHC 33.3 30.0 - 36.0 g/dL   RDW 17.3 (H) 11.5 - 15.5 %   Platelets 129 (L) 150 - 400 K/uL  Basic metabolic panel     Status: Abnormal   Collection Time: 01/24/16  2:00 AM  Result Value Ref Range   Sodium 131 (L) 135 - 145 mmol/L   Potassium 4.4 3.5 - 5.1 mmol/L   Chloride 98 (L) 101 - 111 mmol/L   CO2 24 22 - 32 mmol/L   Glucose, Bld 94 65 - 99 mg/dL   BUN 31 (H) 6 - 20 mg/dL  Creatinine, Ser 1.44 (H) 0.61 - 1.24 mg/dL   Calcium 8.2 (L) 8.9 - 10.3 mg/dL   GFR calc non Af Amer 44 (L) >60 mL/min   GFR calc Af Amer 51 (L) >60 mL/min    Comment: (NOTE) The eGFR has been calculated using the CKD EPI equation. This calculation has not been validated in all clinical situations. eGFR's persistently <60 mL/min signify possible Chronic Kidney Disease.    Anion gap 9  5 - 15  Hemoglobin and hematocrit, blood     Status: Abnormal   Collection Time: 01/24/16 11:56 AM  Result Value Ref Range   Hemoglobin 8.5 (L) 13.0 - 17.0 g/dL   HCT 26.2 (L) 39.0 - 52.0 %   No results found.  Assessment: Persistent anemia with's probably some downward trend with a guaiac positive stool on last admission, and a coagulation resumed. Plan:  1. Continue empiric oral PPI 2. Await repeat Hemoccult testing 3. Monitor stools and hemoglobin 4. Consider EGD plus minus colonoscopy depending on findings and clinical course. Rini Moffit C 01/24/2016, 12:54 PM  Pager [phone] If no answer or after 5 PM call [phone]

## 2016-01-24 NOTE — Care Management Important Message (Signed)
Important Message  Patient Details  Name: [name] MRN: [id number] Date of Birth: 1933/01/15   Medicare Important Message Given:  Yes    Nathen May 01/24/2016, 8:42 AM

## 2016-01-25 ENCOUNTER — Telehealth: Payer: Self-pay | Admitting: Vascular Surgery

## 2016-01-25 LAB — BASIC METABOLIC PANEL
Anion gap: 10 (ref 5–15)
BUN: 23 mg/dL — ABNORMAL HIGH (ref 6–20)
CALCIUM: 8.6 mg/dL — AB (ref 8.9–10.3)
CO2: 26 mmol/L (ref 22–32)
CREATININE: 1.38 mg/dL — AB (ref 0.61–1.24)
Chloride: 97 mmol/L — ABNORMAL LOW (ref 101–111)
GFR calc Af Amer: 54 mL/min — ABNORMAL LOW (ref >60)
GFR calc non Af Amer: 46 mL/min — ABNORMAL LOW (ref >60)
Glucose, Bld: 104 mg/dL — ABNORMAL HIGH (ref 65–99)
Potassium: 4.8 mmol/L (ref 3.5–5.1)
Sodium: 133 mmol/L — ABNORMAL LOW (ref 135–145)

## 2016-01-25 LAB — CBC
HEMATOCRIT: 24.1 % — AB (ref 39.0–52.0)
Hemoglobin: 8.2 g/dL — ABNORMAL LOW (ref 13.0–17.0)
MCH: 32.9 pg (ref 26.0–34.0)
MCHC: 34 g/dL (ref 30.0–36.0)
MCV: 96.8 fL (ref 78.0–100.0)
Platelets: 154 10*3/uL (ref 150–400)
RBC: 2.49 MIL/uL — ABNORMAL LOW (ref 4.22–5.81)
RDW: 17 % — AB (ref 11.5–15.5)
WBC: 4.3 10*3/uL (ref 4.0–10.5)

## 2016-01-25 LAB — OCCULT BLOOD X 1 CARD TO LAB, STOOL: Fecal Occult Bld: NEGATIVE

## 2016-01-25 NOTE — Progress Notes (Signed)
Pt's creatinine continues to improve and hgb is stable and blood pressure is tolerating.    Will see pt after OR this morning.   Leontine Locket, [facility] 01/25/2016 8:54 AM

## 2016-01-25 NOTE — Evaluation (Signed)
Occupational Therapy Evaluation Patient Details Name: [name] MRN: [id number] DOB: Nov 15, 1932 Today's Date: 01/25/2016    History of Present Illness Pt is an 82 y/o male s/p RLE thromboembolectomy 01/20/16   Clinical Impression   Pt reports he was independent with ADL PTA but only sponge bathes at the sink. Currently pt overall min guard for functional mobility and ADL with the exception of min assist for LB dressing. Pt planning to d/c home with intermittent family supervision upon return home. Pt would benefit from continued skilled OT to address established goals.    Follow Up Recommendations  No OT follow up;Supervision/Assistance - 24 hour    Equipment Recommendations  None recommended by OT    Recommendations for Other Services       Precautions / Restrictions Precautions Precautions: Fall Restrictions Weight Bearing Restrictions: No      Mobility Bed Mobility Overal bed mobility: Needs Assistance Bed Mobility: Supine to Sit     Supine to sit: Min guard     General bed mobility comments: Min guard for safety. Increased time required with use of bed rail and HOB flat.  Transfers Overall transfer level: Needs assistance Equipment used: Rolling walker (2 wheeled) Transfers: Sit to/from Stand Sit to Stand: Min guard         General transfer comment: Min guard for safety. Increased time required.    Balance Overall balance assessment: Needs assistance Sitting-balance support: Feet supported;No upper extremity supported Sitting balance-Leahy Scale: Good     Standing balance support: Bilateral upper extremity supported Standing balance-Leahy Scale: Poor Standing balance comment: RW for support                            ADL Overall ADL's : Needs assistance/impaired Eating/Feeding: Set up;Sitting   Grooming: Min guard;Standing   Upper Body Bathing: Set up;Supervision/ safety;Sitting   Lower Body Bathing: Min guard;Sit to/from  stand   Upper Body Dressing : Set up;Supervision/safety;Sitting   Lower Body Dressing: Minimal assistance;Sit to/from stand   Toilet Transfer: Min guard;Ambulation;RW;BSC Toilet Transfer Details (indicate cue type and reason): Simulated by sit to stand from EOB with functional mobility in room         Functional mobility during ADLs: Min guard;Rolling walker General ADL Comments: Upon arrival; pt found in bed with supplemental O2 removed-SpO2=99%. With mobililty O2 applied at 3L via nasal canula. Following mobility SpO2=mid 90s.     Vision     Perception     Praxis      Pertinent Vitals/Pain Pain Assessment: No/denies pain     Hand Dominance Right   Extremity/Trunk Assessment Upper Extremity Assessment Upper Extremity Assessment: Overall WFL for tasks assessed   Lower Extremity Assessment Lower Extremity Assessment: Defer to PT evaluation   Cervical / Trunk Assessment Cervical / Trunk Assessment: Kyphotic   Communication Communication Communication: HOH   Cognition Arousal/Alertness: Awake/alert Behavior During Therapy: WFL for tasks assessed/performed Overall Cognitive Status: Within Functional Limits for tasks assessed                     General Comments       Exercises       Shoulder Instructions      Home Living Family/patient expects to be discharged to:: Private residence Living Arrangements: Other relatives (grandson) Available Help at Discharge: Family;Available PRN/intermittently Type of Home: House Home Access: Stairs to enter     Home Layout: One level  Bathroom Toilet: Standard     Home Equipment: Environmental consultant - 2 wheels;Walker - 4 wheels;Hospital bed;Bedside commode;Other (comment) (oxygen)          Prior Functioning/Environment Level of Independence: Independent with assistive device(s)        Comments: pt states he has RW to use around the house and sleeps in a hospital bed, uses O2 at home. Does not get in  shower, only sponge bathes.        OT Problem List: Decreased activity tolerance;Impaired balance (sitting and/or standing);Decreased safety awareness;Decreased knowledge of use of DME or AE;Decreased knowledge of precautions   OT Treatment/Interventions: Self-care/ADL training;Energy conservation;DME and/or AE instruction;Therapeutic activities;Patient/family education;Balance training    OT Goals(Current goals can be found in the care plan section) Acute Rehab OT Goals Patient Stated Goal: return home OT Goal Formulation: With patient Time For Goal Achievement: 02/08/16 Potential to Achieve Goals: Good ADL Goals Pt Will Perform Grooming: with modified independence;standing Pt Will Perform Upper Body Bathing: with modified independence;sitting Pt Will Perform Lower Body Bathing: with modified independence;sit to/from stand Pt Will Transfer to Toilet: with modified independence;ambulating;regular height toilet Pt Will Perform Toileting - Clothing Manipulation and hygiene: with modified independence;sit to/from stand  OT Frequency: Min 2X/week   Barriers to D/C:            Co-evaluation              End of Session Equipment Utilized During Treatment: Rolling walker;Oxygen Nurse Communication: Mobility status  Activity Tolerance: Patient tolerated treatment well Patient left: in chair;with call bell/phone within reach   Time: 1410-3013 OT Time Calculation (min): 18 min Charges:  OT General Charges $OT Visit: 1 Procedure OT Evaluation $OT Eval Moderate Complexity: 1 Procedure G-Codes:     Binnie Kand M.S., OTR/L Pager: [phone]  01/25/2016, 5:30 PM

## 2016-01-25 NOTE — Telephone Encounter (Signed)
Sched appt 02/17/16 at 4:00. Spoke to pt's daughter to inform them of appt.

## 2016-01-25 NOTE — Progress Notes (Signed)
Treston E Piet 11:27 AM  Subjective: Patient without signs of bleeding and minimal lower discomfort and no other GI complaints and his case discussed with my partner Dr. Amedeo Plenty and his hospital computer chart reviewed and his main complaint is his leg tolerating regular food    Objective: Vital signs stable afebrile no acute distress abdomen is soft occasional bowel sounds no significant tenderness no guarding or rebound hemoglobin stable guaiac-negative CT without significant finding  Assessment: Multiple medical complaints including chronic anemia with periodic blood in stool  Plan: Do not think patient needs workup at this time but happy to see back as an outpatient if primary physician would like repeat GI workup or if needed before discharge please let us know  [facility] E  Pager [phone] After 5PM or if no answer call [phone]

## 2016-01-25 NOTE — Telephone Encounter (Signed)
-----   Message from Mena Goes, RN sent at 01/25/2016 10:47 AM EST ----- Regarding: schedule 2-3 weeks   ----- Message ----- From: Alvia Grove, PA-C Sent: 01/25/2016   9:23 AM To: Vvs Charge Pool  S/p RLE thromboembolectomy 01/20/16  F/u with Dr. Donzetta Matters in 2-3 weeks  Thanks Maudie Mercury

## 2016-01-25 NOTE — Progress Notes (Addendum)
  [facility] Progress Note  Subjective  - POD #5  Pain at left below knee incision. Pain in left foot is better.   Objective Vitals:   01/25/16 0421 01/25/16 0840  BP: (!) 100/51 (!) 108/57  Pulse: 66 64  Resp: 20   Temp: 98 F (36.7 C) 97.4 F (36.3 C)    Intake/Output Summary (Last 24 hours) at 01/25/16 0916 Last data filed at 01/25/16 0830  Gross per 24 hour  Intake          1241.33 ml  Output             1250 ml  Net            -8.67 ml   Left below knee incision clean and intact. Some fullness and ecchymosis around incision but area is soft. Audible doppler flow left DP and PT.    Assessment/Planning: 82 y.o. male is s/p: RLE thromboembolectomy 5 Days Post-Op   Right leg well perfused. On Eliquis for atrial fibrillation.  GI following for anemia. Hgb currently stable. Hemoccult negative today.  CKD: Creatinine stable.  Stable from vascular standpoint for d/c. F/u with Dr. Donzetta Matters in 2 weeks.   Alvia Grove 01/25/2016 9:16 AM --  Laboratory CBC    Component Value Date/Time   WBC 4.3 01/25/2016 0256   HGB 8.2 (L) 01/25/2016 0256   HCT 24.1 (L) 01/25/2016 0256   PLT 154 01/25/2016 0256    BMET    Component Value Date/Time   NA 133 (L) 01/25/2016 0256   K 4.8 01/25/2016 0256   CL 97 (L) 01/25/2016 0256   CO2 26 01/25/2016 0256   GLUCOSE 104 (H) 01/25/2016 0256   BUN 23 (H) 01/25/2016 0256   CREATININE 1.38 (H) 01/25/2016 0256   CREATININE 1.03 06/02/2013 0929   CALCIUM 8.6 (L) 01/25/2016 0256   GFRNONAA 46 (L) 01/25/2016 0256   GFRAA 54 (L) 01/25/2016 0256    COAG Lab Results  Component Value Date   INR 1.21 01/21/2016   INR 1.07 01/20/2016   INR 2.50 (H) 12/16/2012   No results found for: PTT  Antibiotics Anti-infectives    None       Virgina Jock, PA-C [facility] Office: [phone] Pager: [phone] 01/25/2016 9:16 AM    I have independently interviewed patient and agree with  PA assessment and plan above.   Remi Lopata C. Donzetta Matters, MD [facility] [place] Office: [phone] Pager: [phone]

## 2016-01-25 NOTE — Progress Notes (Signed)
PROGRESS NOTE    [name]  NWG:[id number] DOB: 19-Jul-1932 DOA: 01/20/2016 PCP: [name], MD   Brief Narrative:  82 y.o.malewith history of systolic CHF, CAD s/pCABG, COPD on 2.5L [place] at baseline, atrial fibrillation, CKD 3, HTN, DM, PPM, and lung andprostate cancers who was discharged from the hospital 11/10 after presenting on11/08/2015 withdyspnea, orthopnea, leg swelling and 7 lbs weight gain. CXR showedcardiomegaly, small left pleural effusion, elevatedBNP of1720 and Troponin of0.03, withwithv-paced rhythm on EKG. His hemoglobin was also noted to haveacutely dropped without any obvious source.  He was admitted for CHF exacerbation and symptomatic anemia, for which reason he wastakenoff Xarelto since 11/07/2017due to suspected GI bleed.  He was treated in the hospital with diuresis. GI consultant saw himand felt nonoperative therapy was warranted and recommended 2 months of PPI therapy.  His overall medical condition improved and was discharged in stable condition on 01/20/2016 with plans to re-start Xarelto that day. Hisdaughter was driving him home and stoppedby a pharmacy to pick up his prescriptions, and he complained ofacute onset of right leg pain with some weakness andnumbness.  Neurology was consulted for suspected stroke, and imaging studies which were negative. Color change to the affected foot was noted and Vascular Surgery was consulted. He was noted to have right lower extremity acute ischemia and taken to the OR emergently for thrombectomy. Patient is now on eliquis, switched from [place] due to renal function. On 01/25/16 creatinine is 1.38, hemoglobin is 8.2, fecal occult negative.    Assessment & Plan:   Active Problems:   Prostate cancer, recur risk not determined whether low, med or high (HCC)   Anemia   Acute on chronic systolic CHF (congestive heart failure) (HCC)   Arterial thrombosis (HCC)   DM2 (diabetes mellitus, type 2) (HCC)  Critical lower limb ischemia   Ischemic leg   ACUTE CRITICAL RIGHT LIMB ISCHEMIA - Status post emergent thrombectomy - stable for discharge per Vascular Surgery - Was on IV heparin, transitioned to xarelto 11-12 ---changed to eliquis 11-13 due to renal function. - Patient is doing well. If CBC stable, discharge tomorrow with home health PT    Normocytic Anemia - Recent presumed GI bleed plus operative blood loss, Hb 8.2, transfuse as needed to keep hemoglobin 8.0 or > Hb trending down.  - Occult blood negative - GI consulted, no further work up for GI bleed at this point. Follow up as outpatient for repeat GI workup if indicated  DM2 - Well-controlled at the present time - follow CBG - A1c 5.4% Aug 2017  CAD s/p CABG 2004 - Asymptomatic  AFib s/p ACID - CHADS-Vasc score of at least 5 - Was anticoagulated with xarelto as outpt interrupted by bleeding  - Xarelto switched to eliquis due to renal disfunction  Chronic Systolic CHF - TTE 5784 w/ EF 20-25% - EDW ~125lb (recent d/c wgt 124lb) - no ACEi/ARB due to CKD - Stable at this time   DVT prophylaxis: Eliquis Code Status: DNR Family Communication: None at bedside Disposition Plan: Discharge tomorrow with home health PT   Consultants:   Vascular surgery  Gastroenterology  Procedures:   None  Antimicrobials:   None     Subjective: Patient is POD5 and doing well. Patient has 9/10 pain without medications. Denies dyspnea, chest pain, palpitations, hematochezia, and syncope. Has some edema.  Objective: Vitals:   01/24/16 1950 01/25/16 0421 01/25/16 0840 01/25/16 0946  BP: (!) 119/58 (!) 100/51 (!) 108/57 119/72  Pulse: 65 66 64 66  Resp: 18 20    Temp: 98.3 F (36.8 C) 98 F (36.7 C) 97.4 F (36.3 C)   TempSrc: Oral Oral Oral   SpO2: 100% 100% 100%   Weight:  56.3 kg (124 lb 1.9 oz)    Height:        Intake/Output Summary (Last 24 hours) at 01/25/16 1047 Last data filed at 01/25/16 0830  Gross per  24 hour  Intake          1241.33 ml  Output             1250 ml  Net            -8.67 ml   Filed Weights   01/23/16 0423 01/24/16 0346 01/25/16 0421  Weight: 56.4 kg (124 lb 5.4 oz) 58.9 kg (129 lb 13.6 oz) 56.3 kg (124 lb 1.9 oz)   Examination:  General exam: Appears calm and comfortable  Respiratory system: Clear to auscultation. Respiratory effort normal. Cardiovascular system: S1 & S2 heard, RRR. No JVD, murmurs, rubs, gallops or clicks. No pedal edema. Gastrointestinal system: Abdomen is nondistended, soft and nontender. No organomegaly or masses felt.  Central nervous system: Alert and oriented. No focal neurological deficits. Extremities: Symmetric 5 x 5 power. Knot and tenderness of right MTP. Right LE incision is clean, dry, and intact. Healing well. Significant ecchymosis. Skin: No rashes, lesions or ulcers Psychiatry: Judgement and insight appear normal. Mood & affect appropriate.   Data Reviewed: I have personally reviewed following labs and imaging studies  CBC:  Recent Labs Lab 01/20/16 1735  01/21/16 0500 01/22/16 0236 01/23/16 1016 01/24/16 0200 01/24/16 1156 01/25/16 0256  WBC 4.8  --  3.9* 3.6* 4.0 4.9  --  4.3  NEUTROABS 2.1  --   --   --   --   --   --   --   HGB 10.7*  < > 9.2* 9.1* 8.0* 7.8* 8.5* 8.2*  HCT 32.4*  < > 28.1* 27.6* 23.9* 23.4* 26.2* 24.1*  MCV 95.9  --  94.9 95.8 96.4 95.9  --  96.8  PLT 188  --  147* 149* 131* 129*  --  154  < > = values in this interval not displayed. Basic Metabolic Panel:  Recent Labs Lab 01/19/16 0304  01/21/16 0500 01/22/16 0236 01/23/16 1016 01/24/16 0200 01/25/16 0256  NA 133*  < > 134* 133* 133* 131* 133*  K 4.1  < > 4.1 4.8 4.2 4.4 4.8  CL 94*  < > 99* 98* 99* 98* 97*  CO2 27  < > '25 26 24 24 26  '$ GLUCOSE 88  < > 87 124* 142* 94 104*  BUN 37*  < > 31* 28* 31* 31* 23*  CREATININE 1.35*  < > 1.15 1.48* 1.69* 1.44* 1.38*  CALCIUM 8.8*  < > 8.4* 8.6* 8.3* 8.2* 8.6*  MG 2.2  --   --   --   --   --   --    < > = values in this interval not displayed. GFR: Estimated Creatinine Clearance: 33.4 mL/min (by C-G formula based on SCr of 1.38 mg/dL (H)).  Liver Function Tests:  Recent Labs Lab 01/20/16 1735 01/21/16 0500 01/22/16 0236  AST 33 20 25  ALT '25 21 20  '$ ALKPHOS 112 83 74  BILITOT 1.5* 1.0 1.4*  PROT 7.1 6.6 6.2*  ALBUMIN 3.6 3.3* 3.1*   Coagulation Profile:  Recent Labs Lab 01/20/16 1735 01/21/16 0500  INR 1.07 1.21  Cardiac Enzymes:  Recent Labs Lab 01/21/16 0500 01/21/16 1300  TROPONINI <0.03 <0.03   CBG:  Recent Labs Lab 01/20/16 1733 01/20/16 2313 01/21/16 1525 01/21/16 2030 01/22/16 0631  GLUCAP 170* 147* 110* 157* 129*   Recent Results (from the past 240 hour(s))  MRSA PCR Screening     Status: None   Collection Time: 01/17/16  3:34 AM  Result Value Ref Range Status   MRSA by PCR NEGATIVE NEGATIVE Final    Comment:        The GeneXpert MRSA Assay (FDA approved for NASAL specimens only), is one component of a comprehensive MRSA colonization surveillance program. It is not intended to diagnose MRSA infection nor to guide or monitor treatment for MRSA infections.     Radiology Studies: No results found.  Scheduled Meds: . allopurinol  300 mg Oral Daily  . apixaban  10 mg Oral BID   Followed by  . [START ON 01/30/2016] apixaban  5 mg Oral BID  . ferrous sulfate  325 mg Oral BID WC  . furosemide  20 mg Oral Daily  . isosorbide mononitrate  30 mg Oral Daily  . mouth rinse  15 mL Mouth Rinse BID  . metoprolol succinate  25 mg Oral Daily  .  morphine injection  4 mg Intravenous Once  . pantoprazole  40 mg Oral BID  . polyethylene glycol  17 g Oral Daily   Continuous Infusions: . lactated ringers 10 mL/hr at 01/21/16 1400   Costin M. Cruzita Lederer, MD [facility] [phone]  If 7PM-7AM, please contact night-coverage [email or web address] Password [facility] 01/25/2016, 10:47 AM

## 2016-01-26 ENCOUNTER — Ambulatory Visit: Payer: Medicare Other | Admitting: Physician Assistant

## 2016-01-26 DIAGNOSIS — Z23 Encounter for immunization: Secondary | ICD-10-CM | POA: Diagnosis not present

## 2016-01-26 LAB — CBC
HCT: 24.9 % — ABNORMAL LOW (ref 39.0–52.0)
HEMOGLOBIN: 8.2 g/dL — AB (ref 13.0–17.0)
MCH: 31.5 pg (ref 26.0–34.0)
MCHC: 32.9 g/dL (ref 30.0–36.0)
MCV: 95.8 fL (ref 78.0–100.0)
PLATELETS: 163 10*3/uL (ref 150–400)
RBC: 2.6 MIL/uL — ABNORMAL LOW (ref 4.22–5.81)
RDW: 16.9 % — ABNORMAL HIGH (ref 11.5–15.5)
WBC: 4 10*3/uL (ref 4.0–10.5)

## 2016-01-26 MED ORDER — OXYCODONE HCL 5 MG PO TABS: 5.0000 mg | ORAL_TABLET | ORAL | 0 refills | Status: DC | PRN

## 2016-01-26 MED ORDER — APIXABAN 5 MG PO TABS: 10.0000 mg | ORAL_TABLET | Freq: Two times a day (BID) | ORAL | 0 refills | Status: DC

## 2016-01-26 MED ORDER — FERROUS SULFATE 325 (65 FE) MG PO TABS: 325.0000 mg | ORAL_TABLET | Freq: Two times a day (BID) | ORAL | 0 refills | Status: DC

## 2016-01-26 MED ORDER — APIXABAN 5 MG PO TABS
10.0000 mg | ORAL_TABLET | Freq: Two times a day (BID) | ORAL | 0 refills | Status: DC
Start: 2016-01-26 — End: 2016-01-26

## 2016-01-26 NOTE — Care Management Note (Signed)
Case Management Note Previous CM note initiated by Sherrilyn Rist [phone] 01/18/2016, 3:27 PM   Patient Details  Name: [name] MRN: [id number] Date of Birth: [birth year]  Subjective/Objective:    Admitted with CHF                Action/Plan: Patient lives at home with his daughter [name], she takes him to all of his apts. PCP is Dr Maudie Mercury; has private insurance with [facility] with prescription drug coverage; pharmacy of choice is [facility]. Pt gave CM permission to talk to his daughter via phone - she picks up all of his medication, he has no problem getting his medication. She cooks meals without any salt, heart healthy diet. He has scales and weighs himself twice a week and the reading goes to someone with [facility] / Disease Management Program. [name] stated that he doesn't weigh himself daily due to kittens in the home sleep on the scale. DME - home oxygen through [facility], no other DME at this time. CM will continue to follow for DCP.  Expected Discharge Date:    01/26/16  Expected Discharge Plan:  [place]  In-House Referral:   [facility]  Discharge planning Services  CM Consult, Medication Assistance   Discharge planning Services  CM Consult, Medication Assistance  Post Acute Care Choice:  Home Health Choice offered to:  Patient  DME Arranged:  N/A DME Agency:  NA  HH Arranged:  PT [place] Agency:  [place]  Status of Service:  Completed, signed off  If discussed at [place] of Stay Meetings, dates discussed:  11/16  Discharge Disposition: home with home health   Additional Comments:  01/26/16- 1100- [name] RN, CM-  Pt for d/c home today with daughter- order for HHPT- spoke with pt at bedside- choice offered for [facility] services in [facility] pt states that he would like to use [facility] for [facility] services- per pt he already has all needed DME at home- pt also has been started on Eliquis-  per insurance check --S/W  [name] @ [facility] # [phone]    ELIQUIS  5 MG BID   COVER- YES  CO-PAY- $ 97.67  TIER- [address] [place] # [phone]  PHARMACY : [facility]  MAIL-ORDER FOR 90 DAY SUPPLY $ 275.18   30 day free card given to pt to use on discharge- pt aware that he will need to have PCP pre-approve prior to next months fill- referral for HHPT called to Santiago Glad with [facility] per pt choice-   Dahlia Client, Romeo Rabon, RN 01/26/2016, 10:58 AM [phone]

## 2016-01-26 NOTE — Progress Notes (Signed)
[name] 9:18 AM  Subjective: Patient without signs of bleeding and no GI complaints and eating fine and no pain and he thinks he will be going home today  Objective: Vital signs stable afebrile no acute distress abdomen is soft nontender hemoglobin stable  Assessment: Chronic anemia questionable etiology  Plan: Either myself or Dr. Amedeo Plenty happy to see back as an outpatient particularly if primary care believes he requires repeat workup and please call us if we could be of any further assistance with this hospital stay  [facility] E  Pager [phone] After 5PM or if no answer call [phone]

## 2016-01-26 NOTE — Progress Notes (Signed)
01/26/2016 12:55 PM Discharge AVS meds taken today and those due this evening reviewed.  Follow-up appointments and when to call md reviewed.  D/C IV and TELE.  Questions and concerns addressed.   D/C home per orders. [place]

## 2016-01-26 NOTE — Discharge Summary (Signed)
Physician Discharge Summary  [name] XTK:[id number] DOB: 05/13/1932 DOA: 01/20/2016  PCP: [name] Gravel, MD  Admit date: 01/20/2016 Discharge date: 01/26/2016  Admitted From: 01/20/2016 Disposition:  01/26/2016  Recommendations for Outpatient Follow-up:  1. Follow up with PCP in 1-2 weeks 2. Please obtain BMP/CBC in one week 3. Folow up with Dr. Donzetta Matters as scheduled   Home Health: PT Equipment/Devices: walker  Discharge Condition: Stable CODE STATUS: DNR Diet recommendation: Heart healthy  HPI: Per Dr. Vista [name], 82 y.o.malewith history of systolic CHF, CAD s/pCABG, COPD on 2.5L [place] at baseline, atrial fibrillation, CKD 3, HTN, DM, PPM, and lung andprostate cancers who was discharged from the hospital 11/10 after presenting on11/08/2015 withdyspnea, orthopnea, leg swelling and 7 lbs weight gain. CXR showedcardiomegaly, small left pleural effusion, elevatedBNP of1720 and Troponin of0.03, withwithv-paced rhythm on EKG. His hemoglobin was also noted to haveacutely dropped without any obvious source. He was admitted for CHF exacerbation and symptomatic anemia, for which reason he wastakenoff Xarelto since 11/07/2017due to suspected GI bleed.  He was treated in the hospital with diuresis. GI consultant saw himand felt nonoperative therapy was warranted and recommended 2 months of PPI therapy. His overall medical condition improved and was discharged in stable condition on 01/20/2016 with plans to re-start Xarelto that day. Hisdaughter was driving him home and stoppedby a pharmacy to pick up his prescriptions, and he complained ofacute onset of right leg pain with some weakness andnumbness.  Neurology was consulted for suspected stroke, and imaging studies which were negative. Color change to the affected foot was noted and Vascular Surgery was consulted. He was noted to have right lower extremity acute ischemia and taken to the OR emergently for thrombectomy.  Patient is now on eliquis, switched from [place] due to renal function. On 01/25/16 creatinine is 1.38, hemoglobin stable at 8.2, fecal occult negative.   Hospital Course: Discharge Diagnoses:  Active Problems:   Prostate cancer, recur risk not determined whether low, med or high (HCC)   Anemia   Acute on chronic systolic CHF (congestive heart failure) (HCC)   Arterial thrombosis (HCC)   DM2 (diabetes mellitus, type 2) (HCC)   Critical lower limb ischemia   Ischemic leg  ACUTE CRITICAL RIGHT LIMB ISCHEMIA - vascular surgery consulted and patient underwent emergent thrombectomy on 11/10, improving well post op, on the day of discharge he was evaluated and was deemed stable for discharge per Vascular Surgery from their standpoint - Was on IV heparin, transitioned to xarelto 11-12 ---changed to eliquis 11-13 due to renal function, remained stable, Hb stable, was feeling back to baseline and was discharged home in stable condition with close outpatient follow up. Normocytic Anemia - Recent presumed GI bleed plus operative blood loss, however FOBT was negative. Hb has been stable at 8.2 and patient was clinically without any evidence of GI bleeding.  - GI consulted, no further work up. Follow up as outpatient for repeat GI workup if indicated DM2 - Well-controlled at the present time - A1c 5.4% Aug 2017 CAD s/p CABG 2004 - Asymptomatic, continue home medications  AFib s/p ACID - CHADS-Vasc score of at least 5  - On Eliquis as above Chronic Systolic CHF - TTE 9924 w/ EF 20-25% - EDW ~125lb (recent d/c wgt 124lb) - no ACEi/ARB due to CKD - Stable at this time, patient is euvolemic   Discharge Instructions     Medication List    STOP taking these medications   Rivaroxaban 15 MG Tabs tablet Commonly known as:  XARELTO     TAKE these medications   acetaminophen 500 MG tablet Commonly known as:  TYLENOL Take 500 mg by mouth every 6 (six) hours as needed (pain).   albuterol (2.5  MG/3ML) 0.083% nebulizer solution Commonly known as:  PROVENTIL Take 2.5 mg by nebulization every 6 (six) hours as needed for wheezing or shortness of breath.   albuterol 108 (90 Base) MCG/ACT inhaler Commonly known as:  PROVENTIL HFA;VENTOLIN HFA Inhale 2 puffs into the lungs every 4 (four) hours as needed for wheezing or shortness of breath.   allopurinol 300 MG tablet Commonly known as:  ZYLOPRIM Take 300 mg by mouth daily.   apixaban 5 MG Tabs tablet Commonly known as:  ELIQUIS Take 2 tablets (10 mg total) by mouth 2 (two) times daily. For 3 more days. On Monday 01/30/16 take 5 mg twice daily   benzonatate 100 MG capsule Commonly known as:  TESSALON Take 1 capsule (100 mg total) by mouth every 8 (eight) hours.   CALCIUM-VITAMIN D PO Take 1 tablet by mouth 2 (two) times daily.   ferrous sulfate 325 (65 FE) MG tablet Take 1 tablet (325 mg total) by mouth 2 (two) times daily with a meal.   Fish Oil 1000 MG Caps Take 1,000 mg by mouth at bedtime.   furosemide 20 MG tablet Commonly known as:  LASIX Take 20 mg by mouth daily.   guaifenesin 100 MG/5ML syrup Commonly known as:  ROBITUSSIN Take 200 mg by mouth 3 (three) times daily as needed for cough.   hydrocortisone cream 1 % Apply to affected area 2 times daily What changed:  how much to take  how to take this  when to take this  reasons to take this  additional instructions   isosorbide mononitrate 30 MG 24 hr tablet Commonly known as:  IMDUR Take 30 mg by mouth daily.   metoprolol succinate 25 MG 24 hr tablet Commonly known as:  TOPROL-XL Take 25 mg by mouth daily.   nitroGLYCERIN 0.4 MG SL tablet Commonly known as:  NITROSTAT Place 0.4 mg under the tongue every 5 (five) minutes as needed for chest pain.   omeprazole 40 MG capsule Commonly known as:  PRILOSEC Take 1 capsule (40 mg total) by mouth 2 (two) times daily.   oxyCODONE 5 MG immediate release tablet Commonly known as:  Oxy  IR/ROXICODONE Take 1 tablet (5 mg total) by mouth every 4 (four) hours as needed for moderate pain.   OXYGEN Inhale 2.5 L into the lungs continuous.   spironolactone 25 MG tablet Commonly known as:  ALDACTONE Take 0.5 tablets (12.5 mg total) by mouth daily.   vitamin B-12 1000 MCG tablet Commonly known as:  CYANOCOBALAMIN Take 1,000 mcg by mouth 2 (two) times daily.      Follow-up Information    Servando Snare, MD Follow up in 2 week(s).   Specialties:  Vascular Surgery, Cardiology Why:  Our office will call you to arrange an appointment  Contact information: [place] [address] [phone]        [name] Gravel, MD. Schedule an appointment as soon as possible for a visit in 2 week(s).   Specialty:  Internal Medicine Contact information: [address] [place] [address] [phone]        [facility] Follow up.   Why:  HHPT arranged- they will call you to set up home visits Contact information: [address] [place] [address] [phone]  Allergies  Allergen Reactions  . Aspirin Hives  . Penicillins Rash    Rash Has patient had a PCN reaction causing immediate rash, facial/tongue/throat swelling, SOB or lightheadedness with hypotension:YES Has patient had a PCN reaction causing severe rash involving mucus membranes or skin necrosis: NO Has patient had a PCN reaction that required hospitalization NO Has patient had a PCN reaction occurring within the last 10 years:NO If all of the above answers are "NO", then may proceed with Cephalosporin use.    Consultations:  Cardiology  Gastroenterology  Procedures/Studies: Right lower extremity thromboembolectomy  Dg Chest 2 View  Result Date: 01/16/2016 CLINICAL DATA:  Central and left chest pain for the past 2 days. Ex-smoker. EXAM: CHEST  2 VIEW COMPARISON:  10/26/2015. FINDINGS: Mildly progressive enlargement of the cardiac silhouette.  Stable post CABG changes and left subclavian AICD and pacemaker leads. A right subclavian pacemaker lead is also unchanged. Prominence of the interstitial markings and right mid lung zone scarring are unchanged. Small left pleural effusion without significant change. Thoracolumbar spine degenerative changes. IMPRESSION: 1. Mildly progressive cardiomegaly. 2. Stable small left pleural effusion. 3. Stable interstitial fibrosis and right lung scarring. Electronically Signed   By: Claudie Revering M.D.   On: 01/16/2016 15:13   Ct Abdomen Pelvis W Contrast  Result Date: 01/18/2016 CLINICAL DATA:  Colon cancer.  History of gallstones and hematuria. EXAM: CT ABDOMEN AND PELVIS WITH CONTRAST TECHNIQUE: Multidetector CT imaging of the abdomen and pelvis was performed using the standard protocol following bolus administration of intravenous contrast. CONTRAST:  87m ISOVUE-300 IOPAMIDOL (ISOVUE-300) INJECTION 61% COMPARISON:  11/10/2014 FINDINGS: Lower chest: Small right greater than left pleural effusions. Calcified pleural plaque on the left posteriorly. Stable cardiomegaly with right atrial, coronary sinus and right ventricular leads noted. Chronic rounded atelectasis at the left lung base posteriorly. Bibasilar dependent and subsegmental atelectasis. Hepatobiliary: Gallbladder is contracted. No gallstones are seen. No space-occupying mass of liver. No ductal dilatation. Pancreas: No pancreatic ductal dilatation or focal mass. Spleen: Normal Adrenals/Urinary Tract: Normal adrenal glands. Right lower pole nonobstructing 8.5 mm renal stone. Stable left lower pole 3 cm exophytic renal cyst with post contrast Hounsfield unit of 29 unchanged from prior. Mural nodular density suggested on prior exam is not as apparent on this exam. This exophytic cyst again demonstrates peripheral thin mural calcifications. Stomach/Bowel: The stomach is distended with contrast and fluid. There is no bowel obstruction or acute inflammatory  process involving small nor large bowel. There is mild diffuse distention of small intestine approximately, nonspecific in etiology. Mild enteritis is not excluded. Circular muscle hypertrophy and sigmoid diverticulosis noted as before. Vascular/Lymphatic: Aortoiliac and branch vessel atherosclerosis without aneurysm or dissection. No lymphadenopathy. Reproductive: Radiation seeds noted of the normal size prostate. Other: No free air or free fluid. Musculoskeletal: Schmorl's node involving the superior endplate of L4 with degenerative disc disease L2-3 and L4-5. Multilevel osteophytes most prominent from T9 through T12. IMPRESSION: There are a few mildly distended fluid filled small bowel loops without evidence of acute inflammation or bowel obstruction. Mild ileus or enteritis could have this appearance of nonspecific. Sigmoid diverticulosis with circular muscle hypertrophy. Complex lower pole 3 cm exophytic cyst with thin peripheral calcifications suggesting a Bosniak category 2 renal cyst. Nonobstructing stable right lower pole 8.5 mm renal calculus. New small bilateral pleural effusions. Left-sided calcified pleural plaque consistent with prior asbestos exposure. Electronically Signed   By: DAshley RoyaltyM.D.   On: 01/18/2016 02:04   Ct Head Code Stroke Wo Contrast`  Addendum Date: 01/20/2016   ADDENDUM REPORT: 01/20/2016 17:39 ADDENDUM: These results were called by telephone at the time of interpretation on 01/20/2016 at 5:37 pm to Dr. Leonel Ramsay, who verbally acknowledged these results. Electronically Signed   By: Nelson Chimes M.D.   On: 01/20/2016 17:39   Result Date: 01/20/2016 CLINICAL DATA:  Code stroke.  Code stroke.  Right leg numbness. EXAM: CT HEAD WITHOUT CONTRAST TECHNIQUE: Contiguous axial images were obtained from the base of the skull through the vertex without intravenous contrast. COMPARISON:  06/04/2013.  02/15/2013. FINDINGS: Brain: Generalized brain atrophy. Old small vessel infarction  inferior cerebellum on the right. Chronic small-vessel ischemic change of the hemispheric white matter. No evidence of acute infarction, mass lesion, hemorrhage, hydrocephalus or extra-axial collection. Vascular: There is atherosclerotic calcification of the major vessels at the base of the brain. Skull: Negative Sinuses/Orbits: Clear/normal Other: None significant ASPECTS ([place] Stroke Program Early CT Score) - Ganglionic level infarction (caudate, lentiform nuclei, internal capsule, insula, M1-M3 cortex): 7 - Supraganglionic infarction (M4-M6 cortex): 3 Total score (0-10 with 10 being normal): 10 IMPRESSION: 1. Atrophy. No acute finding. Old small vessel infarction inferior cerebellum on the right. 2. ASPECTS is 10 Call report in process. Electronically Signed: By: Nelson Chimes M.D. On: 01/20/2016 17:34      Subjective: Patient is doing well. Continues to have burning, stinging leg pain. Denies dyspnea, cough, fever, chills, abdominal pain, edema.   Discharge Exam: Vitals:   01/26/16 0525 01/26/16 1049  BP: (!) 104/52 (!) 117/53  Pulse: 63 63  Resp: 18   Temp: 97.5 F (36.4 C)    Vitals:   01/25/16 1350 01/25/16 1953 01/26/16 0525 01/26/16 1049  BP: (!) 107/58 (!) 117/54 (!) 104/52 (!) 117/53  Pulse: 64 62 63 63  Resp: '17 18 18   '$ Temp: 98 F (36.7 C) 98.2 F (36.8 C) 97.5 F (36.4 C)   TempSrc: Oral Oral Oral   SpO2: 100% 100% 100%   Weight:      Height:        General: Pt is alert, awake, not in acute distress. On 2.5L oxygen. Cardiovascular: RRR, S1/S2 +, no rubs, no gallops Respiratory: CTA bilaterally, no wheezing, no rhonchi Abdominal: Soft, NT, ND Extremities: no edema, no cyanosis. Surgical incision with surrounding mild ecchymosis, without significant erythema / drainage, healing well    The results of significant diagnostics from this hospitalization (including imaging, microbiology, ancillary and laboratory) are listed below for reference.      Microbiology: Recent Results (from the past 240 hour(s))  MRSA PCR Screening     Status: None   Collection Time: 01/17/16  3:34 AM  Result Value Ref Range Status   MRSA by PCR NEGATIVE NEGATIVE Final    Comment:        The GeneXpert MRSA Assay (FDA approved for NASAL specimens only), is one component of a comprehensive MRSA colonization surveillance program. It is not intended to diagnose MRSA infection nor to guide or monitor treatment for MRSA infections.      Labs: BNP (last 3 results)  Recent Labs  01/16/16 1425 01/19/16 0304 01/21/16 0505  BNP 1,720.1* 680.1* 1,884.1*   Basic Metabolic Panel:  Recent Labs Lab 01/21/16 0500 01/22/16 0236 01/23/16 1016 01/24/16 0200 01/25/16 0256  NA 134* 133* 133* 131* 133*  K 4.1 4.8 4.2 4.4 4.8  CL 99* 98* 99* 98* 97*  CO2 '25 26 24 24 26  '$ GLUCOSE 87 124* 142* 94 104*  BUN 31* 28* 31*  31* 23*  CREATININE 1.15 1.48* 1.69* 1.44* 1.38*  CALCIUM 8.4* 8.6* 8.3* 8.2* 8.6*   Liver Function Tests:  Recent Labs Lab 01/20/16 1735 01/21/16 0500 01/22/16 0236  AST 33 20 25  ALT '25 21 20  '$ ALKPHOS 112 83 74  BILITOT 1.5* 1.0 1.4*  PROT 7.1 6.6 6.2*  ALBUMIN 3.6 3.3* 3.1*   No results for input(s): LIPASE, AMYLASE in the last 168 hours. No results for input(s): AMMONIA in the last 168 hours. CBC:  Recent Labs Lab 01/20/16 1735  01/22/16 0236 01/23/16 1016 01/24/16 0200 01/24/16 1156 01/25/16 0256 01/26/16 0232  WBC 4.8  < > 3.6* 4.0 4.9  --  4.3 4.0  NEUTROABS 2.1  --   --   --   --   --   --   --   HGB 10.7*  < > 9.1* 8.0* 7.8* 8.5* 8.2* 8.2*  HCT 32.4*  < > 27.6* 23.9* 23.4* 26.2* 24.1* 24.9*  MCV 95.9  < > 95.8 96.4 95.9  --  96.8 95.8  PLT 188  < > 149* 131* 129*  --  154 163  < > = values in this interval not displayed. Cardiac Enzymes:  Recent Labs Lab 01/21/16 0500 01/21/16 1300  TROPONINI <0.03 <0.03   BNP: Invalid input(s): POCBNP CBG:  Recent Labs Lab 01/20/16 1733 01/20/16 2313  01/21/16 1525 01/21/16 2030 01/22/16 0631  GLUCAP 170* 147* 110* 157* 129*   D-Dimer No results for input(s): DDIMER in the last 72 hours. Hgb A1c No results for input(s): HGBA1C in the last 72 hours. Lipid Profile No results for input(s): CHOL, HDL, LDLCALC, TRIG, CHOLHDL, LDLDIRECT in the last 72 hours. Thyroid function studies No results for input(s): TSH, T4TOTAL, T3FREE, THYROIDAB in the last 72 hours.  Invalid input(s): FREET3 Anemia work up No results for input(s): VITAMINB12, FOLATE, FERRITIN, TIBC, IRON, RETICCTPCT in the last 72 hours. Urinalysis    Component Value Date/Time   COLORURINE YELLOW 01/21/2016 0114   APPEARANCEUR CLEAR 01/21/2016 0114   LABSPEC 1.012 01/21/2016 0114   LABSPEC 1.015 05/03/2011 0950   PHURINE 6.0 01/21/2016 0114   GLUCOSEU NEGATIVE 01/21/2016 0114   HGBUR SMALL (A) 01/21/2016 0114   BILIRUBINUR NEGATIVE 01/21/2016 0114   BILIRUBINUR Color Interference 05/03/2011 0950   KETONESUR NEGATIVE 01/21/2016 0114   PROTEINUR NEGATIVE 01/21/2016 0114   UROBILINOGEN 0.2 11/10/2014 1815   NITRITE NEGATIVE 01/21/2016 0114   LEUKOCYTESUR NEGATIVE 01/21/2016 0114   LEUKOCYTESUR Color Interference 05/03/2011 0950   Sepsis Labs Invalid input(s): PROCALCITONIN,  WBC,  LACTICIDVEN Microbiology Recent Results (from the past 240 hour(s))  MRSA PCR Screening     Status: None   Collection Time: 01/17/16  3:34 AM  Result Value Ref Range Status   MRSA by PCR NEGATIVE NEGATIVE Final    Comment:        The GeneXpert MRSA Assay (FDA approved for NASAL specimens only), is one component of a comprehensive MRSA colonization surveillance program. It is not intended to diagnose MRSA infection nor to guide or monitor treatment for MRSA infections.      Time coordinating discharge: Over 30 minutes  SIGNED:  Chevelle Coulson M. Cruzita Lederer, MD [facility] 01/26/2016, 12:35 PM Pager [phone]  If 7PM-7AM, please contact  night-coverage [email or web address] Password [email or web address]

## 2016-01-26 NOTE — Progress Notes (Addendum)
  Progress Note    01/26/2016 7:51 AM 6 Days Post-Op  Subjective:  C/o of numbness/tingling in his incision.  Afebrile HR  60's 438'O-875'Z systolic 972% 8AS6OR  Vitals:   01/25/16 1953 01/26/16 0525  BP: (!) 117/54 (!) 104/52  Pulse: 62 63  Resp: 18 18  Temp: 98.2 F (36.8 C) 97.5 F (36.4 C)    Physical Exam: Cardiac:  regular Lungs:  Non labored Incisions:  Clean and dry without drainage; there is ecchymosis around the incision Extremities:  Right foot is warm and well perfused; there are brisk doppler signals right PT/DP; motor is in tact.   CBC    Component Value Date/Time   WBC 4.0 01/26/2016 0232   RBC 2.60 (L) 01/26/2016 0232   HGB 8.2 (L) 01/26/2016 0232   HCT 24.9 (L) 01/26/2016 0232   PLT 163 01/26/2016 0232   MCV 95.8 01/26/2016 0232   MCH 31.5 01/26/2016 0232   MCHC 32.9 01/26/2016 0232   RDW 16.9 (H) 01/26/2016 0232   LYMPHSABS 2.2 01/20/2016 1735   MONOABS 0.5 01/20/2016 1735   EOSABS 0.1 01/20/2016 1735   BASOSABS 0.0 01/20/2016 1735    BMET    Component Value Date/Time   NA 133 (L) 01/25/2016 0256   K 4.8 01/25/2016 0256   CL 97 (L) 01/25/2016 0256   CO2 26 01/25/2016 0256   GLUCOSE 104 (H) 01/25/2016 0256   BUN 23 (H) 01/25/2016 0256   CREATININE 1.38 (H) 01/25/2016 0256   CREATININE 1.03 06/02/2013 0929   CALCIUM 8.6 (L) 01/25/2016 0256   GFRNONAA 46 (L) 01/25/2016 0256   GFRAA 54 (L) 01/25/2016 0256    INR    Component Value Date/Time   INR 1.21 01/21/2016 0500     Intake/Output Summary (Last 24 hours) at 01/26/16 0751 Last data filed at 01/26/16 0415  Gross per 24 hour  Intake              840 ml  Output             1550 ml  Net             -710 ml     Assessment:  82 y.o. male is s/p:  RLE thromboembolectomy  6 Days Post-Op  Plan: -pt with brisk right DP/PT doppler signals -hgb is stable-he is on Eliquis for Afib -GI following for anemia-does not feel like the pt needs to be worked up now and can be worked  up on an outpatient basis -renal function continues to improve -HHPT recommended for mobility -f/u with Dr. Donzetta Matters in 2 weeks.    Leontine Locket, PA-C [facility] [phone] 01/26/2016 7:51 AM   I have independently interviewed patient and agree with PA assessment and plan above.   Ceairra Mccarver C. Donzetta Matters, MD [facility] [place] Office: [phone] Pager: [phone]

## 2016-01-26 NOTE — Progress Notes (Signed)
Insurance check completed for Eliquis S/W  [name] @ [place] # [phone]    ELIQUIS  5 MG BID   COVER- YES  CO-PAY- $ 97.67  TIER- 3 DRUG  PRIOR APPROVAL- YES # [phone]  PHARMACY : [facility] AND [facility]  MAIL-ORDER FOR 90 DAY SUPPLY $ 275.18

## 2016-02-08 ENCOUNTER — Encounter: Payer: Self-pay | Admitting: Physician Assistant

## 2016-02-08 ENCOUNTER — Ambulatory Visit (INDEPENDENT_AMBULATORY_CARE_PROVIDER_SITE_OTHER): Payer: Medicare Other | Admitting: Physician Assistant

## 2016-02-08 VITALS — BP 121/66 | HR 65 | Ht 66.0 in | Wt 129.8 lb

## 2016-02-08 DIAGNOSIS — I350 Nonrheumatic aortic (valve) stenosis: Secondary | ICD-10-CM | POA: Diagnosis not present

## 2016-02-08 DIAGNOSIS — I5042 Chronic combined systolic (congestive) and diastolic (congestive) heart failure: Secondary | ICD-10-CM | POA: Diagnosis not present

## 2016-02-08 DIAGNOSIS — I4821 Permanent atrial fibrillation: Secondary | ICD-10-CM

## 2016-02-08 DIAGNOSIS — I482 Chronic atrial fibrillation: Secondary | ICD-10-CM | POA: Diagnosis not present

## 2016-02-08 DIAGNOSIS — I4891 Unspecified atrial fibrillation: Secondary | ICD-10-CM | POA: Diagnosis not present

## 2016-02-08 LAB — CBC
HEMATOCRIT: 25.3 % — AB (ref 38.5–50.0)
HEMOGLOBIN: 8.3 g/dL — AB (ref 13.2–17.1)
MCH: 32.8 pg (ref 27.0–33.0)
MCHC: 32.8 g/dL (ref 32.0–36.0)
MCV: 100 fL (ref 80.0–100.0)
MPV: 9.7 fL (ref 7.5–12.5)
PLATELETS: 204 10*3/uL (ref 140–400)
RBC: 2.53 MIL/uL — AB (ref 4.20–5.80)
RDW: 18.8 % — ABNORMAL HIGH (ref 11.0–15.0)
WBC: 3 10*3/uL — ABNORMAL LOW (ref 3.8–10.8)

## 2016-02-08 LAB — BASIC METABOLIC PANEL
BUN: 18 mg/dL (ref 7–25)
CALCIUM: 8.8 mg/dL (ref 8.6–10.3)
CHLORIDE: 99 mmol/L (ref 98–110)
CO2: 25 mmol/L (ref 20–31)
CREATININE: 1.28 mg/dL — AB (ref 0.70–1.11)
GLUCOSE: 99 mg/dL (ref 65–99)
Potassium: 4.1 mmol/L (ref 3.5–5.3)
Sodium: 136 mmol/L (ref 135–146)

## 2016-02-08 NOTE — Progress Notes (Signed)
Cardiology Office Note   Date:  02/08/2016   ID:  [name], DOB December 29, 1932, MRN [id number]  PCP:  [name], MD  Cardiologist:  Will be Dr Percival Spanish for general cardiology (saw him in hospital) EP: Dr Brayton Mars, PA-C   Chief Complaint  Patient presents with  . Hospitalization Follow-up    History of Present Illness: [name] is a 82 y.o. male with a history of S-CHF, CABG, COPD with chronic respiratory failure (on 2.5L [place] at baseline), atrial fibrillation, CKD 3, HTN, DM, [employer] BiV-PPM 2015 (former Bi-V ICD), and lung andprostate cancers.  Admit 11/06-11/10 for CHF, Xarelto d/c'd for ?GIB>>restarted at d/c Admit 11/10-11/16 for LE ischemia s/p thrombectomy, Xarelto>>Eliquis for decreased renal function  [name] presents for post-hospital f/u.  Since d/c, he has been doing well. He has to climb a steep hill to get out of the house, but did well (an improvement). He has been walking around in the house, just had not been out since d/c because of the cold. He has not had chest pain. His DOE is at baseline. Denies orthopnea or PND.  He has minimal RLE edema, none on the Left. His incision is healing well, no drainage, fever or chills. He weighs himself daily, the scale is electronically connected so he gets a call if his weight changes much.   He gets light-headed at times with getting out of bed or other position change. This resolves in a few seconds, he has not felt in danger of losing consciousness or falling.    Past Medical History:  Diagnosis Date  . Arthritis   . Black lung disease ([name])   . CAD (coronary artery disease)    a. 07/2002 CABG x 3: LIMA->LAD, VG->Diag, VG->OM;  b. 06/2006 Cath: LM 50-60ost/p, LAD patent mid stent, D1 sev dzs, D2 patent stent, LCX nl, OM2 sev sten prox, RCA large/nl, VG->Diag nl, VG->OM nl, LIMA->LAD atretic, EF 30%.  . Carotid artery occlusion   . Chronic atrial fibrillation ([name])   . Chronic respiratory  failure ([place])   . Chronic systolic CHF (congestive heart failure) ([place])    a. 12/2012 Echo: EF 20-25%, mid-dist antsept AK, mod dil LA.  [name] COPD (chronic obstructive pulmonary disease) ([place])    a. On home O2.  [name] CVA (cerebral vascular accident) ([place])   . Diabetes mellitus   . GERD (gastroesophageal reflux disease)   . Gout   . Hypercholesterolemia   . Hypertension   . Ischemic cardiomyopathy    a. 06/2006  BIV-ICD -> gen change 2015 to [place].  . Lung cancer ([place])   . Nephrolithiasis 09/2000  . On home oxygen therapy    "2.5L; 24/7" (01/16/2016)  . Prostate cancer ([place]) 11/01/10   gleason 7, 8, 9, gold seeds 02/08/11  . Symptomatic bradycardia     Past Surgical History:  Procedure Laterality Date  . BI-VENTRICULAR PACEMAKER INSERTION (CRT-P)  12-02-13   downgrade of previously implanted CRTD to STJ CRTP  . BIV PACEMAKER GENERATOR CHANGE OUT N/A 12/02/2013   Procedure: BIV PACEMAKER GENERATOR CHANGE OUT;  Surgeon: Evans Lance, MD;  Location: [facility] CATH LAB;  Service: Cardiovascular;  Laterality: N/A;  . COLONOSCOPY    . CORONARY ANGIOPLASTY WITH STENT PLACEMENT  07/1997; 08/1997;03/1998  . CORONARY ARTERY BYPASS GRAFT  07/2002   CABG X3  . ESOPHAGOGASTRODUODENOSCOPY  02/19/2011   Procedure: ESOPHAGOGASTRODUODENOSCOPY (EGD);  Surgeon: Jeryl Columbia, MD;  Location: [facility]  ENDOSCOPY;  Service: Endoscopy;  Laterality: N/A;  . FEMORAL-POPLITEAL BYPASS GRAFT Right 01/20/2016   Procedure: THROMBECTOMY OF POPLITEAL ARTERY AND ANTERIOR TIBIAL ARTERY RIGHT LEG; INTRAOPERATIVE ARTERIOGRAM;  Surgeon: Waynetta Sandy, MD;  Location: [facility];  Service: Vascular;  Laterality: Right;  . INCISION AND DRAINAGE OF WOUND  08/2002   right thigh; S/P EVH  . INSERT / REPLACE / [place]; 1992; 01/2000;  . INSERT / REPLACE / REMOVE PACEMAKER  09/2003; 06/2006   w/AICD  . INSERT / REPLACE / REMOVE PACEMAKER  12/2004   pacmaker explant  . SHOULDER ARTHROSCOPY W/ ROTATOR CUFF REPAIR  05/2008     left  . upper endoscpopy      Medication Sig  . acetaminophen (TYLENOL) 500 MG tablet Take 500 mg by mouth every 6 (six) hours as needed (pain).  [name] albuterol (PROVENTIL HFA;VENTOLIN HFA) 108 (90 Base) MCG/ACT inhaler Inhale 2 puffs into the lungs every 4 (four) hours as needed for wheezing or shortness of breath.  [name] albuterol (PROVENTIL) (2.5 MG/3ML) 0.083% nebulizer solution Take 2.5 mg by nebulization every 6 (six) hours as needed for wheezing or shortness of breath.  . allopurinol (ZYLOPRIM) 300 MG tablet Take 300 mg by mouth daily.    [name] apixaban (ELIQUIS) 5 MG TABS tablet Take 2 tablets (10 mg total) by mouth 2 (two) times daily. For 3 more days. On Monday 01/30/16 take 5 mg twice daily  . benzonatate (TESSALON) 100 MG capsule Take 1 capsule (100 mg total) by mouth every 8 (eight) hours.  [name] CALCIUM-VITAMIN D PO Take 1 tablet by mouth 2 (two) times daily.   [name] DIGITEK 125 MCG tablet Take 1 tablet by mouth daily.  . ferrous sulfate 325 (65 FE) MG tablet Take 1 tablet (325 mg total) by mouth 2 (two) times daily with a meal.  . furosemide (LASIX) 20 MG tablet Take 20 mg by mouth daily.  [name] guaifenesin (ROBITUSSIN) 100 MG/5ML syrup Take 200 mg by mouth 3 (three) times daily as needed for cough.  . hydrocortisone cream 1 % Apply to affected area 2 times daily (Patient taking differently: Apply 1 application topically 2 (two) times daily as needed for itching. Apply to affected area 2 times daily)  . isosorbide mononitrate (IMDUR) 30 MG 24 hr tablet Take 30 mg by mouth daily.  . metoprolol succinate (TOPROL-XL) 25 MG 24 hr tablet Take 25 mg by mouth daily.  . nitroGLYCERIN (NITROSTAT) 0.4 MG SL tablet Place 0.4 mg under the tongue every 5 (five) minutes as needed for chest pain.  . Omega-3 Fatty Acids (FISH OIL) 1000 MG CAPS Take 1,000 mg by mouth at bedtime.   [name] omeprazole (PRILOSEC) 40 MG capsule Take 1 capsule (40 mg total) by mouth 2 (two) times daily.  [name] oxyCODONE (OXY IR/ROXICODONE) 5 MG  immediate release tablet Take 1 tablet (5 mg total) by mouth every 4 (four) hours as needed for moderate pain.  . OXYGEN Inhale 2.5 L into the lungs continuous.  [name] spironolactone (ALDACTONE) 25 MG tablet Take 0.5 tablets (12.5 mg total) by mouth daily.  . vitamin B-12 (CYANOCOBALAMIN) 1000 MCG tablet Take 1,000 mcg by mouth 2 (two) times daily.   No current facility-administered medications for this visit.     Allergies:   Aspirin and Penicillins    Social History:  The patient  reports that he quit smoking about 66 years ago. His smoking use included Cigarettes. His smokeless tobacco use includes Chew. He reports that he  does not drink alcohol or use drugs.   Family History:  The patient's family history includes Alzheimer's disease (age of onset: 47) in his father; Cancer (age of onset: 20) in his father; Diabetes in his brother, brother, and brother; Diabetes (age of onset: 50) in his sister.    ROS:  Please see the history of present illness. All other systems are reviewed and negative.    PHYSICAL EXAM: VS:  BP 121/66   Pulse 65   Ht '5\' 6"'$  (1.676 m)   Wt 129 lb 12.8 oz (58.9 kg)   BMI 20.95 kg/m  , BMI Body mass index is 20.95 kg/m. GEN: Well nourished, frail, elderly, male in no acute distress  HEENT: normal for age  Neck: no JVD, no carotid bruit, no masses Cardiac: RRR; soft murmur, no rubs, or gallops Respiratory:  Decreased BS bases w/ few rales bilaterally, normal work of breathing GI: soft, nontender, nondistended, + BS MS: no deformity or atrophy; minimal RLE edema; distal pulses are 2+ in all 4 extremities   Skin: warm and dry, no rash. RLE incision healing well. Neuro:  Strength and sensation are intact Psych: euthymic mood, full affect   EKG:  EKG is ordered today. The ekg ordered today demonstrates atrial fib w/ BiV pacing  ECHO: 01/18/2016 - Left ventricle: The cavity size was moderately dilated. Systolic   function was severely reduced. The estimated  ejection fraction   was in the range of 10% to 15%. Diffuse hypokinesis. - Aortic valve: A bicuspid morphology cannot be excluded;   moderately thickened, moderately calcified leaflets. Valve   mobility was restricted. There was mild stenosis. There was no   regurgitation. Peak velocity (S): 220 cm/s. Valve area (VTI): 0.7   cm^2. Valve area (Vmax): 0.79 cm^2. Valve area (Vmean): 0.66 cm^2. - Mitral valve: Transvalvular velocity was within the normal range.   There was no evidence for stenosis. There was mild regurgitation. - Left atrium: The atrium was massively dilated. - Right ventricle: The cavity size was mildly dilated. Wall   thickness was normal. Systolic function was severely reduced. - Right atrium: The atrium was severely dilated. - Tricuspid valve: There was mild regurgitation. - Pulmonary arteries: Systolic pressure was within the normal   range. PA peak pressure: 18 mm Hg (S). Impressions: - Aortic stenosis is mild by mean gradient and peak velocity.   However, this is likely underestimated due to reduced systolic   function which can produce low flow, low gradient aortic   stenosis. Consider dobutamine stress echo if clinically   indicated.   Recent Labs: 10/27/2015: TSH 4.655 01/19/2016: Magnesium 2.2 01/21/2016: B Natriuretic Peptide 1,167.5 01/22/2016: ALT 20 01/25/2016: BUN 23; Creatinine, Ser 1.38; Potassium 4.8; Sodium 133 01/26/2016: Hemoglobin 8.2; Platelets 163    Lipid Panel    Component Value Date/Time   CHOL 159 10/27/2015 0515   TRIG 245 (H) 10/27/2015 0515   HDL 18 (L) 10/27/2015 0515   CHOLHDL 8.8 10/27/2015 0515   VLDL 49 (H) 10/27/2015 0515   LDLCALC 92 10/27/2015 0515     Wt Readings from Last 3 Encounters:  02/08/16 129 lb 12.8 oz (58.9 kg)  01/25/16 124 lb 1.9 oz (56.3 kg)  01/20/16 124 lb 8 oz (56.5 kg)     Other studies Reviewed: Additional studies/ records that were reviewed today include: Hospital records, office notes and  testing.  ASSESSMENT AND PLAN:  1.  Chronic combined systolic and diastolic CHF: His weight is up on our scales, but  stable at home. Continue current therapy. Discussed adding Losartan as a way to see if he might tolerate Entresto. However, with orthostatic dizziness, family prefers not to add anything that will affect his BP.   He is doing well from a symptom standpoint. He is encouraged to continue current lifestyle. Ok to increase activity as tolerated. Continue low sodium diet and daily weights. Report weight gain. Ck BMET  2. Hx heme + stools, presumed GI bleed, on Eliquis: ck CBC  3. Mild AS: possibly underestimated on recent echo, may have severe low-output AS. Pt currently doing well from a symptom standpoint. MD to decide at f/u visit if dobut echo for TAVR consideration is needed.   Current medicines are reviewed at length with the patient today.  The patient does not have concerns regarding medicines.  The following changes have been made:  no change  Labs/ tests ordered today include:   Orders Placed This Encounter  Procedures  . CBC  . Basic metabolic panel  . EKG 12-Lead     Disposition:   FU with Dr Percival Spanish & Dr Lovena Le  Signed, Betzy Barbier, [place], PA-C  02/08/2016 10:06 AM    [place] Phone: [phone]; Fax: [phone]  This note was written with the assistance of speech recognition software. Please excuse any transcriptional errors.

## 2016-02-08 NOTE — Patient Instructions (Signed)
Medication Instructions:   NO CHANGE  Labwork:  Your physician recommends that you HAVE LAB WORK TODAY  Follow-Up:  Your physician recommends that you schedule a follow-up appointment in: [place] [facility]  Your physician recommends that you schedule a follow-up appointment WITH DR Linneus    Your physician recommends that you schedule a follow-up appointment WITH RHONDA BARRETT PA AS NEEDED  CALL WITH WEIGHT GAIN OF 3 LBS IN ONE DAY OR 5 LBS IN ONE WEEK  CALL WITH ANY DARK, TARRY STOOLS   Low-Sodium Eating Plan Sodium raises blood pressure and causes water to be held in the body. Getting less sodium from food will help lower your blood pressure, reduce any swelling, and protect your heart, liver, and kidneys. We get sodium by adding salt (sodium chloride) to food. Most of our sodium comes from canned, boxed, and frozen foods. Restaurant foods, fast foods, and pizza are also very high in sodium. Even if you take medicine to lower your blood pressure or to reduce fluid in your body, getting less sodium from your food is important. What is my plan? Most people should limit their sodium intake to 2,300 mg a day. Your health care provider recommends that you limit your sodium intake to __________ a day. What do I need to know about this eating plan? For the low-sodium eating plan, you will follow these general guidelines:  Choose foods with a % Daily Value for sodium of less than 5% (as listed on the food label).  Use salt-free seasonings or herbs instead of table salt or sea salt.  Check with your health care provider or pharmacist before using salt substitutes.  Eat fresh foods.  Eat more vegetables and fruits.  Limit canned vegetables. If you do use them, rinse them well to decrease the sodium.  Limit cheese to 1 oz (28 g) per day.  Eat lower-sodium products, often labeled as "lower sodium" or "no salt added."  Avoid foods that contain monosodium  glutamate (MSG). MSG is sometimes added to [place] food and some canned foods.  Check food labels (Nutrition Facts labels) on foods to learn how much sodium is in one serving.  Eat more home-cooked food and less restaurant, buffet, and fast food.  When eating at a restaurant, ask that your food be prepared with less salt, or no salt if possible. How do I read food labels for sodium information? The Nutrition Facts label lists the amount of sodium in one serving of the food. If you eat more than one serving, you must multiply the listed amount of sodium by the number of servings. Food labels may also identify foods as:  Sodium free-Less than 5 mg in a serving.  Very low sodium-35 mg or less in a serving.  Low sodium-140 mg or less in a serving.  Light in sodium-50% less sodium in a serving. For example, if a food that usually has 300 mg of sodium is changed to become light in sodium, it will have 150 mg of sodium.  Reduced sodium-25% less sodium in a serving. For example, if a food that usually has 400 mg of sodium is changed to reduced sodium, it will have 300 mg of sodium. What foods can I eat? Grains  Low-sodium cereals, including oats, puffed wheat and rice, and shredded wheat cereals. Low-sodium crackers. Unsalted rice and pasta. Lower-sodium bread. Vegetables  Frozen or fresh vegetables. Low-sodium or reduced-sodium canned vegetables. Low-sodium or reduced-sodium tomato sauce and paste. Low-sodium or  reduced-sodium tomato and vegetable juices. Fruits  Fresh, frozen, and canned fruit. Fruit juice. Meat and Other Protein Products  Low-sodium canned tuna and salmon. Fresh or frozen meat, poultry, seafood, and fish. Lamb. Unsalted nuts. Dried beans, peas, and lentils without added salt. Unsalted canned beans. Homemade soups without salt. Eggs. Dairy  Milk. Soy milk. Ricotta cheese. Low-sodium or reduced-sodium cheeses. Yogurt. Condiments  Fresh and dried herbs and spices. Salt-free  seasonings. Onion and garlic powders. Low-sodium varieties of mustard and ketchup. Fresh or refrigerated horseradish. Lemon juice. Fats and Oils  Reduced-sodium salad dressings. Unsalted butter. Other  Unsalted popcorn and pretzels. The items listed above may not be a complete list of recommended foods or beverages. Contact your dietitian for more options.  What foods are not recommended? Grains  Instant hot cereals. Bread stuffing, pancake, and biscuit mixes. Croutons. Seasoned rice or pasta mixes. Noodle soup cups. Boxed or frozen macaroni and cheese. Self-rising flour. Regular salted crackers. Vegetables  Regular canned vegetables. Regular canned tomato sauce and paste. Regular tomato and vegetable juices. Frozen vegetables in sauces. Salted [place] fries. Olives. Angie Fava. Relishes. Sauerkraut. Salsa. Meat and Other Protein Products  Salted, canned, smoked, spiced, or pickled meats, seafood, or fish. Bacon, ham, sausage, hot dogs, corned beef, chipped beef, and packaged luncheon meats. Salt pork. Jerky. Pickled herring. Anchovies, regular canned tuna, and sardines. Salted nuts. Dairy  Processed cheese and cheese spreads. Cheese curds. Blue cheese and cottage cheese. Buttermilk. Condiments  Onion and garlic salt, seasoned salt, table salt, and sea salt. Canned and packaged gravies. Worcestershire sauce. Tartar sauce. Barbecue sauce. Teriyaki sauce. Soy sauce, including reduced sodium. Steak sauce. Fish sauce. Oyster sauce. Cocktail sauce. Horseradish that you find on the shelf. Regular ketchup and mustard. Meat flavorings and tenderizers. Bouillon cubes. Hot sauce. Tabasco sauce. Marinades. Taco seasonings. Relishes. Fats and Oils  Regular salad dressings. Salted butter. Margarine. Ghee. Bacon fat. Other  Potato and tortilla chips. Corn chips and puffs. Salted popcorn and pretzels. Canned or dried soups. Pizza. Frozen entrees and pot pies. The items listed above may not be a complete list of  foods and beverages to avoid. Contact your dietitian for more information.  This information is not intended to replace advice given to you by your health care provider. Make sure you discuss any questions you have with your health care provider. Document Released: 08/18/2001 Document Revised: 08/04/2015 Document Reviewed: 12/31/2012 Elsevier Interactive Patient Education  2017 [employer].

## 2016-02-13 ENCOUNTER — Other Ambulatory Visit: Payer: Self-pay

## 2016-02-13 NOTE — Patient Outreach (Signed)
[facility]) Care Management  02/13/2016  TYMON NEMETZ May 02, 1932 [id number]  REFERRAL DATE; 02/13/16 REFERRAL SOURCE: EMMI heart failure REFERRAL REASON:  EMMI heart failiure red alert: Day #15 call:  Weighed themselves today: "NO"  Telephone call to patient regarding EMMI heart failure red alert.  Contact answering call states patient is not at home.  HIPAA compliant voice message left with call back phone number.   PLAN:  RNCM will attempt 2nd telephone outreach to patient within 1 week.  Quinn Plowman RN,BSN,CCM [facility] Telephonic  [phone]

## 2016-02-14 ENCOUNTER — Other Ambulatory Visit: Payer: Self-pay

## 2016-02-14 NOTE — Patient Outreach (Signed)
[facility]) Care Management  02/14/2016  [name] April 03, 1932 [id number]  REFERRAL DATE; 02/13/16 REFERRAL SOURCE: EMMI heart failure REFERRAL REASON:  EMMI heart failiure red alert: Day #15 call:  Weighed themselves today: "NO"  Second telephone outreach to patient regarding EMMI heart failure red alert.  Contact answering phone identified herself as patients daughter and states patient was not at home. HIPAA compliant message left with call back phone number.   PLAN; RNCM will attempt 3rd telephone outreach to patient within  1 week.  Quinn Plowman RN,BSN,CCM [facility] Telephonic  [phone]

## 2016-02-16 ENCOUNTER — Ambulatory Visit: Payer: Medicare Other

## 2016-02-16 ENCOUNTER — Encounter: Payer: Self-pay | Admitting: Vascular Surgery

## 2016-02-16 ENCOUNTER — Ambulatory Visit: Payer: Self-pay

## 2016-02-17 ENCOUNTER — Telehealth: Payer: Self-pay | Admitting: *Deleted

## 2016-02-17 ENCOUNTER — Encounter: Payer: Medicare Other | Admitting: Vascular Surgery

## 2016-02-17 NOTE — Telephone Encounter (Signed)
Patient cancelled his appt today with Dr. Donzetta Matters due to the snow. He then called our office back requesting pain medications. I tried x 2 to call him to discuss but there was no answer so I left a message.

## 2016-02-20 DIAGNOSIS — J449 Chronic obstructive pulmonary disease, unspecified: Secondary | ICD-10-CM | POA: Diagnosis not present

## 2016-02-22 ENCOUNTER — Other Ambulatory Visit: Payer: Self-pay

## 2016-02-22 NOTE — Patient Outreach (Signed)
[place] [facility]) Care Management  02/22/2016  [name] April 24, 1932 [id number]  REFERRAL DATE; 02/13/16 REFERRAL SOURCE: EMMI heart failure REFERRAL REASON: EMMI heart failiure red alert: Day #15 call: Weighed themselves today: "NO"  Third telephone outreach to patient regarding EMMI heart failure alert.  Contact answering phone identified herself as patients daughter. States patient is not at home.  HIPAA compliant message left with call back phone number.  PLAN: RNCM will send patient outreach letter to attempt contact.   Quinn Plowman RN,BSN,CCM [facility] Telephonic  [phone]

## 2016-02-27 ENCOUNTER — Ambulatory Visit (INDEPENDENT_AMBULATORY_CARE_PROVIDER_SITE_OTHER): Payer: Medicare Other | Admitting: *Deleted

## 2016-02-27 DIAGNOSIS — I482 Chronic atrial fibrillation: Secondary | ICD-10-CM

## 2016-02-27 DIAGNOSIS — I4821 Permanent atrial fibrillation: Secondary | ICD-10-CM

## 2016-02-27 NOTE — Progress Notes (Signed)
Remote pacemaker transmission.   

## 2016-02-28 LAB — CUP PACEART REMOTE DEVICE CHECK
Battery Remaining Longevity: 70 mo
Battery Remaining Percentage: 95.5 %
Battery Voltage: 2.98 V
Date Time Interrogation Session: 20171218091038
Implantable Lead Implant Date: 20050706
Implantable Lead Location: 753858
Implantable Lead Location: 753860
Implantable Pulse Generator Implant Date: 20150923
Lead Channel Pacing Threshold Amplitude: 2.375 V
Lead Channel Pacing Threshold Pulse Width: 0.8 ms
Lead Channel Sensing Intrinsic Amplitude: 12 mV
Lead Channel Setting Pacing Amplitude: 2.5 V
Lead Channel Setting Pacing Pulse Width: 0.5 ms
Lead Channel Setting Sensing Sensitivity: 2 mV
MDC IDC LEAD IMPLANT DT: 20080423
MDC IDC MSMT LEADCHNL LV IMPEDANCE VALUE: 590 Ohm
MDC IDC MSMT LEADCHNL RV IMPEDANCE VALUE: 480 Ohm
MDC IDC MSMT LEADCHNL RV PACING THRESHOLD AMPLITUDE: 0.5 V
MDC IDC MSMT LEADCHNL RV PACING THRESHOLD PULSEWIDTH: 0.5 ms
MDC IDC SET LEADCHNL LV PACING AMPLITUDE: 3.375
MDC IDC SET LEADCHNL LV PACING PULSEWIDTH: 0.8 ms
Pulse Gen Model: 3222
Pulse Gen Serial Number: 7634311

## 2016-02-29 ENCOUNTER — Encounter: Payer: Self-pay | Admitting: Cardiology

## 2016-03-01 ENCOUNTER — Encounter: Payer: Self-pay | Admitting: Vascular Surgery

## 2016-03-01 DIAGNOSIS — E119 Type 2 diabetes mellitus without complications: Secondary | ICD-10-CM | POA: Diagnosis not present

## 2016-03-01 DIAGNOSIS — I10 Essential (primary) hypertension: Secondary | ICD-10-CM | POA: Diagnosis not present

## 2016-03-01 DIAGNOSIS — I482 Chronic atrial fibrillation: Secondary | ICD-10-CM | POA: Diagnosis not present

## 2016-03-08 DIAGNOSIS — D649 Anemia, unspecified: Secondary | ICD-10-CM | POA: Diagnosis not present

## 2016-03-08 DIAGNOSIS — I10 Essential (primary) hypertension: Secondary | ICD-10-CM | POA: Diagnosis not present

## 2016-03-11 ENCOUNTER — Emergency Department (HOSPITAL_COMMUNITY)
Admission: EM | Admit: 2016-03-11 | Discharge: 2016-03-12 | Disposition: A | Discharge status: Home / Self Care | Payer: Medicare Other | Attending: Emergency Medicine | Admitting: Emergency Medicine

## 2016-03-11 ENCOUNTER — Encounter (HOSPITAL_COMMUNITY): Payer: Self-pay | Admitting: Emergency Medicine

## 2016-03-11 ENCOUNTER — Emergency Department (HOSPITAL_COMMUNITY): Payer: Medicare Other

## 2016-03-11 DIAGNOSIS — R079 Chest pain, unspecified: Secondary | ICD-10-CM | POA: Diagnosis not present

## 2016-03-11 DIAGNOSIS — J189 Pneumonia, unspecified organism: Secondary | ICD-10-CM

## 2016-03-11 DIAGNOSIS — I2510 Atherosclerotic heart disease of native coronary artery without angina pectoris: Secondary | ICD-10-CM | POA: Diagnosis not present

## 2016-03-11 DIAGNOSIS — Z950 Presence of cardiac pacemaker: Secondary | ICD-10-CM | POA: Diagnosis not present

## 2016-03-11 DIAGNOSIS — I5022 Chronic systolic (congestive) heart failure: Secondary | ICD-10-CM | POA: Insufficient documentation

## 2016-03-11 DIAGNOSIS — Z87891 Personal history of nicotine dependence: Secondary | ICD-10-CM | POA: Insufficient documentation

## 2016-03-11 DIAGNOSIS — E1122 Type 2 diabetes mellitus with diabetic chronic kidney disease: Secondary | ICD-10-CM | POA: Diagnosis not present

## 2016-03-11 DIAGNOSIS — Z955 Presence of coronary angioplasty implant and graft: Secondary | ICD-10-CM | POA: Diagnosis not present

## 2016-03-11 DIAGNOSIS — R0602 Shortness of breath: Secondary | ICD-10-CM | POA: Diagnosis present

## 2016-03-11 DIAGNOSIS — I130 Hypertensive heart and chronic kidney disease with heart failure and stage 1 through stage 4 chronic kidney disease, or unspecified chronic kidney disease: Secondary | ICD-10-CM | POA: Diagnosis not present

## 2016-03-11 DIAGNOSIS — Z79899 Other long term (current) drug therapy: Secondary | ICD-10-CM | POA: Diagnosis not present

## 2016-03-11 DIAGNOSIS — N183 Chronic kidney disease, stage 3 (moderate): Secondary | ICD-10-CM | POA: Diagnosis not present

## 2016-03-11 DIAGNOSIS — J449 Chronic obstructive pulmonary disease, unspecified: Secondary | ICD-10-CM | POA: Diagnosis not present

## 2016-03-11 DIAGNOSIS — Z8673 Personal history of transient ischemic attack (TIA), and cerebral infarction without residual deficits: Secondary | ICD-10-CM | POA: Diagnosis not present

## 2016-03-11 DIAGNOSIS — Z951 Presence of aortocoronary bypass graft: Secondary | ICD-10-CM | POA: Diagnosis not present

## 2016-03-11 DIAGNOSIS — Z8546 Personal history of malignant neoplasm of prostate: Secondary | ICD-10-CM | POA: Diagnosis not present

## 2016-03-11 DIAGNOSIS — E119 Type 2 diabetes mellitus without complications: Secondary | ICD-10-CM | POA: Diagnosis not present

## 2016-03-11 LAB — BASIC METABOLIC PANEL
Anion gap: 11 (ref 5–15)
BUN: 17 mg/dL (ref 6–20)
CALCIUM: 9 mg/dL (ref 8.9–10.3)
CO2: 25 mmol/L (ref 22–32)
CREATININE: 1.33 mg/dL — AB (ref 0.61–1.24)
Chloride: 98 mmol/L — ABNORMAL LOW (ref 101–111)
GFR calc Af Amer: 56 mL/min — ABNORMAL LOW (ref >60)
GFR calc non Af Amer: 48 mL/min — ABNORMAL LOW (ref >60)
GLUCOSE: 102 mg/dL — AB (ref 65–99)
Potassium: 5.1 mmol/L (ref 3.5–5.1)
Sodium: 134 mmol/L — ABNORMAL LOW (ref 135–145)

## 2016-03-11 LAB — CBC
HCT: 27 % — ABNORMAL LOW (ref 39.0–52.0)
HEMOGLOBIN: 8.8 g/dL — AB (ref 13.0–17.0)
MCH: 32.1 pg (ref 26.0–34.0)
MCHC: 32.6 g/dL (ref 30.0–36.0)
MCV: 98.5 fL (ref 78.0–100.0)
Platelets: 139 10*3/uL — ABNORMAL LOW (ref 150–400)
RBC: 2.74 MIL/uL — ABNORMAL LOW (ref 4.22–5.81)
RDW: 18.1 % — ABNORMAL HIGH (ref 11.5–15.5)
WBC: 3.7 10*3/uL — ABNORMAL LOW (ref 4.0–10.5)

## 2016-03-11 LAB — I-STAT TROPONIN, ED: TROPONIN I, POC: 0.02 ng/mL (ref 0.00–0.08)

## 2016-03-11 MED ORDER — ALBUTEROL SULFATE (2.5 MG/3ML) 0.083% IN NEBU
5.0000 mg | INHALATION_SOLUTION | Freq: Once | RESPIRATORY_TRACT | Status: AC
  Administered 2016-03-11: 5 mg via RESPIRATORY_TRACT
  Filled 2016-03-11: qty 6

## 2016-03-11 MED ORDER — AZITHROMYCIN 250 MG PO TABS
500.0000 mg | ORAL_TABLET | Freq: Once | ORAL | Status: AC
  Administered 2016-03-11: 500 mg via ORAL
  Filled 2016-03-11: qty 2

## 2016-03-11 MED ORDER — BENZONATATE 100 MG PO CAPS
200.0000 mg | ORAL_CAPSULE | Freq: Once | ORAL | Status: AC
  Administered 2016-03-11: 200 mg via ORAL
  Filled 2016-03-11: qty 2

## 2016-03-11 NOTE — ED Provider Notes (Signed)
[facility] DEPT Provider Note    By signing my name below, I, Bea Graff, attest that this documentation has been prepared under the direction and in the presence of Everlene Balls, MD. Electronically Signed: Bea Graff, ED Scribe. 03/12/16. 12:28 AM.    History   Chief Complaint Chief Complaint  Patient presents with  . Chest Pain  . Shortness of Breath   The history is provided by the patient and medical records. No language interpreter was used.    HPI Comments:  [name] is a 82 y.o. male with PMHx of COPD who presents to the Emergency Department complaining of a productive cough of white, thick phlegm that began two days ago. He reports associated SOB. He states he has been using his albuterol and taking a cough medication that his PCP prescribed but is unsure the name.  He denies modifying factors. He denies fever, chills, nausea, vomiting. Pt states he is on home O2 at 2 L/min.   Past Medical History:  Diagnosis Date  . Arthritis   . Black lung disease ([place])   . CAD (coronary artery disease)    a. 07/2002 CABG x 3: LIMA->LAD, VG->Diag, VG->OM;  b. 06/2006 Cath: LM 50-60ost/p, LAD patent mid stent, D1 sev dzs, D2 patent stent, LCX nl, OM2 sev sten prox, RCA large/nl, VG->Diag nl, VG->OM nl, LIMA->LAD atretic, EF 30%.  . Carotid artery occlusion   . Chronic atrial fibrillation ([place])   . Chronic respiratory failure ([place])   . Chronic systolic CHF (congestive heart failure) ([place])    a. 12/2012 Echo: EF 20-25%, mid-dist antsept AK, mod dil LA.  [name] COPD (chronic obstructive pulmonary disease) ([place])    a. On home O2.  [name] CVA (cerebral vascular accident) ([place])   . Diabetes mellitus   . GERD (gastroesophageal reflux disease)   . Gout   . Hypercholesterolemia   . Hypertension   . Ischemic cardiomyopathy    a. 06/2006  BIV-ICD -> gen change 2015 to [place].  . Lung cancer ([place])   . Nephrolithiasis 09/2000  . On home oxygen therapy    "2.5L; 24/7"  (01/16/2016)  . Prostate cancer ([place]) 11/01/10   gleason 7, 8, 9, gold seeds 02/08/11  . Symptomatic bradycardia     Patient Active Problem List   Diagnosis Date Noted  . Arterial thrombosis ([place]) 01/20/2016  . DM2 (diabetes mellitus, type 2) ([place]) 01/20/2016  . Critical lower limb ischemia 01/20/2016  . Ischemic leg 01/20/2016  . Chronic renal disease, stage III 01/19/2016  . Chronic anticoagulation-Xarelto 01/19/2016  . Cardiomyopathy, ischemic 01/19/2016  . Acute on chronic systolic CHF (congestive heart failure) ([place]) 01/18/2016  . Anemia 01/16/2016  . Black lung disease ([place]) 10/27/2015  . Pain in the chest   . Hx of CABG x 3 2004 after multiple PCI's in'90s   . Chronic respiratory failure 08/08/2013  . CAP (community acquired pneumonia) 08/07/2013  . Pneumonia 08/07/2013  . Occlusion and stenosis of carotid artery without mention of cerebral infarction 05/26/2013  . Syncope 02/15/2013  . Weakness generalized 05/14/2011  . On home oxygen therapy   . Prostate cancer ([place])   . Pacemaker   . Hematochezia 02/19/2011  . Melena 02/19/2011  . Prostate cancer, recur risk not determined whether low, med or high ([place])   . Systolic CHF, chronic ([place]) 02/16/2011  . SBO (small bowel obstruction) 02/15/2011  . ARF (acute renal failure) ([place]) 02/15/2011  . UTI (lower urinary tract infection) 02/15/2011  . Syncope and  collapse 02/10/2011  . S/P St Jude BiV ICD upgrade 2015 05/24/2009  . HEART BLOCK 06/01/2008  . Atrial fibrillation ([place]) 06/01/2008    Past Surgical History:  Procedure Laterality Date  . BI-VENTRICULAR PACEMAKER INSERTION (CRT-P)  12-02-13   downgrade of previously implanted CRTD to STJ CRTP  . BIV PACEMAKER GENERATOR CHANGE OUT N/A 12/02/2013   Procedure: BIV PACEMAKER GENERATOR CHANGE OUT;  Surgeon: Evans Lance, MD;  Location[facility] C[facility] LAB;  Service: Cardiovascular;  Laterality: N/A;  . COLONOSCOPY    . CORONARY ANGIOPLASTY WITH STENT PLACEMENT  07/1997;  08/1997;03/1998  . CORONARY ARTERY BYPASS GRAFT  07/2002   CABG X3  . ESOPHAGOGASTRODUODENOSCOPY  02/19/2011   Procedure: ESOPHAGOGASTRODUODENOSCOPY (EGD);  Surgeon: Jeryl Columbia, MD;  Location[facility] ENDOSCOPY;  Service: Endoscopy;  Laterality: N/A;  . FEMORAL-POPLITEAL BYPASS GRAFT Right 01/20/2016   Procedure: THROMBECTOMY OF POPLITEAL ARTERY AND ANTERIOR TIBIAL ARTERY RIGHT LEG; INTRAOPERATIVE ARTERIOGRAM;  Surgeon: Waynetta Sandy, MD;  Location: [facility];  Service: Vascular;  Laterality: Right;  . INCISION AND DRAINAGE OF WOUND  08/2002   right thigh; S/P EVH  . INSERT / REPLACE / [place]; 1992; 01/2000;  . INSERT / REPLACE / REMOVE PACEMAKER  09/2003; 06/2006   w/AICD  . INSERT / REPLACE / REMOVE PACEMAKER  12/2004   pacmaker explant  . SHOULDER ARTHROSCOPY W/ ROTATOR CUFF REPAIR  05/2008   left  . upper endoscpopy         Home Medications    Prior to Admission medications   Medication Sig Start Date End Date Taking? Authorizing Provider  acetaminophen (TYLENOL) 500 MG tablet Take 500 mg by mouth every 6 (six) hours as needed (pain).    Historical Provider, MD  albuterol (PROVENTIL HFA;VENTOLIN HFA) 108 (90 Base) MCG/ACT inhaler Inhale 2 puffs into the lungs every 4 (four) hours as needed for wheezing or shortness of breath. 06/10/15   Shawn C Joy, PA-C  albuterol (PROVENTIL) (2.5 MG/3ML) 0.083% nebulizer solution Take 2.5 mg by nebulization every 6 (six) hours as needed for wheezing or shortness of breath.    Historical Provider, MD  allopurinol (ZYLOPRIM) 300 MG tablet Take 300 mg by mouth daily.      Historical Provider, MD  apixaban (ELIQUIS) 5 MG TABS tablet Take 2 tablets (10 mg total) by mouth 2 (two) times daily. For 3 more days. On Monday 01/30/16 take 5 mg twice daily 01/26/16   Caren Griffins, MD  azithromycin (ZITHROMAX) 250 MG tablet Take 1 tablet (250 mg total) by mouth daily. 03/12/16   Everlene Balls, MD  benzonatate (TESSALON) 100 MG capsule Take 1  capsule (100 mg total) by mouth every 8 (eight) hours. 06/10/15   Shawn C Joy, PA-C  CALCIUM-VITAMIN D PO Take 1 tablet by mouth 2 (two) times daily.     Historical Provider, MD  DIGITEK 125 MCG tablet Take 1 tablet by mouth daily. 01/11/16   Historical Provider, MD  ferrous sulfate 325 (65 FE) MG tablet Take 1 tablet (325 mg total) by mouth 2 (two) times daily with a meal. 01/26/16   [place] Karlyne Greenspan, MD  furosemide (LASIX) 20 MG tablet Take 20 mg by mouth daily.    Historical Provider, MD  guaifenesin (ROBITUSSIN) 100 MG/5ML syrup Take 200 mg by mouth 3 (three) times daily as needed for cough.    Historical Provider, MD  hydrocortisone cream 1 % Apply to affected area 2 times daily Patient taking differently: Apply 1 application  topically 2 (two) times daily as needed for itching. Apply to affected area 2 times daily 10/05/14   Fransico Meadow, PA-C  isosorbide mononitrate (IMDUR) 30 MG 24 hr tablet Take 30 mg by mouth daily. 01/11/16   Historical Provider, MD  metoprolol succinate (TOPROL-XL) 25 MG 24 hr tablet Take 25 mg by mouth daily. 07/27/13   Historical Provider, MD  nitroGLYCERIN (NITROSTAT) 0.4 MG SL tablet Place 0.4 mg under the tongue every 5 (five) minutes as needed for chest pain.    Historical Provider, MD  Omega-3 Fatty Acids (FISH OIL) 1000 MG CAPS Take 1,000 mg by mouth at bedtime.     Historical Provider, MD  omeprazole (PRILOSEC) 40 MG capsule Take 1 capsule (40 mg total) by mouth 2 (two) times daily. 01/20/16   Orson Eva, MD  oxyCODONE (OXY IR/ROXICODONE) 5 MG immediate release tablet Take 1 tablet (5 mg total) by mouth every 4 (four) hours as needed for moderate pain. 01/26/16   [place], MD  OXYGEN Inhale 2.5 L into the lungs continuous.    Historical Provider, MD  spironolactone (ALDACTONE) 25 MG tablet Take 0.5 tablets (12.5 mg total) by mouth daily. 01/21/16   Orson Eva, MD  vitamin B-12 (CYANOCOBALAMIN) 1000 MCG tablet Take 1,000 mcg by mouth 2 (two) times daily.     Historical Provider, MD    Family History Family History  Problem Relation Age of Onset  . Alzheimer's disease Father 48  . Cancer Father 60    metastatic prostate cancer  . Diabetes Sister 67  . Diabetes Brother   . Diabetes Brother   . Diabetes Brother   . Hypotension Neg Hx   . Malignant hyperthermia Neg Hx   . Pseudochol deficiency Neg Hx     Social History Social History  Substance Use Topics  . Smoking status: Former Smoker    Types: Cigarettes    Quit date: 03/12/1949  . Smokeless tobacco: Current User    Types: Chew  . Alcohol use No     Comment: drank until per pt 03/19/11, h/o heavy use     Allergies   Aspirin and Penicillins   Review of Systems Review of Systems A complete 10 system review of systems was obtained and all systems are negative except as noted in the HPI and PMH.    Physical Exam Updated Vital Signs BP 114/60 (BP Location: Left Arm)   Pulse 63   Temp 97.5 F (36.4 C) (Oral)   Resp 20   SpO2 100%   Physical Exam  Constitutional: He is oriented to person, place, and time. Vital signs are normal. He appears well-developed and well-nourished.  Non-toxic appearance. He does not appear ill. No distress.  HENT:  Head: Normocephalic and atraumatic.  Nose: Nose normal.  Mouth/Throat: Oropharynx is clear and moist. No oropharyngeal exudate.  Eyes: Conjunctivae and EOM are normal. Pupils are equal, round, and reactive to light. No scleral icterus.  Neck: Normal range of motion. Neck supple. No tracheal deviation, no edema, no erythema and normal range of motion present. No thyroid mass and no thyromegaly present.  Cardiovascular: Normal rate, regular rhythm, S1 normal, S2 normal, normal heart sounds, intact distal pulses and normal pulses.  Exam reveals no gallop and no friction rub.   No murmur heard. Pulmonary/Chest: Effort normal. No respiratory distress. He has no wheezes. He has rhonchi. He has no rales.  Rhonchi of right lung. Left clear.  Nasal canula in place.  Abdominal: Soft. Normal appearance  and bowel sounds are normal. He exhibits no distension, no ascites and no mass. There is no hepatosplenomegaly. There is no tenderness. There is no rebound, no guarding and no CVA tenderness.  Musculoskeletal: Normal range of motion. He exhibits no edema or tenderness.  Lymphadenopathy:    He has no cervical adenopathy.  Neurological: He is alert and oriented to person, place, and time. He has normal strength. No cranial nerve deficit or sensory deficit.  Skin: Skin is warm, dry and intact. No petechiae and no rash noted. He is not diaphoretic. No erythema. No pallor.  Nursing note and vitals reviewed.    ED Treatments / Results  DIAGNOSTIC STUDIES: Oxygen Saturation is 100% on 2 L/min , normal by my interpretation.   COORDINATION OF CARE: 11:25 PM- Will review CXR. Pt verbalizes understanding and agrees to plan.  Medications  benzonatate (TESSALON) capsule 200 mg (200 mg Oral Given 03/11/16 2346)  albuterol (PROVENTIL) (2.5 MG/3ML) 0.083% nebulizer solution 5 mg (5 mg Nebulization Given 03/11/16 2346)  azithromycin (ZITHROMAX) tablet 500 mg (500 mg Oral Given 03/11/16 2346)    Labs (all labs ordered are listed, but only abnormal results are displayed) Labs Reviewed  BASIC METABOLIC PANEL - Abnormal; Notable for the following:       Result Value   Sodium 134 (*)    Chloride 98 (*)    Glucose, Bld 102 (*)    Creatinine, Ser 1.33 (*)    GFR calc non Af Amer 48 (*)    GFR calc Af Amer 56 (*)    All other components within normal limits  CBC - Abnormal; Notable for the following:    WBC 3.7 (*)    RBC 2.74 (*)    Hemoglobin 8.8 (*)    HCT 27.0 (*)    RDW 18.1 (*)    Platelets 139 (*)    All other components within normal limits  I-STAT TROPOININ, ED    EKG  EKG Interpretation  Date/Time:  Sunday March 11 2016 22:41:45 EST Ventricular Rate:  60 PR Interval:    QRS Duration: 164 QT Interval:  500 QTC  Calculation: 500 R Axis:   147 Text Interpretation:  Ventricular-paced rhythm no scarbossa Abnormal ECG Confirmed by Glynn Octave [phone]) on 03/11/2016 11:32:27 PM       Radiology Dg Chest 2 View  Result Date: 03/11/2016 CLINICAL DATA:  Chest pain EXAM: CHEST  2 VIEW COMPARISON:  01/16/2016 FINDINGS: LEFT-sided pacemaker with 2 continuous leads overlies stable enlarged cardiac silhouette. There is small LEFT effusion. Lower lobe chronic interstitial pattern with mild interstitial edema. No focal consolidation. No pneumothorax. IMPRESSION: 1. No significant change. 2. Cardiomegaly small LEFT effusion. 3. Chronic interstitial findings in lower lobes with mild interstitial edema. Electronically Signed   By: Suzy Bouchard M.D.   On: 03/11/2016 23:24    Procedures Procedures (including critical care time)  Medications Ordered in ED Medications  benzonatate (TESSALON) capsule 200 mg (200 mg Oral Given 03/11/16 2346)  albuterol (PROVENTIL) (2.5 MG/3ML) 0.083% nebulizer solution 5 mg (5 mg Nebulization Given 03/11/16 2346)  azithromycin (ZITHROMAX) tablet 500 mg (500 mg Oral Given 03/11/16 2346)     Initial Impression / Assessment and Plan / ED Course  I have reviewed the triage vital signs and the nursing notes.  Pertinent labs & imaging results that were available during my care of the patient were reviewed by me and considered in my medical decision making (see chart for details).  Clinical Course  Patient presents to the ED for cough and CP.  Cough is productive and he has isolated R sided lung findings.  Likely pneumonia although cxr is negative.  Will treat given age and history.  He was given azithromycin, tessalon, and albuterol for cough and pneumonia.  Will DC with 4 more days of abx.  PCP fu advised within 3 days.  He appears well and in NAD.  VS remain within his normal limits and he is safe for DC.    I personally performed the services described in this  documentation, which was scribed in my presence. The recorded information has been reviewed and is accurate.     Final Clinical Impressions(s) / ED Diagnoses   Final diagnoses:  Community acquired pneumonia of right lung, unspecified part of lung    New Prescriptions Discharge Medication List as of 03/12/2016 12:31 AM    START taking these medications   Details  azithromycin (ZITHROMAX) 250 MG tablet Take 1 tablet (250 mg total) by mouth daily., Starting Mon 03/12/2016, Print         Everlene Balls, MD 03/12/16 [phone]

## 2016-03-11 NOTE — ED Triage Notes (Signed)
Pt reports chest pain and shortness of breath since yesterday, states breathing tx at home not effective. HX COPD, oxygen dependent.

## 2016-03-11 NOTE — ED Notes (Signed)
Patient transported to X-ray 

## 2016-03-12 MED ORDER — AZITHROMYCIN 250 MG PO TABS: 250.0000 mg | ORAL_TABLET | Freq: Every day | ORAL | 0 refills | Status: DC

## 2016-03-14 ENCOUNTER — Ambulatory Visit (INDEPENDENT_AMBULATORY_CARE_PROVIDER_SITE_OTHER): Payer: Self-pay | Admitting: Vascular Surgery

## 2016-03-14 VITALS — BP 137/82 | HR 60 | Temp 96.8°F | Resp 16 | Ht 66.0 in | Wt 129.0 lb

## 2016-03-14 DIAGNOSIS — Z48812 Encounter for surgical aftercare following surgery on the circulatory system: Secondary | ICD-10-CM

## 2016-03-14 NOTE — Progress Notes (Signed)
Patient name: [name] MRN: [id number] DOB: [birth year] Sex: male  REASON FOR VISIT: post-op  HPI: [name] is a 83 y.o. male who presents for follow-up s/p right lower extremity thromboembolectomy on 01/20/2016 by Dr. Donzetta Matters. The patient presented to the [facility] emergency department with an acutely ischemic right lower extremity after he was taken off of his Xarelto during recent hospitalization. He was on Xarelto for atrial fibrillation, but the patient had anemia during his admission. His Xarelto was on hold pending GI workup.   The patient is now on Eliquis. He denies any issues with his right lower extremity.   Current Outpatient Prescriptions  Medication Sig Dispense Refill  . acetaminophen (TYLENOL) 500 MG tablet Take 500 mg by mouth every 6 (six) hours as needed (pain).    [name] albuterol (PROVENTIL HFA;VENTOLIN HFA) 108 (90 Base) MCG/ACT inhaler Inhale 2 puffs into the lungs every 4 (four) hours as needed for wheezing or shortness of breath. 1 Inhaler 2  . albuterol (PROVENTIL) (2.5 MG/3ML) 0.083% nebulizer solution Take 2.5 mg by nebulization every 6 (six) hours as needed for wheezing or shortness of breath.    . allopurinol (ZYLOPRIM) 300 MG tablet Take 300 mg by mouth daily.      [name] apixaban (ELIQUIS) 5 MG TABS tablet Take 2 tablets (10 mg total) by mouth 2 (two) times daily. For 3 more days. On Monday 01/30/16 take 5 mg twice daily 36 tablet 0  . azithromycin (ZITHROMAX) 250 MG tablet Take 1 tablet (250 mg total) by mouth daily. 4 tablet 0  . benzonatate (TESSALON) 100 MG capsule Take 1 capsule (100 mg total) by mouth every 8 (eight) hours. 21 capsule 0  . CALCIUM-VITAMIN D PO Take 1 tablet by mouth 2 (two) times daily.     . ferrous sulfate 325 (65 FE) MG tablet Take 1 tablet (325 mg total) by mouth 2 (two) times daily with a meal. 60 tablet 0  . furosemide (LASIX) 20 MG tablet Take 20 mg by mouth daily.    [name] guaifenesin (ROBITUSSIN) 100 MG/5ML syrup Take 200 mg by mouth 3  (three) times daily as needed for cough.    . hydrocortisone cream 1 % Apply to affected area 2 times daily (Patient taking differently: Apply 1 application topically 2 (two) times daily as needed for itching. Apply to affected area 2 times daily) 15 g 0  . isosorbide mononitrate (IMDUR) 30 MG 24 hr tablet Take 30 mg by mouth daily.  1  . metoprolol succinate (TOPROL-XL) 25 MG 24 hr tablet Take 25 mg by mouth daily.    . nitroGLYCERIN (NITROSTAT) 0.4 MG SL tablet Place 0.4 mg under the tongue every 5 (five) minutes as needed for chest pain.    . Omega-3 Fatty Acids (FISH OIL) 1000 MG CAPS Take 1,000 mg by mouth at bedtime.     [name] omeprazole (PRILOSEC) 40 MG capsule Take 1 capsule (40 mg total) by mouth 2 (two) times daily. 60 capsule 1  . oxyCODONE (OXY IR/ROXICODONE) 5 MG immediate release tablet Take 1 tablet (5 mg total) by mouth every 4 (four) hours as needed for moderate pain. 15 tablet 0  . OXYGEN Inhale 2.5 L into the lungs continuous.    [name] spironolactone (ALDACTONE) 25 MG tablet Take 0.5 tablets (12.5 mg total) by mouth daily. 30 tablet 0  . vitamin B-12 (CYANOCOBALAMIN) 1000 MCG tablet Take 1,000 mcg by mouth 2 (two) times daily.     No current facility-administered  medications for this visit.     REVIEW OF SYSTEMS:  '[X]'$  denotes positive finding, '[ ]'$  denotes negative finding Cardiac  Comments:  Chest pain or chest pressure:    Shortness of breath upon exertion:    Short of breath when lying flat:    Irregular heart rhythm:    Constitutional    Fever or chills:      PHYSICAL EXAM: Vitals:   03/14/16 1332  BP: 137/82  Pulse: 60  Resp: 16  Temp: (!) 96.8 F (36 C)  TempSrc: Oral  SpO2: 97%  Weight: 129 lb (58.5 kg)  Height: '5\' 6"'$  (1.676 m)    GENERAL: The patient is a well-nourished male, in no acute distress. The vital signs are documented above. PULMONARY: Nonlabored respiratory effort. VASCULAR: Right below knee incision is well-healed. Right foot is well-perfused.  Nonpalpable pedal pulses but brisk DP, PT and peroneal Doppler signals were obtained.  MEDICAL ISSUES: Status post right lower extremity thromboembolectomy  The patient is doing well postop. His right lower extremity is back to baseline. He is now on Eliquis for atrial fibrillation. His embolic episode was due to his anticoagulation being held. Discussed that he is unlikely to experience another embolic episode if he remains on anticoagulation. He'll follow-up when necessary.  Virgina Jock, PA-C [facility] MD: Dr. Donzetta Matters

## 2016-03-20 ENCOUNTER — Other Ambulatory Visit: Payer: Self-pay

## 2016-03-20 NOTE — Patient Outreach (Signed)
[facility]) Care Management  03/20/2016  [name] Jun 23, 1932 [id number]  No response from patient after 3 telephone attempt and letter outreach.   PLAN; RNCM will refer patient to care management assistant to close due to being unable to establish contact. RNCM will notify patients primary MD of closure.   Quinn Plowman RN,BSN,CCM [facility] Telephonic  [phone]

## 2016-03-21 ENCOUNTER — Emergency Department (HOSPITAL_COMMUNITY)
Admission: EM | Admit: 2016-03-21 | Discharge: 2016-03-22 | Disposition: A | Discharge status: Home / Self Care | Payer: Medicare Other | Attending: Emergency Medicine | Admitting: Emergency Medicine

## 2016-03-21 ENCOUNTER — Encounter (HOSPITAL_COMMUNITY): Payer: Self-pay | Admitting: Emergency Medicine

## 2016-03-21 ENCOUNTER — Emergency Department (HOSPITAL_COMMUNITY): Payer: Medicare Other

## 2016-03-21 DIAGNOSIS — Z955 Presence of coronary angioplasty implant and graft: Secondary | ICD-10-CM | POA: Insufficient documentation

## 2016-03-21 DIAGNOSIS — Z8673 Personal history of transient ischemic attack (TIA), and cerebral infarction without residual deficits: Secondary | ICD-10-CM | POA: Insufficient documentation

## 2016-03-21 DIAGNOSIS — Z8546 Personal history of malignant neoplasm of prostate: Secondary | ICD-10-CM | POA: Diagnosis not present

## 2016-03-21 DIAGNOSIS — I5033 Acute on chronic diastolic (congestive) heart failure: Secondary | ICD-10-CM | POA: Diagnosis not present

## 2016-03-21 DIAGNOSIS — Z951 Presence of aortocoronary bypass graft: Secondary | ICD-10-CM | POA: Diagnosis not present

## 2016-03-21 DIAGNOSIS — Z85118 Personal history of other malignant neoplasm of bronchus and lung: Secondary | ICD-10-CM | POA: Insufficient documentation

## 2016-03-21 DIAGNOSIS — I110 Hypertensive heart disease with heart failure: Secondary | ICD-10-CM | POA: Insufficient documentation

## 2016-03-21 DIAGNOSIS — I2510 Atherosclerotic heart disease of native coronary artery without angina pectoris: Secondary | ICD-10-CM | POA: Diagnosis not present

## 2016-03-21 DIAGNOSIS — I509 Heart failure, unspecified: Secondary | ICD-10-CM | POA: Insufficient documentation

## 2016-03-21 DIAGNOSIS — Z87891 Personal history of nicotine dependence: Secondary | ICD-10-CM | POA: Insufficient documentation

## 2016-03-21 DIAGNOSIS — R0602 Shortness of breath: Secondary | ICD-10-CM | POA: Diagnosis not present

## 2016-03-21 DIAGNOSIS — J449 Chronic obstructive pulmonary disease, unspecified: Secondary | ICD-10-CM | POA: Diagnosis not present

## 2016-03-21 DIAGNOSIS — Z7901 Long term (current) use of anticoagulants: Secondary | ICD-10-CM | POA: Insufficient documentation

## 2016-03-21 DIAGNOSIS — Z950 Presence of cardiac pacemaker: Secondary | ICD-10-CM | POA: Diagnosis not present

## 2016-03-21 LAB — BASIC METABOLIC PANEL
ANION GAP: 15 (ref 5–15)
BUN: 29 mg/dL — ABNORMAL HIGH (ref 6–20)
CALCIUM: 9.3 mg/dL (ref 8.9–10.3)
CO2: 21 mmol/L — ABNORMAL LOW (ref 22–32)
Chloride: 103 mmol/L (ref 101–111)
Creatinine, Ser: 1.62 mg/dL — ABNORMAL HIGH (ref 0.61–1.24)
GFR, EST AFRICAN AMERICAN: 44 mL/min — AB (ref >60)
GFR, EST NON AFRICAN AMERICAN: 38 mL/min — AB (ref >60)
GLUCOSE: 114 mg/dL — AB (ref 65–99)
Potassium: 4.6 mmol/L (ref 3.5–5.1)
SODIUM: 139 mmol/L (ref 135–145)

## 2016-03-21 LAB — I-STAT TROPONIN, ED: TROPONIN I, POC: 0.03 ng/mL (ref 0.00–0.08)

## 2016-03-21 LAB — CBC
HCT: 28 % — ABNORMAL LOW (ref 39.0–52.0)
HEMOGLOBIN: 9 g/dL — AB (ref 13.0–17.0)
MCH: 32.3 pg (ref 26.0–34.0)
MCHC: 32.1 g/dL (ref 30.0–36.0)
MCV: 100.4 fL — ABNORMAL HIGH (ref 78.0–100.0)
Platelets: 149 10*3/uL — ABNORMAL LOW (ref 150–400)
RBC: 2.79 MIL/uL — AB (ref 4.22–5.81)
RDW: 18.1 % — ABNORMAL HIGH (ref 11.5–15.5)
WBC: 3.5 10*3/uL — ABNORMAL LOW (ref 4.0–10.5)

## 2016-03-21 MED ORDER — FUROSEMIDE 20 MG PO TABS
40.0000 mg | ORAL_TABLET | Freq: Once | ORAL | Status: AC
  Administered 2016-03-22: 40 mg via ORAL
  Filled 2016-03-21: qty 2

## 2016-03-21 NOTE — ED Triage Notes (Signed)
Pt states he has been feeling SOB for the past few days. Pt wears 2L [place] O2 at home. Pt also takes home breathing treatments with no relief. Pt states he has to sit up in bed to breathe.

## 2016-03-21 NOTE — ED Provider Notes (Signed)
[facility] DEPT Provider Note   CSN: [id number] Arrival date & time: 03/21/16  1749   By signing my name below, I, [name], attest that this documentation has been prepared under the direction and in the presence of [name] Schmidt, MD  Electronically Signed: Delton [name], ED Scribe. 03/21/16. 11:47 PM.   History   Chief Complaint Chief Complaint  Patient presents with  . Shortness of Breath   The history is provided by the patient. No language interpreter was used.   HPI Comments:  [name] is a 83 y.o. male, with a hx of CAD, CHF, HTN, A-Fib,  who presents to the Emergency Department complaining of persistent SOB x several days. Pt also reports a cough, lower chest pain and waxing/waning weight change. Pt denies fevers, vomiting and is unsure if he has new leg swelling. He has used his breathing treatments with no relief. Pt reports he wears 2.5 L of oxygen at home and is on 20 mg of lasix daily.   Past Medical History:  Diagnosis Date  . Arthritis   . Black lung disease ([name])   . CAD (coronary artery disease)    a. 07/2002 CABG x 3: LIMA->LAD, VG->Diag, VG->OM;  b. 06/2006 Cath: LM 50-60ost/p, LAD patent mid stent, D1 sev dzs, D2 patent stent, LCX nl, OM2 sev sten prox, RCA large/nl, VG->Diag nl, VG->OM nl, LIMA->LAD atretic, EF 30%.  . Carotid artery occlusion   . Chronic atrial fibrillation ([name])   . Chronic respiratory failure ([name])   . Chronic systolic CHF (congestive heart failure) ([name])    a. 12/2012 Echo: EF 20-25%, mid-dist antsept AK, mod dil LA.  [name] COPD (chronic obstructive pulmonary disease) ([place])    a. On home O2.  [name] CVA (cerebral vascular accident) ([name])   . Diabetes mellitus   . GERD (gastroesophageal reflux disease)   . Gout   . Hypercholesterolemia   . Hypertension   . Ischemic cardiomyopathy    a. 06/2006  BIV-ICD -> gen change 2015 to [place].  . Lung cancer ([place])   . Nephrolithiasis 09/2000  . On home oxygen therapy    "2.5L; 24/7"  (01/16/2016)  . Prostate cancer ([place]) 11/01/10   gleason 7, 8, 9, gold seeds 02/08/11  . Symptomatic bradycardia     Patient Active Problem List   Diagnosis Date Noted  . Arterial thrombosis ([name]) 01/20/2016  . DM2 (diabetes mellitus, type 2) ([name]) 01/20/2016  . Critical lower limb ischemia 01/20/2016  . Ischemic leg 01/20/2016  . Chronic renal disease, stage III 01/19/2016  . Chronic anticoagulation-Xarelto 01/19/2016  . Cardiomyopathy, ischemic 01/19/2016  . Acute on chronic systolic CHF (congestive heart failure) ([name]) 01/18/2016  . Anemia 01/16/2016  . Black lung disease ([name]) 10/27/2015  . Pain in the chest   . Hx of CABG x 3 2004 after multiple PCI's in'90s   . Chronic respiratory failure 08/08/2013  . CAP (community acquired pneumonia) 08/07/2013  . Pneumonia 08/07/2013  . Occlusion and stenosis of carotid artery without mention of cerebral infarction 05/26/2013  . Syncope 02/15/2013  . Weakness generalized 05/14/2011  . On home oxygen therapy   . Prostate cancer ([name])   . Pacemaker   . Hematochezia 02/19/2011  . Melena 02/19/2011  . Prostate cancer, recur risk not determined whether low, med or high ([name])   . Systolic CHF, chronic ([name]) 02/16/2011  . SBO (small bowel obstruction) 02/15/2011  . ARF (acute renal failure) ([name]) 02/15/2011  . UTI (lower urinary tract infection)  02/15/2011  . Syncope and collapse 02/10/2011  . S/P [employer] Jude BiV ICD upgrade 2015 05/24/2009  . HEART BLOCK 06/01/2008  . Atrial fibrillation ([name]) 06/01/2008    Past Surgical History:  Procedure Laterality Date  . BI-VENTRICULAR PACEMAKER INSERTION (CRT-P)  12-02-13   downgrade of previously implanted CRTD to STJ CRTP  . BIV PACEMAKER GENERATOR CHANGE OUT N/A 12/02/2013   Procedure: BIV PACEMAKER GENERATOR CHANGE OUT;  Surgeon: Evans Lance, MD;  Location: [facility] CATH LAB;  Service: Cardiovascular;  Laterality: N/A;  . COLONOSCOPY    . CORONARY ANGIOPLASTY WITH STENT PLACEMENT  07/1997;  08/1997;03/1998  . CORONARY ARTERY BYPASS GRAFT  07/2002   CABG X3  . ESOPHAGOGASTRODUODENOSCOPY  02/19/2011   Procedure: ESOPHAGOGASTRODUODENOSCOPY (EGD);  Surgeon: Jeryl Columbia, MD;  Location: [facility] ENDOSCOPY;  Service: Endoscopy;  Laterality: N/A;  . FEMORAL-POPLITEAL BYPASS GRAFT Right 01/20/2016   Procedure: THROMBECTOMY OF POPLITEAL ARTERY AND ANTERIOR TIBIAL ARTERY RIGHT LEG; INTRAOPERATIVE ARTERIOGRAM;  Surgeon: Waynetta Sandy, MD;  Location: [facility];  Service: Vascular;  Laterality: Right;  . INCISION AND DRAINAGE OF WOUND  08/2002   right thigh; S/P EVH  . INSERT / REPLACE / [place]; 1992; 01/2000;  . INSERT / REPLACE / REMOVE PACEMAKER  09/2003; 06/2006   w/AICD  . INSERT / REPLACE / REMOVE PACEMAKER  12/2004   pacmaker explant  . SHOULDER ARTHROSCOPY W/ ROTATOR CUFF REPAIR  05/2008   left  . upper endoscpopy         Home Medications    Prior to Admission medications   Medication Sig Start Date End Date Taking? Authorizing Provider  acetaminophen (TYLENOL) 500 MG tablet Take 500 mg by mouth every 6 (six) hours as needed (pain).    Historical Provider, MD  albuterol (PROVENTIL HFA;VENTOLIN HFA) 108 (90 Base) MCG/ACT inhaler Inhale 2 puffs into the lungs every 4 (four) hours as needed for wheezing or shortness of breath. 06/10/15   Shawn C Joy, PA-C  albuterol (PROVENTIL) (2.5 MG/3ML) 0.083% nebulizer solution Take 2.5 mg by nebulization every 6 (six) hours as needed for wheezing or shortness of breath.    Historical Provider, MD  allopurinol (ZYLOPRIM) 300 MG tablet Take 300 mg by mouth daily.      Historical Provider, MD  apixaban (ELIQUIS) 5 MG TABS tablet Take 2 tablets (10 mg total) by mouth 2 (two) times daily. For 3 more days. On Monday 01/30/16 take 5 mg twice daily 01/26/16   Caren Griffins, MD  azithromycin (ZITHROMAX) 250 MG tablet Take 1 tablet (250 mg total) by mouth daily. 03/12/16   Everlene Balls, MD  benzonatate (TESSALON) 100 MG capsule Take 1  capsule (100 mg total) by mouth every 8 (eight) hours. 06/10/15   Shawn C Joy, PA-C  CALCIUM-VITAMIN D PO Take 1 tablet by mouth 2 (two) times daily.     Historical Provider, MD  ferrous sulfate 325 (65 FE) MG tablet Take 1 tablet (325 mg total) by mouth 2 (two) times daily with a meal. 01/26/16   Costin Karlyne Greenspan, MD  furosemide (LASIX) 20 MG tablet Take 20 mg by mouth daily.    Historical Provider, MD  guaifenesin (ROBITUSSIN) 100 MG/5ML syrup Take 200 mg by mouth 3 (three) times daily as needed for cough.    Historical Provider, MD  hydrocortisone cream 1 % Apply to affected area 2 times daily Patient taking differently: Apply 1 application topically 2 (two) times daily as needed for itching. Apply to affected  area 2 times daily 10/05/14   Fransico Meadow, PA-C  isosorbide mononitrate (IMDUR) 30 MG 24 hr tablet Take 30 mg by mouth daily. 01/11/16   Historical Provider, MD  metoprolol succinate (TOPROL-XL) 25 MG 24 hr tablet Take 25 mg by mouth daily. 07/27/13   Historical Provider, MD  nitroGLYCERIN (NITROSTAT) 0.4 MG SL tablet Place 0.4 mg under the tongue every 5 (five) minutes as needed for chest pain.    Historical Provider, MD  Omega-3 Fatty Acids (FISH OIL) 1000 MG CAPS Take 1,000 mg by mouth at bedtime.     Historical Provider, MD  omeprazole (PRILOSEC) 40 MG capsule Take 1 capsule (40 mg total) by mouth 2 (two) times daily. 01/20/16   Orson Eva, MD  oxyCODONE (OXY IR/ROXICODONE) 5 MG immediate release tablet Take 1 tablet (5 mg total) by mouth every 4 (four) hours as needed for moderate pain. 01/26/16   [place]iston, MD  OXYGEN Inhale 2.5 L into the lungs continuous.    Historical Provider, MD  spironolactone (ALDACTONE) 25 MG tablet Take 0.5 tablets (12.5 mg total) by mouth daily. 01/21/16   Orson Eva, MD  vitamin B-12 (CYANOCOBALAMIN) 1000 MCG tablet Take 1,000 mcg by mouth 2 (two) times daily.    Historical Provider, MD    Family History Family History  Problem Relation Age of Onset    . Alzheimer's disease Father 22  . Cancer Father 106    metastatic prostate cancer  . Diabetes Sister 24  . Diabetes Brother   . Diabetes Brother   . Diabetes Brother   . Hypotension Neg Hx   . Malignant hyperthermia Neg Hx   . Pseudochol deficiency Neg Hx     Social History Social History  Substance Use Topics  . Smoking status: Former Smoker    Types: Cigarettes    Quit date: 03/12/1949  . Smokeless tobacco: Current User    Types: Chew  . Alcohol use No     Comment: drank until per pt 03/19/11, h/o heavy use     Allergies   Aspirin and Penicillins   Review of Systems Review of Systems 10 systems reviewed and all are negative for acute change except as noted in the HPI.  Physical Exam Updated Vital Signs BP 127/82   Pulse 66   Temp 97.3 F (36.3 C) (Axillary)   Resp 23   SpO2 100%   Physical Exam  Constitutional: He is oriented to person, place, and time. He appears well-developed and well-nourished.  HENT:  Head: Normocephalic and atraumatic.  Eyes: EOM are normal.  Neck: Normal range of motion.  Cardiovascular: Normal rate, regular rhythm, normal heart sounds and intact distal pulses.   Pulmonary/Chest: Effort normal. No respiratory distress. He has no wheezes. He has rales (mild).  Abdominal: Soft. He exhibits no distension. There is no tenderness.  Musculoskeletal: Normal range of motion.  Neurological: He is alert and oriented to person, place, and time.  Skin: Skin is warm and dry.  Psychiatric: He has a normal mood and affect. Judgment normal.  Nursing note and vitals reviewed.  ED Treatments / Results  DIAGNOSTIC STUDIES:  Oxygen Saturation is 100% on [place], normal by my interpretation.    COORDINATION OF CARE:  11:46 PM Discussed treatment plan with pt at bedside and pt agreed to plan.  Labs (all labs ordered are listed, but only abnormal results are displayed) Labs Reviewed  BASIC METABOLIC PANEL - Abnormal; Notable for the following:  Result Value   CO2 21 (*)    Glucose, Bld 114 (*)    BUN 29 (*)    Creatinine, Ser 1.62 (*)    GFR calc non Af Amer 38 (*)    GFR calc Af Amer 44 (*)    All other components within normal limits  CBC - Abnormal; Notable for the following:    WBC 3.5 (*)    RBC 2.79 (*)    Hemoglobin 9.0 (*)    HCT 28.0 (*)    MCV 100.4 (*)    RDW 18.1 (*)    Platelets 149 (*)    All other components within normal limits  BRAIN NATRIURETIC PEPTIDE - Abnormal; Notable for the following:    B Natriuretic Peptide 2,909.3 (*)    All other components within normal limits  I-STAT TROPOININ, ED  I-STAT TROPOININ, ED    EKG  EKG Interpretation  Date/Time:  Wednesday March 21 2016 17:59:21 EST Ventricular Rate:  62 PR Interval:    QRS Duration: 166 QT Interval:  508 QTC Calculation: 515 R Axis:   134 Text Interpretation:  Ventricular-paced rhythm Abnormal ECG No significant change was found Confirmed by Afshin Chrystal  MD, Lennette Bihari ([id number]) on 03/21/2016 11:44:21 PM       Radiology Dg Chest 2 View  Result Date: 03/21/2016 CLINICAL DATA:  Acute onset of shortness of breath. Initial encounter. EXAM: CHEST  2 VIEW COMPARISON:  Chest radiograph performed 03/11/2016 FINDINGS: The lungs are well-aerated. Small left pleural effusion is noted. Vascular congestion is seen. Increased interstitial markings likely reflect pulmonary edema, more prominent than on the prior study. No pneumothorax is seen. Mild chronic scarring is noted at the right midlung zone. The heart is borderline enlarged. The patient is status post median sternotomy, with evidence of prior CABG. A pacemaker/AICD is noted at the left chest wall, with leads ending at the right atrium, right ventricle and coronary sinus. A right-sided orphaned lead is also noted. No acute osseous abnormalities are seen. IMPRESSION: Small left pleural effusion. Vascular congestion and borderline cardiomegaly. Increased interstitial markings likely reflect pulmonary edema,  more prominent than on the prior study. Electronically Signed   By: Garald Balding M.D.   On: 03/21/2016 18:48    Procedures Procedures (including critical care time)  Medications Ordered in ED Medications  furosemide (LASIX) tablet 40 mg (40 mg Oral Given 03/22/16 0054)     Initial Impression / Assessment and Plan / ED Course  I have reviewed the triage vital signs and the nursing notes.  Pertinent labs & imaging results that were available during my care of the patient were reviewed by me and considered in my medical decision making (see chart for details).  Clinical Course     3:22 AM Patient feels better this time.  He was ambulated in the emergency department.  His oxygen saturation never fell below 90%.  He did not have increased work of breathing after ambulating.  I will double his dose of Lasix over the next 4 days.  He was given 40 mg of Lasix while in the emergency department and began his diuresis here.  I suspect this is mild CHF exacerbation.  He does have a productive cough and no clear infiltrate on chest x-ray.  I will prescribe him course of doxycycline.  Primary care and cardiology follow-up.  Patient understands return to the ER for new or worsening symptoms.  Final Clinical Impressions(s) / ED Diagnoses   Final diagnoses:  None  New Prescriptions New Prescriptions   DOXYCYCLINE (VIBRAMYCIN) 100 MG CAPSULE    Take 1 capsule (100 mg total) by mouth 2 (two) times daily.   I personally performed the services described in this documentation, which was scribed in my presence. The recorded information has been reviewed and is accurate.        [name] Schmidt, MD 03/22/16 [phone]

## 2016-03-22 DIAGNOSIS — I110 Hypertensive heart disease with heart failure: Secondary | ICD-10-CM | POA: Diagnosis not present

## 2016-03-22 DIAGNOSIS — J449 Chronic obstructive pulmonary disease, unspecified: Secondary | ICD-10-CM | POA: Diagnosis not present

## 2016-03-22 LAB — I-STAT TROPONIN, ED: TROPONIN I, POC: 0.03 ng/mL (ref 0.00–0.08)

## 2016-03-22 LAB — BRAIN NATRIURETIC PEPTIDE: B Natriuretic Peptide: 2909.3 pg/mL — ABNORMAL HIGH (ref 0.0–100.0)

## 2016-03-22 MED ORDER — DOXYCYCLINE HYCLATE 100 MG PO CAPS: 100.0000 mg | ORAL_CAPSULE | Freq: Two times a day (BID) | ORAL | 0 refills | Status: DC

## 2016-03-22 NOTE — ED Notes (Signed)
Pt ambulated in hall. Pulse 96.

## 2016-03-22 NOTE — Discharge Instructions (Signed)
Please double your home lasix dose for the next 4 days (take '40mg'$  Daily instead of '20mg'$  Daily)  Please call your primary care doctor and cardiologist for follow up  Return to the emergency department for new or worsening symptoms

## 2016-03-22 NOTE — ED Notes (Signed)
Ambulated with some ease as a standby assist. Momentary destat to 93%

## 2016-03-29 ENCOUNTER — Emergency Department (HOSPITAL_COMMUNITY): Payer: Medicare Other

## 2016-03-29 ENCOUNTER — Encounter (HOSPITAL_COMMUNITY): Payer: Self-pay | Admitting: Emergency Medicine

## 2016-03-29 ENCOUNTER — Emergency Department (HOSPITAL_COMMUNITY)
Admission: EM | Admit: 2016-03-29 | Discharge: 2016-03-29 | Disposition: A | Discharge status: Home / Self Care | Payer: Medicare Other | Attending: Emergency Medicine | Admitting: Emergency Medicine

## 2016-03-29 DIAGNOSIS — I509 Heart failure, unspecified: Secondary | ICD-10-CM | POA: Diagnosis not present

## 2016-03-29 DIAGNOSIS — J441 Chronic obstructive pulmonary disease with (acute) exacerbation: Secondary | ICD-10-CM | POA: Insufficient documentation

## 2016-03-29 DIAGNOSIS — Z950 Presence of cardiac pacemaker: Secondary | ICD-10-CM | POA: Insufficient documentation

## 2016-03-29 DIAGNOSIS — Z85118 Personal history of other malignant neoplasm of bronchus and lung: Secondary | ICD-10-CM | POA: Insufficient documentation

## 2016-03-29 DIAGNOSIS — E119 Type 2 diabetes mellitus without complications: Secondary | ICD-10-CM | POA: Insufficient documentation

## 2016-03-29 DIAGNOSIS — Z951 Presence of aortocoronary bypass graft: Secondary | ICD-10-CM | POA: Diagnosis not present

## 2016-03-29 DIAGNOSIS — Z8673 Personal history of transient ischemic attack (TIA), and cerebral infarction without residual deficits: Secondary | ICD-10-CM | POA: Insufficient documentation

## 2016-03-29 DIAGNOSIS — R0602 Shortness of breath: Secondary | ICD-10-CM | POA: Diagnosis not present

## 2016-03-29 DIAGNOSIS — Z8546 Personal history of malignant neoplasm of prostate: Secondary | ICD-10-CM | POA: Diagnosis not present

## 2016-03-29 DIAGNOSIS — I5023 Acute on chronic systolic (congestive) heart failure: Secondary | ICD-10-CM | POA: Insufficient documentation

## 2016-03-29 DIAGNOSIS — Z955 Presence of coronary angioplasty implant and graft: Secondary | ICD-10-CM | POA: Diagnosis not present

## 2016-03-29 DIAGNOSIS — N183 Chronic kidney disease, stage 3 (moderate): Secondary | ICD-10-CM | POA: Insufficient documentation

## 2016-03-29 DIAGNOSIS — I130 Hypertensive heart and chronic kidney disease with heart failure and stage 1 through stage 4 chronic kidney disease, or unspecified chronic kidney disease: Secondary | ICD-10-CM | POA: Diagnosis not present

## 2016-03-29 DIAGNOSIS — Z87891 Personal history of nicotine dependence: Secondary | ICD-10-CM | POA: Diagnosis not present

## 2016-03-29 DIAGNOSIS — R05 Cough: Secondary | ICD-10-CM | POA: Diagnosis not present

## 2016-03-29 DIAGNOSIS — Z79899 Other long term (current) drug therapy: Secondary | ICD-10-CM | POA: Insufficient documentation

## 2016-03-29 LAB — BASIC METABOLIC PANEL
Anion gap: 11 (ref 5–15)
BUN: 29 mg/dL — AB (ref 6–20)
CALCIUM: 9.1 mg/dL (ref 8.9–10.3)
CO2: 27 mmol/L (ref 22–32)
Chloride: 97 mmol/L — ABNORMAL LOW (ref 101–111)
Creatinine, Ser: 1.7 mg/dL — ABNORMAL HIGH (ref 0.61–1.24)
GFR calc Af Amer: 42 mL/min — ABNORMAL LOW (ref >60)
GFR, EST NON AFRICAN AMERICAN: 36 mL/min — AB (ref >60)
GLUCOSE: 117 mg/dL — AB (ref 65–99)
Potassium: 4.8 mmol/L (ref 3.5–5.1)
Sodium: 135 mmol/L (ref 135–145)

## 2016-03-29 LAB — CBC
HEMATOCRIT: 29.1 % — AB (ref 39.0–52.0)
HEMOGLOBIN: 9.1 g/dL — AB (ref 13.0–17.0)
MCH: 31.6 pg (ref 26.0–34.0)
MCHC: 31.3 g/dL (ref 30.0–36.0)
MCV: 101 fL — ABNORMAL HIGH (ref 78.0–100.0)
Platelets: 93 10*3/uL — ABNORMAL LOW (ref 150–400)
RBC: 2.88 MIL/uL — ABNORMAL LOW (ref 4.22–5.81)
RDW: 18.2 % — AB (ref 11.5–15.5)
WBC: 2.8 10*3/uL — ABNORMAL LOW (ref 4.0–10.5)

## 2016-03-29 LAB — I-STAT TROPONIN, ED
TROPONIN I, POC: 0.01 ng/mL (ref 0.00–0.08)
Troponin i, poc: 0.01 ng/mL (ref 0.00–0.08)

## 2016-03-29 LAB — BRAIN NATRIURETIC PEPTIDE: B Natriuretic Peptide: 2710.1 pg/mL — ABNORMAL HIGH (ref 0.0–100.0)

## 2016-03-29 MED ORDER — LEVOFLOXACIN 750 MG PO TABS
750.0000 mg | ORAL_TABLET | Freq: Once | ORAL | Status: DC
Start: 2016-03-29 — End: 2016-03-29

## 2016-03-29 MED ORDER — PREDNISONE 10 MG PO TABS: 40.0000 mg | ORAL_TABLET | Freq: Every day | ORAL | 0 refills | Status: DC

## 2016-03-29 MED ORDER — ALBUTEROL SULFATE (2.5 MG/3ML) 0.083% IN NEBU
2.5000 mg | INHALATION_SOLUTION | Freq: Once | RESPIRATORY_TRACT | Status: AC
  Administered 2016-03-29: 2.5 mg via RESPIRATORY_TRACT
  Filled 2016-03-29: qty 3

## 2016-03-29 MED ORDER — FUROSEMIDE 10 MG/ML IJ SOLN
20.0000 mg | Freq: Once | INTRAMUSCULAR | Status: AC
  Administered 2016-03-29: 20 mg via INTRAVENOUS
  Filled 2016-03-29: qty 2

## 2016-03-29 MED ORDER — PREDNISONE 20 MG PO TABS
60.0000 mg | ORAL_TABLET | Freq: Once | ORAL | Status: AC
  Administered 2016-03-29: 60 mg via ORAL
  Filled 2016-03-29: qty 3

## 2016-03-29 MED ORDER — IPRATROPIUM-ALBUTEROL 0.5-2.5 (3) MG/3ML IN SOLN
3.0000 mL | RESPIRATORY_TRACT | Status: AC
  Administered 2016-03-29 (×3): 3 mL via RESPIRATORY_TRACT
  Filled 2016-03-29: qty 3
  Filled 2016-03-29: qty 6

## 2016-03-29 MED ORDER — AZITHROMYCIN 250 MG PO TABS: 250.0000 mg | ORAL_TABLET | Freq: Every day | ORAL | 0 refills | Status: DC

## 2016-03-29 MED ORDER — AZITHROMYCIN 250 MG PO TABS
500.0000 mg | ORAL_TABLET | Freq: Once | ORAL | Status: AC
  Administered 2016-03-29: 500 mg via ORAL
  Filled 2016-03-29: qty 2

## 2016-03-29 MED ORDER — ALBUTEROL SULFATE HFA 108 (90 BASE) MCG/ACT IN AERS
2.0000 | INHALATION_SPRAY | Freq: Once | RESPIRATORY_TRACT | Status: AC
  Administered 2016-03-29: 2 via RESPIRATORY_TRACT
  Filled 2016-03-29: qty 6.7

## 2016-03-29 NOTE — Discharge Instructions (Signed)
Take '40mg'$  of your furosemide (lasix '20mg'$ , 2 tablets) daily for 4 days and follow up with your Doctor on Monday for recheck of laboratory work and further direction regarding your lasix doses.

## 2016-03-29 NOTE — ED Provider Notes (Signed)
[facility] DEPT Provider Note   CSN: [id number] Arrival date & time: 03/29/16  1334     History   Chief Complaint Chief Complaint  Patient presents with  . Shortness of Breath    HPI [name] is a 83 y.o. male.  HPI   2-3 days has felt like he can't hardly breath This morning woke up and felt dizzy headed and like couldn't breath Took a treatment but it "didn't do too much good" Has felt that way all morning, wheezing Felt this same way 1 month ago Feels like can't lay down because of shortness of breath Short of breath walking Coughing, coughing up white phlegm, been worse than usual over last few days, sob coughing too Sore in chest from coughing No fevers Has not checked weights today Has been taking lasix. Reports he was told to increase dose in ED last week and did for a few days then returned to normal dose of '20mg'$  daily.  Past Medical History:  Diagnosis Date  . Arthritis   . Black lung disease ([place])   . CAD (coronary artery disease)    a. 07/2002 CABG x 3: LIMA->LAD, VG->Diag, VG->OM;  b. 06/2006 Cath: LM 50-60ost/p, LAD patent mid stent, D1 sev dzs, D2 patent stent, LCX nl, OM2 sev sten prox, RCA large/nl, VG->Diag nl, VG->OM nl, LIMA->LAD atretic, EF 30%.  . Carotid artery occlusion   . Chronic atrial fibrillation ([place])   . Chronic respiratory failure ([place])   . Chronic systolic CHF (congestive heart failure) ([place])    a. 12/2012 Echo: EF 20-25%, mid-dist antsept AK, mod dil LA.  [name] COPD (chronic obstructive pulmonary disease) ([place])    a. On home O2.  [name] CVA (cerebral vascular accident) ([place])   . Diabetes mellitus   . GERD (gastroesophageal reflux disease)   . Gout   . Hypercholesterolemia   . Hypertension   . Ischemic cardiomyopathy    a. 06/2006  BIV-ICD -> gen change 2015 to [place].  . Lung cancer ([place])   . Nephrolithiasis 09/2000  . On home oxygen therapy    "2.5L; 24/7" (01/16/2016)  . Prostate cancer ([place]) 11/01/10   gleason 7, 8, 9,  gold seeds 02/08/11  . Symptomatic bradycardia     Patient Active Problem List   Diagnosis Date Noted  . Arterial thrombosis ([place]) 01/20/2016  . DM2 (diabetes mellitus, type 2) ([place]) 01/20/2016  . Critical lower limb ischemia 01/20/2016  . Ischemic leg 01/20/2016  . Chronic renal disease, stage III 01/19/2016  . Chronic anticoagulation-Xarelto 01/19/2016  . Cardiomyopathy, ischemic 01/19/2016  . Acute on chronic systolic CHF (congestive heart failure) ([place]) 01/18/2016  . Anemia 01/16/2016  . Black lung disease ([place]) 10/27/2015  . Pain in the chest   . Hx of CABG x 3 2004 after multiple PCI's in'90s   . Chronic respiratory failure 08/08/2013  . CAP (community acquired pneumonia) 08/07/2013  . Pneumonia 08/07/2013  . Occlusion and stenosis of carotid artery without mention of cerebral infarction 05/26/2013  . Syncope 02/15/2013  . Weakness generalized 05/14/2011  . On home oxygen therapy   . Prostate cancer ([place])   . Pacemaker   . Hematochezia 02/19/2011  . Melena 02/19/2011  . Prostate cancer, recur risk not determined whether low, med or high ([place])   . Systolic CHF, chronic ([place]) 02/16/2011  . SBO (small bowel obstruction) 02/15/2011  . ARF (acute renal failure) ([place]) 02/15/2011  . UTI (lower urinary tract infection) 02/15/2011  . Syncope and collapse  02/10/2011  . S/P [employer] Jude BiV ICD upgrade 2015 05/24/2009  . HEART BLOCK 06/01/2008  . Atrial fibrillation ([place]) 06/01/2008    Past Surgical History:  Procedure Laterality Date  . BI-VENTRICULAR PACEMAKER INSERTION (CRT-P)  12-02-13   downgrade of previously implanted CRTD to STJ CRTP  . BIV PACEMAKER GENERATOR CHANGE OUT N/A 12/02/2013   Procedure: BIV PACEMAKER GENERATOR CHANGE OUT;  Surgeon: Evans Lance, MD;  Location: [facility] CATH LAB;  Service: Cardiovascular;  Laterality: N/A;  . COLONOSCOPY    . CORONARY ANGIOPLASTY WITH STENT PLACEMENT  07/1997; 08/1997;03/1998  . CORONARY ARTERY BYPASS GRAFT  07/2002   CABG X3  .  ESOPHAGOGASTRODUODENOSCOPY  02/19/2011   Procedure: ESOPHAGOGASTRODUODENOSCOPY (EGD);  Surgeon: Jeryl Columbia, MD;  Location: [facility] ENDOSCOPY;  Service: Endoscopy;  Laterality: N/A;  . FEMORAL-POPLITEAL BYPASS GRAFT Right 01/20/2016   Procedure: THROMBECTOMY OF POPLITEAL ARTERY AND ANTERIOR TIBIAL ARTERY RIGHT LEG; INTRAOPERATIVE ARTERIOGRAM;  Surgeon: Waynetta Sandy, MD;  Location: [facility];  Service: Vascular;  Laterality: Right;  . INCISION AND DRAINAGE OF WOUND  08/2002   right thigh; S/P EVH  . INSERT / REPLACE / [place]; 1992; 01/2000;  . INSERT / REPLACE / REMOVE PACEMAKER  09/2003; 06/2006   w/AICD  . INSERT / REPLACE / REMOVE PACEMAKER  12/2004   pacmaker explant  . SHOULDER ARTHROSCOPY W/ ROTATOR CUFF REPAIR  05/2008   left  . upper endoscpopy         Home Medications    Prior to Admission medications   Medication Sig Start Date End Date Taking? Authorizing Provider  acetaminophen (TYLENOL) 500 MG tablet Take 500 mg by mouth every 6 (six) hours as needed (pain).    Historical Provider, MD  albuterol (PROVENTIL HFA;VENTOLIN HFA) 108 (90 Base) MCG/ACT inhaler Inhale 2 puffs into the lungs every 4 (four) hours as needed for wheezing or shortness of breath. 06/10/15   Shawn C Joy, PA-C  albuterol (PROVENTIL) (2.5 MG/3ML) 0.083% nebulizer solution Take 2.5 mg by nebulization every 6 (six) hours as needed for wheezing or shortness of breath.    Historical Provider, MD  allopurinol (ZYLOPRIM) 300 MG tablet Take 300 mg by mouth daily.      Historical Provider, MD  apixaban (ELIQUIS) 5 MG TABS tablet Take 2 tablets (10 mg total) by mouth 2 (two) times daily. For 3 more days. On Monday 01/30/16 take 5 mg twice daily 01/26/16   Caren Griffins, MD  azithromycin (ZITHROMAX) 250 MG tablet Take 1 tablet (250 mg total) by mouth daily. 03/29/16 04/02/16  Gareth Morgan, MD  benzonatate (TESSALON) 100 MG capsule Take 1 capsule (100 mg total) by mouth every 8 (eight) hours. 06/10/15    Shawn C Joy, PA-C  CALCIUM-VITAMIN D PO Take 1 tablet by mouth 2 (two) times daily.     Historical Provider, MD  doxycycline (VIBRAMYCIN) 100 MG capsule Take 1 capsule (100 mg total) by mouth 2 (two) times daily. 03/22/16   [name], MD  ferrous sulfate 325 (65 FE) MG tablet Take 1 tablet (325 mg total) by mouth 2 (two) times daily with a meal. 01/26/16   Costin Karlyne Greenspan, MD  furosemide (LASIX) 20 MG tablet Take 20 mg by mouth daily.    Historical Provider, MD  guaifenesin (ROBITUSSIN) 100 MG/5ML syrup Take 200 mg by mouth 3 (three) times daily as needed for cough.    Historical Provider, MD  hydrocortisone cream 1 % Apply to affected area 2 times daily  Patient taking differently: Apply 1 application topically 2 (two) times daily as needed for itching. Apply to affected area 2 times daily 10/05/14   Fransico Meadow, PA-C  isosorbide mononitrate (IMDUR) 30 MG 24 hr tablet Take 30 mg by mouth daily. 01/11/16   Historical Provider, MD  metoprolol succinate (TOPROL-XL) 25 MG 24 hr tablet Take 25 mg by mouth daily. 07/27/13   Historical Provider, MD  nitroGLYCERIN (NITROSTAT) 0.4 MG SL tablet Place 0.4 mg under the tongue every 5 (five) minutes as needed for chest pain.    Historical Provider, MD  Omega-3 Fatty Acids (FISH OIL) 1000 MG CAPS Take 1,000 mg by mouth at bedtime.     Historical Provider, MD  omeprazole (PRILOSEC) 40 MG capsule Take 1 capsule (40 mg total) by mouth 2 (two) times daily. 01/20/16   Orson Eva, MD  oxyCODONE (OXY IR/ROXICODONE) 5 MG immediate release tablet Take 1 tablet (5 mg total) by mouth every 4 (four) hours as needed for moderate pain. 01/26/16   Stella, MD  OXYGEN Inhale 2.5 L into the lungs continuous.    Historical Provider, MD  predniSONE (DELTASONE) 10 MG tablet Take 4 tablets (40 mg total) by mouth daily. 03/29/16 04/02/16  Gareth Morgan, MD  spironolactone (ALDACTONE) 25 MG tablet Take 0.5 tablets (12.5 mg total) by mouth daily. 01/21/16   Orson Eva, MD    vitamin B-12 (CYANOCOBALAMIN) 1000 MCG tablet Take 1,000 mcg by mouth 2 (two) times daily.    Historical Provider, MD    Family History Family History  Problem Relation Age of Onset  . Alzheimer's disease Father 81  . Cancer Father 27    metastatic prostate cancer  . Diabetes Sister 41  . Diabetes Brother   . Diabetes Brother   . Diabetes Brother   . Hypotension Neg Hx   . Malignant hyperthermia Neg Hx   . Pseudochol deficiency Neg Hx     Social History Social History  Substance Use Topics  . Smoking status: Former Smoker    Types: Cigarettes    Quit date: 03/12/1949  . Smokeless tobacco: Current User    Types: Chew  . Alcohol use No     Comment: drank until per pt 03/19/11, h/o heavy use     Allergies   Aspirin and Penicillins   Review of Systems Review of Systems  Constitutional: Negative for fever.  HENT: Negative for congestion and sore throat.   Eyes: Negative for visual disturbance.  Respiratory: Positive for cough, shortness of breath and wheezing.   Cardiovascular: Positive for chest pain (soreness with coughing).  Gastrointestinal: Negative for abdominal pain, nausea and vomiting.  Genitourinary: Negative for difficulty urinating.  Musculoskeletal: Negative for back pain.  Skin: Negative for rash.  Neurological: Negative for syncope and headaches.     Physical Exam Updated Vital Signs BP (!) 110/49   Pulse (!) 59   Temp 97.5 F (36.4 C) (Oral)   Resp 21   SpO2 100%   Physical Exam  Constitutional: He is oriented to person, place, and time. He appears well-developed and well-nourished. No distress.  Speaking in full sentences   HENT:  Head: Normocephalic and atraumatic.  Eyes: Conjunctivae and EOM are normal.  Neck: Normal range of motion. JVD present.  Cardiovascular: Normal rate, regular rhythm, normal heart sounds and intact distal pulses.  Exam reveals no gallop and no friction rub.   No murmur heard. Pulmonary/Chest: Effort normal. No  respiratory distress. He has wheezes (diffuse). He has  no rales.  Abdominal: Soft. He exhibits no distension. There is no tenderness. There is no guarding.  Musculoskeletal: He exhibits no edema.  Neurological: He is alert and oriented to person, place, and time.  Skin: Skin is warm and dry. He is not diaphoretic.  Nursing note and vitals reviewed.    ED Treatments / Results  Labs (all labs ordered are listed, but only abnormal results are displayed) Labs Reviewed  BASIC METABOLIC PANEL - Abnormal; Notable for the following:       Result Value   Chloride 97 (*)    Glucose, Bld 117 (*)    BUN 29 (*)    Creatinine, Ser 1.70 (*)    GFR calc non Af Amer 36 (*)    GFR calc Af Amer 42 (*)    All other components within normal limits  CBC - Abnormal; Notable for the following:    WBC 2.8 (*)    RBC 2.88 (*)    Hemoglobin 9.1 (*)    HCT 29.1 (*)    MCV 101.0 (*)    RDW 18.2 (*)    Platelets 93 (*)    All other components within normal limits  BRAIN NATRIURETIC PEPTIDE - Abnormal; Notable for the following:    B Natriuretic Peptide 2,710.1 (*)    All other components within normal limits  I-STAT TROPOININ, ED  I-STAT TROPOININ, ED    EKG  EKG Interpretation  Date/Time:  Thursday March 29 2016 13:37:30 EST Ventricular Rate:  61 PR Interval:    QRS Duration: 172 QT Interval:  516 QTC Calculation: 519 R Axis:   133 Text Interpretation:  Ventricular-paced rhythm Biventricular pacemaker detected Abnormal ECG No significant change since last tracing Confirmed by [facility] MD, Ferris Tally ([id number]) on 03/29/2016 4:47:23 PM       Radiology Dg Chest 2 View  Result Date: 03/29/2016 CLINICAL DATA:  Shortness of breath.  Productive cough for 1 month. EXAM: CHEST  2 VIEW COMPARISON:  March 21, 2016 FINDINGS: No pneumothorax. Stable AICD device. Stable cardiomegaly. Stable interstitial markings in the lungs and small left pleural effusion. No significant interval change. IMPRESSION: 1. No  interval change. Stable small left effusion. Interstitial prominence worsened since March of 2017 but stable since March 21, 2016. I suspect a component of edema. Electronically Signed   By: Dorise Bullion III M.D   On: 03/29/2016 15:28    Procedures Procedures (including critical care time)  Medications Ordered in ED Medications  ipratropium-albuterol (DUONEB) 0.5-2.5 (3) MG/3ML nebulizer solution 3 mL (3 mLs Nebulization Given 03/29/16 1843)  predniSONE (DELTASONE) tablet 60 mg (60 mg Oral Given 03/29/16 1734)  furosemide (LASIX) injection 20 mg (20 mg Intravenous Given 03/29/16 1735)  albuterol (PROVENTIL) (2.5 MG/3ML) 0.083% nebulizer solution 2.5 mg (2.5 mg Nebulization Given 03/29/16 2023)  azithromycin (ZITHROMAX) tablet 500 mg (500 mg Oral Given 03/29/16 2023)  albuterol (PROVENTIL HFA;VENTOLIN HFA) 108 (90 Base) MCG/ACT inhaler 2 puff (2 puffs Inhalation Given 03/29/16 2024)     Initial Impression / Assessment and Plan / ED Course  I have reviewed the triage vital signs and the nursing notes.  Pertinent labs & imaging results that were available during my care of the patient were reviewed by me and considered in my medical decision making (see chart for details).     83 year old male with a history of black lung disease, coronary artery disease, chronic atrial fibrillation, chronic systolic congestive heart failure, COPD on 2.5 L of oxygen, diabetes history of atrial thrombus  in November 2017 and acute lower leg ischemia requiring thromboembolectomy now on eliquis who presents with concern for dyspnea, cough, wheezing.  Patient had been seen one week ago for similar.  EKG unchanged, troponin negative x2. Doubt ACS. Doubt PE given more likely combined CHF/COPD by hx and physical.  Chest x-ray today is unchanged from last week and shows persisting interstitial prominence. Patient with wheezing on exam, and given nebulizer treatments and prednisone with improvement. He is on home oxygen,  with no hypoxia, no tachypnea. He is able to ambulate independently in the emergency department with normal oxygenation 99% immediately after completion and no respiratory distress.  Patient was given 20 mg IV Lasix as this is his home dose, and recommended to increase his home dose to 40 mg per day and follow up with his physician on Monday.  His BNP returned at 2710, which is significantly increased since his prior values in the fall of 2017, however is slightly decreased from 1 week ago.  His number is consistent with his congestive heart failure, however given patient not hypoxic on his home oxygen, not tachypneic, in no respiratory distress, feel continued outpatient therapy of both CHF and COPD exacerbation is appropriate.   His symptoms are likely combined congestive heart failure and COPD exacerbation. Rec lasix '40mg'$  with close PCP follow up, prednisone and azithromycin. Patient discharged in stable condition with understanding of reasons to return.     Final Clinical Impressions(s) / ED Diagnoses   Final diagnoses:  COPD exacerbation ([place])  Acute on chronic congestive heart failure, unspecified congestive heart failure type [facility])    New Prescriptions Discharge Medication List as of 03/29/2016  9:45 PM    START taking these medications   Details  azithromycin (ZITHROMAX) 250 MG tablet Take 1 tablet (250 mg total) by mouth daily., Starting Thu 03/29/2016, Until Mon 04/02/2016, Print    predniSONE (DELTASONE) 10 MG tablet Take 4 tablets (40 mg total) by mouth daily., Starting Thu 03/29/2016, Until Mon 04/02/2016, Print         Gareth Morgan, MD 03/30/16 0200

## 2016-03-29 NOTE — ED Notes (Addendum)
Pt was able to ambulate without assistance. Pt had no complaints while ambulating. Was able to speak in full sentences during ambulation. Once back in room pt O2 sat was 99%.

## 2016-03-29 NOTE — ED Triage Notes (Signed)
Pt sts SOB x several days with cough and pain in chest and ribs; pt on home O2

## 2016-03-29 NOTE — ED Notes (Signed)
Called patient 3x, no response.

## 2016-04-02 ENCOUNTER — Emergency Department (HOSPITAL_COMMUNITY): Payer: Medicare Other

## 2016-04-02 ENCOUNTER — Encounter (HOSPITAL_COMMUNITY): Payer: Self-pay | Admitting: Emergency Medicine

## 2016-04-02 ENCOUNTER — Inpatient Hospital Stay (HOSPITAL_COMMUNITY)
Admission: EM | Admit: 2016-04-02 | Discharge: 2016-04-06 | DRG: 291 | Disposition: A | Discharge status: Hospice | Payer: Medicare Other | Attending: Internal Medicine | Admitting: Internal Medicine

## 2016-04-02 DIAGNOSIS — Z888 Allergy status to other drugs, medicaments and biological substances status: Secondary | ICD-10-CM | POA: Diagnosis not present

## 2016-04-02 DIAGNOSIS — I482 Chronic atrial fibrillation: Secondary | ICD-10-CM | POA: Diagnosis present

## 2016-04-02 DIAGNOSIS — J60 Coalworker's pneumoconiosis: Secondary | ICD-10-CM | POA: Diagnosis present

## 2016-04-02 DIAGNOSIS — D61818 Other pancytopenia: Secondary | ICD-10-CM | POA: Diagnosis not present

## 2016-04-02 DIAGNOSIS — K219 Gastro-esophageal reflux disease without esophagitis: Secondary | ICD-10-CM | POA: Diagnosis not present

## 2016-04-02 DIAGNOSIS — E7800 Pure hypercholesterolemia, unspecified: Secondary | ICD-10-CM | POA: Diagnosis not present

## 2016-04-02 DIAGNOSIS — Z950 Presence of cardiac pacemaker: Secondary | ICD-10-CM | POA: Diagnosis present

## 2016-04-02 DIAGNOSIS — I2510 Atherosclerotic heart disease of native coronary artery without angina pectoris: Secondary | ICD-10-CM | POA: Diagnosis present

## 2016-04-02 DIAGNOSIS — Z9981 Dependence on supplemental oxygen: Secondary | ICD-10-CM | POA: Diagnosis not present

## 2016-04-02 DIAGNOSIS — Z85118 Personal history of other malignant neoplasm of bronchus and lung: Secondary | ICD-10-CM

## 2016-04-02 DIAGNOSIS — Z833 Family history of diabetes mellitus: Secondary | ICD-10-CM

## 2016-04-02 DIAGNOSIS — J9621 Acute and chronic respiratory failure with hypoxia: Secondary | ICD-10-CM

## 2016-04-02 DIAGNOSIS — Z8673 Personal history of transient ischemic attack (TIA), and cerebral infarction without residual deficits: Secondary | ICD-10-CM

## 2016-04-02 DIAGNOSIS — Z880 Allergy status to penicillin: Secondary | ICD-10-CM | POA: Diagnosis not present

## 2016-04-02 DIAGNOSIS — Z886 Allergy status to analgesic agent status: Secondary | ICD-10-CM

## 2016-04-02 DIAGNOSIS — M109 Gout, unspecified: Secondary | ICD-10-CM | POA: Diagnosis not present

## 2016-04-02 DIAGNOSIS — I5023 Acute on chronic systolic (congestive) heart failure: Secondary | ICD-10-CM

## 2016-04-02 DIAGNOSIS — Z8042 Family history of malignant neoplasm of prostate: Secondary | ICD-10-CM

## 2016-04-02 DIAGNOSIS — E1122 Type 2 diabetes mellitus with diabetic chronic kidney disease: Secondary | ICD-10-CM | POA: Diagnosis present

## 2016-04-02 DIAGNOSIS — I255 Ischemic cardiomyopathy: Secondary | ICD-10-CM | POA: Diagnosis not present

## 2016-04-02 DIAGNOSIS — E876 Hypokalemia: Secondary | ICD-10-CM | POA: Diagnosis not present

## 2016-04-02 DIAGNOSIS — Z87891 Personal history of nicotine dependence: Secondary | ICD-10-CM

## 2016-04-02 DIAGNOSIS — R05 Cough: Secondary | ICD-10-CM | POA: Diagnosis not present

## 2016-04-02 DIAGNOSIS — Z79899 Other long term (current) drug therapy: Secondary | ICD-10-CM

## 2016-04-02 DIAGNOSIS — M7989 Other specified soft tissue disorders: Secondary | ICD-10-CM | POA: Diagnosis present

## 2016-04-02 DIAGNOSIS — I130 Hypertensive heart and chronic kidney disease with heart failure and stage 1 through stage 4 chronic kidney disease, or unspecified chronic kidney disease: Principal | ICD-10-CM | POA: Diagnosis present

## 2016-04-02 DIAGNOSIS — Z820 Family history of epilepsy and other diseases of the nervous system: Secondary | ICD-10-CM

## 2016-04-02 DIAGNOSIS — J441 Chronic obstructive pulmonary disease with (acute) exacerbation: Secondary | ICD-10-CM | POA: Diagnosis not present

## 2016-04-02 DIAGNOSIS — D539 Nutritional anemia, unspecified: Secondary | ICD-10-CM | POA: Diagnosis not present

## 2016-04-02 DIAGNOSIS — Z87442 Personal history of urinary calculi: Secondary | ICD-10-CM

## 2016-04-02 DIAGNOSIS — N179 Acute kidney failure, unspecified: Secondary | ICD-10-CM | POA: Diagnosis present

## 2016-04-02 DIAGNOSIS — I160 Hypertensive urgency: Secondary | ICD-10-CM | POA: Diagnosis present

## 2016-04-02 DIAGNOSIS — N183 Chronic kidney disease, stage 3 unspecified: Secondary | ICD-10-CM | POA: Diagnosis present

## 2016-04-02 DIAGNOSIS — R001 Bradycardia, unspecified: Secondary | ICD-10-CM | POA: Diagnosis present

## 2016-04-02 DIAGNOSIS — J9620 Acute and chronic respiratory failure, unspecified whether with hypoxia or hypercapnia: Secondary | ICD-10-CM

## 2016-04-02 DIAGNOSIS — Z7189 Other specified counseling: Secondary | ICD-10-CM

## 2016-04-02 DIAGNOSIS — Z7901 Long term (current) use of anticoagulants: Secondary | ICD-10-CM

## 2016-04-02 DIAGNOSIS — Z955 Presence of coronary angioplasty implant and graft: Secondary | ICD-10-CM

## 2016-04-02 DIAGNOSIS — Z95810 Presence of automatic (implantable) cardiac defibrillator: Secondary | ICD-10-CM

## 2016-04-02 DIAGNOSIS — R0602 Shortness of breath: Secondary | ICD-10-CM | POA: Diagnosis not present

## 2016-04-02 DIAGNOSIS — I459 Conduction disorder, unspecified: Secondary | ICD-10-CM | POA: Diagnosis not present

## 2016-04-02 DIAGNOSIS — I5043 Acute on chronic combined systolic (congestive) and diastolic (congestive) heart failure: Secondary | ICD-10-CM | POA: Diagnosis not present

## 2016-04-02 DIAGNOSIS — Z515 Encounter for palliative care: Secondary | ICD-10-CM | POA: Diagnosis not present

## 2016-04-02 DIAGNOSIS — Z8546 Personal history of malignant neoplasm of prostate: Secondary | ICD-10-CM

## 2016-04-02 DIAGNOSIS — Z66 Do not resuscitate: Secondary | ICD-10-CM | POA: Diagnosis not present

## 2016-04-02 DIAGNOSIS — Z951 Presence of aortocoronary bypass graft: Secondary | ICD-10-CM

## 2016-04-02 DIAGNOSIS — I509 Heart failure, unspecified: Secondary | ICD-10-CM | POA: Insufficient documentation

## 2016-04-02 LAB — BASIC METABOLIC PANEL
Anion gap: 18 — ABNORMAL HIGH (ref 5–15)
BUN: 34 mg/dL — AB (ref 6–20)
CHLORIDE: 97 mmol/L — AB (ref 101–111)
CO2: 20 mmol/L — AB (ref 22–32)
CREATININE: 1.77 mg/dL — AB (ref 0.61–1.24)
Calcium: 9.4 mg/dL (ref 8.9–10.3)
GFR calc Af Amer: 40 mL/min — ABNORMAL LOW (ref >60)
GFR calc non Af Amer: 34 mL/min — ABNORMAL LOW (ref >60)
GLUCOSE: 111 mg/dL — AB (ref 65–99)
Potassium: 4.6 mmol/L (ref 3.5–5.1)
Sodium: 135 mmol/L (ref 135–145)

## 2016-04-02 LAB — CBC
HCT: 30.3 % — ABNORMAL LOW (ref 39.0–52.0)
Hemoglobin: 9.4 g/dL — ABNORMAL LOW (ref 13.0–17.0)
MCH: 31.2 pg (ref 26.0–34.0)
MCHC: 31 g/dL (ref 30.0–36.0)
MCV: 100.7 fL — AB (ref 78.0–100.0)
PLATELETS: 130 10*3/uL — AB (ref 150–400)
RBC: 3.01 MIL/uL — AB (ref 4.22–5.81)
RDW: 18.1 % — AB (ref 11.5–15.5)
WBC: 7.9 10*3/uL (ref 4.0–10.5)

## 2016-04-02 LAB — MRSA PCR SCREENING: MRSA BY PCR: NEGATIVE

## 2016-04-02 LAB — I-STAT TROPONIN, ED
TROPONIN I, POC: 0.03 ng/mL (ref 0.00–0.08)
Troponin i, poc: 0.03 ng/mL (ref 0.00–0.08)

## 2016-04-02 LAB — BRAIN NATRIURETIC PEPTIDE: B Natriuretic Peptide: 3007.2 pg/mL — ABNORMAL HIGH (ref 0.0–100.0)

## 2016-04-02 LAB — PROCALCITONIN

## 2016-04-02 MED ORDER — PREDNISONE 50 MG PO TABS
60.0000 mg | ORAL_TABLET | Freq: Every day | ORAL | Status: DC
  Administered 2016-04-03 – 2016-04-06 (×4): 60 mg via ORAL
  Filled 2016-04-02 (×4): qty 1

## 2016-04-02 MED ORDER — SPIRONOLACTONE 25 MG PO TABS
12.5000 mg | ORAL_TABLET | Freq: Every day | ORAL | Status: DC
  Administered 2016-04-03 – 2016-04-06 (×4): 12.5 mg via ORAL
  Filled 2016-04-02 (×6): qty 1

## 2016-04-02 MED ORDER — FUROSEMIDE 10 MG/ML IJ SOLN
60.0000 mg | Freq: Once | INTRAMUSCULAR | Status: AC
  Administered 2016-04-02: 60 mg via INTRAVENOUS
  Filled 2016-04-02: qty 6

## 2016-04-02 MED ORDER — ISOSORBIDE MONONITRATE ER 30 MG PO TB24
30.0000 mg | ORAL_TABLET | Freq: Every day | ORAL | Status: DC
  Administered 2016-04-02 – 2016-04-05 (×4): 30 mg via ORAL
  Filled 2016-04-02 (×4): qty 1

## 2016-04-02 MED ORDER — PANTOPRAZOLE SODIUM 40 MG PO TBEC
80.0000 mg | DELAYED_RELEASE_TABLET | Freq: Every day | ORAL | Status: DC
  Administered 2016-04-03 – 2016-04-06 (×4): 80 mg via ORAL
  Filled 2016-04-02 (×4): qty 2

## 2016-04-02 MED ORDER — BENZONATATE 100 MG PO CAPS
100.0000 mg | ORAL_CAPSULE | Freq: Three times a day (TID) | ORAL | Status: DC
  Administered 2016-04-02 – 2016-04-06 (×12): 100 mg via ORAL
  Filled 2016-04-02 (×12): qty 1

## 2016-04-02 MED ORDER — ONDANSETRON HCL 4 MG PO TABS: 4.0000 mg | ORAL_TABLET | Freq: Four times a day (QID) | ORAL | Status: DC | PRN

## 2016-04-02 MED ORDER — SODIUM CHLORIDE 0.9% FLUSH: 3.0000 mL | INTRAVENOUS | Status: DC | PRN

## 2016-04-02 MED ORDER — FUROSEMIDE 10 MG/ML IJ SOLN
60.0000 mg | Freq: Two times a day (BID) | INTRAMUSCULAR | Status: DC
  Administered 2016-04-02 – 2016-04-03 (×2): 60 mg via INTRAVENOUS
  Filled 2016-04-02 (×2): qty 6

## 2016-04-02 MED ORDER — LEVOFLOXACIN IN D5W 750 MG/150ML IV SOLN
750.0000 mg | INTRAVENOUS | Status: DC
  Administered 2016-04-02 – 2016-04-04 (×2): 750 mg via INTRAVENOUS
  Filled 2016-04-02 (×4): qty 150

## 2016-04-02 MED ORDER — ALLOPURINOL 300 MG PO TABS
300.0000 mg | ORAL_TABLET | Freq: Every day | ORAL | Status: DC
  Administered 2016-04-03 – 2016-04-06 (×4): 300 mg via ORAL
  Filled 2016-04-02 (×5): qty 1

## 2016-04-02 MED ORDER — ONDANSETRON HCL 4 MG/2ML IJ SOLN: 4.0000 mg | Freq: Four times a day (QID) | INTRAMUSCULAR | Status: DC | PRN

## 2016-04-02 MED ORDER — METHYLPREDNISOLONE SODIUM SUCC 125 MG IJ SOLR
125.0000 mg | Freq: Once | INTRAMUSCULAR | Status: AC
  Administered 2016-04-02: 125 mg via INTRAVENOUS
  Filled 2016-04-02: qty 2

## 2016-04-02 MED ORDER — ACETAMINOPHEN 650 MG RE SUPP: 650.0000 mg | Freq: Four times a day (QID) | RECTAL | Status: DC | PRN

## 2016-04-02 MED ORDER — ACETAMINOPHEN 325 MG PO TABS
650.0000 mg | ORAL_TABLET | Freq: Four times a day (QID) | ORAL | Status: DC | PRN
  Administered 2016-04-05: 650 mg via ORAL
  Filled 2016-04-02: qty 2

## 2016-04-02 MED ORDER — IPRATROPIUM-ALBUTEROL 0.5-2.5 (3) MG/3ML IN SOLN
3.0000 mL | Freq: Once | RESPIRATORY_TRACT | Status: AC
  Administered 2016-04-02: 3 mL via RESPIRATORY_TRACT
  Filled 2016-04-02: qty 3

## 2016-04-02 MED ORDER — IPRATROPIUM-ALBUTEROL 0.5-2.5 (3) MG/3ML IN SOLN: 3.0000 mL | RESPIRATORY_TRACT | Status: DC | PRN

## 2016-04-02 MED ORDER — SODIUM CHLORIDE 0.9% FLUSH
3.0000 mL | Freq: Two times a day (BID) | INTRAVENOUS | Status: DC
  Administered 2016-04-02 – 2016-04-06 (×8): 3 mL via INTRAVENOUS

## 2016-04-02 MED ORDER — APIXABAN 2.5 MG PO TABS
2.5000 mg | ORAL_TABLET | Freq: Two times a day (BID) | ORAL | Status: DC
  Administered 2016-04-02 – 2016-04-06 (×8): 2.5 mg via ORAL
  Filled 2016-04-02 (×8): qty 1

## 2016-04-02 MED ORDER — NITROGLYCERIN 0.4 MG SL SUBL
0.4000 mg | SUBLINGUAL_TABLET | SUBLINGUAL | Status: DC | PRN
  Filled 2016-04-02: qty 1

## 2016-04-02 MED ORDER — SODIUM CHLORIDE 0.9 % IV SOLN: 250.0000 mL | INTRAVENOUS | Status: DC | PRN

## 2016-04-02 MED ORDER — OXYCODONE HCL 5 MG PO TABS: 5.0000 mg | ORAL_TABLET | ORAL | Status: DC | PRN

## 2016-04-02 MED ORDER — IPRATROPIUM-ALBUTEROL 0.5-2.5 (3) MG/3ML IN SOLN
3.0000 mL | RESPIRATORY_TRACT | Status: DC
  Administered 2016-04-02 (×2): 3 mL via RESPIRATORY_TRACT
  Filled 2016-04-02 (×2): qty 3

## 2016-04-02 MED ORDER — ALBUTEROL SULFATE (2.5 MG/3ML) 0.083% IN NEBU
2.5000 mg | INHALATION_SOLUTION | RESPIRATORY_TRACT | Status: DC | PRN
  Administered 2016-04-03: 2.5 mg via RESPIRATORY_TRACT
  Filled 2016-04-02: qty 3

## 2016-04-02 MED ORDER — METOPROLOL SUCCINATE ER 25 MG PO TB24
25.0000 mg | ORAL_TABLET | Freq: Every day | ORAL | Status: DC
  Administered 2016-04-02 – 2016-04-03 (×2): 25 mg via ORAL
  Filled 2016-04-02 (×2): qty 1

## 2016-04-02 MED ORDER — APIXABAN 5 MG PO TABS: 5.0000 mg | ORAL_TABLET | Freq: Two times a day (BID) | ORAL | Status: DC

## 2016-04-02 MED ORDER — IPRATROPIUM-ALBUTEROL 0.5-2.5 (3) MG/3ML IN SOLN
3.0000 mL | Freq: Four times a day (QID) | RESPIRATORY_TRACT | Status: DC
  Administered 2016-04-03: 3 mL via RESPIRATORY_TRACT
  Filled 2016-04-02: qty 3

## 2016-04-02 NOTE — ED Provider Notes (Signed)
[facility] DEPT Provider Note   CSN: [id number] Arrival date & time: 04/02/16  1191     History   Chief Complaint Chief Complaint  Patient presents with  . Shortness of Breath    HPI [name] is a 83 y.o. male.  HPI  83 year old male with a history of black lung disease, coronary artery disease, chronic atrial fibrillation, chronic systolic congestive heart failure, COPD on 2.5 L of oxygen, diabetes history of atrial thrombus in November 2017 and acute lower leg ischemia requiring thromboembolectomy now on eliquis who presents with concern for dyspnea, cough, wheezing.  Patient had been seen several times in the last few weeks for similar, most recently seen by me 3 days ago for concern for combined CHF/COPD, given lasix, told to increase lasix, take prednisone/azithromycin and follow up with PCP, however presents again today with dyspnea.   Shortness of breath worsening over the last 3 days. Has been taking 40 lasix and breathing treatments but no changes. Not sleeping well, significant orthopnea, Sitting up in bed to sleep. Increasing swelling of bilateral lower extremities. Bilateral chest soreness. Coughing up white mucus. No fever.  Past Medical History:  Diagnosis Date  . Arthritis   . Black lung disease ([place])   . CAD (coronary artery disease)    a. 07/2002 CABG x 3: LIMA->LAD, VG->Diag, VG->OM;  b. 06/2006 Cath: LM 50-60ost/p, LAD patent mid stent, D1 sev dzs, D2 patent stent, LCX nl, OM2 sev sten prox, RCA large/nl, VG->Diag nl, VG->OM nl, LIMA->LAD atretic, EF 30%.  . Carotid artery occlusion   . Chronic atrial fibrillation ([place])   . Chronic respiratory failure ([place])   . Chronic systolic CHF (congestive heart failure) ([place])    a. 12/2012 Echo: EF 20-25%, mid-dist antsept AK, mod dil LA.  [name] COPD (chronic obstructive pulmonary disease) ([place])    a. On home O2.  [name] CVA (cerebral vascular accident) ([place])   . Diabetes mellitus   . GERD (gastroesophageal reflux  disease)   . Gout   . Hypercholesterolemia   . Hypertension   . Ischemic cardiomyopathy    a. 06/2006  BIV-ICD -> gen change 2015 to [place].  . Lung cancer ([place])   . Nephrolithiasis 09/2000  . On home oxygen therapy    "2.5L; 24/7" (01/16/2016)  . Prostate cancer ([place]) 11/01/10   gleason 7, 8, 9, gold seeds 02/08/11  . Symptomatic bradycardia     Patient Active Problem List   Diagnosis Date Noted  . CHF exacerbation ([place]) 04/02/2016  . Acute on chronic respiratory failure ([place]) 04/02/2016  . COPD exacerbation ([place]) 04/02/2016  . Macrocytic anemia 04/02/2016  . Arterial thrombosis ([place]) 01/20/2016  . DM2 (diabetes mellitus, type 2) ([place]) 01/20/2016  . Critical lower limb ischemia 01/20/2016  . Ischemic leg 01/20/2016  . Chronic renal disease, stage III 01/19/2016  . Chronic anticoagulation-Xarelto 01/19/2016  . Cardiomyopathy, ischemic 01/19/2016  . Acute on chronic systolic CHF (congestive heart failure) ([place]) 01/18/2016  . Anemia 01/16/2016  . Black lung disease ([place]) 10/27/2015  . Pain in the chest   . Hx of CABG x 3 2004 after multiple PCI's in'90s   . Chronic respiratory failure 08/08/2013  . CAP (community acquired pneumonia) 08/07/2013  . Pneumonia 08/07/2013  . Occlusion and stenosis of carotid artery without mention of cerebral infarction 05/26/2013  . Syncope 02/15/2013  . Weakness generalized 05/14/2011  . On home oxygen therapy   . Prostate cancer ([place])   . Pacemaker   . Hematochezia  02/19/2011  . Melena 02/19/2011  . Prostate cancer, recur risk not determined whether low, med or high ([place])   . Systolic CHF, chronic ([place]) 02/16/2011  . SBO (small bowel obstruction) 02/15/2011  . ARF (acute renal failure) ([place]) 02/15/2011  . UTI (lower urinary tract infection) 02/15/2011  . Syncope and collapse 02/10/2011  . S/P [employer] Jude BiV ICD upgrade 2015 05/24/2009  . Cardiac conduction disorder 06/01/2008  . Atrial fibrillation ([place]) 06/01/2008    Past Surgical  History:  Procedure Laterality Date  . BI-VENTRICULAR PACEMAKER INSERTION (CRT-P)  12-02-13   downgrade of previously implanted CRTD to STJ CRTP  . BIV PACEMAKER GENERATOR CHANGE OUT N/A 12/02/2013   Procedure: BIV PACEMAKER GENERATOR CHANGE OUT;  Surgeon: Evans Lance, MD;  Location: [facility] CATH LAB;  Service: Cardiovascular;  Laterality: N/A;  . COLONOSCOPY    . CORONARY ANGIOPLASTY WITH STENT PLACEMENT  07/1997; 08/1997;03/1998  . CORONARY ARTERY BYPASS GRAFT  07/2002   CABG X3  . ESOPHAGOGASTRODUODENOSCOPY  02/19/2011   Procedure: ESOPHAGOGASTRODUODENOSCOPY (EGD);  Surgeon: Jeryl Columbia, MD;  Location: [facility] ENDOSCOPY;  Service: Endoscopy;  Laterality: N/A;  . FEMORAL-POPLITEAL BYPASS GRAFT Right 01/20/2016   Procedure: THROMBECTOMY OF POPLITEAL ARTERY AND ANTERIOR TIBIAL ARTERY RIGHT LEG; INTRAOPERATIVE ARTERIOGRAM;  Surgeon: Waynetta Sandy, MD;  Location: [facility];  Service: Vascular;  Laterality: Right;  . INCISION AND DRAINAGE OF WOUND  08/2002   right thigh; S/P EVH  . INSERT / REPLACE / [place]; 1992; 01/2000;  . INSERT / REPLACE / REMOVE PACEMAKER  09/2003; 06/2006   w/AICD  . INSERT / REPLACE / REMOVE PACEMAKER  12/2004   pacmaker explant  . SHOULDER ARTHROSCOPY W/ ROTATOR CUFF REPAIR  05/2008   left  . upper endoscpopy         Home Medications    Prior to Admission medications   Medication Sig Start Date End Date Taking? Authorizing Provider  acetaminophen (TYLENOL) 500 MG tablet Take 500 mg by mouth every 6 (six) hours as needed (pain).   Yes Historical Provider, MD  albuterol (PROVENTIL HFA;VENTOLIN HFA) 108 (90 Base) MCG/ACT inhaler Inhale 2 puffs into the lungs every 4 (four) hours as needed for wheezing or shortness of breath. 06/10/15  Yes Shawn C Joy, PA-C  albuterol (PROVENTIL) (2.5 MG/3ML) 0.083% nebulizer solution Take 2.5 mg by nebulization every 6 (six) hours as needed for wheezing or shortness of breath.   Yes Historical Provider, MD  allopurinol  (ZYLOPRIM) 300 MG tablet Take 300 mg by mouth daily.     Yes Historical Provider, MD  apixaban (ELIQUIS) 5 MG TABS tablet Take 2 tablets (10 mg total) by mouth 2 (two) times daily. For 3 more days. On Monday 01/30/16 take 5 mg twice daily Patient taking differently: Take 5 mg by mouth 2 (two) times daily.  01/26/16  Yes Costin Karlyne Greenspan, MD  CALCIUM-VITAMIN D PO Take 1 tablet by mouth 2 (two) times daily.    Yes Historical Provider, MD  ferrous sulfate 325 (65 FE) MG tablet Take 1 tablet (325 mg total) by mouth 2 (two) times daily with a meal. 01/26/16  Yes Costin Karlyne Greenspan, MD  furosemide (LASIX) 20 MG tablet Take 20 mg by mouth daily.   Yes Historical Provider, MD  guaifenesin (ROBITUSSIN) 100 MG/5ML syrup Take 200 mg by mouth 3 (three) times daily as needed for cough or congestion.    Yes Historical Provider, MD  hydrocortisone cream 1 % Apply to affected area 2  times daily Patient taking differently: Apply 1 application topically 2 (two) times daily as needed for itching.  10/05/14  Yes Hollace Kinnier Sofia, PA-C  isosorbide mononitrate (IMDUR) 30 MG 24 hr tablet Take 30 mg by mouth daily. 01/11/16  Yes Historical Provider, MD  metoprolol succinate (TOPROL-XL) 25 MG 24 hr tablet Take 25 mg by mouth daily. 07/27/13  Yes Historical Provider, MD  nitroGLYCERIN (NITROSTAT) 0.4 MG SL tablet Place 0.4 mg under the tongue every 5 (five) minutes as needed for chest pain.   Yes Historical Provider, MD  Omega-3 Fatty Acids (FISH OIL) 1000 MG CAPS Take 1,000 mg by mouth at bedtime.    Yes Historical Provider, MD  omeprazole (PRILOSEC) 40 MG capsule Take 1 capsule (40 mg total) by mouth 2 (two) times daily. 01/20/16  Yes Orson Eva, MD  oxyCODONE (OXY IR/ROXICODONE) 5 MG immediate release tablet Take 1 tablet (5 mg total) by mouth every 4 (four) hours as needed for moderate pain. 01/26/16  Yes Costin Karlyne Greenspan, MD  OXYGEN Inhale 2.5 L into the lungs continuous.   Yes Historical Provider, MD  spironolactone (ALDACTONE)  25 MG tablet Take 0.5 tablets (12.5 mg total) by mouth daily. 01/21/16  Yes Orson Eva, MD  vitamin B-12 (CYANOCOBALAMIN) 1000 MCG tablet Take 1,000 mcg by mouth 2 (two) times daily.   Yes Historical Provider, MD  azithromycin (ZITHROMAX) 250 MG tablet Take 1 tablet (250 mg total) by mouth daily. Patient not taking: Reported on 04/02/2016 03/29/16 04/02/16  Gareth Morgan, MD  benzonatate (TESSALON) 100 MG capsule Take 1 capsule (100 mg total) by mouth every 8 (eight) hours. 06/10/15   Shawn C Joy, PA-C  doxycycline (VIBRAMYCIN) 100 MG capsule Take 1 capsule (100 mg total) by mouth 2 (two) times daily. Patient not taking: Reported on 04/02/2016 03/22/16   [name], MD  predniSONE (DELTASONE) 10 MG tablet Take 4 tablets (40 mg total) by mouth daily. Patient not taking: Reported on 04/02/2016 03/29/16 04/02/16  Gareth Morgan, MD    Family History Family History  Problem Relation Age of Onset  . Alzheimer's disease Father 96  . Cancer Father 73    metastatic prostate cancer  . Diabetes Sister 63  . Diabetes Brother   . Diabetes Brother   . Diabetes Brother   . Hypotension Neg Hx   . Malignant hyperthermia Neg Hx   . Pseudochol deficiency Neg Hx     Social History Social History  Substance Use Topics  . Smoking status: Former Smoker    Types: Cigarettes    Quit date: 03/12/1949  . Smokeless tobacco: Current User    Types: Chew  . Alcohol use No     Comment: drank until per pt 03/19/11, h/o heavy use     Allergies   Digitek [digoxin]; Aspirin; and Penicillins   Review of Systems Review of Systems  Constitutional: Negative for fever.  HENT: Negative for sore throat.   Eyes: Negative for visual disturbance.  Respiratory: Positive for cough, shortness of breath and wheezing.   Cardiovascular: Positive for chest pain (soreness) and leg swelling.  Gastrointestinal: Negative for abdominal pain, nausea and vomiting.  Genitourinary: Negative for difficulty urinating.  Musculoskeletal:  Negative for back pain and neck stiffness.  Skin: Negative for rash.  Neurological: Negative for syncope and headaches.     Physical Exam Updated Vital Signs BP 137/66 (BP Location: Left Arm)   Pulse 64   Temp 97.7 F (36.5 C) (Oral)   Resp (!) 22  Ht 5' (1.524 m)   Wt 124 lb (56.2 kg)   SpO2 100%   BMI 24.22 kg/m   Physical Exam  Constitutional: He is oriented to person, place, and time. He appears well-developed and well-nourished. No distress.  HENT:  Head: Normocephalic and atraumatic.  Eyes: Conjunctivae and EOM are normal.  Neck: Normal range of motion. JVD present.  Cardiovascular: Normal rate, regular rhythm, normal heart sounds and intact distal pulses.  Exam reveals no gallop and no friction rub.   No murmur heard. Pulmonary/Chest: No respiratory distress. He has wheezes. He has rales.  Mild tachypnea  Abdominal: Soft. He exhibits no distension. There is no tenderness. There is no guarding.  Musculoskeletal: He exhibits edema (2+bilaterally).  Neurological: He is alert and oriented to person, place, and time.  Skin: Skin is warm and dry. He is not diaphoretic.  Nursing note and vitals reviewed.    ED Treatments / Results  Labs (all labs ordered are listed, but only abnormal results are displayed) Labs Reviewed  BASIC METABOLIC PANEL - Abnormal; Notable for the following:       Result Value   Chloride 97 (*)    CO2 20 (*)    Glucose, Bld 111 (*)    BUN 34 (*)    Creatinine, Ser 1.77 (*)    GFR calc non Af Amer 34 (*)    GFR calc Af Amer 40 (*)    Anion gap 18 (*)    All other components within normal limits  CBC - Abnormal; Notable for the following:    RBC 3.01 (*)    Hemoglobin 9.4 (*)    HCT 30.3 (*)    MCV 100.7 (*)    RDW 18.1 (*)    Platelets 130 (*)    All other components within normal limits  BRAIN NATRIURETIC PEPTIDE - Abnormal; Notable for the following:    B Natriuretic Peptide 3,007.2 (*)    All other components within normal limits   CULTURE, EXPECTORATED SPUTUM-ASSESSMENT  MRSA PCR SCREENING  PROCALCITONIN  BASIC METABOLIC PANEL  CBC  I-STAT TROPOININ, ED  I-STAT TROPOININ, ED    EKG  EKG Interpretation  Date/Time:  Monday April 02 2016 11:09:55 EST Ventricular Rate:  60 PR Interval:    QRS Duration: 159 QT Interval:  473 QTC Calculation: 473 R Axis:   -95 Text Interpretation:  Suspect paced rhythm however QRS with narrow appearance and cahnge in orientation in comparison to prior in anterior leads  and anterior TW inversion Confirmed by [facility] MD, Velita Quirk ([id number]) on 04/02/2016 11:21:57 AM       Radiology Dg Chest 2 View  Result Date: 04/02/2016 CLINICAL DATA:  Cough, shortness of breath, history of CHF, COPD, and black lung disease. Previous CABG. ICD. EXAM: CHEST  2 VIEW FINDINGS: The lungs are adequately inflated. The interstitial infiltrates observed previously are now more alveolar in nature. The cardiac silhouette remains enlarged. The pulmonary vascularity is more engorged today. There is blunting of the left costophrenic angles. The ICD is in stable position. There is calcification in the wall of the aortic arch. The sternal wires are intact. A unused right ventricular electrode is again demonstrated. There are degenerative changes of both shoulders. There is moderate multilevel degenerative disc disease of the thoracic spine. IMPRESSION: CHF with interstitial and alveolar opacities superimposed upon chronic interstitial changes. Electronically Signed   By: David  Martinique M.D.   On: 04/02/2016 09:58    Procedures Procedures (including critical care time)  Medications  Ordered in ED Medications  nitroGLYCERIN (NITROSTAT) SL tablet 0.4 mg (not administered)  sodium chloride flush (NS) 0.9 % injection 3 mL (not administered)  sodium chloride flush (NS) 0.9 % injection 3 mL (not administered)  0.9 %  sodium chloride infusion (not administered)  acetaminophen (TYLENOL) tablet 650 mg (not administered)     Or  acetaminophen (TYLENOL) suppository 650 mg (not administered)  ondansetron (ZOFRAN) tablet 4 mg (not administered)    Or  ondansetron (ZOFRAN) injection 4 mg (not administered)  furosemide (LASIX) injection 60 mg (not administered)  allopurinol (ZYLOPRIM) tablet 300 mg (not administered)  benzonatate (TESSALON) capsule 100 mg (not administered)  isosorbide mononitrate (IMDUR) 24 hr tablet 30 mg (not administered)  metoprolol succinate (TOPROL-XL) 24 hr tablet 25 mg (not administered)  pantoprazole (PROTONIX) EC tablet 80 mg (not administered)  oxyCODONE (Oxy IR/ROXICODONE) immediate release tablet 5 mg (not administered)  spironolactone (ALDACTONE) tablet 12.5 mg (not administered)  predniSONE (DELTASONE) tablet 60 mg (not administered)  ipratropium-albuterol (DUONEB) 0.5-2.5 (3) MG/3ML nebulizer solution 3 mL (3 mLs Nebulization Given 04/02/16 1548)  albuterol (PROVENTIL) (2.5 MG/3ML) 0.083% nebulizer solution 2.5 mg (not administered)  levofloxacin (LEVAQUIN) IVPB 750 mg (not administered)  apixaban (ELIQUIS) tablet 2.5 mg (not administered)  furosemide (LASIX) injection 60 mg (60 mg Intravenous Given 04/02/16 1200)  methylPREDNISolone sodium succinate (SOLU-MEDROL) 125 mg/2 mL injection 125 mg (125 mg Intravenous Given 04/02/16 1200)  ipratropium-albuterol (DUONEB) 0.5-2.5 (3) MG/3ML nebulizer solution 3 mL (3 mLs Nebulization Given 04/02/16 1144)     Initial Impression / Assessment and Plan / ED Course  I have reviewed the triage vital signs and the nursing notes.  Pertinent labs & imaging results that were available during my care of the patient were reviewed by me and considered in my medical decision making (see chart for details).    83 year old male with a history of black lung disease, coronary artery disease, chronic atrial fibrillation, chronic systolic congestive heart failure, COPD on 2.5 L of oxygen, diabetes history of atrial thrombus in November 2017 and acute lower  leg ischemia requiring thromboembolectomy now on eliquis who presents with concern for dyspnea, cough, wheezing.  Patient had been seen several times in the last few weeks for similar, most recently seen by me 3 days ago for concern for combined CHF/COPD, given lasix, told to increase lasix, take prednisone/azithromycin and follow up with PCP, however presents again today with dyspnea.  EKG unchanged.  Troponin negative.   Doubt ACS. Doubt PE given more likely combined CHF/COPD by hx and physical.  BNP increased from days ago to 3000. CXR with worsening pulmonary edema. Pt with increased O2 requirement today. Patient with failure of outpatient diuresis. Suspect trend in renal function secondary to worsening heart failure. Given IV lasix 60, solumedrol, duonebs, SL nitro for CHF exacerbation and COPD.  Will admit for concern for CHF exacerbation.    Final Clinical Impressions(s) / ED Diagnoses   Final diagnoses:  Acute on chronic combined systolic and diastolic CHF (congestive heart failure) ([place])  COPD exacerbation [facility])    New Prescriptions Current Discharge Medication List       Gareth Morgan, MD 04/02/16 [phone]

## 2016-04-02 NOTE — Consult Note (Signed)
Advanced Heart Failure Team Consult Note  Referring Physician: Dr Marily Memos Primary Physician: Dr Jeneen Rinks  Primary Cardiologist:  Dr Lovena Le   Reason for Consultation: A/C Systolic Heart Failure   HPI:   ICHAEL PULLARA is a 83 y.o. male with medical history significant of CAD, chronic respiratory failure on O2 secondary to advanced COPD, diabetes, CVA, GERD, HLD, HTN, prostate cancer, black lung diseased, nephrolithiasis, and chronic systolic HF with EF 70-62% s/p ST jude CRT-D .   He has been evaluated in [facility] ED on 2 other occasions this month for increased dyspnea. Treated with antibiotics+ prednisone. Over the last couple of days he reports increased dyspnea, orthopnea, and PND. Productive cough with white sputum. Able to walk a few steps. He increased lasix from 20 mg to 40 mg but had no relief. Weight at home ~124 pounds. Chronically on 2.5 liters oxygen at home. Say he has been taking all medications.   Today he presented with increased dyspnea. CXR with pulmonary edema. Pertinent admission labs include: BNP 3000, Na 135, Creatinine 1.77, Hgb 10.7, and troponin 0.03.  In the ED he was given solumedrol, duonebs, and IV lasix.   Review of Systems: [y] = yes, '[ ]'$  = no   General: Weight gain [Y ]; Weight loss '[ ]'$ ; Anorexia '[ ]'$ ; Fatigue Y'[ ]'$ ; Fever '[ ]'$ ; Chills '[ ]'$ ; Weakness [Y ]  Cardiac: Chest pain/pressure '[ ]'$ ; Resting SOB '[ ]'$ ; Exertional SOB [Y ]; Orthopnea Y'[ ]'$ ; Pedal Edema [Y ]; Palpitations '[ ]'$ ; Syncope '[ ]'$ ; Presyncope '[ ]'$ ; Paroxysmal nocturnal dyspnea'[ ]'$   Pulmonary: Cough Jazmín.Cullens ]; Wheezing[ Y]; Hemoptysis'[ ]'$ ; Sputum '[ ]'$ ; Snoring '[ ]'$   GI: Vomiting'[ ]'$ ; Dysphagia'[ ]'$ ; Melena'[ ]'$ ; Hematochezia '[ ]'$ ; Heartburn'[ ]'$ ; Abdominal pain '[ ]'$ ; Constipation '[ ]'$ ; Diarrhea '[ ]'$ ; BRBPR '[ ]'$   GU: Hematuria'[ ]'$ ; Dysuria '[ ]'$ ; Nocturia'[ ]'$   Vascular: Pain in legs with walking '[ ]'$ ; Pain in feet with lying flat '[ ]'$ ; Non-healing sores '[ ]'$ ; Stroke '[ ]'$ ; TIA '[ ]'$ ; Slurred speech '[ ]'$ ;  Neuro: Headaches'[ ]'$ ; Vertigo'[ ]'$ ; Seizures[  ]; Paresthesias'[ ]'$ ;Blurred vision '[ ]'$ ; Diplopia '[ ]'$ ; Vision changes '[ ]'$   Ortho/Skin: Arthritis '[ ]'$ ; Joint pain [Y ]; Muscle pain '[ ]'$ ; Joint swelling '[ ]'$ ; Back Pain '[ ]'$ ; Rash '[ ]'$   Psych: Depression'[ ]'$ ; Anxiety'[ ]'$   Heme: Bleeding problems '[ ]'$ ; Clotting disorders '[ ]'$ ; Anemia '[ ]'$   Endocrine: Diabetes [Y ]; Thyroid dysfunction'[ ]'$   Home Medications Prior to Admission medications   Medication Sig Start Date End Date Taking? Authorizing Provider  acetaminophen (TYLENOL) 500 MG tablet Take 500 mg by mouth every 6 (six) hours as needed (pain).   Yes Historical Provider, MD  albuterol (PROVENTIL HFA;VENTOLIN HFA) 108 (90 Base) MCG/ACT inhaler Inhale 2 puffs into the lungs every 4 (four) hours as needed for wheezing or shortness of breath. 06/10/15  Yes Shawn C Joy, PA-C  albuterol (PROVENTIL) (2.5 MG/3ML) 0.083% nebulizer solution Take 2.5 mg by nebulization every 6 (six) hours as needed for wheezing or shortness of breath.   Yes Historical Provider, MD  allopurinol (ZYLOPRIM) 300 MG tablet Take 300 mg by mouth daily.     Yes Historical Provider, MD  apixaban (ELIQUIS) 5 MG TABS tablet Take 2 tablets (10 mg total) by mouth 2 (two) times daily. For 3 more days. On Monday 01/30/16 take 5 mg twice daily Patient taking differently: Take 5 mg by mouth 2 (two) times daily.  01/26/16  Yes [place]  Karlyne Greenspan, MD  CALCIUM-VITAMIN D PO Take 1 tablet by mouth 2 (two) times daily.    Yes Historical Provider, MD  ferrous sulfate 325 (65 FE) MG tablet Take 1 tablet (325 mg total) by mouth 2 (two) times daily with a meal. 01/26/16  Yes [place] Karlyne Greenspan, MD  furosemide (LASIX) 20 MG tablet Take 20 mg by mouth daily.   Yes Historical Provider, MD  guaifenesin (ROBITUSSIN) 100 MG/5ML syrup Take 200 mg by mouth 3 (three) times daily as needed for cough or congestion.    Yes Historical Provider, MD  hydrocortisone cream 1 % Apply to affected area 2 times daily Patient taking differently: Apply 1 application topically 2 (two)  times daily as needed for itching.  10/05/14  Yes Hollace Kinnier Sofia, PA-C  isosorbide mononitrate (IMDUR) 30 MG 24 hr tablet Take 30 mg by mouth daily. 01/11/16  Yes Historical Provider, MD  metoprolol succinate (TOPROL-XL) 25 MG 24 hr tablet Take 25 mg by mouth daily. 07/27/13  Yes Historical Provider, MD  nitroGLYCERIN (NITROSTAT) 0.4 MG SL tablet Place 0.4 mg under the tongue every 5 (five) minutes as needed for chest pain.   Yes Historical Provider, MD  Omega-3 Fatty Acids (FISH OIL) 1000 MG CAPS Take 1,000 mg by mouth at bedtime.    Yes Historical Provider, MD  omeprazole (PRILOSEC) 40 MG capsule Take 1 capsule (40 mg total) by mouth 2 (two) times daily. 01/20/16  Yes Orson Eva, MD  oxyCODONE (OXY IR/ROXICODONE) 5 MG immediate release tablet Take 1 tablet (5 mg total) by mouth every 4 (four) hours as needed for moderate pain. 01/26/16  Yes [place] Karlyne Greenspan, MD  OXYGEN Inhale 2.5 L into the lungs continuous.   Yes Historical Provider, MD  spironolactone (ALDACTONE) 25 MG tablet Take 0.5 tablets (12.5 mg total) by mouth daily. 01/21/16  Yes Orson Eva, MD  vitamin B-12 (CYANOCOBALAMIN) 1000 MCG tablet Take 1,000 mcg by mouth 2 (two) times daily.   Yes Historical Provider, MD  azithromycin (ZITHROMAX) 250 MG tablet Take 1 tablet (250 mg total) by mouth daily. Patient not taking: Reported on 04/02/2016 03/29/16 04/02/16  Gareth Morgan, MD  benzonatate (TESSALON) 100 MG capsule Take 1 capsule (100 mg total) by mouth every 8 (eight) hours. 06/10/15   Shawn C Joy, PA-C  doxycycline (VIBRAMYCIN) 100 MG capsule Take 1 capsule (100 mg total) by mouth 2 (two) times daily. Patient not taking: Reported on 04/02/2016 03/22/16   [name], MD  predniSONE (DELTASONE) 10 MG tablet Take 4 tablets (40 mg total) by mouth daily. Patient not taking: Reported on 04/02/2016 03/29/16 04/02/16  Gareth Morgan, MD    Past Medical History: Past Medical History:  Diagnosis Date  . Arthritis   . Black lung disease ([place])   .  CAD (coronary artery disease)    a. 07/2002 CABG x 3: LIMA->LAD, VG->Diag, VG->OM;  b. 06/2006 Cath: LM 50-60ost/p, LAD patent mid stent, D1 sev dzs, D2 patent stent, LCX nl, OM2 sev sten prox, RCA large/nl, VG->Diag nl, VG->OM nl, LIMA->LAD atretic, EF 30%.  . Carotid artery occlusion   . Chronic atrial fibrillation ([place])   . Chronic respiratory failure ([place])   . Chronic systolic CHF (congestive heart failure) ([place])    a. 12/2012 Echo: EF 20-25%, mid-dist antsept AK, mod dil LA.  [name] COPD (chronic obstructive pulmonary disease) ([place])    a. On home O2.  [name] CVA (cerebral vascular accident) ([place])   . Diabetes mellitus   . GERD (gastroesophageal  reflux disease)   . Gout   . Hypercholesterolemia   . Hypertension   . Ischemic cardiomyopathy    a. 06/2006  BIV-ICD -> gen change 2015 to [place].  . Lung cancer ([place])   . Nephrolithiasis 09/2000  . On home oxygen therapy    "2.5L; 24/7" (01/16/2016)  . Prostate cancer ([place]) 11/01/10   gleason 7, 8, 9, gold seeds 02/08/11  . Symptomatic bradycardia     Past Surgical History: Past Surgical History:  Procedure Laterality Date  . BI-VENTRICULAR PACEMAKER INSERTION (CRT-P)  12-02-13   downgrade of previously implanted CRTD to STJ CRTP  . BIV PACEMAKER GENERATOR CHANGE OUT N/A 12/02/2013   Procedure: BIV PACEMAKER GENERATOR CHANGE OUT;  Surgeon: Evans Lance, MD;  Location: [facility] CATH LAB;  Service: Cardiovascular;  Laterality: N/A;  . COLONOSCOPY    . CORONARY ANGIOPLASTY WITH STENT PLACEMENT  07/1997; 08/1997;03/1998  . CORONARY ARTERY BYPASS GRAFT  07/2002   CABG X3  . ESOPHAGOGASTRODUODENOSCOPY  02/19/2011   Procedure: ESOPHAGOGASTRODUODENOSCOPY (EGD);  Surgeon: Jeryl Columbia, MD;  Location: [facility] ENDOSCOPY;  Service: Endoscopy;  Laterality: N/A;  . FEMORAL-POPLITEAL BYPASS GRAFT Right 01/20/2016   Procedure: THROMBECTOMY OF POPLITEAL ARTERY AND ANTERIOR TIBIAL ARTERY RIGHT LEG; INTRAOPERATIVE ARTERIOGRAM;  Surgeon: Waynetta Sandy, MD;   Location: [facility];  Service: Vascular;  Laterality: Right;  . INCISION AND DRAINAGE OF WOUND  08/2002   right thigh; S/P EVH  . INSERT / REPLACE / [place]; 1992; 01/2000;  . INSERT / REPLACE / REMOVE PACEMAKER  09/2003; 06/2006   w/AICD  . INSERT / REPLACE / REMOVE PACEMAKER  12/2004   pacmaker explant  . SHOULDER ARTHROSCOPY W/ ROTATOR CUFF REPAIR  05/2008   left  . upper endoscpopy      Family History: Family History  Problem Relation Age of Onset  . Alzheimer's disease Father 100  . Cancer Father 72    metastatic prostate cancer  . Diabetes Sister 39  . Diabetes Brother   . Diabetes Brother   . Diabetes Brother   . Hypotension Neg Hx   . Malignant hyperthermia Neg Hx   . Pseudochol deficiency Neg Hx     Social History: Social History   Social History  . Marital status: Widowed    Spouse name: N/A  . Number of children: N/A  . Years of education: N/A   Occupational History  . retired Retired    Equities trader   Social History Main Topics  . Smoking status: Former Smoker    Types: Cigarettes    Quit date: 03/12/1949  . Smokeless tobacco: Current User    Types: Chew  . Alcohol use No     Comment: drank until per pt 03/19/11, h/o heavy use  . Drug use: No  . Sexual activity: Yes   Other Topics Concern  . None   Social History Narrative  . None    Allergies:  Allergies  Allergen Reactions  . [place] [Digoxin] Other (See Comments)    Caused patient to have chest pains and dizzy spells  . Aspirin Hives  . Penicillins Rash    Rash Has patient had a PCN reaction causing immediate rash, facial/tongue/throat swelling, SOB or lightheadedness with hypotension:YES Has patient had a PCN reaction causing severe rash involving mucus membranes or skin necrosis: NO Has patient had a PCN reaction that required hospitalization NO Has patient had a PCN reaction occurring within the last 10 years:NO If all of the above  answers are "NO", then may proceed with  Cephalosporin use.    Objective:    Vital Signs:   Temp:  [97.7 F (36.5 C)] 97.7 F (36.5 C) (01/22 0923) Pulse Rate:  [63-68] 63 (01/22 1245) Resp:  [16-27] 22 (01/22 1245) BP: (122-160)/(69-138) 122/77 (01/22 1245) SpO2:  [97 %-100 %] 100 % (01/22 1245) Weight:  [124 lb (56.2 kg)] 124 lb (56.2 kg) (01/22 0923)    Weight change: Filed Weights   04/02/16 0923  Weight: 124 lb (56.2 kg)    Intake/Output:   Intake/Output Summary (Last 24 hours) at 04/02/16 1455 Last data filed at 04/02/16 1314  Gross per 24 hour  Intake                0 ml  Output              250 ml  Net             -250 ml     Physical Exam: General:  Elderly . Dyspneic talking.   HEENT: normal Neck: supple. JVP to jaw. Carotids 2+ bilat; no bruits. No lymphadenopathy or thryomegaly appreciated. Cor: PMI nondisplaced. Regular rate & rhythm. No rubs, gallops or murmurs. Lungs: Crackles on 4 liters [place] Abdomen: soft, nontender, nondistended. No hepatosplenomegaly. No bruits or masses. Good bowel sounds. Extremities: no cyanosis, clubbing, rash, R and LLE 2+ edema Neuro: alert & orientedx3, cranial nerves grossly intact. moves all 4 extremities w/o difficulty. Affect pleasant  Telemetry: Sinus Rhythm 60s   Labs: Basic Metabolic Panel:  Recent Labs Lab 03/29/16 1414 04/02/16 0925  NA 135 135  K 4.8 4.6  CL 97* 97*  CO2 27 20*  GLUCOSE 117* 111*  BUN 29* 34*  CREATININE 1.70* 1.77*  CALCIUM 9.1 9.4    Liver Function Tests: No results for input(s): AST, ALT, ALKPHOS, BILITOT, PROT, ALBUMIN in the last 168 hours. No results for input(s): LIPASE, AMYLASE in the last 168 hours. No results for input(s): AMMONIA in the last 168 hours.  CBC:  Recent Labs Lab 03/29/16 1414 04/02/16 0925  WBC 2.8* 7.9  HGB 9.1* 9.4*  HCT 29.1* 30.3*  MCV 101.0* 100.7*  PLT 93* 130*    Cardiac Enzymes: No results for input(s): CKTOTAL, CKMB, CKMBINDEX, TROPONINI in the last 168 hours.  BNP: BNP (last  3 results)  Recent Labs  03/21/16 1816 03/29/16 1951 04/02/16 0925  BNP 2,909.3* 2,710.1* 3,007.2*    ProBNP (last 3 results) No results for input(s): PROBNP in the last 8760 hours.   CBG: No results for input(s): GLUCAP in the last 168 hours.  Coagulation Studies: No results for input(s): LABPROT, INR in the last 72 hours.  Other results: TFT:DDUKGUR: Dg Chest 2 View  Result Date: 04/02/2016 CLINICAL DATA:  Cough, shortness of breath, history of CHF, COPD, and black lung disease. Previous CABG. ICD. EXAM: CHEST  2 VIEW FINDINGS: The lungs are adequately inflated. The interstitial infiltrates observed previously are now more alveolar in nature. The cardiac silhouette remains enlarged. The pulmonary vascularity is more engorged today. There is blunting of the left costophrenic angles. The ICD is in stable position. There is calcification in the wall of the aortic arch. The sternal wires are intact. A unused right ventricular electrode is again demonstrated. There are degenerative changes of both shoulders. There is moderate multilevel degenerative disc disease of the thoracic spine. IMPRESSION: CHF with interstitial and alveolar opacities superimposed upon chronic interstitial changes. Electronically Signed   By: David  Martinique  M.D.   On: 04/02/2016 09:58      Medications:     Current Medications: . allopurinol  300 mg Oral Daily  . apixaban  5 mg Oral BID  . benzonatate  100 mg Oral TID  . furosemide  60 mg Intravenous BID  . ipratropium-albuterol  3 mL Nebulization Q4H  . isosorbide mononitrate  30 mg Oral Daily  . metoprolol succinate  25 mg Oral Daily  . pantoprazole  80 mg Oral Daily  . [START ON 04/03/2016] predniSONE  60 mg Oral Q breakfast  . sodium chloride flush  3 mL Intravenous Q12H  . spironolactone  12.5 mg Oral Daily     Infusions: . sodium chloride    . levofloxacin (LEVAQUIN) IV        Assessment/Plan/Discussion  Mr Budney is an 83 year old admitted  with A/C Respiratory Failure in the setting of COPD exacerbation and marked volume overload. This is his 3rd ED evaluation in a month for increased dyspnea.    1. A/C Respiratory Failure- Dyspnea. Multi-factorial given volume overload and COPD exacerbation.  Started levaquin and duonebs. Sputum CX pending.  2.A/C Systolic Heart Failure- ICM . ECHO 01/28/2016 EF 10-15% . ST Jude CRT-D - Have asked ST jude to interrogate device.  NYHA IV. Marked volume overload. Appears Warm and wet. Diurese with IV lasix. Continue low dose bb but may need to stop. No Arni for now with CKD. Continue low dose spiro. Add low dose hydralazine/imdur after diuresed. Watch renal function closely.  3. HTN Urgency- BP improved with diuresis.  4. AKI/CKD Stage - Creatinine up from 3 weeks ago. Creatinine 1.3>1.77. Looks like creatinine is usually less than 1.4  5. CAD- S/P CABG 2004-  6. Chronic A fib - controlled rate. Continue low dose bb. Currently on eliquis 5 mg twice a day but due to age > 33, weight < 60 kg,  and creatinine >1.5 will need to cut back to 2.5 mg twice a day.  7. H/O Black Lung Disease 8. H/O Prostate Cancer- 2012 seed implant   Length of Stay: 0  Amy Clegg NP-C  04/02/2016, 2:55 PM  Advanced Heart Failure Team Pager [phone] (M-F; 7a - 4p)  Please contact [facility] for night-coverage after hours (4p -7a ) and weekends on [email or web address]  Patient seen and examined with Darrick Grinder, NP. We discussed all aspects of the encounter. I agree with the assessment and plan as stated above.   He is markedly volume overloaded despite reported compliance with his home regimen. Agree with admission for IV diuresis and adjustment of HF regimen. With recurrent ER visits will likely need paramedicine to follow. Watch renal function and electrolytes closely. AF is chronic and rate controlled. Agree with dose reducing Eliquis although he states he thinks it doesn't agree with him.   Bensimhon, Daniel,MD 5:08  PM

## 2016-04-02 NOTE — Progress Notes (Signed)
MRSA PCR Collected and sent to lab. Patient's daughter is expected, will complete any remaining admission requirements at that time.

## 2016-04-02 NOTE — ED Triage Notes (Signed)
Pt here for increased SOB and shaking per family; pt seen here recently for similar; pt sts cough; pt on home O2

## 2016-04-02 NOTE — Care Management Note (Signed)
Case Management Note  Patient Details  Name: [name] MRN: [id number] Date of Birth: 05-Sep-1932  Subjective/Objective:                  From home with daughter. /83 year old male with a history of black lung disease, coronary artery disease, chronic atrial fibrillation, chronic systolic congestive heart failure, COPD on 2.5 L of oxygen, diabetes history of atrial thrombus in November 2017 and acute lower leg ischemia requiring thromboembolectomy now on eliquis who presents with concern for dyspnea, cough, wheezing.  Action/Plan: Admit status INPATIENT (Acute on chronic respiratory failure: likely multifactorial from CHF exacerbation and COPD exacerbation.); anticipate discharge [facility].   Expected Discharge Date:   (unsure)               Expected Discharge Plan:  [place]  In-House Referral:     Discharge planning Services  CM Consult  Post Acute Care Choice:    Choice offered to:     DME Arranged:    DME Agency:     HH Arranged:    HH Agency:     Status of Service:  In process, will continue to follow  If discussed at Long Length of Stay Meetings, dates discussed:    Additional Comments: Pt was offered home health services on 11/17 admission but refused to have agency visit after discharge home.  Pt is active with [facility] EMMI services.  [facility] notified of pt admission. Fuller Mandril, RN 04/02/2016, 2:19 PM

## 2016-04-02 NOTE — ED Notes (Signed)
Pt states daughter took his belongings home with her.

## 2016-04-02 NOTE — Progress Notes (Signed)
Pharmacy Antibiotic Note  [name] is a 83 y.o. male admitted on 04/02/2016 with COPD exacerbation .  Pharmacy has been consulted for levaquin dosing.  Wt 56.2 kg, WBC 7.9, creat 1.77, AF.    Plan: Levaquin 750 mg IV q48hrs F/u renal fxn and ability to change to PO abx  Height: 5' (152.4 cm) Weight: 124 lb (56.2 kg) IBW/kg (Calculated) : 50  Temp (24hrs), Avg:97.7 F (36.5 C), Min:97.7 F (36.5 C), Max:97.7 F (36.5 C)   Recent Labs Lab 03/29/16 1414 04/02/16 0925  WBC 2.8* 7.9  CREATININE 1.70* 1.77*    Estimated Creatinine Clearance: 23.1 mL/min (by C-G formula based on SCr of 1.77 mg/dL (H)).    Allergies  Allergen Reactions  . [place] [Digoxin] Other (See Comments)    Caused patient to have chest pains and dizzy spells  . Aspirin Hives  . Penicillins Rash    Rash Has patient had a PCN reaction causing immediate rash, facial/tongue/throat swelling, SOB or lightheadedness with hypotension:YES Has patient had a PCN reaction causing severe rash involving mucus membranes or skin necrosis: NO Has patient had a PCN reaction that required hospitalization NO Has patient had a PCN reaction occurring within the last 10 years:NO If all of the above answers are "NO", then may proceed with Cephalosporin use.   Eudelia Bunch, Pharm.D. [phone] 04/02/2016 1:29 PM

## 2016-04-02 NOTE — H&P (Signed)
History and Physical    [name] FGH:[id number] DOB: 05/19/1932 DOA: 04/02/2016  PCP: [name], MD Patient coming from: home  Chief Complaint: sob, leg swelling  HPI: [name] is a 83 y.o. male with medical history significant of CAD, chronic respiratory failure on O2 secondary to advanced COPD, diabetes, CVA, GERD, HLD, HTN, prostate cancer, lung cancer, nephrolithiasis, symptomatic bradycardia status post pacemaker placement.  Patient complaining of worsening shortness of breath. Worse than baseline. Patient on 2-9 L nasal cannula at baseline at home. Patient seen in the ED for similar complaint on 03/21/2016 and 03/29/2016. During those visits patient was diagnosed with COPD exacerbation and CHF exacerbation. Symptoms were felt to be mild and okay for outpatient therapies including an increased dose of Lasix. Patient was also placed on prednisone and azithromycin at his last ED visit on the 18th. Patient is complaining of 3 days history of significant worsening of shortness of breath. Associated with orthopnea, productive cough, chest discomfort with coughing, increasing lower extremity swelling. Denies fevers, palpitations, LOC, neck stiffness, abdominal pain, dysuria, frequency, diarrhea, back pain.   ED Course: Patient noted to be hypoxic with increased respiratory effort. Placed on increased subpleural oxygen with improvement in condition. Objective findings outlined below.  Review of Systems: As per HPI otherwise 10 point review of systems negative.   Ambulatory Status: Limited secondary to respiratory symptoms.  Past Medical History:  Diagnosis Date  . Arthritis   . Black lung disease ([place])   . CAD (coronary artery disease)    a. 07/2002 CABG x 3: LIMA->LAD, VG->Diag, VG->OM;  b. 06/2006 Cath: LM 50-60ost/p, LAD patent mid stent, D1 sev dzs, D2 patent stent, LCX nl, OM2 sev sten prox, RCA large/nl, VG->Diag nl, VG->OM nl, LIMA->LAD atretic, EF 30%.  . Carotid artery  occlusion   . Chronic atrial fibrillation ([place])   . Chronic respiratory failure ([place])   . Chronic systolic CHF (congestive heart failure) ([place])    a. 12/2012 Echo: EF 20-25%, mid-dist antsept AK, mod dil LA.  [name] COPD (chronic obstructive pulmonary disease) ([place])    a. On home O2.  [name] CVA (cerebral vascular accident) ([place])   . Diabetes mellitus   . GERD (gastroesophageal reflux disease)   . Gout   . Hypercholesterolemia   . Hypertension   . Ischemic cardiomyopathy    a. 06/2006  BIV-ICD -> gen change 2015 to [place].  . Lung cancer ([place])   . Nephrolithiasis 09/2000  . On home oxygen therapy    "2.5L; 24/7" (01/16/2016)  . Prostate cancer ([place]) 11/01/10   gleason 7, 8, 9, gold seeds 02/08/11  . Symptomatic bradycardia     Past Surgical History:  Procedure Laterality Date  . BI-VENTRICULAR PACEMAKER INSERTION (CRT-P)  12-02-13   downgrade of previously implanted CRTD to STJ CRTP  . BIV PACEMAKER GENERATOR CHANGE OUT N/A 12/02/2013   Procedure: BIV PACEMAKER GENERATOR CHANGE OUT;  Surgeon: Evans Lance, MD;  Location: [facility] CATH LAB;  Service: Cardiovascular;  Laterality: N/A;  . COLONOSCOPY    . CORONARY ANGIOPLASTY WITH STENT PLACEMENT  07/1997; 08/1997;03/1998  . CORONARY ARTERY BYPASS GRAFT  07/2002   CABG X3  . ESOPHAGOGASTRODUODENOSCOPY  02/19/2011   Procedure: ESOPHAGOGASTRODUODENOSCOPY (EGD);  Surgeon: Jeryl Columbia, MD;  Location[facility] ENDOSCOPY;  Service: Endoscopy;  Laterality: N/A;  . FEMORAL-POPLITEAL BYPASS GRAFT Right 01/20/2016   Procedure: THROMBECTOMY OF POPLITEAL ARTERY AND ANTERIOR TIBIAL ARTERY RIGHT LEG; INTRAOPERATIVE ARTERIOGRAM;  Surgeon: Waynetta Sandy, MD;  Location:  [facility] OR;  Service: Vascular;  Laterality: Right;  . INCISION AND DRAINAGE OF WOUND  08/2002   right thigh; S/P EVH  . INSERT / REPLACE / [place]; 1992; 01/2000;  . INSERT / REPLACE / REMOVE PACEMAKER  09/2003; 06/2006   w/AICD  . INSERT / REPLACE / REMOVE PACEMAKER  12/2004    pacmaker explant  . SHOULDER ARTHROSCOPY W/ ROTATOR CUFF REPAIR  05/2008   left  . upper endoscpopy      Social History   Social History  . Marital status: Widowed    Spouse name: N/A  . Number of children: N/A  . Years of education: N/A   Occupational History  . retired Retired    Equities trader   Social History Main Topics  . Smoking status: Former Smoker    Types: Cigarettes    Quit date: 03/12/1949  . Smokeless tobacco: Current User    Types: Chew  . Alcohol use No     Comment: drank until per pt 03/19/11, h/o heavy use  . Drug use: No  . Sexual activity: Yes   Other Topics Concern  . Not on file   Social History Narrative  . No narrative on file    Allergies  Allergen Reactions  . [place] [Digoxin] Other (See Comments)    Caused patient to have chest pains and dizzy spells  . Aspirin Hives  . Penicillins Rash    Rash Has patient had a PCN reaction causing immediate rash, facial/tongue/throat swelling, SOB or lightheadedness with hypotension:YES Has patient had a PCN reaction causing severe rash involving mucus membranes or skin necrosis: NO Has patient had a PCN reaction that required hospitalization NO Has patient had a PCN reaction occurring within the last 10 years:NO If all of the above answers are "NO", then may proceed with Cephalosporin use.    Family History  Problem Relation Age of Onset  . Alzheimer's disease Father 27  . Cancer Father 58    metastatic prostate cancer  . Diabetes Sister 44  . Diabetes Brother   . Diabetes Brother   . Diabetes Brother   . Hypotension Neg Hx   . Malignant hyperthermia Neg Hx   . Pseudochol deficiency Neg Hx     Prior to Admission medications   Medication Sig Start Date End Date Taking? Authorizing Provider  acetaminophen (TYLENOL) 500 MG tablet Take 500 mg by mouth every 6 (six) hours as needed (pain).    Historical Provider, MD  albuterol (PROVENTIL HFA;VENTOLIN HFA) 108 (90 Base) MCG/ACT inhaler Inhale 2 puffs  into the lungs every 4 (four) hours as needed for wheezing or shortness of breath. 06/10/15   Shawn C Joy, PA-C  albuterol (PROVENTIL) (2.5 MG/3ML) 0.083% nebulizer solution Take 2.5 mg by nebulization every 6 (six) hours as needed for wheezing or shortness of breath.    Historical Provider, MD  allopurinol (ZYLOPRIM) 300 MG tablet Take 300 mg by mouth daily.      Historical Provider, MD  apixaban (ELIQUIS) 5 MG TABS tablet Take 2 tablets (10 mg total) by mouth 2 (two) times daily. For 3 more days. On Monday 01/30/16 take 5 mg twice daily 01/26/16   Caren Griffins, MD  azithromycin (ZITHROMAX) 250 MG tablet Take 1 tablet (250 mg total) by mouth daily. 03/29/16 04/02/16  Gareth Morgan, MD  benzonatate (TESSALON) 100 MG capsule Take 1 capsule (100 mg total) by mouth every 8 (eight) hours. 06/10/15   Lorayne Bender, PA-C  CALCIUM-VITAMIN  D PO Take 1 tablet by mouth 2 (two) times daily.     Historical Provider, MD  doxycycline (VIBRAMYCIN) 100 MG capsule Take 1 capsule (100 mg total) by mouth 2 (two) times daily. 03/22/16   [name], MD  ferrous sulfate 325 (65 FE) MG tablet Take 1 tablet (325 mg total) by mouth 2 (two) times daily with a meal. 01/26/16   Costin Karlyne Greenspan, MD  furosemide (LASIX) 20 MG tablet Take 20 mg by mouth daily.    Historical Provider, MD  guaifenesin (ROBITUSSIN) 100 MG/5ML syrup Take 200 mg by mouth 3 (three) times daily as needed for cough.    Historical Provider, MD  hydrocortisone cream 1 % Apply to affected area 2 times daily Patient taking differently: Apply 1 application topically 2 (two) times daily as needed for itching. Apply to affected area 2 times daily 10/05/14   Fransico Meadow, PA-C  isosorbide mononitrate (IMDUR) 30 MG 24 hr tablet Take 30 mg by mouth daily. 01/11/16   Historical Provider, MD  metoprolol succinate (TOPROL-XL) 25 MG 24 hr tablet Take 25 mg by mouth daily. 07/27/13   Historical Provider, MD  nitroGLYCERIN (NITROSTAT) 0.4 MG SL tablet Place 0.4 mg under  the tongue every 5 (five) minutes as needed for chest pain.    Historical Provider, MD  Omega-3 Fatty Acids (FISH OIL) 1000 MG CAPS Take 1,000 mg by mouth at bedtime.     Historical Provider, MD  omeprazole (PRILOSEC) 40 MG capsule Take 1 capsule (40 mg total) by mouth 2 (two) times daily. 01/20/16   Orson Eva, MD  oxyCODONE (OXY IR/ROXICODONE) 5 MG immediate release tablet Take 1 tablet (5 mg total) by mouth every 4 (four) hours as needed for moderate pain. 01/26/16   [place]ston, MD  OXYGEN Inhale 2.5 L into the lungs continuous.    Historical Provider, MD  predniSONE (DELTASONE) 10 MG tablet Take 4 tablets (40 mg total) by mouth daily. 03/29/16 04/02/16  Gareth Morgan, MD  spironolactone (ALDACTONE) 25 MG tablet Take 0.5 tablets (12.5 mg total) by mouth daily. 01/21/16   Orson Eva, MD  vitamin B-12 (CYANOCOBALAMIN) 1000 MCG tablet Take 1,000 mcg by mouth 2 (two) times daily.    Historical Provider, MD    Physical Exam: Vitals:   04/02/16 1200 04/02/16 1215 04/02/16 1230 04/02/16 1245  BP: 123/69 126/74 (!) 131/102 122/77  Pulse: 63 63 63 63  Resp: (!) '27 17 16 22  '$ Temp:      TempSrc:      SpO2: 100% 100% 97% 100%  Weight:      Height:         General: Patient in mild distress resting in bed  Eyes:  PERRL, EOMI, normal lids, iris ENT:  grossly normal hearing, lips & tongue, mmm Neck:  no LAD, masses or thyromegaly Cardiovascular: RRR, 3/6 systolic murmur, regular rate and rhythm, 3+ bilateral lower extremity pitting edema  Respiratory: Increased effort, on nasal cannula, diffuse wheezing with decreased air movement Abdomen:  soft, ntnd, Skin:  no rash or induration seen on limited exam Musculoskeletal:  grossly normal tone BUE/BLE, good ROM, no bony abnormality Psychiatric:  grossly normal mood and affect, speech fluent and appropriate, AOx3 Neurologic:  CN 2-12 grossly intact, moves all extremities in coordinated fashion, sensation intact  Labs on Admission: I have  personally reviewed following labs and imaging studies  CBC:  Recent Labs Lab 03/29/16 1414 04/02/16 0925  WBC 2.8* 7.9  HGB 9.1* 9.4*  HCT 29.1* 30.3*  MCV 101.0* 100.7*  PLT 93* 448*   Basic Metabolic Panel:  Recent Labs Lab 03/29/16 1414 04/02/16 0925  NA 135 135  K 4.8 4.6  CL 97* 97*  CO2 27 20*  GLUCOSE 117* 111*  BUN 29* 34*  CREATININE 1.70* 1.77*  CALCIUM 9.1 9.4   GFR: Estimated Creatinine Clearance: 23.1 mL/min (by C-G formula based on SCr of 1.77 mg/dL (H)). Liver Function Tests: No results for input(s): AST, ALT, ALKPHOS, BILITOT, PROT, ALBUMIN in the last 168 hours. No results for input(s): LIPASE, AMYLASE in the last 168 hours. No results for input(s): AMMONIA in the last 168 hours. Coagulation Profile: No results for input(s): INR, PROTIME in the last 168 hours. Cardiac Enzymes: No results for input(s): CKTOTAL, CKMB, CKMBINDEX, TROPONINI in the last 168 hours. BNP (last 3 results) No results for input(s): PROBNP in the last 8760 hours. HbA1C: No results for input(s): HGBA1C in the last 72 hours. CBG: No results for input(s): GLUCAP in the last 168 hours. Lipid Profile: No results for input(s): CHOL, HDL, LDLCALC, TRIG, CHOLHDL, LDLDIRECT in the last 72 hours. Thyroid Function Tests: No results for input(s): TSH, T4TOTAL, FREET4, T3FREE, THYROIDAB in the last 72 hours. Anemia Panel: No results for input(s): VITAMINB12, FOLATE, FERRITIN, TIBC, IRON, RETICCTPCT in the last 72 hours. Urine analysis:    Component Value Date/Time   COLORURINE YELLOW 01/21/2016 0114   APPEARANCEUR CLEAR 01/21/2016 0114   LABSPEC 1.012 01/21/2016 0114   LABSPEC 1.015 05/03/2011 0950   PHURINE 6.0 01/21/2016 0114   GLUCOSEU NEGATIVE 01/21/2016 0114   HGBUR SMALL (A) 01/21/2016 0114   BILIRUBINUR NEGATIVE 01/21/2016 0114   BILIRUBINUR Color Interference 05/03/2011 0950   KETONESUR NEGATIVE 01/21/2016 0114   PROTEINUR NEGATIVE 01/21/2016 0114   UROBILINOGEN 0.2  11/10/2014 1815   NITRITE NEGATIVE 01/21/2016 0114   LEUKOCYTESUR NEGATIVE 01/21/2016 0114   LEUKOCYTESUR Color Interference 05/03/2011 0950    Creatinine Clearance: Estimated Creatinine Clearance: 23.1 mL/min (by C-G formula based on SCr of 1.77 mg/dL (H)).  Sepsis Labs: '@LABRCNTIP'$ (procalcitonin:4,lacticidven:4) )No results found for this or any previous visit (from the past 240 hour(s)).   Radiological Exams on Admission: Dg Chest 2 View  Result Date: 04/02/2016 CLINICAL DATA:  Cough, shortness of breath, history of CHF, COPD, and black lung disease. Previous CABG. ICD. EXAM: CHEST  2 VIEW FINDINGS: The lungs are adequately inflated. The interstitial infiltrates observed previously are now more alveolar in nature. The cardiac silhouette remains enlarged. The pulmonary vascularity is more engorged today. There is blunting of the left costophrenic angles. The ICD is in stable position. There is calcification in the wall of the aortic arch. The sternal wires are intact. A unused right ventricular electrode is again demonstrated. There are degenerative changes of both shoulders. There is moderate multilevel degenerative disc disease of the thoracic spine. IMPRESSION: CHF with interstitial and alveolar opacities superimposed upon chronic interstitial changes. Electronically Signed   By: Pamala Hayman  [place] M.D.   On: 04/02/2016 09:58    EKG: Independently reviewed. Paced rhythm   Assessment/Plan Active Problems:   Cardiac conduction disorder   Pacemaker   Black lung disease ([place])   Acute on chronic systolic CHF (congestive heart failure) (HCC)   Chronic renal disease, stage III   Acute on chronic respiratory failure (HCC)   COPD exacerbation (HCC)   Macrocytic anemia   Acute on chronic respiratory failure: likely multifactorial from CHF exacerbation and COPD exacerbation. Predominately CHF w/ given EF 10-15%, marked LE  edema, orthopnea, wt gain, and CXR findings as above w/ increasing BNP now  over 3000. - Lasix 60 IV BID - CHF orders - Daily wts, I/O. - continue metop - continue spironolactone - CHF team consulted for additional assistance  Acute on chronic COPD exacerbation. Treated w/ outpt azithro and '40mg'$  prednisone w/ little benefit. No fevers. H/o Black lung disease and w/ h/o lung cancer.  - Stop azithro and start IV levaquin - DUonebs - continue tessalon - sputum Cx, procalcitonin  Chronic Afib / bradycardia / MI/CABG: s/p pacemaker placement >12 yrs ago. Currently rate controlled.  - continue eliquis - continue metop  HTN: - continue imdur and metoprolol  CKD: Cr 1.77. At baseline - BMET in am  Anemia: Hgb 9.4. Above baseline. Macrocytic - Anemia panel - CBC in am  DVT prophylaxis: Eliquis  Code Status: full - ocnfirmed w/ pt at time of admission  Family Communication: none  Disposition Plan: pending improvement and diuresis  Consults called: CHF team  Admission status: inpt    Treysean Petruzzi J MD [facility]  If 7PM-7AM, please contact night-coverage [email or web address] Password [email or web address]  04/02/2016, 2:26 PM

## 2016-04-03 DIAGNOSIS — I5043 Acute on chronic combined systolic (congestive) and diastolic (congestive) heart failure: Secondary | ICD-10-CM

## 2016-04-03 LAB — BASIC METABOLIC PANEL WITH GFR
Anion gap: 14 (ref 5–15)
BUN: 35 mg/dL — ABNORMAL HIGH (ref 6–20)
CO2: 25 mmol/L (ref 22–32)
Calcium: 8.7 mg/dL — ABNORMAL LOW (ref 8.9–10.3)
Chloride: 96 mmol/L — ABNORMAL LOW (ref 101–111)
Creatinine, Ser: 1.7 mg/dL — ABNORMAL HIGH (ref 0.61–1.24)
GFR calc Af Amer: 42 mL/min — ABNORMAL LOW
GFR calc non Af Amer: 36 mL/min — ABNORMAL LOW
Glucose, Bld: 177 mg/dL — ABNORMAL HIGH (ref 65–99)
Potassium: 3.6 mmol/L (ref 3.5–5.1)
Sodium: 135 mmol/L (ref 135–145)

## 2016-04-03 LAB — CBC
HEMATOCRIT: 26.3 % — AB (ref 39.0–52.0)
Hemoglobin: 8.7 g/dL — ABNORMAL LOW (ref 13.0–17.0)
MCH: 32.3 pg (ref 26.0–34.0)
MCHC: 33.1 g/dL (ref 30.0–36.0)
MCV: 97.8 fL (ref 78.0–100.0)
Platelets: 93 10*3/uL — ABNORMAL LOW (ref 150–400)
RBC: 2.69 MIL/uL — ABNORMAL LOW (ref 4.22–5.81)
RDW: 17.9 % — AB (ref 11.5–15.5)
WBC: 3.5 10*3/uL — AB (ref 4.0–10.5)

## 2016-04-03 LAB — INFLUENZA PANEL BY PCR (TYPE A & B)
INFLAPCR: NEGATIVE
INFLBPCR: NEGATIVE

## 2016-04-03 MED ORDER — FUROSEMIDE 10 MG/ML IJ SOLN
80.0000 mg | Freq: Two times a day (BID) | INTRAMUSCULAR | Status: DC
  Administered 2016-04-03 – 2016-04-05 (×4): 80 mg via INTRAVENOUS
  Filled 2016-04-03 (×5): qty 8

## 2016-04-03 MED ORDER — POTASSIUM CHLORIDE CRYS ER 20 MEQ PO TBCR
40.0000 meq | EXTENDED_RELEASE_TABLET | Freq: Once | ORAL | Status: AC
  Administered 2016-04-03: 40 meq via ORAL
  Filled 2016-04-03: qty 2

## 2016-04-03 MED ORDER — METOLAZONE 2.5 MG PO TABS
2.5000 mg | ORAL_TABLET | Freq: Once | ORAL | Status: AC
  Administered 2016-04-03: 2.5 mg via ORAL
  Filled 2016-04-03: qty 1

## 2016-04-03 MED ORDER — GUAIFENESIN-DM 100-10 MG/5ML PO SYRP
5.0000 mL | ORAL_SOLUTION | ORAL | Status: DC | PRN
  Administered 2016-04-03: 5 mL via ORAL
  Filled 2016-04-03: qty 5

## 2016-04-03 MED ORDER — IPRATROPIUM-ALBUTEROL 0.5-2.5 (3) MG/3ML IN SOLN
3.0000 mL | Freq: Three times a day (TID) | RESPIRATORY_TRACT | Status: DC
  Administered 2016-04-03 – 2016-04-06 (×10): 3 mL via RESPIRATORY_TRACT
  Filled 2016-04-03 (×10): qty 3

## 2016-04-03 NOTE — Progress Notes (Signed)
PROGRESS NOTE    [name]  ERD:[id number] DOB: 1932-06-22 DOA: 04/02/2016 PCP: [name], MD   Brief Narrative: [name] is a 83 y.o. male with medical history significant of CAD, chronic respiratory failure on O2 secondary to advanced COPD, diabetes, CVA, GERD, HLD, HTN, prostate cancer, lung cancer, nephrolithiasis, symptomatic bradycardia status post pacemaker placement. Patient complaining of worsening shortness of breath. Patient is complaining of 3 days history of significant worsening of shortness of breath. Associated with orthopnea, productive cough, chest discomfort with coughing, increasing lower extremity swelling. He was seen twice this month in the ED for similar complaints , manage out patient with prednisone and azithromycin.    Assessment & Plan:   Active Problems:   Cardiac conduction disorder   Pacemaker   Black lung disease ([place])   Acute on chronic systolic CHF (congestive heart failure) (HCC)   Chronic renal disease, stage III   Acute on chronic respiratory failure (HCC)   COPD exacerbation (HCC)   Macrocytic anemia   Acute on chronic respiratory failure: likely multifactorial from CHF exacerbation and COPD exacerbation.  -Predominately CHF w/ given EF 10-15%, marked LE edema, orthopnea, wt gain, and CXR findings as above w/ increasing BNP now over 3000. -Continue with IV lasix 60 Mg IV BID, Spironolactone.  -Weight;124 ------127  -Urine out put: 1.6 L.  -HF team following.  -will check for influenza, due to cough and sinus congestion, and respiratory failure.   Acute on chronic COPD exacerbation.  Continue with Tessalon pearl, schedule nebulizer, Levaquin.  Continue with prednisone.   Chronic Afib / bradycardia / MI/CABG: s/p pacemaker placement >12 yrs ago.  - continue eliquis - continue metop  HTN: - continue imdur and metoprolol  CKD III: Cr 1.77. At baseline - monitor on IV lasix.   Pancytopenia, Anemia: -Will start with  Anemia  panel     DVT prophylaxis: Eliquis Code Status: Full code.  Family Communication: care discussed with patient  Disposition Plan: remain inpatient for IV antibiotics.    Consultants:   Cardiology    Procedures:  none   Antimicrobials:   Levaquin.    Subjective: He is feeling ok, better. Still complaints of dyspnea.   Objective: Vitals:   04/02/16 2119 04/03/16 0019 04/03/16 0043 04/03/16 0444  BP:  131/75  135/71  Pulse:  62  64  Resp:  20  20  Temp:  98 F (36.7 C)  98.2 F (36.8 C)  TempSrc:  Oral  Oral  SpO2: 99% 100% 97% 99%  Weight:    57.9 kg (127 lb 11.2 oz)  Height:        Intake/Output Summary (Last 24 hours) at 04/03/16 0735 Last data filed at 04/03/16 0512  Gross per 24 hour  Intake              270 ml  Output             1651 ml  Net            -1381 ml   Filed Weights   04/02/16 0923 04/03/16 0444  Weight: 56.2 kg (124 lb) 57.9 kg (127 lb 11.2 oz)    Examination:  General exam: Appears calm and comfortable  Respiratory system: Bilateral crackles. Respiratory effort normal. Cardiovascular system: S1 & S2 heard, RRR. No JVD, murmurs, rubs, gallops or clicks. Plus 2 edema.  Gastrointestinal system: Abdomen is nondistended, soft and nontender. No organomegaly or masses felt. Normal bowel sounds heard. Central nervous system: Alert  and oriented. No focal neurological deficits. Extremities: Symmetric 5 x 5 power. Skin: No rashes, lesions or ulcers Psychiatry: Judgement and insight appear normal. Mood & affect appropriate.     Data Reviewed: I have personally reviewed following labs and imaging studies  CBC:  Recent Labs Lab 03/29/16 1414 04/02/16 0925 04/03/16 0348  WBC 2.8* 7.9 3.5*  HGB 9.1* 9.4* 8.7*  HCT 29.1* 30.3* 26.3*  MCV 101.0* 100.7* 97.8  PLT 93* 130* 93*   Basic Metabolic Panel:  Recent Labs Lab 03/29/16 1414 04/02/16 0925 04/03/16 0348  NA 135 135 135  K 4.8 4.6 3.6  CL 97* 97* 96*  CO2 27 20* 25    GLUCOSE 117* 111* 177*  BUN 29* 34* 35*  CREATININE 1.70* 1.77* 1.70*  CALCIUM 9.1 9.4 8.7*   GFR: Estimated Creatinine Clearance: 24.1 mL/min (by C-G formula based on SCr of 1.7 mg/dL (H)). Liver Function Tests: No results for input(s): AST, ALT, ALKPHOS, BILITOT, PROT, ALBUMIN in the last 168 hours. No results for input(s): LIPASE, AMYLASE in the last 168 hours. No results for input(s): AMMONIA in the last 168 hours. Coagulation Profile: No results for input(s): INR, PROTIME in the last 168 hours. Cardiac Enzymes: No results for input(s): CKTOTAL, CKMB, CKMBINDEX, TROPONINI in the last 168 hours. BNP (last 3 results) No results for input(s): PROBNP in the last 8760 hours. HbA1C: No results for input(s): HGBA1C in the last 72 hours. CBG: No results for input(s): GLUCAP in the last 168 hours. Lipid Profile: No results for input(s): CHOL, HDL, LDLCALC, TRIG, CHOLHDL, LDLDIRECT in the last 72 hours. Thyroid Function Tests: No results for input(s): TSH, T4TOTAL, FREET4, T3FREE, THYROIDAB in the last 72 hours. Anemia Panel: No results for input(s): VITAMINB12, FOLATE, FERRITIN, TIBC, IRON, RETICCTPCT in the last 72 hours. Sepsis Labs:  Recent Labs Lab 04/02/16 0925  PROCALCITON <0.10    Recent Results (from the past 240 hour(s))  MRSA PCR Screening     Status: None   Collection Time: 04/02/16  5:05 PM  Result Value Ref Range Status   MRSA by PCR NEGATIVE NEGATIVE Final    Comment:        The GeneXpert MRSA Assay (FDA approved for NASAL specimens only), is one component of a comprehensive MRSA colonization surveillance program. It is not intended to diagnose MRSA infection nor to guide or monitor treatment for MRSA infections.          Radiology Studies: Dg Chest 2 View  Result Date: 04/02/2016 CLINICAL DATA:  Cough, shortness of breath, history of CHF, COPD, and black lung disease. Previous CABG. ICD. EXAM: CHEST  2 VIEW FINDINGS: The lungs are adequately  inflated. The interstitial infiltrates observed previously are now more alveolar in nature. The cardiac silhouette remains enlarged. The pulmonary vascularity is more engorged today. There is blunting of the left costophrenic angles. The ICD is in stable position. There is calcification in the wall of the aortic arch. The sternal wires are intact. A unused right ventricular electrode is again demonstrated. There are degenerative changes of both shoulders. There is moderate multilevel degenerative disc disease of the thoracic spine. IMPRESSION: CHF with interstitial and alveolar opacities superimposed upon chronic interstitial changes. Electronically Signed   By: David  [place] M.D.   On: 04/02/2016 09:58        Scheduled Meds: . allopurinol  300 mg Oral Daily  . apixaban  2.5 mg Oral BID  . benzonatate  100 mg Oral TID  . furosemide  60  mg Intravenous BID  . ipratropium-albuterol  3 mL Nebulization QID  . isosorbide mononitrate  30 mg Oral Daily  . levofloxacin (LEVAQUIN) IV  750 mg Intravenous Q48H  . metoprolol succinate  25 mg Oral Daily  . pantoprazole  80 mg Oral Daily  . predniSONE  60 mg Oral Q breakfast  . sodium chloride flush  3 mL Intravenous Q12H  . spironolactone  12.5 mg Oral Daily   Continuous Infusions:   LOS: 1 day    Time spent: 35 minutes.     Elmarie Shiley, MD [facility] Pager [phone]  If 7PM-7AM, please contact night-coverage [email or web address] Password [facility] 04/03/2016, 7:35 AM

## 2016-04-03 NOTE — Consult Note (Addendum)
   [facility] Inpatient Consult   04/03/2016  [name] 06-Oct-1932 [id number]   Patient was assessed for [place] Management for community services.Patient has [employer].  Patient was previously active with [place] Management with the [facility] Telephonic RN with EMMI calls but,  there was difficulty keeping in contact with the patient.  Chart review reveals the patient is [name] a 83 y.o.malewith medical history significant of CAD, chronic respiratory failure on O2 secondary to advanced COPD, diabetes, CVA, GERD, HLD, HTN, prostate cancer, lung cancer, nephrolithiasis, symptomatic bradycardia status post pacemaker placement per MD notes. Met with patient at bedside regarding being restarted with [facility] services. Consent form signed and folder with [place] Management information given.  Patient states his daughter lives with him and she answer a lot of his calls especially when he is short of breath.   Of note, [facility] Care Management services does not replace or interfere with any services that are arranged by inpatient case management or social work. For additional questions or referrals please contact:  Natividad Brood, RN BSN [facility] Liaison  [phone] business mobile phone Toll free office [phone]  Patient states he was on his last oxygen tank when he was admitted because of the snow he hasn't received 02 lately. Updated inpatient RNCM that [place] Management will follow patient for post hospital monitoring. Patient states he does not weigh daily but does have the tele-monitoring equipment sent from his [employer].

## 2016-04-03 NOTE — Progress Notes (Signed)
Advanced Heart Failure Rounding Note  Referring Physician: Dr Marily Memos Primary Physician: Dr Jeneen Rinks  Primary Cardiologist:  Dr Lovena Le   Reason for Consultation: A/C Systolic Heart Failure   Subjective:    Admitted 04/02/16 with marked volume overload. BNP 3000  Legs somewhat sore but swelling better. No SOB at rest, but still having DOE.    Out 1.3 L and weight shows up 3 lbs. Creatinine stable on lasix 60 mg BID.   Objective:   Weight Range: 127 lb 11.2 oz (57.9 kg) Body mass index is 24.94 kg/m.   Vital Signs:   Temp:  [98 F (36.7 C)-98.2 F (36.8 C)] 98.1 F (36.7 C) (01/23 0800) Pulse Rate:  [59-69] 69 (01/23 0953) Resp:  [16-34] 23 (01/23 0800) BP: (119-144)/(64-102) 129/77 (01/23 0953) SpO2:  [91 %-100 %] 98 % (01/23 0800) Weight:  [127 lb 11.2 oz (57.9 kg)] 127 lb 11.2 oz (57.9 kg) (01/23 0444) Last BM Date: 04/02/16  Weight change: Filed Weights   04/02/16 0923 04/03/16 0444  Weight: 124 lb (56.2 kg) 127 lb 11.2 oz (57.9 kg)    Intake/Output:   Intake/Output Summary (Last 24 hours) at 04/03/16 1120 Last data filed at 04/03/16 1033  Gross per 24 hour  Intake              730 ml  Output             1801 ml  Net            -1071 ml     Physical Exam: General: Elderly and chronically ill appearing.  HEENT: normal Neck: supple. JVP 9-10. Carotids 2+ bilat; no bruits. No thyromegaly or nodule noted.  Cor: PMI nondisplaced. RRR. No M/G/R  Lungs: Mild basilar crackles on excam. on 4 liters [place] Abdomen: soft, NT, ND, no HSM. No bruits or masses. +BS  Extremities: no cyanosis, clubbing, rash, R and LLE 1+ edema Neuro: alert & orientedx3, cranial nerves grossly intact. moves all 4 extremities w/o difficulty. Affect pleasant  Telemetry: Reviewed, NSR 60s  Labs: CBC  Recent Labs  04/02/16 0925 04/03/16 0348  WBC 7.9 3.5*  HGB 9.4* 8.7*  HCT 30.3* 26.3*  MCV 100.7* 97.8  PLT 130* 93*   Basic Metabolic Panel  Recent Labs  04/02/16 0925  04/03/16 0348  NA 135 135  K 4.6 3.6  CL 97* 96*  CO2 20* 25  GLUCOSE 111* 177*  BUN 34* 35*  CREATININE 1.77* 1.70*  CALCIUM 9.4 8.7*   Liver Function Tests No results for input(s): AST, ALT, ALKPHOS, BILITOT, PROT, ALBUMIN in the last 72 hours. No results for input(s): LIPASE, AMYLASE in the last 72 hours. Cardiac Enzymes No results for input(s): CKTOTAL, CKMB, CKMBINDEX, TROPONINI in the last 72 hours.  BNP: BNP (last 3 results)  Recent Labs  03/21/16 1816 03/29/16 1951 04/02/16 0925  BNP 2,909.3* 2,710.1* 3,007.2*    ProBNP (last 3 results) No results for input(s): PROBNP in the last 8760 hours.   D-Dimer No results for input(s): DDIMER in the last 72 hours. Hemoglobin A1C No results for input(s): HGBA1C in the last 72 hours. Fasting Lipid Panel No results for input(s): CHOL, HDL, LDLCALC, TRIG, CHOLHDL, LDLDIRECT in the last 72 hours. Thyroid Function Tests No results for input(s): TSH, T4TOTAL, T3FREE, THYROIDAB in the last 72 hours.  Invalid input(s): FREET3  Other results:     Imaging/Studies:   No results found.    Medications:     Scheduled Medications: .  allopurinol  300 mg Oral Daily  . apixaban  2.5 mg Oral BID  . benzonatate  100 mg Oral TID  . furosemide  60 mg Intravenous BID  . ipratropium-albuterol  3 mL Nebulization TID  . isosorbide mononitrate  30 mg Oral Daily  . levofloxacin (LEVAQUIN) IV  750 mg Intravenous Q48H  . metoprolol succinate  25 mg Oral Daily  . pantoprazole  80 mg Oral Daily  . predniSONE  60 mg Oral Q breakfast  . sodium chloride flush  3 mL Intravenous Q12H  . spironolactone  12.5 mg Oral Daily     Infusions:   PRN Medications:  sodium chloride, acetaminophen **OR** acetaminophen, albuterol, guaiFENesin-dextromethorphan, ipratropium-albuterol, nitroGLYCERIN, ondansetron **OR** ondansetron (ZOFRAN) IV, oxyCODONE, sodium chloride flush   Assessment/Plan   1. A/C Respiratory Failure- Dyspnea.  -  Multi-factorial given volume overload and COPD exacerbation.  - On ABX and duonebs. Sputum Cx pending.  2.A/C Systolic Heart Failure- ICM . ECHO 01/28/2016 EF 10-15% . ST Jude CRT-D - Have asked ST jude to interrogate device.  NYHA IV. - Remains volume overloaded on exam.  - Increase lasix to 80 mg IV BID and add dose of metolazone 2.5 mg daily.  - No ARNI with CKD. Continue to follow closely with diuresis - Continue imdur 30 mg daily. Will add hydralazine once better diuresed to preserve renal perfusion for now with diuresis.  - Hold BB for now with concerns for low output.  - Continue spiro 12.5 mg daily.  3. HTN  - Meds as above.   4. AKI/CKD Stage  - Elevated but stable. Follow closely with diuresis.  5. CAD- S/P CABG 2004 - Stable. 6. Chronic A fib - controlled rate. Continue low dose bb. Currently on eliquis 5 mg twice a day but due to age > 80, weight < 60 kg,  and creatinine >1.5 will need to cut back to 2.5 mg twice a day.  - No change to current plan.  7. H/O Black Lung Disease 8. H/O Prostate Cancer- 2012 seed implant   Length of Stay: 1  Shirley Friar, Hershal Coria  04/03/2016, 11:20 AM  Advanced Heart Failure Team Pager [phone] (M-F; 7a - 4p)  Please contact [facility] for night-coverage after hours (4p -7a ) and weekends on [email or web address]   Patient seen and examined with Oda Kilts, PA-C. We discussed all aspects of the encounter. I agree with the assessment and plan as stated above.   He is diuresing but still with some volume on board. Will increase lasix and give one dose of metolazone. Place TED hose. Will need paramedicine.  Claxton Levitz,MD 5:35 PM

## 2016-04-04 DIAGNOSIS — J60 Coalworker's pneumoconiosis: Secondary | ICD-10-CM

## 2016-04-04 DIAGNOSIS — Z950 Presence of cardiac pacemaker: Secondary | ICD-10-CM

## 2016-04-04 DIAGNOSIS — N183 Chronic kidney disease, stage 3 (moderate): Secondary | ICD-10-CM

## 2016-04-04 DIAGNOSIS — J441 Chronic obstructive pulmonary disease with (acute) exacerbation: Secondary | ICD-10-CM

## 2016-04-04 DIAGNOSIS — I482 Chronic atrial fibrillation: Secondary | ICD-10-CM

## 2016-04-04 DIAGNOSIS — D539 Nutritional anemia, unspecified: Secondary | ICD-10-CM

## 2016-04-04 DIAGNOSIS — I459 Conduction disorder, unspecified: Secondary | ICD-10-CM

## 2016-04-04 LAB — CBC
HCT: 30.2 % — ABNORMAL LOW (ref 39.0–52.0)
Hemoglobin: 9.7 g/dL — ABNORMAL LOW (ref 13.0–17.0)
MCH: 31.1 pg (ref 26.0–34.0)
MCHC: 32.1 g/dL (ref 30.0–36.0)
MCV: 96.8 fL (ref 78.0–100.0)
PLATELETS: 114 10*3/uL — AB (ref 150–400)
RBC: 3.12 MIL/uL — ABNORMAL LOW (ref 4.22–5.81)
RDW: 17.8 % — AB (ref 11.5–15.5)
WBC: 4.5 10*3/uL (ref 4.0–10.5)

## 2016-04-04 LAB — BASIC METABOLIC PANEL
Anion gap: 14 (ref 5–15)
BUN: 36 mg/dL — AB (ref 6–20)
CALCIUM: 9.3 mg/dL (ref 8.9–10.3)
CO2: 31 mmol/L (ref 22–32)
CREATININE: 1.73 mg/dL — AB (ref 0.61–1.24)
Chloride: 90 mmol/L — ABNORMAL LOW (ref 101–111)
GFR calc Af Amer: 41 mL/min — ABNORMAL LOW (ref >60)
GFR, EST NON AFRICAN AMERICAN: 35 mL/min — AB (ref >60)
GLUCOSE: 111 mg/dL — AB (ref 65–99)
POTASSIUM: 4.4 mmol/L (ref 3.5–5.1)
SODIUM: 135 mmol/L (ref 135–145)

## 2016-04-04 LAB — PROCALCITONIN: PROCALCITONIN: 0.42 ng/mL

## 2016-04-04 MED ORDER — METOLAZONE 2.5 MG PO TABS
2.5000 mg | ORAL_TABLET | Freq: Once | ORAL | Status: AC
  Administered 2016-04-04: 2.5 mg via ORAL
  Filled 2016-04-04: qty 1

## 2016-04-04 NOTE — Progress Notes (Signed)
Advanced Heart Failure Rounding Note  Referring Physician: Dr Marily Memos Primary Physician: Dr Jeneen Rinks  Primary Cardiologist:  Dr Lovena Le   Reason for Consultation: A/C Systolic Heart Failure   Subjective:    Admitted 04/02/16 with marked volume overload. BNP 3000  Yesterday diuresed with IV lasix + metolazone.  Weight down 4 pounds. Negative 2 liters. Productive cough. Black sputum.   Mild dyspnea with exertion.   Objective:   Weight Range: 123 lb (55.8 kg) Body mass index is 24.02 kg/m.   Vital Signs:   Temp:  [97.6 F (36.4 C)-97.9 F (36.6 C)] 97.6 F (36.4 C) (01/24 0752) Pulse Rate:  [58-69] 65 (01/24 0752) Resp:  [17-18] 18 (01/24 0752) BP: (119-129)/(50-79) 119/79 (01/24 0752) SpO2:  [98 %-100 %] 100 % (01/24 0754) Weight:  [123 lb (55.8 kg)] 123 lb (55.8 kg) (01/24 0409) Last BM Date: 04/02/16  Weight change: Filed Weights   04/02/16 0923 04/03/16 0444 04/04/16 0409  Weight: 124 lb (56.2 kg) 127 lb 11.2 oz (57.9 kg) 123 lb (55.8 kg)    Intake/Output:   Intake/Output Summary (Last 24 hours) at 04/04/16 0831 Last data filed at 04/04/16 0650  Gross per 24 hour  Intake             1000 ml  Output             3080 ml  Net            -2080 ml     Physical Exam: General: Elderly and chronically ill appearing. Sitting in the chair. NAD HEENT: normal Neck: supple. JVP ~10 Carotids 2+ bilat; no bruits. No thyromegaly or nodule noted.  Cor: PMI nondisplaced. RRR. No M/G/R  Lungs: Crackles throughout. No  4 liters [place] Abdomen: soft, NT, ND, no HSM. No bruits or masses. +BS  Extremities: no cyanosis, clubbing, rash, R and LLE 1+ edema ted hose.  Neuro: alert & orientedx3, cranial nerves grossly intact. moves all 4 extremities w/o difficulty. Affect pleasant  Telemetry: Reviewed, NSR 60s  Labs: CBC  Recent Labs  04/03/16 0348 04/04/16 0412  WBC 3.5* 4.5  HGB 8.7* 9.7*  HCT 26.3* 30.2*  MCV 97.8 96.8  PLT 93* 211*   Basic Metabolic Panel  Recent  Labs  04/03/16 0348 04/04/16 0412  NA 135 135  K 3.6 4.4  CL 96* 90*  CO2 25 31  GLUCOSE 177* 111*  BUN 35* 36*  CREATININE 1.70* 1.73*  CALCIUM 8.7* 9.3   Liver Function Tests No results for input(s): AST, ALT, ALKPHOS, BILITOT, PROT, ALBUMIN in the last 72 hours. No results for input(s): LIPASE, AMYLASE in the last 72 hours. Cardiac Enzymes No results for input(s): CKTOTAL, CKMB, CKMBINDEX, TROPONINI in the last 72 hours.  BNP: BNP (last 3 results)  Recent Labs  03/21/16 1816 03/29/16 1951 04/02/16 0925  BNP 2,909.3* 2,710.1* 3,007.2*    ProBNP (last 3 results) No results for input(s): PROBNP in the last 8760 hours.   D-Dimer No results for input(s): DDIMER in the last 72 hours. Hemoglobin A1C No results for input(s): HGBA1C in the last 72 hours. Fasting Lipid Panel No results for input(s): CHOL, HDL, LDLCALC, TRIG, CHOLHDL, LDLDIRECT in the last 72 hours. Thyroid Function Tests No results for input(s): TSH, T4TOTAL, T3FREE, THYROIDAB in the last 72 hours.  Invalid input(s): FREET3  Other results:     Imaging/Studies:  No results found.    Medications:     Scheduled Medications: . allopurinol  300 mg Oral  Daily  . apixaban  2.5 mg Oral BID  . benzonatate  100 mg Oral TID  . furosemide  80 mg Intravenous BID  . ipratropium-albuterol  3 mL Nebulization TID  . isosorbide mononitrate  30 mg Oral Daily  . levofloxacin (LEVAQUIN) IV  750 mg Intravenous Q48H  . pantoprazole  80 mg Oral Daily  . predniSONE  60 mg Oral Q breakfast  . sodium chloride flush  3 mL Intravenous Q12H  . spironolactone  12.5 mg Oral Daily    Infusions:   PRN Medications: sodium chloride, acetaminophen **OR** acetaminophen, albuterol, guaiFENesin-dextromethorphan, ipratropium-albuterol, nitroGLYCERIN, ondansetron **OR** ondansetron (ZOFRAN) IV, oxyCODONE, sodium chloride flush   Assessment/Plan   1. A/C Respiratory Failure- Dyspnea.  - Multi-factorial given volume  overload and COPD exacerbation.  - On ABX and duonebs. Sputum Cx pending.  2.A/C Systolic Heart Failure- ICM . ECHO 01/28/2016 EF 10-15% . ST Jude CRT-D - Have asked ST jude to interrogate device.  Volume status improving. Negative 2 liters. Needs more IV diuresis. Continue lasix to 80 mg IV BID and add dose of metolazone 2.5 mg daily.  - No ARNI with CKD. Continue to follow closely with diuresis - Continue imdur 30 mg daily. Hold off on  hydralazine can add as he improves.   - Hold BB for now with concerns for low output.  - Continue spiro 12.5 mg daily.  3. HTN  - Meds as above.   4. AKI/CKD Stage  - Elevated but stable. Follow closely with diuresis.  5. CAD- S/P CABG 2004 - Stable. 6. Chronic A fib - controlled rate. Continue low dose bb. Continue reduced dose eliquis 2.5 mg twice a day but due to age > 36, weight < 60 kg,  and creatinine >1.5.  - No change to current plan.  7. H/O Black Lung Disease 8. H/O Prostate Cancer- 2012 seed implant  Consult cardiac rehab.   Length of Stay: 2  Darrick Grinder, NP  04/04/2016, 8:31 AM  Advanced Heart Failure Team Pager [phone] (M-F; 7a - 4p)  Please contact [facility] for night-coverage after hours (4p -7a ) and weekends on [email or web address]  Patient seen and examined with Darrick Grinder, NP. We discussed all aspects of the encounter. I agree with the assessment and plan as stated above.   He is diuresing but still volume overloaded. Renal function stable. Would continue IV diuresis. Add metolazone. Hypokalemia improved. Watch not to overshoot with CKD. Continue eliquis for AF.  Bensimhon, Daniel,MD 8:47 PM

## 2016-04-04 NOTE — Progress Notes (Signed)
PROGRESS NOTE    [name]  JSE:[id number] DOB: [birth year] DOA: 04/02/2016 PCP: [name], MD   Chief Complaint  Patient presents with  . Shortness of Breath    Brief Narrative:  HPI on 04/02/2016 by Dr. Linna Darner [name] is a 83 y.o. male with medical history significant of CAD, chronic respiratory failure on O2 secondary to advanced COPD, diabetes, CVA, GERD, HLD, HTN, prostate cancer, lung cancer, nephrolithiasis, symptomatic bradycardia status post pacemaker placement. Patient complaining of worsening shortness of breath. Worse than baseline. Patient on 2-9 L nasal cannula at baseline at home. Patient seen in the ED for similar complaint on 03/21/2016 and 03/29/2016. During those visits patient was diagnosed with COPD exacerbation and CHF exacerbation. Symptoms were felt to be mild and okay for outpatient therapies including an increased dose of Lasix. Patient was also placed on prednisone and azithromycin at his last ED visit on the 18th. Patient is complaining of 3 days history of significant worsening of shortness of breath. Associated with orthopnea, productive cough, chest discomfort with coughing, increasing lower extremity swelling. Denies fevers, palpitations, LOC, neck stiffness, abdominal pain, dysuria, frequency, diarrhea, back pain.  Assessment & Plan   Acute on chronic respiratory failure -Uses home oxygen -Likely multifactorial: CHF and COPD exacerbation -Influenza negative  Acute on chronic systolic heart failure -upon admission, BNP >3000 -EF 10-15% -Has St. Jude CRT-D -Cardiology consulted and appreciated -Continue spironolactone, IV lasix '80mg'$  IV BID -Ws given one dose of metolazone -BB held due to concern for low output -monitor intake/output, daily weights -urine output over past 24hrs 3080cc -Paramedicine arranged  Acute on chronic COPD exacerbation.  -Continue oxygen, Tessalon pearl, nebs, levaquin, prednisone  Chronic Afib /  bradycardia -s/p pacemaker placement >12 yrs ago.  -continue eliquis -metoprolol held   CAD -s/p CABG in 2004 -stable, no complaints of chest pain -continue imdur  Hypertension -continue diuresis -metoprolol held  Chronic kidney disease, stage III -Cr 1.77 at baseline -Continue to monitor closely given diuresis   Chronic Anemia -hemoglobin currently 9.7 -Continue to monitor CBC  Goals of care -Patient has history of black lung disease and EF 10% -Discussed code status with patient, stated "do all you can do, I'm too young to die." -Agreed with palliative care consult, would like more information  DVT Prophylaxis  Eliquis  Code Status: Full  Family Communication: None at bedside  Disposition Plan: Admitted. Continue to diurese.   Consultants Cardiology/CHF Palliative care  Procedures  None  Antibiotics   Anti-infectives    Start     Dose/Rate Route Frequency Ordered Stop   04/02/16 1400  levofloxacin (LEVAQUIN) IVPB 750 mg     750 mg 100 mL/hr over 90 Minutes Intravenous Every 48 hours 04/02/16 1330        Subjective:   [name] seen and examined today. Patient feels his breathing has improved, but still has shortness of breath with movement.  Feels his legs are still swollen but improved. Denies chest pain, abdominal pain, N/V/D/C.   Objective:   Vitals:   04/04/16 0409 04/04/16 0752 04/04/16 0754 04/04/16 1432  BP: 125/74 119/79    Pulse: (!) 59 65    Resp:  18    Temp: 97.9 F (36.6 C) 97.6 F (36.4 C)    TempSrc: Oral Oral    SpO2: 100% 100% 100% 99%  Weight: 55.8 kg (123 lb)     Height:        Intake/Output Summary (Last 24 hours) at 04/04/16  [place] filed at 04/04/16 1346  Gross per 24 hour  Intake              700 ml  Output             3570 ml  Net            -2870 ml   Filed Weights   04/02/16 0923 04/03/16 0444 04/04/16 0409  Weight: 56.2 kg (124 lb) 57.9 kg (127 lb 11.2 oz) 55.8 kg (123 lb)    Exam  General:  Well developed, well nourished, NAD, appears stated age  HEENT: NCAT,  mucous membranes moist.   Neck: Supple, no JVD, no masses  Cardiovascular: S1 S2 auscultated, RRR, no murmurs  Respiratory: Diminished, mild crackles noted.  Abdomen: Soft, nontender, nondistended, + bowel sounds  Extremities: warm dry without cyanosis clubbing. B/L 1+ LE edema. TED hose in place.  Neuro: AAOx3, nonfocal  Psych: Normal affect and demeanor with intact judgement and insight   Data Reviewed: I have personally reviewed following labs and imaging studies  CBC:  Recent Labs Lab 03/29/16 1414 04/02/16 0925 04/03/16 0348 04/04/16 0412  WBC 2.8* 7.9 3.5* 4.5  HGB 9.1* 9.4* 8.7* 9.7*  HCT 29.1* 30.3* 26.3* 30.2*  MCV 101.0* 100.7* 97.8 96.8  PLT 93* 130* 93* 151*   Basic Metabolic Panel:  Recent Labs Lab 03/29/16 1414 04/02/16 0925 04/03/16 0348 04/04/16 0412  NA 135 135 135 135  K 4.8 4.6 3.6 4.4  CL 97* 97* 96* 90*  CO2 27 20* 25 31  GLUCOSE 117* 111* 177* 111*  BUN 29* 34* 35* 36*  CREATININE 1.70* 1.77* 1.70* 1.73*  CALCIUM 9.1 9.4 8.7* 9.3   GFR: Estimated Creatinine Clearance: 23.7 mL/min (by C-G formula based on SCr of 1.73 mg/dL (H)). Liver Function Tests: No results for input(s): AST, ALT, ALKPHOS, BILITOT, PROT, ALBUMIN in the last 168 hours. No results for input(s): LIPASE, AMYLASE in the last 168 hours. No results for input(s): AMMONIA in the last 168 hours. Coagulation Profile: No results for input(s): INR, PROTIME in the last 168 hours. Cardiac Enzymes: No results for input(s): CKTOTAL, CKMB, CKMBINDEX, TROPONINI in the last 168 hours. BNP (last 3 results) No results for input(s): PROBNP in the last 8760 hours. HbA1C: No results for input(s): HGBA1C in the last 72 hours. CBG: No results for input(s): GLUCAP in the last 168 hours. Lipid Profile: No results for input(s): CHOL, HDL, LDLCALC, TRIG, CHOLHDL, LDLDIRECT in the last 72 hours. Thyroid Function  Tests: No results for input(s): TSH, T4TOTAL, FREET4, T3FREE, THYROIDAB in the last 72 hours. Anemia Panel: No results for input(s): VITAMINB12, FOLATE, FERRITIN, TIBC, IRON, RETICCTPCT in the last 72 hours. Urine analysis:    Component Value Date/Time   COLORURINE YELLOW 01/21/2016 0114   APPEARANCEUR CLEAR 01/21/2016 0114   LABSPEC 1.012 01/21/2016 0114   LABSPEC 1.015 05/03/2011 0950   PHURINE 6.0 01/21/2016 0114   GLUCOSEU NEGATIVE 01/21/2016 0114   HGBUR SMALL (A) 01/21/2016 0114   BILIRUBINUR NEGATIVE 01/21/2016 0114   BILIRUBINUR Color Interference 05/03/2011 0950   KETONESUR NEGATIVE 01/21/2016 0114   PROTEINUR NEGATIVE 01/21/2016 0114   UROBILINOGEN 0.2 11/10/2014 1815   NITRITE NEGATIVE 01/21/2016 0114   LEUKOCYTESUR NEGATIVE 01/21/2016 0114   LEUKOCYTESUR Color Interference 05/03/2011 0950   Sepsis Labs: '@LABRCNTIP'$ (procalcitonin:4,lacticidven:4)  ) Recent Results (from the past 240 hour(s))  MRSA PCR Screening     Status: None   Collection Time: 04/02/16  5:05 PM  Result  Value Ref Range Status   MRSA by PCR NEGATIVE NEGATIVE Final    Comment:        The GeneXpert MRSA Assay (FDA approved for NASAL specimens only), is one component of a comprehensive MRSA colonization surveillance program. It is not intended to diagnose MRSA infection nor to guide or monitor treatment for MRSA infections.       Radiology Studies: No results found.   Scheduled Meds: . allopurinol  300 mg Oral Daily  . apixaban  2.5 mg Oral BID  . benzonatate  100 mg Oral TID  . furosemide  80 mg Intravenous BID  . ipratropium-albuterol  3 mL Nebulization TID  . isosorbide mononitrate  30 mg Oral Daily  . levofloxacin (LEVAQUIN) IV  750 mg Intravenous Q48H  . pantoprazole  80 mg Oral Daily  . predniSONE  60 mg Oral Q breakfast  . sodium chloride flush  3 mL Intravenous Q12H  . spironolactone  12.5 mg Oral Daily   Continuous Infusions:   LOS: 2 days   Time Spent in minutes    30 minutes  Saskia Simerson [name] on 04/04/2016 at 2:46 PM  Between 7am to 7pm - Pager - [phone]  After 7pm go to [email or web address] - password [email or web address]  And look for the night coverage person covering for me after hours  [facility] Office  [phone]

## 2016-04-05 DIAGNOSIS — Z7189 Other specified counseling: Secondary | ICD-10-CM

## 2016-04-05 LAB — CBC
HCT: 30.3 % — ABNORMAL LOW (ref 39.0–52.0)
Hemoglobin: 10 g/dL — ABNORMAL LOW (ref 13.0–17.0)
MCH: 31.3 pg (ref 26.0–34.0)
MCHC: 33 g/dL (ref 30.0–36.0)
MCV: 95 fL (ref 78.0–100.0)
PLATELETS: 137 10*3/uL — AB (ref 150–400)
RBC: 3.19 MIL/uL — ABNORMAL LOW (ref 4.22–5.81)
RDW: 17.3 % — AB (ref 11.5–15.5)
WBC: 4.1 10*3/uL (ref 4.0–10.5)

## 2016-04-05 LAB — BASIC METABOLIC PANEL
Anion gap: 13 (ref 5–15)
BUN: 38 mg/dL — AB (ref 6–20)
CHLORIDE: 83 mmol/L — AB (ref 101–111)
CO2: 36 mmol/L — AB (ref 22–32)
CREATININE: 1.72 mg/dL — AB (ref 0.61–1.24)
Calcium: 9.3 mg/dL (ref 8.9–10.3)
GFR calc Af Amer: 41 mL/min — ABNORMAL LOW (ref >60)
GFR calc non Af Amer: 36 mL/min — ABNORMAL LOW (ref >60)
Glucose, Bld: 107 mg/dL — ABNORMAL HIGH (ref 65–99)
Potassium: 3.5 mmol/L (ref 3.5–5.1)
Sodium: 132 mmol/L — ABNORMAL LOW (ref 135–145)

## 2016-04-05 LAB — EXPECTORATED SPUTUM ASSESSMENT W GRAM STAIN, RFLX TO RESP C

## 2016-04-05 LAB — EXPECTORATED SPUTUM ASSESSMENT W REFEX TO RESP CULTURE

## 2016-04-05 MED ORDER — FUROSEMIDE 40 MG PO TABS
40.0000 mg | ORAL_TABLET | Freq: Two times a day (BID) | ORAL | Status: DC
  Administered 2016-04-05 – 2016-04-06 (×2): 40 mg via ORAL
  Filled 2016-04-05 (×2): qty 1

## 2016-04-05 MED ORDER — LEVOFLOXACIN 750 MG PO TABS
750.0000 mg | ORAL_TABLET | ORAL | Status: DC
  Administered 2016-04-06: 750 mg via ORAL
  Filled 2016-04-05: qty 1

## 2016-04-05 MED ORDER — LOSARTAN POTASSIUM 25 MG PO TABS
25.0000 mg | ORAL_TABLET | Freq: Every day | ORAL | Status: DC
  Administered 2016-04-05: 25 mg via ORAL
  Filled 2016-04-05: qty 1

## 2016-04-05 NOTE — Progress Notes (Signed)
PROGRESS NOTE    [name]  WER:[id number] DOB: Apr 02, 1932 DOA: 04/02/2016 PCP: [name], MD   Chief Complaint  Patient presents with  . Shortness of Breath    Brief Narrative:  HPI on 04/02/2016 by Dr. Linna Darner MALE [name] is a 83 y.o. male with medical history significant of CAD, chronic respiratory failure on O2 secondary to advanced COPD, diabetes, CVA, GERD, HLD, HTN, prostate cancer, lung cancer, nephrolithiasis, symptomatic bradycardia status post pacemaker placement. Patient complaining of worsening shortness of breath. Worse than baseline. Patient on 2-9 L nasal cannula at baseline at home. Patient seen in the ED for similar complaint on 03/21/2016 and 03/29/2016. During those visits patient was diagnosed with COPD exacerbation and CHF exacerbation. Symptoms were felt to be mild and okay for outpatient therapies including an increased dose of Lasix. Patient was also placed on prednisone and azithromycin at his last ED visit on the 18th. Patient is complaining of 3 days history of significant worsening of shortness of breath. Associated with orthopnea, productive cough, chest discomfort with coughing, increasing lower extremity swelling. Denies fevers, palpitations, LOC, neck stiffness, abdominal pain, dysuria, frequency, diarrhea, back pain.  Assessment & Plan   Acute on chronic respiratory failure -Uses home oxygen -Likely multifactorial: CHF and COPD exacerbation -Influenza negative  Acute on chronic systolic heart failure -upon admission, BNP >3000 -EF 10-15% -Has [employer] Jude CRT-D -Cardiology consulted and appreciated -Continue spironolactone, IV lasix '80mg'$  IV BID -Ws given one dose of metolazone -BB held due to concern for low output -monitor intake/output, daily weights -urine output over past 24hrs 3200cc -Paramedicine arranged  Acute on chronic COPD exacerbation.  -Continue oxygen, Tessalon pearl, nebs, levaquin, prednisone  Chronic Afib /  bradycardia -s/p pacemaker placement >12 yrs ago.  -continue eliquis -metoprolol held   CAD -s/p CABG in 2004 -stable, no complaints of chest pain -continue imdur  Hypertension -continue diuresis -metoprolol held  Chronic kidney disease, stage III -Cr 1.72, close to his baseline  -Continue to monitor closely given diuresis   Chronic Anemia -hemoglobin currently 10 -Continue to monitor CBC  Goals of care -Patient has history of black lung disease and EF 10% -Discussed code status with patient, stated "do all you can do, I'm too young to die." -Agreed with palliative care consult, would like more information  DVT Prophylaxis  Eliquis  Code Status: Full  Family Communication: None at bedside  Disposition Plan: Admitted. Continue to diurese.   Consultants Cardiology/CHF Palliative care  Procedures  None  Antibiotics   Anti-infectives    Start     Dose/Rate Route Frequency Ordered Stop   04/02/16 1400  levofloxacin (LEVAQUIN) IVPB 750 mg     750 mg 100 mL/hr over 90 Minutes Intravenous Every 48 hours 04/02/16 1330        Subjective:   [name] seen and examined today. Patient feels his breathing has improved, but still has shortness of breath when he moves. Feels his legs are still swollen but improved.  Denies chest pain, abdominal pain, N/V/D/C.   Objective:   Vitals:   04/04/16 2124 04/05/16 0511 04/05/16 0819 04/05/16 1000  BP:  (!) 116/55  (!) 113/58  Pulse:  66  63  Resp:  18    Temp:  98.1 F (36.7 C)    TempSrc:  Oral    SpO2: 97% 97% 97%   Weight:  52.8 kg (116 lb 8 oz)    Height:        Intake/Output Summary (Last  24 hours) at 04/05/16 1109 Last data filed at 04/05/16 1100  Gross per 24 hour  Intake             1320 ml  Output             3201 ml  Net            -1881 ml   Filed Weights   04/03/16 0444 04/04/16 0409 04/05/16 0511  Weight: 57.9 kg (127 lb 11.2 oz) 55.8 kg (123 lb) 52.8 kg (116 lb 8 oz)    Exam  General:  Well developed, well nourished, NAD, appears stated age  HEENT: NCAT,  mucous membranes moist.   Cardiovascular: S1 S2 auscultated, RRR, no murmurs  Respiratory: Coarse breath sounds, crackles noted  Abdomen: Soft, nontender, nondistended, + bowel sounds  Extremities: warm dry without cyanosis clubbing. B/L 1+ LE edema. TED hose in place.  Neuro: AAOx3, nonfocal  Psych: Appropriate mood and affect   Data Reviewed: I have personally reviewed following labs and imaging studies  CBC:  Recent Labs Lab 03/29/16 1414 04/02/16 0925 04/03/16 0348 04/04/16 0412 04/05/16 0349  WBC 2.8* 7.9 3.5* 4.5 4.1  HGB 9.1* 9.4* 8.7* 9.7* 10.0*  HCT 29.1* 30.3* 26.3* 30.2* 30.3*  MCV 101.0* 100.7* 97.8 96.8 95.0  PLT 93* 130* 93* 114* 948*   Basic Metabolic Panel:  Recent Labs Lab 03/29/16 1414 04/02/16 0925 04/03/16 0348 04/04/16 0412 04/05/16 0349  NA 135 135 135 135 132*  K 4.8 4.6 3.6 4.4 3.5  CL 97* 97* 96* 90* 83*  CO2 27 20* 25 31 36*  GLUCOSE 117* 111* 177* 111* 107*  BUN 29* 34* 35* 36* 38*  CREATININE 1.70* 1.77* 1.70* 1.73* 1.72*  CALCIUM 9.1 9.4 8.7* 9.3 9.3   GFR: Estimated Creatinine Clearance: 23.8 mL/min (by C-G formula based on SCr of 1.72 mg/dL (H)). Liver Function Tests: No results for input(s): AST, ALT, ALKPHOS, BILITOT, PROT, ALBUMIN in the last 168 hours. No results for input(s): LIPASE, AMYLASE in the last 168 hours. No results for input(s): AMMONIA in the last 168 hours. Coagulation Profile: No results for input(s): INR, PROTIME in the last 168 hours. Cardiac Enzymes: No results for input(s): CKTOTAL, CKMB, CKMBINDEX, TROPONINI in the last 168 hours. BNP (last 3 results) No results for input(s): PROBNP in the last 8760 hours. HbA1C: No results for input(s): HGBA1C in the last 72 hours. CBG: No results for input(s): GLUCAP in the last 168 hours. Lipid Profile: No results for input(s): CHOL, HDL, LDLCALC, TRIG, CHOLHDL, LDLDIRECT in the last 72  hours. Thyroid Function Tests: No results for input(s): TSH, T4TOTAL, FREET4, T3FREE, THYROIDAB in the last 72 hours. Anemia Panel: No results for input(s): VITAMINB12, FOLATE, FERRITIN, TIBC, IRON, RETICCTPCT in the last 72 hours. Urine analysis:    Component Value Date/Time   COLORURINE YELLOW 01/21/2016 0114   APPEARANCEUR CLEAR 01/21/2016 0114   LABSPEC 1.012 01/21/2016 0114   LABSPEC 1.015 05/03/2011 0950   PHURINE 6.0 01/21/2016 0114   GLUCOSEU NEGATIVE 01/21/2016 0114   HGBUR SMALL (A) 01/21/2016 0114   BILIRUBINUR NEGATIVE 01/21/2016 0114   BILIRUBINUR Color Interference 05/03/2011 0950   KETONESUR NEGATIVE 01/21/2016 0114   PROTEINUR NEGATIVE 01/21/2016 0114   UROBILINOGEN 0.2 11/10/2014 1815   NITRITE NEGATIVE 01/21/2016 0114   LEUKOCYTESUR NEGATIVE 01/21/2016 0114   LEUKOCYTESUR Color Interference 05/03/2011 0950   Sepsis Labs: '@LABRCNTIP'$ (procalcitonin:4,lacticidven:4)  ) Recent Results (from the past 240 hour(s))  MRSA PCR Screening     Status:  None   Collection Time: 04/02/16  5:05 PM  Result Value Ref Range Status   MRSA by PCR NEGATIVE NEGATIVE Final    Comment:        The GeneXpert MRSA Assay (FDA approved for NASAL specimens only), is one component of a comprehensive MRSA colonization surveillance program. It is not intended to diagnose MRSA infection nor to guide or monitor treatment for MRSA infections.       Radiology Studies: No results found.   Scheduled Meds: . allopurinol  300 mg Oral Daily  . apixaban  2.5 mg Oral BID  . benzonatate  100 mg Oral TID  . furosemide  80 mg Intravenous BID  . ipratropium-albuterol  3 mL Nebulization TID  . isosorbide mononitrate  30 mg Oral Daily  . levofloxacin (LEVAQUIN) IV  750 mg Intravenous Q48H  . pantoprazole  80 mg Oral Daily  . predniSONE  60 mg Oral Q breakfast  . sodium chloride flush  3 mL Intravenous Q12H  . spironolactone  12.5 mg Oral Daily   Continuous Infusions:   LOS: 3 days    Time Spent in minutes   30 minutes  Leannah Guse [name] on 04/05/2016 at 11:09 AM  Between 7am to 7pm - Pager - [phone]  After 7pm go to [email or web address] - password [email or web address]  And look for the night coverage person covering for me after hours  [facility] Office  [phone]

## 2016-04-05 NOTE — Progress Notes (Signed)
Pharmacy Antibiotic Note  [name] is a 83 y.o. male who continues on day #4 levaquin for COPD exacerbation. SCr elevated but stable with CrCl ~ 24 ml/min.   Plan: 1) Continue levaquin '750mg'$  q48 but change to PO today 2) Follow up LOT  Height: 5' (152.4 cm) Weight: 116 lb 8 oz (52.8 kg) (Scale B) IBW/kg (Calculated) : 50  Temp (24hrs), Avg:98 F (36.7 C), Min:97.8 F (36.6 C), Max:98.2 F (36.8 C)   Recent Labs Lab 03/29/16 1414 04/02/16 0925 04/03/16 0348 04/04/16 0412 04/05/16 0349  WBC 2.8* 7.9 3.5* 4.5 4.1  CREATININE 1.70* 1.77* 1.70* 1.73* 1.72*    Estimated Creatinine Clearance: 23.8 mL/min (by C-G formula based on SCr of 1.72 mg/dL (H)).    Allergies  Allergen Reactions  . [place] [Digoxin] Other (See Comments)    Caused patient to have chest pains and dizzy spells  . Aspirin Hives  . Penicillins Rash    Rash Has patient had a PCN reaction causing immediate rash, facial/tongue/throat swelling, SOB or lightheadedness with hypotension:YES Has patient had a PCN reaction causing severe rash involving mucus membranes or skin necrosis: NO Has patient had a PCN reaction that required hospitalization NO Has patient had a PCN reaction occurring within the last 10 years:NO If all of the above answers are "NO", then may proceed with Cephalosporin use.    Antimicrobials this admission: 1/22 Levaquin >>  Dose adjustments this admission: n/a  Microbiology results: 1/22 MRSA PCR negative  Thank you for allowing pharmacy to be a part of this patient's care.  Deboraha Sprang 04/05/2016 11:38 AM

## 2016-04-05 NOTE — Progress Notes (Signed)
Order seen for call to [employer] Vendor for turn off.  Per records in chart. Pt has a Ecologist.  It is this nurse's understanding that the cardiology department and/or palliative team complete this phone call.

## 2016-04-05 NOTE — Progress Notes (Signed)
This encounter was created in error - please disregard.

## 2016-04-05 NOTE — Progress Notes (Signed)
Advanced Heart Failure Rounding Note  Referring Physician: Dr Marily Memos Primary Physician: Dr Jeneen Rinks  Primary Cardiologist:  Dr Lovena Le   Reason for Consultation: A/C Systolic Heart Failure   Subjective:    Admitted 04/02/16 with marked volume overload. BNP 3000  Diuresed very well overnight. Weight down 7 pounds. Breathing much better. Having leg cramps.   Objective:   Weight Range: 52.8 kg (116 lb 8 oz) Body mass index is 22.75 kg/m.   Vital Signs:   Temp:  [97.8 F (36.6 C)-98.2 F (36.8 C)] 98.1 F (36.7 C) (01/25 0511) Pulse Rate:  [63-74] 63 (01/25 1000) Resp:  [18-20] 18 (01/25 0511) BP: (101-116)/(52-58) 113/58 (01/25 1000) SpO2:  [97 %-100 %] 97 % (01/25 0819) FiO2 (%):  [21 %] 21 % (01/25 0819) Weight:  [52.8 kg (116 lb 8 oz)] 52.8 kg (116 lb 8 oz) (01/25 0511) Last BM Date: 04/04/16  Weight change: Filed Weights   04/03/16 0444 04/04/16 0409 04/05/16 0511  Weight: 57.9 kg (127 lb 11.2 oz) 55.8 kg (123 lb) 52.8 kg (116 lb 8 oz)    Intake/Output:   Intake/Output Summary (Last 24 hours) at 04/05/16 1251 Last data filed at 04/05/16 1100  Gross per 24 hour  Intake             1320 ml  Output             3201 ml  Net            -1881 ml     Physical Exam: General: Lying in bed NAD HEENT: normal Neck: supple. JVP ~6 Carotids 2+ bilat; no bruits. No thyromegaly or nodule noted.  Cor: PMI nondisplaced. RRR. No M/G/R  Lungs: Clear. No wheezing Abdomen: soft, NT, ND, no HSM. No bruits or masses. +BS  Extremities: no cyanosis, clubbing, rash, R and LLE tr edema ted hose.  Neuro: alert & orientedx3, cranial nerves grossly intact. moves all 4 extremities w/o difficulty. Affect pleasant  Telemetry: Reviewed, NSR 60s  Labs: CBC  Recent Labs  04/04/16 0412 04/05/16 0349  WBC 4.5 4.1  HGB 9.7* 10.0*  HCT 30.2* 30.3*  MCV 96.8 95.0  PLT 114* 637*   Basic Metabolic Panel  Recent Labs  04/04/16 0412 04/05/16 0349  NA 135 132*  K 4.4 3.5  CL  90* 83*  CO2 31 36*  GLUCOSE 111* 107*  BUN 36* 38*  CREATININE 1.73* 1.72*  CALCIUM 9.3 9.3   Liver Function Tests No results for input(s): AST, ALT, ALKPHOS, BILITOT, PROT, ALBUMIN in the last 72 hours. No results for input(s): LIPASE, AMYLASE in the last 72 hours. Cardiac Enzymes No results for input(s): CKTOTAL, CKMB, CKMBINDEX, TROPONINI in the last 72 hours.  BNP: BNP (last 3 results)  Recent Labs  03/21/16 1816 03/29/16 1951 04/02/16 0925  BNP 2,909.3* 2,710.1* 3,007.2*    ProBNP (last 3 results) No results for input(s): PROBNP in the last 8760 hours.   D-Dimer No results for input(s): DDIMER in the last 72 hours. Hemoglobin A1C No results for input(s): HGBA1C in the last 72 hours. Fasting Lipid Panel No results for input(s): CHOL, HDL, LDLCALC, TRIG, CHOLHDL, LDLDIRECT in the last 72 hours. Thyroid Function Tests No results for input(s): TSH, T4TOTAL, T3FREE, THYROIDAB in the last 72 hours.  Invalid input(s): FREET3  Other results:     Imaging/Studies:  No results found.    Medications:     Scheduled Medications: . allopurinol  300 mg Oral Daily  . apixaban  2.5 mg Oral BID  . benzonatate  100 mg Oral TID  . furosemide  80 mg Intravenous BID  . ipratropium-albuterol  3 mL Nebulization TID  . isosorbide mononitrate  30 mg Oral Daily  . [START ON 04/06/2016] levofloxacin  750 mg Oral Q48H  . pantoprazole  80 mg Oral Daily  . predniSONE  60 mg Oral Q breakfast  . sodium chloride flush  3 mL Intravenous Q12H  . spironolactone  12.5 mg Oral Daily    Infusions:   PRN Medications: sodium chloride, acetaminophen **OR** acetaminophen, albuterol, guaiFENesin-dextromethorphan, ipratropium-albuterol, nitroGLYCERIN, ondansetron **OR** ondansetron (ZOFRAN) IV, oxyCODONE, sodium chloride flush   Assessment/Plan   1. A/C Respiratory Failure- Dyspnea.  - Multi-factorial given volume overload and COPD exacerbation.  - On ABX and duonebs and  prednisone. Per primary team.  Wean prednisone as tolerated.  2.A/C Systolic Heart Failure- ICM . ECHO 01/28/2016 EF 10-15% . ST Jude CRT-D - Have asked ST jude to interrogate device.  --Volume status much improved. Now euvolemic. Will switch to po diuretics lasix 40 bid. (can increase as needed). Would keep in house to make sure weight stable on po regimen ads he has had multiple readmissions   - No ARNI with CKD. Continue to follow closely with diuresis - Wil start losartan 25 daily. Watch BP and creatinine - Hold BB for now with concerns for low output and sever COPD.  - Continue spiro 12.5 mg daily.  - Will need paramedicine f/u on discharge.  3. HTN  - Meds as above.   4. AKI/CKD Stage  - EStable  5. CAD- S/P CABG 2004 - Stable. 6. Chronic A fib - controlled rate. Off bblock due to decompensation. Continue reduced dose eliquis 2.5 mg twice a day but due to age > 10, weight < 60 kg,  and creatinine >1.5.  - No change to current plan.  7. H/O Black Lung Disease 8. H/O Prostate Cancer- 2012 seed implant 9. Hypokalemia. -supp  Length of Stay: 3  Glori Bickers, MD  04/05/2016, 12:51 PM  Advanced Heart Failure Team Pager [phone] (M-F; 7a - 4p)  Please contact [facility] for night-coverage after hours (4p -7a ) and weekends on [email or web address]

## 2016-04-05 NOTE — Consult Note (Signed)
Consultation Note Date: 04/05/2016   Patient Name: [name]  DOB: 1932/06/17  MRN: [id number]  Age / Sex: 83 y.o., male  PCP: [name], MD Referring Physician: Cristal Ford, DO  Reason for Consultation: Establishing goals of care  HPI/Patient Profile: 83 y.o. male  with past medical history of CAD, chronic respiratory failure on O2 secondary to advanced COPD, diabetes, CVA, GERD, HLD, HTN, prostate cancer, lung cancer, nephrolithiasis, symptomatic bradycardia status post CRT-D ([employer]) who presented to the ED with SOB and was admitted on 04/02/2016 with acute on chronic respiratory failure from a likely combination of volume overload related to CHF exacerbation and COPD exacerbation. This is the third time he has presented to the ED for SOB this month. He is improving with medical management. Palliative consulted for [place] conversation.  Clinical Assessment and Goals of Care: [name] is a delightful man that was eager to chat with me. He was able to tell me that he has a long history with heart and lung issues, specifically "i've got a black lung, and my heart doesn't work and I've had some heart attacks." He also knew he had COPD, but wasn't really sure what that was or what it meant ("It sounds familiar"). He did not fully appreciate the degree of heart failure, nor the progressive nature of either the COPD or heart failure. When asked about the future, he expressed the goal of living another ten years and dying at 83yo like his mother did. He did feel that his heart would cause his death, but he couldn't explain why.   For Mr. [name], his greatest joy comes from being at home and taking care of his two dogs and three cats. He also enjoys the occasional walk to check the mail. His goal is to be at home and enjoying those things. That said, he finds benefit from hospitalizations and feels his time here gives him better and longer time at home. We  talked about the progressive nature of his chronic diseases, and I encouraged him to keep that goal of being at home in mind. I shared that there will come a point when he is spending more time in the hospital than at home, and the time he spends at the hospital is not helping him feel better at home. When that times comes, he should consider pursuing Hospice support at home. He was very receptive to this plan and said he would keep that in mind. Of note, his wife died at home with Hospice and he had a very good experience with them.   Finally, I did have a chance to talk with his daughter [name]) over the phone. I shared with her the same information I had with him: keeping his goal in mind, and transitioning to Hospice when the burden of the hospital is greater than the benefit. She was also very supportive of this. I also involved her in a conversation about code status. I asked in the event that his heart should stop or his lungs should stop, what would he like for [name] to do. After discussing it with his daughter, he verbalized a desire to pass peacefully. I clarified that would mean no CPR, no defibrillation, and no intubation, which he agreed with. His daughter also agreed with that decision. Since he does have an implanted defibrillator, I asked that we turn that off to respect his wish to die a natural death. His daughter was under the impression it had already been turned off,  and he thought only the battery had been changed. They both were, however, comfortable with turning it off (if it wasn't already).  Primary Decision Maker PATIENT    SUMMARY OF RECOMMENDATIONS    DNR, otherwise continue to treat what is treatable  Plan for discharge when medically stable with [facility] and [facility] (home palliative care), CM aware and consult placed  Heart failure team notified of change in code status, and order placed to have defibrillator turned off  Code Status/Advance Care  Planning:  DNR  Symptom Management:   Pt has no acute complaints, he feels his breathing is much better and feels he is doing well.  Psycho-social/Spiritual:   Desire for further Chaplaincy support:no  Additional Recommendations: Education on Hospice  Prognosis:   < 6 months  Discharge Planning: Home with Palliative Services      Primary Diagnoses: Present on Admission: . Acute on chronic combined systolic and diastolic CHF (congestive heart failure) ([place]) . Black lung disease ([place]) . Chronic renal disease, stage III . Cardiac conduction disorder . Pacemaker   I have reviewed the medical record, interviewed the patient and family, and examined the patient. The following aspects are pertinent.  Past Medical History:  Diagnosis Date  . Arthritis   . Black lung disease ([place])   . CAD (coronary artery disease)    a. 07/2002 CABG x 3: LIMA->LAD, VG->Diag, VG->OM;  b. 06/2006 Cath: LM 50-60ost/p, LAD patent mid stent, D1 sev dzs, D2 patent stent, LCX nl, OM2 sev sten prox, RCA large/nl, VG->Diag nl, VG->OM nl, LIMA->LAD atretic, EF 30%.  . Carotid artery occlusion   . Chronic atrial fibrillation ([place])   . Chronic respiratory failure ([place])   . Chronic systolic CHF (congestive heart failure) ([place])    a. 12/2012 Echo: EF 20-25%, mid-dist antsept AK, mod dil LA.  [name] COPD (chronic obstructive pulmonary disease) ([place])    a. On home O2.  [name] CVA (cerebral vascular accident) ([place])   . Diabetes mellitus   . GERD (gastroesophageal reflux disease)   . Gout   . Hypercholesterolemia   . Hypertension   . Ischemic cardiomyopathy    a. 06/2006  BIV-ICD -> gen change 2015 to [place].  . Lung cancer ([place])   . Nephrolithiasis 09/2000  . On home oxygen therapy    "2.5L; 24/7" (01/16/2016)  . Prostate cancer ([place]) 11/01/10   gleason 7, 8, 9, gold seeds 02/08/11  . Symptomatic bradycardia    Social History   Social History  . Marital status: Widowed    Spouse name: N/A  . Number  of children: N/A  . Years of education: N/A   Occupational History  . retired Retired    Equities trader   Social History Main Topics  . Smoking status: Former Smoker    Types: Cigarettes    Quit date: 03/12/1949  . Smokeless tobacco: Current User    Types: Chew  . Alcohol use No     Comment: drank until per pt 03/19/11, h/o heavy use  . Drug use: No  . Sexual activity: Yes   Other Topics Concern  . None   Social History Narrative  . None   Family History  Problem Relation Age of Onset  . Alzheimer's disease Father 22  . Cancer Father 82    metastatic prostate cancer  . Diabetes Sister 75  . Diabetes Brother   . Diabetes Brother   . Diabetes Brother   . Hypotension Neg Hx   . Malignant hyperthermia  Neg Hx   . Pseudochol deficiency Neg Hx    Scheduled Meds: . allopurinol  300 mg Oral Daily  . apixaban  2.5 mg Oral BID  . benzonatate  100 mg Oral TID  . furosemide  40 mg Oral BID  . ipratropium-albuterol  3 mL Nebulization TID  . [START ON 04/06/2016] levofloxacin  750 mg Oral Q48H  . losartan  25 mg Oral Daily  . pantoprazole  80 mg Oral Daily  . predniSONE  60 mg Oral Q breakfast  . sodium chloride flush  3 mL Intravenous Q12H  . spironolactone  12.5 mg Oral Daily   Continuous Infusions: PRN Meds:.sodium chloride, acetaminophen **OR** acetaminophen, albuterol, guaiFENesin-dextromethorphan, ipratropium-albuterol, nitroGLYCERIN, ondansetron **OR** ondansetron (ZOFRAN) IV, oxyCODONE, sodium chloride flush Allergies  Allergen Reactions  . [place] [Digoxin] Other (See Comments)    Caused patient to have chest pains and dizzy spells  . Aspirin Hives  . Penicillins Rash    Rash Has patient had a PCN reaction causing immediate rash, facial/tongue/throat swelling, SOB or lightheadedness with hypotension:YES Has patient had a PCN reaction causing severe rash involving mucus membranes or skin necrosis: NO Has patient had a PCN reaction that required hospitalization NO Has  patient had a PCN reaction occurring within the last 10 years:NO If all of the above answers are "NO", then may proceed with Cephalosporin use.   Review of Systems  Constitutional: Positive for activity change and unexpected weight change. Negative for appetite change.  HENT: Negative for sore throat.   Eyes: Negative for visual disturbance.  Respiratory: Positive for shortness of breath. Negative for cough, chest tightness and wheezing.   Gastrointestinal: Negative for abdominal pain.  Musculoskeletal: Positive for gait problem (due to breathing issues).  Neurological: Positive for weakness.  Psychiatric/Behavioral: The patient is not nervous/anxious.    Physical Exam  Constitutional: He is oriented to person, place, and time. No distress.  HENT:  Head: Normocephalic and atraumatic.  Mouth/Throat: No oropharyngeal exudate.  Eyes: EOM are normal.  Neck: Normal range of motion.  Cardiovascular: Bradycardia present.   Pulmonary/Chest: No respiratory distress.  SOB at rest, but appears comfortable  Abdominal: Soft.  Musculoskeletal: Normal range of motion. He exhibits edema (trace in BLE).  Neurological: He is alert and oriented to person, place, and time.  Skin: Skin is warm and dry. There is pallor.  Psychiatric: He has a normal mood and affect. His behavior is normal. Judgment and thought content normal.    Vital Signs: BP (!) 107/57   Pulse (!) 50   Temp 97.8 F (36.6 C) (Oral)   Resp 18   Ht 5' (1.524 m)   Wt 52.8 kg (116 lb 8 oz) Comment: Scale B  SpO2 98%   BMI 22.75 kg/m  Pain Assessment: No/denies pain   Pain Score: 6    SpO2: SpO2: 98 % O2 Device:SpO2: 98 % O2 Flow Rate: .O2 Flow Rate (L/min): 2 L/min  IO: Intake/output summary:  Intake/Output Summary (Last 24 hours) at 04/05/16 1507 Last data filed at 04/05/16 1300  Gross per 24 hour  Intake             1440 ml  Output             3151 ml  Net            -1711 ml    LBM: Last BM Date:  04/04/16 Baseline Weight: Weight: 56.2 kg (124 lb) Most recent weight: Weight: 52.8 kg (116 lb 8 oz) (Scale  B)     Palliative Assessment/Data: PPS 50-60%   Flowsheet Rows   Flowsheet Row Most Recent Value  Intake Tab  Referral Department  Hospitalist  Unit at Time of Referral  Cardiac/Telemetry Unit  Palliative Care Primary Diagnosis  Pulmonary  Date Notified  04/05/16  Palliative Care Type  New Palliative care  Reason for referral  Clarify Goals of Care  Date of Admission  04/02/16  # of days IP prior to Palliative referral  3  Clinical Assessment  Psychosocial & Spiritual Assessment  Palliative Care Outcomes     Time Total: 70 minutes Greater than 50%  of this time was spent counseling and coordinating care related to the above assessment and plan.  Signed by: Charlynn Court, NP Palliative Medicine Team Pager # [phone] (M-F 7a-5p) Team Phone # [phone] (Nights/Weekends)

## 2016-04-05 NOTE — Discharge Instructions (Signed)

## 2016-04-05 NOTE — Progress Notes (Signed)
Order placed for IV team to re-evaluate the peripheral line in patient's right ac, due to the new design of the catheter/extension utilized and inability to flush this am. Requesting back-up troubleshooting.

## 2016-04-06 DIAGNOSIS — Z7189 Other specified counseling: Secondary | ICD-10-CM

## 2016-04-06 LAB — BASIC METABOLIC PANEL
ANION GAP: 17 — AB (ref 5–15)
BUN: 46 mg/dL — ABNORMAL HIGH (ref 6–20)
CALCIUM: 8.9 mg/dL (ref 8.9–10.3)
CO2: 33 mmol/L — ABNORMAL HIGH (ref 22–32)
Chloride: 76 mmol/L — ABNORMAL LOW (ref 101–111)
Creatinine, Ser: 1.95 mg/dL — ABNORMAL HIGH (ref 0.61–1.24)
GFR calc non Af Amer: 31 mL/min — ABNORMAL LOW (ref >60)
GFR, EST AFRICAN AMERICAN: 35 mL/min — AB (ref >60)
Glucose, Bld: 116 mg/dL — ABNORMAL HIGH (ref 65–99)
Potassium: 3.3 mmol/L — ABNORMAL LOW (ref 3.5–5.1)
Sodium: 126 mmol/L — ABNORMAL LOW (ref 135–145)

## 2016-04-06 LAB — CBC
HEMATOCRIT: 30.6 % — AB (ref 39.0–52.0)
Hemoglobin: 10.4 g/dL — ABNORMAL LOW (ref 13.0–17.0)
MCH: 31.7 pg (ref 26.0–34.0)
MCHC: 34 g/dL (ref 30.0–36.0)
MCV: 93.3 fL (ref 78.0–100.0)
PLATELETS: 126 10*3/uL — AB (ref 150–400)
RBC: 3.28 MIL/uL — ABNORMAL LOW (ref 4.22–5.81)
RDW: 17 % — AB (ref 11.5–15.5)
WBC: 2.5 10*3/uL — AB (ref 4.0–10.5)

## 2016-04-06 LAB — PROCALCITONIN: Procalcitonin: 0.21 ng/mL

## 2016-04-06 MED ORDER — APIXABAN 2.5 MG PO TABS: 2.5000 mg | ORAL_TABLET | Freq: Two times a day (BID) | ORAL | 0 refills | Status: DC

## 2016-04-06 MED ORDER — LOSARTAN POTASSIUM 25 MG PO TABS
12.5000 mg | ORAL_TABLET | Freq: Every day | ORAL | Status: DC
  Administered 2016-04-06: 12.5 mg via ORAL
  Filled 2016-04-06: qty 1

## 2016-04-06 MED ORDER — POTASSIUM CHLORIDE CRYS ER 20 MEQ PO TBCR
20.0000 meq | EXTENDED_RELEASE_TABLET | Freq: Once | ORAL | Status: AC
  Administered 2016-04-06: 20 meq via ORAL
  Filled 2016-04-06: qty 1

## 2016-04-06 MED ORDER — LEVOFLOXACIN 750 MG PO TABS: 750.0000 mg | ORAL_TABLET | ORAL | 0 refills | Status: DC

## 2016-04-06 MED ORDER — LOSARTAN POTASSIUM 25 MG PO TABS: 12.5000 mg | ORAL_TABLET | Freq: Every day | ORAL | 0 refills | Status: DC

## 2016-04-06 MED ORDER — PREDNISONE 20 MG PO TABS: ORAL_TABLET | ORAL | 0 refills | Status: DC

## 2016-04-06 MED ORDER — SPIRONOLACTONE 25 MG PO TABS: 12.5000 mg | ORAL_TABLET | Freq: Every day | ORAL | 0 refills | Status: DC

## 2016-04-06 MED ORDER — POTASSIUM CHLORIDE CRYS ER 20 MEQ PO TBCR: 40.0000 meq | EXTENDED_RELEASE_TABLET | Freq: Every day | ORAL | 0 refills | Status: DC

## 2016-04-06 MED ORDER — POTASSIUM CHLORIDE CRYS ER 20 MEQ PO TBCR
40.0000 meq | EXTENDED_RELEASE_TABLET | Freq: Every day | ORAL | Status: DC
  Administered 2016-04-06: 40 meq via ORAL
  Filled 2016-04-06: qty 2

## 2016-04-06 MED ORDER — FUROSEMIDE 40 MG PO TABS: 40.0000 mg | ORAL_TABLET | Freq: Two times a day (BID) | ORAL | 0 refills | Status: DC

## 2016-04-06 NOTE — Progress Notes (Signed)
Advanced Heart Failure Rounding Note  Referring Physician: Dr Marily Memos Primary Physician: Dr Jeneen Rinks  Primary Cardiologist:  Dr Lovena Le   Reason for Consultation: A/C Systolic Heart Failure   Subjective:    Admitted 04/02/16 with marked volume overload. BNP 3000  Continues on po lasix. Yesterday losartan was added. Weight unchanged. Creatinine 1.7>1.95.   Denies SOB.   Objective:   Weight Range: 116 lb 4.8 oz (52.8 kg) Body mass index is 22.71 kg/m.   Vital Signs:   Temp:  [97.7 F (36.5 C)-98 F (36.7 C)] 97.7 F (36.5 C) (01/26 0436) Pulse Rate:  [50-63] 63 (01/26 0436) Resp:  [17-18] 17 (01/26 0436) BP: (97-113)/(55-58) 105/58 (01/26 0436) SpO2:  [97 %-100 %] 100 % (01/26 0436) FiO2 (%):  [21 %] 21 % (01/25 1332) Weight:  [116 lb 4.8 oz (52.8 kg)] 116 lb 4.8 oz (52.8 kg) (01/26 0436) Last BM Date: 04/05/16  Weight change: Filed Weights   04/04/16 0409 04/05/16 0511 04/06/16 0436  Weight: 123 lb (55.8 kg) 116 lb 8 oz (52.8 kg) 116 lb 4.8 oz (52.8 kg)    Intake/Output:   Intake/Output Summary (Last 24 hours) at 04/06/16 0843 Last data filed at 04/06/16 0820  Gross per 24 hour  Intake             1180 ml  Output             2151 ml  Net             -971 ml     Physical Exam: General: Lying in bed NAD HEENT: normal Neck: supple. JVP 5-6 Carotids 2+ bilat; no bruits. No thyromegaly or nodule noted.  Cor: PMI nondisplaced. RRR. No M/G/R  Lungs: Clear. On 2 liters oxygen.  Abdomen: soft, NT, ND, no HSM. No bruits or masses. +BS  Extremities: no cyanosis, clubbing, rash, R and LLE no edema. Ted hose in place.  Neuro: alert & orientedx3, cranial nerves grossly intact. moves all 4 extremities w/o difficulty. Affect pleasant  Telemetry: NSR 60s  Labs: CBC  Recent Labs  04/05/16 0349 04/06/16 0506  WBC 4.1 2.5*  HGB 10.0* 10.4*  HCT 30.3* 30.6*  MCV 95.0 93.3  PLT 137* 932*   Basic Metabolic Panel  Recent Labs  04/05/16 0349 04/06/16 0506  NA  132* 126*  K 3.5 3.3*  CL 83* 76*  CO2 36* 33*  GLUCOSE 107* 116*  BUN 38* 46*  CREATININE 1.72* 1.95*  CALCIUM 9.3 8.9   Liver Function Tests No results for input(s): AST, ALT, ALKPHOS, BILITOT, PROT, ALBUMIN in the last 72 hours. No results for input(s): LIPASE, AMYLASE in the last 72 hours. Cardiac Enzymes No results for input(s): CKTOTAL, CKMB, CKMBINDEX, TROPONINI in the last 72 hours.  BNP: BNP (last 3 results)  Recent Labs  03/21/16 1816 03/29/16 1951 04/02/16 0925  BNP 2,909.3* 2,710.1* 3,007.2*    ProBNP (last 3 results) No results for input(s): PROBNP in the last 8760 hours.   D-Dimer No results for input(s): DDIMER in the last 72 hours. Hemoglobin A1C No results for input(s): HGBA1C in the last 72 hours. Fasting Lipid Panel No results for input(s): CHOL, HDL, LDLCALC, TRIG, CHOLHDL, LDLDIRECT in the last 72 hours. Thyroid Function Tests No results for input(s): TSH, T4TOTAL, T3FREE, THYROIDAB in the last 72 hours.  Invalid input(s): FREET3  Other results:     Imaging/Studies:  No results found.    Medications:     Scheduled Medications: . allopurinol  300 mg Oral Daily  . apixaban  2.5 mg Oral BID  . benzonatate  100 mg Oral TID  . furosemide  40 mg Oral BID  . ipratropium-albuterol  3 mL Nebulization TID  . levofloxacin  750 mg Oral Q48H  . losartan  25 mg Oral Daily  . pantoprazole  80 mg Oral Daily  . predniSONE  60 mg Oral Q breakfast  . sodium chloride flush  3 mL Intravenous Q12H  . spironolactone  12.5 mg Oral Daily    Infusions:   PRN Medications: sodium chloride, acetaminophen **OR** acetaminophen, albuterol, guaiFENesin-dextromethorphan, ipratropium-albuterol, nitroGLYCERIN, ondansetron **OR** ondansetron (ZOFRAN) IV, oxyCODONE, sodium chloride flush   Assessment/Plan   1. A/C Respiratory Failure- Dyspnea.  - Multi-factorial given volume overload and COPD exacerbation.  - On ABX and duonebs and prednisone. Per  primary team.  Wean prednisone as tolerated.  2.A/C Systolic Heart Failure- ICM . ECHO 01/28/2016 EF 10-15% . ST Jude CRT-D --> ICD will be deactivated today.    --Volume status stable. Continue lasix 40 bid.    -- Cut back losartan 12.5 mg daily.  - Hold BB for now with concerns for low output and sever COPD.  - Continue spiro 12.5 mg daily.  - Will need paramedicine f/u on discharge.  3. HTN  - Meds as above.   4. AKI/CKD Stage  - Stable  5. CAD- S/P CABG 2004 - Stable. 6. Chronic A fib - controlled rate. Off bblock due to decompensation. Continue reduced dose eliquis 2.5 mg twice a day but due to age > 59, weight < 60 kg,  and creatinine >1.5.  - No change to current plan.  7. H/O Black Lung Disease 8. H/O Prostate Cancer- 2012 seed implant 9. Hypokalemia.- start KDur 40 meq daily.   Disposition: Home with palliative care. DNR. ICD deactivated. Per primary team home tomorrow.    HF meds Losartan 12.5 mg daily Spiro 12.5 mg daily  Lasix 40 mg twice a day KDUR 40 meq daily   Follow up in HF clinic  2/1 at 1330    Length of Stay: [place], NP  04/06/2016, 8:43 AM  Advanced Heart Failure Team Pager [phone] (M-F; 7a - 4p)  Please contact [facility] for night-coverage after hours (4p -7a ) and weekends on [email or web address]  Patient seen with NP, agree with the above note.  ICD deactivated.  To be going home with palliative care.  Followup in HF clinic arranged.   Loralie Champagne 04/06/2016 1:46 PM

## 2016-04-06 NOTE — Progress Notes (Signed)
Called [employer] to have ICD turned off.  Rep came to the unit and stated that patient does not have an ICD, only a pacemaker.  Dr. Ree Kida aware.

## 2016-04-06 NOTE — Progress Notes (Signed)
CARDIAC REHAB PHASE I   PRE:  Rate/Rhythm: 65 pacing    BP: sitting 87/54    SaO2: 97 2L  MODE:  Ambulation: 40 ft   POST:  Rate/Rhythm: 65    BP: sitting 92/67     SaO2: 97 2L  Pt weak upon standing, legs shaky. Used RW, steadier with distance. Able to walk 40 ft out in hall with RW and gait belt and 2L. No significant SOB. To BSC, VSS although BP on the low side. Discussed daily wts, low sodium, calling MD with sx. Voiced understanding. Gave HF booklet. Pt would benefit from HHPT for strengthening. Has RW and O2 at home.  [place], ACSM 04/06/2016 2:00 PM

## 2016-04-06 NOTE — Care Management Important Message (Signed)
Important Message  Patient Details  Name: [name] MRN: [id number] Date of Birth: [birth year]   Medicare Important Message Given:  Yes    Nathen May 04/06/2016, 2:17 PM

## 2016-04-06 NOTE — Progress Notes (Addendum)
Discharge instructions reviewed with the patient and his daughter including prescriptions, follow up appointments and medication details. Patient's daughter verbalizes understanding of discharge instructions. Patient wheeled off the department in wheelchair on oxygen 2 liters by nursing. Patient's home oxygen tank is empty brought in by family. Nurse will take patient down on our tank and make daughter aware of empty tank.

## 2016-04-06 NOTE — Discharge Summary (Signed)
Physician Discharge Summary  [name] RXV:[id number] DOB: 15-Jul-1932 DOA: 04/02/2016  PCP: [name] Gravel, MD  Admit date: 04/02/2016 Discharge date: 04/06/2016  Time spent: 45 minutes  Recommendations for Outpatient Follow-up:  Patient will be discharged to home.  Patient will need to follow up with primary care provider within one week of discharge, repeat BMP in one week. Follow up with cardiology.  Patient should continue medications as prescribed.  Patient should follow a heart healthy diet with 2L fluid restriction per day.  Discharge Diagnoses:  Acute on chronic respiratory failure Acute on chronic systolic heart failure Acute on chronic COPD exacerbation.  Chronic Afib / bradycardia CAD Hypertension Chronic kidney disease, stage III Chronic Anemia Goals of care  Discharge Condition: Stable  Diet recommendation: heart healthy diet with 2L fluid restriction per day  Filed Weights   04/04/16 0409 04/05/16 0511 04/06/16 0436  Weight: 55.8 kg (123 lb) 52.8 kg (116 lb 8 oz) 52.8 kg (116 lb 4.8 oz)    History of present illness:  on 04/02/2016 by Dr. Regan Rakers E [name] a 83 y.o.malewith medical history significant of CAD, chronic respiratory failure on O2 secondary to advanced COPD, diabetes, CVA, GERD, HLD, HTN, prostate cancer, lung cancer, nephrolithiasis, symptomatic bradycardia status post pacemaker placement. Patient complaining of worsening shortness of breath. Worse than baseline. Patient on 2-9 L nasal cannula at baseline at home. Patient seen in the ED for similar complaint on 03/21/2016 and 03/29/2016. During those visits patient was diagnosed with COPD exacerbation and CHF exacerbation. Symptoms were felt to be mild and okay for outpatient therapies including an increased dose of Lasix. Patient was also placed on prednisone and azithromycin at his last ED visit on the 18th. Patient is complaining of 3 days history of significant worsening of shortness of  breath. Associated with orthopnea, productive cough, chest discomfort with coughing, increasing lower extremity swelling. Denies fevers, palpitations, LOC, neck stiffness, abdominal pain, dysuria, frequency, diarrhea, back pain.  Hospital Course:  Acute on chronic respiratory failure -Uses home oxygen -Likely multifactorial: CHF and COPD exacerbation -Influenza negative  Acute on chronic systolic heart failure -upon admission, BNP >3000 -EF 10-15% -Has [name] CRT-D (per rep, only has pacemaker, not ICD- therefore could not be deactivated. Patient is dependent on pacer) -Cardiology consulted and appreciated -Was placed on IV lasix  -Continue spironolactone -Was given one dose of metolazone -BB held due to concern for low output -monitor intake/output, daily weights -urine output over past 24hrs 1850cc -Paramedicine arranged -Will discharge with cardiology recommendations: Losartan 12.'5mg'$  daily, spironolactone 12.'5mg'$  daily, lasix '40mg'$  BID, Kdur 24mq daily.  Acute on chronic COPD exacerbation.  -Continue oxygen, Tessalon pearl, nebs, levaquin, prednisone -Will discharge with 2 more doses of levaquin and prednisone taper  Chronic Afib / bradycardia -s/p pacemaker placement >12 yrs ago.  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 Feels his legs are still swollen but improved.  Denies chest pain, abdominal pain, N/V/D/C.   Exam  General: Well developed, well nourished, NAD, appears stated age  HEENT: NCAT,  mucous membranes moist.   Cardiovascular: S1 S2 auscultated, RRR, no murmurs  Respiratory: Clear to auscultation   Abdomen: Soft, nontender, nondistended, + bowel sounds  Extremities: warm dry without cyanosis clubbing, trace LE edema. TED hose in place.  Neuro: AAOx3, nonfocal  Psych: Appropriate mood and affect, pleasant  Discharge Instructions Discharge Instructions    (HEART FAILURE PATIENTS) Call MD:  Anytime you have any of the following symptoms: 1) 3 pound weight gain in 24 hours or 5 pounds in 1 week 2) shortness of breath, with or without a dry hacking cough 3) swelling in the hands, feet or stomach 4) if you have to sleep on extra pillows at night in order to breathe.    Complete by:  As directed    AMB Referral to [facility]    Complete by:  As directed    Reason for consult:  Post hospital follow up multiple admissions and ED visits   Diagnoses of:   COPD/ Pneumonia Heart Failure     Expected date of contact:  1-3 days (reserved for hospital discharges)   Please assign to community nurse for transition of care calls and assess for home visits. Questions please call:   [name] Brood, RN BSN [facility] Liaison  [phone] business mobile phone Toll free office [phone]   Discharge instructions    Complete by:  As directed    Patient will be discharged to home.  Patient will need to follow up with primary care provider within one week of discharge, repeat BMP in one week. Follow  up with cardiology.  Patient should continue medications as prescribed.  Patient should follow a heart healthy diet with 2L fluid restriction per day.     Current Discharge Medication List    START taking these medications   Details  levofloxacin (LEVAQUIN) 750 MG tablet Take 1 tablet (750 mg total) by mouth every other day. Qty: 2 tablet, Refills: 0    losartan (COZAAR) 25 MG tablet Take 0.5 tablets (12.5 mg total) by mouth daily. Qty: 30 tablet, Refills: 0    potassium chloride SA (K-DUR,KLOR-CON) 20 MEQ tablet Take 2 tablets (40 mEq total) by mouth daily. Qty: 30 tablet, Refills: 0      CONTINUE these medications which have CHANGED   Details  apixaban (ELIQUIS) 2.5 MG TABS tablet Take 1 tablet (2.5 mg total) by mouth 2 (two) times daily. Qty: 60 tablet, Refills: 0    furosemide (LASIX) 40 MG tablet Take 1 tablet (40 mg total) by mouth 2 (two) times daily. Qty: 60 tablet, Refills: 0    predniSONE (DELTASONE) 20 MG tablet Take 3 tabs x 3 days, then 2 tabs x 3 days, then 1 tabs x 3 days Qty: 18 tablet, Refills: 0    spironolactone (ALDACTONE) 25 MG tablet Take 0.5 tablets (12.5 mg total) by mouth daily. Qty: 30 tablet, Refills: 0      CONTINUE these medications which have NOT CHANGED   Details  acetaminophen (TYLENOL) 500 MG tablet Take  500 mg by mouth every 6 (six) hours as needed (pain).    albuterol (PROVENTIL HFA;VENTOLIN HFA) 108 (90 Base) MCG/ACT inhaler Inhale 2 puffs into the lungs every 4 (four) hours as needed for wheezing or shortness of breath. Qty: 1 Inhaler, Refills: 2    albuterol (PROVENTIL) (2.5 MG/3ML) 0.083% nebulizer solution Take 2.5 mg by nebulization every 6 (six) hours as needed for wheezing or shortness of breath.    allopurinol (ZYLOPRIM) 300 MG tablet Take 300 mg by mouth daily.      CALCIUM-VITAMIN D PO Take 1 tablet by mouth 2 (two) times daily.     ferrous sulfate 325 (65 FE) MG tablet Take 1 tablet (325 mg total) by mouth 2 (two) times daily  with a meal. Qty: 60 tablet, Refills: 0    guaifenesin (ROBITUSSIN) 100 MG/5ML syrup Take 200 mg by mouth 3 (three) times daily as needed for cough or congestion.     hydrocortisone cream 1 % Apply to affected area 2 times daily Qty: 15 g, Refills: 0    nitroGLYCERIN (NITROSTAT) 0.4 MG SL tablet Place 0.4 mg under the tongue every 5 (five) minutes as needed for chest pain.    Omega-3 Fatty Acids (FISH OIL) 1000 MG CAPS Take 1,000 mg by mouth at bedtime.     omeprazole (PRILOSEC) 40 MG capsule Take 1 capsule (40 mg total) by mouth 2 (two) times daily. Qty: 60 capsule, Refills: 1    oxyCODONE (OXY IR/ROXICODONE) 5 MG immediate release tablet Take 1 tablet (5 mg total) by mouth every 4 (four) hours as needed for moderate pain. Qty: 15 tablet, Refills: 0    OXYGEN Inhale 2.5 L into the lungs continuous.    vitamin B-12 (CYANOCOBALAMIN) 1000 MCG tablet Take 1,000 mcg by mouth 2 (two) times daily.    benzonatate (TESSALON) 100 MG capsule Take 1 capsule (100 mg total) by mouth every 8 (eight) hours. Qty: 21 capsule, Refills: 0      STOP taking these medications     isosorbide mononitrate (IMDUR) 30 MG 24 hr tablet      metoprolol succinate (TOPROL-XL) 25 MG 24 hr tablet      azithromycin (ZITHROMAX) 250 MG tablet      doxycycline (VIBRAMYCIN) 100 MG capsule        Allergies  Allergen Reactions  . Cyril [Digoxin] Other (See Comments)    Caused patient to have chest pains and dizzy spells  . Aspirin Hives  . Penicillins Rash    Rash Has patient had a PCN reaction causing immediate rash, facial/tongue/throat swelling, SOB or lightheadedness with hypotension:YES Has patient had a PCN reaction causing severe rash involving mucus membranes or skin necrosis: NO Has patient had a PCN reaction that required hospitalization NO Has patient had a PCN reaction occurring within the last 10 years:NO If all of the above answers are "NO", then may proceed with Cephalosporin use.    Follow-up Information    Glori Bickers, MD Follow up on 04/12/2016.   Specialty:  Cardiology Why:  at 1330. Gar[address] [employer] information: [place] [address] [phone]        [name] Gravel, MD. Schedule an appointment as soon as possible for a visit in 1 week(s).   Specialty:  Internal Medicine Why:  Hospital follow up Contact information: [address] [place] [address] [phone]            The results of significant diagnostics from  this hospitalization (including imaging, microbiology, ancillary and laboratory) are listed below for reference.    Significant Diagnostic Studies: Dg Chest 2 View  Result Date: 04/02/2016 CLINICAL DATA:  Cough, shortness of breath, history of CHF, COPD, and black lung disease. Previous CABG. ICD. EXAM: CHEST  2 VIEW FINDINGS: The lungs are adequately inflated. The interstitial infiltrates observed previously are now more alveolar in nature. The cardiac silhouette remains enlarged. The pulmonary vascularity is more engorged today. There is blunting of the left costophrenic angles. The ICD is in stable position. There is calcification in the wall of the aortic arch. The sternal wires are intact. A unused right ventricular electrode is again demonstrated. There are degenerative changes of both shoulders. There is moderate multilevel degenerative disc disease of the thoracic spine. IMPRESSION: CHF with interstitial and alveolar opacities superimposed upon chronic interstitial changes. Electronically Signed   By: David  Martinique M.D.   On: 04/02/2016 09:58   Dg Chest 2 View  Result Date: 03/29/2016 CLINICAL DATA:  Shortness of breath.  Productive cough for 1 month. EXAM: CHEST  2 VIEW COMPARISON:  March 21, 2016 FINDINGS: No pneumothorax. Stable AICD device. Stable cardiomegaly. Stable interstitial markings in the lungs and small left pleural effusion. No significant interval change.  IMPRESSION: 1. No interval change. Stable small left effusion. Interstitial prominence worsened since March of 2017 but stable since March 21, 2016. I suspect a component of edema. Electronically Signed   By: Dorise Bullion III M.D   On: 03/29/2016 15:28   Dg Chest 2 View  Result Date: 03/21/2016 CLINICAL DATA:  Acute onset of shortness of breath. Initial encounter. EXAM: CHEST  2 VIEW COMPARISON:  Chest radiograph performed 03/11/2016 FINDINGS: The lungs are well-aerated. Small left pleural effusion is noted. Vascular congestion is seen. Increased interstitial markings likely reflect pulmonary edema, more prominent than on the prior study. No pneumothorax is seen. Mild chronic scarring is noted at the right midlung zone. The heart is borderline enlarged. The patient is status post median sternotomy, with evidence of prior CABG. A pacemaker/AICD is noted at the left chest wall, with leads ending at the right atrium, right ventricle and coronary sinus. A right-sided orphaned lead is also noted. No acute osseous abnormalities are seen. IMPRESSION: Small left pleural effusion. Vascular congestion and borderline cardiomegaly. Increased interstitial markings likely reflect pulmonary edema, more prominent than on the prior study. Electronically Signed   By: Garald Balding M.D.   On: 03/21/2016 18:48   Dg Chest 2 View  Result Date: 03/11/2016 CLINICAL DATA:  Chest pain EXAM: CHEST  2 VIEW COMPARISON:  01/16/2016 FINDINGS: LEFT-sided pacemaker with 2 continuous leads overlies stable enlarged cardiac silhouette. There is small LEFT effusion. Lower lobe chronic interstitial pattern with mild interstitial edema. No focal consolidation. No pneumothorax. IMPRESSION: 1. No significant change. 2. Cardiomegaly small LEFT effusion. 3. Chronic interstitial findings in lower lobes with mild interstitial edema. Electronically Signed   By: Suzy Bouchard M.D.   On: 03/11/2016 23:24    Microbiology: Recent Results (from  the past 240 hour(s))  MRSA PCR Screening     Status: None   Collection Time: 04/02/16  5:05 PM  Result Value Ref Range Status   MRSA by PCR NEGATIVE NEGATIVE Final    Comment:        The GeneXpert MRSA Assay (FDA approved for NASAL specimens only), is one component of a comprehensive MRSA colonization surveillance program. It is not intended to diagnose MRSA infection nor to guide or monitor  treatment for MRSA infections.   Culture, expectorated sputum-assessment     Status: None   Collection Time: 04/05/16 10:36 AM  Result Value Ref Range Status   Specimen Description EXPECTORATED SPUTUM  Final   Special Requests NONE  Final   Sputum evaluation   Final    Sputum specimen not acceptable for testing.  Please recollect.   Results Called to: RN Lise Auer [id number] MLM    Report Status 04/05/2016 FINAL  Final     Labs: Basic Metabolic Panel:  Recent Labs Lab 04/02/16 0925 04/03/16 0348 04/04/16 0412 04/05/16 0349 04/06/16 0506  NA 135 135 135 132* 126*  K 4.6 3.6 4.4 3.5 3.3*  CL 97* 96* 90* 83* 76*  CO2 20* 25 31 36* 33*  GLUCOSE 111* 177* 111* 107* 116*  BUN 34* 35* 36* 38* 46*  CREATININE 1.77* 1.70* 1.73* 1.72* 1.95*  CALCIUM 9.4 8.7* 9.3 9.3 8.9   Liver Function Tests: No results for input(s): AST, ALT, ALKPHOS, BILITOT, PROT, ALBUMIN in the last 168 hours. No results for input(s): LIPASE, AMYLASE in the last 168 hours. No results for input(s): AMMONIA in the last 168 hours. CBC:  Recent Labs Lab 04/02/16 0925 04/03/16 0348 04/04/16 0412 04/05/16 0349 04/06/16 0506  WBC 7.9 3.5* 4.5 4.1 2.5*  HGB 9.4* 8.7* 9.7* 10.0* 10.4*  HCT 30.3* 26.3* 30.2* 30.3* 30.6*  MCV 100.7* 97.8 96.8 95.0 93.3  PLT 130* 93* 114* 137* 126*   Cardiac Enzymes: No results for input(s): CKTOTAL, CKMB, CKMBINDEX, TROPONINI in the last 168 hours. BNP: BNP (last 3 results)  Recent Labs  03/21/16 1816 03/29/16 1951 04/02/16 0925  BNP 2,909.3* 2,710.1* 3,007.2*     ProBNP (last 3 results) No results for input(s): PROBNP in the last 8760 hours.  CBG: No results for input(s): GLUCAP in the last 168 hours.     SignedCristal Ford  [facility] 04/06/2016, 11:08 AM

## 2016-04-09 ENCOUNTER — Other Ambulatory Visit: Payer: Self-pay

## 2016-04-09 NOTE — Patient Outreach (Signed)
Transition of care: Discharge date of 04/06/2016.  Patient reports that he continues to cough up mucous. Reports no fever. States that he is using his oxygen at 2.5 liters daily.  Reports that his daughter manages his medications.  Reports that he has not weighed today because he has been in bed most of the day. Reports that this is not unusual for him.    Difficult to get information from patient via phone. I offered home visit for tomorrow to do a complete assessment and patient has agreed.  Patient was unable to review his medications with me because he states that he daughter takes care of his medications.  She was not at home during this call.  Complete assessment to be completed tomorrow am.  [name], RN, BSN, CEN [facility] Coordinator [phone]

## 2016-04-10 ENCOUNTER — Other Ambulatory Visit: Payer: Self-pay

## 2016-04-10 DIAGNOSIS — D649 Anemia, unspecified: Secondary | ICD-10-CM | POA: Diagnosis not present

## 2016-04-10 DIAGNOSIS — I5021 Acute systolic (congestive) heart failure: Secondary | ICD-10-CM | POA: Diagnosis not present

## 2016-04-10 NOTE — Patient Outreach (Signed)
[facility]) Care Management   04/10/2016  LOI RENNAKER 03/19/1932 [id number]  [name] is an 83 y.o. male Arrived for home visit. Both daughter present and engaged in home visit. Patient in hospital bed watching TV.  Cats on bed.  Subjective: Patient reports that he is weak since he got home from hospital. Patient lives with his daughter and states that daughter manages all medications.  Patient reports that he does not weigh daily because he is weak.  States scale in box. Patient reports that before he got sick this last time he was active and able to go outside on a regular basis.  Reports his family is with him on and off all day. States that he is on continuous oxygen.  Reports that his swelling started 1 week prior to admission to hospital. Reports shortness of breath continues to ger worse.  Reviewed printed discharge instructions with patient and daughters.  Patient has follow up primary MD visit this afternoon at 2:30pm.  Reviewed CHF clinic follow up.  Daughter showed me yellow rod DNR out of facility form. I encouraged this to be posted at head of hospital bed.  Daughters provide assistance with ADLS and IADLS including transportation to appointments.  Objective:  Awake and alert.  Appears weak. Is unable to stand on scale without assistance. Able to verbalize his date of birth, day of the week.  Lungs clear. No edema today. Vitals:   04/10/16 1106 04/10/16 1109  BP: 108/60   Pulse: 65   SpO2: 99%   Weight: 115 lb (52.2 kg) 106 lb 1.6 oz (48.1 kg)  Height: 1.524 m (5')    Review of Systems  Constitutional: Positive for malaise/fatigue.  HENT: Negative.   Eyes: Negative.   Respiratory: Positive for cough and sputum production.   Cardiovascular: Negative.        Denies chest pain or shortness of breath since discharge home  Gastrointestinal: Negative.   Genitourinary: Positive for dysuria and frequency.  Musculoskeletal: Negative.   Skin: Negative.    Neurological: Negative.   Endo/Heme/Allergies: Bruises/bleeds easily.  Psychiatric/Behavioral: Negative.     Physical Exam  Constitutional: He is oriented to person, place, and time.  Appears thin  HENT:  No teeth  Cardiovascular: Normal rate and normal heart sounds.   Irregular rhythm  Respiratory: Effort normal and breath sounds normal.  On 2.5 liters nasal canula via oxygen concentrator.  GI: Soft. Bowel sounds are normal.  Musculoskeletal: Normal range of motion. He exhibits no edema.  Able to wiggles toes upon command  Neurological: He is alert and oriented to person, place, and time.  Skin: Skin is warm and dry.  Psychiatric: He has a normal mood and affect. His behavior is normal. Judgment and thought content normal.    Encounter Medications:   Outpatient Encounter Prescriptions as of 04/10/2016  Medication Sig Note  . acetaminophen (TYLENOL) 500 MG tablet Take 500 mg by mouth every 6 (six) hours as needed (pain).   [name] albuterol (PROVENTIL HFA;VENTOLIN HFA) 108 (90 Base) MCG/ACT inhaler Inhale 2 puffs into the lungs every 4 (four) hours as needed for wheezing or shortness of breath.   [name] albuterol (PROVENTIL) (2.5 MG/3ML) 0.083% nebulizer solution Take 2.5 mg by nebulization every 6 (six) hours as needed for wheezing or shortness of breath.   [name] apixaban (ELIQUIS) 2.5 MG TABS tablet Take 1 tablet (2.5 mg total) by mouth 2 (two) times daily.   . benzonatate (TESSALON) 100 MG capsule Take 1  capsule (100 mg total) by mouth every 8 (eight) hours.   [name] CALCIUM-VITAMIN D PO Take 1 tablet by mouth 2 (two) times daily.    . ferrous sulfate 325 (65 FE) MG tablet Take 1 tablet (325 mg total) by mouth 2 (two) times daily with a meal.   . furosemide (LASIX) 40 MG tablet Take 1 tablet (40 mg total) by mouth 2 (two) times daily.   [name] guaifenesin (ROBITUSSIN) 100 MG/5ML syrup Take 200 mg by mouth 3 (three) times daily as needed for cough or congestion.    . hydrocortisone cream 1 % Apply to  affected area 2 times daily (Patient taking differently: Apply 1 application topically 2 (two) times daily as needed for itching. )   . levofloxacin (LEVAQUIN) 750 MG tablet Take 1 tablet (750 mg total) by mouth every other day.   . losartan (COZAAR) 25 MG tablet Take 0.5 tablets (12.5 mg total) by mouth daily.   . nitroGLYCERIN (NITROSTAT) 0.4 MG SL tablet Place 0.4 mg under the tongue every 5 (five) minutes as needed for chest pain. 04/02/2016: The is carried, but hasn't ever been used (to the patient's daughter's knowledge)  . Omega-3 Fatty Acids (FISH OIL) 1000 MG CAPS Take 1,000 mg by mouth at bedtime.    [name] omeprazole (PRILOSEC) 40 MG capsule Take 1 capsule (40 mg total) by mouth 2 (two) times daily.   . OXYGEN Inhale 2.5 L into the lungs continuous.   . potassium chloride SA (K-DUR,KLOR-CON) 20 MEQ tablet Take 2 tablets (40 mEq total) by mouth daily.   . predniSONE (DELTASONE) 20 MG tablet Take 3 tabs x 3 days, then 2 tabs x 3 days, then 1 tabs x 3 days   . spironolactone (ALDACTONE) 25 MG tablet Take 0.5 tablets (12.5 mg total) by mouth daily.   . vitamin B-12 (CYANOCOBALAMIN) 1000 MCG tablet Take 1,000 mcg by mouth 2 (two) times daily.   [name] allopurinol (ZYLOPRIM) 300 MG tablet Take 300 mg by mouth daily.     [name] oxyCODONE (OXY IR/ROXICODONE) 5 MG immediate release tablet Take 1 tablet (5 mg total) by mouth every 4 (four) hours as needed for moderate pain. (Patient not taking: Reported on 04/10/2016)    No facility-administered encounter medications on file as of 04/10/2016.     Functional Status:   In your present state of health, do you have any difficulty performing the following activities: 04/10/2016 04/02/2016  Hearing? N N  Vision? N N  Difficulty concentrating or making decisions? N N  Walking or climbing stairs? N Y  Dressing or bathing? N N  Doing errands, shopping? Y N  Preparing Food and eating ? N -  Using the Toilet? Y -  In the past six months, have you accidently leaked  urine? N -  Do you have problems with loss of bowel control? N -  Managing your Medications? Y -  Managing your Finances? N -  Housekeeping or managing your Housekeeping? Y -  Some recent data might be hidden    Fall/Depression Screening:    PHQ 2/9 Scores 04/10/2016  PHQ - 2 Score 0   Fall Risk  04/10/2016  Falls in the past year? Yes  Number falls in past yr: 2 or more  Injury with Fall? Yes  Risk Factor Category  High Fall Risk  Risk for fall due to : History of fall(s)  Follow up Falls prevention discussed   Assessment:   (1) reviewed [facility] consent.  Daughters names wrong  on original consent. New consent obtained. Daughters able to produce [facility] folder.  [name] on refrigerator. Provided my contact card. Reviewed transition of care program. (2) has out of facility DNR.  (3) difficulty weighing daily.   (4) follow up appointments reviewed with patient and family. (5) fall risk.  Patient interested in wheelchair.  Uses walker as needed.  (6) has all medications and daughters understand how to give medications.  (7) daughter reports nebulizer machine is old and oxygen concentrator needs to be services.   (8) reports following low salt diet however patient ate bacon for breakfast  Plan:  (1) new consent scanned into chart. Patient requested I contact his daughter Lucita Ferrara for updates weekly. (2) encouraged daughter to posted DNR form above head of bed. She states that she will.  (3) Reviewed importance of daily weights. Reviewed CHF zones and when to call MD. Provided EMMI education on heart failure. Provided low salt tear off page about CHF.  Provided Living with heart failure packet from hospital.  Reviewed [facility] calendar and where to record weights. Assisted patient with use of tele monitoring scale and tablet. (4)Encouraged patient and daughters to keep all pending appointments including today's follow up with primary MD. Reviewed discharge instructions and request  For labs as  outpatient. (5) Encouraged patient to ask for a prescription for wheelchair and to have RX sent to home health agency of choice. Daughter agreed to ask for RX today. Encouraged patient to sit on the side of the bed before standing to gain balance. Encouraged non slip socks and shoes. Reviewed importance of keeping muscle strength in legs.  (6) Encouraged patient to continue to take all medications as prescribed. (7) Encouraged daughter to discuss needs for neb medication and new machine with MD at today's office visit. (8) Reviewed importance of low salt diet and encouraged patient and daughters to follow low salt diet.   Goal setting and care planning during home visit and primary goal is avoid readmission. Next follow up via phone in 7 days.  [facility] CM Care Plan Problem One   Flowsheet Row Most Recent Value  Care Plan Problem One  Recent admission with CHF excerbation.   Role Documenting the Problem One  Care Management [place] for Problem One  Active  [facility] Long Term Goal (31-90 days)  Patient will report no readmissions related to heart failure in the next 60 days.  [facility] Long Term Goal Start Date  04/03/16  Interventions for Problem One Long Term Goal  Home visit completed.  Reviewed discharge instructions and medications.  Encouraged timely follow up with MD's.  [facility] CM Short Term Goal #1 (0-30 days)  Patient and or daughter will request prescriptions for neb medications, wheelchair and services to current nebulizer and concentrator in the next 2 weeks.  [facility] CM Short Term Goal #1 Start Date  04/10/16  Interventions for Short Term Goal #1  Encouraged daughters to convey their needs for patient at today primary MD visit.  Requested daughter to let me know if she needs assistance with this task.  [facility] CM Short Term Goal #2 (0-30 days)  Patient will weigh daily and record weights in [facility] calendar to take to MD office for the next 30 days.  [facility] CM Short Term Goal #2 Start Date  04/10/16   Interventions for Short Term Goal #2  Reviewed CHF zones. reviewed importancce of daily weights first thing every day after emptying bladder. Reviewed when to call MD.  Will send this note to MD with barrier letter.  [name], RN, BSN, CEN [facility] [employer] [phone]

## 2016-04-12 ENCOUNTER — Inpatient Hospital Stay (HOSPITAL_COMMUNITY): Admit: 2016-04-12 | Payer: Medicare Other

## 2016-04-13 DIAGNOSIS — R2681 Unsteadiness on feet: Secondary | ICD-10-CM | POA: Diagnosis not present

## 2016-04-13 DIAGNOSIS — Z86718 Personal history of other venous thrombosis and embolism: Secondary | ICD-10-CM | POA: Diagnosis not present

## 2016-04-13 DIAGNOSIS — I2510 Atherosclerotic heart disease of native coronary artery without angina pectoris: Secondary | ICD-10-CM | POA: Diagnosis not present

## 2016-04-13 DIAGNOSIS — J849 Interstitial pulmonary disease, unspecified: Secondary | ICD-10-CM | POA: Diagnosis not present

## 2016-04-13 DIAGNOSIS — E119 Type 2 diabetes mellitus without complications: Secondary | ICD-10-CM | POA: Diagnosis not present

## 2016-04-13 DIAGNOSIS — D649 Anemia, unspecified: Secondary | ICD-10-CM | POA: Diagnosis not present

## 2016-04-13 DIAGNOSIS — Z923 Personal history of irradiation: Secondary | ICD-10-CM | POA: Diagnosis not present

## 2016-04-13 DIAGNOSIS — I110 Hypertensive heart disease with heart failure: Secondary | ICD-10-CM | POA: Diagnosis not present

## 2016-04-13 DIAGNOSIS — Z85118 Personal history of other malignant neoplasm of bronchus and lung: Secondary | ICD-10-CM | POA: Diagnosis not present

## 2016-04-13 DIAGNOSIS — I5021 Acute systolic (congestive) heart failure: Secondary | ICD-10-CM | POA: Diagnosis not present

## 2016-04-13 DIAGNOSIS — I480 Paroxysmal atrial fibrillation: Secondary | ICD-10-CM | POA: Diagnosis not present

## 2016-04-13 DIAGNOSIS — J449 Chronic obstructive pulmonary disease, unspecified: Secondary | ICD-10-CM | POA: Diagnosis not present

## 2016-04-14 ENCOUNTER — Encounter (HOSPITAL_COMMUNITY): Payer: Medicare Other

## 2016-04-17 ENCOUNTER — Other Ambulatory Visit: Payer: Self-pay

## 2016-04-17 DIAGNOSIS — Z85118 Personal history of other malignant neoplasm of bronchus and lung: Secondary | ICD-10-CM | POA: Diagnosis not present

## 2016-04-17 DIAGNOSIS — I110 Hypertensive heart disease with heart failure: Secondary | ICD-10-CM | POA: Diagnosis not present

## 2016-04-17 DIAGNOSIS — Z86718 Personal history of other venous thrombosis and embolism: Secondary | ICD-10-CM | POA: Diagnosis not present

## 2016-04-17 DIAGNOSIS — E119 Type 2 diabetes mellitus without complications: Secondary | ICD-10-CM | POA: Diagnosis not present

## 2016-04-17 DIAGNOSIS — Z923 Personal history of irradiation: Secondary | ICD-10-CM | POA: Diagnosis not present

## 2016-04-17 DIAGNOSIS — I2510 Atherosclerotic heart disease of native coronary artery without angina pectoris: Secondary | ICD-10-CM | POA: Diagnosis not present

## 2016-04-17 DIAGNOSIS — I5021 Acute systolic (congestive) heart failure: Secondary | ICD-10-CM | POA: Diagnosis not present

## 2016-04-17 DIAGNOSIS — I480 Paroxysmal atrial fibrillation: Secondary | ICD-10-CM | POA: Diagnosis not present

## 2016-04-17 DIAGNOSIS — J849 Interstitial pulmonary disease, unspecified: Secondary | ICD-10-CM | POA: Diagnosis not present

## 2016-04-17 DIAGNOSIS — R2681 Unsteadiness on feet: Secondary | ICD-10-CM | POA: Diagnosis not present

## 2016-04-17 DIAGNOSIS — D649 Anemia, unspecified: Secondary | ICD-10-CM | POA: Diagnosis not present

## 2016-04-17 DIAGNOSIS — J449 Chronic obstructive pulmonary disease, unspecified: Secondary | ICD-10-CM | POA: Diagnosis not present

## 2016-04-17 NOTE — Patient Outreach (Signed)
Transition of care call: Placed call to patient and spoke with daughter [name].  Ms. Smiddy reports that patient is doing well. Reports that patient is able to walk some in the home. Reports that patient is weighing daily but she does not remember what last weight was. Reports no increase in swelling, no increase in shortness of breath.  Daughter reports that patient went to follow up with primary MD but did not follow up with cardiology. Daughter states that patients appointment was rescheduled but she does not remember when patient will be seen.  Reports no changes in medications after MD appointment. Reports that she has RX for wheelchair and will working on getting this equipment. Declines needing assistance with this.   Denies any new problems or concerns. PLAN: Will continue weekly transition of care calls.  [facility] CM Care Plan Problem One   Flowsheet Row Most Recent Value  Care Plan Problem One  Recent admission with CHF excerbation.   Role Documenting the Problem One  Care Management Coordinator  Care Plan for Problem One  Active  [facility] Long Term Goal (31-90 days)  Patient will report no readmissions related to heart failure in the next 60 days.  [facility] Long Term Goal Start Date  04/03/16  Interventions for Problem One Long Term Goal  Encouraged patient to weigh daily and call MD for weight gain.  [facility] CM Short Term Goal #1 (0-30 days)  Patient and or daughter will request prescriptions for neb medications, wheelchair and services to current nebulizer and concentrator in the next 2 weeks.  [facility] CM Short Term Goal #1 Start Date  04/10/16  [facility] CM Short Term Goal #1 Met Date  04/17/16  Interventions for Short Term Goal #1  Encouraged daughters to convey their needs for patient at today primary MD visit.  Requested daughter to let me know if she needs assistance with this task.  [facility] CM Short Term Goal #2 (0-30 days)  Patient will weigh daily and record weights in [facility] calendar to take to MD office for  the next 30 days.  [facility] CM Short Term Goal #2 Start Date  04/10/16  Interventions for Short Term Goal #2  Again encouraged daily weights . Reviewed when to call MD. Reminded daughter of importance of follow up with cardiology.      [name] Cook, RN, BSN, CEN [facility] Community Care Coordinator [phone]   

## 2016-04-18 DIAGNOSIS — Z923 Personal history of irradiation: Secondary | ICD-10-CM | POA: Diagnosis not present

## 2016-04-18 DIAGNOSIS — Z86718 Personal history of other venous thrombosis and embolism: Secondary | ICD-10-CM | POA: Diagnosis not present

## 2016-04-18 DIAGNOSIS — J849 Interstitial pulmonary disease, unspecified: Secondary | ICD-10-CM | POA: Diagnosis not present

## 2016-04-18 DIAGNOSIS — I2510 Atherosclerotic heart disease of native coronary artery without angina pectoris: Secondary | ICD-10-CM | POA: Diagnosis not present

## 2016-04-18 DIAGNOSIS — I480 Paroxysmal atrial fibrillation: Secondary | ICD-10-CM | POA: Diagnosis not present

## 2016-04-18 DIAGNOSIS — R2681 Unsteadiness on feet: Secondary | ICD-10-CM | POA: Diagnosis not present

## 2016-04-18 DIAGNOSIS — Z85118 Personal history of other malignant neoplasm of bronchus and lung: Secondary | ICD-10-CM | POA: Diagnosis not present

## 2016-04-18 DIAGNOSIS — J449 Chronic obstructive pulmonary disease, unspecified: Secondary | ICD-10-CM | POA: Diagnosis not present

## 2016-04-18 DIAGNOSIS — I5021 Acute systolic (congestive) heart failure: Secondary | ICD-10-CM | POA: Diagnosis not present

## 2016-04-18 DIAGNOSIS — I110 Hypertensive heart disease with heart failure: Secondary | ICD-10-CM | POA: Diagnosis not present

## 2016-04-18 DIAGNOSIS — E119 Type 2 diabetes mellitus without complications: Secondary | ICD-10-CM | POA: Diagnosis not present

## 2016-04-18 DIAGNOSIS — D649 Anemia, unspecified: Secondary | ICD-10-CM | POA: Diagnosis not present

## 2016-04-22 DIAGNOSIS — J449 Chronic obstructive pulmonary disease, unspecified: Secondary | ICD-10-CM | POA: Diagnosis not present

## 2016-04-22 NOTE — Progress Notes (Signed)
Cardiology Office Note   Date:  04/23/2016   ID:  [name], DOB 1932/04/15, MRN [id number]  PCP:  [name], MD  Cardiologist:   Minus Breeding, MD    Chief Complaint  Patient presents with  . Cardiomyopathy      History of Present Illness: [name] is a 83 y.o. male who presents for follow up of systolic HF (EF 84%)  and CAD status post CABG, atrial fib and history of CRT.  He was in Jan for respiratory failure.  He had exacerbation of COPD and CHF.  Of note during his recent hospitalization he did have a palliative care consult and was made a DNR.   He has done relatively OK.  At home he gets around with a walker and holds onto things.  Today he is very somnolent. His blood pressure is low. He's not weighing himself every day. He's had some physical therapy. There's been only sporadic nurses coming to the house. He drinks about 40 ounces of water and also coffee at home. He's not had any acute shortness of breath, PND or orthopnea. He's not had any palpitations, presyncope or syncope. He's not had any chest pressure, neck or arm discomfort.  He wears oxygen but actually didn't bring it today.   Past Medical History:  Diagnosis Date  . Arthritis   . Black lung disease ([place])   . CAD (coronary artery disease)    a. 07/2002 CABG x 3: LIMA->LAD, VG->Diag, VG->OM;  b. 06/2006 Cath: LM 50-60ost/p, LAD patent mid stent, D1 sev dzs, D2 patent stent, LCX nl, OM2 sev sten prox, RCA large/nl, VG->Diag nl, VG->OM nl, LIMA->LAD atretic, EF 30%.  . Carotid artery occlusion   . Chronic atrial fibrillation ([place])   . Chronic respiratory failure ([place])   . Chronic systolic CHF (congestive heart failure) ([place])    a. 12/2012 Echo: EF 20-25%, mid-dist antsept AK, mod dil LA.  [name] COPD (chronic obstructive pulmonary disease) ([place])    a. On home O2.  [name] CVA (cerebral vascular accident) ([place])   . Diabetes mellitus   . GERD (gastroesophageal reflux disease)   . Gout   . Hypercholesterolemia     . Hypertension   . Ischemic cardiomyopathy    a. 06/2006  BIV-ICD -> gen change 2015 to [place].  . Lung cancer ([employer])   . Nephrolithiasis 09/2000  . On home oxygen therapy    "2.5L; 24/7" (01/16/2016)  . Prostate cancer ([place]) 11/01/10   gleason 7, 8, 9, gold seeds 02/08/11  . Symptomatic bradycardia     Past Surgical History:  Procedure Laterality Date  . BI-VENTRICULAR PACEMAKER INSERTION (CRT-P)  12-02-13   downgrade of previously implanted CRTD to STJ CRTP  . BIV PACEMAKER GENERATOR CHANGE OUT N/A 12/02/2013   Procedure: BIV PACEMAKER GENERATOR CHANGE OUT;  Surgeon: Evans Lance, MD;  Location: [facility] CATH LAB;  Service: Cardiovascular;  Laterality: N/A;  . COLONOSCOPY    . CORONARY ANGIOPLASTY WITH STENT PLACEMENT  07/1997; 08/1997;03/1998  . CORONARY ARTERY BYPASS GRAFT  07/2002   CABG X3  . ESOPHAGOGASTRODUODENOSCOPY  02/19/2011   Procedure: ESOPHAGOGASTRODUODENOSCOPY (EGD);  Surgeon: Jeryl Columbia, MD;  Location: [facility] ENDOSCOPY;  Service: Endoscopy;  Laterality: N/A;  . FEMORAL-POPLITEAL BYPASS GRAFT Right 01/20/2016   Procedure: THROMBECTOMY OF POPLITEAL ARTERY AND ANTERIOR TIBIAL ARTERY RIGHT LEG; INTRAOPERATIVE ARTERIOGRAM;  Surgeon: Waynetta Sandy, MD;  Location: [place];  Service: Vascular;  Laterality: Right;  . INCISION AND DRAINAGE  OF WOUND  08/2002   right thigh; S/P EVH  . INSERT / REPLACE / [place]; 1992; 01/2000;  . INSERT / REPLACE / REMOVE PACEMAKER  09/2003; 06/2006   w/AICD  . INSERT / REPLACE / REMOVE PACEMAKER  12/2004   pacmaker explant  . SHOULDER ARTHROSCOPY W/ ROTATOR CUFF REPAIR  05/2008   left  . upper endoscpopy       Current Outpatient Prescriptions  Medication Sig Dispense Refill  . acetaminophen (TYLENOL) 500 MG tablet Take 500 mg by mouth every 6 (six) hours as needed (pain).    [name] albuterol (PROVENTIL HFA;VENTOLIN HFA) 108 (90 Base) MCG/ACT inhaler Inhale 2 puffs into the lungs every 4 (four) hours as needed for wheezing or  shortness of breath. 1 Inhaler 2  . albuterol (PROVENTIL) (2.5 MG/3ML) 0.083% nebulizer solution Take 2.5 mg by nebulization every 6 (six) hours as needed for wheezing or shortness of breath.    . allopurinol (ZYLOPRIM) 300 MG tablet Take 300 mg by mouth daily.      . benzonatate (TESSALON) 100 MG capsule Take 1 capsule (100 mg total) by mouth every 8 (eight) hours. 21 capsule 0  . CALCIUM-VITAMIN D PO Take 1 tablet by mouth 2 (two) times daily.     [name] ELIQUIS 2.5 MG TABS tablet Take 1 tablet by mouth 2 (two) times daily.    . ferrous sulfate 325 (65 FE) MG tablet Take 1 tablet (325 mg total) by mouth 2 (two) times daily with a meal. 60 tablet 0  . furosemide (LASIX) 40 MG tablet Take 1 tablet (40 mg total) by mouth 2 (two) times daily. 60 tablet 0  . guaifenesin (ROBITUSSIN) 100 MG/5ML syrup Take 200 mg by mouth 3 (three) times daily as needed for cough or congestion.     . hydrocortisone cream 1 % Apply to affected area 2 times daily (Patient taking differently: Apply 1 application topically 2 (two) times daily as needed for itching. ) 15 g 0  . levofloxacin (LEVAQUIN) 750 MG tablet Take 1 tablet (750 mg total) by mouth every other day. 2 tablet 0  . nitroGLYCERIN (NITROSTAT) 0.4 MG SL tablet Place 0.4 mg under the tongue every 5 (five) minutes as needed for chest pain.    . Omega-3 Fatty Acids (FISH OIL) 1000 MG CAPS Take 1,000 mg by mouth at bedtime.     [name] omeprazole (PRILOSEC) 40 MG capsule Take 1 capsule (40 mg total) by mouth 2 (two) times daily. 60 capsule 1  . oxyCODONE (OXY IR/ROXICODONE) 5 MG immediate release tablet Take 1 tablet (5 mg total) by mouth every 4 (four) hours as needed for moderate pain. 15 tablet 0  . OXYGEN Inhale 2.5 L into the lungs continuous.    . potassium chloride SA (K-DUR,KLOR-CON) 20 MEQ tablet Take 2 tablets (40 mEq total) by mouth daily. 30 tablet 0  . predniSONE (DELTASONE) 20 MG tablet Take 3 tabs x 3 days, then 2 tabs x 3 days, then 1 tabs x 3 days 18 tablet 0   . vitamin B-12 (CYANOCOBALAMIN) 1000 MCG tablet Take 1,000 mcg by mouth 2 (two) times daily.     No current facility-administered medications for this visit.     Allergies:   [place] [digoxin]; Aspirin; and Penicillins    ROS:  Please see the history of present illness.   Otherwise, review of systems are positive for none.   All other systems are reviewed and negative.    PHYSICAL EXAM: VS:  BP (!) 92/49   Pulse 60   Ht '5\' 6"'$  (1.676 m)   Wt 112 lb (50.8 kg)   SpO2 98%   BMI 18.08 kg/m  , BMI Body mass index is 18.08 kg/m. GEN:  No distress, very frail NECK:  No jugular venous distention at 90 degrees, waveform within normal limits, carotid upstroke brisk and symmetric, no bruits, no thyromegaly LYMPHATICS:  No cervical adenopathy LUNGS:  Clear to auscultation bilaterally BACK:  No CVA tenderness CHEST:  Unremarkable HEART:  S1 and S2 within normal limits, no S3, no S4, no clicks, no rubs, no murmurs ABD:  Positive bowel sounds normal in frequency in pitch, no bruits, no rebound, no guarding, unable to assess midline mass or bruit with the patient seated. EXT:  2 plus pulses throughout, moderate edema, no cyanosis no clubbing SKIN:  No rashes no nodules NEURO:  Cranial nerves II through XII grossly intact, motor grossly intact throughout PSYCH:  Cognitively intact, oriented to person place and time   EKG:  EKG is not ordered today.   Recent Labs: 10/27/2015: TSH 4.655 01/19/2016: Magnesium 2.2 01/22/2016: ALT 20 04/02/2016: B Natriuretic Peptide 3,007.2 04/06/2016: BUN 46; Creatinine, Ser 1.95; Hemoglobin 10.4; Platelets 126; Potassium 3.3; Sodium 126    Lipid Panel    Component Value Date/Time   CHOL 159 10/27/2015 0515   TRIG 245 (H) 10/27/2015 0515   HDL 18 (L) 10/27/2015 0515   CHOLHDL 8.8 10/27/2015 0515   VLDL 49 (H) 10/27/2015 0515   LDLCALC 92 10/27/2015 0515      Wt Readings from Last 3 Encounters:  04/23/16 112 lb (50.8 kg)  04/10/16 106 lb 1.6 oz  (48.1 kg)  04/06/16 116 lb 4.8 oz (52.8 kg)      Other studies Reviewed: Additional studies/ records that were reviewed today include:   Hopsital records.  Review of the above records demonstrates:  Please see elsewhere in the note.     ASSESSMENT AND PLAN:  ACUTE ON CHRONIC SYSTOLIC AND DIASTOLIC HF:   I had a discussion with his daughter and I'm going to get hospice nurses into the house. He was very hypotensive today. His blood pressure dropped into the 70s and he became somnolent while I was talking to him. I laid him down for his feet up. He responded quickly blood pressure came up to 92. His oxygen saturation was fine. He was never in distress. We will stop his spironolactone and Cozaar for now but unlikely to restart these in the future. However, he needs frequent nursing visits and blood pressure checks and it would be nice to have some home blood work provide him the opportunity to stay out all the hospital.  CKD:   I will check a basic metabolic profile today.  HTN:  This is being managed in the context of treating his CHF  HYPONATREMIA:    He has had hyponatremia. I will check a basic metabolic profile today.   Current medicines are reviewed at length with the patient today.  The patient does not have concerns regarding medicines.  The following changes have been made:  no change  Labs/ tests ordered today include:   Orders Placed This Encounter  Procedures  . Basic Metabolic Panel (BMET)  . Ambulatory referral to Hospice     Disposition:   FU with me in 3 months.     Signed, Minus Breeding, MD  04/23/2016 9:44 PM    [facility]

## 2016-04-23 ENCOUNTER — Encounter: Payer: Self-pay | Admitting: Cardiology

## 2016-04-23 ENCOUNTER — Ambulatory Visit (INDEPENDENT_AMBULATORY_CARE_PROVIDER_SITE_OTHER): Payer: Medicare Other | Admitting: Cardiology

## 2016-04-23 ENCOUNTER — Telehealth: Payer: Self-pay | Admitting: Internal Medicine

## 2016-04-23 VITALS — BP 92/49 | HR 60 | Ht 66.0 in | Wt 112.0 lb

## 2016-04-23 DIAGNOSIS — J449 Chronic obstructive pulmonary disease, unspecified: Secondary | ICD-10-CM | POA: Insufficient documentation

## 2016-04-23 DIAGNOSIS — Z66 Do not resuscitate: Secondary | ICD-10-CM | POA: Diagnosis not present

## 2016-04-23 DIAGNOSIS — Z9981 Dependence on supplemental oxygen: Secondary | ICD-10-CM | POA: Insufficient documentation

## 2016-04-23 DIAGNOSIS — E1122 Type 2 diabetes mellitus with diabetic chronic kidney disease: Secondary | ICD-10-CM | POA: Diagnosis not present

## 2016-04-23 DIAGNOSIS — I5042 Chronic combined systolic (congestive) and diastolic (congestive) heart failure: Secondary | ICD-10-CM | POA: Insufficient documentation

## 2016-04-23 DIAGNOSIS — I9589 Other hypotension: Secondary | ICD-10-CM | POA: Diagnosis not present

## 2016-04-23 DIAGNOSIS — J60 Coalworker's pneumoconiosis: Secondary | ICD-10-CM | POA: Insufficient documentation

## 2016-04-23 DIAGNOSIS — I255 Ischemic cardiomyopathy: Secondary | ICD-10-CM | POA: Diagnosis not present

## 2016-04-23 DIAGNOSIS — Z951 Presence of aortocoronary bypass graft: Secondary | ICD-10-CM | POA: Insufficient documentation

## 2016-04-23 DIAGNOSIS — N183 Chronic kidney disease, stage 3 (moderate): Secondary | ICD-10-CM | POA: Insufficient documentation

## 2016-04-23 DIAGNOSIS — I6529 Occlusion and stenosis of unspecified carotid artery: Secondary | ICD-10-CM | POA: Diagnosis not present

## 2016-04-23 DIAGNOSIS — I482 Chronic atrial fibrillation: Secondary | ICD-10-CM | POA: Insufficient documentation

## 2016-04-23 DIAGNOSIS — I130 Hypertensive heart and chronic kidney disease with heart failure and stage 1 through stage 4 chronic kidney disease, or unspecified chronic kidney disease: Secondary | ICD-10-CM | POA: Diagnosis not present

## 2016-04-23 DIAGNOSIS — Z8673 Personal history of transient ischemic attack (TIA), and cerebral infarction without residual deficits: Secondary | ICD-10-CM | POA: Insufficient documentation

## 2016-04-23 DIAGNOSIS — Z87891 Personal history of nicotine dependence: Secondary | ICD-10-CM | POA: Insufficient documentation

## 2016-04-23 DIAGNOSIS — K219 Gastro-esophageal reflux disease without esophagitis: Secondary | ICD-10-CM | POA: Insufficient documentation

## 2016-04-23 DIAGNOSIS — Z7901 Long term (current) use of anticoagulants: Secondary | ICD-10-CM | POA: Insufficient documentation

## 2016-04-23 DIAGNOSIS — E7800 Pure hypercholesterolemia, unspecified: Secondary | ICD-10-CM | POA: Insufficient documentation

## 2016-04-23 DIAGNOSIS — M109 Gout, unspecified: Secondary | ICD-10-CM | POA: Insufficient documentation

## 2016-04-23 DIAGNOSIS — Z833 Family history of diabetes mellitus: Secondary | ICD-10-CM | POA: Insufficient documentation

## 2016-04-23 DIAGNOSIS — M1990 Unspecified osteoarthritis, unspecified site: Secondary | ICD-10-CM | POA: Insufficient documentation

## 2016-04-23 DIAGNOSIS — I2510 Atherosclerotic heart disease of native coronary artery without angina pectoris: Secondary | ICD-10-CM | POA: Insufficient documentation

## 2016-04-23 DIAGNOSIS — I5043 Acute on chronic combined systolic (congestive) and diastolic (congestive) heart failure: Secondary | ICD-10-CM

## 2016-04-23 DIAGNOSIS — D631 Anemia in chronic kidney disease: Secondary | ICD-10-CM | POA: Diagnosis not present

## 2016-04-23 DIAGNOSIS — Z7952 Long term (current) use of systemic steroids: Secondary | ICD-10-CM | POA: Insufficient documentation

## 2016-04-23 DIAGNOSIS — J9610 Chronic respiratory failure, unspecified whether with hypoxia or hypercapnia: Secondary | ICD-10-CM | POA: Insufficient documentation

## 2016-04-23 DIAGNOSIS — D696 Thrombocytopenia, unspecified: Secondary | ICD-10-CM | POA: Diagnosis not present

## 2016-04-23 DIAGNOSIS — Z880 Allergy status to penicillin: Secondary | ICD-10-CM | POA: Insufficient documentation

## 2016-04-23 DIAGNOSIS — Z95810 Presence of automatic (implantable) cardiac defibrillator: Secondary | ICD-10-CM | POA: Diagnosis not present

## 2016-04-23 DIAGNOSIS — E875 Hyperkalemia: Principal | ICD-10-CM | POA: Insufficient documentation

## 2016-04-23 DIAGNOSIS — Z79899 Other long term (current) drug therapy: Secondary | ICD-10-CM | POA: Diagnosis not present

## 2016-04-23 DIAGNOSIS — I252 Old myocardial infarction: Secondary | ICD-10-CM | POA: Diagnosis not present

## 2016-04-23 LAB — BASIC METABOLIC PANEL
BUN: 46 mg/dL — ABNORMAL HIGH (ref 7–25)
CALCIUM: 9.3 mg/dL (ref 8.6–10.3)
CO2: 24 mmol/L (ref 20–31)
CREATININE: 1.83 mg/dL — AB (ref 0.70–1.11)
Chloride: 92 mmol/L — ABNORMAL LOW (ref 98–110)
Glucose, Bld: 86 mg/dL (ref 65–99)
Potassium: 6.4 mmol/L (ref 3.5–5.3)
SODIUM: 129 mmol/L — AB (ref 135–146)

## 2016-04-23 NOTE — Patient Instructions (Addendum)
Medication Instructions:  STOP- Spironolactone and Losartan  Labwork: BMP  Testing/Procedures: None Ordered  Follow-Up:  You have been referred to Hospice  Your physician recommends that you schedule a follow-up appointment in: 3 Months   Any Other Special Instructions Will Be Listed Below (If Applicable).   If you need a refill on your cardiac medications before your next appointment, please call your pharmacy.

## 2016-04-23 NOTE — Telephone Encounter (Signed)
Received a call from the laboratory regarding patient's BMP that was drawn today. K came back at 6.4. Patient has been taking supplemental potassium as prescribed. No complaints of syncope, palpitations, defibrillator shocks. Lab confirmed that specimen was checked twice and that there was no evidence of hemolysis.   Spoke with patient's daughter who helps take care of him. She was in agreement to bring him to the ED for treatment. Understands that he may not need to be admitted, but rather monitored/treated overnight tonight.   Will cc patient's cardiologist.  Lanna Poche, MD

## 2016-04-24 ENCOUNTER — Observation Stay (HOSPITAL_COMMUNITY)
Admission: EM | Admit: 2016-04-24 | Discharge: 2016-04-25 | Disposition: A | Discharge status: Home / Self Care | Payer: Medicare Other | Attending: Internal Medicine | Admitting: Internal Medicine

## 2016-04-24 ENCOUNTER — Other Ambulatory Visit: Payer: Self-pay

## 2016-04-24 ENCOUNTER — Encounter (HOSPITAL_COMMUNITY): Payer: Self-pay

## 2016-04-24 ENCOUNTER — Ambulatory Visit: Payer: Self-pay

## 2016-04-24 DIAGNOSIS — J449 Chronic obstructive pulmonary disease, unspecified: Secondary | ICD-10-CM | POA: Diagnosis not present

## 2016-04-24 DIAGNOSIS — N183 Chronic kidney disease, stage 3 unspecified: Secondary | ICD-10-CM | POA: Diagnosis present

## 2016-04-24 DIAGNOSIS — D631 Anemia in chronic kidney disease: Secondary | ICD-10-CM

## 2016-04-24 DIAGNOSIS — J9611 Chronic respiratory failure with hypoxia: Secondary | ICD-10-CM | POA: Diagnosis present

## 2016-04-24 DIAGNOSIS — E875 Hyperkalemia: Secondary | ICD-10-CM | POA: Diagnosis not present

## 2016-04-24 DIAGNOSIS — D649 Anemia, unspecified: Secondary | ICD-10-CM | POA: Diagnosis present

## 2016-04-24 DIAGNOSIS — Z950 Presence of cardiac pacemaker: Secondary | ICD-10-CM

## 2016-04-24 DIAGNOSIS — I951 Orthostatic hypotension: Secondary | ICD-10-CM | POA: Diagnosis present

## 2016-04-24 DIAGNOSIS — I5022 Chronic systolic (congestive) heart failure: Secondary | ICD-10-CM | POA: Diagnosis present

## 2016-04-24 DIAGNOSIS — Z95810 Presence of automatic (implantable) cardiac defibrillator: Secondary | ICD-10-CM | POA: Diagnosis not present

## 2016-04-24 DIAGNOSIS — I5021 Acute systolic (congestive) heart failure: Secondary | ICD-10-CM | POA: Diagnosis not present

## 2016-04-24 DIAGNOSIS — I959 Hypotension, unspecified: Secondary | ICD-10-CM

## 2016-04-24 LAB — BASIC METABOLIC PANEL
Anion gap: 13 (ref 5–15)
Anion gap: 14 (ref 5–15)
BUN: 45 mg/dL — ABNORMAL HIGH (ref 6–20)
BUN: 42 mg/dL — ABNORMAL HIGH (ref 6–20)
CALCIUM: 9.6 mg/dL (ref 8.9–10.3)
CO2: 25 mmol/L (ref 22–32)
CO2: 27 mmol/L (ref 22–32)
CREATININE: 1.83 mg/dL — AB (ref 0.61–1.24)
CREATININE: 1.92 mg/dL — AB (ref 0.61–1.24)
Calcium: 10.7 mg/dL — ABNORMAL HIGH (ref 8.9–10.3)
Chloride: 92 mmol/L — ABNORMAL LOW (ref 101–111)
Chloride: 92 mmol/L — ABNORMAL LOW (ref 101–111)
GFR calc Af Amer: 36 mL/min — ABNORMAL LOW (ref >60)
GFR calc non Af Amer: 31 mL/min — ABNORMAL LOW (ref >60)
GFR calc non Af Amer: 33 mL/min — ABNORMAL LOW (ref >60)
GFR, EST AFRICAN AMERICAN: 38 mL/min — AB (ref >60)
GLUCOSE: 94 mg/dL (ref 65–99)
Glucose, Bld: 51 mg/dL — ABNORMAL LOW (ref 65–99)
POTASSIUM: 4.4 mmol/L (ref 3.5–5.1)
Potassium: 6 mmol/L — ABNORMAL HIGH (ref 3.5–5.1)
SODIUM: 133 mmol/L — AB (ref 135–145)
Sodium: 130 mmol/L — ABNORMAL LOW (ref 135–145)

## 2016-04-24 LAB — I-STAT CHEM 8, ED
BUN: 45 mg/dL — ABNORMAL HIGH (ref 6–20)
CREATININE: 2 mg/dL — AB (ref 0.61–1.24)
Calcium, Ion: 1.11 mmol/L — ABNORMAL LOW (ref 1.15–1.40)
Chloride: 93 mmol/L — ABNORMAL LOW (ref 101–111)
GLUCOSE: 93 mg/dL (ref 65–99)
HCT: 30 % — ABNORMAL LOW (ref 39.0–52.0)
HEMOGLOBIN: 10.2 g/dL — AB (ref 13.0–17.0)
POTASSIUM: 5.8 mmol/L — AB (ref 3.5–5.1)
Sodium: 128 mmol/L — ABNORMAL LOW (ref 135–145)
TCO2: 27 mmol/L (ref 0–100)

## 2016-04-24 LAB — CBC WITH DIFFERENTIAL/PLATELET
BASOS ABS: 0 10*3/uL (ref 0.0–0.1)
Basophils Relative: 0 %
EOS PCT: 0 %
Eosinophils Absolute: 0 10*3/uL (ref 0.0–0.7)
HCT: 28.5 % — ABNORMAL LOW (ref 39.0–52.0)
Hemoglobin: 9.4 g/dL — ABNORMAL LOW (ref 13.0–17.0)
LYMPHS PCT: 26 %
Lymphs Abs: 1.4 10*3/uL (ref 0.7–4.0)
MCH: 31.8 pg (ref 26.0–34.0)
MCHC: 33 g/dL (ref 30.0–36.0)
MCV: 96.3 fL (ref 78.0–100.0)
MONO ABS: 0.7 10*3/uL (ref 0.1–1.0)
Monocytes Relative: 12 %
Neutro Abs: 3.3 10*3/uL (ref 1.7–7.7)
Neutrophils Relative %: 61 %
PLATELETS: 112 10*3/uL — AB (ref 150–400)
RBC: 2.96 MIL/uL — ABNORMAL LOW (ref 4.22–5.81)
RDW: 17 % — AB (ref 11.5–15.5)
WBC: 5.5 10*3/uL (ref 4.0–10.5)

## 2016-04-24 LAB — CBC
HCT: 27.4 % — ABNORMAL LOW (ref 39.0–52.0)
HEMOGLOBIN: 9 g/dL — AB (ref 13.0–17.0)
MCH: 31.4 pg (ref 26.0–34.0)
MCHC: 32.8 g/dL (ref 30.0–36.0)
MCV: 95.5 fL (ref 78.0–100.0)
Platelets: 94 10*3/uL — ABNORMAL LOW (ref 150–400)
RBC: 2.87 MIL/uL — AB (ref 4.22–5.81)
RDW: 16.9 % — ABNORMAL HIGH (ref 11.5–15.5)
WBC: 5.2 10*3/uL (ref 4.0–10.5)

## 2016-04-24 MED ORDER — ONDANSETRON HCL 4 MG/2ML IJ SOLN: 4.0000 mg | Freq: Four times a day (QID) | INTRAMUSCULAR | Status: DC | PRN

## 2016-04-24 MED ORDER — GUAIFENESIN 100 MG/5ML PO SYRP
200.0000 mg | ORAL_SOLUTION | Freq: Three times a day (TID) | ORAL | Status: DC | PRN
  Filled 2016-04-24: qty 10

## 2016-04-24 MED ORDER — ALBUTEROL SULFATE (2.5 MG/3ML) 0.083% IN NEBU: 2.5000 mg | INHALATION_SOLUTION | RESPIRATORY_TRACT | Status: DC | PRN

## 2016-04-24 MED ORDER — DEXTROSE 50 % IV SOLN
2.0000 | Freq: Once | INTRAVENOUS | Status: AC
  Administered 2016-04-24: 100 mL via INTRAVENOUS
  Filled 2016-04-24: qty 100

## 2016-04-24 MED ORDER — ONDANSETRON HCL 4 MG PO TABS: 4.0000 mg | ORAL_TABLET | Freq: Four times a day (QID) | ORAL | Status: DC | PRN

## 2016-04-24 MED ORDER — CALCIUM GLUCONATE 10 % IV SOLN
2.0000 g | Freq: Once | INTRAVENOUS | Status: AC
  Administered 2016-04-24: 2 g via INTRAVENOUS
  Filled 2016-04-24: qty 20

## 2016-04-24 MED ORDER — ACETAMINOPHEN 325 MG PO TABS: 650.0000 mg | ORAL_TABLET | Freq: Four times a day (QID) | ORAL | Status: DC | PRN

## 2016-04-24 MED ORDER — FERROUS SULFATE 325 (65 FE) MG PO TABS
325.0000 mg | ORAL_TABLET | Freq: Two times a day (BID) | ORAL | Status: DC
  Administered 2016-04-24 – 2016-04-25 (×3): 325 mg via ORAL
  Filled 2016-04-24 (×4): qty 1

## 2016-04-24 MED ORDER — ALBUTEROL SULFATE (2.5 MG/3ML) 0.083% IN NEBU: 2.5000 mg | INHALATION_SOLUTION | Freq: Four times a day (QID) | RESPIRATORY_TRACT | Status: DC | PRN

## 2016-04-24 MED ORDER — PANTOPRAZOLE SODIUM 40 MG PO TBEC
40.0000 mg | DELAYED_RELEASE_TABLET | Freq: Two times a day (BID) | ORAL | Status: DC
  Administered 2016-04-24 – 2016-04-25 (×3): 40 mg via ORAL
  Filled 2016-04-24 (×3): qty 1

## 2016-04-24 MED ORDER — ALLOPURINOL 300 MG PO TABS
300.0000 mg | ORAL_TABLET | Freq: Every day | ORAL | Status: DC
  Administered 2016-04-24 – 2016-04-25 (×2): 300 mg via ORAL
  Filled 2016-04-24 (×2): qty 1

## 2016-04-24 MED ORDER — INSULIN ASPART 100 UNIT/ML ~~LOC~~ SOLN
10.0000 [IU] | Freq: Once | SUBCUTANEOUS | Status: AC
  Administered 2016-04-24: 10 [IU] via INTRAVENOUS
  Filled 2016-04-24: qty 1

## 2016-04-24 MED ORDER — HYDROCORTISONE 1 % EX CREA: 1.0000 "application " | TOPICAL_CREAM | Freq: Two times a day (BID) | CUTANEOUS | Status: DC | PRN

## 2016-04-24 MED ORDER — ACETAMINOPHEN 500 MG PO TABS: 500.0000 mg | ORAL_TABLET | Freq: Four times a day (QID) | ORAL | Status: DC | PRN

## 2016-04-24 MED ORDER — APIXABAN 2.5 MG PO TABS
2.5000 mg | ORAL_TABLET | Freq: Two times a day (BID) | ORAL | Status: DC
  Administered 2016-04-24 – 2016-04-25 (×4): 2.5 mg via ORAL
  Filled 2016-04-24 (×5): qty 1

## 2016-04-24 MED ORDER — BENZONATATE 100 MG PO CAPS
100.0000 mg | ORAL_CAPSULE | Freq: Three times a day (TID) | ORAL | Status: DC
  Administered 2016-04-24 – 2016-04-25 (×4): 100 mg via ORAL
  Filled 2016-04-24 (×4): qty 1

## 2016-04-24 MED ORDER — INSULIN ASPART 100 UNIT/ML IV SOLN
10.0000 [IU] | Freq: Once | INTRAVENOUS | Status: DC
  Filled 2016-04-24: qty 0.1

## 2016-04-24 MED ORDER — OXYCODONE HCL 5 MG PO TABS
5.0000 mg | ORAL_TABLET | ORAL | Status: DC | PRN
  Administered 2016-04-24: 5 mg via ORAL
  Filled 2016-04-24: qty 1

## 2016-04-24 MED ORDER — SODIUM CHLORIDE 0.9 % IV SOLN
Freq: Once | INTRAVENOUS | Status: AC
  Administered 2016-04-24: 250 mL via INTRAVENOUS

## 2016-04-24 MED ORDER — SODIUM BICARBONATE 8.4 % IV SOLN
50.0000 meq | Freq: Once | INTRAVENOUS | Status: AC
  Administered 2016-04-24: 50 meq via INTRAVENOUS
  Filled 2016-04-24: qty 50

## 2016-04-24 MED ORDER — ACETAMINOPHEN 650 MG RE SUPP: 650.0000 mg | Freq: Four times a day (QID) | RECTAL | Status: DC | PRN

## 2016-04-24 NOTE — ED Notes (Signed)
Pt requesting tums for stomach. Will call admitting MD for order.

## 2016-04-24 NOTE — Progress Notes (Signed)
Patient admitted after midnight. For details please refer to admission note done 04/24/2016.  Patient admitted for abnormal blood work. Specifically his potassium was 6 and his platelet count was in low 100 range. Patient was treated for high potassium with hyperkalemia protocol. Repeat potassium was within normal limits. His platelet count is 94 this morning. We will repeat blood work tomorrow morning and if stable most likely he will be able to go home.  Leisa Lenz [facility] [phone]

## 2016-04-24 NOTE — Discharge Instructions (Signed)

## 2016-04-24 NOTE — ED Triage Notes (Signed)
Pt sent here from PCP, told potassium was too high. Pt states K > 6. Pt with no complaints. Pt denies any abdominal pain or chest pain. Pt a/o x 4.

## 2016-04-24 NOTE — ED Notes (Signed)
Called lab to add on CBC

## 2016-04-24 NOTE — Progress Notes (Signed)
PT Cancellation Note  Patient Details Name: [name] MRN: [id number] DOB: 07-Oct-1932   Cancelled Treatment:    Reason Eval/Treat Not Completed: Other (comment) (attempted to see pt however history being obtained by RN and was deferred by RN)   [name] 04/24/2016, 1:44 PM  Elwyn Reach, [place]

## 2016-04-24 NOTE — Consult Note (Signed)
   [facility] CM Inpatient Consult   04/24/2016  [name] 1932/09/29 [id number]  Patient was active with [facility] services.  Chart review reveals the patient and family was to have a meeting with Hospice today.  Acknowledgment of patient was previously active with [employer] [facility] with [facility]. Will follow for progression and disposition.  For questions, please contact:  Natividad Brood, RN BSN [facility] Liaison  [phone] business mobile phone Toll free office [phone]

## 2016-04-24 NOTE — Clinical Social Work Note (Signed)
CSW acknowledges consult about patient's meeting with hospice today. CSW contacted [facility] admissions coordinator. She stated the nurse was supposed to meet with patient at his home today but she will notify her that the patient has been admitted. CSW will continue to follow for any discharge needs.  [name], [place]

## 2016-04-24 NOTE — H&P (Signed)
History and Physical    [name]:[id number] DOB: 1933-03-11 DOA: 04/24/2016  Referring MD/NP/PA: Dr. Claudine [name] PCP: [name] Gravel, MD  Patient coming from: Home  Chief Complaint: Abnormal labwork  HPI: [name] is a 83 y.o. male with medical history significant of systolic CHF, HTN, CAD, chronic respiratory failure on oxygen, COPD,  and DM type II;  who presents after having abnormal lab work of elevated potassium of 6.4. Patient had gone to his cardiologist's office today for routine follow-up and lab work was drawn at that time. During his evaluation with with TerryHochrein his systolic blood pressures were noted to be in the 70s, and he was reported to have become somnolent and nonresponsive. Patient was laid down with his feet elevated up with repeat systolic blood pressures noted be in the 90s. Patient was noted to respond quickly. At that time he was started on spironolactone and Cozaar. Otherwise patient denies any complaints of chest pain, nausea, vomiting, fever, orthopnea, shortness of breath, or loss of consciousness.  ED Course: Upon admission to the emergency department patient was evaluated and seen have blood pressures as low as 75/42, all other vitals are relatively within normal limits. Laboratory revealed WBC 5.5, hemoglobin 9.4, platelets 112, sodium 1:30, potassium 6, chloride 92, BUN 45, and creatinine 1.92. Patient was given calcium gluconate, insulin, glucose, and sodium bicarbonate. Significant EKG changes were noted. TRH called to admit for further treatment of hyperkalemia.   Review of Systems: As per HPI otherwise 10 point review of systems negative.   Past Medical History:  Diagnosis Date  . Arthritis   . Black lung disease ([place])   . CAD (coronary artery disease)    a. 07/2002 CABG x 3: LIMA->LAD, VG->Diag, VG->OM;  b. 06/2006 Cath: LM 50-60ost/p, LAD patent mid stent, D1 sev dzs, D2 patent stent, LCX nl, OM2 sev sten prox, RCA large/nl, VG->Diag nl, VG->OM nl,  LIMA->LAD atretic, EF 30%.  . Carotid artery occlusion   . Chronic atrial fibrillation ([place])   . Chronic respiratory failure ([place])   . Chronic systolic CHF (congestive heart failure) ([place])    a. 12/2012 Echo: EF 20-25%, mid-dist antsept AK, mod dil LA.  [name] COPD (chronic obstructive pulmonary disease) ([place])    a. On home O2.  [name] CVA (cerebral vascular accident) ([place])   . Diabetes mellitus   . GERD (gastroesophageal reflux disease)   . Gout   . Hypercholesterolemia   . Hypertension   . Ischemic cardiomyopathy    a. 06/2006  BIV-ICD -> gen change 2015 to [place].  . Lung cancer ([place])   . Nephrolithiasis 09/2000  . On home oxygen therapy    "2.5L; 24/7" (01/16/2016)  . Prostate cancer ([place]) 11/01/10   gleason 7, 8, 9, gold seeds 02/08/11  . Symptomatic bradycardia     Past Surgical History:  Procedure Laterality Date  . BI-VENTRICULAR PACEMAKER INSERTION (CRT-P)  12-02-13   downgrade of previously implanted CRTD to STJ CRTP  . BIV PACEMAKER GENERATOR CHANGE OUT N/A 12/02/2013   Procedure: BIV PACEMAKER GENERATOR CHANGE OUT;  Surgeon: Evans Lance, MD;  Location[facility] CATH LAB;  Service: Cardiovascular;  Laterality: N/A;  . COLONOSCOPY    . CORONARY ANGIOPLASTY WITH STENT PLACEMENT  07/1997; 08/1997;03/1998  . CORONARY ARTERY BYPASS GRAFT  07/2002   CABG X3  . ESOPHAGOGASTRODUODENOSCOPY  02/19/2011   Procedure: ESOPHAGOGASTRODUODENOSCOPY (EGD);  Surgeon: Jeryl Columbia, MD;  Location: [facility] ENDOSCOPY;  Service: Endoscopy;  Laterality: N/A;  . FEMORAL-POPLITEAL  BYPASS GRAFT Right 01/20/2016   Procedure: THROMBECTOMY OF POPLITEAL ARTERY AND ANTERIOR TIBIAL ARTERY RIGHT LEG; INTRAOPERATIVE ARTERIOGRAM;  Surgeon: Waynetta Sandy, MD;  Location: [place];  Service: Vascular;  Laterality: Right;  . INCISION AND DRAINAGE OF WOUND  08/2002   right thigh; S/P EVH  . INSERT / REPLACE / [place]; 1992; 01/2000;  . INSERT / REPLACE / REMOVE PACEMAKER  09/2003; 06/2006   w/AICD  .  INSERT / REPLACE / REMOVE PACEMAKER  12/2004   pacmaker explant  . SHOULDER ARTHROSCOPY W/ ROTATOR CUFF REPAIR  05/2008   left  . upper endoscpopy       reports that he quit smoking about 67 years ago. His smoking use included Cigarettes. His smokeless tobacco use includes Chew. He reports that he does not drink alcohol or use drugs.  Allergies  Allergen Reactions  . [place] [Digoxin] Other (See Comments)    Caused patient to have chest pains and dizzy spells  . Aspirin Hives  . Penicillins Rash    Rash Has patient had a PCN reaction causing immediate rash, facial/tongue/throat swelling, SOB or lightheadedness with hypotension:YES Has patient had a PCN reaction causing severe rash involving mucus membranes or skin necrosis: NO Has patient had a PCN reaction that required hospitalization NO Has patient had a PCN reaction occurring within the last 10 years:NO If all of the above answers are "NO", then may proceed with Cephalosporin use.    Family History  Problem Relation Age of Onset  . Alzheimer's disease Father 31  . Cancer Father 59    metastatic prostate cancer  . Diabetes Sister 103  . Diabetes Brother   . Diabetes Brother   . Diabetes Brother   . Hypotension Neg Hx   . Malignant hyperthermia Neg Hx   . Pseudochol deficiency Neg Hx     Prior to Admission medications   Medication Sig Start Date End Date Taking? Authorizing Provider  acetaminophen (TYLENOL) 500 MG tablet Take 500 mg by mouth every 6 (six) hours as needed (pain).    Historical Provider, MD  albuterol (PROVENTIL HFA;VENTOLIN HFA) 108 (90 Base) MCG/ACT inhaler Inhale 2 puffs into the lungs every 4 (four) hours as needed for wheezing or shortness of breath. 06/10/15   Shawn C Joy, PA-C  albuterol (PROVENTIL) (2.5 MG/3ML) 0.083% nebulizer solution Take 2.5 mg by nebulization every 6 (six) hours as needed for wheezing or shortness of breath.    Historical Provider, MD  allopurinol (ZYLOPRIM) 300 MG tablet Take 300  mg by mouth daily.      Historical Provider, MD  benzonatate (TESSALON) 100 MG capsule Take 1 capsule (100 mg total) by mouth every 8 (eight) hours. 06/10/15   Shawn C Joy, PA-C  CALCIUM-VITAMIN D PO Take 1 tablet by mouth 2 (two) times daily.     Historical Provider, MD  ELIQUIS 2.5 MG TABS tablet Take 1 tablet by mouth 2 (two) times daily. 04/06/16   Historical Provider, MD  ferrous sulfate 325 (65 FE) MG tablet Take 1 tablet (325 mg total) by mouth 2 (two) times daily with a meal. 01/26/16   Costin Karlyne Greenspan, MD  furosemide (LASIX) 40 MG tablet Take 1 tablet (40 mg total) by mouth 2 (two) times daily. 04/06/16   Maryann Mikhail, DO  guaifenesin (ROBITUSSIN) 100 MG/5ML syrup Take 200 mg by mouth 3 (three) times daily as needed for cough or congestion.     Historical Provider, MD  hydrocortisone cream 1 % Apply  to affected area 2 times daily Patient taking differently: Apply 1 application topically 2 (two) times daily as needed for itching.  10/05/14   Fransico Meadow, PA-C  levofloxacin (LEVAQUIN) 750 MG tablet Take 1 tablet (750 mg total) by mouth every other day. 04/06/16   Maryann Mikhail, DO  nitroGLYCERIN (NITROSTAT) 0.4 MG SL tablet Place 0.4 mg under the tongue every 5 (five) minutes as needed for chest pain.    Historical Provider, MD  Omega-3 Fatty Acids (FISH OIL) 1000 MG CAPS Take 1,000 mg by mouth at bedtime.     Historical Provider, MD  omeprazole (PRILOSEC) 40 MG capsule Take 1 capsule (40 mg total) by mouth 2 (two) times daily. 01/20/16   Orson Eva, MD  oxyCODONE (OXY IR/ROXICODONE) 5 MG immediate release tablet Take 1 tablet (5 mg total) by mouth every 4 (four) hours as needed for moderate pain. 01/26/16   [place], MD  OXYGEN Inhale 2.5 L into the lungs continuous.    Historical Provider, MD  potassium chloride SA (K-DUR,KLOR-CON) 20 MEQ tablet Take 2 tablets (40 mEq total) by mouth daily. 04/06/16   Maryann Mikhail, DO  predniSONE (DELTASONE) 20 MG tablet Take 3 tabs x 3 days,  then 2 tabs x 3 days, then 1 tabs x 3 days 04/06/16   Cristal Ford, DO  vitamin B-12 (CYANOCOBALAMIN) 1000 MCG tablet Take 1,000 mcg by mouth 2 (two) times daily.    Historical Provider, MD    Physical Exam:   Constitutional:Elderly male in NAD, calm, comfortable Vitals:   04/24/16 0004 04/24/16 0005 04/24/16 0011 04/24/16 0115  BP:  96/62  103/58  Pulse:  (!) 55  (!) 59  Resp:  16  21  Temp:  97.5 F (36.4 C)    TempSrc:  Oral    SpO2:   100% 100%  Weight: 53.1 kg (117 lb)     Height: '5\' 6"'$  (1.676 m)      Eyes: PERRL, lids and conjunctivae normal ENMT: Mucous membranes are moist. Posterior pharynx clear of any exudate or lesions.Normal dentition.  Neck: normal, supple, no masses, no thyromegaly Respiratory: clear to auscultation bilaterally, no wheezing, no crackles. Normal respiratory effort. No accessory muscle use.  Cardiovascular: Regular rate and rhythm, + SEM / rubs / gallops. No extremity edema. 1+ pedal pulses. No carotid bruits.  Abdomen: no tenderness, no masses palpated. No hepatosplenomegaly. Bowel sounds positive.  Musculoskeletal: no clubbing / cyanosis. No joint deformity upper and lower extremities. Good ROM, no contractures. Normal muscle tone.  Skin: no rashes, lesions, ulcers. No induration Neurologic: CN 2-12 grossly intact. Sensation intact, DTR normal. Strength 5/5 in all 4.  Psychiatric: Normal judgment and insight. Alert and oriented x 3. Normal mood.     Labs on Admission: I have personally reviewed following labs and imaging studies  CBC:  Recent Labs Lab 04/24/16 0038 04/24/16 0051  WBC 5.5  --   NEUTROABS 3.3  --   HGB 9.4* 10.2*  HCT 28.5* 30.0*  MCV 96.3  --   PLT 112*  --    Basic Metabolic Panel:  Recent Labs Lab 04/23/16 1212 04/24/16 0029 04/24/16 0051  NA 129* 130* 128*  K 6.4* 6.0* 5.8*  CL 92* 92* 93*  CO2 24 25  --   GLUCOSE 86 94 93  BUN 46* 45* 45*  CREATININE 1.83* 1.92* 2.00*  CALCIUM 9.3 9.6  --     GFR: Estimated Creatinine Clearance: 21.8 mL/min (by C-G formula based on SCr  of 2 mg/dL (H)). Liver Function Tests: No results for input(s): AST, ALT, ALKPHOS, BILITOT, PROT, ALBUMIN in the last 168 hours. No results for input(s): LIPASE, AMYLASE in the last 168 hours. No results for input(s): AMMONIA in the last 168 hours. Coagulation Profile: No results for input(s): INR, PROTIME in the last 168 hours. Cardiac Enzymes: No results for input(s): CKTOTAL, CKMB, CKMBINDEX, TROPONINI in the last 168 hours. BNP (last 3 results) No results for input(s): PROBNP in the last 8760 hours. HbA1C: No results for input(s): HGBA1C in the last 72 hours. CBG: No results for input(s): GLUCAP in the last 168 hours. Lipid Profile: No results for input(s): CHOL, HDL, LDLCALC, TRIG, CHOLHDL, LDLDIRECT in the last 72 hours. Thyroid Function Tests: No results for input(s): TSH, T4TOTAL, FREET4, T3FREE, THYROIDAB in the last 72 hours. Anemia Panel: No results for input(s): VITAMINB12, FOLATE, FERRITIN, TIBC, IRON, RETICCTPCT in the last 72 hours. Urine analysis:    Component Value Date/Time   COLORURINE YELLOW 01/21/2016 0114   APPEARANCEUR CLEAR 01/21/2016 0114   LABSPEC 1.012 01/21/2016 0114   LABSPEC 1.015 05/03/2011 0950   PHURINE 6.0 01/21/2016 0114   GLUCOSEU NEGATIVE 01/21/2016 0114   HGBUR SMALL (A) 01/21/2016 0114   BILIRUBINUR NEGATIVE 01/21/2016 0114   BILIRUBINUR Color Interference 05/03/2011 0950   KETONESUR NEGATIVE 01/21/2016 0114   PROTEINUR NEGATIVE 01/21/2016 0114   UROBILINOGEN 0.2 11/10/2014 1815   NITRITE NEGATIVE 01/21/2016 0114   LEUKOCYTESUR NEGATIVE 01/21/2016 0114   LEUKOCYTESUR Color Interference 05/03/2011 0950   Sepsis Labs: No results found for this or any previous visit (from the past 240 hour(s)).   Radiological Exams on Admission: No results found.  EKG: Independently reviewed. Ventricular paced rhythm  Assessment/Plan Hyperkalemia: Patient presents  with potassium of 6. Initially given calcium gluconate, sodium bicarbonate, insulin, and glucose. Recently taken off losartan and spironolactone. - Admit to telemetry bed - Held potassium supplementation - recheck Potassium in a.m.  Transient Hypotension:  Blood pressures on admission noted as low as 75/42.  - NS IVF at 50 ml/hr  X 5hr  Chronic respiratory failure/COPD, without acute exacerbation:  - Continuous pulse oximetry with nasal cannula oxygen at home 2.5 L  - Albuterol nebs as needed  Chronic systolic congestive heart failure last EF noted to be 10-15% based off echocardiogram from 01/2016 - Strict ins and outs and daily weights   Essential hypertension: The patient recently started on spironolactone and losartan. - Will need to restart Lasix when medically appropriate  Chronic anemia: Hemoglobin 9.4 on admission. Baseline hemoglobin appears to be 9-10. - Continue to monitor CBC  Chronic kidney disease stage III: Patient presents with a elevated BUN of 45 and creatinine 1.92. Similar to previous values during last hospitalization. - Follow repeat BMP in a.m.  Chronic atrial fibrillation/ bradycardia/MI s/p pacemaker: chadsvasc score= 5 - Continue Eliquis  Thrombocytopenia: Chronic. - Continue to monitor   GERD - Pharmacy substitution of Protonix bid   DVT prophylaxis: Eliquis  Code Status: DO NOT RESUSCITATE Family Communication: no family at bedside  Disposition Plan: Likely discharge home if medically stable Consults called: none  Admission status: Observation  Norval Morton MD [facility] Pager [phone]  If 7PM-7AM, please contact night-coverage [email or web address] Password [email or web address]  04/24/2016, 2:11 AM

## 2016-04-24 NOTE — Patient Outreach (Signed)
Transition of care/ care coordination:  Patient is currently in the ED.  In basket message sent to [facility] hospital liaison.   Cancelled transition of care follow up call for today.   PLAN: Will continue to follow. [name], RN, BSN, CEN [facility] [employer] [phone]

## 2016-04-24 NOTE — ED Notes (Signed)
Pt agitated because he did not understand how to work phone in room. This RN apologized for any confusion, assisted pt in calling daughter. Pt on phone w/ daughter at this time. No other questions/concerns at this time.

## 2016-04-24 NOTE — ED Provider Notes (Signed)
[facility] DEPT Provider Note   CSN: [id number] Arrival date & time: 04/23/16  2357  By signing my name below, I, Dolores Hoose, attest that this documentation has been prepared under the direction and in the presence of Everlene Balls, MD . Electronically Signed: Dolores Hoose, Scribe. 04/24/2016. 12:48 AM.  History   Chief Complaint Chief Complaint  Patient presents with  . Abnormal Lab    K = 6.4   The history is provided by the patient. No language interpreter was used.    HPI Comments:  [name] is a 83 y.o. male with pmhx of lack lung, CAD, CHF, COPD, DM, HTN and Lung CA who presents to the Emergency Department from his PCP after presenting there with a potassium level above 6. Pt originally went to his PCP after suffering from a sudden BP drop. No modifying factors indicated. Pt denies any abdominal pain, chest pain, or any other symptoms.   Pacemaker in left chest wall Systolic murmur     Past Medical History:  Diagnosis Date  . Arthritis   . Black lung disease ([place])   . CAD (coronary artery disease)    a. 07/2002 CABG x 3: LIMA->LAD, VG->Diag, VG->OM;  b. 06/2006 Cath: LM 50-60ost/p, LAD patent mid stent, D1 sev dzs, D2 patent stent, LCX nl, OM2 sev sten prox, RCA large/nl, VG->Diag nl, VG->OM nl, LIMA->LAD atretic, EF 30%.  . Carotid artery occlusion   . Chronic atrial fibrillation ([place])   . Chronic respiratory failure ([place])   . Chronic systolic CHF (congestive heart failure) ([place])    a. 12/2012 Echo: EF 20-25%, mid-dist antsept AK, mod dil LA.  [name] COPD (chronic obstructive pulmonary disease) ([place])    a. On home O2.  [name] CVA (cerebral vascular accident) ([place])   . Diabetes mellitus   . GERD (gastroesophageal reflux disease)   . Gout   . Hypercholesterolemia   . Hypertension   . Ischemic cardiomyopathy    a. 06/2006  BIV-ICD -> gen change 2015 to [place].  . Lung cancer ([place])   . Nephrolithiasis 09/2000  . On home oxygen therapy    "2.5L; 24/7"  (01/16/2016)  . Prostate cancer ([place]) 11/01/10   gleason 7, 8, 9, gold seeds 02/08/11  . Symptomatic bradycardia     Patient Active Problem List   Diagnosis Date Noted  . Goals of care, counseling/discussion   . CHF exacerbation ([place]) 04/02/2016  . Acute on chronic respiratory failure ([place]) 04/02/2016  . COPD exacerbation ([place]) 04/02/2016  . Macrocytic anemia 04/02/2016  . Arterial thrombosis ([place]) 01/20/2016  . DM2 (diabetes mellitus, type 2) ([place]) 01/20/2016  . Critical lower limb ischemia 01/20/2016  . Ischemic leg 01/20/2016  . Chronic renal disease, stage III 01/19/2016  . Chronic anticoagulation-Xarelto 01/19/2016  . Cardiomyopathy, ischemic 01/19/2016  . Acute on chronic combined systolic and diastolic CHF (congestive heart failure) ([place]) 01/18/2016  . Anemia 01/16/2016  . Black lung disease ([place]) 10/27/2015  . Pain in the chest   . Hx of CABG x 3 2004 after multiple PCI's in'90s   . Chronic respiratory failure 08/08/2013  . CAP (community acquired pneumonia) 08/07/2013  . Pneumonia 08/07/2013  . Occlusion and stenosis of carotid artery without mention of cerebral infarction 05/26/2013  . Syncope 02/15/2013  . Weakness generalized 05/14/2011  . On home oxygen therapy   . Prostate cancer ([place])   . Pacemaker   . Hematochezia 02/19/2011  . Melena 02/19/2011  . Prostate cancer, recur risk not determined whether  low, med or high ([place])   . Systolic CHF, chronic ([place]) 02/16/2011  . SBO (small bowel obstruction) 02/15/2011  . ARF (acute renal failure) ([place]) 02/15/2011  . UTI (lower urinary tract infection) 02/15/2011  . Syncope and collapse 02/10/2011  . S/P St Jude BiV ICD upgrade 2015 05/24/2009  . Cardiac conduction disorder 06/01/2008  . Chronic a-fib ([place]) 06/01/2008    Past Surgical History:  Procedure Laterality Date  . BI-VENTRICULAR PACEMAKER INSERTION (CRT-P)  12-02-13   downgrade of previously implanted CRTD to STJ CRTP  . BIV PACEMAKER GENERATOR CHANGE OUT  N/A 12/02/2013   Procedure: BIV PACEMAKER GENERATOR CHANGE OUT;  Surgeon: Evans Lance, MD;  Location: [facility] CATH LAB;  Service: Cardiovascular;  Laterality: N/A;  . COLONOSCOPY    . CORONARY ANGIOPLASTY WITH STENT PLACEMENT  07/1997; 08/1997;03/1998  . CORONARY ARTERY BYPASS GRAFT  07/2002   CABG X3  . ESOPHAGOGASTRODUODENOSCOPY  02/19/2011   Procedure: ESOPHAGOGASTRODUODENOSCOPY (EGD);  Surgeon: Jeryl Columbia, MD;  Location: [facility] ENDOSCOPY;  Service: Endoscopy;  Laterality: N/A;  . FEMORAL-POPLITEAL BYPASS GRAFT Right 01/20/2016   Procedure: THROMBECTOMY OF POPLITEAL ARTERY AND ANTERIOR TIBIAL ARTERY RIGHT LEG; INTRAOPERATIVE ARTERIOGRAM;  Surgeon: Waynetta Sandy, MD;  Location: [facility];  Service: Vascular;  Laterality: Right;  . INCISION AND DRAINAGE OF WOUND  08/2002   right thigh; S/P EVH  . INSERT / REPLACE / [place]; 1992; 01/2000;  . INSERT / REPLACE / REMOVE PACEMAKER  09/2003; 06/2006   w/AICD  . INSERT / REPLACE / REMOVE PACEMAKER  12/2004   pacmaker explant  . SHOULDER ARTHROSCOPY W/ ROTATOR CUFF REPAIR  05/2008   left  . upper endoscpopy         Home Medications    Prior to Admission medications   Medication Sig Start Date End Date Taking? Authorizing Provider  acetaminophen (TYLENOL) 500 MG tablet Take 500 mg by mouth every 6 (six) hours as needed (pain).    Historical Provider, MD  albuterol (PROVENTIL HFA;VENTOLIN HFA) 108 (90 Base) MCG/ACT inhaler Inhale 2 puffs into the lungs every 4 (four) hours as needed for wheezing or shortness of breath. 06/10/15   Shawn C Joy, PA-C  albuterol (PROVENTIL) (2.5 MG/3ML) 0.083% nebulizer solution Take 2.5 mg by nebulization every 6 (six) hours as needed for wheezing or shortness of breath.    Historical Provider, MD  allopurinol (ZYLOPRIM) 300 MG tablet Take 300 mg by mouth daily.      Historical Provider, MD  benzonatate (TESSALON) 100 MG capsule Take 1 capsule (100 mg total) by mouth every 8 (eight) hours. 06/10/15    Shawn C Joy, PA-C  CALCIUM-VITAMIN D PO Take 1 tablet by mouth 2 (two) times daily.     Historical Provider, MD  ELIQUIS 2.5 MG TABS tablet Take 1 tablet by mouth 2 (two) times daily. 04/06/16   Historical Provider, MD  ferrous sulfate 325 (65 FE) MG tablet Take 1 tablet (325 mg total) by mouth 2 (two) times daily with a meal. 01/26/16   Costin Karlyne Greenspan, MD  furosemide (LASIX) 40 MG tablet Take 1 tablet (40 mg total) by mouth 2 (two) times daily. 04/06/16   Maryann Mikhail, DO  guaifenesin (ROBITUSSIN) 100 MG/5ML syrup Take 200 mg by mouth 3 (three) times daily as needed for cough or congestion.     Historical Provider, MD  hydrocortisone cream 1 % Apply to affected area 2 times daily Patient taking differently: Apply 1 application topically 2 (two) times daily  as needed for itching.  10/05/14   Fransico Meadow, PA-C  levofloxacin (LEVAQUIN) 750 MG tablet Take 1 tablet (750 mg total) by mouth every other day. 04/06/16   Maryann Mikhail, DO  nitroGLYCERIN (NITROSTAT) 0.4 MG SL tablet Place 0.4 mg under the tongue every 5 (five) minutes as needed for chest pain.    Historical Provider, MD  Omega-3 Fatty Acids (FISH OIL) 1000 MG CAPS Take 1,000 mg by mouth at bedtime.     Historical Provider, MD  omeprazole (PRILOSEC) 40 MG capsule Take 1 capsule (40 mg total) by mouth 2 (two) times daily. 01/20/16   Orson Eva, MD  oxyCODONE (OXY IR/ROXICODONE) 5 MG immediate release tablet Take 1 tablet (5 mg total) by mouth every 4 (four) hours as needed for moderate pain. 01/26/16   [place], MD  OXYGEN Inhale 2.5 L into the lungs continuous.    Historical Provider, MD  potassium chloride SA (K-DUR,KLOR-CON) 20 MEQ tablet Take 2 tablets (40 mEq total) by mouth daily. 04/06/16   Maryann Mikhail, DO  predniSONE (DELTASONE) 20 MG tablet Take 3 tabs x 3 days, then 2 tabs x 3 days, then 1 tabs x 3 days 04/06/16   Cristal Ford, DO  vitamin B-12 (CYANOCOBALAMIN) 1000 MCG tablet Take 1,000 mcg by mouth 2 (two) times  daily.    Historical Provider, MD    Family History Family History  Problem Relation Age of Onset  . Alzheimer's disease Father 39  . Cancer Father 85    metastatic prostate cancer  . Diabetes Sister 34  . Diabetes Brother   . Diabetes Brother   . Diabetes Brother   . Hypotension Neg Hx   . Malignant hyperthermia Neg Hx   . Pseudochol deficiency Neg Hx     Social History Social History  Substance Use Topics  . Smoking status: Former Smoker    Types: Cigarettes    Quit date: 03/12/1949  . Smokeless tobacco: Current User    Types: Chew  . Alcohol use No     Comment: drank until per pt 03/19/11, h/o heavy use     Allergies   Digitek [digoxin]; Aspirin; and Penicillins   Review of Systems Review of Systems 10 Systems reviewed and are negative for acute change except as noted in the HPI.   Physical Exam Updated Vital Signs BP 96/62 (BP Location: Left Arm)   Pulse (!) 55   Temp 97.5 F (36.4 C) (Oral)   Resp 16   Ht '5\' 6"'$  (1.676 m)   Wt 117 lb (53.1 kg)   SpO2 100%   BMI 18.88 kg/m   Physical Exam  Constitutional: He is oriented to person, place, and time. Vital signs are normal. He appears well-developed and well-nourished.  Non-toxic appearance. He does not appear ill. No distress.  HENT:  Head: Normocephalic and atraumatic.  Nose: Nose normal.  Mouth/Throat: Oropharynx is clear and moist. No oropharyngeal exudate.  Eyes: Conjunctivae and EOM are normal. Pupils are equal, round, and reactive to light. No scleral icterus.  Neck: Normal range of motion. Neck supple. No tracheal deviation, no edema, no erythema and normal range of motion present. No thyroid mass and no thyromegaly present.  Cardiovascular: Normal rate, regular rhythm, S1 normal, S2 normal, intact distal pulses and normal pulses.  Exam reveals no gallop and no friction rub.   Murmur heard. Pacemaker in left chest wall. Systolic murmur  Pulmonary/Chest: Effort normal and breath sounds normal. No  respiratory distress. He has no  wheezes. He has no rhonchi. He has no rales.  Abdominal: Soft. Normal appearance and bowel sounds are normal. He exhibits no distension, no ascites and no mass. There is no hepatosplenomegaly. There is no tenderness. There is no rebound, no guarding and no CVA tenderness.  Musculoskeletal: Normal range of motion. He exhibits no edema or tenderness.  Lymphadenopathy:    He has no cervical adenopathy.  Neurological: He is alert and oriented to person, place, and time. He has normal strength. No cranial nerve deficit or sensory deficit.  Skin: Skin is warm, dry and intact. No petechiae and no rash noted. He is not diaphoretic. No erythema. No pallor.  Nursing note and vitals reviewed.    ED Treatments / Results  DIAGNOSTIC STUDIES:  Oxygen Saturation is 100% on RA, normal by my interpretation.    COORDINATION OF CARE:  12:47 AM Discussed treatment plan with pt at bedside which includes lab work and pt agreed to plan.  Labs (all labs ordered are listed, but only abnormal results are displayed) Labs Reviewed  BASIC METABOLIC PANEL  CBC WITH DIFFERENTIAL/PLATELET  I-STAT CHEM 8, ED    EKG  EKG Interpretation  Date/Time:  Tuesday April 24 2016 00:26:54 EST Ventricular Rate:  60 PR Interval:    QRS Duration: 158 QT Interval:  476 QTC Calculation: 476 R Axis:   129 Text Interpretation:  Ventricular-paced rhythm Biventricular pacemaker detected no scarbossa No significant change since last tracing Confirmed by Glynn Octave [phone]) on 04/24/2016 12:39:44 AM       Radiology No results found.  Procedures Procedures (including critical care time)  Medications Ordered in ED Medications  calcium gluconate 2 g in sodium chloride 0.9 % 100 mL IVPB (not administered)  dextrose 50 % solution 100 mL (not administered)  sodium bicarbonate injection 50 mEq (not administered)  insulin aspart (novoLOG) injection 10 Units (not administered)      Initial Impression / Assessment and Plan / ED Course  I have reviewed the triage vital signs and the nursing notes.  Pertinent labs & imaging results that were available during my care of the patient were reviewed by me and considered in my medical decision making (see chart for details).  Patient presents to the ED for hyperkalemia, likely due to him being placed on potassium. He states he has been placed on and off of this several times.  EKG does not show changes.  He was given calcium, bicarb, insulin for treatment acutely. Plan to admit for further care.  I spoke with Dr. Tamala Julian who accepts.  CRITICAL CARE Performed by: Everlene Balls   Total critical care time: 45 minutes - hyperkalemia  Critical care time was exclusive of separately billable procedures and treating other patients.  Critical care was necessary to treat or prevent imminent or life-threatening deterioration.  Critical care was time spent personally by me on the following activities: development of treatment plan with patient and/or surrogate as well as nursing, discussions with consultants, evaluation of patient's response to treatment, examination of patient, obtaining history from patient or surrogate, ordering and performing treatments and interventions, ordering and review of laboratory studies, ordering and review of radiographic studies, pulse oximetry and re-evaluation of patient's condition.       Final Clinical Impressions(s) / ED Diagnoses   Final diagnoses:  None    New Prescriptions New Prescriptions   No medications on file    I personally performed the services described in this documentation, which was scribed in my presence. The recorded  information has been reviewed and is accurate.       Everlene Balls, MD 04/24/16 [phone]

## 2016-04-25 DIAGNOSIS — E875 Hyperkalemia: Secondary | ICD-10-CM | POA: Diagnosis not present

## 2016-04-25 DIAGNOSIS — I10 Essential (primary) hypertension: Secondary | ICD-10-CM | POA: Diagnosis not present

## 2016-04-25 DIAGNOSIS — I482 Chronic atrial fibrillation: Secondary | ICD-10-CM | POA: Diagnosis not present

## 2016-04-25 LAB — CBC
HEMATOCRIT: 26.1 % — AB (ref 39.0–52.0)
Hemoglobin: 8.5 g/dL — ABNORMAL LOW (ref 13.0–17.0)
MCH: 31.1 pg (ref 26.0–34.0)
MCHC: 32.6 g/dL (ref 30.0–36.0)
MCV: 95.6 fL (ref 78.0–100.0)
PLATELETS: 98 10*3/uL — AB (ref 150–400)
RBC: 2.73 MIL/uL — AB (ref 4.22–5.81)
RDW: 16.9 % — ABNORMAL HIGH (ref 11.5–15.5)
WBC: 3.1 10*3/uL — AB (ref 4.0–10.5)

## 2016-04-25 LAB — BASIC METABOLIC PANEL
Anion gap: 11 (ref 5–15)
BUN: 37 mg/dL — AB (ref 6–20)
CHLORIDE: 94 mmol/L — AB (ref 101–111)
CO2: 25 mmol/L (ref 22–32)
CREATININE: 1.44 mg/dL — AB (ref 0.61–1.24)
Calcium: 8.9 mg/dL (ref 8.9–10.3)
GFR calc non Af Amer: 44 mL/min — ABNORMAL LOW (ref >60)
GFR, EST AFRICAN AMERICAN: 51 mL/min — AB (ref >60)
Glucose, Bld: 104 mg/dL — ABNORMAL HIGH (ref 65–99)
POTASSIUM: 4.4 mmol/L (ref 3.5–5.1)
Sodium: 130 mmol/L — ABNORMAL LOW (ref 135–145)

## 2016-04-25 MED ORDER — FUROSEMIDE 40 MG PO TABS: 40.0000 mg | ORAL_TABLET | Freq: Every day | ORAL | 0 refills | Status: DC

## 2016-04-25 MED ORDER — POTASSIUM CHLORIDE CRYS ER 10 MEQ PO TBCR: 10.0000 meq | EXTENDED_RELEASE_TABLET | Freq: Every day | ORAL | 0 refills | Status: DC

## 2016-04-25 NOTE — Progress Notes (Signed)
Patient discharged to home with all his belongings.  Daughter here to pick up.  Escorted to the front entrance by staff via wheelchair.  No complaints at the time of discharge. Marcille Blanco, RN

## 2016-04-25 NOTE — Consult Note (Signed)
   [facility] CM Inpatient Consult   04/25/2016  VIRAAT VANPATTEN 1932/12/17 [id number]  Patient DC back home.  Called and spoke with inpatient RNCM regarding disposition and also called and left his daughter [name] a HIPAA compliant voicemail requesting a return call to follow up on any disposition needs. For questions, please contact:  Natividad Brood, RN BSN [facility] Liaison  [phone] business mobile phone Toll free office [phone]

## 2016-04-25 NOTE — Discharge Summary (Addendum)
Physician Discharge Summary  [name] KWI:[id number] DOB: 1932/05/02 DOA: 04/24/2016  PCP: [name], MD  Admit date: 04/24/2016 Discharge date: 04/25/2016  Recommendations for Outpatient Follow-up:  Please note that adjustment to lasix dose was made 40 mg a day instead of twice a day May continue potassium but at lower dose 10 meq a day Please follow up with PCP in about 1-2 weeks to recheck potassium and kidney function.  Spoke with patient daughter and made her aware of measuring BP and making sure it is 120/80 prior to administering BP meds   Discharge Diagnoses:  Principal Problem:   Hyperkalemia Active Problems:   S/P St Jude BiV ICD upgrade 2426   Systolic CHF, chronic (HCC)   Pacemaker   Chronic respiratory failure   Anemia   Chronic renal disease, stage III   Hypotension    Discharge Condition: stable   Diet recommendation: as tolerated   History of present illness:   Per HPI 04/24/2016 "83 y.o. male with medical history significant of systolic CHF, HTN, CAD, chronic respiratory failure on oxygen, COPD,  and DM type II;  who presents after having abnormal lab work of elevated potassium of 6.4. Patient had gone to his cardiologist's office today for routine follow-up and lab work was drawn at that time. During his evaluation with with Dr.Hochrein his systolic blood pressures were noted to be in the 70s, and he was reported to have become somnolent and nonresponsive. Patient was laid down with his feet elevated up with repeat systolic blood pressures noted be in the 90s. Patient was noted to respond quickly. At that time he was started on spironolactone and Cozaar. Otherwise patient denies any complaints of chest pain, nausea, vomiting, fever, orthopnea, shortness of breath, or loss of consciousness. ED Course: Upon admission to the emergency department patient was evaluated and seen have blood pressures as low as 75/42, all other vitals are relatively within normal  limits. Laboratory revealed WBC 5.5, hemoglobin 9.4, platelets 112, sodium 1:30, potassium 6, chloride 92, BUN 45, and creatinine 1.92. Patient was given calcium gluconate, insulin, glucose, and sodium bicarbonate. Significant EKG changes were noted. [facility] called to admit for further treatment of hyperkalemia."  Hospital Course:   Hyperkalemia - Likely from supplementation - Potassium dose reduced to 10 meq a day - He is on lasix so will need that potassium but at the lower dose - Lasix is 40 mg a day instead of twice a day  Essential hypertension - May resume home meds  Chronic atrial fibrillation - CHADS vasc score 5 - On anticoagulation with apixaban - Rate controlled with metoprolol    Code status: DNR/DNI Family communication: spoke with pt daughter over the phoen about d/c plan and meds on discharge   Signed:  Leisa Lenz, MD  [facility] 04/25/2016, 12:32 PM  Pager #: [phone]  Time spent in minutes: less than 30 minutes  Procedures:  None   Consultations:  None  Discharge Exam: Vitals:   04/25/16 0500 04/25/16 1039  BP: (!) 110/56 109/68  Pulse: (!) 59 61  Resp: 20 16  Temp: 97.9 F (36.6 C)    Vitals:   04/25/16 0010 04/25/16 0251 04/25/16 0500 04/25/16 1039  BP: (!) 116/58  (!) 110/56 109/68  Pulse: 65  (!) 59 61  Resp: '20  20 16  '$ Temp: 98.1 F (36.7 C)  97.9 F (36.6 C)   TempSrc: Oral  Oral   SpO2: 98%  98% 100%  Weight:  53.3 kg (  117 lb 6.4 oz)    Height:        General: Pt is alert, follows commands appropriately, not in acute distress Cardiovascular: Regular rate and rhythm, S1/S2 +, no murmurs Respiratory: Clear to auscultation bilaterally, no wheezing, no crackles, no rhonchi Abdominal: Soft, non tender, non distended, bowel sounds +, no guarding Extremities: no edema, no cyanosis, pulses palpable bilaterally DP and PT Neuro: Grossly nonfocal  Discharge Instructions  Discharge Instructions    Call MD for:  persistant  nausea and vomiting    Complete by:  As directed    Call MD for:  redness, tenderness, or signs of infection (pain, swelling, redness, odor or green/yellow discharge around incision site)    Complete by:  As directed    Call MD for:  severe uncontrolled pain    Complete by:  As directed    Diet - low sodium heart healthy    Complete by:  As directed    Discharge instructions    Complete by:  As directed    Please note that adjustment to lasix dose was made 40 mg a day instead of twice a day May continue potassium but at lower dose 10 meq a day Please follow up with PCP in about 1-2 weeks to recheck potassium and kidney function.   Increase activity slowly    Complete by:  As directed      Allergies as of 04/25/2016      Reactions   Digitek [digoxin] Other (See Comments)   Caused patient to have chest pains and dizzy spells   Aspirin Hives   Penicillins Rash   Rash Has patient had a PCN reaction causing immediate rash, facial/tongue/throat swelling, SOB or lightheadedness with hypotension:YES Has patient had a PCN reaction causing severe rash involving mucus membranes or skin necrosis: NO Has patient had a PCN reaction that required hospitalization NO Has patient had a PCN reaction occurring within the last 10 years:NO If all of the above answers are "NO", then may proceed with Cephalosporin use.      Medication List    STOP taking these medications   levofloxacin 750 MG tablet Commonly known as:  LEVAQUIN   predniSONE 20 MG tablet Commonly known as:  DELTASONE     TAKE these medications   albuterol (2.5 MG/3ML) 0.083% nebulizer solution Commonly known as:  PROVENTIL Take 2.5 mg by nebulization every 6 (six) hours as needed for wheezing or shortness of breath.   albuterol 108 (90 Base) MCG/ACT inhaler Commonly known as:  PROVENTIL HFA;VENTOLIN HFA Inhale 2 puffs into the lungs every 4 (four) hours as needed for wheezing or shortness of breath.   allopurinol 300 MG  tablet Commonly known as:  ZYLOPRIM Take 300 mg by mouth daily.   CALCIUM-VITAMIN D PO Take 1 tablet by mouth 2 (two) times daily.   ELIQUIS 2.5 MG Tabs tablet Generic drug:  apixaban Take 1 tablet by mouth 2 (two) times daily.   Fish Oil 1000 MG Caps Take 1,000 mg by mouth at bedtime.   furosemide 40 MG tablet Commonly known as:  LASIX Take 1 tablet (40 mg total) by mouth daily. What changed:  when to take this   hydrocortisone cream 1 % Apply to affected area 2 times daily What changed:  how much to take  how to take this  when to take this  reasons to take this  additional instructions   isosorbide mononitrate 30 MG 24 hr tablet Commonly known as:  IMDUR Take  30 mg by mouth daily.   metoprolol succinate 25 MG 24 hr tablet Commonly known as:  TOPROL-XL Take 25 mg by mouth daily.   nitroGLYCERIN 0.4 MG SL tablet Commonly known as:  NITROSTAT Place 0.4 mg under the tongue every 5 (five) minutes as needed for chest pain.   omeprazole 40 MG capsule Commonly known as:  PRILOSEC Take 1 capsule (40 mg total) by mouth 2 (two) times daily.   OXYGEN Inhale 2.5 L into the lungs continuous.   potassium chloride 10 MEQ tablet Commonly known as:  K-DUR,KLOR-CON Take 1 tablet (10 mEq total) by mouth daily. What changed:  medication strength  how much to take   vitamin B-12 1000 MCG tablet Commonly known as:  CYANOCOBALAMIN Take 1,000 mcg by mouth 2 (two) times daily.      Follow-up Information    [name], MD. Schedule an appointment as soon as possible for a visit in 2 week(s).   Specialty:  Internal Medicine Contact information: [place] [address] [phone]        Leisa Lenz, MD Follow up.   Specialty:  Internal Medicine Why:  please call me if any questions or concenrs post discharge my cell number is [phone] Contact information: [place] [address] [phone]             The results of significant diagnostics from this hospitalization (including imaging, microbiology, ancillary and laboratory) are listed below for reference.    Significant Diagnostic Studies: Dg Chest 2 View  Result Date: 04/02/2016 CLINICAL DATA:  Cough, shortness of breath, history of CHF, COPD, and black lung disease. Previous CABG. ICD. EXAM: CHEST  2 VIEW FINDINGS: The lungs are adequately inflated. The interstitial infiltrates observed previously are now more alveolar in nature. The cardiac silhouette remains enlarged. The pulmonary vascularity is more engorged today. There is blunting of the left costophrenic angles. The ICD is in stable position. There is calcification in the wall of the aortic arch. The sternal wires are intact. A unused right ventricular electrode is again demonstrated. There are degenerative changes of both shoulders. There is moderate multilevel degenerative disc disease of the thoracic spine. IMPRESSION: CHF with interstitial and alveolar opacities superimposed upon chronic interstitial changes. Electronically Signed   By: David  Martinique M.D.   On: 04/02/2016 09:58   Dg Chest 2 View  Result Date: 03/29/2016 CLINICAL DATA:  Shortness of breath.  Productive cough for 1 month. EXAM: CHEST  2 VIEW COMPARISON:  March 21, 2016 FINDINGS: No pneumothorax. Stable AICD device. Stable cardiomegaly. Stable interstitial markings in the lungs and small left pleural effusion. No significant interval change. IMPRESSION: 1. No interval change. Stable small left effusion. Interstitial prominence worsened since March of 2017 but stable since March 21, 2016. I suspect a component of edema. Electronically Signed   By: Dorise Bullion III M.D   On: 03/29/2016 15:28    Microbiology: No results found for this or any previous visit (from the past 240 hour(s)).   Labs: Basic Metabolic Panel:  Recent Labs Lab 04/23/16 1212 04/24/16 0029 04/24/16 0051 04/24/16 0302 04/25/16 0357   NA 129* 130* 128* 133* 130*  K 6.4* 6.0* 5.8* 4.4 4.4  CL 92* 92* 93* 92* 94*  CO2 24 25  --  27 25  GLUCOSE 86 94 93 51* 104*  BUN 46* 45* 45* 42* 37*  CREATININE 1.83* 1.92* 2.00* 1.83* 1.44*  CALCIUM 9.3 9.6  --  10.7* 8.9   Liver Function Tests: No results for input(s): AST, ALT, ALKPHOS, BILITOT, PROT, ALBUMIN in the last 168 hours. No results for input(s): LIPASE, AMYLASE in the last 168 hours. No results for input(s): AMMONIA in the last 168 hours. CBC:  Recent Labs Lab 04/24/16 0038 04/24/16 0051 04/24/16 0302 04/25/16 0357  WBC 5.5  --  5.2 3.1*  NEUTROABS 3.3  --   --   --   HGB 9.4* 10.2* 9.0* 8.5*  HCT 28.5* 30.0* 27.4* 26.1*  MCV 96.3  --  95.5 95.6  PLT 112*  --  94* 98*   Cardiac Enzymes: No results for input(s): CKTOTAL, CKMB, CKMBINDEX, TROPONINI in the last 168 hours. BNP: BNP (last 3 results)  Recent Labs  03/21/16 1816 03/29/16 1951 04/02/16 0925  BNP 2,909.3* 2,710.1* 3,007.2*    ProBNP (last 3 results) No results for input(s): PROBNP in the last 8760 hours.  CBG: No results for input(s): GLUCAP in the last 168 hours.

## 2016-04-26 ENCOUNTER — Other Ambulatory Visit: Payer: Self-pay

## 2016-04-26 NOTE — Consult Note (Signed)
Called daughter [name] @ [phone]. HIPAA verified.  Dawn states that the patient was seen by [facility] and they are providing an aide, setting up his schedule for visits for any labs, blood pressure checks.  She states they are taking over the case.  Informed her she will gett full care management services under Hospice and [facility] will close from active.  She states she was appreciative of everything.  Will update [facility] Care Management staff of case closure.  For questions, please contact:  Natividad Brood, RN BSN [place] [facility] Liaison  [phone] business mobile phone Toll free office [phone]

## 2016-04-26 NOTE — Patient Outreach (Signed)
Case closure: Per hospital liaison patient discharged with hospice care.   PLAN: will close case as needs will be meet by hospice.  Will notify MD and send case closure letter to patient.   [name], RN, BSN, CEN [facility] [employer] [phone]

## 2016-05-02 ENCOUNTER — Other Ambulatory Visit (HOSPITAL_COMMUNITY): Payer: Self-pay

## 2016-05-02 DIAGNOSIS — D649 Anemia, unspecified: Secondary | ICD-10-CM | POA: Diagnosis not present

## 2016-05-02 DIAGNOSIS — I10 Essential (primary) hypertension: Secondary | ICD-10-CM | POA: Diagnosis not present

## 2016-05-02 NOTE — Progress Notes (Signed)
    Social History   Social History  . Marital status: Widowed    Spouse name: N/A  . Number of children: N/A  . Years of education: N/A   Occupational History  . retired Retired    Equities trader   Social History Main Topics  . Smoking status: Former Smoker    Types: Cigarettes    Quit date: 03/12/1949  . Smokeless tobacco: Current User    Types: Chew  . Alcohol use No     Comment: drank until per pt 03/19/11, h/o heavy use  . Drug use: No  . Sexual activity: Yes   Other Topics Concern  . Not on file   Social History Narrative  . No narrative on file    Physical Exam      Future Appointments Date Time Provider [place]  05/28/2016 10:15 AM [facility] DEVICE REMOTES [facility]  07/30/2016 9:40 AM Sanda Klein, MD [facility]    Pt refused Paramedicine program. Pt is being referred to hospice and will not need "any other help" per him and his daughter.

## 2016-05-07 DIAGNOSIS — I5020 Unspecified systolic (congestive) heart failure: Secondary | ICD-10-CM | POA: Diagnosis not present

## 2016-05-07 DIAGNOSIS — I10 Essential (primary) hypertension: Secondary | ICD-10-CM | POA: Diagnosis not present

## 2016-05-07 DIAGNOSIS — Z0000 Encounter for general adult medical examination without abnormal findings: Secondary | ICD-10-CM | POA: Diagnosis not present

## 2016-05-07 DIAGNOSIS — E119 Type 2 diabetes mellitus without complications: Secondary | ICD-10-CM | POA: Diagnosis not present

## 2016-05-07 DIAGNOSIS — D649 Anemia, unspecified: Secondary | ICD-10-CM | POA: Diagnosis not present

## 2016-05-24 ENCOUNTER — Telehealth (HOSPITAL_COMMUNITY): Payer: Self-pay

## 2016-05-24 NOTE — Telephone Encounter (Signed)
I called to see if pt re-considered CHP visits.  Pt stated that he "still don't won't anybody coming to his house".

## 2016-05-28 ENCOUNTER — Ambulatory Visit (INDEPENDENT_AMBULATORY_CARE_PROVIDER_SITE_OTHER): Admitting: *Deleted

## 2016-05-28 DIAGNOSIS — I255 Ischemic cardiomyopathy: Secondary | ICD-10-CM

## 2016-05-28 NOTE — Progress Notes (Signed)
Remote pacemaker transmission.   

## 2016-05-30 ENCOUNTER — Encounter: Payer: Self-pay | Admitting: Cardiology

## 2016-05-30 LAB — CUP PACEART REMOTE DEVICE CHECK
Battery Remaining Longevity: 62 mo
Battery Remaining Percentage: 95.5 %
Date Time Interrogation Session: 20180319075258
Implantable Lead Implant Date: 20050706
Implantable Lead Implant Date: 20080423
Implantable Pulse Generator Implant Date: 20150923
Lead Channel Impedance Value: 510 Ohm
Lead Channel Pacing Threshold Amplitude: 0.5 V
Lead Channel Pacing Threshold Pulse Width: 0.5 ms
Lead Channel Sensing Intrinsic Amplitude: 5.8 mV
Lead Channel Setting Pacing Pulse Width: 0.8 ms
Lead Channel Setting Sensing Sensitivity: 2 mV
MDC IDC LEAD LOCATION: 753858
MDC IDC LEAD LOCATION: 753860
MDC IDC MSMT BATTERY VOLTAGE: 2.96 V
MDC IDC MSMT LEADCHNL LV PACING THRESHOLD AMPLITUDE: 2.25 V
MDC IDC MSMT LEADCHNL LV PACING THRESHOLD PULSEWIDTH: 0.8 ms
MDC IDC MSMT LEADCHNL RV IMPEDANCE VALUE: 450 Ohm
MDC IDC PG SERIAL: 7634311
MDC IDC SET LEADCHNL LV PACING AMPLITUDE: 3.25 V
MDC IDC SET LEADCHNL RV PACING AMPLITUDE: 2.5 V
MDC IDC SET LEADCHNL RV PACING PULSEWIDTH: 0.5 ms

## 2016-06-10 IMAGING — DX DG RIBS W/ CHEST 3+V*R*
5 series · 5 of 5 positions shown · non-contrast
Comparison: Chest x-ray 11/05/2014.

CLINICAL DATA: Chest pain and shortness of breath since this
morning.

EXAM:
RIGHT RIBS AND CHEST - 3+ VIEW

[chest pa]
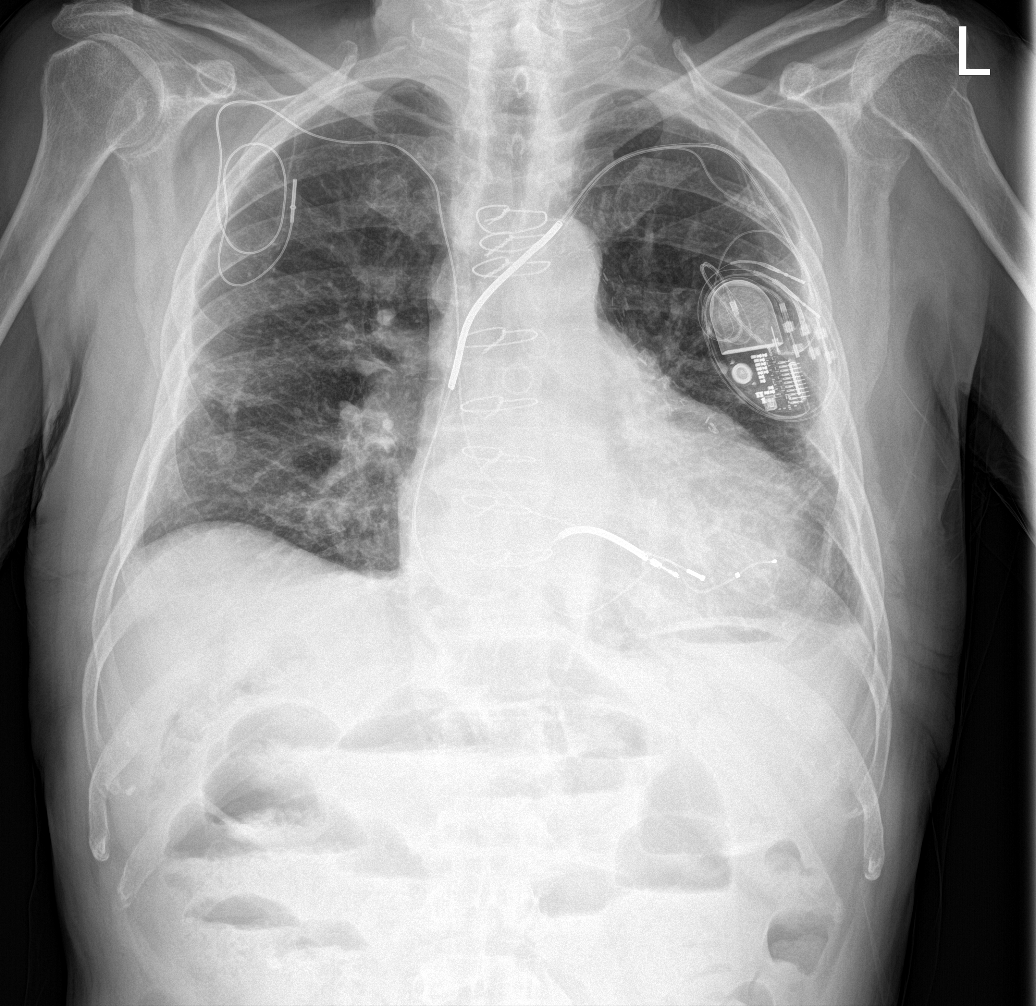

[rib pa]
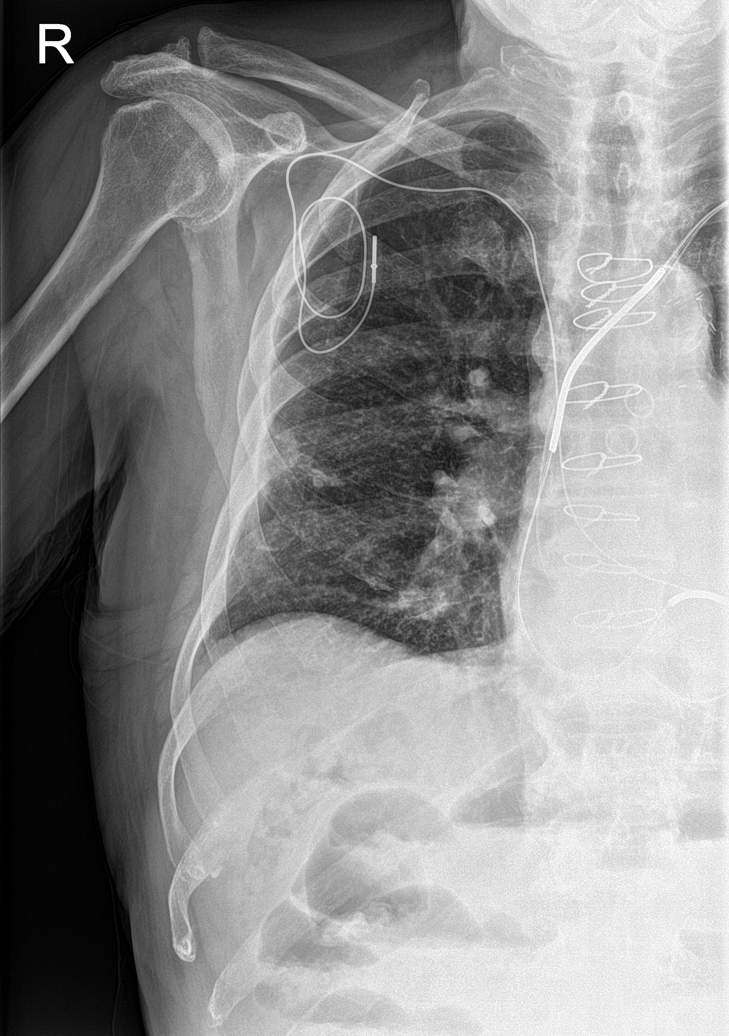

[rib pa obl (1 of 3)]
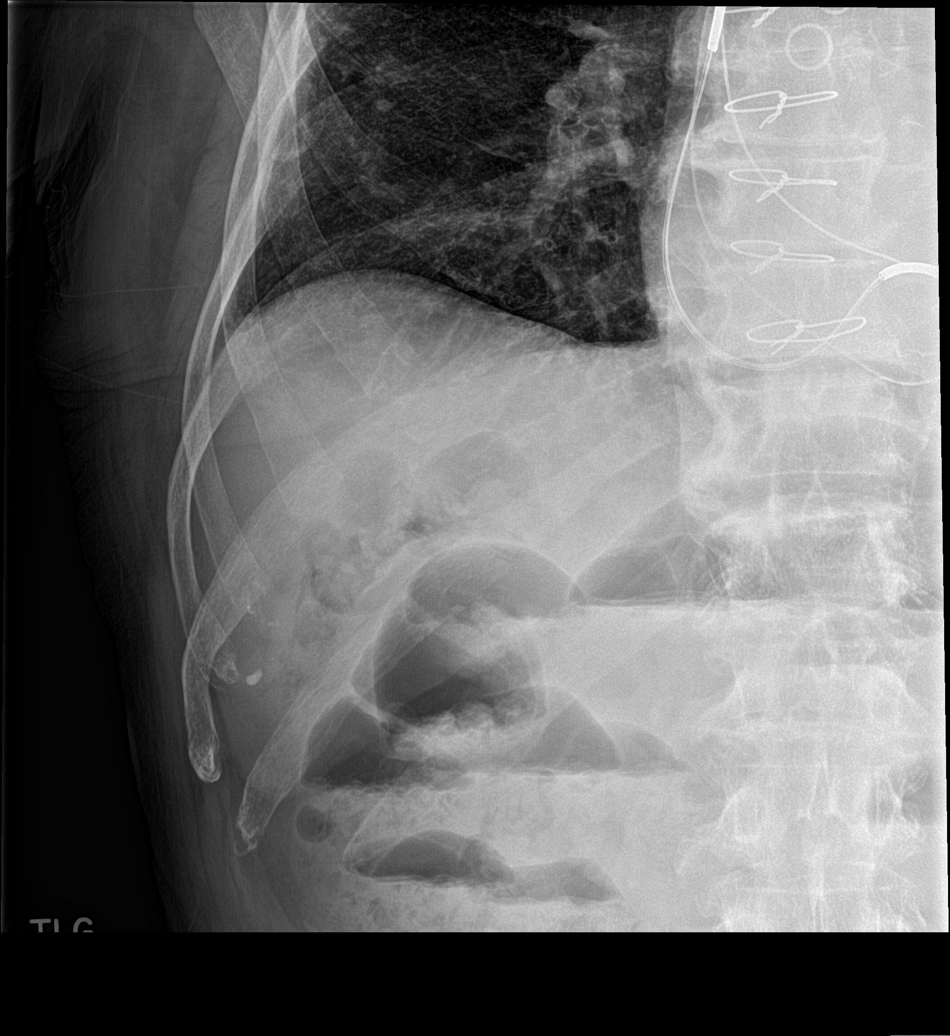

[rib pa obl (2 of 3)]
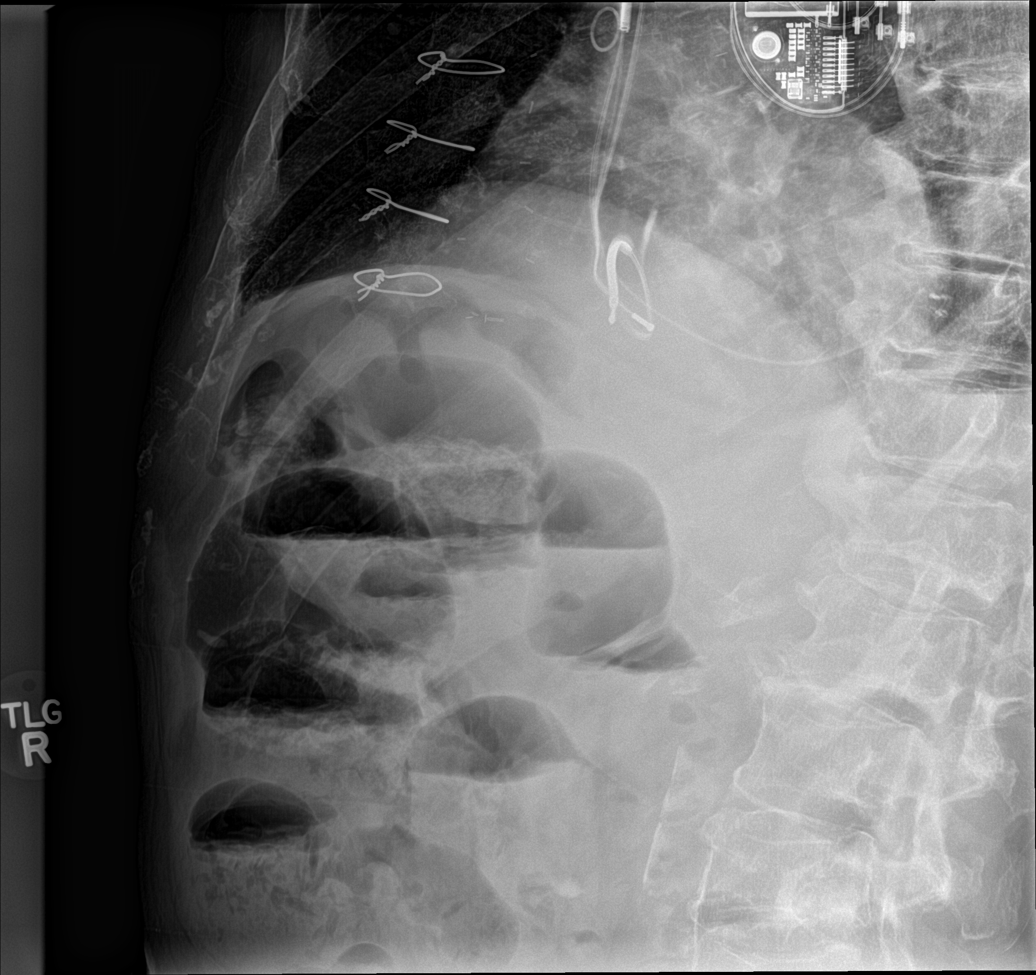

[rib pa obl (3 of 3)]
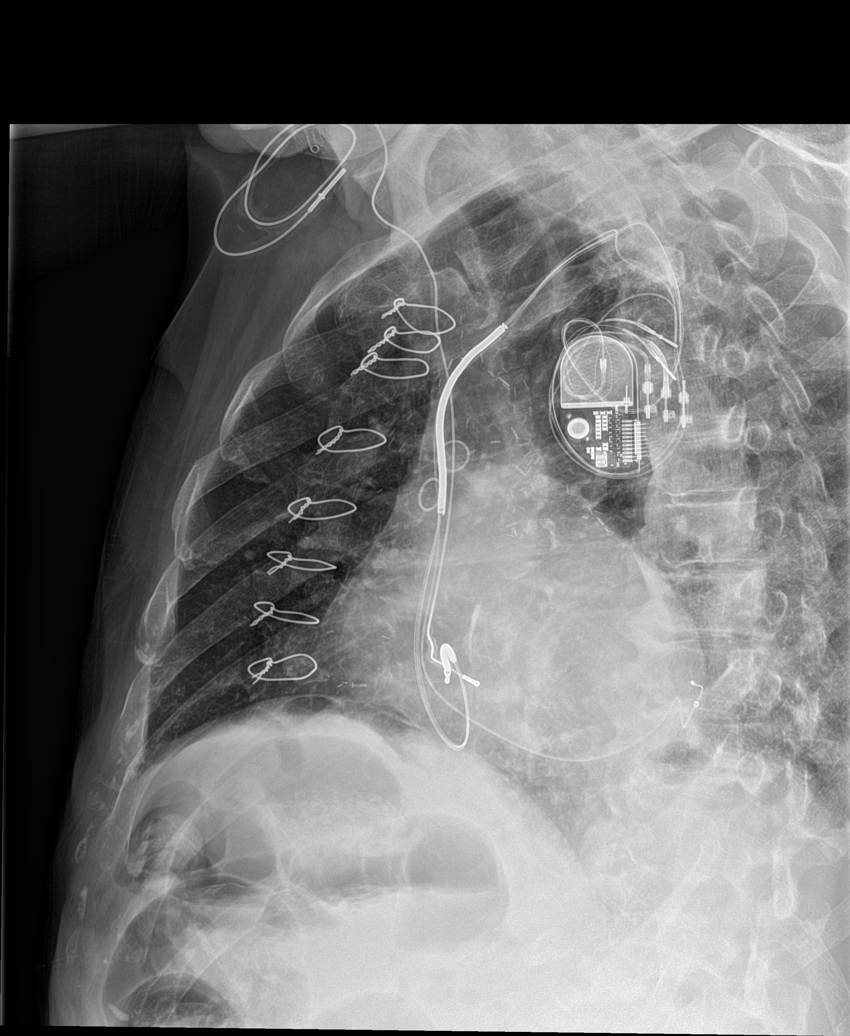

[5 of 5 positions shown; findings below may reference images not displayed]

FINDINGS: The heart is enlarged but stable. Stable surgical changes from
bypass surgery. The pacer wires and AICD are stable. The lungs
demonstrate chronic bronchitic type interstitial changes and
scarring. No definite infiltrates or effusions. Stable right lower
lobe scarring changes. a No pneumothorax.

Dedicated views of the right ribs do not demonstrate any definite
acute right-sided rib fractures.

Air-filled small bowel loops are noted in the abdomen with air-fluid
levels which could suggest a small bowel obstruction. Recommend
clinical correlation.
IMPRESSION: Chronic lung changes and stable cardiac enlargement. No definite
acute pulmonary findings.

Air-filled small bowel loops in the upper abdomen with air-fluid
levels may suggest a small bowel obstruction.

No definite acute right-sided rib fractures.

## 2016-06-10 IMAGING — CT CT ABD-PELV W/ CM
2 of 5 series · 15 of 46 positions shown, 17 images · IV contrast (Omni 300)
Comparison: 12/19/2013

CLINICAL DATA: Right upper quadrant pain and rib pain.

EXAM:
CT ABDOMEN AND PELVIS WITH CONTRAST
TECHNIQUE: Multidetector CT imaging of the abdomen and pelvis was performed
using the standard protocol following bolus administration of
intravenous contrast.
CONTRAST:  100mL OMNIPAQUE IOHEXOL 300 MG/ML  SOLN

[Series 2: abd/ pelvis 5.0 i30f 1 · axial · 0.70mm/px · z∈[-1023,-598]mm · 12 of 96 slices shown, 14 images]
[im 6/96  soft-tissue]
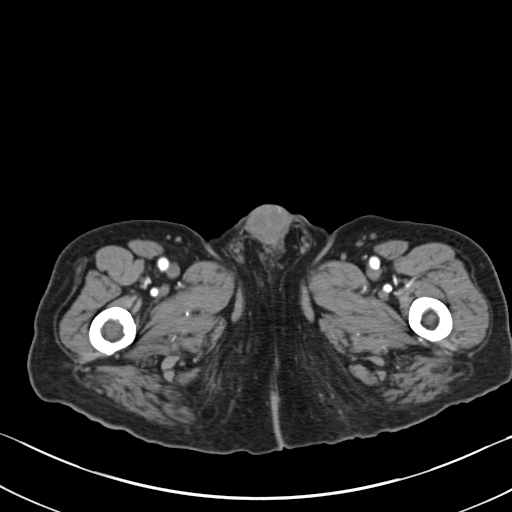
[im 6/96  bone]
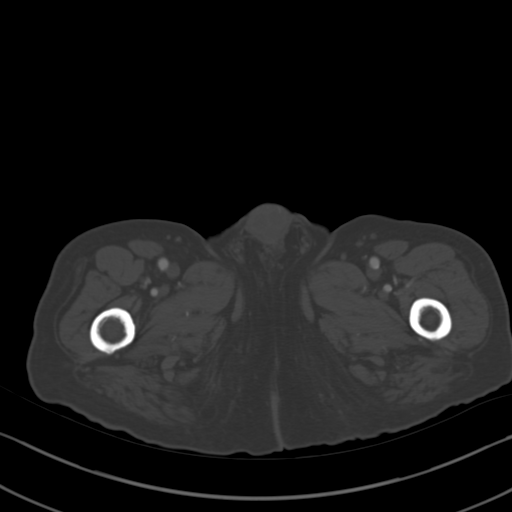
[im 16/96  soft-tissue]
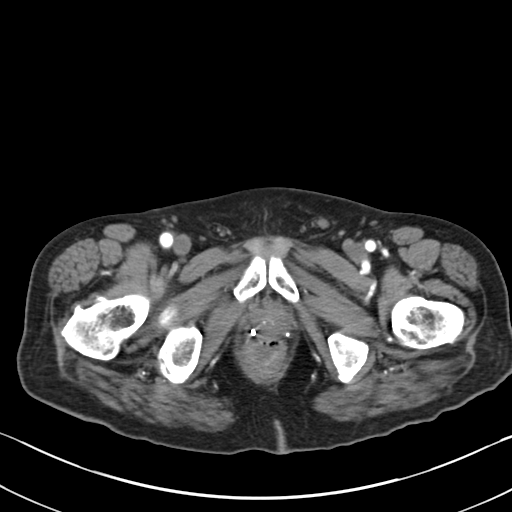
[im 21/96  soft-tissue]
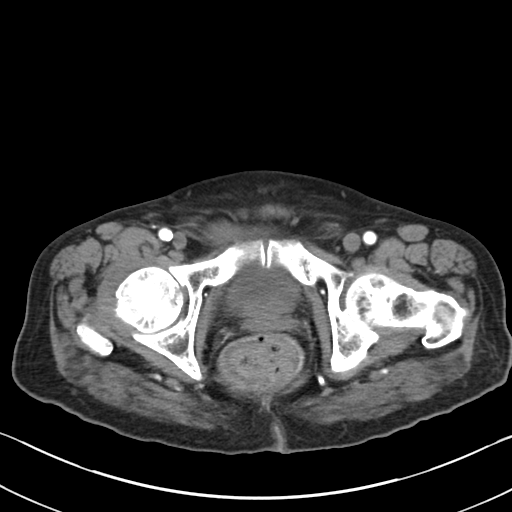
[im 31/96  soft-tissue]
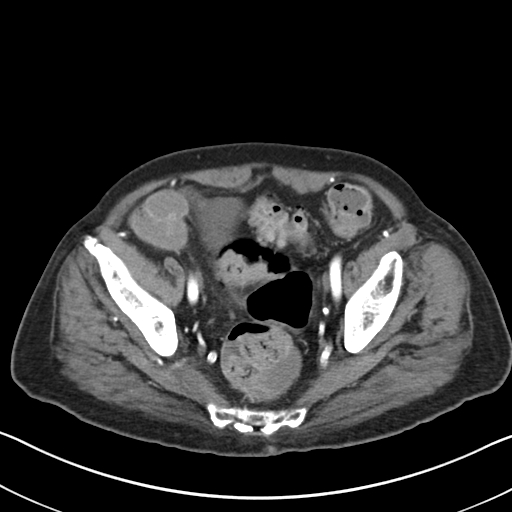
[im 36/96  soft-tissue]
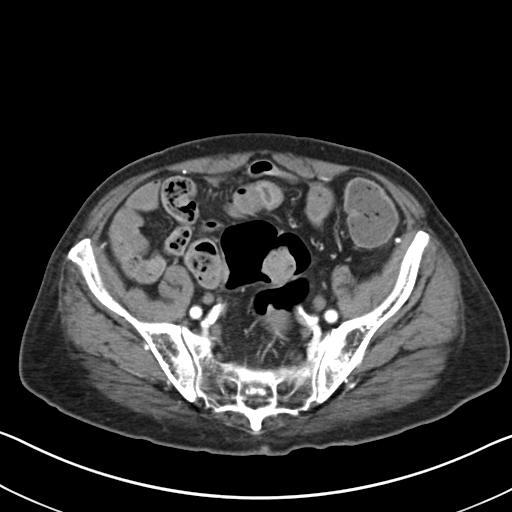
[im 46/96  soft-tissue]
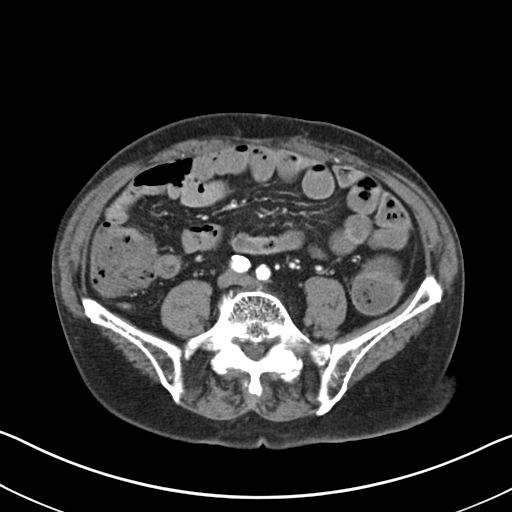
[im 51/96  soft-tissue]
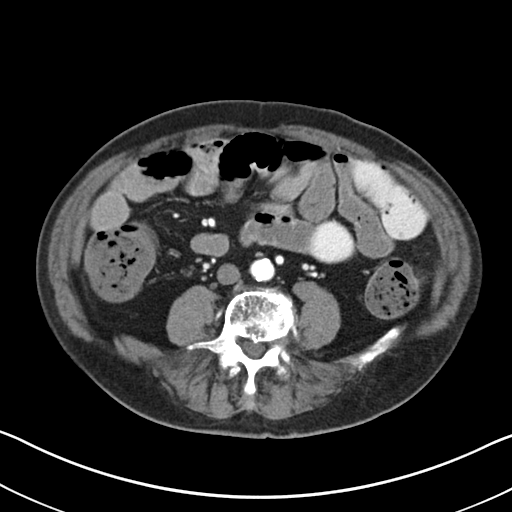
[im 61/96  soft-tissue]
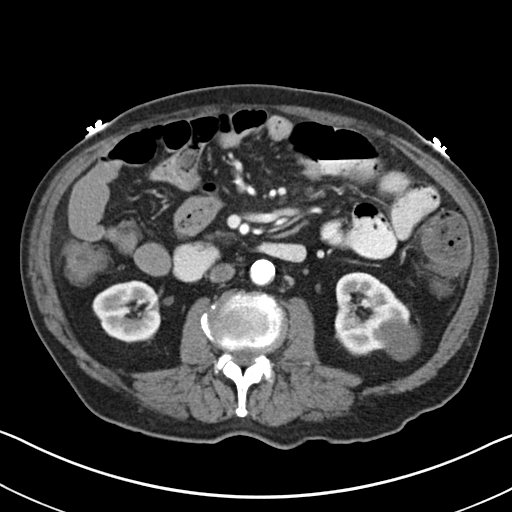
[im 66/96  soft-tissue]
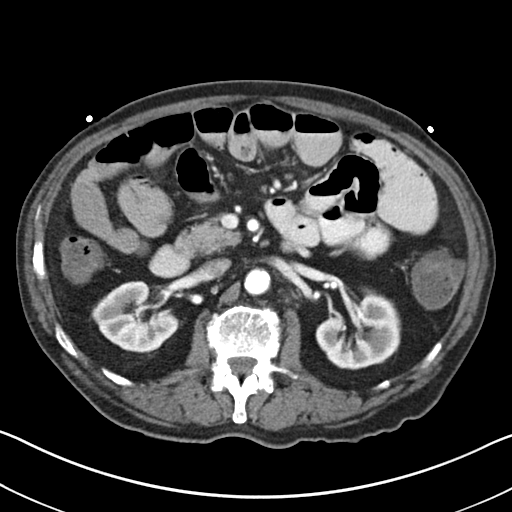
[im 66/96  bone]
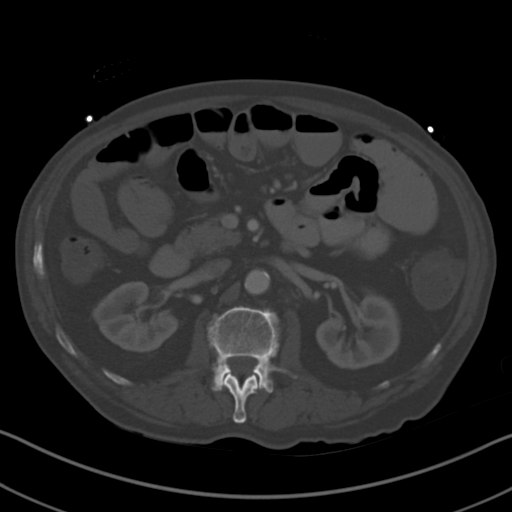
[im 76/96  soft-tissue]
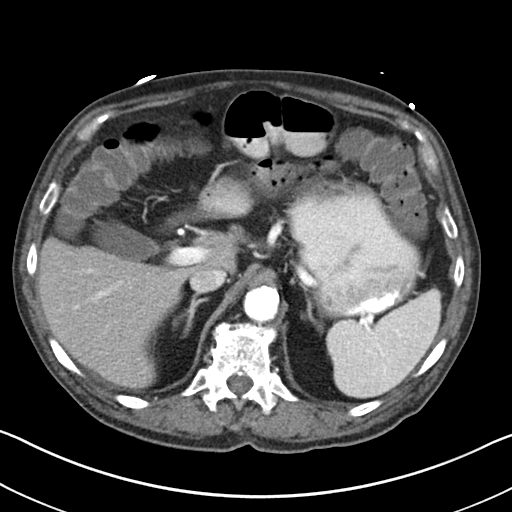
[im 81/96  soft-tissue]
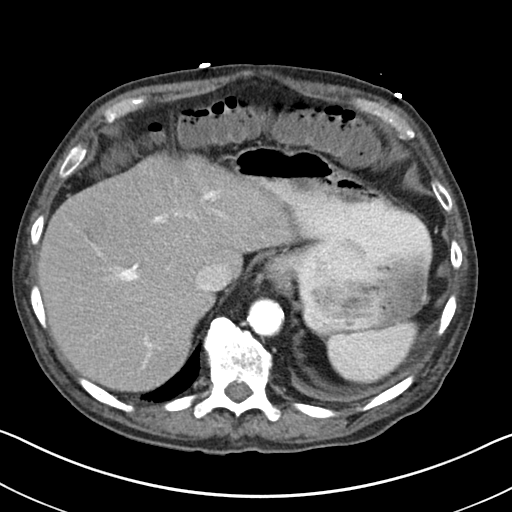
[im 91/96  soft-tissue]
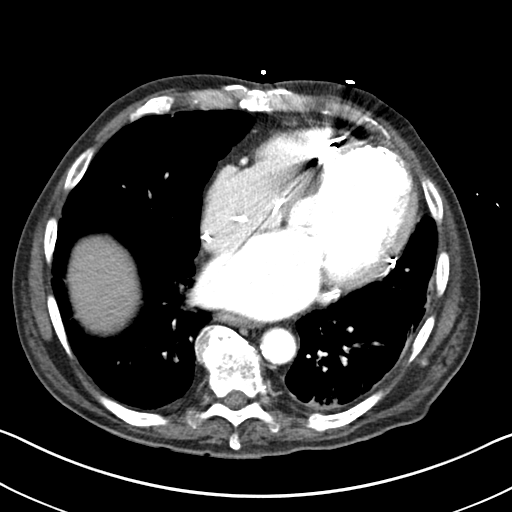

[Series 5: coronals · coronal · 0.70mm/px · 3 of 130 slices shown]
[im 44/130  soft-tissue]
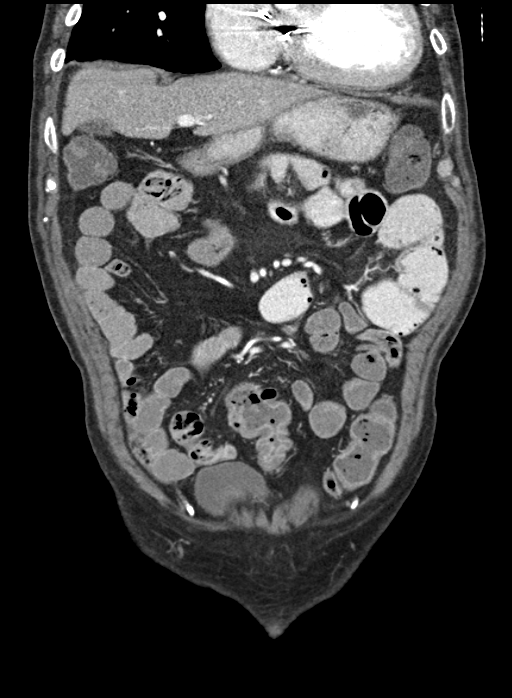
[im 58/130  soft-tissue]
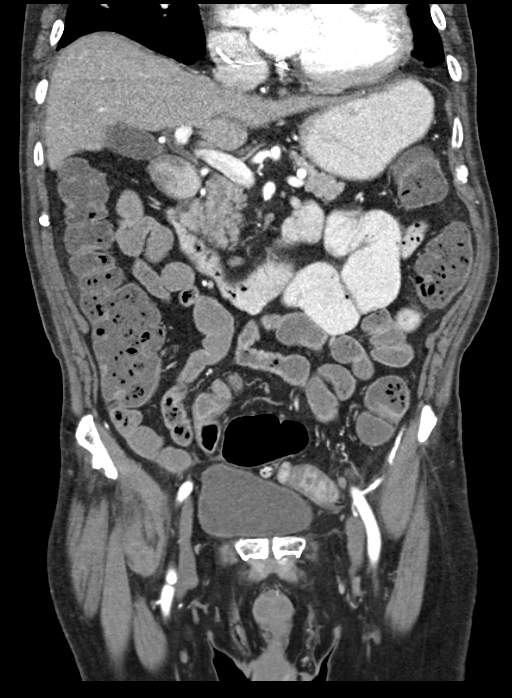
[im 72/130  soft-tissue]
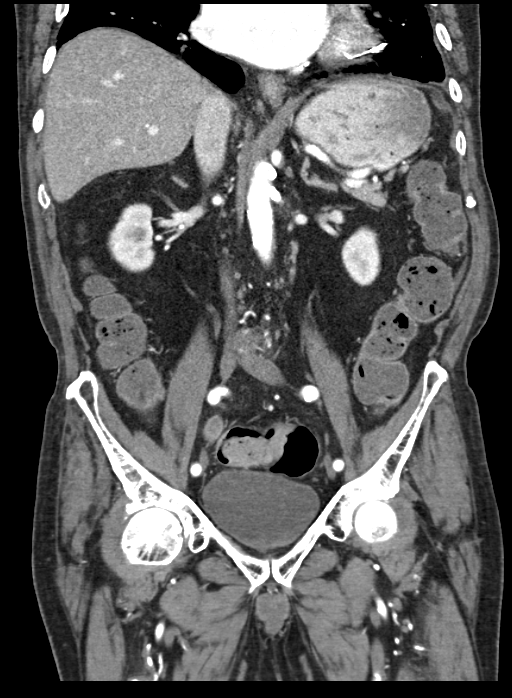

[15 of 46 positions shown; findings below may reference images not displayed]

FINDINGS: Lower chest: Pleural calcification and thickening at the posterior
left lung base is identified, image 11/ series 2. Similar to
previous exam. No pleural effusions identified. The heart size
appears enlarged.

Hepatobiliary: No focal liver abnormality. The gallbladder is
normal. No biliary dilatation.

Pancreas: The pancreas appears within normal limits.

Spleen: Normal appearance of the spleen.

Adrenals/Urinary Tract: The adrenal glands are within normal limits.
Stone within the inferior pole of the right kidney measures 9 mm,
image 38/series 2. No right hydronephrosis identified. Intermediate
attenuating cyst containing mural calcification is identified
measuring 3.3 cm. There are is a hyperdense nodule along the
inferior aspect of this cyst with equivocal enhancement, image 102
of series 5. The urinary bladder is unremarkable.

Stomach/Bowel: The stomach is within normal limits. There are
prominent loops of proximal small bowel which measure up to 2.8 cm,
image number 41/series 2. The distal small bowel loops have a normal
caliber. The transition to normal caliber distal small bowel loops
is identified on image number 49 of series 2. No obstruction.
Sigmoid colon diverticulosis noted. No acute inflammation.

Vascular/Lymphatic: Calcified atherosclerotic disease involves the
abdominal aorta. No aneurysm. No enlarged retroperitoneal or
mesenteric adenopathy. No enlarged pelvic or inguinal lymph nodes.

Reproductive: Seed implants are noted within the prostate gland.
Seminal vesicles are unremarkable.

Other: No free fluid or fluid collections identified within the
abdomen or pelvis.

Musculoskeletal: Facet hypertrophy and degenerative changes noted
within the lumbar spine.
IMPRESSION: 1. Mild increase caliber of the proximal and mid small bowel loops
with transition to normal caliber distal small bowel. The appearance
is nonspecific. Differential considerations include focal ileus
versus low grade partial obstruction. Clinical correlation advise.
2. Aortic atherosclerosis.
3. Mildly complex cyst containing mural calcification and possible
enhancing mural nodule is associated with the left kidney. Followup,
nonemergent contrast enhanced renal protocol MRI is recommended.
4. Nonobstructing right renal calculus.
5. Chronic left posterior pleural thickening and calcification
noted.

## 2016-06-13 ENCOUNTER — Encounter: Payer: Self-pay | Admitting: Cardiology

## 2016-07-06 ENCOUNTER — Encounter (HOSPITAL_COMMUNITY): Payer: Self-pay | Admitting: Emergency Medicine

## 2016-07-06 ENCOUNTER — Emergency Department (HOSPITAL_COMMUNITY)
Admission: EM | Admit: 2016-07-06 | Discharge: 2016-07-07 | Disposition: A | Discharge status: Home / Self Care | Attending: Emergency Medicine | Admitting: Emergency Medicine

## 2016-07-06 ENCOUNTER — Emergency Department (HOSPITAL_COMMUNITY)

## 2016-07-06 DIAGNOSIS — Z955 Presence of coronary angioplasty implant and graft: Secondary | ICD-10-CM | POA: Diagnosis not present

## 2016-07-06 DIAGNOSIS — Z79899 Other long term (current) drug therapy: Secondary | ICD-10-CM | POA: Diagnosis not present

## 2016-07-06 DIAGNOSIS — Z950 Presence of cardiac pacemaker: Secondary | ICD-10-CM | POA: Diagnosis not present

## 2016-07-06 DIAGNOSIS — I130 Hypertensive heart and chronic kidney disease with heart failure and stage 1 through stage 4 chronic kidney disease, or unspecified chronic kidney disease: Secondary | ICD-10-CM | POA: Diagnosis not present

## 2016-07-06 DIAGNOSIS — J449 Chronic obstructive pulmonary disease, unspecified: Secondary | ICD-10-CM | POA: Diagnosis not present

## 2016-07-06 DIAGNOSIS — Z87891 Personal history of nicotine dependence: Secondary | ICD-10-CM | POA: Diagnosis not present

## 2016-07-06 DIAGNOSIS — Z8546 Personal history of malignant neoplasm of prostate: Secondary | ICD-10-CM | POA: Diagnosis not present

## 2016-07-06 DIAGNOSIS — K529 Noninfective gastroenteritis and colitis, unspecified: Secondary | ICD-10-CM | POA: Diagnosis not present

## 2016-07-06 DIAGNOSIS — I2510 Atherosclerotic heart disease of native coronary artery without angina pectoris: Secondary | ICD-10-CM | POA: Diagnosis not present

## 2016-07-06 DIAGNOSIS — I5043 Acute on chronic combined systolic (congestive) and diastolic (congestive) heart failure: Secondary | ICD-10-CM | POA: Diagnosis not present

## 2016-07-06 DIAGNOSIS — N183 Chronic kidney disease, stage 3 (moderate): Secondary | ICD-10-CM | POA: Insufficient documentation

## 2016-07-06 DIAGNOSIS — Z85118 Personal history of other malignant neoplasm of bronchus and lung: Secondary | ICD-10-CM | POA: Diagnosis not present

## 2016-07-06 DIAGNOSIS — E1122 Type 2 diabetes mellitus with diabetic chronic kidney disease: Secondary | ICD-10-CM | POA: Insufficient documentation

## 2016-07-06 DIAGNOSIS — Z8673 Personal history of transient ischemic attack (TIA), and cerebral infarction without residual deficits: Secondary | ICD-10-CM | POA: Insufficient documentation

## 2016-07-06 DIAGNOSIS — Z7901 Long term (current) use of anticoagulants: Secondary | ICD-10-CM | POA: Diagnosis not present

## 2016-07-06 DIAGNOSIS — Z951 Presence of aortocoronary bypass graft: Secondary | ICD-10-CM | POA: Insufficient documentation

## 2016-07-06 DIAGNOSIS — R1084 Generalized abdominal pain: Secondary | ICD-10-CM | POA: Diagnosis present

## 2016-07-06 LAB — COMPREHENSIVE METABOLIC PANEL
ALBUMIN: 3.7 g/dL (ref 3.5–5.0)
ALK PHOS: 133 U/L — AB (ref 38–126)
ALT: 17 U/L (ref 17–63)
ANION GAP: 18 — AB (ref 5–15)
AST: 22 U/L (ref 15–41)
BILIRUBIN TOTAL: 1.1 mg/dL (ref 0.3–1.2)
BUN: 16 mg/dL (ref 6–20)
CALCIUM: 9.4 mg/dL (ref 8.9–10.3)
CO2: 23 mmol/L (ref 22–32)
Chloride: 97 mmol/L — ABNORMAL LOW (ref 101–111)
Creatinine, Ser: 1.27 mg/dL — ABNORMAL HIGH (ref 0.61–1.24)
GFR calc Af Amer: 59 mL/min — ABNORMAL LOW (ref >60)
GFR, EST NON AFRICAN AMERICAN: 51 mL/min — AB (ref >60)
GLUCOSE: 135 mg/dL — AB (ref 65–99)
POTASSIUM: 3.9 mmol/L (ref 3.5–5.1)
Sodium: 138 mmol/L (ref 135–145)
TOTAL PROTEIN: 8 g/dL (ref 6.5–8.1)

## 2016-07-06 LAB — CBC
HCT: 29.2 % — ABNORMAL LOW (ref 39.0–52.0)
HEMOGLOBIN: 9 g/dL — AB (ref 13.0–17.0)
MCH: 30.4 pg (ref 26.0–34.0)
MCHC: 30.8 g/dL (ref 30.0–36.0)
MCV: 98.6 fL (ref 78.0–100.0)
Platelets: 108 10*3/uL — ABNORMAL LOW (ref 150–400)
RBC: 2.96 MIL/uL — ABNORMAL LOW (ref 4.22–5.81)
RDW: 19.2 % — ABNORMAL HIGH (ref 11.5–15.5)
WBC: 3.1 10*3/uL — AB (ref 4.0–10.5)

## 2016-07-06 LAB — LIPASE, BLOOD: Lipase: 51 U/L (ref 11–51)

## 2016-07-06 NOTE — ED Notes (Signed)
pts family member to nurse first stating she was upset with the wait time, states that if pt is not called back soon shes "going to loose it" This EMT informed the family member that he would be called back soon, there were 2 people infront of pt.

## 2016-07-06 NOTE — ED Triage Notes (Signed)
Pt c/o 6/10 right lower abd pain since this morning, very nauseated unable to bring nothing up, pt feels SOB, denies any cp. Home O2 dependent at 2L [name]. Pt AO x 3 NAD noticed.

## 2016-07-07 ENCOUNTER — Emergency Department (HOSPITAL_COMMUNITY)

## 2016-07-07 LAB — URINALYSIS, ROUTINE W REFLEX MICROSCOPIC
BILIRUBIN URINE: NEGATIVE
Bacteria, UA: NONE SEEN
Glucose, UA: NEGATIVE mg/dL
Ketones, ur: NEGATIVE mg/dL
Leukocytes, UA: NEGATIVE
NITRITE: NEGATIVE
PH: 5 (ref 5.0–8.0)
Protein, ur: 100 mg/dL — AB
Squamous Epithelial / LPF: NONE SEEN

## 2016-07-07 MED ORDER — ONDANSETRON 4 MG PO TBDP: 4.0000 mg | ORAL_TABLET | Freq: Three times a day (TID) | ORAL | 0 refills | Status: AC | PRN

## 2016-07-07 MED ORDER — ONDANSETRON HCL 4 MG/2ML IJ SOLN
4.0000 mg | Freq: Once | INTRAMUSCULAR | Status: AC
  Administered 2016-07-07: 4 mg via INTRAVENOUS
  Filled 2016-07-07: qty 2

## 2016-07-07 MED ORDER — IOPAMIDOL (ISOVUE-300) INJECTION 61%
INTRAVENOUS | Status: AC
  Administered 2016-07-07: 100 mL
  Filled 2016-07-07: qty 100

## 2016-07-07 MED ORDER — FENTANYL CITRATE (PF) 100 MCG/2ML IJ SOLN
50.0000 ug | Freq: Once | INTRAMUSCULAR | Status: AC
  Administered 2016-07-07: 50 ug via INTRAVENOUS
  Filled 2016-07-07: qty 2

## 2016-07-07 NOTE — ED Provider Notes (Signed)
[place] DEPT Provider Note   CSN: [id number] Arrival date & time: 07/06/16  2033 By signing my name below, I, Gaspar Cola, attest that this documentation has been prepared under the direction and in the presence of Everlene Balls, MD . Electronically Signed: Gaspar Cola Scribe. 07/07/2016. 12:47 AM   History   Chief Complaint Chief Complaint  Patient presents with  . Abdominal Pain  . Nausea  . Shortness of Breath   HPI Comments:  [name] is a 83 y.o. male with a hx of GERD, COPD, CVA, HTN, DM, and prostate cancer who presents to the Emergency Department complaining of constant, moderate, burning generalized BLQ abdominal pain onset at 1:30 this pm. He reports associated SOB, nausea and vomiting which he describes as yellow and green stomach contents. No alleviating or modifying factors noted. Per pt, his prostate cancer is in remission. Pt denies any diarrhea, CP, or fever and has no other acute complaints at this time.  Nothing makes his symptoms better or worse.  The history is provided by the patient. No language interpreter was used.   Past Medical History:  Diagnosis Date  . Arthritis   . Black lung disease ([place])   . CAD (coronary artery disease)    a. 07/2002 CABG x 3: LIMA->LAD, VG->Diag, VG->OM;  b. 06/2006 Cath: LM 50-60ost/p, LAD patent mid stent, D1 sev dzs, D2 patent stent, LCX nl, OM2 sev sten prox, RCA large/nl, VG->Diag nl, VG->OM nl, LIMA->LAD atretic, EF 30%.  . Carotid artery occlusion   . Chronic atrial fibrillation ([place])   . Chronic respiratory failure ([place])   . Chronic systolic CHF (congestive heart failure) ([place])    a. 12/2012 Echo: EF 20-25%, mid-dist antsept AK, mod dil LA.  [name] COPD (chronic obstructive pulmonary disease) ([place])    a. On home O2.  [name] CVA (cerebral vascular accident) ([place])   . Diabetes mellitus   . GERD (gastroesophageal reflux disease)   . Gout   . Hypercholesterolemia   . Hypertension   . Ischemic cardiomyopathy    a.  06/2006  BIV-ICD -> gen change 2015 to [place].  . Lung cancer ([place])   . Nephrolithiasis 09/2000  . On home oxygen therapy    "2.5L; 24/7" (04/24/2016)  . Prostate cancer ([place]) 11/01/10   gleason 7, 8, 9, gold seeds 02/08/11  . Symptomatic bradycardia     Patient Active Problem List   Diagnosis Date Noted  . Hyperkalemia 04/24/2016  . Hypotension 04/24/2016  . Goals of care, counseling/discussion   . CHF exacerbation ([place]) 04/02/2016  . Acute on chronic respiratory failure ([place]) 04/02/2016  . COPD exacerbation ([place]) 04/02/2016  . Macrocytic anemia 04/02/2016  . Arterial thrombosis ([place]) 01/20/2016  . DM2 (diabetes mellitus, type 2) ([place]) 01/20/2016  . Critical lower limb ischemia 01/20/2016  . Ischemic leg 01/20/2016  . Chronic renal disease, stage III 01/19/2016  . Chronic anticoagulation-Xarelto 01/19/2016  . Cardiomyopathy, ischemic 01/19/2016  . Acute on chronic combined systolic and diastolic CHF (congestive heart failure) ([place]) 01/18/2016  . Anemia 01/16/2016  . Black lung disease ([place]) 10/27/2015  . Pain in the chest   . Hx of CABG x 3 2004 after multiple PCI's in'90s   . Chronic respiratory failure 08/08/2013  . CAP (community acquired pneumonia) 08/07/2013  . Pneumonia 08/07/2013  . Occlusion and stenosis of carotid artery without mention of cerebral infarction 05/26/2013  . Syncope 02/15/2013  . Weakness generalized 05/14/2011  . On home oxygen therapy   . Prostate  cancer ([place])   . Pacemaker   . Hematochezia 02/19/2011  . Melena 02/19/2011  . Prostate cancer, recur risk not determined whether low, med or high ([place])   . Systolic CHF, chronic ([place]) 02/16/2011  . SBO (small bowel obstruction) ([place]) 02/15/2011  . ARF (acute renal failure) ([place]) 02/15/2011  . UTI (lower urinary tract infection) 02/15/2011  . Syncope and collapse 02/10/2011  . S/P St Jude BiV ICD upgrade 2015 05/24/2009  . Cardiac conduction disorder 06/01/2008  . Chronic a-fib ([place])  06/01/2008    Past Surgical History:  Procedure Laterality Date  . BI-VENTRICULAR PACEMAKER INSERTION (CRT-P)  12-02-13   downgrade of previously implanted CRTD to STJ CRTP  . BIV PACEMAKER GENERATOR CHANGE OUT N/A 12/02/2013   Procedure: BIV PACEMAKER GENERATOR CHANGE OUT;  Surgeon: Evans Lance, MD;  Location[facility] C[facility] LAB;  Service: Cardiovascular;  Laterality: N/A;  . COLONOSCOPY    . CORONARY ANGIOPLASTY WITH STENT PLACEMENT  07/1997; 08/1997;03/1998  . CORONARY ARTERY BYPASS GRAFT  07/2002   CABG X3  . ESOPHAGOGASTRODUODENOSCOPY  02/19/2011   Procedure: ESOPHAGOGASTRODUODENOSCOPY (EGD);  Surgeon: Jeryl Columbia, MD;  Location: [facility] ENDOSCOPY;  Service: Endoscopy;  Laterality: N/A;  . FEMORAL-POPLITEAL BYPASS GRAFT Right 01/20/2016   Procedure: THROMBECTOMY OF POPLITEAL ARTERY AND ANTERIOR TIBIAL ARTERY RIGHT LEG; INTRAOPERATIVE ARTERIOGRAM;  Surgeon: Waynetta Sandy, MD;  Location: [place];  Service: Vascular;  Laterality: Right;  . INCISION AND DRAINAGE OF WOUND  08/2002   right thigh; S/P EVH  . INSERT / REPLACE / [place]; 1992; 01/2000;  . INSERT / REPLACE / REMOVE PACEMAKER  09/2003; 06/2006   w/AICD  . INSERT / REPLACE / REMOVE PACEMAKER  12/2004   pacmaker explant  . SHOULDER ARTHROSCOPY W/ ROTATOR CUFF REPAIR  05/2008   left  . upper endoscpopy         Home Medications    Prior to Admission medications   Medication Sig Start Date End Date Taking? Authorizing Provider  albuterol (PROVENTIL HFA;VENTOLIN HFA) 108 (90 Base) MCG/ACT inhaler Inhale 2 puffs into the lungs every 4 (four) hours as needed for wheezing or shortness of breath. 06/10/15  Yes Shawn C Joy, PA-C  albuterol (PROVENTIL) (2.5 MG/3ML) 0.083% nebulizer solution Take 2.5 mg by nebulization every 6 (six) hours as needed for wheezing or shortness of breath.   Yes Historical Provider, MD  allopurinol (ZYLOPRIM) 300 MG tablet Take 300 mg by mouth daily.     Yes Historical Provider, MD    CALCIUM-VITAMIN D PO Take 1 tablet by mouth 2 (two) times daily.    Yes Historical Provider, MD  ELIQUIS 2.5 MG TABS tablet Take 1 tablet by mouth 2 (two) times daily. 04/06/16  Yes Historical Provider, MD  furosemide (LASIX) 40 MG tablet Take 1 tablet (40 mg total) by mouth daily. 04/25/16  Yes Robbie Lis, MD  hydrocortisone cream 1 % Apply to affected area 2 times daily Patient taking differently: Apply 1 application topically 2 (two) times daily as needed for itching.  10/05/14  Yes Hollace Kinnier Sofia, PA-C  isosorbide mononitrate (IMDUR) 30 MG 24 hr tablet Take 30 mg by mouth daily. 04/04/16  Yes Historical Provider, MD  metoprolol succinate (TOPROL-XL) 25 MG 24 hr tablet Take 25 mg by mouth daily. 04/04/16  Yes Historical Provider, MD  nitroGLYCERIN (NITROSTAT) 0.4 MG SL tablet Place 0.4 mg under the tongue every 5 (five) minutes as needed for chest pain.   Yes Historical Provider, MD  Omega-3 Fatty Acids (FISH OIL) 1000 MG CAPS Take 1,000 mg by mouth at bedtime.    Yes Historical Provider, MD  omeprazole (PRILOSEC) 40 MG capsule Take 1 capsule (40 mg total) by mouth 2 (two) times daily. 01/20/16  Yes Orson Eva, MD  OXYGEN Inhale 2.5 L into the lungs continuous.   Yes Historical Provider, MD  potassium chloride SA (K-DUR,KLOR-CON) 10 MEQ tablet Take 1 tablet (10 mEq total) by mouth daily. 04/25/16  Yes Robbie Lis, MD  vitamin B-12 (CYANOCOBALAMIN) 1000 MCG tablet Take 1,000 mcg by mouth 2 (two) times daily.   Yes Historical Provider, MD    Family History Family History  Problem Relation Age of Onset  . Alzheimer's disease Father 50  . Cancer Father 36    metastatic prostate cancer  . Diabetes Sister 24  . Diabetes Brother   . Diabetes Brother   . Diabetes Brother   . Hypotension Neg Hx   . Malignant hyperthermia Neg Hx   . Pseudochol deficiency Neg Hx     Social History Social History  Substance Use Topics  . Smoking status: Former Smoker    Types: Cigarettes    Quit date:  03/12/1949  . Smokeless tobacco: Current User    Types: Chew  . Alcohol use No     Comment: drank until per pt 03/19/11, h/o heavy use     Allergies   Digitek [digoxin]; Aspirin; and Penicillins   Review of Systems Review of Systems 10 Systems reviewed and are negative for acute change except as noted in the HPI.  Physical Exam Updated Vital Signs BP (!) 142/84   Pulse 63   Temp 97.4 F (36.3 C) (Oral)   Resp (!) 26   Ht '5\' 3"'$  (1.6 m)   Wt 135 lb (61.2 kg)   SpO2 100%   BMI 23.91 kg/m   Physical Exam  Constitutional: He is oriented to person, place, and time. Vital signs are normal. He appears well-developed and well-nourished.  Non-toxic appearance. He does not appear ill. No distress.  Nasal canula in place  HENT:  Head: Normocephalic and atraumatic.  Nose: Nose normal.  Mouth/Throat: Oropharynx is clear and moist. No oropharyngeal exudate.  Eyes: Conjunctivae and EOM are normal. Pupils are equal, round, and reactive to light. No scleral icterus.  Neck: Normal range of motion. Neck supple. No tracheal deviation, no edema, no erythema and normal range of motion present. No thyroid mass and no thyromegaly present.  Cardiovascular: Normal rate, regular rhythm, S1 normal, S2 normal, normal heart sounds, intact distal pulses and normal pulses.  Exam reveals no gallop and no friction rub.   No murmur heard. Pulmonary/Chest: Effort normal and breath sounds normal. No respiratory distress. He has no wheezes. He has no rhonchi. He has no rales.  Abdominal: Soft. Normal appearance and bowel sounds are normal. He exhibits no distension, no ascites and no mass. There is no hepatosplenomegaly. There is no tenderness. There is no rebound, no guarding and no CVA tenderness.  Musculoskeletal: Normal range of motion. He exhibits no edema or tenderness.  Lymphadenopathy:    He has no cervical adenopathy.  Neurological: He is alert and oriented to person, place, and time. He has normal  strength. No cranial nerve deficit or sensory deficit.  Skin: Skin is warm, dry and intact. No petechiae and no rash noted. He is not diaphoretic. No erythema. No pallor.  Nursing note and vitals reviewed.    ED Treatments / Results  DIAGNOSTIC STUDIES:  Oxygen Saturation is 100% on [place], normal by my interpretation.    COORDINATION OF CARE:  12:33 AM Will order CT abdomen, fentanyl and Zofran. Discussed treatment plan with pt at bedside and pt agreed to plan.   Labs (all labs ordered are listed, but only abnormal results are displayed) Labs Reviewed  COMPREHENSIVE METABOLIC PANEL - Abnormal; Notable for the following:       Result Value   Chloride 97 (*)    Glucose, Bld 135 (*)    Creatinine, Ser 1.27 (*)    Alkaline Phosphatase 133 (*)    GFR calc non Af Amer 51 (*)    GFR calc Af Amer 59 (*)    Anion gap 18 (*)    All other components within normal limits  CBC - Abnormal; Notable for the following:    WBC 3.1 (*)    RBC 2.96 (*)    Hemoglobin 9.0 (*)    HCT 29.2 (*)    RDW 19.2 (*)    Platelets 108 (*)    All other components within normal limits  LIPASE, BLOOD  URINALYSIS, ROUTINE W REFLEX MICROSCOPIC    EKG  EKG Interpretation  Date/Time:  Friday July 06 2016 20:42:33 EDT Ventricular Rate:  63 PR Interval:    QRS Duration: 172 QT Interval:  478 QTC Calculation: 489 R Axis:   136 Text Interpretation:  Ventricular-paced rhythm Biventricular pacemaker detected Abnormal ECG no scarbossa No significant change since last tracing Confirmed by Glynn Octave [phone]) on 07/07/2016 12:12:30 AM       Radiology Dg Chest 2 View  Result Date: 07/06/2016 CLINICAL DATA:  83 year old male with right lower abdominal pain and nausea. Shortness of breath. EXAM: CHEST  2 VIEW COMPARISON:  Chest radiograph dated 04/02/2016 FINDINGS: There is stable moderate cardiomegaly. No vascular congestion or edema. Median sternotomy wires, CABG vascular clips, and left pectoral  AICD device noted. Retained pacemaker wire in the right chest wall noted. Small focal hazy density along the lateral aspect of the minor fissure likely corresponds to the scarring seen on the CT. There is chronic changes of the left lung base with areas of scarring and pleural thickening. No focal consolidation, pleural effusion, or pneumothorax. There is degenerative changes of the spine. No acute osseous pathology. IMPRESSION: 1. No acute cardiopulmonary process. 2. Stable moderate cardiomegaly.  No vascular congestion or edema. 3. Chronic changes and scarring predominantly at the left lung base. Electronically Signed   By: Anner Crete M.D.   On: 07/06/2016 21:56   Ct Abdomen Pelvis W Contrast  Result Date: 07/07/2016 CLINICAL DATA:  Right lower quadrant pain EXAM: CT ABDOMEN AND PELVIS WITH CONTRAST TECHNIQUE: Multidetector CT imaging of the abdomen and pelvis was performed using the standard protocol following bolus administration of intravenous contrast. CONTRAST:  112m ISOVUE-300 IOPAMIDOL (ISOVUE-300) INJECTION 61% COMPARISON:  01/17/2016 FINDINGS: Lower chest: Small right greater than left pleural effusions. Calcified pleural plaque on the left posteriorly. Stable cardiomegaly with right atrial, coronary sinus and right ventricular leads noted. Chronic rounded atelectasis at the left lung base posteriorly. Bibasilar dependent and subsegmental atelectasis. Hepatobiliary: Gallbladder is contracted. No gallstones are seen. No space-occupying mass of liver. No ductal dilatation. Pancreas: No pancreatic ductal dilatation or focal mass. Spleen: Normal Adrenals/Urinary Tract: Normal adrenal glands. Right lower pole nonobstructing 8.5 mm renal stone. Stable left lower pole 3 cm exophytic renal cyst. This exophytic cyst again demonstrates peripheral thin mural calcifications. Stomach/Bowel: The stomach is distended with contrast and fluid. There  is no bowel obstruction. There is mild diffuse distention of  small intestine approximately, nonspecific in etiology. Mild enteritis is not excluded. Circular muscle hypertrophy and sigmoid diverticulosis noted as before. Vascular/Lymphatic: Aortoiliac and branch vessel atherosclerosis. No aortic aneurysm or dissection. No lymphadenopathy. Reproductive: Radiation seeds noted of the normal size prostate. Other: No free air or free fluid. Musculoskeletal: Schmorl's node involving the superior endplate of L4. Degenerative disc disease L2-3 and L4-5. Multilevel osteophytes. IMPRESSION: Findings suggest mild enteritis. Sigmoid diverticulosis with circular muscle hypertrophy. Complex lower pole 3 cm exophytic cyst with thin peripheral calcifications suggesting a Bosniak category 2 renal cyst. Nonobstructing stable right lower pole 8.5 mm renal calculus. Small bilateral pleural effusions. Left-sided calcified pleural plaque consistent with prior asbestos exposure. Electronically Signed   By: Ashley Royalty M.D.   On: 07/07/2016 02:36    Procedures Procedures (including critical care time)  Medications Ordered in ED Medications  fentaNYL (SUBLIMAZE) injection 50 mcg (50 mcg Intravenous Given 07/07/16 0140)  ondansetron (ZOFRAN) injection 4 mg (4 mg Intravenous Given 07/07/16 0140)  iopamidol (ISOVUE-300) 61 % injection (100 mLs  Contrast Given 07/07/16 0150)     Initial Impression / Assessment and Plan / ED Course  I have reviewed the triage vital signs and the nursing notes.  Pertinent labs & imaging results that were available during my care of the patient were reviewed by me and considered in my medical decision making (see chart for details).     Patient presents to the ED for abdominal pain that started today.  He has had N/V as well.  It is in the BLQ and is burning.  Labs are normal. He was given fentanyl and zofran for symptomatic control.  Will obtain CT for further evaluation.  CT reveals enteritis.  Education provided. Will Dc home with zofran to take as  needed.  He appears well and in NAD.  VS remain within his normal limits and he is safe for DC.  Final Clinical Impressions(s) / ED Diagnoses   Final diagnoses:  None    New Prescriptions New Prescriptions   No medications on file       I personally performed the services described in this documentation, which was scribed in my presence. The recorded information has been reviewed and is accurate.      Everlene Balls, MD 07/07/16 [phone]

## 2016-07-07 NOTE — ED Notes (Signed)
Patient transported to CT 

## 2016-07-16 DIAGNOSIS — R609 Edema, unspecified: Secondary | ICD-10-CM | POA: Diagnosis not present

## 2016-07-17 ENCOUNTER — Ambulatory Visit (HOSPITAL_COMMUNITY)
Admission: RE | Admit: 2016-07-17 | Discharge: 2016-07-17 | Disposition: A | Discharge status: Home / Self Care | Source: Ambulatory Visit | Attending: Internal Medicine | Admitting: Internal Medicine

## 2016-07-17 ENCOUNTER — Other Ambulatory Visit (HOSPITAL_COMMUNITY): Payer: Self-pay | Admitting: Internal Medicine

## 2016-07-17 DIAGNOSIS — M7989 Other specified soft tissue disorders: Secondary | ICD-10-CM | POA: Insufficient documentation

## 2016-07-17 DIAGNOSIS — R609 Edema, unspecified: Secondary | ICD-10-CM | POA: Diagnosis not present

## 2016-07-17 NOTE — Progress Notes (Signed)
VASCULAR LAB PRELIMINARY  PRELIMINARY  PRELIMINARY  PRELIMINARY  Bilateral lower extremity venous duplex completed.    Preliminary report:  Bilateral:  No evidence of DVT, superficial thrombosis, or Baker's Cyst.   Carollee Nussbaumer, RVS 07/17/2016, 11:59 AM

## 2016-07-19 DIAGNOSIS — R609 Edema, unspecified: Secondary | ICD-10-CM | POA: Diagnosis not present

## 2016-07-24 ENCOUNTER — Emergency Department (HOSPITAL_COMMUNITY)

## 2016-07-24 ENCOUNTER — Encounter (HOSPITAL_COMMUNITY): Payer: Self-pay

## 2016-07-24 ENCOUNTER — Inpatient Hospital Stay (HOSPITAL_COMMUNITY)
Admission: EM | Admit: 2016-07-24 | Discharge: 2016-07-26 | DRG: 291 | Disposition: A | Discharge status: Home / Self Care | Attending: Internal Medicine | Admitting: Internal Medicine

## 2016-07-24 DIAGNOSIS — I509 Heart failure, unspecified: Secondary | ICD-10-CM

## 2016-07-24 DIAGNOSIS — Z955 Presence of coronary angioplasty implant and graft: Secondary | ICD-10-CM | POA: Diagnosis not present

## 2016-07-24 DIAGNOSIS — Z66 Do not resuscitate: Secondary | ICD-10-CM | POA: Diagnosis not present

## 2016-07-24 DIAGNOSIS — Z87891 Personal history of nicotine dependence: Secondary | ICD-10-CM

## 2016-07-24 DIAGNOSIS — I482 Chronic atrial fibrillation, unspecified: Secondary | ICD-10-CM | POA: Diagnosis present

## 2016-07-24 DIAGNOSIS — J449 Chronic obstructive pulmonary disease, unspecified: Secondary | ICD-10-CM | POA: Diagnosis present

## 2016-07-24 DIAGNOSIS — N183 Chronic kidney disease, stage 3 unspecified: Secondary | ICD-10-CM | POA: Diagnosis present

## 2016-07-24 DIAGNOSIS — Z886 Allergy status to analgesic agent status: Secondary | ICD-10-CM | POA: Diagnosis not present

## 2016-07-24 DIAGNOSIS — I5023 Acute on chronic systolic (congestive) heart failure: Secondary | ICD-10-CM | POA: Diagnosis present

## 2016-07-24 DIAGNOSIS — I2510 Atherosclerotic heart disease of native coronary artery without angina pectoris: Secondary | ICD-10-CM | POA: Diagnosis present

## 2016-07-24 DIAGNOSIS — J9620 Acute and chronic respiratory failure, unspecified whether with hypoxia or hypercapnia: Secondary | ICD-10-CM | POA: Diagnosis present

## 2016-07-24 DIAGNOSIS — Z888 Allergy status to other drugs, medicaments and biological substances status: Secondary | ICD-10-CM | POA: Diagnosis not present

## 2016-07-24 DIAGNOSIS — I5043 Acute on chronic combined systolic (congestive) and diastolic (congestive) heart failure: Secondary | ICD-10-CM | POA: Diagnosis present

## 2016-07-24 DIAGNOSIS — E1122 Type 2 diabetes mellitus with diabetic chronic kidney disease: Secondary | ICD-10-CM | POA: Diagnosis present

## 2016-07-24 DIAGNOSIS — I255 Ischemic cardiomyopathy: Secondary | ICD-10-CM | POA: Diagnosis not present

## 2016-07-24 DIAGNOSIS — Z880 Allergy status to penicillin: Secondary | ICD-10-CM | POA: Diagnosis not present

## 2016-07-24 DIAGNOSIS — Z951 Presence of aortocoronary bypass graft: Secondary | ICD-10-CM

## 2016-07-24 DIAGNOSIS — Z95810 Presence of automatic (implantable) cardiac defibrillator: Secondary | ICD-10-CM

## 2016-07-24 DIAGNOSIS — Z8673 Personal history of transient ischemic attack (TIA), and cerebral infarction without residual deficits: Secondary | ICD-10-CM

## 2016-07-24 DIAGNOSIS — Z7901 Long term (current) use of anticoagulants: Secondary | ICD-10-CM | POA: Diagnosis not present

## 2016-07-24 DIAGNOSIS — J9621 Acute and chronic respiratory failure with hypoxia: Secondary | ICD-10-CM | POA: Diagnosis not present

## 2016-07-24 DIAGNOSIS — Z9981 Dependence on supplemental oxygen: Secondary | ICD-10-CM | POA: Diagnosis not present

## 2016-07-24 DIAGNOSIS — Z7951 Long term (current) use of inhaled steroids: Secondary | ICD-10-CM | POA: Diagnosis not present

## 2016-07-24 DIAGNOSIS — J60 Coalworker's pneumoconiosis: Secondary | ICD-10-CM | POA: Diagnosis present

## 2016-07-24 DIAGNOSIS — I130 Hypertensive heart and chronic kidney disease with heart failure and stage 1 through stage 4 chronic kidney disease, or unspecified chronic kidney disease: Principal | ICD-10-CM | POA: Diagnosis present

## 2016-07-24 DIAGNOSIS — Z85118 Personal history of other malignant neoplasm of bronchus and lung: Secondary | ICD-10-CM

## 2016-07-24 DIAGNOSIS — Z8546 Personal history of malignant neoplasm of prostate: Secondary | ICD-10-CM

## 2016-07-24 LAB — I-STAT TROPONIN, ED: TROPONIN I, POC: 0.03 ng/mL (ref 0.00–0.08)

## 2016-07-24 LAB — CBC WITH DIFFERENTIAL/PLATELET
BASOS ABS: 0 10*3/uL (ref 0.0–0.1)
Basophils Relative: 0 %
Eosinophils Absolute: 0 10*3/uL (ref 0.0–0.7)
Eosinophils Relative: 0 %
HEMATOCRIT: 28.4 % — AB (ref 39.0–52.0)
Hemoglobin: 8.8 g/dL — ABNORMAL LOW (ref 13.0–17.0)
LYMPHS ABS: 1.2 10*3/uL (ref 0.7–4.0)
Lymphocytes Relative: 39 %
MCH: 30.4 pg (ref 26.0–34.0)
MCHC: 31 g/dL (ref 30.0–36.0)
MCV: 98.3 fL (ref 78.0–100.0)
MONO ABS: 0.4 10*3/uL (ref 0.1–1.0)
Monocytes Relative: 12 %
NEUTROS ABS: 1.5 10*3/uL — AB (ref 1.7–7.7)
Neutrophils Relative %: 49 %
Platelets: 103 10*3/uL — ABNORMAL LOW (ref 150–400)
RBC: 2.89 MIL/uL — AB (ref 4.22–5.81)
RDW: 19.5 % — ABNORMAL HIGH (ref 11.5–15.5)
WBC: 3.2 10*3/uL — ABNORMAL LOW (ref 4.0–10.5)

## 2016-07-24 LAB — COMPREHENSIVE METABOLIC PANEL
ALT: 151 U/L — AB (ref 17–63)
AST: 32 U/L (ref 15–41)
Albumin: 3.3 g/dL — ABNORMAL LOW (ref 3.5–5.0)
Alkaline Phosphatase: 149 U/L — ABNORMAL HIGH (ref 38–126)
Anion gap: 11 (ref 5–15)
BILIRUBIN TOTAL: 1.4 mg/dL — AB (ref 0.3–1.2)
BUN: 25 mg/dL — AB (ref 6–20)
CALCIUM: 9.1 mg/dL (ref 8.9–10.3)
CO2: 22 mmol/L (ref 22–32)
CREATININE: 1.53 mg/dL — AB (ref 0.61–1.24)
Chloride: 101 mmol/L (ref 101–111)
GFR, EST AFRICAN AMERICAN: 47 mL/min — AB (ref >60)
GFR, EST NON AFRICAN AMERICAN: 41 mL/min — AB (ref >60)
Glucose, Bld: 93 mg/dL (ref 65–99)
Potassium: 4.7 mmol/L (ref 3.5–5.1)
Sodium: 134 mmol/L — ABNORMAL LOW (ref 135–145)
TOTAL PROTEIN: 7.5 g/dL (ref 6.5–8.1)

## 2016-07-24 LAB — URINALYSIS, ROUTINE W REFLEX MICROSCOPIC
BACTERIA UA: NONE SEEN
BILIRUBIN URINE: NEGATIVE
Glucose, UA: NEGATIVE mg/dL
Ketones, ur: NEGATIVE mg/dL
Leukocytes, UA: NEGATIVE
Nitrite: NEGATIVE
PH: 5 (ref 5.0–8.0)
Protein, ur: NEGATIVE mg/dL
SPECIFIC GRAVITY, URINE: 1.01 (ref 1.005–1.030)
Squamous Epithelial / LPF: NONE SEEN

## 2016-07-24 LAB — BRAIN NATRIURETIC PEPTIDE: B Natriuretic Peptide: 3696.9 pg/mL — ABNORMAL HIGH (ref 0.0–100.0)

## 2016-07-24 MED ORDER — ONDANSETRON HCL 4 MG/2ML IJ SOLN: 4.0000 mg | Freq: Four times a day (QID) | INTRAMUSCULAR | Status: DC | PRN

## 2016-07-24 MED ORDER — FUROSEMIDE 10 MG/ML IJ SOLN
60.0000 mg | Freq: Once | INTRAMUSCULAR | Status: AC
  Administered 2016-07-24: 60 mg via INTRAVENOUS
  Filled 2016-07-24: qty 6

## 2016-07-24 MED ORDER — ALBUTEROL SULFATE (2.5 MG/3ML) 0.083% IN NEBU: 2.5000 mg | INHALATION_SOLUTION | Freq: Four times a day (QID) | RESPIRATORY_TRACT | Status: DC | PRN

## 2016-07-24 MED ORDER — PANTOPRAZOLE SODIUM 40 MG PO TBEC
80.0000 mg | DELAYED_RELEASE_TABLET | Freq: Every day | ORAL | Status: DC
  Administered 2016-07-25 – 2016-07-26 (×2): 80 mg via ORAL
  Filled 2016-07-24 (×2): qty 2

## 2016-07-24 MED ORDER — ISOSORBIDE MONONITRATE ER 30 MG PO TB24
30.0000 mg | ORAL_TABLET | Freq: Every day | ORAL | Status: DC
  Administered 2016-07-25 – 2016-07-26 (×2): 30 mg via ORAL
  Filled 2016-07-24 (×2): qty 1

## 2016-07-24 MED ORDER — APIXABAN 2.5 MG PO TABS
2.5000 mg | ORAL_TABLET | Freq: Two times a day (BID) | ORAL | Status: DC
  Administered 2016-07-25 – 2016-07-26 (×4): 2.5 mg via ORAL
  Filled 2016-07-24 (×4): qty 1

## 2016-07-24 MED ORDER — METOPROLOL SUCCINATE ER 25 MG PO TB24
25.0000 mg | ORAL_TABLET | Freq: Every day | ORAL | Status: DC
  Administered 2016-07-25 – 2016-07-26 (×2): 25 mg via ORAL
  Filled 2016-07-24 (×2): qty 1

## 2016-07-24 MED ORDER — ACETAMINOPHEN 325 MG PO TABS
650.0000 mg | ORAL_TABLET | ORAL | Status: DC | PRN
  Administered 2016-07-25: 650 mg via ORAL
  Filled 2016-07-24: qty 2

## 2016-07-24 MED ORDER — ALLOPURINOL 300 MG PO TABS
300.0000 mg | ORAL_TABLET | Freq: Every day | ORAL | Status: DC
  Administered 2016-07-25 – 2016-07-26 (×2): 300 mg via ORAL
  Filled 2016-07-24 (×2): qty 1

## 2016-07-24 MED ORDER — ALBUTEROL SULFATE (2.5 MG/3ML) 0.083% IN NEBU: 2.5000 mg | INHALATION_SOLUTION | RESPIRATORY_TRACT | Status: DC | PRN

## 2016-07-24 MED ORDER — SODIUM CHLORIDE 0.9% FLUSH
3.0000 mL | Freq: Two times a day (BID) | INTRAVENOUS | Status: DC
  Administered 2016-07-25 (×3): 3 mL via INTRAVENOUS

## 2016-07-24 MED ORDER — FUROSEMIDE 10 MG/ML IJ SOLN
60.0000 mg | Freq: Two times a day (BID) | INTRAMUSCULAR | Status: DC
  Administered 2016-07-25 (×2): 60 mg via INTRAVENOUS
  Filled 2016-07-24 (×2): qty 6

## 2016-07-24 MED ORDER — SODIUM CHLORIDE 0.9% FLUSH: 3.0000 mL | INTRAVENOUS | Status: DC | PRN

## 2016-07-24 MED ORDER — SODIUM CHLORIDE 0.9 % IV SOLN: 250.0000 mL | INTRAVENOUS | Status: DC | PRN

## 2016-07-24 MED ORDER — ONDANSETRON 4 MG PO TBDP
4.0000 mg | ORAL_TABLET | Freq: Three times a day (TID) | ORAL | Status: DC | PRN
  Filled 2016-07-24: qty 1

## 2016-07-24 MED ORDER — VITAMIN B-12 1000 MCG PO TABS
1000.0000 ug | ORAL_TABLET | Freq: Two times a day (BID) | ORAL | Status: DC
  Administered 2016-07-25 – 2016-07-26 (×3): 1000 ug via ORAL
  Filled 2016-07-24 (×3): qty 1

## 2016-07-24 NOTE — ED Triage Notes (Signed)
Per Pt and family, Pt is coming from home with complaints of SOB that started yesterday. Denies cough and report left leg swelling x 1 week. Pt was seen at [facility] and told he did not have DVT.

## 2016-07-24 NOTE — ED Provider Notes (Signed)
[facility] DEPT Provider Note   CSN: [id number] Arrival date & time: 07/24/16  1622     History   Chief Complaint Chief Complaint  Patient presents with  . Emesis  . Shortness of Breath    HPI [name] is a 83 y.o. male.  HPI [name] is a 83 y.o. male with history of coronary disease, COPD, CHF, chronic A. fib, chronic respiratory failure on home oxygen, CVA, presents to emergency department complaining of swelling and bilateral chest and back pain. Patient states that he has been swelling over the last several weeks. He went to emergency department a few weeks ago and was seen by primary care doctor and was told to increase Lasix from 40 mg daily to 80. He took double dose of Lasix twice last week and began yesterday and today. Last week the patient seemed to have good improvement in swelling after taking Lasix, however this week he continues to have swelling and discomfort around his chest. Patient reports chronic cough, reports associated shortness of breath. Patient is a hospice patient.  Past Medical History:  Diagnosis Date  . Arthritis   . Black lung disease ([place])   . CAD (coronary artery disease)    a. 07/2002 CABG x 3: LIMA->LAD, VG->Diag, VG->OM;  b. 06/2006 Cath: LM 50-60ost/p, LAD patent mid stent, D1 sev dzs, D2 patent stent, LCX nl, OM2 sev sten prox, RCA large/nl, VG->Diag nl, VG->OM nl, LIMA->LAD atretic, EF 30%.  . Carotid artery occlusion   . Chronic atrial fibrillation ([place])   . Chronic respiratory failure ([place])   . Chronic systolic CHF (congestive heart failure) ([place])    a. 12/2012 Echo: EF 20-25%, mid-dist antsept AK, mod dil LA.  [name] COPD (chronic obstructive pulmonary disease) ([place])    a. On home O2.  [name] CVA (cerebral vascular accident) ([place])   . Diabetes mellitus   . GERD (gastroesophageal reflux disease)   . Gout   . Hypercholesterolemia   . Hypertension   . Ischemic cardiomyopathy    a. 06/2006  BIV-ICD -> gen change 2015 to [place].  . Lung cancer ([place])   . Nephrolithiasis 09/2000  . On home oxygen therapy    "2.5L; 24/7" (04/24/2016)  . Prostate cancer ([place]) 11/01/10   gleason 7, 8, 9, gold seeds 02/08/11  . Symptomatic bradycardia     Patient Active Problem List   Diagnosis Date Noted  . Hyperkalemia 04/24/2016  . Hypotension 04/24/2016  . Goals of care, counseling/discussion   . CHF exacerbation ([place]) 04/02/2016  . Acute on chronic respiratory failure ([place]) 04/02/2016  . COPD exacerbation ([place]) 04/02/2016  . Macrocytic anemia 04/02/2016  . Arterial thrombosis ([place]) 01/20/2016  . DM2 (diabetes mellitus, type 2) ([place]) 01/20/2016  . Critical lower limb ischemia 01/20/2016  . Ischemic leg 01/20/2016  . Chronic renal disease, stage III 01/19/2016  . Chronic anticoagulation-Xarelto 01/19/2016  . Cardiomyopathy, ischemic 01/19/2016  . Acute on chronic combined systolic and diastolic CHF (congestive heart failure) ([place]) 01/18/2016  . Anemia 01/16/2016  . Black lung disease ([place]) 10/27/2015  . Pain in the chest   . Hx of CABG x 3 2004 after multiple PCI's in'90s   . Chronic respiratory failure 08/08/2013  . CAP (community acquired pneumonia) 08/07/2013  . Pneumonia 08/07/2013  . Occlusion and stenosis of carotid artery without mention of cerebral infarction 05/26/2013  . Syncope 02/15/2013  . Weakness generalized 05/14/2011  . On home oxygen therapy   . Prostate cancer ([place])   .  Pacemaker   . Hematochezia 02/19/2011  . Melena 02/19/2011  . Prostate cancer, recur risk not determined whether low, med or high ([place])   . Systolic CHF, chronic ([place]) 02/16/2011  . SBO (small bowel obstruction) ([place]) 02/15/2011  . ARF (acute renal failure) ([place]) 02/15/2011  . UTI (lower urinary tract infection) 02/15/2011  . Syncope and collapse 02/10/2011  . S/P [employer] Jude BiV ICD upgrade 2015 05/24/2009  . Cardiac conduction disorder 06/01/2008  . Chronic a-fib ([place]) 06/01/2008    Past Surgical History:  Procedure  Laterality Date  . BI-VENTRICULAR PACEMAKER INSERTION (CRT-P)  12-02-13   downgrade of previously implanted CRTD to STJ CRTP  . BIV PACEMAKER GENERATOR CHANGE OUT N/A 12/02/2013   Procedure: BIV PACEMAKER GENERATOR CHANGE OUT;  Surgeon: Evans Lance, MD;  Location: [facility] CATH LAB;  Service: Cardiovascular;  Laterality: N/A;  . COLONOSCOPY    . CORONARY ANGIOPLASTY WITH STENT PLACEMENT  07/1997; 08/1997;03/1998  . CORONARY ARTERY BYPASS GRAFT  07/2002   CABG X3  . ESOPHAGOGASTRODUODENOSCOPY  02/19/2011   Procedure: ESOPHAGOGASTRODUODENOSCOPY (EGD);  Surgeon: Jeryl Columbia, MD;  Location[facility] ENDOSCOPY;  Service: Endoscopy;  Laterality: N/A;  . FEMORAL-POPLITEAL BYPASS GRAFT Right 01/20/2016   Procedure: THROMBECTOMY OF POPLITEAL ARTERY AND ANTERIOR TIBIAL ARTERY RIGHT LEG; INTRAOPERATIVE ARTERIOGRAM;  Surgeon: Waynetta Sandy, MD;  Location: [facility];  Service: Vascular;  Laterality: Right;  . INCISION AND DRAINAGE OF WOUND  08/2002   right thigh; S/P EVH  . INSERT / REPLACE / [place]; 1992; 01/2000;  . INSERT / REPLACE / REMOVE PACEMAKER  09/2003; 06/2006   w/AICD  . INSERT / REPLACE / REMOVE PACEMAKER  12/2004   pacmaker explant  . SHOULDER ARTHROSCOPY W/ ROTATOR CUFF REPAIR  05/2008   left  . upper endoscpopy         Home Medications    Prior to Admission medications   Medication Sig Start Date End Date Taking? Authorizing Provider  albuterol (PROVENTIL HFA;VENTOLIN HFA) 108 (90 Base) MCG/ACT inhaler Inhale 2 puffs into the lungs every 4 (four) hours as needed for wheezing or shortness of breath. 06/10/15   Joy, Shawn C, PA-C  albuterol (PROVENTIL) (2.5 MG/3ML) 0.083% nebulizer solution Take 2.5 mg by nebulization every 6 (six) hours as needed for wheezing or shortness of breath.    [provider]  allopurinol (ZYLOPRIM) 300 MG tablet Take 300 mg by mouth daily.      [provider]  CALCIUM-VITAMIN D PO Take 1 tablet by mouth 2 (two) times daily.      [provider]  ELIQUIS 2.5 MG TABS tablet Take 1 tablet by mouth 2 (two) times daily. 04/06/16   [provider]  furosemide (LASIX) 40 MG tablet Take 1 tablet (40 mg total) by mouth daily. 04/25/16   Robbie Lis, MD  hydrocortisone cream 1 % Apply to affected area 2 times daily Patient taking differently: Apply 1 application topically 2 (two) times daily as needed for itching.  10/05/14   Fransico Meadow, PA-C  isosorbide mononitrate (IMDUR) 30 MG 24 hr tablet Take 30 mg by mouth daily. 04/04/16   [provider]  metoprolol succinate (TOPROL-XL) 25 MG 24 hr tablet Take 25 mg by mouth daily. 04/04/16   [provider]  nitroGLYCERIN (NITROSTAT) 0.4 MG SL tablet Place 0.4 mg under the tongue every 5 (five) minutes as needed for chest pain.    [provider]  Omega-3 Fatty Acids (FISH OIL) 1000  MG CAPS Take 1,000 mg by mouth at bedtime.     [provider]  omeprazole (PRILOSEC) 40 MG capsule Take 1 capsule (40 mg total) by mouth 2 (two) times daily. 01/20/16   Orson Eva, MD  ondansetron (ZOFRAN ODT) 4 MG disintegrating tablet Take 1 tablet (4 mg total) by mouth every 8 (eight) hours as needed for nausea or vomiting. 07/07/16   Everlene Balls, MD  OXYGEN Inhale 2.5 L into the lungs continuous.    [provider]  potassium chloride SA (K-DUR,KLOR-CON) 10 MEQ tablet Take 1 tablet (10 mEq total) by mouth daily. 04/25/16   Robbie Lis, MD  vitamin B-12 (CYANOCOBALAMIN) 1000 MCG tablet Take 1,000 mcg by mouth 2 (two) times daily.    [provider]    Family History Family History  Problem Relation Age of Onset  . Alzheimer's disease Father 45  . Cancer Father 41       metastatic prostate cancer  . Diabetes Sister 22  . Diabetes Brother   . Diabetes Brother   . Diabetes Brother   . Hypotension Neg Hx   . Malignant hyperthermia Neg Hx   . Pseudochol deficiency Neg Hx     Social History Social History  Substance Use  Topics  . Smoking status: Former Smoker    Types: Cigarettes    Quit date: 03/12/1949  . Smokeless tobacco: Current User    Types: Chew  . Alcohol use No     Comment: drank until per pt 03/19/11, h/o heavy use     Allergies   Digitek [digoxin]; Aspirin; and Penicillins   Review of Systems Review of Systems  Constitutional: Negative for chills and fever.  Respiratory: Positive for cough, chest tightness and shortness of breath.   Cardiovascular: Positive for leg swelling. Negative for chest pain and palpitations.  Gastrointestinal: Negative for abdominal distention, abdominal pain, diarrhea, nausea and vomiting.  Genitourinary: Negative for dysuria, frequency, hematuria and urgency.  Musculoskeletal: Positive for back pain. Negative for arthralgias, myalgias, neck pain and neck stiffness.  Skin: Negative for rash.  Allergic/Immunologic: Negative for immunocompromised state.  Neurological: Negative for dizziness, weakness, light-headedness, numbness and headaches.  All other systems reviewed and are negative.    Physical Exam Updated Vital Signs BP 125/68 (BP Location: Right Arm)   Pulse 63   Temp 97.4 F (36.3 C) (Oral)   Resp 18   Ht '5\' 3"'$  (1.6 m)   Wt 61.2 kg   SpO2 97%   BMI 23.91 kg/m   Physical Exam  Constitutional: He appears well-developed and well-nourished. No distress.  HENT:  Head: Normocephalic and atraumatic.  Eyes: Conjunctivae are normal.  Neck: Neck supple.  Cardiovascular: Normal rate, regular rhythm and normal heart sounds.   Pulmonary/Chest: Effort normal. No respiratory distress. He has no wheezes. He has rales.  Rales at bases bilaterally  Abdominal: Soft. Bowel sounds are normal. He exhibits no distension. There is no tenderness. There is no rebound.  Musculoskeletal: He exhibits edema.  2+ edema of lower extremities up to the knee level bilaterally.  Neurological: He is alert.  Skin: Skin is warm and dry.  Nursing note and vitals  reviewed.    ED Treatments / Results  Labs (all labs ordered are listed, but only abnormal results are displayed) Labs Reviewed  CBC WITH DIFFERENTIAL/PLATELET - Abnormal; Notable for the following:       Result Value   WBC 3.2 (*)    RBC 2.89 (*)    Hemoglobin  8.8 (*)    HCT 28.4 (*)    RDW 19.5 (*)    Platelets 103 (*)    Neutro Abs 1.5 (*)    All other components within normal limits  COMPREHENSIVE METABOLIC PANEL - Abnormal; Notable for the following:    Sodium 134 (*)    BUN 25 (*)    Creatinine, Ser 1.53 (*)    Albumin 3.3 (*)    ALT 151 (*)    Alkaline Phosphatase 149 (*)    Total Bilirubin 1.4 (*)    GFR calc non Af Amer 41 (*)    GFR calc Af Amer 47 (*)    All other components within normal limits  BRAIN NATRIURETIC PEPTIDE - Abnormal; Notable for the following:    B Natriuretic Peptide 3,696.9 (*)    All other components within normal limits  URINALYSIS, ROUTINE W REFLEX MICROSCOPIC - Abnormal; Notable for the following:    Hgb urine dipstick LARGE (*)    All other components within normal limits  I-STAT TROPOININ, ED    EKG  EKG Interpretation  Date/Time:  Tuesday Jul 24 2016 16:29:01 EDT Ventricular Rate:  61 PR Interval:    QRS Duration: 174 QT Interval:  502 QTC Calculation: 505 R Axis:   140 Text Interpretation:  Ventricular-paced rhythm with intrinsic complexes Abnormal ECG No significant change since last tracing Confirmed by [facility] MD, PEDRO ([id number]) on 07/24/2016 10:38:40 PM       Radiology Dg Chest 2 View  Result Date: 07/24/2016 CLINICAL DATA:  Shortness of breath. EXAM: CHEST  2 VIEW COMPARISON:  July 06, 2016 FINDINGS: No pneumothorax. Cardiomegaly. Bilateral pulmonary opacities could represent developing infiltrate or edema. AICD device is identified. No other acute abnormalities are seen. IMPRESSION: Cardiomegaly. Bilateral pulmonary opacities. The pulmonary opacities are not specific based on imaging. History of shortness of breath  with leg swelling and no cough suggests edema. Developing infiltrate not excluded on this study. Recommend clinical correlation and follow-up to resolution. Electronically Signed   By: Dorise Bullion III M.D   On: 07/24/2016 17:33    Procedures Procedures (including critical care time)  Medications Ordered in ED Medications - No data to display   Initial Impression / Assessment and Plan / ED Course  I have reviewed the triage vital signs and the nursing notes.  Pertinent labs & imaging results that were available during my care of the patient were reviewed by me and considered in my medical decision making (see chart for details).     Patient emergency department with lower extremity swelling and pain around her lower chest and back. Will check chest x-ray and labs. Patient is in no acute distress otherwise, no respiratory distress at rest at this time. Will continue to monitor.   Hemoglobin 8.8, close to baseline. Creatinine 1.53. BNP is 3696, with possible pulmonary edema on the chest x-ray. Patient has gained approximately 15 pounds of weight in the last several months, since his last admission. Believe patient may benefit from admission for diuresis. Will order 60 mg of Lasix IV in the department.   Spoke with hospitalist, they will come by and see patient.  Vitals:   07/24/16 2115 07/24/16 2136 07/24/16 2154 07/24/16 2230  BP: 127/80 125/85  129/86  Pulse: (!) 58 61  65  Resp: (!) 28 (!) 21  (!) 21  Temp:      TempSrc:      SpO2: 95% 100%  100%  Weight:   59.1 kg  Height:         Final Clinical Impressions(s) / ED Diagnoses   Final diagnoses:  Congestive heart failure, unspecified HF chronicity, unspecified heart failure type [facility])    New Prescriptions New Prescriptions   No medications on file     Jeannett Senior, Hershal Coria 07/24/16 2339    Fatima Blank, MD 07/25/16 [phone]

## 2016-07-24 NOTE — H&P (Signed)
History and Physical    [name] JKD:[id number] DOB: 28-Jul-1932 DOA: 07/24/2016  PCP: [name], MD  Patient coming from: Home  I have personally briefly reviewed patient's old medical records in [place]  Chief Complaint: SOB  HPI: [name] is a 83 y.o. male with medical history significant of ICM with EF 15%, severe lung disease due to COPD, black lung disease, on 2.5L home O2.  Patient presents to the ED with c/o SOB.  Associated BLE edema.  Symptoms have been progressively worsening since onset a couple of weeks ago.  Patients lasix was decreased to '40mg'$  daily in Feb after admission for AKF and hypotension.  Patient was told by PCP to increase lasix to 80 at home (not clear on wether 40 BID or 80 daily at home).  He did this for 2 days last week and then again yesterday and today.  Last week this seemed to help.  The double dosage for last 2 days hasnt helped.   ED Course: CXR shows CHF, BNP is 3.6k.   Review of Systems: As per HPI otherwise 10 point review of systems negative.   Past Medical History:  Diagnosis Date  . Arthritis   . Black lung disease ([place])   . CAD (coronary artery disease)    a. 07/2002 CABG x 3: LIMA->LAD, VG->Diag, VG->OM;  b. 06/2006 Cath: LM 50-60ost/p, LAD patent mid stent, D1 sev dzs, D2 patent stent, LCX nl, OM2 sev sten prox, RCA large/nl, VG->Diag nl, VG->OM nl, LIMA->LAD atretic, EF 30%.  . Carotid artery occlusion   . Chronic atrial fibrillation ([place])   . Chronic respiratory failure ([place])   . Chronic systolic CHF (congestive heart failure) ([place])    a. 12/2012 Echo: EF 20-25%, mid-dist antsept AK, mod dil LA.  [name] COPD (chronic obstructive pulmonary disease) ([place])    a. On home O2.  [name] CVA (cerebral vascular accident) ([place])   . Diabetes mellitus   . GERD (gastroesophageal reflux disease)   . Gout   . Hypercholesterolemia   . Hypertension   . Ischemic cardiomyopathy    a. 06/2006  BIV-ICD -> gen change 2015 to [place].    . Lung cancer ([place])   . Nephrolithiasis 09/2000  . On home oxygen therapy    "2.5L; 24/7" (04/24/2016)  . Prostate cancer ([place]) 11/01/10   gleason 7, 8, 9, gold seeds 02/08/11  . Symptomatic bradycardia     Past Surgical History:  Procedure Laterality Date  . BI-VENTRICULAR PACEMAKER INSERTION (CRT-P)  12-02-13   downgrade of previously implanted CRTD to STJ CRTP  . BIV PACEMAKER GENERATOR CHANGE OUT N/A 12/02/2013   Procedure: BIV PACEMAKER GENERATOR CHANGE OUT;  Surgeon: Evans Lance, MD;  Location: [facility] CATH LAB;  Service: Cardiovascular;  Laterality: N/A;  . COLONOSCOPY    . CORONARY ANGIOPLASTY WITH STENT PLACEMENT  07/1997; 08/1997;03/1998  . CORONARY ARTERY BYPASS GRAFT  07/2002   CABG X3  . ESOPHAGOGASTRODUODENOSCOPY  02/19/2011   Procedure: ESOPHAGOGASTRODUODENOSCOPY (EGD);  Surgeon: Jeryl Columbia, MD;  Location[facility] ENDOSCOPY;  Service: Endoscopy;  Laterality: N/A;  . FEMORAL-POPLITEAL BYPASS GRAFT Right 01/20/2016   Procedure: THROMBECTOMY OF POPLITEAL ARTERY AND ANTERIOR TIBIAL ARTERY RIGHT LEG; INTRAOPERATIVE ARTERIOGRAM;  Surgeon: Waynetta Sandy, MD;  Location: [place];  Service: Vascular;  Laterality: Right;  . INCISION AND DRAINAGE OF WOUND  08/2002   right thigh; S/P EVH  . INSERT / REPLACE / [place]; 1992; 01/2000;  . INSERT /  REPLACE / REMOVE PACEMAKER  09/2003; 06/2006   w/AICD  . INSERT / REPLACE / REMOVE PACEMAKER  12/2004   pacmaker explant  . SHOULDER ARTHROSCOPY W/ ROTATOR CUFF REPAIR  05/2008   left  . upper endoscpopy       reports that he quit smoking about 67 years ago. His smoking use included Cigarettes. His smokeless tobacco use includes Chew. He reports that he does not drink alcohol or use drugs.  Allergies  Allergen Reactions  . [place] [Digoxin] Other (See Comments)    Caused patient to have chest pains and dizzy spells  . Aspirin Hives  . Penicillins Rash    Rash Has patient had a PCN reaction causing immediate rash,  facial/tongue/throat swelling, SOB or lightheadedness with hypotension:YES Has patient had a PCN reaction causing severe rash involving mucus membranes or skin necrosis: NO Has patient had a PCN reaction that required hospitalization NO Has patient had a PCN reaction occurring within the last 10 years:NO If all of the above answers are "NO", then may proceed with Cephalosporin use.    Family History  Problem Relation Age of Onset  . Alzheimer's disease Father 90  . Cancer Father 24       metastatic prostate cancer  . Diabetes Sister 62  . Diabetes Brother   . Diabetes Brother   . Diabetes Brother   . Hypotension Neg Hx   . Malignant hyperthermia Neg Hx   . Pseudochol deficiency Neg Hx     Prior to Admission medications   Medication Sig Start Date End Date Taking? Authorizing Provider  albuterol (PROVENTIL HFA;VENTOLIN HFA) 108 (90 Base) MCG/ACT inhaler Inhale 2 puffs into the lungs every 4 (four) hours as needed for wheezing or shortness of breath. 06/10/15  Yes Joy, Shawn C, PA-C  albuterol (PROVENTIL) (2.5 MG/3ML) 0.083% nebulizer solution Take 2.5 mg by nebulization every 6 (six) hours as needed for wheezing or shortness of breath.   Yes [provider]  allopurinol (ZYLOPRIM) 300 MG tablet Take 300 mg by mouth daily.     Yes [provider]  CALCIUM-VITAMIN D PO Take 1 tablet by mouth 2 (two) times daily.    Yes [provider]  ELIQUIS 2.5 MG TABS tablet Take 1 tablet by mouth 2 (two) times daily. 04/06/16  Yes [provider]  furosemide (LASIX) 40 MG tablet Take 1 tablet (40 mg total) by mouth daily. 04/25/16  Yes Robbie Lis, MD  hydrocortisone cream 1 % Apply to affected area 2 times daily Patient taking differently: Apply 1 application topically 2 (two) times daily as needed for itching.  10/05/14  Yes Caryl Ada K, PA-C  isosorbide mononitrate (IMDUR) 30 MG 24 hr tablet Take 30 mg by mouth daily. 04/04/16  Yes [provider]    metoprolol succinate (TOPROL-XL) 25 MG 24 hr tablet Take 25 mg by mouth daily. 04/04/16  Yes [provider]  nitroGLYCERIN (NITROSTAT) 0.4 MG SL tablet Place 0.4 mg under the tongue every 5 (five) minutes as needed for chest pain.   Yes [provider]  Omega-3 Fatty Acids (FISH OIL) 1000 MG CAPS Take 1,000 mg by mouth at bedtime.    Yes [provider]  omeprazole (PRILOSEC) 40 MG capsule Take 1 capsule (40 mg total) by mouth 2 (two) times daily. 01/20/16  Yes Tat, Shanon Brow, MD  ondansetron (ZOFRAN ODT) 4 MG disintegrating tablet Take 1 tablet (4 mg total) by mouth every 8 (eight) hours as needed for nausea or  vomiting. 07/07/16  Yes Everlene Balls, MD  potassium chloride SA (K-DUR,KLOR-CON) 10 MEQ tablet Take 1 tablet (10 mEq total) by mouth daily. 04/25/16  Yes Robbie Lis, MD  vitamin B-12 (CYANOCOBALAMIN) 1000 MCG tablet Take 1,000 mcg by mouth 2 (two) times daily.   Yes [provider]  OXYGEN Inhale 2.5 L into the lungs continuous.    [provider]    Physical Exam: Vitals:   07/24/16 2115 07/24/16 2136 07/24/16 2154 07/24/16 2230  BP: 127/80 125/85  129/86  Pulse: (!) 58 61  65  Resp: (!) 28 (!) 21  (!) 21  Temp:      TempSrc:      SpO2: 95% 100%  100%  Weight:   59.1 kg (130 lb 3.2 oz)   Height:        Constitutional: NAD, calm, comfortable Eyes: PERRL, lids and conjunctivae normal ENMT: Mucous membranes are moist. Posterior pharynx clear of any exudate or lesions.Normal dentition.  Neck: normal, supple, no masses, no thyromegaly Respiratory: Bibasilar Rales Cardiovascular: Regular rate and rhythm, no murmurs / rubs / gallops. 3+ BLE edema. 2+ pedal pulses. No carotid bruits.  Abdomen: no tenderness, no masses palpated. No hepatosplenomegaly. Bowel sounds positive.  Musculoskeletal: no clubbing / cyanosis. No joint deformity upper and lower extremities. Good ROM, no contractures. Normal muscle tone.  Skin: no rashes, lesions,  ulcers. No induration Neurologic: CN 2-12 grossly intact. Sensation intact, DTR normal. Strength 5/5 in all 4.  Psychiatric: Normal judgment and insight. Alert and oriented x 3. Normal mood.    Labs on Admission: I have personally reviewed following labs and imaging studies  CBC:  Recent Labs Lab 07/24/16 2144  WBC 3.2*  NEUTROABS 1.5*  HGB 8.8*  HCT 28.4*  MCV 98.3  PLT 254*   Basic Metabolic Panel:  Recent Labs Lab 07/24/16 2144  NA 134*  K 4.7  CL 101  CO2 22  GLUCOSE 93  BUN 25*  CREATININE 1.53*  CALCIUM 9.1   GFR: Estimated Creatinine Clearance: 30.5 mL/min (A) (by C-G formula based on SCr of 1.53 mg/dL (H)). Liver Function Tests:  Recent Labs Lab 07/24/16 2144  AST 32  ALT 151*  ALKPHOS 149*  BILITOT 1.4*  PROT 7.5  ALBUMIN 3.3*   No results for input(s): LIPASE, AMYLASE in the last 168 hours. No results for input(s): AMMONIA in the last 168 hours. Coagulation Profile: No results for input(s): INR, PROTIME in the last 168 hours. Cardiac Enzymes: No results for input(s): CKTOTAL, CKMB, CKMBINDEX, TROPONINI in the last 168 hours. BNP (last 3 results) No results for input(s): PROBNP in the last 8760 hours. HbA1C: No results for input(s): HGBA1C in the last 72 hours. CBG: No results for input(s): GLUCAP in the last 168 hours. Lipid Profile: No results for input(s): CHOL, HDL, LDLCALC, TRIG, CHOLHDL, LDLDIRECT in the last 72 hours. Thyroid Function Tests: No results for input(s): TSH, T4TOTAL, FREET4, T3FREE, THYROIDAB in the last 72 hours. Anemia Panel: No results for input(s): VITAMINB12, FOLATE, FERRITIN, TIBC, IRON, RETICCTPCT in the last 72 hours. Urine analysis:    Component Value Date/Time   COLORURINE YELLOW 07/24/2016 [place] 07/24/2016 2218   LABSPEC 1.010 07/24/2016 2218   LABSPEC 1.015 05/03/2011 0950   PHURINE 5.0 07/24/2016 2218   GLUCOSEU NEGATIVE 07/24/2016 2218   HGBUR LARGE (A) 07/24/2016 2218    BILIRUBINUR NEGATIVE 07/24/2016 2218   BILIRUBINUR Color Interference 05/03/2011 [place] 07/24/2016 2218  PROTEINUR NEGATIVE 07/24/2016 2218   UROBILINOGEN 0.2 11/10/2014 1815   NITRITE NEGATIVE 07/24/2016 2218   LEUKOCYTESUR NEGATIVE 07/24/2016 2218   LEUKOCYTESUR Color Interference 05/03/2011 0950    Radiological Exams on Admission: Dg Chest 2 View  Result Date: 07/24/2016 CLINICAL DATA:  Shortness of breath. EXAM: CHEST  2 VIEW COMPARISON:  July 06, 2016 FINDINGS: No pneumothorax. Cardiomegaly. Bilateral pulmonary opacities could represent developing infiltrate or edema. AICD device is identified. No other acute abnormalities are seen. IMPRESSION: Cardiomegaly. Bilateral pulmonary opacities. The pulmonary opacities are not specific based on imaging. History of shortness of breath with leg swelling and no cough suggests edema. Developing infiltrate not excluded on this study. Recommend clinical correlation and follow-up to resolution. Electronically Signed   By: Dorise Bullion III M.D   On: 07/24/2016 17:33    EKG: Independently reviewed.  Assessment/Plan Principal Problem:   Acute on chronic combined systolic and diastolic CHF (congestive heart failure) (HCC) Active Problems:   Chronic a-fib (HCC)   Chronic renal disease, stage III   Cardiomyopathy, ischemic   Acute on chronic respiratory failure ([place])    1. Acute on chronic combined CHF - 1. CHF pathway 2. Lasix '60mg'$  IV BID 3. Daily BMP 4. Strict intake and output 5. Tele monitor 6. EF known to be 15% 2. Acute on chronic resp failure - 1. Due to acute CHF on top of COPD 2. Continue home nebs 3. O2 as needed 3. Chronic A.Fib - 1. Continue metoprolol 2. Continue Eliquis 4. CKD stage 3 - 1. Chronic 2. Daily BMP while diuresing  DVT prophylaxis: Eliquis Code Status: DNR Family Communication: No family in room Disposition Plan: Home with Hospice Consults called: None Admission status: Place in  [place], [place] [facility] Pager [phone]  If 7AM-7PM, please contact day team taking care of patient [email or web address] Password [email or web address]  07/25/2016, 12:00 AM

## 2016-07-24 NOTE — ED Notes (Signed)
Pt aware of the need of a urine sample. Pt has urinal at bedside.

## 2016-07-25 ENCOUNTER — Encounter (HOSPITAL_COMMUNITY): Payer: Self-pay | Admitting: General Practice

## 2016-07-25 DIAGNOSIS — I2510 Atherosclerotic heart disease of native coronary artery without angina pectoris: Secondary | ICD-10-CM | POA: Diagnosis present

## 2016-07-25 DIAGNOSIS — I255 Ischemic cardiomyopathy: Secondary | ICD-10-CM | POA: Diagnosis present

## 2016-07-25 DIAGNOSIS — E1122 Type 2 diabetes mellitus with diabetic chronic kidney disease: Secondary | ICD-10-CM | POA: Diagnosis present

## 2016-07-25 DIAGNOSIS — I130 Hypertensive heart and chronic kidney disease with heart failure and stage 1 through stage 4 chronic kidney disease, or unspecified chronic kidney disease: Secondary | ICD-10-CM | POA: Diagnosis present

## 2016-07-25 DIAGNOSIS — Z85118 Personal history of other malignant neoplasm of bronchus and lung: Secondary | ICD-10-CM | POA: Diagnosis not present

## 2016-07-25 DIAGNOSIS — J9621 Acute and chronic respiratory failure with hypoxia: Secondary | ICD-10-CM | POA: Diagnosis not present

## 2016-07-25 DIAGNOSIS — Z888 Allergy status to other drugs, medicaments and biological substances status: Secondary | ICD-10-CM | POA: Diagnosis not present

## 2016-07-25 DIAGNOSIS — Z886 Allergy status to analgesic agent status: Secondary | ICD-10-CM | POA: Diagnosis not present

## 2016-07-25 DIAGNOSIS — I482 Chronic atrial fibrillation: Secondary | ICD-10-CM | POA: Diagnosis not present

## 2016-07-25 DIAGNOSIS — Z95810 Presence of automatic (implantable) cardiac defibrillator: Secondary | ICD-10-CM | POA: Diagnosis not present

## 2016-07-25 DIAGNOSIS — Z951 Presence of aortocoronary bypass graft: Secondary | ICD-10-CM | POA: Diagnosis not present

## 2016-07-25 DIAGNOSIS — J449 Chronic obstructive pulmonary disease, unspecified: Secondary | ICD-10-CM | POA: Diagnosis present

## 2016-07-25 DIAGNOSIS — Z7901 Long term (current) use of anticoagulants: Secondary | ICD-10-CM | POA: Diagnosis not present

## 2016-07-25 DIAGNOSIS — Z955 Presence of coronary angioplasty implant and graft: Secondary | ICD-10-CM | POA: Diagnosis not present

## 2016-07-25 DIAGNOSIS — Z9981 Dependence on supplemental oxygen: Secondary | ICD-10-CM | POA: Diagnosis not present

## 2016-07-25 DIAGNOSIS — Z66 Do not resuscitate: Secondary | ICD-10-CM | POA: Diagnosis present

## 2016-07-25 DIAGNOSIS — Z87891 Personal history of nicotine dependence: Secondary | ICD-10-CM | POA: Diagnosis not present

## 2016-07-25 DIAGNOSIS — J9620 Acute and chronic respiratory failure, unspecified whether with hypoxia or hypercapnia: Secondary | ICD-10-CM | POA: Diagnosis present

## 2016-07-25 DIAGNOSIS — Z7951 Long term (current) use of inhaled steroids: Secondary | ICD-10-CM | POA: Diagnosis not present

## 2016-07-25 DIAGNOSIS — N183 Chronic kidney disease, stage 3 (moderate): Secondary | ICD-10-CM | POA: Diagnosis not present

## 2016-07-25 DIAGNOSIS — J60 Coalworker's pneumoconiosis: Secondary | ICD-10-CM | POA: Diagnosis present

## 2016-07-25 DIAGNOSIS — Z880 Allergy status to penicillin: Secondary | ICD-10-CM | POA: Diagnosis not present

## 2016-07-25 DIAGNOSIS — Z8673 Personal history of transient ischemic attack (TIA), and cerebral infarction without residual deficits: Secondary | ICD-10-CM | POA: Diagnosis not present

## 2016-07-25 DIAGNOSIS — I5043 Acute on chronic combined systolic (congestive) and diastolic (congestive) heart failure: Secondary | ICD-10-CM | POA: Diagnosis not present

## 2016-07-25 DIAGNOSIS — Z8546 Personal history of malignant neoplasm of prostate: Secondary | ICD-10-CM | POA: Diagnosis not present

## 2016-07-25 DIAGNOSIS — I509 Heart failure, unspecified: Secondary | ICD-10-CM | POA: Diagnosis present

## 2016-07-25 LAB — BASIC METABOLIC PANEL
Anion gap: 10 (ref 5–15)
BUN: 27 mg/dL — ABNORMAL HIGH (ref 6–20)
CHLORIDE: 100 mmol/L — AB (ref 101–111)
CO2: 25 mmol/L (ref 22–32)
Calcium: 9.1 mg/dL (ref 8.9–10.3)
Creatinine, Ser: 1.57 mg/dL — ABNORMAL HIGH (ref 0.61–1.24)
GFR calc Af Amer: 46 mL/min — ABNORMAL LOW (ref >60)
GFR calc non Af Amer: 40 mL/min — ABNORMAL LOW (ref >60)
GLUCOSE: 77 mg/dL (ref 65–99)
POTASSIUM: 4.6 mmol/L (ref 3.5–5.1)
Sodium: 135 mmol/L (ref 135–145)

## 2016-07-25 LAB — GLUCOSE, CAPILLARY: Glucose-Capillary: 67 mg/dL (ref 65–99)

## 2016-07-25 MED ORDER — LIVING BETTER WITH HEART FAILURE BOOK
Freq: Once | Status: AC
  Administered 2016-07-25: 07:00:00

## 2016-07-25 MED ORDER — ENSURE ENLIVE PO LIQD
237.0000 mL | Freq: Two times a day (BID) | ORAL | Status: DC
  Administered 2016-07-25 – 2016-07-26 (×3): 237 mL via ORAL

## 2016-07-25 NOTE — Progress Notes (Addendum)
PROGRESS NOTE    [name]  NTI:[id number] DOB: 07-Jun-1932 DOA: 07/24/2016 PCP: [name], MD   Outpatient Specialists:     Brief Narrative:  [name] is a 83 y.o. male with medical history significant of ICM with EF 15%, severe lung disease due to COPD, black lung disease, on 2.5L home O2.  Patient presents to the ED with c/o SOB.  Associated BLE edema.  Symptoms have been progressively worsening since onset a couple of weeks ago.  Patients lasix was decreased to '40mg'$  daily in Feb after admission for AKF and hypotension.  Patient was told by PCP to increase lasix to 80 at home (not clear on wether 40 BID or 80 daily at home).  He did this for 2 days last week and then again yesterday and today.  Last week this seemed to help.  The double dosage for last 2 days hasnt helped.   Assessment & Plan:   Principal Problem:   Acute on chronic combined systolic and diastolic CHF (congestive heart failure) (HCC) Active Problems:   Chronic a-fib (HCC)   Chronic renal disease, stage III   Cardiomyopathy, ischemic   Acute on chronic respiratory failure (HCC)   Acute on chronic combined CHF - 1. Lasix '60mg'$  IV BID 2. Daily BMP 3. Strict intake and output 4. Daily weights 5. EF known to be 15%-- on hospice 6. Still not back to baseline- LE edema and SOB 2. Acute on chronic resp failure - 1. Due to acute CHF on top of COPD 2. Continue home nebs 3. O2 - 2.5L at home 3. Chronic A.Fib - 1. Continue metoprolol 2. Continue Eliquis 4. CKD stage 3 - 1. Chronic 2. Daily BMP while diuresing   DVT prophylaxis:  Fully anticoagulated  Code Status: DNR   Family Communication:   Disposition Plan:  Home in AM   Consultants:      Subjective: Still with left Lower leg swelling  Objective: Vitals:   07/25/16 0015 07/25/16 0100 07/25/16 0300 07/25/16 0636  BP:  123/62  130/77  Pulse: 62 63  63  Resp: '19 19  18  '$ Temp:  97.6 F (36.4 C)  97.5 F (36.4 C)  TempSrc:   Oral  Oral  SpO2: 100% 100% 100% 100%  Weight:  59 kg (130 lb)  56.7 kg (124 lb 14.4 oz)  Height:        Intake/Output Summary (Last 24 hours) at 07/25/16 1203 Last data filed at 07/25/16 1151  Gross per 24 hour  Intake              123 ml  Output             2275 ml  Net            -2152 ml   Filed Weights   07/24/16 2154 07/25/16 0100 07/25/16 0636  Weight: 59.1 kg (130 lb 3.2 oz) 59 kg (130 lb) 56.7 kg (124 lb 14.4 oz)    Examination:  General exam: Appears calm and comfortable- speech difficult to understand  Respiratory system: diminished Cardiovascular system: RRR. No JVD, murmurs, rubs, gallops or clicks. Edema in left Lower leg. Gastrointestinal system: Abdomen is nondistended, soft and nontender. No organomegaly or masses felt. Normal bowel sounds heard. Central nervous system: Alert and oriented     Data Reviewed: I have personally reviewed following labs and imaging studies  CBC:  Recent Labs Lab 07/24/16 2144  WBC 3.2*  NEUTROABS 1.5*  HGB 8.8*  HCT 28.4*  MCV 98.3  PLT 614*   Basic Metabolic Panel:  Recent Labs Lab 07/24/16 2144 07/25/16 0550  NA 134* 135  K 4.7 4.6  CL 101 100*  CO2 22 25  GLUCOSE 93 77  BUN 25* 27*  CREATININE 1.53* 1.57*  CALCIUM 9.1 9.1   GFR: Estimated Creatinine Clearance: 29.6 mL/min (A) (by C-G formula based on SCr of 1.57 mg/dL (H)). Liver Function Tests:  Recent Labs Lab 07/24/16 2144  AST 32  ALT 151*  ALKPHOS 149*  BILITOT 1.4*  PROT 7.5  ALBUMIN 3.3*   No results for input(s): LIPASE, AMYLASE in the last 168 hours. No results for input(s): AMMONIA in the last 168 hours. Coagulation Profile: No results for input(s): INR, PROTIME in the last 168 hours. Cardiac Enzymes: No results for input(s): CKTOTAL, CKMB, CKMBINDEX, TROPONINI in the last 168 hours. BNP (last 3 results) No results for input(s): PROBNP in the last 8760 hours. HbA1C: No results for input(s): HGBA1C in the last 72  hours. CBG:  Recent Labs Lab 07/25/16 0723  GLUCAP 67   Lipid Profile: No results for input(s): CHOL, HDL, LDLCALC, TRIG, CHOLHDL, LDLDIRECT in the last 72 hours. Thyroid Function Tests: No results for input(s): TSH, T4TOTAL, FREET4, T3FREE, THYROIDAB in the last 72 hours. Anemia Panel: No results for input(s): VITAMINB12, FOLATE, FERRITIN, TIBC, IRON, RETICCTPCT in the last 72 hours. Urine analysis:    Component Value Date/Time   COLORURINE YELLOW 07/24/2016 2218   APPEARANCEUR CLEAR 07/24/2016 2218   LABSPEC 1.010 07/24/2016 2218   LABSPEC 1.015 05/03/2011 0950   PHURINE 5.0 07/24/2016 2218   GLUCOSEU NEGATIVE 07/24/2016 2218   HGBUR LARGE (A) 07/24/2016 2218   BILIRUBINUR NEGATIVE 07/24/2016 2218   BILIRUBINUR Color Interference 05/03/2011 0950   KETONESUR NEGATIVE 07/24/2016 2218   PROTEINUR NEGATIVE 07/24/2016 2218   UROBILINOGEN 0.2 11/10/2014 1815   NITRITE NEGATIVE 07/24/2016 2218   LEUKOCYTESUR NEGATIVE 07/24/2016 2218   LEUKOCYTESUR Color Interference 05/03/2011 0950     )No results found for this or any previous visit (from the past 240 hour(s)).    Anti-infectives    None       Radiology Studies: Dg Chest 2 View  Result Date: 07/24/2016 CLINICAL DATA:  Shortness of breath. EXAM: CHEST  2 VIEW COMPARISON:  July 06, 2016 FINDINGS: No pneumothorax. Cardiomegaly. Bilateral pulmonary opacities could represent developing infiltrate or edema. AICD device is identified. No other acute abnormalities are seen. IMPRESSION: Cardiomegaly. Bilateral pulmonary opacities. The pulmonary opacities are not specific based on imaging. History of shortness of breath with leg swelling and no cough suggests edema. Developing infiltrate not excluded on this study. Recommend clinical correlation and follow-up to resolution. Electronically Signed   By: Dorise Bullion III M.D   On: 07/24/2016 17:33        Scheduled Meds: . allopurinol  300 mg Oral Daily  . apixaban  2.5 mg  Oral BID  . furosemide  60 mg Intravenous BID  . isosorbide mononitrate  30 mg Oral Daily  . metoprolol succinate  25 mg Oral Daily  . pantoprazole  80 mg Oral Daily  . sodium chloride flush  3 mL Intravenous Q12H  . vitamin B-12  1,000 mcg Oral BID   Continuous Infusions: . sodium chloride       LOS: 0 days    Time spent: 25 min    [place], DO [facility] Pager [phone]  If 7PM-7AM, please contact night-coverage [email or web address] Password [facility] 07/25/2016, 12:03  PM

## 2016-07-25 NOTE — Care Management Note (Addendum)
Case Management Note  Patient Details  Name: [name] MRN: [id number] Date of Birth: 1933-03-03  Subjective/Objective: Pt presented for Acute on Chronic CHF. Pt is from home with Hospice Services via [facility] [place]. Plan will be to return home once stable. Pt may d/c 07-26-16.                    Action/Plan: CM will continue to monitor for additional needs.   Expected Discharge Date:                  Expected Discharge Plan:  Home w Hospice Care  In-House Referral:  NA  Discharge planning Services  CM Consult  Post Acute Care Choice:  Hospice, Resumption of Svcs/PTA Provider Choice offered to:  Patient  DME Arranged:  N/A DME Agency:  NA  HH Arranged:  RN [place] Agency:  [facility]  Status of Service:  Completed, signed off  If discussed at [place] of Stay Meetings, dates discussed:    Additional Comments: 1139 07-26-16 Jacqlyn Krauss, RN, BSN [phone] Pt's family to pick up after 5:00 pm/ CM did call [facility] [place] to make them aware. No further needs from CM at this time.  Bethena Roys, RN 07/25/2016, 1:59 PM

## 2016-07-25 NOTE — Progress Notes (Signed)
[place] [facility] RN visit.  This is a related, covered GIP admission of 07/25/16 with [facility] diagnosis of CHF, per Dr. Tomasa Hosteller.  Patient has an [place] DNR.  Patient was having SOB and BLE worsening edema and decided to come to Emergency room without notifying [facility].    Visited with patient at bedside.  Patient had no visitors in the room with him.  Patient was alert and oriented.  Patient was eating lunch with no difficulties.  Patient in NAD.  Patient states he is "feeling better and my legs have gone down".  Per Dr. Eliseo Squires, patient will stay until tomorrow and will be discharged home.   Patient is receiving: furosemide (LASIX) injection 60 mg, Dose 60 mg, BID via IV.  Continuous medication:  0.9% sodium chloride infusion, Dose 250 mL, PRN via IV.  Patient has received no additional PRN medications today.  Dr. Tomasa Hosteller and Dr. Jeneen Rinks [facility]'s office has been notified of patient admission.  Transfer summary and medication list will be placed on shadow chart.  Will continue to monitor patient while in the hospital and anticipate any discharge needs.  Thank you,  Edyth Gunnels, RN, BSN [facility] Liaison [phone]   All hospital liaisons are now on [place].

## 2016-07-26 DIAGNOSIS — I482 Chronic atrial fibrillation: Secondary | ICD-10-CM

## 2016-07-26 LAB — BASIC METABOLIC PANEL
ANION GAP: 12 (ref 5–15)
BUN: 32 mg/dL — ABNORMAL HIGH (ref 6–20)
CALCIUM: 8.7 mg/dL — AB (ref 8.9–10.3)
CO2: 25 mmol/L (ref 22–32)
CREATININE: 1.56 mg/dL — AB (ref 0.61–1.24)
Chloride: 94 mmol/L — ABNORMAL LOW (ref 101–111)
GFR, EST AFRICAN AMERICAN: 46 mL/min — AB (ref >60)
GFR, EST NON AFRICAN AMERICAN: 40 mL/min — AB (ref >60)
GLUCOSE: 96 mg/dL (ref 65–99)
Potassium: 3.9 mmol/L (ref 3.5–5.1)
Sodium: 131 mmol/L — ABNORMAL LOW (ref 135–145)

## 2016-07-26 MED ORDER — ENSURE ENLIVE PO LIQD: 237.0000 mL | Freq: Two times a day (BID) | ORAL | 12 refills | Status: AC

## 2016-07-26 MED ORDER — FUROSEMIDE 40 MG PO TABS
40.0000 mg | ORAL_TABLET | Freq: Two times a day (BID) | ORAL | Status: DC
  Administered 2016-07-26: 40 mg via ORAL
  Filled 2016-07-26: qty 1

## 2016-07-26 MED ORDER — HYDROCORTISONE 1 % EX CREA: 1.0000 "application " | TOPICAL_CREAM | Freq: Two times a day (BID) | CUTANEOUS | Status: AC | PRN

## 2016-07-26 MED ORDER — FUROSEMIDE 40 MG PO TABS: 40.0000 mg | ORAL_TABLET | Freq: Two times a day (BID) | ORAL | 0 refills | Status: DC

## 2016-07-26 NOTE — Consult Note (Signed)
   [facility] CM Inpatient Consult   07/26/2016  LOGIN MUCKLEROY 05-28-1932 [id number]   Patient showing in the [place] for multiple admissions and ED visits.  Chart review reveals the patient remains active with [facility] [place].  No [facility] needs as the patient is receiving full care management in the [facility] program.  Will sign off.  For questions, please contact:  Natividad Brood, RN BSN [facility] Liaison  [phone] business mobile phone Toll free office [phone]

## 2016-07-26 NOTE — Discharge Summary (Signed)
Physician Discharge Summary  [name] WCH:[id number] DOB: 1932/09/08 DOA: 07/24/2016  PCP: [name], MD  Admit date: 07/24/2016 Discharge date: 07/26/2016   Recommendations for Outpatient Follow-Up:   1. Resume hospice 2. Lasix increased-- may need to decreased based on weight/fluid status 3. Elevate extremities   Discharge Diagnosis:   Principal Problem:   Acute on chronic combined systolic and diastolic CHF (congestive heart failure) (HCC) Active Problems:   Chronic a-fib (HCC)   Chronic renal disease, stage III   Cardiomyopathy, ischemic   Acute on chronic respiratory failure (HCC)   Acute on chronic systolic CHF (congestive heart failure) ([place])   Discharge disposition:  Home.  Discharge Condition: Improved.  Diet recommendation: Low sodium, heart healthy  Wound care: None.   History of Present Illness:   [name] is a 83 y.o. male with medical history significant of ICM with EF 15%, severe lung disease due to COPD, black lung disease, on 2.5L home O2.  Patient presents to the ED with c/o SOB.  Associated BLE edema.  Symptoms have been progressively worsening since onset a couple of weeks ago.  Patients lasix was decreased to '40mg'$  daily in Feb after admission for AKF and hypotension.  Patient was told by PCP to increase lasix to 80 at home (not clear on wether 40 BID or 80 daily at home).  He did this for 2 days last week and then again yesterday and today.  Last week this seemed to help.  The double dosage for last 2 days hasnt helped   Hospital Course by Problem:   Acute on chronic combined CHF - Lasix '60mg'$  IV BID-- changed to lasix 40 BID (may need to be changed back to daily if volume status changes) Daily weights EF known to be 15%-- on hospice   Acute on chronic resp failure - Due to acute CHF on top of COPD Continue home nebs O2 - 2.5L at home  Chronic A.Fib - Continue metoprolol Continue Eliquis  CKD stage 3  - Chronic     Medical Consultants:    None.   Discharge Exam:   Vitals:   07/25/16 1500 07/25/16 2200  BP: 111/69   Pulse: 66 65  Resp: 18   Temp: 97.7 F (36.5 C)    Vitals:   07/25/16 0636 07/25/16 1500 07/25/16 2200 07/26/16 0440  BP: 130/77 111/69    Pulse: 63 66 65   Resp: 18 18    Temp: 97.5 F (36.4 C) 97.7 F (36.5 C)    TempSrc: Oral Oral    SpO2: 100% 100% 100%   Weight: 56.7 kg (124 lb 14.4 oz)   51.6 kg (113 lb 11.2 oz)  Height:        Gen:  NAD    The results of significant diagnostics from this hospitalization (including imaging, microbiology, ancillary and laboratory) are listed below for reference.     Procedures and Diagnostic Studies:   Dg Chest 2 View  Result Date: 07/24/2016 CLINICAL DATA:  Shortness of breath. EXAM: CHEST  2 VIEW COMPARISON:  July 06, 2016 FINDINGS: No pneumothorax. Cardiomegaly. Bilateral pulmonary opacities could represent developing infiltrate or edema. AICD device is identified. No other acute abnormalities are seen. IMPRESSION: Cardiomegaly. Bilateral pulmonary opacities. The pulmonary opacities are not specific based on imaging. History of shortness of breath with leg swelling and no cough suggests edema. Developing infiltrate not excluded on this study. Recommend clinical correlation and follow-up to resolution. Electronically Signed   By: Shanon Brow  Jimmye Norman III M.D   On: 07/24/2016 17:33     Labs:   Basic Metabolic Panel:  Recent Labs Lab 07/24/16 2144 07/25/16 0550 07/26/16 0337  NA 134* 135 131*  K 4.7 4.6 3.9  CL 101 100* 94*  CO2 '22 25 25  '$ GLUCOSE 93 77 96  BUN 25* 27* 32*  CREATININE 1.53* 1.57* 1.56*  CALCIUM 9.1 9.1 8.7*   GFR Estimated Creatinine Clearance: 27.1 mL/min (A) (by C-G formula based on SCr of 1.56 mg/dL (H)). Liver Function Tests:  Recent Labs Lab 07/24/16 2144  AST 32  ALT 151*  ALKPHOS 149*  BILITOT 1.4*  PROT 7.5  ALBUMIN 3.3*   No results for input(s): LIPASE, AMYLASE  in the last 168 hours. No results for input(s): AMMONIA in the last 168 hours. Coagulation profile No results for input(s): INR, PROTIME in the last 168 hours.  CBC:  Recent Labs Lab 07/24/16 2144  WBC 3.2*  NEUTROABS 1.5*  HGB 8.8*  HCT 28.4*  MCV 98.3  PLT 103*   Cardiac Enzymes: No results for input(s): CKTOTAL, CKMB, CKMBINDEX, TROPONINI in the last 168 hours. BNP: Invalid input(s): POCBNP CBG:  Recent Labs Lab 07/25/16 0723  GLUCAP 67   D-Dimer No results for input(s): DDIMER in the last 72 hours. Hgb A1c No results for input(s): HGBA1C in the last 72 hours. Lipid Profile No results for input(s): CHOL, HDL, LDLCALC, TRIG, CHOLHDL, LDLDIRECT in the last 72 hours. Thyroid function studies No results for input(s): TSH, T4TOTAL, T3FREE, THYROIDAB in the last 72 hours.  Invalid input(s): FREET3 Anemia work up No results for input(s): VITAMINB12, FOLATE, FERRITIN, TIBC, IRON, RETICCTPCT in the last 72 hours. Microbiology No results found for this or any previous visit (from the past 240 hour(s)).   Discharge Instructions:   Discharge Instructions    Diet - low sodium heart healthy    Complete by:  As directed    Discharge instructions    Complete by:  As directed    Resume hospice Keep legs elevated   Increase activity slowly    Complete by:  As directed      Allergies as of 07/26/2016      Reactions   [place] [digoxin] Other (See Comments)   Caused patient to have chest pains and dizzy spells   Aspirin Hives   Penicillins Rash   Rash Has patient had a PCN reaction causing immediate rash, facial/tongue/throat swelling, SOB or lightheadedness with hypotension:YES Has patient had a PCN reaction causing severe rash involving mucus membranes or skin necrosis: NO Has patient had a PCN reaction that required hospitalization NO Has patient had a PCN reaction occurring within the last 10 years:NO If all of the above answers are "NO", then may proceed with  Cephalosporin use.      Medication List    TAKE these medications   albuterol (2.5 MG/3ML) 0.083% nebulizer solution Commonly known as:  PROVENTIL Take 2.5 mg by nebulization every 6 (six) hours as needed for wheezing or shortness of breath.   albuterol 108 (90 Base) MCG/ACT inhaler Commonly known as:  PROVENTIL HFA;VENTOLIN HFA Inhale 2 puffs into the lungs every 4 (four) hours as needed for wheezing or shortness of breath.   allopurinol 300 MG tablet Commonly known as:  ZYLOPRIM Take 300 mg by mouth daily.   CALCIUM-VITAMIN D PO Take 1 tablet by mouth 2 (two) times daily.   ELIQUIS 2.5 MG Tabs tablet Generic drug:  apixaban Take 1 tablet by mouth 2 (two)  times daily.   feeding supplement (ENSURE ENLIVE) Liqd Take 237 mLs by mouth 2 (two) times daily between meals.   Fish Oil 1000 MG Caps Take 1,000 mg by mouth at bedtime.   furosemide 40 MG tablet Commonly known as:  LASIX Take 1 tablet (40 mg total) by mouth 2 (two) times daily. What changed:  when to take this   hydrocortisone cream 1 % Apply 1 application topically 2 (two) times daily as needed for itching.   isosorbide mononitrate 30 MG 24 hr tablet Commonly known as:  IMDUR Take 30 mg by mouth daily.   metoprolol succinate 25 MG 24 hr tablet Commonly known as:  TOPROL-XL Take 25 mg by mouth daily.   nitroGLYCERIN 0.4 MG SL tablet Commonly known as:  NITROSTAT Place 0.4 mg under the tongue every 5 (five) minutes as needed for chest pain.   omeprazole 40 MG capsule Commonly known as:  PRILOSEC Take 1 capsule (40 mg total) by mouth 2 (two) times daily.   ondansetron 4 MG disintegrating tablet Commonly known as:  ZOFRAN ODT Take 1 tablet (4 mg total) by mouth every 8 (eight) hours as needed for nausea or vomiting.   OXYGEN Inhale 2.5 L into the lungs continuous.   potassium chloride 10 MEQ tablet Commonly known as:  K-DUR,KLOR-CON Take 1 tablet (10 mEq total) by mouth daily.   vitamin B-12 1000 MCG  tablet Commonly known as:  CYANOCOBALAMIN Take 1,000 mcg by mouth 2 (two) times daily.      Follow-up Information    [name], MD Follow up.   Specialty:  Internal Medicine Contact information: [address] [place] [address] [phone]            Time coordinating discharge: 35 min  Signed:  JESSICA Alison Stalling   [facility] 07/26/2016, 10:08 AM

## 2016-07-26 NOTE — Progress Notes (Addendum)
[facility] RN visit at 0830 am.  This is a related, covered GIP admission of 07/25/16 with [facility] diagnosis of CHF, per Dr. Tomasa Hosteller.  Patient has an [place] DNR.  Patient was having SOB and BLE worsening edema and decided to come to Emergency room without notifying [facility].    Visited with patient at bedside.  Patient was sitting up in recliner and had just finished breakfast.  PT was at bedside and wanted patient to get up and walk and he refused at this time.  Patient was alert and oriented.  Patient in NAD.  Patient denied any pain.   States he feels a lot better.  Discussed with patient going home and how he will manage his medications at home and he advised his daughter helps him.    Patient is receiving:  Furosemide (LASIX) tablet 40 mg, Dose 40 mg, BID via PO.  Continuous medication:  0.9% sodium chloride infusion, Dose 250 mL, Dose as needed via IV.  Patient has received no PRN medications today.  Spoke with Lucita Ferrara, patient's daughter and she advised that she would be the one to pick patient up from the hospital on discharge.   She advised that he does have portable tanks at home and they will bring when they come to pick him up.  Will continue to monitor patient while in the hospital and anticipate any discharge needs.  Thank you,  Edyth Gunnels, RN, BSN [facility] Liaison [phone]   All hospital liaisons are now on [place].

## 2016-07-30 ENCOUNTER — Ambulatory Visit: Admitting: Cardiovascular Disease

## 2016-07-30 NOTE — Progress Notes (Deleted)
Cardiology Office Note   Date:  07/30/2016   ID:  [name], DOB 06/25/1932, MRN [id number]  PCP:  [name] Gravel, MD  Cardiologist:   [name] Breeding, MD    No chief complaint on file.     History of Present Illness: [name] is a 83 y.o. male who presents for follow up of systolic HF (EF 98%)  and CAD status post CABG, atrial fib and history of CRT.  He was in Jan for respiratory failure.  He had exacerbation of COPD and CHF.   He was again in the hospital for acute on chronic systolic a few weeks ago.  I reviewed these records for this visit.   The Lasix was increased at discharge.  ***   Of note during his recent hospitalization he did have a palliative care consult and was made a DNR.   He has done relatively OK.  At home he gets around with a walker and holds onto things.  Today he is very somnolent. His blood pressure is low. He's not weighing himself every day. He's had some physical therapy. There's been only sporadic nurses coming to the house. He drinks about 40 ounces of water and also coffee at home. He's not had any acute shortness of breath, PND or orthopnea. He's not had any palpitations, presyncope or syncope. He's not had any chest pressure, neck or arm discomfort.  He wears oxygen but actually didn't bring it today.   Past Medical History:  Diagnosis Date  . Arthritis   . Black lung disease ([place])   . CAD (coronary artery disease)    a. 07/2002 CABG x 3: LIMA->LAD, VG->Diag, VG->OM;  b. 06/2006 Cath: LM 50-60ost/p, LAD patent mid stent, D1 sev dzs, D2 patent stent, LCX nl, OM2 sev sten prox, RCA large/nl, VG->Diag nl, VG->OM nl, LIMA->LAD atretic, EF 30%.  . Carotid artery occlusion   . Chronic atrial fibrillation ([name])   . Chronic respiratory failure ([name])   . Chronic systolic CHF (congestive heart failure) ([place])    a. 12/2012 Echo: EF 20-25%, mid-dist antsept AK, mod dil LA.  [name] COPD (chronic obstructive pulmonary disease) ([place])    a. On home O2.  [name] CVA  (cerebral vascular accident) ([place])   . Diabetes mellitus   . GERD (gastroesophageal reflux disease)   . Gout   . Hypercholesterolemia   . Hypertension   . Ischemic cardiomyopathy    a. 06/2006  BIV-ICD -> gen change 2015 to [name].  . Lung cancer ([place])   . Nephrolithiasis 09/2000  . On home oxygen therapy    "2.5L; 24/7" (07/25/2016)  . Prostate cancer ([place]) 11/01/10   gleason 7, 8, 9, gold seeds 02/08/11  . Symptomatic bradycardia     Past Surgical History:  Procedure Laterality Date  . BI-VENTRICULAR PACEMAKER INSERTION (CRT-P)  12-02-13   downgrade of previously implanted CRTD to STJ CRTP  . BIV PACEMAKER GENERATOR CHANGE OUT N/A 12/02/2013   Procedure: BIV PACEMAKER GENERATOR CHANGE OUT;  Surgeon: Evans Lance, MD;  Location[facility] CATH LAB;  Service: Cardiovascular;  Laterality: N/A;  . CATARACT EXTRACTION W/ INTRAOCULAR LENS  IMPLANT, BILATERAL Bilateral   . COLONOSCOPY    . CORONARY ANGIOPLASTY WITH STENT PLACEMENT  07/1997; 08/1997;03/1998  . CORONARY ARTERY BYPASS GRAFT  07/2002   CABG X3  . ESOPHAGOGASTRODUODENOSCOPY  02/19/2011   Procedure: ESOPHAGOGASTRODUODENOSCOPY (EGD);  Surgeon: [name] Columbia, MD;  Location: [facility] ENDOSCOPY;  Service: Endoscopy;  Laterality: N/A;  .  ESOPHAGOGASTRODUODENOSCOPY    . FEMORAL-POPLITEAL BYPASS GRAFT Right 01/20/2016   Procedure: THROMBECTOMY OF POPLITEAL ARTERY AND ANTERIOR TIBIAL ARTERY RIGHT LEG; INTRAOPERATIVE ARTERIOGRAM;  Surgeon: [name] Sandy, MD;  Location: [facility];  Service: Vascular;  Laterality: Right;  . INCISION AND DRAINAGE OF WOUND  08/2002   right thigh; S/P EVH  . INSERT / REPLACE / [place]; 1992; 01/2000;  . INSERT / REPLACE / REMOVE PACEMAKER  09/2003; 06/2006   w/AICD  . INSERT / REPLACE / REMOVE PACEMAKER  12/2004   pacmaker explant  . SHOULDER ARTHROSCOPY W/ ROTATOR CUFF REPAIR  05/2008   left     Current Outpatient Prescriptions  Medication Sig Dispense Refill  . albuterol (PROVENTIL  HFA;VENTOLIN HFA) 108 (90 Base) MCG/ACT inhaler Inhale 2 puffs into the lungs every 4 (four) hours as needed for wheezing or shortness of breath. 1 Inhaler 2  . albuterol (PROVENTIL) (2.5 MG/3ML) 0.083% nebulizer solution Take 2.5 mg by nebulization every 6 (six) hours as needed for wheezing or shortness of breath.    . allopurinol (ZYLOPRIM) 300 MG tablet Take 300 mg by mouth daily.      [name] CALCIUM-VITAMIN D PO Take 1 tablet by mouth 2 (two) times daily.     [name] ELIQUIS 2.5 MG TABS tablet Take 1 tablet by mouth 2 (two) times daily.    . feeding supplement, ENSURE ENLIVE, (ENSURE ENLIVE) LIQD Take 237 mLs by mouth 2 (two) times daily between meals. 237 mL 12  . furosemide (LASIX) 40 MG tablet Take 1 tablet (40 mg total) by mouth 2 (two) times daily. 30 tablet 0  . hydrocortisone cream 1 % Apply 1 application topically 2 (two) times daily as needed for itching.    . isosorbide mononitrate (IMDUR) 30 MG 24 hr tablet Take 30 mg by mouth daily.    . metoprolol succinate (TOPROL-XL) 25 MG 24 hr tablet Take 25 mg by mouth daily.    . nitroGLYCERIN (NITROSTAT) 0.4 MG SL tablet Place 0.4 mg under the tongue every 5 (five) minutes as needed for chest pain.    . Omega-3 Fatty Acids (FISH OIL) 1000 MG CAPS Take 1,000 mg by mouth at bedtime.     [name] omeprazole (PRILOSEC) 40 MG capsule Take 1 capsule (40 mg total) by mouth 2 (two) times daily. 60 capsule 1  . ondansetron (ZOFRAN ODT) 4 MG disintegrating tablet Take 1 tablet (4 mg total) by mouth every 8 (eight) hours as needed for nausea or vomiting. 12 tablet 0  . OXYGEN Inhale 2.5 L into the lungs continuous.    . potassium chloride SA (K-DUR,KLOR-CON) 10 MEQ tablet Take 1 tablet (10 mEq total) by mouth daily. 30 tablet 0  . vitamin B-12 (CYANOCOBALAMIN) 1000 MCG tablet Take 1,000 mcg by mouth 2 (two) times daily.     No current facility-administered medications for this visit.     Allergies:   [place] [digoxin]; Aspirin; and Penicillins    ROS:  Please see  the history of present illness.   Otherwise, review of systems are positive for ***.   All other systems are reviewed and negative.    PHYSICAL EXAM: VS:  There were no vitals taken for this visit. , BMI There is no height or weight on file to calculate BMI.  GENERAL:  Very frail appearing HEENT:  Pupils equal round and reactive, fundi not visualized, oral mucosa unremarkable NECK:  No jugular venous distention, waveform within normal limits, carotid upstroke brisk and symmetric, no  bruits, no thyromegaly LYMPHATICS:  No cervical, inguinal adenopathy LUNGS:  Clear to auscultation bilaterally BACK:  No CVA tenderness CHEST:  Unremarkable HEART:  PMI not displaced or sustained,S1 and S2 within normal limits, no S3, no S4, no clicks, no rubs, *** murmurs ABD:  Flat, positive bowel sounds normal in frequency in pitch, no bruits, no rebound, no guarding, no midline pulsatile mass, no hepatomegaly, no splenomegaly EXT:  2 plus pulses throughout, no edema, no cyanosis no clubbing SKIN:  No rashes no nodules NEURO:  Cranial nerves II through XII grossly intact, motor grossly intact throughout PSYCH:  Cognitively intact, oriented to person place and time  GEN:  No distress, very frail NECK:  No jugular venous distention at 90 degrees, waveform within normal limits, carotid upstroke brisk and symmetric, no bruits, no thyromegaly LYMPHATICS:  No cervical adenopathy LUNGS:  Clear to auscultation bilaterally BACK:  No CVA tenderness CHEST:  Unremarkable HEART:  S1 and S2 within normal limits, no S3, no S4, no clicks, no rubs, no murmurs ABD:  Positive bowel sounds normal in frequency in pitch, no bruits, no rebound, no guarding, unable to assess midline mass or bruit with the patient seated. EXT:  2 plus pulses throughout, moderate edema, no cyanosis no clubbing SKIN:  No rashes no nodules NEURO:  Cranial nerves II through XII grossly intact, motor grossly intact throughout PSYCH:  Cognitively  intact, oriented to person place and time   EKG:  EKG is not  *** ordered today.   Recent Labs: 10/27/2015: TSH 4.655 01/19/2016: Magnesium 2.2 07/24/2016: ALT 151; B Natriuretic Peptide 3,696.9; Hemoglobin 8.8; Platelets 103 07/26/2016: BUN 32; Creatinine, Ser 1.56; Potassium 3.9; Sodium 131    Lipid Panel    Component Value Date/Time   CHOL 159 10/27/2015 0515   TRIG 245 (H) 10/27/2015 0515   HDL 18 (L) 10/27/2015 0515   CHOLHDL 8.8 10/27/2015 0515   VLDL 49 (H) 10/27/2015 0515   LDLCALC 92 10/27/2015 0515      Wt Readings from Last 3 Encounters:  07/26/16 113 lb 11.2 oz (51.6 kg)  07/06/16 135 lb (61.2 kg)  04/25/16 117 lb 6.4 oz (53.3 kg)      Other studies Reviewed: Additional studies/ records that were reviewed today include:   *** Review of the above records demonstrates:  ***   ASSESSMENT AND PLAN:  ACUTE ON CHRONIC SYSTOLIC AND DIASTOLIC HF:   At the last visit I called in had him enrolled in hospice.  He came back to the hospital with volume overload.  ***  I had a discussion with his daughter and I'm going to get hospice nurses into the house. He was very hypotensive today. His blood pressure dropped into the 70s and he became somnolent while I was talking to him. I laid him down for his feet up. He responded quickly blood pressure came up to 92. His oxygen saturation was fine. He was never in distress. We will stop his spironolactone and Cozaar for now but unlikely to restart these in the future. However, he needs frequent nursing visits and blood pressure checks and it would be nice to have some home blood work provide him the opportunity to stay out all the hospital.  CKD:   Labs as above.  ***  I will check a basic metabolic profile today.  HTN:  *** This is being managed in the context of treating his CHF  HYPONATREMIA:    *** He has had hyponatremia. I will check a basic  metabolic profile today.   Current medicines are reviewed at length with the patient  today.  The patient does not have concerns regarding medicines.  The following changes have been made:  ***  Labs/ tests ordered today include:  ***  No orders of the defined types were placed in this encounter.    Disposition:   FU with me in *** months.     Signed, [name] Breeding, MD  07/30/2016 8:47 AM    [facility]

## 2016-07-31 ENCOUNTER — Ambulatory Visit: Admitting: Cardiology

## 2016-08-01 ENCOUNTER — Encounter: Payer: Self-pay | Admitting: *Deleted

## 2016-08-03 ENCOUNTER — Emergency Department (HOSPITAL_COMMUNITY): Payer: Medicare Other

## 2016-08-03 ENCOUNTER — Encounter (HOSPITAL_COMMUNITY): Payer: Self-pay | Admitting: Emergency Medicine

## 2016-08-03 ENCOUNTER — Inpatient Hospital Stay (HOSPITAL_COMMUNITY)
Admission: EM | Admit: 2016-08-03 | Discharge: 2016-08-13 | DRG: 291 | Disposition: A | Discharge status: Home Health | Payer: Medicare Other | Attending: Internal Medicine | Admitting: Internal Medicine

## 2016-08-03 DIAGNOSIS — Z833 Family history of diabetes mellitus: Secondary | ICD-10-CM

## 2016-08-03 DIAGNOSIS — I2510 Atherosclerotic heart disease of native coronary artery without angina pectoris: Secondary | ICD-10-CM | POA: Diagnosis not present

## 2016-08-03 DIAGNOSIS — D649 Anemia, unspecified: Secondary | ICD-10-CM | POA: Diagnosis present

## 2016-08-03 DIAGNOSIS — M109 Gout, unspecified: Secondary | ICD-10-CM | POA: Diagnosis present

## 2016-08-03 DIAGNOSIS — E871 Hypo-osmolality and hyponatremia: Secondary | ICD-10-CM | POA: Diagnosis not present

## 2016-08-03 DIAGNOSIS — I482 Chronic atrial fibrillation, unspecified: Secondary | ICD-10-CM | POA: Diagnosis present

## 2016-08-03 DIAGNOSIS — J60 Coalworker's pneumoconiosis: Secondary | ICD-10-CM | POA: Diagnosis present

## 2016-08-03 DIAGNOSIS — I110 Hypertensive heart disease with heart failure: Secondary | ICD-10-CM | POA: Diagnosis not present

## 2016-08-03 DIAGNOSIS — Z8546 Personal history of malignant neoplasm of prostate: Secondary | ICD-10-CM

## 2016-08-03 DIAGNOSIS — Z8673 Personal history of transient ischemic attack (TIA), and cerebral infarction without residual deficits: Secondary | ICD-10-CM | POA: Diagnosis not present

## 2016-08-03 DIAGNOSIS — Z961 Presence of intraocular lens: Secondary | ICD-10-CM | POA: Diagnosis present

## 2016-08-03 DIAGNOSIS — Z7189 Other specified counseling: Secondary | ICD-10-CM | POA: Diagnosis not present

## 2016-08-03 DIAGNOSIS — R300 Dysuria: Secondary | ICD-10-CM | POA: Diagnosis not present

## 2016-08-03 DIAGNOSIS — J9622 Acute and chronic respiratory failure with hypercapnia: Secondary | ICD-10-CM | POA: Diagnosis not present

## 2016-08-03 DIAGNOSIS — I130 Hypertensive heart and chronic kidney disease with heart failure and stage 1 through stage 4 chronic kidney disease, or unspecified chronic kidney disease: Principal | ICD-10-CM | POA: Diagnosis present

## 2016-08-03 DIAGNOSIS — I5043 Acute on chronic combined systolic (congestive) and diastolic (congestive) heart failure: Secondary | ICD-10-CM | POA: Diagnosis present

## 2016-08-03 DIAGNOSIS — N183 Chronic kidney disease, stage 3 unspecified: Secondary | ICD-10-CM | POA: Diagnosis present

## 2016-08-03 DIAGNOSIS — E118 Type 2 diabetes mellitus with unspecified complications: Secondary | ICD-10-CM | POA: Diagnosis not present

## 2016-08-03 DIAGNOSIS — I255 Ischemic cardiomyopathy: Secondary | ICD-10-CM | POA: Diagnosis present

## 2016-08-03 DIAGNOSIS — Z87891 Personal history of nicotine dependence: Secondary | ICD-10-CM | POA: Diagnosis not present

## 2016-08-03 DIAGNOSIS — E7800 Pure hypercholesterolemia, unspecified: Secondary | ICD-10-CM | POA: Diagnosis not present

## 2016-08-03 DIAGNOSIS — E1122 Type 2 diabetes mellitus with diabetic chronic kidney disease: Secondary | ICD-10-CM | POA: Diagnosis present

## 2016-08-03 DIAGNOSIS — K219 Gastro-esophageal reflux disease without esophagitis: Secondary | ICD-10-CM | POA: Diagnosis present

## 2016-08-03 DIAGNOSIS — D638 Anemia in other chronic diseases classified elsewhere: Secondary | ICD-10-CM | POA: Diagnosis not present

## 2016-08-03 DIAGNOSIS — Z951 Presence of aortocoronary bypass graft: Secondary | ICD-10-CM

## 2016-08-03 DIAGNOSIS — J9620 Acute and chronic respiratory failure, unspecified whether with hypoxia or hypercapnia: Secondary | ICD-10-CM | POA: Diagnosis present

## 2016-08-03 DIAGNOSIS — E875 Hyperkalemia: Secondary | ICD-10-CM | POA: Diagnosis not present

## 2016-08-03 DIAGNOSIS — J9621 Acute and chronic respiratory failure with hypoxia: Secondary | ICD-10-CM | POA: Diagnosis not present

## 2016-08-03 DIAGNOSIS — Z515 Encounter for palliative care: Secondary | ICD-10-CM | POA: Diagnosis not present

## 2016-08-03 DIAGNOSIS — E876 Hypokalemia: Secondary | ICD-10-CM | POA: Diagnosis not present

## 2016-08-03 DIAGNOSIS — Z85118 Personal history of other malignant neoplasm of bronchus and lung: Secondary | ICD-10-CM | POA: Diagnosis not present

## 2016-08-03 DIAGNOSIS — I5021 Acute systolic (congestive) heart failure: Secondary | ICD-10-CM | POA: Diagnosis not present

## 2016-08-03 DIAGNOSIS — Z66 Do not resuscitate: Secondary | ICD-10-CM | POA: Diagnosis not present

## 2016-08-03 DIAGNOSIS — Z9981 Dependence on supplemental oxygen: Secondary | ICD-10-CM

## 2016-08-03 DIAGNOSIS — E119 Type 2 diabetes mellitus without complications: Secondary | ICD-10-CM

## 2016-08-03 DIAGNOSIS — Z955 Presence of coronary angioplasty implant and graft: Secondary | ICD-10-CM

## 2016-08-03 DIAGNOSIS — Z8042 Family history of malignant neoplasm of prostate: Secondary | ICD-10-CM

## 2016-08-03 DIAGNOSIS — J441 Chronic obstructive pulmonary disease with (acute) exacerbation: Secondary | ICD-10-CM | POA: Diagnosis present

## 2016-08-03 DIAGNOSIS — Z950 Presence of cardiac pacemaker: Secondary | ICD-10-CM | POA: Diagnosis not present

## 2016-08-03 DIAGNOSIS — D500 Iron deficiency anemia secondary to blood loss (chronic): Secondary | ICD-10-CM | POA: Diagnosis not present

## 2016-08-03 DIAGNOSIS — N179 Acute kidney failure, unspecified: Secondary | ICD-10-CM | POA: Diagnosis not present

## 2016-08-03 DIAGNOSIS — Z95810 Presence of automatic (implantable) cardiac defibrillator: Secondary | ICD-10-CM

## 2016-08-03 HISTORY — DX: Other disorder of circulatory system: I99.8

## 2016-08-03 HISTORY — DX: Gastrointestinal hemorrhage, unspecified: K92.2

## 2016-08-03 HISTORY — DX: Nonrheumatic aortic (valve) stenosis: I35.0

## 2016-08-03 LAB — BASIC METABOLIC PANEL
Anion gap: 14 (ref 5–15)
BUN: 38 mg/dL — ABNORMAL HIGH (ref 6–20)
CALCIUM: 9.1 mg/dL (ref 8.9–10.3)
CO2: 22 mmol/L (ref 22–32)
Chloride: 95 mmol/L — ABNORMAL LOW (ref 101–111)
Creatinine, Ser: 1.33 mg/dL — ABNORMAL HIGH (ref 0.61–1.24)
GFR, EST AFRICAN AMERICAN: 56 mL/min — AB (ref >60)
GFR, EST NON AFRICAN AMERICAN: 48 mL/min — AB (ref >60)
Glucose, Bld: 95 mg/dL (ref 65–99)
Potassium: 4.6 mmol/L (ref 3.5–5.1)
Sodium: 131 mmol/L — ABNORMAL LOW (ref 135–145)

## 2016-08-03 LAB — CBC
HCT: 27.9 % — ABNORMAL LOW (ref 39.0–52.0)
Hemoglobin: 8.8 g/dL — ABNORMAL LOW (ref 13.0–17.0)
MCH: 30.6 pg (ref 26.0–34.0)
MCHC: 31.5 g/dL (ref 30.0–36.0)
MCV: 96.9 fL (ref 78.0–100.0)
Platelets: 127 10*3/uL — ABNORMAL LOW (ref 150–400)
RBC: 2.88 MIL/uL — AB (ref 4.22–5.81)
RDW: 20.1 % — ABNORMAL HIGH (ref 11.5–15.5)
WBC: 4 10*3/uL (ref 4.0–10.5)

## 2016-08-03 LAB — I-STAT TROPONIN, ED: TROPONIN I, POC: 0.03 ng/mL (ref 0.00–0.08)

## 2016-08-03 LAB — BRAIN NATRIURETIC PEPTIDE: B NATRIURETIC PEPTIDE 5: 2772.1 pg/mL — AB (ref 0.0–100.0)

## 2016-08-03 LAB — TROPONIN I: Troponin I: 0.03 ng/mL (ref <0.03)

## 2016-08-03 MED ORDER — ISOSORBIDE MONONITRATE ER 30 MG PO TB24
30.0000 mg | ORAL_TABLET | Freq: Every day | ORAL | Status: DC
  Administered 2016-08-04 – 2016-08-13 (×10): 30 mg via ORAL
  Filled 2016-08-03 (×10): qty 1

## 2016-08-03 MED ORDER — SODIUM CHLORIDE 0.9 % IV SOLN: 250.0000 mL | INTRAVENOUS | Status: DC | PRN

## 2016-08-03 MED ORDER — PANTOPRAZOLE SODIUM 40 MG PO TBEC
40.0000 mg | DELAYED_RELEASE_TABLET | Freq: Every day | ORAL | Status: DC
  Administered 2016-08-04 – 2016-08-13 (×10): 40 mg via ORAL
  Filled 2016-08-03 (×10): qty 1

## 2016-08-03 MED ORDER — FUROSEMIDE 10 MG/ML IJ SOLN
80.0000 mg | Freq: Once | INTRAMUSCULAR | Status: AC
  Administered 2016-08-03: 80 mg via INTRAVENOUS
  Filled 2016-08-03: qty 8

## 2016-08-03 MED ORDER — FUROSEMIDE 10 MG/ML IJ SOLN
60.0000 mg | Freq: Two times a day (BID) | INTRAMUSCULAR | Status: DC
  Administered 2016-08-04 – 2016-08-05 (×3): 60 mg via INTRAVENOUS
  Filled 2016-08-03 (×4): qty 6

## 2016-08-03 MED ORDER — VITAMIN B-12 1000 MCG PO TABS
1000.0000 ug | ORAL_TABLET | Freq: Two times a day (BID) | ORAL | Status: DC
  Administered 2016-08-03 – 2016-08-13 (×20): 1000 ug via ORAL
  Filled 2016-08-03 (×21): qty 1

## 2016-08-03 MED ORDER — METOPROLOL SUCCINATE ER 25 MG PO TB24
25.0000 mg | ORAL_TABLET | Freq: Every day | ORAL | Status: DC
  Administered 2016-08-04 – 2016-08-13 (×9): 25 mg via ORAL
  Filled 2016-08-03 (×10): qty 1

## 2016-08-03 MED ORDER — ALBUTEROL SULFATE (2.5 MG/3ML) 0.083% IN NEBU
2.5000 mg | INHALATION_SOLUTION | Freq: Four times a day (QID) | RESPIRATORY_TRACT | Status: DC
  Administered 2016-08-03: 2.5 mg via RESPIRATORY_TRACT
  Filled 2016-08-03: qty 3

## 2016-08-03 MED ORDER — ACETAMINOPHEN 325 MG PO TABS
650.0000 mg | ORAL_TABLET | ORAL | Status: DC | PRN
  Administered 2016-08-05 – 2016-08-11 (×3): 650 mg via ORAL
  Filled 2016-08-03 (×3): qty 2

## 2016-08-03 MED ORDER — ONDANSETRON 4 MG PO TBDP
4.0000 mg | ORAL_TABLET | Freq: Three times a day (TID) | ORAL | Status: DC | PRN
Start: 2016-08-03 — End: 2016-08-13

## 2016-08-03 MED ORDER — NITROGLYCERIN 0.4 MG SL SUBL: 0.4000 mg | SUBLINGUAL_TABLET | SUBLINGUAL | Status: DC | PRN

## 2016-08-03 MED ORDER — ALLOPURINOL 300 MG PO TABS
300.0000 mg | ORAL_TABLET | Freq: Every day | ORAL | Status: DC
  Administered 2016-08-04 – 2016-08-13 (×10): 300 mg via ORAL
  Filled 2016-08-03 (×10): qty 1

## 2016-08-03 MED ORDER — ALBUTEROL SULFATE (2.5 MG/3ML) 0.083% IN NEBU
2.5000 mg | INHALATION_SOLUTION | Freq: Four times a day (QID) | RESPIRATORY_TRACT | Status: DC | PRN
Start: 2016-08-03 — End: 2016-08-13
  Administered 2016-08-08: 2.5 mg via RESPIRATORY_TRACT
  Filled 2016-08-03: qty 3

## 2016-08-03 MED ORDER — SODIUM CHLORIDE 0.9% FLUSH
3.0000 mL | Freq: Two times a day (BID) | INTRAVENOUS | Status: DC
  Administered 2016-08-03 – 2016-08-13 (×19): 3 mL via INTRAVENOUS

## 2016-08-03 MED ORDER — POTASSIUM CHLORIDE CRYS ER 10 MEQ PO TBCR
10.0000 meq | EXTENDED_RELEASE_TABLET | Freq: Every day | ORAL | Status: DC
  Administered 2016-08-04 – 2016-08-05 (×2): 10 meq via ORAL
  Filled 2016-08-03 (×2): qty 1

## 2016-08-03 MED ORDER — SODIUM CHLORIDE 0.9% FLUSH: 3.0000 mL | INTRAVENOUS | Status: DC | PRN

## 2016-08-03 MED ORDER — APIXABAN 2.5 MG PO TABS
2.5000 mg | ORAL_TABLET | Freq: Two times a day (BID) | ORAL | Status: DC
  Administered 2016-08-03 – 2016-08-13 (×20): 2.5 mg via ORAL
  Filled 2016-08-03 (×20): qty 1

## 2016-08-03 MED ORDER — ALPRAZOLAM 0.25 MG PO TABS
0.2500 mg | ORAL_TABLET | Freq: Two times a day (BID) | ORAL | Status: DC | PRN
  Administered 2016-08-04 – 2016-08-10 (×2): 0.25 mg via ORAL
  Filled 2016-08-03 (×2): qty 1

## 2016-08-03 MED ORDER — ALBUTEROL SULFATE (2.5 MG/3ML) 0.083% IN NEBU
2.5000 mg | INHALATION_SOLUTION | Freq: Three times a day (TID) | RESPIRATORY_TRACT | Status: DC
  Administered 2016-08-03 – 2016-08-06 (×10): 2.5 mg via RESPIRATORY_TRACT
  Filled 2016-08-03 (×10): qty 3

## 2016-08-03 MED ORDER — ONDANSETRON HCL 4 MG/2ML IJ SOLN: 4.0000 mg | Freq: Four times a day (QID) | INTRAMUSCULAR | Status: DC | PRN

## 2016-08-03 NOTE — H&P (Signed)
History and Physical    [name] PNT:[id number] DOB: 1932/05/14 DOA: 08/03/2016  PCP: [name] Gravel, MD Patient coming from: home  Chief Complaint: dyspnea  HPI: [name] is a 83 y.o. male with medical history significant CM with an EF of 15%, severe lung disease due to COPD, [name] lung disease, A. fib, diabetes, presents to emergency Department chief complaint shortness of breath or lower extremity edema. Initial evaluation reveals acute on chronic respiratory failure likely related to acute on chronic combined systolic diastolic heart failure.  Information is obtained from the patient and the chart. He reports he was doing "okay" for a while after being discharged for the same 5 days ago. He reports he's had gradual worsening in lower extremity edema as well as abdominal swelling. Associated symptoms include worsening shortness of breath intermittent nonproductive cough. He states he has required more oxygen than usual. He denies headache dizziness syncope or near-syncope. He denies chest pain palpitations diaphoresis nausea vomiting. Chart review indicates he was under the care of hospice however he indicates that he "fired them". He reports that they were not allowing him to see his own doctors.   ED Course: In the emergency department he is afebrile hemodynamically stable increased work of breathing mild tachypnea oxygen saturation level 100% on 4 L nasal cannula. He is provided with 80 mg of Lasix.  Review of Systems: As per HPI otherwise all other systems reviewed and are negative.   Ambulatory Status: He denies using a cane or walker at home. He reports a steady gait. No recent falls. Independent with ADLs  Past Medical History:  Diagnosis Date  . Arthritis   . Wiletta Bermingham lung disease ([place])   . CAD (coronary artery disease)    a. 07/2002 CABG x 3: LIMA->LAD, VG->Diag, VG->OM;  b. 06/2006 Cath: LM 50-60ost/p, LAD patent mid stent, D1 sev dzs, D2 patent stent, LCX nl, OM2 sev sten  prox, RCA large/nl, VG->Diag nl, VG->OM nl, LIMA->LAD atretic, EF 30%.  . Carotid artery occlusion   . Chronic atrial fibrillation ([place])   . Chronic respiratory failure ([place])   . Chronic systolic CHF (congestive heart failure) ([place])    a. 12/2012 Echo: EF 20-25%, mid-dist antsept AK, mod dil LA.  [name] COPD (chronic obstructive pulmonary disease) ([place])    a. On home O2.  [name] CVA (cerebral vascular accident) ([place])   . Diabetes mellitus   . GERD (gastroesophageal reflux disease)   . Gout   . Hypercholesterolemia   . Hypertension   . Ischemic cardiomyopathy    a. 06/2006  BIV-ICD -> gen change 2015 to [place].  . Lung cancer ([place])   . Nephrolithiasis 09/2000  . On home oxygen therapy    "2.5L; 24/7" (07/25/2016)  . Prostate cancer ([place]) 11/01/10   gleason 7, 8, 9, gold seeds 02/08/11  . Symptomatic bradycardia     Past Surgical History:  Procedure Laterality Date  . BI-VENTRICULAR PACEMAKER INSERTION (CRT-P)  12-02-13   downgrade of previously implanted CRTD to STJ CRTP  . BIV PACEMAKER GENERATOR CHANGE OUT N/A 12/02/2013   Procedure: BIV PACEMAKER GENERATOR CHANGE OUT;  Surgeon: Evans Lance, MD;  Location[facility] C[facility] LAB;  Service: Cardiovascular;  Laterality: N/A;  . CATARACT EXTRACTION W/ INTRAOCULAR LENS  IMPLANT, BILATERAL Bilateral   . COLONOSCOPY    . CORONARY ANGIOPLASTY WITH STENT PLACEMENT  07/1997; 08/1997;03/1998  . CORONARY ARTERY BYPASS GRAFT  07/2002   CABG X3  . ESOPHAGOGASTRODUODENOSCOPY  02/19/2011   Procedure:  ESOPHAGOGASTRODUODENOSCOPY (EGD);  Surgeon: Jeryl Columbia, MD;  Location: [facility] ENDOSCOPY;  Service: Endoscopy;  Laterality: N/A;  . ESOPHAGOGASTRODUODENOSCOPY    . FEMORAL-POPLITEAL BYPASS GRAFT Right 01/20/2016   Procedure: THROMBECTOMY OF POPLITEAL ARTERY AND ANTERIOR TIBIAL ARTERY RIGHT LEG; INTRAOPERATIVE ARTERIOGRAM;  Surgeon: Waynetta Sandy, MD;  Location: [facility];  Service: Vascular;  Laterality: Right;  . INCISION AND DRAINAGE OF WOUND  08/2002    right thigh; S/P EVH  . INSERT / REPLACE / [place]; 1992; 01/2000;  . INSERT / REPLACE / REMOVE PACEMAKER  09/2003; 06/2006   w/AICD  . INSERT / REPLACE / REMOVE PACEMAKER  12/2004   pacmaker explant  . SHOULDER ARTHROSCOPY W/ ROTATOR CUFF REPAIR  05/2008   left    Social History   Social History  . Marital status: Widowed    Spouse name: N/A  . Number of children: N/A  . Years of education: N/A   Occupational History  . retired Retired    Equities trader   Social History Main Topics  . Smoking status: Former Smoker    Types: Cigarettes    Quit date: 03/12/1949  . Smokeless tobacco: Current User    Types: Chew  . Alcohol use No     Comment: drank until per pt 03/19/11, h/o heavy use  . Drug use: No  . Sexual activity: Yes   Other Topics Concern  . Not on file   Social History Narrative  . No narrative on file    Allergies  Allergen Reactions  . [place] [Digoxin] Other (See Comments)    Caused patient to have chest pains and dizzy spells  . Aspirin Hives  . Penicillins Rash    Rash Has patient had a PCN reaction causing immediate rash, facial/tongue/throat swelling, SOB or lightheadedness with hypotension:YES Has patient had a PCN reaction causing severe rash involving mucus membranes or skin necrosis: NO Has patient had a PCN reaction that required hospitalization NO Has patient had a PCN reaction occurring within the last 10 years:NO If all of the above answers are "NO", then may proceed with Cephalosporin use.    Family History  Problem Relation Age of Onset  . Alzheimer's disease Father 61  . Cancer Father 2       metastatic prostate cancer  . Diabetes Sister 59  . Diabetes Brother   . Diabetes Brother   . Diabetes Brother   . Hypotension Neg Hx   . Malignant hyperthermia Neg Hx   . Pseudochol deficiency Neg Hx     Prior to Admission medications   Medication Sig Start Date End Date Taking? Authorizing Provider  albuterol (PROVENTIL  HFA;VENTOLIN HFA) 108 (90 Base) MCG/ACT inhaler Inhale 2 puffs into the lungs every 4 (four) hours as needed for wheezing or shortness of breath. 06/10/15   Joy, Shawn C, PA-C  albuterol (PROVENTIL) (2.5 MG/3ML) 0.083% nebulizer solution Take 2.5 mg by nebulization every 6 (six) hours as needed for wheezing or shortness of breath.    [provider]  allopurinol (ZYLOPRIM) 300 MG tablet Take 300 mg by mouth daily.      [provider]  CALCIUM-VITAMIN D PO Take 1 tablet by mouth 2 (two) times daily.     [provider]  ELIQUIS 2.5 MG TABS tablet Take 1 tablet by mouth 2 (two) times daily. 04/06/16   [provider]  feeding supplement, ENSURE ENLIVE, (ENSURE ENLIVE) LIQD Take 237 mLs by mouth 2 (two) times daily between meals.  07/26/16   Geradine Girt, DO  furosemide (LASIX) 40 MG tablet Take 1 tablet (40 mg total) by mouth 2 (two) times daily. 07/26/16   Geradine Girt, DO  hydrocortisone cream 1 % Apply 1 application topically 2 (two) times daily as needed for itching. 07/26/16   Geradine Girt, DO  isosorbide mononitrate (IMDUR) 30 MG 24 hr tablet Take 30 mg by mouth daily. 04/04/16   [provider]  metoprolol succinate (TOPROL-XL) 25 MG 24 hr tablet Take 25 mg by mouth daily. 04/04/16   [provider]  nitroGLYCERIN (NITROSTAT) 0.4 MG SL tablet Place 0.4 mg under the tongue every 5 (five) minutes as needed for chest pain.    [provider]  Omega-3 Fatty Acids (FISH OIL) 1000 MG CAPS Take 1,000 mg by mouth at bedtime.     [provider]  omeprazole (PRILOSEC) 40 MG capsule Take 1 capsule (40 mg total) by mouth 2 (two) times daily. 01/20/16   Orson Eva, MD  ondansetron (ZOFRAN ODT) 4 MG disintegrating tablet Take 1 tablet (4 mg total) by mouth every 8 (eight) hours as needed for nausea or vomiting. 07/07/16   Everlene Balls, MD  OXYGEN Inhale 2.5 L into the lungs continuous.    [provider]  potassium chloride SA  (K-DUR,KLOR-CON) 10 MEQ tablet Take 1 tablet (10 mEq total) by mouth daily. 04/25/16   Robbie Lis, MD  vitamin B-12 (CYANOCOBALAMIN) 1000 MCG tablet Take 1,000 mcg by mouth 2 (two) times daily.    [provider]    Physical Exam: Vitals:   08/03/16 1105 08/03/16 1240  BP: 108/78 (!) 155/73  Pulse: 63 61  Resp: 18 (!) 21  Temp: 97.5 F (36.4 C)   TempSrc: Oral   SpO2: 98% 100%     General:  Appears calm and comfortable, slightly pale chronically ill appearing no acute distress, cantankerous Eyes:  PERRL, EOMI, normal lids, iris ENT:  grossly normal hearing, lips & tongue, mucous membranes of his mouth are pink and moist. Neck:  no LAD, masses or thyromegaly Cardiovascular:  RRR, no m/r/g. 2-3+ LE edema bilaterally Respiratory:  No increased work of breathing with conversation. Breath sounds somewhat distant. Fine crackles bilateral bases. I hear no wheeze Abdomen:  soft, ntnd, positive bowel sounds throughout no guarding or rebounding Skin:  no rash or induration seen on limited exam Musculoskeletal:  grossly normal tone BUE/BLE, good ROM, no bony abnormality Psychiatric:  grossly normal mood and affect, speech fluent and appropriate, AOx3 Neurologic:  CN 2-12 grossly intact, moves all extremities in coordinated fashion, sensation intact, speech clear facial symmetry  Labs on Admission: I have personally reviewed following labs and imaging studies  CBC:  Recent Labs Lab 08/03/16 1107  WBC 4.0  HGB 8.8*  HCT 27.9*  MCV 96.9  PLT 427*   Basic Metabolic Panel:  Recent Labs Lab 08/03/16 1107  NA 131*  K 4.6  CL 95*  CO2 22  GLUCOSE 95  BUN 38*  CREATININE 1.33*  CALCIUM 9.1   GFR: Estimated Creatinine Clearance: 31.3 mL/min (A) (by C-G formula based on SCr of 1.33 mg/dL (H)). Liver Function Tests: No results for input(s): AST, ALT, ALKPHOS, BILITOT, PROT, ALBUMIN in the last 168 hours. No results for input(s): LIPASE, AMYLASE in the last 168  hours. No results for input(s): AMMONIA in the last 168 hours. Coagulation Profile: No results for input(s): INR, PROTIME in the last 168 hours. Cardiac Enzymes: No results for input(s):  CKTOTAL, CKMB, CKMBINDEX, TROPONINI in the last 168 hours. BNP (last 3 results) No results for input(s): PROBNP in the last 8760 hours. HbA1C: No results for input(s): HGBA1C in the last 72 hours. CBG: No results for input(s): GLUCAP in the last 168 hours. Lipid Profile: No results for input(s): CHOL, HDL, LDLCALC, TRIG, CHOLHDL, LDLDIRECT in the last 72 hours. Thyroid Function Tests: No results for input(s): TSH, T4TOTAL, FREET4, T3FREE, THYROIDAB in the last 72 hours. Anemia Panel: No results for input(s): VITAMINB12, FOLATE, FERRITIN, TIBC, IRON, RETICCTPCT in the last 72 hours. Urine analysis:    Component Value Date/Time   COLORURINE YELLOW 07/24/2016 2218   APPEARANCEUR CLEAR 07/24/2016 2218   LABSPEC 1.010 07/24/2016 2218   LABSPEC 1.015 05/03/2011 0950   PHURINE 5.0 07/24/2016 2218   GLUCOSEU NEGATIVE 07/24/2016 2218   HGBUR LARGE (A) 07/24/2016 2218   BILIRUBINUR NEGATIVE 07/24/2016 2218   BILIRUBINUR Color Interference 05/03/2011 0950   KETONESUR NEGATIVE 07/24/2016 2218   PROTEINUR NEGATIVE 07/24/2016 2218   UROBILINOGEN 0.2 11/10/2014 1815   NITRITE NEGATIVE 07/24/2016 2218   LEUKOCYTESUR NEGATIVE 07/24/2016 2218   LEUKOCYTESUR Color Interference 05/03/2011 0950    Creatinine Clearance: Estimated Creatinine Clearance: 31.3 mL/min (A) (by C-G formula based on SCr of 1.33 mg/dL (H)).  Sepsis Labs: @LABRCNTIP (procalcitonin:4,lacticidven:4) )No results found for this or any previous visit (from the past 240 hour(s)).   Radiological Exams on Admission: Dg Chest 2 View  Result Date: 08/03/2016 CLINICAL DATA:  83 year old with bilateral lower extremity edema and shortness of breath which began 3 days ago. Current history of Parissa Chiao lung disease. Personal history of prostate cancer  and lung cancer. Oxygen dependent COPD. EXAM: CHEST  2 VIEW COMPARISON:  07/24/2016, 07/06/2016 and earlier, including CT chest 08/07/2013. FINDINGS: Sternotomy for CABG. Left subclavian biventricular pacing defibrillator unchanged and appears intact. Residual right subclavian ventricular lead unchanged. Cardiac silhouette moderately to markedly enlarged, unchanged. Thoracic aorta atherosclerotic, unchanged. Hilar and mediastinal contours otherwise unremarkable. Mild diffuse interstitial pulmonary edema and small bilateral pleural effusions, increased since the examination 10 days ago. No confluent airspace consolidation. Degenerative changes involving the thoracic and upper lumbar spine. IMPRESSION: Mild CHF and small bilateral pleural effusions, worse than on the examination 10 days ago. Electronically Signed   By: Evangeline Dakin M.D.   On: 08/03/2016 12:19    EKG: Independently reviewed. Ventricular-paced rhythm with occasional Premature ventricular complexes Abnormal ECG since last tracing no significant change  Assessment/Plan Principal Problem:   Acute on chronic respiratory failure (HCC) Active Problems:   Chronic a-fib (HCC)   Pacemaker   Alphonsine Minium lung disease (HCC)   Anemia   Acute on chronic combined systolic and diastolic CHF (congestive heart failure) (HCC)   Chronic renal disease, stage III   DM2 (diabetes mellitus, type 2) (HCC)   COPD exacerbation ([place])   #1. Acute on chronic respiratory failure likely related to acute on chronic combined systolic and diastolic heart failure in the setting of COPD. Patient was discharged days ago for same. He reports compliance with his medications. Echo in November 2017 an EF of 15% diffus hypokinesis. BNP 2772. Initial troponin negative. EKG as noted above. Chest x-ray with mild CHF and small bilateral pleural effusions worse than on exam 10 days ago. He was provided Lasix 80 mg in the emergency department. Has voided almost 500 mL at the time of  admission -Admit to telemetry -Lasix 60 mg IV twice a day -Daily weights -Intake and output -Continue home meds -Wean oxygen to baseline  is able -Scheduled nebulizers  #2. Acute on chronic combined CHF. See #1. Chart review indicates at discharge 5 days ago his home Lasix was increased to 40 mg twice a day. 7 clear if he was taken his Lasix this no change. He does not weigh himself daily -Home meds include Lasix, imdur, metoprolol -Continue home meds -See #1  #3. Chronic A. fib on Eliquis and metoprolol. Rate controlled.Mali vasc score 5.  -Monitor on telemetry -Continue home meds  #4. COPD/Kenedy Haisley lung disease/chronic respiratory failure. He is on home oxygen at 2.5 L 24 7. Home medications include albuterol inhaler and nebulizer -Continue oxygen supplementation -Oxygen to baseline as able -Scheduled nebulizers -See #1  #5. Chronic kidney disease stage III. Creatinine on admission 1.33. This appears to be close to baseline. -Monitor urine output -Hold nephrotoxins as able  6. Anemia. Chronic. Likely related to chronic illness. Hemoglobin 8.8 on admission. Chart review indicates this is close to his baseline. No signs symptoms of active bleeding. He is on elequis -monitor -fobt -OP follow up   DVT prophylaxis: eliquis  Code Status: dnr  Family Communication: none present  Disposition Plan: home  Consults called: none  Admission status: inpatient    Radene Gunning MD [facility]  If 7PM-7AM, please contact night-coverage [email or web address] Password [email or web address]  08/03/2016, 1:49 PM

## 2016-08-03 NOTE — Consult Note (Signed)
Consultation Note Date: 08/03/2016   Patient Name: [name]  DOB: 02/03/1933  MRN: [id number]  Age / Sex: 83 y.o., male  PCP: Pearson Grippe, MD Referring Physician: Haydee Salter, MD  Reason for Consultation: Establishing goals of care  HPI/Patient Profile: 83 y.o. male  with past medical history of sCHF with EF 10-15%, ischemic cardiomyopathy with pacemaker placement, HTN, CAD s/p CABG, A. Fib, severe lung disease due to COPD and black lung disease, on home O2 2.5L, and DM2. This is his fourth admission since January, with his most recent stay 5/15-5/17 as a GIP admission for CHF. He had hospice through [facility] initiated in February at the recommendation of his Cardiologist, Dr. Antoine Poche. On 5/24 he formally stopped Hospice services, citing a desire for further treatment options. He then presented to the ED and was admitted on 08/03/2016 with CHF exacerbation. Palliative consulted to assist in clarifying goals of care.   Clinical Assessment and Goals of Care: I met with Mr. Gwin at his bedside, then spoke with his daughter [name] over the phone. This family is familiar to me, as I worked with them in January on an earlier admission. Mr. Westbrook relates that Hospice was initiated in February. He was told they would support him in staying at home and helping to control the symptoms associated with his heart failure. Unfortunately, he and his daughter relate ongoing frustration in their frequent medication adjustment, hesitancy to support hospitalization, and the requirement of notifying the Hospice group of doctor's appointments.   Both Dawn and Mr. Willig have an excellent understanding of his multiple health issues and the resultant expected progressive decline. They understand the incurable nature of his issues, and recognize he may have periods of improvement despite an overall trajectory of decline. That said, their goal is to continue aggressive management and  utilize the hospital as needed. Their goal of ongoing aggressive management and recurrent hospitalizations as needed is not aligned with the Hospice approach to care. Given his positive experience with [facility] previously, I recommended resuming their services. Mr. Verbrugge and [name] both agreed with that plan and were appreciative to have their support again.   Primary Decision Maker PATIENT   SUMMARY OF RECOMMENDATIONS    Full scope care up to DNR  I spoke with [facility] Liaison to notify her of family's desire to resume their services. Pt's desire for ongoing aggressive care and recurrent hospitalizations as needed do not align with Hospice.   **Pt and daughter with clear goals of care, and [facility] support at discharge will best meet their needs. Palliative to follow peripherally at this point. Please contact our team at [phone] for questions and concerns.  Code Status/Advance Care Planning:  DNR  Additional Recommendations (Limitations, Scope, Preferences):  Full Scope Treatment  Psycho-social/Spiritual:   Desire for further Chaplaincy support:no  Additional Recommendations: Education on [facility]  Prognosis:   < 6 months  Discharge Planning: Home with [facility] support      Primary Diagnoses: Present on Admission: . Acute on chronic combined systolic and diastolic CHF (congestive heart failure) (HCC) . Acute on chronic respiratory failure (HCC) . Anemia . Black lung disease (HCC) . Chronic renal disease, stage III . COPD exacerbation (HCC) . Pacemaker . Chronic a-fib (HCC)   I have reviewed the medical record, interviewed the patient and family, and examined the patient. The following aspects are pertinent.  Past Medical History:  Diagnosis Date  . Arthritis   . Black lung disease (HCC)   . CAD (coronary  artery disease)    a. 07/2002 CABG x 3: LIMA->LAD, VG->Diag, VG->OM;  b. 06/2006 Cath: LM 50-60ost/p, LAD patent mid stent, D1 sev dzs, D2 patent stent, LCX nl, OM2 sev sten prox, RCA  large/nl, VG->Diag nl, VG->OM nl, LIMA->LAD atretic, EF 30%.  . Carotid artery occlusion   . Chronic atrial fibrillation ([place])   . Chronic respiratory failure ([place])   . Chronic systolic CHF (congestive heart failure) ([place])    a. 12/2012 Echo: EF 20-25%, mid-dist antsept AK, mod dil LA.  [name] COPD (chronic obstructive pulmonary disease) ([place])    a. On home O2.  [name] CVA (cerebral vascular accident) ([place])   . Diabetes mellitus   . GERD (gastroesophageal reflux disease)   . Gout   . Hypercholesterolemia   . Hypertension   . Ischemic cardiomyopathy    a. 06/2006  BIV-ICD -> gen change 2015 to [place].  . Lung cancer ([place])   . Nephrolithiasis 09/2000  . On home oxygen therapy    "2.5L; 24/7" (07/25/2016)  . Prostate cancer ([place]) 11/01/10   gleason 7, 8, 9, gold seeds 02/08/11  . Symptomatic bradycardia    Social History   Social History  . Marital status: Widowed    Spouse name: N/A  . Number of children: N/A  . Years of education: N/A   Occupational History  . retired Retired    Equities trader   Social History Main Topics  . Smoking status: Former Smoker    Types: Cigarettes    Quit date: 03/12/1949  . Smokeless tobacco: Current User    Types: Chew  . Alcohol use No     Comment: drank until per pt 03/19/11, h/o heavy use  . Drug use: No  . Sexual activity: Yes   Other Topics Concern  . None   Social History Narrative  . None   Family History  Problem Relation Age of Onset  . Alzheimer's disease Father 50  . Cancer Father 69       metastatic prostate cancer  . Diabetes Sister 46  . Diabetes Brother   . Diabetes Brother   . Diabetes Brother   . Hypotension Neg Hx   . Malignant hyperthermia Neg Hx   . Pseudochol deficiency Neg Hx    Scheduled Meds: . albuterol  2.5 mg Nebulization Q6H  . allopurinol  300 mg Oral Daily  . apixaban  2.5 mg Oral BID  . furosemide  60 mg Intravenous BID  . isosorbide mononitrate  30 mg Oral Daily  . metoprolol succinate  25 mg Oral  Daily  . pantoprazole  40 mg Oral Daily  . potassium chloride  10 mEq Oral Daily  . sodium chloride flush  3 mL Intravenous Q12H  . vitamin B-12  1,000 mcg Oral BID   Continuous Infusions: . sodium chloride     PRN Meds:.sodium chloride, acetaminophen, albuterol, ALPRAZolam, nitroGLYCERIN, ondansetron (ZOFRAN) IV, ondansetron, sodium chloride flush Allergies  Allergen Reactions  . Spring [Digoxin] Other (See Comments)    Caused patient to have chest pains and dizzy spells  . Aspirin Hives  . Penicillins Rash    Rash Has patient had a PCN reaction causing immediate rash, facial/tongue/throat swelling, SOB or lightheadedness with hypotension:YES Has patient had a PCN reaction causing severe rash involving mucus membranes or skin necrosis: NO Has patient had a PCN reaction that required hospitalization NO Has patient had a PCN reaction occurring within the last 10 years:NO If all of the above answers are "NO",  then may proceed with Cephalosporin use.   Review of Systems  Constitutional: Positive for activity change and fatigue. Negative for appetite change. Unexpected weight change: Unsure, doesn't check weights at home.  HENT: Negative for congestion, facial swelling, sore throat and trouble swallowing.   Respiratory: Positive for cough, shortness of breath and wheezing. Negative for chest tightness.   Cardiovascular: Positive for leg swelling. Negative for chest pain.  Gastrointestinal: Positive for abdominal distention ("filling up with water"). Negative for abdominal pain, constipation, diarrhea, nausea and vomiting.  Genitourinary: Negative for difficulty urinating.  Musculoskeletal: Positive for gait problem (Fall two weeks ago). Negative for back pain.  Skin: Positive for pallor.  Neurological: Positive for weakness. Negative for dizziness and speech difficulty.  Psychiatric/Behavioral: Negative for confusion and sleep disturbance. The patient is not nervous/anxious.     Physical Exam  Constitutional: He is oriented to person, place, and time. He appears well-developed and well-nourished. No distress.  HENT:  Head: Normocephalic and atraumatic.  Mouth/Throat: Oropharynx is clear and moist. Abnormal dentition. No oropharyngeal exudate.  Eyes: EOM are normal.  Neck: Normal range of motion. Neck supple.  Cardiovascular: Normal rate and regular rhythm.   Pulmonary/Chest: Tachypnea noted. He has decreased breath sounds in the right lower field and the left lower field. He has no wheezes.  No dyspnea noted with prolonged conversation. Crackles in bilateral bases.  Abdominal: Soft. Bowel sounds are normal. He exhibits no distension.  Musculoskeletal: Normal range of motion. He exhibits edema (2+ BLE).  Neurological: He is alert and oriented to person, place, and time.  Skin: Skin is warm and dry. There is pallor.  Psychiatric: He has a normal mood and affect. His behavior is normal. Thought content normal. His speech is slurred. Cognition and memory are normal. He expresses impulsivity.    Vital Signs: BP (!) 155/73   Pulse 61   Temp 97.5 F (36.4 C) (Oral)   Resp (!) 21   SpO2 100%  Pain Assessment: No/denies pain   Pain Score: 0-No pain   SpO2: SpO2: 100 % O2 Device:SpO2: 100 % O2 Flow Rate: .O2 Flow Rate (L/min): 2.5 L/min  IO: Intake/output summary:  Intake/Output Summary (Last 24 hours) at 08/03/16 1358 Last data filed at 08/03/16 1338  Gross per 24 hour  Intake                0 ml  Output              425 ml  Net             -425 ml    LBM:   Baseline Weight:   Most recent weight:       Palliative Assessment/Data: PPS 60%    Time Total: 50 minutes Greater than 50%  of this time was spent counseling and coordinating care related to the above assessment and plan.  Signed by: Charlynn Court, NP Palliative Medicine Team Pager # [phone] (M-F 7a-5p) Team Phone # [phone] (Nights/Weekends)

## 2016-08-03 NOTE — Care Management Note (Addendum)
Case Management Note  Patient Details  Name: [name] MRN: [id number] Date of Birth: Jan 26, 1933  Subjective/Objective:            Admitted with CHF exacerbation,  Hx of CHF with EF 10-15%, ischemic cardiomyopathy with pacemaker placement, HTN, CAD s/p CABG, A. Fib, severe lung disease due to COPD and black lung disease, on home O2 2.5L, and DM2. Pt resides with daughters. PTA  pt received home hosp[facility] care from [facility] which ended on 08/02/2016.        [address] (Daughter) Shelbie Ammons (Daughter)    [phone]      PCP: [name]  Action/Plan: Discharge planning in process....PT, OT evaluations pending..... CM to f/u with disposition needs.   Expected Discharge Date:                  Expected Discharge Plan:     In-House Referral:     Discharge planning Services     Post Acute Care Choice:    Choice offered to:     DME Arranged:   oxygen 2.5l/min) DME Agency:   Pt active with [place]:    [place] Agency:   [place], selected by daughter if home health services are needed @ d/c.  Status of Service:     If discussed at Long Length of Stay Meetings, dates discussed:    Additional Comments:  CM received consult :Heart failure home health screen and may place order for PT/OT eval and treat if indicated.    Whitman Hero [place], RN 08/03/2016, 4:11 PM

## 2016-08-03 NOTE — Consult Note (Addendum)
   [facility] CM Inpatient Consult   08/03/2016  [name] 1932-12-20 [id number]   Referral received from [name] Court, NP from [place] for [facility] to restart [facility] services for community care management.  She states that the patient and family officially stopped Hospice services on 08/02/16 due to the patient and the family desires aggressive care.  Met with the patient at the bedside and he states he feels that they [Hospice] were too much in his business. States, "I am old enough to be their daddy and they telling me what to do." Patient is active with Dr. [name].  Inpatient spoke with daughter via phone, [name], as their desires ongoing for post hospital care.  Daughter states she would like home health to follow up. They would like to have home health care along with [facility]. A sign consent in on file. Inpatient RNCM aware of [facility] referrral from Palliative Care for restart of [facility] follow up and home health needs.  Will continue to follow.  For questions, please contact:    Natividad Brood, RN BSN [facility] Liaison  [phone] business mobile phone Toll free office [phone]   Addendum:   Per call with [name] states, "we are going to do whatever to keep daddy home.  My mom died at home and that's what we expect for him. He does not like a like of people telling him what to do at time."

## 2016-08-03 NOTE — ED Provider Notes (Signed)
[facility] DEPT Provider Note   CSN: [id number] Arrival date & time: 08/03/16  1049     History   Chief Complaint Chief Complaint  Patient presents with  . Shortness of Breath    HPI [name] is a 83 y.o. male.  Patient is a 83 year old male with a history of cardiomyopathy with an EF of 15%, COPD and coronary artery disease who presents with shortness of breath. He is on home oxygen at 2.5 L/m. He had a recent admission for CHF exacerbation. He states over the last week he's had worsening swelling which includes swelling in his legs and abdominal swelling. He states this is typical for him when he is holding onto too much fluid. He does not know his baseline weight is. He is requiring more oxygen than his baseline. He denies any chest pain. He denies any known fevers. He has a nonproductive cough. He states that his PCP recently told him to double up on his Lasix which she's been doing without improvement in symptoms. It's unclear whether he's actively in hospice care. He was in hospice on his last discharge from the hospital. However he states that he sent them away because he didn't like that they weren't allowing him to see his own doctors.      Past Medical History:  Diagnosis Date  . Arthritis   . 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 CAD (coronary artery disease)    a. 07/2002 CABG x 3: LIMA->LAD, VG->Diag, VG->OM;  b. 06/2006 Cath: LM 50-60ost/p, LAD patent mid stent, D1 sev dzs, D2 patent stent, LCX nl, OM2 sev sten prox, RCA large/nl, VG->Diag nl, VG->OM nl, LIMA->LAD atretic, EF 30%.  . Carotid artery occlusion   . Chronic atrial fibrillation ([place])   . Chronic respiratory failure ([place])   . Chronic systolic CHF (congestive heart failure) ([place])    a. 12/2012 Echo: EF 20-25%, mid-dist antsept AK, mod dil LA.  [name] COPD (chronic obstructive pulmonary disease) ([place])    a. On home O2.  [name] CVA (cerebral vascular accident) ([place])   . Diabetes mellitus   . GERD (gastroesophageal  reflux disease)   . Gout   . Hypercholesterolemia   . Hypertension   . Ischemic cardiomyopathy    a. 06/2006  BIV-ICD -> gen change 2015 to [place].  . Lung cancer ([place])   . Nephrolithiasis 09/2000  . On home oxygen therapy    "2.5L; 24/7" (07/25/2016)  . Prostate cancer ([place]) 11/01/10   gleason 7, 8, 9, gold seeds 02/08/11  . Symptomatic bradycardia     Patient Active Problem List   Diagnosis Date Noted  . Acute on chronic systolic CHF (congestive heart failure) ([place]) 07/24/2016  . Hyperkalemia 04/24/2016  . Hypotension 04/24/2016  . Goals of care, counseling/discussion   . CHF exacerbation ([place]) 04/02/2016  . Acute on chronic respiratory failure ([place]) 04/02/2016  . COPD exacerbation ([place]) 04/02/2016  . Macrocytic anemia 04/02/2016  . Arterial thrombosis ([place]) 01/20/2016  . DM2 (diabetes mellitus, type 2) ([place]) 01/20/2016  . Critical lower limb ischemia 01/20/2016  . Ischemic leg 01/20/2016  . Chronic renal disease, stage III 01/19/2016  . Chronic anticoagulation-Xarelto 01/19/2016  . Cardiomyopathy, ischemic 01/19/2016  . Acute on chronic combined systolic and diastolic CHF (congestive heart failure) ([place]) 01/18/2016  . Anemia 01/16/2016  . Black lung disease ([place]) 10/27/2015  . Pain in the chest   . Hx of CABG x 3 2004 after multiple PCI's in'90s   . Chronic respiratory  failure 08/08/2013  . CAP (community acquired pneumonia) 08/07/2013  . Pneumonia 08/07/2013  . Occlusion and stenosis of carotid artery without mention of cerebral infarction 05/26/2013  . Syncope 02/15/2013  . Weakness generalized 05/14/2011  . On home oxygen therapy   . Prostate cancer ([place])   . Pacemaker   . Hematochezia 02/19/2011  . Melena 02/19/2011  . Prostate cancer, recur risk not determined whether low, med or high ([place])   . Systolic CHF, chronic ([place]) 02/16/2011  . SBO (small bowel obstruction) ([place]) 02/15/2011  . ARF (acute renal failure) ([place]) 02/15/2011  . UTI (lower urinary  tract infection) 02/15/2011  . Syncope and collapse 02/10/2011  . S/P [employer] Jude BiV ICD upgrade 2015 05/24/2009  . Cardiac conduction disorder 06/01/2008  . Chronic a-fib ([place]) 06/01/2008    Past Surgical History:  Procedure Laterality Date  . BI-VENTRICULAR PACEMAKER INSERTION (CRT-P)  12-02-13   downgrade of previously implanted CRTD to STJ CRTP  . BIV PACEMAKER GENERATOR CHANGE OUT N/A 12/02/2013   Procedure: BIV PACEMAKER GENERATOR CHANGE OUT;  Surgeon: Evans Lance, MD;  Location: [facility] CATH LAB;  Service: Cardiovascular;  Laterality: N/A;  . CATARACT EXTRACTION W/ INTRAOCULAR LENS  IMPLANT, BILATERAL Bilateral   . COLONOSCOPY    . CORONARY ANGIOPLASTY WITH STENT PLACEMENT  07/1997; 08/1997;03/1998  . CORONARY ARTERY BYPASS GRAFT  07/2002   CABG X3  . ESOPHAGOGASTRODUODENOSCOPY  02/19/2011   Procedure: ESOPHAGOGASTRODUODENOSCOPY (EGD);  Surgeon: Jeryl [place], MD;  Location: [facility] ENDOSCOPY;  Service: Endoscopy;  Laterality: N/A;  . ESOPHAGOGASTRODUODENOSCOPY    . FEMORAL-POPLITEAL BYPASS GRAFT Right 01/20/2016   Procedure: THROMBECTOMY OF POPLITEAL ARTERY AND ANTERIOR TIBIAL ARTERY RIGHT LEG; INTRAOPERATIVE ARTERIOGRAM;  Surgeon: Waynetta Sandy, MD;  Location: [facility];  Service: Vascular;  Laterality: Right;  . INCISION AND DRAINAGE OF WOUND  08/2002   right thigh; S/P EVH  . INSERT / REPLACE / [place]; 1992; 01/2000;  . INSERT / REPLACE / REMOVE PACEMAKER  09/2003; 06/2006   w/AICD  . INSERT / REPLACE / REMOVE PACEMAKER  12/2004   pacmaker explant  . SHOULDER ARTHROSCOPY W/ ROTATOR CUFF REPAIR  05/2008   left       Home Medications    Prior to Admission medications   Medication Sig Start Date End Date Taking? Authorizing Provider  albuterol (PROVENTIL HFA;VENTOLIN HFA) 108 (90 Base) MCG/ACT inhaler Inhale 2 puffs into the lungs every 4 (four) hours as needed for wheezing or shortness of breath. 06/10/15   Joy, Shawn C, PA-C  albuterol (PROVENTIL) (2.5 MG/3ML)  0.083% nebulizer solution Take 2.5 mg by nebulization every 6 (six) hours as needed for wheezing or shortness of breath.    [provider]  allopurinol (ZYLOPRIM) 300 MG tablet Take 300 mg by mouth daily.      [provider]  CALCIUM-VITAMIN D PO Take 1 tablet by mouth 2 (two) times daily.     [provider]  ELIQUIS 2.5 MG TABS tablet Take 1 tablet by mouth 2 (two) times daily. 04/06/16   [provider]  feeding supplement, ENSURE ENLIVE, (ENSURE ENLIVE) LIQD Take 237 mLs by mouth 2 (two) times daily between meals. 07/26/16   Geradine Girt, DO  furosemide (LASIX) 40 MG tablet Take 1 tablet (40 mg total) by mouth 2 (two) times daily. 07/26/16   Geradine Girt, DO  hydrocortisone cream 1 % Apply 1 application topically 2 (two) times daily as needed for itching. 07/26/16   Eliseo Squires,  Jessica U, DO  isosorbide mononitrate (IMDUR) 30 MG 24 hr tablet Take 30 mg by mouth daily. 04/04/16   [provider]  metoprolol succinate (TOPROL-XL) 25 MG 24 hr tablet Take 25 mg by mouth daily. 04/04/16   [provider]  nitroGLYCERIN (NITROSTAT) 0.4 MG SL tablet Place 0.4 mg under the tongue every 5 (five) minutes as needed for chest pain.    [provider]  Omega-3 Fatty Acids (FISH OIL) 1000 MG CAPS Take 1,000 mg by mouth at bedtime.     [provider]  omeprazole (PRILOSEC) 40 MG capsule Take 1 capsule (40 mg total) by mouth 2 (two) times daily. 01/20/16   Orson Eva, MD  ondansetron (ZOFRAN ODT) 4 MG disintegrating tablet Take 1 tablet (4 mg total) by mouth every 8 (eight) hours as needed for nausea or vomiting. 07/07/16   Everlene Balls, MD  OXYGEN Inhale 2.5 L into the lungs continuous.    [provider]  potassium chloride SA (K-DUR,KLOR-CON) 10 MEQ tablet Take 1 tablet (10 mEq total) by mouth daily. 04/25/16   Robbie Lis, MD  vitamin B-12 (CYANOCOBALAMIN) 1000 MCG tablet Take 1,000 mcg by mouth 2 (two) times daily.    [provider]    Family History Family History  Problem Relation Age of Onset  . Alzheimer's disease Father 47  . Cancer Father 43       metastatic prostate cancer  . Diabetes Sister 55  . Diabetes Brother   . Diabetes Brother   . Diabetes Brother   . Hypotension Neg Hx   . Malignant hyperthermia Neg Hx   . Pseudochol deficiency Neg Hx     Social History Social History  Substance Use Topics  . Smoking status: Former Smoker    Types: Cigarettes    Quit date: 03/12/1949  . Smokeless tobacco: Current User    Types: Chew  . Alcohol use No     Comment: drank until per pt 03/19/11, h/o heavy use     Allergies   Digitek [digoxin]; Aspirin; and Penicillins   Review of Systems Review of Systems  Constitutional: Positive for fatigue. Negative for chills, diaphoresis and fever.  HENT: Negative for congestion, rhinorrhea and sneezing.   Eyes: Negative.   Respiratory: Positive for cough and shortness of breath. Negative for chest tightness.   Cardiovascular: Positive for leg swelling. Negative for chest pain.  Gastrointestinal: Negative for abdominal pain, blood in stool, diarrhea, nausea and vomiting.  Genitourinary: Negative for difficulty urinating, flank pain, frequency and hematuria.  Musculoskeletal: Negative for arthralgias and back pain.  Skin: Negative for rash.  Neurological: Negative for dizziness, speech difficulty, weakness, numbness and headaches.     Physical Exam Updated Vital Signs BP (!) 155/73   Pulse 61   Temp 97.5 F (36.4 C) (Oral)   Resp (!) 21   SpO2 100%   Physical Exam  Constitutional: He is oriented to person, place, and time. He appears well-developed and well-nourished.  HENT:  Head: Normocephalic and atraumatic.  Eyes: Pupils are equal, round, and reactive to light.  Neck: Normal range of motion. Neck supple.  Cardiovascular: Normal rate, regular rhythm and normal heart sounds.   Pulmonary/Chest: Effort normal. No respiratory distress.  He has no wheezes. He has rales. He exhibits no tenderness.  Abdominal: Soft. Bowel sounds are normal. There is no tenderness. There is no rebound and no guarding.  Musculoskeletal: Normal range of motion. He exhibits edema (3+ pitting edema bilaterally).  Lymphadenopathy:  He has no cervical adenopathy.  Neurological: He is alert and oriented to person, place, and time.  Skin: Skin is warm and dry. No rash noted.  Psychiatric: He has a normal mood and affect.     ED Treatments / Results  Labs (all labs ordered are listed, but only abnormal results are displayed) Labs Reviewed  BASIC METABOLIC PANEL - Abnormal; Notable for the following:       Result Value   Sodium 131 (*)    Chloride 95 (*)    BUN 38 (*)    Creatinine, Ser 1.33 (*)    GFR calc non Af Amer 48 (*)    GFR calc Af Amer 56 (*)    All other components within normal limits  CBC - Abnormal; Notable for the following:    RBC 2.88 (*)    Hemoglobin 8.8 (*)    HCT 27.9 (*)    RDW 20.1 (*)    Platelets 127 (*)    All other components within normal limits  BRAIN NATRIURETIC PEPTIDE - Abnormal; Notable for the following:    B Natriuretic Peptide 2,772.1 (*)    All other components within normal limits  I-STAT TROPOININ, ED    EKG  EKG Interpretation  Date/Time:  Friday Aug 03 2016 10:59:59 EDT Ventricular Rate:  61 PR Interval:    QRS Duration: 180 QT Interval:  508 QTC Calculation: 511 R Axis:   138 Text Interpretation:  Ventricular-paced rhythm with occasional Premature ventricular complexes Abnormal ECG since last tracing no significant change Confirmed by Malvin Johns [phone]) on 08/03/2016 11:47:58 AM       Radiology Dg Chest 2 View  Result Date: 08/03/2016 CLINICAL DATA:  83 year old with bilateral lower extremity edema and shortness of breath which began 3 days ago. Current history of black lung disease. Personal history of prostate cancer and lung cancer. Oxygen dependent COPD. EXAM: CHEST  2 VIEW  COMPARISON:  07/24/2016, 07/06/2016 and earlier, including CT chest 08/07/2013. FINDINGS: Sternotomy for CABG. Left subclavian biventricular pacing defibrillator unchanged and appears intact. Residual right subclavian ventricular lead unchanged. Cardiac silhouette moderately to markedly enlarged, unchanged. Thoracic aorta atherosclerotic, unchanged. Hilar and mediastinal contours otherwise unremarkable. Mild diffuse interstitial pulmonary edema and small bilateral pleural effusions, increased since the examination 10 days ago. No confluent airspace consolidation. Degenerative changes involving the thoracic and upper lumbar spine. IMPRESSION: Mild CHF and small bilateral pleural effusions, worse than on the examination 10 days ago. Electronically Signed   By: Evangeline Dakin M.D.   On: 08/03/2016 12:19    Procedures Procedures (including critical care time)  Medications Ordered in ED Medications  furosemide (LASIX) injection 80 mg (80 mg Intravenous Given 08/03/16 1309)     Initial Impression / Assessment and Plan / ED Course  I have reviewed the triage vital signs and the nursing notes.  Pertinent labs & imaging results that were available during my care of the patient were reviewed by me and considered in my medical decision making (see chart for details).     Patient presents with worsening congestive heart failure/fluid overload. There is no evidence of pneumonia. He is then started on IV Lasix in the ED.  I spoke with the APP with the hospitalist service to admit the patient.  Final Clinical Impressions(s) / ED Diagnoses   Final diagnoses:  Acute systolic congestive heart failure ([place])    New Prescriptions New Prescriptions   No medications on file     Malvin Johns, MD 08/03/16 1314

## 2016-08-03 NOTE — Progress Notes (Signed)
CRITICAL VALUE ALERT  Critical Value:  0.03  Date & Time Notied:  08/03/16 at 2012  Provider Notified: T.opyd  Orders Received/Actions taken: not yet

## 2016-08-03 NOTE — ED Triage Notes (Signed)
Per family, pt has CHF and COPD, pacemarker. Pt c/o increased leg swelling and SOB x3 days. Pt is AAOX4 in NAD. Last week PCP told pt to double up on lasix and it still not working. Pt is still leg swelling and SOB. Resp e/u

## 2016-08-04 DIAGNOSIS — I5021 Acute systolic (congestive) heart failure: Secondary | ICD-10-CM

## 2016-08-04 LAB — BASIC METABOLIC PANEL
Anion gap: 11 (ref 5–15)
BUN: 39 mg/dL — AB (ref 6–20)
CHLORIDE: 96 mmol/L — AB (ref 101–111)
CO2: 26 mmol/L (ref 22–32)
Calcium: 8.9 mg/dL (ref 8.9–10.3)
Creatinine, Ser: 1.45 mg/dL — ABNORMAL HIGH (ref 0.61–1.24)
GFR calc Af Amer: 50 mL/min — ABNORMAL LOW (ref >60)
GFR calc non Af Amer: 43 mL/min — ABNORMAL LOW (ref >60)
GLUCOSE: 95 mg/dL (ref 65–99)
POTASSIUM: 3.9 mmol/L (ref 3.5–5.1)
Sodium: 133 mmol/L — ABNORMAL LOW (ref 135–145)

## 2016-08-04 NOTE — Progress Notes (Signed)
[facility] PROGRESS NOTE  [name] ASN:[id number] DOB: 02/21/1933 DOA: 08/03/2016   PCP: [name], MD     Assessment/Plan: Principal Problem:   Acute on chronic respiratory failure ([place]) Active Problems:   Chronic a-fib ([place])   Pacemaker   Black lung disease ([place])   Anemia   Acute on chronic combined systolic and diastolic CHF (congestive heart failure) ([facility])   Chronic renal disease, stage III   DM2 (diabetes mellitus, type 2) ([facility])   COPD exacerbation ([place])   Palliative care by specialist    83 y.o. male  with past medical history of sCHF with EF 10-15%, ischemic cardiomyopathy with pacemaker placement, HTN, CAD s/p CABG, A. Fib, severe lung disease due to COPD and black lung disease, on home O2 2.5L, and DM2. This is his fourth admission since January, with his most recent stay 5/15-5/17 as a GIP admission for CHF. He had hospice through [facility] initiated in February at the recommendation of his Cardiologist, Dr. Percival Spanish. On 5/24 he formally stopped Hospice services, citing a desire for further treatment options. He then presented to the ED and was admitted on 08/03/2016 with CHF exacerbation. Palliative consulted to assist in clarifying goals of care.   Assessment and plan #1. Acute on chronic respiratory failure likely related to acute on chronic combined systolic and diastolic heart failure in the setting of COPD. Patient was discharged days ago for same. He reports compliance with his medications. Echo in November 2017 an EF of 15% diffus hypokinesis. BNP 2772. Initial troponin negative. EKG as noted above. Chest x-ray with mild CHF and small bilateral pleural effusions worse than on exam 10 days ago. He was provided Lasix 80 mg in the emergency department. Has voided almost  2050 in the last 24 hours Continue telemetry- paced rhythm -Lasix 60 mg IV twice a day, renal function stable -Weight up by 9 pounds since discharge May need to consider cardiology  consultation for further recommendations as the patient would like to pursue full scope of treatment, second admission this month -Wean oxygen to baseline is able -Scheduled nebulizers  #2. Acute on chronic combined CHF. See #1. Chart review indicates at discharge 5 days ago his home Lasix was increased to 40 mg twice a day.  [name] He does not weigh himself daily -Home meds include Lasix, imdur, metoprolol -Continue home meds -See #1  #3. Chronic A. fib on Eliquis and metoprolol. Rate controlled.Mali vasc score 5.  -Monitor on telemetry -Continue home meds  #4. COPD/black lung disease/chronic respiratory failure. He is on home oxygen at 2.5 L 24/ 7. Home medications include albuterol inhaler and nebulizer -Continue oxygen supplementation -Oxygen to baseline as able -Scheduled nebulizers -See #1  #5. Chronic kidney disease stage III. Creatinine on admission 1.33. This appears to be close to baseline. -Monitor urine output -Hold nephrotoxins as able  6. Anemia. Chronic. Likely related to chronic illness. Hemoglobin 8.8 on admission. Chart review indicates this is close to his baseline. No signs symptoms of active bleeding. He is on elequis -monitor -fobt -OP follow up    DVT prophylaxsis eliquis  Code Status:  DO NOT RESUSCITATE    Family Communication: Discussed in detail with the patient, all imaging results, lab results explained to the patient   Disposition Plan:   1-2 days      Consultants:  Cardiology  Procedures:  None  Antibiotics: Anti-infectives    None         HPI/Subjective: Feels  better , less sob then yesterday  Objective: Vitals:   08/03/16 2151 08/04/16 0605 08/04/16 0605 08/04/16 0825  BP: 119/68  120/72   Pulse: 63  (!) 59   Resp: 18  18   Temp: 97.8 F (36.6 C)  98.8 F (37.1 C)   TempSrc:      SpO2: 100%  100% 100%  Weight:  64.5 kg (142 lb 3.2 oz)    Height:        Intake/Output Summary (Last 24 hours) at 08/04/16  0950 Last data filed at 08/04/16 0846  Gross per 24 hour  Intake              120 ml  Output             2250 ml  Net            -2130 ml    Exam:  Examination:  General exam: Appears calm and comfortable  Respiratory system: Clear to auscultation. Respiratory effort normal. Cardiovascular system: S1 & S2 heard, RRR. No JVD, murmurs, rubs, gallops or clicks. No pedal edema. Gastrointestinal system: Abdomen is nondistended, soft and nontender. No organomegaly or masses felt. Normal bowel sounds heard. Central nervous system: Alert and oriented. No focal neurological deficits. Extremities: Symmetric 5 x 5 power. Skin: No rashes, lesions or ulcers Psychiatry: Judgement and insight appear normal. Mood & affect appropriate.     Data Reviewed: I have personally reviewed following labs and imaging studies  Micro Results No results found for this or any previous visit (from the past 240 hour(s)).  Radiology Reports Dg Chest 2 View  Result Date: 08/03/2016 CLINICAL DATA:  83 year old with bilateral lower extremity edema and shortness of breath which began 3 days ago. Current history of black lung disease. Personal history of prostate cancer and lung cancer. Oxygen dependent COPD. EXAM: CHEST  2 VIEW COMPARISON:  07/24/2016, 07/06/2016 and earlier, including CT chest 08/07/2013. FINDINGS: Sternotomy for CABG. Left subclavian biventricular pacing defibrillator unchanged and appears intact. Residual right subclavian ventricular lead unchanged. Cardiac silhouette moderately to markedly enlarged, unchanged. Thoracic aorta atherosclerotic, unchanged. Hilar and mediastinal contours otherwise unremarkable. Mild diffuse interstitial pulmonary edema and small bilateral pleural effusions, increased since the examination 10 days ago. No confluent airspace consolidation. Degenerative changes involving the thoracic and upper lumbar spine. IMPRESSION: Mild CHF and small bilateral pleural effusions, worse than  on the examination 10 days ago. Electronically Signed   By: Evangeline Dakin M.D.   On: 08/03/2016 12:19   Dg Chest 2 View  Result Date: 07/24/2016 CLINICAL DATA:  Shortness of breath. EXAM: CHEST  2 VIEW COMPARISON:  July 06, 2016 FINDINGS: No pneumothorax. Cardiomegaly. Bilateral pulmonary opacities could represent developing infiltrate or edema. AICD device is identified. No other acute abnormalities are seen. IMPRESSION: Cardiomegaly. Bilateral pulmonary opacities. The pulmonary opacities are not specific based on imaging. History of shortness of breath with leg swelling and no cough suggests edema. Developing infiltrate not excluded on this study. Recommend clinical correlation and follow-up to resolution. Electronically Signed   By: Dorise Bullion III M.D   On: 07/24/2016 17:33   Dg Chest 2 View  Result Date: 07/06/2016 CLINICAL DATA:  83 year old male with right lower abdominal pain and nausea. Shortness of breath. EXAM: CHEST  2 VIEW COMPARISON:  Chest radiograph dated 04/02/2016 FINDINGS: There is stable moderate cardiomegaly. No vascular congestion or edema. Median sternotomy wires, CABG vascular clips, and left pectoral AICD device noted. Retained pacemaker wire in the right chest wall  noted. Small focal hazy density along the lateral aspect of the minor fissure likely corresponds to the scarring seen on the CT. There is chronic changes of the left lung base with areas of scarring and pleural thickening. No focal consolidation, pleural effusion, or pneumothorax. There is degenerative changes of the spine. No acute osseous pathology. IMPRESSION: 1. No acute cardiopulmonary process. 2. Stable moderate cardiomegaly.  No vascular congestion or edema. 3. Chronic changes and scarring predominantly at the left lung base. Electronically Signed   By: Anner Crete M.D.   On: 07/06/2016 21:56   Ct Abdomen Pelvis W Contrast  Result Date: 07/07/2016 CLINICAL DATA:  Right lower quadrant pain EXAM: CT  ABDOMEN AND PELVIS WITH CONTRAST TECHNIQUE: Multidetector CT imaging of the abdomen and pelvis was performed using the standard protocol following bolus administration of intravenous contrast. CONTRAST:  120mL ISOVUE-300 IOPAMIDOL (ISOVUE-300) INJECTION 61% COMPARISON:  01/17/2016 FINDINGS: Lower chest: Small right greater than left pleural effusions. Calcified pleural plaque on the left posteriorly. Stable cardiomegaly with right atrial, coronary sinus and right ventricular leads noted. Chronic rounded atelectasis at the left lung base posteriorly. Bibasilar dependent and subsegmental atelectasis. Hepatobiliary: Gallbladder is contracted. No gallstones are seen. No space-occupying mass of liver. No ductal dilatation. Pancreas: No pancreatic ductal dilatation or focal mass. Spleen: Normal Adrenals/Urinary Tract: Normal adrenal glands. Right lower pole nonobstructing 8.5 mm renal stone. Stable left lower pole 3 cm exophytic renal cyst. This exophytic cyst again demonstrates peripheral thin mural calcifications. Stomach/Bowel: The stomach is distended with contrast and fluid. There is no bowel obstruction. There is mild diffuse distention of small intestine approximately, nonspecific in etiology. Mild enteritis is not excluded. Circular muscle hypertrophy and sigmoid diverticulosis noted as before. Vascular/Lymphatic: Aortoiliac and branch vessel atherosclerosis. No aortic aneurysm or dissection. No lymphadenopathy. Reproductive: Radiation seeds noted of the normal size prostate. Other: No free air or free fluid. Musculoskeletal: Schmorl's node involving the superior endplate of L4. Degenerative disc disease L2-3 and L4-5. Multilevel osteophytes. IMPRESSION: Findings suggest mild enteritis. Sigmoid diverticulosis with circular muscle hypertrophy. Complex lower pole 3 cm exophytic cyst with thin peripheral calcifications suggesting a Bosniak category 2 renal cyst. Nonobstructing stable right lower pole 8.5 mm renal  calculus. Small bilateral pleural effusions. Left-sided calcified pleural plaque consistent with prior asbestos exposure. Electronically Signed   By: Ashley Royalty M.D.   On: 07/07/2016 02:36     CBC  Recent Labs Lab 08/03/16 1107  WBC 4.0  HGB 8.8*  HCT 27.9*  PLT 127*  MCV 96.9  MCH 30.6  MCHC 31.5  RDW 20.1*    Chemistries   Recent Labs Lab 08/03/16 1107 08/04/16 0337  NA 131* 133*  K 4.6 3.9  CL 95* 96*  CO2 22 26  GLUCOSE 95 95  BUN 38* 39*  CREATININE 1.33* 1.45*  CALCIUM 9.1 8.9   ------------------------------------------------------------------------------------------------------------------ estimated creatinine clearance is 31.6 mL/min (A) (by C-G formula based on SCr of 1.45 mg/dL (H)). ------------------------------------------------------------------------------------------------------------------ No results for input(s): HGBA1C in the last 72 hours. ------------------------------------------------------------------------------------------------------------------ No results for input(s): CHOL, HDL, LDLCALC, TRIG, CHOLHDL, LDLDIRECT in the last 72 hours. ------------------------------------------------------------------------------------------------------------------ No results for input(s): TSH, T4TOTAL, T3FREE, THYROIDAB in the last 72 hours.  Invalid input(s): FREET3 ------------------------------------------------------------------------------------------------------------------ No results for input(s): VITAMINB12, FOLATE, FERRITIN, TIBC, IRON, RETICCTPCT in the last 72 hours.  Coagulation profile No results for input(s): INR, PROTIME in the last 168 hours.  No results for input(s): DDIMER in the last 72 hours.  Cardiac Enzymes  Recent Labs Lab  08/03/16 1835  TROPONINI 0.03*   ------------------------------------------------------------------------------------------------------------------ Invalid input(s): POCBNP   CBG: No results for  input(s): GLUCAP in the last 168 hours.     Studies: Dg Chest 2 View  Result Date: 08/03/2016 CLINICAL DATA:  83 year old with bilateral lower extremity edema and shortness of breath which began 3 days ago. Current history of black lung disease. Personal history of prostate cancer and lung cancer. Oxygen dependent COPD. EXAM: CHEST  2 VIEW COMPARISON:  07/24/2016, 07/06/2016 and earlier, including CT chest 08/07/2013. FINDINGS: Sternotomy for CABG. Left subclavian biventricular pacing defibrillator unchanged and appears intact. Residual right subclavian ventricular lead unchanged. Cardiac silhouette moderately to markedly enlarged, unchanged. Thoracic aorta atherosclerotic, unchanged. Hilar and mediastinal contours otherwise unremarkable. Mild diffuse interstitial pulmonary edema and small bilateral pleural effusions, increased since the examination 10 days ago. No confluent airspace consolidation. Degenerative changes involving the thoracic and upper lumbar spine. IMPRESSION: Mild CHF and small bilateral pleural effusions, worse than on the examination 10 days ago. Electronically Signed   By: Evangeline Dakin M.D.   On: 08/03/2016 12:19      Lab Results  Component Value Date   HGBA1C 5.4 10/27/2015   HGBA1C 5.6 05/14/2011   HGBA1C 5.6 02/10/2011   Lab Results  Component Value Date   LDLCALC 92 10/27/2015   CREATININE 1.45 (H) 08/04/2016       Scheduled Meds: . albuterol  2.5 mg Nebulization TID  . allopurinol  300 mg Oral Daily  . apixaban  2.5 mg Oral BID  . furosemide  60 mg Intravenous BID  . isosorbide mononitrate  30 mg Oral Daily  . metoprolol succinate  25 mg Oral Daily  . pantoprazole  40 mg Oral Daily  . potassium chloride  10 mEq Oral Daily  . sodium chloride flush  3 mL Intravenous Q12H  . vitamin B-12  1,000 mcg Oral BID   Continuous Infusions: . sodium chloride       LOS: 1 day    Time spent: >30 MINS    Reyne Dumas  [facility] Pager [phone].  If 7PM-7AM, please contact night-coverage at [email or web address], password [facility] 08/04/2016, 9:50 AM  LOS: 1 day

## 2016-08-04 NOTE — Progress Notes (Signed)
PT Cancellation Note  Patient Details Name: [name] MRN: [id number] DOB: Jan 04, 1933   Cancelled Treatment:    Reason Eval/Treat Not Completed: Patient declined, no reason specified.  "not doing anything today." 08/04/2016  Donnella Sham, PT [phone]  (pager)   Tessie Fass Johney Perotti 08/04/2016, 1:35 PM

## 2016-08-04 NOTE — Progress Notes (Signed)
Patient has troponin 0.03 notified the MD and gave advice for monitoring the patient. Will continue monitor the patient and notify as needed.

## 2016-08-05 LAB — CBC
HEMATOCRIT: 24.7 % — AB (ref 39.0–52.0)
HEMOGLOBIN: 7.6 g/dL — AB (ref 13.0–17.0)
MCH: 30.3 pg (ref 26.0–34.0)
MCHC: 30.8 g/dL (ref 30.0–36.0)
MCV: 98.4 fL (ref 78.0–100.0)
Platelets: 116 10*3/uL — ABNORMAL LOW (ref 150–400)
RBC: 2.51 MIL/uL — AB (ref 4.22–5.81)
RDW: 20.4 % — ABNORMAL HIGH (ref 11.5–15.5)
WBC: 3.6 10*3/uL — ABNORMAL LOW (ref 4.0–10.5)

## 2016-08-05 LAB — COMPREHENSIVE METABOLIC PANEL
ALBUMIN: 2.7 g/dL — AB (ref 3.5–5.0)
ALK PHOS: 112 U/L (ref 38–126)
ALT: 27 U/L (ref 17–63)
ANION GAP: 10 (ref 5–15)
AST: 20 U/L (ref 15–41)
BILIRUBIN TOTAL: 1.2 mg/dL (ref 0.3–1.2)
BUN: 27 mg/dL — ABNORMAL HIGH (ref 6–20)
CALCIUM: 8.4 mg/dL — AB (ref 8.9–10.3)
CO2: 29 mmol/L (ref 22–32)
Chloride: 95 mmol/L — ABNORMAL LOW (ref 101–111)
Creatinine, Ser: 1.22 mg/dL (ref 0.61–1.24)
GFR calc Af Amer: >60 mL/min (ref >60)
GFR calc non Af Amer: 53 mL/min — ABNORMAL LOW (ref >60)
GLUCOSE: 97 mg/dL (ref 65–99)
POTASSIUM: 2.9 mmol/L — AB (ref 3.5–5.1)
SODIUM: 134 mmol/L — AB (ref 135–145)
TOTAL PROTEIN: 6.3 g/dL — AB (ref 6.5–8.1)

## 2016-08-05 MED ORDER — POTASSIUM CHLORIDE CRYS ER 20 MEQ PO TBCR
40.0000 meq | EXTENDED_RELEASE_TABLET | Freq: Three times a day (TID) | ORAL | Status: AC
  Administered 2016-08-05 – 2016-08-06 (×3): 40 meq via ORAL
  Filled 2016-08-05 (×3): qty 2

## 2016-08-05 MED ORDER — FUROSEMIDE 10 MG/ML IJ SOLN
60.0000 mg | Freq: Three times a day (TID) | INTRAMUSCULAR | Status: DC
  Administered 2016-08-05 – 2016-08-09 (×11): 60 mg via INTRAVENOUS
  Filled 2016-08-05 (×11): qty 6

## 2016-08-05 NOTE — Progress Notes (Signed)
PT Cancellation Note  Patient Details Name: [name] MRN: [id number] DOB: Oct 02, 1932   Cancelled Treatment:    Reason Eval/Treat Not Completed: Patient declined, no reason specified Patient states he does not want to get up today. Reports his feet are too swollen and painful to work with PT. Of note, patient reports he does not want to leave the hospital and plans to pass away here. RN notified.   Ellouise Newer 08/05/2016, 2:57 PM   [phone]

## 2016-08-05 NOTE — Progress Notes (Addendum)
[facility] PROGRESS NOTE  [name]:[id number] DOB: 1932-04-03 DOA: 08/03/2016   PCP: [name], MD     Assessment/Plan: Principal Problem:   Acute on chronic respiratory failure ([place]) Active Problems:   Chronic a-fib ([place])   Pacemaker   Black lung disease ([place])   Anemia   Acute on chronic combined systolic and diastolic CHF (congestive heart failure) (HCC)   Chronic renal disease, stage III   DM2 (diabetes mellitus, type 2) (H[facility])   COPD exacerbation ([place])   Palliative care by specialist    83 y.o. male  with past medical history of sCHF with EF 10-15%, ischemic cardiomyopathy with pacemaker placement, HTN, CAD s/p CABG, A. Fib, severe lung disease due to COPD and black lung disease, on home O2 2.5L, and DM2. This is his fourth admission since January, with his most recent stay 5/15-5/17 as a GIP admission for CHF. He had hospice through [facility] initiated in February at the recommendation of his Cardiologist, Dr. Percival Spanish. On 5/24 he formally stopped Hospice services, citing a desire for further treatment options. He then presented to the ED and was admitted on 08/03/2016 with CHF exacerbation. Palliative consulted to assist in clarifying goals of care.   Assessment and plan #1. Acute on chronic respiratory failure likely related to acute on chronic combined systolic and diastolic heart failure in the setting of COPD. Patient was discharged days ago for same. He reports compliance with his medications. Echo in November 2017 an EF of 15% diffus hypokinesis. BNP 2772. Initial troponin negative. EKG as noted above. Chest x-ray with mild CHF and small bilateral pleural effusions worse than on exam 10 days ago. He was provided Lasix 80 mg in the emergency department. Has voided almost  2050 in the last 24 hours Continue telemetry- paced rhythm -Lasix 60 mg IV twice a day, renal function stable, increase to TID  -Weight up by 9 pounds since discharge, now 142>141 -Wean  oxygen to baseline is able -Scheduled nebulizers  #2. Acute on chronic combined CHF. See #1. Chart review indicates at discharge 5 days ago his home Lasix was increased to 40 mg twice a day.  [name] He does not weigh himself daily -Home meds include Lasix, imdur, metoprolol -Continue home meds -See #1  #3. Chronic A. fib on Eliquis and metoprolol. Rate controlled.Mali vasc score 5.  -Monitor on telemetry -Continue home meds  #4. COPD/black lung disease/chronic respiratory failure. He is on home oxygen at 2.5 L 24/ 7. Home medications include albuterol inhaler and nebulizer -Continue oxygen supplementation -Oxygen to baseline as able -Scheduled nebulizers -See #1  #5. Chronic kidney disease stage III. Creatinine on admission 1.33. This appears to be close to baseline. -Monitor urine output -Hold nephrotoxins as able  6. Anemia. Chronic. Likely related to chronic illness. Hemoglobin 8.8 on admission, now 7.6. Chart review indicates this is close to his baseline. No signs symptoms of active bleeding. He is on elequis -monitor -fobt  if drops further consider stopping eliquis ?  7. Hypokalemia  Replete     DVT prophylaxsis eliquis  Code Status:  DO NOT RESUSCITATE    Family Communication: Discussed in detail with the patient, all imaging results, lab results explained to the patient   Disposition Plan:   1-2 days      Consultants:  Cardiology  Procedures:  None  Antibiotics: Anti-infectives    None         HPI/Subjective: Complaining of left leg hurting, no  Sob, " u must be crazy to think about discharging me today" when I say nothing about dc at all, RN present in the room   Objective: Vitals:   08/04/16 2204 08/05/16 0647 08/05/16 0758 08/05/16 0848  BP: 117/61 (!) 112/55    Pulse: 65 64    Resp: 18 18    Temp: 97.8 F (36.6 C) 97.4 F (36.3 C)    TempSrc: Oral Oral    SpO2: 100% 100%  99%  Weight:   64 kg (141 lb)   Height:         Intake/Output Summary (Last 24 hours) at 08/05/16 1502 Last data filed at 08/05/16 1202  Gross per 24 hour  Intake              240 ml  Output             1650 ml  Net            -1410 ml    Exam:  Examination:  General exam: Appears calm and comfortable  Respiratory system: Clear to auscultation. Respiratory effort normal. Cardiovascular system: S1 & S2 heard, RRR. No JVD, murmurs, rubs, gallops or clicks. 2 + pedal edema. Gastrointestinal system: Abdomen is nondistended, soft and nontender. No organomegaly or masses felt. Normal bowel sounds heard. Central nervous system: Alert and oriented. No focal neurological deficits. Extremities: Symmetric 5 x 5 power. Skin: No rashes, lesions or ulcers Psychiatry: Judgement and insight appear normal. Mood & affect appropriate.     Data Reviewed: I have personally reviewed following labs and imaging studies  Micro Results No results found for this or any previous visit (from the past 240 hour(s)).  Radiology Reports Dg Chest 2 View  Result Date: 08/03/2016 CLINICAL DATA:  83 year old with bilateral lower extremity edema and shortness of breath which began 3 days ago. Current history of black lung disease. Personal history of prostate cancer and lung cancer. Oxygen dependent COPD. EXAM: CHEST  2 VIEW COMPARISON:  07/24/2016, 07/06/2016 and earlier, including CT chest 08/07/2013. FINDINGS: Sternotomy for CABG. Left subclavian biventricular pacing defibrillator unchanged and appears intact. Residual right subclavian ventricular lead unchanged. Cardiac silhouette moderately to markedly enlarged, unchanged. Thoracic aorta atherosclerotic, unchanged. Hilar and mediastinal contours otherwise unremarkable. Mild diffuse interstitial pulmonary edema and small bilateral pleural effusions, increased since the examination 10 days ago. No confluent airspace consolidation. Degenerative changes involving the thoracic and upper lumbar spine. IMPRESSION:  Mild CHF and small bilateral pleural effusions, worse than on the examination 10 days ago. Electronically Signed   By: Evangeline Dakin M.D.   On: 08/03/2016 12:19   Dg Chest 2 View  Result Date: 07/24/2016 CLINICAL DATA:  Shortness of breath. EXAM: CHEST  2 VIEW COMPARISON:  July 06, 2016 FINDINGS: No pneumothorax. Cardiomegaly. Bilateral pulmonary opacities could represent developing infiltrate or edema. AICD device is identified. No other acute abnormalities are seen. IMPRESSION: Cardiomegaly. Bilateral pulmonary opacities. The pulmonary opacities are not specific based on imaging. History of shortness of breath with leg swelling and no cough suggests edema. Developing infiltrate not excluded on this study. Recommend clinical correlation and follow-up to resolution. Electronically Signed   By: Dorise Bullion III M.D   On: 07/24/2016 17:33   Dg Chest 2 View  Result Date: 07/06/2016 CLINICAL DATA:  83 year old male with right lower abdominal pain and nausea. Shortness of breath. EXAM: CHEST  2 VIEW COMPARISON:  Chest radiograph dated 04/02/2016 FINDINGS: There is stable moderate cardiomegaly. No vascular congestion or edema. Median  sternotomy wires, CABG vascular clips, and left pectoral AICD device noted. Retained pacemaker wire in the right chest wall noted. Small focal hazy density along the lateral aspect of the minor fissure likely corresponds to the scarring seen on the CT. There is chronic changes of the left lung base with areas of scarring and pleural thickening. No focal consolidation, pleural effusion, or pneumothorax. There is degenerative changes of the spine. No acute osseous pathology. IMPRESSION: 1. No acute cardiopulmonary process. 2. Stable moderate cardiomegaly.  No vascular congestion or edema. 3. Chronic changes and scarring predominantly at the left lung base. Electronically Signed   By: Anner Crete M.D.   On: 07/06/2016 21:56   Ct Abdomen Pelvis W Contrast  Result Date:  07/07/2016 CLINICAL DATA:  Right lower quadrant pain EXAM: CT ABDOMEN AND PELVIS WITH CONTRAST TECHNIQUE: Multidetector CT imaging of the abdomen and pelvis was performed using the standard protocol following bolus administration of intravenous contrast. CONTRAST:  164mL ISOVUE-300 IOPAMIDOL (ISOVUE-300) INJECTION 61% COMPARISON:  01/17/2016 FINDINGS: Lower chest: Small right greater than left pleural effusions. Calcified pleural plaque on the left posteriorly. Stable cardiomegaly with right atrial, coronary sinus and right ventricular leads noted. Chronic rounded atelectasis at the left lung base posteriorly. Bibasilar dependent and subsegmental atelectasis. Hepatobiliary: Gallbladder is contracted. No gallstones are seen. No space-occupying mass of liver. No ductal dilatation. Pancreas: No pancreatic ductal dilatation or focal mass. Spleen: Normal Adrenals/Urinary Tract: Normal adrenal glands. Right lower pole nonobstructing 8.5 mm renal stone. Stable left lower pole 3 cm exophytic renal cyst. This exophytic cyst again demonstrates peripheral thin mural calcifications. Stomach/Bowel: The stomach is distended with contrast and fluid. There is no bowel obstruction. There is mild diffuse distention of small intestine approximately, nonspecific in etiology. Mild enteritis is not excluded. Circular muscle hypertrophy and sigmoid diverticulosis noted as before. Vascular/Lymphatic: Aortoiliac and branch vessel atherosclerosis. No aortic aneurysm or dissection. No lymphadenopathy. Reproductive: Radiation seeds noted of the normal size prostate. Other: No free air or free fluid. Musculoskeletal: Schmorl's node involving the superior endplate of L4. Degenerative disc disease L2-3 and L4-5. Multilevel osteophytes. IMPRESSION: Findings suggest mild enteritis. Sigmoid diverticulosis with circular muscle hypertrophy. Complex lower pole 3 cm exophytic cyst with thin peripheral calcifications suggesting a Bosniak category 2 renal  cyst. Nonobstructing stable right lower pole 8.5 mm renal calculus. Small bilateral pleural effusions. Left-sided calcified pleural plaque consistent with prior asbestos exposure. Electronically Signed   By: Ashley Royalty M.D.   On: 07/07/2016 02:36     CBC  Recent Labs Lab 08/03/16 1107 08/05/16 0438  WBC 4.0 3.6*  HGB 8.8* 7.6*  HCT 27.9* 24.7*  PLT 127* 116*  MCV 96.9 98.4  MCH 30.6 30.3  MCHC 31.5 30.8  RDW 20.1* 20.4*    Chemistries   Recent Labs Lab 08/03/16 1107 08/04/16 0337 08/05/16 0438  NA 131* 133* 134*  K 4.6 3.9 2.9*  CL 95* 96* 95*  CO2 22 26 29   GLUCOSE 95 95 97  BUN 38* 39* 27*  CREATININE 1.33* 1.45* 1.22  CALCIUM 9.1 8.9 8.4*  AST  --   --  20  ALT  --   --  27  ALKPHOS  --   --  112  BILITOT  --   --  1.2   ------------------------------------------------------------------------------------------------------------------ estimated creatinine clearance is 37.6 mL/min (by C-G formula based on SCr of 1.22 mg/dL). ------------------------------------------------------------------------------------------------------------------ No results for input(s): HGBA1C in the last 72 hours. ------------------------------------------------------------------------------------------------------------------ No results for input(s): CHOL, HDL, LDLCALC, TRIG,  CHOLHDL, LDLDIRECT in the last 72 hours. ------------------------------------------------------------------------------------------------------------------ No results for input(s): TSH, T4TOTAL, T3FREE, THYROIDAB in the last 72 hours.  Invalid input(s): FREET3 ------------------------------------------------------------------------------------------------------------------ No results for input(s): VITAMINB12, FOLATE, FERRITIN, TIBC, IRON, RETICCTPCT in the last 72 hours.  Coagulation profile No results for input(s): INR, PROTIME in the last 168 hours.  No results for input(s): DDIMER in the last 72  hours.  Cardiac Enzymes  Recent Labs Lab 08/03/16 1835  TROPONINI 0.03*   ------------------------------------------------------------------------------------------------------------------ Invalid input(s): POCBNP   CBG: No results for input(s): GLUCAP in the last 168 hours.     Studies: No results found.    Lab Results  Component Value Date   HGBA1C 5.4 10/27/2015   HGBA1C 5.6 05/14/2011   HGBA1C 5.6 02/10/2011   Lab Results  Component Value Date   LDLCALC 92 10/27/2015   CREATININE 1.22 08/05/2016       Scheduled Meds: . albuterol  2.5 mg Nebulization TID  . allopurinol  300 mg Oral Daily  . apixaban  2.5 mg Oral BID  . furosemide  60 mg Intravenous Q8H  . isosorbide mononitrate  30 mg Oral Daily  . metoprolol succinate  25 mg Oral Daily  . pantoprazole  40 mg Oral Daily  . potassium chloride  40 mEq Oral TID  . sodium chloride flush  3 mL Intravenous Q12H  . vitamin B-12  1,000 mcg Oral BID   Continuous Infusions: . sodium chloride       LOS: 2 days    Time spent: >30 MINS    Reyne Dumas  [facility] Pager [phone]. If 7PM-7AM, please contact night-coverage at [email or web address], password [facility] 08/05/2016, 3:02 PM  LOS: 2 days

## 2016-08-05 NOTE — Discharge Instructions (Signed)

## 2016-08-05 NOTE — Progress Notes (Signed)
Pharmacist Heart Failure Core Measure Documentation  Assessment: [name] has an EF documented as 10-15% on 01/18/16 by TTE.  Rationale: Heart failure patients with left ventricular systolic dysfunction (LVSD) and an EF < 40% should be prescribed an angiotensin converting enzyme inhibitor (ACEI) or angiotensin receptor blocker (ARB) at discharge unless a contraindication is documented in the medical record.  This patient is not currently on an ACEI or ARB for HF.  This note is being placed in the record in order to provide documentation that a contraindication to the use of these agents is present for this encounter.  ACE Inhibitor or Angiotensin Receptor Blocker is contraindicated (specify all that apply)  []   ACEI allergy AND ARB allergy []   Angioedema []   Moderate or severe aortic stenosis []   Hyperkalemia []   Hypotension []   Renal artery stenosis [x]   Worsening renal function, preexisting renal disease or dysfunction   Lawson Radar 08/05/2016 3:08 PM

## 2016-08-05 NOTE — Evaluation (Signed)
Occupational Therapy Evaluation Patient Details Name: [name] MRN: [id number] DOB: [birth year] Today's Date: 08/05/2016    History of Present Illness Pt is an 83 y.o. male who presented to the ED with shortness of breath and lower extremity edema with MD evaluation recealing acute on chronic respiratory failure likely related to acute on chronic combined systolic diastolic heart failure. Pt was recently discharged for the same 5 days ago. He has a PMH significant for but not limited to black lung disease, CAD, carotid artery occlusion, chronic atrial gibrillation, chronic respiratory failure, COPD, CVA, DM, GERD, gout, hypercholesterolemia, hypertension, and ischemic cardiomyopathy.    Clinical Impression   PTA, pt reports that he was independent with RW for basic ADL but limits mobility due to cardiopulmonary limitations. His daughter assists with cooking and home management and lives with pt but works during the day. Pt currently requires min assist for toilet transfers and LB ADL with supervision for seated ADL. He presents with decreased standing balance, decreased activity tolerance for ADL, decreased understanding of energy conservation strategies, and generalized weakness. Pt would benefit from SNF placement for continued rehabilitation post-acute D/C in order to improve safety prior to D/C home. If pt not agreeable to SNF level rehabilitation will require 24 hour assistance and home health OT services along with home health aide. OT will continue to follow acutely.     Follow Up Recommendations  SNF;Supervision/Assistance - 24 hour    Equipment Recommendations  Other (comment) (TBD at next venue of care)    Recommendations for Other Services       Precautions / Restrictions Precautions Precautions: Fall;ICD/Pacemaker Restrictions Weight Bearing Restrictions: No      Mobility Bed Mobility Overal bed mobility: Needs Assistance Bed Mobility: Supine to Sit;Sit to Supine      Supine to sit: Supervision Sit to supine: Supervision   General bed mobility comments: Supervision for safety.  Transfers Overall transfer level: Needs assistance Equipment used: Rolling walker (2 wheeled) Transfers: Sit to/from Stand Sit to Stand: Min assist         General transfer comment: Min assist for steadying.     Balance Overall balance assessment: Needs assistance Sitting-balance support: No upper extremity supported;Feet supported Sitting balance-Leahy Scale: Fair     Standing balance support: During functional activity;Bilateral upper extremity supported Standing balance-Leahy Scale: Poor Standing balance comment: Reliant on UE support for safety.                           ADL either performed or assessed with clinical judgement   ADL Overall ADL's : Needs assistance/impaired Eating/Feeding: Set up;Sitting   Grooming: Supervision/safety;Sitting   Upper Body Bathing: Supervision/ safety;Sitting   Lower Body Bathing: Minimal assistance;Sit to/from stand   Upper Body Dressing : Supervision/safety;Sitting   Lower Body Dressing: Minimal assistance;Sit to/from stand   Toilet Transfer: Minimal assistance;Ambulation;BSC;RW Toilet Transfer Details (indicate cue type and reason): Pt refusing to properly utilize RW. Toileting- Clothing Manipulation and Hygiene: Minimal assistance;Sit to/from stand       Functional mobility during ADLs: Minimal assistance;Rolling walker General ADL Comments: Pt with poor activity tolerance for ADL with dyspnea on exertion with light activity. Requiring min assist for all standing and mobility due to decreased balance.     Vision Patient Visual Report: No change from baseline Vision Assessment?: No apparent visual deficits     Perception     Praxis      Pertinent Vitals/Pain Pain Assessment: Faces Faces  Pain Scale: Hurts little more Pain Location: L LE Pain Descriptors / Indicators: Aching Pain  Intervention(s): Limited activity within patient's tolerance;Monitored during session;Repositioned     Hand Dominance Right   Extremity/Trunk Assessment Upper Extremity Assessment Upper Extremity Assessment: Generalized weakness   Lower Extremity Assessment Lower Extremity Assessment: Generalized weakness       Communication     Cognition Arousal/Alertness: Awake/alert Behavior During Therapy: WFL for tasks assessed/performed Overall Cognitive Status: Within Functional Limits for tasks assessed                                     General Comments       Exercises     Shoulder Instructions      Home Living Family/patient expects to be discharged to:: Private residence Living Arrangements: Children (daughter) Available Help at Discharge: Family;Available PRN/intermittently Type of Home: House Home Access: Level entry     Home Layout: One level     Bathroom Shower/Tub: Tub/shower unit         Home Equipment: Environmental consultant - 2 wheels;Walker - 4 wheels;Bedside commode;Shower seat;Hospital bed (oxygen (2.5 L/min))          Prior Functioning/Environment Level of Independence: Needs assistance  Gait / Transfers Assistance Needed: Uses RW for mobility.  ADL's / Homemaking Assistance Needed: Daughters assist with bathing (doesn't use shower, does dponge baths only). Dresses himself.   Comments: Has cats and dogs at home.        OT Problem List: Decreased strength;Decreased range of motion;Decreased activity tolerance;Impaired balance (sitting and/or standing);Decreased safety awareness;Decreased knowledge of use of DME or AE;Decreased knowledge of precautions;Cardiopulmonary status limiting activity;Pain      OT Treatment/Interventions: Self-care/ADL training;Therapeutic exercise;Energy conservation;DME and/or AE instruction;Therapeutic activities;Patient/family education;Balance training    OT Goals(Current goals can be found in the care plan section)  Acute Rehab OT Goals Patient Stated Goal: to have less leg pain OT Goal Formulation: With patient Time For Goal Achievement: 08/19/16 Potential to Achieve Goals: Good ADL Goals Pt Will Perform Grooming: (P) with modified independence;standing (3 tasks) Pt Will Perform Upper Body Dressing: (P) with modified independence;sitting Pt Will Perform Lower Body Dressing: (P) with modified independence;sit to/from stand Pt Will Transfer to Toilet: (P) with modified independence;bedside commode;ambulating Pt Will Perform Toileting - Clothing Manipulation and hygiene: (P) with modified independence;sit to/from stand  OT Frequency: Min 2X/week   Barriers to D/C:            Co-evaluation              AM-PAC PT "6 Clicks" Daily Activity     Outcome Measure Help from another person eating meals?: None Help from another person taking care of personal grooming?: A Little Help from another person toileting, which includes using toliet, bedpan, or urinal?: A Little Help from another person bathing (including washing, rinsing, drying)?: A Little Help from another person to put on and taking off regular upper body clothing?: A Little Help from another person to put on and taking off regular lower body clothing?: A Little 6 Click Score: 19   End of Session Equipment Utilized During Treatment: Gait belt;Rolling walker Nurse Communication: Mobility status  Activity Tolerance: Patient tolerated treatment well Patient left: in chair;with call bell/phone within reach;with chair alarm set  OT Visit Diagnosis: Unsteadiness on feet (R26.81);Muscle weakness (generalized) (M62.81)  Time: 4765-4650 OT Time Calculation (min): 24 min Charges:  OT General Charges $OT Visit: 1 Procedure OT Evaluation $OT Eval Moderate Complexity: 1 Procedure OT Treatments $Self Care/Home Management : 8-22 mins G-Codes:     Norman Herrlich, MS OTR/L  Pager: [place] A Dak Szumski 08/05/2016, 4:16  PM

## 2016-08-06 ENCOUNTER — Encounter (HOSPITAL_COMMUNITY): Payer: Self-pay | Admitting: Physician Assistant

## 2016-08-06 LAB — COMPREHENSIVE METABOLIC PANEL
ALT: 22 U/L (ref 17–63)
AST: 30 U/L (ref 15–41)
Albumin: 2.9 g/dL — ABNORMAL LOW (ref 3.5–5.0)
Alkaline Phosphatase: 120 U/L (ref 38–126)
Anion gap: 9 (ref 5–15)
BUN: 24 mg/dL — ABNORMAL HIGH (ref 6–20)
CALCIUM: 8.7 mg/dL — AB (ref 8.9–10.3)
CO2: 29 mmol/L (ref 22–32)
CREATININE: 1.12 mg/dL (ref 0.61–1.24)
Chloride: 93 mmol/L — ABNORMAL LOW (ref 101–111)
GFR calc non Af Amer: 59 mL/min — ABNORMAL LOW (ref >60)
GLUCOSE: 95 mg/dL (ref 65–99)
Potassium: 4.8 mmol/L (ref 3.5–5.1)
SODIUM: 131 mmol/L — AB (ref 135–145)
Total Bilirubin: 1.7 mg/dL — ABNORMAL HIGH (ref 0.3–1.2)
Total Protein: 7 g/dL (ref 6.5–8.1)

## 2016-08-06 LAB — CBC
HEMATOCRIT: 26.2 % — AB (ref 39.0–52.0)
HEMOGLOBIN: 8.1 g/dL — AB (ref 13.0–17.0)
MCH: 30.2 pg (ref 26.0–34.0)
MCHC: 30.9 g/dL (ref 30.0–36.0)
MCV: 97.8 fL (ref 78.0–100.0)
Platelets: 123 10*3/uL — ABNORMAL LOW (ref 150–400)
RBC: 2.68 MIL/uL — AB (ref 4.22–5.81)
RDW: 20.3 % — ABNORMAL HIGH (ref 11.5–15.5)
WBC: 3.8 10*3/uL — ABNORMAL LOW (ref 4.0–10.5)

## 2016-08-06 NOTE — Evaluation (Signed)
Physical Therapy Evaluation Patient Details Name: [name] MRN: [id number] DOB: [birth year] Today's Date: 08/06/2016   History of Present Illness  Pt is an 83 y.o. male who presented to the ED with shortness of breath and lower extremity edema with MD evaluation recealing acute on chronic respiratory failure likely related to acute on chronic combined systolic diastolic heart failure. Pt was recently discharged for the same 5 days ago. He has a PMH significant for but not limited to black lung disease, CAD, carotid artery occlusion, chronic atrial gibrillation, chronic respiratory failure, COPD, CVA, DM, GERD, gout, hypercholesterolemia, hypertension, and ischemic cardiomyopathy.   Clinical Impression  Patient presents with generalized weakness, deconditioning, decreased balance and impaired mobility s/p above. Tolerated gait training with Min guard assist for safety/balance. VSS throughout mobility. Reports having a decent day but most days does not feel great. Continues to report pain and swelling in bil feet, left worse than right. Pt lives with daughter who works during the day. SOme assist needed for bathing, otherwise Mod I with RW. Will follow acutely to maximize independence and mobility prior to return home.    Follow Up Recommendations Home health PT;Supervision - Intermittent    Equipment Recommendations  None recommended by PT    Recommendations for Other Services       Precautions / Restrictions Precautions Precautions: Fall Restrictions Weight Bearing Restrictions: No      Mobility  Bed Mobility               General bed mobility comments: Up in chair upon PT arrival.  Transfers Overall transfer level: Needs assistance Equipment used: Rolling walker (2 wheeled) Transfers: Sit to/from Stand Sit to Stand: Min guard         General transfer comment: Min guard for safety.   Ambulation/Gait Ambulation/Gait assistance: Min guard Ambulation Distance  (Feet): 150 Feet Assistive device: Rolling walker (2 wheeled) Gait Pattern/deviations: Step-through pattern;Decreased stride length;Trunk flexed Gait velocity: decreased   General Gait Details: Unsteady gait with cues for RW management/safety. Sp02 >95% in 2L/min 02. HR stable.  Stairs            Wheelchair Mobility    Modified Rankin (Stroke Patients Only)       Balance Overall balance assessment: Needs assistance Sitting-balance support: No upper extremity supported;Feet supported Sitting balance-Leahy Scale: Fair     Standing balance support: During functional activity Standing balance-Leahy Scale: Fair Standing balance comment: Able to stand statically without UE support but requires UE support for dynamic standing.                             Pertinent Vitals/Pain Pain Assessment: Faces Faces Pain Scale: Hurts little more Pain Location: bil feet Pain Descriptors / Indicators: Aching;Sore Pain Intervention(s): Monitored during session;Repositioned    Home Living Family/patient expects to be discharged to:: Private residence Living Arrangements: Children (daughter) Available Help at Discharge: Family;Available PRN/intermittently Type of Home: House Home Access: Level entry     Home Layout: One level Home Equipment: Walker - 2 wheels;Walker - 4 wheels;Bedside commode;Shower seat;Hospital bed (02 at 2.5 L)      Prior Function Level of Independence: Needs assistance   Gait / Transfers Assistance Needed: Uses RW for mobility.   ADL's / Homemaking Assistance Needed: Daughters assist with bathing (doesn't use shower, does dponge baths only). Dresses himself.  Comments: Has cats and dogs at home.     Hand Dominance   Dominant Hand:  Right    Extremity/Trunk Assessment   Upper Extremity Assessment Upper Extremity Assessment: Defer to OT evaluation    Lower Extremity Assessment Lower Extremity Assessment: Generalized weakness;RLE  deficits/detail;LLE deficits/detail RLE Deficits / Details: Edema present distal RLE into foot LLE Deficits / Details: Edema present LLE worsen in foot, painful to touch       Communication   Communication: HOH  Cognition Arousal/Alertness: Awake/alert Behavior During Therapy: WFL for tasks assessed/performed Overall Cognitive Status: Within Functional Limits for tasks assessed                                        General Comments      Exercises     Assessment/Plan    PT Assessment Patient needs continued PT services  PT Problem List Decreased strength;Decreased mobility;Pain;Decreased balance;Decreased skin integrity       PT Treatment Interventions Therapeutic activities;Gait training;Therapeutic exercise;Balance training;Functional mobility training;Patient/family education    PT Goals (Current goals can be found in the Care Plan section)  Acute Rehab PT Goals Patient Stated Goal: to have less leg pain PT Goal Formulation: With patient Time For Goal Achievement: 08/20/16 Potential to Achieve Goals: Good    Frequency Min 3X/week   Barriers to discharge Decreased caregiver support daughter works during the day    Co-evaluation               AM-PAC PT "6 Clicks" Daily Activity  Outcome Measure Difficulty turning over in bed (including adjusting bedclothes, sheets and blankets)?: None Difficulty moving from lying on back to sitting on the side of the bed? : None Difficulty sitting down on and standing up from a chair with arms (e.g., wheelchair, bedside commode, etc,.)?: None Help needed moving to and from a bed to chair (including a wheelchair)?: A Little Help needed walking in hospital room?: A Little Help needed climbing 3-5 steps with a railing? : A Lot 6 Click Score: 20    End of Session Equipment Utilized During Treatment: Gait belt;Oxygen Activity Tolerance: Patient tolerated treatment well Patient left: in chair;with call  bell/phone within reach;with chair alarm set Nurse Communication: Mobility status PT Visit Diagnosis: Unsteadiness on feet (R26.81);Pain Pain - Right/Left:  (both) Pain - part of body: Ankle and joints of foot    Time: 1610-9604 PT Time Calculation (min) (ACUTE ONLY): 13 min   Charges:   PT Evaluation $PT Eval Low Complexity: 1 Procedure     PT G Codes:        Wray Kearns, PT, DPT [phone]    Marguarite Arbour A Jair Lindblad 08/06/2016, 9:36 AM

## 2016-08-06 NOTE — Progress Notes (Signed)
Patient has had no complaints this night.  Feet with edema bilaterally.  Left more edematous than right.  Left great toe slightly red, blanchable.  Remains on 1L O2 via nasal cannula.  Bed alarm on.  Safety maintained.

## 2016-08-06 NOTE — Consult Note (Signed)
Cardiology Consultation:   Patient ID: [name]; [id number]; 07/14/1932   Admit date: 08/03/2016 Date of Consult: 08/06/2016  Primary Care Provider: [name], MD Primary Cardiologist: Dr. Percival Spanish Primary Electrophysiologist:  Dr. Lovena Le  Patient Profile:   [name] is a 83 y.o. male with a hx of Chronic systolic CHF (last EF 68-12%), h/o CRT-D (with [employer] gen change 2015 to PACEMAKER ONLY), CAD s/p CABG 2004 chronic atrial fib, CKD III, hypotension, carotid artery disease, chronic respiratory failure on home O2, CVA, DM, gout, GERD, HTN, HLD, lung CA, prostate CA, bradycardia, possible GIB 11/6-11/12/2015 (xarelto held then restarted at DC), admit for LE ischemia s/p thombectomy 11/10-11/16/2017 (Xarelto changed to Eliquis), hyponatremia whom we are asked to see for CHF by Dr. Allyson Sabal.   History of Present Illness:   He presented to [facility] with worsening SOB and lower extremity edema. Per primary cardiology notes there have been prior discussions regarding palliative approach but patient/family wishes have pursued continued aggressive management. This is his 6th admission in 12 months, the last of which he was just discharged 5/15-5/17 for CHF, dc weight recorded at 113lb. During that admission he required IV diuresis and titration of his oral dose. He presented back to [facility] with recurrent worsening of similar sx - SOB, nonproductive cough, worsening edema. In the ED he was hemodynamically stable except mildly tachypneic. BNP 2772, troponin 0.03, hyponatremic intermtitently to 131, albumin 2.9, WBC 3.8, Hgb variable from 7-8 range, plt 123, CXR with Mild CHF and small bilateral pleural effusions, worse than on the examination 10 days ago. Despite diuresis, his weight appears to be increasing in the computer from 133->142->143 but I/O's recorded as -4.7L. He's been getting varying doses of IV Lasix, most recently 60mg  q8hr. Cr fairly stable at 1.12 (improved from admission value of 1.56).  Last echo 01/2016 showed EF 10-15%, mild AS (but could be underrepresented due to low EF), mild MR, massive LAE, severely reduced RV function, severe RAE, mild TR. Patient is generally a poor historian.  Past Medical History:  Diagnosis Date  . Arthritis   . Black lung disease ([place])   . CAD (coronary artery disease)    a. 07/2002 CABG x 3: LIMA->LAD, VG->Diag, VG->OM;  b. 06/2006 Cath: LM 50-60ost/p, LAD patent mid stent, D1 sev dzs, D2 patent stent, LCX nl, OM2 sev sten prox, RCA large/nl, VG->Diag nl, VG->OM nl, LIMA->LAD atretic, EF 30%.  . Carotid artery occlusion   . Chronic atrial fibrillation ([name])   . Chronic respiratory failure ([place])   . Chronic systolic CHF (congestive heart failure) ([place])    a. 12/2012 Echo: EF 20-25%, mid-dist antsept AK, mod dil LA.  [name] COPD (chronic obstructive pulmonary disease) ([place])    a. On home O2.  [name] CVA (cerebral vascular accident) ([place])   . Diabetes mellitus   . GERD (gastroesophageal reflux disease)   . Gout   . Hypercholesterolemia   . Hypertension   . Ischemic cardiomyopathy    a. 06/2006  BIV-ICD -> gen change 2015 to [place].  . Lung cancer ([place])   . Nephrolithiasis 09/2000  . On home oxygen therapy    "2.5L; 24/7" (07/25/2016)  . Prostate cancer ([place]) 11/01/10   gleason 7, 8, 9, gold seeds 02/08/11  . Symptomatic bradycardia     Past Surgical History:  Procedure Laterality Date  . BI-VENTRICULAR PACEMAKER INSERTION (CRT-P)  12-02-13   downgrade of previously implanted CRTD to STJ CRTP  . BIV PACEMAKER GENERATOR CHANGE OUT N/A  12/02/2013   Procedure: BIV PACEMAKER GENERATOR CHANGE OUT;  Surgeon: Evans Lance, MD;  Location: [facility] CATH LAB;  Service: Cardiovascular;  Laterality: N/A;  . CATARACT EXTRACTION W/ INTRAOCULAR LENS  IMPLANT, BILATERAL Bilateral   . COLONOSCOPY    . CORONARY ANGIOPLASTY WITH STENT PLACEMENT  07/1997; 08/1997;03/1998  . CORONARY ARTERY BYPASS GRAFT  07/2002   CABG X3  . ESOPHAGOGASTRODUODENOSCOPY  02/19/2011    Procedure: ESOPHAGOGASTRODUODENOSCOPY (EGD);  Surgeon: Jeryl Columbia, MD;  Location: [facility] ENDOSCOPY;  Service: Endoscopy;  Laterality: N/A;  . ESOPHAGOGASTRODUODENOSCOPY    . FEMORAL-POPLITEAL BYPASS GRAFT Right 01/20/2016   Procedure: THROMBECTOMY OF POPLITEAL ARTERY AND ANTERIOR TIBIAL ARTERY RIGHT LEG; INTRAOPERATIVE ARTERIOGRAM;  Surgeon: Waynetta Sandy, MD;  Location: [facility];  Service: Vascular;  Laterality: Right;  . INCISION AND DRAINAGE OF WOUND  08/2002   right thigh; S/P EVH  . INSERT / REPLACE / Victoria Vera; 1992; 01/2000;  . INSERT / REPLACE / REMOVE PACEMAKER  09/2003; 06/2006   w/AICD  . INSERT / REPLACE / REMOVE PACEMAKER  12/2004   pacmaker explant  . SHOULDER ARTHROSCOPY W/ ROTATOR CUFF REPAIR  05/2008   left     Inpatient Medications: Scheduled Meds: . albuterol  2.5 mg Nebulization TID  . allopurinol  300 mg Oral Daily  . apixaban  2.5 mg Oral BID  . furosemide  60 mg Intravenous Q8H  . isosorbide mononitrate  30 mg Oral Daily  . metoprolol succinate  25 mg Oral Daily  . pantoprazole  40 mg Oral Daily  . sodium chloride flush  3 mL Intravenous Q12H  . vitamin B-12  1,000 mcg Oral BID   Continuous Infusions: . sodium chloride     PRN Meds: sodium chloride, acetaminophen, albuterol, ALPRAZolam, nitroGLYCERIN, ondansetron (ZOFRAN) IV, ondansetron, sodium chloride flush  Allergies:    Allergies  Allergen Reactions  . [place] [Digoxin] Other (See Comments)    Caused patient to have chest pains and dizzy spells  . Aspirin Hives  . Penicillins Rash    Rash Has patient had a PCN reaction causing immediate rash, facial/tongue/throat swelling, SOB or lightheadedness with hypotension:YES Has patient had a PCN reaction causing severe rash involving mucus membranes or skin necrosis: NO Has patient had a PCN reaction that required hospitalization NO Has patient had a PCN reaction occurring within the last 10 years:NO If all of the above answers are "NO",  then may proceed with Cephalosporin use.    Social History:   Social History   Social History  . Marital status: Widowed    Spouse name: N/A  . Number of children: N/A  . Years of education: N/A   Occupational History  . retired Retired    Equities trader   Social History Main Topics  . Smoking status: Former Smoker    Types: Cigarettes    Quit date: 03/12/1949  . Smokeless tobacco: Current User    Types: Chew  . Alcohol use No     Comment: drank until per pt 03/19/11, h/o heavy use  . Drug use: No  . Sexual activity: Yes   Other Topics Concern  . Not on file   Social History Narrative  . No narrative on file    Family History:   The patient's family history includes Alzheimer's disease (age of onset: 42) in his father; Cancer (age of onset: 11) in his father; Diabetes in his brother, brother, and brother; Diabetes (age of onset: 51) in his sister. There is  no history of Hypotension, Malignant hyperthermia, or Pseudochol deficiency.  ROS:  Please see the history of present illness.  All other ROS reviewed and negative.     Physical Exam/Data:   Vitals:   08/06/16 1610 08/06/16 0813 08/06/16 1308 08/06/16 1618  BP:    116/60  Pulse:    94  Resp:    20  Temp:    97.7 F (36.5 C)  TempSrc:    Oral  SpO2:  97% 97% (!) 83%  Weight: 143 lb 14.4 oz (65.3 kg)     Height:        Intake/Output Summary (Last 24 hours) at 08/06/16 1741 Last data filed at 08/06/16 1618  Gross per 24 hour  Intake              702 ml  Output             1775 ml  Net            -1073 ml   Filed Weights   08/04/16 0605 08/05/16 0758 08/06/16 0652  Weight: 142 lb 3.2 oz (64.5 kg) 141 lb (64 kg) 143 lb 14.4 oz (65.3 kg)   Body mass index is 25.49 kg/m.   General:  Chronically ill appearing , elderly man  Head: Normocephalic, atraumatic, sclera non-icteric, no xanthomas, nares are without discharge. Neck: Negative for carotid bruits. JVD not elevated. Lungs: bilateral rhonchi,  No  significant rales.  Heart: Reg. Reg.  2/6 systolic murmur  Abdomen: Soft, non-tender, non-distended with normoactive bowel sounds. No hepatomegaly. No rebound/guarding. No obvious abdominal masses. Msk:  Strength and tone appear normal for age. Extremities: no C/C.   No significant edema.   Lower legs are tender .  Legs are very pale but are warm.  Neuro: Alert and oriented X 3. No facial asymmetry. No focal deficit. Moves all extremities spontaneously. Psych:  Responds to questions appropriately with a normal affect.  EKG:  The EKG was personally reviewed and demonstrates vpaced rhythm 61bpm   Relevant CV Studies: 2D echo 01/2016 Study Conclusions - Left ventricle: The cavity size was moderately dilated. Systolic   function was severely reduced. The estimated ejection fraction   was in the range of 10% to 15%. Diffuse hypokinesis. - Aortic valve: A bicuspid morphology cannot be excluded;   moderately thickened, moderately calcified leaflets. Valve   mobility was restricted. There was mild stenosis. There was no   regurgitation. Peak velocity (S): 220 cm/s. Valve area (VTI): 0.7   cm^2. Valve area (Vmax): 0.79 cm^2. Valve area (Vmean): 0.66   cm^2. - Mitral valve: Transvalvular velocity was within the normal range.   There was no evidence for stenosis. There was mild regurgitation. - Left atrium: The atrium was massively dilated. - Right ventricle: The cavity size was mildly dilated. Wall   thickness was normal. Systolic function was severely reduced. - Right atrium: The atrium was severely dilated. - Tricuspid valve: There was mild regurgitation. - Pulmonary arteries: Systolic pressure was within the normal   range. PA peak pressure: 18 mm Hg (S). Impressions: - Aortic stenosis is mild by mean gradient and peak velocity.   However, this is likely underestimated due to reduced systolic   function which can produce low flow, low gradient aortic   stenosis. Consider dobutamine  stress echo if clinically   indicated.   Laboratory Data:  Chemistry Recent Labs Lab 08/04/16 0337 08/05/16 0438 08/06/16 0526  NA 133* 134* 131*  K 3.9 2.9* 4.8  CL 96* 95* 93*  CO2 26 29 29   GLUCOSE 95 97 95  BUN 39* 27* 24*  CREATININE 1.45* 1.22 1.12  CALCIUM 8.9 8.4* 8.7*  GFRNONAA 43* 53* 59*  GFRAA 50* >60 >60  ANIONGAP 11 10 9      Recent Labs Lab 08/05/16 0438 08/06/16 0526  PROT 6.3* 7.0  ALBUMIN 2.7* 2.9*  AST 20 30  ALT 27 22  ALKPHOS 112 120  BILITOT 1.2 1.7*   Hematology Recent Labs Lab 08/03/16 1107 08/05/16 0438 08/06/16 0526  WBC 4.0 3.6* 3.8*  RBC 2.88* 2.51* 2.68*  HGB 8.8* 7.6* 8.1*  HCT 27.9* 24.7* 26.2*  MCV 96.9 98.4 97.8  MCH 30.6 30.3 30.2  MCHC 31.5 30.8 30.9  RDW 20.1* 20.4* 20.3*  PLT 127* 116* 123*   Cardiac Enzymes Recent Labs Lab 08/03/16 1835  TROPONINI 0.03*    Recent Labs Lab 08/03/16 1204  TROPIPOC 0.03    BNP Recent Labs Lab 08/03/16 1146  BNP 2,772.1*    DDimer No results for input(s): DDIMER in the last 168 hours.  Radiology/Studies:  Dg Chest 2 View  Result Date: 08/03/2016 CLINICAL DATA:  83 year old with bilateral lower extremity edema and shortness of breath which began 3 days ago. Current history of black lung disease. Personal history of prostate cancer and lung cancer. Oxygen dependent COPD. EXAM: CHEST  2 VIEW COMPARISON:  07/24/2016, 07/06/2016 and earlier, including CT chest 08/07/2013. FINDINGS: Sternotomy for CABG. Left subclavian biventricular pacing defibrillator unchanged and appears intact. Residual right subclavian ventricular lead unchanged. Cardiac silhouette moderately to markedly enlarged, unchanged. Thoracic aorta atherosclerotic, unchanged. Hilar and mediastinal contours otherwise unremarkable. Mild diffuse interstitial pulmonary edema and small bilateral pleural effusions, increased since the examination 10 days ago. No confluent airspace consolidation. Degenerative changes involving  the thoracic and upper lumbar spine. IMPRESSION: Mild CHF and small bilateral pleural effusions, worse than on the examination 10 days ago. Electronically Signed   By: Evangeline Dakin M.D.   On: 08/03/2016 12:19    Assessment and Plan:   1. Acute on chronic systolic CHF 2. AKI on CKD III 3. Chronic atrial fib  4. Acute on chronic anemia 5. Mild AS (possibly underestimated?) by echo 01/2016 6. Acute on chronic respiratory failure with underlying COPD 7. Prior GIB  Patient discussed/examined by Dr. Acie Fredrickson. See below for comprehensive thoughts.  Signed, Charlie Pitter, PA-C  08/06/2016 5:41 PM  Attending Note:   The patient was seen and examined.  Agree with assessment and plan as noted above.  Changes made to the above note as needed.  Patient seen and independently examined with Melina Copa, PA .   We discussed all aspects of the encounter. I agree with the assessment and plan as stated above.  Somewhat of a difficult historian.     1. Acute on chronic systolic CHF:   He has been diuresed 4.7 liters this admission.   He has no significant edema in legs.  Lungs have rhonchi but very few rales.  EF has been very low.   He has multiple medical problems  He has been seen by hospice and palliative care but "fired" hospice He continues to be a DNR   Continue lasix for now.  2. Chronic atrial fib:   Pacing . On Eliquis.   3. COPD / black lung disease.  Further plans per IM , continue supplemental O2.      I have spent a total of 40 minutes with patient reviewing hospital  notes ,  telemetry, EKGs, labs and examining patient as well as establishing an assessment and plan that was discussed with the patient. > 50% of time was spent in direct patient care.     Thayer Headings, Brooke Bonito., MD, [facility] 08/06/2016, 6:23 PM [address]  [place] Pager [phone]

## 2016-08-06 NOTE — Progress Notes (Signed)
[facility] PROGRESS NOTE  [name] QZR:[id number] DOB: 1933-01-21 DOA: 08/03/2016   PCP: [name], MD     Assessment/Plan: Principal Problem:   Acute on chronic respiratory failure ([place]) Active Problems:   Chronic a-fib ([place])   Pacemaker   Black lung disease ([place])   Anemia   Acute on chronic combined systolic and diastolic CHF (congestive heart failure) (HCC)   Chronic renal disease, stage III   DM2 (diabetes mellitus, type 2) (HCC)   COPD exacerbation ([place])   Palliative care by specialist    83 y.o. male  with past medical history of sCHF with EF 10-15%, ischemic cardiomyopathy with pacemaker placement, HTN, CAD s/p CABG, A. Fib, severe lung disease due to COPD and black lung disease, on home O2 2.5L, and DM2. This is his fourth admission since January, with his most recent stay 5/15-5/17 as a GIP admission for CHF. He had hospice through [facility] initiated in February at the recommendation of his Cardiologist, Dr. Percival Spanish. On 5/24 he formally stopped Hospice services, citing a desire for further treatment options. He then presented to the ED and was admitted on 08/03/2016 with CHF exacerbation. Palliative consulted to assist in clarifying goals of care.   Assessment and plan #1. Acute on chronic respiratory failure likely related to acute on chronic combined systolic and diastolic heart failure in the setting of COPD. Patient was discharged days ago for same. He reports compliance with his medications. Echo in November 2017 an EF of 15% diffus hypokinesis. BNP 2772. Initial troponin negative. EKG as noted above. Chest x-ray with mild CHF and small bilateral pleural effusions worse than on exam 10 days ago. He was provided Lasix 80 mg in the emergency department.  URINE OUTPUT NOT BEING CHARTED ACCURATELY, declining  Continue telemetry- paced rhythm -Lasix 60 mg IV TID  -Weight up by 9 pounds since discharge, now 354>562>563 -Wean oxygen to baseline is  able -Scheduled nebulizers  #2. Acute on chronic combined CHF. See #1. Chart review indicates at discharge 5 days ago his home Lasix was increased to 40 mg twice a day.  [name] He does not weigh himself daily -Home meds include Lasix, imdur, metoprolol -Continue home meds  Cardiology consult for HF   #3. Chronic A. fib on Eliquis and metoprolol. Rate controlled.Mali vasc score 5.  Monitor on telemetry Continue home meds  #4. COPD/black lung disease/chronic respiratory failure. He is on home oxygen at 2.5 L , 24/ 7. Home medications include albuterol inhaler and nebulizer -Continue oxygen supplementation -Oxygen to baseline as able -Scheduled nebulizers -See #1  #5. Chronic kidney disease stage III. Creatinine on admission 1.33. This appears to be close to baseline. -Monitor urine output -Hold nephrotoxins as able   6. Anemia. Chronic. Likely related to chronic illness. Hemoglobin 8.8 on admission, now 7.6. Chart review indicates this is close to his baseline. No signs symptoms of active bleeding. He is on elequis -monitor -FOBT   if drops further consider stopping eliquis ?   7. Hypokalemia  Replete     DVT prophylaxsis eliquis  Code Status:  DO NOT RESUSCITATE    Family Communication: Discussed in detail with the patient, all imaging results, lab results explained to the patient   Disposition Plan:   1-2 days      Consultants:  Cardiology  Procedures:  None  Antibiotics: Anti-infectives    None         HPI/Subjective:  Complaining of shortness of breath, no  chest pain  Objective: Vitals:   08/05/16 1919 08/05/16 2255 08/06/16 0652 08/06/16 0813  BP:  110/79    Pulse:  62    Resp:  18    Temp:  97.8 F (36.6 C)    TempSrc:  Oral    SpO2: 99% 97%  97%  Weight:   65.3 kg (143 lb 14.4 oz)   Height:        Intake/Output Summary (Last 24 hours) at 08/06/16 1204 Last data filed at 08/06/16 0931  Gross per 24 hour  Intake              240  ml  Output             1225 ml  Net             -985 ml    Exam:  Examination:  General exam: Appears calm and comfortable  Respiratory system: Clear to auscultation. Respiratory effort normal. Cardiovascular system: S1 & S2 heard, RRR. No JVD, murmurs, rubs, gallops or clicks. 2 + pedal edema. Gastrointestinal system: Abdomen is nondistended, soft and nontender. No organomegaly or masses felt. Normal bowel sounds heard. Central nervous system: Alert and oriented. No focal neurological deficits. Extremities: Symmetric 5 x 5 power. Skin: No rashes, lesions or ulcers Psychiatry: Judgement and insight appear normal. Mood & affect appropriate.     Data Reviewed: I have personally reviewed following labs and imaging studies  Micro Results No results found for this or any previous visit (from the past 240 hour(s)).  Radiology Reports Dg Chest 2 View  Result Date: 08/03/2016 CLINICAL DATA:  83 year old with bilateral lower extremity edema and shortness of breath which began 3 days ago. Current history of black lung disease. Personal history of prostate cancer and lung cancer. Oxygen dependent COPD. EXAM: CHEST  2 VIEW COMPARISON:  07/24/2016, 07/06/2016 and earlier, including CT chest 08/07/2013. FINDINGS: Sternotomy for CABG. Left subclavian biventricular pacing defibrillator unchanged and appears intact. Residual right subclavian ventricular lead unchanged. Cardiac silhouette moderately to markedly enlarged, unchanged. Thoracic aorta atherosclerotic, unchanged. Hilar and mediastinal contours otherwise unremarkable. Mild diffuse interstitial pulmonary edema and small bilateral pleural effusions, increased since the examination 10 days ago. No confluent airspace consolidation. Degenerative changes involving the thoracic and upper lumbar spine. IMPRESSION: Mild CHF and small bilateral pleural effusions, worse than on the examination 10 days ago. Electronically Signed   By: Evangeline Dakin M.D.    On: 08/03/2016 12:19   Dg Chest 2 View  Result Date: 07/24/2016 CLINICAL DATA:  Shortness of breath. EXAM: CHEST  2 VIEW COMPARISON:  July 06, 2016 FINDINGS: No pneumothorax. Cardiomegaly. Bilateral pulmonary opacities could represent developing infiltrate or edema. AICD device is identified. No other acute abnormalities are seen. IMPRESSION: Cardiomegaly. Bilateral pulmonary opacities. The pulmonary opacities are not specific based on imaging. History of shortness of breath with leg swelling and no cough suggests edema. Developing infiltrate not excluded on this study. Recommend clinical correlation and follow-up to resolution. Electronically Signed   By: Dorise Bullion III M.D   On: 07/24/2016 17:33     CBC  Recent Labs Lab 08/03/16 1107 08/05/16 0438 08/06/16 0526  WBC 4.0 3.6* 3.8*  HGB 8.8* 7.6* 8.1*  HCT 27.9* 24.7* 26.2*  PLT 127* 116* 123*  MCV 96.9 98.4 97.8  MCH 30.6 30.3 30.2  MCHC 31.5 30.8 30.9  RDW 20.1* 20.4* 20.3*    Chemistries   Recent Labs Lab 08/03/16 1107 08/04/16 3086 08/05/16 5784 08/06/16 6962  NA 131* 133* 134* 131*  K 4.6 3.9 2.9* 4.8  CL 95* 96* 95* 93*  CO2 22 26 29 29   GLUCOSE 95 95 97 95  BUN 38* 39* 27* 24*  CREATININE 1.33* 1.45* 1.22 1.12  CALCIUM 9.1 8.9 8.4* 8.7*  AST  --   --  20 30  ALT  --   --  27 22  ALKPHOS  --   --  112 120  BILITOT  --   --  1.2 1.7*   ------------------------------------------------------------------------------------------------------------------ estimated creatinine clearance is 40.9 mL/min (by C-G formula based on SCr of 1.12 mg/dL). ------------------------------------------------------------------------------------------------------------------ No results for input(s): HGBA1C in the last 72 hours. ------------------------------------------------------------------------------------------------------------------ No results for input(s): CHOL, HDL, LDLCALC, TRIG, CHOLHDL, LDLDIRECT in the last 72  hours. ------------------------------------------------------------------------------------------------------------------ No results for input(s): TSH, T4TOTAL, T3FREE, THYROIDAB in the last 72 hours.  Invalid input(s): FREET3 ------------------------------------------------------------------------------------------------------------------ No results for input(s): VITAMINB12, FOLATE, FERRITIN, TIBC, IRON, RETICCTPCT in the last 72 hours.  Coagulation profile No results for input(s): INR, PROTIME in the last 168 hours.  No results for input(s): DDIMER in the last 72 hours.  Cardiac Enzymes  Recent Labs Lab 08/03/16 1835  TROPONINI 0.03*   ------------------------------------------------------------------------------------------------------------------ Invalid input(s): POCBNP   CBG: No results for input(s): GLUCAP in the last 168 hours.     Studies: No results found.    Lab Results  Component Value Date   HGBA1C 5.4 10/27/2015   HGBA1C 5.6 05/14/2011   HGBA1C 5.6 02/10/2011   Lab Results  Component Value Date   LDLCALC 92 10/27/2015   CREATININE 1.12 08/06/2016       Scheduled Meds: . albuterol  2.5 mg Nebulization TID  . allopurinol  300 mg Oral Daily  . apixaban  2.5 mg Oral BID  . furosemide  60 mg Intravenous Q8H  . isosorbide mononitrate  30 mg Oral Daily  . metoprolol succinate  25 mg Oral Daily  . pantoprazole  40 mg Oral Daily  . sodium chloride flush  3 mL Intravenous Q12H  . vitamin B-12  1,000 mcg Oral BID   Continuous Infusions: . sodium chloride       LOS: 3 days    Time spent: >30 MINS    Reyne Dumas  [facility] Pager [phone]. If 7PM-7AM, please contact night-coverage at [email or web address], password [facility] 08/06/2016, 12:04 PM  LOS: 3 days

## 2016-08-07 MED ORDER — POTASSIUM CHLORIDE CRYS ER 20 MEQ PO TBCR
40.0000 meq | EXTENDED_RELEASE_TABLET | Freq: Once | ORAL | Status: AC
  Administered 2016-08-07: 40 meq via ORAL
  Filled 2016-08-07: qty 2

## 2016-08-07 MED ORDER — METOLAZONE 2.5 MG PO TABS
2.5000 mg | ORAL_TABLET | Freq: Once | ORAL | Status: AC
  Administered 2016-08-07: 2.5 mg via ORAL
  Filled 2016-08-07: qty 1

## 2016-08-07 NOTE — Progress Notes (Addendum)
Physical Therapy Treatment Patient Details Name: [name] MRN: [id number] DOB: 18-Nov-1932 Today's Date: 08/07/2016    History of Present Illness Pt is an 83 y.o. male who presented to the ED with shortness of breath and lower extremity edema with MD evaluation recealing acute on chronic respiratory failure likely related to acute on chronic combined systolic diastolic heart failure. Pt was recently discharged for the same 5 days ago. He has a PMH significant for but not limited to black lung disease, CAD, carotid artery occlusion, chronic atrial gibrillation, chronic respiratory failure, COPD, CVA, DM, GERD, gout, hypercholesterolemia, hypertension, and ischemic cardiomyopathy.     PT Comments    Pt tolerated increased distance with ambulation, performed seated BLE exercises. Pt fatigues quickly, requires verbal cues for pursed lip breathing.   Follow Up Recommendations  Home health PT;Supervision - Intermittent     Equipment Recommendations  None recommended by PT    Recommendations for Other Services       Precautions / Restrictions Precautions Precautions: Fall;ICD/Pacemaker Precaution Comments: h/o 2 falls in past year Restrictions Weight Bearing Restrictions: No    Mobility  Bed Mobility               General bed mobility comments: Up in chair upon PT arrival.  Transfers Overall transfer level: Needs assistance Equipment used: Rolling walker (2 wheeled) Transfers: Sit to/from Stand Sit to Stand: Supervision         General transfer comment: VCs hand placmement, mildly uncontrolled descent to recliner following ambulation  Ambulation/Gait Ambulation/Gait assistance: Min guard Ambulation Distance (Feet): 180 Feet Assistive device: Rolling walker (2 wheeled) Gait Pattern/deviations: Step-through pattern;Decreased stride length;Trunk flexed Gait velocity: decreased Gait velocity interpretation: at or above normal speed for age/gender General Gait  Details: steady with RW, VCs for positioning in RW and posture, 2/4 dyspnea, ambulated with 3L O2 (pt uses 2.5 L at home), pulse ox would not give reading during ambulation, VCs for pursed lip breathing   Stairs            Wheelchair Mobility    Modified Rankin (Stroke Patients Only)       Balance Overall balance assessment: Needs assistance Sitting-balance support: No upper extremity supported;Feet supported Sitting balance-Leahy Scale: Fair     Standing balance support: During functional activity Standing balance-Leahy Scale: Fair Standing balance comment: Able to stand statically without UE support but requires UE support for dynamic standing.                            Cognition Arousal/Alertness: Awake/alert Behavior During Therapy: WFL for tasks assessed/performed Overall Cognitive Status: Within Functional Limits for tasks assessed                                        Exercises General Exercises - Lower Extremity Long Arc Quad: AROM;Both;10 reps;Seated (2 sets of 5 with brief rest 2* SOB after 5 reps) Hip Flexion/Marching: AROM;Both;10 reps;Seated Shoulder Exercises Shoulder Flexion: AROM;Right;10 reps;Seated (painful L shoulder at baseline (pt reports "spurs") so did not attempt L shoulder flexion)    General Comments        Pertinent Vitals/Pain Pain Assessment: No/denies pain    Home Living                      Prior Function  PT Goals (current goals can now be found in the care plan section) Acute Rehab PT Goals Patient Stated Goal: to have less leg pain PT Goal Formulation: With patient Time For Goal Achievement: 08/20/16 Potential to Achieve Goals: Good Progress towards PT goals: Progressing toward goals    Frequency    Min 3X/week      PT Plan Current plan remains appropriate    Co-evaluation              AM-PAC PT "6 Clicks" Daily Activity  Outcome Measure  Difficulty  turning over in bed (including adjusting bedclothes, sheets and blankets)?: None Difficulty moving from lying on back to sitting on the side of the bed? : None Difficulty sitting down on and standing up from a chair with arms (e.g., wheelchair, bedside commode, etc,.)?: None Help needed moving to and from a bed to chair (including a wheelchair)?: A Little Help needed walking in hospital room?: A Little Help needed climbing 3-5 steps with a railing? : A Lot 6 Click Score: 20    End of Session Equipment Utilized During Treatment: Gait belt;Oxygen Activity Tolerance: Patient tolerated treatment well Patient left: in chair;with call bell/phone within reach Nurse Communication: Mobility status PT Visit Diagnosis: Unsteadiness on feet (R26.81);History of falling (Z91.81) Pain - Right/Left:  (both)     Time: 3244-0102 PT Time Calculation (min) (ACUTE ONLY): 20 min  Charges:  $Gait Training: 8-22 mins                    G Codes:          Philomena Doheny 08/07/2016, 11:10 AM [phone]

## 2016-08-07 NOTE — Progress Notes (Signed)
Occupational Therapy Treatment Patient Details Name: [name] MRN: [id number] DOB: 06-29-1932 Today's Date: 08/07/2016    History of present illness Pt is an 83 y.o. male who presented to the ED with shortness of breath and lower extremity edema with MD evaluation recealing acute on chronic respiratory failure likely related to acute on chronic combined systolic diastolic heart failure. Pt was recently discharged for the same 5 days ago. He has a PMH significant for but not limited to black lung disease, CAD, carotid artery occlusion, chronic atrial gibrillation, chronic respiratory failure, COPD, CVA, DM, GERD, gout, hypercholesterolemia, hypertension, and ischemic cardiomyopathy.    OT comments  Pt with decreased activity tolerance, but demonstrated ability to groom and toilet in standing with supervision for safety. Pt reports staying in the same pair of pajamas for a week at home or until he has to go to a MD appointment. He primarily stays in his hospital bed, but is able to retrieve his own lunch meal. Pt educated in energy conservation. Changed recommendation to home.   Follow Up Recommendations  No OT follow up    Equipment Recommendations       Recommendations for Other Services      Precautions / Restrictions Precautions Precautions: Fall;ICD/Pacemaker Precaution Comments: h/o 2 falls in past year Restrictions Weight Bearing Restrictions: No       Mobility Bed Mobility               General bed mobility comments: Up in chair upon OT arrival.  Transfers Overall transfer level: Needs assistance Equipment used: Rolling walker (2 wheeled) Transfers: Sit to/from Stand Sit to Stand: Supervision         General transfer comment: cues for hand placement    Balance Overall balance assessment: Needs assistance Sitting-balance support: No upper extremity supported;Feet supported Sitting balance-Leahy Scale: Fair     Standing balance support: During  functional activity Standing balance-Leahy Scale: Fair Standing balance comment: Able to stand statically without UE support but requires UE support for dynamic standing.                           ADL either performed or assessed with clinical judgement   ADL Overall ADL's : Needs assistance/impaired     Grooming: Supervision/safety;Standing;Wash/dry Geophysical data processor Transfer: Min guard;Ambulation;RW Toilet Transfer Details (indicate cue type and reason): stood to urinate Toileting- Clothing Manipulation and Hygiene: Minimal assistance         General ADL Comments: Pt educated in energy conservation, not all that receptive.     Vision   Vision Assessment?: No apparent visual deficits   Perception     Praxis      Cognition Arousal/Alertness: Awake/alert Behavior During Therapy: WFL for tasks assessed/performed Overall Cognitive Status: Within Functional Limits for tasks assessed                                          Exercises    Shoulder Instructions       General Comments      Pertinent Vitals/ Pain       Pain Assessment: Faces Faces Pain Scale: Hurts a little bit Pain Location: L shoulder Pain Descriptors / Indicators: Aching;Sore Pain Intervention(s): Monitored during session;Repositioned  Home Living  Prior Functioning/Environment              Frequency  Min 2X/week        Progress Toward Goals  OT Goals(current goals can now be found in the care plan section)  Progress towards OT goals: Progressing toward goals  Acute Rehab OT Goals Patient Stated Goal: to stay in the hospital OT Goal Formulation: With patient Time For Goal Achievement: 08/19/16 Potential to Achieve Goals: Good  Plan Discharge plan needs to be updated    Co-evaluation                 AM-PAC PT "6 Clicks" Daily Activity     Outcome Measure   Help  from another person eating meals?: None Help from another person taking care of personal grooming?: A Little Help from another person toileting, which includes using toliet, bedpan, or urinal?: A Little Help from another person bathing (including washing, rinsing, drying)?: A Little Help from another person to put on and taking off regular upper body clothing?: None Help from another person to put on and taking off regular lower body clothing?: A Little 6 Click Score: 20    End of Session    OT Visit Diagnosis: Unsteadiness on feet (R26.81);Muscle weakness (generalized) (M62.81)   Activity Tolerance Patient limited by fatigue   Patient Left in chair;with call bell/phone within reach;with chair alarm set   Nurse Communication          Time: 6184-8592 OT Time Calculation (min): 21 min  Charges: OT General Charges $OT Visit: 1 Procedure OT Treatments $Self Care/Home Management : 8-22 mins     Malka So 08/07/2016, 11:29 AM  [phone]

## 2016-08-07 NOTE — Progress Notes (Signed)
Progress Note  Patient Name: [name] Date of Encounter: 08/07/2016  Primary Cardiologist: Hochrein   Subjective   83 y.o. male with a hx of Chronic systolic CHF (last EF 97-35%), h/o CRT-D (with [employer] gen change 2015 to PACEMAKER ONLY), CAD s/p CABG 2004 chronic atrial fib, CKD III, hypotension, carotid artery disease, chronic respiratory failure on home O2, CVA, DM, gout, GERD, HTN, HLD, lung CA, prostate CA, bradycardia, possible GIB 11/6-11/12/2015 (xarelto held then restarted at DC), admit for LE ischemia s/p thombectomy 11/10-11/16/2017 (Xarelto changed to Eliquis), hyponatremia whom we are asked to see for CHF by Dr. Allyson Sabal  I/O are -6.6 liters so far this admission   Inpatient Medications    Scheduled Meds: . allopurinol  300 mg Oral Daily  . apixaban  2.5 mg Oral BID  . furosemide  60 mg Intravenous Q8H  . isosorbide mononitrate  30 mg Oral Daily  . metoprolol succinate  25 mg Oral Daily  . pantoprazole  40 mg Oral Daily  . sodium chloride flush  3 mL Intravenous Q12H  . vitamin B-12  1,000 mcg Oral BID   Continuous Infusions: . sodium chloride     PRN Meds: sodium chloride, acetaminophen, albuterol, ALPRAZolam, nitroGLYCERIN, ondansetron (ZOFRAN) IV, ondansetron, sodium chloride flush   Vital Signs    Vitals:   08/06/16 2311 08/07/16 0700 08/07/16 0738 08/07/16 0902  BP: 105/64  (!) 100/49 109/61  Pulse: 63  62 62  Resp: 18  18   Temp: 98.9 F (37.2 C)  97.7 F (36.5 C)   TempSrc:   Oral   SpO2: 98%  94% 94%  Weight:  143 lb 11.8 oz (65.2 kg)    Height:        Intake/Output Summary (Last 24 hours) at 08/07/16 1407 Last data filed at 08/07/16 1346  Gross per 24 hour  Intake              682 ml  Output             2550 ml  Net            -1868 ml   Filed Weights   08/05/16 0758 08/06/16 0652 08/07/16 0700  Weight: 141 lb (64 kg) 143 lb 14.4 oz (65.3 kg) 143 lb 11.8 oz (65.2 kg)    Telemetry    V pacing  - Personally Reviewed  ECG      V pacing  - Personally Reviewed  Physical Exam   GEN: chronically ill appearing elderly man.   Neck: No JVD Cardiac: RRR, 3-2/9 systolic murmur  Respiratory:  bilateral rales  GI: Soft, nontender, non-distended  MS: trace  edema; No deformity. Neuro:  Nonfocal ,  Difficult to assess  Psych:  Seems to be normal   Labs    Chemistry Recent Labs Lab 08/04/16 0337 08/05/16 0438 08/06/16 0526  NA 133* 134* 131*  K 3.9 2.9* 4.8  CL 96* 95* 93*  CO2 26 29 29   GLUCOSE 95 97 95  BUN 39* 27* 24*  CREATININE 1.45* 1.22 1.12  CALCIUM 8.9 8.4* 8.7*  PROT  --  6.3* 7.0  ALBUMIN  --  2.7* 2.9*  AST  --  20 30  ALT  --  27 22  ALKPHOS  --  112 120  BILITOT  --  1.2 1.7*  GFRNONAA 43* 53* 59*  GFRAA 50* >60 >60  ANIONGAP 11 10 9      Hematology Recent Labs Lab 08/03/16 1107 08/05/16  8676 08/06/16 0526  WBC 4.0 3.6* 3.8*  RBC 2.88* 2.51* 2.68*  HGB 8.8* 7.6* 8.1*  HCT 27.9* 24.7* 26.2*  MCV 96.9 98.4 97.8  MCH 30.6 30.3 30.2  MCHC 31.5 30.8 30.9  RDW 20.1* 20.4* 20.3*  PLT 127* 116* 123*    Cardiac Enzymes Recent Labs Lab 08/03/16 1835  TROPONINI 0.03*    Recent Labs Lab 08/03/16 1204  TROPIPOC 0.03     BNP Recent Labs Lab 08/03/16 1146  BNP 2,772.1*     DDimer No results for input(s): DDIMER in the last 168 hours.   Radiology    No results found.  Cardiac Studies      Patient Profile     83 y.o. male  With severe LV dysfunction.  Assessment & Plan    1. Acute on chronic systolic CHF: Has now diuresed 6.6 liters so far this admission. .   Has rales in lungs.  Continue IV lasix No new recs.   2. Chronic Afib.  Has a pacer.  On eliquis   3. COPD - plans per IM    Signed, Mertie Moores, MD  08/07/2016, 2:07 PM

## 2016-08-07 NOTE — Progress Notes (Signed)
Zoe, NT walked the patient on oxygen. She said he started out at 87% but then it went up to 93% before walking and stayed at 93-94% on 2L/[place] while ambulating.

## 2016-08-07 NOTE — Progress Notes (Signed)
[facility] PROGRESS NOTE  [name] FTD:[id number] DOB: 1932-03-17 DOA: 08/03/2016   PCP: [name], MD     Assessment/Plan: Principal Problem:   Acute on chronic respiratory failure ([place]) Active Problems:   Chronic a-fib ([place])   Pacemaker   Black lung disease ([place])   Anemia   Acute on chronic combined systolic and diastolic CHF (congestive heart failure) (HCC)   Chronic renal disease, stage III   DM2 (diabetes mellitus, type 2) (HCC)   COPD exacerbation ([place])   Palliative care by specialist    83 y.o. male  with past medical history of sCHF with EF 10-15%, ischemic cardiomyopathy with pacemaker placement, HTN, CAD s/p CABG, A. Fib, severe lung disease due to COPD and black lung disease, on home O2 2.5L, and DM2. This is his fourth admission since January, with his most recent stay 5/15-5/17 as a GIP admission for CHF. He had hospice through [facility] initiated in February at the recommendation of his Cardiologist, Dr. Percival Spanish. On 5/24 he formally stopped Hospice services, citing a desire for further treatment options. He then presented to the ED and was admitted on 08/03/2016 with CHF exacerbation. Palliative consulted to assist in clarifying goals of care. Patient refuses to be discharged home  Assessment and plan #1. Acute on chronic respiratory failure likely related to acute on chronic combined systolic and diastolic heart failure in the setting of COPD. Patient was discharged days ago for same. He reports compliance with his medications. Echo in November 2017 an EF of 15% diffus hypokinesis. BNP 2772. Initial troponin negative. EKG as noted above. Chest x-ray with mild CHF and small bilateral pleural effusions worse than on exam 10 days ago. He was provided Lasix 80 mg in the emergency department.  Continue telemetry- paced rhythm -Lasix 60 mg IV TID , urine output 2950 in the last 24 hours -Weight up by 9 pounds since discharge, now 142>141>143.143 -Wean oxygen to  baseline is able -Scheduled nebulizers  #2. Acute on chronic combined CHF. See #1. Chart review indicates at discharge 5 days ago his home Lasix was increased to 40 mg twice a day.  [name] He does not weigh himself daily -Home meds include Lasix, imdur, metoprolol Cardiology consulted for assistance with heart failure Will give  low-dose Zaroxolyn, closely monitor potassium and sodium  #3. Chronic A. fib on Eliquis and metoprolol. Rate controlled.Mali vasc score 5.  Monitor on telemetry, paced rhythm    #4. COPD/black lung disease/chronic respiratory failure. He is on home oxygen at 2.5 L , 24/ 7. Home medications include albuterol inhaler and nebulizer -Continue oxygen supplementation -Oxygen to baseline as able Check ambulatory pulse oximetry on 2-1/2 L  #5. Chronic kidney disease stage III. Creatinine on admission 1.33. This appears to be close to baseline. -Monitor urine output Renal function stable in the setting of diuresis   6. Anemia. Chronic. Likely related to chronic illness. Hemoglobin 8.8 on admission, now 7.6. Chart review indicates this is close to his baseline. No signs symptoms of active bleeding. He is on elequis -monitor -FOBT   if drops further consider stopping eliquis ? Follow CBC  7. Hypokalemia  Repleted     DVT prophylaxsis eliquis  Code Status:  DO NOT RESUSCITATE    Family Communication: Discussed in detail with the patient, all imaging results, lab results explained to the patient   Disposition Plan:   Disposition as per cardiology, patient refuses to be discharged home "what's the point I will  come back"      Consultants:  Cardiology  Procedures:  None  Antibiotics: Anti-infectives    None         HPI/Subjective:  Feels shortness of breath is improved today  Objective: Vitals:   08/06/16 1930 08/06/16 2311 08/07/16 0700 08/07/16 0738  BP:  105/64  (!) 100/49  Pulse:  63  62  Resp:  18  18  Temp:  98.9 F (37.2 C)   97.7 F (36.5 C)  TempSrc:    Oral  SpO2: 99% 98%  94%  Weight:   65.2 kg (143 lb 11.8 oz)   Height:        Intake/Output Summary (Last 24 hours) at 08/07/16 0809 Last data filed at 08/07/16 0644  Gross per 24 hour  Intake              942 ml  Output             2550 ml  Net            -1608 ml    Exam:  Examination:  General exam: Appears calm and comfortable  Respiratory system: Clear to auscultation. Respiratory effort normal. Cardiovascular system: S1 & S2 heard, RRR. No JVD, murmurs, rubs, gallops or clicks. 2 + pedal edema. Gastrointestinal system: Abdomen is nondistended, soft and nontender. No organomegaly or masses felt. Normal bowel sounds heard. Central nervous system: Alert and oriented. No focal neurological deficits. Extremities: Symmetric 5 x 5 power. Skin: No rashes, lesions or ulcers Psychiatry: Judgement and insight appear normal. Mood & affect appropriate.     Data Reviewed: I have personally reviewed following labs and imaging studies  Micro Results No results found for this or any previous visit (from the past 240 hour(s)).  Radiology Reports Dg Chest 2 View  Result Date: 08/03/2016 CLINICAL DATA:  83 year old with bilateral lower extremity edema and shortness of breath which began 3 days ago. Current history of black lung disease. Personal history of prostate cancer and lung cancer. Oxygen dependent COPD. EXAM: CHEST  2 VIEW COMPARISON:  07/24/2016, 07/06/2016 and earlier, including CT chest 08/07/2013. FINDINGS: Sternotomy for CABG. Left subclavian biventricular pacing defibrillator unchanged and appears intact. Residual right subclavian ventricular lead unchanged. Cardiac silhouette moderately to markedly enlarged, unchanged. Thoracic aorta atherosclerotic, unchanged. Hilar and mediastinal contours otherwise unremarkable. Mild diffuse interstitial pulmonary edema and small bilateral pleural effusions, increased since the examination 10 days ago. No  confluent airspace consolidation. Degenerative changes involving the thoracic and upper lumbar spine. IMPRESSION: Mild CHF and small bilateral pleural effusions, worse than on the examination 10 days ago. Electronically Signed   By: Evangeline Dakin M.D.   On: 08/03/2016 12:19   Dg Chest 2 View  Result Date: 07/24/2016 CLINICAL DATA:  Shortness of breath. EXAM: CHEST  2 VIEW COMPARISON:  July 06, 2016 FINDINGS: No pneumothorax. Cardiomegaly. Bilateral pulmonary opacities could represent developing infiltrate or edema. AICD device is identified. No other acute abnormalities are seen. IMPRESSION: Cardiomegaly. Bilateral pulmonary opacities. The pulmonary opacities are not specific based on imaging. History of shortness of breath with leg swelling and no cough suggests edema. Developing infiltrate not excluded on this study. Recommend clinical correlation and follow-up to resolution. Electronically Signed   By: Dorise Bullion III M.D   On: 07/24/2016 17:33     CBC  Recent Labs Lab 08/03/16 1107 08/05/16 0438 08/06/16 0526  WBC 4.0 3.6* 3.8*  HGB 8.8* 7.6* 8.1*  HCT 27.9* 24.7* 26.2*  PLT 127* 116*  123*  MCV 96.9 98.4 97.8  MCH 30.6 30.3 30.2  MCHC 31.5 30.8 30.9  RDW 20.1* 20.4* 20.3*    Chemistries   Recent Labs Lab 08/03/16 1107 08/04/16 0337 08/05/16 0438 08/06/16 0526  NA 131* 133* 134* 131*  K 4.6 3.9 2.9* 4.8  CL 95* 96* 95* 93*  CO2 22 26 29 29   GLUCOSE 95 95 97 95  BUN 38* 39* 27* 24*  CREATININE 1.33* 1.45* 1.22 1.12  CALCIUM 9.1 8.9 8.4* 8.7*  AST  --   --  20 30  ALT  --   --  27 22  ALKPHOS  --   --  112 120  BILITOT  --   --  1.2 1.7*   ------------------------------------------------------------------------------------------------------------------ estimated creatinine clearance is 40.9 mL/min (by C-G formula based on SCr of 1.12 mg/dL). ------------------------------------------------------------------------------------------------------------------ No  results for input(s): HGBA1C in the last 72 hours. ------------------------------------------------------------------------------------------------------------------ No results for input(s): CHOL, HDL, LDLCALC, TRIG, CHOLHDL, LDLDIRECT in the last 72 hours. ------------------------------------------------------------------------------------------------------------------ No results for input(s): TSH, T4TOTAL, T3FREE, THYROIDAB in the last 72 hours.  Invalid input(s): FREET3 ------------------------------------------------------------------------------------------------------------------ No results for input(s): VITAMINB12, FOLATE, FERRITIN, TIBC, IRON, RETICCTPCT in the last 72 hours.  Coagulation profile No results for input(s): INR, PROTIME in the last 168 hours.  No results for input(s): DDIMER in the last 72 hours.  Cardiac Enzymes  Recent Labs Lab 08/03/16 1835  TROPONINI 0.03*   ------------------------------------------------------------------------------------------------------------------ Invalid input(s): POCBNP   CBG: No results for input(s): GLUCAP in the last 168 hours.     Studies: No results found.    Lab Results  Component Value Date   HGBA1C 5.4 10/27/2015   HGBA1C 5.6 05/14/2011   HGBA1C 5.6 02/10/2011   Lab Results  Component Value Date   LDLCALC 92 10/27/2015   CREATININE 1.12 08/06/2016       Scheduled Meds: . allopurinol  300 mg Oral Daily  . apixaban  2.5 mg Oral BID  . furosemide  60 mg Intravenous Q8H  . isosorbide mononitrate  30 mg Oral Daily  . metolazone  2.5 mg Oral Once  . metoprolol succinate  25 mg Oral Daily  . pantoprazole  40 mg Oral Daily  . potassium chloride  40 mEq Oral Once  . sodium chloride flush  3 mL Intravenous Q12H  . vitamin B-12  1,000 mcg Oral BID   Continuous Infusions: . sodium chloride       LOS: 4 days    Time spent: >30 MINS    Reyne Dumas  [facility] Pager [phone]. If 7PM-7AM,  please contact night-coverage at [email or web address], password [facility] 08/07/2016, 8:09 AM  LOS: 4 days

## 2016-08-08 DIAGNOSIS — E118 Type 2 diabetes mellitus with unspecified complications: Secondary | ICD-10-CM

## 2016-08-08 DIAGNOSIS — J9620 Acute and chronic respiratory failure, unspecified whether with hypoxia or hypercapnia: Secondary | ICD-10-CM

## 2016-08-08 DIAGNOSIS — J60 Coalworker's pneumoconiosis: Secondary | ICD-10-CM

## 2016-08-08 DIAGNOSIS — D500 Iron deficiency anemia secondary to blood loss (chronic): Secondary | ICD-10-CM

## 2016-08-08 LAB — COMPREHENSIVE METABOLIC PANEL
ALT: 22 U/L (ref 17–63)
ANION GAP: 10 (ref 5–15)
AST: 21 U/L (ref 15–41)
Albumin: 2.8 g/dL — ABNORMAL LOW (ref 3.5–5.0)
Alkaline Phosphatase: 138 U/L — ABNORMAL HIGH (ref 38–126)
BUN: 24 mg/dL — ABNORMAL HIGH (ref 6–20)
CHLORIDE: 89 mmol/L — AB (ref 101–111)
CO2: 33 mmol/L — AB (ref 22–32)
Calcium: 8.9 mg/dL (ref 8.9–10.3)
Creatinine, Ser: 1.19 mg/dL (ref 0.61–1.24)
GFR calc non Af Amer: 55 mL/min — ABNORMAL LOW (ref >60)
Glucose, Bld: 114 mg/dL — ABNORMAL HIGH (ref 65–99)
Potassium: 3 mmol/L — ABNORMAL LOW (ref 3.5–5.1)
SODIUM: 132 mmol/L — AB (ref 135–145)
Total Bilirubin: 1 mg/dL (ref 0.3–1.2)
Total Protein: 7.1 g/dL (ref 6.5–8.1)

## 2016-08-08 LAB — CBC
HCT: 26.7 % — ABNORMAL LOW (ref 39.0–52.0)
HEMOGLOBIN: 8.5 g/dL — AB (ref 13.0–17.0)
MCH: 31.4 pg (ref 26.0–34.0)
MCHC: 31.8 g/dL (ref 30.0–36.0)
MCV: 98.5 fL (ref 78.0–100.0)
Platelets: 149 10*3/uL — ABNORMAL LOW (ref 150–400)
RBC: 2.71 MIL/uL — ABNORMAL LOW (ref 4.22–5.81)
RDW: 20.7 % — ABNORMAL HIGH (ref 11.5–15.5)
WBC: 4.5 10*3/uL (ref 4.0–10.5)

## 2016-08-08 MED ORDER — POTASSIUM CHLORIDE CRYS ER 20 MEQ PO TBCR: 40.0000 meq | EXTENDED_RELEASE_TABLET | Freq: Two times a day (BID) | ORAL | Status: DC

## 2016-08-08 MED ORDER — POTASSIUM CHLORIDE CRYS ER 20 MEQ PO TBCR
40.0000 meq | EXTENDED_RELEASE_TABLET | Freq: Two times a day (BID) | ORAL | Status: DC
Start: 2016-08-08 — End: 2016-08-08
  Administered 2016-08-08: 40 meq via ORAL
  Filled 2016-08-08: qty 2

## 2016-08-08 MED ORDER — POTASSIUM CHLORIDE CRYS ER 20 MEQ PO TBCR
40.0000 meq | EXTENDED_RELEASE_TABLET | Freq: Two times a day (BID) | ORAL | Status: DC
  Administered 2016-08-08 – 2016-08-09 (×2): 40 meq via ORAL
  Filled 2016-08-08 (×2): qty 2

## 2016-08-08 NOTE — Progress Notes (Signed)
[facility] PROGRESS NOTE  [name] DSK:[id number] DOB: 1932/12/31 DOA: 08/03/2016   PCP: [name], MD     Assessment/Plan: Principal Problem:   Acute on chronic respiratory failure ([place]) Active Problems:   Chronic a-fib ([place])   Pacemaker   Black lung disease ([place])   Anemia   Acute on chronic combined systolic and diastolic CHF (congestive heart failure) (HCC)   Chronic renal disease, stage III   DM2 (diabetes mellitus, type 2) (HCC)   COPD exacerbation ([place])   Palliative care by specialist    83 y.o. male  with past medical history of sCHF with EF 10-15%, ischemic cardiomyopathy with pacemaker placement, HTN, CAD s/p CABG, A. Fib, severe lung disease due to COPD and black lung disease, on home O2 2.5L, and DM2. This is his fourth admission since January, with his most recent stay 5/15-5/17 as a GIP admission for CHF. He had hospice through [facility] initiated in February at the recommendation of his Cardiologist, Dr. Percival Spanish. On 5/24 he formally stopped Hospice services, citing a desire for further treatment options. He then presented to the ED and was admitted on 08/03/2016 with CHF exacerbation. Palliative consulted to assist in clarifying goals of care. Patient refuses to be discharged home  Assessment and plan #1. Acute on chronic respiratory failure likely related to acute on chronic combined systolic and diastolic heart failure in the setting of COPD. Patient was discharged days ago for same. He reports compliance with his medications. Echo in November 2017 an EF of 15% diffus hypokinesis. BNP 2772. Initial troponin negative. EKG as noted above. Chest x-ray with mild CHF and small bilateral pleural effusions worse than on exam 10 days ago. He was provided Lasix 80 mg in the emergency department.  Continue telemetry- paced rhythm -Lasix 60 mg IV TID ,  Diuresed up to 10 liters since admission.  - replete potassium while on high dose of lasix.  -Wean oxygen to  baseline is able -Scheduled nebulizers  #2. Acute on chronic combined CHF. See #1. Chart review indicates at discharge 5 days ago his home Lasix was increased to 40 mg twice a day.  [name] He does not weigh himself daily -Home meds include Lasix, imdur, metoprolol Cardiology consulted for assistance with heart failure   #3. Chronic A. fib on Eliquis and metoprolol. Rate controlled.Mali vasc score 5.  Monitor on telemetry, paced rhythm    #4. COPD/black lung disease/chronic respiratory failure. He is on home oxygen at 2.5 L , 24/ 7. Home medications include albuterol inhaler and nebulizer -Continue oxygen supplementation -Oxygen to baseline as able Check ambulatory pulse oximetry on 2-1/2 L  #5. Chronic kidney disease stage III. Creatinine on admission 1.33. This appears to be close to baseline. -Monitor urine output Renal function stable in the setting of diuresis   6. Anemia. Chronic. Likely related to chronic illness hemoglobin stable around 8.  No signs symptoms of active bleeding. He is on elequis. Monitor prn.    7. Hypokalemia  Repleted     DVT prophylaxsis eliquis  Code Status:  DO NOT RESUSCITATE    Family Communication: Discussed in detail with the patient, all imaging results, lab results explained to the patient   Disposition Plan:   Disposition as per cardiology,.      Consultants:  Cardiology  Procedures:  None  Antibiotics: Anti-infectives    None         HPI/Subjective:  Reports feeling better today.    Objective:  Vitals:   08/07/16 2251 08/08/16 0452 08/08/16 1014 08/08/16 1447  BP: 106/64 (!) 99/52 110/64 (!) 106/58  Pulse: (!) 129 63 61 61  Resp: 20 20  18   Temp: 98.1 F (36.7 C) 98.3 F (36.8 C)  98.7 F (37.1 C)  TempSrc: Oral Oral  Oral  SpO2: 95% 100%  100%  Weight:  64.3 kg (141 lb 12.1 oz)    Height:        Intake/Output Summary (Last 24 hours) at 08/08/16 1704 Last data filed at 08/08/16 1520  Gross per 24 hour   Intake              720 ml  Output             5075 ml  Net            -4355 ml    Exam:  Examination:  General exam: Appears calm and comfortable  Respiratory system: Clear to auscultation. Respiratory effort normal. Cardiovascular system: S1 & S2 heard, RRR. No JVD, murmurs, rubs, gallops or clicks. 2 + pedal edema. Gastrointestinal system: Abdomen is nondistended, soft and nontender. No organomegaly or masses felt. Normal bowel sounds heard. Central nervous system: Alert and oriented. No focal neurological deficits. Extremities: Symmetric 5 x 5 power. Skin: No rashes, lesions or ulcers Psychiatry: Judgement and insight appear normal. Mood & affect appropriate.     Data Reviewed: I have personally reviewed following labs and imaging studies  Micro Results No results found for this or any previous visit (from the past 240 hour(s)).  Radiology Reports Dg Chest 2 View  Result Date: 08/03/2016 CLINICAL DATA:  82 year old with bilateral lower extremity edema and shortness of breath which began 3 days ago. Current history of black lung disease. Personal history of prostate cancer and lung cancer. Oxygen dependent COPD. EXAM: CHEST  2 VIEW COMPARISON:  07/24/2016, 07/06/2016 and earlier, including CT chest 08/07/2013. FINDINGS: Sternotomy for CABG. Left subclavian biventricular pacing defibrillator unchanged and appears intact. Residual right subclavian ventricular lead unchanged. Cardiac silhouette moderately to markedly enlarged, unchanged. Thoracic aorta atherosclerotic, unchanged. Hilar and mediastinal contours otherwise unremarkable. Mild diffuse interstitial pulmonary edema and small bilateral pleural effusions, increased since the examination 10 days ago. No confluent airspace consolidation. Degenerative changes involving the thoracic and upper lumbar spine. IMPRESSION: Mild CHF and small bilateral pleural effusions, worse than on the examination 10 days ago. Electronically Signed    By: Evangeline Dakin M.D.   On: 08/03/2016 12:19   Dg Chest 2 View  Result Date: 07/24/2016 CLINICAL DATA:  Shortness of breath. EXAM: CHEST  2 VIEW COMPARISON:  July 06, 2016 FINDINGS: No pneumothorax. Cardiomegaly. Bilateral pulmonary opacities could represent developing infiltrate or edema. AICD device is identified. No other acute abnormalities are seen. IMPRESSION: Cardiomegaly. Bilateral pulmonary opacities. The pulmonary opacities are not specific based on imaging. History of shortness of breath with leg swelling and no cough suggests edema. Developing infiltrate not excluded on this study. Recommend clinical correlation and follow-up to resolution. Electronically Signed   By: Dorise Bullion III M.D   On: 07/24/2016 17:33     CBC  Recent Labs Lab 08/03/16 1107 08/05/16 0438 08/06/16 0526 08/08/16 0515  WBC 4.0 3.6* 3.8* 4.5  HGB 8.8* 7.6* 8.1* 8.5*  HCT 27.9* 24.7* 26.2* 26.7*  PLT 127* 116* 123* 149*  MCV 96.9 98.4 97.8 98.5  MCH 30.6 30.3 30.2 31.4  MCHC 31.5 30.8 30.9 31.8  RDW 20.1* 20.4* 20.3* 20.7*    Chemistries  Recent Labs Lab 08/03/16 1107 08/04/16 0337 08/05/16 0438 08/06/16 0526 08/08/16 0515  NA 131* 133* 134* 131* 132*  K 4.6 3.9 2.9* 4.8 3.0*  CL 95* 96* 95* 93* 89*  CO2 22 26 29 29  33*  GLUCOSE 95 95 97 95 114*  BUN 38* 39* 27* 24* 24*  CREATININE 1.33* 1.45* 1.22 1.12 1.19  CALCIUM 9.1 8.9 8.4* 8.7* 8.9  AST  --   --  20 30 21   ALT  --   --  27 22 22   ALKPHOS  --   --  112 120 138*  BILITOT  --   --  1.2 1.7* 1.0   ------------------------------------------------------------------------------------------------------------------ estimated creatinine clearance is 38.5 mL/min (by C-G formula based on SCr of 1.19 mg/dL). ------------------------------------------------------------------------------------------------------------------ No results for input(s): HGBA1C in the last 72  hours. ------------------------------------------------------------------------------------------------------------------ No results for input(s): CHOL, HDL, LDLCALC, TRIG, CHOLHDL, LDLDIRECT in the last 72 hours. ------------------------------------------------------------------------------------------------------------------ No results for input(s): TSH, T4TOTAL, T3FREE, THYROIDAB in the last 72 hours.  Invalid input(s): FREET3 ------------------------------------------------------------------------------------------------------------------ No results for input(s): VITAMINB12, FOLATE, FERRITIN, TIBC, IRON, RETICCTPCT in the last 72 hours.  Coagulation profile No results for input(s): INR, PROTIME in the last 168 hours.  No results for input(s): DDIMER in the last 72 hours.  Cardiac Enzymes  Recent Labs Lab 08/03/16 1835  TROPONINI 0.03*   ------------------------------------------------------------------------------------------------------------------ Invalid input(s): POCBNP   CBG: No results for input(s): GLUCAP in the last 168 hours.     Studies: No results found.    Lab Results  Component Value Date   HGBA1C 5.4 10/27/2015   HGBA1C 5.6 05/14/2011   HGBA1C 5.6 02/10/2011   Lab Results  Component Value Date   LDLCALC 92 10/27/2015   CREATININE 1.19 08/08/2016       Scheduled Meds: . allopurinol  300 mg Oral Daily  . apixaban  2.5 mg Oral BID  . furosemide  60 mg Intravenous Q8H  . isosorbide mononitrate  30 mg Oral Daily  . metoprolol succinate  25 mg Oral Daily  . pantoprazole  40 mg Oral Daily  . potassium chloride  40 mEq Oral BID  . sodium chloride flush  3 mL Intravenous Q12H  . vitamin B-12  1,000 mcg Oral BID   Continuous Infusions: . sodium chloride       LOS: 5 days    Time spent: >30 MINS    [facility]  [facility] Pager [phone] If 7PM-7AM, please contact night-coverage at [email or web address], password [facility] 08/08/2016, 5:04 PM   LOS: 5 days

## 2016-08-08 NOTE — Progress Notes (Signed)
Progress Note  Patient Name: [name] Date of Encounter: 08/08/2016  Primary Cardiologist: Hochrein   Subjective   83 y.o. male with a hx of Chronic systolic CHF (last EF 91-47%), h/o CRT-D (with [employer] change 2015 to PACEMAKER ONLY), CAD s/p CABG 2004 chronic atrial fib, CKD III, hypotension, carotid artery disease, chronic respiratory failure on home O2, CVA, DM, gout, GERD, HTN, HLD, lung CA, prostate CA, bradycardia, possible GIB 11/6-11/12/2015 (xarelto held then restarted at DC), admit for LE ischemia s/p thombectomy 11/10-11/16/2017 (Xarelto changed to Eliquis), hyponatremia whom we are asked to see for CHF by Dr. Allyson Sabal  I/O are -10.1 liters so far this admission. Overall feeling about the same .    Inpatient Medications    Scheduled Meds: . allopurinol  300 mg Oral Daily  . apixaban  2.5 mg Oral BID  . furosemide  60 mg Intravenous Q8H  . isosorbide mononitrate  30 mg Oral Daily  . metoprolol succinate  25 mg Oral Daily  . pantoprazole  40 mg Oral Daily  . sodium chloride flush  3 mL Intravenous Q12H  . vitamin B-12  1,000 mcg Oral BID   Continuous Infusions: . sodium chloride     PRN Meds: sodium chloride, acetaminophen, albuterol, ALPRAZolam, nitroGLYCERIN, ondansetron (ZOFRAN) IV, ondansetron, sodium chloride flush   Vital Signs    Vitals:   08/07/16 1449 08/07/16 1500 08/07/16 2251 08/08/16 0452  BP: 109/70  106/64 (!) 99/52  Pulse: 64  (!) 129 63  Resp: 16  20 20   Temp: 98 F (36.7 C)  98.1 F (36.7 C) 98.3 F (36.8 C)  TempSrc: Oral  Oral Oral  SpO2: 99% (!) 87% 95% 100%  Weight:    141 lb 12.1 oz (64.3 kg)  Height:        Intake/Output Summary (Last 24 hours) at 08/08/16 0805 Last data filed at 08/08/16 0710  Gross per 24 hour  Intake              680 ml  Output             4175 ml  Net            -3495 ml   Filed Weights   08/06/16 0652 08/07/16 0700 08/08/16 0452  Weight: 143 lb 14.4 oz (65.3 kg) 143 lb 11.8 oz (65.2 kg) 141  lb 12.1 oz (64.3 kg)    Telemetry    V pacing  - Personally Reviewed  ECG    V pacing  - Personally Reviewed  Physical Exam   GEN: elderly man,  Appears chronically ill   Neck: no JVD  Cardiac:   RR , soft systolic murmur  Respiratory:  few coarse bilateral rales   GI: soft, + BS, NT  MS: trace leg edema  Neuro:  nonfocal , difficult to assess  Psych:  Fairly normal   Labs    Chemistry  Recent Labs Lab 08/05/16 0438 08/06/16 0526 08/08/16 0515  NA 134* 131* 132*  K 2.9* 4.8 3.0*  CL 95* 93* 89*  CO2 29 29 33*  GLUCOSE 97 95 114*  BUN 27* 24* 24*  CREATININE 1.22 1.12 1.19  CALCIUM 8.4* 8.7* 8.9  PROT 6.3* 7.0 7.1  ALBUMIN 2.7* 2.9* 2.8*  AST 20 30 21   ALT 27 22 22   ALKPHOS 112 120 138*  BILITOT 1.2 1.7* 1.0  GFRNONAA 53* 59* 55*  GFRAA >60 >60 >60  ANIONGAP 10 9 10  Hematology  Recent Labs Lab 08/05/16 0438 08/06/16 0526 08/08/16 0515  WBC 3.6* 3.8* 4.5  RBC 2.51* 2.68* 2.71*  HGB 7.6* 8.1* 8.5*  HCT 24.7* 26.2* 26.7*  MCV 98.4 97.8 98.5  MCH 30.3 30.2 31.4  MCHC 30.8 30.9 31.8  RDW 20.4* 20.3* 20.7*  PLT 116* 123* 149*    Cardiac Enzymes  Recent Labs Lab 08/03/16 1835  TROPONINI 0.03*     Recent Labs Lab 08/03/16 1204  TROPIPOC 0.03     BNP  Recent Labs Lab 08/03/16 1146  BNP 2,772.1*     DDimer No results for input(s): DDIMER in the last 168 hours.   Radiology    No results found.  Cardiac Studies      Patient Profile     83 y.o. male  With severe LV dysfunction. Admitted with weight gain and volume overload   Assessment & Plan    1. Acute on chronic systolic CHF: Has diuresed 10.1 liters so far . Lungs sound better than yesterday .  Potassium is 3.0 .   This hyplkalemia is likely due to the addition of metolazone .   Received addition a kdur yesterday .  Continue IV lasix.  Would hold off on further metolazone for now.     No new recs.   2. Chronic Afib.   S/p pacer .   Stable   3. COPD - plans  per IM    Signed, Mertie Moores, MD  08/08/2016, 8:05 AM

## 2016-08-09 MED ORDER — TORSEMIDE 20 MG PO TABS
20.0000 mg | ORAL_TABLET | Freq: Every day | ORAL | Status: DC
  Administered 2016-08-09 – 2016-08-11 (×3): 20 mg via ORAL
  Filled 2016-08-09 (×3): qty 1

## 2016-08-09 MED ORDER — POTASSIUM CHLORIDE CRYS ER 20 MEQ PO TBCR
40.0000 meq | EXTENDED_RELEASE_TABLET | Freq: Three times a day (TID) | ORAL | Status: AC
  Administered 2016-08-09 – 2016-08-10 (×4): 40 meq via ORAL
  Filled 2016-08-09 (×4): qty 2

## 2016-08-09 NOTE — Progress Notes (Signed)
Physical Therapy Treatment Patient Details Name: [name] MRN: [id number] DOB: Feb 07, 1933 Today's Date: 08/09/2016    History of Present Illness Pt is an 83 y.o. male who presented to the ED with shortness of breath and lower extremity edema with MD evaluation recealing acute on chronic respiratory failure likely related to acute on chronic combined systolic diastolic heart failure. Pt was recently discharged for the same 5 days ago. He has a PMH significant for but not limited to black lung disease, CAD, carotid artery occlusion, chronic atrial gibrillation, chronic respiratory failure, COPD, CVA, DM, GERD, gout, hypercholesterolemia, hypertension, and ischemic cardiomyopathy.     PT Comments    Pt making steady gains in gait stability and overall stamina.   Follow Up Recommendations  Home health PT;Supervision - Intermittent     Equipment Recommendations  None recommended by PT    Recommendations for Other Services       Precautions / Restrictions Precautions Precautions: Fall;ICD/Pacemaker Precaution Comments: h/o 2 falls in past year    Mobility  Bed Mobility Overal bed mobility: Needs Assistance Bed Mobility: Supine to Sit;Sit to Supine     Supine to sit: Modified independent (Device/Increase time) Sit to supine: Modified independent (Device/Increase time)   General bed mobility comments: smooth transitions into bed.  Transfers Overall transfer level: Needs assistance Equipment used: Rolling walker (2 wheeled) Transfers: Sit to/from Stand Sit to Stand: Supervision            Ambulation/Gait Ambulation/Gait assistance: Supervision Ambulation Distance (Feet): 200 Feet Assistive device: Rolling walker (2 wheeled) Gait Pattern/deviations: Step-through pattern   Gait velocity interpretation: at or above normal speed for age/gender General Gait Details: steady with RW, mild dyspnea noted, but otherwise good response to this longer distance gait.  Unable to  get a SpO2 /EHR reading due to cold fingers.   Stairs            Wheelchair Mobility    Modified Rankin (Stroke Patients Only)       Balance Overall balance assessment: Needs assistance Sitting-balance support: No upper extremity supported;Feet supported Sitting balance-Leahy Scale: Fair     Standing balance support: During functional activity Standing balance-Leahy Scale: Fair                              Cognition Arousal/Alertness: Awake/alert Behavior During Therapy: WFL for tasks assessed/performed Overall Cognitive Status: Within Functional Limits for tasks assessed                                        Exercises      General Comments General comments (skin integrity, edema, etc.): pt deferred standing exercise.      Pertinent Vitals/Pain Pain Assessment: Faces Faces Pain Scale: No hurt    Home Living                      Prior Function            PT Goals (current goals can now be found in the care plan section) Acute Rehab PT Goals Patient Stated Goal: to stay in the hospital until I'm ready to go home. PT Goal Formulation: With patient Time For Goal Achievement: 08/20/16 Potential to Achieve Goals: Good Progress towards PT goals: Progressing toward goals    Frequency    Min 3X/week  PT Plan Current plan remains appropriate    Co-evaluation              AM-PAC PT "6 Clicks" Daily Activity  Outcome Measure  Difficulty turning over in bed (including adjusting bedclothes, sheets and blankets)?: None Difficulty moving from lying on back to sitting on the side of the bed? : None Difficulty sitting down on and standing up from a chair with arms (e.g., wheelchair, bedside commode, etc,.)?: None Help needed moving to and from a bed to chair (including a wheelchair)?: A Little Help needed walking in hospital room?: A Little Help needed climbing 3-5 steps with a railing? : A Little 6 Click  Score: 21    End of Session   Activity Tolerance: Patient tolerated treatment well Patient left: with call bell/phone within reach;in bed Nurse Communication: Mobility status PT Visit Diagnosis: Unsteadiness on feet (R26.81);History of falling (Z91.81)     Time: 1559-1610 PT Time Calculation (min) (ACUTE ONLY): 11 min  Charges:  $Gait Training: 8-22 mins                    G Codes:           Tessie Fass Dorice Stiggers 08/09/2016, 4:23 PM

## 2016-08-09 NOTE — Progress Notes (Signed)
Progress Note  Patient Name: [name] Date of Encounter: 08/09/2016  Primary Cardiologist: Hochrein   Subjective   83 y.o.malewith a hx of Chronic systolic CHF (last EF 27-51%), h/o CRT-D (with [employer] gen change 2015 to PACEMAKER ONLY), CAD s/p CABG 2004 chronic atrial fib, CKD III, hypotension, carotid artery disease, chronic respiratory failure on home O2, CVA, DM, gout, GERD, HTN, HLD, lung CA, prostate CA, bradycardia, possible GIB 11/6-11/12/2015 (xarelto held then restarted at DC), admit for LE ischemia s/p thombectomy 11/10-11/16/2017 (Xarelto changed to Eliquis), hyponatremia whom we are asked to see for CHF by Dr. Allyson Sabal  He has been diuresing well with IV lasix -11.5 liters so far this admission   Inpatient Medications    Scheduled Meds: . allopurinol  300 mg Oral Daily  . apixaban  2.5 mg Oral BID  . furosemide  60 mg Intravenous Q8H  . isosorbide mononitrate  30 mg Oral Daily  . metoprolol succinate  25 mg Oral Daily  . pantoprazole  40 mg Oral Daily  . potassium chloride  40 mEq Oral BID  . sodium chloride flush  3 mL Intravenous Q12H  . vitamin B-12  1,000 mcg Oral BID   Continuous Infusions: . sodium chloride     PRN Meds: sodium chloride, acetaminophen, albuterol, ALPRAZolam, nitroGLYCERIN, ondansetron (ZOFRAN) IV, ondansetron, sodium chloride flush   Vital Signs    Vitals:   08/08/16 1447 08/08/16 2337 08/09/16 0523 08/09/16 0629  BP: (!) 106/58 107/67 106/65   Pulse: 61 62 63   Resp: 18 18 18    Temp: 98.7 F (37.1 C) 98.2 F (36.8 C) 98.2 F (36.8 C)   TempSrc: Oral Oral Oral   SpO2: 100% 100% 100%   Weight:    126 lb 12.8 oz (57.5 kg)  Height:        Intake/Output Summary (Last 24 hours) at 08/09/16 0820 Last data filed at 08/09/16 0700  Gross per 24 hour  Intake              780 ml  Output             2175 ml  Net            -1395 ml   Filed Weights   08/07/16 0700 08/08/16 0452 08/09/16 0629  Weight: 143 lb 11.8 oz (65.2 kg)  141 lb 12.1 oz (64.3 kg) 126 lb 12.8 oz (57.5 kg)    Telemetry    V paced  - Personally Reviewed  ECG     V paced  - Personally Reviewed  Physical Exam   GEN: chronically ill appearing elderly man Neck: No JVD Cardiac: RR, soft systolic murmur   Respiratory: few rales in left base,  continues to improved GI: good bowel sounds  MS:  no  edema ,  Improving  Neuro:  Nonfocal , difficult to assess Psych: difficult to assess   Labs    Chemistry Recent Labs Lab 08/05/16 0438 08/06/16 0526 08/08/16 0515  NA 134* 131* 132*  K 2.9* 4.8 3.0*  CL 95* 93* 89*  CO2 29 29 33*  GLUCOSE 97 95 114*  BUN 27* 24* 24*  CREATININE 1.22 1.12 1.19  CALCIUM 8.4* 8.7* 8.9  PROT 6.3* 7.0 7.1  ALBUMIN 2.7* 2.9* 2.8*  AST 20 30 21   ALT 27 22 22   ALKPHOS 112 120 138*  BILITOT 1.2 1.7* 1.0  GFRNONAA 53* 59* 55*  GFRAA >60 >60 >60  ANIONGAP 10 9 10  Hematology Recent Labs Lab 08/05/16 0438 08/06/16 0526 08/08/16 0515  WBC 3.6* 3.8* 4.5  RBC 2.51* 2.68* 2.71*  HGB 7.6* 8.1* 8.5*  HCT 24.7* 26.2* 26.7*  MCV 98.4 97.8 98.5  MCH 30.3 30.2 31.4  MCHC 30.8 30.9 31.8  RDW 20.4* 20.3* 20.7*  PLT 116* 123* 149*    Cardiac Enzymes Recent Labs Lab 08/03/16 1835  TROPONINI 0.03*    Recent Labs Lab 08/03/16 1204  TROPIPOC 0.03     BNP Recent Labs Lab 08/03/16 1146  BNP 2,772.1*     DDimer No results for input(s): DDIMER in the last 168 hours.   Radiology    No results found.  Cardiac Studies      Patient Profile     83 y.o. male with knows severe LV dysfunction   Assessment & Plan    1.  Acute on chronic combined systolic and diastolic CHF:   Slow and steady improvement with continued diuresis.  Net -11.5 liters since admission    I think he is close to being back to baseline   2. Atrial fib - chronic ,  V paced. Continue Eliquis   3.   Signed, Mertie Moores, MD  08/09/2016, 8:20 AM

## 2016-08-09 NOTE — Progress Notes (Signed)
[facility] PROGRESS NOTE  [name] GHW:[id number] DOB: 1932/07/21 DOA: 08/03/2016   PCP: [name], MD     Assessment/Plan: Principal Problem:   Acute on chronic respiratory failure ([employer]) Active Problems:   Chronic a-fib ([place])   Pacemaker   Black lung disease ([place])   Anemia   Acute on chronic combined systolic and diastolic CHF (congestive heart failure) ([facility])   Chronic renal disease, stage III   DM2 (diabetes mellitus, type 2) ([facility])   COPD exacerbation ([place])   Palliative care by specialist    83 y.o. male  with past medical history of sCHF with EF 10-15%, ischemic cardiomyopathy with pacemaker placement, HTN, CAD s/p CABG, A. Fib, severe lung disease due to COPD and black lung disease, on home O2 2.5L, and DM2. This is his fourth admission since January, with his most recent stay 5/15-5/17 as a GIP admission for CHF. He had hospice through [facility] initiated in February at the recommendation of his Cardiologist, Dr. Percival Spanish. On 5/24 he formally stopped Hospice services, citing a desire for further treatment options. He then presented to the ED and was admitted on 08/03/2016 with CHF exacerbation. Palliative consulted to assist in clarifying goals of care. Patient refuses to be discharged home  Assessment and plan #1. Acute on chronic respiratory failure likely related to acute on chronic combined systolic and diastolic heart failure in the setting of COPD. Patient was discharged days ago for same. He reports compliance with his medications. Echo in November 2017 an EF of 15% diffus hypokinesis. BNP 2772. Initial troponin negative. EKG as noted above. Chest x-ray with mild CHF and small bilateral pleural effusions worse than on exam 10 days ago. He was provided Lasix 80 mg in the emergency department.  Continue telemetry- paced rhythm -Wean oxygen to baseline is able -Scheduled nebulizers  #2. Acute on chronic combined CHF. See #1. Chart review indicates at discharge  5 days ago his home Lasix was increased to 40 mg twice a day.  [name] He does not weigh himself daily -Home meds include Lasix, imdur, metoprolol  been diuresing well with IV lasix -11.5 liters so far this admission, now on demadex Got  Zaroxolyn 5/29, closely monitor potassium and sodium  #3. Chronic A. fib on Eliquis and metoprolol. Rate controlled.Mali vasc score 5.  Monitor on telemetry, paced rhythm    #4. COPD/black lung disease/chronic respiratory failure. He is on home oxygen at 2.5 L , 24/ 7. Home medications include albuterol inhaler and nebulizer -Continue oxygen supplementation -Oxygen to baseline as able Check ambulatory pulse oximetry on 2-1/2 L  #5. Chronic kidney disease stage III. Creatinine on admission 1.33. This appears to be close to baseline. -Monitor urine output Renal function stable in the setting of diuresis   6. Anemia. Chronic. Likely related to chronic illness. Hemoglobin 8.8 on admission, now 7.6. Chart review indicates this is close to his baseline. No signs symptoms of active bleeding. He is on elequis -monitor -FOBT   if drops further consider stopping eliquis ? Follow CBC  7. Hypokalemia  Repleted     DVT prophylaxsis eliquis  Code Status:  DO NOT RESUSCITATE    Family Communication: Discussed in detail with the patient, all imaging results, lab results explained to the patient   Disposition Plan:   Disposition as per cardiology, patient refuses to be discharged home "what's the point I will come back"      Consultants:  Cardiology  Procedures:  None  Antibiotics: Anti-infectives    None         HPI/Subjective:  Feels shortness of breath has improved ,still refuses to go hom" may be in a couple of days" Objective: Vitals:   08/08/16 2337 08/09/16 0523 08/09/16 0629 08/09/16 0942  BP: 107/67 106/65  118/72  Pulse: 62 63  72  Resp: 18 18    Temp: 98.2 F (36.8 C) 98.2 F (36.8 C)    TempSrc: Oral Oral    SpO2: 100%  100%    Weight:   57.5 kg (126 lb 12.8 oz)   Height:        Intake/Output Summary (Last 24 hours) at 08/09/16 1411 Last data filed at 08/09/16 0945  Gross per 24 hour  Intake              540 ml  Output             1750 ml  Net            -1210 ml    Exam:  Examination:  General exam: Appears calm and comfortable  Respiratory system: Clear to auscultation. Respiratory effort normal. Cardiovascular system: S1 & S2 heard, RRR. No JVD, murmurs, rubs, gallops or clicks. 2 + pedal edema. Gastrointestinal system: Abdomen is nondistended, soft and nontender. No organomegaly or masses felt. Normal bowel sounds heard. Central nervous system: Alert and oriented. No focal neurological deficits. Extremities: Symmetric 5 x 5 power. Skin: No rashes, lesions or ulcers Psychiatry: Judgement and insight appear normal. Mood & affect appropriate.     Data Reviewed: I have personally reviewed following labs and imaging studies  Micro Results No results found for this or any previous visit (from the past 240 hour(s)).  Radiology Reports Dg Chest 2 View  Result Date: 08/03/2016 CLINICAL DATA:  83 year old with bilateral lower extremity edema and shortness of breath which began 3 days ago. Current history of black lung disease. Personal history of prostate cancer and lung cancer. Oxygen dependent COPD. EXAM: CHEST  2 VIEW COMPARISON:  07/24/2016, 07/06/2016 and earlier, including CT chest 08/07/2013. FINDINGS: Sternotomy for CABG. Left subclavian biventricular pacing defibrillator unchanged and appears intact. Residual right subclavian ventricular lead unchanged. Cardiac silhouette moderately to markedly enlarged, unchanged. Thoracic aorta atherosclerotic, unchanged. Hilar and mediastinal contours otherwise unremarkable. Mild diffuse interstitial pulmonary edema and small bilateral pleural effusions, increased since the examination 10 days ago. No confluent airspace consolidation. Degenerative changes  involving the thoracic and upper lumbar spine. IMPRESSION: Mild CHF and small bilateral pleural effusions, worse than on the examination 10 days ago. Electronically Signed   By: Evangeline Dakin M.D.   On: 08/03/2016 12:19   Dg Chest 2 View  Result Date: 07/24/2016 CLINICAL DATA:  Shortness of breath. EXAM: CHEST  2 VIEW COMPARISON:  July 06, 2016 FINDINGS: No pneumothorax. Cardiomegaly. Bilateral pulmonary opacities could represent developing infiltrate or edema. AICD device is identified. No other acute abnormalities are seen. IMPRESSION: Cardiomegaly. Bilateral pulmonary opacities. The pulmonary opacities are not specific based on imaging. History of shortness of breath with leg swelling and no cough suggests edema. Developing infiltrate not excluded on this study. Recommend clinical correlation and follow-up to resolution. Electronically Signed   By: Dorise Bullion III M.D   On: 07/24/2016 17:33     CBC  Recent Labs Lab 08/03/16 1107 08/05/16 0438 08/06/16 0526 08/08/16 0515  WBC 4.0 3.6* 3.8* 4.5  HGB 8.8* 7.6* 8.1* 8.5*  HCT 27.9* 24.7* 26.2* 26.7*  PLT 127* 116* 123*  149*  MCV 96.9 98.4 97.8 98.5  MCH 30.6 30.3 30.2 31.4  MCHC 31.5 30.8 30.9 31.8  RDW 20.1* 20.4* 20.3* 20.7*    Chemistries   Recent Labs Lab 08/03/16 1107 08/04/16 0337 08/05/16 0438 08/06/16 0526 08/08/16 0515  NA 131* 133* 134* 131* 132*  K 4.6 3.9 2.9* 4.8 3.0*  CL 95* 96* 95* 93* 89*  CO2 22 26 29 29  33*  GLUCOSE 95 95 97 95 114*  BUN 38* 39* 27* 24* 24*  CREATININE 1.33* 1.45* 1.22 1.12 1.19  CALCIUM 9.1 8.9 8.4* 8.7* 8.9  AST  --   --  20 30 21   ALT  --   --  27 22 22   ALKPHOS  --   --  112 120 138*  BILITOT  --   --  1.2 1.7* 1.0   ------------------------------------------------------------------------------------------------------------------ estimated creatinine clearance is 38.5 mL/min (by C-G formula based on SCr of 1.19  mg/dL). ------------------------------------------------------------------------------------------------------------------ No results for input(s): HGBA1C in the last 72 hours. ------------------------------------------------------------------------------------------------------------------ No results for input(s): CHOL, HDL, LDLCALC, TRIG, CHOLHDL, LDLDIRECT in the last 72 hours. ------------------------------------------------------------------------------------------------------------------ No results for input(s): TSH, T4TOTAL, T3FREE, THYROIDAB in the last 72 hours.  Invalid input(s): FREET3 ------------------------------------------------------------------------------------------------------------------ No results for input(s): VITAMINB12, FOLATE, FERRITIN, TIBC, IRON, RETICCTPCT in the last 72 hours.  Coagulation profile No results for input(s): INR, PROTIME in the last 168 hours.  No results for input(s): DDIMER in the last 72 hours.  Cardiac Enzymes  Recent Labs Lab 08/03/16 1835  TROPONINI 0.03*   ------------------------------------------------------------------------------------------------------------------ Invalid input(s): POCBNP   CBG: No results for input(s): GLUCAP in the last 168 hours.     Studies: No results found.    Lab Results  Component Value Date   HGBA1C 5.4 10/27/2015   HGBA1C 5.6 05/14/2011   HGBA1C 5.6 02/10/2011   Lab Results  Component Value Date   LDLCALC 92 10/27/2015   CREATININE 1.19 08/08/2016       Scheduled Meds: . allopurinol  300 mg Oral Daily  . apixaban  2.5 mg Oral BID  . isosorbide mononitrate  30 mg Oral Daily  . metoprolol succinate  25 mg Oral Daily  . pantoprazole  40 mg Oral Daily  . potassium chloride  40 mEq Oral TID  . sodium chloride flush  3 mL Intravenous Q12H  . torsemide  20 mg Oral Daily  . vitamin B-12  1,000 mcg Oral BID   Continuous Infusions: . sodium chloride       LOS: 6 days     Time spent: >30 MINS    Reyne Dumas  [facility] Pager [phone]. If 7PM-7AM, please contact night-coverage at [email or web address], password [facility] 08/09/2016, 2:11 PM  LOS: 6 days

## 2016-08-10 DIAGNOSIS — J9621 Acute and chronic respiratory failure with hypoxia: Secondary | ICD-10-CM

## 2016-08-10 DIAGNOSIS — J9622 Acute and chronic respiratory failure with hypercapnia: Secondary | ICD-10-CM

## 2016-08-10 LAB — CBC
HEMATOCRIT: 31.5 % — AB (ref 39.0–52.0)
HEMOGLOBIN: 10.3 g/dL — AB (ref 13.0–17.0)
MCH: 31.7 pg (ref 26.0–34.0)
MCHC: 32.7 g/dL (ref 30.0–36.0)
MCV: 96.9 fL (ref 78.0–100.0)
Platelets: 162 10*3/uL (ref 150–400)
RBC: 3.25 MIL/uL — ABNORMAL LOW (ref 4.22–5.81)
RDW: 20 % — ABNORMAL HIGH (ref 11.5–15.5)
WBC: 3.7 10*3/uL — AB (ref 4.0–10.5)

## 2016-08-10 LAB — COMPREHENSIVE METABOLIC PANEL
ALT: 24 U/L (ref 17–63)
ANION GAP: 11 (ref 5–15)
AST: 27 U/L (ref 15–41)
Albumin: 3.1 g/dL — ABNORMAL LOW (ref 3.5–5.0)
Alkaline Phosphatase: 138 U/L — ABNORMAL HIGH (ref 38–126)
BILIRUBIN TOTAL: 1.2 mg/dL (ref 0.3–1.2)
BUN: 34 mg/dL — AB (ref 6–20)
CHLORIDE: 87 mmol/L — AB (ref 101–111)
CO2: 31 mmol/L (ref 22–32)
Calcium: 9.5 mg/dL (ref 8.9–10.3)
Creatinine, Ser: 1.35 mg/dL — ABNORMAL HIGH (ref 0.61–1.24)
GFR calc Af Amer: 55 mL/min — ABNORMAL LOW (ref >60)
GFR, EST NON AFRICAN AMERICAN: 47 mL/min — AB (ref >60)
Glucose, Bld: 92 mg/dL (ref 65–99)
POTASSIUM: 5 mmol/L (ref 3.5–5.1)
Sodium: 129 mmol/L — ABNORMAL LOW (ref 135–145)
TOTAL PROTEIN: 8 g/dL (ref 6.5–8.1)

## 2016-08-10 NOTE — Progress Notes (Signed)
Progress Note  Patient Name: [name] Date of Encounter: 08/10/2016  Primary Cardiologist: Hochrein   Subjective   83 y.o.malewith a hx of Chronic systolic CHF (last EF 81-99%), h/o CRT-D (with [employer] gen change 2015 to PACEMAKER ONLY), CAD s/p CABG 2004 chronic atrial fib, CKD III, hypotension, carotid artery disease, chronic respiratory failure on home O2, CVA, DM, gout, GERD, HTN, HLD, lung CA, prostate CA, bradycardia, possible GIB 11/6-11/12/2015 (xarelto held then restarted at DC), admit for LE ischemia s/p thombectomy 11/10-11/16/2017 (Xarelto changed to Eliquis), hyponatremia whom we are asked to see for CHF by Dr. Allyson Sabal  He has been diuresing well with IV lasix -11.7 liters so far this admission   Inpatient Medications    Scheduled Meds: . allopurinol  300 mg Oral Daily  . apixaban  2.5 mg Oral BID  . isosorbide mononitrate  30 mg Oral Daily  . metoprolol succinate  25 mg Oral Daily  . pantoprazole  40 mg Oral Daily  . potassium chloride  40 mEq Oral TID  . sodium chloride flush  3 mL Intravenous Q12H  . torsemide  20 mg Oral Daily  . vitamin B-12  1,000 mcg Oral BID   Continuous Infusions: . sodium chloride     PRN Meds: sodium chloride, acetaminophen, albuterol, ALPRAZolam, nitroGLYCERIN, ondansetron (ZOFRAN) IV, ondansetron, sodium chloride flush   Vital Signs    Vitals:   08/09/16 0942 08/09/16 1449 08/09/16 2227 08/10/16 0552  BP: 118/72 (!) 104/58 100/66 (!) 107/57  Pulse: 72 (!) 139 63 65  Resp:  (!) 24 18 18   Temp:  98 F (36.7 C) 98.1 F (36.7 C) 98.1 F (36.7 C)  TempSrc:  Oral Oral Oral  SpO2:  (!) 83% 100% 100%  Weight:      Height:        Intake/Output Summary (Last 24 hours) at 08/10/16 0935 Last data filed at 08/10/16 0420  Gross per 24 hour  Intake              540 ml  Output              550 ml  Net              -10 ml   Filed Weights   08/07/16 0700 08/08/16 0452 08/09/16 0629  Weight: 143 lb 11.8 oz (65.2 kg) 141 lb  12.1 oz (64.3 kg) 126 lb 12.8 oz (57.5 kg)    Telemetry    V paced  - Personally Reviewed  ECG     V paced  - Personally Reviewed  Physical Exam   GEN: elderly gentleman,   Chronically ill appearing  Neck: no JVD  Cardiac: RR , soft syst. Murmur  Respiratory: scant rales  GI: + BS  MS:  no edema  Neuro:  Nonfocal , difficult to assess Psych: difficult to assess   Labs    Chemistry  Recent Labs Lab 08/06/16 0526 08/08/16 0515 08/10/16 0657  NA 131* 132* 129*  K 4.8 3.0* 5.0  CL 93* 89* 87*  CO2 29 33* 31  GLUCOSE 95 114* 92  BUN 24* 24* 34*  CREATININE 1.12 1.19 1.35*  CALCIUM 8.7* 8.9 9.5  PROT 7.0 7.1 8.0  ALBUMIN 2.9* 2.8* 3.1*  AST 30 21 27   ALT 22 22 24   ALKPHOS 120 138* 138*  BILITOT 1.7* 1.0 1.2  GFRNONAA 59* 55* 47*  GFRAA >60 >60 55*  ANIONGAP 9 10 11      Hematology  Recent Labs Lab 08/06/16 0526 08/08/16 0515 08/10/16 0657  WBC 3.8* 4.5 3.7*  RBC 2.68* 2.71* 3.25*  HGB 8.1* 8.5* 10.3*  HCT 26.2* 26.7* 31.5*  MCV 97.8 98.5 96.9  MCH 30.2 31.4 31.7  MCHC 30.9 31.8 32.7  RDW 20.3* 20.7* 20.0*  PLT 123* 149* 162    Cardiac Enzymes  Recent Labs Lab 08/03/16 1835  TROPONINI 0.03*     Recent Labs Lab 08/03/16 1204  TROPIPOC 0.03     BNP  Recent Labs Lab 08/03/16 1146  BNP 2,772.1*     DDimer No results for input(s): DDIMER in the last 168 hours.   Radiology    No results found.  Cardiac Studies      Patient Profile     83 y.o. male with knows severe LV dysfunction   Assessment & Plan    1.  Acute on chronic combined systolic and diastolic CHF:   Slow and steady improvement with continued diuresis.  Net -11.7 liters since admission He may be close to being ready for DC . He lives by himself.   2. Atrial fib - chronic ,  V paced. Continue Eliquis    Signed, Mertie Moores, MD  08/10/2016, 9:35 AM

## 2016-08-10 NOTE — Progress Notes (Signed)
Occupational Therapy Treatment Patient Details Name: [name] MRN: [id number] DOB: 01-Mar-1933 Today's Date: 08/10/2016    History of present illness Pt is an 83 y.o. male who presented to the ED with shortness of breath and lower extremity edema with MD evaluation recealing acute on chronic respiratory failure likely related to acute on chronic combined systolic diastolic heart failure. Pt was recently discharged for the same 5 days ago. He has a PMH significant for but not limited to black lung disease, CAD, carotid artery occlusion, chronic atrial gibrillation, chronic respiratory failure, COPD, CVA, DM, GERD, gout, hypercholesterolemia, hypertension, and ischemic cardiomyopathy.    OT comments  Pt declined mobility or OOB activity stating "I've walked earlier and I'm not doing it again". Completed education on energy conservation and reducing risk of falls at home. OT signing off.   Follow Up Recommendations  No OT follow up    Equipment Recommendations  None recommended by OT    Recommendations for Other Services      Precautions / Restrictions Precautions Precautions: Fall;ICD/Pacemaker Precaution Comments: h/o 2 falls in past year       Mobility Bed Mobility                  Transfers                      Balance                                           ADL either performed or assessed with clinical judgement   ADL                                       Functional mobility during ADLs:  (pt declined) General ADL Comments: Completed education regarding energy conservation and fall prevention. Pt verbalized understanding.     Vision       Perception     Praxis      Cognition Arousal/Alertness: Awake/alert Behavior During Therapy: WFL for tasks assessed/performed Overall Cognitive Status: Within Functional Limits for tasks assessed                                          Exercises      Shoulder Instructions       General Comments      Pertinent Vitals/ Pain       Pain Assessment: No/denies pain  Home Living                                          Prior Functioning/Environment              Frequency           Progress Toward Goals  OT Goals(current goals can now be found in the care plan section)  Progress towards OT goals: Goals met/education completed, patient discharged from OT (adequate for DC)  Acute Rehab OT Goals Patient Stated Goal: to stay in the hospital until I'm ready to go home. OT Goal Formulation: With patient Time For Goal Achievement: 08/19/16 Potential to Achieve Goals: Good  ADL Goals Pt Will Perform Grooming: with modified independence;standing Pt Will Perform Upper Body Dressing: with modified independence;sitting Pt Will Perform Lower Body Dressing: with modified independence;sit to/from stand Pt Will Transfer to Toilet: with modified independence;bedside commode;ambulating Pt Will Perform Toileting - Clothing Manipulation and hygiene: with modified independence;sit to/from stand Additional ADL Goal #1: Pt will independently generalize 3 energy conservation strategies into morning ADL routine.  Plan Discharge plan remains appropriate;All goals met and education completed, patient discharged from OT services    Co-evaluation                 AM-PAC PT "6 Clicks" Daily Activity     Outcome Measure   Help from another person eating meals?: None Help from another person taking care of personal grooming?: A Little Help from another person toileting, which includes using toliet, bedpan, or urinal?: A Little Help from another person bathing (including washing, rinsing, drying)?: A Little Help from another person to put on and taking off regular upper body clothing?: None Help from another person to put on and taking off regular lower body clothing?: A Little 6 Click Score: 20    End of Session     OT Visit Diagnosis: Unsteadiness on feet (R26.81);Muscle weakness (generalized) (M62.81)   Activity Tolerance Patient limited by fatigue   Patient Left in bed;with call bell/phone within reach;with family/visitor present   Nurse Communication Mobility status        Time: 8786-7672 OT Time Calculation (min): 13 min  Charges: OT General Charges $OT Visit: 1 Procedure OT Treatments $Self Care/Home Management : 8-22 mins  [facility], OT/L  [phone] 08/10/2016   Margrett Kalb,HILLARY 08/10/2016, 3:59 PM

## 2016-08-10 NOTE — Progress Notes (Signed)
PROGRESS NOTE                                                                                                                                                                                                             Patient Demographics:    [name], is a 83 y.o. male, DOB - 01/13/1933, FGH:[id number]  Admit date - 08/03/2016   Admitting Physician Elwin Mocha, MD  Outpatient Primary MD for the patient is [name], MD  LOS - 7    Chief Complaint  Patient presents with  . Shortness of Breath       Brief Narrative   83 year old male with severe ischemic cardiomyopathy with EF of 10-15%, status post pacemaker, hypertension or CAD status post CABG, A. fib, chronic respiratory failure due to COPD and black lung disease on home O2 (2.5 L) and diabetes mellitus type 2 who was on home hospice which he stopped recently wishing to undergo further treatment options. Patient presented to the ED with acute on chronic CHF exacerbation.    Subjective:   Patient reports his breathing to be unchanged and feels weak.   Assessment  & Plan :    Principal Problem:   Acute on chronic respiratory failure (HCC) Secondary to acute on chronic combined systolic and diastolic CHF. Has severe ischemic cardiomyopathy. Diuresed with IV Lasix and now on low-dose torsemide. Weaning oxygen to baseline. Continue scheduled nebulizers. Diuresing well (net neg -11.6 L since admission)   Active Problems:   Chronic a-fib (HCC) Continue eliquis and metoprolol. Has pacer rhythm.     Acute on chronic combined systolic and diastolic CHF (congestive heart failure) ([place]) Plan as outlined above. Has pacemaker. Monitor electrolytes.     Chronic renal disease, stage III  around baseline. Monitor with diuresis.  COPD with chronic respiratory failure/black lung disease Currently maintaining O2 sat baseline. Continue inhalers Nebs when  necessary.  Anemia of chronic disease Slight drop but stable. Monitor.  Hypokalemia Replenish  Goals of care Patient discontinued home hospice and wished to be fully treated. Currently DO NOT RESUSCITATE.       Code Status : DO NOT RESUSCITATE  Family Communication  : None at bedside  Disposition Plan  : Home with home health possibly tomorrow.  Barriers For Discharge : Improving symptoms  Consults  :   Cardiology Palliative  care  Procedures  : None  DVT Prophylaxis  : eliquis  Lab Results  Component Value Date   PLT 162 08/10/2016    Antibiotics  :   Anti-infectives    None        Objective:   Vitals:   08/09/16 1449 08/09/16 2227 08/10/16 0552 08/10/16 0955  BP: (!) 104/58 100/66 (!) 107/57 (!) 101/57  Pulse: (!) 139 63 65 67  Resp: (!) 24 18 18    Temp: 98 F (36.7 C) 98.1 F (36.7 C) 98.1 F (36.7 C)   TempSrc: Oral Oral Oral   SpO2: (!) 83% 100% 100%   Weight:      Height:        Wt Readings from Last 3 Encounters:  08/09/16 57.5 kg (126 lb 12.8 oz)  07/26/16 51.6 kg (113 lb 11.2 oz)  07/06/16 61.2 kg (135 lb)     Intake/Output Summary (Last 24 hours) at 08/10/16 1436 Last data filed at 08/10/16 1404  Gross per 24 hour  Intake              660 ml  Output              750 ml  Net              -90 ml     Physical Exam  Gen: not in distress HEENT:  moist mucosa, supple neck Chest: clear b/l, no added sounds CVS: S1 and S2 irregular, no murmurs GI: soft, NT, ND,  Musculoskeletal: warm, trace edema     Data Review:    CBC  Recent Labs Lab 08/05/16 0438 08/06/16 0526 08/08/16 0515 08/10/16 0657  WBC 3.6* 3.8* 4.5 3.7*  HGB 7.6* 8.1* 8.5* 10.3*  HCT 24.7* 26.2* 26.7* 31.5*  PLT 116* 123* 149* 162  MCV 98.4 97.8 98.5 96.9  MCH 30.3 30.2 31.4 31.7  MCHC 30.8 30.9 31.8 32.7  RDW 20.4* 20.3* 20.7* 20.0*    Chemistries   Recent Labs Lab 08/04/16 0337 08/05/16 0438 08/06/16 0526 08/08/16 0515 08/10/16 0657   NA 133* 134* 131* 132* 129*  K 3.9 2.9* 4.8 3.0* 5.0  CL 96* 95* 93* 89* 87*  CO2 26 29 29  33* 31  GLUCOSE 95 97 95 114* 92  BUN 39* 27* 24* 24* 34*  CREATININE 1.45* 1.22 1.12 1.19 1.35*  CALCIUM 8.9 8.4* 8.7* 8.9 9.5  AST  --  20 30 21 27   ALT  --  27 22 22 24   ALKPHOS  --  112 120 138* 138*  BILITOT  --  1.2 1.7* 1.0 1.2   ------------------------------------------------------------------------------------------------------------------ No results for input(s): CHOL, HDL, LDLCALC, TRIG, CHOLHDL, LDLDIRECT in the last 72 hours.  Lab Results  Component Value Date   HGBA1C 5.4 10/27/2015   ------------------------------------------------------------------------------------------------------------------ No results for input(s): TSH, T4TOTAL, T3FREE, THYROIDAB in the last 72 hours.  Invalid input(s): FREET3 ------------------------------------------------------------------------------------------------------------------ No results for input(s): VITAMINB12, FOLATE, FERRITIN, TIBC, IRON, RETICCTPCT in the last 72 hours.  Coagulation profile No results for input(s): INR, PROTIME in the last 168 hours.  No results for input(s): DDIMER in the last 72 hours.  Cardiac Enzymes  Recent Labs Lab 08/03/16 1835  TROPONINI 0.03*   ------------------------------------------------------------------------------------------------------------------    Component Value Date/Time   BNP 2,772.1 (H) 08/03/2016 1146    Inpatient Medications  Scheduled Meds: . allopurinol  300 mg Oral Daily  . apixaban  2.5 mg Oral BID  . isosorbide mononitrate  30 mg Oral Daily  .  metoprolol succinate  25 mg Oral Daily  . pantoprazole  40 mg Oral Daily  . potassium chloride  40 mEq Oral TID  . sodium chloride flush  3 mL Intravenous Q12H  . torsemide  20 mg Oral Daily  . vitamin B-12  1,000 mcg Oral BID   Continuous Infusions: . sodium chloride     PRN Meds:.sodium chloride, acetaminophen,  albuterol, ALPRAZolam, nitroGLYCERIN, ondansetron (ZOFRAN) IV, ondansetron, sodium chloride flush  Micro Results No results found for this or any previous visit (from the past 240 hour(s)).  Radiology Reports Dg Chest 2 View  Result Date: 08/03/2016 CLINICAL DATA:  83 year old with bilateral lower extremity edema and shortness of breath which began 3 days ago. Current history of black lung disease. Personal history of prostate cancer and lung cancer. Oxygen dependent COPD. EXAM: CHEST  2 VIEW COMPARISON:  07/24/2016, 07/06/2016 and earlier, including CT chest 08/07/2013. FINDINGS: Sternotomy for CABG. Left subclavian biventricular pacing defibrillator unchanged and appears intact. Residual right subclavian ventricular lead unchanged. Cardiac silhouette moderately to markedly enlarged, unchanged. Thoracic aorta atherosclerotic, unchanged. Hilar and mediastinal contours otherwise unremarkable. Mild diffuse interstitial pulmonary edema and small bilateral pleural effusions, increased since the examination 10 days ago. No confluent airspace consolidation. Degenerative changes involving the thoracic and upper lumbar spine. IMPRESSION: Mild CHF and small bilateral pleural effusions, worse than on the examination 10 days ago. Electronically Signed   By: Evangeline Dakin M.D.   On: 08/03/2016 12:19   Dg Chest 2 View  Result Date: 07/24/2016 CLINICAL DATA:  Shortness of breath. EXAM: CHEST  2 VIEW COMPARISON:  July 06, 2016 FINDINGS: No pneumothorax. Cardiomegaly. Bilateral pulmonary opacities could represent developing infiltrate or edema. AICD device is identified. No other acute abnormalities are seen. IMPRESSION: Cardiomegaly. Bilateral pulmonary opacities. The pulmonary opacities are not specific based on imaging. History of shortness of breath with leg swelling and no cough suggests edema. Developing infiltrate not excluded on this study. Recommend clinical correlation and follow-up to resolution.  Electronically Signed   By: Dorise Bullion III M.D   On: 07/24/2016 17:33    Time Spent in minutes  25   [name] M.D on 08/10/2016 at 2:36 PM  Between 7am to 7pm - Pager - [phone]  After 7pm go to [email or web address] - password [facility]  [facility] -  Office  [phone]

## 2016-08-11 DIAGNOSIS — E875 Hyperkalemia: Secondary | ICD-10-CM

## 2016-08-11 DIAGNOSIS — I5043 Acute on chronic combined systolic (congestive) and diastolic (congestive) heart failure: Secondary | ICD-10-CM

## 2016-08-11 DIAGNOSIS — N179 Acute kidney failure, unspecified: Secondary | ICD-10-CM

## 2016-08-11 DIAGNOSIS — N183 Chronic kidney disease, stage 3 (moderate): Secondary | ICD-10-CM

## 2016-08-11 DIAGNOSIS — E871 Hypo-osmolality and hyponatremia: Secondary | ICD-10-CM

## 2016-08-11 DIAGNOSIS — I482 Chronic atrial fibrillation: Secondary | ICD-10-CM

## 2016-08-11 LAB — BASIC METABOLIC PANEL
ANION GAP: 12 (ref 5–15)
BUN: 37 mg/dL — AB (ref 6–20)
CALCIUM: 9.2 mg/dL (ref 8.9–10.3)
CO2: 28 mmol/L (ref 22–32)
Chloride: 86 mmol/L — ABNORMAL LOW (ref 101–111)
Creatinine, Ser: 1.43 mg/dL — ABNORMAL HIGH (ref 0.61–1.24)
GFR calc Af Amer: 51 mL/min — ABNORMAL LOW (ref >60)
GFR, EST NON AFRICAN AMERICAN: 44 mL/min — AB (ref >60)
Glucose, Bld: 91 mg/dL (ref 65–99)
Potassium: 5.3 mmol/L — ABNORMAL HIGH (ref 3.5–5.1)
Sodium: 126 mmol/L — ABNORMAL LOW (ref 135–145)

## 2016-08-11 NOTE — Progress Notes (Signed)
Progress Note  Patient Name: [name] Date of Encounter: 08/11/2016  Primary Cardiologist: Hochrein   Subjective   83 y.o.malewith a hx of Chronic systolic CHF (last EF 53-61%), h/o CRT-D (with [employer] gen change 2015 to PACEMAKER ONLY), CAD s/p CABG 2004 chronic atrial fib, CKD III, hypotension, carotid artery disease, chronic respiratory failure on home O2, CVA, DM, gout, GERD, HTN, HLD, lung CA, prostate CA, bradycardia, possible GIB 11/6-11/12/2015 (xarelto held then restarted at DC), admit for LE ischemia s/p thombectomy 11/10-11/16/2017 (Xarelto changed to Eliquis), hyponatremia whom we are asked to see for CHF by Dr. Allyson Sabal  He has been diuresing well with IV lasix -11.7 liters so far this admission  Feels a little "poor" this AM, sitting in chair, felt some chest pain left sided that has subsided.    Inpatient Medications    Scheduled Meds: . allopurinol  300 mg Oral Daily  . apixaban  2.5 mg Oral BID  . isosorbide mononitrate  30 mg Oral Daily  . metoprolol succinate  25 mg Oral Daily  . pantoprazole  40 mg Oral Daily  . sodium chloride flush  3 mL Intravenous Q12H  . torsemide  20 mg Oral Daily  . vitamin B-12  1,000 mcg Oral BID   Continuous Infusions: . sodium chloride     PRN Meds: sodium chloride, acetaminophen, albuterol, ALPRAZolam, nitroGLYCERIN, ondansetron (ZOFRAN) IV, ondansetron, sodium chloride flush   Vital Signs    Vitals:   08/10/16 0552 08/10/16 0955 08/10/16 2106 08/11/16 0535  BP: (!) 107/57 (!) 101/57 114/61 114/66  Pulse: 65 67 60 (!) 134  Resp: 18  18 18   Temp: 98.1 F (36.7 C)  97.5 F (36.4 C) 97.4 F (36.3 C)  TempSrc: Oral  Oral Oral  SpO2: 100%  100% (!) 85%  Weight:    117 lb 11.2 oz (53.4 kg)  Height:        Intake/Output Summary (Last 24 hours) at 08/11/16 1149 Last data filed at 08/11/16 1112  Gross per 24 hour  Intake              600 ml  Output              775 ml  Net             -175 ml   Filed Weights   08/08/16 0452 08/09/16 0629 08/11/16 0535  Weight: 141 lb 12.1 oz (64.3 kg) 126 lb 12.8 oz (57.5 kg) 117 lb 11.2 oz (53.4 kg)    Telemetry    V paced  - Personally Reviewed  ECG     V paced  - Personally Reviewed  Physical Exam   GEN: Thin elderly frail in no acute distress  HEENT: normal  Neck: no JVD, carotid bruits, or masses Cardiac: RRR; 1/6 SM, no rubs, or gallops,no edema  Respiratory:  clear to auscultation bilaterally, normal work of breathing GI: soft, nontender, nondistended, + BS MS: no deformity or atrophy  Skin: warm and dry, no rash Neuro:  Alert and Oriented x 3, Strength and sensation are intact Psych: euthymic mood, full affect  Labs    Chemistry  Recent Labs Lab 08/06/16 0526 08/08/16 0515 08/10/16 0657 08/11/16 0540  NA 131* 132* 129* 126*  K 4.8 3.0* 5.0 5.3*  CL 93* 89* 87* 86*  CO2 29 33* 31 28  GLUCOSE 95 114* 92 91  BUN 24* 24* 34* 37*  CREATININE 1.12 1.19 1.35* 1.43*  CALCIUM 8.7* 8.9 9.5  9.2  PROT 7.0 7.1 8.0  --   ALBUMIN 2.9* 2.8* 3.1*  --   AST 30 21 27   --   ALT 22 22 24   --   ALKPHOS 120 138* 138*  --   BILITOT 1.7* 1.0 1.2  --   GFRNONAA 59* 55* 47* 44*  GFRAA >60 >60 55* 51*  ANIONGAP 9 10 11 12      Hematology  Recent Labs Lab 08/06/16 0526 08/08/16 0515 08/10/16 0657  WBC 3.8* 4.5 3.7*  RBC 2.68* 2.71* 3.25*  HGB 8.1* 8.5* 10.3*  HCT 26.2* 26.7* 31.5*  MCV 97.8 98.5 96.9  MCH 30.2 31.4 31.7  MCHC 30.9 31.8 32.7  RDW 20.3* 20.7* 20.0*  PLT 123* 149* 162    Cardiac Enzymes No results for input(s): TROPONINI in the last 168 hours.  No results for input(s): TROPIPOC in the last 168 hours.   BNP No results for input(s): BNP, PROBNP in the last 168 hours.   DDimer No results for input(s): DDIMER in the last 168 hours.   Radiology    No results found.  Cardiac Studies      Patient Profile     83 y. male with EF 15%, DNR, acute on chronic systolic HF, pacer.  Assessment & Plan    Acute on  chronic combined systolic and diastolic CHF  - will hold torsemide 20mg  today given increase in BUN and creat  - net -11.7 liters since admission  - tomorrow if renal function improved, restart torsemide and likely ok to DC.   - He lives by himself.   Atrial fib  - perm ,  V paced. - Continue Eliquis    Signed, Candee Furbish, MD  08/11/2016, 11:49 AM

## 2016-08-11 NOTE — Progress Notes (Addendum)
PROGRESS NOTE                                                                                                                                                                                                             Patient Demographics:    [name], is a 83 y.o. male, DOB - Aug 07, 1932, [id number]  Admit date - 08/03/2016   Admitting Physician Elwin Mocha, MD  Outpatient Primary MD for the patient is [name], MD  LOS - 8    Chief Complaint  Patient presents with  . Shortness of Breath       Brief Narrative   83 year old male with severe ischemic cardiomyopathy with EF of 10-15%, status post pacemaker, hypertension or CAD status post CABG, A. fib, chronic respiratory failure due to COPD and black lung disease on home O2 (2.5 L) and diabetes mellitus type 2 who was on home hospice which he stopped recently wishing to undergo further treatment options. Patient presented to the ED with acute on chronic CHF exacerbation.    Subjective:   Breathing better today.    Assessment  & Plan :    Principal Problem:   Acute on chronic respiratory failure (HCC) Secondary to acute on chronic combined systolic and diastolic CHF. Has severe ischemic cardiomyopathy. Holding torsemide given worsening renal function and hyponatremia.. Has good diuresis. Sats improved. Continue scheduled nebulizers.  If renal functions improved plan to resume torsemide and discharge him home tomorrow.  Active Problems:   Chronic a-fib (HCC) Continue eliquis and metoprolol. Has pacer rhythm.     Acute on chronic combined systolic and diastolic CHF (congestive heart failure) ([place]) Plan as outlined above. Has pacemaker. Monitor electrolytes.     Acute on Chronic renal disease, stage III  Mildly worsened renal function today. Holding torsemide.  COPD with chronic respiratory failure/black lung disease Currently maintaining O2 sat  baseline. Continue inhalers Nebs when necessary.  Anemia of chronic disease Slight drop but stable. Monitor.  Hypokalemia/hyperkalemia Holding further potassium supplement. Monitor in  a.m.  Hyponatremia Holding diuretic today. Continue fluid resection.  Goals of care Patient discontinued home hospice and wished to be fully treated. Currently DO NOT RESUSCITATE.       Code Status : DO NOT RESUSCITATE  Family Communication  : None at bedside  Disposition Plan  : Home with home  health in a.m.  Barriers For Discharge : Improving symptoms  Consults  :   Cardiology Palliative care  Procedures  : None  DVT Prophylaxis  : eliquis  Lab Results  Component Value Date   PLT 162 08/10/2016    Antibiotics  :   Anti-infectives    None        Objective:   Vitals:   08/10/16 0552 08/10/16 0955 08/10/16 2106 08/11/16 0535  BP: (!) 107/57 (!) 101/57 114/61 114/66  Pulse: 65 67 60 (!) 134  Resp: 18  18 18   Temp: 98.1 F (36.7 C)  97.5 F (36.4 C) 97.4 F (36.3 C)  TempSrc: Oral  Oral Oral  SpO2: 100%  100% (!) 85%  Weight:    53.4 kg (117 lb 11.2 oz)  Height:        Wt Readings from Last 3 Encounters:  08/11/16 53.4 kg (117 lb 11.2 oz)  07/26/16 51.6 kg (113 lb 11.2 oz)  07/06/16 61.2 kg (135 lb)     Intake/Output Summary (Last 24 hours) at 08/11/16 1243 Last data filed at 08/11/16 1112  Gross per 24 hour  Intake              600 ml  Output              775 ml  Net             -175 ml     Physical Exam Gen.: Elderly thin built. Not in distress HEENT: Moist mucosa, supple neck Chest: Improved breath sounds bilaterally CVS: S1 and S2 irregular, no murmurs GI: Soft, nondistended, nontender Musculoskeletal: Warm, no edema      Data Review:    CBC  Recent Labs Lab 08/05/16 0438 08/06/16 0526 08/08/16 0515 08/10/16 0657  WBC 3.6* 3.8* 4.5 3.7*  HGB 7.6* 8.1* 8.5* 10.3*  HCT 24.7* 26.2* 26.7* 31.5*  PLT 116* 123* 149* 162  MCV 98.4 97.8  98.5 96.9  MCH 30.3 30.2 31.4 31.7  MCHC 30.8 30.9 31.8 32.7  RDW 20.4* 20.3* 20.7* 20.0*    Chemistries   Recent Labs Lab 08/05/16 0438 08/06/16 0526 08/08/16 0515 08/10/16 0657 08/11/16 0540  NA 134* 131* 132* 129* 126*  K 2.9* 4.8 3.0* 5.0 5.3*  CL 95* 93* 89* 87* 86*  CO2 29 29 33* 31 28  GLUCOSE 97 95 114* 92 91  BUN 27* 24* 24* 34* 37*  CREATININE 1.22 1.12 1.19 1.35* 1.43*  CALCIUM 8.4* 8.7* 8.9 9.5 9.2  AST 20 30 21 27   --   ALT 27 22 22 24   --   ALKPHOS 112 120 138* 138*  --   BILITOT 1.2 1.7* 1.0 1.2  --    ------------------------------------------------------------------------------------------------------------------ No results for input(s): CHOL, HDL, LDLCALC, TRIG, CHOLHDL, LDLDIRECT in the last 72 hours.  Lab Results  Component Value Date   HGBA1C 5.4 10/27/2015   ------------------------------------------------------------------------------------------------------------------ No results for input(s): TSH, T4TOTAL, T3FREE, THYROIDAB in the last 72 hours.  Invalid input(s): FREET3 ------------------------------------------------------------------------------------------------------------------ No results for input(s): VITAMINB12, FOLATE, FERRITIN, TIBC, IRON, RETICCTPCT in the last 72 hours.  Coagulation profile No results for input(s): INR, PROTIME in the last 168 hours.  No results for input(s): DDIMER in the last 72 hours.  Cardiac Enzymes No results for input(s): CKMB, TROPONINI, MYOGLOBIN in the last 168 hours.  Invalid input(s): CK ------------------------------------------------------------------------------------------------------------------    Component Value Date/Time   BNP 2,772.1 (H) 08/03/2016 1146    Inpatient Medications  Scheduled Meds: .  allopurinol  300 mg Oral Daily  . apixaban  2.5 mg Oral BID  . isosorbide mononitrate  30 mg Oral Daily  . metoprolol succinate  25 mg Oral Daily  . pantoprazole  40 mg Oral Daily  .  sodium chloride flush  3 mL Intravenous Q12H  . vitamin B-12  1,000 mcg Oral BID   Continuous Infusions: . sodium chloride     PRN Meds:.sodium chloride, acetaminophen, albuterol, ALPRAZolam, nitroGLYCERIN, ondansetron (ZOFRAN) IV, ondansetron, sodium chloride flush  Micro Results No results found for this or any previous visit (from the past 240 hour(s)).  Radiology Reports Dg Chest 2 View  Result Date: 08/03/2016 CLINICAL DATA:  83 year old with bilateral lower extremity edema and shortness of breath which began 3 days ago. Current history of black lung disease. Personal history of prostate cancer and lung cancer. Oxygen dependent COPD. EXAM: CHEST  2 VIEW COMPARISON:  07/24/2016, 07/06/2016 and earlier, including CT chest 08/07/2013. FINDINGS: Sternotomy for CABG. Left subclavian biventricular pacing defibrillator unchanged and appears intact. Residual right subclavian ventricular lead unchanged. Cardiac silhouette moderately to markedly enlarged, unchanged. Thoracic aorta atherosclerotic, unchanged. Hilar and mediastinal contours otherwise unremarkable. Mild diffuse interstitial pulmonary edema and small bilateral pleural effusions, increased since the examination 10 days ago. No confluent airspace consolidation. Degenerative changes involving the thoracic and upper lumbar spine. IMPRESSION: Mild CHF and small bilateral pleural effusions, worse than on the examination 10 days ago. Electronically Signed   By: Evangeline Dakin M.D.   On: 08/03/2016 12:19   Dg Chest 2 View  Result Date: 07/24/2016 CLINICAL DATA:  Shortness of breath. EXAM: CHEST  2 VIEW COMPARISON:  July 06, 2016 FINDINGS: No pneumothorax. Cardiomegaly. Bilateral pulmonary opacities could represent developing infiltrate or edema. AICD device is identified. No other acute abnormalities are seen. IMPRESSION: Cardiomegaly. Bilateral pulmonary opacities. The pulmonary opacities are not specific based on imaging. History of shortness  of breath with leg swelling and no cough suggests edema. Developing infiltrate not excluded on this study. Recommend clinical correlation and follow-up to resolution. Electronically Signed   By: Dorise Bullion III M.D   On: 07/24/2016 17:33    Time Spent in minutes  25   Louellen Molder M.D on 08/11/2016 at 12:43 PM  Between 7am to 7pm - Pager - [phone]  After 7pm go to [email or web address] - password [facility]  [facility] -  Office  [phone]

## 2016-08-12 DIAGNOSIS — Z950 Presence of cardiac pacemaker: Secondary | ICD-10-CM

## 2016-08-12 LAB — BASIC METABOLIC PANEL
Anion gap: 9 (ref 5–15)
BUN: 33 mg/dL — AB (ref 6–20)
CALCIUM: 9.1 mg/dL (ref 8.9–10.3)
CO2: 31 mmol/L (ref 22–32)
Chloride: 86 mmol/L — ABNORMAL LOW (ref 101–111)
Creatinine, Ser: 1.24 mg/dL (ref 0.61–1.24)
GFR calc Af Amer: >60 mL/min (ref >60)
GFR, EST NON AFRICAN AMERICAN: 52 mL/min — AB (ref >60)
GLUCOSE: 85 mg/dL (ref 65–99)
POTASSIUM: 3.8 mmol/L (ref 3.5–5.1)
Sodium: 126 mmol/L — ABNORMAL LOW (ref 135–145)

## 2016-08-12 NOTE — Progress Notes (Signed)
Progress Note  Patient Name: [name] Date of Encounter: 08/12/2016  Primary Cardiologist: Dr. Percival Spanish  Subjective   Still has intermittent L sided sharp chest pain. Dyspnea back to baseline. No palpitations.   Inpatient Medications    Scheduled Meds: . allopurinol  300 mg Oral Daily  . apixaban  2.5 mg Oral BID  . isosorbide mononitrate  30 mg Oral Daily  . metoprolol succinate  25 mg Oral Daily  . pantoprazole  40 mg Oral Daily  . sodium chloride flush  3 mL Intravenous Q12H  . vitamin B-12  1,000 mcg Oral BID   Continuous Infusions: . sodium chloride     PRN Meds: sodium chloride, acetaminophen, albuterol, ALPRAZolam, nitroGLYCERIN, ondansetron (ZOFRAN) IV, ondansetron, sodium chloride flush   Vital Signs    Vitals:   08/11/16 0535 08/11/16 1536 08/11/16 2129 08/12/16 0550  BP: 114/66 113/81 (!) 98/55 113/62  Pulse: (!) 134 63 63 69  Resp: 18 20    Temp: 97.4 F (36.3 C) 97.7 F (36.5 C) 97.5 F (36.4 C) 98.6 F (37 C)  TempSrc: Oral  Oral   SpO2: (!) 85% 100% 100% 100%  Weight: 117 lb 11.2 oz (53.4 kg)   124 lb (56.2 kg)  Height:        Intake/Output Summary (Last 24 hours) at 08/12/16 1133 Last data filed at 08/12/16 0552  Gross per 24 hour  Intake              358 ml  Output              900 ml  Net             -542 ml   Filed Weights   08/09/16 0629 08/11/16 0535 08/12/16 0550  Weight: 126 lb 12.8 oz (57.5 kg) 117 lb 11.2 oz (53.4 kg) 124 lb (56.2 kg)    Telemetry    Paced rhythm - Personally Reviewed  ECG    None today   Physical Exam   GEN: thin frailacute distress.   Neck: No JVD Cardiac: RRR, 1/6 systolic murmurs, rubs, or gallops.  Respiratory: Clear to auscultation L side. Faint rales on R side.  GI: Soft, nontender, non-distended  MS: No edema; No deformity. Neuro:  Nonfocal  Psych: Normal affect   Labs    Chemistry Recent Labs Lab 08/06/16 0526 08/08/16 0515 08/10/16 0657 08/11/16 0540 08/12/16 0419  NA  131* 132* 129* 126* 126*  K 4.8 3.0* 5.0 5.3* 3.8  CL 93* 89* 87* 86* 86*  CO2 29 33* 31 28 31   GLUCOSE 95 114* 92 91 85  BUN 24* 24* 34* 37* 33*  CREATININE 1.12 1.19 1.35* 1.43* 1.24  CALCIUM 8.7* 8.9 9.5 9.2 9.1  PROT 7.0 7.1 8.0  --   --   ALBUMIN 2.9* 2.8* 3.1*  --   --   AST 30 21 27   --   --   ALT 22 22 24   --   --   ALKPHOS 120 138* 138*  --   --   BILITOT 1.7* 1.0 1.2  --   --   GFRNONAA 59* 55* 47* 44* 52*  GFRAA >60 >60 55* 51* >60  ANIONGAP 9 10 11 12 9      Hematology Recent Labs Lab 08/06/16 0526 08/08/16 0515 08/10/16 0657  WBC 3.8* 4.5 3.7*  RBC 2.68* 2.71* 3.25*  HGB 8.1* 8.5* 10.3*  HCT 26.2* 26.7* 31.5*  MCV 97.8 98.5 96.9  MCH 30.2  31.4 31.7  MCHC 30.9 31.8 32.7  RDW 20.3* 20.7* 20.0*  PLT 123* 149* 162    Cardiac EnzymesNo results for input(s): TROPONINI in the last 168 hours. No results for input(s): TROPIPOC in the last 168 hours.   BNPNo results for input(s): BNP, PROBNP in the last 168 hours.   DDimer No results for input(s): DDIMER in the last 168 hours.   Radiology    No results found.  Cardiac Studies   None this admit  Patient Profile     83 y.o.malewith a hx of Chronic systolic CHF (last EF 55-97%), h/o CRT-D (with [place] gen change 2015 to [place]), CAD s/p CABG 2004 chronic atrial fib, CKD III, hypotension, carotid artery disease, chronic respiratory failure on home O2, CVA, DM, gout, GERD, HTN, HLD, lung CA, prostate CA, bradycardia, possible GIB 11/6-11/12/2015 (xarelto held then restarted at DC), admit for LE ischemia s/p thombectomy 11/10-11/16/2017 (Xarelto changed to Eliquis), hyponatremia whom we are asked to see for CHF by Dr. Allyson Sabal  Assessment & Plan    1. Acute on chronic combined CHF, Stage D - Net I & O negative 12.2 L since admission. Weight down 9lb (133-->124lb). Torsemide on hold due to elevated BUN/Scr. Today Scr back to normal 1.24 and BUN of 33. - Resumption of diuretics by Md. Continue BB.   2.  Chronic Afib - V paced. Continue Eliquis for anticoagulation.   3. L sided chest pain - atypical. Continue protonix. Further eval per MD/prmary. Not pleuritic.   Signed, Leanor Kail, PA  08/12/2016, 11:33 AM    Personally seen and examined. Agree with above.  Hyponatremia noted. Continue to encourage fluid restriction, 1 L daily. I think it may be wise to hold her torsemide 1 more day. Chest pain has resolved, atypical. I would hold off on any further ischemic evaluation. Chronic atrial fibrillation currently paced. Eliquis.  Candee Furbish, MD

## 2016-08-12 NOTE — Progress Notes (Signed)
PROGRESS NOTE                                                                                                                                                                                                             Patient Demographics:    [name], is a 83 y.o. male, DOB - July 08, 1932, [id number]  Admit date - 08/03/2016   Admitting Physician Elwin Mocha, MD  Outpatient Primary MD for the patient is [name], MD  LOS - 9    Chief Complaint  Patient presents with  . Shortness of Breath       Brief Narrative   83 year old male with severe ischemic cardiomyopathy with EF of 10-15%, status post pacemaker, hypertension or CAD status post CABG, A. fib, chronic respiratory failure due to COPD and black lung disease on home O2 (2.5 L) and diabetes mellitus type 2 who was on home hospice which he stopped recently wishing to undergo further treatment options. Patient presented to the ED with acute on chronic CHF exacerbation.    Subjective:   Breathing continues to improve.   Assessment  & Plan :    Principal Problem:   Acute on chronic respiratory failure (HCC) Secondary to acute on chronic combined systolic and diastolic CHF. Has severe ischemic cardiomyopathy. Diuresing well. Holding torsemide due to worsened renal function and hyponatremia. Hopefully can resume torsemide tomorrow and discharged home.   Active Problems:   Chronic a-fib (HCC) Continue eliquis and metoprolol.      Acute on chronic combined systolic and diastolic CHF (congestive heart failure) ([place]) Plan as outlined above. Has pacemaker. Monitor electrolytes.     Acute on Chronic renal disease, stage III Her function improving after torsemide held. Cardiology recommended to hold it for another day.  COPD with chronic respiratory failure/black lung disease Currently maintaining O2 sat baseline. Continue inhalers Nebs when  necessary.  Anemia of chronic disease Slight drop but stable. Monitor.  Hypokalemia/hyperkalemia Replenished.  Hyponatremia Continue fluid resection. Holding torsemide. Recheck in a.m.  Goals of care Patient discontinued home hospice and wished to be fully treated. Currently DO NOT RESUSCITATE.       Code Status : DO NOT RESUSCITATE  Family Communication  : None at bedside. Will call daughter and updated.  Disposition Plan  : Home with home health possibly tomorrow if HAI resolved  and hyponatremia improves.  Barriers For Discharge : Improving symptoms  Consults  :   Cardiology Palliative care  Procedures  : None  DVT Prophylaxis  : eliquis  Lab Results  Component Value Date   PLT 162 08/10/2016    Antibiotics  :   Anti-infectives    None        Objective:   Vitals:   08/11/16 0535 08/11/16 1536 08/11/16 2129 08/12/16 0550  BP: 114/66 113/81 (!) 98/55 113/62  Pulse: (!) 134 63 63 69  Resp: 18 20    Temp: 97.4 F (36.3 C) 97.7 F (36.5 C) 97.5 F (36.4 C) 98.6 F (37 C)  TempSrc: Oral  Oral   SpO2: (!) 85% 100% 100% 100%  Weight: 53.4 kg (117 lb 11.2 oz)   56.2 kg (124 lb)  Height:        Wt Readings from Last 3 Encounters:  08/12/16 56.2 kg (124 lb)  07/26/16 51.6 kg (113 lb 11.2 oz)  07/06/16 61.2 kg (135 lb)     Intake/Output Summary (Last 24 hours) at 08/12/16 1335 Last data filed at 08/12/16 0552  Gross per 24 hour  Intake              358 ml  Output              900 ml  Net             -542 ml     Physical Exam Gen.: Elderly thin built male not in distress  HEENT: Moist mucosa, supple neck Chest: Clear bilaterally  CVS: S1 and S2 regular, no murmurs GI: Soft, nondistended, nontender Musculoskeletal: Warm, no edema       Data Review:    CBC  Recent Labs Lab 08/06/16 0526 08/08/16 0515 08/10/16 0657  WBC 3.8* 4.5 3.7*  HGB 8.1* 8.5* 10.3*  HCT 26.2* 26.7* 31.5*  PLT 123* 149* 162  MCV 97.8 98.5 96.9  MCH  30.2 31.4 31.7  MCHC 30.9 31.8 32.7  RDW 20.3* 20.7* 20.0*    Chemistries   Recent Labs Lab 08/06/16 0526 08/08/16 0515 08/10/16 0657 08/11/16 0540 08/12/16 0419  NA 131* 132* 129* 126* 126*  K 4.8 3.0* 5.0 5.3* 3.8  CL 93* 89* 87* 86* 86*  CO2 29 33* 31 28 31   GLUCOSE 95 114* 92 91 85  BUN 24* 24* 34* 37* 33*  CREATININE 1.12 1.19 1.35* 1.43* 1.24  CALCIUM 8.7* 8.9 9.5 9.2 9.1  AST 30 21 27   --   --   ALT 22 22 24   --   --   ALKPHOS 120 138* 138*  --   --   BILITOT 1.7* 1.0 1.2  --   --    ------------------------------------------------------------------------------------------------------------------ No results for input(s): CHOL, HDL, LDLCALC, TRIG, CHOLHDL, LDLDIRECT in the last 72 hours.  Lab Results  Component Value Date   HGBA1C 5.4 10/27/2015   ------------------------------------------------------------------------------------------------------------------ No results for input(s): TSH, T4TOTAL, T3FREE, THYROIDAB in the last 72 hours.  Invalid input(s): FREET3 ------------------------------------------------------------------------------------------------------------------ No results for input(s): VITAMINB12, FOLATE, FERRITIN, TIBC, IRON, RETICCTPCT in the last 72 hours.  Coagulation profile No results for input(s): INR, PROTIME in the last 168 hours.  No results for input(s): DDIMER in the last 72 hours.  Cardiac Enzymes No results for input(s): CKMB, TROPONINI, MYOGLOBIN in the last 168 hours.  Invalid input(s): CK ------------------------------------------------------------------------------------------------------------------    Component Value Date/Time   BNP 2,772.1 (H) 08/03/2016 1146    Inpatient Medications  Scheduled Meds: . allopurinol  300 mg Oral Daily  . apixaban  2.5 mg Oral BID  . isosorbide mononitrate  30 mg Oral Daily  . metoprolol succinate  25 mg Oral Daily  . pantoprazole  40 mg Oral Daily  . sodium chloride flush  3 mL  Intravenous Q12H  . vitamin B-12  1,000 mcg Oral BID   Continuous Infusions: . sodium chloride     PRN Meds:.sodium chloride, acetaminophen, albuterol, ALPRAZolam, nitroGLYCERIN, ondansetron (ZOFRAN) IV, ondansetron, sodium chloride flush  Micro Results No results found for this or any previous visit (from the past 240 hour(s)).  Radiology Reports Dg Chest 2 View  Result Date: 08/03/2016 CLINICAL DATA:  83 year old with bilateral lower extremity edema and shortness of breath which began 3 days ago. Current history of black lung disease. Personal history of prostate cancer and lung cancer. Oxygen dependent COPD. EXAM: CHEST  2 VIEW COMPARISON:  07/24/2016, 07/06/2016 and earlier, including CT chest 08/07/2013. FINDINGS: Sternotomy for CABG. Left subclavian biventricular pacing defibrillator unchanged and appears intact. Residual right subclavian ventricular lead unchanged. Cardiac silhouette moderately to markedly enlarged, unchanged. Thoracic aorta atherosclerotic, unchanged. Hilar and mediastinal contours otherwise unremarkable. Mild diffuse interstitial pulmonary edema and small bilateral pleural effusions, increased since the examination 10 days ago. No confluent airspace consolidation. Degenerative changes involving the thoracic and upper lumbar spine. IMPRESSION: Mild CHF and small bilateral pleural effusions, worse than on the examination 10 days ago. Electronically Signed   By: Evangeline Dakin M.D.   On: 08/03/2016 12:19   Dg Chest 2 View  Result Date: 07/24/2016 CLINICAL DATA:  Shortness of breath. EXAM: CHEST  2 VIEW COMPARISON:  July 06, 2016 FINDINGS: No pneumothorax. Cardiomegaly. Bilateral pulmonary opacities could represent developing infiltrate or edema. AICD device is identified. No other acute abnormalities are seen. IMPRESSION: Cardiomegaly. Bilateral pulmonary opacities. The pulmonary opacities are not specific based on imaging. History of shortness of breath with leg swelling  and no cough suggests edema. Developing infiltrate not excluded on this study. Recommend clinical correlation and follow-up to resolution. Electronically Signed   By: Dorise Bullion III M.D   On: 07/24/2016 17:33    Time Spent in minutes  25   Louellen Molder M.D on 08/12/2016 at 1:35 PM  Between 7am to 7pm - Pager - [phone]  After 7pm go to [email or web address] - password [facility]  [facility] -  Office  [phone]

## 2016-08-13 DIAGNOSIS — E871 Hypo-osmolality and hyponatremia: Secondary | ICD-10-CM | POA: Diagnosis not present

## 2016-08-13 LAB — BASIC METABOLIC PANEL
ANION GAP: 10 (ref 5–15)
BUN: 27 mg/dL — ABNORMAL HIGH (ref 6–20)
CHLORIDE: 90 mmol/L — AB (ref 101–111)
CO2: 30 mmol/L (ref 22–32)
CREATININE: 1.16 mg/dL (ref 0.61–1.24)
Calcium: 9 mg/dL (ref 8.9–10.3)
GFR calc non Af Amer: 57 mL/min — ABNORMAL LOW (ref >60)
Glucose, Bld: 98 mg/dL (ref 65–99)
Potassium: 4 mmol/L (ref 3.5–5.1)
SODIUM: 130 mmol/L — AB (ref 135–145)

## 2016-08-13 MED ORDER — TORSEMIDE 20 MG PO TABS: 20.0000 mg | ORAL_TABLET | Freq: Two times a day (BID) | ORAL | 0 refills | Status: DC

## 2016-08-13 NOTE — Consult Note (Signed)
   [facility] CM Inpatient Consult   08/13/2016  KADARRIUS YANKE February 11, 1933 [id number]     Made aware that Mr. Deroche will be discharged home today with home health by inpatient RNCM. Will alert [facility] RNCM.  Marthenia Rolling, MSN-Ed, RN,BSN [facility] Liaison [phone]

## 2016-08-13 NOTE — Progress Notes (Signed)
NURSING PROGRESS NOTE  [name] [id number] Discharge Data: 08/13/2016 2:28 PM Attending Provider: Louellen Molder, MD PCP:Kim, Jeneen Rinks, MD     George Ina to be D/C'd Home per MD order.  Discussed with the patient and patient's daughter the After Visit Summary and all questions fully answered. All IV's discontinued with no bleeding noted. All belongings returned to patient for patient to take home. Pt was sent home with a written prescription for torsemide.   Last Vital Signs:  Blood pressure 108/62, pulse 64, temperature 97.8 F (36.6 C), resp. rate 17, height 5\' 3"  (1.6 m), weight 56.5 kg (124 lb 9.6 oz), SpO2 100 %.  Discharge Medication List Allergies as of 08/13/2016      Reactions   [place] [digoxin] Other (See Comments)   Caused patient to have chest pains and dizzy spells   Aspirin Hives   Penicillins Rash   Rash Has patient had a PCN reaction causing immediate rash, facial/tongue/throat swelling, SOB or lightheadedness with hypotension:YES Has patient had a PCN reaction causing severe rash involving mucus membranes or skin necrosis: NO Has patient had a PCN reaction that required hospitalization NO Has patient had a PCN reaction occurring within the last 10 years:NO If all of the above answers are "NO", then may proceed with Cephalosporin use.      Medication List    STOP taking these medications   furosemide 40 MG tablet Commonly known as:  LASIX     TAKE these medications   albuterol (2.5 MG/3ML) 0.083% nebulizer solution Commonly known as:  PROVENTIL Take 2.5 mg by nebulization every 6 (six) hours as needed for wheezing or shortness of breath.   albuterol 108 (90 Base) MCG/ACT inhaler Commonly known as:  PROVENTIL HFA;VENTOLIN HFA Inhale 2 puffs into the lungs every 4 (four) hours as needed for wheezing or shortness of breath.   allopurinol 300 MG tablet Commonly known as:  ZYLOPRIM Take 300 mg by mouth daily.   CALCIUM-VITAMIN D PO Take 1 tablet by  mouth 2 (two) times daily.   ELIQUIS 5 MG Tabs tablet Generic drug:  apixaban Take 5 mg by mouth 2 (two) times daily.   feeding supplement (ENSURE ENLIVE) Liqd Take 237 mLs by mouth 2 (two) times daily between meals.   Fish Oil 1000 MG Caps Take 1,000 mg by mouth at bedtime.   hydrocortisone cream 1 % Apply 1 application topically 2 (two) times daily as needed for itching.   isosorbide mononitrate 30 MG 24 hr tablet Commonly known as:  IMDUR Take 30 mg by mouth daily.   metoprolol succinate 25 MG 24 hr tablet Commonly known as:  TOPROL-XL Take 25 mg by mouth daily.   nitroGLYCERIN 0.4 MG SL tablet Commonly known as:  NITROSTAT Place 0.4 mg under the tongue every 5 (five) minutes as needed for chest pain.   omeprazole 40 MG capsule Commonly known as:  PRILOSEC Take 1 capsule (40 mg total) by mouth 2 (two) times daily. What changed:  how much to take   ondansetron 4 MG disintegrating tablet Commonly known as:  ZOFRAN ODT Take 1 tablet (4 mg total) by mouth every 8 (eight) hours as needed for nausea or vomiting.   OXYGEN Inhale 2.5 L into the lungs continuous.   potassium chloride 10 MEQ tablet Commonly known as:  K-DUR,KLOR-CON Take 1 tablet (10 mEq total) by mouth daily.   torsemide 20 MG tablet Commonly known as:  DEMADEX Take 1 tablet (20 mg total) by mouth 2 (  two) times daily.   vitamin B-12 1000 MCG tablet Commonly known as:  CYANOCOBALAMIN Take 1,000 mcg by mouth 2 (two) times daily.            Durable Medical Equipment        Start     Ordered   08/13/16 1145  Heart failure home health orders  (Heart failure home health orders / Face to face)  Once    Comments:  Heart Failure Follow-up Care:  Verify follow-up appointments per Patient Discharge Instructions. Confirm transportation arranged. Reconcile home medications with discharge medication list. Remove discontinued medications from use. Assist patient/caregiver to manage medications using pill  box. Reinforce low sodium food selection Assessments: Vital signs and oxygen saturation at each visit. Assess home environment for safety concerns, caregiver support and availability of low-sodium foods. Consult Education officer, museum, PT/OT, Dietitian, and CNA based on assessments. Perform comprehensive cardiopulmonary assessment. Notify MD for any change in condition or weight gain of 3 pounds in one day or 5 pounds in one week with symptoms. Daily Weights and Symptom Monitoring: Ensure patient has access to scales. Teach patient/caregiver to weigh daily before breakfast and after voiding using same scale and record.    Teach patient/caregiver to track weight and symptoms and when to notify Provider. Activity: Develop individualized activity plan with patient/caregiver.   Question Answer Comment  Heart Failure Follow-up Care [facility] at [phone]   Obtain the following labs Basic Metabolic Panel   Lab frequency Weekly   Fax lab results to [facility] Clinic at [phone]   Diet Low Sodium Heart Healthy   Fluid restrictions: 1200 mL Fluid   Skilled Nurse to notify MD of weight trends weekly for first 2 weeks. May fax or call: [facility] at [phone] (fax) or [phone]      08/13/16 1145

## 2016-08-13 NOTE — Progress Notes (Signed)
PT Cancellation Note  Patient Details Name: [name] MRN: [id number] DOB: 1933-03-05   Cancelled Treatment:    Reason Eval/Treat Not Completed: Other (comment); reports already walking in room this AM.  States limited some due to SOB.  Also reports likely to d/c later today.  Will defer due to possible d/c and pt reports limited activity tolerance.     Reginia Naas 08/13/2016, 11:15 AM Magda Kiel, [place] 08/13/2016

## 2016-08-13 NOTE — Care Management Note (Addendum)
Case Management Note  Patient Details  Name: [name] MRN: [id number] Date of Birth: 14-Feb-1933  Subjective/Objective:            Admitted with acute on chronic respiratory failure.         Action/Plan: Plan is to d/c to home with home health services (RN, PT)/ HRI.  [facility] to follow  @ d/c.Portable oxygen tank left @ bedside for transportation to home . Daughter, Lattie Haw @ bedside and will take pt home once d/c.  Expected Discharge Date:  08/13/16               Expected Discharge Plan:  [place]  In-House Referral:     Discharge planning Services  CM Consult  Post Acute Care Choice:    Choice offered to:  Patient  DME Arranged:   ([place]) DME Agency:  [employer] [facility] [employer] referral made for portable tank for transportation to home with [place], [phone].  HH Arranged:  Therapist, sports, PT ([place]), SW( palliative care needs) [place] Agency:  [place]  Status of Service:  Completed, signed off  If discussed at [place] of Stay Meetings, dates discussed:    Additional Comments:  Sharin Mons, RN 08/13/2016, 12:28 PM

## 2016-08-13 NOTE — Progress Notes (Signed)
Progress Note  Patient Name: [name] Date of Encounter: 08/13/2016  Primary Cardiologist: Dr. Percival Spanish  Subjective   Breathing is short but baseline and he is anxious to go home  Inpatient Medications    Scheduled Meds: . allopurinol  300 mg Oral Daily  . apixaban  2.5 mg Oral BID  . isosorbide mononitrate  30 mg Oral Daily  . metoprolol succinate  25 mg Oral Daily  . pantoprazole  40 mg Oral Daily  . sodium chloride flush  3 mL Intravenous Q12H  . vitamin B-12  1,000 mcg Oral BID   Continuous Infusions: . sodium chloride     PRN Meds: sodium chloride, acetaminophen, albuterol, ALPRAZolam, nitroGLYCERIN, ondansetron (ZOFRAN) IV, ondansetron, sodium chloride flush   Vital Signs    Vitals:   08/12/16 0550 08/12/16 1412 08/12/16 2132 08/13/16 0511  BP: 113/62 110/67 (!) 107/56 118/67  Pulse: 69 66 63 72  Resp:  20  18  Temp: 98.6 F (37 C) 97.6 F (36.4 C) 97.8 F (36.6 C) 98.5 F (36.9 C)  TempSrc:    Oral  SpO2: 100% 100% 100% 100%  Weight: 124 lb (56.2 kg)   124 lb 9.6 oz (56.5 kg)  Height:        Intake/Output Summary (Last 24 hours) at 08/13/16 0750 Last data filed at 08/12/16 2102  Gross per 24 hour  Intake              650 ml  Output              600 ml  Net               50 ml   Filed Weights   08/11/16 0535 08/12/16 0550 08/13/16 0511  Weight: 117 lb 11.2 oz (53.4 kg) 124 lb (56.2 kg) 124 lb 9.6 oz (56.5 kg)    Telemetry    Ventricular pacing with PVCs - Personally Reviewed  ECG    NA - Personally Reviewed  Physical Exam   GEN: No acute distress.  Frail appearing Neck: No  JVD Cardiac: RRR, no murmurs, rubs, or gallops.  Respiratory: Clear  to auscultation bilaterally. GI: Soft, nontender, non-distended  MS: No  edema; No deformity. Neuro:  Nonfocal  Psych: Normal affect   Labs    Chemistry Recent Labs Lab 08/08/16 0515 08/10/16 0657 08/11/16 0540 08/12/16 0419 08/13/16 0525  NA 132* 129* 126* 126* 130*  K 3.0* 5.0  5.3* 3.8 4.0  CL 89* 87* 86* 86* 90*  CO2 33* 31 28 31 30   GLUCOSE 114* 92 91 85 98  BUN 24* 34* 37* 33* 27*  CREATININE 1.19 1.35* 1.43* 1.24 1.16  CALCIUM 8.9 9.5 9.2 9.1 9.0  PROT 7.1 8.0  --   --   --   ALBUMIN 2.8* 3.1*  --   --   --   AST 21 27  --   --   --   ALT 22 24  --   --   --   ALKPHOS 138* 138*  --   --   --   BILITOT 1.0 1.2  --   --   --   GFRNONAA 55* 47* 44* 52* 57*  GFRAA >60 55* 51* >60 >60  ANIONGAP 10 11 12 9 10      Hematology Recent Labs Lab 08/08/16 0515 08/10/16 0657  WBC 4.5 3.7*  RBC 2.71* 3.25*  HGB 8.5* 10.3*  HCT 26.7* 31.5*  MCV 98.5 96.9  MCH  31.4 31.7  MCHC 31.8 32.7  RDW 20.7* 20.0*  PLT 149* 162    Cardiac EnzymesNo results for input(s): TROPONINI in the last 168 hours. No results for input(s): TROPIPOC in the last 168 hours.   BNPNo results for input(s): BNP, PROBNP in the last 168 hours.   DDimer No results for input(s): DDIMER in the last 168 hours.   Radiology    No results found.  Cardiac Studies   NA  Patient Profile     83 y.o. male  with a hx of Chronic systolic CHF (last EF 88-89%), h/o CRT-D (with [employer] gen change 2015 to PACEMAKER ONLY), CAD s/p CABG 2004 chronic atrial fib, CKD III, hypotension, carotid artery disease, chronic respiratory failure on home O2, CVA, DM, gout, GERD, HTN, HLD, lung CA, prostate CA, bradycardia, possible GIB 11/6-11/12/2015 (xarelto held then restarted at DC), admit for LE ischemia s/p thombectomy 11/10-11/16/2017 (Xarelto changed to Eliquis), hyponatremia whom we were asked to see for CHF by Dr. Allyson Sabal  Assessment & Plan    ACUTE ON CHRONIC COMBINED SYSTOLIC AND DIASTOLIC HF:  Diuretics have been on hold.  Na is better today and creat is stable.  He is down 12 liters since admission.   Weight is down although not to previous baseline.  It is hard to know what the best dose of diuretic at home will be.  He was on Lasix at home discharge on Torsemide.  I would suggest 20 mg bid.  He will  need follow up this week in the [facility] and likely will need very frequent follow up.  He was a no show for a recent office visit with me.  Needs home palliative care.  He lives with his daughter  CHRONIC ATRIAL FIB:  On Eliquis.   HYPONATREMIA:  As above.   Improved but he will need frequent labs.   Signed, Minus Breeding, MD  08/13/2016, 7:50 AM

## 2016-08-13 NOTE — Discharge Summary (Signed)
Physician Discharge Summary  [name] ZHG:[id number] DOB: 12-04-1932 DOA: 08/03/2016  PCP: [name], MD  Admit date: 08/03/2016 Discharge date: 08/13/2016  Admitted From: Home Disposition: Home   Recommendations for Outpatient Follow-up:  1. Follow up in heart failure transition care clinic in one week. (Cardiology will arrange)  Home Health: RN and PT Equipment/Devices: Home oxygen (2-3 L continuously)  Discharge Condition: Guarded CODE STATUS: DO NOT RESUSCITATE Diet recommendation: Heart healthy with fluid restriction (1.2 L daily)     Discharge Diagnoses:  Principal Problem:   Acute on chronic respiratory failure (HCC)   Active Problems:   Acute on chronic combined systolic and diastolic CHF (congestive heart failure) (HCC)   Chronic a-fib (HCC)   Pacemaker   Black lung disease (HCC)   Anemia   Chronic renal disease, stage III   DM2 (diabetes mellitus, type 2) (HCC)   COPD exacerbation (HCC)   Palliative care by specialist   Hyponatremia    Brief narrative/history of present illness 83 year old male with severe ischemic cardiomyopathy with EF of 10-15%, status post pacemaker, hypertension or CAD status post CABG, A. fib, chronic respiratory failure due to COPD and black lung disease on home O2 (2.5 L) and diabetes mellitus type 2 who was on home hospice which he stopped recently wishing to undergo further treatment options. Patient presented to the ED with acute on chronic CHF exacerbation.  Hospital course  Principal Problem:   Acute on chronic hypoxic respiratory failure ([place]) Secondary to acute on chronic combined systolic and diastolic CHF. Has severe ischemic cardiomyopathy. Diuresing well. Held torsemide due to worsened renal function and hyponatremia for last 2 days. Both sodium and renal function have improved and cardiology recommends starting him on torsemide 20 mg twice daily and follow-up this week with cardiology. We'll arrange home health RN  and PT. Stable to be discharged home.   Active Problems:   Chronic a-fib (HCC) Continue eliquis and metoprolol.      Acute on chronic combined systolic and diastolic CHF (congestive heart failure) ([place]) Plan as outlined above. Has pacemaker. Stable electrolytes.     Acute on Chronic renal disease, stage III Renal function improved after torsemide was held.     COPD with chronic respiratory failure/black lung disease Currently maintaining O2 sat baseline. Continue home inhalers and nebs.  Anemia of chronic disease Stable.  Hypokalemia/hyperkalemia Replenished. Continue home potassium supplement.  Hyponatremia Held torsemide for overdiuresis. Continue fluid restriction. Improved in a.m. lab.  Goals of care Patient discontinued home hospice and wished to be fully treated. Currently DO NOT RESUSCITATE.     Family Communication  :  called daughter and updated on the phone  Disposition Plan  : Home with home health    Consults  :   Cardiology Palliative care  Procedures  : None   Discharge Instructions  Discharge Instructions    AMB Referral to [facility] Management    Complete by:  As directed    Reason for consult:  Post hospital monitoring - patient currently declining Hospice   Diagnoses of:  Heart Failure   Expected date of contact:  1-3 days (reserved for hospital discharges)   Please assign to community nurse [facility] Palliative Care notes] for consult. For questions, please contact:   Natividad Brood, RN BSN [facility] Liaison  [phone] business mobile phone Toll free office [phone]     Allergies as of 08/13/2016      Reactions   [place] [digoxin] Other (See Comments)  Caused patient to have chest pains and dizzy spells   Aspirin Hives   Penicillins Rash   Rash Has patient had a PCN reaction causing immediate rash, facial/tongue/throat swelling, SOB or lightheadedness with hypotension:YES Has patient  had a PCN reaction causing severe rash involving mucus membranes or skin necrosis: NO Has patient had a PCN reaction that required hospitalization NO Has patient had a PCN reaction occurring within the last 10 years:NO If all of the above answers are "NO", then may proceed with Cephalosporin use.      Medication List    STOP taking these medications   furosemide 40 MG tablet Commonly known as:  LASIX     TAKE these medications   albuterol (2.5 MG/3ML) 0.083% nebulizer solution Commonly known as:  PROVENTIL Take 2.5 mg by nebulization every 6 (six) hours as needed for wheezing or shortness of breath.   albuterol 108 (90 Base) MCG/ACT inhaler Commonly known as:  PROVENTIL HFA;VENTOLIN HFA Inhale 2 puffs into the lungs every 4 (four) hours as needed for wheezing or shortness of breath.   allopurinol 300 MG tablet Commonly known as:  ZYLOPRIM Take 300 mg by mouth daily.   CALCIUM-VITAMIN D PO Take 1 tablet by mouth 2 (two) times daily.   ELIQUIS 5 MG Tabs tablet Generic drug:  apixaban Take 5 mg by mouth 2 (two) times daily.   feeding supplement (ENSURE ENLIVE) Liqd Take 237 mLs by mouth 2 (two) times daily between meals.   Fish Oil 1000 MG Caps Take 1,000 mg by mouth at bedtime.   hydrocortisone cream 1 % Apply 1 application topically 2 (two) times daily as needed for itching.   isosorbide mononitrate 30 MG 24 hr tablet Commonly known as:  IMDUR Take 30 mg by mouth daily.   metoprolol succinate 25 MG 24 hr tablet Commonly known as:  TOPROL-XL Take 25 mg by mouth daily.   nitroGLYCERIN 0.4 MG SL tablet Commonly known as:  NITROSTAT Place 0.4 mg under the tongue every 5 (five) minutes as needed for chest pain.   omeprazole 40 MG capsule Commonly known as:  PRILOSEC Take 1 capsule (40 mg total) by mouth 2 (two) times daily. What changed:  how much to take   ondansetron 4 MG disintegrating tablet Commonly known as:  ZOFRAN ODT Take 1 tablet (4 mg total) by mouth  every 8 (eight) hours as needed for nausea or vomiting.   OXYGEN Inhale 2.5 L into the lungs continuous.   potassium chloride 10 MEQ tablet Commonly known as:  K-DUR,KLOR-CON Take 1 tablet (10 mEq total) by mouth daily.   torsemide 20 MG tablet Commonly known as:  DEMADEX Take 1 tablet (20 mg total) by mouth 2 (two) times daily.   vitamin B-12 1000 MCG tablet Commonly known as:  CYANOCOBALAMIN Take 1,000 mcg by mouth 2 (two) times daily.      Follow-up Information    Minus Breeding, MD Follow up in 1 week(s).   Specialty:  Cardiology Why:  heart failure transition care clinic Contact information: [address] [place] [address] [phone]          Allergies  Allergen Reactions  . [place] [Digoxin] Other (See Comments)    Caused patient to have chest pains and dizzy spells  . Aspirin Hives  . Penicillins Rash    Rash Has patient had a PCN reaction causing immediate rash, facial/tongue/throat swelling, SOB or lightheadedness with hypotension:YES Has patient had a PCN reaction causing severe rash involving  mucus membranes or skin necrosis: NO Has patient had a PCN reaction that required hospitalization NO Has patient had a PCN reaction occurring within the last 10 years:NO If all of the above answers are "NO", then may proceed with Cephalosporin use.       Procedures/Studies: Dg Chest 2 View  Result Date: 08/03/2016 CLINICAL DATA:  83 year old with bilateral lower extremity edema and shortness of breath which began 3 days ago. Current history of black lung disease. Personal history of prostate cancer and lung cancer. Oxygen dependent COPD. EXAM: CHEST  2 VIEW COMPARISON:  07/24/2016, 07/06/2016 and earlier, including CT chest 08/07/2013. FINDINGS: Sternotomy for CABG. Left subclavian biventricular pacing defibrillator unchanged and appears intact. Residual right subclavian ventricular lead unchanged. Cardiac silhouette moderately to markedly  enlarged, unchanged. Thoracic aorta atherosclerotic, unchanged. Hilar and mediastinal contours otherwise unremarkable. Mild diffuse interstitial pulmonary edema and small bilateral pleural effusions, increased since the examination 10 days ago. No confluent airspace consolidation. Degenerative changes involving the thoracic and upper lumbar spine. IMPRESSION: Mild CHF and small bilateral pleural effusions, worse than on the examination 10 days ago. Electronically Signed   By: Evangeline Dakin M.D.   On: 08/03/2016 12:19   Dg Chest 2 View  Result Date: 07/24/2016 CLINICAL DATA:  Shortness of breath. EXAM: CHEST  2 VIEW COMPARISON:  July 06, 2016 FINDINGS: No pneumothorax. Cardiomegaly. Bilateral pulmonary opacities could represent developing infiltrate or edema. AICD device is identified. No other acute abnormalities are seen. IMPRESSION: Cardiomegaly. Bilateral pulmonary opacities. The pulmonary opacities are not specific based on imaging. History of shortness of breath with leg swelling and no cough suggests edema. Developing infiltrate not excluded on this study. Recommend clinical correlation and follow-up to resolution. Electronically Signed   By: Dorise Bullion III M.D   On: 07/24/2016 17:33       Subjective: Feels his breathing to be better and wanting to go home.  Discharge Exam: Vitals:   08/13/16 0510 08/13/16 0511  BP: (!) 114/58 118/67  Pulse: 63 72  Resp: 18 18  Temp: 98 F (36.7 C) 98.5 F (36.9 C)   Vitals:   08/12/16 1412 08/12/16 2132 08/13/16 0510 08/13/16 0511  BP: 110/67 (!) 107/56 (!) 114/58 118/67  Pulse: 66 63 63 72  Resp: 20  18 18   Temp: 97.6 F (36.4 C) 97.8 F (36.6 C) 98 F (36.7 C) 98.5 F (36.9 C)  TempSrc:   Oral Oral  SpO2: 100% 100% 99% 100%  Weight:    56.5 kg (124 lb 9.6 oz)  Height:        Gen.: Elderly thin built male not in distress  HEENT: Moist mucosa, supple neck Chest: Clear bilaterally, no added sounds  CVS: S1 and S2 irregular, no  murmurs GI: Soft, nondistended, nontender Musculoskeletal: Warm, no edema    The results of significant diagnostics from this hospitalization (including imaging, microbiology, ancillary and laboratory) are listed below for reference.     Microbiology: No results found for this or any previous visit (from the past 240 hour(s)).   Labs: BNP (last 3 results)  Recent Labs  04/02/16 0925 07/24/16 2144 08/03/16 1146  BNP 3,007.2* 3,696.9* 2,585.2*   Basic Metabolic Panel:  Recent Labs Lab 08/08/16 0515 08/10/16 0657 08/11/16 0540 08/12/16 0419 08/13/16 0525  NA 132* 129* 126* 126* 130*  K 3.0* 5.0 5.3* 3.8 4.0  CL 89* 87* 86* 86* 90*  CO2 33* 31 28 31 30   GLUCOSE 114* 92 91 85 98  BUN 24*  34* 37* 33* 27*  CREATININE 1.19 1.35* 1.43* 1.24 1.16  CALCIUM 8.9 9.5 9.2 9.1 9.0   Liver Function Tests:  Recent Labs Lab 08/08/16 0515 08/10/16 0657  AST 21 27  ALT 22 24  ALKPHOS 138* 138*  BILITOT 1.0 1.2  PROT 7.1 8.0  ALBUMIN 2.8* 3.1*   No results for input(s): LIPASE, AMYLASE in the last 168 hours. No results for input(s): AMMONIA in the last 168 hours. CBC:  Recent Labs Lab 08/08/16 0515 08/10/16 0657  WBC 4.5 3.7*  HGB 8.5* 10.3*  HCT 26.7* 31.5*  MCV 98.5 96.9  PLT 149* 162   Cardiac Enzymes: No results for input(s): CKTOTAL, CKMB, CKMBINDEX, TROPONINI in the last 168 hours. BNP: Invalid input(s): POCBNP CBG: No results for input(s): GLUCAP in the last 168 hours. D-Dimer No results for input(s): DDIMER in the last 72 hours. Hgb A1c No results for input(s): HGBA1C in the last 72 hours. Lipid Profile No results for input(s): CHOL, HDL, LDLCALC, TRIG, CHOLHDL, LDLDIRECT in the last 72 hours. Thyroid function studies No results for input(s): TSH, T4TOTAL, T3FREE, THYROIDAB in the last 72 hours.  Invalid input(s): FREET3 Anemia work up No results for input(s): VITAMINB12, FOLATE, FERRITIN, TIBC, IRON, RETICCTPCT in the last 72  hours. Urinalysis    Component Value Date/Time   COLORURINE YELLOW 07/24/2016 2218   APPEARANCEUR CLEAR 07/24/2016 2218   LABSPEC 1.010 07/24/2016 2218   LABSPEC 1.015 05/03/2011 0950   PHURINE 5.0 07/24/2016 2218   GLUCOSEU NEGATIVE 07/24/2016 2218   HGBUR LARGE (A) 07/24/2016 2218   BILIRUBINUR NEGATIVE 07/24/2016 2218   BILIRUBINUR Color Interference 05/03/2011 0950   KETONESUR NEGATIVE 07/24/2016 2218   PROTEINUR NEGATIVE 07/24/2016 2218   UROBILINOGEN 0.2 11/10/2014 1815   NITRITE NEGATIVE 07/24/2016 2218   LEUKOCYTESUR NEGATIVE 07/24/2016 2218   LEUKOCYTESUR Color Interference 05/03/2011 0950   Sepsis Labs Invalid input(s): PROCALCITONIN,  WBC,  LACTICIDVEN Microbiology No results found for this or any previous visit (from the past 240 hour(s)).   Time coordinating discharge: Over 30 minutes  SIGNED:   Louellen Molder, MD  [facility] 08/13/2016, 11:34 AM Pager   If 7PM-7AM, please contact night-coverage [email or web address] Password [email or web address]

## 2016-08-15 DIAGNOSIS — J449 Chronic obstructive pulmonary disease, unspecified: Secondary | ICD-10-CM | POA: Diagnosis not present

## 2016-08-15 DIAGNOSIS — I110 Hypertensive heart disease with heart failure: Secondary | ICD-10-CM | POA: Diagnosis not present

## 2016-08-15 DIAGNOSIS — N183 Chronic kidney disease, stage 3 (moderate): Secondary | ICD-10-CM | POA: Diagnosis not present

## 2016-08-15 DIAGNOSIS — Z9981 Dependence on supplemental oxygen: Secondary | ICD-10-CM | POA: Diagnosis not present

## 2016-08-15 DIAGNOSIS — E1122 Type 2 diabetes mellitus with diabetic chronic kidney disease: Secondary | ICD-10-CM | POA: Diagnosis not present

## 2016-08-15 DIAGNOSIS — I5043 Acute on chronic combined systolic (congestive) and diastolic (congestive) heart failure: Secondary | ICD-10-CM | POA: Diagnosis not present

## 2016-08-15 DIAGNOSIS — D638 Anemia in other chronic diseases classified elsewhere: Secondary | ICD-10-CM | POA: Diagnosis not present

## 2016-08-15 DIAGNOSIS — E871 Hypo-osmolality and hyponatremia: Secondary | ICD-10-CM | POA: Diagnosis not present

## 2016-08-15 DIAGNOSIS — Z950 Presence of cardiac pacemaker: Secondary | ICD-10-CM | POA: Diagnosis not present

## 2016-08-15 DIAGNOSIS — J9620 Acute and chronic respiratory failure, unspecified whether with hypoxia or hypercapnia: Secondary | ICD-10-CM | POA: Diagnosis not present

## 2016-08-15 DIAGNOSIS — I255 Ischemic cardiomyopathy: Secondary | ICD-10-CM | POA: Diagnosis not present

## 2016-08-15 DIAGNOSIS — J628 Pneumoconiosis due to other dust containing silica: Secondary | ICD-10-CM | POA: Diagnosis not present

## 2016-08-15 DIAGNOSIS — I482 Chronic atrial fibrillation: Secondary | ICD-10-CM | POA: Diagnosis not present

## 2016-08-16 ENCOUNTER — Other Ambulatory Visit: Payer: Self-pay

## 2016-08-16 DIAGNOSIS — I10 Essential (primary) hypertension: Secondary | ICD-10-CM | POA: Diagnosis not present

## 2016-08-16 DIAGNOSIS — I5020 Unspecified systolic (congestive) heart failure: Secondary | ICD-10-CM | POA: Diagnosis not present

## 2016-08-16 DIAGNOSIS — Z0000 Encounter for general adult medical examination without abnormal findings: Secondary | ICD-10-CM | POA: Diagnosis not present

## 2016-08-16 DIAGNOSIS — E119 Type 2 diabetes mellitus without complications: Secondary | ICD-10-CM | POA: Diagnosis not present

## 2016-08-16 DIAGNOSIS — I480 Paroxysmal atrial fibrillation: Secondary | ICD-10-CM | POA: Diagnosis not present

## 2016-08-16 NOTE — Patient Outreach (Signed)
Transition of care: Vitals:   08/16/16 1355  Weight: 124 lb (56.2 kg)   Discharge date: 08/13/2016  Patient reports that he is home from the hospital. Reports no swelling, no shortness of breath.  States that he is home alone. Reports that his daughters are at work.  Reviewed with patient that he decided not to work with hospice again and he states that you will have to ask my daughters.  Attempted to review discharge medications and patient reports that he does not know what medications that he takes. States that I would have to talk to his daughter.  Reviewed low salt diet and fluid restriction and patient got mad and hung up. I called back and he states that he has not had time to start his low salt diet or fluid restriction.   Patient reports that he is not weighing daily because he does not want to wear out the scales. Reviewed importance of  Daily weights.   Reports that he has an appointment at the heart failure clinic tomorrow. Reports that a friend will take him.  PLAN: will continue weekly transition of care calls. Encouraged patient to weigh daily, follow low salt diet, follow fluid restriction and MD for any changes in condition.  Called daughter to confirm medications and left a message requesting a call back. Next outreach in 1 week.  Will route this note to MD. Will send barrier letter to MD.   [facility] CM Care Plan Problem One     Most Recent Value  Care Plan Problem One  Recent admission with CHF excerbation.   Role Documenting the Problem One  Care Management Brenda for Problem One  Active  [facility] Long Term Goal   Patient will report no readmissions related to heart failure in the next 60 days.  [facility] Long Term Goal Start Date  08/16/16  Interventions for Problem One Long Term Goal  Encouraged patient to weigh daily and call MD for weight gain.  reviewed importance of low salt diet and fluid restrictions.  [facility] CM Short Term Goal #1   Patient will weigh daily for the  next 30 days.   [facility] CM Short Term Goal #1 Start Date  08/16/16  Interventions for Short Term Goal #1  Reviewed with patient the importance of daily weights.  Reviewed heart failure zones.   [facility] CM Short Term Goal #2   Patient will attend follow up at the heart failure clinic in 2 days.   [facility] CM Short Term Goal #2 Start Date  08/16/16  Interventions for Short Term Goal #2  Reviewed importance of follow up with MD office. Encouraged patient to attend appointments .      [name], RN, BSN, CEN [facility] [employer] [phone]

## 2016-08-17 ENCOUNTER — Ambulatory Visit (INDEPENDENT_AMBULATORY_CARE_PROVIDER_SITE_OTHER): Payer: Medicare Other | Admitting: Internal Medicine

## 2016-08-17 ENCOUNTER — Ambulatory Visit (INDEPENDENT_AMBULATORY_CARE_PROVIDER_SITE_OTHER)
Admission: RE | Admit: 2016-08-17 | Discharge: 2016-08-17 | Disposition: A | Discharge status: Home / Self Care | Payer: Medicare Other | Source: Ambulatory Visit | Attending: Internal Medicine | Admitting: Internal Medicine

## 2016-08-17 ENCOUNTER — Encounter: Payer: Self-pay | Admitting: Internal Medicine

## 2016-08-17 VITALS — BP 112/60 | HR 61 | Ht 66.0 in | Wt 123.0 lb

## 2016-08-17 DIAGNOSIS — J60 Coalworker's pneumoconiosis: Secondary | ICD-10-CM

## 2016-08-17 DIAGNOSIS — J9611 Chronic respiratory failure with hypoxia: Secondary | ICD-10-CM | POA: Diagnosis not present

## 2016-08-17 DIAGNOSIS — R0609 Other forms of dyspnea: Secondary | ICD-10-CM | POA: Diagnosis not present

## 2016-08-17 DIAGNOSIS — R0602 Shortness of breath: Secondary | ICD-10-CM | POA: Diagnosis not present

## 2016-08-17 DIAGNOSIS — J449 Chronic obstructive pulmonary disease, unspecified: Secondary | ICD-10-CM

## 2016-08-17 NOTE — Patient Instructions (Addendum)
Please remember to go to the x-ray department downstairs in the basement  for your tests - we will call you with the results when they are available.    Be sure to call the number you were given for advice if you start having more fluid building in your legs or your weight starts rising more than 3 pounds    Plan A =  No never need for any Automatic medications  Plan B  Only use your albuterol as a rescue medication to be used if you can't catch your breath by resting or doing a relaxed purse lip breathing pattern.  - The less you use it, the better it will work when you need it. - Ok to use the inhaler up to 2 puffs  every 4 hours if you must but call for appointment if use goes up over your usual need - Don't leave home without it !!  (think of it like the spare tire for your car)   Plan C = Crisis - only use your albuterol nebulizer if you first try Plan B and it fails to help > ok to use the nebulizer up to every 4 hours but if start needing it regularly call for immediate appointment    Please remember to go to the x-ray department downstairs in the basement  for your tests - we will call you with the results when they are available.  Pulmoanry follow up is as needed

## 2016-08-17 NOTE — Progress Notes (Signed)
Spoke with pt and notified of results per Dr. Wert. Pt verbalized understanding and denied any questions. 

## 2016-08-17 NOTE — Progress Notes (Signed)
Subjective:     Patient ID: [name], male   DOB: [birth year],     MRN: [id number]  HPI  36 yowm quit smoking 1949 s/p admit    Admit date: 08/03/2016 Discharge date: 08/13/2016  Discharge Diagnoses:  Principal Problem:   Acute on chronic respiratory failure ([place])   Active Problems:   Acute on chronic combined systolic and diastolic CHF (congestive heart failure) (HCC)   Chronic a-fib (HCC)   Pacemaker   Black lung disease ([place])   Anemia   Chronic renal disease, stage III   DM2 (diabetes mellitus, type 2) (HCC)   COPD exacerbation ([place])   Palliative care by specialist   Hyponatremia    Brief narrative/history of present illness 83 year old male with severe ischemic cardiomyopathy with EF of 10-15%, status post pacemaker, hypertension or CAD status post CABG, A. fib, chronic respiratory failure due to COPD and black lung disease on home O2 (2.5 L) and diabetes mellitus type 2 who was on home hospice which he stopped recently wishing to undergo further treatment options. Patient presented to the ED with acute on chronic CHF exacerbation.  Hospital course  Principal Problem: Acute on chronic hypoxic respiratory failure ([place]) Secondary to acute on chronic combined systolic and diastolic CHF. Has severe ischemic cardiomyopathy. Diuresing well. Held torsemide due to worsened renal function and hyponatremia for last 2 days. Both sodium and renal function have improved and cardiology recommends starting him on torsemide 20 mg twice daily and follow-up this week with cardiology. We'll arrange home health RN and PT. Stable to be discharged home.   Active Problems: Chronic a-fib (HCC) Continue eliquis and metoprolol.    Acute on chronic combined systolic and diastolic CHF (congestive heart failure) ([place]) Plan as outlined above. Has pacemaker. Stable electrolytes.  Acute on Chronic renal disease, stage III Renal function improved after torsemide was held.      COPD with chronic respiratory failure/black lung disease Currently maintaining O2 sat baseline. Continue home inhalers and nebs.  Anemia of chronic disease Stable.  Hypokalemia/hyperkalemia Replenished. Continue home potassium supplement.  Hyponatremia Held torsemide for overdiuresis. Continue fluid restriction. Improved in a.m. lab.  Goals of care Patient discontinued home hospice and wished to be fully treated. Currently DO NOT RESUSCITATE.     08/17/2016 1st [facility] office visit/ Rayah Fines  Re GOLD I copd  Chief Complaint  Patient presents with  . Pulmonary Consult    Referred by hospitalist at [facility] for "check on my lungs". Pt states that he has o2 that he only uses why he is at home. He has albuterol that he rarely uses.       Baseline can walk at store but that was approx 6 m prior to [place]  - now very weak and debilitated and mostly stays home  Since d/c only able to get around house room to room s 02 and uses 02 at hs only  Has saba In multiple forms rarely if ever uses   No obvious day to day or daytime variability or assoc excess/ purulent sputum or mucus plugs or hemoptysis or cp or chest tightness, subjective wheeze or overt sinus or hb symptoms. No unusual exp hx or h/o childhood pna/ asthma or knowledge of premature birth.  Sleeping ok without nocturnal  or early am exacerbation  of respiratory  c/o's or need for noct saba. Also denies any obvious fluctuation of symptoms with weather or environmental changes or other aggravating or alleviating factors except as outlined above  Current Medications, Allergies, Complete Past Medical History, Past Surgical History, Family History, and Social History were reviewed in [employer] record.  ROS  The following are not active complaints unless bolded sore throat, dysphagia, dental problems, itching, sneezing,  nasal congestion or excess/ purulent secretions, ear ache,   fever, chills,  sweats, unintended wt loss, classically pleuritic or exertional cp,  orthopnea pnd or leg swelling, presyncope, palpitations, abdominal pain, anorexia, nausea, vomiting, diarrhea  or change in bowel or bladder habits, change in stools or urine, dysuria,hematuria,  rash, arthralgias, visual complaints, headache, numbness, weakness or ataxia or problems with walking or coordination,  change in mood/affect or memory.           Review of Systems     Objective:   Physical Exam  Amb wm could not stand from chair without assistance due to weak legs   Wt Readings from Last 3 Encounters:  08/17/16 123 lb (55.8 kg)  08/16/16 124 lb (56.2 kg)  08/13/16 124 lb 9.6 oz (56.5 kg)    Vital signs reviewed  - Note on arrival 02 sats  100% on RA     HEENT: nl dentition, turbinates bilaterally, and oropharynx. Nl external ear canals without cough reflex   NECK :  without JVD/Nodes/TM/ nl carotid upstrokes bilaterally   LUNGS: no acc muscle use,  Nl contour chest which is clear to A and P bilaterally without cough on insp or exp maneuvers   CV:  RRR  no s3 or murmur or increase in P2, and no edema   ABD:  soft and nontender with nl inspiratory excursion in the supine position. No bruits or organomegaly appreciated, bowel sounds nl  MS:  Nl gait/ ext warm without deformities, calf tenderness, cyanosis or clubbing No obvious joint restrictions   SKIN: warm and dry without lesions    NEURO:  alert, approp, nl sensorium with  no motor or cerebellar deficits apparent.    Labs ordered/ reviewed:      Chemistry      Component Value Date/Time   NA 130 (L) 08/13/2016 0525   K 4.0 08/13/2016 0525   CL 90 (L) 08/13/2016 0525   CO2 30 08/13/2016 0525   BUN 27 (H) 08/13/2016 0525   CREATININE 1.16 08/13/2016 0525   CREATININE 1.83 (H) 04/23/2016 1212      Component Value Date/Time   CALCIUM 9.0 08/13/2016 0525   ALKPHOS 138 (H) 08/10/2016 0657   AST 27 08/10/2016 0657   ALT 24 08/10/2016  0657   BILITOT 1.2 08/10/2016 0657        Lab Results  Component Value Date   WBC 3.7 (L) 08/10/2016   HGB 10.3 (L) 08/10/2016   HCT 31.5 (L) 08/10/2016   MCV 96.9 08/10/2016   PLT 162 08/10/2016        Lab Results  Component Value Date   TSH 4.655 (H) 10/27/2015            CXR PA and Lateral:   08/17/2016 :    I personally reviewed images and agree with radiology impression as follows:    Evidence of a degree of chronic congestive heart failure, stable. No airspace consolidation. Stable cardiac silhouette. Pacemaker leads unchanged. There is aortic atherosclerosis.    Assessment:

## 2016-08-18 DIAGNOSIS — D638 Anemia in other chronic diseases classified elsewhere: Secondary | ICD-10-CM | POA: Diagnosis not present

## 2016-08-18 DIAGNOSIS — E1122 Type 2 diabetes mellitus with diabetic chronic kidney disease: Secondary | ICD-10-CM | POA: Diagnosis not present

## 2016-08-18 DIAGNOSIS — N183 Chronic kidney disease, stage 3 (moderate): Secondary | ICD-10-CM | POA: Diagnosis not present

## 2016-08-18 DIAGNOSIS — Z9981 Dependence on supplemental oxygen: Secondary | ICD-10-CM | POA: Diagnosis not present

## 2016-08-18 DIAGNOSIS — I5043 Acute on chronic combined systolic (congestive) and diastolic (congestive) heart failure: Secondary | ICD-10-CM | POA: Diagnosis not present

## 2016-08-18 DIAGNOSIS — E871 Hypo-osmolality and hyponatremia: Secondary | ICD-10-CM | POA: Diagnosis not present

## 2016-08-18 DIAGNOSIS — I110 Hypertensive heart disease with heart failure: Secondary | ICD-10-CM | POA: Diagnosis not present

## 2016-08-18 DIAGNOSIS — J449 Chronic obstructive pulmonary disease, unspecified: Secondary | ICD-10-CM | POA: Insufficient documentation

## 2016-08-18 DIAGNOSIS — I482 Chronic atrial fibrillation: Secondary | ICD-10-CM | POA: Diagnosis not present

## 2016-08-18 DIAGNOSIS — J9620 Acute and chronic respiratory failure, unspecified whether with hypoxia or hypercapnia: Secondary | ICD-10-CM | POA: Diagnosis not present

## 2016-08-18 DIAGNOSIS — J628 Pneumoconiosis due to other dust containing silica: Secondary | ICD-10-CM | POA: Diagnosis not present

## 2016-08-18 DIAGNOSIS — Z950 Presence of cardiac pacemaker: Secondary | ICD-10-CM | POA: Diagnosis not present

## 2016-08-18 DIAGNOSIS — I255 Ischemic cardiomyopathy: Secondary | ICD-10-CM | POA: Diagnosis not present

## 2016-08-18 NOTE — Assessment & Plan Note (Addendum)
He carries this dx and very well could have mild CWP by cxr but it is a completely moot issue here which is often the case in pts with CWP as long as they don't have concurrent copd or heavy ongoing smoking hx

## 2016-08-18 NOTE — Assessment & Plan Note (Addendum)
Multifactorial but mostly chf/ geriactric decline with deconditioning and not copd (see separate a/p)   No pulmonary f/u needed > f/u cards/ Follow up per Primary Care planned    Note he seemed very shaky on details of care, very little insight "they come out and take care of me"   ? Who ?   A I don't know.   ? Do you have a number to call ?   A "I have it somewhere at home"  ? What would trigger you to call them   A I don't know.   Specifically advised re wt gain/ leg swelling and keep the phone number by the phone to call if change in status to prevent another trip to er.  Also rec he wear his 02 anytime he's sob or starting to swell.

## 2016-08-18 NOTE — Assessment & Plan Note (Addendum)
HC03  08/13/16 = 30 in setting of CRI  So may well be would trend higher with nl renal function   sats are fine today at rest and clear he is not 02 limited with activity at this point (since can barely stand up due to legs weak and sats don't drop from 100% RA that fast)  but should def continue 02 at hs and any time he feels sob or starting to retain fluid

## 2016-08-18 NOTE — Assessment & Plan Note (Signed)
Quit smoking in the 1950s Spirometry 08/17/2016  FEV1 1.45 (63%)  Ratio 72 with classic small airways curvature    As I explained to this patient in detail:  although there may be copd present, it does not appear to be clinically relevant and he does not actually meet the current criteria as he is a GOLD 0 :   it does not appear to be limiting activity tolerance any more than a set of worn tires limits someone from driving a car  around a parking lot.  A new set of Michelins might look good but would have no perceived impact on the performance of the car and would not be worth the cost.  That is to say:   this pt is so sedentary I don't recommend aggressive pulmonary rx at this point unless limiting symptoms arise or acute exacerbations become as issue, neither of which is the case now.  I asked the patient to contact this office at any time in the future should either of these problems arise.     Also note:   When respiratory symptoms begin or become refractory well after a patient reports complete smoking cessation,  Especially when this wasn't the case while they were smoking, a red flag is raised based on the work of Dr Kris Mouton which states:  if you quit smoking when your best day FEV1 is still well preserved it is highly unlikely you will progress to severe disease.  That is to say, once the smoking stops,  the symptoms should not suddenly erupt or markedly worsen years later.  If so, the differential diagnosis should include  obesity/deconditioning,  LPR/Reflux/Aspiration syndromes,  occult CHF, or  especially side effect of medications commonly used in this population.   Total time devoted to counseling  > 50 % of initial 60 min office visit:  review case with pt/fm discussion of options/alternatives/ personally creating written customized instructions  in presence of pt  then going over those specific  Instructions directly with the pt including how to use all of the meds but in particular  covering each new medication in detail and the difference between the maintenance= "automatic" meds and the prns using an action plan format for the latter (If this problem/symptom => do that organization reading Left to right).  Please see AVS from this visit for a full list of these instructions which I personally wrote for this pt and  are unique to this visit.

## 2016-08-20 ENCOUNTER — Ambulatory Visit (INDEPENDENT_AMBULATORY_CARE_PROVIDER_SITE_OTHER): Payer: Medicare Other | Admitting: Physician Assistant

## 2016-08-20 ENCOUNTER — Encounter: Payer: Self-pay | Admitting: Physician Assistant

## 2016-08-20 VITALS — BP 101/55 | HR 69 | Ht 66.0 in | Wt 125.8 lb

## 2016-08-20 DIAGNOSIS — N183 Chronic kidney disease, stage 3 unspecified: Secondary | ICD-10-CM

## 2016-08-20 DIAGNOSIS — I482 Chronic atrial fibrillation, unspecified: Secondary | ICD-10-CM

## 2016-08-20 DIAGNOSIS — Z79899 Other long term (current) drug therapy: Secondary | ICD-10-CM

## 2016-08-20 DIAGNOSIS — I10 Essential (primary) hypertension: Secondary | ICD-10-CM

## 2016-08-20 DIAGNOSIS — I25810 Atherosclerosis of coronary artery bypass graft(s) without angina pectoris: Secondary | ICD-10-CM | POA: Diagnosis not present

## 2016-08-20 DIAGNOSIS — I5022 Chronic systolic (congestive) heart failure: Secondary | ICD-10-CM | POA: Diagnosis not present

## 2016-08-20 DIAGNOSIS — E785 Hyperlipidemia, unspecified: Secondary | ICD-10-CM | POA: Diagnosis not present

## 2016-08-20 LAB — BASIC METABOLIC PANEL
BUN/Creatinine Ratio: 20 (ref 10–24)
BUN: 29 mg/dL — AB (ref 8–27)
CALCIUM: 9.3 mg/dL (ref 8.6–10.2)
CO2: 27 mmol/L (ref 20–29)
CREATININE: 1.43 mg/dL — AB (ref 0.76–1.27)
Chloride: 89 mmol/L — ABNORMAL LOW (ref 96–106)
GFR, EST AFRICAN AMERICAN: 52 mL/min/{1.73_m2} — AB (ref >59)
GFR, EST NON AFRICAN AMERICAN: 45 mL/min/{1.73_m2} — AB (ref >59)
Glucose: 97 mg/dL (ref 65–99)
Potassium: 4.5 mmol/L (ref 3.5–5.2)
Sodium: 132 mmol/L — ABNORMAL LOW (ref 134–144)

## 2016-08-20 NOTE — Progress Notes (Signed)
Cardiology Office Note    Date:  08/20/2016   ID:  [name], DOB 06-12-1932, MRN [id number]  PCP:  [name], MD  Cardiologist:  Dr. Percival Spanish Electrophysiologist: Dr. Lovena Le  Chief Complaint  Patient presents with  . Follow-up    seen for Dr. Percival Spanish    History of Present Illness:  [name] is a 83 y.o. male with PMH of chronic systolic HF (EF 38%) s/p CRT-D ([employer] gen change in 2015 to pacemaker only), CAD s/p CABG 2004, CKD stage III, chronic atrial fibrillation on eliquis, hypertension, hyperlipidemia, history of lung cancer and prostate cancer. Looking back, he had chronic systolic heart failure. Echocardiogram obtained in October 2014 showed EF 20-25%. This has decreased to 10-15% by echo in November 2017. He was admitted for COPD and CHF exacerbation in January 2018. Palliative care was consulted and he was made DO NOT RESUSCITATE. He was last seen in the office on 04/23/2016, he was hypotensive with blood pressure of 92/49. Patient appears to be very somnolent. After discussing with the family, home hospice was consulted as well. His spironolactone and Cozaar was stopped. He was recently readmitted in May 2018 with acute heart failure. Cardiology was consulted. It also appears despite previous discussion regarding palliative care approach, family and patient wishes to pursue continued aggressive management and "fired" hospice. He was discharged on torsemide to 20 mg twice a day. His discharge weight was 124 pounds.  He has been doing well since hospitalization. He has been seen by his primary care physician and also pulmonology service. I will repeat a basic metabolic panel to check his renal function and electrolyte. I have confirmed with the family they do not want hospice at this time. He is still DO NOT RESUSCITATE status. Otherwise, he has been urinating well on the current dose of diuretic. He denies any PND episodes since recent hospitalization. He cannot sleep flat,  however this is chronic and has not changed. Otherwise on physical exam, he does not have any lower extremity edema. His lung is clear. I will continue on the current dose of medication. I am unable to up titrate heart failure medication given borderline blood pressure. At this point, it is unlikely that his ejection fraction will have significant improvement.   Past Medical History:  Diagnosis Date  . Aortic stenosis   . Arthritis   . Black lung disease ([place])   . CAD (coronary artery disease)    a. 07/2002 CABG x 3: LIMA->LAD, VG->Diag, VG->OM;  b. 06/2006 Cath: LM 50-60ost/p, LAD patent mid stent, D1 sev dzs, D2 patent stent, LCX nl, OM2 sev sten prox, RCA large/nl, VG->Diag nl, VG->OM nl, LIMA->LAD atretic, EF 30%.  . Carotid artery occlusion   . Chronic atrial fibrillation ([place])   . Chronic respiratory failure ([place])   . Chronic systolic CHF (congestive heart failure) ([place])    a. 12/2012 Echo: EF 20-25%, mid-dist antsept AK, mod dil LA. b. Echo 01/2016: EF 10-15%, also severe RV dysfunction, mild AS (may be underrepresented).  . COPD (chronic obstructive pulmonary disease) ([place])    a. On home O2.  [name] CVA (cerebral vascular accident) ([place])   . Diabetes mellitus   . GERD (gastroesophageal reflux disease)   . GI bleed 01/2016  . Gout   . Hypercholesterolemia   . Hypertension   . Ischemia of lower extremity    a. s/p thrombectomy 01/2016.  . Ischemic cardiomyopathy    a. 06/2006  BIV-ICD -> gen change 2015  to PACEMAKER ONLY.  . Lung cancer ([place])   . Nephrolithiasis 09/2000  . On home oxygen therapy    "2.5L; 24/7" (07/25/2016)  . Prostate cancer ([place]) 11/01/10   gleason 7, 8, 9, gold seeds 02/08/11  . Symptomatic bradycardia     Past Surgical History:  Procedure Laterality Date  . BI-VENTRICULAR PACEMAKER INSERTION (CRT-P)  12-02-13   downgrade of previously implanted CRTD to STJ CRTP  . BIV PACEMAKER GENERATOR CHANGE OUT N/A 12/02/2013   Procedure: BIV PACEMAKER GENERATOR CHANGE OUT;   Surgeon: Evans Lance, MD;  Location: [facility] CATH LAB;  Service: Cardiovascular;  Laterality: N/A;  . CATARACT EXTRACTION W/ INTRAOCULAR LENS  IMPLANT, BILATERAL Bilateral   . COLONOSCOPY    . CORONARY ANGIOPLASTY WITH STENT PLACEMENT  07/1997; 08/1997;03/1998  . CORONARY ARTERY BYPASS GRAFT  07/2002   CABG X3  . ESOPHAGOGASTRODUODENOSCOPY  02/19/2011   Procedure: ESOPHAGOGASTRODUODENOSCOPY (EGD);  Surgeon: Jeryl Columbia, MD;  Location[facility] ENDOSCOPY;  Service: Endoscopy;  Laterality: N/A;  . ESOPHAGOGASTRODUODENOSCOPY    . FEMORAL-POPLITEAL BYPASS GRAFT Right 01/20/2016   Procedure: THROMBECTOMY OF POPLITEAL ARTERY AND ANTERIOR TIBIAL ARTERY RIGHT LEG; INTRAOPERATIVE ARTERIOGRAM;  Surgeon: Waynetta Sandy, MD;  Location: [place];  Service: Vascular;  Laterality: Right;  . INCISION AND DRAINAGE OF WOUND  08/2002   right thigh; S/P EVH  . INSERT / REPLACE / [place]; 1992; 01/2000;  . INSERT / REPLACE / REMOVE PACEMAKER  09/2003; 06/2006   w/AICD  . INSERT / REPLACE / REMOVE PACEMAKER  12/2004   pacmaker explant  . SHOULDER ARTHROSCOPY W/ ROTATOR CUFF REPAIR  05/2008   left    Current Medications: Outpatient Medications Prior to Visit  Medication Sig Dispense Refill  . albuterol (PROVENTIL HFA;VENTOLIN HFA) 108 (90 Base) MCG/ACT inhaler Inhale 2 puffs into the lungs every 4 (four) hours as needed for wheezing or shortness of breath. 1 Inhaler 2  . albuterol (PROVENTIL) (2.5 MG/3ML) 0.083% nebulizer solution Take 2.5 mg by nebulization every 6 (six) hours as needed for wheezing or shortness of breath.    . allopurinol (ZYLOPRIM) 300 MG tablet Take 300 mg by mouth daily.      [name] CALCIUM-VITAMIN D PO Take 1 tablet by mouth 2 (two) times daily.     [name] ELIQUIS 5 MG TABS tablet Take 5 mg by mouth 2 (two) times daily.     . feeding supplement, ENSURE ENLIVE, (ENSURE ENLIVE) LIQD Take 237 mLs by mouth 2 (two) times daily between meals. 237 mL 12  . hydrocortisone cream 1 % Apply 1  application topically 2 (two) times daily as needed for itching.    . isosorbide mononitrate (IMDUR) 30 MG 24 hr tablet Take 30 mg by mouth daily.    . metoprolol succinate (TOPROL-XL) 25 MG 24 hr tablet Take 25 mg by mouth daily.    . nitroGLYCERIN (NITROSTAT) 0.4 MG SL tablet Place 0.4 mg under the tongue every 5 (five) minutes as needed for chest pain.    . Omega-3 Fatty Acids (FISH OIL) 1000 MG CAPS Take 1,000 mg by mouth at bedtime.     [name] omeprazole (PRILOSEC) 40 MG capsule Take 1 capsule (40 mg total) by mouth 2 (two) times daily. (Patient taking differently: Take 20 mg by mouth 2 (two) times daily. ) 60 capsule 1  . ondansetron (ZOFRAN ODT) 4 MG disintegrating tablet Take 1 tablet (4 mg total) by mouth every 8 (eight) hours as needed for nausea or vomiting. 12  tablet 0  . OXYGEN Inhale 2.5 L into the lungs continuous.    . potassium chloride SA (K-DUR,KLOR-CON) 10 MEQ tablet Take 1 tablet (10 mEq total) by mouth daily. 30 tablet 0  . torsemide (DEMADEX) 20 MG tablet Take 1 tablet (20 mg total) by mouth 2 (two) times daily. 60 tablet 0  . vitamin B-12 (CYANOCOBALAMIN) 1000 MCG tablet Take 1,000 mcg by mouth 2 (two) times daily.     No facility-administered medications prior to visit.      Allergies:   [place] [digoxin]; Aspirin; and Penicillins   Social History   Social History  . Marital status: Widowed    Spouse name: N/A  . Number of children: N/A  . Years of education: N/A   Occupational History  . retired Retired    Equities trader   Social History Main Topics  . Smoking status: Former Smoker    Packs/day: 0.25    Types: Cigarettes    Quit date: 03/12/1949  . Smokeless tobacco: Current User    Types: Chew  . Alcohol use No     Comment: drank until per pt 03/19/11, h/o heavy use  . Drug use: No  . Sexual activity: Yes   Other Topics Concern  . None   Social History Narrative  . None     Family History:  The patient's family history includes Alzheimer's disease (age  of onset: 68) in his father; Cancer (age of onset: 58) in his father; Diabetes in his brother, brother, and brother; Diabetes (age of onset: 61) in his sister.   ROS:   Please see the history of present illness.    ROS All other systems reviewed and are negative.   PHYSICAL EXAM:   VS:  BP (!) 101/55   Pulse 69   Ht 5\' 6"  (1.676 m)   Wt 125 lb 12.8 oz (57.1 kg)   BMI 20.30 kg/m    GEN: Well nourished, well developed, in no acute distress  HEENT: normal  Neck: no JVD, carotid bruits, or masses Cardiac: RRR; no murmurs, rubs, or gallops,no edema  Respiratory:  clear to auscultation bilaterally, normal work of breathing GI: soft, nontender, nondistended, + BS MS: no deformity or atrophy  Skin: warm and dry, no rash Neuro:  Alert and Oriented x 3, Strength and sensation are intact Psych: euthymic mood, full affect  Wt Readings from Last 3 Encounters:  08/20/16 125 lb 12.8 oz (57.1 kg)  08/17/16 123 lb (55.8 kg)  08/16/16 124 lb (56.2 kg)      Studies/Labs Reviewed:   EKG:  EKG is not ordered today.    Recent Labs: 10/27/2015: TSH 4.655 01/19/2016: Magnesium 2.2 08/03/2016: B Natriuretic Peptide 2,772.1 08/10/2016: ALT 24; Hemoglobin 10.3; Platelets 162 08/13/2016: BUN 27; Creatinine, Ser 1.16; Potassium 4.0; Sodium 130   Lipid Panel    Component Value Date/Time   CHOL 159 10/27/2015 0515   TRIG 245 (H) 10/27/2015 0515   HDL 18 (L) 10/27/2015 0515   CHOLHDL 8.8 10/27/2015 0515   VLDL 49 (H) 10/27/2015 0515   LDLCALC 92 10/27/2015 0515    Additional studies/ records that were reviewed today include:   Echo 01/18/2016 LV EF: 10% -   15%  - Left ventricle: The cavity size was moderately dilated. Systolic   function was severely reduced. The estimated ejection fraction   was in the range of 10% to 15%. Diffuse hypokinesis. - Aortic valve: A bicuspid morphology cannot be excluded;   moderately thickened, moderately calcified  leaflets. Valve   mobility was restricted.  There was mild stenosis. There was no   regurgitation. Peak velocity (S): 220 cm/s. Valve area (VTI): 0.7   cm^2. Valve area (Vmax): 0.79 cm^2. Valve area (Vmean): 0.66   cm^2. - Mitral valve: Transvalvular velocity was within the normal range.   There was no evidence for stenosis. There was mild regurgitation. - Left atrium: The atrium was massively dilated. - Right ventricle: The cavity size was mildly dilated. Wall   thickness was normal. Systolic function was severely reduced. - Right atrium: The atrium was severely dilated. - Tricuspid valve: There was mild regurgitation. - Pulmonary arteries: Systolic pressure was within the normal   range. PA peak pressure: 18 mm Hg (S).  Impressions:  - Aortic stenosis is mild by mean gradient and peak velocity.   However, this is likely underestimated due to reduced systolic   function which can produce low flow, low gradient aortic   stenosis. Consider dobutamine stress echo if clinically   indicated.   ASSESSMENT:    1. Chronic systolic heart failure ([place])   2. Encounter for long-term (current) use of medications   3. Coronary artery disease involving coronary bypass graft of native heart without angina pectoris   4. CKD (chronic kidney disease), stage III   5. Chronic atrial fibrillation ([facility])   6. Essential hypertension   7. Hyperlipidemia, unspecified hyperlipidemia type      PLAN:  In order of problems listed above:  1. Chronic systolic heart failure: Appears to be euvolemic on physical exam. Home weight this morning was 123, recent discharge weight was 124 pounds. We will obtain basic metabolic panel today to make sure her renal function and potassium is stable. Continue carvedilol and Imdur, blood pressure too soft to add hydralazine. Looking back, his EF has been declining for the past several years. EF was 20-25% in 2014, recent echocardiogram this year showed EF is down to 10-15%. Given history of significant coronary  artery disease, very unlikely to reverse at this point. Family does not want hospice at this point. Otherwise he seems to be very stable for heart failure perspective. Diuretic was recently changed from 40 mg daily of Lasix to 20 mg twice a day of torsemide.  2. CAD s/p CABG: No obvious chest pain.   3. CKD stage III: Basic metabolic panel today to monitor renal function and potassium given increased diuretic recently.  4. Chronic atrial fibrillation on eliquis: Recent hospital records showed some degree of anemia, hemoglobin has improved prior to hospital discharge. Will defer to primary care physician to continue to monitor hemoglobin. He has not had any bleeding issues. On physical exam, his heart rate is actually very regular despite history of chronic atrial fibrillation, this is likely the result of his ventricular rate being paced   5. Hypertension: Blood pressure borderline today, we'll need to monitor blood pressure and potentially cut back on Toprol-XL and Imdur if blood pressure drops again.  6. Hyperlipidemia: I have not seen on lipid panel since August 2017. At that time his triglyceride was elevated. Will defer to primary care physician. May consider fish oil, however would not try to be too aggressive by this point, as it is unlikely to improve his long term prognosis.    Medication Adjustments/Labs and Tests Ordered: Current medicines are reviewed at length with the patient today.  Concerns regarding medicines are outlined above.  Medication changes, Labs and Tests ordered today are listed in the Patient Instructions below. Patient  Instructions  Medication Instructions:   No changes  Labwork:   BMET today   Testing/Procedures:   Follow-Up:  2 months with Dr. Percival Spanish   If you need a refill on your cardiac medications before your next appointment, please call your pharmacy.      Hilbert Corrigan, [place]  08/20/2016 9:04 AM    [name], [place], [place]  [address] Phone: [phone]; Fax: [phone]

## 2016-08-20 NOTE — Patient Instructions (Signed)
Medication Instructions:   No changes  Labwork:   BMET today   Testing/Procedures:   Follow-Up:  2 months with Dr. Percival Spanish   If you need a refill on your cardiac medications before your next appointment, please call your pharmacy.

## 2016-08-22 ENCOUNTER — Telehealth: Payer: Self-pay | Admitting: *Deleted

## 2016-08-22 DIAGNOSIS — I482 Chronic atrial fibrillation: Secondary | ICD-10-CM | POA: Diagnosis not present

## 2016-08-22 DIAGNOSIS — I255 Ischemic cardiomyopathy: Secondary | ICD-10-CM | POA: Diagnosis not present

## 2016-08-22 DIAGNOSIS — Z79899 Other long term (current) drug therapy: Secondary | ICD-10-CM

## 2016-08-22 DIAGNOSIS — J9620 Acute and chronic respiratory failure, unspecified whether with hypoxia or hypercapnia: Secondary | ICD-10-CM | POA: Diagnosis not present

## 2016-08-22 DIAGNOSIS — I110 Hypertensive heart disease with heart failure: Secondary | ICD-10-CM | POA: Diagnosis not present

## 2016-08-22 DIAGNOSIS — J628 Pneumoconiosis due to other dust containing silica: Secondary | ICD-10-CM | POA: Diagnosis not present

## 2016-08-22 DIAGNOSIS — Z9981 Dependence on supplemental oxygen: Secondary | ICD-10-CM | POA: Diagnosis not present

## 2016-08-22 DIAGNOSIS — N183 Chronic kidney disease, stage 3 (moderate): Secondary | ICD-10-CM | POA: Diagnosis not present

## 2016-08-22 DIAGNOSIS — D638 Anemia in other chronic diseases classified elsewhere: Secondary | ICD-10-CM | POA: Diagnosis not present

## 2016-08-22 DIAGNOSIS — J449 Chronic obstructive pulmonary disease, unspecified: Secondary | ICD-10-CM | POA: Diagnosis not present

## 2016-08-22 DIAGNOSIS — E1122 Type 2 diabetes mellitus with diabetic chronic kidney disease: Secondary | ICD-10-CM | POA: Diagnosis not present

## 2016-08-22 DIAGNOSIS — Z950 Presence of cardiac pacemaker: Secondary | ICD-10-CM | POA: Diagnosis not present

## 2016-08-22 DIAGNOSIS — E871 Hypo-osmolality and hyponatremia: Secondary | ICD-10-CM | POA: Diagnosis not present

## 2016-08-22 DIAGNOSIS — I5043 Acute on chronic combined systolic (congestive) and diastolic (congestive) heart failure: Secondary | ICD-10-CM | POA: Diagnosis not present

## 2016-08-22 NOTE — Telephone Encounter (Signed)
-----   Message from [place], [place] sent at 08/21/2016  1:19 PM EDT ----- Kidney function worsened slightly, recommend continue on torsemide and repeat BMET in a week.

## 2016-08-22 NOTE — Telephone Encounter (Signed)
Results and recommendations discussed with patient, who verbalized understanding and thanks. bmet order entered for draw next week.

## 2016-08-23 ENCOUNTER — Other Ambulatory Visit: Payer: Self-pay | Admitting: *Deleted

## 2016-08-23 ENCOUNTER — Ambulatory Visit: Payer: Self-pay

## 2016-08-23 NOTE — Patient Outreach (Signed)
[facility]) Care Management  08/23/2016  [name] 1932-08-10 [id number]   RN coverage for [name] Rand, RN for a transition of care call.  RN spoke with pt today and introduce case manager and purpose for today's call. Verified identifiers and inquired on pt's daily weighs related to his HF. Pt states his weight yesterday was 121 lbs and yesterday 126 lbs with no reported symptoms of SOB or swelling to his extremities. Pt indicates he has an appointment on Monday. RN verified pt continues adherence his medications however pt has been non-adherent in the past. Pt also states he has attended all his medical appointments with sufficient transportation between his two daughter and a supportive neighbor.   Based upon the above information RN requested to contact pt's primary provided. RN requested pt to respond to a possible call back from his provider (pt receptive). RN spoke with [name] (reception) at the provider's [name] who took note for there triage nurse concerning the above assessment. RN requested the [name] to contact the pt directly with any interventions however if this RN needed also left contact information.   Will report [name] Rand, RN concerning update on this pt for ongoing follow up and transition of care calls.  [name] Mina, RN Care Management Coordinator [name] [phone]

## 2016-08-24 DIAGNOSIS — I5043 Acute on chronic combined systolic (congestive) and diastolic (congestive) heart failure: Secondary | ICD-10-CM | POA: Diagnosis not present

## 2016-08-24 DIAGNOSIS — E1122 Type 2 diabetes mellitus with diabetic chronic kidney disease: Secondary | ICD-10-CM | POA: Diagnosis not present

## 2016-08-24 DIAGNOSIS — I110 Hypertensive heart disease with heart failure: Secondary | ICD-10-CM | POA: Diagnosis not present

## 2016-08-24 DIAGNOSIS — Z950 Presence of cardiac pacemaker: Secondary | ICD-10-CM | POA: Diagnosis not present

## 2016-08-24 DIAGNOSIS — Z9981 Dependence on supplemental oxygen: Secondary | ICD-10-CM | POA: Diagnosis not present

## 2016-08-24 DIAGNOSIS — I482 Chronic atrial fibrillation: Secondary | ICD-10-CM | POA: Diagnosis not present

## 2016-08-24 DIAGNOSIS — J449 Chronic obstructive pulmonary disease, unspecified: Secondary | ICD-10-CM | POA: Diagnosis not present

## 2016-08-24 DIAGNOSIS — J9620 Acute and chronic respiratory failure, unspecified whether with hypoxia or hypercapnia: Secondary | ICD-10-CM | POA: Diagnosis not present

## 2016-08-24 DIAGNOSIS — I255 Ischemic cardiomyopathy: Secondary | ICD-10-CM | POA: Diagnosis not present

## 2016-08-24 DIAGNOSIS — N183 Chronic kidney disease, stage 3 (moderate): Secondary | ICD-10-CM | POA: Diagnosis not present

## 2016-08-24 DIAGNOSIS — D638 Anemia in other chronic diseases classified elsewhere: Secondary | ICD-10-CM | POA: Diagnosis not present

## 2016-08-24 DIAGNOSIS — J628 Pneumoconiosis due to other dust containing silica: Secondary | ICD-10-CM | POA: Diagnosis not present

## 2016-08-24 DIAGNOSIS — E871 Hypo-osmolality and hyponatremia: Secondary | ICD-10-CM | POA: Diagnosis not present

## 2016-08-27 ENCOUNTER — Encounter: Payer: Medicare Other | Admitting: *Deleted

## 2016-08-27 ENCOUNTER — Telehealth: Payer: Self-pay | Admitting: Cardiology

## 2016-08-27 DIAGNOSIS — Z950 Presence of cardiac pacemaker: Secondary | ICD-10-CM | POA: Diagnosis not present

## 2016-08-27 DIAGNOSIS — E1122 Type 2 diabetes mellitus with diabetic chronic kidney disease: Secondary | ICD-10-CM | POA: Diagnosis not present

## 2016-08-27 DIAGNOSIS — I255 Ischemic cardiomyopathy: Secondary | ICD-10-CM | POA: Diagnosis not present

## 2016-08-27 DIAGNOSIS — E871 Hypo-osmolality and hyponatremia: Secondary | ICD-10-CM | POA: Diagnosis not present

## 2016-08-27 DIAGNOSIS — N183 Chronic kidney disease, stage 3 (moderate): Secondary | ICD-10-CM | POA: Diagnosis not present

## 2016-08-27 DIAGNOSIS — I5043 Acute on chronic combined systolic (congestive) and diastolic (congestive) heart failure: Secondary | ICD-10-CM | POA: Diagnosis not present

## 2016-08-27 DIAGNOSIS — J449 Chronic obstructive pulmonary disease, unspecified: Secondary | ICD-10-CM | POA: Diagnosis not present

## 2016-08-27 DIAGNOSIS — I110 Hypertensive heart disease with heart failure: Secondary | ICD-10-CM | POA: Diagnosis not present

## 2016-08-27 DIAGNOSIS — J628 Pneumoconiosis due to other dust containing silica: Secondary | ICD-10-CM | POA: Diagnosis not present

## 2016-08-27 DIAGNOSIS — I482 Chronic atrial fibrillation: Secondary | ICD-10-CM | POA: Diagnosis not present

## 2016-08-27 DIAGNOSIS — J9620 Acute and chronic respiratory failure, unspecified whether with hypoxia or hypercapnia: Secondary | ICD-10-CM | POA: Diagnosis not present

## 2016-08-27 DIAGNOSIS — Z9981 Dependence on supplemental oxygen: Secondary | ICD-10-CM | POA: Diagnosis not present

## 2016-08-27 DIAGNOSIS — Z79899 Other long term (current) drug therapy: Secondary | ICD-10-CM | POA: Diagnosis not present

## 2016-08-27 DIAGNOSIS — D638 Anemia in other chronic diseases classified elsewhere: Secondary | ICD-10-CM | POA: Diagnosis not present

## 2016-08-27 LAB — BASIC METABOLIC PANEL
BUN / CREAT RATIO: 22 (ref 10–24)
BUN: 30 mg/dL — AB (ref 8–27)
CHLORIDE: 93 mmol/L — AB (ref 96–106)
CO2: 26 mmol/L (ref 20–29)
Calcium: 9.1 mg/dL (ref 8.6–10.2)
Creatinine, Ser: 1.36 mg/dL — ABNORMAL HIGH (ref 0.76–1.27)
GFR calc Af Amer: 56 mL/min/{1.73_m2} — ABNORMAL LOW (ref >59)
GFR calc non Af Amer: 48 mL/min/{1.73_m2} — ABNORMAL LOW (ref >59)
GLUCOSE: 86 mg/dL (ref 65–99)
Potassium: 4.3 mmol/L (ref 3.5–5.2)
Sodium: 137 mmol/L (ref 134–144)

## 2016-08-27 NOTE — Telephone Encounter (Signed)
Spoke with pt and reminded pt of remote transmission that is due today. Pt verbalized understanding.   

## 2016-08-29 DIAGNOSIS — J628 Pneumoconiosis due to other dust containing silica: Secondary | ICD-10-CM | POA: Diagnosis not present

## 2016-08-29 DIAGNOSIS — I110 Hypertensive heart disease with heart failure: Secondary | ICD-10-CM | POA: Diagnosis not present

## 2016-08-29 DIAGNOSIS — E1122 Type 2 diabetes mellitus with diabetic chronic kidney disease: Secondary | ICD-10-CM | POA: Diagnosis not present

## 2016-08-29 DIAGNOSIS — J449 Chronic obstructive pulmonary disease, unspecified: Secondary | ICD-10-CM | POA: Diagnosis not present

## 2016-08-29 DIAGNOSIS — E871 Hypo-osmolality and hyponatremia: Secondary | ICD-10-CM | POA: Diagnosis not present

## 2016-08-29 DIAGNOSIS — J9620 Acute and chronic respiratory failure, unspecified whether with hypoxia or hypercapnia: Secondary | ICD-10-CM | POA: Diagnosis not present

## 2016-08-29 DIAGNOSIS — N183 Chronic kidney disease, stage 3 (moderate): Secondary | ICD-10-CM | POA: Diagnosis not present

## 2016-08-29 DIAGNOSIS — I255 Ischemic cardiomyopathy: Secondary | ICD-10-CM | POA: Diagnosis not present

## 2016-08-29 DIAGNOSIS — Z950 Presence of cardiac pacemaker: Secondary | ICD-10-CM | POA: Diagnosis not present

## 2016-08-29 DIAGNOSIS — I482 Chronic atrial fibrillation: Secondary | ICD-10-CM | POA: Diagnosis not present

## 2016-08-29 DIAGNOSIS — I5043 Acute on chronic combined systolic (congestive) and diastolic (congestive) heart failure: Secondary | ICD-10-CM | POA: Diagnosis not present

## 2016-08-29 DIAGNOSIS — D638 Anemia in other chronic diseases classified elsewhere: Secondary | ICD-10-CM | POA: Diagnosis not present

## 2016-08-29 DIAGNOSIS — Z9981 Dependence on supplemental oxygen: Secondary | ICD-10-CM | POA: Diagnosis not present

## 2016-08-30 ENCOUNTER — Other Ambulatory Visit: Payer: Self-pay

## 2016-08-30 ENCOUNTER — Ambulatory Visit: Payer: Medicare Other

## 2016-08-30 NOTE — Patient Outreach (Signed)
Transition of care: Placed call to patient who answered. He reports I am okay.  Reports no swelling. Patient different to engage in conversation via phone.   States that his weight is unchanged.  Offered home visit for this week and patient reports that he has home health coming to see him.  Reports that I can come see him next week.  PLAN: home visit planned for Tuesday June 26th at 10 am. Confirmed address.  [name], RN, BSN, CEN [facility] [employer] [phone]

## 2016-08-31 ENCOUNTER — Encounter: Payer: Self-pay | Admitting: Cardiology

## 2016-08-31 DIAGNOSIS — I5043 Acute on chronic combined systolic (congestive) and diastolic (congestive) heart failure: Secondary | ICD-10-CM | POA: Diagnosis not present

## 2016-08-31 DIAGNOSIS — D638 Anemia in other chronic diseases classified elsewhere: Secondary | ICD-10-CM | POA: Diagnosis not present

## 2016-08-31 DIAGNOSIS — I482 Chronic atrial fibrillation: Secondary | ICD-10-CM | POA: Diagnosis not present

## 2016-08-31 DIAGNOSIS — N183 Chronic kidney disease, stage 3 (moderate): Secondary | ICD-10-CM | POA: Diagnosis not present

## 2016-08-31 DIAGNOSIS — J628 Pneumoconiosis due to other dust containing silica: Secondary | ICD-10-CM | POA: Diagnosis not present

## 2016-08-31 DIAGNOSIS — J9620 Acute and chronic respiratory failure, unspecified whether with hypoxia or hypercapnia: Secondary | ICD-10-CM | POA: Diagnosis not present

## 2016-08-31 DIAGNOSIS — E871 Hypo-osmolality and hyponatremia: Secondary | ICD-10-CM | POA: Diagnosis not present

## 2016-08-31 DIAGNOSIS — E1122 Type 2 diabetes mellitus with diabetic chronic kidney disease: Secondary | ICD-10-CM | POA: Diagnosis not present

## 2016-08-31 DIAGNOSIS — I255 Ischemic cardiomyopathy: Secondary | ICD-10-CM | POA: Diagnosis not present

## 2016-08-31 DIAGNOSIS — Z950 Presence of cardiac pacemaker: Secondary | ICD-10-CM | POA: Diagnosis not present

## 2016-08-31 DIAGNOSIS — I110 Hypertensive heart disease with heart failure: Secondary | ICD-10-CM | POA: Diagnosis not present

## 2016-08-31 DIAGNOSIS — J449 Chronic obstructive pulmonary disease, unspecified: Secondary | ICD-10-CM | POA: Diagnosis not present

## 2016-08-31 DIAGNOSIS — Z9981 Dependence on supplemental oxygen: Secondary | ICD-10-CM | POA: Diagnosis not present

## 2016-09-03 DIAGNOSIS — E119 Type 2 diabetes mellitus without complications: Secondary | ICD-10-CM | POA: Diagnosis not present

## 2016-09-03 DIAGNOSIS — I5022 Chronic systolic (congestive) heart failure: Secondary | ICD-10-CM | POA: Diagnosis not present

## 2016-09-03 DIAGNOSIS — I480 Paroxysmal atrial fibrillation: Secondary | ICD-10-CM | POA: Diagnosis not present

## 2016-09-04 ENCOUNTER — Other Ambulatory Visit: Payer: Self-pay

## 2016-09-04 DIAGNOSIS — I110 Hypertensive heart disease with heart failure: Secondary | ICD-10-CM | POA: Diagnosis not present

## 2016-09-04 DIAGNOSIS — I482 Chronic atrial fibrillation: Secondary | ICD-10-CM | POA: Diagnosis not present

## 2016-09-04 DIAGNOSIS — J9620 Acute and chronic respiratory failure, unspecified whether with hypoxia or hypercapnia: Secondary | ICD-10-CM | POA: Diagnosis not present

## 2016-09-04 DIAGNOSIS — J628 Pneumoconiosis due to other dust containing silica: Secondary | ICD-10-CM | POA: Diagnosis not present

## 2016-09-04 DIAGNOSIS — J449 Chronic obstructive pulmonary disease, unspecified: Secondary | ICD-10-CM | POA: Diagnosis not present

## 2016-09-04 DIAGNOSIS — Z9981 Dependence on supplemental oxygen: Secondary | ICD-10-CM | POA: Diagnosis not present

## 2016-09-04 DIAGNOSIS — N183 Chronic kidney disease, stage 3 (moderate): Secondary | ICD-10-CM | POA: Diagnosis not present

## 2016-09-04 DIAGNOSIS — Z950 Presence of cardiac pacemaker: Secondary | ICD-10-CM | POA: Diagnosis not present

## 2016-09-04 DIAGNOSIS — E871 Hypo-osmolality and hyponatremia: Secondary | ICD-10-CM | POA: Diagnosis not present

## 2016-09-04 DIAGNOSIS — E1122 Type 2 diabetes mellitus with diabetic chronic kidney disease: Secondary | ICD-10-CM | POA: Diagnosis not present

## 2016-09-04 DIAGNOSIS — I255 Ischemic cardiomyopathy: Secondary | ICD-10-CM | POA: Diagnosis not present

## 2016-09-04 DIAGNOSIS — D638 Anemia in other chronic diseases classified elsewhere: Secondary | ICD-10-CM | POA: Diagnosis not present

## 2016-09-04 DIAGNOSIS — I5043 Acute on chronic combined systolic (congestive) and diastolic (congestive) heart failure: Secondary | ICD-10-CM | POA: Diagnosis not present

## 2016-09-04 NOTE — Patient Outreach (Signed)
[facility]) Care Management   09/04/2016  [name] 1932/09/02 [id number]  [name] is an 83 y.o. male Arrived for home visit. Patient home alone with his dogs. Subjective: Patient reports that he is doing well. Reports that he is active with home health( advanced) and that he sees a nurse 2 times per week.  Patient reports that he is weighing daily.  Reports that his daughters are in and out and take care of his medications.  Patient states that he is not following his low salt diet. Reports that he does not eat much. Patient reports that he saw his primary MD yesterday and everything is going well.   Objective:  Vitals:   09/04/16 1004  BP: 106/60  Pulse: 66  Resp: 20  SpO2: 97%  Weight: 126 lb 6.4 oz (57.3 kg)  Height: 1.676 m (_0 )   Patient looks much stronger than he did when I saw him in January. Awake and alert. Ambulating to the door without oxygen or any assistive devices.  Home health nurse arrived during home visit.  States they have 9 more visits until the end of July.  Review of Systems  HENT: Negative.   Eyes: Negative.   Respiratory: Negative for wheezing.   Cardiovascular: Negative.   Gastrointestinal: Negative.   Genitourinary: Positive for frequency.  Musculoskeletal: Negative.   Skin: Negative.   Neurological: Positive for weakness.  Endo/Heme/Allergies: Negative.   Psychiatric/Behavioral: The patient has insomnia.     Physical Exam  Constitutional: He is oriented to person, place, and time. He appears well-developed and well-nourished.  Cardiovascular: Normal rate, normal heart sounds and intact distal pulses.   Respiratory: Effort normal and breath sounds normal.  GI: Soft. Bowel sounds are normal.  Musculoskeletal: Normal range of motion. He exhibits edema.  Trace edema  Neurological: He is alert and oriented to person, place, and time.  Skin: Skin is warm and dry.  Psychiatric: He has a normal mood and affect. His  behavior is normal. Judgment and thought content normal.    Encounter Medications:   Outpatient Encounter Prescriptions as of 09/04/2016  Medication Sig Note  . albuterol (PROVENTIL HFA;VENTOLIN HFA) 108 (90 Base) MCG/ACT inhaler Inhale 2 puffs into the lungs every 4 (four) hours as needed for wheezing or shortness of breath.   [name] albuterol (PROVENTIL) (2.5 MG/3ML) 0.083% nebulizer solution Take 2.5 mg by nebulization every 6 (six) hours as needed for wheezing or shortness of breath.   . allopurinol (ZYLOPRIM) 300 MG tablet Take 300 mg by mouth daily.     [name] CALCIUM-VITAMIN D PO Take 1 tablet by mouth 2 (two) times daily.    [name] ELIQUIS 5 MG TABS tablet Take 5 mg by mouth 2 (two) times daily.    . feeding supplement, ENSURE ENLIVE, (ENSURE ENLIVE) LIQD Take 237 mLs by mouth 2 (two) times daily between meals.   . fexofenadine (ALLEGRA) 180 MG tablet Take 180 mg by mouth daily.   . isosorbide mononitrate (IMDUR) 30 MG 24 hr tablet Take 30 mg by mouth daily.   . metoprolol succinate (TOPROL-XL) 25 MG 24 hr tablet Take 25 mg by mouth daily.   . nitroGLYCERIN (NITROSTAT) 0.4 MG SL tablet Place 0.4 mg under the tongue every 5 (five) minutes as needed for chest pain.   . Omega-3 Fatty Acids (FISH OIL) 1000 MG CAPS Take 1,000 mg by mouth at bedtime.    [name] omeprazole (PRILOSEC) 40 MG capsule Take 1 capsule (40 mg  total) by mouth 2 (two) times daily. (Patient taking differently: Take 20 mg by mouth 2 (two) times daily. ) 09/04/2016: Takes 20 mg  . OXYGEN Inhale 2.5 L into the lungs continuous.   . potassium chloride SA (K-DUR,KLOR-CON) 10 MEQ tablet Take 1 tablet (10 mEq total) by mouth daily. 09/04/2016: Takes 33mq  . vitamin B-12 (CYANOCOBALAMIN) 1000 MCG tablet Take 1,000 mcg by mouth 2 (two) times daily.   . hydrocortisone cream 1 % Apply 1 application topically 2 (two) times daily as needed for itching. (Patient not taking: Reported on 09/04/2016)   . ondansetron (ZOFRAN ODT) 4 MG disintegrating tablet Take  1 tablet (4 mg total) by mouth every 8 (eight) hours as needed for nausea or vomiting. (Patient not taking: Reported on 09/04/2016)   . torsemide (DEMADEX) 20 MG tablet Take 1 tablet (20 mg total) by mouth 2 (two) times daily.    No facility-administered encounter medications on file as of 09/04/2016.     Functional Status:   In your present state of health, do you have any difficulty performing the following activities: 09/04/2016 08/03/2016  Hearing? N Y  Vision? N N  Difficulty concentrating or making decisions? N N  Walking or climbing stairs? N Y  Dressing or bathing? N N  Doing errands, shopping? YTempie Donning Preparing Food and eating ? N -  Using the Toilet? N -  In the past six months, have you accidently leaked urine? Y -  Do you have problems with loss of bowel control? N -  Managing your Medications? N -  Managing your Finances? N -  Housekeeping or managing your Housekeeping? N -  Some recent data might be hidden    Fall/Depression Screening:    Fall Risk  09/04/2016 04/10/2016  Falls in the past year? No Yes  Number falls in past yr: - 2 or more  Injury with Fall? - Yes  Risk Factor Category  - High Fall Risk  Risk for fall due to : - History of fall(s)  Follow up - Falls prevention discussed   PHQ 2/9 Scores 09/04/2016 04/10/2016  PHQ - 2 Score 0 0    Assessment:   (1[facility] and my contact information from 5 months ago. Reviewed consent and patient declines any needed changes. (2) active with [facility]. (3) family is fixing medication planner. Patient is able to take his own medications. (4) weighing daily and recording. (5) not following low salt diet and is not interested in low salt diet. (6) Has DNR order posted at head of hospital bed. (7) Lung disease: wears oxygen as needed. Uses neb machine as needed. (8) 4 admissions in the last 6 months  Plan:  (1) reviewed consent in EMR. (2) collaboration with AM[place]urse for high risk initiatives.  (3) Reviewed importance of taking all medications as prescribed with patient. (4) reviewed importance of daily weights and calling MD for weight gain. Reviewed CHF zones and zones are posted on refrigerator.  (5) reviewed reasons to follow low salt diet.  Patient declines interest. (6) reviewed patients wishes to be a DNR.  (7) encouraged patient to continue to use his prn medications as needed. Reviewed summer heat and the risk of being more short of breath with higher temperature outside. Patient states that he stays inside.  (8) Reviewed early recognition of increased swelling and weight gain and the importance of calling MD immediately to avoid a readmission.  Care planning and goal setting during home visit. Primary goal is to avoid a readmission.   Next outreach via phone in 1 week. Note sent to MD.  [facility] CM Care Plan Problem One     Most Recent Value  Care Plan Problem One  Recent admission with CHF excerbation.   Role Documenting the Problem One  Care Management [place] for Problem One  Active  [facility] Long Term Goal   Patient will report no readmissions related to heart failure in the next 60 days.  [facility] Long Term Goal Start Date  08/16/16  Interventions for Problem One Long Term Goal  Home visit completed and reviewed CHF zones and when to call MD.  [facility] CM Short Term Goal #1   Patient will weigh daily for the next 30 days.   [facility] CM Short Term Goal #1 Start Date  08/16/16  Interventions for Short Term Goal #1  praised patient for job well done and encouraged patient to continue to weigh daily  [facility] CM Short Term Goal #2   Patient will attend follow up at the heart failure clinic in 2 days.   [facility] CM Short Term Goal #2 Start Date  08/16/16  [facility] CM Short Term Goal #2 Met Date  09/04/16 Barrie Folk met]  Interventions for Short Term Goal #2  Reviewed importance of follow up with MD office. Encouraged patient to attend appointments .       [name], RN, BSN, CEN [facility] [employer] [phone]

## 2016-09-06 DIAGNOSIS — I110 Hypertensive heart disease with heart failure: Secondary | ICD-10-CM | POA: Diagnosis not present

## 2016-09-06 DIAGNOSIS — J9620 Acute and chronic respiratory failure, unspecified whether with hypoxia or hypercapnia: Secondary | ICD-10-CM | POA: Diagnosis not present

## 2016-09-06 DIAGNOSIS — J449 Chronic obstructive pulmonary disease, unspecified: Secondary | ICD-10-CM | POA: Diagnosis not present

## 2016-09-06 DIAGNOSIS — E1122 Type 2 diabetes mellitus with diabetic chronic kidney disease: Secondary | ICD-10-CM | POA: Diagnosis not present

## 2016-09-06 DIAGNOSIS — I5043 Acute on chronic combined systolic (congestive) and diastolic (congestive) heart failure: Secondary | ICD-10-CM | POA: Diagnosis not present

## 2016-09-06 DIAGNOSIS — Z9981 Dependence on supplemental oxygen: Secondary | ICD-10-CM | POA: Diagnosis not present

## 2016-09-06 DIAGNOSIS — J628 Pneumoconiosis due to other dust containing silica: Secondary | ICD-10-CM | POA: Diagnosis not present

## 2016-09-06 DIAGNOSIS — Z950 Presence of cardiac pacemaker: Secondary | ICD-10-CM | POA: Diagnosis not present

## 2016-09-06 DIAGNOSIS — E871 Hypo-osmolality and hyponatremia: Secondary | ICD-10-CM | POA: Diagnosis not present

## 2016-09-06 DIAGNOSIS — D638 Anemia in other chronic diseases classified elsewhere: Secondary | ICD-10-CM | POA: Diagnosis not present

## 2016-09-06 DIAGNOSIS — N183 Chronic kidney disease, stage 3 (moderate): Secondary | ICD-10-CM | POA: Diagnosis not present

## 2016-09-06 DIAGNOSIS — I482 Chronic atrial fibrillation: Secondary | ICD-10-CM | POA: Diagnosis not present

## 2016-09-06 DIAGNOSIS — I255 Ischemic cardiomyopathy: Secondary | ICD-10-CM | POA: Diagnosis not present

## 2016-09-10 ENCOUNTER — Other Ambulatory Visit: Payer: Self-pay

## 2016-09-10 ENCOUNTER — Ambulatory Visit: Payer: Self-pay

## 2016-09-10 NOTE — Patient Outreach (Signed)
Transition of care: Vitals:   09/10/16 1008  Weight: 124 lb (56.2 kg)   Placed call to patient who answered and reports that he is doing well. Reports weight stable at 124 pounds. Denies any new swelling or shortness of breath. Reports that he is doing well.  PLAN: Will continue weekly transition of care calls.  [name], RN, BSN, CEN [facility] [employer] [phone]

## 2016-09-11 DIAGNOSIS — Z950 Presence of cardiac pacemaker: Secondary | ICD-10-CM | POA: Diagnosis not present

## 2016-09-11 DIAGNOSIS — I5043 Acute on chronic combined systolic (congestive) and diastolic (congestive) heart failure: Secondary | ICD-10-CM | POA: Diagnosis not present

## 2016-09-11 DIAGNOSIS — J9620 Acute and chronic respiratory failure, unspecified whether with hypoxia or hypercapnia: Secondary | ICD-10-CM | POA: Diagnosis not present

## 2016-09-11 DIAGNOSIS — Z9981 Dependence on supplemental oxygen: Secondary | ICD-10-CM | POA: Diagnosis not present

## 2016-09-11 DIAGNOSIS — I255 Ischemic cardiomyopathy: Secondary | ICD-10-CM | POA: Diagnosis not present

## 2016-09-11 DIAGNOSIS — J628 Pneumoconiosis due to other dust containing silica: Secondary | ICD-10-CM | POA: Diagnosis not present

## 2016-09-11 DIAGNOSIS — N183 Chronic kidney disease, stage 3 (moderate): Secondary | ICD-10-CM | POA: Diagnosis not present

## 2016-09-11 DIAGNOSIS — J449 Chronic obstructive pulmonary disease, unspecified: Secondary | ICD-10-CM | POA: Diagnosis not present

## 2016-09-11 DIAGNOSIS — E1122 Type 2 diabetes mellitus with diabetic chronic kidney disease: Secondary | ICD-10-CM | POA: Diagnosis not present

## 2016-09-11 DIAGNOSIS — I110 Hypertensive heart disease with heart failure: Secondary | ICD-10-CM | POA: Diagnosis not present

## 2016-09-11 DIAGNOSIS — E871 Hypo-osmolality and hyponatremia: Secondary | ICD-10-CM | POA: Diagnosis not present

## 2016-09-11 DIAGNOSIS — D638 Anemia in other chronic diseases classified elsewhere: Secondary | ICD-10-CM | POA: Diagnosis not present

## 2016-09-11 DIAGNOSIS — I482 Chronic atrial fibrillation: Secondary | ICD-10-CM | POA: Diagnosis not present

## 2016-09-13 DIAGNOSIS — I110 Hypertensive heart disease with heart failure: Secondary | ICD-10-CM | POA: Diagnosis not present

## 2016-09-13 DIAGNOSIS — J449 Chronic obstructive pulmonary disease, unspecified: Secondary | ICD-10-CM | POA: Diagnosis not present

## 2016-09-13 DIAGNOSIS — Z9981 Dependence on supplemental oxygen: Secondary | ICD-10-CM | POA: Diagnosis not present

## 2016-09-13 DIAGNOSIS — N183 Chronic kidney disease, stage 3 (moderate): Secondary | ICD-10-CM | POA: Diagnosis not present

## 2016-09-13 DIAGNOSIS — E871 Hypo-osmolality and hyponatremia: Secondary | ICD-10-CM | POA: Diagnosis not present

## 2016-09-13 DIAGNOSIS — I482 Chronic atrial fibrillation: Secondary | ICD-10-CM | POA: Diagnosis not present

## 2016-09-13 DIAGNOSIS — Z950 Presence of cardiac pacemaker: Secondary | ICD-10-CM | POA: Diagnosis not present

## 2016-09-13 DIAGNOSIS — D638 Anemia in other chronic diseases classified elsewhere: Secondary | ICD-10-CM | POA: Diagnosis not present

## 2016-09-13 DIAGNOSIS — I5043 Acute on chronic combined systolic (congestive) and diastolic (congestive) heart failure: Secondary | ICD-10-CM | POA: Diagnosis not present

## 2016-09-13 DIAGNOSIS — I255 Ischemic cardiomyopathy: Secondary | ICD-10-CM | POA: Diagnosis not present

## 2016-09-13 DIAGNOSIS — J9620 Acute and chronic respiratory failure, unspecified whether with hypoxia or hypercapnia: Secondary | ICD-10-CM | POA: Diagnosis not present

## 2016-09-13 DIAGNOSIS — E1122 Type 2 diabetes mellitus with diabetic chronic kidney disease: Secondary | ICD-10-CM | POA: Diagnosis not present

## 2016-09-13 DIAGNOSIS — J628 Pneumoconiosis due to other dust containing silica: Secondary | ICD-10-CM | POA: Diagnosis not present

## 2016-09-17 ENCOUNTER — Other Ambulatory Visit: Payer: Self-pay

## 2016-09-17 NOTE — Patient Outreach (Signed)
Transition of care: Placed call to patient who reports that he is doing well. States no swelling and no shortness of breath. States that he continues to weigh daily with no weight gain. Reports that he remains active with [facility].  Reports that he has an MD appointment on 10/08/2016.  Denies any new problems.   Plan: Patient has completed 30 day transition of care without a readmission.  Will plan to contact patient again in 30 days.  [name], RN, BSN, CEN [facility] [employer] [phone]

## 2016-09-18 DIAGNOSIS — Z950 Presence of cardiac pacemaker: Secondary | ICD-10-CM | POA: Diagnosis not present

## 2016-09-18 DIAGNOSIS — Z9981 Dependence on supplemental oxygen: Secondary | ICD-10-CM | POA: Diagnosis not present

## 2016-09-18 DIAGNOSIS — E871 Hypo-osmolality and hyponatremia: Secondary | ICD-10-CM | POA: Diagnosis not present

## 2016-09-18 DIAGNOSIS — I255 Ischemic cardiomyopathy: Secondary | ICD-10-CM | POA: Diagnosis not present

## 2016-09-18 DIAGNOSIS — I5043 Acute on chronic combined systolic (congestive) and diastolic (congestive) heart failure: Secondary | ICD-10-CM | POA: Diagnosis not present

## 2016-09-18 DIAGNOSIS — J9620 Acute and chronic respiratory failure, unspecified whether with hypoxia or hypercapnia: Secondary | ICD-10-CM | POA: Diagnosis not present

## 2016-09-18 DIAGNOSIS — J449 Chronic obstructive pulmonary disease, unspecified: Secondary | ICD-10-CM | POA: Diagnosis not present

## 2016-09-18 DIAGNOSIS — I482 Chronic atrial fibrillation: Secondary | ICD-10-CM | POA: Diagnosis not present

## 2016-09-18 DIAGNOSIS — I110 Hypertensive heart disease with heart failure: Secondary | ICD-10-CM | POA: Diagnosis not present

## 2016-09-18 DIAGNOSIS — J628 Pneumoconiosis due to other dust containing silica: Secondary | ICD-10-CM | POA: Diagnosis not present

## 2016-09-18 DIAGNOSIS — N183 Chronic kidney disease, stage 3 (moderate): Secondary | ICD-10-CM | POA: Diagnosis not present

## 2016-09-18 DIAGNOSIS — D638 Anemia in other chronic diseases classified elsewhere: Secondary | ICD-10-CM | POA: Diagnosis not present

## 2016-09-18 DIAGNOSIS — E1122 Type 2 diabetes mellitus with diabetic chronic kidney disease: Secondary | ICD-10-CM | POA: Diagnosis not present

## 2016-09-19 DIAGNOSIS — I255 Ischemic cardiomyopathy: Secondary | ICD-10-CM | POA: Diagnosis not present

## 2016-09-19 DIAGNOSIS — J628 Pneumoconiosis due to other dust containing silica: Secondary | ICD-10-CM | POA: Diagnosis not present

## 2016-09-19 DIAGNOSIS — Z9981 Dependence on supplemental oxygen: Secondary | ICD-10-CM | POA: Diagnosis not present

## 2016-09-19 DIAGNOSIS — J449 Chronic obstructive pulmonary disease, unspecified: Secondary | ICD-10-CM | POA: Diagnosis not present

## 2016-09-19 DIAGNOSIS — I5043 Acute on chronic combined systolic (congestive) and diastolic (congestive) heart failure: Secondary | ICD-10-CM | POA: Diagnosis not present

## 2016-09-19 DIAGNOSIS — J9620 Acute and chronic respiratory failure, unspecified whether with hypoxia or hypercapnia: Secondary | ICD-10-CM | POA: Diagnosis not present

## 2016-09-19 DIAGNOSIS — I110 Hypertensive heart disease with heart failure: Secondary | ICD-10-CM | POA: Diagnosis not present

## 2016-09-19 DIAGNOSIS — E871 Hypo-osmolality and hyponatremia: Secondary | ICD-10-CM | POA: Diagnosis not present

## 2016-09-19 DIAGNOSIS — I482 Chronic atrial fibrillation: Secondary | ICD-10-CM | POA: Diagnosis not present

## 2016-09-19 DIAGNOSIS — E1122 Type 2 diabetes mellitus with diabetic chronic kidney disease: Secondary | ICD-10-CM | POA: Diagnosis not present

## 2016-09-19 DIAGNOSIS — Z950 Presence of cardiac pacemaker: Secondary | ICD-10-CM | POA: Diagnosis not present

## 2016-09-19 DIAGNOSIS — N183 Chronic kidney disease, stage 3 (moderate): Secondary | ICD-10-CM | POA: Diagnosis not present

## 2016-09-19 DIAGNOSIS — D638 Anemia in other chronic diseases classified elsewhere: Secondary | ICD-10-CM | POA: Diagnosis not present

## 2016-09-20 DIAGNOSIS — J628 Pneumoconiosis due to other dust containing silica: Secondary | ICD-10-CM | POA: Diagnosis not present

## 2016-09-20 DIAGNOSIS — Z9981 Dependence on supplemental oxygen: Secondary | ICD-10-CM | POA: Diagnosis not present

## 2016-09-20 DIAGNOSIS — D638 Anemia in other chronic diseases classified elsewhere: Secondary | ICD-10-CM | POA: Diagnosis not present

## 2016-09-20 DIAGNOSIS — I255 Ischemic cardiomyopathy: Secondary | ICD-10-CM | POA: Diagnosis not present

## 2016-09-20 DIAGNOSIS — N183 Chronic kidney disease, stage 3 (moderate): Secondary | ICD-10-CM | POA: Diagnosis not present

## 2016-09-20 DIAGNOSIS — I5043 Acute on chronic combined systolic (congestive) and diastolic (congestive) heart failure: Secondary | ICD-10-CM | POA: Diagnosis not present

## 2016-09-20 DIAGNOSIS — E871 Hypo-osmolality and hyponatremia: Secondary | ICD-10-CM | POA: Diagnosis not present

## 2016-09-20 DIAGNOSIS — E1122 Type 2 diabetes mellitus with diabetic chronic kidney disease: Secondary | ICD-10-CM | POA: Diagnosis not present

## 2016-09-20 DIAGNOSIS — J9620 Acute and chronic respiratory failure, unspecified whether with hypoxia or hypercapnia: Secondary | ICD-10-CM | POA: Diagnosis not present

## 2016-09-20 DIAGNOSIS — Z950 Presence of cardiac pacemaker: Secondary | ICD-10-CM | POA: Diagnosis not present

## 2016-09-20 DIAGNOSIS — I482 Chronic atrial fibrillation: Secondary | ICD-10-CM | POA: Diagnosis not present

## 2016-09-20 DIAGNOSIS — J449 Chronic obstructive pulmonary disease, unspecified: Secondary | ICD-10-CM | POA: Diagnosis not present

## 2016-09-20 DIAGNOSIS — I110 Hypertensive heart disease with heart failure: Secondary | ICD-10-CM | POA: Diagnosis not present

## 2016-09-21 DIAGNOSIS — D638 Anemia in other chronic diseases classified elsewhere: Secondary | ICD-10-CM | POA: Diagnosis not present

## 2016-09-21 DIAGNOSIS — I5043 Acute on chronic combined systolic (congestive) and diastolic (congestive) heart failure: Secondary | ICD-10-CM | POA: Diagnosis not present

## 2016-09-21 DIAGNOSIS — I255 Ischemic cardiomyopathy: Secondary | ICD-10-CM | POA: Diagnosis not present

## 2016-09-21 DIAGNOSIS — I482 Chronic atrial fibrillation: Secondary | ICD-10-CM | POA: Diagnosis not present

## 2016-09-21 DIAGNOSIS — J628 Pneumoconiosis due to other dust containing silica: Secondary | ICD-10-CM | POA: Diagnosis not present

## 2016-09-21 DIAGNOSIS — Z950 Presence of cardiac pacemaker: Secondary | ICD-10-CM | POA: Diagnosis not present

## 2016-09-21 DIAGNOSIS — I110 Hypertensive heart disease with heart failure: Secondary | ICD-10-CM | POA: Diagnosis not present

## 2016-09-21 DIAGNOSIS — N183 Chronic kidney disease, stage 3 (moderate): Secondary | ICD-10-CM | POA: Diagnosis not present

## 2016-09-21 DIAGNOSIS — J9620 Acute and chronic respiratory failure, unspecified whether with hypoxia or hypercapnia: Secondary | ICD-10-CM | POA: Diagnosis not present

## 2016-09-21 DIAGNOSIS — E1122 Type 2 diabetes mellitus with diabetic chronic kidney disease: Secondary | ICD-10-CM | POA: Diagnosis not present

## 2016-09-21 DIAGNOSIS — E871 Hypo-osmolality and hyponatremia: Secondary | ICD-10-CM | POA: Diagnosis not present

## 2016-09-21 DIAGNOSIS — Z9981 Dependence on supplemental oxygen: Secondary | ICD-10-CM | POA: Diagnosis not present

## 2016-09-21 DIAGNOSIS — J449 Chronic obstructive pulmonary disease, unspecified: Secondary | ICD-10-CM | POA: Diagnosis not present

## 2016-09-25 DIAGNOSIS — I130 Hypertensive heart and chronic kidney disease with heart failure and stage 1 through stage 4 chronic kidney disease, or unspecified chronic kidney disease: Secondary | ICD-10-CM | POA: Diagnosis not present

## 2016-09-25 DIAGNOSIS — D638 Anemia in other chronic diseases classified elsewhere: Secondary | ICD-10-CM | POA: Diagnosis not present

## 2016-09-25 DIAGNOSIS — I255 Ischemic cardiomyopathy: Secondary | ICD-10-CM | POA: Diagnosis not present

## 2016-09-25 DIAGNOSIS — I482 Chronic atrial fibrillation: Secondary | ICD-10-CM | POA: Diagnosis not present

## 2016-09-25 DIAGNOSIS — J449 Chronic obstructive pulmonary disease, unspecified: Secondary | ICD-10-CM | POA: Diagnosis not present

## 2016-09-25 DIAGNOSIS — I110 Hypertensive heart disease with heart failure: Secondary | ICD-10-CM | POA: Diagnosis not present

## 2016-09-25 DIAGNOSIS — I5043 Acute on chronic combined systolic (congestive) and diastolic (congestive) heart failure: Secondary | ICD-10-CM | POA: Diagnosis not present

## 2016-09-25 DIAGNOSIS — N183 Chronic kidney disease, stage 3 (moderate): Secondary | ICD-10-CM | POA: Diagnosis not present

## 2016-09-25 DIAGNOSIS — Z9981 Dependence on supplemental oxygen: Secondary | ICD-10-CM | POA: Diagnosis not present

## 2016-09-25 DIAGNOSIS — Z950 Presence of cardiac pacemaker: Secondary | ICD-10-CM | POA: Diagnosis not present

## 2016-09-25 DIAGNOSIS — E871 Hypo-osmolality and hyponatremia: Secondary | ICD-10-CM | POA: Diagnosis not present

## 2016-09-25 DIAGNOSIS — J628 Pneumoconiosis due to other dust containing silica: Secondary | ICD-10-CM | POA: Diagnosis not present

## 2016-09-25 DIAGNOSIS — E1122 Type 2 diabetes mellitus with diabetic chronic kidney disease: Secondary | ICD-10-CM | POA: Diagnosis not present

## 2016-09-25 DIAGNOSIS — J9620 Acute and chronic respiratory failure, unspecified whether with hypoxia or hypercapnia: Secondary | ICD-10-CM | POA: Diagnosis not present

## 2016-09-27 DIAGNOSIS — E871 Hypo-osmolality and hyponatremia: Secondary | ICD-10-CM | POA: Diagnosis not present

## 2016-09-27 DIAGNOSIS — I482 Chronic atrial fibrillation: Secondary | ICD-10-CM | POA: Diagnosis not present

## 2016-09-27 DIAGNOSIS — J9620 Acute and chronic respiratory failure, unspecified whether with hypoxia or hypercapnia: Secondary | ICD-10-CM | POA: Diagnosis not present

## 2016-09-27 DIAGNOSIS — J628 Pneumoconiosis due to other dust containing silica: Secondary | ICD-10-CM | POA: Diagnosis not present

## 2016-09-27 DIAGNOSIS — Z9981 Dependence on supplemental oxygen: Secondary | ICD-10-CM | POA: Diagnosis not present

## 2016-09-27 DIAGNOSIS — J449 Chronic obstructive pulmonary disease, unspecified: Secondary | ICD-10-CM | POA: Diagnosis not present

## 2016-09-27 DIAGNOSIS — N183 Chronic kidney disease, stage 3 (moderate): Secondary | ICD-10-CM | POA: Diagnosis not present

## 2016-09-27 DIAGNOSIS — I110 Hypertensive heart disease with heart failure: Secondary | ICD-10-CM | POA: Diagnosis not present

## 2016-09-27 DIAGNOSIS — I255 Ischemic cardiomyopathy: Secondary | ICD-10-CM | POA: Diagnosis not present

## 2016-09-27 DIAGNOSIS — Z950 Presence of cardiac pacemaker: Secondary | ICD-10-CM | POA: Diagnosis not present

## 2016-09-27 DIAGNOSIS — E1122 Type 2 diabetes mellitus with diabetic chronic kidney disease: Secondary | ICD-10-CM | POA: Diagnosis not present

## 2016-09-27 DIAGNOSIS — D638 Anemia in other chronic diseases classified elsewhere: Secondary | ICD-10-CM | POA: Diagnosis not present

## 2016-09-27 DIAGNOSIS — I5043 Acute on chronic combined systolic (congestive) and diastolic (congestive) heart failure: Secondary | ICD-10-CM | POA: Diagnosis not present

## 2016-09-28 DIAGNOSIS — I110 Hypertensive heart disease with heart failure: Secondary | ICD-10-CM | POA: Diagnosis not present

## 2016-09-28 DIAGNOSIS — Z9981 Dependence on supplemental oxygen: Secondary | ICD-10-CM | POA: Diagnosis not present

## 2016-10-01 DIAGNOSIS — I5022 Chronic systolic (congestive) heart failure: Secondary | ICD-10-CM | POA: Diagnosis not present

## 2016-10-02 DIAGNOSIS — E871 Hypo-osmolality and hyponatremia: Secondary | ICD-10-CM | POA: Diagnosis not present

## 2016-10-02 DIAGNOSIS — E1122 Type 2 diabetes mellitus with diabetic chronic kidney disease: Secondary | ICD-10-CM | POA: Diagnosis not present

## 2016-10-02 DIAGNOSIS — I110 Hypertensive heart disease with heart failure: Secondary | ICD-10-CM | POA: Diagnosis not present

## 2016-10-02 DIAGNOSIS — J449 Chronic obstructive pulmonary disease, unspecified: Secondary | ICD-10-CM | POA: Diagnosis not present

## 2016-10-02 DIAGNOSIS — N183 Chronic kidney disease, stage 3 (moderate): Secondary | ICD-10-CM | POA: Diagnosis not present

## 2016-10-02 DIAGNOSIS — I255 Ischemic cardiomyopathy: Secondary | ICD-10-CM | POA: Diagnosis not present

## 2016-10-02 DIAGNOSIS — J628 Pneumoconiosis due to other dust containing silica: Secondary | ICD-10-CM | POA: Diagnosis not present

## 2016-10-02 DIAGNOSIS — D638 Anemia in other chronic diseases classified elsewhere: Secondary | ICD-10-CM | POA: Diagnosis not present

## 2016-10-02 DIAGNOSIS — Z9981 Dependence on supplemental oxygen: Secondary | ICD-10-CM | POA: Diagnosis not present

## 2016-10-02 DIAGNOSIS — I5043 Acute on chronic combined systolic (congestive) and diastolic (congestive) heart failure: Secondary | ICD-10-CM | POA: Diagnosis not present

## 2016-10-02 DIAGNOSIS — I482 Chronic atrial fibrillation: Secondary | ICD-10-CM | POA: Diagnosis not present

## 2016-10-02 DIAGNOSIS — Z950 Presence of cardiac pacemaker: Secondary | ICD-10-CM | POA: Diagnosis not present

## 2016-10-02 DIAGNOSIS — J9620 Acute and chronic respiratory failure, unspecified whether with hypoxia or hypercapnia: Secondary | ICD-10-CM | POA: Diagnosis not present

## 2016-10-04 DIAGNOSIS — E1122 Type 2 diabetes mellitus with diabetic chronic kidney disease: Secondary | ICD-10-CM | POA: Diagnosis not present

## 2016-10-04 DIAGNOSIS — J9620 Acute and chronic respiratory failure, unspecified whether with hypoxia or hypercapnia: Secondary | ICD-10-CM | POA: Diagnosis not present

## 2016-10-04 DIAGNOSIS — I5043 Acute on chronic combined systolic (congestive) and diastolic (congestive) heart failure: Secondary | ICD-10-CM | POA: Diagnosis not present

## 2016-10-04 DIAGNOSIS — J628 Pneumoconiosis due to other dust containing silica: Secondary | ICD-10-CM | POA: Diagnosis not present

## 2016-10-04 DIAGNOSIS — Z9981 Dependence on supplemental oxygen: Secondary | ICD-10-CM | POA: Diagnosis not present

## 2016-10-04 DIAGNOSIS — D638 Anemia in other chronic diseases classified elsewhere: Secondary | ICD-10-CM | POA: Diagnosis not present

## 2016-10-04 DIAGNOSIS — I482 Chronic atrial fibrillation: Secondary | ICD-10-CM | POA: Diagnosis not present

## 2016-10-04 DIAGNOSIS — J449 Chronic obstructive pulmonary disease, unspecified: Secondary | ICD-10-CM | POA: Diagnosis not present

## 2016-10-04 DIAGNOSIS — Z950 Presence of cardiac pacemaker: Secondary | ICD-10-CM | POA: Diagnosis not present

## 2016-10-04 DIAGNOSIS — E871 Hypo-osmolality and hyponatremia: Secondary | ICD-10-CM | POA: Diagnosis not present

## 2016-10-04 DIAGNOSIS — I255 Ischemic cardiomyopathy: Secondary | ICD-10-CM | POA: Diagnosis not present

## 2016-10-04 DIAGNOSIS — I110 Hypertensive heart disease with heart failure: Secondary | ICD-10-CM | POA: Diagnosis not present

## 2016-10-04 DIAGNOSIS — N183 Chronic kidney disease, stage 3 (moderate): Secondary | ICD-10-CM | POA: Diagnosis not present

## 2016-10-05 DIAGNOSIS — E1122 Type 2 diabetes mellitus with diabetic chronic kidney disease: Secondary | ICD-10-CM | POA: Diagnosis not present

## 2016-10-05 DIAGNOSIS — J628 Pneumoconiosis due to other dust containing silica: Secondary | ICD-10-CM | POA: Diagnosis not present

## 2016-10-05 DIAGNOSIS — I110 Hypertensive heart disease with heart failure: Secondary | ICD-10-CM | POA: Diagnosis not present

## 2016-10-05 DIAGNOSIS — N183 Chronic kidney disease, stage 3 (moderate): Secondary | ICD-10-CM | POA: Diagnosis not present

## 2016-10-05 DIAGNOSIS — I5043 Acute on chronic combined systolic (congestive) and diastolic (congestive) heart failure: Secondary | ICD-10-CM | POA: Diagnosis not present

## 2016-10-05 DIAGNOSIS — E871 Hypo-osmolality and hyponatremia: Secondary | ICD-10-CM | POA: Diagnosis not present

## 2016-10-05 DIAGNOSIS — D638 Anemia in other chronic diseases classified elsewhere: Secondary | ICD-10-CM | POA: Diagnosis not present

## 2016-10-05 DIAGNOSIS — J449 Chronic obstructive pulmonary disease, unspecified: Secondary | ICD-10-CM | POA: Diagnosis not present

## 2016-10-05 DIAGNOSIS — Z950 Presence of cardiac pacemaker: Secondary | ICD-10-CM | POA: Diagnosis not present

## 2016-10-05 DIAGNOSIS — J9620 Acute and chronic respiratory failure, unspecified whether with hypoxia or hypercapnia: Secondary | ICD-10-CM | POA: Diagnosis not present

## 2016-10-05 DIAGNOSIS — I255 Ischemic cardiomyopathy: Secondary | ICD-10-CM | POA: Diagnosis not present

## 2016-10-05 DIAGNOSIS — Z9981 Dependence on supplemental oxygen: Secondary | ICD-10-CM | POA: Diagnosis not present

## 2016-10-05 DIAGNOSIS — I482 Chronic atrial fibrillation: Secondary | ICD-10-CM | POA: Diagnosis not present

## 2016-10-06 DIAGNOSIS — D638 Anemia in other chronic diseases classified elsewhere: Secondary | ICD-10-CM | POA: Diagnosis not present

## 2016-10-06 DIAGNOSIS — I482 Chronic atrial fibrillation: Secondary | ICD-10-CM | POA: Diagnosis not present

## 2016-10-06 DIAGNOSIS — Z950 Presence of cardiac pacemaker: Secondary | ICD-10-CM | POA: Diagnosis not present

## 2016-10-06 DIAGNOSIS — I255 Ischemic cardiomyopathy: Secondary | ICD-10-CM | POA: Diagnosis not present

## 2016-10-06 DIAGNOSIS — J449 Chronic obstructive pulmonary disease, unspecified: Secondary | ICD-10-CM | POA: Diagnosis not present

## 2016-10-06 DIAGNOSIS — J628 Pneumoconiosis due to other dust containing silica: Secondary | ICD-10-CM | POA: Diagnosis not present

## 2016-10-06 DIAGNOSIS — J9620 Acute and chronic respiratory failure, unspecified whether with hypoxia or hypercapnia: Secondary | ICD-10-CM | POA: Diagnosis not present

## 2016-10-06 DIAGNOSIS — I110 Hypertensive heart disease with heart failure: Secondary | ICD-10-CM | POA: Diagnosis not present

## 2016-10-06 DIAGNOSIS — E1122 Type 2 diabetes mellitus with diabetic chronic kidney disease: Secondary | ICD-10-CM | POA: Diagnosis not present

## 2016-10-06 DIAGNOSIS — I5043 Acute on chronic combined systolic (congestive) and diastolic (congestive) heart failure: Secondary | ICD-10-CM | POA: Diagnosis not present

## 2016-10-06 DIAGNOSIS — Z9981 Dependence on supplemental oxygen: Secondary | ICD-10-CM | POA: Diagnosis not present

## 2016-10-06 DIAGNOSIS — E871 Hypo-osmolality and hyponatremia: Secondary | ICD-10-CM | POA: Diagnosis not present

## 2016-10-06 DIAGNOSIS — N183 Chronic kidney disease, stage 3 (moderate): Secondary | ICD-10-CM | POA: Diagnosis not present

## 2016-10-08 DIAGNOSIS — I5022 Chronic systolic (congestive) heart failure: Secondary | ICD-10-CM | POA: Diagnosis not present

## 2016-10-08 DIAGNOSIS — I480 Paroxysmal atrial fibrillation: Secondary | ICD-10-CM | POA: Diagnosis not present

## 2016-10-09 DIAGNOSIS — D638 Anemia in other chronic diseases classified elsewhere: Secondary | ICD-10-CM | POA: Diagnosis not present

## 2016-10-09 DIAGNOSIS — E871 Hypo-osmolality and hyponatremia: Secondary | ICD-10-CM | POA: Diagnosis not present

## 2016-10-09 DIAGNOSIS — Z9981 Dependence on supplemental oxygen: Secondary | ICD-10-CM | POA: Diagnosis not present

## 2016-10-09 DIAGNOSIS — I482 Chronic atrial fibrillation: Secondary | ICD-10-CM | POA: Diagnosis not present

## 2016-10-09 DIAGNOSIS — J449 Chronic obstructive pulmonary disease, unspecified: Secondary | ICD-10-CM | POA: Diagnosis not present

## 2016-10-09 DIAGNOSIS — E1122 Type 2 diabetes mellitus with diabetic chronic kidney disease: Secondary | ICD-10-CM | POA: Diagnosis not present

## 2016-10-09 DIAGNOSIS — J628 Pneumoconiosis due to other dust containing silica: Secondary | ICD-10-CM | POA: Diagnosis not present

## 2016-10-09 DIAGNOSIS — N183 Chronic kidney disease, stage 3 (moderate): Secondary | ICD-10-CM | POA: Diagnosis not present

## 2016-10-09 DIAGNOSIS — I5043 Acute on chronic combined systolic (congestive) and diastolic (congestive) heart failure: Secondary | ICD-10-CM | POA: Diagnosis not present

## 2016-10-09 DIAGNOSIS — I255 Ischemic cardiomyopathy: Secondary | ICD-10-CM | POA: Diagnosis not present

## 2016-10-09 DIAGNOSIS — J9620 Acute and chronic respiratory failure, unspecified whether with hypoxia or hypercapnia: Secondary | ICD-10-CM | POA: Diagnosis not present

## 2016-10-09 DIAGNOSIS — Z950 Presence of cardiac pacemaker: Secondary | ICD-10-CM | POA: Diagnosis not present

## 2016-10-09 DIAGNOSIS — I110 Hypertensive heart disease with heart failure: Secondary | ICD-10-CM | POA: Diagnosis not present

## 2016-10-12 DIAGNOSIS — J449 Chronic obstructive pulmonary disease, unspecified: Secondary | ICD-10-CM | POA: Diagnosis not present

## 2016-10-12 DIAGNOSIS — Z950 Presence of cardiac pacemaker: Secondary | ICD-10-CM | POA: Diagnosis not present

## 2016-10-12 DIAGNOSIS — N183 Chronic kidney disease, stage 3 (moderate): Secondary | ICD-10-CM | POA: Diagnosis not present

## 2016-10-12 DIAGNOSIS — I482 Chronic atrial fibrillation: Secondary | ICD-10-CM | POA: Diagnosis not present

## 2016-10-12 DIAGNOSIS — E871 Hypo-osmolality and hyponatremia: Secondary | ICD-10-CM | POA: Diagnosis not present

## 2016-10-12 DIAGNOSIS — J9620 Acute and chronic respiratory failure, unspecified whether with hypoxia or hypercapnia: Secondary | ICD-10-CM | POA: Diagnosis not present

## 2016-10-12 DIAGNOSIS — Z9981 Dependence on supplemental oxygen: Secondary | ICD-10-CM | POA: Diagnosis not present

## 2016-10-12 DIAGNOSIS — I255 Ischemic cardiomyopathy: Secondary | ICD-10-CM | POA: Diagnosis not present

## 2016-10-12 DIAGNOSIS — D638 Anemia in other chronic diseases classified elsewhere: Secondary | ICD-10-CM | POA: Diagnosis not present

## 2016-10-12 DIAGNOSIS — E1122 Type 2 diabetes mellitus with diabetic chronic kidney disease: Secondary | ICD-10-CM | POA: Diagnosis not present

## 2016-10-12 DIAGNOSIS — I110 Hypertensive heart disease with heart failure: Secondary | ICD-10-CM | POA: Diagnosis not present

## 2016-10-12 DIAGNOSIS — I5043 Acute on chronic combined systolic (congestive) and diastolic (congestive) heart failure: Secondary | ICD-10-CM | POA: Diagnosis not present

## 2016-10-12 DIAGNOSIS — J628 Pneumoconiosis due to other dust containing silica: Secondary | ICD-10-CM | POA: Diagnosis not present

## 2016-10-15 ENCOUNTER — Ambulatory Visit (INDEPENDENT_AMBULATORY_CARE_PROVIDER_SITE_OTHER): Payer: Medicare Other | Admitting: *Deleted

## 2016-10-15 DIAGNOSIS — Z9981 Dependence on supplemental oxygen: Secondary | ICD-10-CM | POA: Diagnosis not present

## 2016-10-15 DIAGNOSIS — D638 Anemia in other chronic diseases classified elsewhere: Secondary | ICD-10-CM | POA: Diagnosis not present

## 2016-10-15 DIAGNOSIS — I482 Chronic atrial fibrillation: Secondary | ICD-10-CM | POA: Diagnosis not present

## 2016-10-15 DIAGNOSIS — I255 Ischemic cardiomyopathy: Secondary | ICD-10-CM | POA: Diagnosis not present

## 2016-10-15 DIAGNOSIS — I110 Hypertensive heart disease with heart failure: Secondary | ICD-10-CM | POA: Diagnosis not present

## 2016-10-15 DIAGNOSIS — N183 Chronic kidney disease, stage 3 (moderate): Secondary | ICD-10-CM | POA: Diagnosis not present

## 2016-10-15 DIAGNOSIS — I5043 Acute on chronic combined systolic (congestive) and diastolic (congestive) heart failure: Secondary | ICD-10-CM | POA: Diagnosis not present

## 2016-10-15 DIAGNOSIS — J9620 Acute and chronic respiratory failure, unspecified whether with hypoxia or hypercapnia: Secondary | ICD-10-CM | POA: Diagnosis not present

## 2016-10-15 DIAGNOSIS — J449 Chronic obstructive pulmonary disease, unspecified: Secondary | ICD-10-CM | POA: Diagnosis not present

## 2016-10-15 DIAGNOSIS — E1122 Type 2 diabetes mellitus with diabetic chronic kidney disease: Secondary | ICD-10-CM | POA: Diagnosis not present

## 2016-10-15 DIAGNOSIS — E871 Hypo-osmolality and hyponatremia: Secondary | ICD-10-CM | POA: Diagnosis not present

## 2016-10-15 DIAGNOSIS — Z950 Presence of cardiac pacemaker: Secondary | ICD-10-CM | POA: Diagnosis not present

## 2016-10-15 DIAGNOSIS — J628 Pneumoconiosis due to other dust containing silica: Secondary | ICD-10-CM | POA: Diagnosis not present

## 2016-10-16 NOTE — Progress Notes (Signed)
Remote pacemaker transmission.   

## 2016-10-17 ENCOUNTER — Encounter: Payer: Self-pay | Admitting: Cardiology

## 2016-10-18 ENCOUNTER — Other Ambulatory Visit: Payer: Self-pay

## 2016-10-18 NOTE — Patient Outreach (Signed)
Telephone assessment/case closure/ 60 day follow up:  Placed call to patient who answered and reports that he is doing good.  Reports that he does not know what he weight was today. Reports that his home health nurse takes care of his weights.  He denies any issues today and hung up the phone.  PLAN: will close case as no readmission for greater than 60 days. Will send MD letter and patient a case closure letter.  [name], RN, BSN, CEN [facility] [employer] [phone]

## 2016-10-28 NOTE — Progress Notes (Deleted)
Cardiology Office Note   Date:  10/28/2016   ID:  [name], DOB [birth year], MRN [id number]  PCP:  [name] Gravel, MD  Cardiologist:   [name] Breeding, MD    No chief complaint on file.     History of Present Illness: [name] is a 83 y.o. male who presents for follow up of systolic HF (EF 78%)  and CAD status post CABG, atrial fib and history of CRT.  He was last in the hospital in May for decompensated CHF.  At that time he "fired" hospice.  He was seen in follow up in our office but could not have med titration secondary to hypotension.   ***   He was in Jan for respiratory failure.  He had exacerbation of COPD and CHF.  Of note during his recent hospitalization he did have a palliative care consult and was made a DNR.   He has done relatively OK.  At home he gets around with a walker and holds onto things.  Today he is very somnolent. His blood pressure is low. He's not weighing himself every day. He's had some physical therapy. There's been only sporadic nurses coming to the house. He drinks about 40 ounces of water and also coffee at home. He's not had any acute shortness of breath, PND or orthopnea. He's not had any palpitations, presyncope or syncope. He's not had any chest pressure, neck or arm discomfort.  He wears oxygen but actually didn't bring it today.   Past Medical History:  Diagnosis Date  . Aortic stenosis   . Arthritis   . Black lung disease ([place])   . CAD (coronary artery disease)    a. 07/2002 CABG x 3: LIMA->LAD, VG->Diag, VG->OM;  b. 06/2006 Cath: LM 50-60ost/p, LAD patent mid stent, D1 sev dzs, D2 patent stent, LCX nl, OM2 sev sten prox, RCA large/nl, VG->Diag nl, VG->OM nl, LIMA->LAD atretic, EF 30%.  . Carotid artery occlusion   . Chronic atrial fibrillation ([place])   . Chronic respiratory failure ([place])   . Chronic systolic CHF (congestive heart failure) ([place])    a. 12/2012 Echo: EF 20-25%, mid-dist antsept AK, mod dil LA. b. Echo 01/2016: EF 10-15%,  also severe RV dysfunction, mild AS (may be underrepresented).  . COPD (chronic obstructive pulmonary disease) ([place])    a. On home O2.  [name] CVA (cerebral vascular accident) ([place])   . Diabetes mellitus   . GERD (gastroesophageal reflux disease)   . GI bleed 01/2016  . Gout   . Hypercholesterolemia   . Hypertension   . Ischemia of lower extremity    a. s/p thrombectomy 01/2016.  . Ischemic cardiomyopathy    a. 06/2006  BIV-ICD -> gen change 2015 to [place].  . Lung cancer ([place])   . Nephrolithiasis 09/2000  . On home oxygen therapy    "2.5L; 24/7" (07/25/2016)  . Prostate cancer ([place]) 11/01/10   gleason 7, 8, 9, gold seeds 02/08/11  . Symptomatic bradycardia     Past Surgical History:  Procedure Laterality Date  . BI-VENTRICULAR PACEMAKER INSERTION (CRT-P)  12-02-13   downgrade of previously implanted CRTD to STJ CRTP  . BIV PACEMAKER GENERATOR CHANGE OUT N/A 12/02/2013   Procedure: BIV PACEMAKER GENERATOR CHANGE OUT;  Surgeon: Evans Lance, MD;  Location[facility] C[facility] LAB;  Service: Cardiovascular;  Laterality: N/A;  . CATARACT EXTRACTION W/ INTRAOCULAR LENS  IMPLANT, BILATERAL Bilateral   . COLONOSCOPY    . CORONARY ANGIOPLASTY WITH STENT  PLACEMENT  07/1997; 08/1997;03/1998  . CORONARY ARTERY BYPASS GRAFT  07/2002   CABG X3  . ESOPHAGOGASTRODUODENOSCOPY  02/19/2011   Procedure: ESOPHAGOGASTRODUODENOSCOPY (EGD);  Surgeon: Jeryl Columbia, MD;  Location[facility] ENDOSCOPY;  Service: Endoscopy;  Laterality: N/A;  . ESOPHAGOGASTRODUODENOSCOPY    . FEMORAL-POPLITEAL BYPASS GRAFT Right 01/20/2016   Procedure: THROMBECTOMY OF POPLITEAL ARTERY AND ANTERIOR TIBIAL ARTERY RIGHT LEG; INTRAOPERATIVE ARTERIOGRAM;  Surgeon: Waynetta Sandy, MD;  Location: [facility];  Service: Vascular;  Laterality: Right;  . INCISION AND DRAINAGE OF WOUND  08/2002   right thigh; S/P EVH  . INSERT / REPLACE / [place]; 1992; 01/2000;  . INSERT / REPLACE / REMOVE PACEMAKER  09/2003; 06/2006   w/AICD  .  INSERT / REPLACE / REMOVE PACEMAKER  12/2004   pacmaker explant  . SHOULDER ARTHROSCOPY W/ ROTATOR CUFF REPAIR  05/2008   left     Current Outpatient Prescriptions  Medication Sig Dispense Refill  . albuterol (PROVENTIL HFA;VENTOLIN HFA) 108 (90 Base) MCG/ACT inhaler Inhale 2 puffs into the lungs every 4 (four) hours as needed for wheezing or shortness of breath. 1 Inhaler 2  . albuterol (PROVENTIL) (2.5 MG/3ML) 0.083% nebulizer solution Take 2.5 mg by nebulization every 6 (six) hours as needed for wheezing or shortness of breath.    . allopurinol (ZYLOPRIM) 300 MG tablet Take 300 mg by mouth daily.      [name] CALCIUM-VITAMIN D PO Take 1 tablet by mouth 2 (two) times daily.     [name] ELIQUIS 5 MG TABS tablet Take 5 mg by mouth 2 (two) times daily.     . feeding supplement, ENSURE ENLIVE, (ENSURE ENLIVE) LIQD Take 237 mLs by mouth 2 (two) times daily between meals. 237 mL 12  . fexofenadine (ALLEGRA) 180 MG tablet Take 180 mg by mouth daily.    . hydrocortisone cream 1 % Apply 1 application topically 2 (two) times daily as needed for itching. (Patient not taking: Reported on 09/04/2016)    . isosorbide mononitrate (IMDUR) 30 MG 24 hr tablet Take 30 mg by mouth daily.    . metoprolol succinate (TOPROL-XL) 25 MG 24 hr tablet Take 25 mg by mouth daily.    . nitroGLYCERIN (NITROSTAT) 0.4 MG SL tablet Place 0.4 mg under the tongue every 5 (five) minutes as needed for chest pain.    . Omega-3 Fatty Acids (FISH OIL) 1000 MG CAPS Take 1,000 mg by mouth at bedtime.     [name] omeprazole (PRILOSEC) 40 MG capsule Take 1 capsule (40 mg total) by mouth 2 (two) times daily. (Patient taking differently: Take 20 mg by mouth 2 (two) times daily. ) 60 capsule 1  . ondansetron (ZOFRAN ODT) 4 MG disintegrating tablet Take 1 tablet (4 mg total) by mouth every 8 (eight) hours as needed for nausea or vomiting. (Patient not taking: Reported on 09/04/2016) 12 tablet 0  . OXYGEN Inhale 2.5 L into the lungs continuous.    . potassium  chloride SA (K-DUR,KLOR-CON) 10 MEQ tablet Take 1 tablet (10 mEq total) by mouth daily. 30 tablet 0  . torsemide (DEMADEX) 20 MG tablet Take 1 tablet (20 mg total) by mouth 2 (two) times daily. 60 tablet 0  . vitamin B-12 (CYANOCOBALAMIN) 1000 MCG tablet Take 1,000 mcg by mouth 2 (two) times daily.     No current facility-administered medications for this visit.     Allergies:   [place] [digoxin]; Aspirin; and Penicillins    ROS:  Please see the history  of present illness.   Otherwise, review of systems are positive for ***.   All other systems are reviewed and negative.    PHYSICAL EXAM: VS:  There were no vitals taken for this visit. , BMI There is no height or weight on file to calculate BMI.  GENERAL:  Well appearing NECK:  No jugular venous distention, waveform within normal limits, carotid upstroke brisk and symmetric, no bruits, no thyromegaly LUNGS:  Clear to auscultation bilaterally CHEST:  Unremarkable HEART:  PMI not displaced or sustained,S1 and S2 within normal limits, no S3, no S4, no clicks, no rubs, *** murmurs ABD:  Flat, positive bowel sounds normal in frequency in pitch, no bruits, no rebound, no guarding, no midline pulsatile mass, no hepatomegaly, no splenomegaly EXT:  2 plus pulses throughout, no edema, no cyanosis no clubbing   GEN:  No distress, very frail NECK:  No jugular venous distention at 90 degrees, waveform within normal limits, carotid upstroke brisk and symmetric, no bruits, no thyromegaly LYMPHATICS:  No cervical adenopathy LUNGS:  Clear to auscultation bilaterally BACK:  No CVA tenderness CHEST:  Unremarkable HEART:  S1 and S2 within normal limits, no S3, no S4, no clicks, no rubs, no murmurs ABD:  Positive bowel sounds normal in frequency in pitch, no bruits, no rebound, no guarding, unable to assess midline mass or bruit with the patient seated. EXT:  2 plus pulses throughout, moderate edema, no cyanosis no clubbing SKIN:  No rashes no  nodules NEURO:  Cranial nerves II through XII grossly intact, motor grossly intact throughout PSYCH:  Cognitively intact, oriented to person place and time   EKG:  EKG is not *** ordered today. ***  Recent Labs: 01/19/2016: Magnesium 2.2 08/03/2016: B Natriuretic Peptide 2,772.1 08/10/2016: ALT 24; Hemoglobin 10.3; Platelets 162 08/27/2016: BUN 30; Creatinine, Ser 1.36; Potassium 4.3; Sodium 137    Lipid Panel    Component Value Date/Time   CHOL 159 10/27/2015 0515   TRIG 245 (H) 10/27/2015 0515   HDL 18 (L) 10/27/2015 0515   CHOLHDL 8.8 10/27/2015 0515   VLDL 49 (H) 10/27/2015 0515   LDLCALC 92 10/27/2015 0515      Wt Readings from Last 3 Encounters:  09/17/16 128 lb (58.1 kg)  09/10/16 124 lb (56.2 kg)  09/04/16 126 lb 6.4 oz (57.3 kg)      Other studies Reviewed: Additional studies/ records that were reviewed today include:   *** Review of the above records demonstrates:  ***   ASSESSMENT AND PLAN:  ACUTE ON CHRONIC SYSTOLIC AND DIASTOLIC HF:   He has not wanted hospice care.  ***  I had a discussion with his daughter and I'm going to get hospice nurses into the house. He was very hypotensive today. His blood pressure dropped into the 70s and he became somnolent while I was talking to him. I laid him down for his feet up. He responded quickly blood pressure came up to 92. His oxygen saturation was fine. He was never in distress. We will stop his spironolactone and Cozaar for now but unlikely to restart these in the future. However, he needs frequent nursing visits and blood pressure checks and it would be nice to have some home blood work provide him the opportunity to stay out all the hospital.  CKD:  ***  I will check a basic metabolic profile today.  HTN:  ***  This is being managed in the context of treating his CHF  HYPONATREMIA:   ***  He has  had hyponatremia. I will check a basic metabolic profile today.   Current medicines are reviewed at length with the  patient today.  The patient does not have concerns regarding medicines.  The following changes have been made:  ***  Labs/ tests ordered today include: *** No orders of the defined types were placed in this encounter.    Disposition:   FU with me in *** months.     Signed, [name] Breeding, MD  10/28/2016 12:43 PM    [place]

## 2016-10-29 ENCOUNTER — Ambulatory Visit: Payer: Medicare Other | Admitting: Cardiology

## 2016-10-30 DIAGNOSIS — I482 Chronic atrial fibrillation: Secondary | ICD-10-CM | POA: Diagnosis not present

## 2016-10-30 DIAGNOSIS — N183 Chronic kidney disease, stage 3 (moderate): Secondary | ICD-10-CM | POA: Diagnosis not present

## 2016-10-30 DIAGNOSIS — D638 Anemia in other chronic diseases classified elsewhere: Secondary | ICD-10-CM | POA: Diagnosis not present

## 2016-10-30 DIAGNOSIS — Z950 Presence of cardiac pacemaker: Secondary | ICD-10-CM | POA: Diagnosis not present

## 2016-10-30 DIAGNOSIS — E871 Hypo-osmolality and hyponatremia: Secondary | ICD-10-CM | POA: Diagnosis not present

## 2016-10-30 DIAGNOSIS — J449 Chronic obstructive pulmonary disease, unspecified: Secondary | ICD-10-CM | POA: Diagnosis not present

## 2016-10-30 DIAGNOSIS — J9620 Acute and chronic respiratory failure, unspecified whether with hypoxia or hypercapnia: Secondary | ICD-10-CM | POA: Diagnosis not present

## 2016-10-30 DIAGNOSIS — Z9981 Dependence on supplemental oxygen: Secondary | ICD-10-CM | POA: Diagnosis not present

## 2016-10-30 DIAGNOSIS — J628 Pneumoconiosis due to other dust containing silica: Secondary | ICD-10-CM | POA: Diagnosis not present

## 2016-10-30 DIAGNOSIS — E1122 Type 2 diabetes mellitus with diabetic chronic kidney disease: Secondary | ICD-10-CM | POA: Diagnosis not present

## 2016-10-30 DIAGNOSIS — I255 Ischemic cardiomyopathy: Secondary | ICD-10-CM | POA: Diagnosis not present

## 2016-10-30 DIAGNOSIS — I130 Hypertensive heart and chronic kidney disease with heart failure and stage 1 through stage 4 chronic kidney disease, or unspecified chronic kidney disease: Secondary | ICD-10-CM | POA: Diagnosis not present

## 2016-10-30 DIAGNOSIS — I5043 Acute on chronic combined systolic (congestive) and diastolic (congestive) heart failure: Secondary | ICD-10-CM | POA: Diagnosis not present

## 2016-11-01 DIAGNOSIS — E1122 Type 2 diabetes mellitus with diabetic chronic kidney disease: Secondary | ICD-10-CM | POA: Diagnosis not present

## 2016-11-01 DIAGNOSIS — N183 Chronic kidney disease, stage 3 (moderate): Secondary | ICD-10-CM | POA: Diagnosis not present

## 2016-11-01 DIAGNOSIS — Z9981 Dependence on supplemental oxygen: Secondary | ICD-10-CM | POA: Diagnosis not present

## 2016-11-01 DIAGNOSIS — J449 Chronic obstructive pulmonary disease, unspecified: Secondary | ICD-10-CM | POA: Diagnosis not present

## 2016-11-01 DIAGNOSIS — E871 Hypo-osmolality and hyponatremia: Secondary | ICD-10-CM | POA: Diagnosis not present

## 2016-11-01 DIAGNOSIS — D638 Anemia in other chronic diseases classified elsewhere: Secondary | ICD-10-CM | POA: Diagnosis not present

## 2016-11-01 DIAGNOSIS — I5043 Acute on chronic combined systolic (congestive) and diastolic (congestive) heart failure: Secondary | ICD-10-CM | POA: Diagnosis not present

## 2016-11-01 DIAGNOSIS — C61 Malignant neoplasm of prostate: Secondary | ICD-10-CM | POA: Diagnosis not present

## 2016-11-01 DIAGNOSIS — I482 Chronic atrial fibrillation: Secondary | ICD-10-CM | POA: Diagnosis not present

## 2016-11-01 DIAGNOSIS — I130 Hypertensive heart and chronic kidney disease with heart failure and stage 1 through stage 4 chronic kidney disease, or unspecified chronic kidney disease: Secondary | ICD-10-CM | POA: Diagnosis not present

## 2016-11-01 DIAGNOSIS — J628 Pneumoconiosis due to other dust containing silica: Secondary | ICD-10-CM | POA: Diagnosis not present

## 2016-11-01 DIAGNOSIS — I255 Ischemic cardiomyopathy: Secondary | ICD-10-CM | POA: Diagnosis not present

## 2016-11-01 DIAGNOSIS — N401 Enlarged prostate with lower urinary tract symptoms: Secondary | ICD-10-CM | POA: Diagnosis not present

## 2016-11-01 DIAGNOSIS — J9620 Acute and chronic respiratory failure, unspecified whether with hypoxia or hypercapnia: Secondary | ICD-10-CM | POA: Diagnosis not present

## 2016-11-01 DIAGNOSIS — Z950 Presence of cardiac pacemaker: Secondary | ICD-10-CM | POA: Diagnosis not present

## 2016-11-05 ENCOUNTER — Encounter: Payer: Self-pay | Admitting: Cardiology

## 2016-11-05 DIAGNOSIS — I509 Heart failure, unspecified: Secondary | ICD-10-CM | POA: Diagnosis not present

## 2016-11-07 ENCOUNTER — Encounter (HOSPITAL_COMMUNITY): Payer: Self-pay | Admitting: *Deleted

## 2016-11-07 ENCOUNTER — Emergency Department (HOSPITAL_COMMUNITY): Payer: Medicare Other

## 2016-11-07 ENCOUNTER — Observation Stay (HOSPITAL_COMMUNITY)
Admission: EM | Admit: 2016-11-07 | Discharge: 2016-11-10 | Disposition: A | Discharge status: Home / Self Care | Payer: Medicare Other | Attending: Family Medicine | Admitting: Family Medicine

## 2016-11-07 DIAGNOSIS — Z8042 Family history of malignant neoplasm of prostate: Secondary | ICD-10-CM | POA: Insufficient documentation

## 2016-11-07 DIAGNOSIS — Z95810 Presence of automatic (implantable) cardiac defibrillator: Secondary | ICD-10-CM | POA: Diagnosis not present

## 2016-11-07 DIAGNOSIS — I5022 Chronic systolic (congestive) heart failure: Secondary | ICD-10-CM | POA: Diagnosis present

## 2016-11-07 DIAGNOSIS — I495 Sick sinus syndrome: Secondary | ICD-10-CM | POA: Diagnosis not present

## 2016-11-07 DIAGNOSIS — E7800 Pure hypercholesterolemia, unspecified: Secondary | ICD-10-CM | POA: Insufficient documentation

## 2016-11-07 DIAGNOSIS — Z9981 Dependence on supplemental oxygen: Secondary | ICD-10-CM | POA: Diagnosis not present

## 2016-11-07 DIAGNOSIS — I6529 Occlusion and stenosis of unspecified carotid artery: Secondary | ICD-10-CM | POA: Diagnosis not present

## 2016-11-07 DIAGNOSIS — Z833 Family history of diabetes mellitus: Secondary | ICD-10-CM | POA: Insufficient documentation

## 2016-11-07 DIAGNOSIS — Z820 Family history of epilepsy and other diseases of the nervous system: Secondary | ICD-10-CM | POA: Insufficient documentation

## 2016-11-07 DIAGNOSIS — E875 Hyperkalemia: Secondary | ICD-10-CM | POA: Insufficient documentation

## 2016-11-07 DIAGNOSIS — Z79899 Other long term (current) drug therapy: Secondary | ICD-10-CM | POA: Insufficient documentation

## 2016-11-07 DIAGNOSIS — Z85118 Personal history of other malignant neoplasm of bronchus and lung: Secondary | ICD-10-CM | POA: Insufficient documentation

## 2016-11-07 DIAGNOSIS — I130 Hypertensive heart and chronic kidney disease with heart failure and stage 1 through stage 4 chronic kidney disease, or unspecified chronic kidney disease: Secondary | ICD-10-CM | POA: Insufficient documentation

## 2016-11-07 DIAGNOSIS — T148XXA Other injury of unspecified body region, initial encounter: Secondary | ICD-10-CM | POA: Diagnosis not present

## 2016-11-07 DIAGNOSIS — J60 Coalworker's pneumoconiosis: Secondary | ICD-10-CM | POA: Insufficient documentation

## 2016-11-07 DIAGNOSIS — Z86718 Personal history of other venous thrombosis and embolism: Secondary | ICD-10-CM | POA: Insufficient documentation

## 2016-11-07 DIAGNOSIS — Z9842 Cataract extraction status, left eye: Secondary | ICD-10-CM | POA: Insufficient documentation

## 2016-11-07 DIAGNOSIS — R55 Syncope and collapse: Secondary | ICD-10-CM | POA: Diagnosis not present

## 2016-11-07 DIAGNOSIS — R42 Dizziness and giddiness: Secondary | ICD-10-CM | POA: Diagnosis not present

## 2016-11-07 DIAGNOSIS — E119 Type 2 diabetes mellitus without complications: Secondary | ICD-10-CM

## 2016-11-07 DIAGNOSIS — E785 Hyperlipidemia, unspecified: Secondary | ICD-10-CM | POA: Insufficient documentation

## 2016-11-07 DIAGNOSIS — D539 Nutritional anemia, unspecified: Secondary | ICD-10-CM | POA: Diagnosis not present

## 2016-11-07 DIAGNOSIS — I255 Ischemic cardiomyopathy: Secondary | ICD-10-CM | POA: Diagnosis not present

## 2016-11-07 DIAGNOSIS — Z8673 Personal history of transient ischemic attack (TIA), and cerebral infarction without residual deficits: Secondary | ICD-10-CM | POA: Insufficient documentation

## 2016-11-07 DIAGNOSIS — I4510 Unspecified right bundle-branch block: Secondary | ICD-10-CM | POA: Insufficient documentation

## 2016-11-07 DIAGNOSIS — R531 Weakness: Secondary | ICD-10-CM

## 2016-11-07 DIAGNOSIS — Z955 Presence of coronary angioplasty implant and graft: Secondary | ICD-10-CM | POA: Insufficient documentation

## 2016-11-07 DIAGNOSIS — N183 Chronic kidney disease, stage 3 unspecified: Secondary | ICD-10-CM | POA: Diagnosis present

## 2016-11-07 DIAGNOSIS — M109 Gout, unspecified: Secondary | ICD-10-CM | POA: Insufficient documentation

## 2016-11-07 DIAGNOSIS — Z7901 Long term (current) use of anticoagulants: Secondary | ICD-10-CM | POA: Insufficient documentation

## 2016-11-07 DIAGNOSIS — Z951 Presence of aortocoronary bypass graft: Secondary | ICD-10-CM | POA: Insufficient documentation

## 2016-11-07 DIAGNOSIS — J449 Chronic obstructive pulmonary disease, unspecified: Secondary | ICD-10-CM

## 2016-11-07 DIAGNOSIS — M79652 Pain in left thigh: Secondary | ICD-10-CM | POA: Diagnosis not present

## 2016-11-07 DIAGNOSIS — Z9841 Cataract extraction status, right eye: Secondary | ICD-10-CM | POA: Insufficient documentation

## 2016-11-07 DIAGNOSIS — I482 Chronic atrial fibrillation, unspecified: Secondary | ICD-10-CM | POA: Diagnosis present

## 2016-11-07 DIAGNOSIS — I2510 Atherosclerotic heart disease of native coronary artery without angina pectoris: Secondary | ICD-10-CM | POA: Diagnosis not present

## 2016-11-07 DIAGNOSIS — I350 Nonrheumatic aortic (valve) stenosis: Secondary | ICD-10-CM | POA: Insufficient documentation

## 2016-11-07 DIAGNOSIS — E871 Hypo-osmolality and hyponatremia: Secondary | ICD-10-CM | POA: Diagnosis present

## 2016-11-07 DIAGNOSIS — E1122 Type 2 diabetes mellitus with diabetic chronic kidney disease: Secondary | ICD-10-CM | POA: Diagnosis not present

## 2016-11-07 DIAGNOSIS — I951 Orthostatic hypotension: Secondary | ICD-10-CM | POA: Diagnosis not present

## 2016-11-07 DIAGNOSIS — J9611 Chronic respiratory failure with hypoxia: Secondary | ICD-10-CM | POA: Diagnosis present

## 2016-11-07 DIAGNOSIS — W19XXXA Unspecified fall, initial encounter: Secondary | ICD-10-CM | POA: Insufficient documentation

## 2016-11-07 DIAGNOSIS — Z87891 Personal history of nicotine dependence: Secondary | ICD-10-CM | POA: Insufficient documentation

## 2016-11-07 DIAGNOSIS — I998 Other disorder of circulatory system: Secondary | ICD-10-CM | POA: Insufficient documentation

## 2016-11-07 DIAGNOSIS — S50312A Abrasion of left elbow, initial encounter: Secondary | ICD-10-CM | POA: Diagnosis not present

## 2016-11-07 DIAGNOSIS — D61818 Other pancytopenia: Secondary | ICD-10-CM | POA: Diagnosis present

## 2016-11-07 DIAGNOSIS — I5042 Chronic combined systolic (congestive) and diastolic (congestive) heart failure: Secondary | ICD-10-CM | POA: Diagnosis not present

## 2016-11-07 DIAGNOSIS — C61 Malignant neoplasm of prostate: Secondary | ICD-10-CM | POA: Insufficient documentation

## 2016-11-07 DIAGNOSIS — M25552 Pain in left hip: Secondary | ICD-10-CM | POA: Diagnosis not present

## 2016-11-07 DIAGNOSIS — K219 Gastro-esophageal reflux disease without esophagitis: Secondary | ICD-10-CM | POA: Diagnosis not present

## 2016-11-07 DIAGNOSIS — Z87442 Personal history of urinary calculi: Secondary | ICD-10-CM | POA: Insufficient documentation

## 2016-11-07 LAB — CUP PACEART REMOTE DEVICE CHECK
Brady Statistic RV Percent Paced: 93 %
Date Time Interrogation Session: 20180829200017
Implantable Lead Implant Date: 20080423
Implantable Lead Location: 753860
Implantable Lead Model: 1581
Implantable Pulse Generator Implant Date: 20150923
Lead Channel Impedance Value: 550 Ohm
Lead Channel Impedance Value: 660 Ohm
Lead Channel Sensing Intrinsic Amplitude: 12 mV
Lead Channel Setting Pacing Amplitude: 3.25 V
Lead Channel Setting Pacing Pulse Width: 0.5 ms
MDC IDC LEAD IMPLANT DT: 20050706
MDC IDC LEAD LOCATION: 753858
MDC IDC MSMT LEADCHNL LV PACING THRESHOLD AMPLITUDE: 2.25 V
MDC IDC MSMT LEADCHNL LV PACING THRESHOLD PULSEWIDTH: 0.8 ms
MDC IDC SET LEADCHNL LV PACING PULSEWIDTH: 0.8 ms
MDC IDC SET LEADCHNL RV PACING AMPLITUDE: 2.5 V
MDC IDC SET LEADCHNL RV SENSING SENSITIVITY: 2 mV
Pulse Gen Model: 3222
Pulse Gen Serial Number: 7634311

## 2016-11-07 LAB — BASIC METABOLIC PANEL
Anion gap: 10 (ref 5–15)
BUN: 23 mg/dL — AB (ref 6–20)
CHLORIDE: 94 mmol/L — AB (ref 101–111)
CO2: 27 mmol/L (ref 22–32)
Calcium: 9 mg/dL (ref 8.9–10.3)
Creatinine, Ser: 1.38 mg/dL — ABNORMAL HIGH (ref 0.61–1.24)
GFR calc Af Amer: 53 mL/min — ABNORMAL LOW (ref >60)
GFR calc non Af Amer: 46 mL/min — ABNORMAL LOW (ref >60)
Glucose, Bld: 134 mg/dL — ABNORMAL HIGH (ref 65–99)
POTASSIUM: 4.3 mmol/L (ref 3.5–5.1)
SODIUM: 131 mmol/L — AB (ref 135–145)

## 2016-11-07 LAB — CBC
HEMATOCRIT: 26 % — AB (ref 39.0–52.0)
Hemoglobin: 8.4 g/dL — ABNORMAL LOW (ref 13.0–17.0)
MCH: 33.9 pg (ref 26.0–34.0)
MCHC: 32.3 g/dL (ref 30.0–36.0)
MCV: 104.8 fL — AB (ref 78.0–100.0)
Platelets: 135 10*3/uL — ABNORMAL LOW (ref 150–400)
RBC: 2.48 MIL/uL — AB (ref 4.22–5.81)
RDW: 18.5 % — AB (ref 11.5–15.5)
WBC: 2.7 10*3/uL — AB (ref 4.0–10.5)

## 2016-11-07 LAB — CBG MONITORING, ED: GLUCOSE-CAPILLARY: 95 mg/dL (ref 65–99)

## 2016-11-07 LAB — URINALYSIS, ROUTINE W REFLEX MICROSCOPIC
BACTERIA UA: NONE SEEN
BILIRUBIN URINE: NEGATIVE
Glucose, UA: NEGATIVE mg/dL
KETONES UR: NEGATIVE mg/dL
LEUKOCYTES UA: NEGATIVE
Nitrite: NEGATIVE
PROTEIN: NEGATIVE mg/dL
SQUAMOUS EPITHELIAL / LPF: NONE SEEN
Specific Gravity, Urine: 1.006 (ref 1.005–1.030)
WBC UA: NONE SEEN WBC/hpf (ref 0–5)
pH: 5 (ref 5.0–8.0)

## 2016-11-07 LAB — BRAIN NATRIURETIC PEPTIDE: B Natriuretic Peptide: 1619.3 pg/mL — ABNORMAL HIGH (ref 0.0–100.0)

## 2016-11-07 LAB — TROPONIN I: Troponin I: <0.03 ng/mL (ref <0.03)

## 2016-11-07 MED ORDER — ISOSORBIDE MONONITRATE ER 30 MG PO TB24
15.0000 mg | ORAL_TABLET | Freq: Every day | ORAL | Status: DC
  Filled 2016-11-07: qty 1

## 2016-11-07 MED ORDER — CALCIUM CARBONATE-VITAMIN D 500-200 MG-UNIT PO TABS
1.0000 | ORAL_TABLET | Freq: Two times a day (BID) | ORAL | Status: DC
  Administered 2016-11-07 – 2016-11-10 (×6): 1 via ORAL
  Filled 2016-11-07 (×6): qty 1

## 2016-11-07 MED ORDER — HYDROCODONE-ACETAMINOPHEN 5-325 MG PO TABS: 1.0000 | ORAL_TABLET | ORAL | Status: DC | PRN

## 2016-11-07 MED ORDER — PANTOPRAZOLE SODIUM 40 MG PO TBEC
40.0000 mg | DELAYED_RELEASE_TABLET | Freq: Every day | ORAL | Status: DC
  Administered 2016-11-08 – 2016-11-10 (×3): 40 mg via ORAL
  Filled 2016-11-07 (×3): qty 1

## 2016-11-07 MED ORDER — ALBUTEROL SULFATE (2.5 MG/3ML) 0.083% IN NEBU: 2.5000 mg | INHALATION_SOLUTION | Freq: Four times a day (QID) | RESPIRATORY_TRACT | Status: DC | PRN

## 2016-11-07 MED ORDER — APIXABAN 5 MG PO TABS
5.0000 mg | ORAL_TABLET | Freq: Every day | ORAL | Status: DC
  Administered 2016-11-07: 5 mg via ORAL
  Filled 2016-11-07: qty 1

## 2016-11-07 MED ORDER — SODIUM CHLORIDE 0.9 % IV SOLN: 250.0000 mL | INTRAVENOUS | Status: DC | PRN

## 2016-11-07 MED ORDER — ACETAMINOPHEN 650 MG RE SUPP: 650.0000 mg | Freq: Four times a day (QID) | RECTAL | Status: DC | PRN

## 2016-11-07 MED ORDER — ACETAMINOPHEN 325 MG PO TABS
650.0000 mg | ORAL_TABLET | Freq: Four times a day (QID) | ORAL | Status: DC | PRN
Start: 2016-11-07 — End: 2016-11-10

## 2016-11-07 MED ORDER — VITAMIN B-12 1000 MCG PO TABS
1000.0000 ug | ORAL_TABLET | Freq: Two times a day (BID) | ORAL | Status: DC
  Administered 2016-11-07 – 2016-11-10 (×6): 1000 ug via ORAL
  Filled 2016-11-07 (×6): qty 1

## 2016-11-07 MED ORDER — FENTANYL CITRATE (PF) 100 MCG/2ML IJ SOLN
12.5000 ug | Freq: Once | INTRAMUSCULAR | Status: AC
  Administered 2016-11-07: 12.5 ug via INTRAVENOUS
  Filled 2016-11-07: qty 2

## 2016-11-07 MED ORDER — SENNOSIDES-DOCUSATE SODIUM 8.6-50 MG PO TABS: 1.0000 | ORAL_TABLET | Freq: Every evening | ORAL | Status: DC | PRN

## 2016-11-07 MED ORDER — OMEGA-3-ACID ETHYL ESTERS 1 G PO CAPS
1.0000 g | ORAL_CAPSULE | Freq: Every day | ORAL | Status: DC
  Administered 2016-11-08 – 2016-11-10 (×3): 1 g via ORAL
  Filled 2016-11-07 (×3): qty 1

## 2016-11-07 MED ORDER — TAMSULOSIN HCL 0.4 MG PO CAPS
0.4000 mg | ORAL_CAPSULE | Freq: Every day | ORAL | Status: DC
  Administered 2016-11-07 – 2016-11-09 (×3): 0.4 mg via ORAL
  Filled 2016-11-07 (×3): qty 1

## 2016-11-07 MED ORDER — ONDANSETRON HCL 4 MG/2ML IJ SOLN: 4.0000 mg | Freq: Four times a day (QID) | INTRAMUSCULAR | Status: DC | PRN

## 2016-11-07 MED ORDER — LORATADINE 10 MG PO TABS
10.0000 mg | ORAL_TABLET | Freq: Every day | ORAL | Status: DC
  Administered 2016-11-08 – 2016-11-10 (×3): 10 mg via ORAL
  Filled 2016-11-07 (×3): qty 1

## 2016-11-07 MED ORDER — ALLOPURINOL 300 MG PO TABS
300.0000 mg | ORAL_TABLET | Freq: Every day | ORAL | Status: DC
  Administered 2016-11-08 – 2016-11-10 (×3): 300 mg via ORAL
  Filled 2016-11-07 (×3): qty 1

## 2016-11-07 MED ORDER — SODIUM CHLORIDE 0.9% FLUSH
3.0000 mL | INTRAVENOUS | Status: DC | PRN
  Administered 2016-11-08: 3 mL via INTRAVENOUS
  Filled 2016-11-07: qty 3

## 2016-11-07 MED ORDER — NITROGLYCERIN 0.4 MG SL SUBL: 0.4000 mg | SUBLINGUAL_TABLET | SUBLINGUAL | Status: DC | PRN

## 2016-11-07 MED ORDER — ONDANSETRON HCL 4 MG PO TABS: 4.0000 mg | ORAL_TABLET | Freq: Four times a day (QID) | ORAL | Status: DC | PRN

## 2016-11-07 MED ORDER — SODIUM CHLORIDE 0.9% FLUSH
3.0000 mL | Freq: Two times a day (BID) | INTRAVENOUS | Status: DC
  Administered 2016-11-08: 3 mL via INTRAVENOUS

## 2016-11-07 MED ORDER — SODIUM CHLORIDE 0.9% FLUSH
3.0000 mL | Freq: Two times a day (BID) | INTRAVENOUS | Status: DC
  Administered 2016-11-07 – 2016-11-09 (×4): 3 mL via INTRAVENOUS

## 2016-11-07 NOTE — ED Triage Notes (Signed)
To ED from home via [facility] for eval of near syncope. Pt has been weak for a couple of weeks. Recently had blood pressure meds decreased due to same. Pt without complaints. Alert and oriented. States when getting up from his hospital bed to go to fridge he became dizzy and felt like he was going to pass out. No cp. No sob. No complaints now

## 2016-11-07 NOTE — ED Notes (Signed)
Pacemaker interrogated again, other trasmission did not go through

## 2016-11-07 NOTE — ED Notes (Signed)
Patient transported to X-ray 

## 2016-11-07 NOTE — ED Notes (Signed)
Pt on 2L [place] all the time

## 2016-11-07 NOTE — ED Notes (Signed)
Pts pacemaker interrogated  

## 2016-11-07 NOTE — Progress Notes (Signed)
New Admission Note: Pt admitted to 2W03  Arrival Method: via stretcher Mental Orientation: alert and oriented x 3 Telemetry: Per order Assessment: Completed Skin: Intact IV: R hand SL Pain: Denies Tubes: None Safety Measures: Safety Fall Prevention Plan has been discussed  Admission: To be completed 6 [place] Orientation: Patient has been orientated to the room, unit and staff.  Family: Daughter at bedside  Orders to be reviewed and implemented. Will continue to monitor the patient. Call light has been placed within reach and bed alarm has been activated.   Mady Gemma, BSN, RN-BC Phone: [phone]

## 2016-11-07 NOTE — H&P (Signed)
History and Physical    [name] FIE:[id number] DOB: 11-13-1932 DOA: 11/07/2016  PCP: [name], MD   Patient coming from: Home  Chief Complaint: Syncope, gen weakness   HPI: [name] is a 83 y.o. male with medical history significant for coronary artery disease status post CABG, ischemic cardiomyopathy with EF 10-15%, chronic atrial fibrillation on Eliquis, and chronic kidney disease stage III, now presenting to the emergency department after a syncopal episode at home. Patient reports generalized weakness for the past couple weeks and notes that his blood pressure medications have been reduced after following up with his outpatient providers for this. He reports feeling weak when he went to bed last night, but was otherwise well. He got up from bed this morning, picked up his teeth, and walked to the refrigerator, but by the time he arrived there was severely lightheaded and experienced a transient loss of consciousness. He reports he is hanging onto the refrigerator door handle and slid to the ground. He reported some left hip and elbow pain after the event, but otherwise returned to his usual state quickly. EMS was called out for evaluation and he was transported to the hospital.    ED Course: Upon arrival to the ED, patient is found to be afebrile, saturating well on room air, and with vitals otherwise stable. There was a 29 mmHg drop in his systolic blood pressure from lying to standing. EKG features atrial fibrillation with PVCs and RBBB. Chest x-ray is notable for stable cardiomegaly and mild pulmonary vascular congestion without overt edema or focal consolidation. Radiographs of the hips and left elbow are negative. Noncontrast head CT is negative for acute intracranial abnormality. Chemistry panel reveals a sodium of 131, BUN 23, and creatinine 1.38 which appears consistent with his baseline. CBC is notable for a chronic stable pancytopenia with WBC 2700, hemoglobin 8.4, platelets  135,000, and MCV 104.8. BNP is elevated at 1619, troponin is undetectable, and urinalysis is unremarkable. Patient's pacer was interrogated in the emergency department and he was treated with IV fentanyl. He remained hemodynamically stable while at rest in bed, has not been in any apparent respiratory distress, and will be observed on the telemetry unit for ongoing evaluation and management of a syncopal episode with orthostasis.   Review of Systems:  All other systems reviewed and apart from HPI, are negative.  Past Medical History:  Diagnosis Date  . Aortic stenosis   . Arthritis   . Black lung disease ([place])   . CAD (coronary artery disease)    a. 07/2002 CABG x 3: LIMA->LAD, VG->Diag, VG->OM;  b. 06/2006 Cath: LM 50-60ost/p, LAD patent mid stent, D1 sev dzs, D2 patent stent, LCX nl, OM2 sev sten prox, RCA large/nl, VG->Diag nl, VG->OM nl, LIMA->LAD atretic, EF 30%.  . Carotid artery occlusion   . Chronic atrial fibrillation ([place])   . Chronic respiratory failure ([place])   . Chronic systolic CHF (congestive heart failure) ([place])    a. 12/2012 Echo: EF 20-25%, mid-dist antsept AK, mod dil LA. b. Echo 01/2016: EF 10-15%, also severe RV dysfunction, mild AS (may be underrepresented).  . COPD (chronic obstructive pulmonary disease) ([place])    a. On home O2.  [name] CVA (cerebral vascular accident) ([place])   . Diabetes mellitus   . GERD (gastroesophageal reflux disease)   . GI bleed 01/2016  . Gout   . Hypercholesterolemia   . Hypertension   . Ischemia of lower extremity    a. s/p thrombectomy 01/2016.  [name]  Ischemic cardiomyopathy    a. 06/2006  BIV-ICD -> gen change 2015 to [place].  . Lung cancer ([place])   . Nephrolithiasis 09/2000  . On home oxygen therapy    "2.5L; 24/7" (07/25/2016)  . Prostate cancer ([place]) 11/01/10   gleason 7, 8, 9, gold seeds 02/08/11  . Symptomatic bradycardia     Past Surgical History:  Procedure Laterality Date  . BI-VENTRICULAR PACEMAKER INSERTION (CRT-P)  12-02-13     downgrade of previously implanted CRTD to STJ CRTP  . BIV PACEMAKER GENERATOR CHANGE OUT N/A 12/02/2013   Procedure: BIV PACEMAKER GENERATOR CHANGE OUT;  Surgeon: Evans Lance, MD;  Location[facility] C[facility] LAB;  Service: Cardiovascular;  Laterality: N/A;  . CATARACT EXTRACTION W/ INTRAOCULAR LENS  IMPLANT, BILATERAL Bilateral   . COLONOSCOPY    . CORONARY ANGIOPLASTY WITH STENT PLACEMENT  07/1997; 08/1997;03/1998  . CORONARY ARTERY BYPASS GRAFT  07/2002   CABG X3  . ESOPHAGOGASTRODUODENOSCOPY  02/19/2011   Procedure: ESOPHAGOGASTRODUODENOSCOPY (EGD);  Surgeon: Jeryl Columbia, MD;  Location: [facility] ENDOSCOPY;  Service: Endoscopy;  Laterality: N/A;  . ESOPHAGOGASTRODUODENOSCOPY    . FEMORAL-POPLITEAL BYPASS GRAFT Right 01/20/2016   Procedure: THROMBECTOMY OF POPLITEAL ARTERY AND ANTERIOR TIBIAL ARTERY RIGHT LEG; INTRAOPERATIVE ARTERIOGRAM;  Surgeon: Waynetta Sandy, MD;  Location: [facility];  Service: Vascular;  Laterality: Right;  . INCISION AND DRAINAGE OF WOUND  08/2002   right thigh; S/P EVH  . INSERT / REPLACE / [place]; 1992; 01/2000;  . INSERT / REPLACE / REMOVE PACEMAKER  09/2003; 06/2006   w/AICD  . INSERT / REPLACE / REMOVE PACEMAKER  12/2004   pacmaker explant  . SHOULDER ARTHROSCOPY W/ ROTATOR CUFF REPAIR  05/2008   left     reports that he quit smoking about 67 years ago. His smoking use included Cigarettes. He smoked 0.25 packs per day. His smokeless tobacco use includes Chew. He reports that he does not drink alcohol or use drugs.  Allergies  Allergen Reactions  . [place] [Digoxin] Other (See Comments)    Caused patient to have chest pains and dizzy spells  . Aspirin Hives  . Penicillins Rash    Rash Has patient had a PCN reaction causing immediate rash, facial/tongue/throat swelling, SOB or lightheadedness with hypotension:YES Has patient had a PCN reaction causing severe rash involving mucus membranes or skin necrosis: NO Has patient had a PCN reaction that  required hospitalization NO Has patient had a PCN reaction occurring within the last 10 years:NO If all of the above answers are "NO", then may proceed with Cephalosporin use.    Family History  Problem Relation Age of Onset  . Alzheimer's disease Father 16  . Cancer Father 14       metastatic prostate cancer  . Diabetes Sister 5  . Diabetes Brother   . Diabetes Brother   . Diabetes Brother   . Hypotension Neg Hx   . Malignant hyperthermia Neg Hx   . Pseudochol deficiency Neg Hx      Prior to Admission medications   Medication Sig Start Date End Date Taking? Authorizing Provider  albuterol (PROVENTIL HFA;VENTOLIN HFA) 108 (90 Base) MCG/ACT inhaler Inhale 2 puffs into the lungs every 4 (four) hours as needed for wheezing or shortness of breath. 06/10/15  Yes Joy, Shawn C, PA-C  albuterol (PROVENTIL) (2.5 MG/3ML) 0.083% nebulizer solution Take 2.5 mg by nebulization every 6 (six) hours as needed for wheezing or shortness of breath.   Yes [provider]  allopurinol (ZYLOPRIM) 300 MG tablet Take 300 mg by mouth daily.     Yes [provider]  CALCIUM-VITAMIN D PO Take 1 tablet by mouth 2 (two) times daily.    Yes [provider]  ELIQUIS 5 MG TABS tablet Take 5 mg by mouth at bedtime.  04/06/16  Yes [provider]  fexofenadine (ALLEGRA) 180 MG tablet Take 180 mg by mouth daily.   Yes [provider]  isosorbide mononitrate (IMDUR) 30 MG 24 hr tablet Take 15 mg by mouth daily.  04/04/16  Yes [provider]  nitroGLYCERIN (NITROSTAT) 0.4 MG SL tablet Place 0.4 mg under the tongue every 5 (five) minutes as needed for chest pain.   Yes [provider]  Omega-3 Fatty Acids (FISH OIL) 1000 MG CAPS Take 1,000 mg by mouth at bedtime.    Yes [provider]  omeprazole (PRILOSEC) 40 MG capsule Take 1 capsule (40 mg total) by mouth 2 (two) times daily. Patient taking differently: Take 20 mg by mouth 2 (two) times daily.   01/20/16  Yes Tat, Shanon Brow, MD  OXYGEN Inhale 2.5 L into the lungs continuous.   Yes [provider]  potassium chloride SA (K-DUR,KLOR-CON) 20 MEQ tablet Take 20 mEq by mouth daily. Take 1 additional tablet (41mEq) if needed for extra fluid. 10/08/16  Yes [provider]  tamsulosin (FLOMAX) 0.4 MG CAPS capsule Take 0.4 mg by mouth at bedtime. 11/01/16  Yes [provider]  torsemide (DEMADEX) 20 MG tablet Take 1 tablet (20 mg total) by mouth 2 (two) times daily. 08/13/16  Yes Dhungel, Nishant, MD  vitamin B-12 (CYANOCOBALAMIN) 1000 MCG tablet Take 1,000 mcg by mouth 2 (two) times daily.   Yes [provider]  feeding supplement, ENSURE ENLIVE, (ENSURE ENLIVE) LIQD Take 237 mLs by mouth 2 (two) times daily between meals. Patient not taking: Reported on 11/07/2016 07/26/16   Geradine Girt, DO  hydrocortisone cream 1 % Apply 1 application topically 2 (two) times daily as needed for itching. Patient not taking: Reported on 09/04/2016 07/26/16   Geradine Girt, DO  ondansetron (ZOFRAN ODT) 4 MG disintegrating tablet Take 1 tablet (4 mg total) by mouth every 8 (eight) hours as needed for nausea or vomiting. Patient not taking: Reported on 09/04/2016 07/07/16   Everlene Balls, MD  potassium chloride SA (K-DUR,KLOR-CON) 10 MEQ tablet Take 1 tablet (10 mEq total) by mouth daily. Patient not taking: Reported on 11/07/2016 04/25/16   Robbie Lis, MD    Physical Exam: Vitals:   11/07/16 1545 11/07/16 1600 11/07/16 1700 11/07/16 1815  BP: 110/66 (!) 125/59 110/67 107/61  Pulse: 68 69 (!) 58 66  Resp: 16 (!) 25 (!) 26 19  Temp:      TempSrc:      SpO2: 100% 100% 100% 100%      Constitutional: NAD, calm, chronically-ill appearing Eyes: PERTLA, lids and conjunctivae normal ENMT: Mucous membranes are moist. Posterior pharynx clear of any exudate or lesions.   Neck: normal, supple, no masses, no thyromegaly Respiratory: Diminished bilaterally, no wheezing, no crackles.  Normal respiratory effort.   Cardiovascular: S1 & S2 heard, regular rate and rhythm. No extremity edema. No significant JVD. Abdomen: No distension, no tenderness, no masses palpated. Bowel sounds normal.  Musculoskeletal: no clubbing / cyanosis. No joint deformity upper and lower extremities.   Skin: no significant rashes, lesions, ulcers. Warm, dry, well-perfused. Neurologic: CN 2-12 grossly intact. Sensation intact, DTR normal. Strength 5/5 in all 4  limbs.  Psychiatric: Alert and oriented x 3. Pleasant, cooperative.     Labs on Admission: I have personally reviewed following labs and imaging studies  CBC:  Recent Labs Lab 11/07/16 1440  WBC 2.7*  HGB 8.4*  HCT 26.0*  MCV 104.8*  PLT 245*   Basic Metabolic Panel:  Recent Labs Lab 11/07/16 1440  NA 131*  K 4.3  CL 94*  CO2 27  GLUCOSE 134*  BUN 23*  CREATININE 1.38*  CALCIUM 9.0   GFR: CrCl cannot be calculated (Unknown ideal weight.). Liver Function Tests: No results for input(s): AST, ALT, ALKPHOS, BILITOT, PROT, ALBUMIN in the last 168 hours. No results for input(s): LIPASE, AMYLASE in the last 168 hours. No results for input(s): AMMONIA in the last 168 hours. Coagulation Profile: No results for input(s): INR, PROTIME in the last 168 hours. Cardiac Enzymes:  Recent Labs Lab 11/07/16 1607  TROPONINI <0.03   BNP (last 3 results) No results for input(s): PROBNP in the last 8760 hours. HbA1C: No results for input(s): HGBA1C in the last 72 hours. CBG:  Recent Labs Lab 11/07/16 1607  GLUCAP 95   Lipid Profile: No results for input(s): CHOL, HDL, LDLCALC, TRIG, CHOLHDL, LDLDIRECT in the last 72 hours. Thyroid Function Tests: No results for input(s): TSH, T4TOTAL, FREET4, T3FREE, THYROIDAB in the last 72 hours. Anemia Panel: No results for input(s): VITAMINB12, FOLATE, FERRITIN, TIBC, IRON, RETICCTPCT in the last 72 hours. Urine analysis:    Component Value Date/Time   COLORURINE YELLOW 11/07/2016  1700   APPEARANCEUR CLEAR 11/07/2016 1700   LABSPEC 1.006 11/07/2016 1700   LABSPEC 1.015 05/03/2011 0950   PHURINE 5.0 11/07/2016 1700   GLUCOSEU NEGATIVE 11/07/2016 1700   HGBUR MODERATE (A) 11/07/2016 1700   BILIRUBINUR NEGATIVE 11/07/2016 1700   BILIRUBINUR Color Interference 05/03/2011 0950   KETONESUR NEGATIVE 11/07/2016 1700   PROTEINUR NEGATIVE 11/07/2016 1700   UROBILINOGEN 0.2 11/10/2014 1815   NITRITE NEGATIVE 11/07/2016 1700   LEUKOCYTESUR NEGATIVE 11/07/2016 1700   LEUKOCYTESUR Color Interference 05/03/2011 0950   Sepsis Labs: @LABRCNTIP (procalcitonin:4,lacticidven:4) )No results found for this or any previous visit (from the past 240 hour(s)).   Radiological Exams on Admission: Dg Chest 2 View  Result Date: 11/07/2016 CLINICAL DATA:  Syncope. EXAM: CHEST  2 VIEW COMPARISON:  08/17/2016 FINDINGS: Stable postsurgical changes from CABG. Stable positioning of pacemaker leads. Cardiomediastinal silhouette is moderately enlarged. Mediastinal contours appear intact. Calcific atherosclerotic disease and tortuosity of the aorta. There is no evidence of focal airspace consolidation, pleural effusion or pneumothorax. Mild pulmonary vascular congestion. Osseous structures are without acute abnormality. Soft tissues are grossly normal. IMPRESSION: Stably enlarged cardiac silhouette with mild pulmonary vascular congestion. No evidence of overt pulmonary edema or focal airspace consolidation. Electronically Signed   By: Fidela Salisbury M.D.   On: 11/07/2016 16:53   Dg Elbow Complete Left  Result Date: 11/07/2016 CLINICAL DATA:  Syncope today with abrasion of the left elbow after fall. EXAM: LEFT ELBOW - COMPLETE 3+ VIEW COMPARISON:  None. FINDINGS: There is no evidence of acute fracture, dislocation, or joint effusion. Small enthesophyte at the triceps insertion. Mild soft tissue swelling is noted posteriorly. There is no evidence of arthropathy or other focal bone abnormality. Soft  IMPRESSION: No acute fracture or dislocation of the left elbow. Mild dorsal soft tissue swelling with tiny olecranon spur. Electronically Signed   By: Ashley Royalty M.D.   On: 11/07/2016 16:53   Ct Head Wo Contrast  Result Date: 11/07/2016 CLINICAL DATA:  Generalize weakness for 2 weeks, syncopal episode EXAM: CT HEAD WITHOUT CONTRAST TECHNIQUE: Contiguous axial images were obtained from the base of the skull through the vertex without intravenous contrast. COMPARISON:  01/20/2016, 06/04/2013 FINDINGS: Brain: No acute territorial infarction, hemorrhage, or intracranial mass is seen. Small old infarct in the right cerebellum. Atrophy and mild small vessel ischemic changes of the white matter. Stable ventricle size. Vascular: No hyperdense vessels.  Carotid artery calcifications. Skull: No fracture or suspicious bone lesion. Sinuses/Orbits: Mild mucosal thickening in the ethmoid sinuses. No acute orbital abnormality. Bilateral lens extraction. Other: Stable coarse calcification in the left parotid space. IMPRESSION: 1. No CT evidence for acute intracranial abnormality. 2. Atrophy and mild small vessel ischemic changes of the white matter Electronically Signed   By: Donavan Foil M.D.   On: 11/07/2016 18:10   Dg Hip Unilat W Or Wo Pelvis 2-3 Views Left  Result Date: 11/07/2016 CLINICAL DATA:  Syncopal episode this morning. Left buttock an upper left femoral pain. EXAM: DG HIP (WITH OR WITHOUT PELVIS) 2-3V LEFT COMPARISON:  None. FINDINGS: Eccentric disc space narrowing is noted at L4-5 with disc space narrowing on the right. The bony pelvis appears intact. No pelvic diastasis. Three surgical clips project over the pubic symphysis. Hip joints are maintained bilaterally. No acute fracture nor dislocations. Soft tissue ossifications are present adjacent to the greater trochanter which may reflect stigmata of gluteal tendinopathy. Extensive vascular calcifications seen bilaterally along the course the internal and  external iliacs through femoral and branch vessel arteries. IMPRESSION: No acute osseous abnormality of the bony pelvis and left hip. Electronically Signed   By: Ashley Royalty M.D.   On: 11/07/2016 16:55    EKG: Independently reviewed. Atrial fibrillation, PVC's, RBBB.   Assessment/Plan  1. Syncope  - Pt presents following a syncopal episode - Orthostatic hypotension noted in ED and likely etiology  - BP meds recently reduced per pt report  - Appears roughly euvolemic, there is no hypotension while seated, and with severe ischemic CM (EF 10-15%), hesitant to give IVF - Plan to hold diuretic, SLIV, monitor on telemetry, update echo, start compression stockings, repeat orthostatic vitals tomorrow    2. Hyponatremia  - Serum sodium is 131 on admission - Appears to be chronic and likely secondary to CHF  - Appears roughly euvolemic  - Repeat chemistries in am   3. Chronic systolic CHF  - TTE (51/7/00) with EF 10-15%, diffuse HK, mild AS, mild MR, mild TR, massive LAE  - Managed at home with torsemide, held on admission as above  - Follow daily wts and I/O's; resume diuresis as appropriate   4. Chronic atrial fibrillation  - Presents with rate-controlled atrial fib - CHADS-VASc is at least 47 (age x2, CAD, CHF)  - Continue Eliquis    5. CKD stage III  - SCr is 1.38 on admission, consistent with his apparent baseline  - Avoid dehydration or hypotension, renally-dose medications prn  - Repeat chem panel in am -   6. COPD - Mild, treated with prn albuterol only at home - No wheezing or cough on admission  - Continue prn albuterol    DVT prophylaxis: Eliquis Code Status: DNR Family Communication: Discussed with patient Disposition Plan: Observe on telemetry Consults called: None Admission status: Observation    Vianne Bulls, MD [facility] Pager [phone]  If 7PM-7AM, please contact night-coverage [email or web address] Password [email or web address]  11/07/2016, 7:08 PM

## 2016-11-07 NOTE — ED Provider Notes (Signed)
[facility] DEPT Provider Note   CSN: [id number] Arrival date & time: 11/07/16  1327     History   Chief Complaint Chief Complaint  Patient presents with  . Near Syncope    HPI  [name] is an 83 year old male with a history of ischemic heart failure with EF 10-15%, A. Fib with pacemaker, COPD and Stroke, who presents today after a syncopal episode at 8 AM this morning at home. Patient also reports he got up to get something out of the refrigerator reached for the refrigerator and doesn't remember anything else but woke up on the floor, patient reports he did hit his head his left hand and left side. Patient is currently on Eliquis. Patient reports he has been feeling generally weak for the past few weeks and has been working with his primary doctor to decrease his  pressure medications. His daughters are with him and reported that last week he had some very low blood pressures with systolics in the 39J. Today he denies shortness of breath, chest pain, extremity swelling. Patient is on 2.5 L of oxygen chronically at home, he is not requiring any additional oxygen today. Patient is home alone for much of the day, daughters are sometimes present in the evenings. Patient has DO NOT RESUSCITATE order with him today.     Past Medical History:  Diagnosis Date  . Aortic stenosis   . Arthritis   . Black lung disease ([place])   . CAD (coronary artery disease)    a. 07/2002 CABG x 3: LIMA->LAD, VG->Diag, VG->OM;  b. 06/2006 Cath: LM 50-60ost/p, LAD patent mid stent, D1 sev dzs, D2 patent stent, LCX nl, OM2 sev sten prox, RCA large/nl, VG->Diag nl, VG->OM nl, LIMA->LAD atretic, EF 30%.  . Carotid artery occlusion   . Chronic atrial fibrillation ([place])   . Chronic respiratory failure ([place])   . Chronic systolic CHF (congestive heart failure) ([place])    a. 12/2012 Echo: EF 20-25%, mid-dist antsept AK, mod dil LA. b. Echo 01/2016: EF 10-15%, also severe RV dysfunction, mild AS (may be  underrepresented).  . COPD (chronic obstructive pulmonary disease) ([place])    a. On home O2.  [name] CVA (cerebral vascular accident) ([place])   . Diabetes mellitus   . GERD (gastroesophageal reflux disease)   . GI bleed 01/2016  . Gout   . Hypercholesterolemia   . Hypertension   . Ischemia of lower extremity    a. s/p thrombectomy 01/2016.  . Ischemic cardiomyopathy    a. 06/2006  BIV-ICD -> gen change 2015 to [place].  . Lung cancer ([place])   . Nephrolithiasis 09/2000  . On home oxygen therapy    "2.5L; 24/7" (07/25/2016)  . Prostate cancer ([place]) 11/01/10   gleason 7, 8, 9, gold seeds 02/08/11  . Symptomatic bradycardia     Patient Active Problem List   Diagnosis Date Noted  . COPD GOLD 0  08/18/2016  . Dyspnea on exertion 08/17/2016  . Hyponatremia 08/13/2016  . Palliative care by specialist   . Acute on chronic systolic CHF (congestive heart failure) ([place]) 07/24/2016  . Hyperkalemia 04/24/2016  . Hypotension 04/24/2016  . Goals of care, counseling/discussion   . CHF exacerbation ([place]) 04/02/2016  . Acute on chronic respiratory failure ([place]) 04/02/2016  . COPD exacerbation ([place]) 04/02/2016  . Macrocytic anemia 04/02/2016  . Arterial thrombosis ([place]) 01/20/2016  . DM2 (diabetes mellitus, type 2) ([place]) 01/20/2016  . Critical lower limb ischemia 01/20/2016  . Ischemic leg 01/20/2016  .  Chronic renal disease, stage III 01/19/2016  . Chronic anticoagulation-Xarelto 01/19/2016  . Cardiomyopathy, ischemic 01/19/2016  . Acute on chronic combined systolic and diastolic CHF (congestive heart failure) ([place]) 01/18/2016  . Anemia 01/16/2016  . Black lung disease ([place]) 10/27/2015  . Pain in the chest   . Hx of CABG x 3 2004 after multiple PCI's in'90s   . Chronic respiratory failure with hypoxia ([place]) 08/08/2013  . CAP (community acquired pneumonia) 08/07/2013  . Pneumonia 08/07/2013  . Occlusion and stenosis of carotid artery without mention of cerebral infarction 05/26/2013  .  Syncope 02/15/2013  . Weakness generalized 05/14/2011  . On home oxygen therapy   . Prostate cancer ([place])   . Pacemaker   . Hematochezia 02/19/2011  . Melena 02/19/2011  . Prostate cancer, recur risk not determined whether low, med or high ([place])   . Systolic CHF, chronic ([place]) 02/16/2011  . SBO (small bowel obstruction) ([place]) 02/15/2011  . ARF (acute renal failure) ([place]) 02/15/2011  . UTI (lower urinary tract infection) 02/15/2011  . Syncope and collapse 02/10/2011  . S/P [employer] Jude BiV ICD upgrade 2015 05/24/2009  . Cardiac conduction disorder 06/01/2008  . Chronic a-fib ([place]) 06/01/2008    Past Surgical History:  Procedure Laterality Date  . BI-VENTRICULAR PACEMAKER INSERTION (CRT-P)  12-02-13   downgrade of previously implanted CRTD to STJ CRTP  . BIV PACEMAKER GENERATOR CHANGE OUT N/A 12/02/2013   Procedure: BIV PACEMAKER GENERATOR CHANGE OUT;  Surgeon: Evans Lance, MD;  Location: [facility] CATH LAB;  Service: Cardiovascular;  Laterality: N/A;  . CATARACT EXTRACTION W/ INTRAOCULAR LENS  IMPLANT, BILATERAL Bilateral   . COLONOSCOPY    . CORONARY ANGIOPLASTY WITH STENT PLACEMENT  07/1997; 08/1997;03/1998  . CORONARY ARTERY BYPASS GRAFT  07/2002   CABG X3  . ESOPHAGOGASTRODUODENOSCOPY  02/19/2011   Procedure: ESOPHAGOGASTRODUODENOSCOPY (EGD);  Surgeon: Jeryl Columbia, MD;  Location[facility] ENDOSCOPY;  Service: Endoscopy;  Laterality: N/A;  . ESOPHAGOGASTRODUODENOSCOPY    . FEMORAL-POPLITEAL BYPASS GRAFT Right 01/20/2016   Procedure: THROMBECTOMY OF POPLITEAL ARTERY AND ANTERIOR TIBIAL ARTERY RIGHT LEG; INTRAOPERATIVE ARTERIOGRAM;  Surgeon: Waynetta Sandy, MD;  Location: [facility];  Service: Vascular;  Laterality: Right;  . INCISION AND DRAINAGE OF WOUND  08/2002   right thigh; S/P EVH  . INSERT / REPLACE / [place]; 1992; 01/2000;  . INSERT / REPLACE / REMOVE PACEMAKER  09/2003; 06/2006   w/AICD  . INSERT / REPLACE / REMOVE PACEMAKER  12/2004   pacmaker explant  . SHOULDER  ARTHROSCOPY W/ ROTATOR CUFF REPAIR  05/2008   left       Home Medications    Prior to Admission medications   Medication Sig Start Date End Date Taking? Authorizing Provider  albuterol (PROVENTIL HFA;VENTOLIN HFA) 108 (90 Base) MCG/ACT inhaler Inhale 2 puffs into the lungs every 4 (four) hours as needed for wheezing or shortness of breath. 06/10/15  Yes Joy, Shawn C, PA-C  albuterol (PROVENTIL) (2.5 MG/3ML) 0.083% nebulizer solution Take 2.5 mg by nebulization every 6 (six) hours as needed for wheezing or shortness of breath.   Yes [provider]  allopurinol (ZYLOPRIM) 300 MG tablet Take 300 mg by mouth daily.     Yes [provider]  CALCIUM-VITAMIN D PO Take 1 tablet by mouth 2 (two) times daily.    Yes [provider]  ELIQUIS 5 MG TABS tablet Take 5 mg by mouth at bedtime.  04/06/16  Yes [provider]  fexofenadine (ALLEGRA) 180  MG tablet Take 180 mg by mouth daily.   Yes [provider]  isosorbide mononitrate (IMDUR) 30 MG 24 hr tablet Take 15 mg by mouth daily.  04/04/16  Yes [provider]  nitroGLYCERIN (NITROSTAT) 0.4 MG SL tablet Place 0.4 mg under the tongue every 5 (five) minutes as needed for chest pain.   Yes [provider]  Omega-3 Fatty Acids (FISH OIL) 1000 MG CAPS Take 1,000 mg by mouth at bedtime.    Yes [provider]  omeprazole (PRILOSEC) 40 MG capsule Take 1 capsule (40 mg total) by mouth 2 (two) times daily. Patient taking differently: Take 20 mg by mouth 2 (two) times daily.  01/20/16  Yes Tat, Shanon Brow, MD  OXYGEN Inhale 2.5 L into the lungs continuous.   Yes [provider]  potassium chloride SA (K-DUR,KLOR-CON) 20 MEQ tablet Take 20 mEq by mouth daily. Take 1 additional tablet (57mEq) if needed for extra fluid. 10/08/16  Yes [provider]  tamsulosin (FLOMAX) 0.4 MG CAPS capsule Take 0.4 mg by mouth at bedtime. 11/01/16  Yes [provider]  torsemide (DEMADEX) 20  MG tablet Take 1 tablet (20 mg total) by mouth 2 (two) times daily. 08/13/16  Yes Dhungel, Nishant, MD  vitamin B-12 (CYANOCOBALAMIN) 1000 MCG tablet Take 1,000 mcg by mouth 2 (two) times daily.   Yes [provider]  feeding supplement, ENSURE ENLIVE, (ENSURE ENLIVE) LIQD Take 237 mLs by mouth 2 (two) times daily between meals. Patient not taking: Reported on 11/07/2016 07/26/16   Geradine Girt, DO  hydrocortisone cream 1 % Apply 1 application topically 2 (two) times daily as needed for itching. Patient not taking: Reported on 09/04/2016 07/26/16   Geradine Girt, DO  ondansetron (ZOFRAN ODT) 4 MG disintegrating tablet Take 1 tablet (4 mg total) by mouth every 8 (eight) hours as needed for nausea or vomiting. Patient not taking: Reported on 09/04/2016 07/07/16   Everlene Balls, MD  potassium chloride SA (K-DUR,KLOR-CON) 10 MEQ tablet Take 1 tablet (10 mEq total) by mouth daily. Patient not taking: Reported on 11/07/2016 04/25/16   Robbie Lis, MD    Family History Family History  Problem Relation Age of Onset  . Alzheimer's disease Father 57  . Cancer Father 108       metastatic prostate cancer  . Diabetes Sister 74  . Diabetes Brother   . Diabetes Brother   . Diabetes Brother   . Hypotension Neg Hx   . Malignant hyperthermia Neg Hx   . Pseudochol deficiency Neg Hx     Social History Social History  Substance Use Topics  . Smoking status: Former Smoker    Packs/day: 0.25    Types: Cigarettes    Quit date: 03/12/1949  . Smokeless tobacco: Current User    Types: Chew  . Alcohol use No     Comment: drank until per pt 03/19/11, h/o heavy use     Allergies   Digitek [digoxin]; Aspirin; and Penicillins   Review of Systems Review of Systems  Constitutional: Negative for chills and fever.  HENT: Negative for rhinorrhea and sore throat.   Eyes: Negative for photophobia and visual disturbance.  Respiratory: Negative for cough, shortness of breath and wheezing.     Cardiovascular: Negative for chest pain, palpitations and leg swelling.  Gastrointestinal: Negative for abdominal pain, nausea and vomiting.  Genitourinary: Negative for difficulty urinating and dysuria.  Musculoskeletal: Positive for arthralgias.       Fell on left hip  and elbow  Skin: Positive for wound. Negative for color change and rash.  Neurological: Positive for syncope. Negative for facial asymmetry, speech difficulty, weakness and numbness.     Physical Exam Updated Vital Signs BP 115/70   Pulse 66   Temp 98.7 F (37.1 C) (Oral)   Resp 16   SpO2 100%   Physical Exam  Constitutional: He appears well-developed and well-nourished. No distress.  HENT:  Head: Normocephalic and atraumatic.  Eyes: Pupils are equal, round, and reactive to light. EOM are normal. Right eye exhibits no discharge. Left eye exhibits no discharge.  Neck: Neck supple. No tracheal deviation present.  Cardiovascular: Normal rate, normal heart sounds and intact distal pulses.   Pulmonary/Chest: Effort normal and breath sounds normal. No respiratory distress. He has no wheezes. He has no rales.  Abdominal: Soft. Bowel sounds are normal. He exhibits no distension and no mass. There is no tenderness. There is no guarding.  Musculoskeletal: He exhibits no edema.  1.5 cm  Abrasion to left elbow  Tender to palpation on left hip, no ecchymosis noted  No edema noted to bilateral lower extremities  Neurological: He is alert. Coordination normal.  Speech is clear, able to follow commands CN III-XII intact Normal strength in upper and lower extremities bilaterally including dorsiflexion and plantar flexion, strong and equal grip strength Sensation normal to light and sharp touch Moves extremities without ataxia, coordination intact    Skin: Skin is warm and dry. He is not diaphoretic.  Psychiatric: He has a normal mood and affect. His behavior is normal.  Nursing note and vitals reviewed.    ED  Treatments / Results  Labs (all labs ordered are listed, but only abnormal results are displayed) Labs Reviewed  BASIC METABOLIC PANEL - Abnormal; Notable for the following:       Result Value   Sodium 131 (*)    Chloride 94 (*)    Glucose, Bld 134 (*)    BUN 23 (*)    Creatinine, Ser 1.38 (*)    GFR calc non Af Amer 46 (*)    GFR calc Af Amer 53 (*)    All other components within normal limits  CBC - Abnormal; Notable for the following:    WBC 2.7 (*)    RBC 2.48 (*)    Hemoglobin 8.4 (*)    HCT 26.0 (*)    MCV 104.8 (*)    RDW 18.5 (*)    Platelets 135 (*)    All other components within normal limits  URINALYSIS, ROUTINE W REFLEX MICROSCOPIC - Abnormal; Notable for the following:    Hgb urine dipstick MODERATE (*)    All other components within normal limits  BRAIN NATRIURETIC PEPTIDE - Abnormal; Notable for the following:    B Natriuretic Peptide 1,619.3 (*)    All other components within normal limits  TROPONIN I  BASIC METABOLIC PANEL  CBC  CBG MONITORING, ED    EKG  EKG Interpretation  Date/Time:  Wednesday November 07 2016 14:49:36 EDT Ventricular Rate:  63 PR Interval:    QRS Duration: 175 QT Interval:  477 QTC Calculation: 489 R Axis:   158 Text Interpretation:  ventricular-paced complexes Premature ventricular complexes Abnormal ekg Confirmed by Carmin Muskrat [phone]) on 11/07/2016 3:27:26 PM       Radiology Dg Chest 2 View  Result Date: 11/07/2016 CLINICAL DATA:  Syncope. EXAM: CHEST  2 VIEW COMPARISON:  08/17/2016 FINDINGS: Stable postsurgical changes from CABG. Stable positioning of pacemaker leads.  Cardiomediastinal silhouette is moderately enlarged. Mediastinal contours appear intact. Calcific atherosclerotic disease and tortuosity of the aorta. There is no evidence of focal airspace consolidation, pleural effusion or pneumothorax. Mild pulmonary vascular congestion. Osseous structures are without acute abnormality. Soft tissues are grossly normal.  IMPRESSION: Stably enlarged cardiac silhouette with mild pulmonary vascular congestion. No evidence of overt pulmonary edema or focal airspace consolidation. Electronically Signed   By: Fidela Salisbury M.D.   On: 11/07/2016 16:53   Dg Elbow Complete Left  Result Date: 11/07/2016 CLINICAL DATA:  Syncope today with abrasion of the left elbow after fall. EXAM: LEFT ELBOW - COMPLETE 3+ VIEW COMPARISON:  None. FINDINGS: There is no evidence of acute fracture, dislocation, or joint effusion. Small enthesophyte at the triceps insertion. Mild soft tissue swelling is noted posteriorly. There is no evidence of arthropathy or other focal bone abnormality. Soft IMPRESSION: No acute fracture or dislocation of the left elbow. Mild dorsal soft tissue swelling with tiny olecranon spur. Electronically Signed   By: Ashley Royalty M.D.   On: 11/07/2016 16:53   Ct Head Wo Contrast  Result Date: 11/07/2016 CLINICAL DATA:  Generalize weakness for 2 weeks, syncopal episode EXAM: CT HEAD WITHOUT CONTRAST TECHNIQUE: Contiguous axial images were obtained from the base of the skull through the vertex without intravenous contrast. COMPARISON:  01/20/2016, 06/04/2013 FINDINGS: Brain: No acute territorial infarction, hemorrhage, or intracranial mass is seen. Small old infarct in the right cerebellum. Atrophy and mild small vessel ischemic changes of the white matter. Stable ventricle size. Vascular: No hyperdense vessels.  Carotid artery calcifications. Skull: No fracture or suspicious bone lesion. Sinuses/Orbits: Mild mucosal thickening in the ethmoid sinuses. No acute orbital abnormality. Bilateral lens extraction. Other: Stable coarse calcification in the left parotid space. IMPRESSION: 1. No CT evidence for acute intracranial abnormality. 2. Atrophy and mild small vessel ischemic changes of the white matter Electronically Signed   By: Donavan Foil M.D.   On: 11/07/2016 18:10   Dg Hip Unilat W Or Wo Pelvis 2-3 Views Left  Result  Date: 11/07/2016 CLINICAL DATA:  Syncopal episode this morning. Left buttock an upper left femoral pain. EXAM: DG HIP (WITH OR WITHOUT PELVIS) 2-3V LEFT COMPARISON:  None. FINDINGS: Eccentric disc space narrowing is noted at L4-5 with disc space narrowing on the right. The bony pelvis appears intact. No pelvic diastasis. Three surgical clips project over the pubic symphysis. Hip joints are maintained bilaterally. No acute fracture nor dislocations. Soft tissue ossifications are present adjacent to the greater trochanter which may reflect stigmata of gluteal tendinopathy. Extensive vascular calcifications seen bilaterally along the course the internal and external iliacs through femoral and branch vessel arteries. IMPRESSION: No acute osseous abnormality of the bony pelvis and left hip. Electronically Signed   By: Ashley Royalty M.D.   On: 11/07/2016 16:55    Procedures Procedures (including critical care time)  Medications Ordered in ED Medications  fentaNYL (SUBLIMAZE) injection 12.5 mcg (12.5 mcg Intravenous Given 11/07/16 1701)     Initial Impression / Assessment and Plan / ED Course  I have reviewed the triage vital signs and the nursing notes.  Pertinent labs & imaging results that were available during my care of the patient were reviewed by me and considered in my medical decision making (see chart for details).  Patient presents after a syncopal episode this morning, vitals are stable, currently denies chest pain or shortness of breath. Will order CT of head, left elbow x-rays, and left hip x-rays to assess trauma  from fall. CBC and BMP are unremarkable, hemoglobin is 8.4 but patient has a history of anemia with similar past hemoglobins denies dark or bloody stools, kidney function is at baseline. Troponin BNP and chest x-ray ordered to assess for causes of syncopal episode. 12.5 of fentanyl provided for pain.  X-rays of left help and elbow show no evidence of acute fracture. Head CT shows no  acute intracranial hemorrhage. Troponin is negative and BNP is improved from 3 months ago. Patient does have moderate hematuria on UA, this appears to have been present for many months.  Pacemaker has been interrogated, reported everything looked good.  Syncope likely due to orthostatic hypotension, see orthostatic vitals below. Patient will need to be admitted for further workup of his syncope and hypotension.  Patient discussed with Dr. Vanita Panda, who saw patient as well and agrees with plan.   Orthostatic VS for the past 24 hrs:  BP- Lying Pulse- Lying BP- Sitting Pulse- Sitting BP- Standing at 0 minutes Pulse- Standing at 0 minutes  11/07/16 1825 108/67 71 101/57 73 (!) 79/42 63    Final Clinical Impressions(s) / ED Diagnoses   Final diagnoses:  Orthostatic hypotension  Syncope, unspecified syncope type    New Prescriptions New Prescriptions   No medications on file     Janet Berlin 11/07/16 2354    Carmin Muskrat, MD 11/13/16 [phone]

## 2016-11-08 ENCOUNTER — Observation Stay (HOSPITAL_BASED_OUTPATIENT_CLINIC_OR_DEPARTMENT_OTHER): Payer: Medicare Other

## 2016-11-08 DIAGNOSIS — R55 Syncope and collapse: Secondary | ICD-10-CM | POA: Diagnosis not present

## 2016-11-08 DIAGNOSIS — I951 Orthostatic hypotension: Secondary | ICD-10-CM | POA: Diagnosis not present

## 2016-11-08 DIAGNOSIS — I255 Ischemic cardiomyopathy: Secondary | ICD-10-CM | POA: Diagnosis not present

## 2016-11-08 LAB — CBC
HCT: 26.1 % — ABNORMAL LOW (ref 39.0–52.0)
Hemoglobin: 8.3 g/dL — ABNORMAL LOW (ref 13.0–17.0)
MCH: 32.9 pg (ref 26.0–34.0)
MCHC: 31.8 g/dL (ref 30.0–36.0)
MCV: 103.6 fL — AB (ref 78.0–100.0)
PLATELETS: 156 10*3/uL (ref 150–400)
RBC: 2.52 MIL/uL — ABNORMAL LOW (ref 4.22–5.81)
RDW: 18.4 % — AB (ref 11.5–15.5)
WBC: 2.5 10*3/uL — AB (ref 4.0–10.5)

## 2016-11-08 LAB — BASIC METABOLIC PANEL
Anion gap: 8 (ref 5–15)
BUN: 23 mg/dL — AB (ref 6–20)
CHLORIDE: 94 mmol/L — AB (ref 101–111)
CO2: 29 mmol/L (ref 22–32)
CREATININE: 1.18 mg/dL (ref 0.61–1.24)
Calcium: 8.8 mg/dL — ABNORMAL LOW (ref 8.9–10.3)
GFR calc Af Amer: >60 mL/min (ref >60)
GFR calc non Af Amer: 56 mL/min — ABNORMAL LOW (ref >60)
Glucose, Bld: 81 mg/dL (ref 65–99)
Potassium: 3.8 mmol/L (ref 3.5–5.1)
SODIUM: 131 mmol/L — AB (ref 135–145)

## 2016-11-08 LAB — ECHOCARDIOGRAM COMPLETE
Height: 66 in
WEIGHTICAEL: 2049.4 [oz_av]

## 2016-11-08 LAB — GLUCOSE, CAPILLARY: GLUCOSE-CAPILLARY: 104 mg/dL — AB (ref 65–99)

## 2016-11-08 MED ORDER — ISOSORBIDE DINITRATE 5 MG PO TABS
2.5000 mg | ORAL_TABLET | Freq: Three times a day (TID) | ORAL | Status: DC
  Administered 2016-11-08 – 2016-11-10 (×6): 2.5 mg via ORAL
  Filled 2016-11-08 (×10): qty 1

## 2016-11-08 MED ORDER — APIXABAN 2.5 MG PO TABS
2.5000 mg | ORAL_TABLET | Freq: Two times a day (BID) | ORAL | Status: DC
  Administered 2016-11-08 – 2016-11-10 (×5): 2.5 mg via ORAL
  Filled 2016-11-08 (×5): qty 1

## 2016-11-08 MED ORDER — PERFLUTREN LIPID MICROSPHERE
INTRAVENOUS | Status: AC
  Filled 2016-11-08: qty 10

## 2016-11-08 MED ORDER — PERFLUTREN LIPID MICROSPHERE
1.0000 mL | INTRAVENOUS | Status: AC | PRN
  Administered 2016-11-08: 2 mL via INTRAVENOUS
  Filled 2016-11-08: qty 10

## 2016-11-08 NOTE — Evaluation (Signed)
Physical Therapy Evaluation Patient Details Name: [name] MRN: [id number] DOB: 10/24/1932 Today's Date: 11/08/2016   History of Present Illness  Pt is an 83 y.o. male with medical history significant for coronary artery disease status post CABG, ischemic cardiomyopathy with EF 10-15%, chronic atrial fibrillation on Eliquis, COPD, CVA, DM and chronic kidney disease stage III.  He presented to the emergency department after a syncopal episode at home.    Clinical Impression  Pt admitted with above diagnosis. Pt currently with functional limitations due to the deficits listed below (see PT Problem List). On eval, supervision provided for bed mobility and min guard assist transfers. Unable to progress ambulation due to dizziness. Pt presenting with orthostatic hypotension. See vital signs for values. Pt agreeable to sitting up in recliner. Attempted to instruct pt in ankle pumps to assist with circulation. Pt reports his feet are 'frozen' and he just can't do that.  Pt will benefit from skilled PT to increase their independence and safety with mobility to allow discharge to the venue listed below.  Pt reports being active with HHPT at time of admission.      Follow Up Recommendations Home health PT;Supervision for mobility/OOB    Equipment Recommendations  None recommended by PT    Recommendations for Other Services       Precautions / Restrictions Precautions Precautions: Fall;Other (comment) Precaution Comments: watch BP      Mobility  Bed Mobility Overal bed mobility: Needs Assistance Bed Mobility: Supine to Sit     Supine to sit: HOB elevated;Supervision     General bed mobility comments: +rail, increased time to complete, supervision for safety  Transfers Overall transfer level: Needs assistance Equipment used: None Transfers: Sit to/from [employer] Transfers Sit to Stand: Min guard Stand pivot transfers: Min guard       General transfer comment: min guard  for safety due to orthostatic  Ambulation/Gait             General Gait Details: unable to progress ambulation due to dizziness  Stairs            Wheelchair Mobility    Modified Rankin (Stroke Patients Only)       Balance                                             Pertinent Vitals/Pain Pain Assessment: No/denies pain    Home Living Family/patient expects to be discharged to:: Private residence Living Arrangements: Children Available Help at Discharge: Family;Available PRN/intermittently Type of Home: House Home Access: Level entry     Home Layout: One level Home Equipment: Walker - 2 wheels;Walker - 4 wheels;Bedside commode;Shower seat;Hospital bed Additional Comments: Pt uses O2 at home PRN.    Prior Function Level of Independence: Needs assistance   Gait / Transfers Assistance Needed: Uses RW for mobility outside the home. Furniture walks in the house.  ADL's / Homemaking Assistance Needed: Daughters assist with bathing (doesn't use shower, does dponge baths only). Dresses himself.  Comments: Has cats and dogs at home.     Hand Dominance   Dominant Hand: Right    Extremity/Trunk Assessment                Communication   Communication: HOH  Cognition   Behavior During Therapy: WFL for tasks assessed/performed Overall Cognitive Status: Within Functional Limits for tasks assessed  General Comments      Exercises     Assessment/Plan    PT Assessment Patient needs continued PT services  PT Problem List Decreased strength;Decreased mobility;Decreased activity tolerance;Decreased balance;Cardiopulmonary status limiting activity       PT Treatment Interventions Gait training;Functional mobility training;Balance training;Therapeutic exercise;Therapeutic activities;Patient/family education    PT Goals (Current goals can be found in the Care Plan section)  Acute  Rehab PT Goals Patient Stated Goal: home PT Goal Formulation: With patient Time For Goal Achievement: 11/22/16 Potential to Achieve Goals: Good    Frequency Min 3X/week   Barriers to discharge        Co-evaluation               AM-PAC PT "6 Clicks" Daily Activity  Outcome Measure Difficulty turning over in bed (including adjusting bedclothes, sheets and blankets)?: None Difficulty moving from lying on back to sitting on the side of the bed? : A Little Difficulty sitting down on and standing up from a chair with arms (e.g., wheelchair, bedside commode, etc,.)?: A Little Help needed moving to and from a bed to chair (including a wheelchair)?: A Little Help needed walking in hospital room?: A Little Help needed climbing 3-5 steps with a railing? : A Little 6 Click Score: 19    End of Session Equipment Utilized During Treatment: Gait belt;Oxygen Activity Tolerance: Patient tolerated treatment well Patient left: in chair;with chair alarm set;with call bell/phone within reach Nurse Communication: Mobility status PT Visit Diagnosis: History of falling (Z91.81);Difficulty in walking, not elsewhere classified (R26.2)    Time: 1022-1040 PT Time Calculation (min) (ACUTE ONLY): 18 min   Charges:   PT Evaluation $PT Eval Moderate Complexity: 1 Mod     PT G Codes:   PT G-Codes **NOT FOR INPATIENT CLASS** Functional Assessment Tool Used: AM-PAC 6 Clicks Basic Mobility Functional Limitation: Mobility: Walking and moving around Mobility: Walking and Moving Around Current Status (Y5859): At least 20 percent but less than 40 percent impaired, limited or restricted Mobility: Walking and Moving Around Goal Status [phone]): At least 1 percent but less than 20 percent impaired, limited or restricted    Lorrin Goodell, PT  Office # [phone] Pager [phone]   Lorriane Shire 11/08/2016, 10:53 AM

## 2016-11-08 NOTE — Progress Notes (Signed)
PROGRESS NOTE    [name]  BJS:[id number] DOB: 1932/11/08 DOA: 11/07/2016 PCP: [name], MD   Specialists:   Cardiology Dr. Percival Spanish Pulmonology Dr. Shyrl Numbers  Brief Narrative:   83 year old male Ischemic cardiomyopathy EF 10-15% + PPM St. Jude CAD prior CABG 2004 Sick sinus syndrome CHad2Vasc2 score= 5, on Elliquis Prior CVA Prostate cancer recent score 9 2012 Chronic respiratory failure on home O2 2.5 L secondary to COPD? BLack lung disease? Type 2 diabetes mellitus Chronic kidney disease stage III Previously on home hospice from February 2018 up until the middle of 2018--- this was requested his cardiologist   Admitted through the emergency room 8/30 Became severely lightheaded at home had an assisted fall to the ground EMS transported the patient  Appears that his outpatient physicians have in trying to adjust his medications because of generalized weak( Noted 29 mmHg drop in systolic pressure with positional change Noncontrast CT head negative for abnormality Chronic pancytopenia on labs Pacemaker interrogated and found to be normal  Assessment & Plan:   Principal Problem:   Syncope and collapse Active Problems:   Chronic a-fib ([place])   S/P St Jude BiV ICD upgrade 6073   Systolic CHF, chronic (HCC)   Weakness generalized   Syncope   Chronic respiratory failure with hypoxia ([place])   Chronic renal disease, stage III   Cardiomyopathy, ischemic   DM2 (diabetes mellitus, type 2) (HCC)   Macrocytic anemia   Orthostatic hypotension   Hyponatremia   COPD GOLD 0    Pancytopenia (HCC)   Syncope most likely from orthostasis secondary to diuretics  Torsemide 20 mg twice a day on hold  Holding IV fluids  We'll consider decreasing dose of Imdur 15 daily--have changed the patient to 2.5 mg of Isordil 3 times a day to see if this makes a difference in his syncope  Ischemic cardiomyopathy-severe-EF about 71% Chronic systolic heart failure Sick sinus syndrome with St.  Jude pacemaker-last generator upgrade 2015  Echo to be updated as per admitting physician-last one was 01/18/2016--showed mild aortic stenosis which can probably cause #1 4 at least contribute  bnp 1619 on admission  Point-of-care troponin is negative and patient not having chest pain  Chronic kidney disease stage II to 3 Hypovolemic Hyponatremia   Diuretics on hold accounting for mild decrease in sodium  May need to reimplement cautiously  Black lung disease Chronic respiratory failure on home oxygen 2 L COPD Gold stage 0  Continue oxygen for now-continue albuterol every 6 when necessary wheeze  Prostate cancer Gleason score 9  History of radioactive seeds  Continue Flomax 0.4 at bedtime  Gout continue allopurinol 300 daily   Global--- patient has had 5 admissions so far this year. He recently decided he wished to be off of hospice according to his daughter they stopped hospice as he was having fluid buildup and they're not wishing hospice to follow because they want symptomatic management I do not think they clearly understand how sick he is and I will attempt to reengage again more    DVT prophylaxis: lovenox Code Status: DNR Family Communication:  Talked with daughter 8/30 Disposition Plan:  unclear   Consultants:    none  Procedures:   none  Antimicrobials:   none    Subjective: Ate only 20 % meals Feels ok but dizzy ion stabding No n/v HOH No cp  Objective: Vitals:   11/07/16 1815 11/07/16 1945 11/07/16 2012 11/08/16 0434  BP: 107/61 (!) 115/56 (!) 110/51 (!) 95/55  Pulse: 66 65 67 66  Resp: 19 19 19 16   Temp:   98.6 F (37 C) 97.9 F (36.6 C)  TempSrc:   Oral Oral  SpO2: 100% 100% 100% 99%    Intake/Output Summary (Last 24 hours) at 11/08/16 0731 Last data filed at 11/08/16 0434  Gross per 24 hour  Intake                0 ml  Output              625 ml  Net             -625 ml   There were no vitals filed for this  visit.  Examination:  ncat eomi s1 s 2 HSM Some Le edema abd soft nt nd no rebound hoh intact no unilat weak  Data Reviewed: I have personally reviewed following labs and imaging studies  CBC:  Recent Labs Lab 11/07/16 1440 11/08/16 0352  WBC 2.7* 2.5*  HGB 8.4* 8.3*  HCT 26.0* 26.1*  MCV 104.8* 103.6*  PLT 135* 102   Basic Metabolic Panel:  Recent Labs Lab 11/07/16 1440 11/08/16 0352  NA 131* 131*  K 4.3 3.8  CL 94* 94*  CO2 27 29  GLUCOSE 134* 81  BUN 23* 23*  CREATININE 1.38* 1.18  CALCIUM 9.0 8.8*   GFR: CrCl cannot be calculated (Unknown ideal weight.). Liver Function Tests: No results for input(s): AST, ALT, ALKPHOS, BILITOT, PROT, ALBUMIN in the last 168 hours. No results for input(s): LIPASE, AMYLASE in the last 168 hours. No results for input(s): AMMONIA in the last 168 hours. Coagulation Profile: No results for input(s): INR, PROTIME in the last 168 hours. Cardiac Enzymes:  Recent Labs Lab 11/07/16 1607  TROPONINI <0.03   BNP (last 3 results) No results for input(s): PROBNP in the last 8760 hours. HbA1C: No results for input(s): HGBA1C in the last 72 hours. CBG:  Recent Labs Lab 11/07/16 1607  GLUCAP 95   Lipid Profile: No results for input(s): CHOL, HDL, LDLCALC, TRIG, CHOLHDL, LDLDIRECT in the last 72 hours. Thyroid Function Tests: No results for input(s): TSH, T4TOTAL, FREET4, T3FREE, THYROIDAB in the last 72 hours. Anemia Panel: No results for input(s): VITAMINB12, FOLATE, FERRITIN, TIBC, IRON, RETICCTPCT in the last 72 hours. Urine analysis:    Component Value Date/Time   COLORURINE YELLOW 11/07/2016 1700   APPEARANCEUR CLEAR 11/07/2016 1700   LABSPEC 1.006 11/07/2016 1700   LABSPEC 1.015 05/03/2011 0950   PHURINE 5.0 11/07/2016 1700   GLUCOSEU NEGATIVE 11/07/2016 1700   HGBUR MODERATE (A) 11/07/2016 1700   BILIRUBINUR NEGATIVE 11/07/2016 1700   BILIRUBINUR Color Interference 05/03/2011 0950   KETONESUR NEGATIVE  11/07/2016 1700   PROTEINUR NEGATIVE 11/07/2016 1700   UROBILINOGEN 0.2 11/10/2014 1815   NITRITE NEGATIVE 11/07/2016 1700   LEUKOCYTESUR NEGATIVE 11/07/2016 1700   LEUKOCYTESUR Color Interference 05/03/2011 0950     Radiology Studies: Reviewed images personally in health database    Scheduled Meds: . allopurinol  300 mg Oral Daily  . apixaban  5 mg Oral QHS  . calcium-vitamin D  1 tablet Oral BID  . isosorbide mononitrate  15 mg Oral Daily  . loratadine  10 mg Oral Daily  . omega-3 acid ethyl esters  1 g Oral Daily  . pantoprazole  40 mg Oral Daily  . sodium chloride flush  3 mL Intravenous Q12H  . sodium chloride flush  3 mL Intravenous Q12H  . tamsulosin  0.4 mg Oral QHS  .  vitamin B-12  1,000 mcg Oral BID   Continuous Infusions: . sodium chloride       LOS: 0 days    Time spent: [place], MD [facility] ([facility]   If 7PM-7AM, please contact night-coverage [email or web address] Password [email or web address] 11/08/2016, 7:31 AM

## 2016-11-08 NOTE — Progress Notes (Signed)
[facility]  Patient Status: Active (receiving services up to time of hospitalization)  [facility] is providing the following services: RN  If patient discharges after hours, please call [phone].   [name] 11/08/2016, 11:14 AM

## 2016-11-08 NOTE — Care Management Note (Signed)
Case Management Note  Patient Details  Name: [name] MRN: [id number] Date of Birth: Dec 26, 1932  Subjective/Objective:    CM following for progression and d/c planning. Pt with HHRN at this time from [facility] [place] notified of pt admission . Per pt daughter pt has a Marine scientist from [employer] that his insurance sends out yearly to eval the pt. Pt is currently on continuous oxygen at home which he gets from [facility].                 Action/Plan: Pt daughter wants to be sure that the pt has the same [facility], [place] as the pt works very well with [employer]. [place] notified.   Expected Discharge Date:                  Expected Discharge Plan:  [place]  In-House Referral:  NA  Discharge planning Services  CM Consult  Post Acute Care Choice:  Home Health Choice offered to:  Patient  DME Arranged:    DME Agency:     HH Arranged:  RN [place] Agency:  [place]  Status of Service:  In process, will continue to follow  If discussed at Long Length of Stay Meetings, dates discussed:    Additional Comments:  Adron Bene, RN 11/08/2016, 11:13 AM

## 2016-11-08 NOTE — Care Management Obs Status (Signed)
MEDICARE OBSERVATION STATUS NOTIFICATION   Patient Details  Name: [name] MRN: [id number] Date of Birth: 1932/08/17   Medicare Observation Status Notification Given:  Yes  Pt with difficulty understanding status, pt daughter , Shelbie Ammons by this CM with pt permission as he states that she "handles these things". Mrs Roselie Awkward verbalized understanding of Obs status.   Avrohom Mckelvin, Rory Percy, RN 11/08/2016, 11:08 AM

## 2016-11-08 NOTE — Progress Notes (Signed)
  Echocardiogram 2D Echocardiogram has been performed.  [name] 11/08/2016, 3:35 PM

## 2016-11-09 DIAGNOSIS — I951 Orthostatic hypotension: Secondary | ICD-10-CM | POA: Diagnosis not present

## 2016-11-09 DIAGNOSIS — R55 Syncope and collapse: Secondary | ICD-10-CM | POA: Diagnosis not present

## 2016-11-09 LAB — CBC WITH DIFFERENTIAL/PLATELET
Basophils Absolute: 0 10*3/uL (ref 0.0–0.1)
Basophils Relative: 0 %
EOS ABS: 0 10*3/uL (ref 0.0–0.7)
EOS PCT: 0 %
HCT: 25.5 % — ABNORMAL LOW (ref 39.0–52.0)
Hemoglobin: 8.2 g/dL — ABNORMAL LOW (ref 13.0–17.0)
LYMPHS ABS: 1.2 10*3/uL (ref 0.7–4.0)
Lymphocytes Relative: 35 %
MCH: 33.5 pg (ref 26.0–34.0)
MCHC: 32.2 g/dL (ref 30.0–36.0)
MCV: 104.1 fL — ABNORMAL HIGH (ref 78.0–100.0)
Monocytes Absolute: 0.5 10*3/uL (ref 0.1–1.0)
Monocytes Relative: 15 %
Neutro Abs: 1.7 10*3/uL (ref 1.7–7.7)
Neutrophils Relative %: 50 %
PLATELETS: 134 10*3/uL — AB (ref 150–400)
RBC: 2.45 MIL/uL — ABNORMAL LOW (ref 4.22–5.81)
RDW: 18.2 % — ABNORMAL HIGH (ref 11.5–15.5)
WBC: 3.3 10*3/uL — AB (ref 4.0–10.5)

## 2016-11-09 LAB — RENAL FUNCTION PANEL
Albumin: 2.5 g/dL — ABNORMAL LOW (ref 3.5–5.0)
Anion gap: 8 (ref 5–15)
BUN: 21 mg/dL — ABNORMAL HIGH (ref 6–20)
CALCIUM: 8.6 mg/dL — AB (ref 8.9–10.3)
CO2: 28 mmol/L (ref 22–32)
CREATININE: 1.14 mg/dL (ref 0.61–1.24)
Chloride: 95 mmol/L — ABNORMAL LOW (ref 101–111)
GFR calc Af Amer: >60 mL/min (ref >60)
GFR calc non Af Amer: 58 mL/min — ABNORMAL LOW (ref >60)
GLUCOSE: 87 mg/dL (ref 65–99)
Phosphorus: 3.9 mg/dL (ref 2.5–4.6)
Potassium: 3.9 mmol/L (ref 3.5–5.1)
SODIUM: 131 mmol/L — AB (ref 135–145)

## 2016-11-09 LAB — GLUCOSE, CAPILLARY: Glucose-Capillary: 102 mg/dL — ABNORMAL HIGH (ref 65–99)

## 2016-11-09 MED ORDER — MIDODRINE HCL 5 MG PO TABS
10.0000 mg | ORAL_TABLET | Freq: Three times a day (TID) | ORAL | Status: DC
  Administered 2016-11-10: 10 mg via ORAL
  Filled 2016-11-09: qty 2

## 2016-11-09 MED ORDER — MIDODRINE HCL 5 MG PO TABS
5.0000 mg | ORAL_TABLET | Freq: Three times a day (TID) | ORAL | Status: DC
  Administered 2016-11-09 (×2): 5 mg via ORAL
  Filled 2016-11-09 (×2): qty 1

## 2016-11-09 MED ORDER — FUROSEMIDE 20 MG PO TABS
20.0000 mg | ORAL_TABLET | ORAL | Status: DC
  Administered 2016-11-09: 20 mg via ORAL
  Filled 2016-11-09: qty 1

## 2016-11-09 NOTE — Progress Notes (Signed)
PROGRESS NOTE    [name]  HYW:[id number] DOB: [birth year] DOA: 11/07/2016 PCP: [name], MD   Specialists:   Cardiology Dr. Percival Spanish Pulmonology Dr. Shyrl Numbers  Brief Narrative:   83 year old male Ischemic cardiomyopathy EF 10-15% + PPM St. Jude CAD prior CABG 2004 Sick sinus syndrome CHad2Vasc2 score= 5, on Elliquis Prior CVA Prostate cancer recent score 9 2012 Chronic respiratory failure on home O2 2.5 L secondary to COPD? BLack lung disease? Type 2 diabetes mellitus Chronic kidney disease stage III Previously on home hospice from February 2018 up until the middle of 2018--- this was requested his cardiologist   Admitted through the emergency room 8/30 Became severely lightheaded at home had an assisted fall to the ground EMS transported the patient  Appears that his outpatient physicians have in trying to adjust his medications because of generalized weak( Noted 29 mmHg drop in systolic pressure with positional change Noncontrast CT head negative for abnormality Chronic pancytopenia on labs Pacemaker interrogated and found to be normal  Assessment & Plan:   Principal Problem:   Syncope and collapse Active Problems:   Chronic a-fib ([place])   S/P St Jude BiV ICD upgrade 4854   Systolic CHF, chronic (HCC)   Weakness generalized   Syncope   Chronic respiratory failure with hypoxia ([place])   Chronic renal disease, stage III   Cardiomyopathy, ischemic   DM2 (diabetes mellitus, type 2) (HCC)   Macrocytic anemia   Orthostatic hypotension   Hyponatremia   COPD GOLD 0    Pancytopenia (HCC)   Syncope most likely from orthostasis secondary to diuretics  Torsemide 20 mg twice a day on hold  Holding IV fluids   Imdur 15 daily--have changed the patient to 2.5 mg of Isordil 3 times a day to see if this makes a difference in his syncope  He is still a  Bit dizzy but walked about 50 ft today--orthostatics mildy + --would try TEd hose, added 5 mg tid midodrine-->10 8/30  Will  need HH  Should be stable for d/c in am  Ischemic cardiomyopathy-severe-EF about 62% Chronic systolic heart failure Sick sinus syndrome with St. Jude pacemaker-last generator upgrade 2015  Echo to be updated as per admitting physician-last one was 01/18/2016--showed mild aortic stenosis which can probably cause #1 4 at least contribute  bnp 1619 on admission  Point-of-care troponin is negative and patient not having chest pain  Chronic kidney disease stage II to 3 Hypovolemic Hyponatremia   Diuretics on hold accounting for mild decrease in sodium  May need to reimplement cautiously--starting back lasix 20 qod  Black lung disease Chronic respiratory failure on home oxygen 2 L COPD Gold stage 0  Continue oxygen for now-continue albuterol every 6 when necessary wheeze  Prostate cancer Gleason score 9  History of radioactive seeds  Continue Flomax 0.4 at bedtime  Gout continue allopurinol 300 daily   Global--- patient has had 5 admissions so far this year. He recently decided he wished to be off of hospice according to his daughter they stopped hospice as he was having fluid buildup and they're not wishing hospice to follow because they want symptomatic management   DVT prophylaxis: lovenox Code Status: DNR Family Communication:  Talked with daughter 8/30, 8/31 Disposition Plan:  unclear   Consultants:    none  Procedures:   none  Antimicrobials:   none    Subjective:  Some dizzy Better overall  No unilaty weak No cp eating  Objective: Vitals:   11/08/16 0848 11/08/16  2100 11/09/16 0352 11/09/16 0936  BP:  104/67 (!) 112/48 (!) 102/57  Pulse:  63 66 61  Resp: 17 18 18 17   Temp: 98 F (36.7 C) 97.8 F (36.6 C) (!) 97.5 F (36.4 C) 97.6 F (36.4 C)  TempSrc: Oral Oral Oral Oral  SpO2: 100% 100% 100% 100%  Weight:  54.4 kg (120 lb)    Height:        Intake/Output Summary (Last 24 hours) at 11/09/16 1734 Last data filed at 11/09/16 1422  Gross per  24 hour  Intake              960 ml  Output              875 ml  Net               85 ml   Filed Weights   11/08/16 0804 11/08/16 2100  Weight: 58.1 kg (128 lb 1.4 oz) 54.4 kg (120 lb)    Examination:  ncat eomi s1 s 2 HSM Some Le edema abd soft nt nd no rebound hoh intact no unilat weak No rales no ronchi  Data Reviewed: I have personally reviewed following labs and imaging studies  CBC:  Recent Labs Lab 11/07/16 1440 11/08/16 0352 11/09/16 0326  WBC 2.7* 2.5* 3.3*  NEUTROABS  --   --  1.7  HGB 8.4* 8.3* 8.2*  HCT 26.0* 26.1* 25.5*  MCV 104.8* 103.6* 104.1*  PLT 135* 156 628*   Basic Metabolic Panel:  Recent Labs Lab 11/07/16 1440 11/08/16 0352 11/09/16 0326  NA 131* 131* 131*  K 4.3 3.8 3.9  CL 94* 94* 95*  CO2 27 29 28   GLUCOSE 134* 81 87  BUN 23* 23* 21*  CREATININE 1.38* 1.18 1.14  CALCIUM 9.0 8.8* 8.6*  PHOS  --   --  3.9   GFR: Estimated Creatinine Clearance: 38.4 mL/min (by C-G formula based on SCr of 1.14 mg/dL). Liver Function Tests:  Recent Labs Lab 11/09/16 0326  ALBUMIN 2.5*   No results for input(s): LIPASE, AMYLASE in the last 168 hours. No results for input(s): AMMONIA in the last 168 hours. Coagulation Profile: No results for input(s): INR, PROTIME in the last 168 hours. Cardiac Enzymes:  Recent Labs Lab 11/07/16 1607  TROPONINI <0.03   BNP (last 3 results) No results for input(s): PROBNP in the last 8760 hours. HbA1C: No results for input(s): HGBA1C in the last 72 hours. CBG:  Recent Labs Lab 11/07/16 1607 11/08/16 0751 11/09/16 0757  GLUCAP 95 104* 102*   Lipid Profile: No results for input(s): CHOL, HDL, LDLCALC, TRIG, CHOLHDL, LDLDIRECT in the last 72 hours. Thyroid Function Tests: No results for input(s): TSH, T4TOTAL, FREET4, T3FREE, THYROIDAB in the last 72 hours. Anemia Panel: No results for input(s): VITAMINB12, FOLATE, FERRITIN, TIBC, IRON, RETICCTPCT in the last 72 hours. Urine analysis:      Component Value Date/Time   COLORURINE YELLOW 11/07/2016 1700   APPEARANCEUR CLEAR 11/07/2016 1700   LABSPEC 1.006 11/07/2016 1700   LABSPEC 1.015 05/03/2011 0950   PHURINE 5.0 11/07/2016 1700   GLUCOSEU NEGATIVE 11/07/2016 1700   HGBUR MODERATE (A) 11/07/2016 1700   BILIRUBINUR NEGATIVE 11/07/2016 1700   BILIRUBINUR Color Interference 05/03/2011 0950   KETONESUR NEGATIVE 11/07/2016 1700   PROTEINUR NEGATIVE 11/07/2016 1700   UROBILINOGEN 0.2 11/10/2014 1815   NITRITE NEGATIVE 11/07/2016 1700   LEUKOCYTESUR NEGATIVE 11/07/2016 1700   LEUKOCYTESUR Color Interference 05/03/2011 0950     Radiology Studies:  Reviewed images personally in health database    Scheduled Meds: . allopurinol  300 mg Oral Daily  . apixaban  2.5 mg Oral BID  . calcium-vitamin D  1 tablet Oral BID  . isosorbide dinitrate  2.5 mg Oral TID  . loratadine  10 mg Oral Daily  . midodrine  5 mg Oral TID WC  . omega-3 acid ethyl esters  1 g Oral Daily  . pantoprazole  40 mg Oral Daily  . sodium chloride flush  3 mL Intravenous Q12H  . sodium chloride flush  3 mL Intravenous Q12H  . tamsulosin  0.4 mg Oral QHS  . vitamin B-12  1,000 mcg Oral BID   Continuous Infusions: . sodium chloride       LOS: 0 days    Time spent: [place], MD [facility] ([facility]   If 7PM-7AM, please contact night-coverage [email or web address] Password [facility] 11/09/2016, 5:34 PM

## 2016-11-09 NOTE — Discharge Instructions (Signed)

## 2016-11-09 NOTE — Progress Notes (Signed)
Pharmacist Heart Failure Core Measure Documentation  Assessment: [name] has an EF documented as 15-20% on  11/08/2016 by echo.  Rationale: Heart failure patients with left ventricular systolic dysfunction (LVSD) and an EF < 40% should be prescribed an angiotensin converting enzyme inhibitor (ACEI) or angiotensin receptor blocker (ARB) at discharge unless a contraindication is documented in the medical record.  This patient is not currently on an ACEI or ARB for HF.  This note is being placed in the record in order to provide documentation that a contraindication to the use of these agents is present for this encounter.  ACE Inhibitor or Angiotensin Receptor Blocker is contraindicated (specify all that apply)  []   ACEI allergy AND ARB allergy []   Angioedema []   Moderate or severe aortic stenosis []   Hyperkalemia [x]   Hypotension []   Renal artery stenosis [x]   Worsening renal function, preexisting renal disease or dysfunction   Manley Mason 11/09/2016 8:04 PM

## 2016-11-09 NOTE — Progress Notes (Signed)
[facility]  Patient Status: Active HRI pt with [facility] effective 08/09/2016  [facility] is providing the following services: RN  If patient discharges after hours, please call [phone].   Victorino Dike, MPH SW navigation [facility] 11/09/2016, 10:18 AM  [phone]

## 2016-11-09 NOTE — Progress Notes (Signed)
Bedside reporting done with Carla,RN. No significant changes noted. Attended to his needs.

## 2016-11-09 NOTE — Care Management Note (Signed)
Case Management Note  Patient Details  Name: [name] MRN: [id number] Date of Birth: 10/22/1932  Subjective/Objective:     CM following for progression and d/c planning.                Action/Plan: 11/09/2016 [facility] by CM reviewer that pt is not meeting criteria for OBS or INPT, discussed with pt attending , Dr Verlon Au , who has spoke with pt daughter. Both pt and daughter wish for the pt to remain in the hospital . This CM spoke with the pt daughter, Ms L Roselie Awkward re possibility of pt insurance not covering a continues stay. Per Ms Roselie Awkward she is not concerned at this time. The pt, [name] is not able to discuss this possibility as he states that this CM "worries too much". Pt continuing to remove oxygen ad lib. Complaining of weakness and dizziness when ambulating . Dr Verlon Au notified of possible noncoverage and has attempted to explain pt issues with Ms Roselie Awkward based on pt multiple health issue , however she insist that he need ongoing hospitalization.     Expected Discharge Date:                  Expected Discharge Plan:  [place]  In-House Referral:  NA  Discharge planning Services  CM Consult  Post Acute Care Choice:  Home Health Choice offered to:  Patient  DME Arranged:    DME Agency:     HH Arranged:  RN [place] Agency:  [place]  Status of Service:  In process, will continue to follow  If discussed at Long Length of Stay Meetings, dates discussed:    Additional Comments:  Adron Bene, RN 11/09/2016, 5:45 PM

## 2016-11-09 NOTE — Progress Notes (Signed)
Physical Therapy Treatment Patient Details Name: [name] MRN: [id number] DOB: 1933-01-08 Today's Date: 11/09/2016    History of Present Illness Pt is an 83 y.o. male with medical history significant for coronary artery disease status post CABG, ischemic cardiomyopathy with EF 10-15%, chronic atrial fibrillation on Eliquis, COPD, CVA, DM and chronic kidney disease stage III.  He presented to the emergency department after a syncopal episode at home.      PT Comments    Patient is making slow improvements with mobility and gait.    Follow Up Recommendations  Home health PT;Supervision for mobility/OOB     Equipment Recommendations  None recommended by PT    Recommendations for Other Services       Precautions / Restrictions Precautions Precautions: Fall Restrictions Weight Bearing Restrictions: No    Mobility  Bed Mobility Overal bed mobility: Modified Independent Bed Mobility: Sit to Supine       Sit to supine: Modified independent (Device/Increase time)   General bed mobility comments: No physical assist needed to return to supine.  Transfers Overall transfer level: Needs assistance Equipment used: None Transfers: Sit to/from Stand Sit to Stand: Min guard         General transfer comment: Assist for safety only.  Ambulation/Gait Ambulation/Gait assistance: Min guard Ambulation Distance (Feet): 24 Feet Assistive device: None Gait Pattern/deviations: Step-through pattern;Decreased step length - right;Decreased step length - left;Decreased stride length;Shuffle;Drifts right/left Gait velocity: decreased Gait velocity interpretation: Below normal speed for age/gender General Gait Details: Patient with slow, shuffling gait.  Patient holds on to furniture/objects in room for balance - states that is how he walks at home.  Fatigues quickly requiring standing break after 12'.  Ambulated with O2 on.   Stairs            Wheelchair Mobility     Modified Rankin (Stroke Patients Only)       Balance Overall balance assessment: Needs assistance         Standing balance support: No upper extremity supported Standing balance-Leahy Scale: Fair Standing balance comment: Able to maintain static balance with no UE support.                            Cognition Arousal/Alertness: Awake/alert Behavior During Therapy: WFL for tasks assessed/performed;Restless Overall Cognitive Status: Within Functional Limits for tasks assessed                                        Exercises      General Comments        Pertinent Vitals/Pain Pain Assessment: No/denies pain    Home Living                      Prior Function            PT Goals (current goals can now be found in the care plan section) Acute Rehab PT Goals Patient Stated Goal: home Progress towards PT goals: Progressing toward goals    Frequency    Min 3X/week      PT Plan Current plan remains appropriate    Co-evaluation              AM-PAC PT "6 Clicks" Daily Activity  Outcome Measure  Difficulty turning over in bed (including adjusting bedclothes, sheets and blankets)?: None Difficulty moving from lying  on back to sitting on the side of the bed? : None Difficulty sitting down on and standing up from a chair with arms (e.g., wheelchair, bedside commode, etc,.)?: A Little Help needed moving to and from a bed to chair (including a wheelchair)?: A Little Help needed walking in hospital room?: A Little Help needed climbing 3-5 steps with a railing? : A Little 6 Click Score: 20    End of Session Equipment Utilized During Treatment: Gait belt;Oxygen Activity Tolerance: Patient limited by fatigue Patient left: in bed;with call bell/phone within reach;with nursing/sitter in room (Nsg to turn on bed alarm after working with IV) Nurse Communication: Mobility status (IV bleeding) PT Visit Diagnosis: History of  falling (Z91.81);Difficulty in walking, not elsewhere classified (R26.2)     Time: 1694-5038 PT Time Calculation (min) (ACUTE ONLY): 10 min  Charges:  $Gait Training: 8-22 mins                    G Codes:       Carita Pian. Sanjuana Kava, [facility] Acute Rehab Services Pager [place] 11/09/2016, 12:13 PM

## 2016-11-10 DIAGNOSIS — I951 Orthostatic hypotension: Secondary | ICD-10-CM | POA: Diagnosis not present

## 2016-11-10 DIAGNOSIS — R55 Syncope and collapse: Secondary | ICD-10-CM | POA: Diagnosis not present

## 2016-11-10 LAB — GLUCOSE, CAPILLARY: Glucose-Capillary: 109 mg/dL — ABNORMAL HIGH (ref 65–99)

## 2016-11-10 MED ORDER — FUROSEMIDE 20 MG PO TABS: 20.0000 mg | ORAL_TABLET | ORAL | 0 refills | Status: AC

## 2016-11-10 MED ORDER — MIDODRINE HCL 10 MG PO TABS: 10.0000 mg | ORAL_TABLET | Freq: Three times a day (TID) | ORAL | 0 refills | Status: AC

## 2016-11-10 MED ORDER — APIXABAN 2.5 MG PO TABS: 2.5000 mg | ORAL_TABLET | Freq: Two times a day (BID) | ORAL | 0 refills | Status: AC

## 2016-11-10 MED ORDER — ISOSORBIDE DINITRATE 5 MG PO TABS: 2.5000 mg | ORAL_TABLET | Freq: Three times a day (TID) | ORAL | 0 refills | Status: AC

## 2016-11-10 NOTE — Plan of Care (Signed)
Problem: Physical Regulation: Goal: Ability to maintain clinical measurements within normal limits will improve Outcome: Progressing Patient O2 sat 100% on 2.5 l/m nasal canula. Ambulated on RA with RW. O2 sat down to 91%. Will continue to monitor. Bartholomew Crews, RN

## 2016-11-10 NOTE — Progress Notes (Signed)
Patient discharged home per MD. Discharge instructions reviewed with patient and family. Daughter verbalized understanding to pick up medications from pharmacy and how patient is to take medications as instructed by MD. NT escorted patient out via wheelchair. Bartholomew Crews, RN

## 2016-11-10 NOTE — Discharge Summary (Signed)
Physician Discharge Summary  [name] OBS:[id number] DOB: Jul 04, 1932 DOA: 11/07/2016  PCP: [name], MD  Admit date: 11/07/2016 Discharge date: 11/10/2016  Time spent: 35 minutes  Recommendations for Outpatient Follow-up:  1.  family unwilling to accept Hospice Philosophy--suggest OP re-engaement with HH rn--will forward to PCP and Dr. Percival Spanish who put him on hospice 2. Changed Imdur to Isordil 2.5 tid, change demedex to lasix 20 qod, added midodrine 10 tid 3. Suggest OP discussion re: elliquis--fall risk?  Discharge Diagnoses:  Principal Problem:   Syncope and collapse Active Problems:   Chronic a-fib (HCC)   S/P St Jude BiV ICD upgrade 4765   Systolic CHF, chronic (HCC)   Weakness generalized   Syncope   Chronic respiratory failure with hypoxia (HCC)   Chronic renal disease, stage III   Cardiomyopathy, ischemic   DM2 (diabetes mellitus, type 2) (HCC)   Macrocytic anemia   Orthostatic hypotension   Hyponatremia   COPD GOLD 0    Pancytopenia ([place])   Discharge Condition: gaurded  Diet recommendation: heart healhrty  Filed Weights   11/08/16 0804 11/08/16 2100 11/09/16 2027  Weight: 58.1 kg (128 lb 1.4 oz) 54.4 kg (120 lb) 54.3 kg (119 lb 11.2 oz)    History of present illness:  83 year old male Ischemic cardiomyopathy EF 10-15% + PPM St. Jude CAD prior CABG 2004 Sick sinus syndrome CHad2Vasc2 score= 5, on Elliquis Prior CVA Prostate cancer recent score 9 2012 Chronic respiratory failure on home O2 2.5 L secondary to COPD? BLack lung disease? Type 2 diabetes mellitus Chronic kidney disease stage III Previously on home hospice from February 2018 up until the middle of 2018--- this was requested his cardiologist   Admitted through the emergency room 8/30 Became severely lightheaded at home had an assisted fall to the ground EMS transported the patient             Appears that his outpatient physicians have in trying to adjust his medications because of  generalized weak( Noted 29 mmHg drop in systolic pressure with positional change Noncontrast CT head negative for abnormality Chronic pancytopenia on labs Pacemaker interrogated and found to be normal  Hospital Course:  Syncope most likely from orthostasis secondary to diuretics             Torsemide 20 mg twice a day on hold             Holding IV fluids              Imdur 15 daily--have changed the patient to 2.5 mg of Isordil 3 times.             walked about 50 ft today--orthostatics mildy + --would try TEd hose, added 5 mg tid midodrine-->10 8/30             Will need HH RN  Ischemic cardiomyopathy-severe-EF about 46% Chronic systolic heart failure Sick sinus syndrome with St. Jude pacemaker-last generator upgrade 2015             Echo --showed mild aortic stenosis which can probably cause #1 4 at least contribute             bnp 1619 on admission             Point-of-care troponin is negative and patient not having chest pain  Chronic kidney disease stage II to 3 Hypovolemic Hyponatremia                 Diuretics on hold accounting  for mild decrease in sodium             May need to reimplement cautiously--starting back lasix 20 qod  Black lung disease Chronic respiratory failure on home oxygen 2 L COPD Gold stage 0             Continue oxygen for now-continue albuterol every 6 when necessary wheeze  Prostate cancer Gleason score 9             History of radioactive seeds             Continue Flomax 0.4 at bedtime  Gout continue allopurinol 300 daily    Discharge Exam: Vitals:   11/10/16 0629 11/10/16 0900  BP: 106/60 (!) 121/56  Pulse: 69 79  Resp: 17 18  Temp: 97.8 F (36.6 C) 97.7 F (36.5 C)  SpO2: 100% 100%    General: alert, ready for home Cardiovascular:  s1 s 2no m/r/g Respiratory: clear   Discharge Instructions   Discharge Instructions    Diet - low sodium heart healthy    Complete by:  As directed    Increase activity slowly     Complete by:  As directed      Current Discharge Medication List    START taking these medications   Details  furosemide (LASIX) 20 MG tablet Take 1 tablet (20 mg total) by mouth every other day. Qty: 30 tablet, Refills: 0    isosorbide dinitrate (ISORDIL) 5 MG tablet Take 0.5 tablets (2.5 mg total) by mouth 3 (three) times daily. Qty: 30 tablet, Refills: 0    midodrine (PROAMATINE) 10 MG tablet Take 1 tablet (10 mg total) by mouth 3 (three) times daily with meals. Qty: 90 tablet, Refills: 0      CONTINUE these medications which have CHANGED   Details  apixaban (ELIQUIS) 2.5 MG TABS tablet Take 1 tablet (2.5 mg total) by mouth 2 (two) times daily. Qty: 60 tablet, Refills: 0      CONTINUE these medications which have NOT CHANGED   Details  albuterol (PROVENTIL HFA;VENTOLIN HFA) 108 (90 Base) MCG/ACT inhaler Inhale 2 puffs into the lungs every 4 (four) hours as needed for wheezing or shortness of breath. Qty: 1 Inhaler, Refills: 2    allopurinol (ZYLOPRIM) 300 MG tablet Take 300 mg by mouth daily.      CALCIUM-VITAMIN D PO Take 1 tablet by mouth 2 (two) times daily.     nitroGLYCERIN (NITROSTAT) 0.4 MG SL tablet Place 0.4 mg under the tongue every 5 (five) minutes as needed for chest pain.    omeprazole (PRILOSEC) 40 MG capsule Take 1 capsule (40 mg total) by mouth 2 (two) times daily. Qty: 60 capsule, Refills: 1    OXYGEN Inhale 2.5 L into the lungs continuous.    tamsulosin (FLOMAX) 0.4 MG CAPS capsule Take 0.4 mg by mouth at bedtime. Refills: 1    vitamin B-12 (CYANOCOBALAMIN) 1000 MCG tablet Take 1,000 mcg by mouth 2 (two) times daily.    feeding supplement, ENSURE ENLIVE, (ENSURE ENLIVE) LIQD Take 237 mLs by mouth 2 (two) times daily between meals. Qty: 237 mL, Refills: 12    hydrocortisone cream 1 % Apply 1 application topically 2 (two) times daily as needed for itching.    ondansetron (ZOFRAN ODT) 4 MG disintegrating tablet Take 1 tablet (4 mg total) by mouth  every 8 (eight) hours as needed for nausea or vomiting. Qty: 12 tablet, Refills: 0      STOP taking these medications  albuterol (PROVENTIL) (2.5 MG/3ML) 0.083% nebulizer solution      fexofenadine (ALLEGRA) 180 MG tablet      isosorbide mononitrate (IMDUR) 30 MG 24 hr tablet      Omega-3 Fatty Acids (FISH OIL) 1000 MG CAPS      potassium chloride SA (K-DUR,KLOR-CON) 20 MEQ tablet      torsemide (DEMADEX) 20 MG tablet      potassium chloride SA (K-DUR,KLOR-CON) 10 MEQ tablet        Allergies  Allergen Reactions  . [place] [Digoxin] Other (See Comments)    Caused patient to have chest pains and dizzy spells  . Aspirin Hives  . Penicillins Rash    Rash Has patient had a PCN reaction causing immediate rash, facial/tongue/throat swelling, SOB or lightheadedness with hypotension:YES Has patient had a PCN reaction causing severe rash involving mucus membranes or skin necrosis: NO Has patient had a PCN reaction that required hospitalization NO Has patient had a PCN reaction occurring within the last 10 years:NO If all of the above answers are "NO", then may proceed with Cephalosporin use.      The results of significant diagnostics from this hospitalization (including imaging, microbiology, ancillary and laboratory) are listed below for reference.    Significant Diagnostic Studies: Dg Chest 2 View  Result Date: 11/07/2016 CLINICAL DATA:  Syncope. EXAM: CHEST  2 VIEW COMPARISON:  08/17/2016 FINDINGS: Stable postsurgical changes from CABG. Stable positioning of pacemaker leads. Cardiomediastinal silhouette is moderately enlarged. Mediastinal contours appear intact. Calcific atherosclerotic disease and tortuosity of the aorta. There is no evidence of focal airspace consolidation, pleural effusion or pneumothorax. Mild pulmonary vascular congestion. Osseous structures are without acute abnormality. Soft tissues are grossly normal. IMPRESSION: Stably enlarged cardiac silhouette with  mild pulmonary vascular congestion. No evidence of overt pulmonary edema or focal airspace consolidation. Electronically Signed   By: Fidela Salisbury M.D.   On: 11/07/2016 16:53   Dg Elbow Complete Left  Result Date: 11/07/2016 CLINICAL DATA:  Syncope today with abrasion of the left elbow after fall. EXAM: LEFT ELBOW - COMPLETE 3+ VIEW COMPARISON:  None. FINDINGS: There is no evidence of acute fracture, dislocation, or joint effusion. Small enthesophyte at the triceps insertion. Mild soft tissue swelling is noted posteriorly. There is no evidence of arthropathy or other focal bone abnormality. Soft IMPRESSION: No acute fracture or dislocation of the left elbow. Mild dorsal soft tissue swelling with tiny olecranon spur. Electronically Signed   By: Ashley Royalty M.D.   On: 11/07/2016 16:53   Ct Head Wo Contrast  Result Date: 11/07/2016 CLINICAL DATA:  Generalize weakness for 2 weeks, syncopal episode EXAM: CT HEAD WITHOUT CONTRAST TECHNIQUE: Contiguous axial images were obtained from the base of the skull through the vertex without intravenous contrast. COMPARISON:  01/20/2016, 06/04/2013 FINDINGS: Brain: No acute territorial infarction, hemorrhage, or intracranial mass is seen. Small old infarct in the right cerebellum. Atrophy and mild small vessel ischemic changes of the white matter. Stable ventricle size. Vascular: No hyperdense vessels.  Carotid artery calcifications. Skull: No fracture or suspicious bone lesion. Sinuses/Orbits: Mild mucosal thickening in the ethmoid sinuses. No acute orbital abnormality. Bilateral lens extraction. Other: Stable coarse calcification in the left parotid space. IMPRESSION: 1. No CT evidence for acute intracranial abnormality. 2. Atrophy and mild small vessel ischemic changes of the white matter Electronically Signed   By: Donavan Foil M.D.   On: 11/07/2016 18:10   Dg Hip Unilat W Or Wo Pelvis 2-3 Views Left  Result Date: 11/07/2016  CLINICAL DATA:  Syncopal episode  this morning. Left buttock an upper left femoral pain. EXAM: DG HIP (WITH OR WITHOUT PELVIS) 2-3V LEFT COMPARISON:  None. FINDINGS: Eccentric disc space narrowing is noted at L4-5 with disc space narrowing on the right. The bony pelvis appears intact. No pelvic diastasis. Three surgical clips project over the pubic symphysis. Hip joints are maintained bilaterally. No acute fracture nor dislocations. Soft tissue ossifications are present adjacent to the greater trochanter which may reflect stigmata of gluteal tendinopathy. Extensive vascular calcifications seen bilaterally along the course the internal and external iliacs through femoral and branch vessel arteries. IMPRESSION: No acute osseous abnormality of the bony pelvis and left hip. Electronically Signed   By: Ashley Royalty M.D.   On: 11/07/2016 16:55    Microbiology: No results found for this or any previous visit (from the past 240 hour(s)).   Labs: Basic Metabolic Panel:  Recent Labs Lab 11/07/16 1440 11/08/16 0352 11/09/16 0326  NA 131* 131* 131*  K 4.3 3.8 3.9  CL 94* 94* 95*  CO2 27 29 28   GLUCOSE 134* 81 87  BUN 23* 23* 21*  CREATININE 1.38* 1.18 1.14  CALCIUM 9.0 8.8* 8.6*  PHOS  --   --  3.9   Liver Function Tests:  Recent Labs Lab 11/09/16 0326  ALBUMIN 2.5*   No results for input(s): LIPASE, AMYLASE in the last 168 hours. No results for input(s): AMMONIA in the last 168 hours. CBC:  Recent Labs Lab 11/07/16 1440 11/08/16 0352 11/09/16 0326  WBC 2.7* 2.5* 3.3*  NEUTROABS  --   --  1.7  HGB 8.4* 8.3* 8.2*  HCT 26.0* 26.1* 25.5*  MCV 104.8* 103.6* 104.1*  PLT 135* 156 134*   Cardiac Enzymes:  Recent Labs Lab 11/07/16 1607  TROPONINI <0.03   BNP: BNP (last 3 results)  Recent Labs  07/24/16 2144 08/03/16 1146 11/07/16 1440  BNP 3,696.9* 2,772.1* 1,619.3*    ProBNP (last 3 results) No results for input(s): PROBNP in the last 8760 hours.  CBG:  Recent Labs Lab 11/07/16 1607 11/08/16 0751  11/09/16 0757 11/10/16 0738  GLUCAP 95 104* 102* 109*       Signed:  Nita Sells MD   [facility] 11/10/2016, 10:42 AM

## 2016-11-10 NOTE — Plan of Care (Signed)
Problem: Education: Goal: Knowledge of  General Education information/materials will improve Outcome: Progressing POC reviewed with pt.   

## 2016-11-11 DIAGNOSIS — I255 Ischemic cardiomyopathy: Secondary | ICD-10-CM | POA: Diagnosis not present

## 2016-11-11 DIAGNOSIS — N183 Chronic kidney disease, stage 3 (moderate): Secondary | ICD-10-CM | POA: Diagnosis not present

## 2016-11-11 DIAGNOSIS — E871 Hypo-osmolality and hyponatremia: Secondary | ICD-10-CM | POA: Diagnosis not present

## 2016-11-11 DIAGNOSIS — I5043 Acute on chronic combined systolic (congestive) and diastolic (congestive) heart failure: Secondary | ICD-10-CM | POA: Diagnosis not present

## 2016-11-11 DIAGNOSIS — J628 Pneumoconiosis due to other dust containing silica: Secondary | ICD-10-CM | POA: Diagnosis not present

## 2016-11-11 DIAGNOSIS — I482 Chronic atrial fibrillation: Secondary | ICD-10-CM | POA: Diagnosis not present

## 2016-11-11 DIAGNOSIS — Z950 Presence of cardiac pacemaker: Secondary | ICD-10-CM | POA: Diagnosis not present

## 2016-11-11 DIAGNOSIS — E1122 Type 2 diabetes mellitus with diabetic chronic kidney disease: Secondary | ICD-10-CM | POA: Diagnosis not present

## 2016-11-11 DIAGNOSIS — Z9981 Dependence on supplemental oxygen: Secondary | ICD-10-CM | POA: Diagnosis not present

## 2016-11-11 DIAGNOSIS — I130 Hypertensive heart and chronic kidney disease with heart failure and stage 1 through stage 4 chronic kidney disease, or unspecified chronic kidney disease: Secondary | ICD-10-CM | POA: Diagnosis not present

## 2016-11-11 DIAGNOSIS — J9620 Acute and chronic respiratory failure, unspecified whether with hypoxia or hypercapnia: Secondary | ICD-10-CM | POA: Diagnosis not present

## 2016-11-11 DIAGNOSIS — J449 Chronic obstructive pulmonary disease, unspecified: Secondary | ICD-10-CM | POA: Diagnosis not present

## 2016-11-11 DIAGNOSIS — D638 Anemia in other chronic diseases classified elsewhere: Secondary | ICD-10-CM | POA: Diagnosis not present

## 2016-11-12 DIAGNOSIS — D638 Anemia in other chronic diseases classified elsewhere: Secondary | ICD-10-CM | POA: Diagnosis not present

## 2016-11-12 DIAGNOSIS — I130 Hypertensive heart and chronic kidney disease with heart failure and stage 1 through stage 4 chronic kidney disease, or unspecified chronic kidney disease: Secondary | ICD-10-CM | POA: Diagnosis not present

## 2016-11-12 DIAGNOSIS — N183 Chronic kidney disease, stage 3 (moderate): Secondary | ICD-10-CM | POA: Diagnosis not present

## 2016-11-12 DIAGNOSIS — J449 Chronic obstructive pulmonary disease, unspecified: Secondary | ICD-10-CM | POA: Diagnosis not present

## 2016-11-12 DIAGNOSIS — J9620 Acute and chronic respiratory failure, unspecified whether with hypoxia or hypercapnia: Secondary | ICD-10-CM | POA: Diagnosis not present

## 2016-11-12 DIAGNOSIS — Z950 Presence of cardiac pacemaker: Secondary | ICD-10-CM | POA: Diagnosis not present

## 2016-11-12 DIAGNOSIS — J628 Pneumoconiosis due to other dust containing silica: Secondary | ICD-10-CM | POA: Diagnosis not present

## 2016-11-12 DIAGNOSIS — E1122 Type 2 diabetes mellitus with diabetic chronic kidney disease: Secondary | ICD-10-CM | POA: Diagnosis not present

## 2016-11-12 DIAGNOSIS — E871 Hypo-osmolality and hyponatremia: Secondary | ICD-10-CM | POA: Diagnosis not present

## 2016-11-12 DIAGNOSIS — I482 Chronic atrial fibrillation: Secondary | ICD-10-CM | POA: Diagnosis not present

## 2016-11-12 DIAGNOSIS — I255 Ischemic cardiomyopathy: Secondary | ICD-10-CM | POA: Diagnosis not present

## 2016-11-12 DIAGNOSIS — Z9981 Dependence on supplemental oxygen: Secondary | ICD-10-CM | POA: Diagnosis not present

## 2016-11-12 DIAGNOSIS — I5043 Acute on chronic combined systolic (congestive) and diastolic (congestive) heart failure: Secondary | ICD-10-CM | POA: Diagnosis not present

## 2016-11-15 DIAGNOSIS — Z9981 Dependence on supplemental oxygen: Secondary | ICD-10-CM | POA: Diagnosis not present

## 2016-11-15 DIAGNOSIS — J449 Chronic obstructive pulmonary disease, unspecified: Secondary | ICD-10-CM | POA: Diagnosis not present

## 2016-11-15 DIAGNOSIS — J628 Pneumoconiosis due to other dust containing silica: Secondary | ICD-10-CM | POA: Diagnosis not present

## 2016-11-15 DIAGNOSIS — I482 Chronic atrial fibrillation: Secondary | ICD-10-CM | POA: Diagnosis not present

## 2016-11-15 DIAGNOSIS — J9620 Acute and chronic respiratory failure, unspecified whether with hypoxia or hypercapnia: Secondary | ICD-10-CM | POA: Diagnosis not present

## 2016-11-15 DIAGNOSIS — D638 Anemia in other chronic diseases classified elsewhere: Secondary | ICD-10-CM | POA: Diagnosis not present

## 2016-11-15 DIAGNOSIS — Z950 Presence of cardiac pacemaker: Secondary | ICD-10-CM | POA: Diagnosis not present

## 2016-11-15 DIAGNOSIS — N183 Chronic kidney disease, stage 3 (moderate): Secondary | ICD-10-CM | POA: Diagnosis not present

## 2016-11-15 DIAGNOSIS — I130 Hypertensive heart and chronic kidney disease with heart failure and stage 1 through stage 4 chronic kidney disease, or unspecified chronic kidney disease: Secondary | ICD-10-CM | POA: Diagnosis not present

## 2016-11-15 DIAGNOSIS — E1122 Type 2 diabetes mellitus with diabetic chronic kidney disease: Secondary | ICD-10-CM | POA: Diagnosis not present

## 2016-11-15 DIAGNOSIS — I255 Ischemic cardiomyopathy: Secondary | ICD-10-CM | POA: Diagnosis not present

## 2016-11-15 DIAGNOSIS — E871 Hypo-osmolality and hyponatremia: Secondary | ICD-10-CM | POA: Diagnosis not present

## 2016-11-15 DIAGNOSIS — I5043 Acute on chronic combined systolic (congestive) and diastolic (congestive) heart failure: Secondary | ICD-10-CM | POA: Diagnosis not present

## 2016-11-16 DIAGNOSIS — J9620 Acute and chronic respiratory failure, unspecified whether with hypoxia or hypercapnia: Secondary | ICD-10-CM | POA: Diagnosis not present

## 2016-11-16 DIAGNOSIS — Z9981 Dependence on supplemental oxygen: Secondary | ICD-10-CM | POA: Diagnosis not present

## 2016-11-16 DIAGNOSIS — E871 Hypo-osmolality and hyponatremia: Secondary | ICD-10-CM | POA: Diagnosis not present

## 2016-11-16 DIAGNOSIS — J628 Pneumoconiosis due to other dust containing silica: Secondary | ICD-10-CM | POA: Diagnosis not present

## 2016-11-16 DIAGNOSIS — Z950 Presence of cardiac pacemaker: Secondary | ICD-10-CM | POA: Diagnosis not present

## 2016-11-16 DIAGNOSIS — I5043 Acute on chronic combined systolic (congestive) and diastolic (congestive) heart failure: Secondary | ICD-10-CM | POA: Diagnosis not present

## 2016-11-16 DIAGNOSIS — D638 Anemia in other chronic diseases classified elsewhere: Secondary | ICD-10-CM | POA: Diagnosis not present

## 2016-11-16 DIAGNOSIS — I130 Hypertensive heart and chronic kidney disease with heart failure and stage 1 through stage 4 chronic kidney disease, or unspecified chronic kidney disease: Secondary | ICD-10-CM | POA: Diagnosis not present

## 2016-11-16 DIAGNOSIS — E1122 Type 2 diabetes mellitus with diabetic chronic kidney disease: Secondary | ICD-10-CM | POA: Diagnosis not present

## 2016-11-16 DIAGNOSIS — I255 Ischemic cardiomyopathy: Secondary | ICD-10-CM | POA: Diagnosis not present

## 2016-11-16 DIAGNOSIS — J449 Chronic obstructive pulmonary disease, unspecified: Secondary | ICD-10-CM | POA: Diagnosis not present

## 2016-11-16 DIAGNOSIS — I482 Chronic atrial fibrillation: Secondary | ICD-10-CM | POA: Diagnosis not present

## 2016-11-16 DIAGNOSIS — N183 Chronic kidney disease, stage 3 (moderate): Secondary | ICD-10-CM | POA: Diagnosis not present

## 2016-11-22 DIAGNOSIS — Z9981 Dependence on supplemental oxygen: Secondary | ICD-10-CM | POA: Diagnosis not present

## 2016-11-22 DIAGNOSIS — E871 Hypo-osmolality and hyponatremia: Secondary | ICD-10-CM | POA: Diagnosis not present

## 2016-11-22 DIAGNOSIS — I255 Ischemic cardiomyopathy: Secondary | ICD-10-CM | POA: Diagnosis not present

## 2016-11-22 DIAGNOSIS — J449 Chronic obstructive pulmonary disease, unspecified: Secondary | ICD-10-CM | POA: Diagnosis not present

## 2016-11-22 DIAGNOSIS — E1122 Type 2 diabetes mellitus with diabetic chronic kidney disease: Secondary | ICD-10-CM | POA: Diagnosis not present

## 2016-11-22 DIAGNOSIS — I5043 Acute on chronic combined systolic (congestive) and diastolic (congestive) heart failure: Secondary | ICD-10-CM | POA: Diagnosis not present

## 2016-11-22 DIAGNOSIS — I482 Chronic atrial fibrillation: Secondary | ICD-10-CM | POA: Diagnosis not present

## 2016-11-22 DIAGNOSIS — J9620 Acute and chronic respiratory failure, unspecified whether with hypoxia or hypercapnia: Secondary | ICD-10-CM | POA: Diagnosis not present

## 2016-11-22 DIAGNOSIS — Z950 Presence of cardiac pacemaker: Secondary | ICD-10-CM | POA: Diagnosis not present

## 2016-11-22 DIAGNOSIS — D638 Anemia in other chronic diseases classified elsewhere: Secondary | ICD-10-CM | POA: Diagnosis not present

## 2016-11-22 DIAGNOSIS — N183 Chronic kidney disease, stage 3 (moderate): Secondary | ICD-10-CM | POA: Diagnosis not present

## 2016-11-22 DIAGNOSIS — J628 Pneumoconiosis due to other dust containing silica: Secondary | ICD-10-CM | POA: Diagnosis not present

## 2016-11-22 DIAGNOSIS — R531 Weakness: Secondary | ICD-10-CM | POA: Diagnosis not present

## 2016-11-22 DIAGNOSIS — I130 Hypertensive heart and chronic kidney disease with heart failure and stage 1 through stage 4 chronic kidney disease, or unspecified chronic kidney disease: Secondary | ICD-10-CM | POA: Diagnosis not present

## 2016-11-24 DIAGNOSIS — E1122 Type 2 diabetes mellitus with diabetic chronic kidney disease: Secondary | ICD-10-CM | POA: Diagnosis not present

## 2016-11-24 DIAGNOSIS — I130 Hypertensive heart and chronic kidney disease with heart failure and stage 1 through stage 4 chronic kidney disease, or unspecified chronic kidney disease: Secondary | ICD-10-CM | POA: Diagnosis not present

## 2016-11-24 DIAGNOSIS — J449 Chronic obstructive pulmonary disease, unspecified: Secondary | ICD-10-CM | POA: Diagnosis not present

## 2016-11-24 DIAGNOSIS — I5043 Acute on chronic combined systolic (congestive) and diastolic (congestive) heart failure: Secondary | ICD-10-CM | POA: Diagnosis not present

## 2016-11-24 DIAGNOSIS — J9620 Acute and chronic respiratory failure, unspecified whether with hypoxia or hypercapnia: Secondary | ICD-10-CM | POA: Diagnosis not present

## 2016-11-24 DIAGNOSIS — I482 Chronic atrial fibrillation: Secondary | ICD-10-CM | POA: Diagnosis not present

## 2016-11-24 DIAGNOSIS — N183 Chronic kidney disease, stage 3 (moderate): Secondary | ICD-10-CM | POA: Diagnosis not present

## 2016-11-24 DIAGNOSIS — Z9981 Dependence on supplemental oxygen: Secondary | ICD-10-CM | POA: Diagnosis not present

## 2016-11-24 DIAGNOSIS — D638 Anemia in other chronic diseases classified elsewhere: Secondary | ICD-10-CM | POA: Diagnosis not present

## 2016-11-24 DIAGNOSIS — J628 Pneumoconiosis due to other dust containing silica: Secondary | ICD-10-CM | POA: Diagnosis not present

## 2016-11-24 DIAGNOSIS — I255 Ischemic cardiomyopathy: Secondary | ICD-10-CM | POA: Diagnosis not present

## 2016-11-24 DIAGNOSIS — Z950 Presence of cardiac pacemaker: Secondary | ICD-10-CM | POA: Diagnosis not present

## 2016-11-24 DIAGNOSIS — E871 Hypo-osmolality and hyponatremia: Secondary | ICD-10-CM | POA: Diagnosis not present

## 2016-11-29 DIAGNOSIS — I482 Chronic atrial fibrillation: Secondary | ICD-10-CM | POA: Diagnosis not present

## 2016-11-29 DIAGNOSIS — I5043 Acute on chronic combined systolic (congestive) and diastolic (congestive) heart failure: Secondary | ICD-10-CM | POA: Diagnosis not present

## 2016-11-29 DIAGNOSIS — N183 Chronic kidney disease, stage 3 (moderate): Secondary | ICD-10-CM | POA: Diagnosis not present

## 2016-11-29 DIAGNOSIS — Z9981 Dependence on supplemental oxygen: Secondary | ICD-10-CM | POA: Diagnosis not present

## 2016-11-29 DIAGNOSIS — E1122 Type 2 diabetes mellitus with diabetic chronic kidney disease: Secondary | ICD-10-CM | POA: Diagnosis not present

## 2016-11-29 DIAGNOSIS — D638 Anemia in other chronic diseases classified elsewhere: Secondary | ICD-10-CM | POA: Diagnosis not present

## 2016-11-29 DIAGNOSIS — I255 Ischemic cardiomyopathy: Secondary | ICD-10-CM | POA: Diagnosis not present

## 2016-11-29 DIAGNOSIS — J628 Pneumoconiosis due to other dust containing silica: Secondary | ICD-10-CM | POA: Diagnosis not present

## 2016-11-29 DIAGNOSIS — J9620 Acute and chronic respiratory failure, unspecified whether with hypoxia or hypercapnia: Secondary | ICD-10-CM | POA: Diagnosis not present

## 2016-11-29 DIAGNOSIS — I130 Hypertensive heart and chronic kidney disease with heart failure and stage 1 through stage 4 chronic kidney disease, or unspecified chronic kidney disease: Secondary | ICD-10-CM | POA: Diagnosis not present

## 2016-11-29 DIAGNOSIS — E871 Hypo-osmolality and hyponatremia: Secondary | ICD-10-CM | POA: Diagnosis not present

## 2016-11-29 DIAGNOSIS — J449 Chronic obstructive pulmonary disease, unspecified: Secondary | ICD-10-CM | POA: Diagnosis not present

## 2016-11-29 DIAGNOSIS — Z950 Presence of cardiac pacemaker: Secondary | ICD-10-CM | POA: Diagnosis not present

## 2016-11-30 DIAGNOSIS — I482 Chronic atrial fibrillation: Secondary | ICD-10-CM | POA: Diagnosis not present

## 2016-11-30 DIAGNOSIS — I130 Hypertensive heart and chronic kidney disease with heart failure and stage 1 through stage 4 chronic kidney disease, or unspecified chronic kidney disease: Secondary | ICD-10-CM | POA: Diagnosis not present

## 2016-11-30 DIAGNOSIS — J449 Chronic obstructive pulmonary disease, unspecified: Secondary | ICD-10-CM | POA: Diagnosis not present

## 2016-11-30 DIAGNOSIS — I255 Ischemic cardiomyopathy: Secondary | ICD-10-CM | POA: Diagnosis not present

## 2016-11-30 DIAGNOSIS — J9620 Acute and chronic respiratory failure, unspecified whether with hypoxia or hypercapnia: Secondary | ICD-10-CM | POA: Diagnosis not present

## 2016-11-30 DIAGNOSIS — E1122 Type 2 diabetes mellitus with diabetic chronic kidney disease: Secondary | ICD-10-CM | POA: Diagnosis not present

## 2016-11-30 DIAGNOSIS — E871 Hypo-osmolality and hyponatremia: Secondary | ICD-10-CM | POA: Diagnosis not present

## 2016-11-30 DIAGNOSIS — Z950 Presence of cardiac pacemaker: Secondary | ICD-10-CM | POA: Diagnosis not present

## 2016-11-30 DIAGNOSIS — I5043 Acute on chronic combined systolic (congestive) and diastolic (congestive) heart failure: Secondary | ICD-10-CM | POA: Diagnosis not present

## 2016-11-30 DIAGNOSIS — J628 Pneumoconiosis due to other dust containing silica: Secondary | ICD-10-CM | POA: Diagnosis not present

## 2016-11-30 DIAGNOSIS — D638 Anemia in other chronic diseases classified elsewhere: Secondary | ICD-10-CM | POA: Diagnosis not present

## 2016-11-30 DIAGNOSIS — N183 Chronic kidney disease, stage 3 (moderate): Secondary | ICD-10-CM | POA: Diagnosis not present

## 2016-11-30 DIAGNOSIS — Z9981 Dependence on supplemental oxygen: Secondary | ICD-10-CM | POA: Diagnosis not present

## 2016-12-04 DIAGNOSIS — I5043 Acute on chronic combined systolic (congestive) and diastolic (congestive) heart failure: Secondary | ICD-10-CM | POA: Diagnosis not present

## 2016-12-04 DIAGNOSIS — E871 Hypo-osmolality and hyponatremia: Secondary | ICD-10-CM | POA: Diagnosis not present

## 2016-12-04 DIAGNOSIS — I130 Hypertensive heart and chronic kidney disease with heart failure and stage 1 through stage 4 chronic kidney disease, or unspecified chronic kidney disease: Secondary | ICD-10-CM | POA: Diagnosis not present

## 2016-12-04 DIAGNOSIS — Z9981 Dependence on supplemental oxygen: Secondary | ICD-10-CM | POA: Diagnosis not present

## 2016-12-04 DIAGNOSIS — N183 Chronic kidney disease, stage 3 (moderate): Secondary | ICD-10-CM | POA: Diagnosis not present

## 2016-12-04 DIAGNOSIS — I255 Ischemic cardiomyopathy: Secondary | ICD-10-CM | POA: Diagnosis not present

## 2016-12-04 DIAGNOSIS — J628 Pneumoconiosis due to other dust containing silica: Secondary | ICD-10-CM | POA: Diagnosis not present

## 2016-12-04 DIAGNOSIS — J449 Chronic obstructive pulmonary disease, unspecified: Secondary | ICD-10-CM | POA: Diagnosis not present

## 2016-12-04 DIAGNOSIS — E1122 Type 2 diabetes mellitus with diabetic chronic kidney disease: Secondary | ICD-10-CM | POA: Diagnosis not present

## 2016-12-04 DIAGNOSIS — J9620 Acute and chronic respiratory failure, unspecified whether with hypoxia or hypercapnia: Secondary | ICD-10-CM | POA: Diagnosis not present

## 2016-12-04 DIAGNOSIS — D638 Anemia in other chronic diseases classified elsewhere: Secondary | ICD-10-CM | POA: Diagnosis not present

## 2016-12-04 DIAGNOSIS — I482 Chronic atrial fibrillation: Secondary | ICD-10-CM | POA: Diagnosis not present

## 2016-12-04 DIAGNOSIS — Z950 Presence of cardiac pacemaker: Secondary | ICD-10-CM | POA: Diagnosis not present

## 2016-12-05 DIAGNOSIS — J628 Pneumoconiosis due to other dust containing silica: Secondary | ICD-10-CM | POA: Diagnosis not present

## 2016-12-05 DIAGNOSIS — I482 Chronic atrial fibrillation: Secondary | ICD-10-CM | POA: Diagnosis not present

## 2016-12-05 DIAGNOSIS — I255 Ischemic cardiomyopathy: Secondary | ICD-10-CM | POA: Diagnosis not present

## 2016-12-05 DIAGNOSIS — Z9981 Dependence on supplemental oxygen: Secondary | ICD-10-CM | POA: Diagnosis not present

## 2016-12-05 DIAGNOSIS — I130 Hypertensive heart and chronic kidney disease with heart failure and stage 1 through stage 4 chronic kidney disease, or unspecified chronic kidney disease: Secondary | ICD-10-CM | POA: Diagnosis not present

## 2016-12-05 DIAGNOSIS — D638 Anemia in other chronic diseases classified elsewhere: Secondary | ICD-10-CM | POA: Diagnosis not present

## 2016-12-05 DIAGNOSIS — J9620 Acute and chronic respiratory failure, unspecified whether with hypoxia or hypercapnia: Secondary | ICD-10-CM | POA: Diagnosis not present

## 2016-12-05 DIAGNOSIS — I5043 Acute on chronic combined systolic (congestive) and diastolic (congestive) heart failure: Secondary | ICD-10-CM | POA: Diagnosis not present

## 2016-12-05 DIAGNOSIS — N183 Chronic kidney disease, stage 3 (moderate): Secondary | ICD-10-CM | POA: Diagnosis not present

## 2016-12-05 DIAGNOSIS — Z950 Presence of cardiac pacemaker: Secondary | ICD-10-CM | POA: Diagnosis not present

## 2016-12-05 DIAGNOSIS — E871 Hypo-osmolality and hyponatremia: Secondary | ICD-10-CM | POA: Diagnosis not present

## 2016-12-05 DIAGNOSIS — J449 Chronic obstructive pulmonary disease, unspecified: Secondary | ICD-10-CM | POA: Diagnosis not present

## 2016-12-05 DIAGNOSIS — E1122 Type 2 diabetes mellitus with diabetic chronic kidney disease: Secondary | ICD-10-CM | POA: Diagnosis not present

## 2016-12-08 DIAGNOSIS — Z950 Presence of cardiac pacemaker: Secondary | ICD-10-CM | POA: Diagnosis not present

## 2016-12-08 DIAGNOSIS — E1122 Type 2 diabetes mellitus with diabetic chronic kidney disease: Secondary | ICD-10-CM | POA: Diagnosis not present

## 2016-12-08 DIAGNOSIS — I5043 Acute on chronic combined systolic (congestive) and diastolic (congestive) heart failure: Secondary | ICD-10-CM | POA: Diagnosis not present

## 2016-12-08 DIAGNOSIS — I482 Chronic atrial fibrillation: Secondary | ICD-10-CM | POA: Diagnosis not present

## 2016-12-08 DIAGNOSIS — J9620 Acute and chronic respiratory failure, unspecified whether with hypoxia or hypercapnia: Secondary | ICD-10-CM | POA: Diagnosis not present

## 2016-12-08 DIAGNOSIS — I130 Hypertensive heart and chronic kidney disease with heart failure and stage 1 through stage 4 chronic kidney disease, or unspecified chronic kidney disease: Secondary | ICD-10-CM | POA: Diagnosis not present

## 2016-12-08 DIAGNOSIS — N183 Chronic kidney disease, stage 3 (moderate): Secondary | ICD-10-CM | POA: Diagnosis not present

## 2016-12-08 DIAGNOSIS — D638 Anemia in other chronic diseases classified elsewhere: Secondary | ICD-10-CM | POA: Diagnosis not present

## 2016-12-08 DIAGNOSIS — J628 Pneumoconiosis due to other dust containing silica: Secondary | ICD-10-CM | POA: Diagnosis not present

## 2016-12-08 DIAGNOSIS — I255 Ischemic cardiomyopathy: Secondary | ICD-10-CM | POA: Diagnosis not present

## 2016-12-08 DIAGNOSIS — J449 Chronic obstructive pulmonary disease, unspecified: Secondary | ICD-10-CM | POA: Diagnosis not present

## 2016-12-08 DIAGNOSIS — Z9981 Dependence on supplemental oxygen: Secondary | ICD-10-CM | POA: Diagnosis not present

## 2016-12-08 DIAGNOSIS — E871 Hypo-osmolality and hyponatremia: Secondary | ICD-10-CM | POA: Diagnosis not present

## 2016-12-09 DIAGNOSIS — J449 Chronic obstructive pulmonary disease, unspecified: Secondary | ICD-10-CM | POA: Diagnosis not present

## 2016-12-09 DIAGNOSIS — E1122 Type 2 diabetes mellitus with diabetic chronic kidney disease: Secondary | ICD-10-CM | POA: Diagnosis not present

## 2016-12-09 DIAGNOSIS — I482 Chronic atrial fibrillation: Secondary | ICD-10-CM | POA: Diagnosis not present

## 2016-12-09 DIAGNOSIS — I5043 Acute on chronic combined systolic (congestive) and diastolic (congestive) heart failure: Secondary | ICD-10-CM | POA: Diagnosis not present

## 2016-12-09 DIAGNOSIS — Z9981 Dependence on supplemental oxygen: Secondary | ICD-10-CM | POA: Diagnosis not present

## 2016-12-09 DIAGNOSIS — N183 Chronic kidney disease, stage 3 (moderate): Secondary | ICD-10-CM | POA: Diagnosis not present

## 2016-12-09 DIAGNOSIS — J628 Pneumoconiosis due to other dust containing silica: Secondary | ICD-10-CM | POA: Diagnosis not present

## 2016-12-09 DIAGNOSIS — I255 Ischemic cardiomyopathy: Secondary | ICD-10-CM | POA: Diagnosis not present

## 2016-12-09 DIAGNOSIS — I130 Hypertensive heart and chronic kidney disease with heart failure and stage 1 through stage 4 chronic kidney disease, or unspecified chronic kidney disease: Secondary | ICD-10-CM | POA: Diagnosis not present

## 2016-12-09 DIAGNOSIS — E871 Hypo-osmolality and hyponatremia: Secondary | ICD-10-CM | POA: Diagnosis not present

## 2016-12-09 DIAGNOSIS — D638 Anemia in other chronic diseases classified elsewhere: Secondary | ICD-10-CM | POA: Diagnosis not present

## 2016-12-09 DIAGNOSIS — J9620 Acute and chronic respiratory failure, unspecified whether with hypoxia or hypercapnia: Secondary | ICD-10-CM | POA: Diagnosis not present

## 2016-12-09 DIAGNOSIS — Z950 Presence of cardiac pacemaker: Secondary | ICD-10-CM | POA: Diagnosis not present

## 2016-12-10 DIAGNOSIS — D638 Anemia in other chronic diseases classified elsewhere: Secondary | ICD-10-CM | POA: Diagnosis not present

## 2016-12-10 DIAGNOSIS — I255 Ischemic cardiomyopathy: Secondary | ICD-10-CM | POA: Diagnosis not present

## 2016-12-10 DIAGNOSIS — J9620 Acute and chronic respiratory failure, unspecified whether with hypoxia or hypercapnia: Secondary | ICD-10-CM | POA: Diagnosis not present

## 2016-12-10 DIAGNOSIS — N183 Chronic kidney disease, stage 3 (moderate): Secondary | ICD-10-CM | POA: Diagnosis not present

## 2016-12-10 DIAGNOSIS — E871 Hypo-osmolality and hyponatremia: Secondary | ICD-10-CM | POA: Diagnosis not present

## 2016-12-10 DIAGNOSIS — I482 Chronic atrial fibrillation: Secondary | ICD-10-CM | POA: Diagnosis not present

## 2016-12-10 DIAGNOSIS — Z9981 Dependence on supplemental oxygen: Secondary | ICD-10-CM | POA: Diagnosis not present

## 2016-12-10 DIAGNOSIS — Z950 Presence of cardiac pacemaker: Secondary | ICD-10-CM | POA: Diagnosis not present

## 2016-12-10 DIAGNOSIS — J449 Chronic obstructive pulmonary disease, unspecified: Secondary | ICD-10-CM | POA: Diagnosis not present

## 2016-12-10 DIAGNOSIS — E1122 Type 2 diabetes mellitus with diabetic chronic kidney disease: Secondary | ICD-10-CM | POA: Diagnosis not present

## 2016-12-10 DIAGNOSIS — I130 Hypertensive heart and chronic kidney disease with heart failure and stage 1 through stage 4 chronic kidney disease, or unspecified chronic kidney disease: Secondary | ICD-10-CM | POA: Diagnosis not present

## 2016-12-10 DIAGNOSIS — J628 Pneumoconiosis due to other dust containing silica: Secondary | ICD-10-CM | POA: Diagnosis not present

## 2016-12-10 DIAGNOSIS — I5043 Acute on chronic combined systolic (congestive) and diastolic (congestive) heart failure: Secondary | ICD-10-CM | POA: Diagnosis not present

## 2016-12-11 DIAGNOSIS — D638 Anemia in other chronic diseases classified elsewhere: Secondary | ICD-10-CM | POA: Diagnosis not present

## 2016-12-11 DIAGNOSIS — I255 Ischemic cardiomyopathy: Secondary | ICD-10-CM | POA: Diagnosis not present

## 2016-12-11 DIAGNOSIS — E871 Hypo-osmolality and hyponatremia: Secondary | ICD-10-CM | POA: Diagnosis not present

## 2016-12-11 DIAGNOSIS — E1122 Type 2 diabetes mellitus with diabetic chronic kidney disease: Secondary | ICD-10-CM | POA: Diagnosis not present

## 2016-12-11 DIAGNOSIS — J449 Chronic obstructive pulmonary disease, unspecified: Secondary | ICD-10-CM | POA: Diagnosis not present

## 2016-12-11 DIAGNOSIS — Z9981 Dependence on supplemental oxygen: Secondary | ICD-10-CM | POA: Diagnosis not present

## 2016-12-11 DIAGNOSIS — Z950 Presence of cardiac pacemaker: Secondary | ICD-10-CM | POA: Diagnosis not present

## 2016-12-11 DIAGNOSIS — N183 Chronic kidney disease, stage 3 (moderate): Secondary | ICD-10-CM | POA: Diagnosis not present

## 2016-12-11 DIAGNOSIS — I130 Hypertensive heart and chronic kidney disease with heart failure and stage 1 through stage 4 chronic kidney disease, or unspecified chronic kidney disease: Secondary | ICD-10-CM | POA: Diagnosis not present

## 2016-12-11 DIAGNOSIS — J9620 Acute and chronic respiratory failure, unspecified whether with hypoxia or hypercapnia: Secondary | ICD-10-CM | POA: Diagnosis not present

## 2016-12-11 DIAGNOSIS — J628 Pneumoconiosis due to other dust containing silica: Secondary | ICD-10-CM | POA: Diagnosis not present

## 2016-12-11 DIAGNOSIS — I5043 Acute on chronic combined systolic (congestive) and diastolic (congestive) heart failure: Secondary | ICD-10-CM | POA: Diagnosis not present

## 2016-12-11 DIAGNOSIS — I482 Chronic atrial fibrillation: Secondary | ICD-10-CM | POA: Diagnosis not present

## 2016-12-13 DIAGNOSIS — E871 Hypo-osmolality and hyponatremia: Secondary | ICD-10-CM | POA: Diagnosis not present

## 2016-12-13 DIAGNOSIS — I5043 Acute on chronic combined systolic (congestive) and diastolic (congestive) heart failure: Secondary | ICD-10-CM | POA: Diagnosis not present

## 2016-12-13 DIAGNOSIS — N183 Chronic kidney disease, stage 3 (moderate): Secondary | ICD-10-CM | POA: Diagnosis not present

## 2016-12-13 DIAGNOSIS — Z950 Presence of cardiac pacemaker: Secondary | ICD-10-CM | POA: Diagnosis not present

## 2016-12-13 DIAGNOSIS — D638 Anemia in other chronic diseases classified elsewhere: Secondary | ICD-10-CM | POA: Diagnosis not present

## 2016-12-13 DIAGNOSIS — J9620 Acute and chronic respiratory failure, unspecified whether with hypoxia or hypercapnia: Secondary | ICD-10-CM | POA: Diagnosis not present

## 2016-12-13 DIAGNOSIS — Z9981 Dependence on supplemental oxygen: Secondary | ICD-10-CM | POA: Diagnosis not present

## 2016-12-13 DIAGNOSIS — J628 Pneumoconiosis due to other dust containing silica: Secondary | ICD-10-CM | POA: Diagnosis not present

## 2016-12-13 DIAGNOSIS — I255 Ischemic cardiomyopathy: Secondary | ICD-10-CM | POA: Diagnosis not present

## 2016-12-13 DIAGNOSIS — E1122 Type 2 diabetes mellitus with diabetic chronic kidney disease: Secondary | ICD-10-CM | POA: Diagnosis not present

## 2016-12-13 DIAGNOSIS — I130 Hypertensive heart and chronic kidney disease with heart failure and stage 1 through stage 4 chronic kidney disease, or unspecified chronic kidney disease: Secondary | ICD-10-CM | POA: Diagnosis not present

## 2016-12-13 DIAGNOSIS — I482 Chronic atrial fibrillation: Secondary | ICD-10-CM | POA: Diagnosis not present

## 2016-12-13 DIAGNOSIS — J449 Chronic obstructive pulmonary disease, unspecified: Secondary | ICD-10-CM | POA: Diagnosis not present

## 2016-12-13 NOTE — Progress Notes (Deleted)
Cardiology Office Note   Date:  12/13/2016   ID:  [name], DOB [birth year], MRN [id number]  PCP:  [name], MD  Cardiologist:   Minus Breeding, MD    No chief complaint on file.     History of Present Illness: [name] is a 83 y.o. male who presents for follow up of systolic HF (EF 87%)  and CAD status post CABG, atrial fib and history of CRT.  He was in Jan for respiratory failure.  He had exacerbation of COPD and CHF.  Of note during his recent hospitalization he did have a palliative care consult and was made a DNR.  However he did not want hospice services.  He was in the hospital in August with syncope thought to be related to over diuresis. I reviewed these records for this visit.  EF in August was 15 - 20%.   ***     He has done relatively OK.  At home he gets around with a walker and holds onto things.  Today he is very somnolent. His blood pressure is low. He's not weighing himself every day. He's had some physical therapy. There's been only sporadic nurses coming to the house. He drinks about 40 ounces of water and also coffee at home. He's not had any acute shortness of breath, PND or orthopnea. He's not had any palpitations, presyncope or syncope. He's not had any chest pressure, neck or arm discomfort.  He wears oxygen but actually didn't bring it today.   Past Medical History:  Diagnosis Date  . Aortic stenosis   . Arthritis   . Black lung disease ([place])   . CAD (coronary artery disease)    a. 07/2002 CABG x 3: LIMA->LAD, VG->Diag, VG->OM;  b. 06/2006 Cath: LM 50-60ost/p, LAD patent mid stent, D1 sev dzs, D2 patent stent, LCX nl, OM2 sev sten prox, RCA large/nl, VG->Diag nl, VG->OM nl, LIMA->LAD atretic, EF 30%.  . Carotid artery occlusion   . Chronic atrial fibrillation ([place])   . Chronic respiratory failure ([place])   . Chronic systolic CHF (congestive heart failure) ([place])    a. 12/2012 Echo: EF 20-25%, mid-dist antsept AK, mod dil LA. b. Echo 01/2016: EF  10-15%, also severe RV dysfunction, mild AS (may be underrepresented).  . COPD (chronic obstructive pulmonary disease) ([place])    a. On home O2.  [name] CVA (cerebral vascular accident) ([place])   . Diabetes mellitus   . GERD (gastroesophageal reflux disease)   . GI bleed 01/2016  . Gout   . Hypercholesterolemia   . Hypertension   . Ischemia of lower extremity    a. s/p thrombectomy 01/2016.  . Ischemic cardiomyopathy    a. 06/2006  BIV-ICD -> gen change 2015 to [place].  . Lung cancer ([place])   . Nephrolithiasis 09/2000  . On home oxygen therapy    "2.5L; 24/7" (07/25/2016)  . Prostate cancer ([place]) 11/01/10   gleason 7, 8, 9, gold seeds 02/08/11  . Symptomatic bradycardia     Past Surgical History:  Procedure Laterality Date  . BI-VENTRICULAR PACEMAKER INSERTION (CRT-P)  12-02-13   downgrade of previously implanted CRTD to STJ CRTP  . BIV PACEMAKER GENERATOR CHANGE OUT N/A 12/02/2013   Procedure: BIV PACEMAKER GENERATOR CHANGE OUT;  Surgeon: Evans Lance, MD;  Location: [facility] CATH LAB;  Service: Cardiovascular;  Laterality: N/A;  . CATARACT EXTRACTION W/ INTRAOCULAR LENS  IMPLANT, BILATERAL Bilateral   . COLONOSCOPY    . CORONARY  ANGIOPLASTY WITH STENT PLACEMENT  07/1997; 08/1997;03/1998  . CORONARY ARTERY BYPASS GRAFT  07/2002   CABG X3  . ESOPHAGOGASTRODUODENOSCOPY  02/19/2011   Procedure: ESOPHAGOGASTRODUODENOSCOPY (EGD);  Surgeon: Jeryl Columbia, MD;  Location[facility] ENDOSCOPY;  Service: Endoscopy;  Laterality: N/A;  . ESOPHAGOGASTRODUODENOSCOPY    . FEMORAL-POPLITEAL BYPASS GRAFT Right 01/20/2016   Procedure: THROMBECTOMY OF POPLITEAL ARTERY AND ANTERIOR TIBIAL ARTERY RIGHT LEG; INTRAOPERATIVE ARTERIOGRAM;  Surgeon: Waynetta Sandy, MD;  Location: [place] [facility];  Service: Vascular;  Laterality: Right;  . INCISION AND DRAINAGE OF WOUND  08/2002   right thigh; S/P EVH  . INSERT / REPLACE / [place]; 1992; 01/2000;  . INSERT / REPLACE / REMOVE PACEMAKER  09/2003; 06/2006   w/AICD   . INSERT / REPLACE / REMOVE PACEMAKER  12/2004   pacmaker explant  . SHOULDER ARTHROSCOPY W/ ROTATOR CUFF REPAIR  05/2008   left     Current Outpatient Prescriptions  Medication Sig Dispense Refill  . albuterol (PROVENTIL HFA;VENTOLIN HFA) 108 (90 Base) MCG/ACT inhaler Inhale 2 puffs into the lungs every 4 (four) hours as needed for wheezing or shortness of breath. 1 Inhaler 2  . allopurinol (ZYLOPRIM) 300 MG tablet Take 300 mg by mouth daily.      [name] apixaban (ELIQUIS) 2.5 MG TABS tablet Take 1 tablet (2.5 mg total) by mouth 2 (two) times daily. 60 tablet 0  . CALCIUM-VITAMIN D PO Take 1 tablet by mouth 2 (two) times daily.     . feeding supplement, ENSURE ENLIVE, (ENSURE ENLIVE) LIQD Take 237 mLs by mouth 2 (two) times daily between meals. (Patient not taking: Reported on 11/07/2016) 237 mL 12  . furosemide (LASIX) 20 MG tablet Take 1 tablet (20 mg total) by mouth every other day. 30 tablet 0  . hydrocortisone cream 1 % Apply 1 application topically 2 (two) times daily as needed for itching. (Patient not taking: Reported on 09/04/2016)    . isosorbide dinitrate (ISORDIL) 5 MG tablet Take 0.5 tablets (2.5 mg total) by mouth 3 (three) times daily. 30 tablet 0  . midodrine (PROAMATINE) 10 MG tablet Take 1 tablet (10 mg total) by mouth 3 (three) times daily with meals. 90 tablet 0  . nitroGLYCERIN (NITROSTAT) 0.4 MG SL tablet Place 0.4 mg under the tongue every 5 (five) minutes as needed for chest pain.    [name] omeprazole (PRILOSEC) 40 MG capsule Take 1 capsule (40 mg total) by mouth 2 (two) times daily. (Patient taking differently: Take 20 mg by mouth 2 (two) times daily. ) 60 capsule 1  . ondansetron (ZOFRAN ODT) 4 MG disintegrating tablet Take 1 tablet (4 mg total) by mouth every 8 (eight) hours as needed for nausea or vomiting. (Patient not taking: Reported on 09/04/2016) 12 tablet 0  . OXYGEN Inhale 2.5 L into the lungs continuous.    . tamsulosin (FLOMAX) 0.4 MG CAPS capsule Take 0.4 mg by mouth  at bedtime.  1  . vitamin B-12 (CYANOCOBALAMIN) 1000 MCG tablet Take 1,000 mcg by mouth 2 (two) times daily.     No current facility-administered medications for this visit.     Allergies:   [place] [digoxin]; Aspirin; and Penicillins    ROS:  Please see the history of present illness.   Otherwise, review of systems are positive for ***.   All other systems are reviewed and negative.    PHYSICAL EXAM: VS:  There were no vitals taken for this visit. , BMI There is no height or  weight on file to calculate BMI.  GEN:  No distress, frail  NECK:  No jugular venous distention at 90 degrees, waveform within normal limits, carotid upstroke brisk and symmetric, no bruits, no thyromegaly LYMPHATICS:  No cervical adenopathy LUNGS:  Clear to auscultation bilaterally BACK:  No CVA tenderness CHEST:  Unremarkable HEART:  S1 and S2 within normal limits, no S3, no S4, no clicks, no rubs, *** murmurs ABD:  Positive bowel sounds normal in frequency in pitch, no bruits, no rebound, no guarding, unable to assess midline mass or bruit with the patient seated. EXT:  2 plus pulses throughout, moderate edema, no cyanosis no clubbing SKIN:  No rashes no nodules NEURO:  Cranial nerves II through XII grossly intact, motor grossly intact throughout PSYCH:  Cognitively intact, oriented to person place and time   GEN:  No distress, very frail NECK:  No jugular venous distention at 90 degrees, waveform within normal limits, carotid upstroke brisk and symmetric, no bruits, no thyromegaly LYMPHATICS:  No cervical adenopathy LUNGS:  Clear to auscultation bilaterally BACK:  No CVA tenderness CHEST:  Unremarkable HEART:  S1 and S2 within normal limits, no S3, no S4, no clicks, no rubs, no murmurs ABD:  Positive bowel sounds normal in frequency in pitch, no bruits, no rebound, no guarding, unable to assess midline mass or bruit with the patient seated. EXT:  2 plus pulses throughout, moderate edema, no cyanosis no  clubbing SKIN:  No rashes no nodules NEURO:  Cranial nerves II through XII grossly intact, motor grossly intact throughout PSYCH:  Cognitively intact, oriented to person place and time   EKG:  EKG is not *** ordered today. ***  Recent Labs: 01/19/2016: Magnesium 2.2 08/10/2016: ALT 24 11/07/2016: B Natriuretic Peptide 1,619.3 11/09/2016: BUN 21; Creatinine, Ser 1.14; Hemoglobin 8.2; Platelets 134; Potassium 3.9; Sodium 131    Lipid Panel    Component Value Date/Time   CHOL 159 10/27/2015 0515   TRIG 245 (H) 10/27/2015 0515   HDL 18 (L) 10/27/2015 0515   CHOLHDL 8.8 10/27/2015 0515   VLDL 49 (H) 10/27/2015 0515   LDLCALC 92 10/27/2015 0515      Wt Readings from Last 3 Encounters:  11/09/16 119 lb 11.2 oz (54.3 kg)  09/17/16 128 lb (58.1 kg)  09/10/16 124 lb (56.2 kg)      Other studies Reviewed: Additional studies/ records that were reviewed today include:   ***  Review of the above records demonstrates:  ***   ASSESSMENT AND PLAN:  ACUTE ON CHRONIC SYSTOLIC AND DIASTOLIC HF:  ***   I had a discussion with his daughter and I'm going to get hospice nurses into the house. He was very hypotensive today. His blood pressure dropped into the 70s and he became somnolent while I was talking to him. I laid him down for his feet up. He responded quickly blood pressure came up to 92. His oxygen saturation was fine. He was never in distress. We will stop his spironolactone and Cozaar for now but unlikely to restart these in the future. However, he needs frequent nursing visits and blood pressure checks and it would be nice to have some home blood work provide him the opportunity to stay out all the hospital.  CKD:   ***   I will check a basic metabolic profile today.  HTN:  ***  This is being managed in the context of treating his CHF  HYPONATREMIA:    ***   Current medicines are reviewed at length  with the patient today.  The patient does not have concerns regarding  medicines.  The following changes have been made:   ***  Labs/ tests ordered today include:  ***  No orders of the defined types were placed in this encounter.    Disposition:   FU with me in *** months.     Signed, Minus Breeding, MD  12/13/2016 9:18 PM    [facility]

## 2016-12-14 ENCOUNTER — Ambulatory Visit: Payer: Medicare Other | Admitting: Cardiology

## 2016-12-17 DIAGNOSIS — R42 Dizziness and giddiness: Secondary | ICD-10-CM | POA: Diagnosis not present

## 2016-12-17 DIAGNOSIS — J449 Chronic obstructive pulmonary disease, unspecified: Secondary | ICD-10-CM | POA: Diagnosis not present

## 2016-12-17 DIAGNOSIS — K219 Gastro-esophageal reflux disease without esophagitis: Secondary | ICD-10-CM | POA: Diagnosis not present

## 2016-12-17 DIAGNOSIS — C61 Malignant neoplasm of prostate: Secondary | ICD-10-CM | POA: Diagnosis not present

## 2016-12-17 DIAGNOSIS — Z8673 Personal history of transient ischemic attack (TIA), and cerebral infarction without residual deficits: Secondary | ICD-10-CM | POA: Diagnosis not present

## 2017-01-14 ENCOUNTER — Ambulatory Visit (INDEPENDENT_AMBULATORY_CARE_PROVIDER_SITE_OTHER): Payer: Medicare Other | Admitting: *Deleted

## 2017-01-14 DIAGNOSIS — I255 Ischemic cardiomyopathy: Secondary | ICD-10-CM

## 2017-01-14 NOTE — Progress Notes (Signed)
Remote pacemaker transmission.   

## 2017-01-16 ENCOUNTER — Encounter: Payer: Self-pay | Admitting: Cardiology

## 2017-01-18 DIAGNOSIS — J449 Chronic obstructive pulmonary disease, unspecified: Secondary | ICD-10-CM | POA: Diagnosis not present

## 2017-01-18 DIAGNOSIS — R42 Dizziness and giddiness: Secondary | ICD-10-CM | POA: Diagnosis not present

## 2017-01-18 DIAGNOSIS — I4891 Unspecified atrial fibrillation: Secondary | ICD-10-CM | POA: Diagnosis not present

## 2017-01-18 DIAGNOSIS — I10 Essential (primary) hypertension: Secondary | ICD-10-CM | POA: Diagnosis not present

## 2017-01-18 DIAGNOSIS — I509 Heart failure, unspecified: Secondary | ICD-10-CM | POA: Diagnosis not present

## 2017-01-29 LAB — CUP PACEART REMOTE DEVICE CHECK
Battery Remaining Longevity: 60 mo
Battery Remaining Percentage: 89 %
Date Time Interrogation Session: 20181105060014
Implantable Lead Implant Date: 20050706
Implantable Lead Location: 753860
Implantable Pulse Generator Implant Date: 20150923
Lead Channel Impedance Value: 630 Ohm
Lead Channel Pacing Threshold Amplitude: 2.625 V
Lead Channel Pacing Threshold Pulse Width: 0.8 ms
Lead Channel Sensing Intrinsic Amplitude: 5.9 mV
Lead Channel Setting Pacing Amplitude: 2.5 V
Lead Channel Setting Sensing Sensitivity: 2 mV
MDC IDC LEAD IMPLANT DT: 20080423
MDC IDC LEAD LOCATION: 753858
MDC IDC MSMT BATTERY VOLTAGE: 2.95 V
MDC IDC MSMT LEADCHNL RV IMPEDANCE VALUE: 540 Ohm
MDC IDC MSMT LEADCHNL RV PACING THRESHOLD AMPLITUDE: 0.5 V
MDC IDC MSMT LEADCHNL RV PACING THRESHOLD PULSEWIDTH: 0.5 ms
MDC IDC SET LEADCHNL LV PACING AMPLITUDE: 3.625
MDC IDC SET LEADCHNL LV PACING PULSEWIDTH: 0.8 ms
MDC IDC SET LEADCHNL RV PACING PULSEWIDTH: 0.5 ms
Pulse Gen Model: 3222
Pulse Gen Serial Number: 7634311

## 2017-02-03 DIAGNOSIS — J449 Chronic obstructive pulmonary disease, unspecified: Secondary | ICD-10-CM | POA: Diagnosis not present

## 2017-02-25 DIAGNOSIS — I509 Heart failure, unspecified: Secondary | ICD-10-CM | POA: Diagnosis not present

## 2017-02-25 DIAGNOSIS — J449 Chronic obstructive pulmonary disease, unspecified: Secondary | ICD-10-CM | POA: Diagnosis not present

## 2017-02-25 DIAGNOSIS — I10 Essential (primary) hypertension: Secondary | ICD-10-CM | POA: Diagnosis not present

## 2017-02-25 DIAGNOSIS — Z8673 Personal history of transient ischemic attack (TIA), and cerebral infarction without residual deficits: Secondary | ICD-10-CM | POA: Diagnosis not present

## 2017-02-25 DIAGNOSIS — R42 Dizziness and giddiness: Secondary | ICD-10-CM | POA: Diagnosis not present

## 2017-04-12 DEATH — deceased

## 2017-04-15 ENCOUNTER — Encounter: Payer: Medicaid Other | Admitting: *Deleted

## 2017-04-15 ENCOUNTER — Telehealth: Payer: Self-pay | Admitting: Cardiology

## 2017-04-15 NOTE — Telephone Encounter (Signed)
LMOVM reminding pt to send remote transmission.   

## 2017-04-17 ENCOUNTER — Encounter: Payer: Self-pay | Admitting: Cardiology

## 2017-05-26 IMAGING — CR DG CHEST 2V
2 series · 2 of 2 positions shown · non-contrast
Comparison: Chest radiograph performed 06/10/2015

CLINICAL DATA: Acute onset of generalized chest pain. Initial
encounter.

EXAM:
CHEST  2 VIEW

[chest pa]
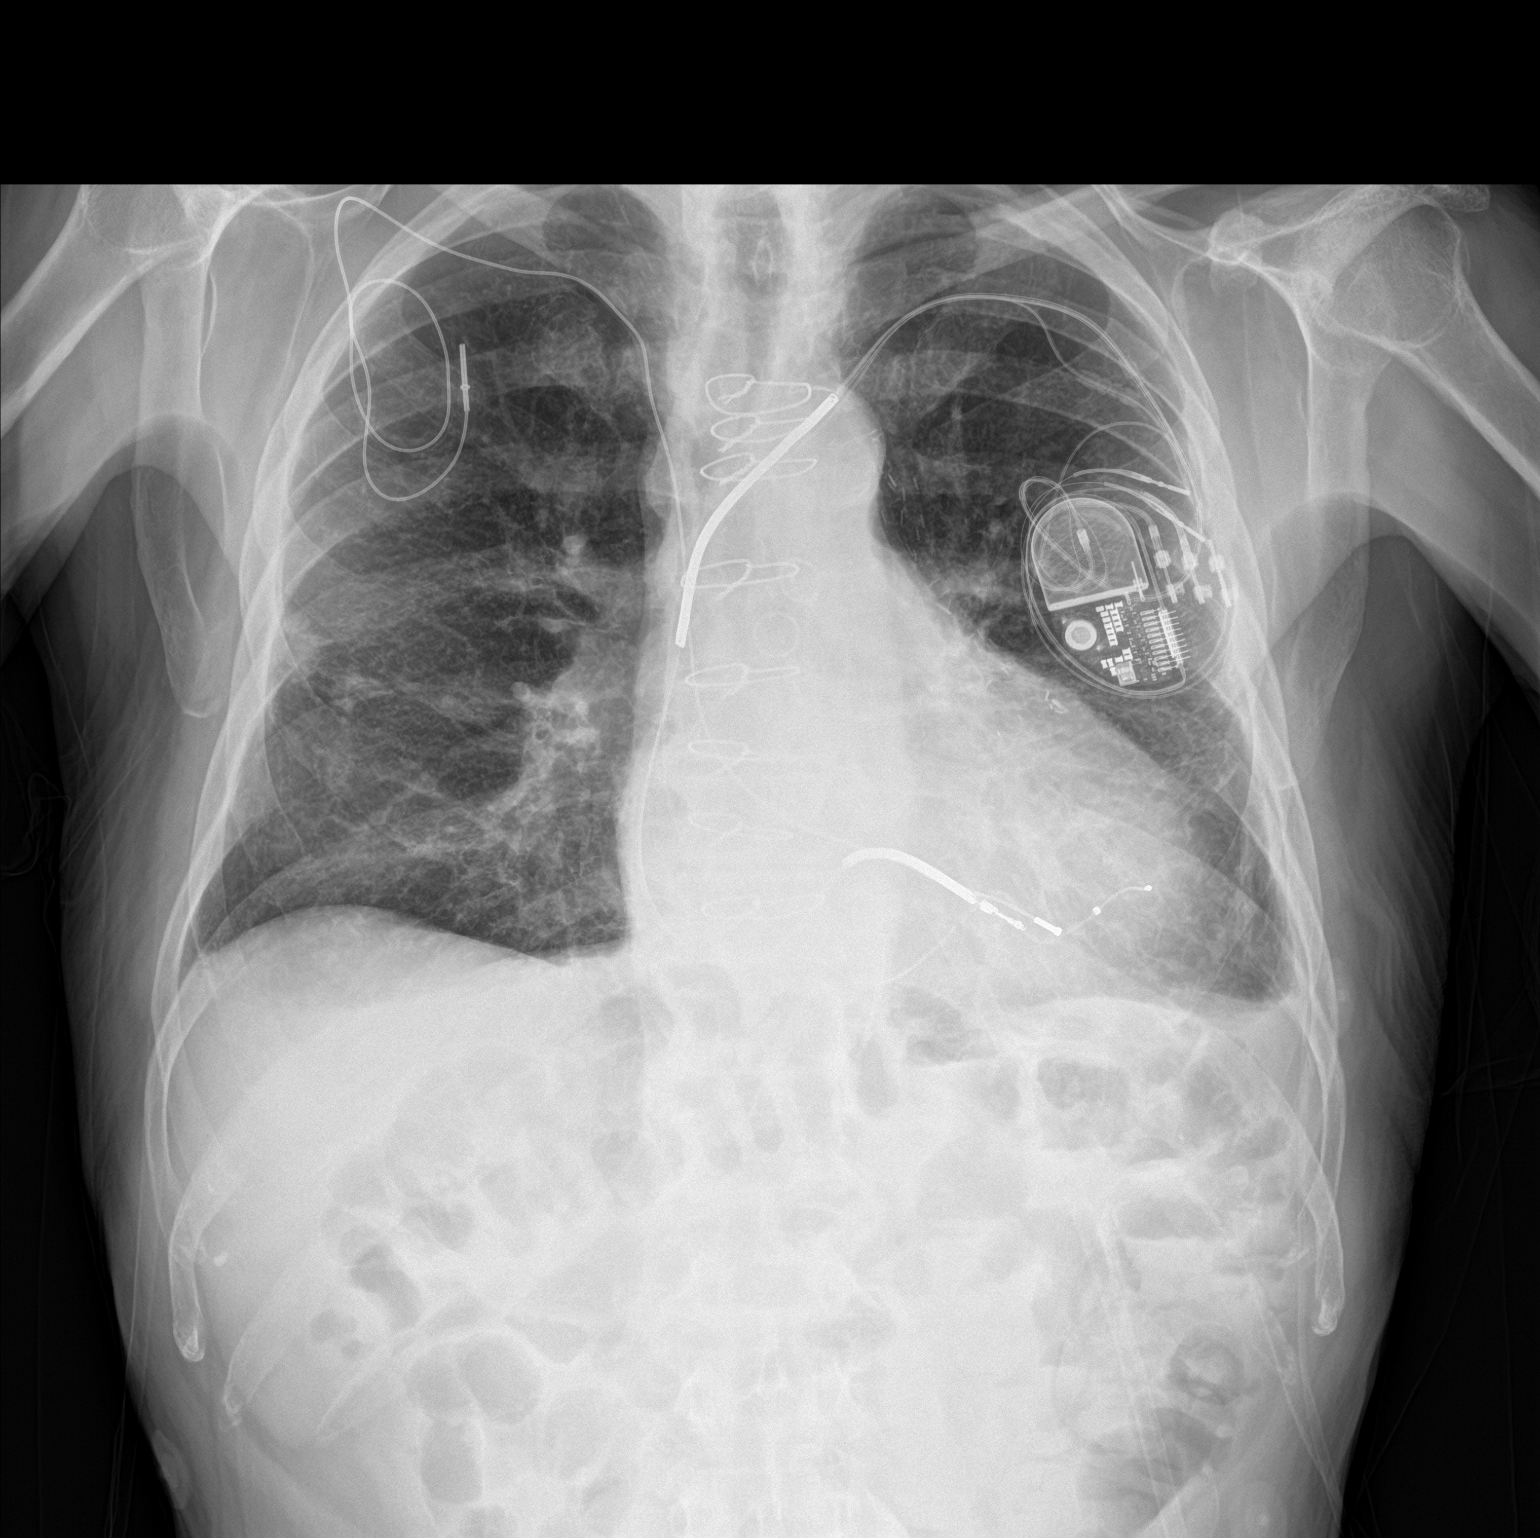

[chest lat]
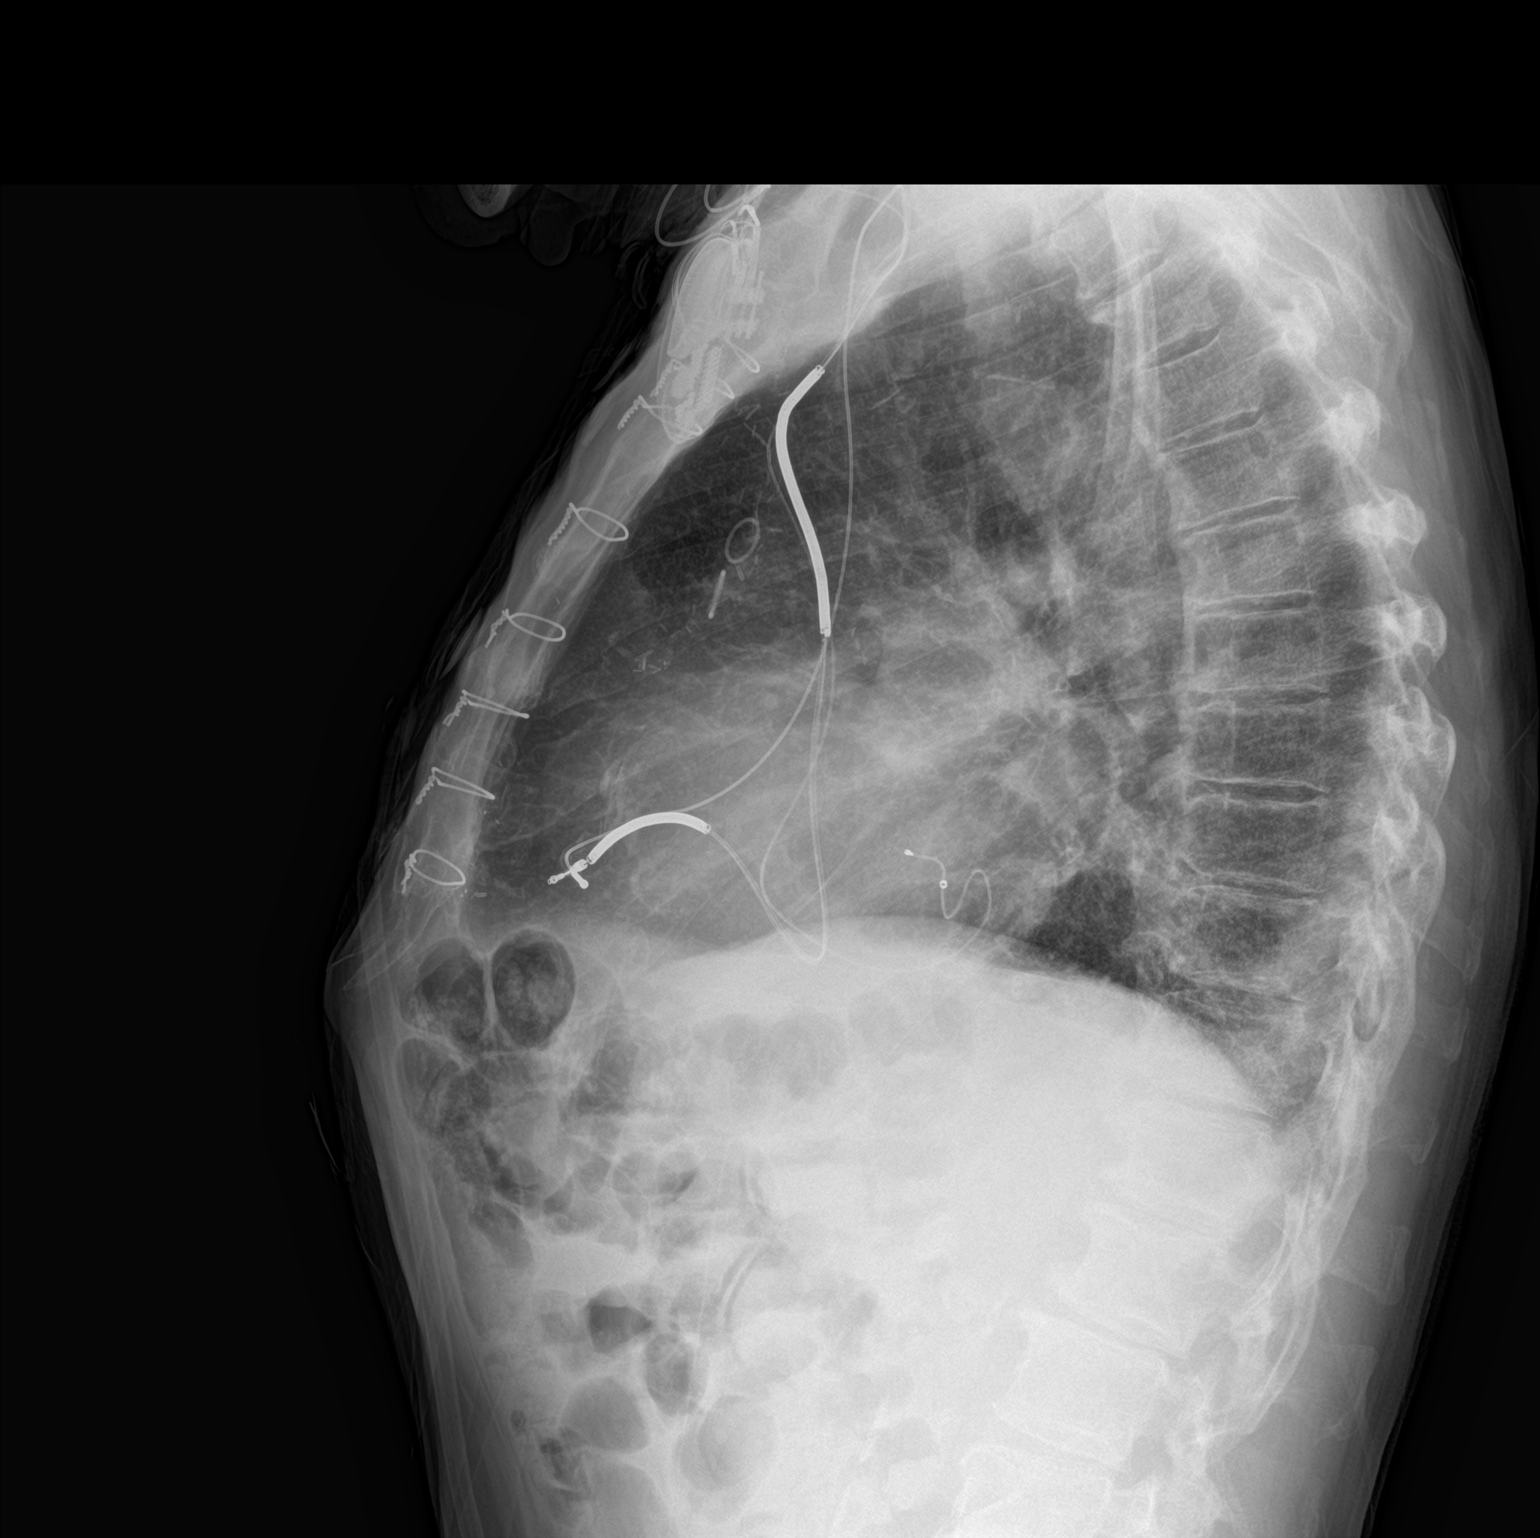

[2 of 2 positions shown; findings below may reference images not displayed]

FINDINGS: A small left pleural effusion is noted. Vascular congestion is
noted. Chronically increased interstitial lung markings may reflect
mild interstitial lung disease. No pneumothorax is seen.

The heart is normal in size. The patient is status post median
sternotomy, with evidence of prior CABG. A pacemaker/AICD is noted
overlying the left chest wall, with leads ending overlying the right
ventricle and coronary sinus. An orphaned lead is noted overlying
the right chest wall.

No acute osseous abnormalities are seen.
IMPRESSION: Small left pleural effusion noted. Vascular congestion seen.
Chronically increased interstitial markings may reflect mild
interstitial lung disease.

## 2017-08-16 IMAGING — DX DG CHEST 2V
2 series · 2 of 2 positions shown · non-contrast
Comparison: 10/26/2015.

CLINICAL DATA: Central and left chest pain for the past 2 days.
Ex-smoker.

EXAM:
CHEST  2 VIEW

[w chest pa]
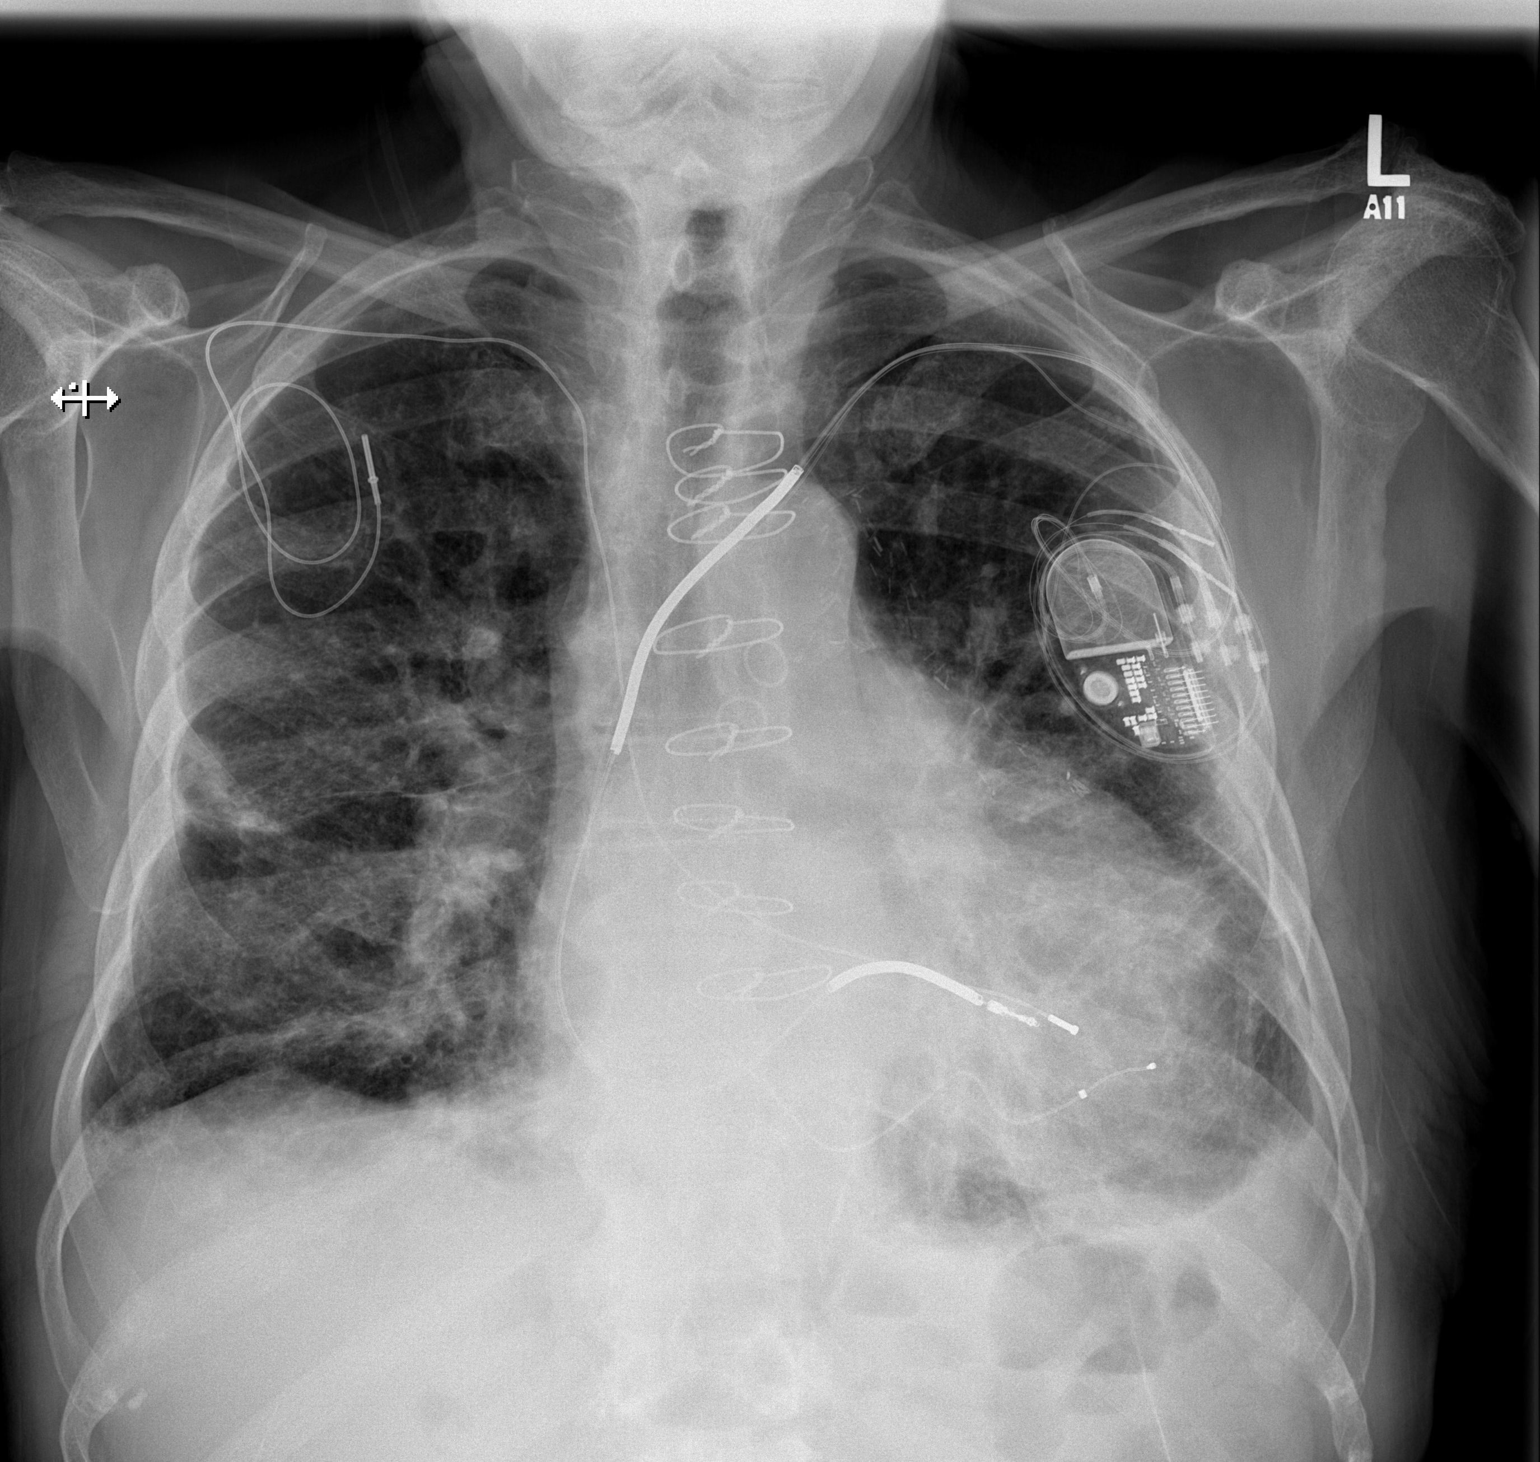

[w chest lat]
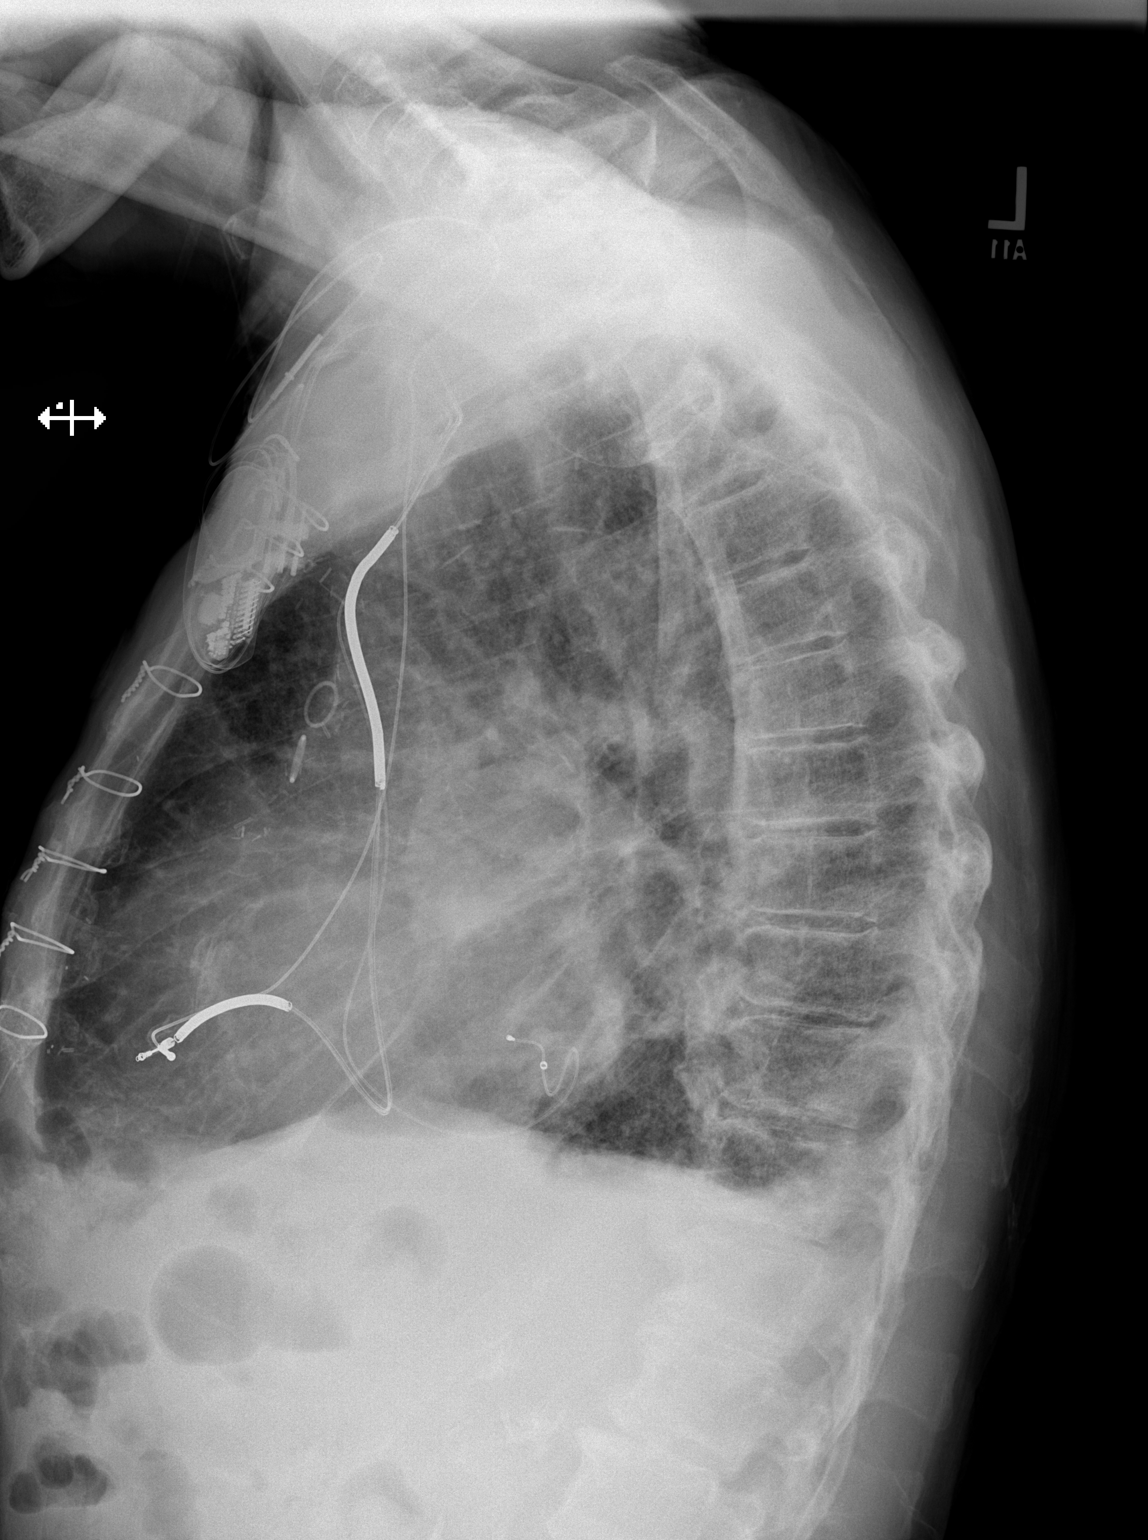

[2 of 2 positions shown; findings below may reference images not displayed]

FINDINGS: Mildly progressive enlargement of the cardiac silhouette. Stable
post CABG changes and left subclavian AICD and pacemaker leads. A
right subclavian pacemaker lead is also unchanged. Prominence of the
interstitial markings and right mid lung zone scarring are
unchanged. Small left pleural effusion without significant change.
Thoracolumbar spine degenerative changes.
IMPRESSION: 1. Mildly progressive cardiomegaly.
2. Stable small left pleural effusion.
3. Stable interstitial fibrosis and right lung scarring.

## 2017-08-17 IMAGING — CT CT ABD-PELV W/ CM
2 of 5 series · 15 of 46 positions shown, 17 images · IV contrast (APPLIED)
Comparison: 11/10/2014

CLINICAL DATA: Colon cancer.  History of gallstones and hematuria.

EXAM:
CT ABDOMEN AND PELVIS WITH CONTRAST
TECHNIQUE: Multidetector CT imaging of the abdomen and pelvis was performed
using the standard protocol following bolus administration of
intravenous contrast.
CONTRAST:  80mL [id number] IOPAMIDOL ([id number]) INJECTION 61%

[Series 2: abd/ pelvis 5.0 i30f 1 · axial · 0.72mm/px · z∈[+547,+932]mm · 12 of 87 slices shown, 14 images]
[im 5/87  soft-tissue]
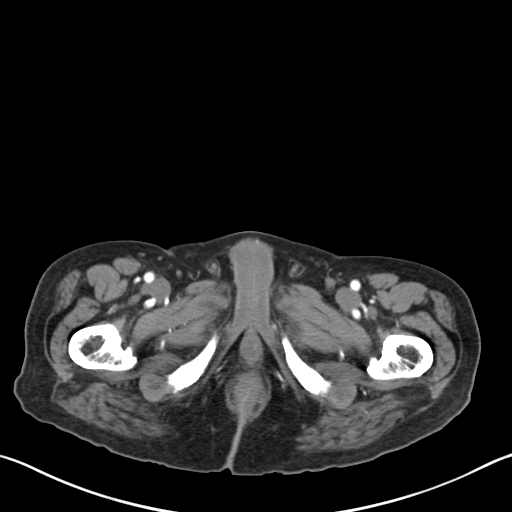
[im 5/87  bone]
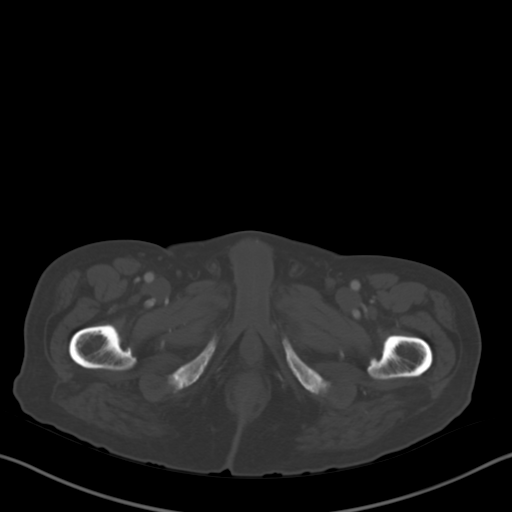
[im 15/87  soft-tissue]
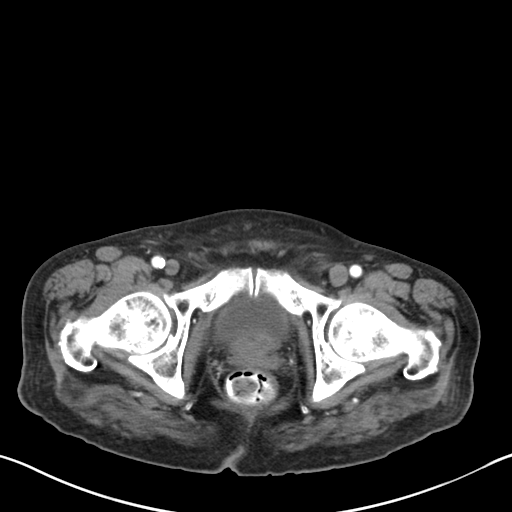
[im 20/87  soft-tissue]
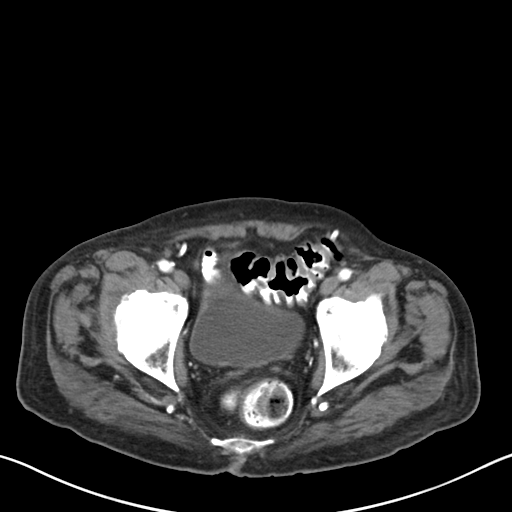
[im 24/87  soft-tissue]
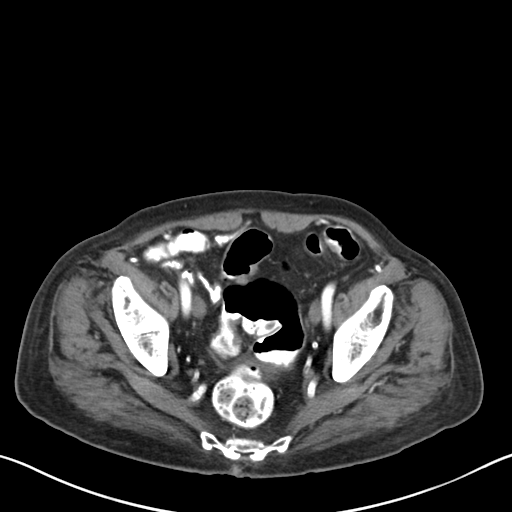
[im 34/87  soft-tissue]
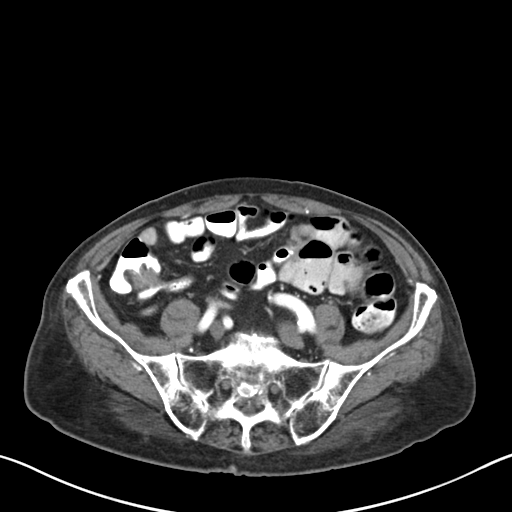
[im 39/87  soft-tissue]
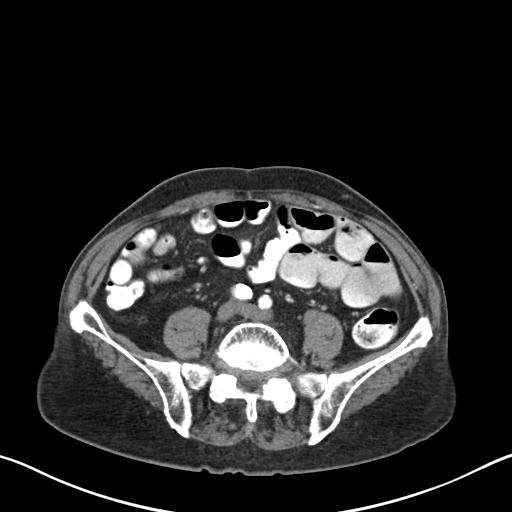
[im 48/87  soft-tissue]
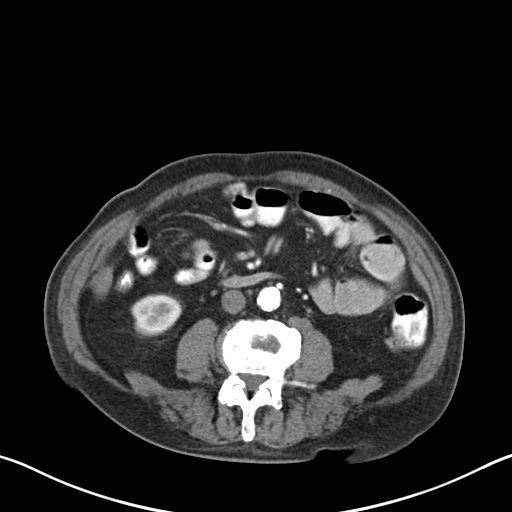
[im 53/87  soft-tissue]
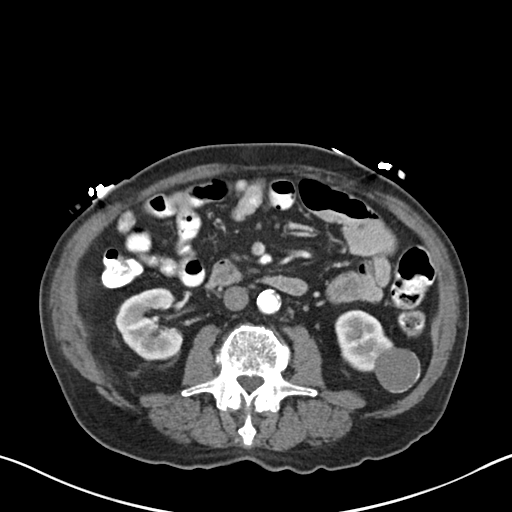
[im 63/87  soft-tissue]
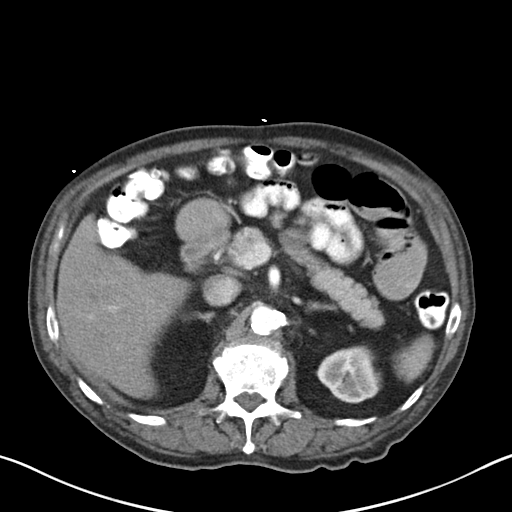
[im 63/87  bone]
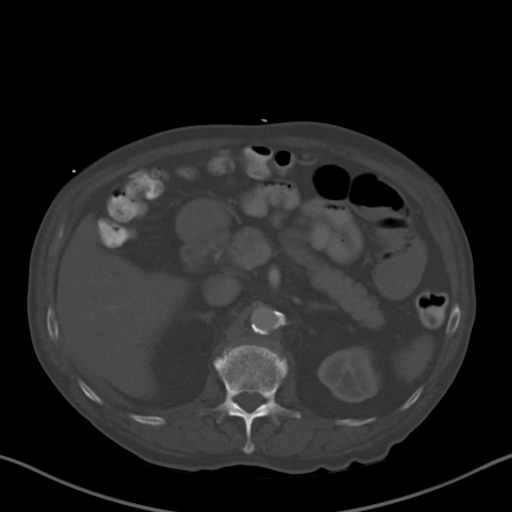
[im 67/87  soft-tissue]
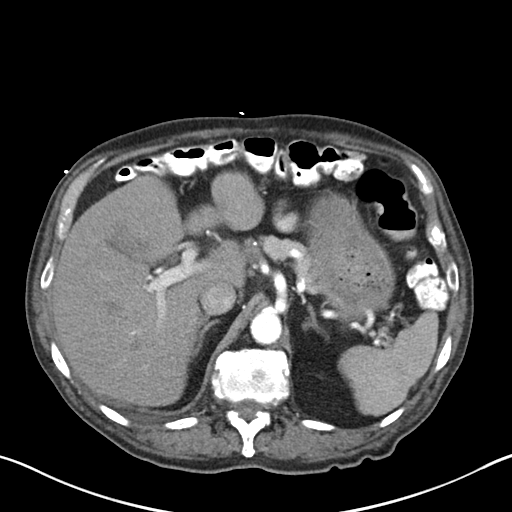
[im 72/87  soft-tissue]
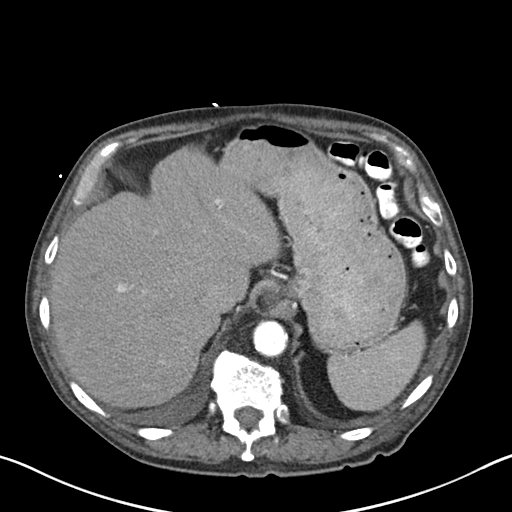
[im 82/87  soft-tissue]
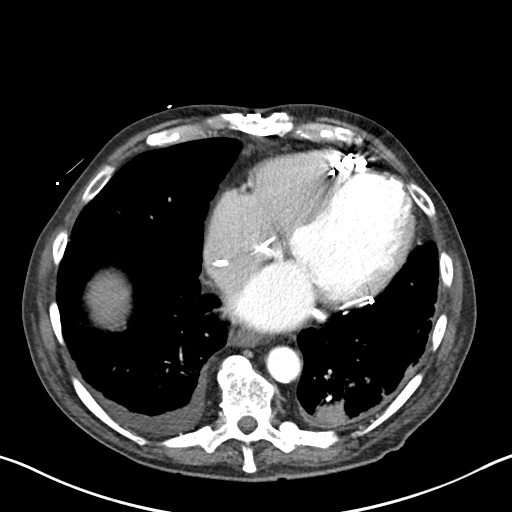

[Series 5: coronal soft tissue · coronal · 0.63mm/px · 3 of 92 slices shown]
[im 31/92  soft-tissue]
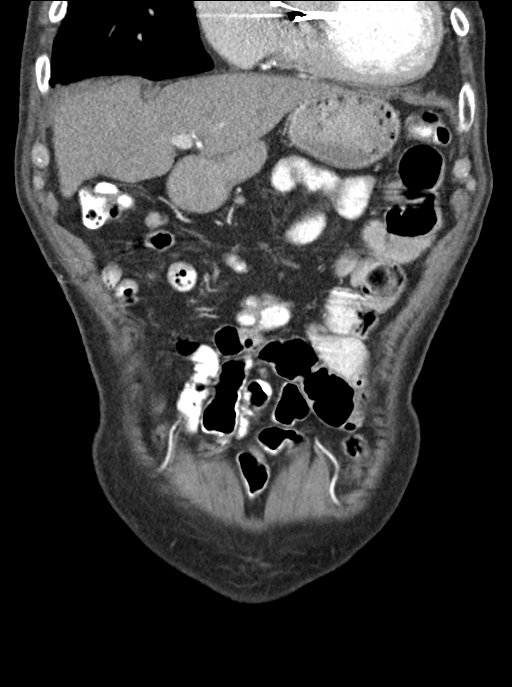
[im 41/92  soft-tissue]
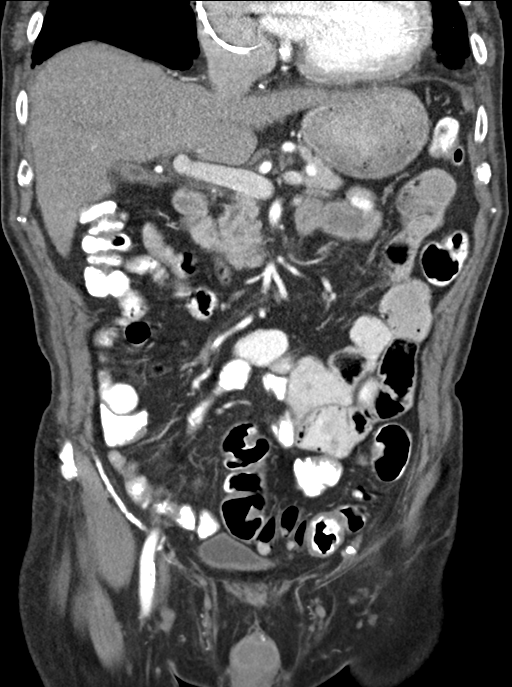
[im 51/92  soft-tissue]
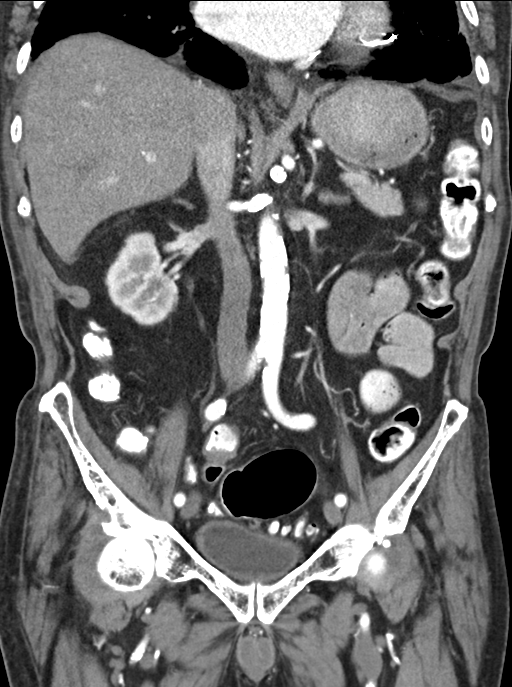

[15 of 46 positions shown; findings below may reference images not displayed]

FINDINGS: Lower chest: Small right greater than left pleural effusions.
Calcified pleural plaque on the left posteriorly. Stable
cardiomegaly with right atrial, coronary sinus and right ventricular
leads noted. Chronic rounded atelectasis at the left lung base
posteriorly. Bibasilar dependent and subsegmental atelectasis.

Hepatobiliary: Gallbladder is contracted. No gallstones are seen. No
space-occupying mass of liver. No ductal dilatation.

Pancreas: No pancreatic ductal dilatation or focal mass.

Spleen: Normal

Adrenals/Urinary Tract: Normal adrenal glands. Right lower pole
nonobstructing 8.5 mm renal stone. Stable left lower pole 3 cm
exophytic renal cyst with post contrast Hounsfield unit of 29
unchanged from prior. Mural nodular density suggested on prior exam
is not as apparent on this exam. This exophytic cyst again
demonstrates peripheral thin mural calcifications.

Stomach/Bowel: The stomach is distended with contrast and fluid.
There is no bowel obstruction or acute inflammatory process
involving small nor large bowel. There is mild diffuse distention of
small intestine approximately, nonspecific in etiology. Mild
enteritis is not excluded. Circular muscle hypertrophy and sigmoid
diverticulosis noted as before.

Vascular/Lymphatic: Aortoiliac and branch vessel atherosclerosis
without aneurysm or dissection. No lymphadenopathy.

Reproductive: Radiation seeds noted of the normal size prostate.

Other: No free air or free fluid.

Musculoskeletal: Schmorl's node involving the superior endplate of
L4 with degenerative disc disease L2-3 and L4-5. Multilevel
osteophytes most prominent from T9 through T12.
IMPRESSION: There are a few mildly distended fluid filled small bowel loops
without evidence of acute inflammation or bowel obstruction. Mild
ileus or enteritis could have this appearance of nonspecific.

Sigmoid diverticulosis with circular muscle hypertrophy.

Complex lower pole 3 cm exophytic cyst with thin peripheral
calcifications suggesting a Bosniak category 2 renal cyst.

Nonobstructing stable right lower pole 8.5 mm renal calculus.

New small bilateral pleural effusions. Left-sided calcified pleural
plaque consistent with prior asbestos exposure.

## 2017-08-20 IMAGING — CT CT HEAD CODE STROKE
3 of 4 series · 16 of 47 positions shown, 19 images · non-contrast
Comparison: 06/04/2013.  02/15/2013.

ADDENDUM:
These results were called by telephone at the time of interpretation
on 01/20/2016 at [DATE] to Dr. Amazigh, who verbally
acknowledged these results.
CLINICAL DATA: Code stroke.  Code stroke.  Right leg numbness.

EXAM:
CT HEAD WITHOUT CONTRAST
TECHNIQUE: Contiguous axial images were obtained from the base of the skull
through the vertex without intravenous contrast.

[Series 201: head w/o, idose (1) · axial · non-contrast · 0.49mm/px · z∈[+96,+226]mm · 10 of 32 slices shown, 13 images]
[im 3/32  brain]
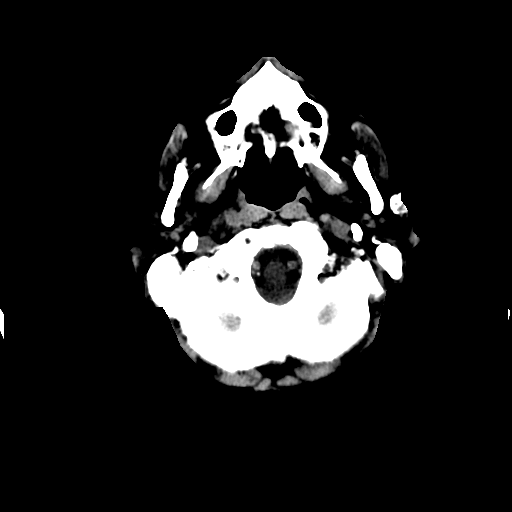
[im 3/32  bone]
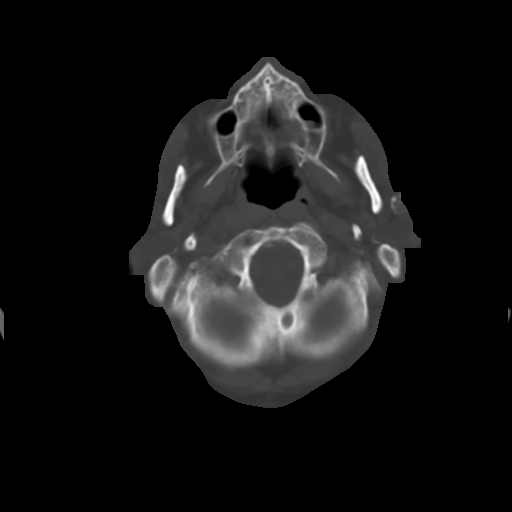
[im 5/32  brain]
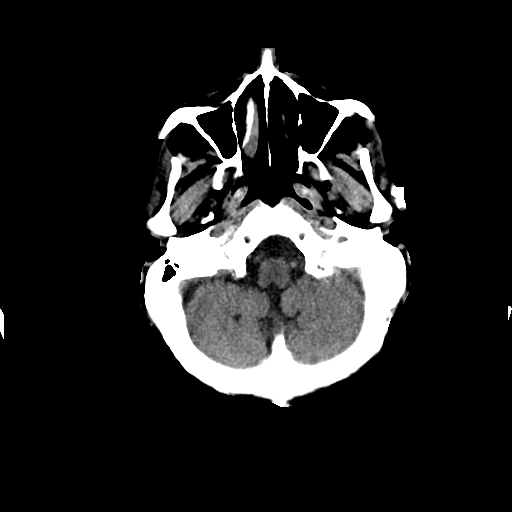
[im 9/32  brain]
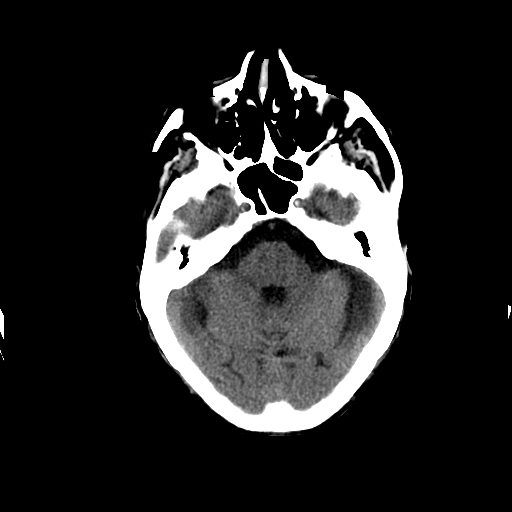
[im 12/32  brain]
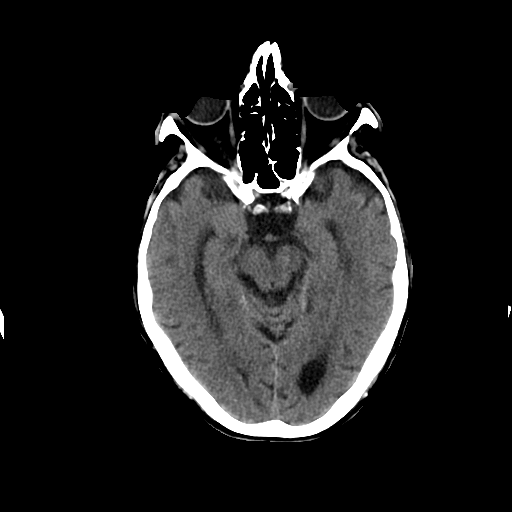
[im 14/32  brain]
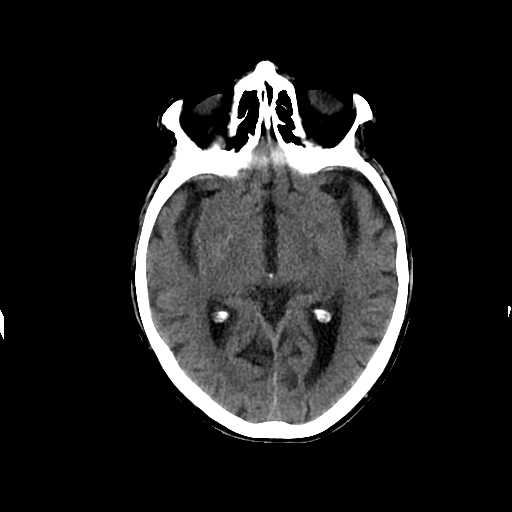
[im 14/32  bone]
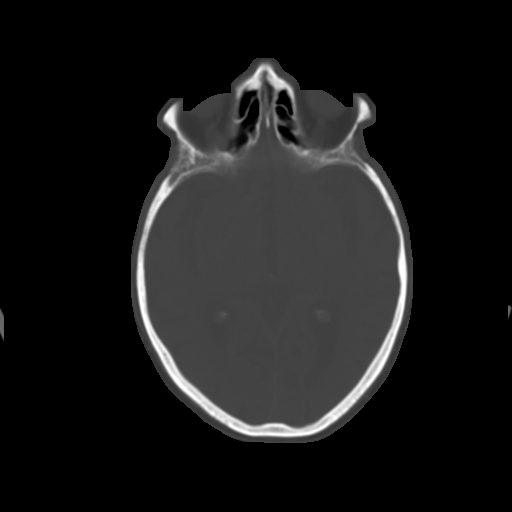
[im 18/32  brain]
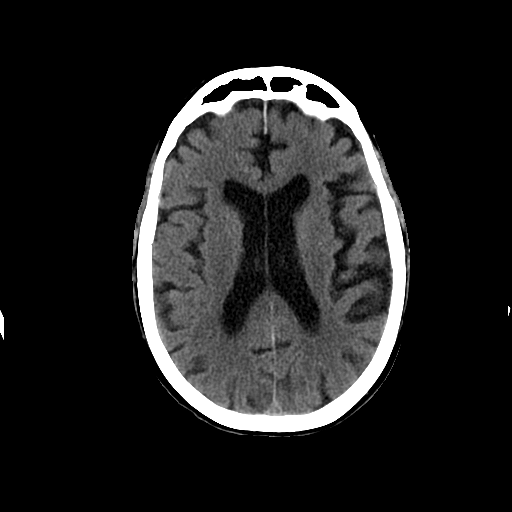
[im 20/32  brain]
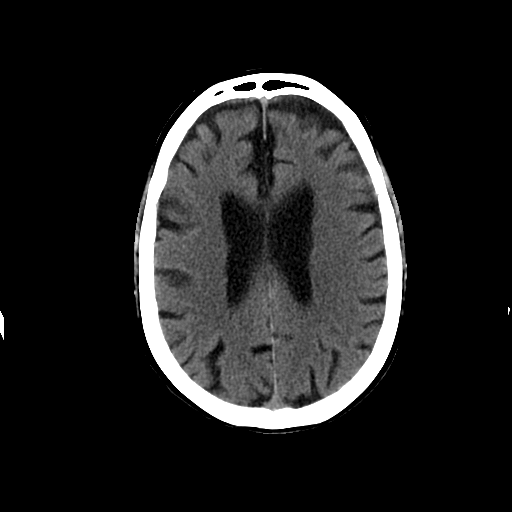
[im 23/32  brain]
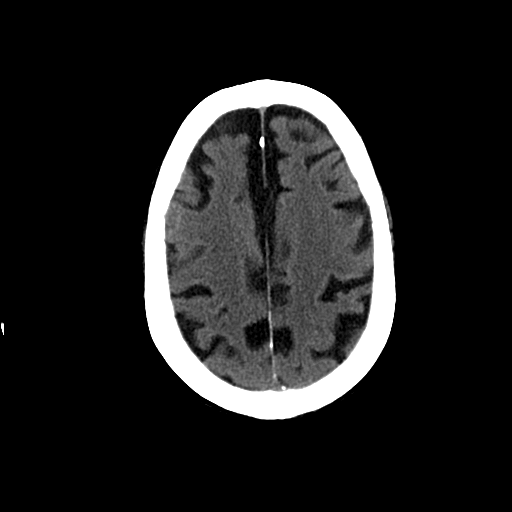
[im 27/32  brain]
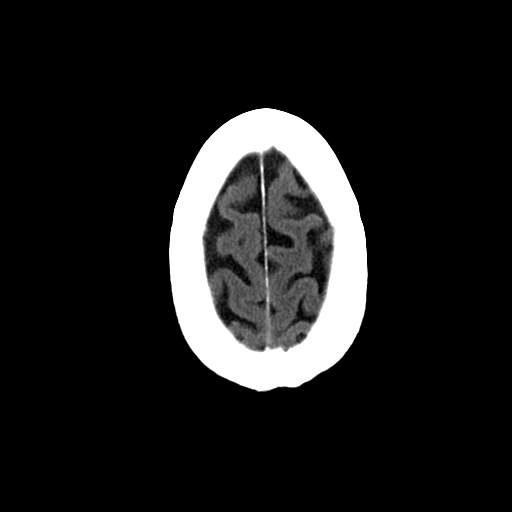
[im 27/32  bone]
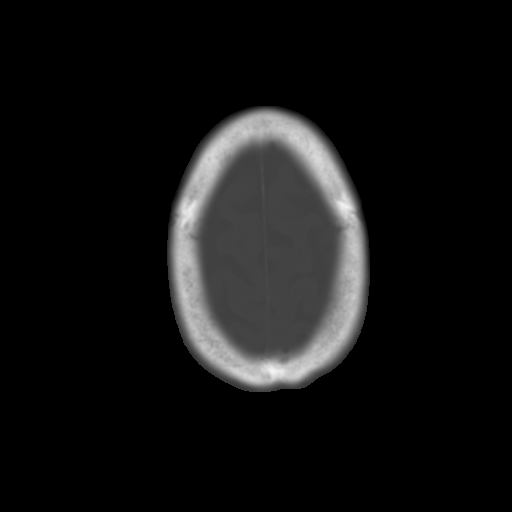
[im 29/32  brain]
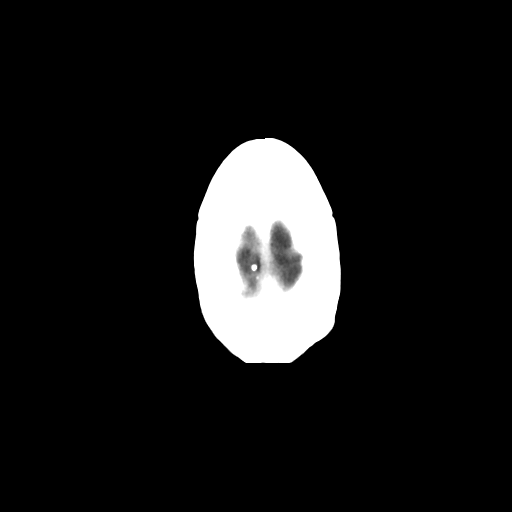

[Series 203: coronal st, idose (1) · coronal · 0.40mm/px · 3 of 81 slices shown]
[im 27/81  brain]
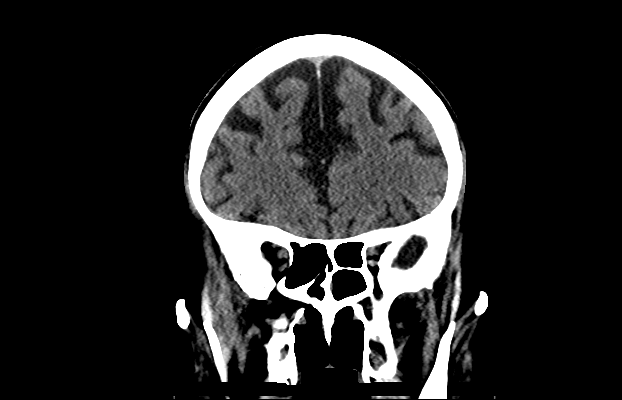
[im 36/81  brain]
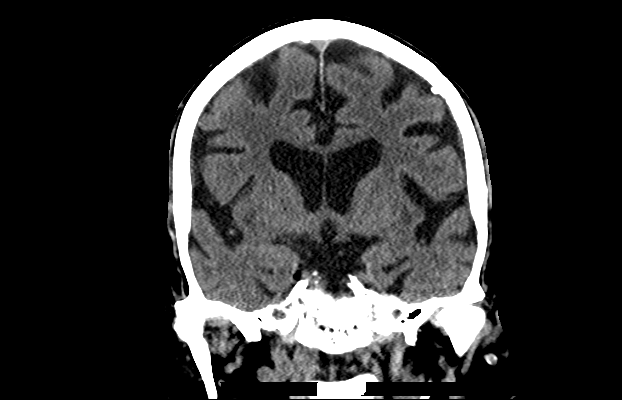
[im 45/81  brain]
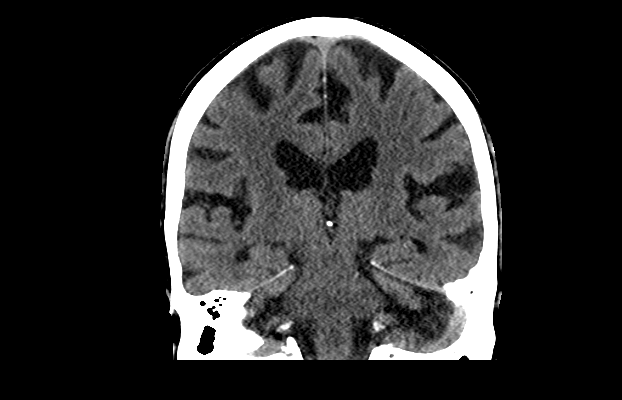

[Series 204: sagittal st, idose (1) · sagittal · 0.40mm/px · 3 of 82 slices shown]
[im 28/82  brain]
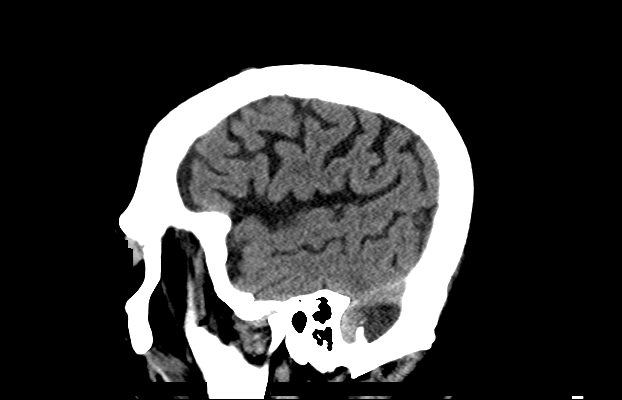
[im 41/82  brain]
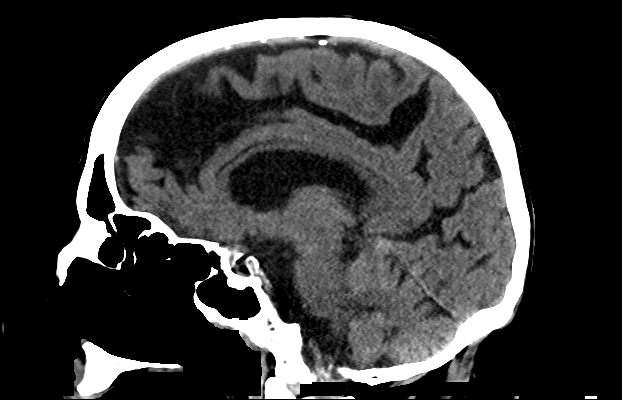
[im 55/82  brain]
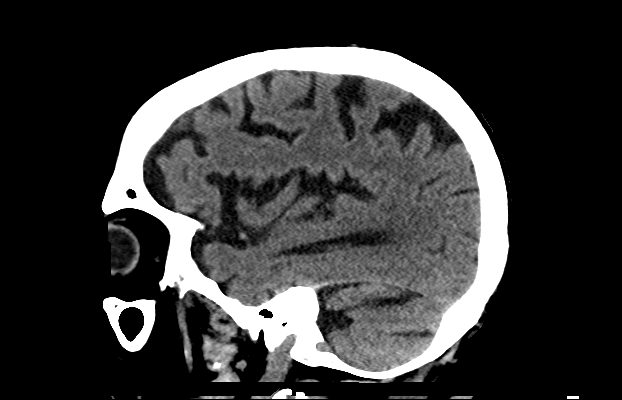

[16 of 47 positions shown; findings below may reference images not displayed]

FINDINGS: Brain: Generalized brain atrophy. Old small vessel infarction
inferior cerebellum on the right. Chronic small-vessel ischemic
change of the hemispheric white matter. No evidence of acute
infarction, mass lesion, hemorrhage, hydrocephalus or extra-axial
collection.

Vascular: There is atherosclerotic calcification of the major
vessels at the base of the brain.

Skull: Negative

Sinuses/Orbits: Clear/normal

Other: None significant

ASPECTS (Alberta Stroke Program Early CT Score)

- Ganglionic level infarction (caudate, lentiform nuclei, internal
capsule, insula, M1-M3 cortex): 7

- Supraganglionic infarction (M4-M6 cortex): 3

Total score (0-10 with 10 being normal): 10
IMPRESSION: 1. Atrophy. No acute finding. Old small vessel infarction inferior
cerebellum on the right.
2. ASPECTS is 10

Call report in process.

## 2017-08-30 ENCOUNTER — Encounter: Payer: Self-pay | Admitting: Cardiology

## 2017-09-06 ENCOUNTER — Telehealth: Payer: Self-pay | Admitting: Internal Medicine

## 2017-09-06 NOTE — Telephone Encounter (Signed)
Noted.  Sent to appropriate channels.

## 2017-09-06 NOTE — Telephone Encounter (Signed)
New Message:    Daughter wanted you to know pt passed away in 03-23-2022.

## 2017-09-06 NOTE — Telephone Encounter (Signed)
Noted in [place]. Patient released in [email or web address]

## 2017-10-10 IMAGING — CR DG CHEST 2V
2 series · 2 of 2 positions shown · non-contrast
Comparison: 01/16/2016

CLINICAL DATA: Chest pain

EXAM:
CHEST  2 VIEW

[chest pa]
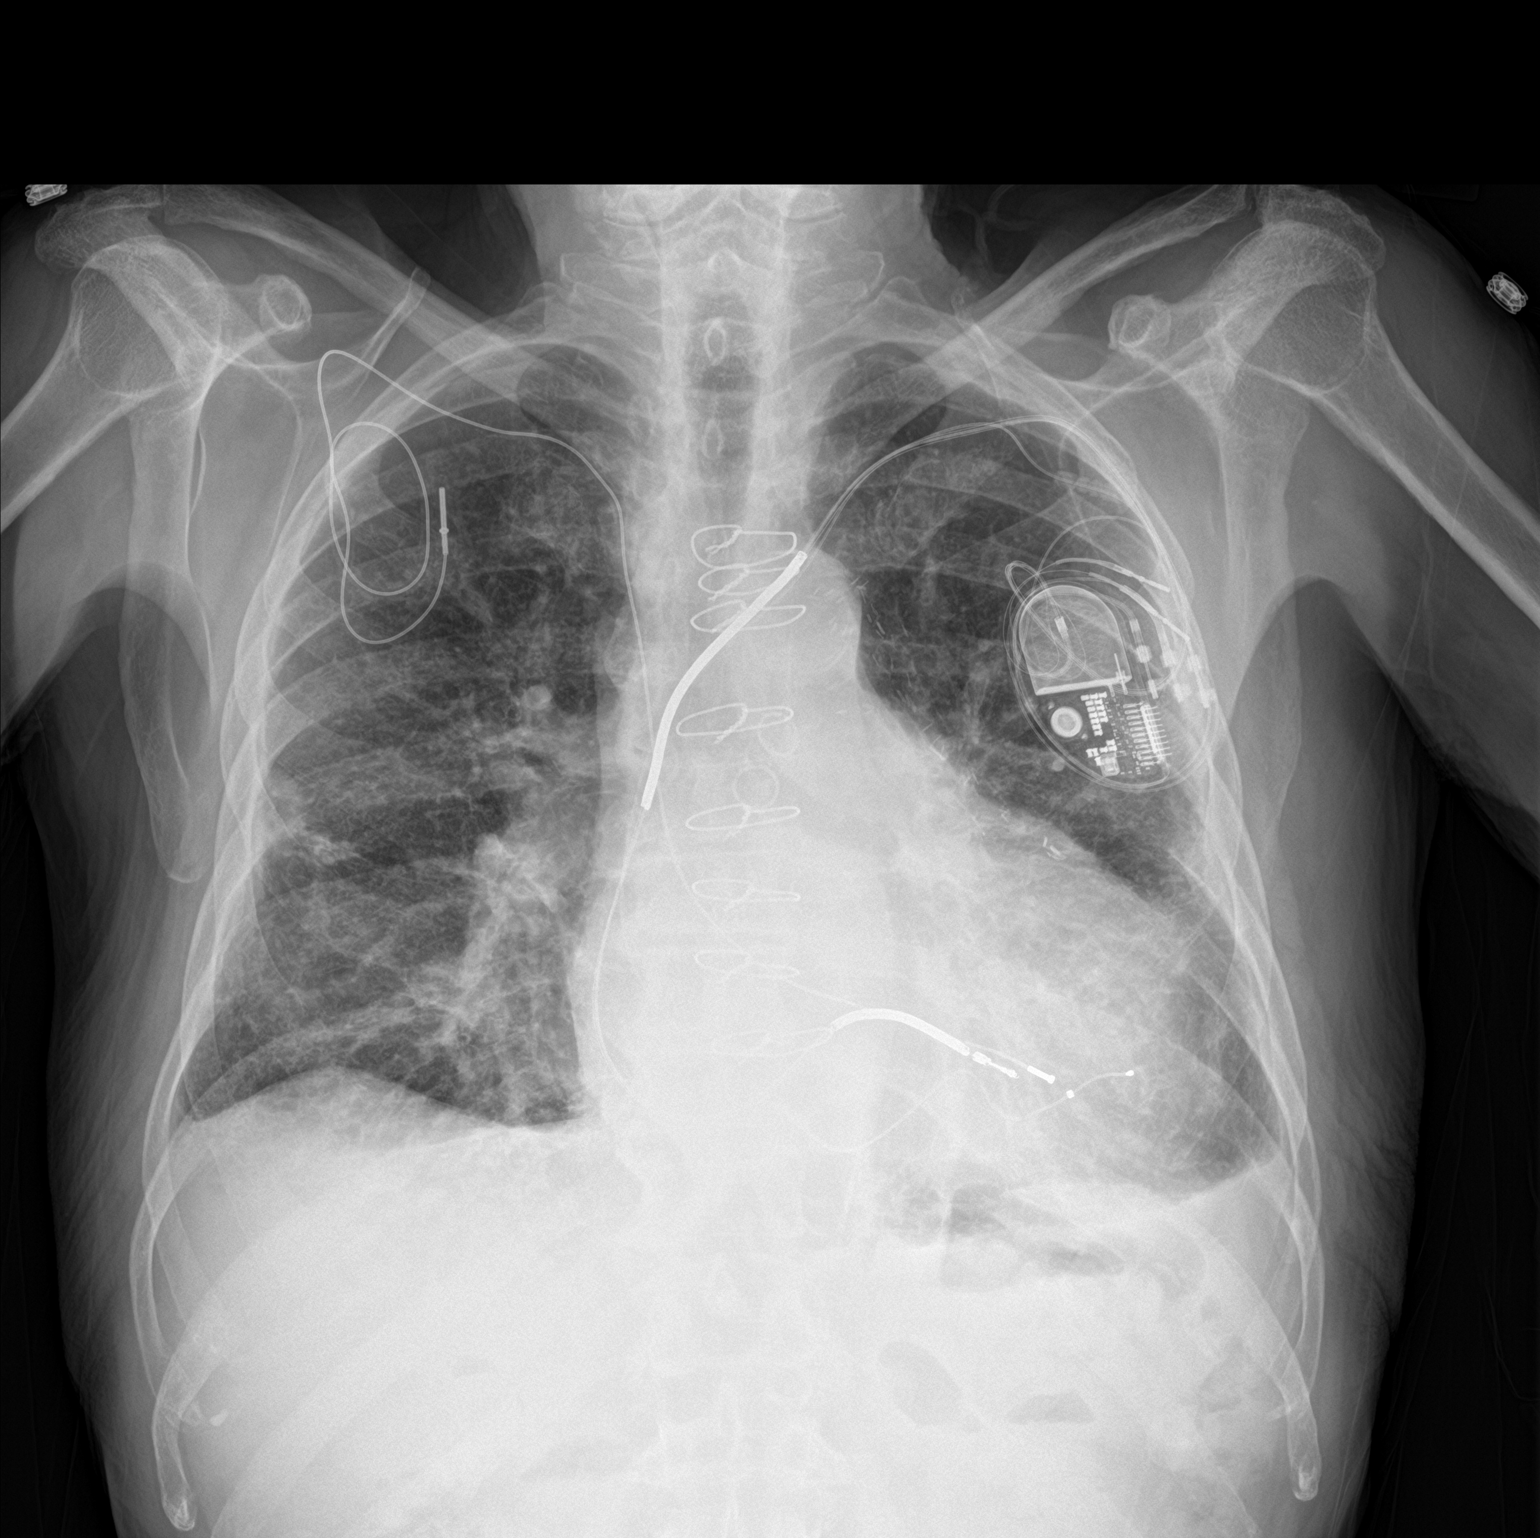

[chest lat]
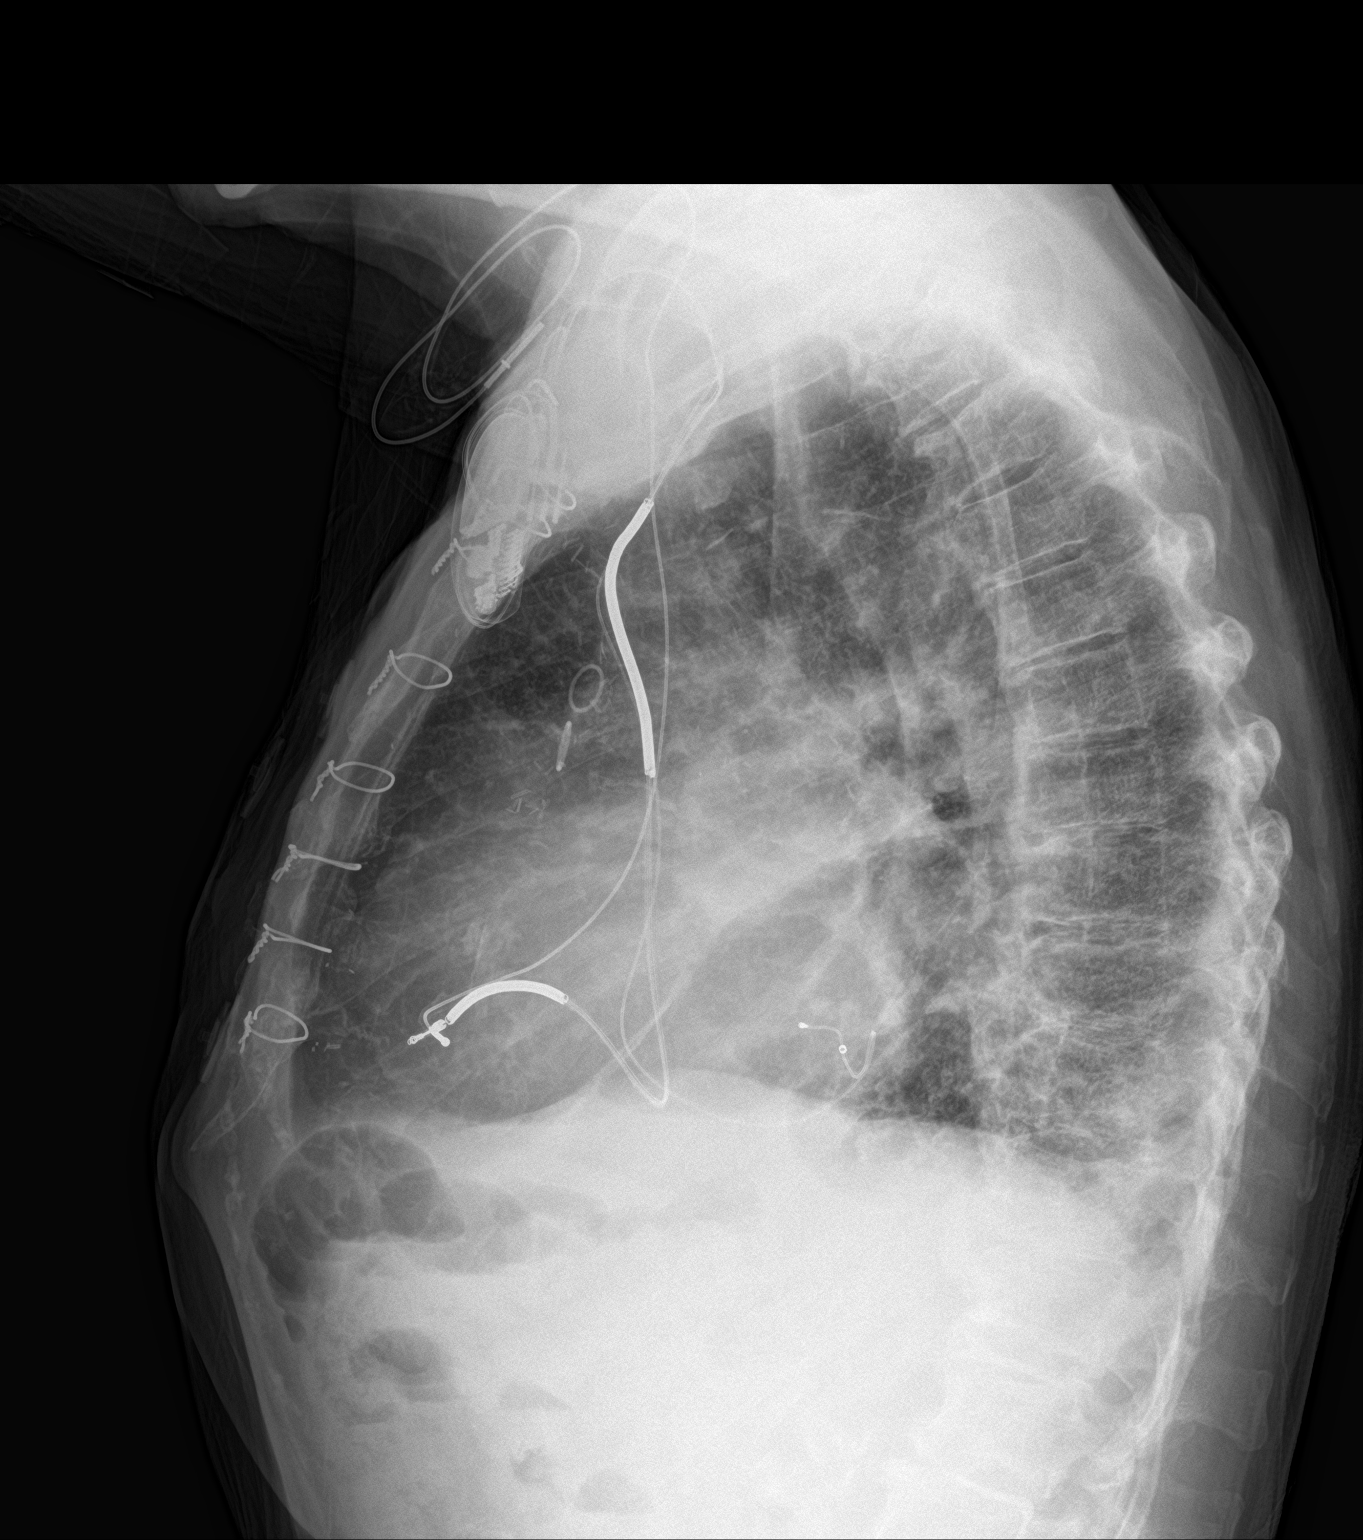

[2 of 2 positions shown; findings below may reference images not displayed]

FINDINGS: LEFT-sided pacemaker with 2 continuous leads overlies stable
enlarged cardiac silhouette. There is small LEFT effusion. Lower
lobe chronic interstitial pattern with mild interstitial edema. No
focal consolidation. No pneumothorax.
IMPRESSION: 1. No significant change.
2. Cardiomegaly small LEFT effusion.
3. Chronic interstitial findings in lower lobes with mild
interstitial edema.

## 2017-10-20 IMAGING — DX DG CHEST 2V
2 series · 2 of 2 positions shown · non-contrast
Comparison: Chest radiograph performed 03/11/2016

CLINICAL DATA: Acute onset of shortness of breath. Initial
encounter.

EXAM:
CHEST  2 VIEW

[chest pa]
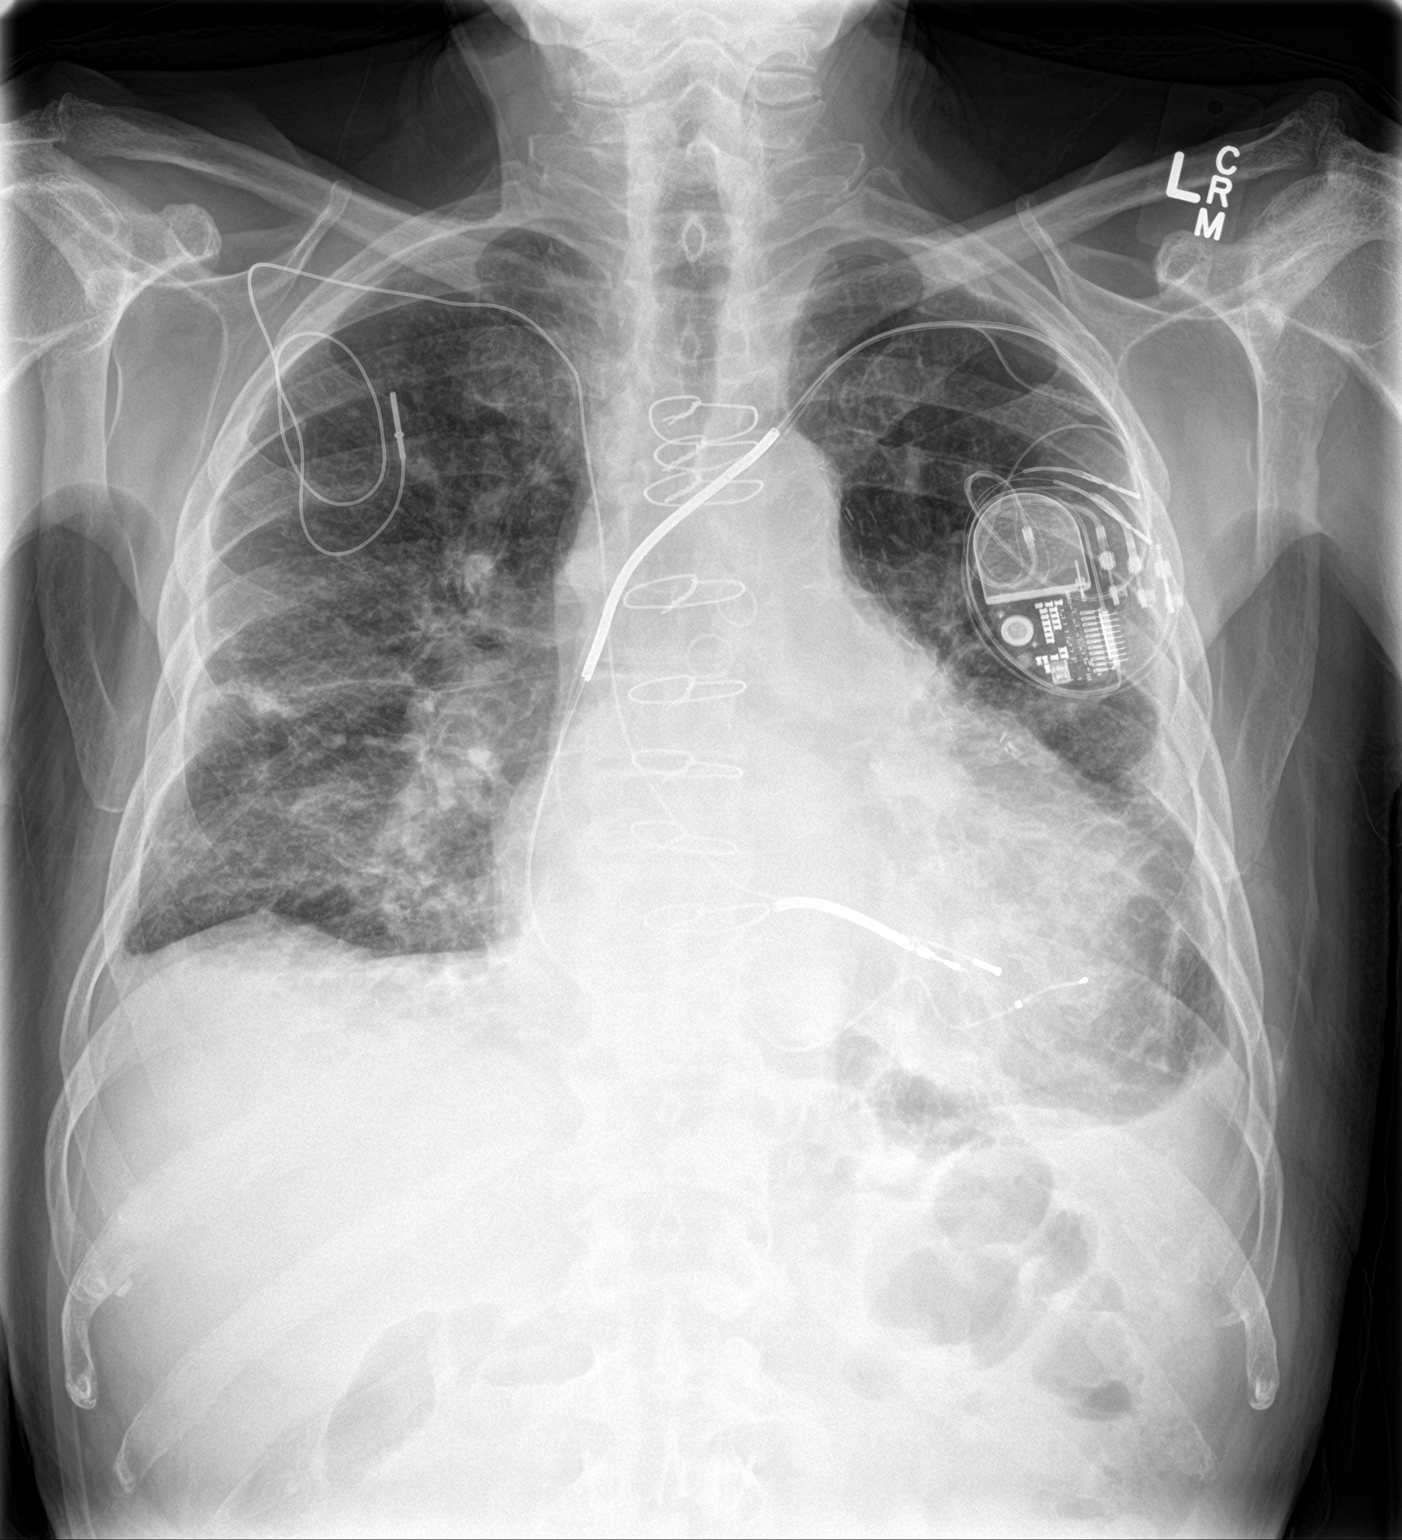

[chest lat]
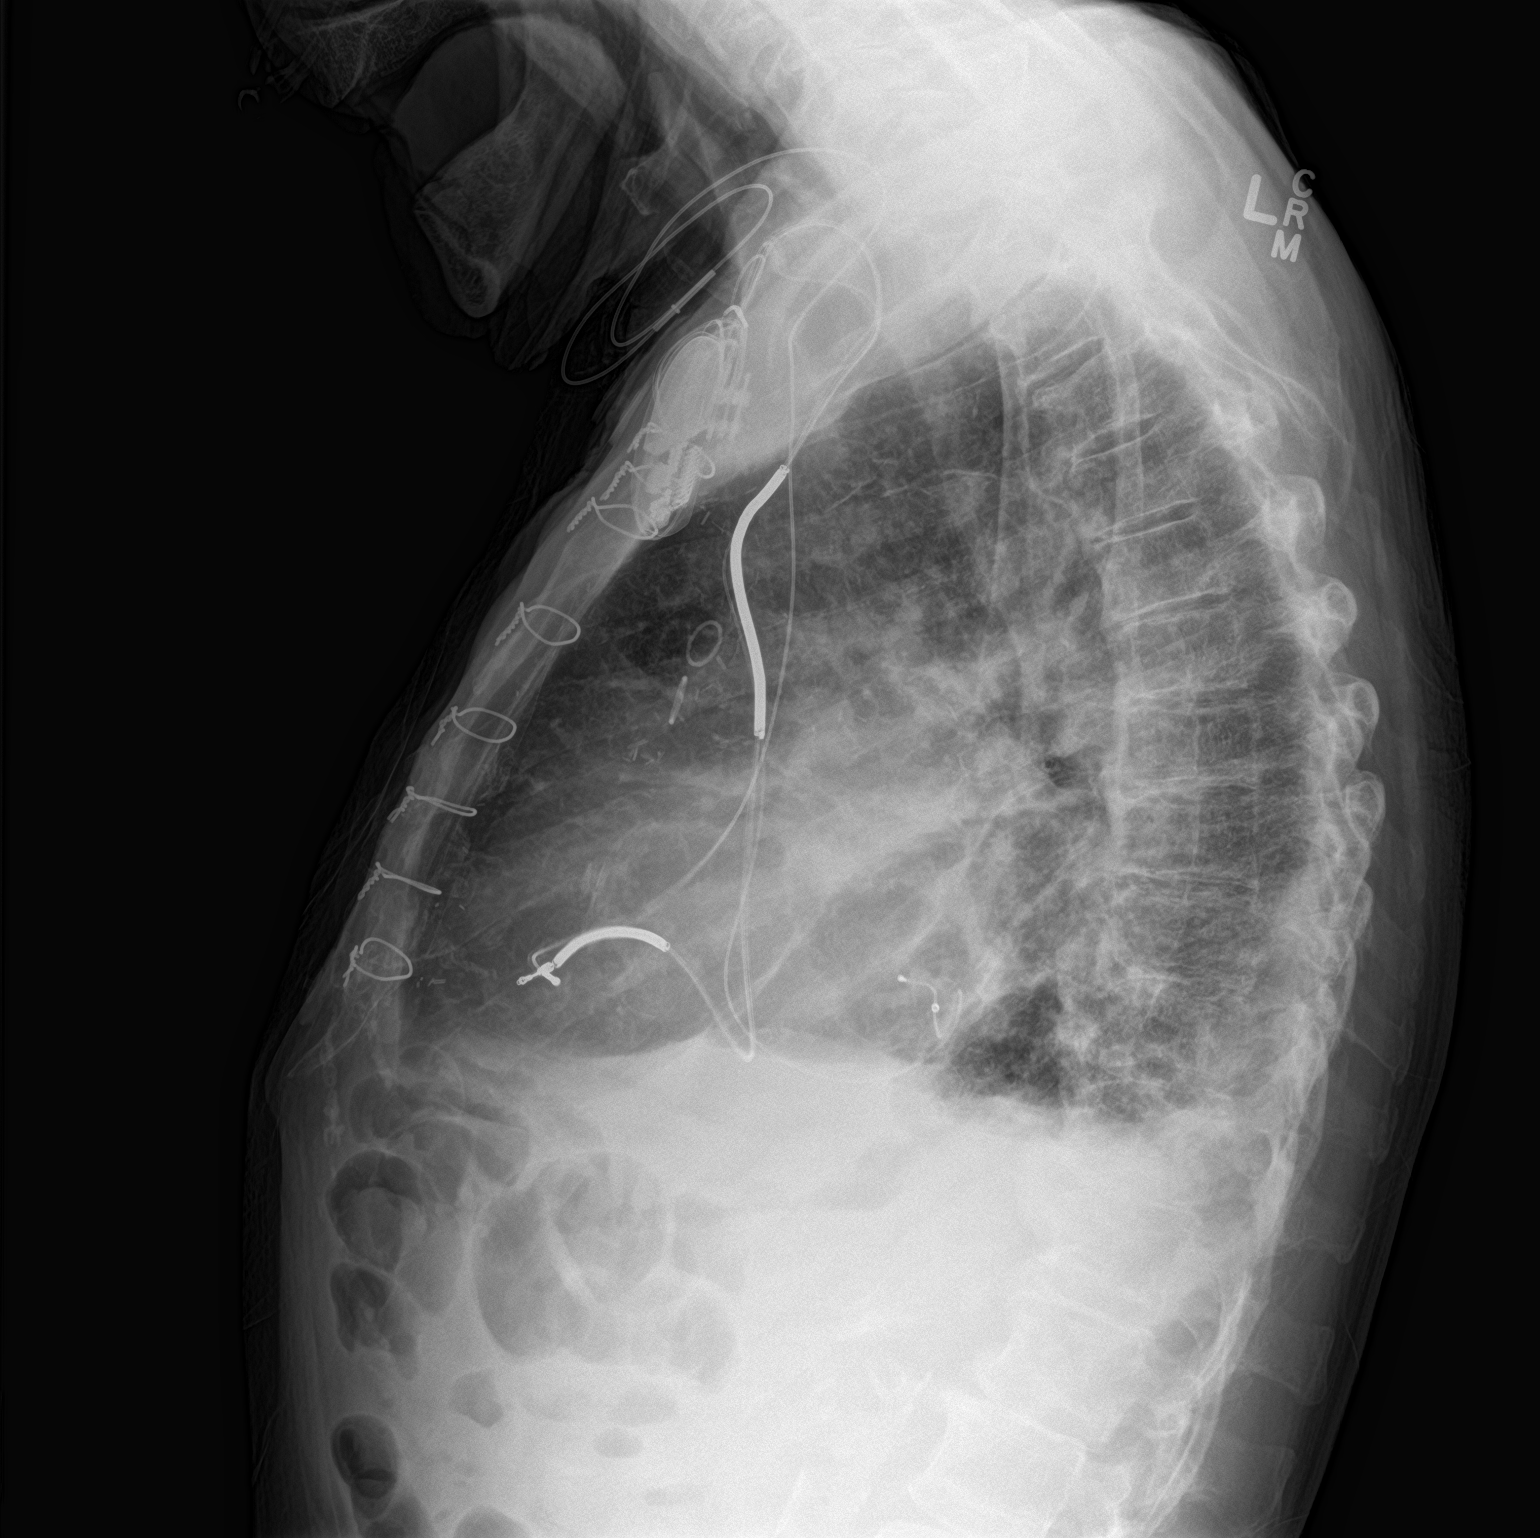

[2 of 2 positions shown; findings below may reference images not displayed]

FINDINGS: The lungs are well-aerated. Small left pleural effusion is noted.
Vascular congestion is seen. Increased interstitial markings likely
reflect pulmonary edema, more prominent than on the prior study. No
pneumothorax is seen. Mild chronic scarring is noted at the right
midlung zone.

The heart is borderline enlarged. The patient is status post median
sternotomy, with evidence of prior CABG. A pacemaker/AICD is noted
at the left chest wall, with leads ending at the right atrium, right
ventricle and coronary sinus. A right-sided orphaned lead is also
noted. No acute osseous abnormalities are seen.
IMPRESSION: Small left pleural effusion. Vascular congestion and borderline
cardiomegaly. Increased interstitial markings likely reflect
pulmonary edema, more prominent than on the prior study.

## 2017-10-28 IMAGING — DX DG CHEST 2V
2 series · 2 of 2 positions shown · non-contrast
Comparison: March 21, 2016

CLINICAL DATA: Shortness of breath.  Productive cough for 1 month.

EXAM:
CHEST  2 VIEW

[chest pa]
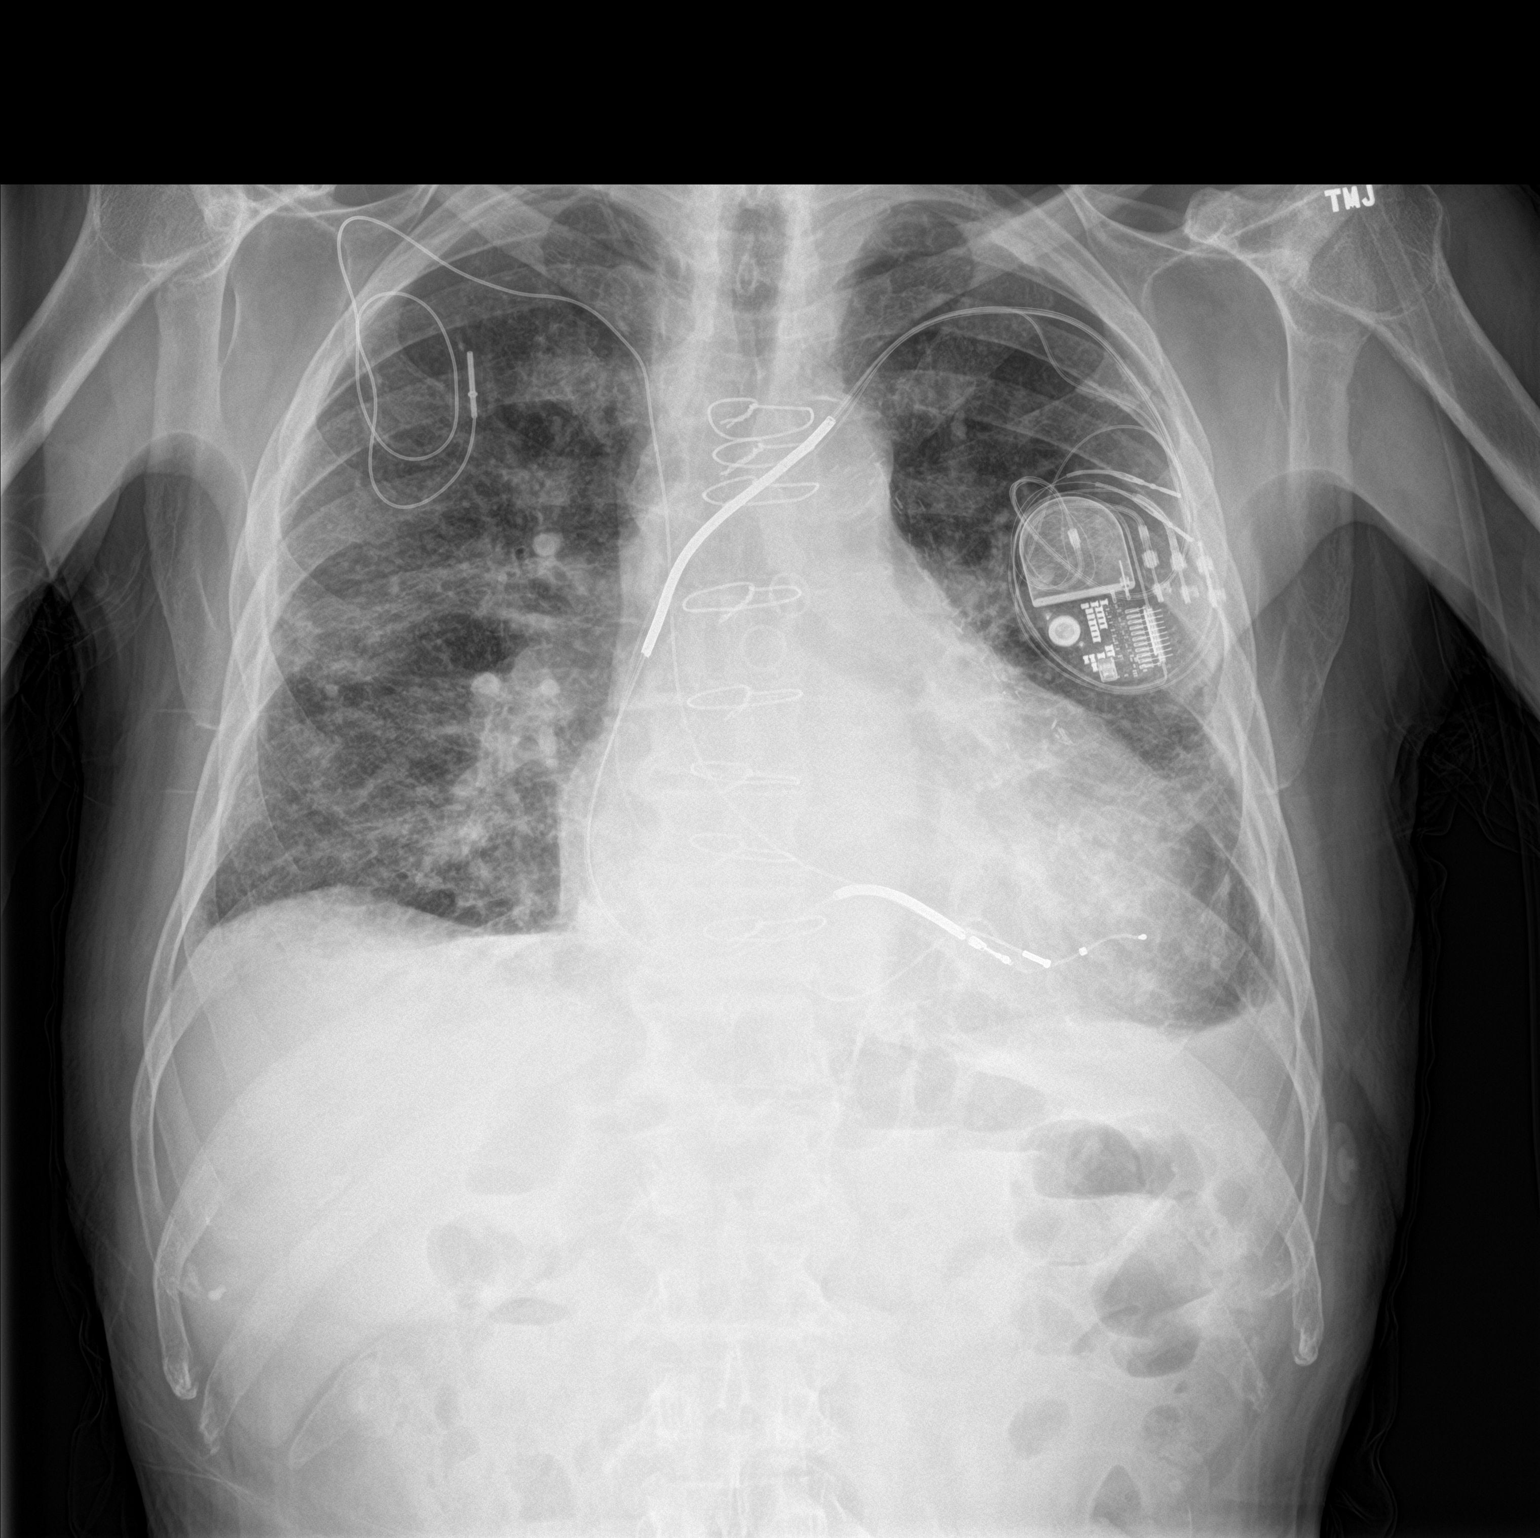

[chest lat]
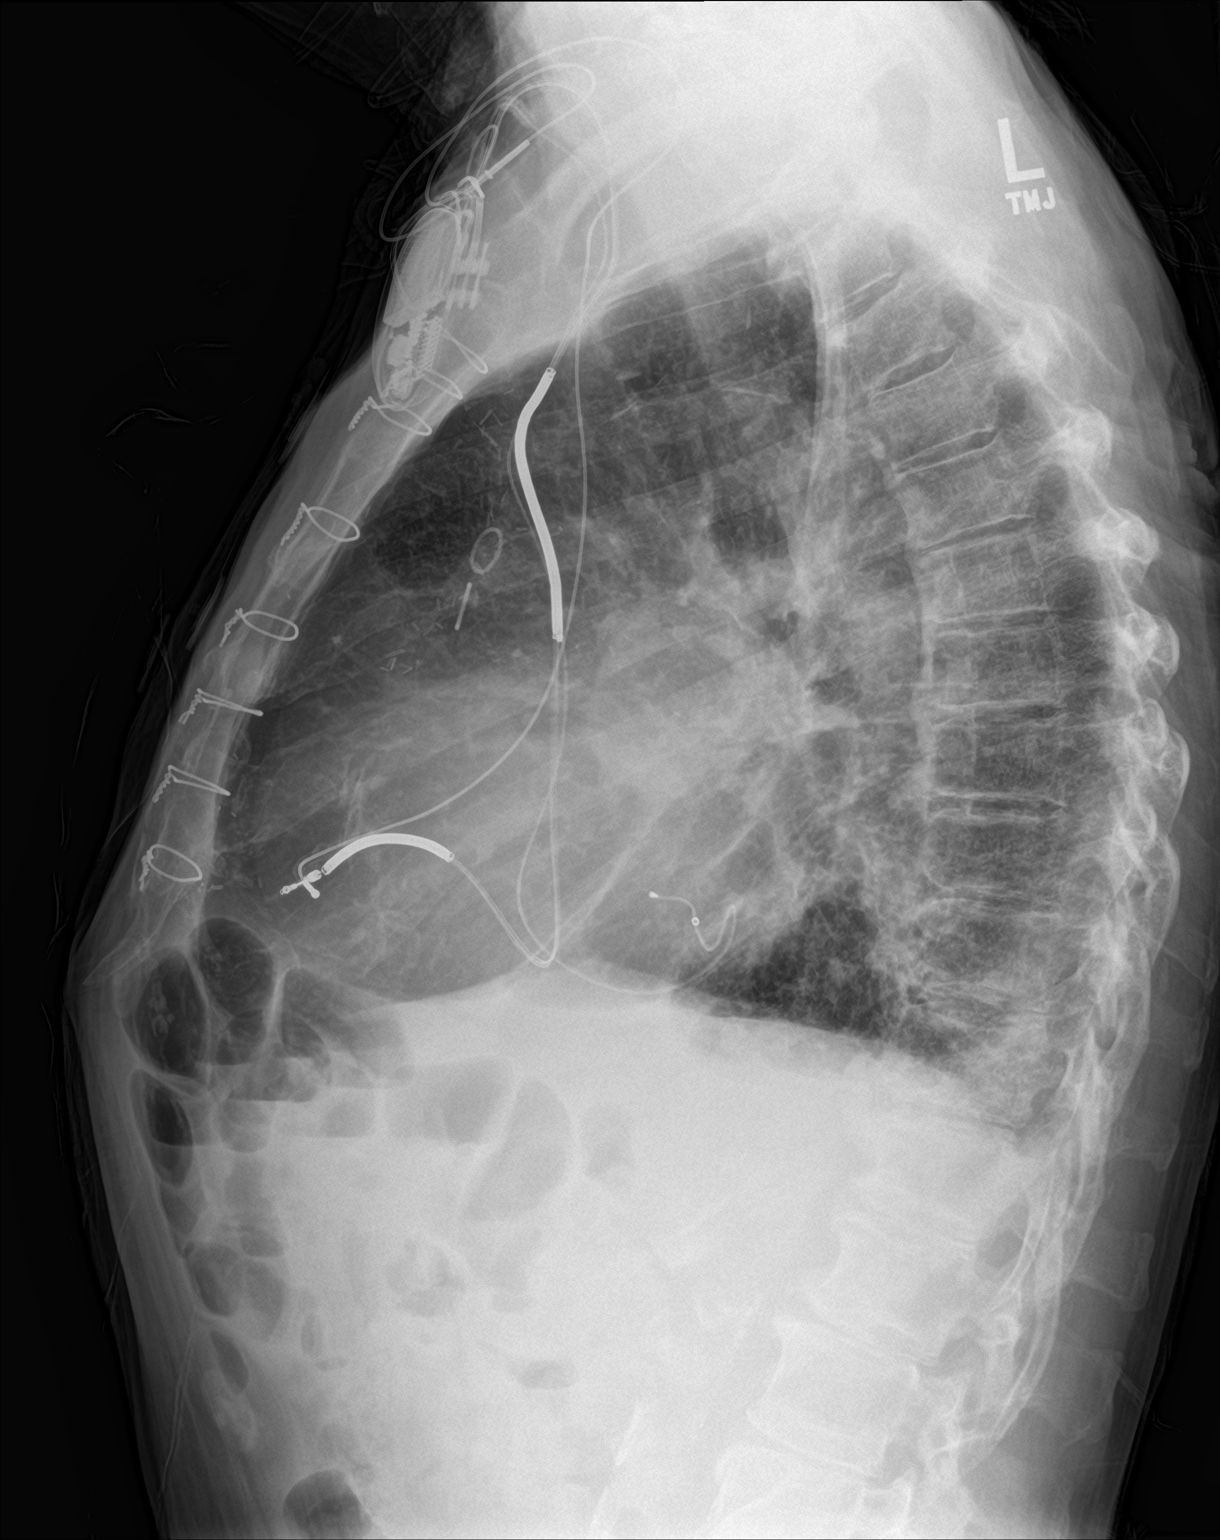

[2 of 2 positions shown; findings below may reference images not displayed]

FINDINGS: No pneumothorax. Stable AICD device. Stable cardiomegaly. Stable
interstitial markings in the lungs and small left pleural effusion.
No significant interval change.
IMPRESSION: 1. No interval change. Stable small left effusion. Interstitial
prominence worsened since Monday May, 2015 but stable since March 21, 2016. I suspect a component of edema.

## 2017-12-27 ENCOUNTER — Encounter: Payer: Self-pay | Admitting: Cardiology

## 2018-02-05 IMAGING — CT CT ABD-PELV W/ CM
2 of 5 series · 16 of 46 positions shown, 18 images · IV contrast (iopamidol)
Comparison: 01/17/2016

CLINICAL DATA: Right lower quadrant pain

EXAM:
CT ABDOMEN AND PELVIS WITH CONTRAST
TECHNIQUE: Multidetector CT imaging of the abdomen and pelvis was performed
using the standard protocol following bolus administration of
intravenous contrast.
CONTRAST:  100mL [id number] IOPAMIDOL ([id number]) INJECTION 61%

[Series 3: a/p w/ 5mm · axial · 0.70mm/px · z∈[-376,+24]mm · 13 of 90 slices shown, 15 images]
[im 5/90  soft-tissue]
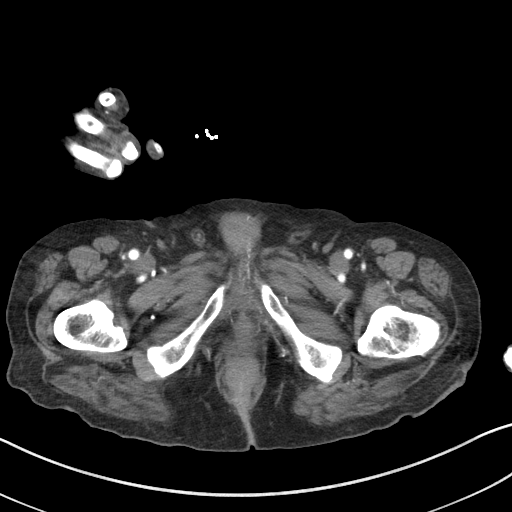
[im 5/90  bone]
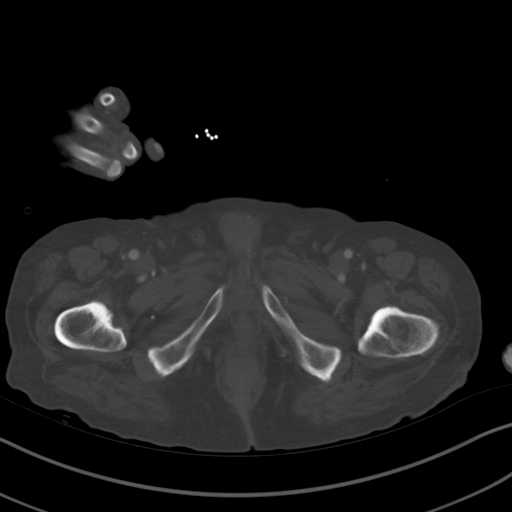
[im 13/90  soft-tissue]
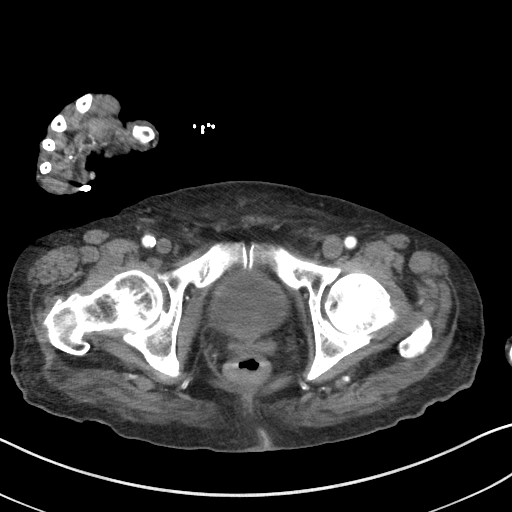
[im 17/90  soft-tissue]
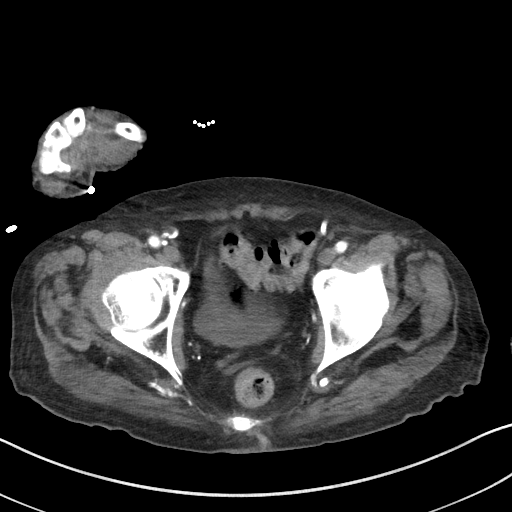
[im 26/90  soft-tissue]
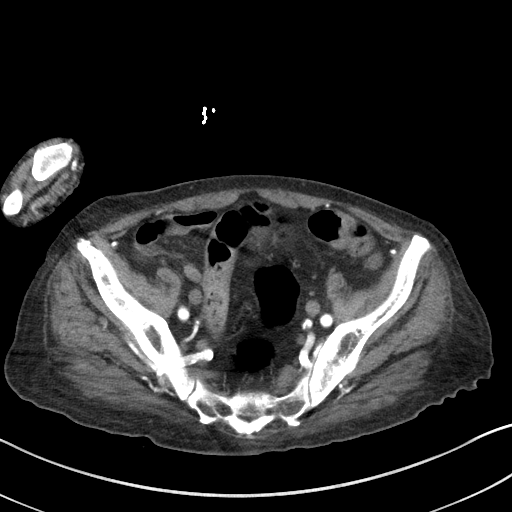
[im 30/90  soft-tissue]
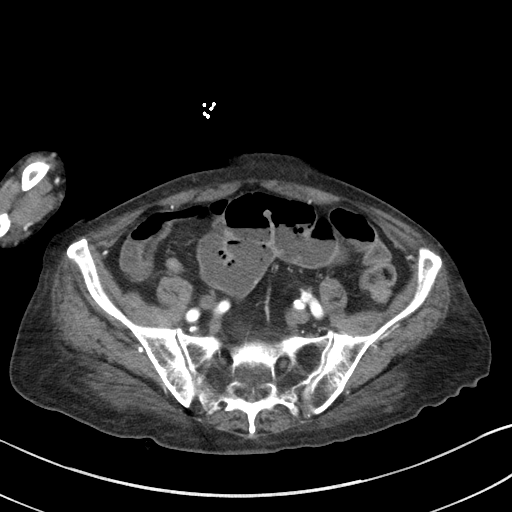
[im 39/90  soft-tissue]
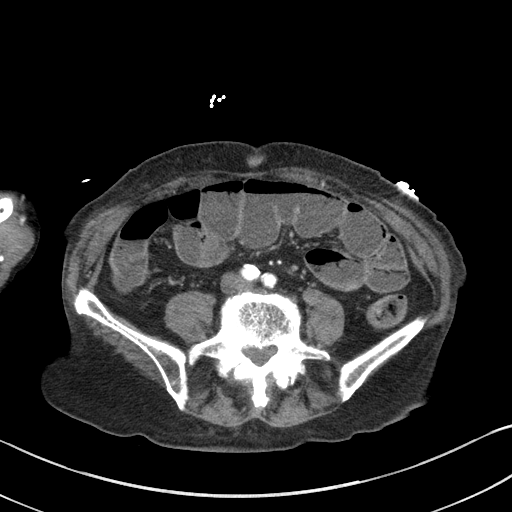
[im 47/90  soft-tissue]
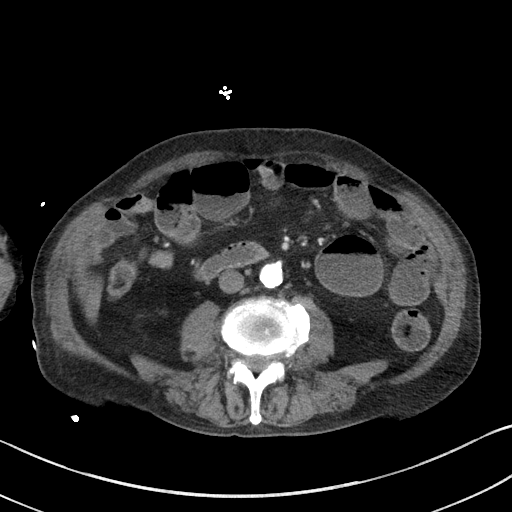
[im 51/90  soft-tissue]
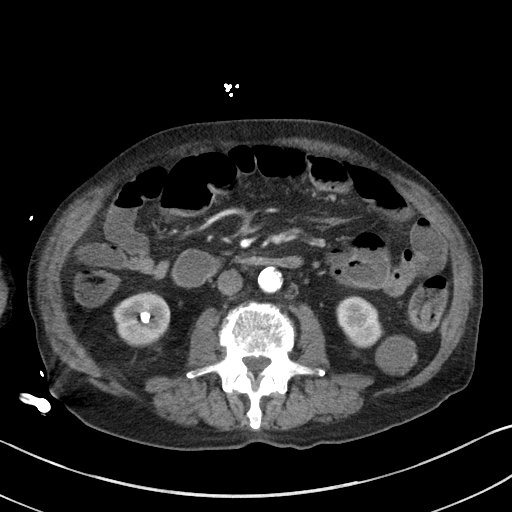
[im 60/90  soft-tissue]
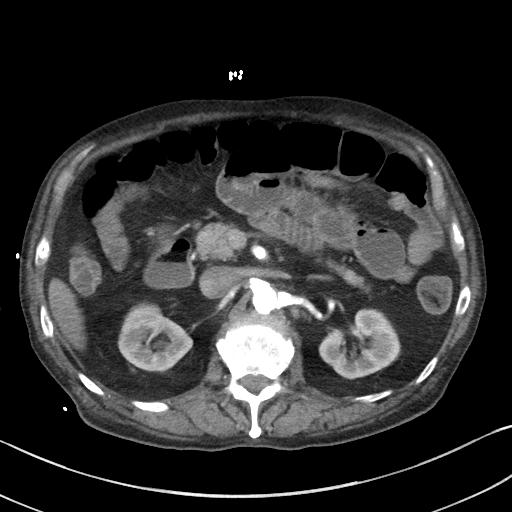
[im 60/90  bone]
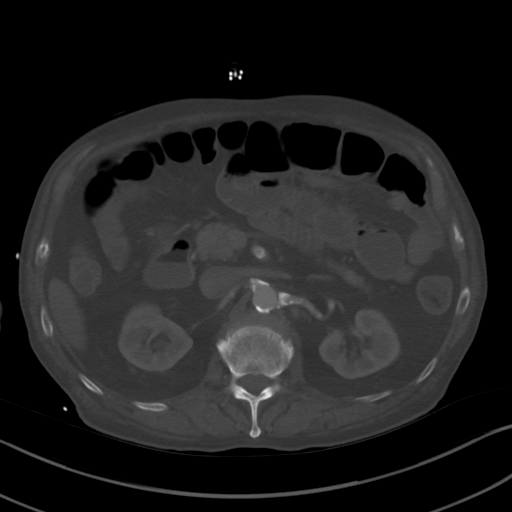
[im 64/90  soft-tissue]
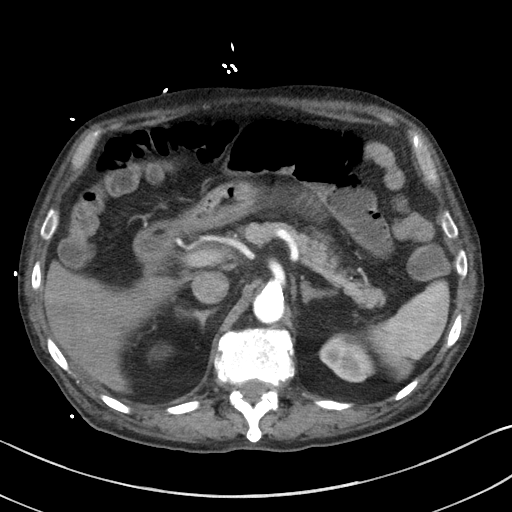
[im 73/90  soft-tissue]
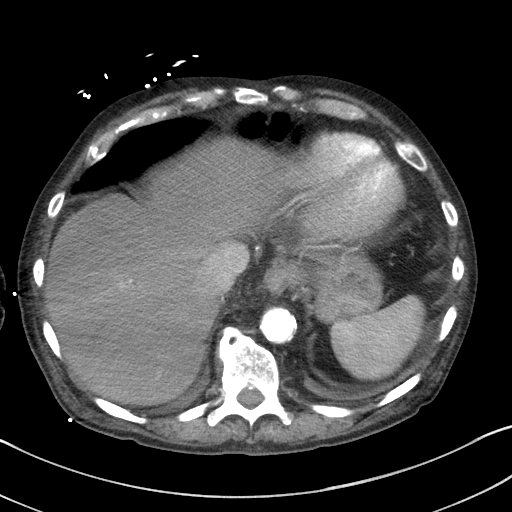
[im 77/90  soft-tissue]
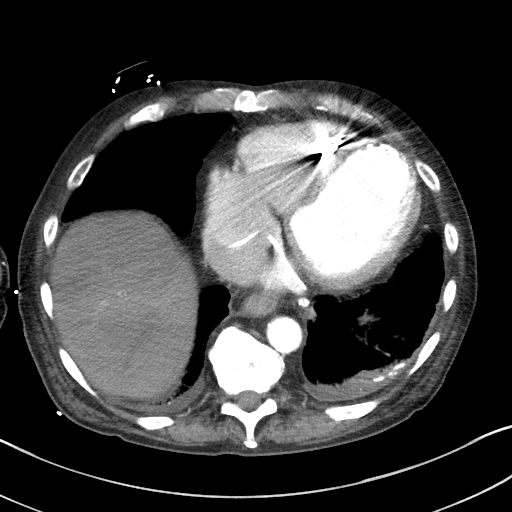
[im 85/90  soft-tissue]
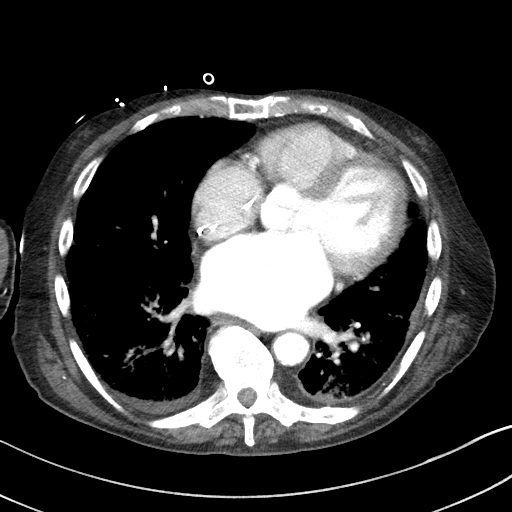

[Series 6: a/p w/ cor · coronal · 0.67mm/px · 3 of 151 slices shown]
[im 51/151  soft-tissue]
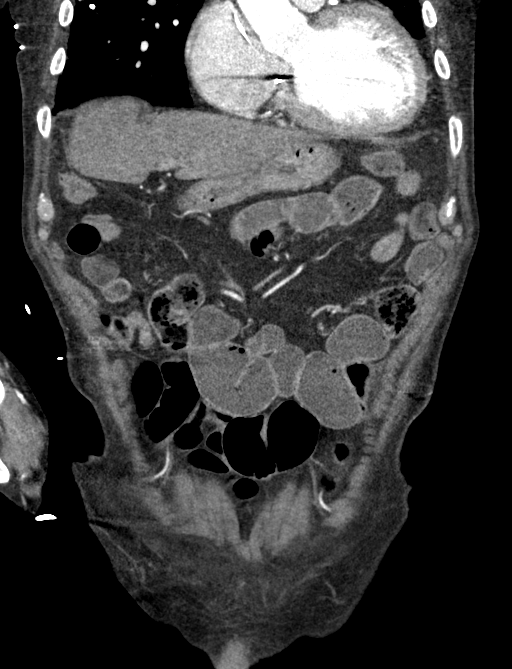
[im 67/151  soft-tissue]
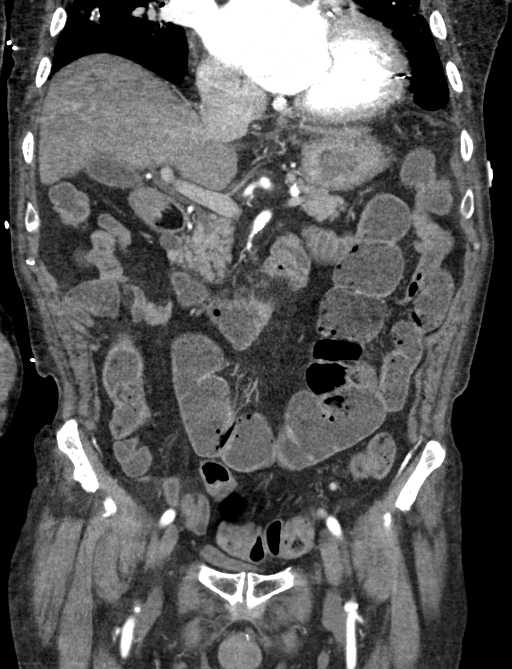
[im 84/151  soft-tissue]
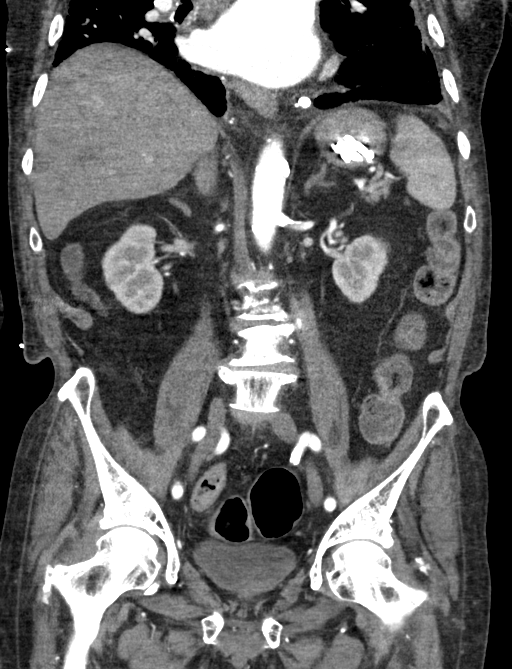

[16 of 46 positions shown; findings below may reference images not displayed]

FINDINGS: Lower chest: Small right greater than left pleural effusions.
Calcified pleural plaque on the left posteriorly.

Stable cardiomegaly with right atrial, coronary sinus and right
ventricular
leads noted. Chronic rounded atelectasis at the left lung base
posteriorly. Bibasilar dependent and subsegmental atelectasis.

Hepatobiliary:

Gallbladder is contracted.

No gallstones are seen.

No space-occupying mass of liver. No ductal dilatation.

Pancreas:

No pancreatic ductal dilatation or focal mass.

Spleen:

Normal

Adrenals/Urinary Tract:

Normal adrenal glands.

Right lower pole nonobstructing 8.5 mm renal stone.

Stable left lower pole 3 cm exophytic renal cyst.

This exophytic cyst again demonstrates peripheral thin mural
calcifications.

Stomach/Bowel:

The stomach is distended with contrast and fluid.
There is no bowel obstruction.

There is mild diffuse distention of small intestine approximately,
nonspecific in etiology.

Mild enteritis is not excluded.

Circular muscle hypertrophy and sigmoid diverticulosis noted as
before.

Vascular/Lymphatic:

Aortoiliac and branch vessel atherosclerosis.

No aortic aneurysm or dissection. No lymphadenopathy.

Reproductive:

Radiation seeds noted of the normal size prostate.

Other:

No free air or free fluid.

Musculoskeletal:

Schmorl's node involving the superior endplate of L4.

Degenerative disc disease L2-3 and L4-5.

Multilevel osteophytes.
IMPRESSION: Findings suggest mild enteritis.

Sigmoid diverticulosis with circular muscle hypertrophy.

Complex lower pole 3 cm exophytic cyst with thin peripheral
calcifications suggesting a Bosniak category 2 renal cyst.

Nonobstructing stable right lower pole 8.5 mm renal calculus.

Small bilateral pleural effusions.

Left-sided calcified pleural plaque consistent with prior asbestos
exposure.

## 2018-02-22 IMAGING — DX DG CHEST 2V
2 series · 2 of 2 positions shown · non-contrast
Comparison: July 06, 2016

CLINICAL DATA: Shortness of breath.

EXAM:
CHEST  2 VIEW

[w chest pa]
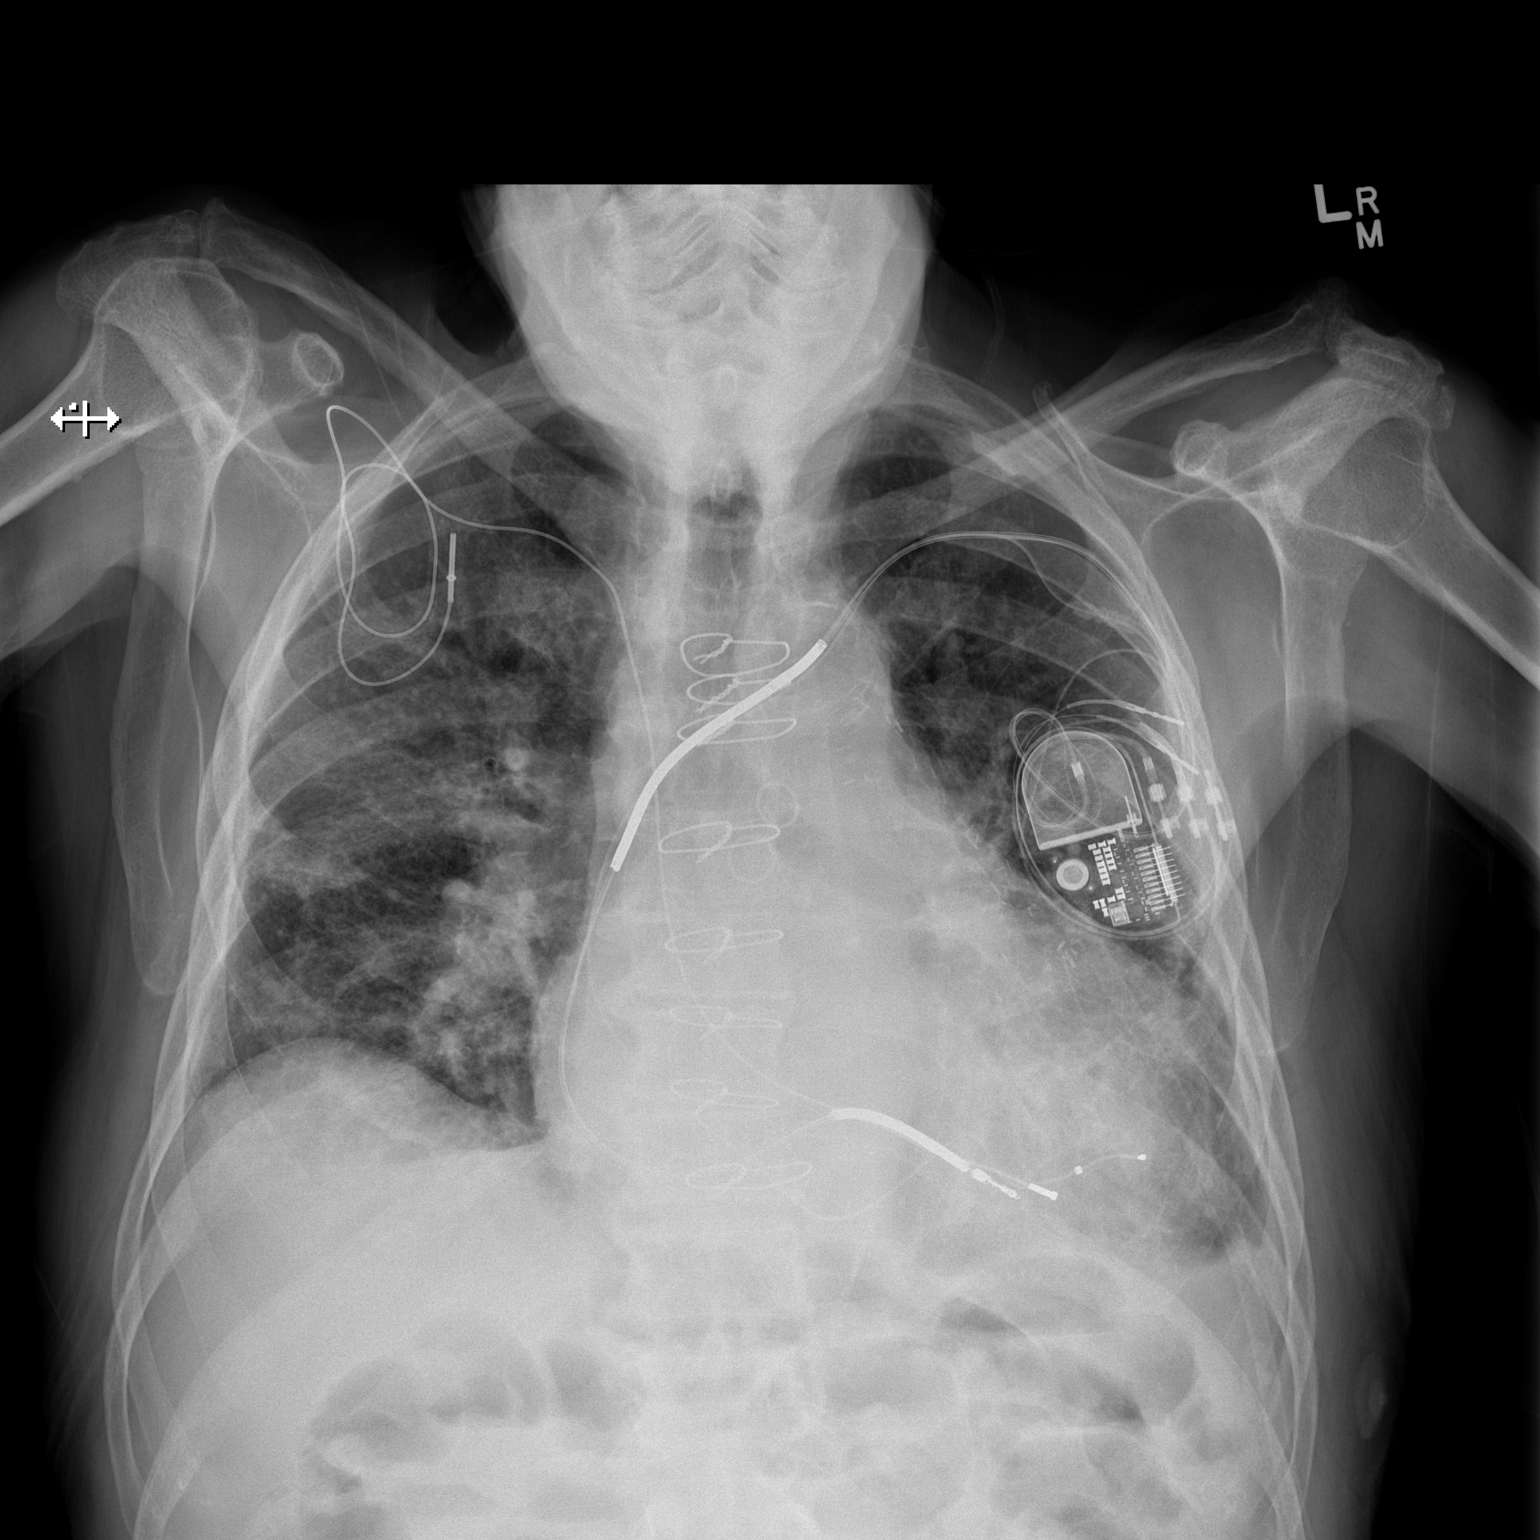

[w chest lat]
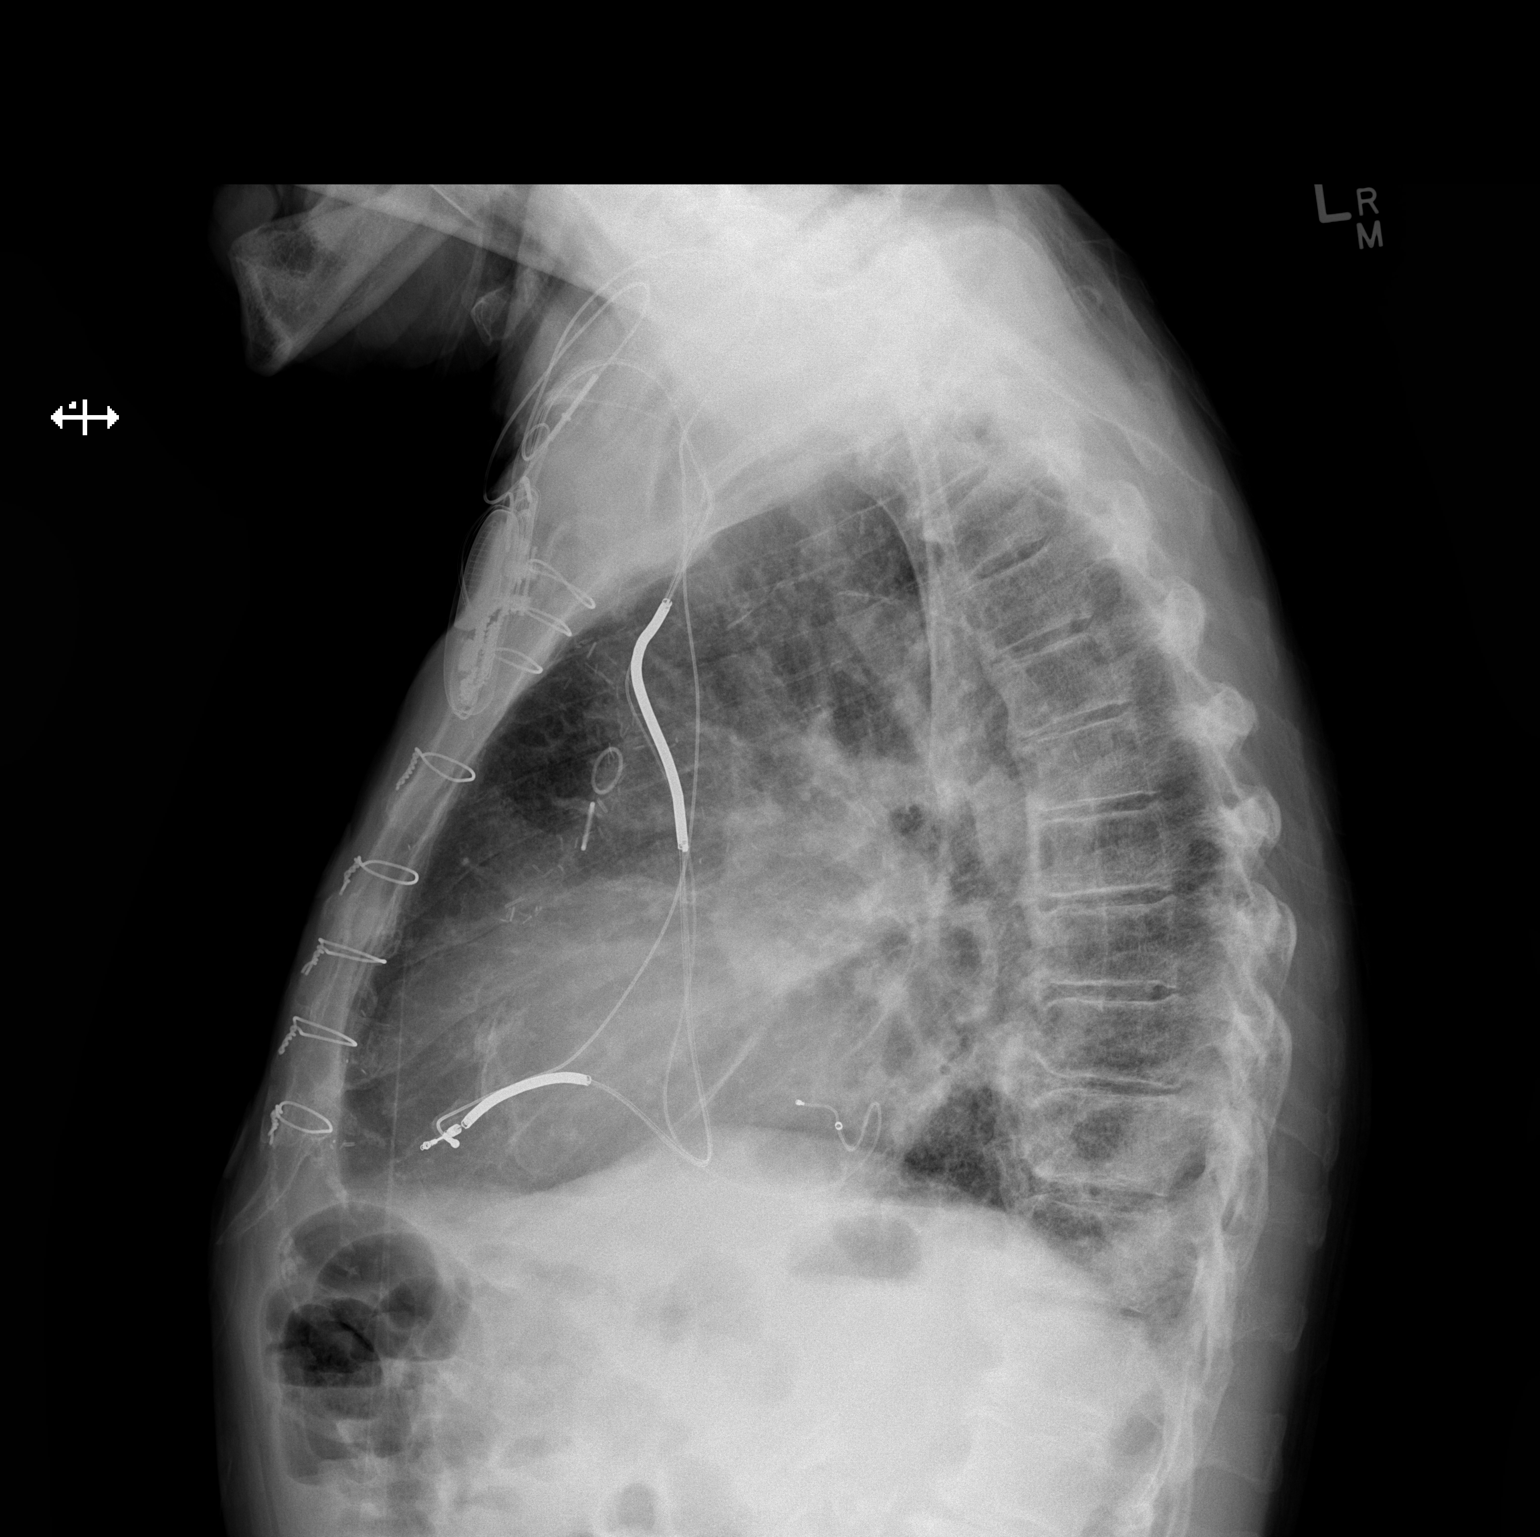

[2 of 2 positions shown; findings below may reference images not displayed]

FINDINGS: No pneumothorax. Cardiomegaly. Bilateral pulmonary opacities could
represent developing infiltrate or edema. AICD device is identified.
No other acute abnormalities are seen.
IMPRESSION: Cardiomegaly. Bilateral pulmonary opacities. The pulmonary opacities
are not specific based on imaging. History of shortness of breath
with leg swelling and no cough suggests edema. Developing infiltrate
not excluded on this study. Recommend clinical correlation and
follow-up to resolution.

## 2018-03-04 IMAGING — DX DG CHEST 2V
2 series · 2 of 2 positions shown · non-contrast
Comparison: 07/24/2016, 07/06/2016 and earlier, including CT chest
08/07/2013.

CLINICAL DATA: 84-year-old with bilateral lower extremity edema and
shortness of breath which began 3 days ago. Current history of black
lung disease. Personal history of prostate cancer and lung cancer.
Oxygen dependent COPD.

EXAM:
CHEST  2 VIEW

[chest lat]
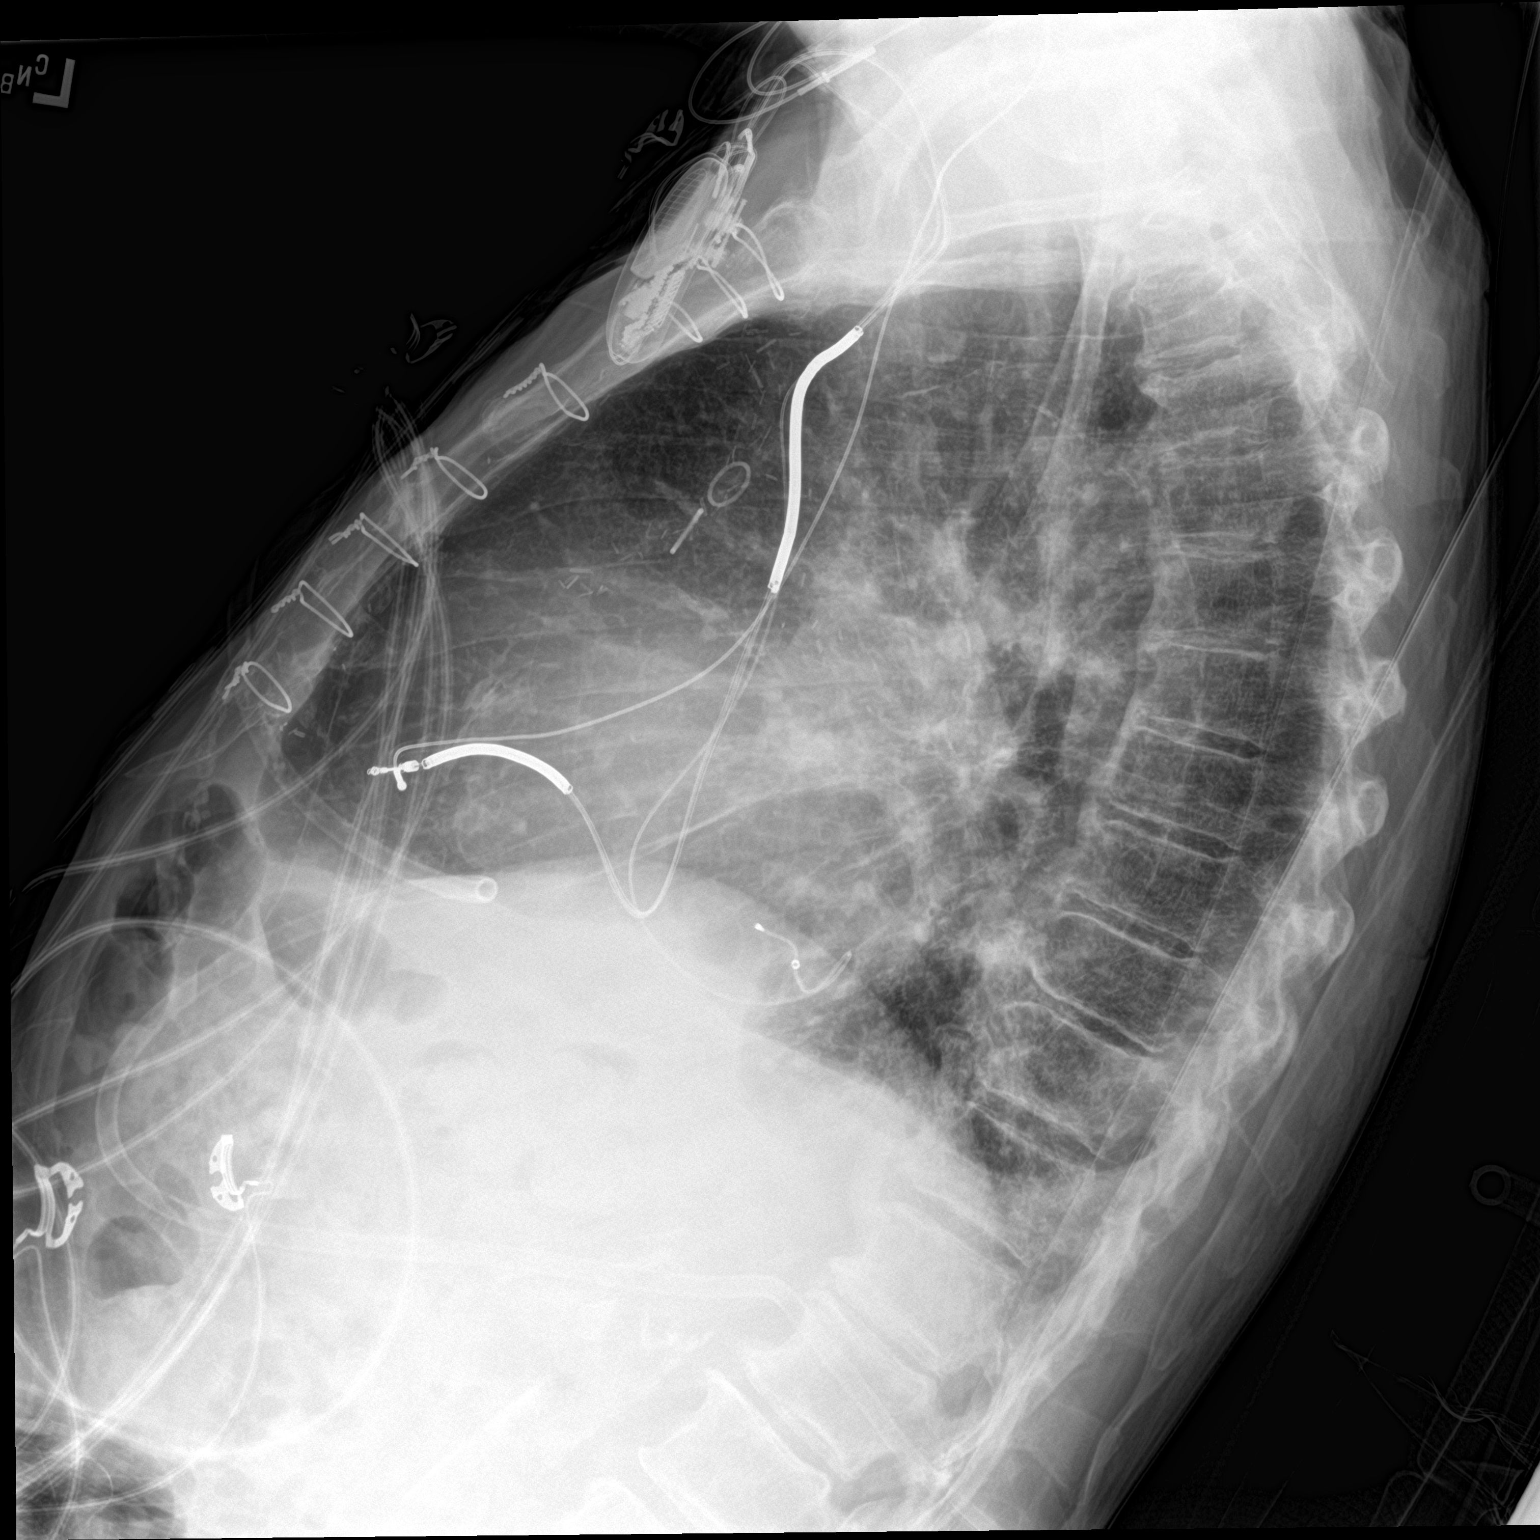

[chest ap]
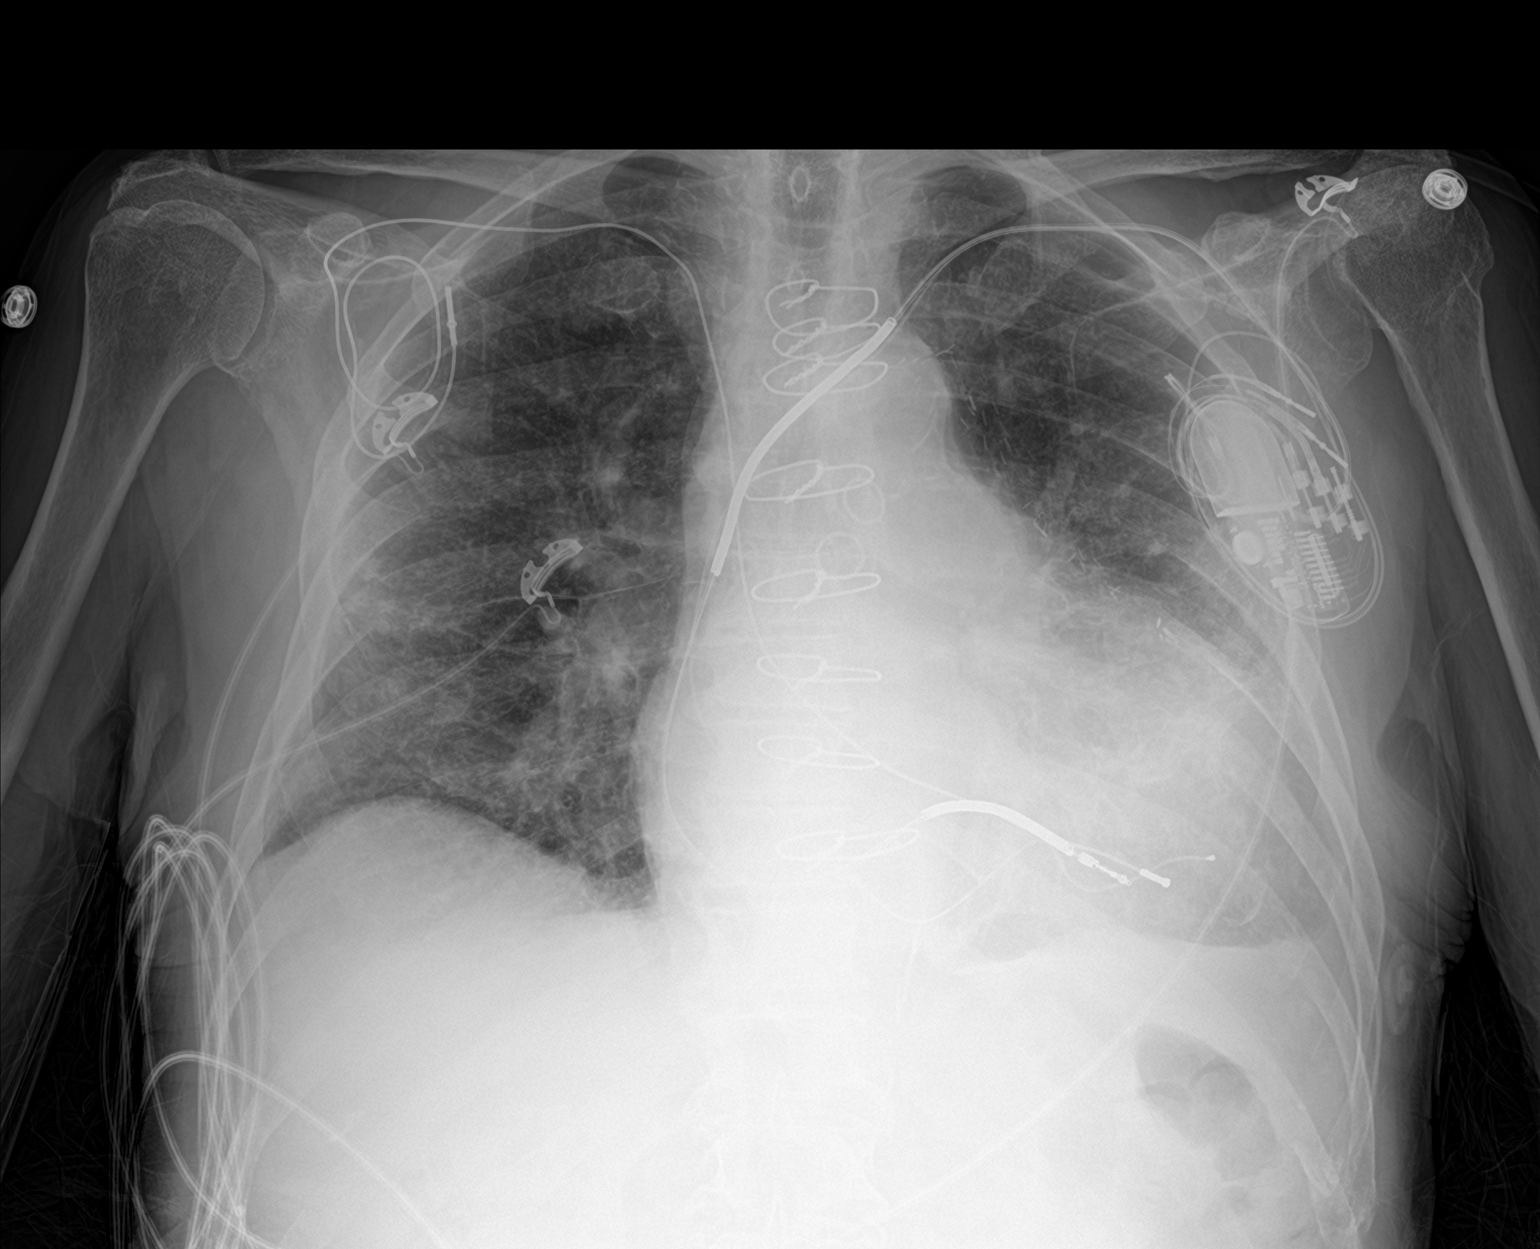

[2 of 2 positions shown; findings below may reference images not displayed]

FINDINGS: Sternotomy for CABG. Left subclavian biventricular pacing
defibrillator unchanged and appears intact. Residual right
subclavian ventricular lead unchanged. Cardiac silhouette moderately
to markedly enlarged, unchanged. Thoracic aorta atherosclerotic,
unchanged. Hilar and mediastinal contours otherwise unremarkable.
Mild diffuse interstitial pulmonary edema and small bilateral
pleural effusions, increased since the examination 10 days ago. No
confluent airspace consolidation. Degenerative changes involving the
thoracic and upper lumbar spine.
IMPRESSION: Mild CHF and small bilateral pleural effusions, worse than on the
examination 10 days ago.

## 2018-03-18 IMAGING — DX DG CHEST 2V
2 series · 2 of 2 positions shown · non-contrast
Comparison: August 03, 2016 and March 21, 2016

CLINICAL DATA: Hypertension. Cardiac arrhythmia. Intermittent
shortness of breath with exertion

EXAM:
CHEST  2 VIEW

[chest pa]
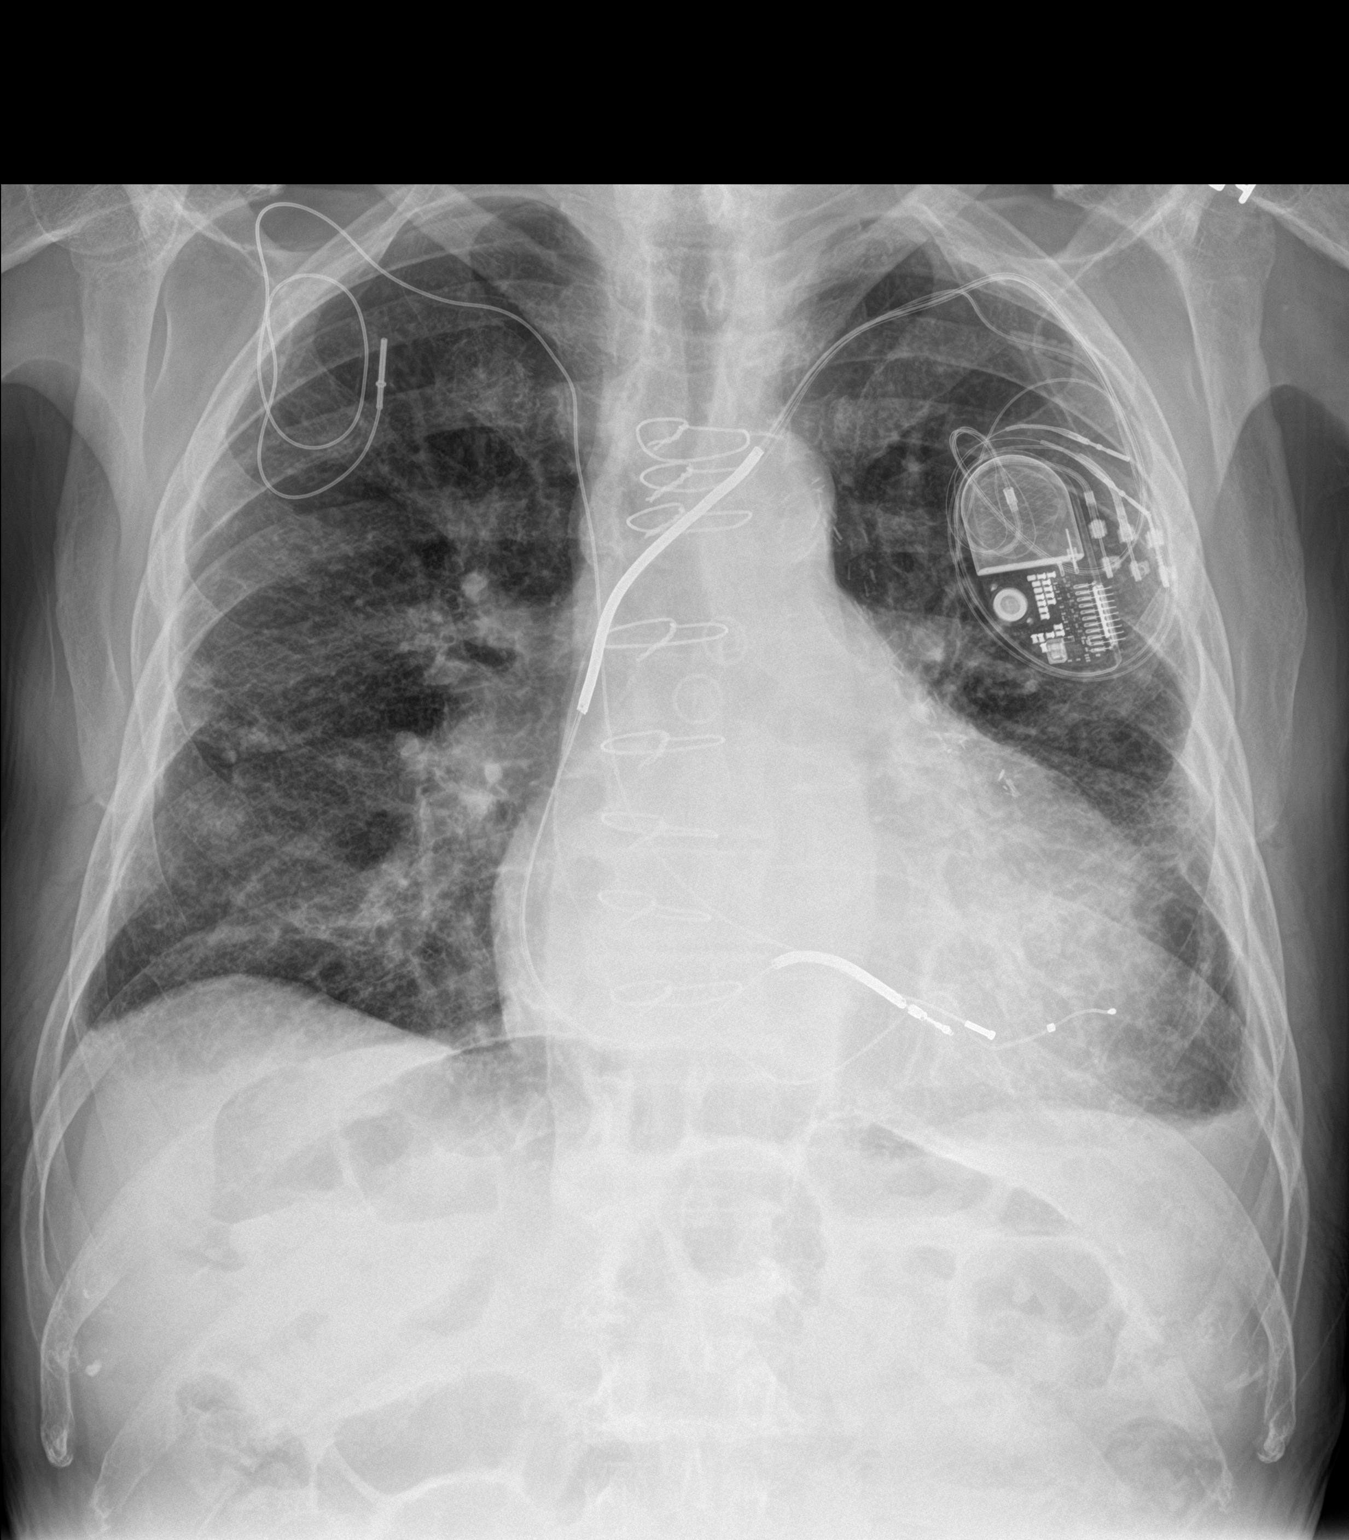

[chest lat]
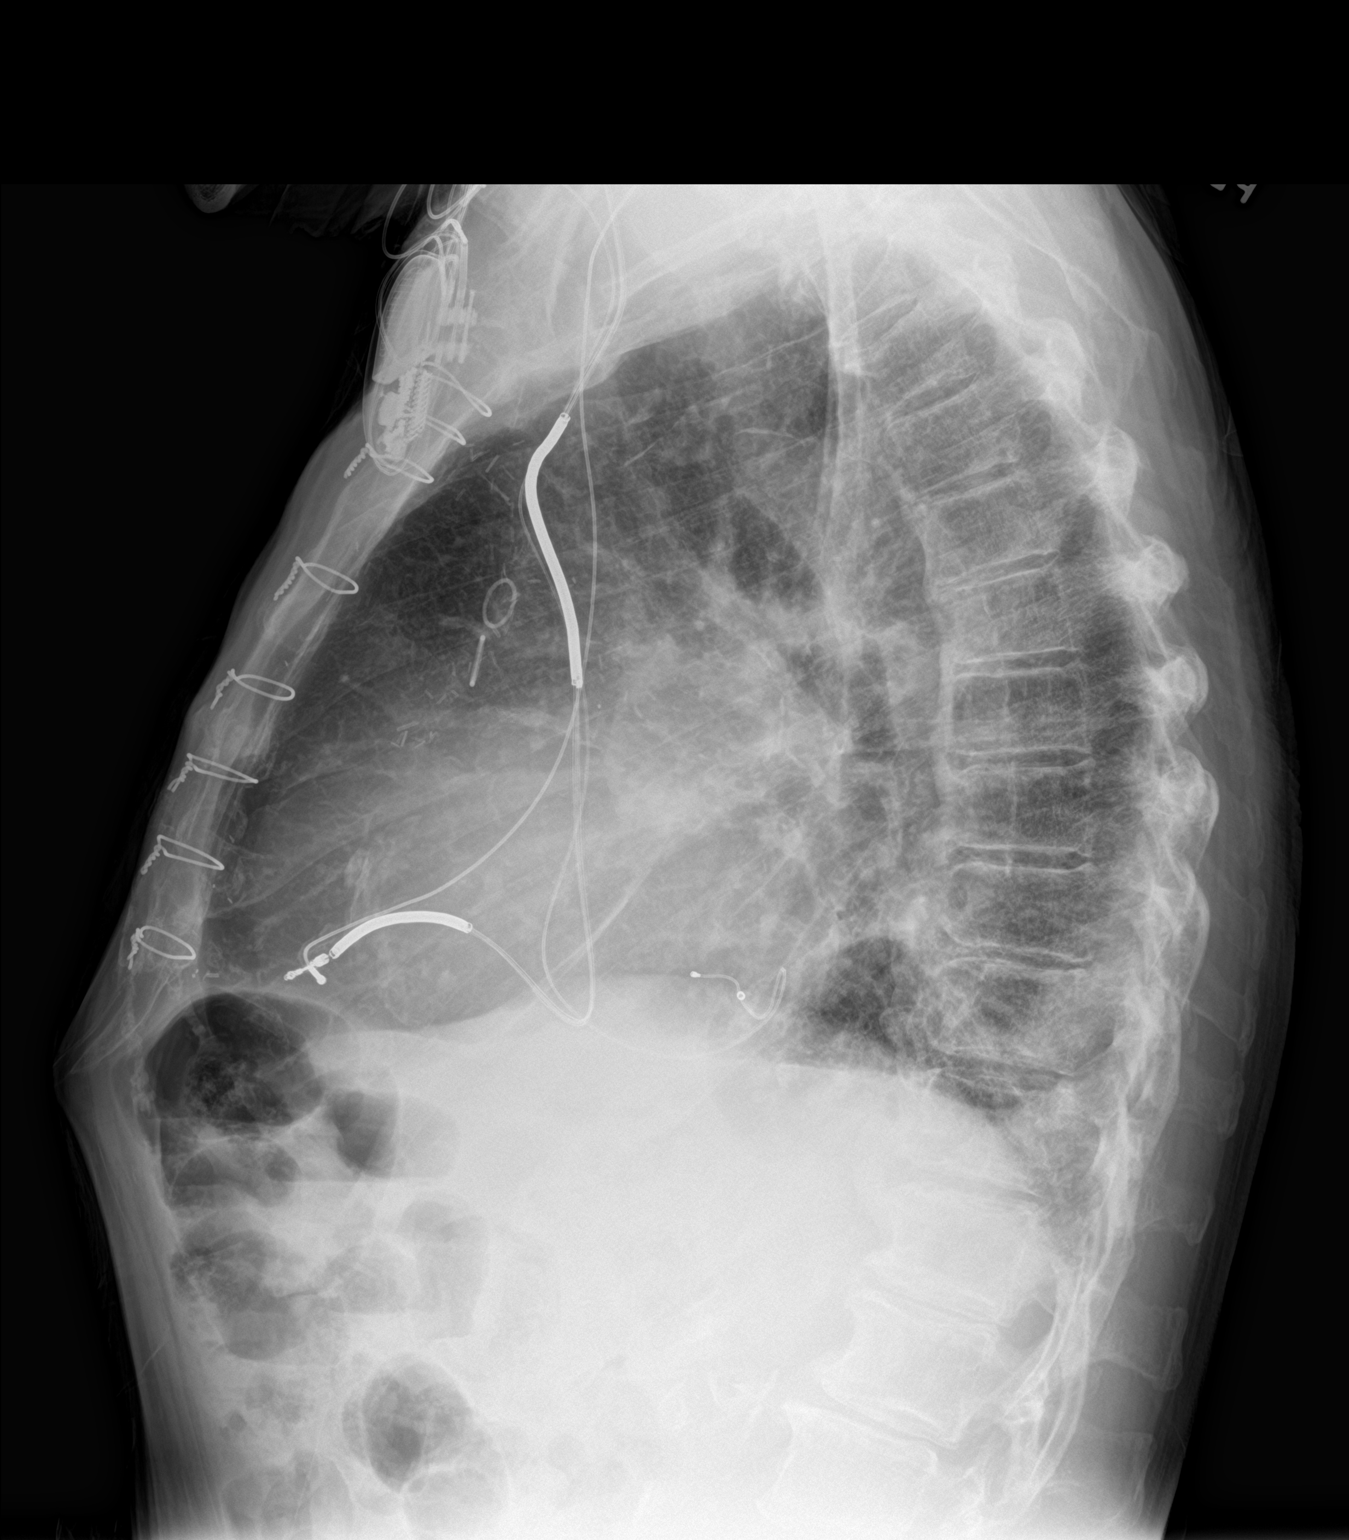

[2 of 2 positions shown; findings below may reference images not displayed]

FINDINGS: There remains interstitial pulmonary edema. There is a persistent
small left pleural effusion. There is no airspace consolidation.
There is mild scarring in the left base. There is cardiomegaly with
mild pulmonary venous hypertension. There is aortic atherosclerosis.
Patient is status post coronary artery bypass grafting. Pacemaker
leads are attached to the right atrium, right ventricle, and
coronary sinus. There is a lead from the right side attached the
right ventricle which does not connect with a pacemaker device, a
stable finding. No evident bone lesions. No evident adenopathy.
IMPRESSION: Evidence of a degree of chronic congestive heart failure, stable. No
airspace consolidation. Stable cardiac silhouette. Pacemaker leads
unchanged. There is aortic atherosclerosis.

## 2018-06-08 IMAGING — CT CT HEAD W/O CM
4 series · 17 of 47 positions shown, 19 images · non-contrast
Comparison: 01/20/2016, 06/04/2013

CLINICAL DATA: Generalize weakness for 2 weeks, syncopal episode

EXAM:
CT HEAD WITHOUT CONTRAST
TECHNIQUE: Contiguous axial images were obtained from the base of the skull
through the vertex without intravenous contrast.

[Series 3: head wo · axial · 0.40mm/px · z∈[-110,+10]mm · 7 of 34 slices shown, 9 images]
[im 5/34  brain]
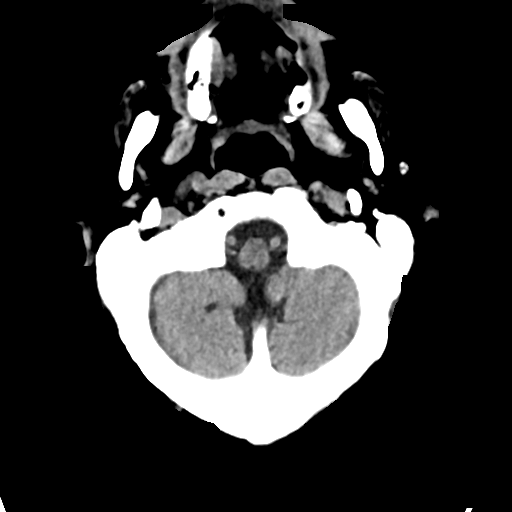
[im 5/34  bone]
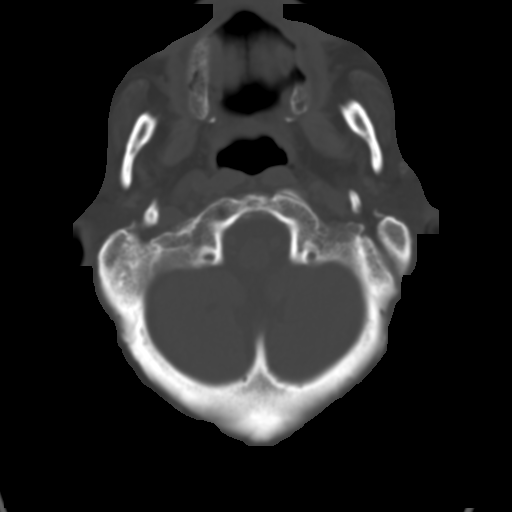
[im 9/34  brain]
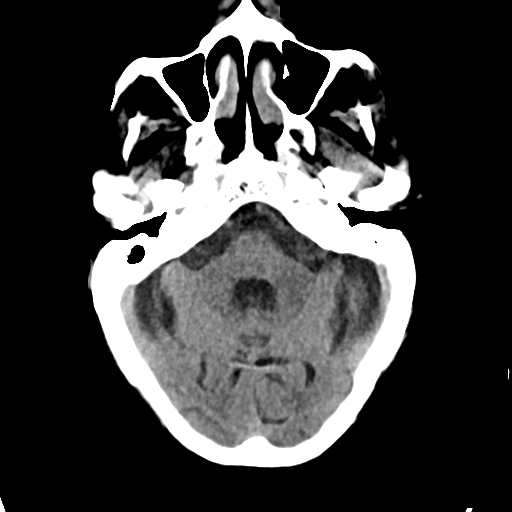
[im 13/34  brain]
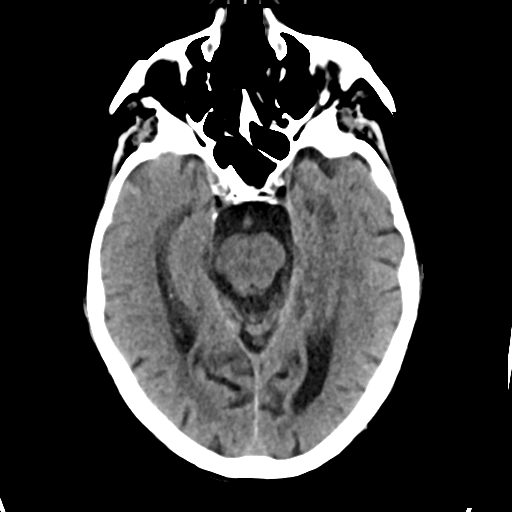
[im 17/34  brain]
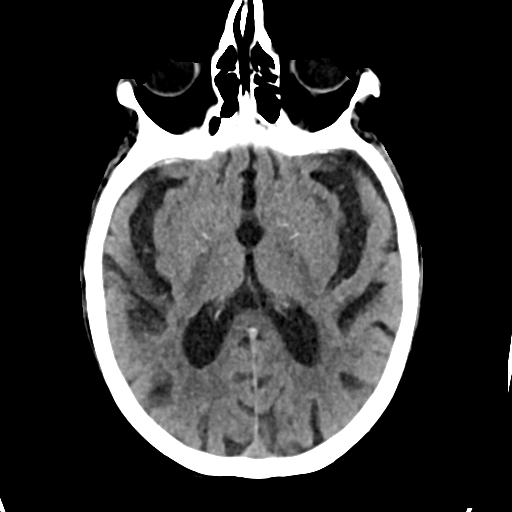
[im 21/34  brain]
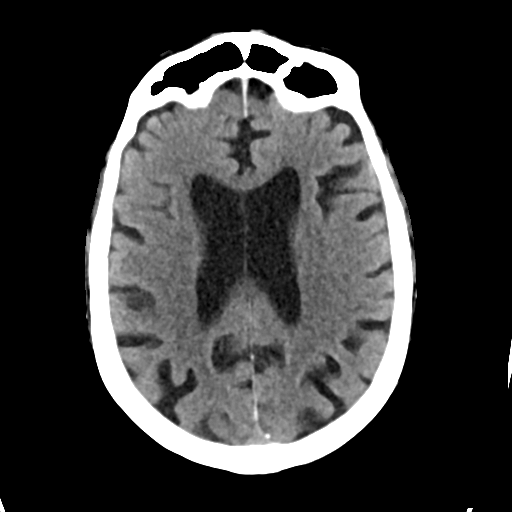
[im 21/34  bone]
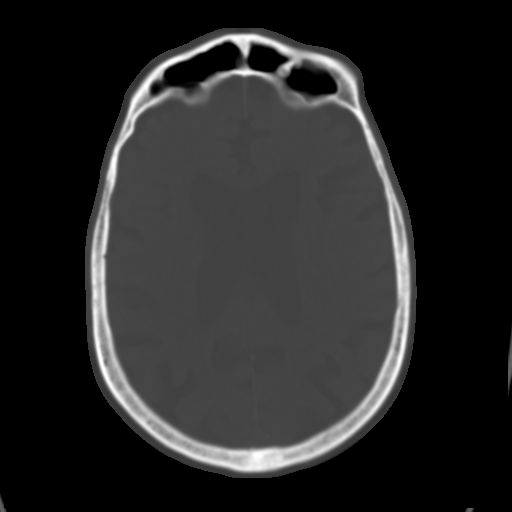
[im 25/34  brain]
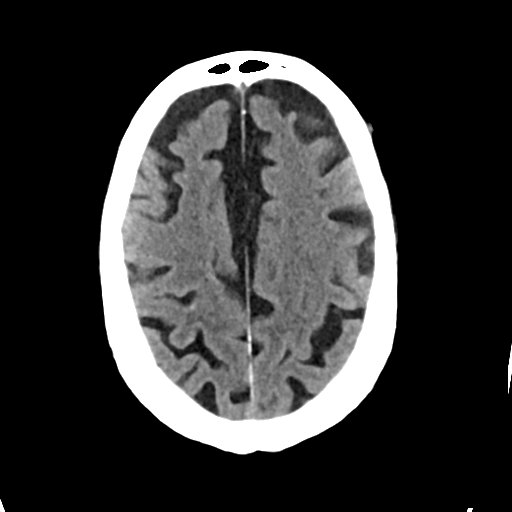
[im 29/34  brain]
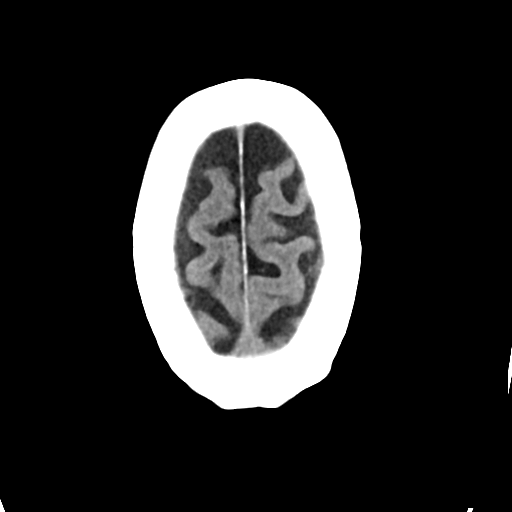

[Series 4: head bone · axial · 0.40mm/px · z∈[-114,-56]mm · 4 of 85 slices shown]
[im 9/85  bone]
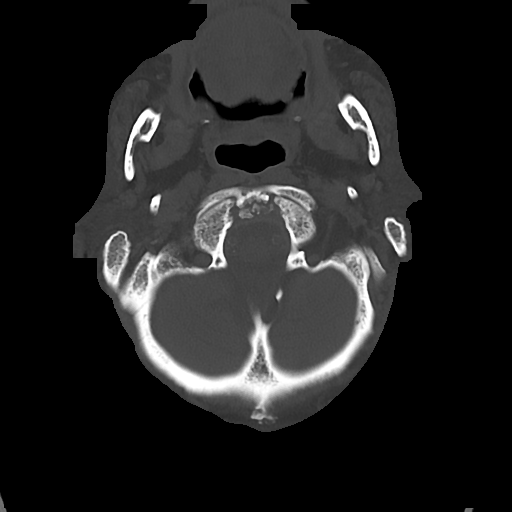
[im 17/85  bone]
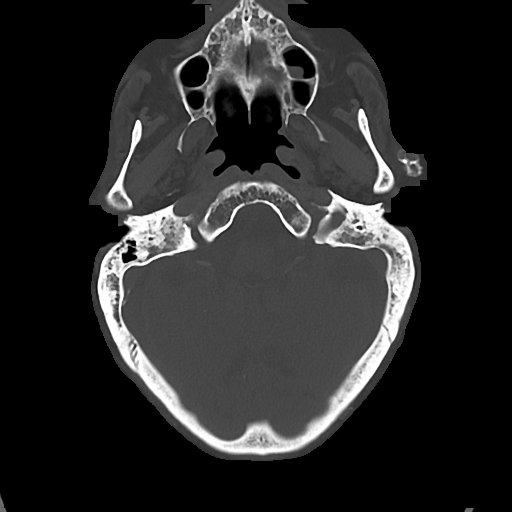
[im 26/85  bone]
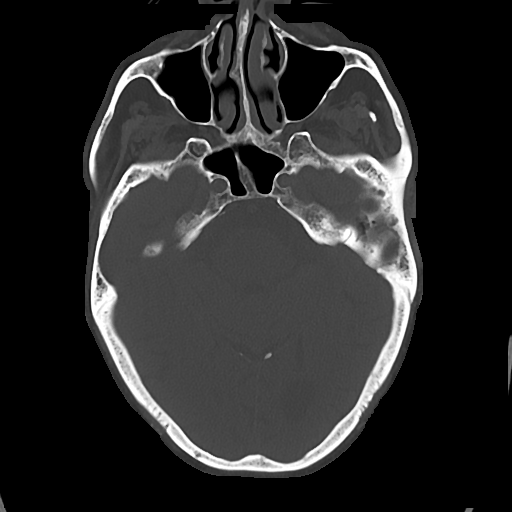
[im 38/85  bone]
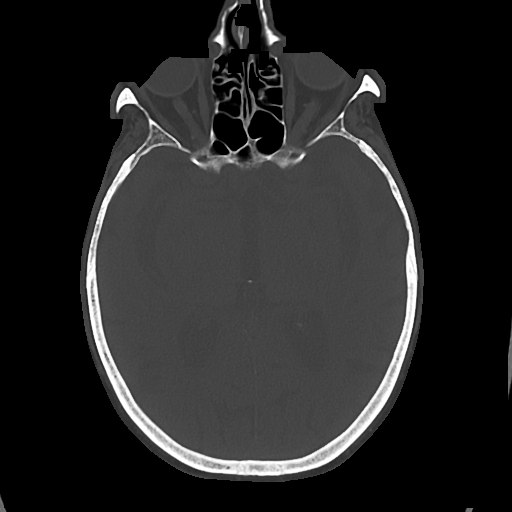

[Series 5: cor soft · coronal · 0.31mm/px · 3 of 70 slices shown]
[im 24/70  brain]
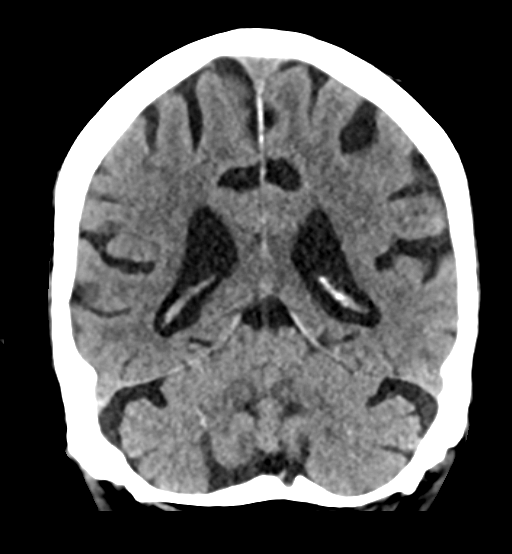
[im 31/70  brain]
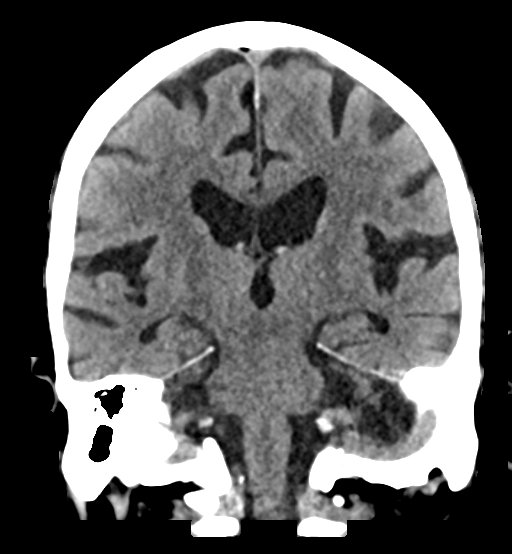
[im 39/70  brain]
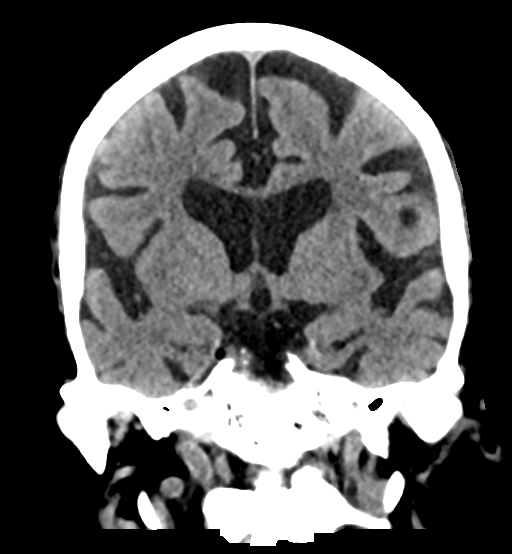

[Series 6: sag soft · sagittal · 0.33mm/px · 3 of 54 slices shown]
[im 18/54  brain]
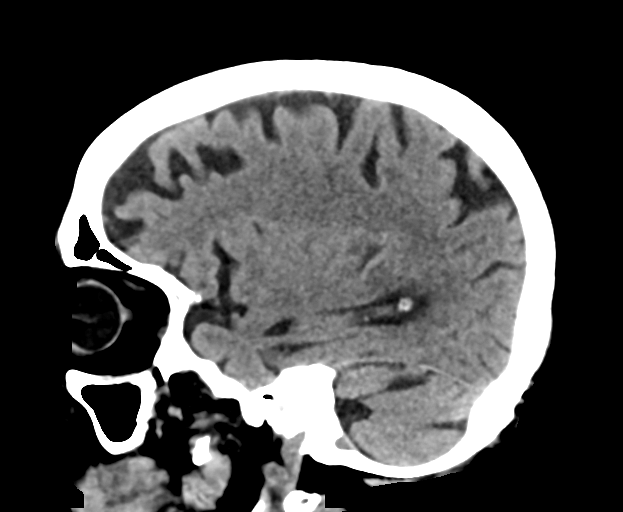
[im 27/54  brain]
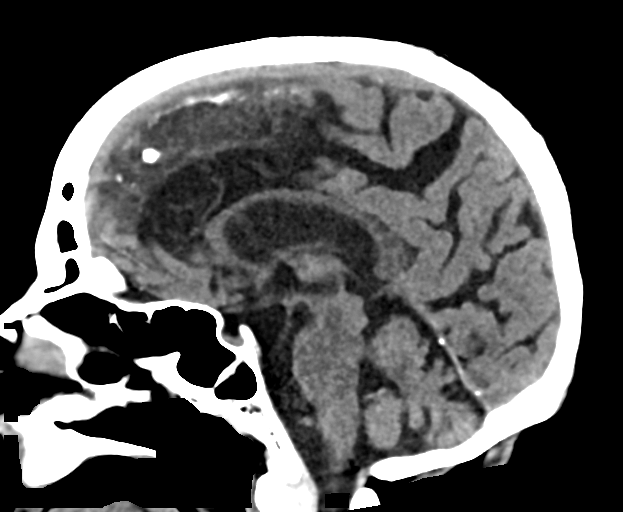
[im 36/54  brain]
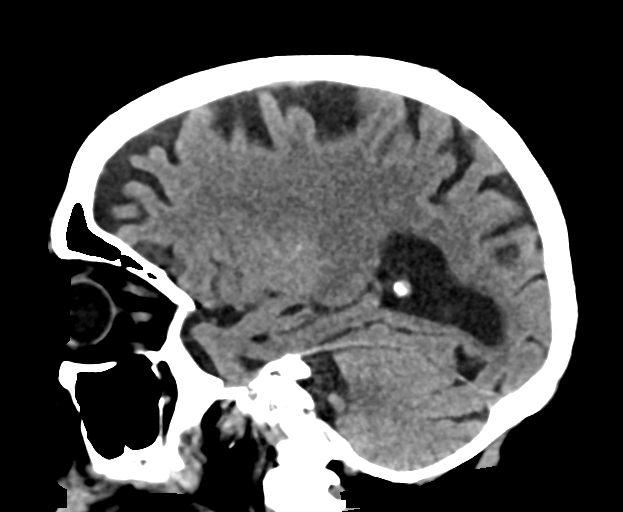

[17 of 47 positions shown; findings below may reference images not displayed]

FINDINGS: Brain: No acute territorial infarction, hemorrhage, or intracranial
mass is seen. Small old infarct in the right cerebellum. Atrophy and
mild small vessel ischemic changes of the white matter. Stable
ventricle size.

Vascular: No hyperdense vessels.  Carotid artery calcifications.

Skull: No fracture or suspicious bone lesion.

Sinuses/Orbits: Mild mucosal thickening in the ethmoid sinuses. No
acute orbital abnormality. Bilateral lens extraction.

Other: Stable coarse calcification in the left parotid space.
IMPRESSION: 1. No CT evidence for acute intracranial abnormality.
2. Atrophy and mild small vessel ischemic changes of the white
matter

## 2018-06-08 IMAGING — CR DG ELBOW COMPLETE 3+V*L*
4 series · 4 of 4 positions shown · non-contrast
Comparison: None.

CLINICAL DATA: Syncope today with abrasion of the left elbow after
fall.

EXAM:
LEFT ELBOW - COMPLETE 3+ VIEW

[elbow ap]
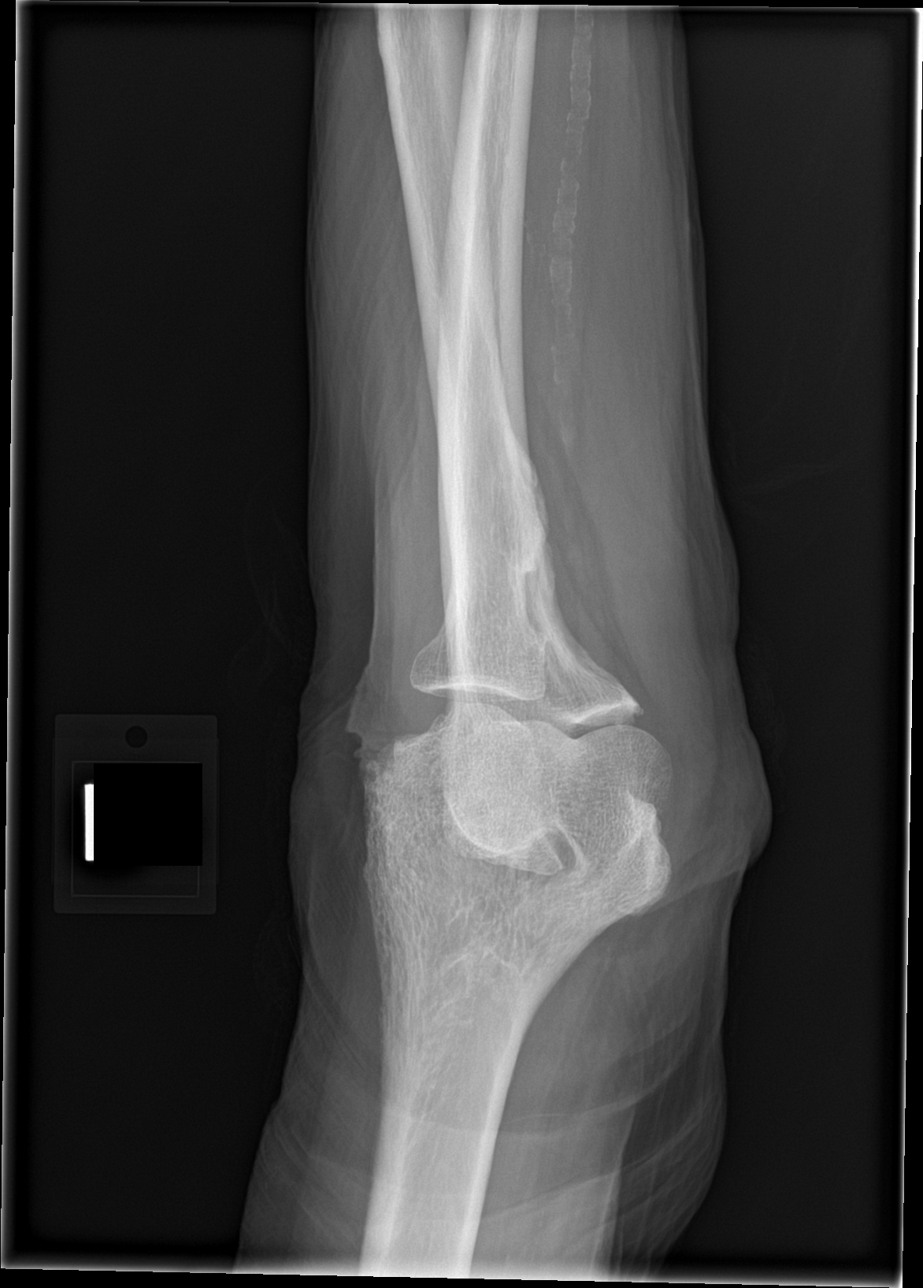

[elbow obl (1 of 2)]
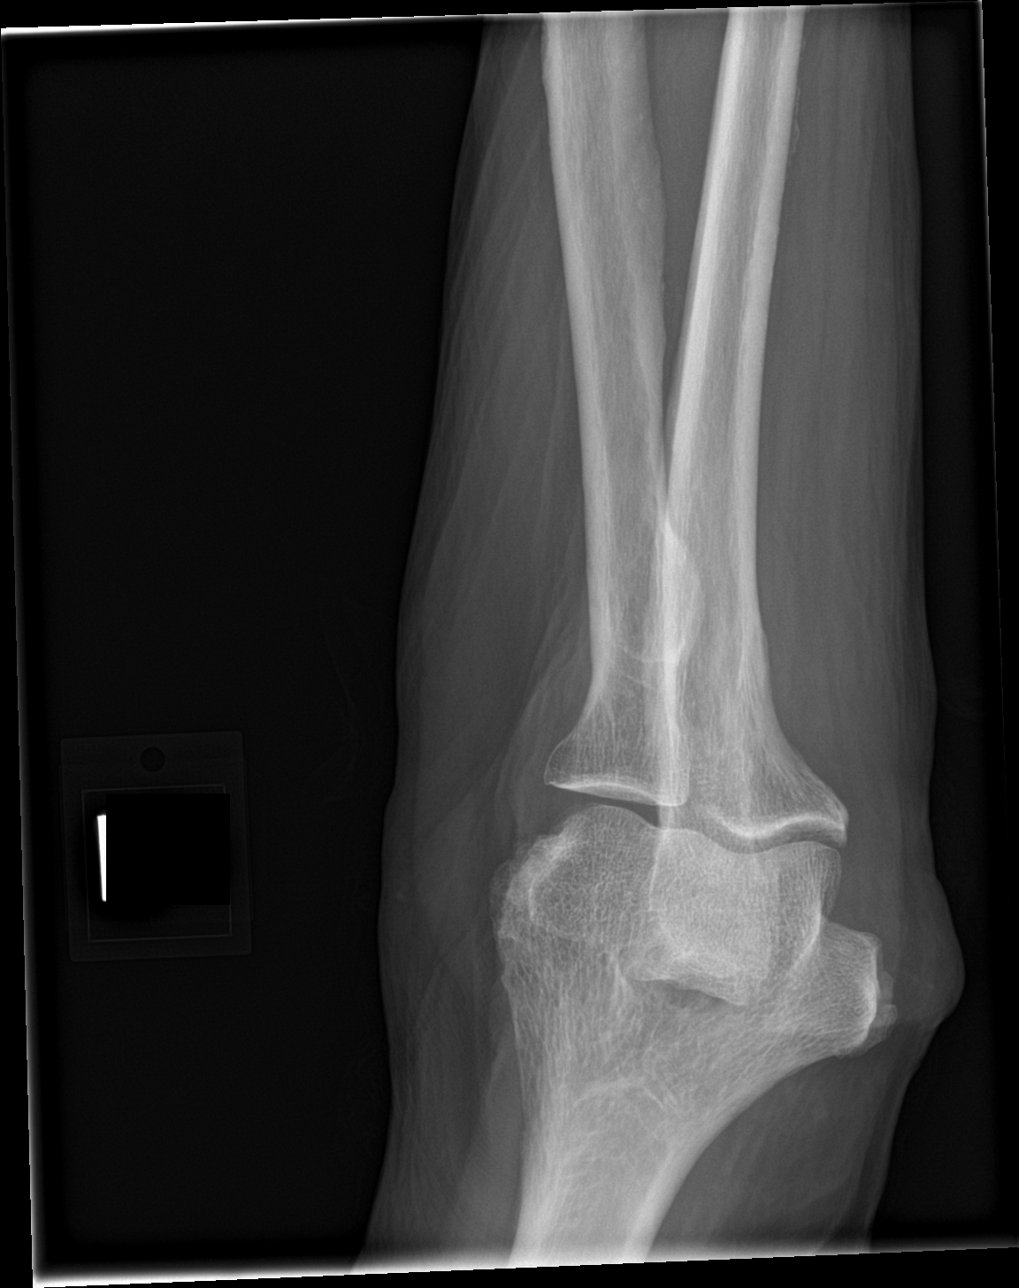

[elbow obl (2 of 2)]
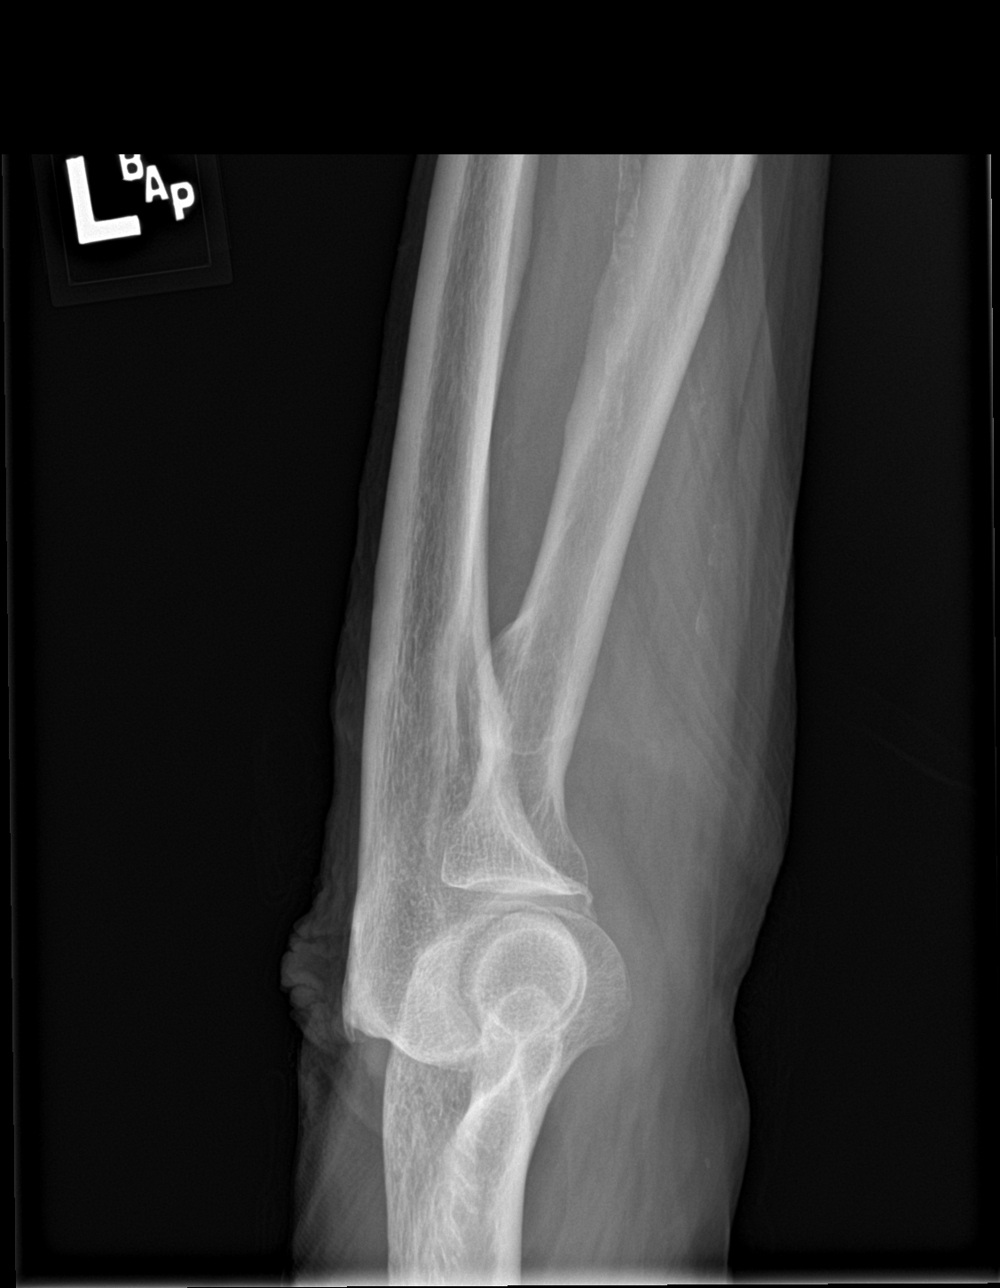

[elbow lat]
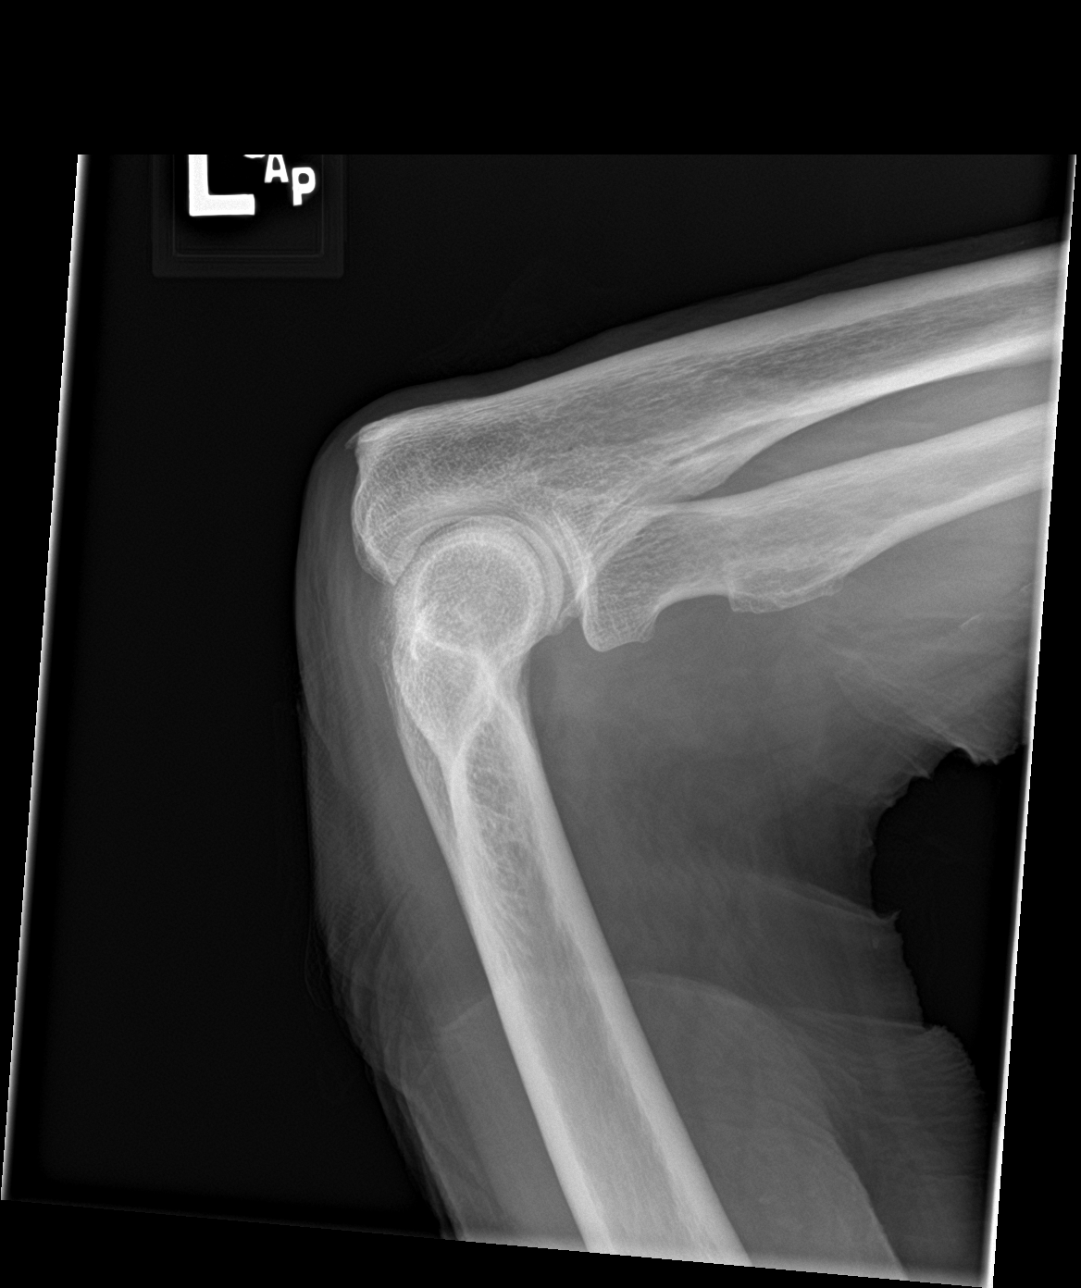

[4 of 4 positions shown; findings below may reference images not displayed]

FINDINGS: There is no evidence of acute fracture, dislocation, or joint
effusion. Small enthesophyte at the triceps insertion. Mild soft
tissue swelling is noted posteriorly. There is no evidence of
arthropathy or other focal bone abnormality. Soft
IMPRESSION: No acute fracture or dislocation of the left elbow. Mild dorsal soft
tissue swelling with tiny olecranon spur.

## 2018-06-08 IMAGING — CR DG HIP (WITH OR WITHOUT PELVIS) 2-3V*L*
3 series · 3 of 3 positions shown · non-contrast
Comparison: None.

CLINICAL DATA: Syncopal episode this morning. Left buttock an upper
left femoral pain.

EXAM:
DG HIP (WITH OR WITHOUT PELVIS) 2-3V LEFT

[pelvis ap]
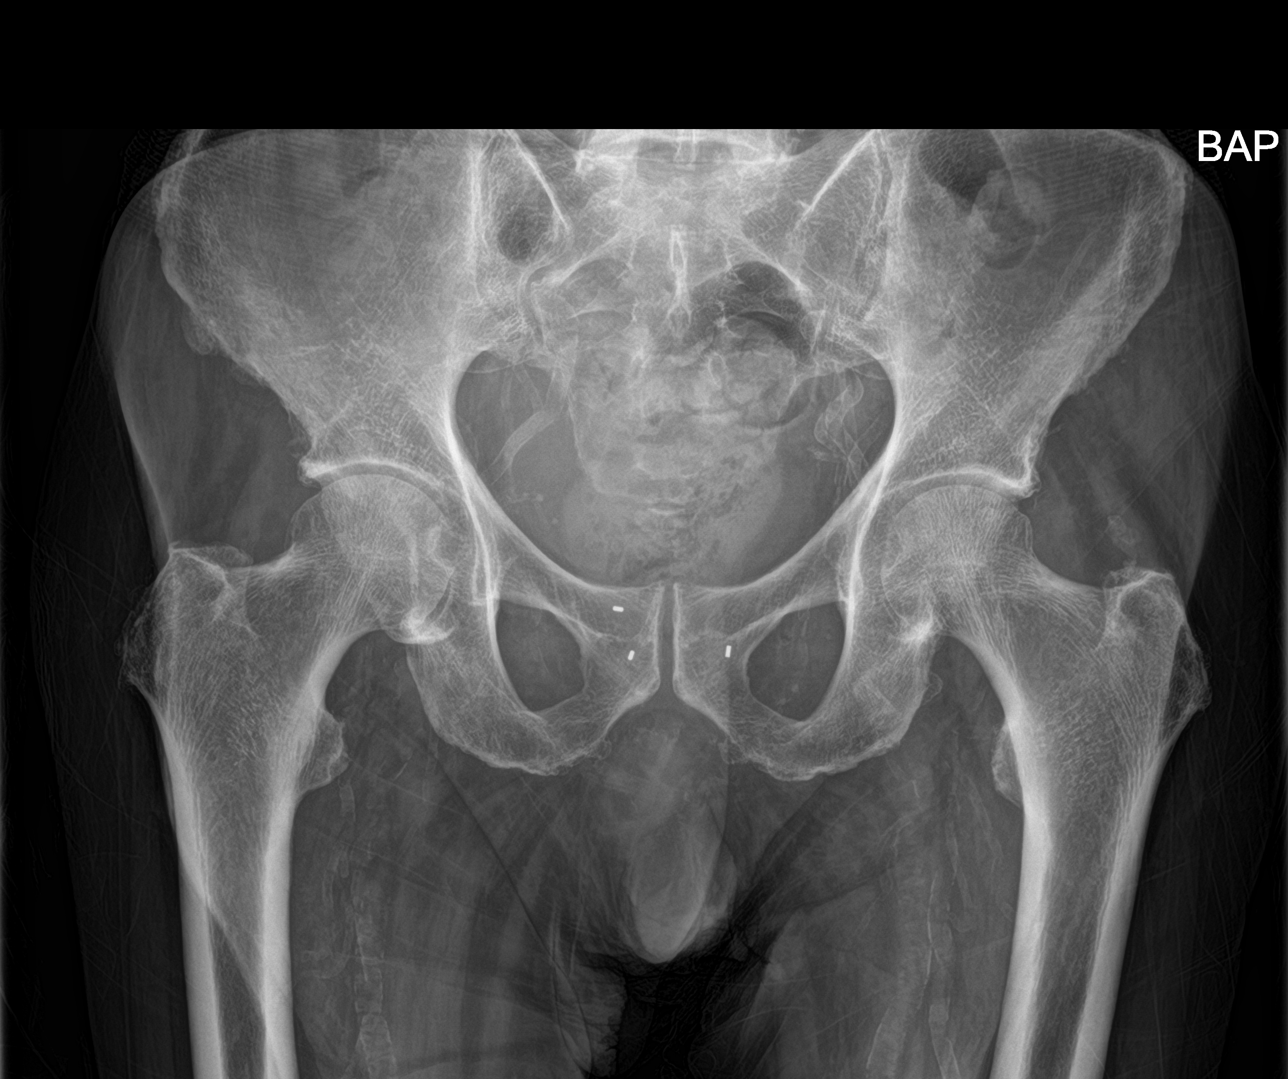

[hip ap]
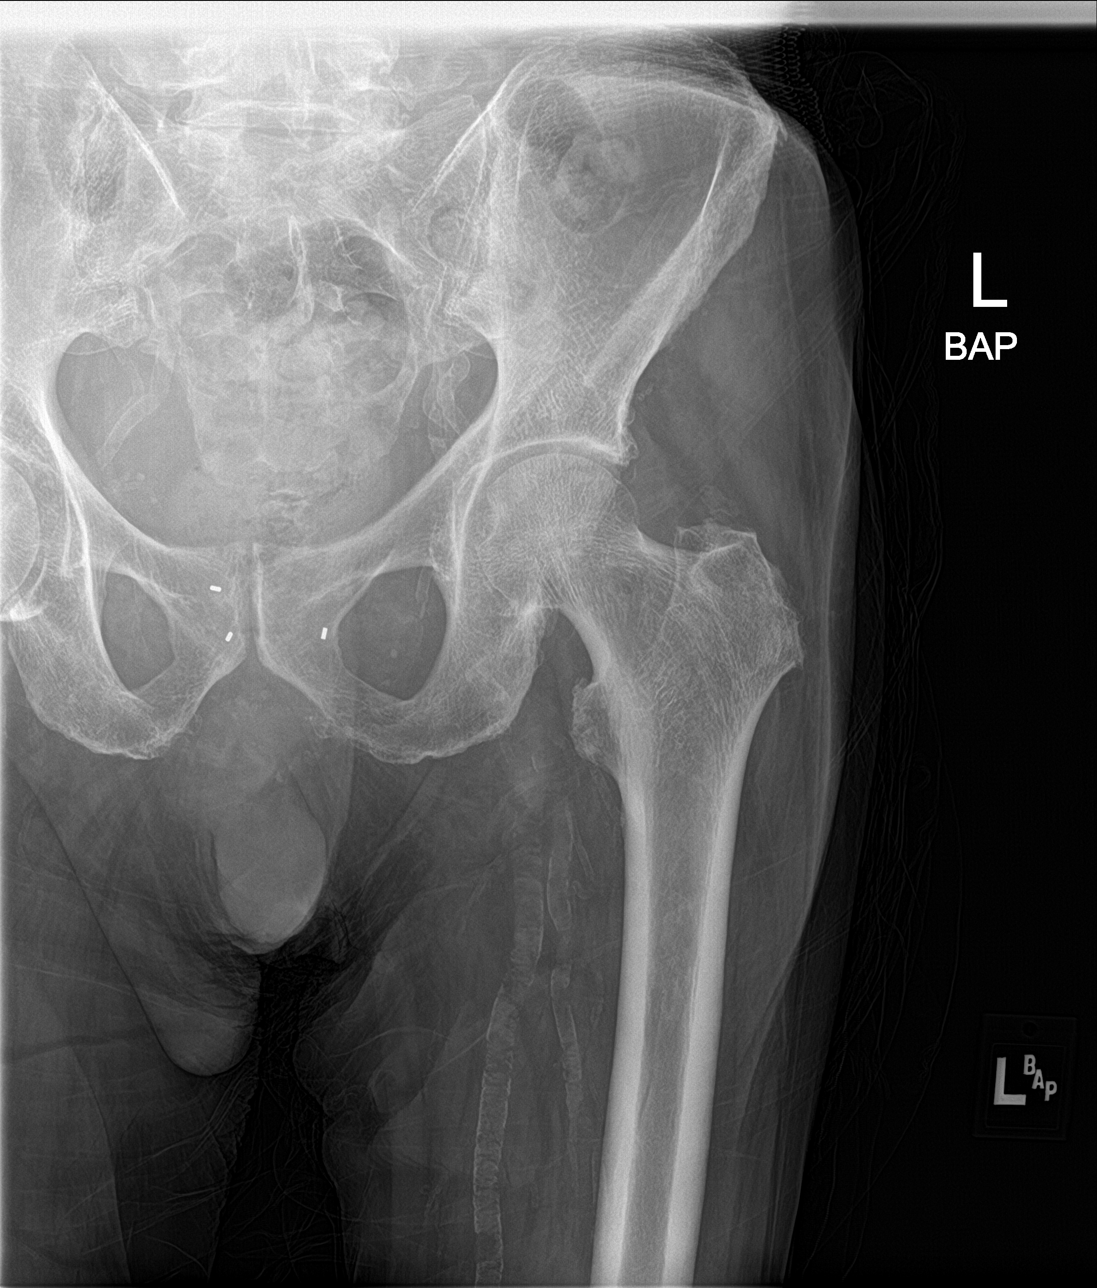

[hip lat]
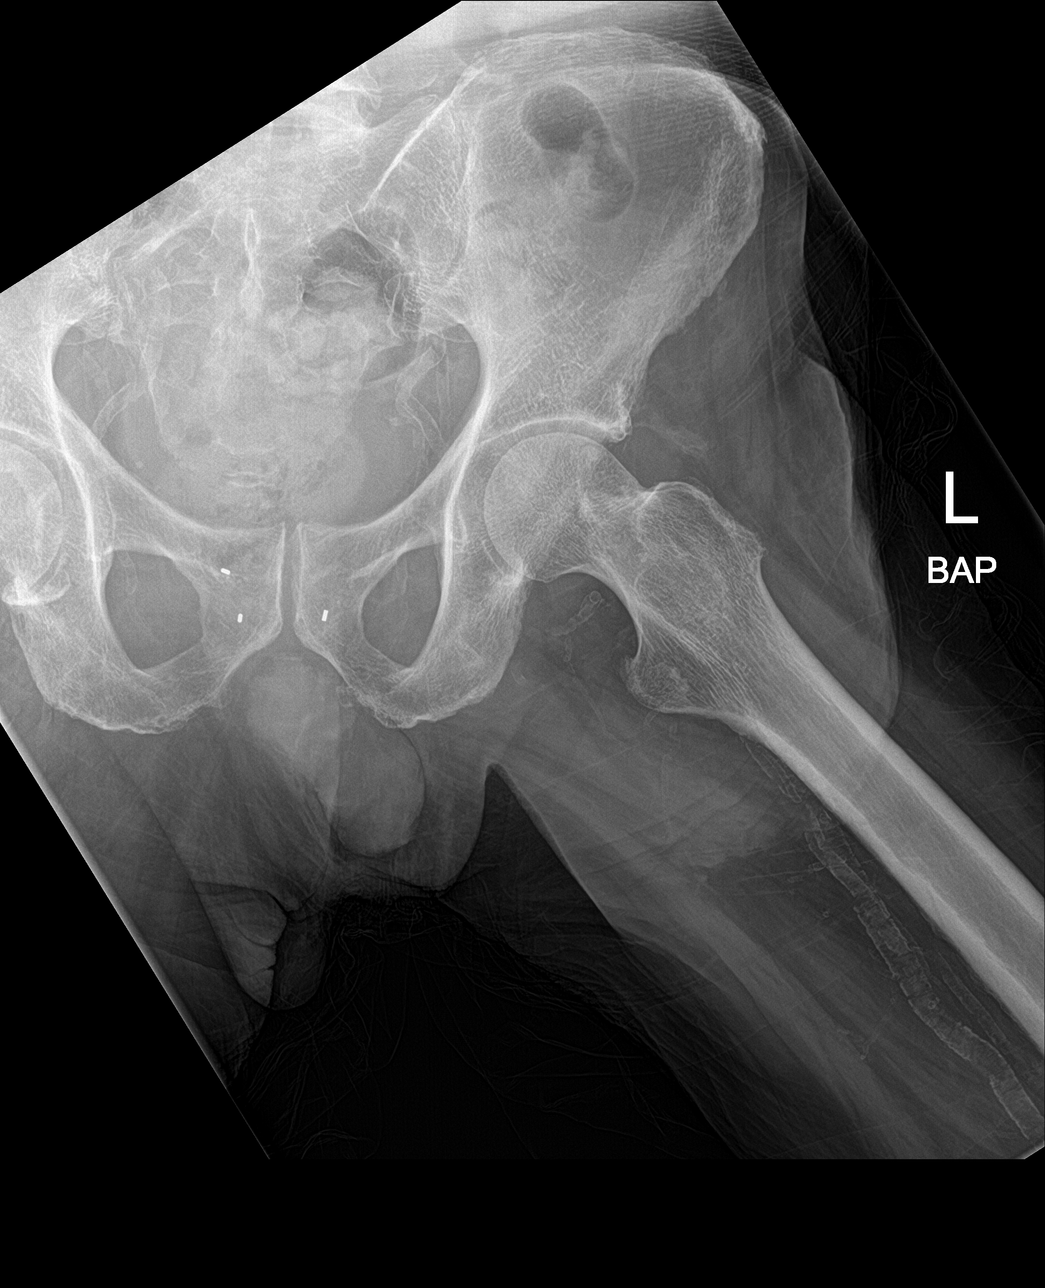

[3 of 3 positions shown; findings below may reference images not displayed]

FINDINGS: Eccentric disc space narrowing is noted at L4-5 with disc space
narrowing on the right. The bony pelvis appears intact. No pelvic
diastasis. Three surgical clips project over the pubic symphysis.
Hip joints are maintained bilaterally. No acute fracture nor
dislocations. Soft tissue ossifications are present adjacent to the
greater trochanter which may reflect stigmata of gluteal
tendinopathy. Extensive vascular calcifications seen bilaterally
along the course the internal and external iliacs through femoral
and branch vessel arteries.
IMPRESSION: No acute osseous abnormality of the bony pelvis and left hip.

## 2018-06-08 IMAGING — CR DG CHEST 2V
2 series · 2 of 2 positions shown · non-contrast
Comparison: 08/17/2016

CLINICAL DATA: Syncope.

EXAM:
CHEST  2 VIEW

[chest lat]
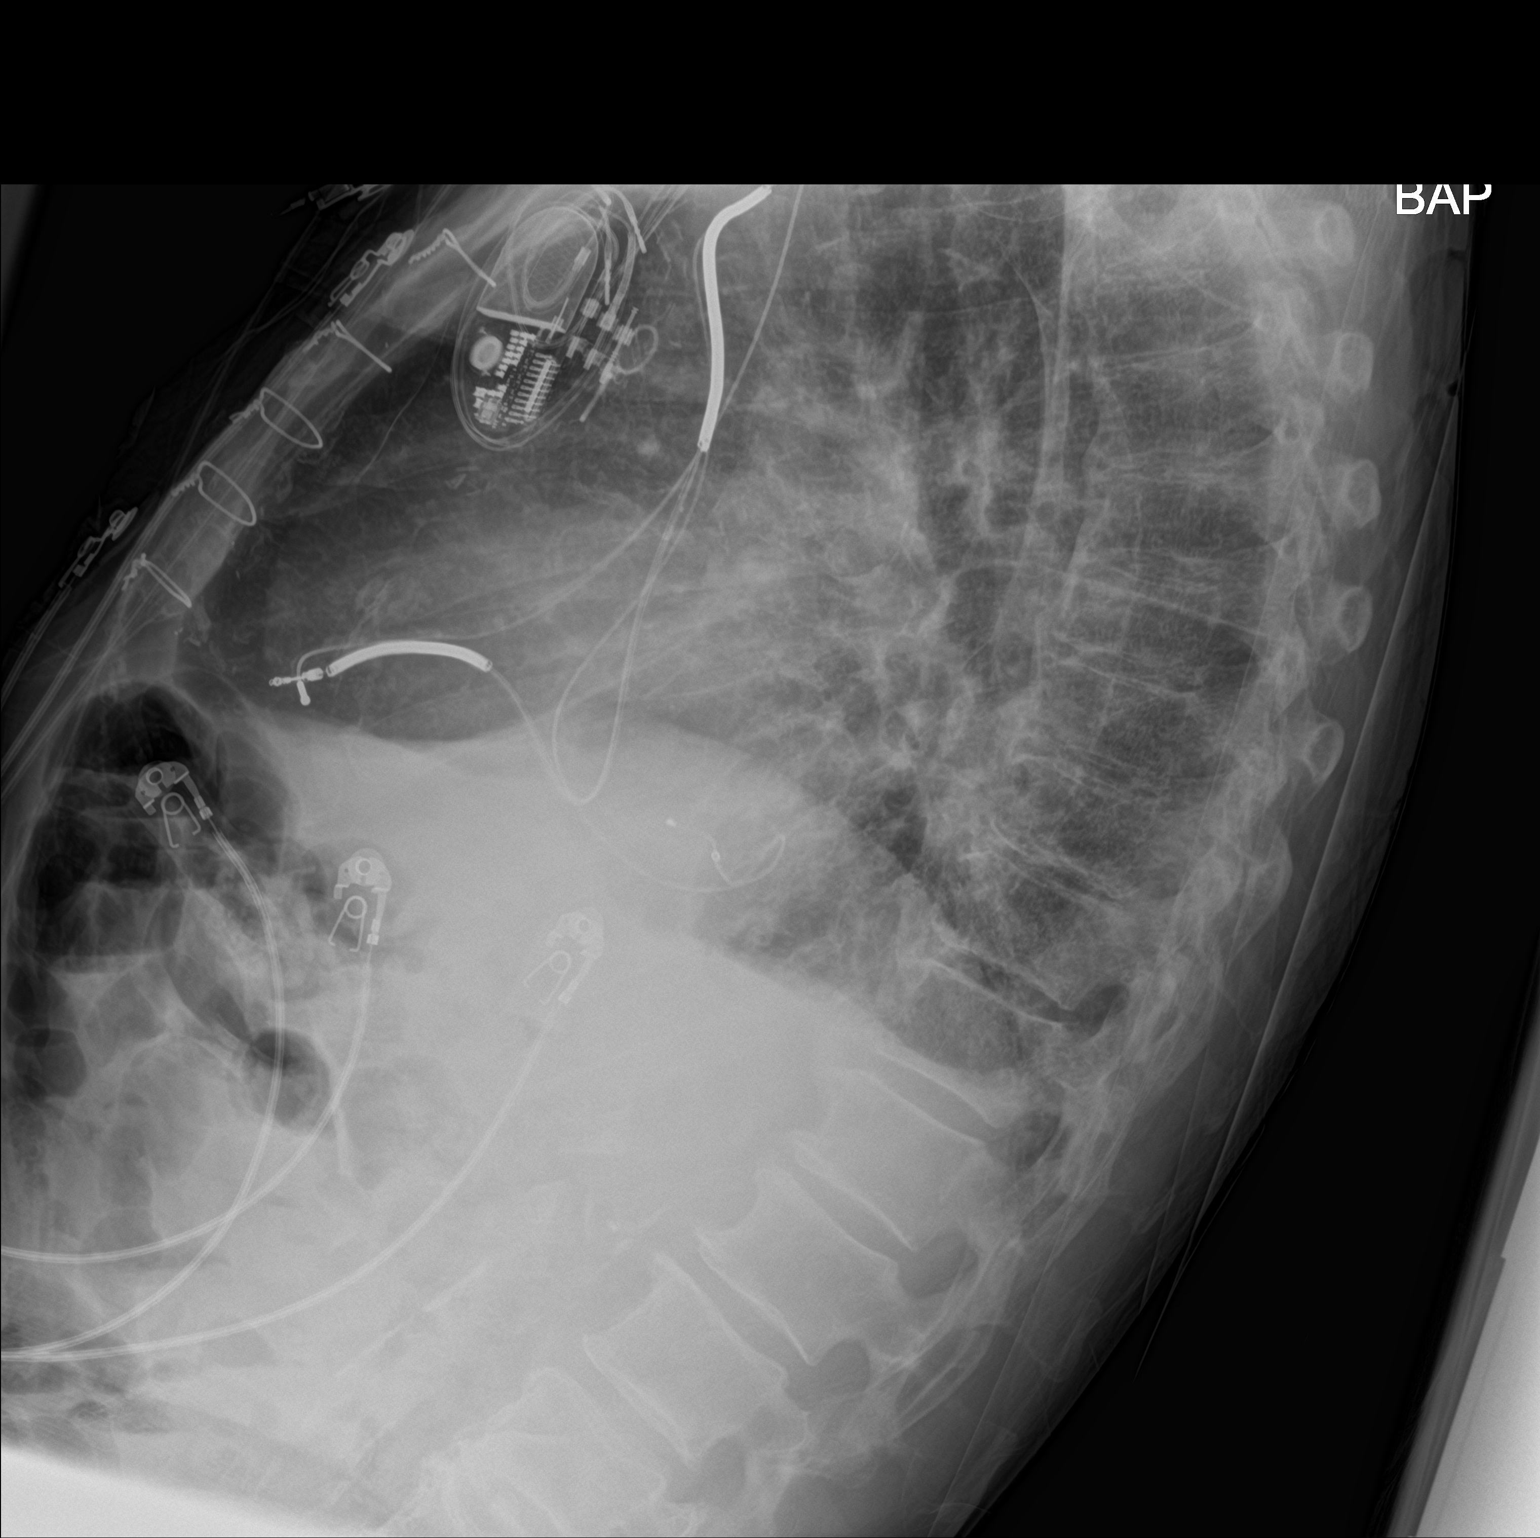

[chest ap]
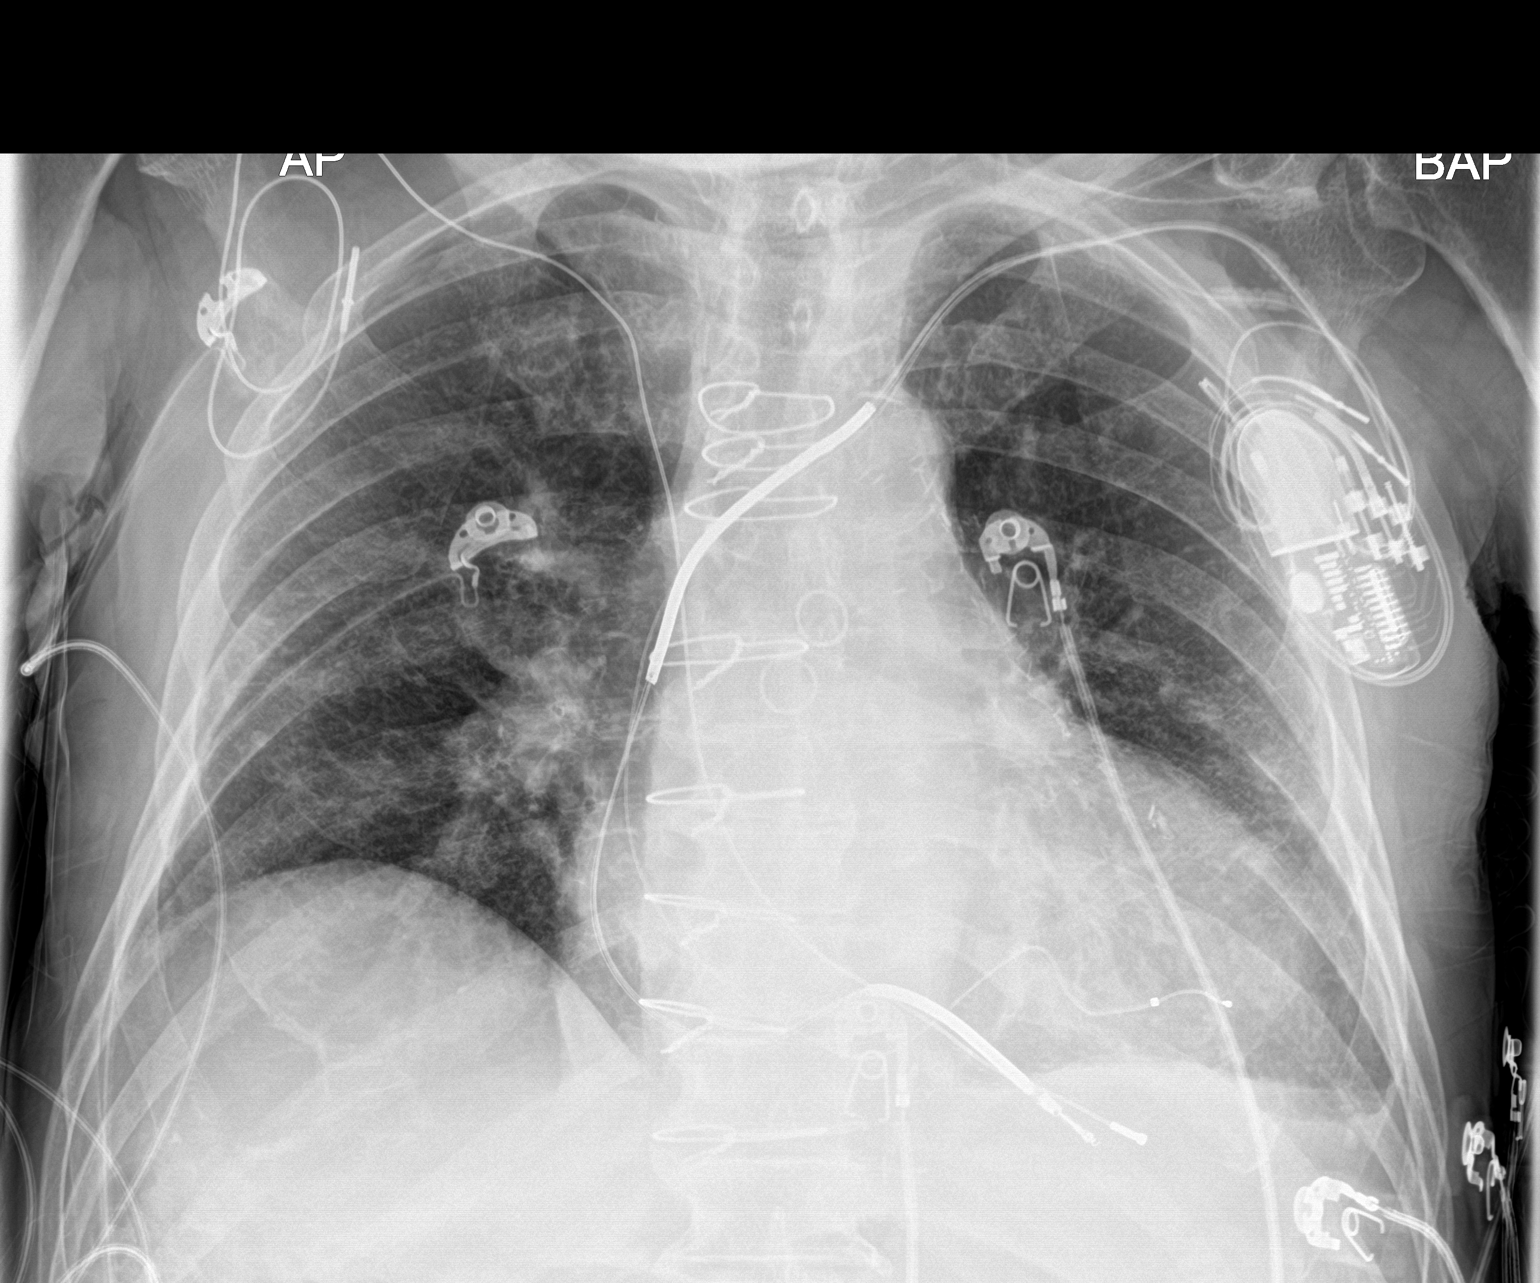

[2 of 2 positions shown; findings below may reference images not displayed]

FINDINGS: Stable postsurgical changes from CABG. Stable positioning of
pacemaker leads.

Cardiomediastinal silhouette is moderately enlarged. Mediastinal
contours appear intact. Calcific atherosclerotic disease and
tortuosity of the aorta.

There is no evidence of focal airspace consolidation, pleural
effusion or pneumothorax. Mild pulmonary vascular congestion.

Osseous structures are without acute abnormality. Soft tissues are
grossly normal.
IMPRESSION: Stably enlarged cardiac silhouette with mild pulmonary vascular
congestion.

No evidence of overt pulmonary edema or focal airspace
consolidation.
